# Patient Record
Sex: Male | Born: 1935 | Race: White | Hispanic: No | Marital: Married | Smoking: Never smoker
Health system: Southern US, Community
[De-identification: ages and names within clinical notes are randomized; demographics above are authoritative.]

## PROBLEM LIST (undated history)

## (undated) DIAGNOSIS — E871 Hypo-osmolality and hyponatremia: Secondary | ICD-10-CM

## (undated) DIAGNOSIS — I10 Essential (primary) hypertension: Secondary | ICD-10-CM

## (undated) DIAGNOSIS — H9190 Unspecified hearing loss, unspecified ear: Secondary | ICD-10-CM

## (undated) DIAGNOSIS — N390 Urinary tract infection, site not specified: Principal | ICD-10-CM

## (undated) DIAGNOSIS — N400 Enlarged prostate without lower urinary tract symptoms: Secondary | ICD-10-CM

## (undated) DIAGNOSIS — H409 Unspecified glaucoma: Secondary | ICD-10-CM

## (undated) DIAGNOSIS — R42 Dizziness and giddiness: Secondary | ICD-10-CM

## (undated) DIAGNOSIS — E785 Hyperlipidemia, unspecified: Secondary | ICD-10-CM

## (undated) DIAGNOSIS — C4490 Unspecified malignant neoplasm of skin, unspecified: Secondary | ICD-10-CM

## (undated) DIAGNOSIS — G43909 Migraine, unspecified, not intractable, without status migrainosus: Secondary | ICD-10-CM

## (undated) DIAGNOSIS — N200 Calculus of kidney: Secondary | ICD-10-CM

## (undated) DIAGNOSIS — C689 Malignant neoplasm of urinary organ, unspecified: Secondary | ICD-10-CM

## (undated) DIAGNOSIS — J3489 Other specified disorders of nose and nasal sinuses: Secondary | ICD-10-CM

## (undated) DIAGNOSIS — G2581 Restless legs syndrome: Secondary | ICD-10-CM

## (undated) DIAGNOSIS — Z87442 Personal history of urinary calculi: Secondary | ICD-10-CM

## (undated) DIAGNOSIS — M1990 Unspecified osteoarthritis, unspecified site: Secondary | ICD-10-CM

## (undated) HISTORY — PX: EYE SURGERY: SHX253

## (undated) HISTORY — DX: Essential (primary) hypertension: I10

## (undated) HISTORY — PX: TONSILLECTOMY: SUR1361

## (undated) HISTORY — PX: OTHER SURGICAL HISTORY: SHX169

## (undated) HISTORY — PX: SPERMATOCELECTOMY: SHX2420

## (undated) HISTORY — DX: Hypo-osmolality and hyponatremia: E87.1

## (undated) HISTORY — DX: Calculus of kidney: N20.0

## (undated) HISTORY — PX: COLONOSCOPY: SHX174

## (undated) HISTORY — DX: Urinary tract infection, site not specified: N39.0

## (undated) HISTORY — DX: Benign prostatic hyperplasia without lower urinary tract symptoms: N40.0

---

## 2007-04-23 ENCOUNTER — Ambulatory Visit: Payer: Self-pay | Admitting: Gastroenterology

## 2010-02-06 ENCOUNTER — Ambulatory Visit: Payer: Self-pay | Admitting: Internal Medicine

## 2010-08-06 ENCOUNTER — Ambulatory Visit: Payer: Self-pay | Admitting: Internal Medicine

## 2010-09-03 ENCOUNTER — Ambulatory Visit: Payer: Self-pay | Admitting: Gastroenterology

## 2011-01-08 ENCOUNTER — Ambulatory Visit: Payer: Self-pay | Admitting: Ophthalmology

## 2011-01-08 DIAGNOSIS — I119 Hypertensive heart disease without heart failure: Secondary | ICD-10-CM

## 2011-01-21 ENCOUNTER — Ambulatory Visit: Payer: Self-pay | Admitting: Ophthalmology

## 2011-09-09 ENCOUNTER — Ambulatory Visit: Payer: Self-pay | Admitting: Ophthalmology

## 2011-09-09 LAB — POTASSIUM: Potassium: 3.9 mmol/L (ref 3.5–5.1)

## 2011-09-16 ENCOUNTER — Ambulatory Visit: Payer: Self-pay | Admitting: Ophthalmology

## 2012-04-27 ENCOUNTER — Ambulatory Visit: Payer: Self-pay | Admitting: Physician Assistant

## 2012-04-29 ENCOUNTER — Ambulatory Visit: Payer: Self-pay | Admitting: Internal Medicine

## 2012-06-01 ENCOUNTER — Ambulatory Visit: Payer: Self-pay | Admitting: Otolaryngology

## 2012-06-16 ENCOUNTER — Ambulatory Visit: Payer: Self-pay | Admitting: Otolaryngology

## 2012-06-17 LAB — PATHOLOGY REPORT

## 2013-03-20 ENCOUNTER — Ambulatory Visit: Payer: Self-pay | Admitting: Internal Medicine

## 2013-03-20 ENCOUNTER — Inpatient Hospital Stay: Payer: Self-pay | Admitting: Internal Medicine

## 2013-03-20 LAB — COMPREHENSIVE METABOLIC PANEL
ALBUMIN: 4.1 g/dL (ref 3.4–5.0)
ALK PHOS: 41 U/L — AB
ALT: 30 U/L (ref 12–78)
Anion Gap: 14 (ref 7–16)
BUN: 31 mg/dL — ABNORMAL HIGH (ref 7–18)
Bilirubin,Total: 0.8 mg/dL (ref 0.2–1.0)
CHLORIDE: 102 mmol/L (ref 98–107)
Calcium, Total: 9.7 mg/dL (ref 8.5–10.1)
Co2: 24 mmol/L (ref 21–32)
Creatinine: 3.3 mg/dL — ABNORMAL HIGH (ref 0.60–1.30)
EGFR (African American): 20 — ABNORMAL LOW
EGFR (Non-African Amer.): 17 — ABNORMAL LOW
GLUCOSE: 156 mg/dL — AB (ref 65–99)
Osmolality: 289 (ref 275–301)
Potassium: 4.6 mmol/L (ref 3.5–5.1)
SGOT(AST): 27 U/L (ref 15–37)
Sodium: 140 mmol/L (ref 136–145)
Total Protein: 7.3 g/dL (ref 6.4–8.2)

## 2013-03-20 LAB — CBC WITH DIFFERENTIAL/PLATELET
BASOS ABS: 0.1 10*3/uL (ref 0.0–0.1)
Basophil %: 0.3 %
Eosinophil #: 0 10*3/uL (ref 0.0–0.7)
Eosinophil %: 0 %
HCT: 50.4 % (ref 40.0–52.0)
HGB: 16.6 g/dL (ref 13.0–18.0)
Lymphocyte #: 0.6 10*3/uL — ABNORMAL LOW (ref 1.0–3.6)
Lymphocyte %: 3.2 %
MCH: 30 pg (ref 26.0–34.0)
MCHC: 33 g/dL (ref 32.0–36.0)
MCV: 91 fL (ref 80–100)
Monocyte #: 0.9 x10 3/mm (ref 0.2–1.0)
Monocyte %: 4.8 %
NEUTROS ABS: 16.6 10*3/uL — AB (ref 1.4–6.5)
NEUTROS PCT: 91.7 %
Platelet: 256 10*3/uL (ref 150–440)
RBC: 5.54 10*6/uL (ref 4.40–5.90)
RDW: 13.3 % (ref 11.5–14.5)
WBC: 18.1 10*3/uL — AB (ref 3.8–10.6)

## 2013-03-21 LAB — URINALYSIS, COMPLETE
Bilirubin,UR: NEGATIVE
Blood: NEGATIVE
Glucose,UR: NEGATIVE mg/dL (ref 0–75)
Ketone: NEGATIVE
Nitrite: NEGATIVE
Ph: 5 (ref 4.5–8.0)
Protein: NEGATIVE
SPECIFIC GRAVITY: 1.017 (ref 1.003–1.030)
WBC UR: 69 /HPF (ref 0–5)

## 2013-03-21 LAB — BASIC METABOLIC PANEL
ANION GAP: 7 (ref 7–16)
BUN: 44 mg/dL — AB (ref 7–18)
Calcium, Total: 7.8 mg/dL — ABNORMAL LOW (ref 8.5–10.1)
Chloride: 104 mmol/L (ref 98–107)
Co2: 23 mmol/L (ref 21–32)
Creatinine: 4.2 mg/dL — ABNORMAL HIGH (ref 0.60–1.30)
EGFR (Non-African Amer.): 13 — ABNORMAL LOW
GFR CALC AF AMER: 15 — AB
GLUCOSE: 121 mg/dL — AB (ref 65–99)
OSMOLALITY: 281 (ref 275–301)
Potassium: 3.8 mmol/L (ref 3.5–5.1)
SODIUM: 134 mmol/L — AB (ref 136–145)

## 2013-03-21 LAB — CBC WITH DIFFERENTIAL/PLATELET
BASOS ABS: 0 10*3/uL (ref 0.0–0.1)
BASOS PCT: 0.2 %
Eosinophil #: 0 10*3/uL (ref 0.0–0.7)
Eosinophil %: 0 %
HCT: 39 % — ABNORMAL LOW (ref 40.0–52.0)
HGB: 13.5 g/dL (ref 13.0–18.0)
Lymphocyte #: 0.6 10*3/uL — ABNORMAL LOW (ref 1.0–3.6)
Lymphocyte %: 6.2 %
MCH: 31 pg (ref 26.0–34.0)
MCHC: 34.7 g/dL (ref 32.0–36.0)
MCV: 89 fL (ref 80–100)
Monocyte #: 0.8 x10 3/mm (ref 0.2–1.0)
Monocyte %: 8.5 %
NEUTROS PCT: 85.1 %
Neutrophil #: 8.2 10*3/uL — ABNORMAL HIGH (ref 1.4–6.5)
Platelet: 211 10*3/uL (ref 150–440)
RBC: 4.37 10*6/uL — ABNORMAL LOW (ref 4.40–5.90)
RDW: 13.3 % (ref 11.5–14.5)
WBC: 9.7 10*3/uL (ref 3.8–10.6)

## 2013-03-21 LAB — CLOSTRIDIUM DIFFICILE(ARMC)

## 2013-03-21 LAB — MAGNESIUM: Magnesium: 1.9 mg/dL

## 2013-03-21 LAB — WBCS, STOOL

## 2013-03-22 LAB — COMPREHENSIVE METABOLIC PANEL
ALT: 28 U/L (ref 12–78)
AST: 40 U/L — AB (ref 15–37)
Albumin: 2.7 g/dL — ABNORMAL LOW (ref 3.4–5.0)
Alkaline Phosphatase: 32 U/L — ABNORMAL LOW
Anion Gap: 6 — ABNORMAL LOW (ref 7–16)
BUN: 36 mg/dL — ABNORMAL HIGH (ref 7–18)
Bilirubin,Total: 0.3 mg/dL (ref 0.2–1.0)
CHLORIDE: 106 mmol/L (ref 98–107)
CO2: 22 mmol/L (ref 21–32)
CREATININE: 2.63 mg/dL — AB (ref 0.60–1.30)
Calcium, Total: 7.6 mg/dL — ABNORMAL LOW (ref 8.5–10.1)
EGFR (Non-African Amer.): 22 — ABNORMAL LOW
GFR CALC AF AMER: 26 — AB
GLUCOSE: 97 mg/dL (ref 65–99)
OSMOLALITY: 276 (ref 275–301)
POTASSIUM: 3.5 mmol/L (ref 3.5–5.1)
Sodium: 134 mmol/L — ABNORMAL LOW (ref 136–145)
Total Protein: 5.5 g/dL — ABNORMAL LOW (ref 6.4–8.2)

## 2013-03-22 LAB — URINALYSIS, COMPLETE
BACTERIA: NONE SEEN
BILIRUBIN, UR: NEGATIVE
BLOOD: NEGATIVE
GLUCOSE, UR: NEGATIVE mg/dL (ref 0–75)
KETONE: NEGATIVE
LEUKOCYTE ESTERASE: NEGATIVE
Nitrite: NEGATIVE
PH: 5 (ref 4.5–8.0)
Protein: NEGATIVE
RBC,UR: NONE SEEN /HPF (ref 0–5)
Specific Gravity: 1.004 (ref 1.003–1.030)
Squamous Epithelial: NONE SEEN
WBC UR: 1 /HPF (ref 0–5)

## 2013-03-22 LAB — CBC WITH DIFFERENTIAL/PLATELET
BASOS PCT: 0.3 %
Basophil #: 0 10*3/uL (ref 0.0–0.1)
EOS PCT: 1.5 %
Eosinophil #: 0.1 10*3/uL (ref 0.0–0.7)
HCT: 37 % — ABNORMAL LOW (ref 40.0–52.0)
HGB: 12.6 g/dL — AB (ref 13.0–18.0)
LYMPHS ABS: 1.2 10*3/uL (ref 1.0–3.6)
Lymphocyte %: 15.9 %
MCH: 30.1 pg (ref 26.0–34.0)
MCHC: 34.1 g/dL (ref 32.0–36.0)
MCV: 88 fL (ref 80–100)
MONOS PCT: 14 %
Monocyte #: 1.1 x10 3/mm — ABNORMAL HIGH (ref 0.2–1.0)
Neutrophil #: 5.2 10*3/uL (ref 1.4–6.5)
Neutrophil %: 68.3 %
Platelet: 185 10*3/uL (ref 150–440)
RBC: 4.19 10*6/uL — ABNORMAL LOW (ref 4.40–5.90)
RDW: 13.3 % (ref 11.5–14.5)
WBC: 7.6 10*3/uL (ref 3.8–10.6)

## 2013-03-22 LAB — PROTEIN / CREATININE RATIO, URINE
Creatinine, Urine: 58.8 mg/dL (ref 30.0–125.0)
Protein, Random Urine: <5 mg/dL — ABNORMAL LOW (ref 0–12)

## 2013-03-22 LAB — PROTEIN ELECTROPHORESIS(ARMC)

## 2013-03-22 LAB — STOOL CULTURE

## 2013-03-23 LAB — BASIC METABOLIC PANEL
Anion Gap: 5 — ABNORMAL LOW (ref 7–16)
BUN: 24 mg/dL — ABNORMAL HIGH (ref 7–18)
CHLORIDE: 111 mmol/L — AB (ref 98–107)
CO2: 24 mmol/L (ref 21–32)
CREATININE: 1.79 mg/dL — AB (ref 0.60–1.30)
Calcium, Total: 8 mg/dL — ABNORMAL LOW (ref 8.5–10.1)
EGFR (Non-African Amer.): 35 — ABNORMAL LOW
GFR CALC AF AMER: 41 — AB
Glucose: 98 mg/dL (ref 65–99)
Osmolality: 283 (ref 275–301)
POTASSIUM: 3.8 mmol/L (ref 3.5–5.1)
SODIUM: 140 mmol/L (ref 136–145)

## 2013-03-23 LAB — CBC WITH DIFFERENTIAL/PLATELET
BASOS PCT: 0.4 %
Basophil #: 0 10*3/uL (ref 0.0–0.1)
Eosinophil #: 0.1 10*3/uL (ref 0.0–0.7)
Eosinophil %: 2.5 %
HCT: 34.2 % — AB (ref 40.0–52.0)
HGB: 11.6 g/dL — AB (ref 13.0–18.0)
Lymphocyte #: 1.2 10*3/uL (ref 1.0–3.6)
Lymphocyte %: 24.6 %
MCH: 30.1 pg (ref 26.0–34.0)
MCHC: 34.1 g/dL (ref 32.0–36.0)
MCV: 88 fL (ref 80–100)
MONOS PCT: 17 %
Monocyte #: 0.8 x10 3/mm (ref 0.2–1.0)
NEUTROS ABS: 2.8 10*3/uL (ref 1.4–6.5)
Neutrophil %: 55.5 %
PLATELETS: 175 10*3/uL (ref 150–440)
RBC: 3.87 10*6/uL — AB (ref 4.40–5.90)
RDW: 13.2 % (ref 11.5–14.5)
WBC: 5 10*3/uL (ref 3.8–10.6)

## 2013-03-24 LAB — UR PROT ELECTROPHORESIS, URINE RANDOM

## 2013-08-23 DIAGNOSIS — N400 Enlarged prostate without lower urinary tract symptoms: Secondary | ICD-10-CM | POA: Insufficient documentation

## 2013-08-23 DIAGNOSIS — R42 Dizziness and giddiness: Secondary | ICD-10-CM | POA: Insufficient documentation

## 2013-08-23 DIAGNOSIS — Z8669 Personal history of other diseases of the nervous system and sense organs: Secondary | ICD-10-CM | POA: Insufficient documentation

## 2013-08-23 DIAGNOSIS — Z9109 Other allergy status, other than to drugs and biological substances: Secondary | ICD-10-CM | POA: Insufficient documentation

## 2013-08-23 DIAGNOSIS — N200 Calculus of kidney: Secondary | ICD-10-CM

## 2013-08-23 HISTORY — DX: Benign prostatic hyperplasia without lower urinary tract symptoms: N40.0

## 2013-08-23 HISTORY — DX: Calculus of kidney: N20.0

## 2013-10-07 DIAGNOSIS — Z86010 Personal history of colonic polyps: Secondary | ICD-10-CM | POA: Insufficient documentation

## 2013-10-31 ENCOUNTER — Ambulatory Visit: Payer: Self-pay | Admitting: Gastroenterology

## 2013-10-31 LAB — PROTIME-INR
INR: 1
PROTHROMBIN TIME: 12.6 s (ref 11.5–14.7)

## 2013-11-01 LAB — PATHOLOGY REPORT

## 2013-11-24 ENCOUNTER — Ambulatory Visit: Payer: Self-pay | Admitting: Internal Medicine

## 2014-06-30 NOTE — Op Note (Signed)
PATIENT NAME:  Anthony Gonzales, Anthony Gonzales MR#:  [id number] DATE OF BIRTH:  [birth year]  DATE OF PROCEDURE:  06/16/2012  PREOPERATIVE DIAGNOSIS: Left thyroid cystic nodule.   POSTOPERATIVE DIAGNOSIS: Left thyroid cystic nodule.   PROCEDURE: Excision of left thyroid cystic nodule.   SURGEON: Malon Kindle, MD  ASSISTANT: Beverly Gust, MD   ANESTHESIA: General endotracheal.   INDICATIONS: The patient with a large cystic nodule of the left thyroid gland.   FINDINGS: There is about a 7 cm multiloculated fluid-filled cyst involving the lower pole of the left thyroid gland. It was well-demarcated from the thyroid tissue itself. The remainder of the thyroid tissue was free of any palpable nodules.   COMPLICATIONS: None.   DESCRIPTION OF PROCEDURE: After obtaining informed consent the patient was taken to the operating room and placed in the supine position. He was intubated with a tube that was wrapped with a nerve monitor, and direct visualization of the position of the monitor electrodes was obtained after intubation. After induction of general endotracheal anesthesia, the patient's neck was injected with 1% lidocaine with epinephrine 1:200,000 and prepped and draped in the usual sterile fashion. A 15-blade was used to incise the skin and this was carried down through the platysma with the Bovie. The strap muscles were divided in the midline and retracted laterally. The thyroid cyst was immediately identified and carefully dissected out, predominantly with blunt dissection. It was quite mobile and freed up from surrounding tissues easily. There was a vessel, possibly the inferior pole of the thyroid vessels traversing over the cyst. These were divided with the Harmonic scalpel, and some other loose fascial attachments were divided with the Harmonic scalpel. The cyst was huge and would not easily come out of the incision we had made, so approximately 50 mL of clear, golden fluid was aspirated from the cyst  to allow it to be more easily removed. The cyst was then carefully dissected away from surrounding tissues with a peanut and residual fine attachments divided with a Harmonic scalpel. A couple of structures that were suspicious for nerves were stimulated, but did not stimulate. None of these structures were divided; they were just gently swept out of the way. The cyst was removed and sent for pathology. The wound was irrigated with saline and a #10 TLS drain placed. The strap muscles were  reapproximated with 4-0 Vicryl suture followed by closure of the subcutaneous tissues and platysma with 4-0 Vicryl. The skin was closed with 3-0 Prolene suture and the TLS drain secured into position with 3-0 Prolene. The patient was then returned to the anesthesiologist for awakening. He was awakened and taken to the recovery room in good condition postoperatively.      ____________________________ Sammuel Hines. Richardson Landry, MD psb:dm D: 06/16/2012 08:29:54 ET T: 06/16/2012 09:35:14 ET JOB#: [id number]  cc: Sammuel Hines. Richardson Landry, MD, <Dictator> Riley Nearing MD ELECTRONICALLY SIGNED 06/23/2012 8:35

## 2014-07-01 NOTE — Discharge Summary (Signed)
PATIENT NAME:  Anthony Gonzales, Anthony Gonzales MR#:  [id number] DATE OF BIRTH:  07-May-1931  DATE OF ADMISSION:  03/20/2013 DATE OF DISCHARGE:  03/23/2013  REASON FOR ADMISSION: Nausea, vomiting with diarrhea.   HISTORY OF PRESENT ILLNESS: Please see the dictated HPI done by Dr. Tressia Miners on 03/20/2013.   PAST MEDICAL HISTORY:  1. Hyperlipidemia.  2. Benign hypertension.  3. Glaucoma.  4. Colonic polyps.  5. History of basal cell skin cancer.   MEDICATIONS ON ADMISSION: Please see admission note.   ALLERGIES: DEMEROL, LIPITOR AND LUMIGAN.   SOCIAL HISTORY, FAMILY HISTORY AND REVIEW OF SYSTEMS: As per admission note.   PHYSICAL EXAMINATION:  GENERAL: The patient was in no acute distress.  VITAL SIGNS: Remarkable for a temperature of 100.4 with a heart rate of 108 and a blood pressure of 94/52.  HEENT: Unremarkable.  NECK: Supple without JVD.  LUNGS: Clear.  CARDIAC: Regular rate and rhythm with a normal S1 and S2.  ABDOMEN: Soft, nontender.  EXTREMITIES: Without edema.  NEUROLOGIC: Grossly nonfocal.   LABORATORY DATA: Remarkable for a white count of 18.1 with a BUN of 31 and a creatinine of 3.3.   HOSPITAL COURSE: The patient was admitted with intractable nausea, vomiting and diarrhea with dehydration, hypotension and acute renal failure. The patient was placed on IV fluids. He was seen in consultation by GI and nephrology. Renal ultrasound was unremarkable. It was felt that his renal failure was likely due from his nausea, vomiting and dehydration. The patient's stool studies were unremarkable. His diarrhea was treated symptomatically with Lomotil. His nausea, vomiting and diarrhea improved. He was started on a clear liquid diet which was advanced. He tolerated his diet well. His renal function improved. By 03/23/2013, the patient was stable and ready for discharge.   DISCHARGE DIAGNOSES:  1. Acute renal failure.  2. Dehydration.  3. Hypotension.  4. Presumed gastroenteritis.    DISCHARGE MEDICATIONS:  1. Multivitamin 1 p.o. daily.  2. Flonase 2 puffs in each nostril daily.  3. Losartan 50 mg p.o. daily.  4. Lomotil 1 p.o. q.6 hours p.r.n. diarrhea.  5. Zofran 4 mg p.o. q.4 hours p.r.n. nausea and vomiting.  6. Fenofibrate 145 mg p.o. daily.  7. Aspirin 81 mg p.o. daily.  8. Protonix 40 mg p.o. daily.   FOLLOWUP PLANS AND APPOINTMENTS: The patient was discharged on a low sodium diet. He will follow up with me within 1 week's time, sooner if needed.   ____________________________ Leonie Douglas Doy Hutching, MD jds:lb D: 03/29/2013 11:50:10 ET T: 03/29/2013 12:07:30 ET JOB#: [id number]  cc: Leonie Douglas. Doy Hutching, MD, <Dictator> JEFFREY Lennice Sites MD ELECTRONICALLY SIGNED 03/30/2013 8:12

## 2014-07-01 NOTE — H&P (Signed)
PATIENT NAME:  Anthony Gonzales, Anthony Gonzales MR#:  [id number] DATE OF BIRTH:  02-Feb-1932  DATE OF ADMISSION:  03/20/2013  ADMITTING PHYSICIAN:  Dr. Gladstone Lighter   PRIMARY CARE PHYSICIAN: Dr. Fulton Reek  CHIEF COMPLAINT: Nausea, vomiting, diarrhea.   HISTORY OF PRESENT ILLNESS: Anthony Gonzales is a very pleasant, 81 year old Caucasian male, with past medical history significant for hypertension, hyperlipidemia, colonic polyps and glaucoma. Was sent in from [facility] secondary to worsening nausea, vomiting, diarrhea, with hypertension and acute renal failure. The patient said he had chills a couple of days ago, felt weak on Thursday, but was doing fine over the last couple of days. He actually went bowling yesterday afternoon, did not eat anything unusual. No recent travel or exposure to sick contacts. He came home, ate his supper, and immediately after he started throwing up. Since then, all night long he has been having vomiting and diarrhea every 15 to 30 minutes, with some abdominal cramping. He denies seeing any blood or mucus in the stool. This morning he went to urgent care, he was noted to have hypertension, with systolic in the 04V and diastolic in the 40J blood pressure, and his creatinine increased up to 3.3, so he was sent over for direct admission. The patient did receive 1 liter of fluids in the urgent care, and feels a little bit better. He continues to have clear vomitus with a greenish material in it. However, he did not have any further bowel movements here. He denies eating any frozen or uncooked food last night before his vomiting started.   PAST MEDICAL HISTORY: 1.  Hypertension.  2.  Hyperlipidemia.  3.  Glaucoma.  4.  Colonic polyps.   PAST SURGICAL HISTORY: 1.  Colonoscopy for polypectomy.  2.  Bilateral cataract surgery.  3.  Thyroid cyst removal.  4.  Basal cell carcinoma excisions on his face.   ALLERGIES TO MEDICATIONS: Intolerant to DEMEROL, LIPITOR, LUMIGAN.    HOME MEDICATIONS:    1.  Benazepril/HCTZ 20 mg/12.5 mg 1 tablet p.o. daily.  2.  Dorzolamide/Timolol ophthalmic solution, 1 drop to left eye twice a day.  3.  Fenofibrate 145 mg p.o. daily in the morning.  4.  Multivitamin 1 tablet p.o. daily.  5.  Metamucil p.o. b.i.d. for constipation.   SOCIAL HISTORY: Lives at home with his wife. No history of any smoking or alcohol use.   FAMILY HISTORY: Hypertension, heart disease runs in the family.   REVIEW OF SYSTEMS:  CONSTITUTIONAL: No fever, fatigue, weakness noted.  EYES: No blurred vision, double vision, pain, inflammation. Possible history of glaucoma, and also cataracts taken out.  ENT: No tinnitus, ear pain. Positive for hearing loss, and uses hearing aids. No discharge or dysphagia noted.  RESPIRATORY: No cough, wheeze, hemoptysis or COPD.  CARDIOVASCULAR: No chest pain, orthopnea, edema, arrhythmia, palpitations or syncope.  GASTROINTESTINAL: Positive for nausea, vomiting and diarrhea. No abdominal pain. No hematemesis or melena.  GENITOURINARY: No dysuria, hematuria, renal calculus, frequency or incontinence.  ENDOCRINE: No polyuria, nocturia, thyroid problems, heat or cold intolerance.  HEMATOLOGY: No anemia, easy bruising or bleeding.  SKIN: No acne, rash or lesions.  MUSCULOSKELETAL: No neck, back, shoulder pain , arthritis or gout NEUROLOGIC: No numbness, weakness, CVA, TIA, or seizures.  PSYCHOLOGICAL: No anxiety, insomnia or depression.   PHYSICAL EXAMINATION: VITAL SIGNS: Temperature 100.4 degrees Fahrenheit, pulse 108, respirations 20, blood pressure 94/52, pulse ox 93% on room air.  GENERAL EXAM: Well-built, well-nourished male lying in bed, not in any  acute distress.  HEENT: Normocephalic, atraumatic. Pupils equal, round, reacting to light. Anicteric sclerae. Extraocular movements intact. Oropharynx clear, without erythema, mass or exudates.  NECK: Supple. No thyromegaly, JVD or carotid bruits. No lymphadenopathy.   LUNGS: Moving air bilaterally. No wheeze or crackles. No use of accessory muscles for breathing.  CARDIOVASCULAR: S1, S2. Regular rate and rhythm. No murmurs, rubs or gallops.  ABDOMEN: Soft, nontender, nondistended. No hepatosplenomegaly. Normal bowel sounds.  EXTREMITIES: No pedal edema. No clubbing or cyanosis. There are 2+ dorsalis pedis pulses palpable bilaterally.  SKIN: No acne, rash or lesions.  LYMPHATICS: No cervical or inguinal lymphadenopathy.  NEUROLOGIC: Cranial nerves II through XII remain intact. No focal motor or sensory deficits.  PSYCHOLOGICAL: The patient is alert, oriented x 3.   LAB DATA: WBC is 18.1, hemoglobin 16.6, hematocrit 50.4, platelet count is 256. Sodium 140, potassium 4.6, chloride 102, bicarb 24, BUN 31, creatinine 3.3, glucose 156, and calcium  9.7.  ALT 30, AST 27, alk phos 41, total bili 0.8, and albumin of 4.1. Chest x-ray revealing clear lung fields, no acute cardiopulmonary disease at this time. Stool studies and urinalysis are pending.   ASSESSMENT AND PLAN: This is a 81 year old male with history of hypertension, hyperlipidemia. Admitted for sudden onset of nausea, vomiting, diarrhea associated with hypertension and tachycardia, and also noted to have acute renal failure.   1. Hypotension. Likely hypovolemic, from GI losses. Continue IV fluids and monitor.   2.  Acute onset of nausea, vomiting, diarrhea. Likely could be acute viral gastroenteritis. Stool studies have been ordered. So far, no source of infection identified. Leukocytosis was also noted. Will start clear liquid diet and advance as tolerated. Since there is no abdominal pain, will hold off on ordering any CT or abdominal imaging. We will continue IV fluids and Zofran, and monitor.   3.  Acute renal failure, likely acute tubular necrosis from hypertension. Continue IV fluids and monitor. If no improvement, will get a renal ultrasound.   4.  Hypertension. Hold his blood pressure medications,  as his blood pressure is low.   5.  Hyperlipidemia. Will hold his medication, as he is very nauseated at this time.   6.  Glaucoma. Continue eye drops.   7. Gastroesophageal reflux disease symptoms, associated with his constant nausea, vomiting. Will start on IV Protonix.  Code status of the patient is FULL CODE.   Time spent on admission is 50 minutes.    ____________________________ Gladstone Lighter, MD rk:mr D: 03/20/2013 14:23:41 ET T: 03/20/2013 19:35:47 ET JOB#: [id number]  cc: Gladstone Lighter, MD, <Dictator> Leonie Douglas. Doy Hutching, MD  Gladstone Lighter MD ELECTRONICALLY SIGNED 03/22/2013 14:17

## 2014-07-01 NOTE — Consult Note (Signed)
Chief Complaint:  Subjective/Chief Complaint Pt has had 1 loose stool this AM but noticed it was very dark.  Denies abdominal pain. Hgb down to 12.6.  Creat down to 2.63.  Pt states another person sick that had the same meal 2 days ago.   VITAL SIGNS/ANCILLARY NOTES: **Vital Signs.:   13-Jan-15 04:39  Vital Signs Type Routine  Temperature Temperature (F) 98.4  Celsius 36.8  Temperature Source oral  Pulse Pulse 63  Respirations Respirations 18  Systolic BP Systolic BP 852  Diastolic BP (mmHg) Diastolic BP (mmHg) 68  Mean BP 82  Pulse Ox % Pulse Ox % 92  Pulse Ox Activity Level  At rest  Oxygen Delivery Room Air/ 21 %   Brief Assessment:  GEN well developed, well nourished, no acute distress, A/Ox3.   Cardiac Regular   Respiratory normal resp effort   Gastrointestinal Normal   Gastrointestinal details normal Soft  Nontender  Nondistended  Bowel sounds normal   EXTR negative cyanosis/clubbing, negative edema   Lab Results: Hepatic:  13-Jan-15 04:51   Bilirubin, Total 0.3  Alkaline Phosphatase  32 (45-117 NOTE: New Reference Range 01/28/13)  SGPT (ALT) 28  SGOT (AST)  40  Total Protein, Serum  5.5  Albumin, Serum  2.7  Lab:  13-Jan-15 02:00   O2 Saturation (Pulse Ox) 93 (Result(s) reported on 22 Mar 2013 at 03:16AM.)  Routine Chem:  13-Jan-15 04:51   Glucose, Serum 97  BUN  36  Creatinine (comp)  2.63  Sodium, Serum  134  Potassium, Serum 3.5  Chloride, Serum 106  CO2, Serum 22  Calcium (Total), Serum  7.6  Osmolality (calc) 276  eGFR (African American)  26  eGFR (Non-African American)  22 (eGFR values <50m/min/1.73 m2 may be an indication of chronic kidney disease (CKD). Calculated eGFR is useful in patients with stable renal function. The eGFR calculation will not be reliable in acutely ill patients when serum creatinine is changing rapidly. It is not useful in  patients on dialysis. The eGFR calculation may not be applicable to patients at the low and  high extremes of body sizes, pregnant women, and vegetarians.)  Anion Gap  6    09:38   Result Comment PROTEIN/CREATININE RATIO - UNABLE TO CALCULATE VALUE DUE TO NON-  - NUMERIC VALUE WITHIN THE CALCULATION.  Result(s) reported on 22 Mar 2013 at 10:46AM.  Misc Urine Chem:  13-Jan-15 09:38   Creatinine, Urine 58.8  Protein, Random Urine  < 5  Protein/Creat Ratio (comp) SEE COMMENT  Routine UA:  13-Jan-15 09:38   Color (UA) Straw  Clarity (UA) Clear  Glucose (UA) Negative  Bilirubin (UA) Negative  Ketones (UA) Negative  Specific Gravity (UA) 1.004  Blood (UA) Negative  pH (UA) 5.0  Protein (UA) Negative  Nitrite (UA) Negative  Leukocyte Esterase (UA) Negative (Result(s) reported on 22 Mar 2013 at 10:00AM.)  RBC (UA) NONE SEEN  WBC (UA) 1 /HPF  Bacteria (UA) NONE SEEN  Epithelial Cells (UA) NONE SEEN  Mucous (UA) PRESENT (Result(s) reported on 22 Mar 2013 at 10:00AM.)  Routine Hem:  13-Jan-15 04:51   WBC (CBC) 7.6  RBC (CBC)  4.19  Hemoglobin (CBC)  12.6  Hematocrit (CBC)  37.0  Platelet Count (CBC) 185  MCV 88  MCH 30.1  MCHC 34.1  RDW 13.3  Neutrophil % 68.3  Lymphocyte % 15.9  Monocyte % 14.0  Eosinophil % 1.5  Basophil % 0.3  Neutrophil # 5.2  Lymphocyte # 1.2  Monocyte #  1.1  Eosinophil # 0.1  Basophil # 0.0 (Result(s) reported on 22 Mar 2013 at 05:48AM.)   Assessment/Plan:  Assessment/Plan:  Assessment Acute N/V/D:  Resolving.  Likely acute gastroenteritis.  Stool studies benign so far. Dark stools ?melena: Hgb has dropped a bit.  Will hemoccult stools.  ?Mallory Weiss tear from N/V vs. PUD if heme positive. ARF: improving Hx colonic polyps   Plan 1) Continue supportive measures 2) FU CBC in AM 3) hemoccult stools 4) Continue PPI Please call if you have any questions or concerns   Electronic Signatures: Andria Meuse (NP)  (Signed 13-Jan-15 13:51)  Authored: Chief Complaint, VITAL SIGNS/ANCILLARY NOTES, Brief Assessment, Lab Results,  Assessment/Plan   Last Updated: 13-Jan-15 13:51 by Andria Meuse (NP)

## 2014-07-01 NOTE — Consult Note (Signed)
PATIENT NAME:  Anthony Gonzales, Anthony Gonzales MR#:  [id number] DATE OF BIRTH:  08-26-1931  DATE OF CONSULTATION:  03/21/2013  REFERRING PHYSICIAN:  Leonie Douglas. Doy Hutching, MD CONSULTING PHYSICIAN:  Andria Meuse, NP  PRIMARY CARE PHYSICIAN:  Leonie Douglas. Doy Hutching, MD  CONSULTING GASTROENTEROLOGIST: Lucilla Lame, MD  PRIMARY GASTROENTEROLOGIST: Lollie Sails, MD  REASON FOR CONSULTATION: Acute nausea, vomiting, diarrhea.   HISTORY OF PRESENT ILLNESS: Mr. Nippert is a very pleasant 81 year old Caucasian male who was in his usual state of health. He went out to eat at [employer] and had a roast beef sandwich 2 days ago. Later on the evening, he had some he had some beans.  He immediately started vomiting. He continued to have profuse nausea, vomiting and watery diarrhea every 15 to 30 minutes. He presented the following day to [facility] urgent care and was found to be hypertensive and had acute renal failure. He did have chills a few days ago, denies any fever. On admission, his white blood cell count was 18.1 with neutrophil count of 16.6%. His C. diff was negative. He has not had any further nausea or vomiting. He continues to have loose watery stools. He denies any rectal bleeding, melena or mucus in his stools. He was admitted with acute renal failure, creatinine of 3.3 upon admission, 4.2 today. He has not traveled out of the country. He denies any new medications, denies any recent antibiotics or denies any ill contacts. He denies any history of acid reflux, dysphagia, odynophagia or weight loss prior to the onset of his symptoms. He was started on Zofran p.r.n. and Protonix 40 mg. He is on a clear liquid diet. A complete set of stool studies has been ordered and are pending. His last colonoscopy was 09/03/2010 by Dr. Gustavo Lah. He tells me he has colonoscopy every 3 years for a history of tubular adenomas. On last colonoscopy he had 2 tubular adenomas removed, 1 from his mid sigmoid and another from his ileocecal  valve. He also had internal hemorrhoids and sigmoid, descending and transverse colon diverticulosis. On previous colonoscopy he had multiple tubular adenomas removed in February of 2009.   PAST MEDICAL AND SURGICAL HISTORY: Hypertension, hyperlipidemia, tubular adenomas of the colon in 2012 and 2009 followed by Dr. Gustavo Lah, bilateral cataracts, thyroid cyst, basal cell carcinoma, glaucoma.   MEDICATIONS PRIOR TO ADMISSION: Aspirin 81 mg daily, benazepril plus HCTZ 20/12.5 mg q.a.m., dorzolamide/timolol ophthalmic 2.23%/0.68% one drop to left eye b.i.d., fenofibrate 145 mg daily, fish oil 2 grams daily, fluticasone 50 mcg nasal spray daily, Metamucil fiber 3.4 grams/5.4 grams b.i.d., multivitamin daily, Tylenol 650 mg 2 tablets q.8 hours.   ALLERGIES: DEMEROL NAUSEA, VOMITING, DIARRHEA. LIPITOR JOINT PAIN. LUMIGAN UNKNOWN REACTION.   FAMILY HISTORY: There is family history of coronary disease and heart disease. There is no known family history of carcinoma of the liver or chronic GI problems.   SOCIAL HISTORY: He denies any tobacco, alcohol or illicit drug use. He is married. He does not have any children.   REVIEW OF SYSTEMS:   CONSTITUTIONAL: He has had some weakness and fatigue. Otherwise see HPI, otherwise, negative 12-point review of systems.   PHYSICAL EXAMINATION: VITAL SIGNS: [place] 98, pulse 75, respirations 18, blood pressure 98/57, O2 sat 92% on room air.  GENERAL: He is a very pleasant, alert, oriented and cooperative Caucasian male in no acute distress.  HEENT: Sclerae clear, anicteric. Conjunctivae pink. Oropharynx pink and moist without any lesions.  NECK: Supple without any mass or thyromegaly.  CHEST:  Heart regular rate and rhythm with normal S1, S2 without murmurs, clicks, rubs or gallops.  LUNGS: Clear to auscultation bilaterally.  ABDOMEN: Positive bowel sounds x 4. No bruits auscultated. Abdomen is soft, nontender, nondistended, without palpable mass or hepatosplenomegaly. No  rebound tenderness or guarding.  EXTREMITIES: Without clubbing or edema.  SKIN: Pink, warm and dry without any rash or jaundice.  NEUROLOGIC: Grossly intact.  PSYCHIATRIC: He is alert, cooperative, normal mood and affect.  MUSCULOSKELETAL: Good equal movement and strength bilaterally.   LABORATORY STUDIES: Glucose 121, BUN 44, creatinine 4.2, sodium 134, calcium 7.8, otherwise normal met-7. Alkaline phosphatase 41, otherwise normal LFTs. Hematocrit 39, RBCs 4.37, otherwise normal CBC today.   IMPRESSION: Mr. Hardenbrook is a very pleasant 81 year old Caucasian male admitted with acute nausea, vomiting and diarrhea that started less than 48 hours ago. He also has acute renal failure. Nausea and vomiting are resolving and he is still with watery diarrhea. His Clostridium difficile is negative. Complete set of stool studies are pending. I suspect foodborne illness versus gastroenteritis given leukocytosis which has now resolved and the patient is improving. Given the fact that he has significantly improved, would withhold empiric antibiotic therapy and his creatinine is 4.2 today. Should his condition decline, would have a low threshold for initiating antibiotic therapy but would need to renal adjust the dose. He has personal history of colonic tubular adenomas and is due for surveillance colonoscopy in June. I have discussed his care with Dr. Lucilla Lame.   PLAN: 1.  Agree with IV fluids, antiemetics and push p.o. fluids.  2.  Followup stool studies.  3.  Continue supportive measures.  4.  Colonoscopy June 2015 with Dr. Gustavo Lah unless diarrhea persists, then he will need a colonoscopy sooner.  I would like to thank you for allowing [place] to participate in the care of Mr. Dineen.   ____________________________ Andria Meuse, NP klj:cs D: 03/21/2013 18:51:20 ET T: 03/21/2013 19:21:58 ET JOB#: [id number]  cc: Andria Meuse, NP, <Dictator> Leonie Douglas. Doy Hutching, MD Andria Meuse  FNP ELECTRONICALLY SIGNED 03/23/2013 21:06

## 2014-07-01 NOTE — Consult Note (Signed)
Brief Consult Note: Diagnosis: N/V/D.   Patient was seen by consultant.   Comments: Mr. Anthony Gonzales is a very pleasant 81 y/o caucasian male admitted with acute profuse nausea, vomiting & diarrhea & that started less than 48 hrs ago.  He also had ARF.  Nausea & vomiting resolving, still with watery diarrhea.  C diff negative.  I suspect food-borne illness versus gastroenteritis given leukocytosis which has now resolved & pt improving.  Stool studies pending.  He has personal hx colonic tubular adenomas due for surveillance colonoscopy this year.  I have discussed with Dr Allen Norris.  Plan: 1) Agree with IVFs, antiemetics, push fluids 2) FU stool studies 3) supportive measures 4) colonoscopy June 2015 with Dr Gustavo Lah unless diarrhea persists, then sooner Thanks for consult.  Please see full dictated note. #[id number].  Electronic Signatures: Andria Meuse (NP)  (Signed 12-Jan-15 18:39)  Authored: Brief Consult Note   Last Updated: 12-Jan-15 18:39 by Andria Meuse (NP)

## 2014-07-02 NOTE — Op Note (Signed)
PATIENT NAME:  Anthony Gonzales, Anthony Gonzales MR#:  [id number] DATE OF BIRTH:  1931-08-29  DATE OF PROCEDURE:  09/16/2011  PREOPERATIVE DIAGNOSIS: Visually significant cataract of the right eye.   POSTOPERATIVE DIAGNOSIS: Visually significant cataract of the right eye.   OPERATIVE PROCEDURE: Cataract extraction by phacoemulsification with implant of intraocular lens to right eye.   SURGEON: Birder Robson, MD.   ANESTHESIA:  1. Managed anesthesia care.  2. Topical tetracaine drops followed by 2% Xylocaine jelly applied in the preoperative holding area.   COMPLICATIONS: None.   TECHNIQUE:  Four quadrant divide-and-conquer.   DESCRIPTION OF PROCEDURE: The patient was examined and consented in the preoperative holding area where the aforementioned topical anesthesia was applied to the right eye and then brought back to the Operating Room where the right eye was prepped and draped in the usual sterile ophthalmic fashion and a lid speculum was placed. A paracentesis was created with the side port blade and the anterior chamber was filled with viscoelastic. A near clear corneal incision was performed with the steel keratome. A continuous curvilinear capsulorrhexis was performed with a cystotome followed by the capsulorrhexis forceps. Hydrodissection and hydrodelineation were carried out with BSS on a blunt cannula. The lens was removed in a four quadrant divide-and-conquer technique and the remaining cortical material was removed with the irrigation-aspiration handpiece. The capsular bag was inflated with viscoelastic and the Technis ZCB00 19.0-diopter lens, serial number [id number] was placed in the capsular bag without complication. The remaining viscoelastic was removed from the eye with the irrigation-aspiration handpiece. The wounds were hydrated. The anterior chamber was flushed with Miostat and the eye was inflated to physiologic pressure. The wounds were found to be water tight. The eye was dressed with  Vigamox. The patient was given protective glasses to wear throughout the day and a shield with which to sleep tonight. The patient was also given drops with which to begin a drop regimen today and will follow-up with me in one day.   ____________________________ Livingston Diones. Drena Ham, MD wlp:cms D: 09/16/2011 12:34:49 ET T: 09/16/2011 13:01:58 ET JOB#: [id number]  cc: Mavrick L. Tailey Top, MD, <Dictator>  Livingston Diones Alante Tolan MD ELECTRONICALLY SIGNED 09/17/2011 9:58

## 2014-12-25 ENCOUNTER — Encounter: Payer: Self-pay | Admitting: Emergency Medicine

## 2014-12-25 ENCOUNTER — Ambulatory Visit
Admission: EM | Admit: 2014-12-25 | Discharge: 2014-12-25 | Disposition: A | Discharge status: Home / Self Care | Payer: PPO | Attending: Family Medicine | Admitting: Family Medicine

## 2014-12-25 DIAGNOSIS — J0110 Acute frontal sinusitis, unspecified: Secondary | ICD-10-CM | POA: Diagnosis not present

## 2014-12-25 DIAGNOSIS — N390 Urinary tract infection, site not specified: Secondary | ICD-10-CM

## 2014-12-25 DIAGNOSIS — J302 Other seasonal allergic rhinitis: Secondary | ICD-10-CM | POA: Diagnosis not present

## 2014-12-25 DIAGNOSIS — H6593 Unspecified nonsuppurative otitis media, bilateral: Secondary | ICD-10-CM

## 2014-12-25 HISTORY — DX: Essential (primary) hypertension: I10

## 2014-12-25 LAB — URINALYSIS COMPLETE WITH MICROSCOPIC (ARMC ONLY)
Bilirubin Urine: NEGATIVE
GLUCOSE, UA: NEGATIVE mg/dL
Ketones, ur: NEGATIVE mg/dL
Nitrite: POSITIVE — AB
Protein, ur: NEGATIVE mg/dL
SPECIFIC GRAVITY, URINE: 1.015 (ref 1.005–1.030)
pH: 7 (ref 5.0–8.0)

## 2014-12-25 MED ORDER — SULFAMETHOXAZOLE-TRIMETHOPRIM 400-80 MG PO TABS: 1.0000 | ORAL_TABLET | Freq: Two times a day (BID) | ORAL | Status: DC

## 2014-12-25 MED ORDER — MOMETASONE FUROATE 50 MCG/ACT NA SUSP: 2.0000 | Freq: Every day | NASAL | Status: DC

## 2014-12-25 MED ORDER — SULFAMETHOXAZOLE-TRIMETHOPRIM 800-160 MG PO TABS: 1.0000 | ORAL_TABLET | Freq: Two times a day (BID) | ORAL | Status: DC

## 2014-12-25 NOTE — ED Notes (Signed)
When patient was called back from the waiting room, and when he stood up and started walking patient fell to the floor and landed on his back.  Patient did not hit his head and no LOC.  Patient states "my leg went to sleep."  I was able to get the patient off the floor and assist him walking to his room to be triaged.  Patient denies any injury or pain at the this time. Gerarda Fraction, NP was notified of the incident.

## 2014-12-25 NOTE — ED Notes (Signed)
Patient c/o burning when urinating, fever, and HAs that started last night.  Patient also reports runny nose and cough.

## 2014-12-25 NOTE — ED Provider Notes (Signed)
CSN: [id number]     Arrival date & time 12/25/14  1144 History   First MD Initiated Contact with Patient 12/25/14 1312     Chief Complaint  Patient presents with  . Dysuria  . Fever   (Consider location/radiation/quality/duration/timing/severity/associated sxs/prior Treatment) HPI Comments: Married caucasian male here for evaluation of foul smelling urine, frequency (hourly worse at night), diarrhea yesterday, post nasal drip, Tmax 102, chills, headache, dizzyness with position changes.  Was working in yard cutting trees Saturday 15 oct for 8+ hours; the next day had 2 bouts of watery orange diarrhea at church that has resolved; worse than usual legs jumping while sleeping last night, mid back pain resolved now, decreased urine amounts and difficulty starting stream yesterday today better; Stood up to walk to exam room and right leg asleep tumbled in waiting room per patient and nurse denied injury stated he has a lot of experience with falls and rolling to not hurt himself;  PMHx BPH hasn't required long term antibiotics in many years  Patient is a 81 y.o. male presenting with dysuria and fever. The history is provided by the patient and the spouse.  Dysuria This is a new problem. The current episode started 2 days ago. The problem occurs hourly. The problem has not changed since onset.Associated symptoms include headaches. Pertinent negatives include no chest pain, no abdominal pain and no shortness of breath. The symptoms are aggravated by exertion and standing. Nothing relieves the symptoms. He has tried rest, food and water for the symptoms. The treatment provided no relief.  Fever Associated symptoms: chills, congestion, diarrhea, dysuria, headaches, myalgias and rhinorrhea   Associated symptoms: no chest pain, no confusion, no cough, no ear pain, no nausea, no rash, no sore throat and no vomiting     Past Medical History  Diagnosis Date  . Hypertension    History reviewed. No pertinent  past surgical history. History reviewed. No pertinent family history. Social History  Substance Use Topics  . Smoking status: Never Smoker   . Smokeless tobacco: None  . Alcohol Use: No    Review of Systems  Constitutional: Positive for fever, chills, diaphoresis and fatigue. Negative for activity change, appetite change and unexpected weight change.  HENT: Positive for congestion, hearing loss, postnasal drip and rhinorrhea. Negative for dental problem, drooling, ear discharge, ear pain, facial swelling, mouth sores, nosebleeds, sinus pressure, sneezing, sore throat, tinnitus, trouble swallowing and voice change.   Eyes: Negative for photophobia, pain, discharge, redness, itching and visual disturbance.  Respiratory: Negative for cough, choking, chest tightness, shortness of breath, wheezing and stridor.   Cardiovascular: Negative for chest pain, palpitations and leg swelling.  Gastrointestinal: Positive for diarrhea. Negative for nausea, vomiting, abdominal pain, constipation, blood in stool and abdominal distention.  Endocrine: Negative for cold intolerance and heat intolerance.  Genitourinary: Positive for dysuria, frequency, decreased urine volume and difficulty urinating. Negative for urgency, hematuria, flank pain, discharge, penile swelling, scrotal swelling, enuresis, genital sores, penile pain and testicular pain.  Musculoskeletal: Positive for myalgias and back pain. Negative for joint swelling, arthralgias, gait problem, neck pain and neck stiffness.  Skin: Negative for color change, pallor, rash and wound.  Allergic/Immunologic: Positive for environmental allergies. Negative for food allergies.  Neurological: Positive for dizziness, light-headedness and headaches. Negative for tremors, seizures, syncope, facial asymmetry, speech difficulty, weakness and numbness.  Hematological: Negative for adenopathy. Does not bruise/bleed easily.  Psychiatric/Behavioral: Positive for sleep  disturbance. Negative for behavioral problems, confusion and agitation.    Allergies  Demerol  Home Medications   Prior to Admission medications   Medication Sig Start Date End Date Taking? Authorizing Provider  benazepril-hydrochlorthiazide (LOTENSIN HCT) 20-12.5 MG tablet Take 1 tablet by mouth daily.   Yes Historical Provider, MD  Dorzolamide HCl-Timolol Mal PF 22.3-6.8 MG/ML SOLN Apply 1 drop to eye daily.   Yes Historical Provider, MD  fenofibrate 160 MG tablet Take 160 mg by mouth daily.   Yes Historical Provider, MD  timolol (BETIMOL) 0.25 % ophthalmic solution Place 1-2 drops into both eyes 2 (two) times daily.   Yes Historical Provider, MD  mometasone (NASONEX) 50 MCG/ACT nasal spray Place 2 sprays into the nose daily. 12/25/14   Olen Cordial, NP  sulfamethoxazole-trimethoprim (BACTRIM) 400-80 MG tablet Take 1 tablet by mouth 2 (two) times daily. 12/25/14   Olen Cordial, NP   Meds Ordered and Administered this Visit  Medications - No data to display  BP 150/65 mmHg  Pulse 90  Temp(Src) 100 F (37.8 C) (Tympanic)  Resp 16  Ht 6' (1.829 m)  Wt 194 lb (87.998 kg)  BMI 26.31 kg/m2  SpO2 99% No data found.   Physical Exam  Constitutional: He is oriented to person, place, and time. Vital signs are normal. He appears well-developed and well-nourished. No distress.  HENT:  Head: Normocephalic and atraumatic.  Right Ear: External ear and ear canal normal. A middle ear effusion is present.  Left Ear: Hearing, external ear and ear canal normal. A middle ear effusion is present.  Nose: Mucosal edema and rhinorrhea present. No nose lacerations, sinus tenderness, nasal deformity, septal deviation or nasal septal hematoma. No epistaxis.  No foreign bodies. Right sinus exhibits frontal sinus tenderness. Right sinus exhibits no maxillary sinus tenderness. Left sinus exhibits frontal sinus tenderness. Left sinus exhibits no maxillary sinus tenderness.  Mouth/Throat: Uvula is  midline. Mucous membranes are not pale, not dry and not cyanotic. He does not have dentures. No oral lesions. No trismus in the jaw. Normal dentition. No dental abscesses, uvula swelling, lacerations or dental caries. Posterior oropharyngeal edema and posterior oropharyngeal erythema present. No oropharyngeal exudate or tonsillar abscesses.  Bilateral TMs with air fluid level opacity; cobblestoning posterior pharynx; bilateral frontal sinuses TTP; hearing aid left ear removed for exam requires to hear normal voice  Eyes: Conjunctivae, EOM and lids are normal. Pupils are equal, round, and reactive to light. Right eye exhibits no chemosis, no discharge, no exudate and no hordeolum. No foreign body present in the right eye. Left eye exhibits no chemosis, no discharge, no exudate and no hordeolum. No foreign body present in the left eye. Right conjunctiva is not injected. Right conjunctiva has no hemorrhage. Left conjunctiva is not injected. Left conjunctiva has no hemorrhage. No scleral icterus. Right eye exhibits normal extraocular motion and no nystagmus. Left eye exhibits normal extraocular motion and no nystagmus. Right pupil is round and reactive. Left pupil is round and reactive. Pupils are equal.  Neck: Trachea normal and normal range of motion. Neck supple. No tracheal tenderness, no spinous process tenderness and no muscular tenderness present. No rigidity. No tracheal deviation, no edema, no erythema and normal range of motion present. No thyroid mass and no thyromegaly present.  Cardiovascular: Normal rate, regular rhythm, S1 normal, S2 normal, normal heart sounds and intact distal pulses.  PMI is not displaced.  Exam reveals no gallop and no friction rub.   No murmur heard. Pulmonary/Chest: Effort normal and breath sounds normal. No accessory muscle usage or stridor. No respiratory  distress. He has no decreased breath sounds. He has no wheezes. He has no rhonchi. He has no rales. He exhibits no  tenderness.  Abdominal: Soft. Bowel sounds are normal. He exhibits no shifting dullness, no distension, no pulsatile liver, no fluid wave, no abdominal bruit, no ascites, no pulsatile midline mass and no mass. There is no hepatosplenomegaly. There is no tenderness. There is no rigidity, no rebound, no guarding, no CVA tenderness, no tenderness at McBurney's point and negative Murphy's sign. Hernia confirmed negative in the ventral area.  Musculoskeletal: Normal range of motion. He exhibits no edema or tenderness.       Right shoulder: Normal.       Left shoulder: Normal.       Right elbow: Normal.      Left elbow: Normal.       Right wrist: Normal.       Left wrist: Normal.       Right hip: Normal.       Left hip: Normal.       Right knee: Normal.       Left knee: Normal.       Right ankle: Normal.       Left ankle: Normal.       Thoracic back: Normal.       Lumbar back: Normal.       Right hand: Normal.       Left hand: Normal.  Lymphadenopathy:       Head (right side): No submental, no submandibular, no tonsillar, no preauricular, no posterior auricular and no occipital adenopathy present.       Head (left side): No submental, no submandibular, no tonsillar, no preauricular, no posterior auricular and no occipital adenopathy present.    He has no cervical adenopathy.       Right cervical: No superficial cervical, no deep cervical and no posterior cervical adenopathy present.      Left cervical: No superficial cervical, no deep cervical and no posterior cervical adenopathy present.  Neurological: He is alert and oriented to person, place, and time. He is not disoriented. He displays no atrophy and no tremor. No cranial nerve deficit. He exhibits normal muscle tone. He displays no seizure activity. Coordination and gait normal. GCS eye subscore is 4. GCS verbal subscore is 5. GCS motor subscore is 6.  Skin: Skin is warm, dry and intact. No abrasion, no bruising, no burn, no ecchymosis, no  laceration, no lesion, no petechiae and no rash noted. He is not diaphoretic. No cyanosis or erythema. No pallor. Nails show no clubbing.  Psychiatric: He has a normal mood and affect. His speech is normal and behavior is normal. Judgment and thought content normal. Cognition and memory are normal.  Nursing note and vitals reviewed.   ED Course  Procedures (including critical care time)  Labs Review Labs Reviewed  URINALYSIS COMPLETEWITH MICROSCOPIC ([place] ONLY) - Abnormal; Notable for the following:    APPearance CLOUDY (*)    Hgb urine dipstick 2+ (*)    Nitrite POSITIVE (*)    Leukocytes, UA 2+ (*)    Bacteria, UA MANY (*)    Squamous Epithelial / LPF 0-5 (*)    All other components within normal limits  URINE CULTURE    Imaging Review No results found.  1417 discussed urinalysis results with patient and spouse/given copy.  Urine culture results pending in 48 hours will call patient with results once available.  Follow up with PCM in 2 weeks or [facility]/PCM  sooner if no improvement in symptoms with 48-72 hours on antibiotic.  Patient and spouse verbalized understanding of information/instructions, agreed with plan of care and had no further questions at this time.  MDM   1. UTI (lower urinary tract infection)   2. Acute frontal sinusitis, recurrence not specified   3. Other seasonal allergic rhinitis   4. Otitis media with effusion, bilateral    Due to previous history GFR 22-35 (2015) and 45 (2016 Sep) Last Cr 1.5 (Sep 2016) in past year will prescribe bactrim 400/80 BID x 7 days.  Discussed possible medication interaction with lotensin could cause hyperkalemia if muscle cramps, weakness, nausea, vomiting, palpitations to follow up for re-evaluation.  Follow up with PCM in 2 weeks for repeat urinalysis.  Medications as directed.  Patient is also to push fluids  Hydrate, avoid dehydration.  Avoid holding urine void on frequent basis every 4 to 6 hours.  If unable to void every 8 hours  follow up for re-evaluation with PCM, urgent care or ER.   Call or return to clinic as needed if these symptoms worsen or fail to improve as anticipated.  Exitcare handout on cystitis given to patient Patient verbalized agreement and understanding of treatment plan and had no further questions at this time. P2:  Hydrate   Restart nasonex 2 sprays each nostril daily.  Call if no relief after one week of use and bactrim completed for UTI.  No evidence of systemic bacterial infection, non toxic and well hydrated.  I do not see where any further testing or imaging is necessary at this time.   I will suggest supportive care, rest, good hygiene and encourage the patient to take adequate fluids.  The patient is to return to clinic or EMERGENCY ROOM if symptoms worsen or change significantly.  Exitcare handout on sinusitis given to patient.  Patient verbalized agreement and understanding of treatment plan and had no further questions at this time.   P2:  Hand washing and cover cough  Supportive treatment.   No evidence of invasive bacterial infection, non toxic and well hydrated.  This is most likely self limiting viral infection.  I do not see where any further testing or imaging is necessary at this time.   I will suggest supportive care, rest, good hygiene and encourage the patient to take adequate fluids.  The patient is to return to clinic or EMERGENCY ROOM if symptoms worsen or change significantly e.g. ear pain, fever, purulent discharge from ears or bleeding.  Exitcare handout on otitis media with effusion given to patient.  Patient verbalized agreement and understanding of treatment plan.    Patient may use normal saline nasal spray as needed.  Restart nasonex 2 sprays each nostril daily.  Consider antihistamine oral.  Avoid triggers if possible.  Shower prior to bedtime if exposed to triggers.  If allergic dust/dust mites recommend mattress/pillow covers/encasements; washing linens, vacuuming, sweeping,  dusting weekly.  Call or return to clinic as needed if these symptoms worsen or fail to improve as anticipated.   Exitcare handout on allergic rhinitis given to patient.  Patient verbalized understanding of instructions, agreed with plan of care and had no further questions at this time.  P2:  Avoidance and hand washing.   Olen Cordial, NP 12/25/14 1420

## 2014-12-25 NOTE — Discharge Instructions (Signed)
Allergic Rhinitis Allergic rhinitis is when the mucous membranes in the nose respond to allergens. Allergens are particles in the air that cause your body to have an allergic reaction. This causes you to release allergic antibodies. Through a chain of events, these eventually cause you to release histamine into the blood stream. Although meant to protect the body, it is this release of histamine that causes your discomfort, such as frequent sneezing, congestion, and an itchy, runny nose.  CAUSES Seasonal allergic rhinitis (hay fever) is caused by pollen allergens that may come from grasses, trees, and weeds. Year-round allergic rhinitis (perennial allergic rhinitis) is caused by allergens such as house dust mites, pet dander, and mold spores. SYMPTOMS  Nasal stuffiness (congestion).  Itchy, runny nose with sneezing and tearing of the eyes. DIAGNOSIS Your health care provider can help you determine the allergen or allergens that trigger your symptoms. If you and your health care provider are unable to determine the allergen, skin or blood testing may be used. Your health care provider will diagnose your condition after taking your health history and performing a physical exam. Your health care provider may assess you for other related conditions, such as asthma, pink eye, or an ear infection. TREATMENT Allergic rhinitis does not have a cure, but it can be controlled by:  Medicines that block allergy symptoms. These may include allergy shots, nasal sprays, and oral antihistamines.  Avoiding the allergen. Hay fever may often be treated with antihistamines in pill or nasal spray forms. Antihistamines block the effects of histamine. There are over-the-counter medicines that may help with nasal congestion and swelling around the eyes. Check with your health care provider before taking or giving this medicine. If avoiding the allergen or the medicine prescribed do not work, there are many new medicines  your health care provider can prescribe. Stronger medicine may be used if initial measures are ineffective. Desensitizing injections can be used if medicine and avoidance does not work. Desensitization is when a patient is given ongoing shots until the body becomes less sensitive to the allergen. Make sure you follow up with your health care provider if problems continue. HOME CARE INSTRUCTIONS It is not possible to completely avoid allergens, but you can reduce your symptoms by taking steps to limit your exposure to them. It helps to know exactly what you are allergic to so that you can avoid your specific triggers. SEEK MEDICAL CARE IF:  You have a fever.  You develop a cough that does not stop easily (persistent).  You have shortness of breath.  You start wheezing.  Symptoms interfere with normal daily activities.   This information is not intended to replace advice given to ySinusitis, Adult Sinusitis is redness, soreness, and inflammation of the paranasal sinuses. Paranasal sinuses are air pockets within the bones of your face. They are located beneath your eyes, in the middle of your forehead, and above your eyes. In healthy paranasal sinuses, mucus is able to drain out, and air is able to circulate through them by way of your nose. However, when your paranasal sinuses are inflamed, mucus and air can become trapped. This can allow bacteria and other germs to grow and cause infection. Sinusitis can develop quickly and last only a short time (acute) or continue over a long period (chronic). Sinusitis that lasts for more than 12 weeks is considered chronic. CAUSES Causes of sinusitis include:  Allergies.  Structural abnormalities, such as displacement of the cartilage that separates your nostrils (deviated septum), which can  decrease the air flow through your nose and sinuses and affect sinus drainage.  Functional abnormalities, such as when the small hairs (cilia) that line your sinuses  and help remove mucus do not work properly or are not present. SIGNS AND SYMPTOMS Symptoms of acute and chronic sinusitis are the same. The primary symptoms are pain and pressure around the affected sinuses. Other symptoms include:  Upper toothache.  Earache.  Headache.  Bad breath.  Decreased sense of smell and taste.  A cough, which worsens when you are lying flat.  Fatigue.  Fever.  Thick drainage from your nose, which often is green and may contain pus (purulent).  Swelling and warmth over the affected sinuses. DIAGNOSIS Your health care provider will perform a physical exam. During your exam, your health care provider may perform any of the following to help determine if you have acute sinusitis or chronic sinusitis:  Look in your nose for signs of abnormal growths in your nostrils (nasal polyps).  Tap over the affected sinus to check for signs of infection.  View the inside of your sinuses using an imaging device that has a light attached (endoscope). If your health care provider suspects that you have chronic sinusitis, one or more of the following tests may be recommended:  Allergy tests.  Nasal culture. A sample of mucus is taken from your nose, sent to a lab, and screened for bacteria.  Nasal cytology. A sample of mucus is taken from your nose and examined by your health care provider to determine if your sinusitis is related to an allergy. TREATMENT Most cases of acute sinusitis are related to a viral infection and will resolve on their own within 10 days. Sometimes, medicines are prescribed to help relieve symptoms of both acute and chronic sinusitis. These may include pain medicines, decongestants, nasal steroid sprays, or saline sprays. However, for sinusitis related to a bacterial infection, your health care provider will prescribe antibiotic medicines. These are medicines that will help kill the bacteria causing the infection. Rarely, sinusitis is caused by a  fungal infection. In these cases, your health care provider will prescribe antifungal medicine. For some cases of chronic sinusitis, surgery is needed. Generally, these are cases in which sinusitis recurs more than 3 times per year, despite other treatments. HOME CARE INSTRUCTIONS  Drink plenty of water. Water helps thin the mucus so your sinuses can drain more easily.  Use a humidifier.  Inhale steam 3-4 times a day (for example, sit in the bathroom with the shower running).  Apply a warm, moist washcloth to your face 3-4 times a day, or as directed by your health care provider.  Use saline nasal sprays to help moisten and clean your sinuses.  Take medicines only as directed by your health care provider.  If you were prescribed either an antibiotic or antifungal medicine, finish it all even if you start to feel better. SEEK IMMEDIATE MEDICAL CARE IF:  You have increasing pain or severe headaches.  You have nausea, vomiting, or drowsiness.  You have swelling around your face.  You have vision problems.  You have a stiff neck.  You have difficulty breathing.   This information is not intended to replace advice given to you by your health care provider. Make sure you discuss any questions you have with your health care provider.   Document Released: 02/24/2005 Document Revised: 03/17/2014 Document Reviewed: 03/11/2011 Elsevier Interactive Patient Education [employer].  by your health care provider. Make sure you discuss  any questions you have with your health care provider.   Document Released: 11/19/2000 Document Revised: 03/17/2014 Document Reviewed: 11/01/2012 Elsevier Interactive Patient Education 2016 [place]. Otitis Media With Effusion Otitis media with effusion is the presence of fluid in the middle ear. This is a common problem in children, which often follows ear infections. It may be present for weeks or longer after the infection. Unlike an acute ear  infection, otitis media with effusion refers only to fluid behind the ear drum and not infection. Children with repeated ear and sinus infections and allergy problems are the most likely to get otitis media with effusion. CAUSES  The most frequent cause of the fluid buildup is dysfunction of the eustachian tubes. These are the tubes that drain fluid in the ears to the back of the nose (nasopharynx). SYMPTOMS   The main symptom of this condition is hearing loss. As a result, you or your child may:  Listen to the TV at a loud volume.  Not respond to questions.  Ask "what" often when spoken to.  Mistake or confuse one sound or word for another.  There may be a sensation of fullness or pressure but usually not pain. DIAGNOSIS   Your health care provider will diagnose this condition by examining you or your child's ears.  Your health care provider may test the pressure in you or your child's ear with a tympanometer.  A hearing test may be conducted if the problem persists. TREATMENT   Treatment depends on the duration and the effects of the effusion.  Antibiotics, decongestants, nose drops, and cortisone-type drugs (tablets or nasal spray) may not be helpful.  Children with persistent ear effusions may have delayed language or behavioral problems. Children at risk for developmental delays in hearing, learning, and speech may require referral to a specialist earlier than children not at risk.  You or your child's health care provider may suggest a referral to an ear, nose, and throat surgeon for treatment. The following may help restore normal hearing:  Drainage of fluid.  Placement of ear tubes (tympanostomy tubes).  Removal of adenoids (adenoidectomy). HOME CARE INSTRUCTIONS   Avoid secondhand smoke.  Infants who are breastfed are less likely to have this condition.  Avoid feeding infants while they are lying flat.  Avoid known environmental allergens.  Avoid people who  are sick. SEEK MEDICAL CARE IF:   Hearing is not better in 3 months.  Hearing is worse.  Ear pain.  Drainage from the ear.  Dizziness. MAKE SURE YOU:   Understand these instructions.  Will watch your condition.  Will get help right away if you are not doing well or get worse.   This information is not intended to replace advice given to you by your health care provider. Make sure you discuss any questions you have with your health care provider.   Document Released: 04/03/2004 Document Revised: 03/17/2014 Document Reviewed: 09/21/2012 Elsevier Interactive Patient Education 2016 [employer] Urinary Tract Infection Urinary tract infections (UTIs) can develop anywhere along your urinary tract. Your urinary tract is your body's drainage system for removing wastes and extra water. Your urinary tract includes two kidneys, two ureters, a bladder, and a urethra. Your kidneys are a pair of bean-shaped organs. Each kidney is about the size of your fist. They are located below your ribs, one on each side of your spine. CAUSES Infections are caused by microbes, which are microscopic organisms, including fungi, viruses, and bacteria. These organisms are so small that  they can only be seen through a microscope. Bacteria are the microbes that most commonly cause UTIs. SYMPTOMS  Symptoms of UTIs may vary by age and gender of the patient and by the location of the infection. Symptoms in young women typically include a frequent and intense urge to urinate and a painful, burning feeling in the bladder or urethra during urination. Older women and men are more likely to be tired, shaky, and weak and have muscle aches and abdominal pain. A fever may mean the infection is in your kidneys. Other symptoms of a kidney infection include pain in your back or sides below the ribs, nausea, and vomiting. DIAGNOSIS To diagnose a UTI, your caregiver will ask you about your symptoms. Your caregiver will also ask you  to provide a urine sample. The urine sample will be tested for bacteria and white blood cells. White blood cells are made by your body to help fight infection. TREATMENT  Typically, UTIs can be treated with medication. Because most UTIs are caused by a bacterial infection, they usually can be treated with the use of antibiotics. The choice of antibiotic and length of treatment depend on your symptoms and the type of bacteria causing your infection. HOME CARE INSTRUCTIONS  If you were prescribed antibiotics, take them exactly as your caregiver instructs you. Finish the medication even if you feel better after you have only taken some of the medication.  Drink enough water and fluids to keep your urine clear or pale yellow.  Avoid caffeine, tea, and carbonated beverages. They tend to irritate your bladder.  Empty your bladder often. Avoid holding urine for long periods of time.  Empty your bladder before and after sexual intercourse.  After a bowel movement, women should cleanse from front to back. Use each tissue only once. SEEK MEDICAL CARE IF:   You have back pain.  You develop a fever.  Your symptoms do not begin to resolve within 3 days. SEEK IMMEDIATE MEDICAL CARE IF:   You have severe back pain or lower abdominal pain.  You develop chills.  You have nausea or vomiting.  You have continued burning or discomfort with urination. MAKE SURE YOU:   Understand these instructions.  Will watch your condition.  Will get help right away if you are not doing well or get worse.   This information is not intended to replace advice given to you by your health care provider. Make sure you discuss any questions you have with your health care provider.   Document Released: 12/04/2004 Document Revised: 11/15/2014 Document Reviewed: 04/04/2011 Elsevier Interactive Patient Education [employer].

## 2014-12-26 ENCOUNTER — Inpatient Hospital Stay
Admission: EM | Admit: 2014-12-26 | Discharge: 2014-12-30 | DRG: 690 | Disposition: A | Discharge status: Home / Self Care | Payer: PPO | Attending: Internal Medicine | Admitting: Internal Medicine

## 2014-12-26 ENCOUNTER — Encounter: Payer: Self-pay | Admitting: Internal Medicine

## 2014-12-26 ENCOUNTER — Emergency Department: Payer: PPO

## 2014-12-26 DIAGNOSIS — Z8042 Family history of malignant neoplasm of prostate: Secondary | ICD-10-CM | POA: Diagnosis not present

## 2014-12-26 DIAGNOSIS — K8020 Calculus of gallbladder without cholecystitis without obstruction: Secondary | ICD-10-CM | POA: Diagnosis present

## 2014-12-26 DIAGNOSIS — N390 Urinary tract infection, site not specified: Principal | ICD-10-CM | POA: Diagnosis present

## 2014-12-26 DIAGNOSIS — Z79899 Other long term (current) drug therapy: Secondary | ICD-10-CM

## 2014-12-26 DIAGNOSIS — N132 Hydronephrosis with renal and ureteral calculous obstruction: Secondary | ICD-10-CM | POA: Diagnosis not present

## 2014-12-26 DIAGNOSIS — Z87442 Personal history of urinary calculi: Secondary | ICD-10-CM

## 2014-12-26 DIAGNOSIS — Z87440 Personal history of urinary (tract) infections: Secondary | ICD-10-CM

## 2014-12-26 DIAGNOSIS — N183 Chronic kidney disease, stage 3 unspecified: Secondary | ICD-10-CM | POA: Diagnosis present

## 2014-12-26 DIAGNOSIS — R1110 Vomiting, unspecified: Secondary | ICD-10-CM | POA: Insufficient documentation

## 2014-12-26 DIAGNOSIS — N179 Acute kidney failure, unspecified: Secondary | ICD-10-CM | POA: Diagnosis present

## 2014-12-26 DIAGNOSIS — I10 Essential (primary) hypertension: Secondary | ICD-10-CM | POA: Diagnosis present

## 2014-12-26 DIAGNOSIS — E785 Hyperlipidemia, unspecified: Secondary | ICD-10-CM | POA: Diagnosis present

## 2014-12-26 DIAGNOSIS — K219 Gastro-esophageal reflux disease without esophagitis: Secondary | ICD-10-CM | POA: Diagnosis present

## 2014-12-26 DIAGNOSIS — E871 Hypo-osmolality and hyponatremia: Secondary | ICD-10-CM

## 2014-12-26 DIAGNOSIS — H409 Unspecified glaucoma: Secondary | ICD-10-CM | POA: Diagnosis present

## 2014-12-26 DIAGNOSIS — N400 Enlarged prostate without lower urinary tract symptoms: Secondary | ICD-10-CM | POA: Diagnosis present

## 2014-12-26 DIAGNOSIS — R1084 Generalized abdominal pain: Secondary | ICD-10-CM | POA: Diagnosis not present

## 2014-12-26 DIAGNOSIS — B9620 Unspecified Escherichia coli [E. coli] as the cause of diseases classified elsewhere: Secondary | ICD-10-CM | POA: Diagnosis present

## 2014-12-26 DIAGNOSIS — Z8249 Family history of ischemic heart disease and other diseases of the circulatory system: Secondary | ICD-10-CM

## 2014-12-26 DIAGNOSIS — Z86010 Personal history of colonic polyps: Secondary | ICD-10-CM

## 2014-12-26 DIAGNOSIS — N1330 Unspecified hydronephrosis: Secondary | ICD-10-CM | POA: Diagnosis present

## 2014-12-26 DIAGNOSIS — R109 Unspecified abdominal pain: Secondary | ICD-10-CM | POA: Insufficient documentation

## 2014-12-26 DIAGNOSIS — N189 Chronic kidney disease, unspecified: Secondary | ICD-10-CM | POA: Diagnosis not present

## 2014-12-26 DIAGNOSIS — R112 Nausea with vomiting, unspecified: Secondary | ICD-10-CM | POA: Diagnosis not present

## 2014-12-26 DIAGNOSIS — I129 Hypertensive chronic kidney disease with stage 1 through stage 4 chronic kidney disease, or unspecified chronic kidney disease: Secondary | ICD-10-CM | POA: Diagnosis present

## 2014-12-26 HISTORY — DX: Migraine, unspecified, not intractable, without status migrainosus: G43.909

## 2014-12-26 HISTORY — DX: Hyperlipidemia, unspecified: E78.5

## 2014-12-26 HISTORY — DX: Urinary tract infection, site not specified: N39.0

## 2014-12-26 HISTORY — DX: Essential (primary) hypertension: I10

## 2014-12-26 HISTORY — DX: Benign prostatic hyperplasia without lower urinary tract symptoms: N40.0

## 2014-12-26 HISTORY — DX: Dizziness and giddiness: R42

## 2014-12-26 HISTORY — DX: Hypo-osmolality and hyponatremia: E87.1

## 2014-12-26 HISTORY — DX: Unspecified glaucoma: H40.9

## 2014-12-26 LAB — CBC
HEMATOCRIT: 37 % — AB (ref 40.0–52.0)
HEMOGLOBIN: 12.5 g/dL — AB (ref 13.0–18.0)
MCH: 29.7 pg (ref 26.0–34.0)
MCHC: 33.8 g/dL (ref 32.0–36.0)
MCV: 88 fL (ref 80.0–100.0)
Platelets: 198 10*3/uL (ref 150–440)
RBC: 4.21 MIL/uL — AB (ref 4.40–5.90)
RDW: 12.7 % (ref 11.5–14.5)
WBC: 21.1 10*3/uL — AB (ref 3.8–10.6)

## 2014-12-26 LAB — COMPREHENSIVE METABOLIC PANEL
ALK PHOS: 45 U/L (ref 38–126)
ALT: 20 U/L (ref 17–63)
ANION GAP: 8 (ref 5–15)
AST: 32 U/L (ref 15–41)
Albumin: 3.3 g/dL — ABNORMAL LOW (ref 3.5–5.0)
BILIRUBIN TOTAL: 0.9 mg/dL (ref 0.3–1.2)
BUN: 22 mg/dL — AB (ref 6–20)
CHLORIDE: 86 mmol/L — AB (ref 101–111)
CO2: 24 mmol/L (ref 22–32)
Calcium: 8.2 mg/dL — ABNORMAL LOW (ref 8.9–10.3)
Creatinine, Ser: 1.6 mg/dL — ABNORMAL HIGH (ref 0.61–1.24)
GFR, EST AFRICAN AMERICAN: 45 mL/min — AB (ref >60)
GFR, EST NON AFRICAN AMERICAN: 39 mL/min — AB (ref >60)
Glucose, Bld: 130 mg/dL — ABNORMAL HIGH (ref 65–99)
POTASSIUM: 3.7 mmol/L (ref 3.5–5.1)
Sodium: 118 mmol/L — CL (ref 135–145)
TOTAL PROTEIN: 6.5 g/dL (ref 6.5–8.1)

## 2014-12-26 LAB — URINALYSIS COMPLETE WITH MICROSCOPIC (ARMC ONLY)
BACTERIA UA: NONE SEEN
Bilirubin Urine: NEGATIVE
GLUCOSE, UA: NEGATIVE mg/dL
Ketones, ur: NEGATIVE mg/dL
NITRITE: NEGATIVE
PH: 5 (ref 5.0–8.0)
Protein, ur: NEGATIVE mg/dL
SPECIFIC GRAVITY, URINE: 1.017 (ref 1.005–1.030)

## 2014-12-26 LAB — LIPASE, BLOOD: LIPASE: 25 U/L (ref 22–51)

## 2014-12-26 MED ORDER — DEXTROSE 5 % IV SOLN
1.0000 g | Freq: Once | INTRAVENOUS | Status: AC
  Administered 2014-12-26: 1 g via INTRAVENOUS
  Filled 2014-12-26: qty 10

## 2014-12-26 MED ORDER — DEXTROSE 5 % IV SOLN
1.0000 g | INTRAVENOUS | Status: DC
  Administered 2014-12-27 – 2014-12-29 (×3): 1 g via INTRAVENOUS
  Filled 2014-12-26 (×4): qty 10

## 2014-12-26 MED ORDER — SODIUM CHLORIDE 0.9 % IJ SOLN
3.0000 mL | Freq: Two times a day (BID) | INTRAMUSCULAR | Status: DC
  Administered 2014-12-26 – 2014-12-30 (×3): 3 mL via INTRAVENOUS

## 2014-12-26 MED ORDER — ONDANSETRON HCL 4 MG/2ML IJ SOLN
4.0000 mg | Freq: Four times a day (QID) | INTRAMUSCULAR | Status: DC | PRN
  Administered 2014-12-26 – 2014-12-28 (×3): 4 mg via INTRAVENOUS
  Filled 2014-12-26 (×3): qty 2

## 2014-12-26 MED ORDER — SODIUM CHLORIDE 0.9 % IV SOLN
INTRAVENOUS | Status: DC
  Administered 2014-12-26: 23:00:00 via INTRAVENOUS

## 2014-12-26 MED ORDER — DORZOLAMIDE HCL-TIMOLOL MAL 2-0.5 % OP SOLN
1.0000 [drp] | Freq: Every day | OPHTHALMIC | Status: DC
  Filled 2014-12-26: qty 10

## 2014-12-26 MED ORDER — HYDROCHLOROTHIAZIDE 12.5 MG PO CAPS
12.5000 mg | ORAL_CAPSULE | Freq: Every day | ORAL | Status: DC
  Administered 2014-12-27 – 2014-12-30 (×4): 12.5 mg via ORAL
  Filled 2014-12-26 (×4): qty 1

## 2014-12-26 MED ORDER — FENOFIBRATE 160 MG PO TABS
160.0000 mg | ORAL_TABLET | Freq: Every day | ORAL | Status: DC
  Administered 2014-12-27 – 2014-12-30 (×4): 160 mg via ORAL
  Filled 2014-12-26 (×4): qty 1

## 2014-12-26 MED ORDER — ACETAMINOPHEN 650 MG RE SUPP: 650.0000 mg | Freq: Four times a day (QID) | RECTAL | Status: DC | PRN

## 2014-12-26 MED ORDER — SODIUM CHLORIDE 0.9 % IV BOLUS (SEPSIS)
1000.0000 mL | Freq: Once | INTRAVENOUS | Status: AC
  Administered 2014-12-26: 1000 mL via INTRAVENOUS

## 2014-12-26 MED ORDER — TIMOLOL HEMIHYDRATE 0.25 % OP SOLN
1.0000 [drp] | Freq: Two times a day (BID) | OPHTHALMIC | Status: DC
  Filled 2014-12-26 (×3): qty 1

## 2014-12-26 MED ORDER — ACETAMINOPHEN 325 MG PO TABS: 650.0000 mg | ORAL_TABLET | Freq: Four times a day (QID) | ORAL | Status: DC | PRN

## 2014-12-26 MED ORDER — ONDANSETRON HCL 4 MG PO TABS: 4.0000 mg | ORAL_TABLET | Freq: Four times a day (QID) | ORAL | Status: DC | PRN

## 2014-12-26 MED ORDER — HEPARIN SODIUM (PORCINE) 5000 UNIT/ML IJ SOLN
5000.0000 [IU] | Freq: Three times a day (TID) | INTRAMUSCULAR | Status: DC
  Administered 2014-12-28 (×2): 5000 [IU] via SUBCUTANEOUS
  Filled 2014-12-26 (×3): qty 1

## 2014-12-26 MED ORDER — BENAZEPRIL-HYDROCHLOROTHIAZIDE 20-12.5 MG PO TABS: 1.0000 | ORAL_TABLET | Freq: Every day | ORAL | Status: DC

## 2014-12-26 MED ORDER — BENAZEPRIL HCL 20 MG PO TABS
20.0000 mg | ORAL_TABLET | Freq: Every day | ORAL | Status: DC
  Administered 2014-12-27 – 2014-12-30 (×4): 20 mg via ORAL
  Filled 2014-12-26 (×4): qty 1

## 2014-12-26 NOTE — ED Provider Notes (Addendum)
[facility] Emergency Department Provider Note  ____________________________________________   I have reviewed the triage vital signs and the nursing notes.   HISTORY  Chief Complaint Emesis and Diarrhea    HPI Anthony Gonzales is a 81 y.o. male who presents today after being seen yesterday for UTI. Patient actually had multiple different symptoms including nausea vomiting cough lightheadedness. He has had some mild loose stools but no frank diarrhea. No bleeding. He felt somewhat weak. Patient has had distally starting his urine and burning with urination and a fever. He was diagnosed with a urinary tract infection at that urgent care, there culture is pending however, they did start him on Bactrim he has had 3 doses. However he has had 3 episodes of vomiting here. He has had continued loose stools but no frank diarrhea. He has never had C. difficile. He denies chest pain or shortness of breath although he continues to cough. He denies melena bright red blood per rectum or hematemesis. He has had at least a gallon of water to drink today.  Past Medical History  Diagnosis Date  . Hypertension     There are no active problems to display for this patient.   No past surgical history on file.  Current Outpatient Rx  Name  Route  Sig  Dispense  Refill  . benazepril-hydrochlorthiazide (LOTENSIN HCT) 20-12.5 MG tablet   Oral   Take 1 tablet by mouth daily.         . Dorzolamide HCl-Timolol Mal PF 22.3-6.8 MG/ML SOLN   Ophthalmic   Apply 1 drop to eye daily.         . fenofibrate 160 MG tablet   Oral   Take 160 mg by mouth daily.         . mometasone (NASONEX) 50 MCG/ACT nasal spray   Nasal   Place 2 sprays into the nose daily.   17 g   12   . sulfamethoxazole-trimethoprim (BACTRIM) 400-80 MG tablet   Oral   Take 1 tablet by mouth 2 (two) times daily.   14 tablet   0   . timolol (BETIMOL) 0.25 % ophthalmic solution   Both Eyes   Place  1-2 drops into both eyes 2 (two) times daily.           Allergies Demerol  No family history on file.  Social History Social History  Substance Use Topics  . Smoking status: Never Smoker   . Smokeless tobacco: Not on file  . Alcohol Use: No    Review of Systems Constitutional: Positive fever/chills MAXIMUM TEMPERATURE was 102 Eyes: No visual changes. ENT: No sore throat. No stiff neck no neck pain Cardiovascular: Denies chest pain. Positive cough Respiratory: Denies shortness of breath. Gastrointestinal:   vomiting.  diarrhea.  No constipation. Genitourinary: Positive for dysuria. Musculoskeletal: Negative lower extremity swelling Skin: Negative for rash. Neurological: Negative for headaches, focal weakness or numbness. 10-point ROS otherwise negative.  ____________________________________________   PHYSICAL EXAM:  VITAL SIGNS: ED Triage Vitals  Enc Vitals Group     BP 12/26/14 2028 140/68 mmHg     Pulse Rate 12/26/14 2028 72     Resp 12/26/14 2028 18     Temp 12/26/14 2028 98.7 F (37.1 C)     Temp Source 12/26/14 2028 Oral     SpO2 12/26/14 2028 95 %     Weight 12/26/14 2028 194 lb (87.998 kg)     Height 12/26/14 2028 6' (1.829 m)  Head Cir --      Peak Flow --      Pain Score 12/26/14 2028 5     Pain Loc --      Pain Edu? --      Excl. in [place]? --     Constitutional: Alert and oriented. Well appearing and in no acute distress. Eyes: Conjunctivae are normal. PERRL. EOMI. Head: Atraumatic. Nose: No congestion/rhinnorhea. Mouth/Throat: Mucous membranes are moist.  Oropharynx non-erythematous. Neck: No stridor.   Nontender with no meningismus Cardiovascular: Normal rate, regular rhythm. Grossly normal heart sounds.  Good peripheral circulation. Respiratory: Normal respiratory effort.  No retractions. Lungs CTAB. Gastrointestinal: Soft and nontender. No distention. No guarding no rebound Back:  There is no focal tenderness or step off there is no  midline tenderness there are no lesions noted. there is no CVA tenderness Normal external male genitalia no obvious lesions Musculoskeletal: No lower extremity tenderness. No joint effusions, no DVT signs strong distal pulses no edema Neurologic:  Normal speech and language. No gross focal neurologic deficits are appreciated.  Skin:  Skin is warm, dry and intact. No rash noted. Psychiatric: Mood and affect are normal. Speech and behavior are normal.  ____________________________________________   LABS (all labs ordered are listed, but only abnormal results are displayed)  Labs Reviewed  COMPREHENSIVE METABOLIC PANEL - Abnormal; Notable for the following:    Sodium 118 (*)    Chloride 86 (*)    Glucose, Bld 130 (*)    BUN 22 (*)    Creatinine, Ser 1.60 (*)    Calcium 8.2 (*)    Albumin 3.3 (*)    GFR calc non Af Amer 39 (*)    GFR calc Af Amer 45 (*)    All other components within normal limits  CBC - Abnormal; Notable for the following:    WBC 21.1 (*)    RBC 4.21 (*)    Hemoglobin 12.5 (*)    HCT 37.0 (*)    All other components within normal limits  URINE CULTURE  CULTURE, BLOOD (ROUTINE X 2)  CULTURE, BLOOD (ROUTINE X 2)  LIPASE, BLOOD  URINALYSIS COMPLETEWITH MICROSCOPIC ([facility] ONLY)   ____________________________________________  EKG  I personally interpreted EKG, normal sinus rhythm rate 83 bpm no acute ST elevation or depression normal axis unremarkable EKG, first-degree AV block noted ____________________________________________  RADIOLOGY  I personally reviewed x-ray ____________________________________________   PROCEDURES  Procedure(s) performed: None  Critical Care performed: None  ____________________________________________   INITIAL IMPRESSION / ASSESSMENT AND PLAN / ED COURSE  Pertinent labs & imaging results that were available during my care of the patient were reviewed by me and considered in my medical decision making (see chart for  details).  Patient is well-appearing however has a low sodium possibly secondary to polydipsia, and addition, patient had vomiting, diarrhea and fevers likely from a urinary tract infection for which we have no sensitivities. I will give him IV Rocephin for his UTI pending cultures, we will reculture him, I will give him IV fluids, and he will be admitted to the hospital. We'll also obtain a chest x-ray to make sure there is not one a month source of infection. ____________________________________________   FINAL CLINICAL IMPRESSION(S) / ED DIAGNOSES  Final diagnoses:  None     Schuyler Amor, MD 12/26/14 2143  Schuyler Amor, MD 12/26/14 2232

## 2014-12-26 NOTE — ED Notes (Addendum)
Pt to triage via w/c, actively vomiting; st yesterday rx Bactrim for kidney infection at [facility], now with N/V/D and lower abd pain

## 2014-12-26 NOTE — H&P (Signed)
[facility] NAME: Anthony Gonzales    MR#:  [id number]  DATE OF BIRTH:  [birth year]  DATE OF ADMISSION:  12/26/2014  PRIMARY CARE PHYSICIAN: Idelle Crouch, MD   REQUESTING/REFERRING PHYSICIAN: Burlene Arnt, M.D.  CHIEF COMPLAINT:   Chief Complaint  Patient presents with  . Emesis  . Diarrhea    HISTORY OF PRESENT ILLNESS:  Anthony Gonzales  is a 81 y.o. male who presents with vomiting diarrhea, weakness. Patient was seen in a walk-in clinic recently and found to have UTI. He was started on Bactrim and sent home. Since then he has gotten progressively weaker at home. He had a few episodes of vomiting. He also had significant intake of water over the last 24 hours. He presents to the ED again tonight for worsening symptoms. UTI again confirmed on UA. Started on Rocephin in the ED. Sodium found to be 118.  Reports some fevers at home. Hospitalist called for admission for UTI and hyponatremia.  PAST MEDICAL HISTORY:   Past Medical History  Diagnosis Date  . Hypertension   . HLD (hyperlipidemia)   . BPH (benign prostatic hyperplasia)   . Glaucoma   . Migraines   . Vertigo     PAST SURGICAL HISTORY:   Past Surgical History  Procedure Laterality Date  . Spermatocelectomy    . Colonoscopy      SOCIAL HISTORY:   Social History  Substance Use Topics  . Smoking status: Never Smoker   . Smokeless tobacco: Not on file  . Alcohol Use: No    FAMILY HISTORY:   Family History  Problem Relation Age of Onset  . Hypertension Mother   . Hypertension Father   . Prostate cancer Brother     DRUG ALLERGIES:   Allergies  Allergen Reactions  . Demerol [Meperidine] Nausea And Vomiting    MEDICATIONS AT HOME:   Prior to Admission medications   Medication Sig Start Date End Date Taking? Authorizing Provider  benazepril-hydrochlorthiazide (LOTENSIN HCT) 20-12.5 MG tablet Take 1 tablet by mouth daily.   Yes  Historical Provider, MD  Dorzolamide HCl-Timolol Mal PF 22.3-6.8 MG/ML SOLN Apply 1 drop to eye daily.   Yes Historical Provider, MD  fenofibrate 160 MG tablet Take 160 mg by mouth daily.   Yes Historical Provider, MD  mometasone (NASONEX) 50 MCG/ACT nasal spray Place 2 sprays into the nose daily. 12/25/14  Yes Olen Cordial, NP  sulfamethoxazole-trimethoprim (BACTRIM) 400-80 MG tablet Take 1 tablet by mouth 2 (two) times daily. 12/25/14  Yes Olen Cordial, NP  timolol (BETIMOL) 0.25 % ophthalmic solution Place 1-2 drops into both eyes 2 (two) times daily.   Yes Historical Provider, MD    REVIEW OF SYSTEMS:  Review of Systems  Constitutional: Positive for fever. Negative for chills, weight loss and malaise/fatigue.  HENT: Negative for ear pain, hearing loss and tinnitus.   Eyes: Negative for blurred vision, double vision, pain and redness.  Respiratory: Negative for cough, hemoptysis and shortness of breath.   Cardiovascular: Negative for chest pain, palpitations, orthopnea and leg swelling.  Gastrointestinal: Positive for nausea, vomiting and abdominal pain. Negative for diarrhea and constipation.  Genitourinary: Positive for dysuria. Negative for frequency and hematuria.  Musculoskeletal: Negative for back pain, joint pain and neck pain.  Skin:       No acne, rash, or lesions  Neurological: Positive for weakness. Negative for dizziness, tremors and focal weakness.  Endo/Heme/Allergies: Negative for polydipsia. Does not bruise/bleed  easily.  Psychiatric/Behavioral: Negative for depression. The patient is not nervous/anxious and does not have insomnia.      VITAL SIGNS:   Filed Vitals:   12/26/14 2028  BP: 140/68  Pulse: 72  Temp: 98.7 F (37.1 C)  TempSrc: Oral  Resp: 18  Height: 6' (1.829 m)  Weight: 87.998 kg (194 lb)  SpO2: 95%   Wt Readings from Last 3 Encounters:  12/26/14 87.998 kg (194 lb)  12/25/14 87.998 kg (194 lb)    PHYSICAL EXAMINATION:  Physical  Exam  Vitals reviewed. Constitutional: He is oriented to person, place, and time. He appears well-developed and well-nourished. No distress.  HENT:  Head: Normocephalic and atraumatic.  Mouth/Throat: Oropharynx is clear and moist.  Eyes: Conjunctivae and EOM are normal. Pupils are equal, round, and reactive to light. No scleral icterus.  Neck: Normal range of motion. Neck supple. No JVD present. No thyromegaly present.  Cardiovascular: Normal rate, regular rhythm and intact distal pulses.  Exam reveals no gallop and no friction rub.   No murmur heard. Respiratory: Effort normal and breath sounds normal. No respiratory distress. He has no wheezes. He has no rales.  GI: Soft. Bowel sounds are normal. He exhibits no distension. There is tenderness (mild lower abdomen).  Musculoskeletal: Normal range of motion. He exhibits no edema.  No arthritis, no gout  Lymphadenopathy:    He has no cervical adenopathy.  Neurological: He is alert and oriented to person, place, and time. No cranial nerve deficit.  No dysarthria, no aphasia  Skin: Skin is warm and dry. No rash noted. No erythema.  Psychiatric: He has a normal mood and affect. His behavior is normal. Judgment and thought content normal.    LABORATORY PANEL:   CBC  Recent Labs Lab 12/26/14 2040  WBC 21.1*  HGB 12.5*  HCT 37.0*  PLT 198   ------------------------------------------------------------------------------------------------------------------  Chemistries   Recent Labs Lab 12/26/14 2040  NA 118*  K 3.7  CL 86*  CO2 24  GLUCOSE 130*  BUN 22*  CREATININE 1.60*  CALCIUM 8.2*  AST 32  ALT 20  ALKPHOS 45  BILITOT 0.9   ------------------------------------------------------------------------------------------------------------------  Cardiac Enzymes No results for input(s): TROPONINI in the last 168  hours. ------------------------------------------------------------------------------------------------------------------  RADIOLOGY:  Dg Chest 2 View  12/26/2014  CLINICAL DATA:  Patient c/o weakness. Pt to triage via w/c, actively vomiting; st yesterday rx Bactrim for kidney infection at [facility], now with N/V/D and lower abd pain. H/o HTN EXAM: CHEST - 2 VIEW COMPARISON:  03/20/2013 FINDINGS: Lungs clear, with relatively low volumes. Heart size normal. Mild aortic plaque. No pneumothorax. No effusion. Visualized skeletal structures are unremarkable. IMPRESSION: No acute cardiopulmonary disease. Electronically Signed   By: Lucrezia Europe M.D.   On: 12/26/2014 22:00    EKG:   Orders placed or performed during the hospital encounter of 12/26/14  . ED EKG  . ED EKG    IMPRESSION AND PLAN:  Principal Problem:   UTI (lower urinary tract infection) - failed outpatient Bactrim. Started on IV Rocephin here. Urine culture pending. Blood cultures sent as well. Active Problems:   Hyponatremia - likely due to some amount of polydipsia over the last 24 hours in conjunction with nausea and vomiting. Suspect will recover quickly. We'll hydrate with normal saline.   HTN (hypertension) - chronic, stable, continue home meds   BPH (benign prostatic hyperplasia) - likely contributed to UTI. Not on any home medication for this. May need to start something at  discharge or follow-up with PCP.   CKD (chronic kidney disease), stage III - chronic, stable, avoid nephrotoxins, continue to monitor.   HLD (hyperlipidemia) - continue home antilipid   Glaucoma - continue home dose eyedrops  All the records are reviewed and case discussed with ED provider. Management plans discussed with the patient and/or family.  DVT PROPHYLAXIS: SubQ heparin  ADMISSION STATUS: Inpatient  CODE STATUS: Full Advance Directive Documentation        Most Recent Value   Type of Advance Directive  Living will   Pre-existing  out of facility DNR order (yellow form or pink MOST form)     "MOST" Form in Place?        TOTAL TIME TAKING CARE OF THIS PATIENT: 45 minutes.    Anthony Gonzales 12/26/2014, 10:19 PM  [facility]  Office  [phone]  CC: Primary care physician; Idelle Crouch, MD

## 2014-12-26 NOTE — Progress Notes (Addendum)
[employer] cash [phone]), [employer] one card, & [employer] gift card sent to security at pt request. Adonis Housekeeper #6. Yellow paper w/ locker key in pt's chart.

## 2014-12-27 ENCOUNTER — Telehealth: Payer: Self-pay | Admitting: Family Medicine

## 2014-12-27 LAB — RENAL FUNCTION PANEL
Albumin: 2.7 g/dL — ABNORMAL LOW (ref 3.5–5.0)
Anion gap: 7 (ref 5–15)
BUN: 20 mg/dL (ref 6–20)
CALCIUM: 7.8 mg/dL — AB (ref 8.9–10.3)
CO2: 25 mmol/L (ref 22–32)
CREATININE: 1.45 mg/dL — AB (ref 0.61–1.24)
Chloride: 86 mmol/L — ABNORMAL LOW (ref 101–111)
GFR, EST AFRICAN AMERICAN: 51 mL/min — AB (ref >60)
GFR, EST NON AFRICAN AMERICAN: 44 mL/min — AB (ref >60)
Glucose, Bld: 152 mg/dL — ABNORMAL HIGH (ref 65–99)
Phosphorus: 1.8 mg/dL — ABNORMAL LOW (ref 2.5–4.6)
Potassium: 3.7 mmol/L (ref 3.5–5.1)
SODIUM: 118 mmol/L — AB (ref 135–145)

## 2014-12-27 LAB — BASIC METABOLIC PANEL
Anion gap: 7 (ref 5–15)
BUN: 22 mg/dL — ABNORMAL HIGH (ref 6–20)
CALCIUM: 7.8 mg/dL — AB (ref 8.9–10.3)
CO2: 25 mmol/L (ref 22–32)
CREATININE: 1.52 mg/dL — AB (ref 0.61–1.24)
Chloride: 88 mmol/L — ABNORMAL LOW (ref 101–111)
GFR calc non Af Amer: 41 mL/min — ABNORMAL LOW (ref >60)
GFR, EST AFRICAN AMERICAN: 48 mL/min — AB (ref >60)
Glucose, Bld: 124 mg/dL — ABNORMAL HIGH (ref 65–99)
Potassium: 3.6 mmol/L (ref 3.5–5.1)
SODIUM: 120 mmol/L — AB (ref 135–145)

## 2014-12-27 LAB — URINE CULTURE
Culture: >=100000
Special Requests: NORMAL

## 2014-12-27 LAB — CBC
HCT: 34 % — ABNORMAL LOW (ref 40.0–52.0)
Hemoglobin: 11.7 g/dL — ABNORMAL LOW (ref 13.0–18.0)
MCH: 30.2 pg (ref 26.0–34.0)
MCHC: 34.5 g/dL (ref 32.0–36.0)
MCV: 87.5 fL (ref 80.0–100.0)
PLATELETS: 168 10*3/uL (ref 150–440)
RBC: 3.88 MIL/uL — AB (ref 4.40–5.90)
RDW: 13 % (ref 11.5–14.5)
WBC: 14.9 10*3/uL — AB (ref 3.8–10.6)

## 2014-12-27 LAB — MAGNESIUM: MAGNESIUM: 1.5 mg/dL — AB (ref 1.7–2.4)

## 2014-12-27 LAB — PHOSPHORUS: Phosphorus: 1.7 mg/dL — ABNORMAL LOW (ref 2.5–4.6)

## 2014-12-27 MED ORDER — SODIUM CHLORIDE 0.9 % IV SOLN
INTRAVENOUS | Status: DC
  Administered 2014-12-27 – 2014-12-29 (×6): via INTRAVENOUS

## 2014-12-27 NOTE — Progress Notes (Signed)
Pt refuses bed alarm. Moderate fall risk. Recent fall per pt at [facility] office this past month. Encouraged pt to call for assistance before getting out of bed. Will continue to assess.

## 2014-12-27 NOTE — Plan of Care (Signed)
Problem: Discharge Progression Outcomes Goal: Other Discharge Outcomes/Goals Plan of care progress to goal: Pain: denies Hemodynamically: VSS this shift Diet: increased appetite Activity: independent

## 2014-12-27 NOTE — Progress Notes (Signed)
Anthony Gonzales is a 81 y.o. male  UTI (lower urinary tract infection)   SUBJECTIVE:  Pt admitted with UTI associated with N/V and hyponatremia. Still nauseated this AM. No vomiting. Sodium slightly improved. Afebrile. Cultures pending.  ______________________________________________________________________  ROS: Review of systems is unremarkable for any active cardiac,respiratory, GI, GU, hematologic, neurologic or psychiatric systems, 10 systems reviewed.  '@CMEDLIST'$ @  Past Medical History  Diagnosis Date  . Hypertension   . HLD (hyperlipidemia)   . BPH (benign prostatic hyperplasia)   . Glaucoma   . Migraines   . Vertigo     Past Surgical History  Procedure Laterality Date  . Spermatocelectomy    . Colonoscopy      PHYSICAL EXAM:  BP 119/52 mmHg  Pulse 72  Temp(Src) 98.4 F (36.9 C) (Oral)  Resp 20  Ht 6' (1.829 m)  Wt 87.998 kg (194 lb)  BMI 26.31 kg/m2  SpO2 94%  Wt Readings from Last 3 Encounters:  12/26/14 87.998 kg (194 lb)  12/25/14 87.998 kg (194 lb)            Constitutional: NAD Neck: supple, no thyromegaly Respiratory: CTA, no rales or wheezes Cardiovascular: RRR, no murmur, no gallop Abdomen: soft, good BS, nontender Extremities: no edema Neuro: alert and oriented, no focal motor or sensory deficits  ASSESSMENT/PLAN:  Labs and imaging studies were reviewed  Will continue IV ABX and IV fluids. F/u on cultures. Repeat labs in AM.

## 2014-12-27 NOTE — Plan of Care (Signed)
Problem: Discharge Progression Outcomes Goal: Other Discharge Outcomes/Goals Outcome: Progressing Plan of care progress to goals: 1. No c/o pain. Resting comfortably in bed throughout the night  2. Hemodynamically:             -VSS, afebrile, IVF infusing as ordered              -Na 120, being checked Q6h             -Nausea relived by PRN Zofran  3. Moderate fall risk. +1 stand by assist. Bed alarm on, hourly rounding.

## 2014-12-27 NOTE — Plan of Care (Signed)
Problem: Acute Rehab PT Goals(only PT should resolve) Goal: PT Additional Goal #1 Pt will demonstrate fluency in HEP to perform safely at home after D/C.     Goal: PT Additional Goal #2 Pt will demonstrate 60 seconds narrow stance with supervision and without LOB to demonstrate decreased falls risk in home p D/C.

## 2014-12-27 NOTE — Telephone Encounter (Signed)
Telephone message left for patient or spouse urine culture e. Coli 100,000 sensitive to all antibiotics per Crystal in [facility] Microbiology section as sensitivities were pending in EPIC upon review this am.  Noted patient went to ER last night and admitted for nausea/vomiting/hyponatremia/hypokalemia/hypocalcermia/hypomagnesemia  Instructed patient or wife to tell attending provider urine culture results available in [facility] for review and to contact clinic if further questions or concerns regarding this message.

## 2014-12-27 NOTE — Care Management (Signed)
Admitted to this facility with the diagnosis of urinary tract infection. Lives with wife, Joaquim Lai [phone]), No home health, Takes care of both basic and instrumental activities of daily living himself, drives. Last seen Dr. Doy Hutching a month ago. Fell x 1 after laying his leg across a chair then getting up. "Circulation wasn't there." Uses no aids for ambulation. 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 Wife will transport. Shelbie Ammons RN MSN Care Management [phone]

## 2014-12-27 NOTE — Evaluation (Signed)
Physical Therapy Evaluation Patient Details Name: Anthony Gonzales MRN: [id number] DOB: 06/20/1931 Today's Date: 12/27/2014   History of Present Illness  Pt is an 81yo whit emale who presents to [facility] after 2 daysof nausea, vomitting, and decreased appetite. Pt found to have UTI, Hypo natremia upon admissing. PMH: HLD, HTN, BPH, Stage 3 KD.   Clinical Impression  Pt is received recumbent in bed upon entry,resting quietly, but willing to participate. No acute distress noted, as patient reports only mild nausea, most of which has improved. Pt is A&Ox4 and pleasant. Pt reports one fall in the last 6 months, directly related to this visit, otherwise, is low falls evident in gait speed, but mild to moderate falls risk as it pertains to other testing. Pt strength as screened by functional mobility assessment presents without significant impairment, although pt reports feeling weak over past few days. Patient presenting with impairment of balance and activity tolerance, limiting ability to perform ADL and mobility tasks at  baseline level of function. Patient will benefit from skilled intervention to address the above impairments and limitations, in order to restore to prior level of function, improve patient safety upon discharge, and to decrease falls risk.       Follow Up Recommendations Home health PT    Equipment Recommendations  None recommended by PT    Recommendations for Other Services       Precautions / Restrictions Precautions Precautions: None Restrictions Weight Bearing Restrictions: No      Mobility  Bed Mobility Overal bed mobility: Independent                Transfers Overall transfer level: Independent                  Ambulation/Gait Ambulation/Gait assistance: Supervision Ambulation Distance (Feet): 240 Feet Assistive device: None Gait Pattern/deviations: WFL(Within Functional Limits) Gait velocity: 1.26 m/s: low falls risk, appropriate for  community dwelling.  Gait velocity interpretation: >2.62 ft/sec, indicative of independent community ambulator General Gait Details: Starting gait with a 2:1 ration of cadence L:R. Timing gradually becomes symmetrical, but R step length continues to be ~50% of L step length. Pt with mild vaulting on L as if potential LLD, however no further inquiry made, as patient reports his AMB to be at baseline.   Stairs Stairs: Yes Stairs assistance: Min guard Stair Management: One rail Right Number of Stairs: 2 General stair comments: Performs safely.   Wheelchair Mobility    Modified Rankin (Stroke Patients Only)       Balance Overall balance assessment: Needs assistance (Single fall PTA. LOB with forward reach <12 inches. Postiive sharpened rhomberg,unable tomaintain safe narrow stance without assistance. )                                           Pertinent Vitals/Pain Pain Assessment:  (Reports discomfort in stomach. )    Home Living Family/patient expects to be discharged to:: Private residence Living Arrangements: Spouse/significant other Available Help at Discharge: Family Type of Home: House Home Access: Stairs to enter Entrance Stairs-Rails: Right Entrance Stairs-Number of Steps: 2 Home Layout: One level Home Equipment: None      Prior Function Level of Independence: Independent         Comments: Community dweller.      Hand Dominance        Extremity/Trunk Assessment   Upper Extremity Assessment:  Overall WFL for tasks assessed           Lower Extremity Assessment: Overall WFL for tasks assessed      Cervical / Trunk Assessment: Normal (mild forward head posture, downward gaze. )  Communication   Communication: HOH  Cognition Arousal/Alertness: Awake/alert Behavior During Therapy: WFL for tasks assessed/performed Overall Cognitive Status: Within Functional Limits for tasks assessed                      General Comments       Exercises Other Exercises Other Exercises: Over head reciprocal reaching 1x10 bilat: Min guard Other Exercises: Normal stance body turns with posterio gaze: 1x10 bilat alteranting @ min guard Other Exercises: Narrow stance 3x30sec  Other Exercises: Normal stand BUE 8" forward lean and reach: 1x10 minguard.       Assessment/Plan    PT Assessment Patient needs continued PT services  PT Diagnosis Difficulty walking   PT Problem List Decreased balance;Decreased mobility;Pain  PT Treatment Interventions Balance training;Therapeutic activities;Functional mobility training   PT Goals (Current goals can be found in the Care Plan section) Acute Rehab PT Goals Patient Stated Goal: Become more stable on feet.  PT Goal Formulation: With patient Time For Goal Achievement: 01/10/15 Potential to Achieve Goals: Good    Frequency Min 2X/week   Barriers to discharge        Co-evaluation               End of Session Equipment Utilized During Treatment: Gait belt Activity Tolerance: Patient tolerated treatment well Patient left: in bed;with call bell/phone within reach;with bed alarm set Nurse Communication: Mobility status;Other (comment)         Time: 9753-0051 PT Time Calculation (min) (ACUTE ONLY): 19 min   Charges:   PT Evaluation $Initial PT Evaluation Tier I: 1 Procedure PT Treatments $Therapeutic Activity: 8-22 mins   PT G Codes:        Buccola,Allan C Jan 02, 2015, 9:51 AM  9:54 AM  Etta Grandchild, PT, DPT [place] License # [id number]

## 2014-12-28 ENCOUNTER — Inpatient Hospital Stay: Payer: PPO

## 2014-12-28 ENCOUNTER — Encounter: Payer: Self-pay | Admitting: Radiology

## 2014-12-28 DIAGNOSIS — R109 Unspecified abdominal pain: Secondary | ICD-10-CM | POA: Insufficient documentation

## 2014-12-28 DIAGNOSIS — R112 Nausea with vomiting, unspecified: Secondary | ICD-10-CM

## 2014-12-28 DIAGNOSIS — R1110 Vomiting, unspecified: Secondary | ICD-10-CM | POA: Insufficient documentation

## 2014-12-28 DIAGNOSIS — N189 Chronic kidney disease, unspecified: Secondary | ICD-10-CM

## 2014-12-28 DIAGNOSIS — R1084 Generalized abdominal pain: Secondary | ICD-10-CM

## 2014-12-28 DIAGNOSIS — N132 Hydronephrosis with renal and ureteral calculous obstruction: Secondary | ICD-10-CM

## 2014-12-28 LAB — BASIC METABOLIC PANEL
Anion gap: 5 (ref 5–15)
BUN: 16 mg/dL (ref 6–20)
CO2: 25 mmol/L (ref 22–32)
Calcium: 7.7 mg/dL — ABNORMAL LOW (ref 8.9–10.3)
Chloride: 93 mmol/L — ABNORMAL LOW (ref 101–111)
Creatinine, Ser: 1.21 mg/dL (ref 0.61–1.24)
GFR calc Af Amer: >60 mL/min (ref >60)
GFR calc non Af Amer: 54 mL/min — ABNORMAL LOW (ref >60)
Glucose, Bld: 106 mg/dL — ABNORMAL HIGH (ref 65–99)
Potassium: 3.5 mmol/L (ref 3.5–5.1)
Sodium: 123 mmol/L — ABNORMAL LOW (ref 135–145)

## 2014-12-28 LAB — CBC WITH DIFFERENTIAL/PLATELET
BASOS PCT: 0 %
Basophils Absolute: 0 10*3/uL (ref 0–0.1)
EOS ABS: 0.2 10*3/uL (ref 0–0.7)
Eosinophils Relative: 2 %
HCT: 34.3 % — ABNORMAL LOW (ref 40.0–52.0)
HEMOGLOBIN: 11.9 g/dL — AB (ref 13.0–18.0)
Lymphocytes Relative: 10 %
Lymphs Abs: 0.8 10*3/uL — ABNORMAL LOW (ref 1.0–3.6)
MCH: 30.5 pg (ref 26.0–34.0)
MCHC: 34.8 g/dL (ref 32.0–36.0)
MCV: 87.6 fL (ref 80.0–100.0)
MONOS PCT: 12 %
Monocytes Absolute: 1 10*3/uL (ref 0.2–1.0)
NEUTROS PCT: 76 %
Neutro Abs: 6.2 10*3/uL (ref 1.4–6.5)
PLATELETS: 201 10*3/uL (ref 150–440)
RBC: 3.92 MIL/uL — AB (ref 4.40–5.90)
RDW: 12.9 % (ref 11.5–14.5)
WBC: 8.2 10*3/uL (ref 3.8–10.6)

## 2014-12-28 LAB — URINE CULTURE: Culture: 90000

## 2014-12-28 MED ORDER — IOHEXOL 300 MG/ML  SOLN
100.0000 mL | Freq: Once | INTRAMUSCULAR | Status: AC | PRN
  Administered 2014-12-28: 100 mL via INTRAVENOUS

## 2014-12-28 NOTE — Plan of Care (Signed)
Problem: Discharge Progression Outcomes Goal: Other Discharge Outcomes/Goals Outcome: Progressing Plan of care progress to goals: 1. No c/o pain. Resting comfortably in bed throughout the night   2. Hemodynamically:             -VSS, afebrile, IVF infusing as ordered               -Na 118             -Nausea relived by PRN Zofran x1   3. Moderate fall risk. Refuses bed alarm. Hourly rounding. Pt encouraged to call for assistance when going to bathroom.

## 2014-12-28 NOTE — Progress Notes (Signed)
Pt voided 200 ml of clear urine, bladder scan performed post void and revealed 21m of urine left in bladder. Pt without c/o

## 2014-12-28 NOTE — Plan of Care (Signed)
Problem: Discharge Progression Outcomes Goal: Other Discharge Outcomes/Goals Pt voiding every hours, denies pain with urination, bladder scan resulted 229m post void residual, pt without c/o n/v/d, no pain noted, pt ambulating in hall with IV poll without assistance, tolerated well.

## 2014-12-28 NOTE — Progress Notes (Signed)
Physical Therapy Treatment Patient Details Name: Anthony Gonzales MRN: [id number] DOB: 07-17-1931 Today's Date: 12/28/2014    History of Present Illness Pt is an 81 yo white male who presents to [facility] after 2 days of nausea, vomitting, and decreased appetite. Pt found to have UTI, Hyponatremia upon admissing. PMH: HLD, HTN, BPH, Stage 3 KD.     PT Comments    Pt met in hallway ambulating around nursing loop by himself holding onto IV pole.  Pt then walked 2 more laps (with therapist pushing IV pole) without UE support and pt demonstrating steady independent functional mobility.  Pt scored 28/28 on Tinetti balance assessment indicating pt is at low risk for falls.  No further PT needs identified during hospital stay.  Will complete PT order.  Please re-consult PT if any questions or concerns arise.   Follow Up Recommendations  No PT follow up     Equipment Recommendations  None recommended by PT    Recommendations for Other Services       Precautions / Restrictions Precautions Precautions: None (moderate fall risk) Restrictions Weight Bearing Restrictions: No    Mobility  Bed Mobility Overal bed mobility: Independent                Transfers Overall transfer level: Independent                  Ambulation/Gait Ambulation/Gait assistance: Independent Ambulation Distance (Feet): 350 Feet Assistive device: None Gait Pattern/deviations: WFL(Within Functional Limits)   Gait velocity interpretation: at or above normal speed for age/gender General Gait Details: steady without loss of balance   Stairs            Wheelchair Mobility    Modified Rankin (Stroke Patients Only)       Balance Overall balance assessment: Modified Independent Sitting-balance support: No upper extremity supported;Feet supported Sitting balance-Leahy Scale: Normal     Standing balance support: No upper extremity supported Standing balance-Leahy Scale: Normal                       Cognition Arousal/Alertness: Awake/alert Behavior During Therapy: WFL for tasks assessed/performed Overall Cognitive Status: Within Functional Limits for tasks assessed                      Exercises      General Comments   Nursing cleared pt for participation in physical therapy.  Pt agreeable to PT session.      Pertinent Vitals/Pain Pain Assessment: No/denies pain    Home Living                      Prior Function            PT Goals (current goals can now be found in the care plan section) Acute Rehab PT Goals Patient Stated Goal: to go home PT Goal Formulation: With patient Time For Goal Achievement: 01/10/15 Potential to Achieve Goals: Good Progress towards PT goals: Progressing toward goals    Frequency  Other (Comment) (Discharge from PT in house)    PT Plan Discharge plan needs to be updated (CM notified)    Co-evaluation             End of Session   Activity Tolerance: Patient tolerated treatment well Patient left: in bed;with call bell/phone within reach (pt moderate fall risk)     Time: 9518-8416 PT Time Calculation (min) (ACUTE ONLY): 10 min  Charges:  $  Therapeutic Activity: 8-22 mins                    G CodesLeitha Bleak 2015/01/21, 4:04 PM Leitha Bleak, [place]

## 2014-12-28 NOTE — Care Management (Signed)
Currently PT is recommending Home health PT.  List of agencies provided to patient.  [facility] selected if needed at time of discharge.  Corene Cornea from [facility] notified of pending discharge

## 2014-12-28 NOTE — Consult Note (Signed)
[facility]  [address], [place], [place] [address] Phone: [phone] Fax : [phone]  Consultation  Referring Provider:     No ref. provider found Primary Care Physician:  Idelle Crouch, MD Primary Gastroenterologist:  Dr. Gustavo Lah         Reason for Consultation:     Nausea vomiting diarrhea  Date of Admission:  12/26/2014 Date of Consultation:  12/28/2014         HPI:   Anthony Gonzales is a 81 y.o. male who was admitted with a urinary tract infection and near syncope. The patient states that he was found to have a urinary tract infection and started on antibiotics. The patient reports that this all started approximate 4 days ago when he went to the emergency department. The patient states that he has worsening of his nausea in the morning after he wakes up. The patient had nausea this morning but states he has had no vomiting since prior to admission. The patient also states that his diarrhea has completely resolved. The patient had a episode of food poisoning approximately January of last year with admission at that time and was seen by my nurse practitioner. The patient also had a colonoscopy since then for a history of colon polyps and was told that he had one single polyp. The patient now reports that he is not eating well but he is tolerating food. There is no vomiting as stated above and his nausea has resolved. The patient does report some tightness of his lower abdomen but has not had a bowel movement in the last few days.  Past Medical History  Diagnosis Date  . Hypertension   . HLD (hyperlipidemia)   . BPH (benign prostatic hyperplasia)   . Glaucoma   . Migraines   . Vertigo     Past Surgical History  Procedure Laterality Date  . Spermatocelectomy    . Colonoscopy      Prior to Admission medications   Medication Sig Start Date End Date Taking? Authorizing Provider  benazepril-hydrochlorthiazide (LOTENSIN HCT) 20-12.5 MG tablet Take  1 tablet by mouth daily.   Yes Historical Provider, MD  Dorzolamide HCl-Timolol Mal PF 22.3-6.8 MG/ML SOLN Apply 1 drop to eye daily.   Yes Historical Provider, MD  fenofibrate 160 MG tablet Take 160 mg by mouth daily.   Yes Historical Provider, MD  mometasone (NASONEX) 50 MCG/ACT nasal spray Place 2 sprays into the nose daily. 12/25/14  Yes Olen Cordial, NP  sulfamethoxazole-trimethoprim (BACTRIM) 400-80 MG tablet Take 1 tablet by mouth 2 (two) times daily. 12/25/14  Yes Olen Cordial, NP  timolol (BETIMOL) 0.25 % ophthalmic solution Place 1-2 drops into both eyes 2 (two) times daily.   Yes Historical Provider, MD    Family History  Problem Relation Age of Onset  . Hypertension Mother   . Hypertension Father   . Prostate cancer Brother      Social History  Substance Use Topics  . Smoking status: Never Smoker   . Smokeless tobacco: None  . Alcohol Use: No    Allergies as of 12/26/2014 - Review Complete 12/26/2014  Allergen Reaction Noted  . Demerol [meperidine] Nausea And Vomiting 12/25/2014    Review of Systems:    All systems reviewed and negative except where noted in HPI.   Physical Exam:  Vital signs in last 24 hours: Temp:  [98.2 F (36.8 C)-98.5 F (36.9 C)] 98.5 F (36.9 C) (10/20 1433) Pulse Rate:  [61-75] 75 (  10/20 1433) Resp:  [18-20] 20 (10/20 1433) BP: (90-136)/(46-63) 117/50 mmHg (10/20 1433) SpO2:  [94 %-97 %] 95 % (10/20 1433) Last BM Date: 12/26/14 General:   Pleasant, cooperative in NAD Head:  Normocephalic and atraumatic. Eyes:   No icterus.   Conjunctiva pink. PERRLA. Ears:  Normal auditory acuity. Neck:  Supple; no masses or thyroidomegaly Lungs: Respirations even and unlabored. Lungs clear to auscultation bilaterally.   No wheezes, crackles, or rhonchi.  Heart:  Regular rate and rhythm;  Without murmur, clicks, rubs or gallops Abdomen:  Soft, nondistended, nontender. Normal bowel sounds. No appreciable masses or hepatomegaly.  No rebound  or guarding.  Rectal:  Not performed. Msk:  Symmetrical without gross deformities.  Strength  Extremities:  Without edema, cyanosis or clubbing. Neurologic:  Alert and oriented x3;  grossly normal neurologically. Skin:  Intact without significant lesions or rashes. Cervical Nodes:  No significant cervical adenopathy. Psych:  Alert and cooperative. Normal affect.  LAB RESULTS:  Recent Labs  12/26/14 2040 12/27/14 0231 12/28/14 0650  WBC 21.1* 14.9* 8.2  HGB 12.5* 11.7* 11.9*  HCT 37.0* 34.0* 34.3*  PLT 198 168 201   BMET  Recent Labs  12/27/14 0231 12/27/14 1423 12/28/14 0650  NA 120* 118* 123*  K 3.6 3.7 3.5  CL 88* 86* 93*  CO2 '25 25 25  '$ GLUCOSE 124* 152* 106*  BUN 22* 20 16  CREATININE 1.52* 1.45* 1.21  CALCIUM 7.8* 7.8* 7.7*   LFT  Recent Labs  12/26/14 2040 12/27/14 1423  PROT 6.5  --   ALBUMIN 3.3* 2.7*  AST 32  --   ALT 20  --   ALKPHOS 45  --   BILITOT 0.9  --    PT/INR No results for input(s): LABPROT, INR in the last 72 hours.  STUDIES: Dg Chest 2 View  12/26/2014  CLINICAL DATA:  Patient c/o weakness. Pt to triage via w/c, actively vomiting; st yesterday rx Bactrim for kidney infection at [facility], now with N/V/D and lower abd pain. H/o HTN EXAM: CHEST - 2 VIEW COMPARISON:  03/20/2013 FINDINGS: Lungs clear, with relatively low volumes. Heart size normal. Mild aortic plaque. No pneumothorax. No effusion. Visualized skeletal structures are unremarkable. IMPRESSION: No acute cardiopulmonary disease. Electronically Signed   By: Lucrezia Europe M.D.   On: 12/26/2014 22:00   US Abdomen Complete  12/28/2014  CLINICAL DATA:  Abdominal pain and nausea EXAM: ULTRASOUND ABDOMEN COMPLETE COMPARISON:  03/21/2013 FINDINGS: Gallbladder: Several mobile echogenic foci the 2 largest measuring 9 mm and 14 mm respectively consistent with stones. There is no sonographic Murphy sign. There are portions of the gallbladder wall that appear thickened up to 5 mm.  Common bile duct: Diameter: 5 mm Liver: No focal lesion identified. Within normal limits in parenchymal echogenicity. IVC: No abnormality visualized. Pancreas: Grossly negative but only seen in limited detail Spleen: Size and appearance within normal limits. Right Kidney: Length: 11.6 cm. Limited detail of the right kidney appears to be show moderate right hydronephrosis. There is a 2.6 cm lower pole cyst. Left Kidney: Length: 11.6 cm. Limited detail but no evidence of mass stone or hydronephrosis. Abdominal aorta: No aneurysm visualized. Other findings: None. IMPRESSION: 1. Cholelithiasis. Nonspecific gallbladder wall thickening. No sonographic Murphy sign. 2. Findings suggest hydronephrosis right kidney 3. Suggest CT scan without and with contrast to evaluate further the above findings. Electronically Signed   By: Skipper Cliche M.D.   On: 12/28/2014 10:37      Impression /  Plan:   Anthony Gonzales is a 81 y.o. y/o male with it for a urinary tract infection who is now getting antibiotics. The patient had nausea and vomiting prior to admission and reports some diarrhea. The patient has had no further diarrhea and he reports that his nausea resolved this morning without any nausea the rest of the day. The patient is now tolerating food by mouth. The patient's symptoms are likely a result of his urinary tract infection and him laying supine all night long causing reflux. The patient states he does have some reflux when he is laying down. The patient has been told to increase the head of his bed and try to sit up during the day. There is no need for further intervention such as an EGD and colonoscopy at this time. The patient agrees with this and states that he will let me know if his symptoms return. The patient also had an ultrasound that showed some gallstones and right-sided hydronephrosis. Thank you for involving me in the cast patient if any questions please do not hesitate: This dictation.   Thank  you for involving me in the care of this patient.      LOS: 2 days   Ollen Bowl, MD  12/28/2014, 5:02 PM   Note: This dictation was prepared with Dragon dictation along with smaller phrase technology. Any transcriptional errors that result from this process are unintentional.

## 2014-12-28 NOTE — Progress Notes (Signed)
Anthony Gonzales is a 81 y.o. male  UTI (lower urinary tract infection)   SUBJECTIVE:  Pt feels "miserable" today. Thinks that something is "seriously wrong" and that we are "missing something". Has abdominal pain and nausea with some loose stools. Not eating much. Also with dysuria and hesistancy. Afebrile. WBC normal. Sodium improving.  ______________________________________________________________________  ROS: Review of systems is unremarkable for any active cardiac,respiratory, GI, GU, hematologic, neurologic or psychiatric systems, 10 systems reviewed.  '@CMEDLIST'$ @  Past Medical History  Diagnosis Date  . Hypertension   . HLD (hyperlipidemia)   . BPH (benign prostatic hyperplasia)   . Glaucoma   . Migraines   . Vertigo     Past Surgical History  Procedure Laterality Date  . Spermatocelectomy    . Colonoscopy      PHYSICAL EXAM:  BP 136/63 mmHg  Pulse 65  Temp(Src) 98.3 F (36.8 C) (Oral)  Resp 18  Ht 6' (1.829 m)  Wt 87.998 kg (194 lb)  BMI 26.31 kg/m2  SpO2 97%  Wt Readings from Last 3 Encounters:  12/26/14 87.998 kg (194 lb)  12/25/14 87.998 kg (194 lb)            Constitutional: NAD Neck: supple, no thyromegaly Respiratory: CTA, no rales or wheezes Cardiovascular: RRR, no murmur, no gallop Abdomen: soft, good BS, nontender Extremities: no edema Neuro: alert and oriented, no focal motor or sensory deficits  ASSESSMENT/PLAN:  Labs and imaging studies were reviewed  Abdominal US today. Consult GI and Urology. Continue IV fluids and IV ABX. Repeat labs in AM.

## 2014-12-28 NOTE — Care Management Important Message (Signed)
Important Message  Patient Details  Name: Anthony Gonzales MRN: [id number] Date of Birth: 08/07/1931   Medicare Important Message Given:  Yes-second notification given    Darius Bump Allmond 12/28/2014, 9:39 AM

## 2014-12-28 NOTE — Consult Note (Signed)
Reason for Consult: Lower Urinary Tract Symptoms, Cystitis, Chronic Renal Insuficiency  Referring Physician: Fulton Reek MD    Anthony Gonzales is an 81 y.o. male.   HPI:   1 -Cystitis - pan-sensitive e.coli by [facility] 10/17 this admission. Now afebrile and resolved leukocytosis.   2 - Chronic Renal Insufficiency - Cr 1.6-2 2015. [place] 12/2014 with mod rt hydro, CT pending. Now to <1.3.   3 - Recurrent Nephrolithiasis - medical passage of stones x several previously. Has not seen Urologist previously. CT pending.   4 - Moderate Right Hydronephrosis - moderate rt hydro and prox ureteral dilation by [place] 12/2014. No CVAT or flank pain. CT pending.   Today " Anthony Gonzales " is seen in consultation for above. He is referred by Dr. Doy Hutching. He has improved to baseline clinically following treatment for cystitis and will likely be DC tomorrow.   Past Medical History  Diagnosis Date  . Hypertension   . HLD (hyperlipidemia)   . BPH (benign prostatic hyperplasia)   . Glaucoma   . Migraines   . Vertigo     Past Surgical History  Procedure Laterality Date  . Spermatocelectomy    . Colonoscopy      Family History  Problem Relation Age of Onset  . Hypertension Mother   . Hypertension Father   . Prostate cancer Brother     Social History:  reports that he has never smoked. He does not have any smokeless tobacco history on file. He reports that he does not drink alcohol or use illicit drugs.  Allergies:  Allergies  Allergen Reactions  . Demerol [Meperidine] Nausea And Vomiting    Medications: I have reviewed the patient's current medications.  Results for orders placed or performed during the hospital encounter of 12/26/14 (from the past 48 hour(s))  Lipase, blood     Status: None   Collection Time: 12/26/14  8:40 PM  Result Value Ref Range   Lipase 25 22 - 51 U/L  Comprehensive metabolic panel     Status: Abnormal   Collection Time: 12/26/14  8:40 PM  Result Value Ref Range   Sodium  118 (LL) 135 - 145 mmol/L    Comment: CRITICAL RESULT CALLED TO, READ BACK BY AND VERIFIED WITH BRAD YEATTS _0  12/26/14.Marland KitchenMarland KitchenAJO    Potassium 3.7 3.5 - 5.1 mmol/L   Chloride 86 (L) 101 - 111 mmol/L   CO2 24 22 - 32 mmol/L   Glucose, Bld 130 (H) 65 - 99 mg/dL   BUN 22 (H) 6 - 20 mg/dL   Creatinine, Ser 1.60 (H) 0.61 - 1.24 mg/dL   Calcium 8.2 (L) 8.9 - 10.3 mg/dL   Total Protein 6.5 6.5 - 8.1 g/dL   Albumin 3.3 (L) 3.5 - 5.0 g/dL   AST 32 15 - 41 U/L   ALT 20 17 - 63 U/L   Alkaline Phosphatase 45 38 - 126 U/L   Total Bilirubin 0.9 0.3 - 1.2 mg/dL   GFR calc non Af Amer 39 (L) >60 mL/min   GFR calc Af Amer 45 (L) >60 mL/min    Comment: (NOTE) The eGFR has been calculated using the CKD EPI equation. This calculation has not been validated in all clinical situations. eGFR's persistently <60 mL/min signify possible Chronic Kidney Disease.    Anion gap 8 5 - 15  CBC     Status: Abnormal   Collection Time: 12/26/14  8:40 PM  Result Value Ref Range   WBC 21.1 (H) 3.8 - 10.6  K/uL   RBC 4.21 (L) 4.40 - 5.90 MIL/uL   Hemoglobin 12.5 (L) 13.0 - 18.0 g/dL   HCT 37.0 (L) 40.0 - 52.0 %   MCV 88.0 80.0 - 100.0 fL   MCH 29.7 26.0 - 34.0 pg   MCHC 33.8 32.0 - 36.0 g/dL   RDW 12.7 11.5 - 14.5 %   Platelets 198 150 - 440 K/uL  Magnesium     Status: Abnormal   Collection Time: 12/26/14  8:40 PM  Result Value Ref Range   Magnesium 1.5 (L) 1.7 - 2.4 mg/dL  Phosphorus     Status: Abnormal   Collection Time: 12/26/14  8:40 PM  Result Value Ref Range   Phosphorus 1.7 (L) 2.5 - 4.6 mg/dL  Urinalysis complete, with microscopic ([facility] only)     Status: Abnormal   Collection Time: 12/26/14  9:31 PM  Result Value Ref Range   Color, Urine YELLOW (A) YELLOW   APPearance HAZY (A) CLEAR   Glucose, UA NEGATIVE NEGATIVE mg/dL   Bilirubin Urine NEGATIVE NEGATIVE   Ketones, ur NEGATIVE NEGATIVE mg/dL   Specific Gravity, Urine 1.017 1.005 - 1.030   Hgb urine dipstick 3+ (A) NEGATIVE   pH 5.0 5.0 - 8.0    Protein, ur NEGATIVE NEGATIVE mg/dL   Nitrite NEGATIVE NEGATIVE   Leukocytes, UA 3+ (A) NEGATIVE   RBC / HPF TOO NUMEROUS TO COUNT 0 - 5 RBC/hpf   WBC, UA TOO NUMEROUS TO COUNT 0 - 5 WBC/hpf   Bacteria, UA NONE SEEN NONE SEEN   Squamous Epithelial / LPF 0-5 (A) NONE SEEN   Mucous PRESENT   Urine culture     Status: None (Preliminary result)   Collection Time: 12/26/14  9:31 PM  Result Value Ref Range   Specimen Description URINE, RANDOM    Special Requests NONE    Culture      90,000 COLONIES/ml ESCHERICHIA COLI SUSCEPTIBILITIES TO FOLLOW    Report Status PENDING   Culture, blood (routine x 2)     Status: None (Preliminary result)   Collection Time: 12/26/14  9:45 PM  Result Value Ref Range   Specimen Description BLOOD BLOOD LEFT FOREARM    Special Requests BOTTLES DRAWN AEROBIC AND ANAEROBIC 5CC    Culture NO GROWTH 2 DAYS    Report Status PENDING   Culture, blood (routine x 2)     Status: None (Preliminary result)   Collection Time: 12/27/14  2:25 AM  Result Value Ref Range   Specimen Description BLOOD RIGHT ANTECUBITAL    Special Requests BOTTLES DRAWN AEROBIC AND ANAEROBIC [place]    Culture NO GROWTH 1 DAY    Report Status PENDING   Basic metabolic panel     Status: Abnormal   Collection Time: 12/27/14  2:31 AM  Result Value Ref Range   Sodium 120 (L) 135 - 145 mmol/L   Potassium 3.6 3.5 - 5.1 mmol/L   Chloride 88 (L) 101 - 111 mmol/L   CO2 25 22 - 32 mmol/L   Glucose, Bld 124 (H) 65 - 99 mg/dL   BUN 22 (H) 6 - 20 mg/dL   Creatinine, Ser 1.52 (H) 0.61 - 1.24 mg/dL   Calcium 7.8 (L) 8.9 - 10.3 mg/dL   GFR calc non Af Amer 41 (L) >60 mL/min   GFR calc Af Amer 48 (L) >60 mL/min    Comment: (NOTE) The eGFR has been calculated using the CKD EPI equation. This calculation has not been validated in all clinical situations.  eGFR's persistently <60 mL/min signify possible Chronic Kidney Disease.    Anion gap 7 5 - 15  CBC     Status: Abnormal   Collection Time:  12/27/14  2:31 AM  Result Value Ref Range   WBC 14.9 (H) 3.8 - 10.6 K/uL   RBC 3.88 (L) 4.40 - 5.90 MIL/uL   Hemoglobin 11.7 (L) 13.0 - 18.0 g/dL   HCT 34.0 (L) 40.0 - 52.0 %   MCV 87.5 80.0 - 100.0 fL   MCH 30.2 26.0 - 34.0 pg   MCHC 34.5 32.0 - 36.0 g/dL   RDW 13.0 11.5 - 14.5 %   Platelets 168 150 - 440 K/uL  Renal function panel     Status: Abnormal   Collection Time: 12/27/14  2:23 PM  Result Value Ref Range   Sodium 118 (LL) 135 - 145 mmol/L    Comment: CRITICAL RESULT CALLED TO, READ BACK BY AND VERIFIED WITH  CHRISTINA [facility] AT 1453 12/27/14 WDM    Potassium 3.7 3.5 - 5.1 mmol/L   Chloride 86 (L) 101 - 111 mmol/L   CO2 25 22 - 32 mmol/L   Glucose, Bld 152 (H) 65 - 99 mg/dL   BUN 20 6 - 20 mg/dL   Creatinine, Ser 1.45 (H) 0.61 - 1.24 mg/dL   Calcium 7.8 (L) 8.9 - 10.3 mg/dL   Phosphorus 1.8 (L) 2.5 - 4.6 mg/dL   Albumin 2.7 (L) 3.5 - 5.0 g/dL   GFR calc non Af Amer 44 (L) >60 mL/min   GFR calc Af Amer 51 (L) >60 mL/min    Comment: (NOTE) The eGFR has been calculated using the CKD EPI equation. This calculation has not been validated in all clinical situations. eGFR's persistently <60 mL/min signify possible Chronic Kidney Disease.    Anion gap 7 5 - 15  CBC with Differential/Platelet     Status: Abnormal   Collection Time: 12/28/14  6:50 AM  Result Value Ref Range   WBC 8.2 3.8 - 10.6 K/uL   RBC 3.92 (L) 4.40 - 5.90 MIL/uL   Hemoglobin 11.9 (L) 13.0 - 18.0 g/dL   HCT 34.3 (L) 40.0 - 52.0 %   MCV 87.6 80.0 - 100.0 fL   MCH 30.5 26.0 - 34.0 pg   MCHC 34.8 32.0 - 36.0 g/dL   RDW 12.9 11.5 - 14.5 %   Platelets 201 150 - 440 K/uL   Neutrophils Relative % 76 %   Neutro Abs 6.2 1.4 - 6.5 K/uL   Lymphocytes Relative 10 %   Lymphs Abs 0.8 (L) 1.0 - 3.6 K/uL   Monocytes Relative 12 %   Monocytes Absolute 1.0 0.2 - 1.0 K/uL   Eosinophils Relative 2 %   Eosinophils Absolute 0.2 0 - 0.7 K/uL   Basophils Relative 0 %   Basophils Absolute 0.0 0 - 0.1 K/uL  Basic  metabolic panel     Status: Abnormal   Collection Time: 12/28/14  6:50 AM  Result Value Ref Range   Sodium 123 (L) 135 - 145 mmol/L   Potassium 3.5 3.5 - 5.1 mmol/L   Chloride 93 (L) 101 - 111 mmol/L   CO2 25 22 - 32 mmol/L   Glucose, Bld 106 (H) 65 - 99 mg/dL   BUN 16 6 - 20 mg/dL   Creatinine, Ser 1.21 0.61 - 1.24 mg/dL   Calcium 7.7 (L) 8.9 - 10.3 mg/dL   GFR calc non Af Amer 54 (L) >60 mL/min   GFR calc Af Amer >  60 >60 mL/min    Comment: (NOTE) The eGFR has been calculated using the CKD EPI equation. This calculation has not been validated in all clinical situations. eGFR's persistently <60 mL/min signify possible Chronic Kidney Disease.    Anion gap 5 5 - 15    Dg Chest 2 View  12/26/2014  CLINICAL DATA:  Patient c/o weakness. Pt to triage via w/c, actively vomiting; st yesterday rx Bactrim for kidney infection at [facility], now with N/V/D and lower abd pain. H/o HTN EXAM: CHEST - 2 VIEW COMPARISON:  03/20/2013 FINDINGS: Lungs clear, with relatively low volumes. Heart size normal. Mild aortic plaque. No pneumothorax. No effusion. Visualized skeletal structures are unremarkable. IMPRESSION: No acute cardiopulmonary disease. Electronically Signed   By: Lucrezia Europe M.D.   On: 12/26/2014 22:00    Review of Systems  Constitutional: Negative.        No fevers / malaise at present, had on admit  HENT: Negative.   Eyes: Negative.   Respiratory: Negative.   Cardiovascular: Negative.   Gastrointestinal: Negative.   Genitourinary: Negative.  Negative for dysuria, urgency, frequency, hematuria and flank pain.  Musculoskeletal: Negative.   Skin: Negative.   Neurological: Negative.   Endo/Heme/Allergies: Negative.   Psychiatric/Behavioral: Negative.    Blood pressure 117/57, pulse 72, temperature 98.3 F (36.8 C), temperature source Oral, resp. rate 18, height 6' (1.829 m), weight 87.998 kg (194 lb), SpO2 97 %. Physical Exam  Constitutional: He appears well-developed.   Wife at bedside  HENT:  Head: Normocephalic.  Eyes: Pupils are equal, round, and reactive to light.  Neck: Normal range of motion.  Cardiovascular: Normal rate.   Respiratory: Effort normal.  GI: Soft.  Genitourinary: Prostate normal and penis normal.  DRE 50gm smooth. No CVAT  Musculoskeletal: Normal range of motion.  Neurological: He is alert.  Skin: Skin is warm.  Psychiatric: He has a normal mood and affect. His behavior is normal. Judgment and thought content normal.    Assessment/Plan:  1 -Cystitis - now resolved infectious parameters. I feel OK for DC from GU perspective at anytime. Further w/u as outpatient.   2 - Chronic Renal Insufficiency - likely intrinsic renal disease and possibly some obstructive component given right hydro.   3 - Recurrent Nephrolithiasis - medical passage of stones x several previously. Has not seen Urologist previously. CT pending.   4 - Right Hydronephrosis - briefly outlined DDX stone, stricture, malignancy with wife and family and need for axial imaging with 3 phase CT (pre / post / delayed). This very well may be predisposing to UTI. Have discussed with primary team risks / benefits of contrast imaging and we both agree warranted.  5 - We will arrange GU follow up at [facility] in few weeks to review results / plan. Call with questions anytime.       Remberto Lienhard 12/28/2014, 9:26 AM

## 2014-12-29 LAB — BASIC METABOLIC PANEL
ANION GAP: 6 (ref 5–15)
BUN: 14 mg/dL (ref 6–20)
CO2: 29 mmol/L (ref 22–32)
Calcium: 8.2 mg/dL — ABNORMAL LOW (ref 8.9–10.3)
Chloride: 99 mmol/L — ABNORMAL LOW (ref 101–111)
Creatinine, Ser: 1.21 mg/dL (ref 0.61–1.24)
GFR calc Af Amer: >60 mL/min (ref >60)
GFR, EST NON AFRICAN AMERICAN: 54 mL/min — AB (ref >60)
GLUCOSE: 100 mg/dL — AB (ref 65–99)
POTASSIUM: 3.5 mmol/L (ref 3.5–5.1)
Sodium: 134 mmol/L — ABNORMAL LOW (ref 135–145)

## 2014-12-29 LAB — CBC WITH DIFFERENTIAL/PLATELET
BASOS ABS: 0 10*3/uL (ref 0–0.1)
Basophils Relative: 1 %
EOS PCT: 2 %
Eosinophils Absolute: 0.1 10*3/uL (ref 0–0.7)
HEMATOCRIT: 35.1 % — AB (ref 40.0–52.0)
Hemoglobin: 12.2 g/dL — ABNORMAL LOW (ref 13.0–18.0)
LYMPHS ABS: 1.1 10*3/uL (ref 1.0–3.6)
LYMPHS PCT: 16 %
MCH: 30.5 pg (ref 26.0–34.0)
MCHC: 34.7 g/dL (ref 32.0–36.0)
MCV: 87.8 fL (ref 80.0–100.0)
MONO ABS: 0.9 10*3/uL (ref 0.2–1.0)
Monocytes Relative: 15 %
NEUTROS ABS: 4.3 10*3/uL (ref 1.4–6.5)
Neutrophils Relative %: 66 %
PLATELETS: 232 10*3/uL (ref 150–440)
RBC: 3.99 MIL/uL — ABNORMAL LOW (ref 4.40–5.90)
RDW: 13 % (ref 11.5–14.5)
WBC: 6.4 10*3/uL (ref 3.8–10.6)

## 2014-12-29 MED ORDER — DOCUSATE SODIUM 100 MG PO CAPS
100.0000 mg | ORAL_CAPSULE | Freq: Two times a day (BID) | ORAL | Status: DC
  Administered 2014-12-29 (×2): 100 mg via ORAL
  Filled 2014-12-29 (×3): qty 1

## 2014-12-29 NOTE — Care Management (Signed)
Follow up PT assessment recommends no PT follow up.  Jason from [facility] notified of discontinued referral.

## 2014-12-29 NOTE — Progress Notes (Signed)
.   No c/o pain. Resting comfortably in bed throughout the day. No N/V.  2. Hemodynamically:  -VSS, afebrile, IVF infusing as ordered   -Na increased to 134  -Voiding without difficulty  3. Safely ambulates in room and around nursing station independently. Pt understands how to use call system for assistance

## 2014-12-29 NOTE — Plan of Care (Signed)
Problem: Discharge Progression Outcomes Goal: Other Discharge Outcomes/Goals Outcome: Progressing Plan of care progress to goals: 1. No c/o pain. Resting comfortably in bed throughout the night. No N/V.    2. Hemodynamically:             -VSS, afebrile, IVF infusing as ordered                -Na 123, will be rechecked this AM             -increased frequency in voiding throughout the night compared to the night before (1, 475 mL since 1900)             -CT Abd/Pelvis completed last night 3. Safely ambulates in room independently. Pt understands how to use call system for assistance

## 2014-12-29 NOTE — Progress Notes (Signed)
PROGRESS NOTE  Anthony Gonzales PYP:[id number] DOB: [birth year] DOA: 12/26/2014 PCP: Idelle Crouch, MD  Subjective  81 y/o male with hx of HTN, BPH, CKD and Hyperlipidemia admitted with UTI an d Hyponatremia This am feels better. Abdominal pain is less Se Sodium is 134    Objective: BP 141/66 mmHg  Pulse 74  Temp(Src) 98.8 F (37.1 C) (Oral)  Resp 18  Ht 6' (1.829 m)  Wt 87.998 kg (194 lb)  BMI 26.31 kg/m2  SpO2 95%  Intake/Output Summary (Last 24 hours) at 12/29/14 0826 Last data filed at 12/29/14 0400  Gross per 24 hour  Intake    600 ml  Output   3405 ml  Net  -2805 ml   Filed Weights   12/26/14 2028  Weight: 87.998 kg (194 lb)    Exam:  General: Well developed, well nourished male, in NAD HEENT: PERRL; OP moist without lesions. Neck: supple, trachea midline, no thyromegaly Chest: normal to palpation Lungs: clear bilaterally without retractions or wheezes Cardiovascular: RRR, no murmur, no gallop; distal pulses 2+ Abdomen: soft, nontender, nondistended, positive bowel sounds Extremities: no clubbing, cyanosis, edema Neuro: alert and oriented, moves all extremities Derm: no significant rashes or nodules; good skin turgor Lymph: no cervical or supraclavicular lymphadenopathy   Labs and imaging studies were reviewed*  Data Reviewed: Basic Metabolic Panel:  Recent Labs Lab 12/26/14 2040 12/27/14 0231 12/27/14 1423 12/28/14 0650 12/29/14 0520  NA 118* 120* 118* 123* 134*  K 3.7 3.6 3.7 3.5 3.5  CL 86* 88* 86* 93* 99*  CO2 '24 25 25 25 29  '$ GLUCOSE 130* 124* 152* 106* 100*  BUN 22* 22* '20 16 14  '$ CREATININE 1.60* 1.52* 1.45* 1.21 1.21  CALCIUM 8.2* 7.8* 7.8* 7.7* 8.2*  MG 1.5*  --   --   --   --   PHOS 1.7*  --  1.8*  --   --    Liver Function Tests:  Recent Labs Lab 12/26/14 2040 12/27/14 1423  AST 32  --   ALT 20  --   ALKPHOS 45  --   BILITOT 0.9  --   PROT 6.5  --   ALBUMIN 3.3* 2.7*    Recent Labs Lab 12/26/14 2040   LIPASE 25   No results for input(s): AMMONIA in the last 168 hours. CBC:  Recent Labs Lab 12/26/14 2040 12/27/14 0231 12/28/14 0650 12/29/14 0520  WBC 21.1* 14.9* 8.2 6.4  NEUTROABS  --   --  6.2 4.3  HGB 12.5* 11.7* 11.9* 12.2*  HCT 37.0* 34.0* 34.3* 35.1*  MCV 88.0 87.5 87.6 87.8  PLT 198 168 201 232   Cardiac Enzymes:   No results for input(s): CKTOTAL, CKMB, CKMBINDEX, TROPONINI in the last 168 hours. BNP (last 3 results) No results for input(s): BNP in the last 8760 hours.  ProBNP (last 3 results) No results for input(s): PROBNP in the last 8760 hours.  CBG: No results for input(s): GLUCAP in the last 168 hours.  Recent Results (from the past 240 hour(s))  Urine culture     Status: None   Collection Time: 12/25/14  1:09 PM  Result Value Ref Range Status   Specimen Description URINE, CLEAN CATCH  Final   Special Requests Normal  Final   Culture >=100,000 COLONIES/mL ESCHERICHIA COLI  Final   Report Status 12/27/2014 FINAL  Final   Organism ID, Bacteria ESCHERICHIA COLI  Final      Susceptibility   Escherichia coli - MIC*  AMPICILLIN <=2 SENSITIVE Sensitive     CEFAZOLIN <=4 SENSITIVE Sensitive     CEFTRIAXONE <=1 SENSITIVE Sensitive     CIPROFLOXACIN <=0.25 SENSITIVE Sensitive     GENTAMICIN <=1 SENSITIVE Sensitive     IMIPENEM <=0.25 SENSITIVE Sensitive     NITROFURANTOIN <=16 SENSITIVE Sensitive     TRIMETH/SULFA <=20 SENSITIVE Sensitive     PIP/TAZO Value in next row Sensitive      SENSITIVE<=4    ERTAPENEM Value in next row Sensitive      SENSITIVE<=0.5    LEVOFLOXACIN Value in next row Sensitive      SENSITIVE<=0.12    * >=100,000 COLONIES/mL ESCHERICHIA COLI  Urine culture     Status: None   Collection Time: 12/26/14  9:31 PM  Result Value Ref Range Status   Specimen Description URINE, RANDOM  Final   Special Requests NONE  Final   Culture 90,000 COLONIES/ml ESCHERICHIA COLI  Final   Report Status 12/28/2014 FINAL  Final   Organism ID,  Bacteria ESCHERICHIA COLI  Final      Susceptibility   Escherichia coli - MIC*    AMPICILLIN <=2 SENSITIVE Sensitive     CEFTAZIDIME <=1 SENSITIVE Sensitive     CEFAZOLIN <=4 SENSITIVE Sensitive     CEFTRIAXONE <=1 SENSITIVE Sensitive     CIPROFLOXACIN <=0.25 SENSITIVE Sensitive     GENTAMICIN <=1 SENSITIVE Sensitive     IMIPENEM <=0.25 SENSITIVE Sensitive     TRIMETH/SULFA <=20 SENSITIVE Sensitive     NITROFURANTOIN Value in next row Sensitive      SENSITIVE<=16    PIP/TAZO Value in next row Sensitive      SENSITIVE<=4    ERTAPENEM Value in next row Sensitive      SENSITIVE<=0.5    LEVOFLOXACIN Value in next row Sensitive      SENSITIVE<=0.12    * 90,000 COLONIES/ml ESCHERICHIA COLI  Culture, blood (routine x 2)     Status: None (Preliminary result)   Collection Time: 12/26/14  9:45 PM  Result Value Ref Range Status   Specimen Description BLOOD BLOOD LEFT FOREARM  Final   Special Requests BOTTLES DRAWN AEROBIC AND ANAEROBIC 5CC  Final   Culture NO GROWTH 2 DAYS  Final   Report Status PENDING  Incomplete  Culture, blood (routine x 2)     Status: None (Preliminary result)   Collection Time: 12/27/14  2:25 AM  Result Value Ref Range Status   Specimen Description BLOOD RIGHT ANTECUBITAL  Final   Special Requests BOTTLES DRAWN AEROBIC AND ANAEROBIC [place]  Final   Culture NO GROWTH 1 DAY  Final   Report Status PENDING  Incomplete     Studies: Ct Abdomen Pelvis W Wo Contrast  12/28/2014  CLINICAL DATA:  Follow-up hydronephrosis noted on ultrasound. Initial encounter. EXAM: CT ABDOMEN AND PELVIS WITHOUT AND WITH CONTRAST TECHNIQUE: Multidetector CT imaging of the abdomen and pelvis was performed following the standard protocol before and following the bolus administration of intravenous contrast. CONTRAST:  122m OMNIPAQUE IOHEXOL 300 MG/ML  SOLN COMPARISON:  None. FINDINGS: Trace bilateral pleural effusions are noted. Trace pericardial fluid remains within normal limits. Scattered  coronary calcifications are seen. The liver and spleen are unremarkable in appearance. The gallbladder is within normal limits. The pancreas and adrenal glands are unremarkable. There is moderate right-sided hydronephrosis, with diffuse prominence of the right ureter. No obstructing stone is seen. This may reflect some degree of obstruction due to the enlarged prostate or a distal ureteral stricture. Diffuse bilateral  perinephric stranding and fluid are seen. A 2.7 cm cyst is noted at the lower pole of the right kidney. No renal or ureteral stones are identified. Trace free fluid is noted about the spleen. The small bowel is unremarkable in appearance. The stomach is within normal limits. No acute vascular abnormalities are seen. Scattered calcification is noted along the abdominal aorta and its branches. The appendix is normal in caliber, without evidence of appendicitis. Scattered diverticulosis is noted along the proximal sigmoid colon, without evidence of diverticulitis. The bladder is moderately distended and grossly unremarkable. A small urachal remnant is incidentally seen. The prostate is enlarged, measuring 5.5 cm in transverse dimension. No inguinal lymphadenopathy is seen. No acute osseous abnormalities are identified. Multilevel disc space narrowing and vacuum phenomenon are noted along the lower lumbar spine. IMPRESSION: 1. Moderate right-sided hydronephrosis, with diffuse prominence of the right ureter. This may reflect some degree of obstruction due to the enlarged prostate or distal ureteral stricture. Diffuse bilateral perinephric stranding and fluid seen. No renal or ureteral stones identified. 2. Trace bilateral pleural effusions noted. 3. Scattered coronary artery calcifications seen. 4. Scattered calcification along the abdominal aorta and its branches. 5. Trace free fluid noted about the spleen. 6. Scattered diverticulosis along the proximal sigmoid colon, without evidence of diverticulitis. 7.  Enlarged prostate noted. 8. Minimal degenerative change along the lower lumbar spine. Electronically Signed   By: Garald Balding M.D.   On: 12/28/2014 22:59   US Abdomen Complete  12/28/2014  CLINICAL DATA:  Abdominal pain and nausea EXAM: ULTRASOUND ABDOMEN COMPLETE COMPARISON:  03/21/2013 FINDINGS: Gallbladder: Several mobile echogenic foci the 2 largest measuring 9 mm and 14 mm respectively consistent with stones. There is no sonographic Murphy sign. There are portions of the gallbladder wall that appear thickened up to 5 mm. Common bile duct: Diameter: 5 mm Liver: No focal lesion identified. Within normal limits in parenchymal echogenicity. IVC: No abnormality visualized. Pancreas: Grossly negative but only seen in limited detail Spleen: Size and appearance within normal limits. Right Kidney: Length: 11.6 cm. Limited detail of the right kidney appears to be show moderate right hydronephrosis. There is a 2.6 cm lower pole cyst. Left Kidney: Length: 11.6 cm. Limited detail but no evidence of mass stone or hydronephrosis. Abdominal aorta: No aneurysm visualized. Other findings: None. IMPRESSION: 1. Cholelithiasis. Nonspecific gallbladder wall thickening. No sonographic Murphy sign. 2. Findings suggest hydronephrosis right kidney 3. Suggest CT scan without and with contrast to evaluate further the above findings. Electronically Signed   By: Skipper Cliche M.D.   On: 12/28/2014 10:37    Scheduled Meds: . benazepril  20 mg Oral Daily   And  . hydrochlorothiazide  12.5 mg Oral Daily  . cefTRIAXone (ROCEPHIN)  IV  1 g Intravenous Q24H  . dorzolamide-timolol  1 drop Left Eye Daily  . fenofibrate  160 mg Oral Daily  . heparin  5,000 Units Subcutaneous 3 times per day  . sodium chloride  3 mL Intravenous Q12H  . timolol  1-2 drop Both Eyes BID    Continuous Infusions: . sodium chloride 100 mL/hr at 12/28/14 2337    Assessment/Plan:  1  Nausea and vomiting from E. Coli  UTI ; Doing better- on IV  Rocephin Gall stones  And rt kidney Hydronephrosis Urology consult pending 2 Hyponatremia; Se sodium improved to 134 3 HTN : Continue Benazep ril and HCTZ 4 HLD : On Fenofibrate 5 Acute Renal Failure- Se creat improved to 1.21 6 Physical Therapy  Code Status: Full         12/29/2014, 8:26 AM  LOS: 3 days

## 2014-12-30 LAB — CBC WITH DIFFERENTIAL/PLATELET
BASOS ABS: 0 10*3/uL (ref 0–0.1)
BASOS PCT: 1 %
EOS ABS: 0.2 10*3/uL (ref 0–0.7)
Eosinophils Relative: 4 %
HCT: 34.9 % — ABNORMAL LOW (ref 40.0–52.0)
HEMOGLOBIN: 12 g/dL — AB (ref 13.0–18.0)
Lymphocytes Relative: 21 %
Lymphs Abs: 1.1 10*3/uL (ref 1.0–3.6)
MCH: 30.8 pg (ref 26.0–34.0)
MCHC: 34.4 g/dL (ref 32.0–36.0)
MCV: 89.6 fL (ref 80.0–100.0)
Monocytes Absolute: 0.9 10*3/uL (ref 0.2–1.0)
Monocytes Relative: 16 %
NEUTROS ABS: 3.2 10*3/uL (ref 1.4–6.5)
NEUTROS PCT: 58 %
Platelets: 231 10*3/uL (ref 150–440)
RBC: 3.89 MIL/uL — AB (ref 4.40–5.90)
RDW: 13.6 % (ref 11.5–14.5)
WBC: 5.5 10*3/uL (ref 3.8–10.6)

## 2014-12-30 LAB — BASIC METABOLIC PANEL
Anion gap: 4 — ABNORMAL LOW (ref 5–15)
BUN: 14 mg/dL (ref 6–20)
CALCIUM: 8 mg/dL — AB (ref 8.9–10.3)
CO2: 28 mmol/L (ref 22–32)
CREATININE: 1.1 mg/dL (ref 0.61–1.24)
Chloride: 103 mmol/L (ref 101–111)
GFR calc non Af Amer: >60 mL/min (ref >60)
GLUCOSE: 95 mg/dL (ref 65–99)
Potassium: 3.7 mmol/L (ref 3.5–5.1)
Sodium: 135 mmol/L (ref 135–145)

## 2014-12-30 MED ORDER — CEFUROXIME AXETIL 250 MG PO TABS: 250.0000 mg | ORAL_TABLET | Freq: Two times a day (BID) | ORAL | Status: DC

## 2014-12-30 NOTE — Plan of Care (Signed)
Problem: Discharge Progression Outcomes Goal: Other Discharge Outcomes/Goals Plan of care progress to goal: VSS this shift Diet: good appetite Activity: ambulated in the hall Planning discharge home today

## 2014-12-30 NOTE — Discharge Summary (Signed)
Physician Discharge Summary  Anthony Gonzales TKP:[id number] DOB: [birth year] DOA: 12/26/2014  PCP: Idelle Crouch, MD  Admit date: 12/26/2014 Discharge date: 12/30/2014  Time spent: 35 minutes  Recommendations for Outpatient Follow-up:  1. D/c Home today. 2. Follow up with Dr. Doy Hutching 3. Follow up with [facility] 4. Call office to make appt in 1 week  Discharge Diagnoses:   1 E.Coli UTI (lower urinary tract infection) 2  Hyponatremia 3 HTN (hypertension)4  4  HLD (hyperlipidemia) 5  BPH (benign prostatic hyperplasia) 6  History of Glaucoma       Discharge Condition: Stable  Diet recommendation: 2 gms sodium   Filed Weights   12/26/14 2028  Weight: 87.998 kg (194 lb)    History of present illness:  Anthony Gonzales is a 81 year old male, with a history of hypertension and hyperlipidemia and BPH presents to ED complaining of vomiting diarrhea and weakness. Patient had been seen with in the walk-in clinic for UTI started on Bactrim but he became progressively weak. On admission patient's serum sodium was also notably low at 118.   Hospital Course:  Patient was admitted [facility] and received intravenous antibiotics with Rocephin he was also seen in consultation by gastroenterologist Dr.Wohl and urologist Dr. Tresa Moore. His diarrhea settled  And his serum sodium gradually improved. No further intervention was recommended by GI. An ultrasound did show evidence for gallstones and right-sided hydronephrosis Dr. Tresa Moore felt that kidney stone could be contacted regarding to his recurrent UTIs. Advise a follow-up appointment with urologist outpatient Patient gradually improved his serum sodium came back to 135. Patient's urine culture was positive for Escherichia coli He was ambulated and discharged home in stable condition and advised complete a course of by mouth antibiotics with Ceftin. He has been advised follow-up with Dr. Doy Hutching and call  the office with any questions or concerns  Procedures:  Consultations:    GI- Dr,Wohl- GI  Dr.Manny- Urology  Discharge Exam: Filed Vitals:   12/30/14 0953  BP: 147/73  Pulse: 71  Temp: 98.4 F (36.9 C)  Resp:     General: Not in disrtress Cardiovascular: S1 S2 Respiratory: Clear to Auscultation  Discharge Instructions   Discharge Instructions    Diet - low sodium heart healthy    Complete by:  As directed      Discharge instructions    Complete by:  As directed   D/c Home today. Follow up with Dr. Doy Hutching. Drink plenty of fluids Call office to make appt in 1-2 weeks          Current Discharge Medication List    START taking these medications   Details  cefUROXime (CEFTIN) 250 MG tablet Take 1 tablet (250 mg total) by mouth 2 (two) times daily with a meal. Qty: 14 tablet, Refills: 0      CONTINUE these medications which have NOT CHANGED   Details  benazepril-hydrochlorthiazide (LOTENSIN HCT) 20-12.5 MG tablet Take 1 tablet by mouth daily.    Dorzolamide HCl-Timolol Mal PF 22.3-6.8 MG/ML SOLN Apply 1 drop to eye daily.    fenofibrate 160 MG tablet Take 160 mg by mouth daily.    mometasone (NASONEX) 50 MCG/ACT nasal spray Place 2 sprays into the nose daily. Qty: 17 g, Refills: 12    timolol (BETIMOL) 0.25 % ophthalmic solution Place 1-2 drops into both eyes 2 (two) times daily.      STOP taking these medications     sulfamethoxazole-trimethoprim (BACTRIM) 400-80 MG tablet  Allergies  Allergen Reactions  . Demerol [Meperidine] Nausea And Vomiting      The results of significant diagnostics from this hospitalization (including imaging, microbiology, ancillary and laboratory) are listed below for reference.    Significant Diagnostic Studies: Ct Abdomen Pelvis W Wo Contrast  12/28/2014  CLINICAL DATA:  Follow-up hydronephrosis noted on ultrasound. Initial encounter. EXAM: CT ABDOMEN AND PELVIS WITHOUT AND WITH CONTRAST TECHNIQUE:  Multidetector CT imaging of the abdomen and pelvis was performed following the standard protocol before and following the bolus administration of intravenous contrast. CONTRAST:  141m OMNIPAQUE IOHEXOL 300 MG/ML  SOLN COMPARISON:  None. FINDINGS: Trace bilateral pleural effusions are noted. Trace pericardial fluid remains within normal limits. Scattered coronary calcifications are seen. The liver and spleen are unremarkable in appearance. The gallbladder is within normal limits. The pancreas and adrenal glands are unremarkable. There is moderate right-sided hydronephrosis, with diffuse prominence of the right ureter. No obstructing stone is seen. This may reflect some degree of obstruction due to the enlarged prostate or a distal ureteral stricture. Diffuse bilateral perinephric stranding and fluid are seen. A 2.7 cm cyst is noted at the lower pole of the right kidney. No renal or ureteral stones are identified. Trace free fluid is noted about the spleen. The small bowel is unremarkable in appearance. The stomach is within normal limits. No acute vascular abnormalities are seen. Scattered calcification is noted along the abdominal aorta and its branches. The appendix is normal in caliber, without evidence of appendicitis. Scattered diverticulosis is noted along the proximal sigmoid colon, without evidence of diverticulitis. The bladder is moderately distended and grossly unremarkable. A small urachal remnant is incidentally seen. The prostate is enlarged, measuring 5.5 cm in transverse dimension. No inguinal lymphadenopathy is seen. No acute osseous abnormalities are identified. Multilevel disc space narrowing and vacuum phenomenon are noted along the lower lumbar spine. IMPRESSION: 1. Moderate right-sided hydronephrosis, with diffuse prominence of the right ureter. This may reflect some degree of obstruction due to the enlarged prostate or distal ureteral stricture. Diffuse bilateral perinephric stranding and  fluid seen. No renal or ureteral stones identified. 2. Trace bilateral pleural effusions noted. 3. Scattered coronary artery calcifications seen. 4. Scattered calcification along the abdominal aorta and its branches. 5. Trace free fluid noted about the spleen. 6. Scattered diverticulosis along the proximal sigmoid colon, without evidence of diverticulitis. 7. Enlarged prostate noted. 8. Minimal degenerative change along the lower lumbar spine. Electronically Signed   By: JGarald BaldingM.D.   On: 12/28/2014 22:59   Dg Chest 2 View  12/26/2014  CLINICAL DATA:  Patient c/o weakness. Pt to triage via w/c, actively vomiting; st [facility], now with N/V/D and lower abd pain. H/o HTN EXAM: CHEST - 2 VIEW COMPARISON:  03/20/2013 FINDINGS: Lungs clear, with relatively low volumes. Heart size normal. Mild aortic plaque. No pneumothorax. No effusion. Visualized skeletal structures are unremarkable. IMPRESSION: No acute cardiopulmonary disease. Electronically Signed   By: DLucrezia EuropeM.D.   On: 12/26/2014 22:00   [place]oreaAbdomen Complete  12/28/2014  CLINICAL DATA:  Abdominal pain and nausea EXAM: ULTRASOUND ABDOMEN COMPLETE COMPARISON:  03/21/2013 FINDINGS: Gallbladder: Several mobile echogenic foci the 2 largest measuring 9 mm and 14 mm respectively consistent with stones. There is no sonographic Murphy sign. There are portions of the gallbladder wall that appear thickened up to 5 mm. Common bile duct: Diameter: 5 mm Liver: No focal lesion identified. Within normal limits in parenchymal echogenicity.  IVC: No abnormality visualized. Pancreas: Grossly negative but only seen in limited detail Spleen: Size and appearance within normal limits. Right Kidney: Length: 11.6 cm. Limited detail of the right kidney appears to be show moderate right hydronephrosis. There is a 2.6 cm lower pole cyst. Left Kidney: Length: 11.6 cm. Limited detail but no evidence of mass stone or  hydronephrosis. Abdominal aorta: No aneurysm visualized. Other findings: None. IMPRESSION: 1. Cholelithiasis. Nonspecific gallbladder wall thickening. No sonographic Murphy sign. 2. Findings suggest hydronephrosis right kidney 3. Suggest CT scan without and with contrast to evaluate further the above findings. Electronically Signed   By: Skipper Cliche M.D.   On: 12/28/2014 10:37    Microbiology: Recent Results (from the past 240 hour(s))  Urine culture     Status: None   Collection Time: 12/25/14  1:09 PM  Result Value Ref Range Status   Specimen Description URINE, CLEAN CATCH  Final   Special Requests Normal  Final   Culture >=100,000 COLONIES/mL ESCHERICHIA COLI  Final   Report Status 12/27/2014 FINAL  Final   Organism ID, Bacteria ESCHERICHIA COLI  Final      Susceptibility   Escherichia coli - MIC*    AMPICILLIN <=2 SENSITIVE Sensitive     CEFAZOLIN <=4 SENSITIVE Sensitive     CEFTRIAXONE <=1 SENSITIVE Sensitive     CIPROFLOXACIN <=0.25 SENSITIVE Sensitive     GENTAMICIN <=1 SENSITIVE Sensitive     IMIPENEM <=0.25 SENSITIVE Sensitive     NITROFURANTOIN <=16 SENSITIVE Sensitive     TRIMETH/SULFA <=20 SENSITIVE Sensitive     PIP/TAZO Value in next row Sensitive      SENSITIVE<=4    ERTAPENEM Value in next row Sensitive      SENSITIVE<=0.5    LEVOFLOXACIN Value in next row Sensitive      SENSITIVE<=0.12    * >=100,000 COLONIES/mL ESCHERICHIA COLI  Urine culture     Status: None   Collection Time: 12/26/14  9:31 PM  Result Value Ref Range Status   Specimen Description URINE, RANDOM  Final   Special Requests NONE  Final   Culture 90,000 COLONIES/ml ESCHERICHIA COLI  Final   Report Status 12/28/2014 FINAL  Final   Organism ID, Bacteria ESCHERICHIA COLI  Final      Susceptibility   Escherichia coli - MIC*    AMPICILLIN <=2 SENSITIVE Sensitive     CEFTAZIDIME <=1 SENSITIVE Sensitive     CEFAZOLIN <=4 SENSITIVE Sensitive     CEFTRIAXONE <=1 SENSITIVE Sensitive      CIPROFLOXACIN <=0.25 SENSITIVE Sensitive     GENTAMICIN <=1 SENSITIVE Sensitive     IMIPENEM <=0.25 SENSITIVE Sensitive     TRIMETH/SULFA <=20 SENSITIVE Sensitive     NITROFURANTOIN Value in next row Sensitive      SENSITIVE<=16    PIP/TAZO Value in next row Sensitive      SENSITIVE<=4    ERTAPENEM Value in next row Sensitive      SENSITIVE<=0.5    LEVOFLOXACIN Value in next row Sensitive      SENSITIVE<=0.12    * 90,000 COLONIES/ml ESCHERICHIA COLI  Culture, blood (routine x 2)     Status: None (Preliminary result)   Collection Time: 12/26/14  9:45 PM  Result Value Ref Range Status   Specimen Description BLOOD BLOOD LEFT FOREARM  Final   Special Requests BOTTLES DRAWN AEROBIC AND ANAEROBIC 5CC  Final   Culture NO GROWTH 4 DAYS  Final   Report Status PENDING  Incomplete  Culture, blood (routine x  2)     Status: None (Preliminary result)   Collection Time: 12/27/14  2:25 AM  Result Value Ref Range Status   Specimen Description BLOOD RIGHT ANTECUBITAL  Final   Special Requests BOTTLES DRAWN AEROBIC AND ANAEROBIC [facility]  Final   Culture NO GROWTH 3 DAYS  Final   Report Status PENDING  Incomplete     Labs: Basic Metabolic Panel:  Recent Labs Lab 12/26/14 2040 12/27/14 0231 12/27/14 1423 12/28/14 0650 12/29/14 0520 12/30/14 0455  NA 118* 120* 118* 123* 134* 135  K 3.7 3.6 3.7 3.5 3.5 3.7  CL 86* 88* 86* 93* 99* 103  CO2 '24 25 25 25 29 28  '$ GLUCOSE 130* 124* 152* 106* 100* 95  BUN 22* 22* '20 16 14 14  '$ CREATININE 1.60* 1.52* 1.45* 1.21 1.21 1.10  CALCIUM 8.2* 7.8* 7.8* 7.7* 8.2* 8.0*  MG 1.5*  --   --   --   --   --   PHOS 1.7*  --  1.8*  --   --   --    Liver Function Tests:  Recent Labs Lab 12/26/14 2040 12/27/14 1423  AST 32  --   ALT 20  --   ALKPHOS 45  --   BILITOT 0.9  --   PROT 6.5  --   ALBUMIN 3.3* 2.7*    Recent Labs Lab 12/26/14 2040  LIPASE 25   No results for input(s): AMMONIA in the last 168 hours. CBC:  Recent Labs Lab 12/26/14 2040  12/27/14 0231 12/28/14 0650 12/29/14 0520 12/30/14 0455  WBC 21.1* 14.9* 8.2 6.4 5.5  NEUTROABS  --   --  6.2 4.3 3.2  HGB 12.5* 11.7* 11.9* 12.2* 12.0*  HCT 37.0* 34.0* 34.3* 35.1* 34.9*  MCV 88.0 87.5 87.6 87.8 89.6  PLT 198 168 201 232 231   Cardiac Enzymes: No results for input(s): CKTOTAL, CKMB, CKMBINDEX, TROPONINI in the last 168 hours. BNP: BNP (last 3 results) No results for input(s): BNP in the last 8760 hours.  ProBNP (last 3 results) No results for input(s): PROBNP in the last 8760 hours.  CBG: No results for input(s): GLUCAP in the last 168 hours.     SignedTracie Harrier   12/30/2014, 1:06 PM

## 2014-12-30 NOTE — Progress Notes (Signed)
Discharge orders put in by MD, all discharge instructions reviewed with pt, he voiced understanding. Pt left in wheelchair accompanied by family and staff.

## 2014-12-30 NOTE — Progress Notes (Signed)
PROGRESS NOTE  Anthony Gonzales ZHY:[id number] DOB: 1931-11-27 DOA: 12/26/2014 PCP: Idelle Crouch, MD  Subjective  81 y/o male with hx of HTN, BPH, CKD and Hyperlipidemia admitted with UTI an d Hyponatremia This am feels better. Abdominal pain is less Se Sodium improved to 135    Objective: BP 147/73 mmHg  Pulse 71  Temp(Src) 98.4 F (36.9 C) (Oral)  Resp 18  Ht 6' (1.829 m)  Wt 87.998 kg (194 lb)  BMI 26.31 kg/m2  SpO2 95%  Intake/Output Summary (Last 24 hours) at 12/30/14 1142 Last data filed at 12/30/14 0748  Gross per 24 hour  Intake    710 ml  Output      0 ml  Net    710 ml   Filed Weights   12/26/14 2028  Weight: 87.998 kg (194 lb)    Exam:  General: Well developed, well nourished male, in NAD HEENT: PERRL; OP moist without lesions. Neck: supple, trachea midline, no thyromegaly Chest: normal to palpation Lungs: clear bilaterally without retractions or wheezes Cardiovascular: RRR, no murmur Abdomen: soft, nontender, nondistended, positive bowel sounds Extremities: no clubbing, cyanosis, edema Neuro: alert and oriented, moves all extremities Derm: no significant rashes or nodules; good skin turgor Lymph: no cervical or supraclavicular lymphadenopathy   Labs and imaging studies were reviewed*  Data Reviewed: Basic Metabolic Panel:  Recent Labs Lab 12/26/14 2040 12/27/14 0231 12/27/14 1423 12/28/14 0650 12/29/14 0520 12/30/14 0455  NA 118* 120* 118* 123* 134* 135  K 3.7 3.6 3.7 3.5 3.5 3.7  CL 86* 88* 86* 93* 99* 103  CO2 '24 25 25 25 29 28  '$ GLUCOSE 130* 124* 152* 106* 100* 95  BUN 22* 22* '20 16 14 14  '$ CREATININE 1.60* 1.52* 1.45* 1.21 1.21 1.10  CALCIUM 8.2* 7.8* 7.8* 7.7* 8.2* 8.0*  MG 1.5*  --   --   --   --   --   PHOS 1.7*  --  1.8*  --   --   --    Liver Function Tests:  Recent Labs Lab 12/26/14 2040 12/27/14 1423  AST 32  --   ALT 20  --   ALKPHOS 45  --   BILITOT 0.9  --   PROT 6.5  --   ALBUMIN 3.3* 2.7*     Recent Labs Lab 12/26/14 2040  LIPASE 25   No results for input(s): AMMONIA in the last 168 hours. CBC:  Recent Labs Lab 12/26/14 2040 12/27/14 0231 12/28/14 0650 12/29/14 0520 12/30/14 0455  WBC 21.1* 14.9* 8.2 6.4 5.5  NEUTROABS  --   --  6.2 4.3 3.2  HGB 12.5* 11.7* 11.9* 12.2* 12.0*  HCT 37.0* 34.0* 34.3* 35.1* 34.9*  MCV 88.0 87.5 87.6 87.8 89.6  PLT 198 168 201 232 231   Cardiac Enzymes:   No results for input(s): CKTOTAL, CKMB, CKMBINDEX, TROPONINI in the last 168 hours. BNP (last 3 results) No results for input(s): BNP in the last 8760 hours.  ProBNP (last 3 results) No results for input(s): PROBNP in the last 8760 hours.  CBG: No results for input(s): GLUCAP in the last 168 hours.  Recent Results (from the past 240 hour(s))  Urine culture     Status: None   Collection Time: 12/25/14  1:09 PM  Result Value Ref Range Status   Specimen Description URINE, CLEAN CATCH  Final   Special Requests Normal  Final   Culture >=100,000 COLONIES/mL ESCHERICHIA COLI  Final   Report Status 12/27/2014 FINAL  Final   Organism ID, Bacteria ESCHERICHIA COLI  Final      Susceptibility   Escherichia coli - MIC*    AMPICILLIN <=2 SENSITIVE Sensitive     CEFAZOLIN <=4 SENSITIVE Sensitive     CEFTRIAXONE <=1 SENSITIVE Sensitive     CIPROFLOXACIN <=0.25 SENSITIVE Sensitive     GENTAMICIN <=1 SENSITIVE Sensitive     IMIPENEM <=0.25 SENSITIVE Sensitive     NITROFURANTOIN <=16 SENSITIVE Sensitive     TRIMETH/SULFA <=20 SENSITIVE Sensitive     PIP/TAZO Value in next row Sensitive      SENSITIVE<=4    ERTAPENEM Value in next row Sensitive      SENSITIVE<=0.5    LEVOFLOXACIN Value in next row Sensitive      SENSITIVE<=0.12    * >=100,000 COLONIES/mL ESCHERICHIA COLI  Urine culture     Status: None   Collection Time: 12/26/14  9:31 PM  Result Value Ref Range Status   Specimen Description URINE, RANDOM  Final   Special Requests NONE  Final   Culture 90,000 COLONIES/ml  ESCHERICHIA COLI  Final   Report Status 12/28/2014 FINAL  Final   Organism ID, Bacteria ESCHERICHIA COLI  Final      Susceptibility   Escherichia coli - MIC*    AMPICILLIN <=2 SENSITIVE Sensitive     CEFTAZIDIME <=1 SENSITIVE Sensitive     CEFAZOLIN <=4 SENSITIVE Sensitive     CEFTRIAXONE <=1 SENSITIVE Sensitive     CIPROFLOXACIN <=0.25 SENSITIVE Sensitive     GENTAMICIN <=1 SENSITIVE Sensitive     IMIPENEM <=0.25 SENSITIVE Sensitive     TRIMETH/SULFA <=20 SENSITIVE Sensitive     NITROFURANTOIN Value in next row Sensitive      SENSITIVE<=16    PIP/TAZO Value in next row Sensitive      SENSITIVE<=4    ERTAPENEM Value in next row Sensitive      SENSITIVE<=0.5    LEVOFLOXACIN Value in next row Sensitive      SENSITIVE<=0.12    * 90,000 COLONIES/ml ESCHERICHIA COLI  Culture, blood (routine x 2)     Status: None (Preliminary result)   Collection Time: 12/26/14  9:45 PM  Result Value Ref Range Status   Specimen Description BLOOD BLOOD LEFT FOREARM  Final   Special Requests BOTTLES DRAWN AEROBIC AND ANAEROBIC 5CC  Final   Culture NO GROWTH 4 DAYS  Final   Report Status PENDING  Incomplete  Culture, blood (routine x 2)     Status: None (Preliminary result)   Collection Time: 12/27/14  2:25 AM  Result Value Ref Range Status   Specimen Description BLOOD RIGHT ANTECUBITAL  Final   Special Requests BOTTLES DRAWN AEROBIC AND ANAEROBIC [place]  Final   Culture NO GROWTH 3 DAYS  Final   Report Status PENDING  Incomplete     Studies: No results found.  Scheduled Meds: . benazepril  20 mg Oral Daily   And  . hydrochlorothiazide  12.5 mg Oral Daily  . cefTRIAXone (ROCEPHIN)  IV  1 g Intravenous Q24H  . docusate sodium  100 mg Oral BID  . dorzolamide-timolol  1 drop Left Eye Daily  . fenofibrate  160 mg Oral Daily  . heparin  5,000 Units Subcutaneous 3 times per day  . sodium chloride  3 mL Intravenous Q12H  . timolol  1-2 drop Both Eyes BID    Continuous Infusions: . sodium chloride  100 mL/hr at 12/29/14 2020    Assessment/Plan:  1  Nausea and vomiting from E.  Coli  UTI ; Doing better- on IV Rocephin Gall stones and rt kidney Hydronephrosis. Pt is feeling better and renal function is normal 2 Hyponatremia; Se sodium improved to 135 3 HTN : Continue Benazepril and HCTZ 4 HLD : On Fenofibrate 5 Acute Renal Failure- Se creat improved to 1.10. D/c Home today on Po Ceftin Follow up with Dr. Doy Hutching      Code Status: Full         12/30/2014, 11:42 AM  LOS: 4 days

## 2014-12-31 LAB — CULTURE, BLOOD (ROUTINE X 2): CULTURE: NO GROWTH

## 2015-01-02 ENCOUNTER — Other Ambulatory Visit: Payer: Self-pay | Admitting: Internal Medicine

## 2015-01-02 DIAGNOSIS — N1330 Unspecified hydronephrosis: Secondary | ICD-10-CM

## 2015-01-02 LAB — CULTURE, BLOOD (ROUTINE X 2): CULTURE: NO GROWTH

## 2015-01-08 ENCOUNTER — Ambulatory Visit
Admission: RE | Admit: 2015-01-08 | Discharge: 2015-01-08 | Disposition: A | Discharge status: Home / Self Care | Payer: PPO | Source: Ambulatory Visit | Attending: Internal Medicine | Admitting: Internal Medicine

## 2015-01-08 DIAGNOSIS — N1330 Unspecified hydronephrosis: Secondary | ICD-10-CM | POA: Diagnosis not present

## 2015-01-08 DIAGNOSIS — N281 Cyst of kidney, acquired: Secondary | ICD-10-CM | POA: Diagnosis not present

## 2015-01-16 ENCOUNTER — Ambulatory Visit (INDEPENDENT_AMBULATORY_CARE_PROVIDER_SITE_OTHER): Payer: PPO | Admitting: Urology

## 2015-01-16 ENCOUNTER — Encounter: Payer: Self-pay | Admitting: Urology

## 2015-01-16 VITALS — BP 147/74 | HR 89 | Ht 72.0 in | Wt 193.0 lb

## 2015-01-16 DIAGNOSIS — N1330 Unspecified hydronephrosis: Secondary | ICD-10-CM

## 2015-01-16 DIAGNOSIS — N390 Urinary tract infection, site not specified: Secondary | ICD-10-CM | POA: Diagnosis not present

## 2015-01-16 DIAGNOSIS — N200 Calculus of kidney: Secondary | ICD-10-CM

## 2015-01-16 DIAGNOSIS — R3915 Urgency of urination: Secondary | ICD-10-CM

## 2015-01-16 LAB — URINALYSIS, COMPLETE
BILIRUBIN UA: NEGATIVE
GLUCOSE, UA: NEGATIVE
KETONES UA: NEGATIVE
LEUKOCYTES UA: NEGATIVE
Nitrite, UA: NEGATIVE
PROTEIN UA: NEGATIVE
SPEC GRAV UA: 1.015 (ref 1.005–1.030)
Urobilinogen, Ur: 0.2 mg/dL (ref 0.2–1.0)
pH, UA: 5.5 (ref 5.0–7.5)

## 2015-01-16 LAB — MICROSCOPIC EXAMINATION
Bacteria, UA: NONE SEEN
Epithelial Cells (non renal): NONE SEEN /hpf (ref 0–10)

## 2015-01-16 NOTE — Progress Notes (Signed)
01/16/2015 2:32 PM   Anthony Gonzales 12-Jun-1931 [id number]  Referring provider: Idelle Crouch, MD [facility] [place], [place] [address]  Chief Complaint  Patient presents with  . Hydronephrosis    new pt     HPI: 1 -Cystitis - pan-sensitive e.coli by [name] 10/17 during admission. for malaise and suspect UTI. Now afebrile and resolved leukocytosis.   2 - Chronic Renal Insufficiency - Cr 1.6-2 2015. [place] 12/2014 with mod rt hydro.   3 - Recurrent Nephrolithiasis - medical passage of stones x several previously. Has not seen Urologist previously. CT 12/2014 stone free.  4 - Moderate Right Hydronephrosis - moderate rt hydro to level of non-distended bladder by [place] and CT 12/2014. No CVAT or flank pain. No stones or obvious mass.  No CV disease or blood thinners.   Today " Anthony Gonzales " is seen in f/u above. Back to baseline following admission for UTI.    PMH: Past Medical History  Diagnosis Date  . Hypertension   . HLD (hyperlipidemia)   . BPH (benign prostatic hyperplasia)   . Glaucoma   . Migraines   . Vertigo   . HTN (hypertension) 12/26/2014  . UTI (lower urinary tract infection) 12/26/2014  . Benign fibroma of prostate 08/23/2013  . Calculus of kidney 08/23/2013  . Hyponatremia 12/26/2014    Surgical History: Past Surgical History  Procedure Laterality Date  . Spermatocelectomy    . Colonoscopy      Home Medications:    Medication List       This list is accurate as of: 01/16/15  2:32 PM.  Always use your most recent med list.               aspirin EC 81 MG tablet  Take 81 mg by mouth.     benazepril-hydrochlorthiazide 20-12.5 MG tablet  Commonly known as:  LOTENSIN HCT  Take 1 tablet by mouth daily.     cefUROXime 250 MG tablet  Commonly known as:  CEFTIN  Take 1 tablet (250 mg total) by mouth 2 (two) times daily with a meal.     Dorzolamide HCl-Timolol Mal PF 22.3-6.8 MG/ML Soln  Apply 1 drop to eye daily.       fenofibrate 160 MG tablet  Take 160 mg by mouth daily.     fenofibrate micronized 134 MG capsule  Commonly known as:  LOFIBRA  Take 134 mg by mouth.     Fish Oil 1200 MG Caps  Take by mouth.     fluticasone 50 MCG/ACT nasal spray  Commonly known as:  FLONASE  PLACE 2 SPRAYS INTO EACH NOSTRIL EVERY DAY     mometasone 50 MCG/ACT nasal spray  Commonly known as:  NASONEX  Place 2 sprays into the nose daily.     multivitamin capsule  Take by mouth.     TGT PSYLLIUM FIBER 0.52 G capsule  Generic drug:  psyllium     timolol 0.25 % ophthalmic solution  Commonly known as:  BETIMOL  Place 1-2 drops into both eyes 2 (two) times daily.        Allergies:  Allergies  Allergen Reactions  . Demerol [Meperidine] Nausea And Vomiting    Other reaction(s): Unknown Other reaction(s): Unknown    Family History: Family History  Problem Relation Age of Onset  . Hypertension Mother   . Hypertension Father   . Prostate cancer Brother     Social History:  reports  that he has never smoked. He does not have any smokeless tobacco history on file. He reports that he does not drink alcohol or use illicit drugs.  ROS: UROLOGY Frequent Urination?: Yes Hard to postpone urination?: No Burning/pain with urination?: No Get up at night to urinate?: Yes Leakage of urine?: No Urine stream starts and stops?: Yes Trouble starting stream?: Yes Do you have to strain to urinate?: Yes Blood in urine?: No Urinary tract infection?: No Sexually transmitted disease?: No Injury to kidneys or bladder?: No Painful intercourse?: No Weak stream?: Yes Erection problems?: Yes Penile pain?: No  Gastrointestinal Nausea?: Yes Vomiting?: No Indigestion/heartburn?: No Diarrhea?: No Constipation?: Yes  Constitutional Fever: No Night sweats?: No Weight loss?: No Fatigue?: No  Skin Skin rash/lesions?: No Itching?: No  Eyes Blurred vision?: Yes Double vision?: No  Ears/Nose/Throat Sore  throat?: No Sinus problems?: Yes  Hematologic/Lymphatic Swollen glands?: No Easy bruising?: Yes  Cardiovascular Leg swelling?: No Chest pain?: No  Respiratory Cough?: Yes Shortness of breath?: No  Endocrine Excessive thirst?: No  Musculoskeletal Back pain?: No Joint pain?: No  Neurological Headaches?: Yes Dizziness?: No  Psychologic Depression?: No Anxiety?: No  Physical Exam: BP 147/74 mmHg  Pulse 89  Ht 6' (1.829 m)  Wt 193 lb (87.544 kg)  BMI 26.17 kg/m2  Constitutional:  Alert and oriented, No acute distress. HEENT: [place] AT, moist mucus membranes.  Trachea midline, no masses. Cardiovascular: No clubbing, cyanosis, or edema. Respiratory: Normal respiratory effort, no increased work of breathing. GI: Abdomen is soft, nontender, nondistended, no abdominal masses GU: No CVA tenderness. Phallus straight. No testes masses. DRE 60gm smoth.  Skin: No rashes, bruises or suspicious lesions. Lymph: No cervical or inguinal adenopathy. Neurologic: Grossly intact, no focal deficits, moving all 4 extremities. Psychiatric: Normal mood and affect.  Laboratory Data: Lab Results  Component Value Date   WBC 5.5 12/30/2014   HGB 12.0* 12/30/2014   HCT 34.9* 12/30/2014   MCV 89.6 12/30/2014   PLT 231 12/30/2014    Lab Results  Component Value Date   CREATININE 1.10 12/30/2014    No results found for: PSA  No results found for: TESTOSTERONE  No results found for: HGBA1C  Urinalysis    Component Value Date/Time   COLORURINE YELLOW* 12/26/2014 2131   COLORURINE [place] 03/22/2013 0938   APPEARANCEUR HAZY* 12/26/2014 2131   APPEARANCEUR Clear 03/22/2013 0938   LABSPEC 1.017 12/26/2014 2131   LABSPEC 1.004 03/22/2013 0938   PHURINE 5.0 12/26/2014 2131   PHURINE 5.0 03/22/2013 [place] 12/26/2014 2131   GLUCOSEU Negative 03/22/2013 0938   HGBUR 3+* 12/26/2014 2131   HGBUR Negative 03/22/2013 [place] 12/26/2014 2131    BILIRUBINUR Negative 03/22/2013 [place] 12/26/2014 2131   KETONESUR Negative 03/22/2013 [place] NEGATIVE 12/26/2014 2131   PROTEINUR Negative 03/22/2013 0938   NITRITE NEGATIVE 12/26/2014 2131   NITRITE Negative 03/22/2013 0938   LEUKOCYTESUR 3+* 12/26/2014 2131   LEUKOCYTESUR Negative 03/22/2013 0938      Assessment & Plan:    1 -Cystitis - UCX today as teest of cure and pre-op before upcoming ureteroscopy.   2 - Chronic Renal Insufficiency - mostly resolved with hydraiton, some component of Rt obstruction possible and plan as per below.   3 - Recurrent Nephrolithiasis - stone free by most recent imaigng.   4 - Moderate Right Hydronephrosis - DDX sricture, bladder or very low ureteral neoplasm, or phsiologic. Rec operative cysto / reterograde /  DX ureteroscopy possible biopsy and stent to further characterize. Risks, benefits, alternatives as well as likely peri-op course and outpatient status discussed.    No Follow-up on file.  Alexis Frock, [facility] [address], [place], [place] [address] [phone]

## 2015-01-17 LAB — URINE CULTURE: ORGANISM ID, BACTERIA: NO GROWTH

## 2015-01-24 ENCOUNTER — Telehealth: Payer: Self-pay | Admitting: Radiology

## 2015-01-24 ENCOUNTER — Encounter
Admission: RE | Admit: 2015-01-24 | Discharge: 2015-01-24 | Disposition: A | Discharge status: Home / Self Care | Payer: PPO | Source: Ambulatory Visit | Attending: Urology | Admitting: Urology

## 2015-01-24 DIAGNOSIS — Z01812 Encounter for preprocedural laboratory examination: Secondary | ICD-10-CM | POA: Diagnosis present

## 2015-01-24 LAB — POTASSIUM: Potassium: 4.1 mmol/L (ref 3.5–5.1)

## 2015-01-24 NOTE — Telephone Encounter (Signed)
Per Dr Doy Hutching instruction, pt was notified to hold ASA '81mg'$  after 01/23/14 dose. Pt voices understanding.

## 2015-01-24 NOTE — Patient Instructions (Signed)
  Your procedure is scheduled on: 01/30/15 Report to Day Surgery. [facility]OND FLOOR To find out your arrival time please call [phone] between 1PM - 3PM on 01/29/15  Remember: Instructions that are not followed completely may result in serious medical risk, up to and including death, or upon the discretion of your surgeon and anesthesiologist your surgery may need to be rescheduled.    __X__ 1. Do not eat food or drink liquids after midnight. No gum chewing or hard candies.     __X__ 2. No Alcohol for 24 hours before or after surgery.   ____ 3. Bring all medications with you on the day of surgery if instructed.    __X__ 4. Notify your doctor if there is any change in your medical condition     (cold, fever, infections).     Do not wear jewelry, make-up, hairpins, clips or nail polish.  Do not wear lotions, powders, or perfumes. You may wear deodorant.  Do not shave 48 hours prior to surgery. Men may shave face and neck.  Do not bring valuables to the hospital.    [facility] is not responsible for any belongings or valuables.               Contacts, dentures or bridgework may not be worn into surgery.  Leave your suitcase in the car. After surgery it may be brought to your room.  For patients admitted to the hospital, discharge time is determined by your                treatment team.   Patients discharged the day of surgery will not be allowed to drive home.   Please read over the following fact sheets that you were given:   Surgical Site Infection Prevention   ____ Take these medicines the morning of surgery with A SIP OF WATER:    1. FENOFIBRATE  2.   3.   4.  5.  6.  ____ Fleet Enema (as directed)   ____ Use CHG Soap as directed  ____ Use inhalers on the day of surgery  ____ Stop metformin 2 days prior to surgery    ____ Take 1/2 of usual insulin dose the night before surgery and none on the morning of surgery.   __X__ Stop Coumadin/Plavix/aspirin on   STOP ASPIRIN NOW ____ Stop Anti-inflammatories on    _X__ Stop supplements until after surgery.  STOP FISH OIL NOW  ____ Bring C-Pap to the hospital.

## 2015-01-24 NOTE — OR Nursing (Signed)
Cleared by Dr Doy Hutching low risk 01/22/15

## 2015-01-30 ENCOUNTER — Ambulatory Visit
Admission: RE | Admit: 2015-01-30 | Discharge: 2015-01-30 | Disposition: A | Discharge status: Home / Self Care | Payer: PPO | Source: Ambulatory Visit | Attending: Urology | Admitting: Urology

## 2015-01-30 ENCOUNTER — Ambulatory Visit: Payer: PPO | Admitting: Anesthesiology

## 2015-01-30 ENCOUNTER — Encounter
Admission: RE | Disposition: A | Discharge status: Home / Self Care | Payer: Self-pay | Source: Ambulatory Visit | Attending: Urology

## 2015-01-30 ENCOUNTER — Encounter: Payer: Self-pay | Admitting: *Deleted

## 2015-01-30 DIAGNOSIS — E871 Hypo-osmolality and hyponatremia: Secondary | ICD-10-CM | POA: Diagnosis not present

## 2015-01-30 DIAGNOSIS — N400 Enlarged prostate without lower urinary tract symptoms: Secondary | ICD-10-CM | POA: Insufficient documentation

## 2015-01-30 DIAGNOSIS — Z8042 Family history of malignant neoplasm of prostate: Secondary | ICD-10-CM | POA: Insufficient documentation

## 2015-01-30 DIAGNOSIS — Z8249 Family history of ischemic heart disease and other diseases of the circulatory system: Secondary | ICD-10-CM | POA: Insufficient documentation

## 2015-01-30 DIAGNOSIS — Z7982 Long term (current) use of aspirin: Secondary | ICD-10-CM | POA: Insufficient documentation

## 2015-01-30 DIAGNOSIS — N3090 Cystitis, unspecified without hematuria: Secondary | ICD-10-CM | POA: Insufficient documentation

## 2015-01-30 DIAGNOSIS — N200 Calculus of kidney: Secondary | ICD-10-CM

## 2015-01-30 DIAGNOSIS — H409 Unspecified glaucoma: Secondary | ICD-10-CM | POA: Insufficient documentation

## 2015-01-30 DIAGNOSIS — I129 Hypertensive chronic kidney disease with stage 1 through stage 4 chronic kidney disease, or unspecified chronic kidney disease: Secondary | ICD-10-CM | POA: Insufficient documentation

## 2015-01-30 DIAGNOSIS — Z79899 Other long term (current) drug therapy: Secondary | ICD-10-CM | POA: Diagnosis not present

## 2015-01-30 DIAGNOSIS — E785 Hyperlipidemia, unspecified: Secondary | ICD-10-CM | POA: Diagnosis not present

## 2015-01-30 DIAGNOSIS — Z87442 Personal history of urinary calculi: Secondary | ICD-10-CM | POA: Diagnosis not present

## 2015-01-30 DIAGNOSIS — N1330 Unspecified hydronephrosis: Secondary | ICD-10-CM | POA: Diagnosis present

## 2015-01-30 DIAGNOSIS — N189 Chronic kidney disease, unspecified: Secondary | ICD-10-CM | POA: Diagnosis not present

## 2015-01-30 DIAGNOSIS — G43909 Migraine, unspecified, not intractable, without status migrainosus: Secondary | ICD-10-CM | POA: Insufficient documentation

## 2015-01-30 DIAGNOSIS — R42 Dizziness and giddiness: Secondary | ICD-10-CM | POA: Diagnosis not present

## 2015-01-30 DIAGNOSIS — N1339 Other hydronephrosis: Secondary | ICD-10-CM | POA: Diagnosis not present

## 2015-01-30 DIAGNOSIS — D4121 Neoplasm of uncertain behavior of right ureter: Secondary | ICD-10-CM | POA: Diagnosis not present

## 2015-01-30 DIAGNOSIS — N289 Disorder of kidney and ureter, unspecified: Secondary | ICD-10-CM | POA: Insufficient documentation

## 2015-01-30 HISTORY — PX: CYSTOSCOPY W/ RETROGRADES: SHX1426

## 2015-01-30 HISTORY — PX: CYSTOSCOPY WITH STENT PLACEMENT: SHX5790

## 2015-01-30 HISTORY — PX: URETEROSCOPY: SHX842

## 2015-01-30 SURGERY — URETEROSCOPY
Anesthesia: General | Laterality: Right | Wound class: Clean Contaminated

## 2015-01-30 MED ORDER — OXYCODONE HCL 5 MG/5ML PO SOLN: 5.0000 mg | Freq: Once | ORAL | Status: DC | PRN

## 2015-01-30 MED ORDER — FENTANYL CITRATE (PF) 100 MCG/2ML IJ SOLN
INTRAMUSCULAR | Status: DC | PRN
  Administered 2015-01-30: 100 ug via INTRAVENOUS

## 2015-01-30 MED ORDER — PROPOFOL 10 MG/ML IV BOLUS
INTRAVENOUS | Status: DC | PRN
  Administered 2015-01-30: 100 mg via INTRAVENOUS

## 2015-01-30 MED ORDER — OXYCODONE HCL 5 MG PO TABS: 5.0000 mg | ORAL_TABLET | Freq: Once | ORAL | Status: DC | PRN

## 2015-01-30 MED ORDER — ONDANSETRON HCL 4 MG/2ML IJ SOLN
INTRAMUSCULAR | Status: DC | PRN
Start: 2015-01-30 — End: 2015-01-30
  Administered 2015-01-30: 4 mg via INTRAVENOUS

## 2015-01-30 MED ORDER — HYDROCODONE-ACETAMINOPHEN 5-325 MG PO TABS: 1.0000 | ORAL_TABLET | Freq: Four times a day (QID) | ORAL | Status: DC | PRN

## 2015-01-30 MED ORDER — IOTHALAMATE MEGLUMINE 43 % IV SOLN
INTRAVENOUS | Status: DC | PRN
  Administered 2015-01-30: 15 mL

## 2015-01-30 MED ORDER — EPHEDRINE SULFATE 50 MG/ML IJ SOLN
INTRAMUSCULAR | Status: DC | PRN
  Administered 2015-01-30: 10 mg via INTRAVENOUS

## 2015-01-30 MED ORDER — HYDROMORPHONE HCL 1 MG/ML IJ SOLN
INTRAMUSCULAR | Status: DC | PRN
  Administered 2015-01-30: 1 mg via INTRAVENOUS

## 2015-01-30 MED ORDER — SODIUM CHLORIDE 0.9 % IV SOLN
INTRAVENOUS | Status: DC
  Administered 2015-01-30: 10:00:00 via INTRAVENOUS

## 2015-01-30 MED ORDER — KETAMINE HCL 50 MG/ML IJ SOLN
INTRAMUSCULAR | Status: DC | PRN
  Administered 2015-01-30: 25 mg via INTRAVENOUS

## 2015-01-30 MED ORDER — LIDOCAINE HCL (CARDIAC) 20 MG/ML IV SOLN
INTRAVENOUS | Status: DC | PRN
  Administered 2015-01-30: 100 mg via INTRAVENOUS

## 2015-01-30 MED ORDER — OXYBUTYNIN CHLORIDE 5 MG PO TABS: 5.0000 mg | ORAL_TABLET | Freq: Three times a day (TID) | ORAL | Status: DC | PRN

## 2015-01-30 MED ORDER — CEFTRIAXONE SODIUM 1 G IJ SOLR
1.0000 g | INTRAMUSCULAR | Status: AC
  Administered 2015-01-30: 1 g via INTRAVENOUS
  Filled 2015-01-30: qty 10

## 2015-01-30 MED ORDER — ACETAMINOPHEN 10 MG/ML IV SOLN
INTRAVENOUS | Status: DC | PRN
  Administered 2015-01-30: 1000 mg via INTRAVENOUS

## 2015-01-30 MED ORDER — TAMSULOSIN HCL 0.4 MG PO CAPS: 0.4000 mg | ORAL_CAPSULE | Freq: Every day | ORAL | Status: DC

## 2015-01-30 MED ORDER — FENTANYL CITRATE (PF) 100 MCG/2ML IJ SOLN: 25.0000 ug | INTRAMUSCULAR | Status: DC | PRN

## 2015-01-30 SURGICAL SUPPLY — 32 items
ADAPTER SCOPE UROLOK II (MISCELLANEOUS) IMPLANT
BAG DRAIN CYSTO-URO LG1000N (MISCELLANEOUS) ×2 IMPLANT
BASKET 3WIRE GEMINI 2.4X120 (MISCELLANEOUS) ×2 IMPLANT
CATH URETL 5X70 OPEN END (CATHETERS) ×2 IMPLANT
CNTNR SPEC 2.5X3XGRAD LEK (MISCELLANEOUS) ×1
CONRAY 43 FOR UROLOGY 50M (MISCELLANEOUS) ×2 IMPLANT
CONT SPEC 4OZ STER OR WHT (MISCELLANEOUS) ×1
CONTAINER SPEC 2.5X3XGRAD LEK (MISCELLANEOUS) ×1 IMPLANT
FORCEPS BIOP PIRANHA Y (CUTTING FORCEPS) ×2 IMPLANT
GLIDEWIRE STIFF .35X180X3 HYDR (WIRE) ×2 IMPLANT
GLOVE BIO SURGEON STRL SZ 6.5 (GLOVE) ×2 IMPLANT
GLOVE BIO SURGEON STRL SZ7 (GLOVE) ×4 IMPLANT
GOWN STRL REUS W/ TWL LRG LVL3 (GOWN DISPOSABLE) ×2 IMPLANT
GOWN STRL REUS W/ TWL LRG LVL4 (GOWN DISPOSABLE) ×2 IMPLANT
GOWN STRL REUS W/TWL LRG LVL3 (GOWN DISPOSABLE) ×2
GOWN STRL REUS W/TWL LRG LVL4 (GOWN DISPOSABLE) ×2
INTRODUCER DILATOR DOUBLE (INTRODUCER) IMPLANT
KIT RM TURNOVER CYSTO AR (KITS) ×2 IMPLANT
PACK CYSTO AR (MISCELLANEOUS) ×2 IMPLANT
PREP PVP WINGED SPONGE (MISCELLANEOUS) ×2 IMPLANT
PUMP SINGLE ACTION SAP (PUMP) IMPLANT
SENSORWIRE 0.038 NOT ANGLED (WIRE) ×4
SET CYSTO W/LG BORE CLAMP LF (SET/KITS/TRAYS/PACK) ×2 IMPLANT
SHEATH URETERAL 12FRX35CM (MISCELLANEOUS) IMPLANT
SOL .9 NS 3000ML IRR  AL (IV SOLUTION) ×1
SOL .9 NS 3000ML IRR UROMATIC (IV SOLUTION) ×1 IMPLANT
STENT URET 6FRX24 CONTOUR (STENTS) IMPLANT
STENT URET 6FRX26 CONTOUR (STENTS) ×2 IMPLANT
SURGILUBE 2OZ TUBE FLIPTOP (MISCELLANEOUS) ×2 IMPLANT
SYRINGE IRR TOOMEY STRL 70CC (SYRINGE) ×2 IMPLANT
WATER STERILE IRR 1000ML POUR (IV SOLUTION) ×2 IMPLANT
WIRE SENSOR 0.038 NOT ANGLED (WIRE) ×2 IMPLANT

## 2015-01-30 NOTE — Interval H&P Note (Signed)
History and Physical Interval Note:  01/30/2015 10:53 AM  Anthony Gonzales  has presented today for surgery, with the diagnosis of RIGHT HYDRONEPHROSIS  The various methods of treatment have been discussed with the patient and family. After consideration of risks, benefits and other options for treatment, the patient has consented to  Procedure(s): URETEROSCOPY/ WITH BIOPSY (Right) CYSTOSCOPY WITH RETROGRADE PYELOGRAM (Right) CYSTOSCOPY WITH STENT PLACEMENT (Right) as a surgical intervention .  The patient's history has been reviewed, patient examined, no change in status, stable for surgery.  I have reviewed the patient's chart and labs.  Questions were answered to the patient's satisfaction.    RRR CTAB  [name]

## 2015-01-30 NOTE — Discharge Instructions (Signed)
You have a ureteral stent in place.  This is a tube that extends from your kidney to your bladder.  This may cause urinary bleeding, burning with urination, and urinary frequency.  Please call our office or present to the ED if you develop fevers >101 or pain which is not able to be controlled with oral pain medications.  You may be given either Flomax and/ or ditropan to help with bladder spasms and stent pain in addition to pain medications.   ° °[facility] °[address], [address] °[place], [place] [address] °[phone] ° ° ° °AMBULATORY SURGERY  °DISCHARGE INSTRUCTIONS ° ° °1) The drugs that you were given will stay in your system until tomorrow so for the next 24 hours you should not: ° °A) Drive an automobile °B) Make any legal decisions °C) Drink any alcoholic beverage ° ° °2) You may resume regular meals tomorrow.  Today it is better to start with liquids and gradually work up to solid foods. ° °You may eat anything you prefer, but it is better to start with liquids, then soup and crackers, and gradually work up to solid foods. ° ° °3) Please notify your doctor immediately if you have any unusual bleeding, trouble breathing, redness and pain at the surgery site, drainage, fever, or pain not relieved by medication. ° ° ° °4) Additional Instructions: ° ° ° ° ° ° ° °Please contact your physician with any problems or Same Day Surgery at [phone], Monday through Friday 6 am to 4 pm, or [place] [facility] [place] [facility] number at [phone]. °

## 2015-01-30 NOTE — Anesthesia Preprocedure Evaluation (Signed)
Anesthesia Evaluation  Patient identified by MRN, date of birth, ID band Patient awake    Reviewed: Allergy & Precautions, H&P , NPO status , Patient's Chart, lab work & pertinent test results  History of Anesthesia Complications Negative for: history of anesthetic complications  Airway Mallampati: III  TM Distance: >3 FB Neck ROM: full    Dental  (+) Poor Dentition, Chipped   Pulmonary neg pulmonary ROS, neg shortness of breath,    Pulmonary exam normal breath sounds clear to auscultation       Cardiovascular Exercise Tolerance: Good hypertension, (-) angina(-) Past MI and (-) DOE Normal cardiovascular exam Rhythm:regular Rate:Normal     Neuro/Psych  Headaches, negative psych ROS   GI/Hepatic negative GI ROS, Neg liver ROS,   Endo/Other  negative endocrine ROS  Renal/GU Renal disease  negative genitourinary   Musculoskeletal   Abdominal   Peds  Hematology negative hematology ROS (+)   Anesthesia Other Findings Past Medical History:   Hypertension                                                 HLD (hyperlipidemia)                                         BPH (benign prostatic hyperplasia)                           Glaucoma                                                       Comment:no drops in 3 mo pressure good   Migraines                                                    Vertigo                                                      HTN (hypertension)                              12/26/2014   UTI (lower urinary tract infection)             12/26/2014   Benign fibroma of prostate                      08/23/2013    Calculus of kidney                              08/23/2013    Hyponatremia  12/26/2014   Cancer ([place])                                                   Comment:skin  Past Surgical History:   SPERMATOCELECTOMY                                             COLONOSCOPY                                                      Reproductive/Obstetrics negative OB ROS                             Anesthesia Physical Anesthesia Plan  ASA: III  Anesthesia Plan: General LMA   Post-op Pain Management:    Induction:   Airway Management Planned:   Additional Equipment:   Intra-op Plan:   Post-operative Plan:   Informed Consent: I have reviewed the patients History and Physical, chart, labs and discussed the procedure including the risks, benefits and alternatives for the proposed anesthesia with the patient or authorized representative who has indicated his/her understanding and acceptance.   Dental Advisory Given  Plan Discussed with: Anesthesiologist, CRNA and Surgeon  Anesthesia Plan Comments:         Anesthesia Quick Evaluation

## 2015-01-30 NOTE — Anesthesia Procedure Notes (Signed)
Procedure Name: LMA Insertion Date/Time: 01/30/2015 11:03 AM Performed by: Rosaria Ferries, Mccoy Testa Pre-anesthesia Checklist: Patient identified, Emergency Drugs available, Suction available and Patient being monitored Patient Re-evaluated:Patient Re-evaluated prior to inductionOxygen Delivery Method: Circle system utilized Preoxygenation: Pre-oxygenation with 100% oxygen Intubation Type: IV induction LMA: LMA inserted LMA Size: 4.0 Number of attempts: 1 Tube secured with: Tape Dental Injury: Teeth and Oropharynx as per pre-operative assessment

## 2015-01-30 NOTE — Anesthesia Postprocedure Evaluation (Signed)
Anesthesia Post Note  Patient: Anthony Gonzales  Procedure(s) Performed: Procedure(s) (LRB): URETEROSCOPY/ WITH BIOPSY AND CYTOLOGY BRUSHING (Right) CYSTOSCOPY WITH RETROGRADE PYELOGRAM (Right) CYSTOSCOPY WITH STENT PLACEMENT (Right)  Patient location during evaluation: PACU Anesthesia Type: General Level of consciousness: awake and alert Pain management: pain level controlled Vital Signs Assessment: post-procedure vital signs reviewed and stable Respiratory status: spontaneous breathing, nonlabored ventilation, respiratory function stable and patient connected to nasal cannula oxygen Cardiovascular status: blood pressure returned to baseline and stable Postop Assessment: No signs of nausea or vomiting Anesthetic complications: no    Last Vitals:  Filed Vitals:   01/30/15 1305 01/30/15 1322  BP: 135/70 142/71  Pulse: 74 84  Temp: 36.7 C 35.6 C  Resp:  18    Last Pain:  Filed Vitals:   01/30/15 1323  PainSc: 0-No pain                 Precious Haws Piscitello

## 2015-01-30 NOTE — Op Note (Signed)
Date of procedure: 01/30/2015  Preoperative diagnosis:  1. Right hydroureteronephrosis     Postoperative diagnosis:        1. Right hydroureteronephrosis        2. Right distal ureteral abnormality  Procedure: 1. Cystoscopy 2. Right ureteroscopy 3. Right distal ureteral biopsy 4. Right brush cytology (ureteral) 5. Right retrograde pyelogram 6. Right ureteral stent placement  Surgeon: [name], MD  Anesthesia: General  Complications: None  Intraoperative findings: Right hydroureteronephrosis down to the level of the UVJ.  Shaggy, irregular appearance of the right distal ureter concerning for malignancy but no obvious pedunculated or papillary tumor.  EBL: Normal  Specimens: Right distal ureteral biopsies, right distal ureteral brush cytology  Drains: 6 x 26 French double-J ureteral stent on right  Indication: Anthony Gonzales is a 81 y.o. patient with recurrent UTIs found to have right moderate hydroureteronephrosis down to the level of the UPJ..  After reviewing the management options for treatment, he elected to proceed with the above surgical procedure(s). We have discussed the potential benefits and risks of the procedure, side effects of the proposed treatment, the likelihood of the patient achieving the goals of the procedure, and any potential problems that might occur during the procedure or recuperation. Informed consent has been obtained.  Description of procedure:  The patient was taken to the operating room and general anesthesia was induced.  The patient was placed in the dorsal lithotomy position, prepped and draped in the usual sterile fashion, and preoperative antibiotics were administered. A preoperative time-out was performed.   A 22 French rigid cystoscope was advanced per urethra into the bladder and the bladder was carefully inspected. Of note, the prostate was enlarged with trilobar coaptation and a median lobe component extending into the bladder  neck.  The trigone was somewhat close to the median lobe but each UO was able to be identified. The bladder mucosa itself was normal without any tumors, masses, ulcerations, or lesions. No bladder stones. The bladder was moderately trabeculated with a large [place] diverticulum on the posterior left lateral wall of the bladder. Attention was then turned to the right UO which was cannulated using a 5 [place] open-ended ureteral catheter at which time a retrograde pyelogram was performed. This revealed a very dilated right collecting system and ureter down to the level of the UVJ worst narrowing was noted.   there were no obvious filling defects along the length of the ureter or within the upper tract collecting system. At this point in time, I attempted to advance a sensor wire followed by an angled Glidewire into the distal ureter, however, due to angulation and narrowing, I was unsuccessful. I then introduced the semirigid ureteroscope just within the distal UO which appeared to be fairly irregular, with concentric narrowing and shaggy appearance of the ureteral mucosa. This was somewhat concerning for an underlying malignancy. At this point, the lumen of the ureter could be identified and a sensor wire was able to be placed up to level of the kidney under direct visualization and under fluoroscopic guidance. The scope was then advanced alongside of the wire up to level of the iliac crest and there were no obvious additional distal ureteral masses identified. The lumen was capacious consistent with a dilated ureter. At this point, several attempts at a distal ureteral biopsy were performed using [place] forceps as well as a Development worker, international aid. Very small bits of tissue were able to be passed off the field as right distal ureteral biopsy.  A biopsy brush was then used to brush the sidewalls of the right distal ureter and this was sent off as a right ureteral brush cytology.   Finally, a second wire was advanced up to  level of the kidney. A flexible ureteroscope was advanced over this wire into the renal pelvis and a formal pyeloscopy was performed. Each and every calyx was directly visualized. The upper system was normal without tumors or masses. Initial retrograde pyelograms were performed of the level of the UPJ which revealed no abnormalities. The scope was then backed down the length of the ureter and there is no obvious tumors or lesions identified other than the abnormality previously described. The safety wire was then backloaded over the rigid cystoscope and a 6 x 26 French double-J ureteral stent was advanced over the wire to the level of renal pelvis. The wire was partially drawn and a coil was noted within the renal pelvis. The wire was then fully withdrawn and the coil was noted within the bladder is well. The bladder was then drained and the scope was removed. The patient was cleaned and dried, repositioned the supine position, reversed from anesthesia, and taken to the PACU in stable condition.   Plan: Patient will follow-up in 1 week to discuss pathology results and possible right ureteral stent removal.  [name], M.D.

## 2015-01-30 NOTE — H&P (View-Only) (Signed)
01/16/2015 2:32 PM   Anthony Gonzales [birth year] [id number]  Referring provider: Idelle Crouch, MD [facility] [place], [place] [address]  Chief Complaint  Patient presents with  . Hydronephrosis    new pt     HPI: 1 -Cystitis - pan-sensitive e.coli by [name] 10/17 during admission. for malaise and suspect UTI. Now afebrile and resolved leukocytosis.   2 - Chronic Renal Insufficiency - Cr 1.6-2 2015. [place] 12/2014 with mod rt hydro.   3 - Recurrent Nephrolithiasis - medical passage of stones x several previously. Has not seen Urologist previously. CT 12/2014 stone free.  4 - Moderate Right Hydronephrosis - moderate rt hydro to level of non-distended bladder by [place] and CT 12/2014. No CVAT or flank pain. No stones or obvious mass.  No CV disease or blood thinners.   Today " Anthony Gonzales " is seen in f/u above. Back to baseline following admission for UTI.    PMH: Past Medical History  Diagnosis Date  . Hypertension   . HLD (hyperlipidemia)   . BPH (benign prostatic hyperplasia)   . Glaucoma   . Migraines   . Vertigo   . HTN (hypertension) 12/26/2014  . UTI (lower urinary tract infection) 12/26/2014  . Benign fibroma of prostate 08/23/2013  . Calculus of kidney 08/23/2013  . Hyponatremia 12/26/2014    Surgical History: Past Surgical History  Procedure Laterality Date  . Spermatocelectomy    . Colonoscopy      Home Medications:    Medication List       This list is accurate as of: 01/16/15  2:32 PM.  Always use your most recent med list.               aspirin EC 81 MG tablet  Take 81 mg by mouth.     benazepril-hydrochlorthiazide 20-12.5 MG tablet  Commonly known as:  LOTENSIN HCT  Take 1 tablet by mouth daily.     cefUROXime 250 MG tablet  Commonly known as:  CEFTIN  Take 1 tablet (250 mg total) by mouth 2 (two) times daily with a meal.     Dorzolamide HCl-Timolol Mal PF 22.3-6.8 MG/ML Soln  Apply 1 drop to eye daily.       fenofibrate 160 MG tablet  Take 160 mg by mouth daily.     fenofibrate micronized 134 MG capsule  Commonly known as:  LOFIBRA  Take 134 mg by mouth.     Fish Oil 1200 MG Caps  Take by mouth.     fluticasone 50 MCG/ACT nasal spray  Commonly known as:  FLONASE  PLACE 2 SPRAYS INTO EACH NOSTRIL EVERY DAY     mometasone 50 MCG/ACT nasal spray  Commonly known as:  NASONEX  Place 2 sprays into the nose daily.     multivitamin capsule  Take by mouth.     TGT PSYLLIUM FIBER 0.52 G capsule  Generic drug:  psyllium     timolol 0.25 % ophthalmic solution  Commonly known as:  BETIMOL  Place 1-2 drops into both eyes 2 (two) times daily.        Allergies:  Allergies  Allergen Reactions  . Demerol [Meperidine] Nausea And Vomiting    Other reaction(s): Unknown Other reaction(s): Unknown    Family History: Family History  Problem Relation Age of Onset  . Hypertension Mother   . Hypertension Father   . Prostate cancer Brother     Social History:  reports  that he has never smoked. He does not have any smokeless tobacco history on file. He reports that he does not drink alcohol or use illicit drugs.  ROS: UROLOGY Frequent Urination?: Yes Hard to postpone urination?: No Burning/pain with urination?: No Get up at night to urinate?: Yes Leakage of urine?: No Urine stream starts and stops?: Yes Trouble starting stream?: Yes Do you have to strain to urinate?: Yes Blood in urine?: No Urinary tract infection?: No Sexually transmitted disease?: No Injury to kidneys or bladder?: No Painful intercourse?: No Weak stream?: Yes Erection problems?: Yes Penile pain?: No  Gastrointestinal Nausea?: Yes Vomiting?: No Indigestion/heartburn?: No Diarrhea?: No Constipation?: Yes  Constitutional Fever: No Night sweats?: No Weight loss?: No Fatigue?: No  Skin Skin rash/lesions?: No Itching?: No  Eyes Blurred vision?: Yes Double vision?: No  Ears/Nose/Throat Sore  throat?: No Sinus problems?: Yes  Hematologic/Lymphatic Swollen glands?: No Easy bruising?: Yes  Cardiovascular Leg swelling?: No Chest pain?: No  Respiratory Cough?: Yes Shortness of breath?: No  Endocrine Excessive thirst?: No  Musculoskeletal Back pain?: No Joint pain?: No  Neurological Headaches?: Yes Dizziness?: No  Psychologic Depression?: No Anxiety?: No  Physical Exam: BP 147/74 mmHg  Pulse 89  Ht 6' (1.829 m)  Wt 193 lb (87.544 kg)  BMI 26.17 kg/m2  Constitutional:  Alert and oriented, No acute distress. HEENT: [place] AT, moist mucus membranes.  Trachea midline, no masses. Cardiovascular: No clubbing, cyanosis, or edema. Respiratory: Normal respiratory effort, no increased work of breathing. GI: Abdomen is soft, nontender, nondistended, no abdominal masses GU: No CVA tenderness. Phallus straight. No testes masses. DRE 60gm smoth.  Skin: No rashes, bruises or suspicious lesions. Lymph: No cervical or inguinal adenopathy. Neurologic: Grossly intact, no focal deficits, moving all 4 extremities. Psychiatric: Normal mood and affect.  Laboratory Data: Lab Results  Component Value Date   WBC 5.5 12/30/2014   HGB 12.0* 12/30/2014   HCT 34.9* 12/30/2014   MCV 89.6 12/30/2014   PLT 231 12/30/2014    Lab Results  Component Value Date   CREATININE 1.10 12/30/2014    No results found for: PSA  No results found for: TESTOSTERONE  No results found for: HGBA1C  Urinalysis    Component Value Date/Time   COLORURINE YELLOW* 12/26/2014 2131   COLORURINE Straw 03/22/2013 0938   APPEARANCEUR HAZY* 12/26/2014 2131   APPEARANCEUR Clear 03/22/2013 0938   LABSPEC 1.017 12/26/2014 2131   LABSPEC 1.004 03/22/2013 0938   PHURINE 5.0 12/26/2014 2131   PHURINE 5.0 03/22/2013 [place] 12/26/2014 2131   GLUCOSEU Negative 03/22/2013 0938   HGBUR 3+* 12/26/2014 2131   HGBUR Negative 03/22/2013 [place] 12/26/2014 2131    BILIRUBINUR Negative 03/22/2013 [place] 12/26/2014 2131   KETONESUR Negative 03/22/2013 [place] NEGATIVE 12/26/2014 2131   PROTEINUR Negative 03/22/2013 0938   NITRITE NEGATIVE 12/26/2014 2131   NITRITE Negative 03/22/2013 0938   LEUKOCYTESUR 3+* 12/26/2014 2131   LEUKOCYTESUR Negative 03/22/2013 0938      Assessment & Plan:    1 -Cystitis - UCX today as teest of cure and pre-op before upcoming ureteroscopy.   2 - Chronic Renal Insufficiency - mostly resolved with hydraiton, some component of Rt obstruction possible and plan as per below.   3 - Recurrent Nephrolithiasis - stone free by most recent imaigng.   4 - Moderate Right Hydronephrosis - DDX sricture, bladder or very low ureteral neoplasm, or phsiologic. Rec operative cysto / reterograde /  DX ureteroscopy possible biopsy and stent to further characterize. Risks, benefits, alternatives as well as likely peri-op course and outpatient status discussed.    No Follow-up on file.  Alexis Frock, [facility] [address] [place], [place] [address] [phone]

## 2015-01-30 NOTE — Transfer of Care (Signed)
Immediate Anesthesia Transfer of Care Note  Patient: Anthony Gonzales  Procedure(s) Performed: Procedure(s): URETEROSCOPY/ WITH BIOPSY AND CYTOLOGY BRUSHING (Right) CYSTOSCOPY WITH RETROGRADE PYELOGRAM (Right) CYSTOSCOPY WITH STENT PLACEMENT (Right)  Patient Location: PACU  Anesthesia Type:General  Level of Consciousness: awake and patient cooperative  Airway & Oxygen Therapy: Patient Spontanous Breathing and Patient connected to face mask oxygen  Post-op Assessment: Report given to RN and Post -op Vital signs reviewed and stable  Post vital signs: Reviewed and stable  Last Vitals:  Filed Vitals:   01/30/15 0954 01/30/15 1220  BP: 154/91 144/80  Pulse: 80 88  Temp: 36.6 C 36.6 C  Resp: 16 14    Complications: No apparent anesthesia complications

## 2015-01-31 ENCOUNTER — Encounter: Payer: Self-pay | Admitting: Urology

## 2015-02-04 ENCOUNTER — Emergency Department
Admission: EM | Admit: 2015-02-04 | Discharge: 2015-02-04 | Disposition: A | Discharge status: Home / Self Care | Payer: PPO | Attending: Emergency Medicine | Admitting: Emergency Medicine

## 2015-02-04 ENCOUNTER — Encounter: Payer: Self-pay | Admitting: Emergency Medicine

## 2015-02-04 DIAGNOSIS — R55 Syncope and collapse: Secondary | ICD-10-CM | POA: Diagnosis present

## 2015-02-04 DIAGNOSIS — Z7982 Long term (current) use of aspirin: Secondary | ICD-10-CM | POA: Diagnosis not present

## 2015-02-04 DIAGNOSIS — N183 Chronic kidney disease, stage 3 (moderate): Secondary | ICD-10-CM | POA: Diagnosis not present

## 2015-02-04 DIAGNOSIS — Z79899 Other long term (current) drug therapy: Secondary | ICD-10-CM | POA: Insufficient documentation

## 2015-02-04 DIAGNOSIS — I129 Hypertensive chronic kidney disease with stage 1 through stage 4 chronic kidney disease, or unspecified chronic kidney disease: Secondary | ICD-10-CM | POA: Diagnosis not present

## 2015-02-04 DIAGNOSIS — Z7951 Long term (current) use of inhaled steroids: Secondary | ICD-10-CM | POA: Diagnosis not present

## 2015-02-04 LAB — URINALYSIS COMPLETE WITH MICROSCOPIC (ARMC ONLY)
BACTERIA UA: NONE SEEN
Bilirubin Urine: NEGATIVE
Glucose, UA: NEGATIVE mg/dL
Ketones, ur: NEGATIVE mg/dL
Nitrite: NEGATIVE
PROTEIN: 100 mg/dL — AB
Specific Gravity, Urine: 1.01 (ref 1.005–1.030)
pH: 7 (ref 5.0–8.0)

## 2015-02-04 LAB — COMPREHENSIVE METABOLIC PANEL
ALBUMIN: 3.7 g/dL (ref 3.5–5.0)
ALT: 21 U/L (ref 17–63)
ANION GAP: 7 (ref 5–15)
AST: 28 U/L (ref 15–41)
Alkaline Phosphatase: 42 U/L (ref 38–126)
BUN: 20 mg/dL (ref 6–20)
CO2: 30 mmol/L (ref 22–32)
Calcium: 9 mg/dL (ref 8.9–10.3)
Chloride: 97 mmol/L — ABNORMAL LOW (ref 101–111)
Creatinine, Ser: 1.45 mg/dL — ABNORMAL HIGH (ref 0.61–1.24)
GFR calc Af Amer: 51 mL/min — ABNORMAL LOW (ref >60)
GFR calc non Af Amer: 44 mL/min — ABNORMAL LOW (ref >60)
GLUCOSE: 100 mg/dL — AB (ref 65–99)
POTASSIUM: 4.3 mmol/L (ref 3.5–5.1)
SODIUM: 134 mmol/L — AB (ref 135–145)
TOTAL PROTEIN: 6.4 g/dL — AB (ref 6.5–8.1)
Total Bilirubin: 0.4 mg/dL (ref 0.3–1.2)

## 2015-02-04 LAB — CBC WITH DIFFERENTIAL/PLATELET
BASOS PCT: 1 %
Basophils Absolute: 0 10*3/uL (ref 0–0.1)
EOS ABS: 0.1 10*3/uL (ref 0–0.7)
Eosinophils Relative: 1 %
HCT: 39.1 % — ABNORMAL LOW (ref 40.0–52.0)
Hemoglobin: 13 g/dL (ref 13.0–18.0)
Lymphocytes Relative: 11 %
Lymphs Abs: 0.8 10*3/uL — ABNORMAL LOW (ref 1.0–3.6)
MCH: 29.3 pg (ref 26.0–34.0)
MCHC: 33.2 g/dL (ref 32.0–36.0)
MCV: 88.4 fL (ref 80.0–100.0)
MONO ABS: 0.6 10*3/uL (ref 0.2–1.0)
MONOS PCT: 8 %
Neutro Abs: 5.6 10*3/uL (ref 1.4–6.5)
Neutrophils Relative %: 79 %
PLATELETS: 235 10*3/uL (ref 150–440)
RBC: 4.42 MIL/uL (ref 4.40–5.90)
RDW: 13.3 % (ref 11.5–14.5)
WBC: 7 10*3/uL (ref 3.8–10.6)

## 2015-02-04 LAB — TROPONIN I: Troponin I: <0.03 ng/mL (ref <0.031)

## 2015-02-04 NOTE — Discharge Instructions (Signed)
Please do not take your blood pressure medication and bladder medication at the same time. It is important to space them out so your blood pressure does not drop low again. Please seek medical attention for any high fevers, chest pain, shortness of breath, change in behavior, persistent vomiting, bloody stool or any other new or concerning symptoms.   Syncope, commonly known as fainting, is a temporary loss of consciousness. It occurs when the blood flow to the brain is reduced. Vasovagal syncope (also called neurocardiogenic syncope) is a fainting spell in which the blood flow to the brain is reduced because of a sudden drop in heart rate and blood pressure. Vasovagal syncope occurs when the brain and the cardiovascular system (blood vessels) do not adequately communicate and respond to each other. This is the most common cause of fainting. It often occurs in response to fear or some other type of emotional or physical stress. The body has a reaction in which the heart starts beating too slowly or the blood vessels expand, reducing blood pressure. This type of fainting spell is generally considered harmless. However, injuries can occur if a person takes a sudden fall during a fainting spell.  CAUSES  Vasovagal syncope occurs when a person's blood pressure and heart rate decrease suddenly, usually in response to a trigger. Many things and situations can trigger an episode. Some of these include:   Pain.   Fear.   The sight of blood or medical procedures, such as blood being drawn from a vein.   Common activities, such as coughing, swallowing, stretching, or going to the bathroom.   Emotional stress.   Prolonged standing, especially in a warm environment.   Lack of sleep or rest.   Prolonged lack of food.   Prolonged lack of fluids.   Recent illness.  The use of certain drugs that affect blood pressure, such as cocaine, alcohol, marijuana, inhalants, and opiates.  SYMPTOMS    Before the fainting episode, you may:   Feel dizzy or light headed.   Become pale.  Sense that you are going to faint.   Feel like the room is spinning.   Have tunnel vision, only seeing directly in front of you.   Feel sick to your stomach (nauseous).   See spots or slowly lose vision.   Hear ringing in your ears.   Have a headache.   Feel warm and sweaty.   Feel a sensation of pins and needles. During the fainting spell, you will generally be unconscious for no longer than a couple minutes before waking up and returning to normal. If you get up too quickly before your body can recover, you may faint again. Some twitching or jerky movements may occur during the fainting spell.  DIAGNOSIS  Your health care provider will ask about your symptoms, take a medical history, and perform a physical exam. Various tests may be done to rule out other causes of fainting. These may include blood tests and tests to check the heart, such as electrocardiography, echocardiography, and possibly an electrophysiology study. When other causes have been ruled out, a test may be done to check the body's response to changes in position (tilt table test). TREATMENT  Most cases of vasovagal syncope do not require treatment. Your health care provider may recommend ways to avoid fainting triggers and may provide home strategies for preventing fainting. If you must be exposed to a possible trigger, you can drink additional fluids to help reduce your chances of having an episode  of vasovagal syncope. If you have warning signs of an oncoming episode, you can respond by positioning yourself favorably (lying down). If your fainting spells continue, you may be given medicines to prevent fainting. Some medicines may help make you more resistant to repeated episodes of vasovagal syncope. Special exercises or compression stockings may be recommended. In rare cases, the surgical placement of a pacemaker is  considered. HOME CARE INSTRUCTIONS   Learn to identify the warning signs of vasovagal syncope.   Sit or lie down at the first warning sign of a fainting spell. If sitting, put your head down between your legs. If you lie down, swing your legs up in the air to increase blood flow to the brain.   Avoid hot tubs and saunas.  Avoid prolonged standing.  Drink enough fluids to keep your urine clear or pale yellow. Avoid caffeine.  Increase salt in your diet as directed by your health care provider.   If you have to stand for a long time, perform movements such as:   Crossing your legs.   Flexing and stretching your leg muscles.   Squatting.   Moving your legs.   Bending over.   Only take over-the-counter or prescription medicines as directed by your health care provider. Do not suddenly stop any medicines without asking your health care provider first. [place] IF:   Your fainting spells continue or happen more frequently in spite of treatment.   You lose consciousness for more than a couple minutes.  You have fainting spells during or after exercising or after being startled.   You have new symptoms that occur with the fainting spells, such as:   Shortness of breath.  Chest pain.   Irregular heartbeat.   You have episodes of twitching or jerky movements that last longer than a few seconds.  You have episodes of twitching or jerky movements without obvious fainting. SEEK IMMEDIATE MEDICAL CARE IF:   You have injuries or bleeding after a fainting spell.   You have episodes of twitching or jerky movements that last longer than 5 minutes.   You have more than one spell of twitching or jerky movements before returning to consciousness after fainting.   This information is not intended to replace advice given to you by your health care provider. Make sure you discuss any questions you have with your health care provider.   Document Released:  02/11/2012 Document Revised: 07/11/2014 Document Reviewed: 02/11/2012 Elsevier Interactive Patient Education [employer].

## 2015-02-04 NOTE — ED Notes (Signed)
[facility] after suffering syncopal episode. Pt denies pain except for "when I try to make water." Pt states he was pt here for 2-3 weeks ago for UTI and he had kidney biopsy Tuesday and had a stent placed in his urinary tract. Pt states the pain is in his penis.

## 2015-02-04 NOTE — ED Provider Notes (Signed)
[facility] Emergency Department Provider Note    ____________________________________________  Time seen: 1055  I have reviewed the triage vital signs and the nursing notes.   HISTORY  Chief Complaint Loss of Consciousness   History limited by: Not Limited   HPI Anthony Gonzales is a 81 y.o. male who presents to the emergency department today after a syncopal episode. Patient states he was at church when he started feeling weird. He states that he felt like he was given a pass out. He then drank a little and started feeling little bit better. He then felt worse. Family states he started to turn pale. He got up walked about 50 feet and then had a syncopal episode. Family was near him so were able to catch him so he did not fall. He denied any concurrent chest pain or shortness of breath. No palpitations. Denies syncopal episodes in the past. States he ate and drank his normal amount this point. He did state however that this morning he took a lot of his medications at one time including both blood pressure medication and Flomax. He states he does not normally do this.   Past Medical History  Diagnosis Date  . Hypertension   . HLD (hyperlipidemia)   . BPH (benign prostatic hyperplasia)   . Glaucoma     no drops in 3 mo pressure good  . Migraines   . Vertigo   . HTN (hypertension) 12/26/2014  . UTI (lower urinary tract infection) 12/26/2014  . Benign fibroma of prostate 08/23/2013  . Calculus of kidney 08/23/2013  . Hyponatremia 12/26/2014  . Cancer [facility])     skin    Patient Active Problem List   Diagnosis Date Noted  . Abdominal pain   . Emesis   . UTI (lower urinary tract infection) 12/26/2014  . Hyponatremia 12/26/2014  . HTN (hypertension) 12/26/2014  . HLD (hyperlipidemia) 12/26/2014  . BPH (benign prostatic hyperplasia) 12/26/2014  . Glaucoma 12/26/2014  . CKD (chronic kidney disease), stage III 12/26/2014  . H/O adenomatous polyp of  colon 10/07/2013  . Benign fibroma of prostate 08/23/2013  . Allergy to environmental factors 08/23/2013  . History of migraine headaches 08/23/2013  . Calculus of kidney 08/23/2013  . Head revolving around 08/23/2013    Past Surgical History  Procedure Laterality Date  . Spermatocelectomy    . Colonoscopy    . Ureteroscopy Right 01/30/2015    Procedure: URETEROSCOPY/ WITH BIOPSY AND CYTOLOGY BRUSHING;  Surgeon: [name], MD;  Location: [facility] ORS;  Service: Urology;  Laterality: Right;  . Cystoscopy w/ retrogrades Right 01/30/2015    Procedure: CYSTOSCOPY WITH RETROGRADE PYELOGRAM;  Surgeon: [name], MD;  Location: [facility] ORS;  Service: Urology;  Laterality: Right;  . Cystoscopy with stent placement Right 01/30/2015    Procedure: CYSTOSCOPY WITH STENT PLACEMENT;  Surgeon: [name], MD;  Location: [facility] ORS;  Service: Urology;  Laterality: Right;    Current Outpatient Rx  Name  Route  Sig  Dispense  Refill  . aspirin EC 81 MG tablet   Oral   Take 81 mg by mouth.         . benazepril-hydrochlorthiazide (LOTENSIN HCT) 20-12.5 MG tablet   Oral   Take 1 tablet by mouth daily.         . fenofibrate micronized (LOFIBRA) 134 MG capsule   Oral   Take 134 mg by mouth.         . fluticasone (FLONASE) 50 MCG/ACT nasal spray  PLACE 2 SPRAYS INTO EACH NOSTRIL EVERY DAY         . HYDROcodone-acetaminophen (NORCO/VICODIN) 5-325 MG tablet   Oral   Take 1-2 tablets by mouth every 6 (six) hours as needed for moderate pain.   10 tablet   0   . Multiple Vitamin (MULTIVITAMIN) capsule   Oral   Take by mouth.         . Omega-3 Fatty Acids (FISH OIL) 1200 MG CAPS   Oral   Take by mouth.         . oxybutynin (DITROPAN) 5 MG tablet   Oral   Take 1 tablet (5 mg total) by mouth every 8 (eight) hours as needed for bladder spasms.   30 tablet   0   . psyllium (TGT PSYLLIUM FIBER) 0.52 G capsule               . tamsulosin (FLOMAX) 0.4 MG CAPS capsule    Oral   Take 1 capsule (0.4 mg total) by mouth daily.   30 capsule   0     Allergies Demerol and Lipitor  Family History  Problem Relation Age of Onset  . Hypertension Mother   . Hypertension Father   . Prostate cancer Brother     Social History Social History  Substance Use Topics  . Smoking status: Never Smoker   . Smokeless tobacco: Never Used  . Alcohol Use: No    Review of Systems  Constitutional: Negative for fever. Cardiovascular: Negative for chest pain. Respiratory: Negative for shortness of breath. Gastrointestinal: Negative for abdominal pain, vomiting and diarrhea. Genitourinary: Negative for dysuria. Musculoskeletal: Negative for back pain. Skin: Negative for rash. Neurological: Negative for headaches, focal weakness or numbness.  10-point ROS otherwise negative.  ____________________________________________   PHYSICAL EXAM:  VITAL SIGNS: ED Triage Vitals  Enc Vitals Group     BP 02/04/15 1017 131/76 mmHg     Pulse Rate 02/04/15 1017 74     Resp 02/04/15 1017 17     Temp 02/04/15 1017 97.9 F (36.6 C)     Temp Source 02/04/15 1017 Oral     SpO2 02/04/15 1017 96 %     Weight 02/04/15 1017 196 lb (88.905 kg)     Height 02/04/15 1017 '5\' 11"'$  (1.803 m)     Head Cir --      Peak Flow --      Pain Score 02/04/15 1018 5   Constitutional: Alert and oriented. Well appearing and in no distress. Eyes: Conjunctivae are normal. PERRL. Normal extraocular movements. ENT   Head: Normocephalic and atraumatic.   Nose: No congestion/rhinnorhea.   Mouth/Throat: Mucous membranes are moist.   Neck: No stridor. Hematological/Lymphatic/Immunilogical: No cervical lymphadenopathy. Cardiovascular: Normal rate, regular rhythm.  No murmurs, rubs, or gallops. Respiratory: Normal respiratory effort without tachypnea nor retractions. Breath sounds are clear and equal bilaterally. No wheezes/rales/rhonchi. Gastrointestinal: Soft and nontender. No distention.  There is no CVA tenderness. Genitourinary: Deferred Musculoskeletal: Normal range of motion in all extremities. No joint effusions.  No lower extremity tenderness nor edema. Neurologic:  Normal speech and language. No gross focal neurologic deficits are appreciated.  Skin:  Skin is warm, dry and intact. No rash noted. Psychiatric: Mood and affect are normal. Speech and behavior are normal. Patient exhibits appropriate insight and judgment.  ____________________________________________    LABS (pertinent positives/negatives)  Trop <0.03 Cr 1.45 UA with many RBCs ____________________________________________   EKG  I, Nance Pear, attending physician, personally viewed and  interpreted this EKG  EKG Time: 1017 Rate: 73 Rhythm: NSR Axis: normal Intervals: qtc 418 QRS: narrow, abnormal r wave progression ST changes: no st elevation Impression: abnormal ekg ____________________________________________    RADIOLOGY  None   ____________________________________________   PROCEDURES  Procedure(s) performed: None  Critical Care performed: No  ____________________________________________   INITIAL IMPRESSION / ASSESSMENT AND PLAN / ED COURSE  Pertinent labs & imaging results that were available during my care of the patient were reviewed by me and considered in my medical decision making (see chart for details).  Patient presented to the emergency department today after a syncopal episode. Patient does have a good story for vasovagal and orthostatic hypotension given history of taking his blood pressure and Flomax medications at the same time. Patient did not have any chest pain during this episode. First troponin was negative. I will however some check a second troponin out an abundance of caution. Furthermore patient's urine does show many red blood cells of her patient did recently have a procedure done on his urinary tract. This point patient without any concerning  symptoms for urinary tract infection. No fevers. No leukocytosis.  Second troponin pending at time of sign out. Will prepare paperwork in the anticipation that the second troponin is negative.  ____________________________________________   FINAL CLINICAL IMPRESSION(S) / ED DIAGNOSES  Final diagnoses:  Syncope and collapse     Nance Pear, MD 02/05/15 1440

## 2015-02-04 NOTE — ED Notes (Signed)
Pt discharged home after verbalizing understanding of discharge instructions; nad noted. 

## 2015-02-07 ENCOUNTER — Ambulatory Visit (INDEPENDENT_AMBULATORY_CARE_PROVIDER_SITE_OTHER): Payer: PPO

## 2015-02-07 DIAGNOSIS — R339 Retention of urine, unspecified: Secondary | ICD-10-CM | POA: Diagnosis not present

## 2015-02-07 LAB — BLADDER SCAN AMB NON-IMAGING: Scan Result: 438

## 2015-02-07 LAB — SURGICAL PATHOLOGY

## 2015-02-07 LAB — CYTOLOGY - NON PAP

## 2015-02-07 NOTE — Progress Notes (Signed)
Continuous Intermittent Catheterization  Due to urinary rentention patient is present today for a teaching of self I & O Catheterization. Patient was given detailed verbal and printed instructions of self catheterization. Patient was cleaned and prepped in a sterile fashion.  With instruction and assistance patient inserted a 14FR and urine return was noted 400 ml, urine was clear and yellow in color. Patient tolerated well, no complications were noted Patient was given a sample bag with supplies to take home.  Instructions were given per Anthony Gonzales for patient to cath bid times daily.  An order was placed with [employer] for catheters to be sent to the patient's home. Patient is to follow up with [name].   Preformed by: Anthony Fail, LPN  Additional Notes: pt called this morning stating he has only been able to dribble for the past 23-24hrs up urination. Pt was brought in for a PVR that was 438. Pt elected to learn CIC and will continue this for 3 months.

## 2015-02-13 ENCOUNTER — Ambulatory Visit (INDEPENDENT_AMBULATORY_CARE_PROVIDER_SITE_OTHER): Payer: PPO | Admitting: Urology

## 2015-02-13 VITALS — BP 129/79 | HR 93 | Wt 189.1 lb

## 2015-02-13 DIAGNOSIS — N2889 Other specified disorders of kidney and ureter: Secondary | ICD-10-CM | POA: Diagnosis not present

## 2015-02-13 DIAGNOSIS — Z87442 Personal history of urinary calculi: Secondary | ICD-10-CM

## 2015-02-13 DIAGNOSIS — R339 Retention of urine, unspecified: Secondary | ICD-10-CM

## 2015-02-13 DIAGNOSIS — N1330 Unspecified hydronephrosis: Secondary | ICD-10-CM

## 2015-02-13 DIAGNOSIS — N289 Disorder of kidney and ureter, unspecified: Secondary | ICD-10-CM

## 2015-02-13 LAB — URINALYSIS, COMPLETE
BILIRUBIN UA: NEGATIVE
GLUCOSE, UA: NEGATIVE
Ketones, UA: NEGATIVE
Nitrite, UA: NEGATIVE
SPEC GRAV UA: 1.01 (ref 1.005–1.030)
UUROB: 0.2 mg/dL (ref 0.2–1.0)
pH, UA: 6 (ref 5.0–7.5)

## 2015-02-13 LAB — MICROSCOPIC EXAMINATION
Bacteria, UA: NONE SEEN
RBC, UA: >30 /hpf — ABNORMAL HIGH (ref 0–?)

## 2015-02-13 MED ORDER — CIPROFLOXACIN HCL 500 MG PO TABS: 500.0000 mg | ORAL_TABLET | Freq: Once | ORAL | Status: DC

## 2015-02-13 MED ORDER — LIDOCAINE HCL 2 % EX GEL
1.0000 "application " | Freq: Once | CUTANEOUS | Status: AC
  Administered 2015-02-13: 1 via URETHRAL

## 2015-02-13 NOTE — Progress Notes (Signed)
4:44 PM  02/13/2015   Anthony Gonzales [birth year] [id number]  Referring provider: Idelle Crouch, MD [place] [facility] [name], [name] [address]  Chief Complaint  Patient presents with  . Cysto Stent Removal    HPI:  81 year old male who returns today for cystoscopy, stent removal following right diagnostic ureteroscopy for workup of incidental moderate right hydronephrosis identified on ultrasound and CT scan in 10 2016. He was asymptomatic at the time of discovery.   He is taken to the OR on 01/30/2015 at which time right ureteroscopy revealed as suspicious area within the right distal ureter with a shaggy, irregular appearance but no obvious papillary or pedunculated tumor. Brush cytology was negative for any malignancy.   Ureteral biopsy revealed some atypia favoring neoplasm but essentially was non-diagnostic.   Unfortunately, since surgery he's had a few complications including a syncopal episode presumably related to his blood pressure medicines in addition to Flomax. He also developed urinary retention and is been straight cathetering since. He brings with him today a diary which indicates that he has been voiding with postvoid residuals ranging anywhere from 80-200 cc. He is quite bothered by urinary urgency and frequency.   He does have baseline chronic renal insufficiency.  Cr 1.6-2 2015.   He also has a history of recurrent nephrolithiasis although no stone seen on CT scan as of 12/2014.    PMH: Past Medical History  Diagnosis Date  . Hypertension   . HLD (hyperlipidemia)   . BPH (benign prostatic hyperplasia)   . Glaucoma     no drops in 3 mo pressure good  . Migraines   . Vertigo   . HTN (hypertension) 12/26/2014  . UTI (lower urinary tract infection) 12/26/2014  . Benign fibroma of prostate 08/23/2013  . Calculus of kidney 08/23/2013  . Hyponatremia 12/26/2014  . Cancer [facility])     skin    Surgical History: Past Surgical  History  Procedure Laterality Date  . Spermatocelectomy    . Colonoscopy    . Ureteroscopy Right 01/30/2015    Procedure: URETEROSCOPY/ WITH BIOPSY AND CYTOLOGY BRUSHING;  Surgeon: [name], MD;  Location: [facility] ORS;  Service: Urology;  Laterality: Right;  . Cystoscopy w/ retrogrades Right 01/30/2015    Procedure: CYSTOSCOPY WITH RETROGRADE PYELOGRAM;  Surgeon: [name], MD;  Location: [facility] ORS;  Service: Urology;  Laterality: Right;  . Cystoscopy with stent placement Right 01/30/2015    Procedure: CYSTOSCOPY WITH STENT PLACEMENT;  Surgeon: [name], MD;  Location: [facility] ORS;  Service: Urology;  Laterality: Right;    Home Medications:    Medication List       This list is accurate as of: 02/13/15  4:44 PM.  Always use your most recent med list.               aspirin EC 81 MG tablet  Take 81 mg by mouth.     benazepril-hydrochlorthiazide 20-12.5 MG tablet  Commonly known as:  LOTENSIN HCT  Take 1 tablet by mouth daily.     cefUROXime 250 MG tablet  Commonly known as:  CEFTIN  Take 250 mg by mouth 2 (two) times daily.     fenofibrate micronized 134 MG capsule  Commonly known as:  LOFIBRA  Take 134 mg by mouth.     Fish Oil 1200 MG Caps  Take by mouth.     fluticasone 50 MCG/ACT nasal spray  Commonly known as:  FLONASE  PLACE 2 SPRAYS  INTO EACH NOSTRIL EVERY DAY     multivitamin capsule  Take by mouth.     TGT PSYLLIUM FIBER 0.52 G capsule  Generic drug:  psyllium        Allergies:  Allergies  Allergen Reactions  . Demerol [Meperidine] Nausea And Vomiting    Other reaction(s): Unknown Other reaction(s): Unknown  . Lipitor [Atorvastatin] Swelling    Family History: Family History  Problem Relation Age of Onset  . Hypertension Mother   . Hypertension Father   . Prostate cancer Brother     Social History:  reports that he has never smoked. He has never used smokeless tobacco. He reports that he does not drink alcohol or use illicit  drugs.   Physical Exam: BP 129/79 mmHg  Pulse 93  Wt 189 lb 1.6 oz (85.775 kg)  Constitutional:  Alert and oriented, No acute distress. HEENT: [place] AT, moist mucus membranes.  Trachea midline, no masses. Cardiovascular: No clubbing, cyanosis, or edema. Respiratory: Normal respiratory effort, no increased work of breathing. GI: Abdomen is soft, nontender, nondistended, no abdominal masses GU: No CVA tenderness. Normal urethral meatus. Skin: No rashes, bruises or suspicious lesions. Neurologic: Grossly intact, no focal deficits, moving all 4 extremities. Psychiatric: Normal mood and affect.  Laboratory Data: Lab Results  Component Value Date   WBC 7.0 02/04/2015   HGB 13.0 02/04/2015   HCT 39.1* 02/04/2015   MCV 88.4 02/04/2015   PLT 235 02/04/2015    Lab Results  Component Value Date   CREATININE 1.45* 02/04/2015     Urinalysis Results for orders placed or performed in visit on 02/13/15  Microscopic Examination  Result Value Ref Range   WBC, UA 6-10 (A) 0 -  5 /hpf   RBC, UA >30 (H) 0 -  2 /hpf   Epithelial Cells (non renal) 0-10 0 - 10 /hpf   Mucus, UA Present (A) Not Estab.   Bacteria, UA None seen None seen/Few  Urinalysis, Complete  Result Value Ref Range   Specific Gravity, UA 1.010 1.005 - 1.030   pH, UA 6.0 5.0 - 7.5   Color, UA Yellow Yellow   Appearance Ur Clear Clear   Leukocytes, UA 1+ (A) Negative   Protein, UA Trace (A) Negative/Trace   Glucose, UA Negative Negative   Ketones, UA Negative Negative   RBC, UA 3+ (A) Negative   Bilirubin, UA Negative Negative   Urobilinogen, Ur 0.2 0.2 - 1.0 mg/dL   Nitrite, UA Negative Negative   Microscopic Examination See below:     Cystoscopy/ Stent removal procedure  Patient identification was confirmed, informed consent was obtained, and patient was prepped using Betadine solution.  Lidocaine jelly was administered per urethral meatus.    Preoperative abx where received prior to procedure.    Procedure: -  Flexible cystoscope introduced, without any difficulty.   - Thorough search of the bladder revealed:    normal urethral meatus  Stent seen emanating from right ureteral orifice, grasped with stent graspers, and removed in entirety.    Post-Procedure: - Patient tolerated the procedure well   Assessment & Plan:    1. Ureteral lesion, native, right  Pathology reviewed with patient. Given the appearance of the abnormality and inconclusive atypia on biopsy, recommend stent removal today with plans for return to the OR in approximately 3 months for second look. This is clearly not a high-grade lesion given the negative pressure to pathology.   Patient is agreeable with this plan. - Urinalysis, Complete - lidocaine (  XYLOCAINE) 2 % jelly 1 application; Place 1 application into the urethra once. - Microscopic Examination  2. Hydronephrosis of right kidney  Suspect hydronephrosis is chronic. May consider leaving ureteral stent for longer after second look ureteroscopy to assess improvement in renal function over a longer period of time.  3. History of nephrolithiasis Currently stone free.   4. Urinary retention  Post op, resolving. Patient instructed to self catheter now only once a day as long as he is voiding. If residuals 200 cc or less. Recheck PVR next visit.   Return in about 2 months (around 04/16/2015) for to arrange repeat right ureteroscopy.  [name], MD  [facility] [address], [place], [place] [address] [phone]

## 2015-03-07 ENCOUNTER — Other Ambulatory Visit: Payer: Self-pay | Admitting: Urology

## 2015-03-07 DIAGNOSIS — N200 Calculus of kidney: Secondary | ICD-10-CM

## 2015-04-08 DIAGNOSIS — J0110 Acute frontal sinusitis, unspecified: Secondary | ICD-10-CM | POA: Diagnosis not present

## 2015-04-11 DIAGNOSIS — R5381 Other malaise: Secondary | ICD-10-CM | POA: Diagnosis not present

## 2015-04-11 DIAGNOSIS — J019 Acute sinusitis, unspecified: Secondary | ICD-10-CM | POA: Diagnosis not present

## 2015-04-11 DIAGNOSIS — Z79899 Other long term (current) drug therapy: Secondary | ICD-10-CM | POA: Diagnosis not present

## 2015-04-11 DIAGNOSIS — K5900 Constipation, unspecified: Secondary | ICD-10-CM | POA: Diagnosis not present

## 2015-04-11 DIAGNOSIS — R5383 Other fatigue: Secondary | ICD-10-CM | POA: Diagnosis not present

## 2015-04-11 DIAGNOSIS — I10 Essential (primary) hypertension: Secondary | ICD-10-CM | POA: Diagnosis not present

## 2015-04-11 DIAGNOSIS — B9689 Other specified bacterial agents as the cause of diseases classified elsewhere: Secondary | ICD-10-CM | POA: Diagnosis not present

## 2015-04-11 DIAGNOSIS — R110 Nausea: Secondary | ICD-10-CM | POA: Diagnosis not present

## 2015-04-17 ENCOUNTER — Ambulatory Visit: Payer: PPO | Admitting: Urology

## 2015-04-18 DIAGNOSIS — Z79899 Other long term (current) drug therapy: Secondary | ICD-10-CM | POA: Diagnosis not present

## 2015-04-20 ENCOUNTER — Ambulatory Visit (INDEPENDENT_AMBULATORY_CARE_PROVIDER_SITE_OTHER): Payer: PPO | Admitting: Urology

## 2015-04-20 VITALS — BP 168/77 | HR 91 | Ht 72.0 in | Wt 193.0 lb

## 2015-04-20 DIAGNOSIS — N1330 Unspecified hydronephrosis: Secondary | ICD-10-CM | POA: Diagnosis not present

## 2015-04-20 DIAGNOSIS — R339 Retention of urine, unspecified: Secondary | ICD-10-CM | POA: Diagnosis not present

## 2015-04-20 DIAGNOSIS — N289 Disorder of kidney and ureter, unspecified: Secondary | ICD-10-CM

## 2015-04-20 DIAGNOSIS — N200 Calculus of kidney: Secondary | ICD-10-CM | POA: Diagnosis not present

## 2015-04-20 DIAGNOSIS — N2889 Other specified disorders of kidney and ureter: Secondary | ICD-10-CM

## 2015-04-20 NOTE — Progress Notes (Signed)
10:40 AM  04/21/2015   Anthony Gonzales [birth year] [id number]  Referring provider: Idelle Crouch, MD [place] [facility] [place], [place] [address]  Chief Complaint  Patient presents with  . Nephrolithiasis    45monthdiscuss surgery    HPI:  82year old male who initially presented with asymptomatic incidental moderate right hydroureteronephrosis in 12/2014. He was taken to the operating room for right diagnostic ureteroscopy for workup of incidental moderate right hydronephrosis.     He is taken to the OR on 01/30/2015 at which time right ureteroscopy revealed as suspicious area within the right distal ureter with a shaggy, irregular appearance but no obvious papillary or pedunculated tumor. Brush cytology was negative for any malignancy.   Ureteral biopsy revealed some atypia favoring neoplasm but essentially was non-diagnostic.  He stent was removed with plan for return to the OR in 3 months for 2nd look ureteroscopy.   Today, he reports that he feels too unwell to go back to the operating room in the near future. He would like to put off his second look for another 3 months.  Since his surgery, he has had multiple medical problems including constipation and sinus infections.   He reports that he is never had any medical problems and was never sick before his ureteroscopy.   He does have baseline chronic renal insufficiency.  Cr 1.6-2 2015.  Most recent Cr stable at 1.4 on 04/18/15.     He also has a history of recurrent nephrolithiasis although no stone seen on CT scan as of 12/2014.  He is currently on Flomax with improvement in his urinary symptoms.  He still gets up 4x nightly but minimal bother from this.  He did also have post op urinary retention but reports that he is voiding well now.      PMH: Past Medical History  Diagnosis Date  . Hypertension   . HLD (hyperlipidemia)   . BPH (benign prostatic hyperplasia)   . Glaucoma     no drops in  3 mo pressure good  . Migraines   . Vertigo   . HTN (hypertension) 12/26/2014  . UTI (lower urinary tract infection) 12/26/2014  . Benign fibroma of prostate 08/23/2013  . Calculus of kidney 08/23/2013  . Hyponatremia 12/26/2014  . Cancer ([facility]     skin    Surgical History: Past Surgical History  Procedure Laterality Date  . Spermatocelectomy    . Colonoscopy    . Ureteroscopy Right 01/30/2015    Procedure: URETEROSCOPY/ WITH BIOPSY AND CYTOLOGY BRUSHING;  Surgeon: AHollice Espy MD;  Location: [facility] ORS;  Service: Urology;  Laterality: Right;  . Cystoscopy w/ retrogrades Right 01/30/2015    Procedure: CYSTOSCOPY WITH RETROGRADE PYELOGRAM;  Surgeon: AHollice Espy MD;  Location: [facility] ORS;  Service: Urology;  Laterality: Right;  . Cystoscopy with stent placement Right 01/30/2015    Procedure: CYSTOSCOPY WITH STENT PLACEMENT;  Surgeon: AHollice Espy MD;  Location: [facility] ORS;  Service: Urology;  Laterality: Right;    Home Medications:    Medication List       This list is accurate as of: 04/20/15 11:59 PM.  Always use your most recent med list.               aspirin EC 81 MG tablet  Take 81 mg by mouth.     benazepril-hydrochlorthiazide 20-12.5 MG tablet  Commonly known as:  LOTENSIN HCT  Take 1 tablet by mouth daily.  fenofibrate micronized 134 MG capsule  Commonly known as:  LOFIBRA  Take 134 mg by mouth.     Fish Oil 1200 MG Caps  Take by mouth.     fluticasone 50 MCG/ACT nasal spray  Commonly known as:  FLONASE  PLACE 2 SPRAYS INTO EACH NOSTRIL EVERY DAY     levofloxacin 500 MG tablet  Commonly known as:  LEVAQUIN  TAKE 1 TABLET BY MOUTH ONCE DAILY FOR 10 DAYS.     multivitamin capsule  Take by mouth.     tamsulosin 0.4 MG Caps capsule  Commonly known as:  FLOMAX  TAKE ONE CAPSULE BY MOUTH DAILY     TGT PSYLLIUM FIBER 0.52 g capsule  Generic drug:  psyllium        Allergies:  Allergies  Allergen Reactions  . Demerol [Meperidine] Nausea And  Vomiting    Other reaction(s): Unknown Other reaction(s): Unknown  . Lipitor [Atorvastatin] Swelling    Family History: Family History  Problem Relation Age of Onset  . Hypertension Mother   . Hypertension Father   . Prostate cancer Brother     Social History:  reports that he has never smoked. He has never used smokeless tobacco. He reports that he does not drink alcohol or use illicit drugs.   Physical Exam: BP 168/77 mmHg  Pulse 91  Ht 6' (1.829 m)  Wt 193 lb (87.544 kg)  BMI 26.17 kg/m2  Constitutional:  Alert and oriented, No acute distress. HEENT: [place] AT, moist mucus membranes.  Trachea midline, no masses. Cardiovascular: No clubbing, cyanosis, or edema. Respiratory: Normal respiratory effort, no increased work of breathing. Skin: No rashes, bruises or suspicious lesions. Neurologic: Grossly intact, no focal deficits, moving all 4 extremities. Psychiatric: Normal mood and affect.  Laboratory Data: Lab Results  Component Value Date   WBC 7.0 02/04/2015   HGB 13.0 02/04/2015   HCT 39.1* 02/04/2015   MCV 88.4 02/04/2015   PLT 235 02/04/2015    Lab Results  Component Value Date   CREATININE 1.45* 02/04/2015     Assessment & Plan:    1. Ureteral lesion, native, right  Given the appearance of the abnormality and inconclusive atypia on biopsy and obstructing chronic appearing hydronephrosis, I continue to recommend a second look in the near future.  I am suspicious that this is an early ureteral/ bladder malignancy. I expressed my concerns to the patient very clearly. We discussed that if the tumor is treated at an early stage, it can be curable but if care is delayed, there is a chance for developing disease outside of the collecting system/ureter at which time the disease becomes much more difficult to manage and potential left threatening.  He understands the risk of possible progression but is not interested in returning to the operating room in the next several  months.  He would prefer to have a second look some time in June once he is recovered from his other minor illnesses.  He will follow up in 3 months to have this discussion again. He was advised to follow up sooner if he changes his mind and would like to move forward as I have recommended.  2. Hydronephrosis of right kidney  Suspect hydronephrosis is chronic Plan for RUS for reassess degree (will call with results) s/p biopsy, ureteroscopy/ stent  3. History of nephrolithiasis Currently stone free.   4. Urinary retention Resolved, voiding well Continue Flomax   Return in about 3 months (around 07/18/2015) for plan for surgert.  [name], MD  [facility] [address], [place], [place] [address] [phone]

## 2015-04-21 ENCOUNTER — Encounter: Payer: Self-pay | Admitting: Urology

## 2015-05-02 ENCOUNTER — Ambulatory Visit
Admission: RE | Admit: 2015-05-02 | Discharge: 2015-05-02 | Disposition: A | Discharge status: Home / Self Care | Payer: PPO | Source: Ambulatory Visit | Attending: Urology | Admitting: Urology

## 2015-05-02 DIAGNOSIS — N1330 Unspecified hydronephrosis: Secondary | ICD-10-CM

## 2015-05-02 DIAGNOSIS — N281 Cyst of kidney, acquired: Secondary | ICD-10-CM | POA: Insufficient documentation

## 2015-05-14 ENCOUNTER — Telehealth: Payer: Self-pay

## 2015-05-14 NOTE — Telephone Encounter (Signed)
Left mess to call/SW 

## 2015-05-14 NOTE — Telephone Encounter (Signed)
-----   Message from [name], MD sent at 05/14/2015  1:11 PM EST ----- Please let this patient know that his renal ultrasound actually looks good without any significant blockage of either kidney.  I do think that follow up with second look ureteroscopy is IMPERATIVE to rule out malignancy if the future as we discussed.  I will see him again in follow up in a few months as scheduled to discuss further.    [name], MD

## 2015-05-15 NOTE — Telephone Encounter (Signed)
Left pt message to call/Sw

## 2015-05-15 NOTE — Telephone Encounter (Signed)
Patient notified has apt in May and will keep to discuss scheduling surgery

## 2015-05-22 DIAGNOSIS — Z79899 Other long term (current) drug therapy: Secondary | ICD-10-CM | POA: Diagnosis not present

## 2015-05-22 DIAGNOSIS — I10 Essential (primary) hypertension: Secondary | ICD-10-CM | POA: Diagnosis not present

## 2015-05-22 DIAGNOSIS — E782 Mixed hyperlipidemia: Secondary | ICD-10-CM | POA: Diagnosis not present

## 2015-05-29 DIAGNOSIS — J301 Allergic rhinitis due to pollen: Secondary | ICD-10-CM | POA: Diagnosis not present

## 2015-05-29 DIAGNOSIS — E782 Mixed hyperlipidemia: Secondary | ICD-10-CM | POA: Diagnosis not present

## 2015-05-29 DIAGNOSIS — Z9109 Other allergy status, other than to drugs and biological substances: Secondary | ICD-10-CM | POA: Diagnosis not present

## 2015-05-29 DIAGNOSIS — M160 Bilateral primary osteoarthritis of hip: Secondary | ICD-10-CM | POA: Diagnosis not present

## 2015-05-29 DIAGNOSIS — M25552 Pain in left hip: Secondary | ICD-10-CM | POA: Diagnosis not present

## 2015-05-29 DIAGNOSIS — I10 Essential (primary) hypertension: Secondary | ICD-10-CM | POA: Diagnosis not present

## 2015-06-28 DIAGNOSIS — D0439 Carcinoma in situ of skin of other parts of face: Secondary | ICD-10-CM | POA: Diagnosis not present

## 2015-06-28 DIAGNOSIS — L820 Inflamed seborrheic keratosis: Secondary | ICD-10-CM | POA: Diagnosis not present

## 2015-06-29 DIAGNOSIS — D485 Neoplasm of uncertain behavior of skin: Secondary | ICD-10-CM | POA: Diagnosis not present

## 2015-06-29 DIAGNOSIS — X32XXXA Exposure to sunlight, initial encounter: Secondary | ICD-10-CM | POA: Diagnosis not present

## 2015-06-29 DIAGNOSIS — Z08 Encounter for follow-up examination after completed treatment for malignant neoplasm: Secondary | ICD-10-CM | POA: Diagnosis not present

## 2015-06-29 DIAGNOSIS — Z85828 Personal history of other malignant neoplasm of skin: Secondary | ICD-10-CM | POA: Diagnosis not present

## 2015-06-29 DIAGNOSIS — L570 Actinic keratosis: Secondary | ICD-10-CM | POA: Diagnosis not present

## 2015-07-20 ENCOUNTER — Ambulatory Visit: Payer: PPO | Admitting: Urology

## 2015-07-27 ENCOUNTER — Other Ambulatory Visit: Payer: Self-pay | Admitting: Urology

## 2015-07-30 ENCOUNTER — Other Ambulatory Visit: Payer: Self-pay | Admitting: Urology

## 2015-08-03 ENCOUNTER — Ambulatory Visit: Payer: PPO | Admitting: Urology

## 2015-08-08 ENCOUNTER — Ambulatory Visit (INDEPENDENT_AMBULATORY_CARE_PROVIDER_SITE_OTHER): Payer: PPO | Admitting: Urology

## 2015-08-08 ENCOUNTER — Encounter: Payer: Self-pay | Admitting: Urology

## 2015-08-08 VITALS — BP 155/72 | HR 69 | Ht 72.0 in | Wt 198.0 lb

## 2015-08-08 DIAGNOSIS — N289 Disorder of kidney and ureter, unspecified: Secondary | ICD-10-CM

## 2015-08-08 DIAGNOSIS — N2889 Other specified disorders of kidney and ureter: Secondary | ICD-10-CM | POA: Diagnosis not present

## 2015-08-08 DIAGNOSIS — N1330 Unspecified hydronephrosis: Secondary | ICD-10-CM

## 2015-08-08 DIAGNOSIS — Z87448 Personal history of other diseases of urinary system: Secondary | ICD-10-CM

## 2015-08-08 DIAGNOSIS — N200 Calculus of kidney: Secondary | ICD-10-CM

## 2015-08-08 DIAGNOSIS — Z87898 Personal history of other specified conditions: Secondary | ICD-10-CM

## 2015-08-08 NOTE — Progress Notes (Signed)
1:37 PM  08/08/2015   Anthony Gonzales 05-26-1931 [id number]  Referring provider: Idelle Crouch, MD [place] [facility] [place], [place] [address]  Chief Complaint  Patient presents with  . Urethral lesion    1monthfollow up    HPI:  82year old male who initially presented with asymptomatic incidental moderate right hydroureteronephrosis in 12/2014. He was taken to the operating room for right diagnostic ureteroscopy for workup of incidental moderate right hydronephrosis.     He is taken to the OR on 01/30/2015 at which time right ureteroscopy revealed as suspicious area within the right distal ureter with a shaggy, irregular appearance but no obvious papillary or pedunculated tumor. Brush cytology was negative for any malignancy.   Ureteral biopsy revealed some atypia favoring neoplasm but essentially was non-diagnostic.  He stent was removed with plan for return to the OR in 3 months for 2nd look ureteroscopy.   He has since refused to return for his second look ureteroscopy and returns to the office today to discuss whether or not to proceed with this. Last time, he felt too unwell had multiple other issues going on.  Interval follow-up renal ultrasound on 05/02/2015 did show complete resolution of his hydrocele fro) bilaterally.   He does have baseline chronic renal insufficiency.  Cr 1.6-2 2015.  Most recent Cr stable at 1.4 on 04/18/15.     He also has a history of recurrent nephrolithiasis although no stone seen on CT scan as of 12/2014.  He is currently on Flomax with improvement in his urinary symptoms.  Nocturia has since improved to 2x from 4x nightly.  He did also have post op urinary retention but reports that he is voiding well now.      PMH: Past Medical History  Diagnosis Date  . Hypertension   . HLD (hyperlipidemia)   . BPH (benign prostatic hyperplasia)   . Glaucoma     no drops in 3 mo pressure good  . Migraines   . Vertigo     . HTN (hypertension) 12/26/2014  . UTI (lower urinary tract infection) 12/26/2014  . Benign fibroma of prostate 08/23/2013  . Calculus of kidney 08/23/2013  . Hyponatremia 12/26/2014  . Cancer ([facility]     skin    Surgical History: Past Surgical History  Procedure Laterality Date  . Spermatocelectomy    . Colonoscopy    . Ureteroscopy Right 01/30/2015    Procedure: URETEROSCOPY/ WITH BIOPSY AND CYTOLOGY BRUSHING;  Surgeon: AHollice Espy MD;  Location: [facility] ORS;  Service: Urology;  Laterality: Right;  . Cystoscopy w/ retrogrades Right 01/30/2015    Procedure: CYSTOSCOPY WITH RETROGRADE PYELOGRAM;  Surgeon: AHollice Espy MD;  Location: [facility] ORS;  Service: Urology;  Laterality: Right;  . Cystoscopy with stent placement Right 01/30/2015    Procedure: CYSTOSCOPY WITH STENT PLACEMENT;  Surgeon: AHollice Espy MD;  Location: [facility] ORS;  Service: Urology;  Laterality: Right;    Home Medications:    Medication List       This list is accurate as of: 08/08/15  1:37 PM.  Always use your most recent med list.               aspirin EC 81 MG tablet  Take 81 mg by mouth.     benazepril-hydrochlorthiazide 20-12.5 MG tablet  Commonly known as:  LOTENSIN HCT  Take 1 tablet by mouth daily.     DYMISTA 137-50 MCG/ACT Susp  Generic drug:  Azelastine-Fluticasone  PLACE  1 SPRAY INTO BOTH NOSTRILS 2 (TWO) TIMES DAILY.     fenofibrate micronized 134 MG capsule  Commonly known as:  LOFIBRA  Take 134 mg by mouth.     Fish Oil 1200 MG Caps  Take by mouth.     multivitamin capsule  Take by mouth.     tamsulosin 0.4 MG Caps capsule  Commonly known as:  FLOMAX  TAKE ONE CAPSULE BY MOUTH DAILY     TGT PSYLLIUM FIBER 0.52 g capsule  Generic drug:  psyllium        Allergies:  Allergies  Allergen Reactions  . Demerol [Meperidine] Nausea And Vomiting    Other reaction(s): Unknown Other reaction(s): Unknown  . Lipitor [Atorvastatin] Swelling    Family History: Family History   Problem Relation Age of Onset  . Hypertension Mother   . Hypertension Father   . Prostate cancer Brother     Social History:  reports that he has never smoked. He has never used smokeless tobacco. He reports that he does not drink alcohol or use illicit drugs.   Physical Exam: BP 155/72 mmHg  Pulse 69  Ht 6' (1.829 m)  Wt 198 lb (89.812 kg)  BMI 26.85 kg/m2  Constitutional:  Alert and oriented, No acute distress. HEENT: [place] AT, moist mucus membranes.  Trachea midline, no masses. Cardiovascular: No clubbing, cyanosis, or edema.  RRR. Respiratory: Normal respiratory effort, no increased work of breathing. CTAB. Skin: No rashes, bruises or suspicious lesions. Neurologic: Grossly intact, no focal deficits, moving all 4 extremities. Psychiatric: Normal mood and affect.  Laboratory Data: Lab Results  Component Value Date   WBC 7.0 02/04/2015   HGB 13.0 02/04/2015   HCT 39.1* 02/04/2015   MCV 88.4 02/04/2015   PLT 235 02/04/2015    Lab Results  Component Value Date   CREATININE 1.45* 02/04/2015    Assessment & Plan:    1. Ureteral lesion, native, right  Given the appearance of the abnormality and inconclusive atypia on biopsy and obstructing chronic appearing hydronephrosis, I continue to recommend a second look in the near future.  I am suspicious that this is an early ureteral/ bladder malignancy.    Patient is finally agreeable  to precede back to OR.   Reviewed risks and benefits.  He as tolerated stopping ASA 81 mg in past.    All questions answered  Will book right ureteroscopy, possible biopsy/ laser ablation, right RTG, possible stent  Preop UA/ Ucx today  2. Hydronephrosis of right kidney Resolved on RUS 04/2015 Will reassess intraoperatively with retrograde in OR  3. History of nephrolithiasis Currently stone free.   4. Nocturia/ history of retention Continue Flomax   [name], MD  [facility] [address] [place], [place] [address] [phone]

## 2015-08-09 ENCOUNTER — Telehealth: Payer: Self-pay | Admitting: Radiology

## 2015-08-09 NOTE — Telephone Encounter (Signed)
LMOM. Need to notify pt of surgery information. 

## 2015-08-10 ENCOUNTER — Encounter
Admission: RE | Admit: 2015-08-10 | Discharge: 2015-08-10 | Disposition: A | Discharge status: Home / Self Care | Payer: PPO | Source: Ambulatory Visit | Attending: Urology | Admitting: Urology

## 2015-08-10 DIAGNOSIS — Z01812 Encounter for preprocedural laboratory examination: Secondary | ICD-10-CM | POA: Insufficient documentation

## 2015-08-10 DIAGNOSIS — Z01810 Encounter for preprocedural cardiovascular examination: Secondary | ICD-10-CM | POA: Diagnosis not present

## 2015-08-10 HISTORY — DX: Unspecified osteoarthritis, unspecified site: M19.90

## 2015-08-10 LAB — BASIC METABOLIC PANEL
Anion gap: 6 (ref 5–15)
BUN: 15 mg/dL (ref 6–20)
CALCIUM: 9 mg/dL (ref 8.9–10.3)
CO2: 26 mmol/L (ref 22–32)
CREATININE: 1.27 mg/dL — AB (ref 0.61–1.24)
Chloride: 101 mmol/L (ref 101–111)
GFR calc Af Amer: 59 mL/min — ABNORMAL LOW (ref >60)
GFR, EST NON AFRICAN AMERICAN: 51 mL/min — AB (ref >60)
GLUCOSE: 117 mg/dL — AB (ref 65–99)
Potassium: 4 mmol/L (ref 3.5–5.1)
SODIUM: 133 mmol/L — AB (ref 135–145)

## 2015-08-10 NOTE — Patient Instructions (Signed)
  Your procedure is scheduled on: Monday June 12 , 2017. Report to Same Day Surgery. To find out your arrival time please call [phone] between 1PM - 3PM on Friday August 17, 2015 .  Remember: Instructions that are not followed completely may result in serious medical risk, up to and including death, or upon the discretion of your surgeon and anesthesiologist your surgery may need to be rescheduled.    _x___ 1. Do not eat food or drink liquids after midnight. No gum chewing or hard candies.     ____ 2. No Alcohol for 24 hours before or after surgery.   ____ 3. Bring all medications with you on the day of surgery if instructed.    __x__ 4. Notify your doctor if there is any change in your medical condition     (cold, fever, infections).     Do not wear jewelry, make-up, hairpins, clips or nail polish.  Do not wear lotions, powders, or perfumes. You may wear deodorant.  Do not shave 48 hours prior to surgery. Men may shave face and neck.  Do not bring valuables to the hospital.    [facility] is not responsible for any belongings or valuables.               Contacts, dentures or bridgework may not be worn into surgery.  Leave your suitcase in the car. After surgery it may be brought to your room.  For patients admitted to the hospital, discharge time is determined by your treatment team.   Patients discharged the day of surgery will not be allowed to drive home.    Please read over the following fact sheets that you were given:   [facility] Preparing for Surgery  _x_ Take these medicines the morning of surgery with A SIP OF WATER:    1. fenofibrate micronized (LOFIBRA)    ____ Fleet Enema (as directed)   ____ Use CHG Soap as directed on instruction sheet  ____ Use inhalers on the day of surgery and bring to hospital day of surgery  ____ Stop metformin 2 days prior to surgery    ____ Take 1/2 of usual insulin dose the night before surgery and none on the morning of  surgery.   _x___ Stop aspirin on Monday August 13, 2015.  _x___ Stop Anti-inflammatories such as Advil, Aleve, Ibuprofen, Motrin, Naproxen, Naprosyn, Goodies powders or aspirin products. Tylenol OK to take.   _x___ Stop Fish oil now until after surgery.    ____ Bring C-Pap to the hospital.

## 2015-08-10 NOTE — Pre-Procedure Instructions (Signed)
Compared today's EKG with on done in ED on 02/05/15 with [place], OK to proceed.

## 2015-08-10 NOTE — Pre-Procedure Instructions (Signed)
Medical clearance from Dr. Doy Hutching on chart, Moderate risk, also stop aspirin 7 days prior to surgery.

## 2015-08-13 ENCOUNTER — Telehealth: Payer: Self-pay | Admitting: Radiology

## 2015-08-13 ENCOUNTER — Other Ambulatory Visit: Payer: Self-pay

## 2015-08-13 DIAGNOSIS — Z01818 Encounter for other preprocedural examination: Secondary | ICD-10-CM

## 2015-08-13 MED ORDER — SULFAMETHOXAZOLE-TRIMETHOPRIM 800-160 MG PO TABS: 1.0000 | ORAL_TABLET | Freq: Two times a day (BID) | ORAL | Status: DC

## 2015-08-13 NOTE — Telephone Encounter (Signed)
LMOM notifying pt of +ucx & prescription sent to pharmacy. Advised pt to RTC on 6/9 for repeat ucx. Requested return call to confirm.  Notes Recorded by [name], MD on 08/12/2015 at 1:38 PM Please call and treat preop with Bactrim DS bid x 7 days. Ideally, he could have another repeat UCx after abx complete before surgery.   [name], MD

## 2015-08-17 ENCOUNTER — Other Ambulatory Visit: Payer: PPO

## 2015-08-17 DIAGNOSIS — Z01818 Encounter for other preprocedural examination: Secondary | ICD-10-CM

## 2015-08-20 ENCOUNTER — Encounter
Admission: RE | Disposition: A | Discharge status: Home / Self Care | Payer: Self-pay | Source: Ambulatory Visit | Attending: Urology

## 2015-08-20 ENCOUNTER — Ambulatory Visit: Payer: PPO | Admitting: Anesthesiology

## 2015-08-20 ENCOUNTER — Ambulatory Visit
Admission: RE | Admit: 2015-08-20 | Discharge: 2015-08-20 | Disposition: A | Discharge status: Home / Self Care | Payer: PPO | Source: Ambulatory Visit | Attending: Urology | Admitting: Urology

## 2015-08-20 ENCOUNTER — Encounter: Payer: Self-pay | Admitting: *Deleted

## 2015-08-20 DIAGNOSIS — C672 Malignant neoplasm of lateral wall of bladder: Secondary | ICD-10-CM | POA: Diagnosis not present

## 2015-08-20 DIAGNOSIS — N289 Disorder of kidney and ureter, unspecified: Secondary | ICD-10-CM | POA: Diagnosis not present

## 2015-08-20 DIAGNOSIS — Z87442 Personal history of urinary calculi: Secondary | ICD-10-CM | POA: Diagnosis not present

## 2015-08-20 DIAGNOSIS — N132 Hydronephrosis with renal and ureteral calculous obstruction: Secondary | ICD-10-CM | POA: Diagnosis not present

## 2015-08-20 DIAGNOSIS — C678 Malignant neoplasm of overlapping sites of bladder: Secondary | ICD-10-CM | POA: Diagnosis not present

## 2015-08-20 DIAGNOSIS — Z85828 Personal history of other malignant neoplasm of skin: Secondary | ICD-10-CM | POA: Diagnosis not present

## 2015-08-20 DIAGNOSIS — N2889 Other specified disorders of kidney and ureter: Secondary | ICD-10-CM

## 2015-08-20 DIAGNOSIS — D0919 Carcinoma in situ of other urinary organs: Secondary | ICD-10-CM | POA: Diagnosis not present

## 2015-08-20 DIAGNOSIS — R351 Nocturia: Secondary | ICD-10-CM | POA: Insufficient documentation

## 2015-08-20 DIAGNOSIS — Z8249 Family history of ischemic heart disease and other diseases of the circulatory system: Secondary | ICD-10-CM | POA: Diagnosis not present

## 2015-08-20 DIAGNOSIS — Z7982 Long term (current) use of aspirin: Secondary | ICD-10-CM | POA: Insufficient documentation

## 2015-08-20 DIAGNOSIS — Z79899 Other long term (current) drug therapy: Secondary | ICD-10-CM | POA: Diagnosis not present

## 2015-08-20 DIAGNOSIS — Z888 Allergy status to other drugs, medicaments and biological substances status: Secondary | ICD-10-CM | POA: Insufficient documentation

## 2015-08-20 DIAGNOSIS — M1990 Unspecified osteoarthritis, unspecified site: Secondary | ICD-10-CM | POA: Diagnosis not present

## 2015-08-20 DIAGNOSIS — G43909 Migraine, unspecified, not intractable, without status migrainosus: Secondary | ICD-10-CM | POA: Diagnosis not present

## 2015-08-20 DIAGNOSIS — Z885 Allergy status to narcotic agent status: Secondary | ICD-10-CM | POA: Diagnosis not present

## 2015-08-20 DIAGNOSIS — R42 Dizziness and giddiness: Secondary | ICD-10-CM | POA: Diagnosis not present

## 2015-08-20 DIAGNOSIS — E785 Hyperlipidemia, unspecified: Secondary | ICD-10-CM | POA: Insufficient documentation

## 2015-08-20 DIAGNOSIS — C674 Malignant neoplasm of posterior wall of bladder: Secondary | ICD-10-CM | POA: Diagnosis not present

## 2015-08-20 DIAGNOSIS — N1330 Unspecified hydronephrosis: Secondary | ICD-10-CM | POA: Diagnosis not present

## 2015-08-20 DIAGNOSIS — Z8042 Family history of malignant neoplasm of prostate: Secondary | ICD-10-CM | POA: Diagnosis not present

## 2015-08-20 DIAGNOSIS — N400 Enlarged prostate without lower urinary tract symptoms: Secondary | ICD-10-CM | POA: Insufficient documentation

## 2015-08-20 DIAGNOSIS — I10 Essential (primary) hypertension: Secondary | ICD-10-CM | POA: Insufficient documentation

## 2015-08-20 DIAGNOSIS — C661 Malignant neoplasm of right ureter: Secondary | ICD-10-CM | POA: Diagnosis not present

## 2015-08-20 DIAGNOSIS — H409 Unspecified glaucoma: Secondary | ICD-10-CM | POA: Insufficient documentation

## 2015-08-20 DIAGNOSIS — D098 Carcinoma in situ of other specified sites: Secondary | ICD-10-CM | POA: Diagnosis not present

## 2015-08-20 DIAGNOSIS — N200 Calculus of kidney: Secondary | ICD-10-CM | POA: Diagnosis not present

## 2015-08-20 DIAGNOSIS — C679 Malignant neoplasm of bladder, unspecified: Secondary | ICD-10-CM | POA: Diagnosis not present

## 2015-08-20 HISTORY — PX: HOLMIUM LASER APPLICATION: SHX5852

## 2015-08-20 HISTORY — PX: CYSTOSCOPY W/ URETERAL STENT PLACEMENT: SHX1429

## 2015-08-20 HISTORY — PX: URETEROSCOPY: SHX842

## 2015-08-20 LAB — CULTURE, URINE COMPREHENSIVE

## 2015-08-20 SURGERY — URETEROSCOPY
Anesthesia: General | Laterality: Right

## 2015-08-20 MED ORDER — MIDAZOLAM HCL 2 MG/2ML IJ SOLN
INTRAMUSCULAR | Status: DC | PRN
  Administered 2015-08-20: 2 mg via INTRAVENOUS

## 2015-08-20 MED ORDER — LIDOCAINE HCL (CARDIAC) 20 MG/ML IV SOLN
INTRAVENOUS | Status: DC | PRN
  Administered 2015-08-20: 30 mg via INTRAVENOUS

## 2015-08-20 MED ORDER — FAMOTIDINE 20 MG PO TABS
ORAL_TABLET | ORAL | Status: AC
  Administered 2015-08-20: 20 mg via ORAL
  Filled 2015-08-20: qty 1

## 2015-08-20 MED ORDER — OXYBUTYNIN CHLORIDE 5 MG PO TABS: 5.0000 mg | ORAL_TABLET | Freq: Three times a day (TID) | ORAL | Status: DC | PRN

## 2015-08-20 MED ORDER — DEXAMETHASONE SODIUM PHOSPHATE 10 MG/ML IJ SOLN
INTRAMUSCULAR | Status: DC | PRN
  Administered 2015-08-20: 10 mg via INTRAVENOUS

## 2015-08-20 MED ORDER — FENTANYL CITRATE (PF) 100 MCG/2ML IJ SOLN: 25.0000 ug | INTRAMUSCULAR | Status: DC | PRN

## 2015-08-20 MED ORDER — LACTATED RINGERS IV SOLN
INTRAVENOUS | Status: DC
  Administered 2015-08-20 (×3): via INTRAVENOUS

## 2015-08-20 MED ORDER — CEFAZOLIN SODIUM-DEXTROSE 2-4 GM/100ML-% IV SOLN
INTRAVENOUS | Status: AC
  Administered 2015-08-20: 2 g via INTRAVENOUS
  Filled 2015-08-20: qty 100

## 2015-08-20 MED ORDER — PROPOFOL 10 MG/ML IV BOLUS
INTRAVENOUS | Status: DC | PRN
  Administered 2015-08-20: 150 mg via INTRAVENOUS

## 2015-08-20 MED ORDER — CEFAZOLIN SODIUM-DEXTROSE 2-4 GM/100ML-% IV SOLN
2.0000 g | Freq: Once | INTRAVENOUS | Status: AC
  Administered 2015-08-20: 2 g via INTRAVENOUS

## 2015-08-20 MED ORDER — HYDROCODONE-ACETAMINOPHEN 5-325 MG PO TABS: 1.0000 | ORAL_TABLET | Freq: Four times a day (QID) | ORAL | Status: DC | PRN

## 2015-08-20 MED ORDER — FENTANYL CITRATE (PF) 100 MCG/2ML IJ SOLN
INTRAMUSCULAR | Status: DC | PRN
  Administered 2015-08-20: 50 ug via INTRAVENOUS
  Administered 2015-08-20: 100 ug via INTRAVENOUS
  Administered 2015-08-20: 50 ug via INTRAVENOUS

## 2015-08-20 MED ORDER — DOCUSATE SODIUM 100 MG PO CAPS: 100.0000 mg | ORAL_CAPSULE | Freq: Two times a day (BID) | ORAL | Status: DC

## 2015-08-20 MED ORDER — FAMOTIDINE 20 MG PO TABS
20.0000 mg | ORAL_TABLET | Freq: Once | ORAL | Status: AC
  Administered 2015-08-20: 20 mg via ORAL

## 2015-08-20 MED ORDER — IOTHALAMATE MEGLUMINE 43 % IV SOLN
INTRAVENOUS | Status: DC | PRN
  Administered 2015-08-20: 30 mL

## 2015-08-20 MED ORDER — ONDANSETRON HCL 4 MG/2ML IJ SOLN: 4.0000 mg | Freq: Once | INTRAMUSCULAR | Status: DC | PRN

## 2015-08-20 MED ORDER — ONDANSETRON HCL 4 MG/2ML IJ SOLN
INTRAMUSCULAR | Status: DC | PRN
  Administered 2015-08-20: 4 mg via INTRAVENOUS

## 2015-08-20 SURGICAL SUPPLY — 35 items
ADAPTER SCOPE UROLOK II (MISCELLANEOUS) ×4 IMPLANT
BAG DRAIN CYSTO-URO LG1000N (MISCELLANEOUS) ×4 IMPLANT
BASKET 3WIRE GEMINI 2.4X120 (MISCELLANEOUS) ×4 IMPLANT
BRUSH CYTOL RX .035 2.1X8X200 (MISCELLANEOUS) ×4 IMPLANT
CATH URETL 5X70 OPEN END (CATHETERS) ×8 IMPLANT
CNTNR SPEC 2.5X3XGRAD LEK (MISCELLANEOUS) ×2
CONRAY 43 FOR UROLOGY 50M (MISCELLANEOUS) ×4 IMPLANT
CONT SPEC 4OZ STER OR WHT (MISCELLANEOUS) ×2
CONTAINER SPEC 2.5X3XGRAD LEK (MISCELLANEOUS) ×2 IMPLANT
DRAPE UTILITY 15X26 TOWEL STRL (DRAPES) ×4 IMPLANT
DRSG TELFA 3X8 NADH (GAUZE/BANDAGES/DRESSINGS) ×4 IMPLANT
FORCEPS BIOP PIRANHA Y (CUTTING FORCEPS) ×4 IMPLANT
GLOVE BIO SURGEON STRL SZ 6.5 (GLOVE) ×3 IMPLANT
GLOVE BIO SURGEON STRL SZ7 (GLOVE) ×8 IMPLANT
GLOVE BIO SURGEONS STRL SZ 6.5 (GLOVE) ×1
GOWN STRL REUS W/ TWL LRG LVL3 (GOWN DISPOSABLE) ×4 IMPLANT
GOWN STRL REUS W/TWL LRG LVL3 (GOWN DISPOSABLE) ×4
INTRODUCER DILATOR DOUBLE (INTRODUCER) ×4 IMPLANT
KIT RM TURNOVER CYSTO AR (KITS) ×4 IMPLANT
LASER FIBER 200M SMARTSCOPE (Laser) ×4 IMPLANT
NDL SAFETY ECLIPSE 18X1.5 (NEEDLE) ×2 IMPLANT
NEEDLE HYPO 18GX1.5 SHARP (NEEDLE) ×2
NEEDLE HYPO 25X1 1.5 SAFETY (NEEDLE) ×4 IMPLANT
PACK CYSTO AR (MISCELLANEOUS) ×4 IMPLANT
PREP PVP WINGED SPONGE (MISCELLANEOUS) ×4 IMPLANT
PUMP SINGLE ACTION SAP (PUMP) ×4 IMPLANT
SENSORWIRE 0.038 NOT ANGLED (WIRE) ×12
SET CYSTO W/LG BORE CLAMP LF (SET/KITS/TRAYS/PACK) ×4 IMPLANT
SHEATH URETERAL 12FRX35CM (MISCELLANEOUS) ×4 IMPLANT
SOL .9 NS 3000ML IRR  AL (IV SOLUTION) ×2
SOL .9 NS 3000ML IRR UROMATIC (IV SOLUTION) ×2 IMPLANT
STENT URET 6FRX26 CONTOUR (STENTS) ×4 IMPLANT
SURGILUBE 2OZ TUBE FLIPTOP (MISCELLANEOUS) ×4 IMPLANT
WATER STERILE IRR 1000ML POUR (IV SOLUTION) ×4 IMPLANT
WIRE SENSOR 0.038 NOT ANGLED (WIRE) ×6 IMPLANT

## 2015-08-20 NOTE — Transfer of Care (Signed)
Immediate Anesthesia Transfer of Care Note  Patient: [name]  Procedure(s) Performed: Procedure(s): URETEROSCOPY (Right) CYSTOSCOPY WITH RETROGRADE PYELOGRAM/POSSIBLE URETERAL STENT PLACEMENT/BLADDER BIOPSY (Right)  HOLMIUM LASER APPLICATION (N/A)  Patient Location: PACU  Anesthesia Type:General  Level of Consciousness: awake, alert  and oriented  Airway & Oxygen Therapy: Patient Spontanous Breathing and Patient connected to face mask oxygen  Post-op Assessment: Report given to RN and Post -op Vital signs reviewed and stable  Post vital signs: Reviewed and stable  Last Vitals:  Filed Vitals:   08/20/15 1153 08/20/15 1200  BP:  125/70  Pulse: 88 90  Temp:    Resp: 19 16    Last Pain: There were no vitals filed for this visit.       Complications: No apparent anesthesia complications

## 2015-08-20 NOTE — H&P (View-Only) (Signed)
1:37 PM  08/08/2015   Anthony Gonzales 02/14/1932 [id number]  Referring provider: Idelle Crouch, MD [place] [facility] [place], [place] [address]  Chief Complaint  Patient presents with  . Urethral lesion    80monthfollow up    HPI:  82year old male who initially presented with asymptomatic incidental moderate right hydroureteronephrosis in 12/2014. He was taken to the operating room for right diagnostic ureteroscopy for workup of incidental moderate right hydronephrosis.     He is taken to the OR on 01/30/2015 at which time right ureteroscopy revealed as suspicious area within the right distal ureter with a shaggy, irregular appearance but no obvious papillary or pedunculated tumor. Brush cytology was negative for any malignancy.   Ureteral biopsy revealed some atypia favoring neoplasm but essentially was non-diagnostic.  He stent was removed with plan for return to the OR in 3 months for 2nd look ureteroscopy.   He has since refused to return for his second look ureteroscopy and returns to the office today to discuss whether or not to proceed with this. Last time, he felt too unwell had multiple other issues going on.  Interval follow-up renal ultrasound on 05/02/2015 did show complete resolution of his hydrocele fro) bilaterally.   He does have baseline chronic renal insufficiency.  Cr 1.6-2 2015.  Most recent Cr stable at 1.4 on 04/18/15.     He also has a history of recurrent nephrolithiasis although no stone seen on CT scan as of 12/2014.  He is currently on Flomax with improvement in his urinary symptoms.  Nocturia has since improved to 2x from 4x nightly.  He did also have post op urinary retention but reports that he is voiding well now.      PMH: Past Medical History  Diagnosis Date  . Hypertension   . HLD (hyperlipidemia)   . BPH (benign prostatic hyperplasia)   . Glaucoma     no drops in 3 mo pressure good  . Migraines   . Vertigo     . HTN (hypertension) 12/26/2014  . UTI (lower urinary tract infection) 12/26/2014  . Benign fibroma of prostate 08/23/2013  . Calculus of kidney 08/23/2013  . Hyponatremia 12/26/2014  . Cancer ([facility]     skin    Surgical History: Past Surgical History  Procedure Laterality Date  . Spermatocelectomy    . Colonoscopy    . Ureteroscopy Right 01/30/2015    Procedure: URETEROSCOPY/ WITH BIOPSY AND CYTOLOGY BRUSHING;  Surgeon: AHollice Espy MD;  Location: [facility] ORS;  Service: Urology;  Laterality: Right;  . Cystoscopy w/ retrogrades Right 01/30/2015    Procedure: CYSTOSCOPY WITH RETROGRADE PYELOGRAM;  Surgeon: AHollice Espy MD;  Location: [facility] ORS;  Service: Urology;  Laterality: Right;  . Cystoscopy with stent placement Right 01/30/2015    Procedure: CYSTOSCOPY WITH STENT PLACEMENT;  Surgeon: AHollice Espy MD;  Location: [facility] ORS;  Service: Urology;  Laterality: Right;    Home Medications:    Medication List       This list is accurate as of: 08/08/15  1:37 PM.  Always use your most recent med list.               aspirin EC 81 MG tablet  Take 81 mg by mouth.     benazepril-hydrochlorthiazide 20-12.5 MG tablet  Commonly known as:  LOTENSIN HCT  Take 1 tablet by mouth daily.     DYMISTA 137-50 MCG/ACT Susp  Generic drug:  Azelastine-Fluticasone  PLACE  1 SPRAY INTO BOTH NOSTRILS 2 (TWO) TIMES DAILY.     fenofibrate micronized 134 MG capsule  Commonly known as:  LOFIBRA  Take 134 mg by mouth.     Fish Oil 1200 MG Caps  Take by mouth.     multivitamin capsule  Take by mouth.     tamsulosin 0.4 MG Caps capsule  Commonly known as:  FLOMAX  TAKE ONE CAPSULE BY MOUTH DAILY     TGT PSYLLIUM FIBER 0.52 g capsule  Generic drug:  psyllium        Allergies:  Allergies  Allergen Reactions  . Demerol [Meperidine] Nausea And Vomiting    Other reaction(s): Unknown Other reaction(s): Unknown  . Lipitor [Atorvastatin] Swelling    Family History: Family History   Problem Relation Age of Onset  . Hypertension Mother   . Hypertension Father   . Prostate cancer Brother     Social History:  reports that he has never smoked. He has never used smokeless tobacco. He reports that he does not drink alcohol or use illicit drugs.   Physical Exam: BP 155/72 mmHg  Pulse 69  Ht 6' (1.829 m)  Wt 198 lb (89.812 kg)  BMI 26.85 kg/m2  Constitutional:  Alert and oriented, No acute distress. HEENT: [place] AT, moist mucus membranes.  Trachea midline, no masses. Cardiovascular: No clubbing, cyanosis, or edema.  RRR. Respiratory: Normal respiratory effort, no increased work of breathing. CTAB. Skin: No rashes, bruises or suspicious lesions. Neurologic: Grossly intact, no focal deficits, moving all 4 extremities. Psychiatric: Normal mood and affect.  Laboratory Data: Lab Results  Component Value Date   WBC 7.0 02/04/2015   HGB 13.0 02/04/2015   HCT 39.1* 02/04/2015   MCV 88.4 02/04/2015   PLT 235 02/04/2015    Lab Results  Component Value Date   CREATININE 1.45* 02/04/2015    Assessment & Plan:    1. Ureteral lesion, native, right  Given the appearance of the abnormality and inconclusive atypia on biopsy and obstructing chronic appearing hydronephrosis, I continue to recommend a second look in the near future.  I am suspicious that this is an early ureteral/ bladder malignancy.    Patient is finally agreeable  to precede back to OR.   Reviewed risks and benefits.  He as tolerated stopping ASA 81 mg in past.    All questions answered  Will book right ureteroscopy, possible biopsy/ laser ablation, right RTG, possible stent  Preop UA/ Ucx today  2. Hydronephrosis of right kidney Resolved on RUS 04/2015 Will reassess intraoperatively with retrograde in OR  3. History of nephrolithiasis Currently stone free.   4. Nocturia/ history of retention Continue Flomax   [name], MD  [facility] [address] [place], [place] [address] [phone]

## 2015-08-20 NOTE — Anesthesia Preprocedure Evaluation (Addendum)
Anesthesia Evaluation  Patient identified by MRN, date of birth, ID band Patient awake    Reviewed: Allergy & Precautions, NPO status , Patient's Chart, lab work & pertinent test results, reviewed documented beta blocker date and time   Airway Mallampati: II  TM Distance: >3 FB     Dental  (+) Chipped   Pulmonary           Cardiovascular hypertension, Pt. on medications      Neuro/Psych  Headaches,    GI/Hepatic   Endo/Other    Renal/GU Renal InsufficiencyRenal disease     Musculoskeletal  (+) Arthritis ,   Abdominal   Peds  Hematology   Anesthesia Other Findings EKG OK from 6/2.  Reproductive/Obstetrics                            Anesthesia Physical Anesthesia Plan  ASA: III  Anesthesia Plan: General   Post-op Pain Management:    Induction: Intravenous  Airway Management Planned: Oral ETT and LMA  Additional Equipment:   Intra-op Plan:   Post-operative Plan:   Informed Consent: I have reviewed the patients History and Physical, chart, labs and discussed the procedure including the risks, benefits and alternatives for the proposed anesthesia with the patient or authorized representative who has indicated his/her understanding and acceptance.     Plan Discussed with: CRNA  Anesthesia Plan Comments:         Anesthesia Quick Evaluation

## 2015-08-20 NOTE — Op Note (Signed)
Date of procedure: 08/20/2015  Preoperative diagnosis:  1. Right hydroureteronephrosis 2. Right distal ureteral abnormality/mass  Postoperative diagnosis:  1. Same as above 2. Bladder erythema   Procedure: 3. Right ureteroscopy 4. Right distal ureteral biopsy/ brush cytology  5. Laser ablation of right distal ureteral mass 6. Right retrograde pyelogram 7. Right ureteral stent placement 8. Cystoscopy 9. 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  EBL: Minimal  Specimens: Right distal ureteral brush cytology, right distal ureteral mass biopsy, bladder biopsy x multiple  Drains: 6 x 26 French double-J ureteral stent on right  Indication: [name] is a 82 y.o. patient with history of incidental right hydroureteronephrosis to underwent further workup in 01/2015 at which time there is a right distal ureteral abnormality highly suspicious for malignancy. Biopsy was favoring atypia but nondiagnostic. He was counseled to return to the operating room for a second look but declined on several occasions. After greater than 6 months, he has finally agreed to return for a second look with biopsy, laser ablation as needed.  Preoperative urine culture did grow a small amount of Escherichia coli was treated appropriately with Bactrim 1 week which he completed yesterday. All urine culture was negative. After reviewing the management options for treatment, he elected to proceed with the above surgical procedure(s). We have discussed the potential benefits and risks of the procedure, side effects of the proposed treatment, the likelihood of the patient achieving the goals of the procedure, and any potential problems that might occur during the procedure or  recuperation. Informed consent has been obtained.  Description of procedure:  The patient was taken to the operating room and general anesthesia was induced.  The patient was placed in the dorsal lithotomy position, prepped and draped in the usual sterile fashion, and preoperative antibiotics were administered. A preoperative time-out was performed.   At this point in time, a rigid 21 French cystoscope was advanced per urethra into the bladder.  Again, the prostate was noted to be enlarged with trilobar coaptation and the median lobe component extending into the bladder neck. The trigone was somewhat distorted as such and the UOs were fairly close to the UO but identifiable. The bladder mucosa was carefully inspected which revealed 2 discrete erythematous patches one on the posterior wall and one on the right posterior lateral wall which was highly concerning for carcinoma in situ although given his history of recent urinary tract infection, could be residual cystitis.  Attention was turned to the right UO which is able to be cannulated with the 5 [place] open-ended ureteral catheter just within the os. A gentle retrograde pyelogram was performed which revealed mild right renal pelvic fullness/mild hydroureteronephrosis with a dilated ureter down to the level of the bladder. There were no obvious filling defects.  At this point in time, a wire was placed up to level of the kidney.   This was snapped in place as a safety wire. A semirigid 5 French ureteroscope was brought in and advanced through the distal UO. Just within the intramural ureter, again there was a shaggy, irregular ureteral mucosa noted. There appeared to be a nodular component at the 11:00 position highly concerning for a ureteral tumor. There was no obvious papillary fronds appreciated. The wire was passed beyond this level at which time normal mucosa was appreciated  with a dilated ureter. The scope was then backed down to level of the  abnormality just within the UO. A cytology brush was used in order to obtain some cells. I then attempted to use a Gemini basket unsuccessfully for a ureteral biopsy. Prominent forceps were then used to take 2 minute fragments from the distal ureter. Technically, biopsy was very challenging given no obvious discrete papillary component. Finally, a 200  laser fiber was brought in and using various settings, most effectively 0.4 J and 15 Hz, the mucosa which appeared irregular was ablated within this area. Once complete, the ureteroscope was removed. The safety wire was then backloaded over a rigid cystoscope and a 6 x 26 French double-J ureteral stent was placed over the wire up to level of the renal pelvis. The wire was partially drawn until full coil was noted within the renal pelvis. The wire was then fully withdrawn and a full coil was noted within the bladder.  Finally, attention was turned to biopsy of the erythematous velvety patches. Cold cup biopsy forceps were used to perform 3 targeted biopsies on the posterior wall and right lateral walls of the bladder. A Bugbee electrocautery was used to obtain careful hemostasis. Once deemed satisfactory, the bladder was then drained and scope was removed. The patient was then cleaned and dried, repositioned the supine position, reversed from anesthesia, taken to the PACU in stable condition. Next  Plan: Patient will follow-up in 2 weeks to discuss pathology results. If there is evidence of ureteral tumor and CIS, may leave stent in place for BCG treatment. Alternatively, if pathology and distal ureter negative, we will consider stent removal at that time. Findings were discussed with the patient's wife in detail.  [name], M.D.

## 2015-08-20 NOTE — Anesthesia Postprocedure Evaluation (Signed)
Anesthesia Post Note  Patient: Anthony Gonzales  Procedure(s) Performed: Procedure(s) (LRB): URETEROSCOPY (Right) CYSTOSCOPY WITH RETROGRADE PYELOGRAM/POSSIBLE URETERAL STENT PLACEMENT/BLADDER BIOPSY (Right)  HOLMIUM LASER APPLICATION (N/A)  Patient location during evaluation: PACU Anesthesia Type: General Level of consciousness: awake and alert Pain management: pain level controlled Vital Signs Assessment: post-procedure vital signs reviewed and stable Respiratory status: spontaneous breathing, nonlabored ventilation, respiratory function stable and patient connected to nasal cannula oxygen Cardiovascular status: blood pressure returned to baseline and stable Postop Assessment: no signs of nausea or vomiting Anesthetic complications: no    Last Vitals:  Filed Vitals:   08/20/15 1217 08/20/15 1246  BP: 137/58 146/68  Pulse: 86 86  Temp: 36 C   Resp: 16 16    Last Pain: There were no vitals filed for this visit.               Rachid Parham S

## 2015-08-20 NOTE — Interval H&P Note (Signed)
History and Physical Interval Note:  08/20/2015 10:03 AM  Anthony Gonzales  has presented today for surgery, with the diagnosis of URETERAL LESION, HYDRONEPHROSIS OF RIGHT KIDNEY  The various methods of treatment have been discussed with the patient and family. After consideration of risks, benefits and other options for treatment, the patient has consented to  Procedure(s): URETEROSCOPY (Right) CYSTOSCOPY WITH RETROGRADE PYELOGRAM/POSSIBLE URETERAL STENT PLACEMENT/POSSIBLE BIOPSY (Right) POSSIBLE HOLMIUM LASER APPLICATION (N/A) as a surgical intervention .  The patient's history has been reviewed, patient examined, no change in status, stable for surgery.  I have reviewed the patient's chart and labs.  Questions were answered to the patient's satisfaction.     [name]

## 2015-08-20 NOTE — Discharge Instructions (Signed)
You have a ureteral stent in place.  This is a tube that extends from your kidney to your bladder.  This may cause urinary bleeding, burning with urination, and urinary frequency.  Please call our office or present to the ED if you develop fevers >101 or pain which is not able to be controlled with oral pain medications.  You may be given either Flomax and/ or ditropan to help with bladder spasms and stent pain in addition to pain medications.    [facility] [address], [place], [place] [address] [phone]    AMBULATORY SURGERY  DISCHARGE INSTRUCTIONS   1) The drugs that you were given will stay in your system until tomorrow so for the next 24 hours you should not:  A) Drive an automobile B) Make any legal decisions C) Drink any alcoholic beverage   2) You may resume regular meals tomorrow.  Today it is better to start with liquids and gradually work up to solid foods.  You may eat anything you prefer, but it is better to start with liquids, then soup and crackers, and gradually work up to solid foods.   3) Please notify your doctor immediately if you have any unusual bleeding, trouble breathing, redness and pain at the surgery site, drainage, fever, or pain not relieved by medication.    4) Additional Instructions: Force fluids. Avoid caffeine   Please contact your physician with any problems or Same Day Surgery at [phone], Monday through Friday 6 am to 4 pm, or [place] at [facility] number at [phone].

## 2015-08-21 LAB — SURGICAL PATHOLOGY

## 2015-08-21 LAB — CYTOLOGY - NON PAP

## 2015-08-22 DIAGNOSIS — L905 Scar conditions and fibrosis of skin: Secondary | ICD-10-CM | POA: Diagnosis not present

## 2015-08-22 DIAGNOSIS — D0439 Carcinoma in situ of skin of other parts of face: Secondary | ICD-10-CM | POA: Diagnosis not present

## 2015-08-31 ENCOUNTER — Telehealth: Payer: Self-pay | Admitting: Radiology

## 2015-08-31 ENCOUNTER — Ambulatory Visit (INDEPENDENT_AMBULATORY_CARE_PROVIDER_SITE_OTHER): Payer: PPO | Admitting: Urology

## 2015-08-31 ENCOUNTER — Other Ambulatory Visit: Payer: PPO | Admitting: Urology

## 2015-08-31 VITALS — BP 151/85 | HR 86 | Ht 72.0 in | Wt 196.0 lb

## 2015-08-31 DIAGNOSIS — N200 Calculus of kidney: Secondary | ICD-10-CM | POA: Diagnosis not present

## 2015-08-31 DIAGNOSIS — R351 Nocturia: Secondary | ICD-10-CM | POA: Diagnosis not present

## 2015-08-31 DIAGNOSIS — C661 Malignant neoplasm of right ureter: Secondary | ICD-10-CM

## 2015-08-31 DIAGNOSIS — C674 Malignant neoplasm of posterior wall of bladder: Secondary | ICD-10-CM

## 2015-08-31 DIAGNOSIS — Z87442 Personal history of urinary calculi: Secondary | ICD-10-CM | POA: Diagnosis not present

## 2015-08-31 LAB — URINALYSIS, COMPLETE
Bilirubin, UA: NEGATIVE
Glucose, UA: NEGATIVE
KETONES UA: NEGATIVE
NITRITE UA: NEGATIVE
Specific Gravity, UA: 1.01 (ref 1.005–1.030)
Urobilinogen, Ur: 0.2 mg/dL (ref 0.2–1.0)
pH, UA: 7 (ref 5.0–7.5)

## 2015-08-31 LAB — MICROSCOPIC EXAMINATION

## 2015-08-31 NOTE — Telephone Encounter (Signed)
Notified pt of surgery scheduled 09/12/15, pre-admit testing appt on 09/04/15 '@1'$ :00 & to call 09/10/15 for arrival time to SDS. Advised pt to hold ASA '81mg'$  & fish oil 7 days prior to surgery per Dr Erlene Quan. Pt voices understanding.

## 2015-09-03 ENCOUNTER — Telehealth: Payer: Self-pay

## 2015-09-03 ENCOUNTER — Ambulatory Visit (INDEPENDENT_AMBULATORY_CARE_PROVIDER_SITE_OTHER): Payer: PPO

## 2015-09-03 VITALS — BP 135/84 | HR 82 | Temp 98.0°F | Wt 196.2 lb

## 2015-09-03 DIAGNOSIS — J019 Acute sinusitis, unspecified: Secondary | ICD-10-CM | POA: Diagnosis not present

## 2015-09-03 DIAGNOSIS — R040 Epistaxis: Secondary | ICD-10-CM | POA: Diagnosis not present

## 2015-09-03 DIAGNOSIS — I10 Essential (primary) hypertension: Secondary | ICD-10-CM | POA: Diagnosis not present

## 2015-09-03 DIAGNOSIS — N390 Urinary tract infection, site not specified: Secondary | ICD-10-CM | POA: Diagnosis not present

## 2015-09-03 LAB — CULTURE, URINE COMPREHENSIVE

## 2015-09-03 LAB — URINALYSIS, COMPLETE
BILIRUBIN UA: NEGATIVE
GLUCOSE, UA: NEGATIVE
KETONES UA: NEGATIVE
NITRITE UA: NEGATIVE
SPEC GRAV UA: 1.015 (ref 1.005–1.030)
Urobilinogen, Ur: 0.2 mg/dL (ref 0.2–1.0)
pH, UA: 6 (ref 5.0–7.5)

## 2015-09-03 LAB — BLADDER SCAN AMB NON-IMAGING: Scan Result: 348

## 2015-09-03 LAB — MICROSCOPIC EXAMINATION: Epithelial Cells (non renal): NONE SEEN /hpf (ref 0–10)

## 2015-09-03 NOTE — Progress Notes (Addendum)
Pt came with the c/o not being able to "make water". PVR-342. VS-196.2lb, 135/84, 82, 98.0. Pt stated that he was in the office 55moago and was instructed on CIC. Pt stated that he quit CIC because he felt like he was "normal" again. Pt then proceeded to elaborate that starting last week he was not able to urinate and started to perform CIC again. Pt stated that he if cath's himself before bed he can sleep 3hrs and then hes up every hour. If he does not cath himself he gets up every hour. Pt c/o having a low grade fever at night of 100.2, but in the mornings the fever is gone. Pt had a u/a and cx done on Friday after stent removal.

## 2015-09-03 NOTE — Telephone Encounter (Signed)
Pt called stating "I am having trouble making water". Pt stated that he has only been able to "dribble" since Saturday. Pt denied lower abd pain. Pt was added to nurses schedule for a PVR.

## 2015-09-03 NOTE — Addendum Note (Signed)
Addended by: Lestine Box on: 09/03/2015 03:11 PM   Modules accepted: Orders

## 2015-09-04 ENCOUNTER — Telehealth: Payer: Self-pay

## 2015-09-04 ENCOUNTER — Encounter
Admission: RE | Admit: 2015-09-04 | Discharge: 2015-09-04 | Disposition: A | Discharge status: Home / Self Care | Payer: PPO | Source: Ambulatory Visit | Attending: Urology | Admitting: Urology

## 2015-09-04 DIAGNOSIS — Z01812 Encounter for preprocedural laboratory examination: Secondary | ICD-10-CM | POA: Insufficient documentation

## 2015-09-04 DIAGNOSIS — N390 Urinary tract infection, site not specified: Secondary | ICD-10-CM

## 2015-09-04 HISTORY — DX: Restless legs syndrome: G25.81

## 2015-09-04 LAB — POTASSIUM: POTASSIUM: 3.9 mmol/L (ref 3.5–5.1)

## 2015-09-04 MED ORDER — LEVOFLOXACIN 500 MG PO TABS: 500.0000 mg | ORAL_TABLET | Freq: Every day | ORAL | Status: DC

## 2015-09-04 NOTE — Telephone Encounter (Signed)
Per Zara Council, PAC patient was notified that urine culture came back positive. Patient states he is taking Levaquin '250mg'$  once daily for ten days starting yesterday due to a sinus infection. Per Larene Beach patient needs a new script for Levaquin '500mg'$  once daily for ten days to treat the bladder infection, a new script was sent to patient's local pharmacy. Patient was told his urine would be checked again prior to surgery next week, patient verbalized understanding.

## 2015-09-04 NOTE — Patient Instructions (Signed)
  Your procedure is scheduled [id number] 5, 2017 (Wednesday) Report to Same Day Surgery 2nd floor [facility] To find out your arrival time please call [phone] between 1PM - 3PM on September 10, 2015 (Monday)  Remember: Instructions that are not followed completely may result in serious medical risk, up to and including death, or upon the discretion of your surgeon and anesthesiologist your surgery may need to be rescheduled.    _x___ 1. Do not eat food or drink liquids after midnight. No gum chewing or hard candies.     __x__ 2. No Alcohol for 24 hours before or after surgery.   __x__3. No Smoking for 24 prior to surgery.   ____  4. Bring all medications with you on the day of surgery if instructed.    __x__ 5. Notify your doctor if there is any change in your medical condition     (cold, fever, infections).     Do not wear jewelry, make-up, hairpins, clips or nail polish.  Do not wear lotions, powders, or perfumes. You may wear deodorant.  Do not shave 48 hours prior to surgery. Men may shave face and neck.  Do not bring valuables to the hospital.    [facility] is not responsible for any belongings or valuables.               Contacts, dentures or bridgework may not be worn into surgery.  Leave your suitcase in the car. After surgery it may be brought to your room.  For patients admitted to the hospital, discharge time is determined by your treatment team.   Patients discharged the day of surgery will not be allowed to drive home.    Please read over the following fact sheets that you were given:   [facility] Preparing for Surgery and or MRSA Information   _x___ Take these medicines the morning of surgery with A SIP OF WATER:    1. Fenofibrate  2.  3.  4.  5.  6.  ____ Fleet Enema (as directed)   ___ Use CHG Soap or sage wipes as directed on instruction sheet   ____ Use inhalers on the day of surgery and bring to hospital day of surgery  ____ Stop metformin 2 days  prior to surgery    ____ Take 1/2 of usual insulin dose the night before surgery and none on the morning of surgery         .   _x___ Stop aspirin or coumadin, or plavix (Patient stopped Aspirin on June 24)  _x__ Stop Anti-inflammatories such as Advil, Aleve, Ibuprofen, Motrin, Naproxen,          Naprosyn, Goodies powders or aspirin products. Ok to take Tylenol.   _x___ Stop supplements until after surgery.  (Stop Fish Oil now)  ____ Bring C-Pap to the hospital.

## 2015-09-04 NOTE — Telephone Encounter (Signed)
-----   Message from Nori Riis, PA-C sent at 09/04/2015  8:05 AM EDT ----- Please start patient on Cipro 500 mg, one tablet twice daily for ten days.

## 2015-09-04 NOTE — Telephone Encounter (Signed)
Sarah handled this.

## 2015-09-05 DIAGNOSIS — R339 Retention of urine, unspecified: Secondary | ICD-10-CM | POA: Diagnosis not present

## 2015-09-06 ENCOUNTER — Telehealth: Payer: Self-pay

## 2015-09-06 NOTE — Telephone Encounter (Signed)
Pt called stating he has developed diarrhea and is continuing to have nose bleeds. Reinforced with pt to take probiotic as the abx can cause the diarrhea. Reinforced with pt to call PCP as he was previously seen for sinus infection. Pt voiced understanding.

## 2015-09-07 ENCOUNTER — Encounter: Payer: Self-pay | Admitting: Urology

## 2015-09-07 NOTE — Progress Notes (Addendum)
9:55 AM  09/07/2015   Anthony Gonzales [birth year] [id number]  Referring provider: Idelle Crouch, MD [facility] [place], [place] [address]  Chief Complaint  Patient presents with  . Cysto Stent Removal    HPI:  82 year old male who initially presented with asymptomatic incidental moderate right hydroureteronephrosis in 12/2014.   He is taken to the OR on 01/30/2015 at which time right ureteroscopy revealed as suspicious area within the right distal ureter with a shaggy, irregular appearance but no obvious papillary or pedunculated tumor. Brush cytology was negative for any malignancy.   Ureteral biopsy revealed some atypia favoring neoplasm but essentially was non-diagnostic.   Interval follow-up renal ultrasound on 05/02/2015 did show complete resolution of his hydronephrosis.  He was counseled to return for a second look but deferred this for ~6 months.  He eventually return to the OR on 08/20/15 at which time the right distal ureteral was highly suspicious for papillary urothelial carcinoma was was biopsied. 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  Stent remains in place.    Never smoker.     He does have baseline chronic renal insufficiency.  Cr 1.6-2.   He also has a history of recurrent nephrolithiasis although no stone seen on CT scan as of 12/2014.  He is currently on Flomax with improvement in his urinary symptoms.  Nocturia has since improved to 2x from 4x nightly.  He does also have post op urinary retention.   He return today to discuss pathology results.  No issues with the stent.      PMH: Past Medical History  Diagnosis Date  . Hypertension   . HLD (hyperlipidemia)   . BPH (benign prostatic hyperplasia)   . Glaucoma     no drops in 3 mo pressure good, pt denies glaucoma, eye  pressure has been measuring alright.  . Vertigo   . HTN (hypertension) 12/26/2014  . UTI (lower urinary tract infection) 12/26/2014  . Benign fibroma of prostate 08/23/2013  . Calculus of kidney 08/23/2013  . Hyponatremia 12/26/2014  . Migraines     history of migraines when he was younger.  . Cancer ([name])     skin  . Arthritis   . Restless leg syndrome     Surgical History: Past Surgical History  Procedure Laterality Date  . Spermatocelectomy    . Colonoscopy    . Ureteroscopy Right 01/30/2015    Procedure: URETEROSCOPY/ WITH BIOPSY AND CYTOLOGY BRUSHING;  Surgeon: [name], MD;  Location: [facility] ORS;  Service: Urology;  Laterality: Right;  . Cystoscopy w/ retrogrades Right 01/30/2015    Procedure: CYSTOSCOPY WITH RETROGRADE PYELOGRAM;  Surgeon: [name], MD;  Location: [facility] ORS;  Service: Urology;  Laterality: Right;  . Cystoscopy with stent placement Right 01/30/2015    Procedure: CYSTOSCOPY WITH STENT PLACEMENT;  Surgeon: [name], MD;  Location: [facility] ORS;  Service: Urology;  Laterality: Right;  . Ureteroscopy Right 08/20/2015    Procedure: URETEROSCOPY;  Surgeon: [name], MD;  Location: [facility] ORS;  Service: Urology;  Laterality: Right;  . Cystoscopy w/ ureteral stent placement Right 08/20/2015    Procedure: CYSTOSCOPY WITH RETROGRADE PYELOGRAM/POSSIBLE URETERAL STENT PLACEMENT/BLADDER BIOPSY;  Surgeon: [name], MD;  Location: [facility] ORS;  Service: Urology;  Laterality: Right;  . Holmium laser application N/A 05/05/3333    Procedure:  HOLMIUM LASER APPLICATION;  Surgeon: [name], MD;  Location: [facility] ORS;  Service: Urology;  Laterality: N/A;  . Tonsillectomy    . Eye surgery Bilateral     Cataract Extraction with IOL    Home Medications:    Medication List       This list is accurate as of: 08/31/15 11:59 PM.  Always use your most recent med list.               aspirin EC 81 MG tablet  Take 81 mg by mouth. At supper.      benazepril-hydrochlorthiazide 20-12.5 MG tablet  Commonly known as:  LOTENSIN HCT  Take 1 tablet by mouth daily. In am.     docusate sodium 100 MG capsule  Commonly known as:  COLACE  Take 1 capsule (100 mg total) by mouth 2 (two) times daily.     DYMISTA 137-50 MCG/ACT Susp  Generic drug:  Azelastine-Fluticasone  PLACE 1 SPRAY INTO BOTH NOSTRILS 2 (TWO) TIMES DAILY.     fenofibrate micronized 134 MG capsule  Commonly known as:  LOFIBRA  Take 134 mg by mouth daily before breakfast.     Fish Oil 1200 MG Caps  Take 1 mg by mouth 2 (two) times daily.     HYDROcodone-acetaminophen 5-325 MG tablet  Commonly known as:  NORCO/VICODIN  Take 1-2 tablets by mouth every 6 (six) hours as needed for moderate pain.     multivitamin capsule  Take 1 capsule by mouth every morning.     oxybutynin 5 MG tablet  Commonly known as:  DITROPAN  Take 1 tablet (5 mg total) by mouth every 8 (eight) hours as needed for bladder spasms.     tamsulosin 0.4 MG Caps capsule  Commonly known as:  FLOMAX  TAKE ONE CAPSULE BY MOUTH DAILY     TGT PSYLLIUM FIBER 0.52 g capsule  Generic drug:  psyllium  Take 0.52 g by mouth as needed.        Allergies:  Allergies  Allergen Reactions  . Demerol [Meperidine] Nausea And Vomiting    Other reaction(s): Unknown Other reaction(s): Unknown  . Lipitor [Atorvastatin] Swelling  . Septra [Sulfamethoxazole-Trimethoprim] Nausea And Vomiting    Per patient "this medication made me feel worse since starting this antibiotic".    . Sulfa Antibiotics Rash    Family History: Family History  Problem Relation Age of Onset  . Hypertension Mother   . Hypertension Father   . Prostate cancer Brother     Social History:  reports that he has never smoked. He has never used smokeless tobacco. He reports that he does not drink alcohol or use illicit drugs.   Physical Exam: BP 151/85 mmHg  Pulse 86  Ht 6' (1.829 m)  Wt 196 lb (88.905 kg)  BMI 26.58 kg/m2    Constitutional:  Alert and oriented, No acute distress. HEENT: [place] AT, moist mucus membranes.  Trachea midline, no masses. Cardiovascular: No clubbing, cyanosis, or edema.  RRR. Respiratory: Normal respiratory effort, no increased work of breathing. CTAB. Skin: No rashes, bruises or suspicious lesions. Neurologic: Grossly intact, no focal deficits, moving all 4 extremities. Psychiatric: Normal mood and affect.  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 These were biopsied as a result represent CIS, however, pathology is consistent with high-grade noninvasive tumor and needs to be completely resected. Also recommended instillation of mitomycin following the resection. We reviewed the risk and benefits of returning to the operating room including risk of bleeding, infection, damage to bladder, need for further procedures amongst others. All of his questions are answered.   2. Right distal ureteral cancer Recommend second look biopsy/ fulguration of any residual tumor in distal ureter.  At that time, we will exchange his stent for a Bard Optima stent which can remain in place for longer period of time. Given his age and other comorbidities, we'll likely recommend BCG with stent in place allowing the BCG to reflux up the stent.  We'll discuss further pending findings at the time of second look ureteroscopy and further biopsies.  3. History of nephrolithiasis Currently stone free.   4. Nocturia/ history of retention Continue Flomax   [name], MD  [facility] [address], [place], [place] [address] [phone]   Addendum:  Patient  caths three times daily indefinitely due to retention.  Zara Council PA-C

## 2015-09-12 ENCOUNTER — Encounter: Payer: Self-pay | Admitting: *Deleted

## 2015-09-12 ENCOUNTER — Ambulatory Visit: Payer: PPO | Admitting: Certified Registered"

## 2015-09-12 ENCOUNTER — Encounter
Admission: RE | Disposition: A | Discharge status: Home / Self Care | Payer: Self-pay | Source: Ambulatory Visit | Attending: Urology

## 2015-09-12 ENCOUNTER — Ambulatory Visit
Admission: RE | Admit: 2015-09-12 | Discharge: 2015-09-12 | Disposition: A | Discharge status: Home / Self Care | Payer: PPO | Source: Ambulatory Visit | Attending: Urology | Admitting: Urology

## 2015-09-12 DIAGNOSIS — C661 Malignant neoplasm of right ureter: Secondary | ICD-10-CM | POA: Diagnosis not present

## 2015-09-12 DIAGNOSIS — I129 Hypertensive chronic kidney disease with stage 1 through stage 4 chronic kidney disease, or unspecified chronic kidney disease: Secondary | ICD-10-CM | POA: Insufficient documentation

## 2015-09-12 DIAGNOSIS — N400 Enlarged prostate without lower urinary tract symptoms: Secondary | ICD-10-CM | POA: Diagnosis not present

## 2015-09-12 DIAGNOSIS — G2581 Restless legs syndrome: Secondary | ICD-10-CM | POA: Diagnosis not present

## 2015-09-12 DIAGNOSIS — N2889 Other specified disorders of kidney and ureter: Secondary | ICD-10-CM | POA: Insufficient documentation

## 2015-09-12 DIAGNOSIS — E785 Hyperlipidemia, unspecified: Secondary | ICD-10-CM | POA: Insufficient documentation

## 2015-09-12 DIAGNOSIS — C674 Malignant neoplasm of posterior wall of bladder: Secondary | ICD-10-CM

## 2015-09-12 DIAGNOSIS — I10 Essential (primary) hypertension: Secondary | ICD-10-CM | POA: Diagnosis not present

## 2015-09-12 DIAGNOSIS — N189 Chronic kidney disease, unspecified: Secondary | ICD-10-CM | POA: Insufficient documentation

## 2015-09-12 DIAGNOSIS — C669 Malignant neoplasm of unspecified ureter: Secondary | ICD-10-CM | POA: Diagnosis not present

## 2015-09-12 DIAGNOSIS — N201 Calculus of ureter: Secondary | ICD-10-CM | POA: Diagnosis not present

## 2015-09-12 DIAGNOSIS — H409 Unspecified glaucoma: Secondary | ICD-10-CM | POA: Insufficient documentation

## 2015-09-12 DIAGNOSIS — M1990 Unspecified osteoarthritis, unspecified site: Secondary | ICD-10-CM | POA: Insufficient documentation

## 2015-09-12 DIAGNOSIS — Z85828 Personal history of other malignant neoplasm of skin: Secondary | ICD-10-CM | POA: Insufficient documentation

## 2015-09-12 DIAGNOSIS — Z87442 Personal history of urinary calculi: Secondary | ICD-10-CM | POA: Insufficient documentation

## 2015-09-12 DIAGNOSIS — Z8551 Personal history of malignant neoplasm of bladder: Secondary | ICD-10-CM | POA: Diagnosis not present

## 2015-09-12 DIAGNOSIS — C679 Malignant neoplasm of bladder, unspecified: Secondary | ICD-10-CM | POA: Diagnosis not present

## 2015-09-12 HISTORY — PX: CYSTOSCOPY WITH BIOPSY: SHX5122

## 2015-09-12 HISTORY — PX: TRANSURETHRAL RESECTION OF BLADDER TUMOR WITH MITOMYCIN-C: SHX6459

## 2015-09-12 HISTORY — PX: URETEROSCOPY: SHX842

## 2015-09-12 HISTORY — PX: CYSTOSCOPY W/ URETERAL STENT PLACEMENT: SHX1429

## 2015-09-12 SURGERY — TRANSURETHRAL RESECTION OF BLADDER TUMOR WITH MITOMYCIN-C
Anesthesia: General | Laterality: Right

## 2015-09-12 MED ORDER — FAMOTIDINE 20 MG PO TABS
ORAL_TABLET | ORAL | Status: AC
  Filled 2015-09-12: qty 1

## 2015-09-12 MED ORDER — DOCUSATE SODIUM 100 MG PO CAPS: 100.0000 mg | ORAL_CAPSULE | Freq: Two times a day (BID) | ORAL | Status: DC

## 2015-09-12 MED ORDER — FENTANYL CITRATE (PF) 100 MCG/2ML IJ SOLN
INTRAMUSCULAR | Status: DC | PRN
  Administered 2015-09-12 (×6): 25 ug via INTRAVENOUS

## 2015-09-12 MED ORDER — OXYBUTYNIN CHLORIDE 5 MG PO TABS: 5.0000 mg | ORAL_TABLET | Freq: Three times a day (TID) | ORAL | Status: DC | PRN

## 2015-09-12 MED ORDER — FAMOTIDINE 20 MG PO TABS
20.0000 mg | ORAL_TABLET | Freq: Once | ORAL | Status: AC
  Administered 2015-09-12: 20 mg via ORAL

## 2015-09-12 MED ORDER — FENTANYL CITRATE (PF) 100 MCG/2ML IJ SOLN: 25.0000 ug | INTRAMUSCULAR | Status: DC | PRN

## 2015-09-12 MED ORDER — PROPOFOL 10 MG/ML IV BOLUS
INTRAVENOUS | Status: DC | PRN
  Administered 2015-09-12: 130 mg via INTRAVENOUS
  Administered 2015-09-12: 20 mg via INTRAVENOUS
  Administered 2015-09-12: 50 mg via INTRAVENOUS

## 2015-09-12 MED ORDER — LIDOCAINE HCL (CARDIAC) 20 MG/ML IV SOLN
INTRAVENOUS | Status: DC | PRN
  Administered 2015-09-12: 40 mg via INTRAVENOUS

## 2015-09-12 MED ORDER — CEFAZOLIN SODIUM-DEXTROSE 2-4 GM/100ML-% IV SOLN
2.0000 g | Freq: Once | INTRAVENOUS | Status: AC
  Administered 2015-09-12: 2 g via INTRAVENOUS

## 2015-09-12 MED ORDER — EPHEDRINE SULFATE 50 MG/ML IJ SOLN
INTRAMUSCULAR | Status: DC | PRN
  Administered 2015-09-12: 10 mg via INTRAVENOUS

## 2015-09-12 MED ORDER — LACTATED RINGERS IV SOLN
INTRAVENOUS | Status: DC
  Administered 2015-09-12: 11:00:00 via INTRAVENOUS

## 2015-09-12 MED ORDER — MITOMYCIN CHEMO FOR BLADDER INSTILLATION 40 MG
40.0000 mg | Freq: Once | INTRAVENOUS | Status: DC
  Filled 2015-09-12: qty 40

## 2015-09-12 MED ORDER — IOTHALAMATE MEGLUMINE 43 % IV SOLN
INTRAVENOUS | Status: DC | PRN
  Administered 2015-09-12: 20 mL

## 2015-09-12 MED ORDER — CEFAZOLIN SODIUM-DEXTROSE 2-4 GM/100ML-% IV SOLN
INTRAVENOUS | Status: AC
  Administered 2015-09-12: 2 g via INTRAVENOUS
  Filled 2015-09-12: qty 100

## 2015-09-12 MED ORDER — HYDROCODONE-ACETAMINOPHEN 5-325 MG PO TABS: 1.0000 | ORAL_TABLET | Freq: Four times a day (QID) | ORAL | Status: DC | PRN

## 2015-09-12 MED ORDER — ONDANSETRON HCL 4 MG/2ML IJ SOLN: 4.0000 mg | Freq: Once | INTRAMUSCULAR | Status: DC | PRN

## 2015-09-12 SURGICAL SUPPLY — 48 items
ADAPTER SCOPE UROLOK II (MISCELLANEOUS) ×4 IMPLANT
BAG DRAIN CYSTO-URO LG1000N (MISCELLANEOUS) ×4 IMPLANT
BAG URO DRAIN 2000ML W/SPOUT (MISCELLANEOUS) ×4 IMPLANT
CATH FOL 2WAY LX 16X5 (CATHETERS) IMPLANT
CATH FOLEY 2WAY  5CC 16FR (CATHETERS) ×2
CATH URETL 5X70 OPEN END (CATHETERS) ×4 IMPLANT
CATH URTH 16FR FL 2W BLN LF (CATHETERS) ×2 IMPLANT
CNTNR SPEC 2.5X3XGRAD LEK (MISCELLANEOUS) ×2
CONRAY 43 FOR UROLOGY 50M (MISCELLANEOUS) ×4 IMPLANT
CONT SPEC 4OZ STER OR WHT (MISCELLANEOUS) ×2
CONTAINER SPEC 2.5X3XGRAD LEK (MISCELLANEOUS) ×2 IMPLANT
CORD URO TURP 10FT (MISCELLANEOUS) ×4 IMPLANT
DRAPE UTILITY 15X26 TOWEL STRL (DRAPES) ×4 IMPLANT
ELECT LOOP 22F BIPOLAR SML (ELECTROSURGICAL)
ELECT REM PT RETURN 9FT ADLT (ELECTROSURGICAL) ×4
ELECTRODE LOOP 22F BIPOLAR SML (ELECTROSURGICAL) IMPLANT
ELECTRODE REM PT RTRN 9FT ADLT (ELECTROSURGICAL) ×2 IMPLANT
FORCEPS BIOP PIRANHA Y (CUTTING FORCEPS) ×4 IMPLANT
GLOVE BIO SURGEON STRL SZ 6.5 (GLOVE) ×3 IMPLANT
GLOVE BIO SURGEON STRL SZ7 (GLOVE) ×8 IMPLANT
GLOVE BIO SURGEONS STRL SZ 6.5 (GLOVE) ×1
GOWN STRL REUS W/ TWL LRG LVL3 (GOWN DISPOSABLE) ×4 IMPLANT
GOWN STRL REUS W/ TWL LRG LVL4 (GOWN DISPOSABLE) ×4 IMPLANT
GOWN STRL REUS W/TWL LRG LVL3 (GOWN DISPOSABLE) ×4
GOWN STRL REUS W/TWL LRG LVL4 (GOWN DISPOSABLE) ×4
HOLDER FOLEY CATH W/STRAP (MISCELLANEOUS) IMPLANT
INTRODUCER DILATOR DOUBLE (INTRODUCER) ×4 IMPLANT
KIT RM TURNOVER CYSTO AR (KITS) ×4 IMPLANT
LOOP CUT BIPOLAR 24F LRG (ELECTROSURGICAL) IMPLANT
PACK CYSTO AR (MISCELLANEOUS) ×4 IMPLANT
PLUG CATH AND CAP STER (CATHETERS) ×4 IMPLANT
PREP PVP WINGED SPONGE (MISCELLANEOUS) ×4 IMPLANT
PUMP SINGLE ACTION SAP (PUMP) ×4 IMPLANT
SENSORWIRE 0.038 NOT ANGLED (WIRE) ×8
SET CYSTO W/LG BORE CLAMP LF (SET/KITS/TRAYS/PACK) ×4 IMPLANT
SET IRRIG Y TYPE TUR BLADDER L (SET/KITS/TRAYS/PACK) ×4 IMPLANT
SET IRRIGATING DISP (SET/KITS/TRAYS/PACK) ×4 IMPLANT
SHEATH URETERAL 12FRX35CM (MISCELLANEOUS) ×4 IMPLANT
SOL .9 NS 3000ML IRR  AL (IV SOLUTION) ×4
SOL .9 NS 3000ML IRR UROMATIC (IV SOLUTION) ×4 IMPLANT
STENT URET 6FRX24 CONTOUR (STENTS) IMPLANT
STENT URET 6FRX26 CONTOUR (STENTS) IMPLANT
STENT URO INLAY 6FRX26CM (STENTS) ×4 IMPLANT
SURGILUBE 2OZ TUBE FLIPTOP (MISCELLANEOUS) ×4 IMPLANT
SYRINGE IRR TOOMEY STRL 70CC (SYRINGE) ×4 IMPLANT
WATER STERILE IRR 1000ML POUR (IV SOLUTION) ×4 IMPLANT
WATER STERILE IRR 3000ML UROMA (IV SOLUTION) ×4 IMPLANT
WIRE SENSOR 0.038 NOT ANGLED (WIRE) ×4 IMPLANT

## 2015-09-12 NOTE — Discharge Instructions (Signed)
Foley Catheter Care, Adult A Foley catheter is a soft, flexible tube. This tube is placed into your bladder to drain pee (urine). If you go home with this catheter in place, follow the instructions below. TAKING CARE OF THE CATHETER 1. Wash your hands with soap and water. 2. Put soap and water on a clean washcloth.  Clean the skin where the tube goes into your body.  Clean away from the tube site.  Never wipe toward the tube.  Clean the area using a circular motion.  Remove all the soap. Pat the area dry with a clean towel. For males, reposition the skin that covers the end of the penis (foreskin). 3. Attach the tube to your leg with tape or a leg strap. Do not stretch the tube tight. If you are using tape, remove any stickiness left behind by past tape you used. 4. Keep the drainage bag below your hips. Keep it off the floor. 5. Check your tube during the day. Make sure it is working and draining. Make sure the tube does not curl, twist, or bend. 6. Do not pull on the tube or try to take it out. TAKING CARE OF THE DRAINAGE BAGS You will have a large overnight drainage bag and a small leg bag. You may wear the overnight bag any time. Never wear the small bag at night. Follow the directions below. Emptying the Drainage Bag Empty your drainage bag when it is  - full or at least 2-3 times a day. 1. Wash your hands with soap and water. 2. Keep the drainage bag below your hips. 3. Hold the dirty bag over the toilet or clean container. 4. Open the pour spout at the bottom of the bag. Empty the pee into the toilet or container. Do not let the pour spout touch anything. 5. Clean the pour spout with a gauze pad or cotton ball that has rubbing alcohol on it. 6. Close the pour spout. 7. Attach the bag to your leg with tape or a leg strap. 8. Wash your hands well. Changing the Drainage Bag Change your bag once a month or sooner if it starts to smell or look dirty.  1. Wash your hands with soap  and water. 2. Pinch the rubber tube so that pee does not spill out. 3. Disconnect the catheter tube from the drainage tube at the connection valve. Do not let the tubes touch anything. 4. Clean the end of the catheter tube with an alcohol wipe. Clean the end of a the drainage tube with a different alcohol wipe. 5. Connect the catheter tube to the drainage tube of the clean drainage bag. 6. Attach the new bag to the leg with tape or a leg strap. Avoid attaching the new bag too tightly. 7. Wash your hands well. Cleaning the Drainage Bag 1. Wash your hands with soap and water. 2. Wash the bag in warm, soapy water. 3. Rinse the bag with warm water. 4. Fill the bag with a mixture of white vinegar and water (1 cup vinegar to 1 quart warm water [.2 liter vinegar to 1 liter warm water]). Close the bag and soak it for 30 minutes in the solution. 5. Rinse the bag with warm water. 6. Hang the bag to dry with the pour spout open and hanging downward. 7. Store the clean bag (once it is dry) in a clean plastic bag. 8. Wash your hands well. PREVENT INFECTION  Wash your hands before and after touching your tube.  Take showers every day. Wash the skin where the tube enters your body. Do not take baths. Replace wet leg straps with dry ones, if this applies.  Do not use powders, sprays, or lotions on the genital area. Only use creams, lotions, or ointments as told by your doctor.  For females, wipe from front to back after going to the bathroom.  Drink enough fluids to keep your pee clear or pale yellow unless you are told not to have too much fluid (fluid restriction).  Do not let the drainage bag or tubing touch or lie on the floor.  Wear cotton underwear to keep the area dry. GET HELP IF:  Your pee is cloudy or smells unusually bad.  Your tube becomes clogged.  You are not draining pee into the bag or your bladder feels full.  Your tube starts to leak. GET HELP RIGHT AWAY IF:  You have  pain, puffiness (swelling), redness, or yellowish-white fluid (pus) where the tube enters the body.  You have pain in the belly (abdomen), legs, lower back, or bladder.  You have a fever.  You see blood fill the tube, or your pee is pink or red.  You feel sick to your stomach (nauseous), throw up (vomit), or have chills.  Your tube gets pulled out. MAKE SURE YOU:   Understand these instructions.  Will watch your condition.  Will get help right away if you are not doing well or get worse.   This information is not intended to replace advice given to you by your health care provider. Make sure you discuss any questions you have with your health care provider.   Document Released: 06/21/2012 Document Revised: 03/17/2014 Document Reviewed: 06/21/2012 Elsevier Interactive Patient Education 2016 [place]   1) The drugs that you were given will stay in your system until tomorrow so for the next 24 hours you should not:  A) Drive an automobile B) Make any legal decisions C) Drink any alcoholic beverage   2) You may resume regular meals tomorrow.  Today it is better to start with liquids and gradually work up to solid foods.  You may eat anything you prefer, but it is better to start with liquids, then soup and crackers, and gradually work up to solid foods.   3) Please notify your doctor immediately if you have any unusual bleeding, trouble breathing, redness and pain at the surgery site, drainage, fever, or pain not relieved by medication.    4) Additional Instructions:        Please contact your physician with any problems or Same Day Surgery at [phone], Monday through Friday 6 am to 4 pm, or [place] at [facility] number at [phone].

## 2015-09-12 NOTE — H&P (View-Only) (Signed)
9:55 AM  09/07/2015   [name] 1931-11-02 [id number]  Referring provider: Idelle Crouch, MD [facility] [place], [place] [address]  Chief Complaint  Patient presents with  . Cysto Stent Removal    HPI:  82 year old male who initially presented with asymptomatic incidental moderate right hydroureteronephrosis in 12/2014.   He is taken to the OR on 01/30/2015 at which time right ureteroscopy revealed as suspicious area within the right distal ureter with a shaggy, irregular appearance but no obvious papillary or pedunculated tumor. Brush cytology was negative for any malignancy.   Ureteral biopsy revealed some atypia favoring neoplasm but essentially was non-diagnostic.   Interval follow-up renal ultrasound on 05/02/2015 did show complete resolution of his hydronephrosis.  He was counseled to return for a second look but deferred this for ~6 months.  He eventually return to the OR on 08/20/15 at which time the right distal ureteral was highly suspicious for papillary urothelial carcinoma was was biopsied. 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  Stent remains in place.    Never smoker.     He does have baseline chronic renal insufficiency.  Cr 1.6-2.   He also has a history of recurrent nephrolithiasis although no stone seen on CT scan as of 12/2014.  He is currently on Flomax with improvement in his urinary symptoms.  Nocturia has since improved to 2x from 4x nightly.  He does also have post op urinary retention.   He return today to discuss pathology results.  No issues with the stent.      PMH: Past Medical History  Diagnosis Date  . Hypertension   . HLD (hyperlipidemia)   . BPH (benign prostatic hyperplasia)   . Glaucoma     no drops in 3 mo pressure good, pt denies glaucoma, eye  pressure has been measuring alright.  . Vertigo   . HTN (hypertension) 12/26/2014  . UTI (lower urinary tract infection) 12/26/2014  . Benign fibroma of prostate 08/23/2013  . Calculus of kidney 08/23/2013  . Hyponatremia 12/26/2014  . Migraines     history of migraines when he was younger.  . Cancer ([place])     skin  . Arthritis   . Restless leg syndrome     Surgical History: Past Surgical History  Procedure Laterality Date  . Spermatocelectomy    . Colonoscopy    . Ureteroscopy Right 01/30/2015    Procedure: URETEROSCOPY/ WITH BIOPSY AND CYTOLOGY BRUSHING;  Surgeon: [name], MD;  Location: [facility] ORS;  Service: Urology;  Laterality: Right;  . Cystoscopy w/ retrogrades Right 01/30/2015    Procedure: CYSTOSCOPY WITH RETROGRADE PYELOGRAM;  Surgeon: [name], MD;  Location: [facility];  Service: Urology;  Laterality: Right;  . Cystoscopy with stent placement Right 01/30/2015    Procedure: CYSTOSCOPY WITH STENT PLACEMENT;  Surgeon: [name], MD;  Location: [facility];  Service: Urology;  Laterality: Right;  . Ureteroscopy Right 08/20/2015    Procedure: URETEROSCOPY;  Surgeon: [name], MD;  Location: [facility];  Service: Urology;  Laterality: Right;  . Cystoscopy w/ ureteral stent placement Right 08/20/2015    Procedure: CYSTOSCOPY WITH RETROGRADE PYELOGRAM/POSSIBLE URETERAL STENT PLACEMENT/BLADDER BIOPSY;  Surgeon: [name], MD;  Location: [facility];  Service: Urology;  Laterality: Right;  . Holmium laser application N/A 8/84/1660    Procedure:  HOLMIUM LASER APPLICATION;  Surgeon: [name], MD;  Location: [facility];  Service: Urology;  Laterality: N/A;  . Tonsillectomy    . Eye surgery Bilateral     Cataract Extraction with IOL    Home Medications:    Medication List       This list is accurate as of: 08/31/15 11:59 PM.  Always use your most recent med list.               aspirin EC 81 MG tablet  Take 81 mg by mouth. At supper.      benazepril-hydrochlorthiazide 20-12.5 MG tablet  Commonly known as:  LOTENSIN HCT  Take 1 tablet by mouth daily. In am.     docusate sodium 100 MG capsule  Commonly known as:  COLACE  Take 1 capsule (100 mg total) by mouth 2 (two) times daily.     DYMISTA 137-50 MCG/ACT Susp  Generic drug:  Azelastine-Fluticasone  PLACE 1 SPRAY INTO BOTH NOSTRILS 2 (TWO) TIMES DAILY.     fenofibrate micronized 134 MG capsule  Commonly known as:  LOFIBRA  Take 134 mg by mouth daily before breakfast.     Fish Oil 1200 MG Caps  Take 1 mg by mouth 2 (two) times daily.     HYDROcodone-acetaminophen 5-325 MG tablet  Commonly known as:  NORCO/VICODIN  Take 1-2 tablets by mouth every 6 (six) hours as needed for moderate pain.     multivitamin capsule  Take 1 capsule by mouth every morning.     oxybutynin 5 MG tablet  Commonly known as:  DITROPAN  Take 1 tablet (5 mg total) by mouth every 8 (eight) hours as needed for bladder spasms.     tamsulosin 0.4 MG Caps capsule  Commonly known as:  FLOMAX  TAKE ONE CAPSULE BY MOUTH DAILY     TGT PSYLLIUM FIBER 0.52 g capsule  Generic drug:  psyllium  Take 0.52 g by mouth as needed.        Allergies:  Allergies  Allergen Reactions  . Demerol [Meperidine] Nausea And Vomiting    Other reaction(s): Unknown Other reaction(s): Unknown  . Lipitor [Atorvastatin] Swelling  . Septra [Sulfamethoxazole-Trimethoprim] Nausea And Vomiting    Per patient "this medication made me feel worse since starting this antibiotic".    . Sulfa Antibiotics Rash    Family History: Family History  Problem Relation Age of Onset  . Hypertension Mother   . Hypertension Father   . Prostate cancer Brother     Social History:  reports that he has never smoked. He has never used smokeless tobacco. He reports that he does not drink alcohol or use illicit drugs.   Physical Exam: BP 151/85 mmHg  Pulse 86  Ht 6' (1.829 m)  Wt 196 lb (88.905 kg)  BMI 26.58 kg/m2    Constitutional:  Alert and oriented, No acute distress. HEENT: [place] AT, moist mucus membranes.  Trachea midline, no masses. Cardiovascular: No clubbing, cyanosis, or edema.  RRR. Respiratory: Normal respiratory effort, no increased work of breathing. CTAB. Skin: No rashes, bruises or suspicious lesions. Neurologic: Grossly intact, no focal deficits, moving all 4 extremities. Psychiatric: Normal mood and affect.  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 These were biopsied as a result represent CIS, however, pathology is consistent with high-grade noninvasive tumor and needs to be completely resected. Also recommended instillation of mitomycin following the resection. We reviewed the risk and benefits of returning to the operating room including risk of bleeding, infection, damage to bladder, need for further procedures amongst others. All of his questions are answered.   2. Right distal ureteral cancer Recommend second look biopsy/ fulguration of any residual tumor in distal ureter.  At that time, we will exchange his stent for a Bard Optima stent which can remain in place for longer period of time. Given his age and other comorbidities, we'll likely recommend BCG with stent in place allowing the BCG to reflux up the stent.  We'll discuss further pending findings at the time of second look ureteroscopy and further biopsies.  3. History of nephrolithiasis Currently stone free.   4. Nocturia/ history of retention Continue Flomax   [name], MD  [facility] [address] [place], [place] [address] [phone]   Addendum:  Patient  caths three times daily indefinitely due to retention.  Zara Council PA-C

## 2015-09-12 NOTE — Anesthesia Preprocedure Evaluation (Signed)
Anesthesia Evaluation  Patient identified by MRN, date of birth, ID band Patient awake    Reviewed: Allergy & Precautions, NPO status , Patient's Chart, lab work & pertinent test results  Airway Mallampati: III       Dental  (+) Teeth Intact   Pulmonary neg pulmonary ROS,    breath sounds clear to auscultation       Cardiovascular Exercise Tolerance: Poor hypertension, Pt. on medications  Rhythm:Regular     Neuro/Psych  Headaches,    GI/Hepatic negative GI ROS, Neg liver ROS,   Endo/Other  negative endocrine ROS  Renal/GU negative Renal ROS     Musculoskeletal   Abdominal Normal abdominal exam  (+)   Peds  Hematology negative hematology ROS (+)   Anesthesia Other Findings   Reproductive/Obstetrics                             Anesthesia Physical Anesthesia Plan  ASA: III  Anesthesia Plan: General   Post-op Pain Management:    Induction: Intravenous  Airway Management Planned: LMA  Additional Equipment:   Intra-op Plan:   Post-operative Plan: Extubation in OR  Informed Consent: I have reviewed the patients History and Physical, chart, labs and discussed the procedure including the risks, benefits and alternatives for the proposed anesthesia with the patient or authorized representative who has indicated his/her understanding and acceptance.     Plan Discussed with: CRNA  Anesthesia Plan Comments:         Anesthesia Quick Evaluation

## 2015-09-12 NOTE — Interval H&P Note (Signed)
History and Physical Interval Note:  09/12/2015 2:21 PM  Anthony Gonzales  has presented today for surgery, with the diagnosis of [place]  The various methods of treatment have been discussed with the patient and family. After consideration of risks, benefits and other options for treatment, the patient has consented to  Procedure(s): TRANSURETHRAL RESECTION OF BLADDER TUMOR WITH MITOMYCIN-C (N/A) URETEROSCOPY (Right) CYSTOSCOPY WITH BIOPSY (Right) CYSTOSCOPY WITH STENT REPLACEMENT (Right) as a surgical intervention .  The patient's history has been reviewed, patient examined, no change in status, stable for surgery.  I have reviewed the patient's chart and labs.  Questions were answered to the patient's satisfaction.     [name]

## 2015-09-12 NOTE — Transfer of Care (Signed)
Immediate Anesthesia Transfer of Care Note  Patient: VOYD GROFT  Procedure(s) Performed: Procedure(s): TRANSURETHRAL RESECTION OF BLADDER TUMOR WITH MITOMYCIN-C (N/A) URETEROSCOPY (Right) CYSTOSCOPY WITH BIOPSY (Right) CYSTOSCOPY WITH STENT REPLACEMENT (Right)  Patient Location: PACU  Anesthesia Type:General  Level of Consciousness: sedated  Airway & Oxygen Therapy: Patient Spontanous Breathing and Patient connected to face mask oxygen  Post-op Assessment: Report given to RN  Post vital signs: Reviewed and stable  Last Vitals:  Filed Vitals:   09/12/15 1035 09/12/15 1534  BP: 157/97 151/78  Pulse: 101 104  Temp: 36.7 C 35.9 C  Resp: 18 16    Last Pain: There were no vitals filed for this visit.       Complications: No apparent anesthesia complications

## 2015-09-12 NOTE — Anesthesia Procedure Notes (Signed)
Procedure Name: LMA Insertion Date/Time: 09/12/2015 2:39 PM Performed by: Dionne Bucy Pre-anesthesia Checklist: Patient identified, Patient being monitored, Timeout performed, Emergency Drugs available and Suction available Patient Re-evaluated:Patient Re-evaluated prior to inductionOxygen Delivery Method: Circle system utilized Preoxygenation: Pre-oxygenation with 100% oxygen Intubation Type: IV induction Ventilation: Mask ventilation without difficulty LMA: LMA inserted LMA Size: 5.0 Tube type: Oral Number of attempts: 1 Placement Confirmation: positive ETCO2 Tube secured with: Tape Dental Injury: Teeth and Oropharynx as per pre-operative assessment

## 2015-09-12 NOTE — Op Note (Signed)
Date of procedure: 09/12/2015  Preoperative diagnosis:  1. Right distal ureteral malignancy 2. Bladder cancer on posterior bladder wall and right lateral wall   Postoperative diagnosis:  1. Same as above   Procedure: 1. Cystoscopy 2. Right ureteroscopy 3. Right ureteral biopsy 4. Right ureteral stent exchange 5. TURBT, small  Surgeon: [name], MD  Anesthesia: General  Complications: None  Intraoperative findings: Right distal ureteroscopy fairly unremarkable with some mild edematous changes but no obvious residual tumor. Patchy erythema on the posterior wall in 2 discrete areas along with a discrete area on the right lateral wall measuring approximate 1 cm each.  EBL: Minimal  Specimens: Right distal ureteral biopsy, bladder tumor  Drains: 16 French Foley catheter  Indication: Anthony Gonzales is a 82 y.o. patient with right distal ureteral mass biopsied consistent with at least CIS as well as multiple areas of high-grade TA TCC within the bladder) was previous biopsy. He returns today for a second look right ureteroscopy with possible further biopsies as well as complete resection of his bladder tumor.  After reviewing the management options for treatment, he elected to proceed with the above surgical procedure(s). We have discussed the potential benefits and risks of the procedure, side effects of the proposed treatment, the likelihood of the patient achieving the goals of the procedure, and any potential problems that might occur during the procedure or recuperation. Informed consent has been obtained.  Description of procedure:  The patient was taken to the operating room and general anesthesia was induced.  The patient was placed in the dorsal lithotomy position, prepped and draped in the usual sterile fashion, and preoperative antibiotics were administered. A preoperative time-out was performed.   A rigid 21 French cystoscope was advanced per urethra into the  bladder. Attention was turned to the right ureteral orifice from which a ureteral stent was seen emanating. The distal coil of the stent was grasped and brought to level of the urethral meatus. The stent was then cannulated using a sensor wire up to level kidney and the wire was withdrawn. The wire was snapped in place as a safety wire. A semirigid ureteroscope was then used within the distal ureter to reassess the distal ureter. This revealed some very mild edematous changes just within the UO but otherwise no obvious residual viable tumor. Proximal to this, the ureter was completely normal. Approximate 4 microscopic biopsies using the parotid biopsy forceps were used just at the distal ureter to ensure that the tumor is completely resected in for further staging purposes. The safety wire was then backloaded over a rigid cystoscope and a 6 x 26 French Bard Optima ureteral stent was advanced over the wire to level of the renal pelvis per the wire was partially drawn until full coil was noted within the renal pelvis. Wire was then fully withdrawn and a full coil was noted within the bladder. The scope was then exchanged for a 25 French resectoscope which did require male sounds dilate the fossa navicularis prior to placement. Resect scope loop was then used to resect 3 erythematous patches measuring approximately 1.5 cm each 2 on the posterior wall 1 near the dome and one on the right lateral wall. Each of these areas appeared to be visibly consistent with CIS but were biopsy-proven to be high-grade TA TCC. As such, the loop was used to peel off the mucosa overlying these areas. On the posterior superior most resection, I did resect down to a very tiny piece of fat as  his bladder was quite thin. Each of these biopsy sites were then copiously fulgurated to ensure that all of the viable tumor was obliterated and adequate hemostasis was achieved. Given the fairly deep resection, I did defer mitomycin today. The bladder  chips were then evacuated out of the bladder and sent off as bladder tumor. A 16 French Foley catheter was in place using 10 cc of water within the balloon. The patient was then reversed from anesthesia, repositioned supine position, and taken to the PACU in stale condition.  Plan: Patient will follow-up in 1 week for catheter removal. We will discuss how to proceed with his bladder and ureteral tumors, likely a good candidate for BCG therapy with reflux up the stent.  [name], M.D.

## 2015-09-13 ENCOUNTER — Encounter: Payer: Self-pay | Admitting: Urology

## 2015-09-13 NOTE — Anesthesia Postprocedure Evaluation (Signed)
Anesthesia Post Note  Patient: Anthony Gonzales  Procedure(s) Performed: Procedure(s) (LRB): TRANSURETHRAL RESECTION OF BLADDER TUMOR  (N/A) URETEROSCOPY (Right) CYSTOSCOPY WITH BLADDER AND URETERAL BIOPSY (Right) CYSTOSCOPY WITH STENT REPLACEMENT (Right)  Patient location during evaluation: PACU Anesthesia Type: General Level of consciousness: awake and alert Pain management: pain level controlled Vital Signs Assessment: post-procedure vital signs reviewed and stable Respiratory status: spontaneous breathing, nonlabored ventilation, respiratory function stable and patient connected to nasal cannula oxygen Cardiovascular status: blood pressure returned to baseline and stable Postop Assessment: no signs of nausea or vomiting Anesthetic complications: no    Last Vitals:  Filed Vitals:   09/12/15 1632 09/12/15 1706  BP: 168/95 150/85  Pulse: 89 82  Temp: 36.3 C   Resp: 16 16    Last Pain:  Filed Vitals:   09/12/15 1707  PainSc: 0-No pain                 Martha Clan

## 2015-09-14 LAB — SURGICAL PATHOLOGY

## 2015-09-19 ENCOUNTER — Ambulatory Visit (INDEPENDENT_AMBULATORY_CARE_PROVIDER_SITE_OTHER): Payer: PPO | Admitting: Urology

## 2015-09-19 ENCOUNTER — Encounter: Payer: Self-pay | Admitting: Urology

## 2015-09-19 VITALS — BP 138/77 | HR 75 | Ht 72.0 in | Wt 188.0 lb

## 2015-09-19 DIAGNOSIS — Z87448 Personal history of other diseases of urinary system: Secondary | ICD-10-CM | POA: Diagnosis not present

## 2015-09-19 DIAGNOSIS — C674 Malignant neoplasm of posterior wall of bladder: Secondary | ICD-10-CM | POA: Diagnosis not present

## 2015-09-19 DIAGNOSIS — C661 Malignant neoplasm of right ureter: Secondary | ICD-10-CM | POA: Diagnosis not present

## 2015-09-19 DIAGNOSIS — Z87898 Personal history of other specified conditions: Secondary | ICD-10-CM

## 2015-09-19 LAB — URINALYSIS, COMPLETE
BILIRUBIN UA: NEGATIVE
GLUCOSE, UA: NEGATIVE
KETONES UA: NEGATIVE
Leukocytes, UA: NEGATIVE
NITRITE UA: NEGATIVE
PROTEIN UA: NEGATIVE
RBC UA: NEGATIVE
SPEC GRAV UA: 1.01 (ref 1.005–1.030)
UUROB: 0.2 mg/dL (ref 0.2–1.0)
pH, UA: 6 (ref 5.0–7.5)

## 2015-09-19 LAB — CULTURE, URINE COMPREHENSIVE

## 2015-09-19 LAB — MICROSCOPIC EXAMINATION
Bacteria, UA: NONE SEEN
Epithelial Cells (non renal): NONE SEEN /hpf (ref 0–10)

## 2015-09-19 NOTE — Progress Notes (Signed)
8:05 PM  09/19/2015   Anthony Gonzales 1931/04/26 [id number]  Referring provider: Idelle Crouch, MD [place] [facility] [place], [place] [address]  Chief Complaint  Patient presents with  . Routine Post Op    1wk post  . Results    HPI:  82 year old male who initially presented with asymptomatic incidental moderate right hydroureteronephrosis in 12/2014.   He is taken to the OR on 01/30/2015 at which time right ureteroscopy revealed as suspicious area within the right distal ureter with a shaggy, irregular appearance but no obvious papillary or pedunculated tumor. Brush cytology was negative for any malignancy.   Ureteral biopsy revealed some atypia favoring neoplasm but essentially was non-diagnostic.   Interval follow-up renal ultrasound on 05/02/2015 did show complete resolution of his hydronephrosis.  He was counseled to return for a second look but deferred this for ~6 months.  He eventually return to the OR on 08/20/15 at which time the right distal ureteral was highly suspicious for papillary urothelial carcinoma was was biopsied. 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   Most recently, he returned to the operating room on 09/12/2015 for a second look ureteroscopy/biopsy and TURBT. Given the depth of resection, mitomycin was not administered. Foley catheter remained in place for 1 week. His catheter was removed today. Pathology shows no residual tumor in the bladder or the distal ureter. A Bard Optima stent was replaced at the time of the procedure.  Postoperative issues. He's tolerated the catheter well. He is anxious to have it removed today.  Never smoker.     He does have baseline chronic renal insufficiency.  Cr 1.6-2.   He also has a history of recurrent nephrolithiasis although no stone seen on CT  scan as of 12/2014.  He is currently on Flomax with improvement in his urinary symptoms.  He has a history of post op urinary retention and knows how to CIC as needed.   PMH: Past Medical History  Diagnosis Date  . Hypertension   . HLD (hyperlipidemia)   . BPH (benign prostatic hyperplasia)   . Glaucoma     no drops in 3 mo pressure good, pt denies glaucoma, eye pressure has been measuring alright.  . Vertigo   . HTN (hypertension) 12/26/2014  . UTI (lower urinary tract infection) 12/26/2014  . Benign fibroma of prostate 08/23/2013  . Calculus of kidney 08/23/2013  . Hyponatremia 12/26/2014  . Migraines     history of migraines when he was younger.  . Cancer ([place])     skin  . Arthritis   . Restless leg syndrome     Surgical History: Past Surgical History  Procedure Laterality Date  . Spermatocelectomy    . Colonoscopy    . Ureteroscopy Right 01/30/2015    Procedure: URETEROSCOPY/ WITH BIOPSY AND CYTOLOGY BRUSHING;  Surgeon: [name], MD;  Location: [facility];  Service: Urology;  Laterality: Right;  . Cystoscopy w/ retrogrades Right 01/30/2015    Procedure: CYSTOSCOPY WITH RETROGRADE PYELOGRAM;  Surgeon: [name], MD;  Location: [facility];  Service: Urology;  Laterality: Right;  . Cystoscopy with stent placement Right 01/30/2015    Procedure: CYSTOSCOPY WITH STENT PLACEMENT;  Surgeon: [name], MD;  Location: [facility];  Service: Urology;  Laterality: Right;  . Ureteroscopy Right 08/20/2015    Procedure: URETEROSCOPY;  Surgeon: [name], MD;  Location: [facility];  Service: Urology;  Laterality: Right;  . Cystoscopy w/ ureteral stent placement Right 08/20/2015    Procedure: CYSTOSCOPY WITH RETROGRADE PYELOGRAM/POSSIBLE URETERAL STENT PLACEMENT/BLADDER BIOPSY;  Surgeon: [name], MD;  Location: [facility];  Service: Urology;  Laterality: Right;  . Holmium laser application N/A 3/38/2505    Procedure:  HOLMIUM LASER APPLICATION;  Surgeon: [name], MD;   Location: [facility];  Service: Urology;  Laterality: N/A;  . Tonsillectomy    . Eye surgery Bilateral     Cataract Extraction with IOL  . Transurethral resection of bladder tumor with mitomycin-c N/A 09/12/2015    Procedure: TRANSURETHRAL RESECTION OF BLADDER TUMOR ;  Surgeon: [name], MD;  Location: [facility];  Service: Urology;  Laterality: N/A;  . Ureteroscopy Right 09/12/2015    Procedure: URETEROSCOPY;  Surgeon: [name], MD;  Location: [facility];  Service: Urology;  Laterality: Right;  . Cystoscopy with biopsy Right 09/12/2015    Procedure: CYSTOSCOPY WITH BLADDER AND URETERAL BIOPSY;  Surgeon: [name], MD;  Location: [facility];  Service: Urology;  Laterality: Right;  . Cystoscopy w/ ureteral stent placement Right 09/12/2015    Procedure: CYSTOSCOPY WITH STENT REPLACEMENT;  Surgeon: [name], MD;  Location: [facility];  Service: Urology;  Laterality: Right;    Home Medications:    Medication List       This list is accurate as of: 09/19/15  8:05 PM.  Always use your most recent med list.               aspirin EC 81 MG tablet  Take 81 mg by mouth. At supper.     benazepril-hydrochlorthiazide 20-12.5 MG tablet  Commonly known as:  LOTENSIN HCT  Take 1 tablet by mouth daily. In am.     docusate sodium 100 MG capsule  Commonly known as:  COLACE  Take 1 capsule (100 mg total) by mouth 2 (two) times daily.     DYMISTA 137-50 MCG/ACT Susp  Generic drug:  Azelastine-Fluticasone  PLACE 1 SPRAY INTO BOTH NOSTRILS 2 (TWO) TIMES DAILY.     fenofibrate micronized 134 MG capsule  Commonly known as:  LOFIBRA  Take 134 mg by mouth daily before breakfast.     Fish Oil 1200 MG Caps  Take 1 mg by mouth 2 (two) times daily.     HYDROcodone-acetaminophen 5-325 MG tablet  Commonly known as:  NORCO/VICODIN  Take 1-2 tablets by mouth every 6 (six) hours as needed for moderate pain.     multivitamin capsule  Take 1 capsule by mouth every morning.     oxybutynin 5 MG  tablet  Commonly known as:  DITROPAN  Take 1 tablet (5 mg total) by mouth every 8 (eight) hours as needed for bladder spasms.     tamsulosin 0.4 MG Caps capsule  Commonly known as:  FLOMAX  TAKE ONE CAPSULE BY MOUTH DAILY     TGT PSYLLIUM FIBER 0.52 g capsule  Generic drug:  psyllium  Take 0.52 g by mouth as needed.        Allergies:  Allergies  Allergen Reactions  . Demerol [Meperidine] Nausea And Vomiting    Other reaction(s): Unknown Other reaction(s): Unknown  . Lipitor [Atorvastatin] Swelling  . Septra [Sulfamethoxazole-Trimethoprim] Nausea And Vomiting    Per patient "this medication made me feel worse since starting this antibiotic".    . Sulfa Antibiotics Rash    Family History: Family History  Problem Relation Age of  Onset  . Hypertension Mother   . Hypertension Father   . Prostate cancer Brother     Social History:  reports that he has never smoked. He has never used smokeless tobacco. He reports that he does not drink alcohol or use illicit drugs.   Physical Exam: BP 138/77 mmHg  Pulse 75  Ht 6' (1.829 m)  Wt 188 lb (85.276 kg)  BMI 25.49 kg/m2  Constitutional:  Alert and oriented, No acute distress.  Wife present today. HEENT: [place] AT, moist mucus membranes.  Trachea midline, no masses. Cardiovascular: No clubbing, cyanosis, or edema.   Respiratory: Normal respiratory effort, no increased work of breathing.  Skin: No rashes, bruises or suspicious lesions. Neurologic: Grossly intact, no focal deficits, moving all 4 extremities. Psychiatric: Normal mood and affect.  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 Given multifocal high-grade tumor along with  ureteral lesion, I have recommended consideration of BCG therapy. We reviewed the risk and benefits of this in detail. We reviewed the treatment course and risks of infection/BCG sepsis along with bladder irritation.  Patient would like to proceed with BCG treatment.  2. Right distal ureteral cancer Second look ureteroscopy revealed no obvious residual tumor. Biopsies were negative. Given the concern for CIS/distal ureteral carcinoma, options were reviewed. This included nephroureteral rectum E, distal ureterectomy with reimplant, and the possibility of BCG treatment with a stent in place to allow for reflux of this medication into the distal ureter. With the benefits of each of their efficacy rates were discussed. There is limited data on BCG therapy for ureteral cancer, but theoretically when extrapolated from bladder cancer data, it should be efficacious.    He and his wife's questions were answered. They're most interested in BCG therapy with plans for return to the OR in 3 months for repeat cystoscopy, ureteroscopy, possible biopsies, and stent removal.  3. History of nephrolithiasis Currently stone free.   4. Nocturia/ history of retention Continue Flomax Patient advised to let us know if he is unable to void today. If this is the case, he may resume clean intermittent catheterization.  Return for schedule BCG x 6 weeks starting in 4 weeks from now, then follow up with me in 3 months.   [name], MD  [facility] [address], [place], [place] [address] [phone]

## 2015-09-19 NOTE — Progress Notes (Signed)
Catheter Removal  Patient is present today for a catheter removal.  8ml of water was drained from the balloon. A 16FR foley cath was removed from the bladder no complications were noted . Patient tolerated well.  Preformed by: Jonesha Tsuchiya, CMA  

## 2015-09-20 ENCOUNTER — Telehealth: Payer: Self-pay

## 2015-09-20 NOTE — Telephone Encounter (Signed)
Spoke with patient and let him know [name] said just to continue cathing himself as needed. Patient understands.

## 2015-09-20 NOTE — Telephone Encounter (Signed)
Have him continue CIC as needed.  [name], MD

## 2015-09-20 NOTE — Telephone Encounter (Signed)
Pt called stating he is unable to void. Pt denied n/v, f/c. Pt stated that he did perform CIC but only got out 100cc. Please advise.

## 2015-10-23 ENCOUNTER — Encounter: Payer: Self-pay | Admitting: Urology

## 2015-10-23 ENCOUNTER — Ambulatory Visit (INDEPENDENT_AMBULATORY_CARE_PROVIDER_SITE_OTHER): Payer: PPO | Admitting: Urology

## 2015-10-23 VITALS — BP 161/75 | HR 66 | Ht 72.0 in | Wt 191.7 lb

## 2015-10-23 DIAGNOSIS — R339 Retention of urine, unspecified: Secondary | ICD-10-CM

## 2015-10-23 DIAGNOSIS — C661 Malignant neoplasm of right ureter: Secondary | ICD-10-CM | POA: Diagnosis not present

## 2015-10-23 DIAGNOSIS — C679 Malignant neoplasm of bladder, unspecified: Secondary | ICD-10-CM

## 2015-10-23 LAB — URINALYSIS, COMPLETE
BILIRUBIN UA: NEGATIVE
GLUCOSE, UA: NEGATIVE
KETONES UA: NEGATIVE
LEUKOCYTES UA: NEGATIVE
Nitrite, UA: NEGATIVE
Protein, UA: NEGATIVE
SPEC GRAV UA: 1.015 (ref 1.005–1.030)
Urobilinogen, Ur: 0.2 mg/dL (ref 0.2–1.0)
pH, UA: 7.5 (ref 5.0–7.5)

## 2015-10-23 LAB — MICROSCOPIC EXAMINATION: Bacteria, UA: NONE SEEN

## 2015-10-23 MED ORDER — BCG LIVE 50 MG IS SUSR
3.2400 mL | Freq: Once | INTRAVESICAL | Status: AC
  Administered 2015-10-23: 81 mg via INTRAVESICAL

## 2015-10-23 MED ORDER — LIDOCAINE HCL 2 % EX GEL
1.0000 "application " | Freq: Once | CUTANEOUS | Status: AC
  Administered 2015-10-23: 1 via URETHRAL

## 2015-10-23 NOTE — Progress Notes (Signed)
BCG Bladder Instillation  BCG # 1  Due to Bladder Cancer patient is present today for a BCG treatment. Patient was cleaned and prepped in a sterile fashion with betadine and lidocaine 2% jelly was instilled into the urethra.  A 14 Coude FR catheter was inserted, urine return was noted 262m, urine was light yellow in color.  537mof reconstituted BCG was instilled into the bladder. The catheter was then removed. Patient tolerated well, no complications were noted  Preformed by: ShZara CouncilA-C and RaLyndee HensenMA  Follow up/ Additional notes: One Week

## 2015-10-23 NOTE — Progress Notes (Signed)
10:50 AM  10/23/15   Anthony Gonzales November 20, 1931 [id number]  Referring provider: Idelle Crouch, MD [facility] [place], [place] [address]  Chief Complaint  Patient presents with  . Follow-up    HPI: Patient is an 82 year old Caucasian male who presents today to begin a 6 weeks induction course of BCG.  Background history  82 year old male who initially presented with asymptomatic incidental moderate right hydroureteronephrosis in 12/2014.   He is taken to the OR on 01/30/2015 at which time right ureteroscopy revealed as suspicious area within the right distal ureter with a shaggy, irregular appearance but no obvious papillary or pedunculated tumor. Brush cytology was negative for any malignancy.   Ureteral biopsy revealed some atypia favoring neoplasm but essentially was non-diagnostic.   Interval follow-up renal ultrasound on 05/02/2015 did show complete resolution of his hydronephrosis.  He was counseled to return for a second look but deferred this for ~6 months.  He eventually return to the OR on 08/20/15 at which time the right distal ureteral was highly suspicious for papillary urothelial carcinoma was biopsied. 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   Most recently, he returned to the operating room on 09/12/2015 for a second look ureteroscopy/biopsy and TURBT. Given the depth of resection, mitomycin was not administered. Foley catheter remained in place for 1 week. His catheter was removed today. Pathology shows no residual tumor in the bladder or the distal ureter. A Bard Optima stent was replaced at the time of the procedure.  Never smoker.    He does have baseline chronic renal insufficiency.  Cr 1.6-2.   He also has a history of recurrent nephrolithiasis although no stone seen on CT scan as  of 12/2014.  He is currently on Flomax with improvement in his urinary symptoms.  He has a history of post op urinary retention and knows how to CIC as needed.   Today, his only complaint is a weak urinary stream.  He does not report gross hematuria, dysuria, suprapubic pain.  He does not have a fever, chills, nausea or vomiting.  He does not have a cough.  UA today is unremarkable.  BCG is explained.  Consent is signed.    PMH: Past Medical History:  Diagnosis Date  . Arthritis   . Benign fibroma of prostate 08/23/2013  . BPH (benign prostatic hyperplasia)   . Calculus of kidney 08/23/2013  . Cancer ([place])    skin  . Glaucoma    no drops in 3 mo pressure good, pt denies glaucoma, eye pressure has been measuring alright.  [name] HLD (hyperlipidemia)   . HTN (hypertension) 12/26/2014  . Hypertension   . Hyponatremia 12/26/2014  . Migraines    history of migraines when he was younger.  [name] Restless leg syndrome   . UTI (lower urinary tract infection) 12/26/2014  . Vertigo     Surgical History: Past Surgical History:  Procedure Laterality Date  . COLONOSCOPY    . CYSTOSCOPY W/ RETROGRADES Right 01/30/2015   Procedure: CYSTOSCOPY WITH RETROGRADE PYELOGRAM;  Surgeon: [name], MD;  Location: [facility];  Service: Urology;  Laterality: Right;  . CYSTOSCOPY W/ URETERAL STENT PLACEMENT Right 08/20/2015   Procedure: CYSTOSCOPY WITH RETROGRADE PYELOGRAM/POSSIBLE URETERAL STENT PLACEMENT/BLADDER BIOPSY;  Surgeon: [name], MD;  Location: [facility];  Service: Urology;  Laterality: Right;  . CYSTOSCOPY W/ URETERAL STENT PLACEMENT Right 09/12/2015   Procedure: CYSTOSCOPY WITH STENT REPLACEMENT;  Surgeon: [name], MD;  Location: [facility];  Service: Urology;  Laterality: Right;  . CYSTOSCOPY WITH BIOPSY Right 09/12/2015   Procedure: CYSTOSCOPY WITH BLADDER AND URETERAL BIOPSY;  Surgeon: [name], MD;  Location: [facility];  Service: Urology;  Laterality: Right;  . CYSTOSCOPY WITH  STENT PLACEMENT Right 01/30/2015   Procedure: CYSTOSCOPY WITH STENT PLACEMENT;  Surgeon: [name], MD;  Location: [facility];  Service: Urology;  Laterality: Right;  . EYE SURGERY Bilateral    Cataract Extraction with IOL  . HOLMIUM LASER APPLICATION N/A 4/43/1540   Procedure:  HOLMIUM LASER APPLICATION;  Surgeon: [name], MD;  Location: [facility];  Service: Urology;  Laterality: N/A;  . SPERMATOCELECTOMY    . TONSILLECTOMY    . TRANSURETHRAL RESECTION OF BLADDER TUMOR WITH MITOMYCIN-C N/A 09/12/2015   Procedure: TRANSURETHRAL RESECTION OF BLADDER TUMOR ;  Surgeon: [name], MD;  Location: [facility];  Service: Urology;  Laterality: N/A;  . URETEROSCOPY Right 01/30/2015   Procedure: URETEROSCOPY/ WITH BIOPSY AND CYTOLOGY BRUSHING;  Surgeon: [name], MD;  Location: [facility];  Service: Urology;  Laterality: Right;  . URETEROSCOPY Right 08/20/2015   Procedure: URETEROSCOPY;  Surgeon: [name], MD;  Location: [facility];  Service: Urology;  Laterality: Right;  . URETEROSCOPY Right 09/12/2015   Procedure: URETEROSCOPY;  Surgeon: [name], MD;  Location: [facility];  Service: Urology;  Laterality: Right;    Home Medications:    Medication List       Accurate as of 10/23/15 10:50 AM. Always use your most recent med list.          aspirin EC 81 MG tablet Take 81 mg by mouth. At supper.   benazepril-hydrochlorthiazide 20-12.5 MG tablet Commonly known as:  LOTENSIN HCT Take 1 tablet by mouth daily. In am.   docusate sodium 100 MG capsule Commonly known as:  COLACE Take 1 capsule (100 mg total) by mouth 2 (two) times daily.   DYMISTA 137-50 MCG/ACT Susp Generic drug:  Azelastine-Fluticasone PLACE 1 SPRAY INTO BOTH NOSTRILS 2 (TWO) TIMES DAILY.   fenofibrate micronized 134 MG capsule Commonly known as:  LOFIBRA Take 134 mg by mouth daily before breakfast.   Fish Oil 1200 MG Caps Take 1 mg by mouth 2 (two) times daily.   HYDROcodone-acetaminophen 5-325 MG  tablet Commonly known as:  NORCO/VICODIN Take 1-2 tablets by mouth every 6 (six) hours as needed for moderate pain.   multivitamin capsule Take 1 capsule by mouth every morning.   oxybutynin 5 MG tablet Commonly known as:  DITROPAN Take 1 tablet (5 mg total) by mouth every 8 (eight) hours as needed for bladder spasms.   tamsulosin 0.4 MG Caps capsule Commonly known as:  FLOMAX TAKE ONE CAPSULE BY MOUTH DAILY   TGT PSYLLIUM FIBER 0.52 g capsule Generic drug:  psyllium Take 0.52 g by mouth as needed.       Allergies:  Allergies  Allergen Reactions  . Demerol [Meperidine] Nausea And Vomiting    Other reaction(s): Unknown Other reaction(s): Unknown  . Lipitor [Atorvastatin] Swelling  . Septra [Sulfamethoxazole-Trimethoprim] Nausea And Vomiting    Per patient "this medication made me feel worse since starting this antibiotic".    . Sulfa Antibiotics Rash    Family History: Family History  Problem Relation Age of Onset  . Hypertension Mother   . Hypertension Father   . Prostate cancer Brother  Social History:  reports that he has never smoked. He has never used smokeless tobacco. He reports that he does not drink alcohol or use drugs.   Physical Exam: BP (!) 161/75 (BP Location: Left Arm, Patient Position: Sitting, Cuff Size: Normal)   Pulse 66   Ht 6' (1.829 m)   Wt 191 lb 11.2 oz (87 kg)   BMI 26.00 kg/m   Constitutional:  Alert and oriented, No acute distress.  Wife present today. HEENT: [place] AT, moist mucus membranes.  Trachea midline, no masses. Cardiovascular: No clubbing, cyanosis, or edema.   Respiratory: Normal respiratory effort, no increased work of breathing.  Skin: No rashes, bruises or suspicious lesions. Neurologic: Grossly intact, no focal deficits, moving all 4 extremities. Psychiatric: Normal mood and affect.  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 Given multifocal high-grade tumor along with ureteral lesion, I have recommended consideration of BCG therapy. We reviewed the risk and benefits of this in detail. We reviewed the treatment course and risks of infection/BCG sepsis along with bladder irritation.  Patient would like to proceed with BCG treatment.  - # 1 of 6 BCG given today  - patient remained in office for 30 minutes and tolerated the treatment  - instructed to pour bleach in the toilet after urination for 6 hours of the day of treatment  - advised to contact our office or seek treatment in the ED if becomes febrile, chills, vomiting, night sweats/new cough in order to arrange for emergent/urgent intervention  - RTC in one week for # 2 BCG  - cystoscopy in 3 months after completion of BCG in the OR  2. Right distal ureteral cancer Second look ureteroscopy revealed no obvious residual tumor. Biopsies were negative. Given the concern for CIS/distal ureteral carcinoma, options were reviewed. This included nephroureteral rectum E, distal ureterectomy with reimplant, and the possibility of BCG treatment with a stent in place to allow for reflux of this medication into the distal ureter. With the benefits of each of their efficacy rates were discussed. There is limited data on BCG therapy for ureteral cancer, but theoretically when extrapolated from bladder cancer data, it should be efficacious.    He and his wife's questions were answered. They're most interested in BCG therapy with plans for return to the OR in 3 months for repeat cystoscopy, ureteroscopy, possible biopsies, and stent removal.   - see above  3. History of nephrolithiasis Currently stone free.   4. Nocturia/ history of retention Continue  Flomax Patient advised to let us know if he is unable to void today. If this is the case, he may resume clean intermittent catheterization.  Return in about 1 week (around 10/30/2015) for # 2 BCG.   Zara Council, [facility] [address] [place], [place] [address] [phone]

## 2015-10-30 ENCOUNTER — Encounter: Payer: Self-pay | Admitting: Urology

## 2015-10-30 ENCOUNTER — Ambulatory Visit (INDEPENDENT_AMBULATORY_CARE_PROVIDER_SITE_OTHER): Payer: PPO | Admitting: Urology

## 2015-10-30 VITALS — BP 145/80 | HR 68 | Ht 72.0 in | Wt 191.0 lb

## 2015-10-30 DIAGNOSIS — R351 Nocturia: Secondary | ICD-10-CM

## 2015-10-30 DIAGNOSIS — N200 Calculus of kidney: Secondary | ICD-10-CM | POA: Diagnosis not present

## 2015-10-30 DIAGNOSIS — C661 Malignant neoplasm of right ureter: Secondary | ICD-10-CM

## 2015-10-30 DIAGNOSIS — C674 Malignant neoplasm of posterior wall of bladder: Secondary | ICD-10-CM | POA: Diagnosis not present

## 2015-10-30 LAB — URINALYSIS, COMPLETE
BILIRUBIN UA: NEGATIVE
Glucose, UA: NEGATIVE
Ketones, UA: NEGATIVE
NITRITE UA: NEGATIVE
Protein, UA: NEGATIVE
SPEC GRAV UA: 1.015 (ref 1.005–1.030)
Urobilinogen, Ur: 0.2 mg/dL (ref 0.2–1.0)
pH, UA: 7 (ref 5.0–7.5)

## 2015-10-30 LAB — MICROSCOPIC EXAMINATION

## 2015-10-30 MED ORDER — BCG LIVE 50 MG IS SUSR
3.2400 mL | Freq: Once | INTRAVESICAL | Status: AC
  Administered 2015-10-30: 81 mg via INTRAVESICAL

## 2015-10-30 NOTE — Progress Notes (Signed)
BCG Bladder Instillation  BCG # 2  Due to Bladder Cancer patient is present today for a BCG treatment. Patient was cleaned and prepped in a sterile fashion with betadine and lidocaine 2% jelly was instilled into the urethra.  A 14FR catheter was inserted, urine return was noted 41m, urine was yellow in color.  542mof reconstituted BCG was instilled into the bladder. The catheter was then removed. Patient tolerated well, no complications were noted  Preformed by: CaElberta LeatherwoodCMA/ ShZara CouncilA-C  Follow up/ Additional notes: 1 week for BCG #3

## 2015-10-30 NOTE — Progress Notes (Signed)
11:47 AM  10/30/15   Anthony Gonzales 02-21-1932 [id number]  Referring provider: Idelle Crouch, MD [facility] [place], [place] [address]  Chief Complaint  Patient presents with  . Follow-up    BCG treatment    HPI: Patient is an 82 year old Caucasian male who presents today for his second instillation of a 6 weeks induction course of BCG.  Background history  82 year old male who initially presented with asymptomatic incidental moderate right hydroureteronephrosis in 12/2014.   He is taken to the OR on 01/30/2015 at which time right ureteroscopy revealed as suspicious area within the right distal ureter with a shaggy, irregular appearance but no obvious papillary or pedunculated tumor. Brush cytology was negative for any malignancy.   Ureteral biopsy revealed some atypia favoring neoplasm but essentially was non-diagnostic.   Interval follow-up renal ultrasound on 05/02/2015 did show complete resolution of his hydronephrosis.  He was counseled to return for a second look but deferred this for ~6 months.  He eventually return to the OR on 08/20/15 at which time the right distal ureteral was highly suspicious for papillary urothelial carcinoma was biopsied. 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   Most recently, he returned to the operating room on 09/12/2015 for a second look ureteroscopy/biopsy and TURBT. Given the depth of resection, mitomycin was not administered. Foley catheter remained in place for 1 week. His catheter was removed today. Pathology shows no residual tumor in the bladder or the distal ureter. A Bard Optima stent was replaced at the time of the procedure.  Never smoker.    He does have baseline chronic renal insufficiency.  Cr 1.6-2.   He also has a history of recurrent  nephrolithiasis although no stone seen on CT scan as of 12/2014.  He is currently on Flomax with improvement in his urinary symptoms.  He has a history of post op urinary retention and knows how to CIC as needed.   Today, his only complaint is a weak urinary stream.  He does not report gross hematuria, dysuria, suprapubic pain.  He does not have a fever, chills, nausea or vomiting.  He does not have a cough.  UA today is at baseline.  He was able to hold the instillation for 2 hours.    PMH: Past Medical History:  Diagnosis Date  . Arthritis   . Benign fibroma of prostate 08/23/2013  . BPH (benign prostatic hyperplasia)   . Calculus of kidney 08/23/2013  . Cancer ([place])    skin  . Glaucoma    no drops in 3 mo pressure good, pt denies glaucoma, eye pressure has been measuring alright.  [name] HLD (hyperlipidemia)   . HTN (hypertension) 12/26/2014  . Hypertension   . Hyponatremia 12/26/2014  . Migraines    history of migraines when he was younger.  [name] Restless leg syndrome   . UTI (lower urinary tract infection) 12/26/2014  . Vertigo     Surgical History: Past Surgical History:  Procedure Laterality Date  . COLONOSCOPY    . CYSTOSCOPY W/ RETROGRADES Right 01/30/2015   Procedure: CYSTOSCOPY WITH RETROGRADE PYELOGRAM;  Surgeon: [name], MD;  Location: [facility];  Service: Urology;  Laterality: Right;  . CYSTOSCOPY W/ URETERAL STENT PLACEMENT Right 08/20/2015   Procedure: CYSTOSCOPY WITH RETROGRADE PYELOGRAM/POSSIBLE URETERAL STENT PLACEMENT/BLADDER BIOPSY;  Surgeon: [name], MD;  Location: [facility];  Service: Urology;  Laterality: Right;  . CYSTOSCOPY W/ URETERAL STENT PLACEMENT Right 09/12/2015   Procedure: CYSTOSCOPY WITH STENT REPLACEMENT;  Surgeon: [name], MD;  Location: [facility];  Service: Urology;  Laterality: Right;  . CYSTOSCOPY WITH BIOPSY Right 09/12/2015   Procedure: CYSTOSCOPY WITH BLADDER AND URETERAL BIOPSY;  Surgeon: [name], MD;  Location: [facility];   Service: Urology;  Laterality: Right;  . CYSTOSCOPY WITH STENT PLACEMENT Right 01/30/2015   Procedure: CYSTOSCOPY WITH STENT PLACEMENT;  Surgeon: [name], MD;  Location: [facility];  Service: Urology;  Laterality: Right;  . EYE SURGERY Bilateral    Cataract Extraction with IOL  . HOLMIUM LASER APPLICATION N/A 09/06/5282   Procedure:  HOLMIUM LASER APPLICATION;  Surgeon: [name], MD;  Location: [facility];  Service: Urology;  Laterality: N/A;  . SPERMATOCELECTOMY    . TONSILLECTOMY    . TRANSURETHRAL RESECTION OF BLADDER TUMOR WITH MITOMYCIN-C N/A 09/12/2015   Procedure: TRANSURETHRAL RESECTION OF BLADDER TUMOR ;  Surgeon: [name], MD;  Location: [facility];  Service: Urology;  Laterality: N/A;  . URETEROSCOPY Right 01/30/2015   Procedure: URETEROSCOPY/ WITH BIOPSY AND CYTOLOGY BRUSHING;  Surgeon: [name], MD;  Location: [facility];  Service: Urology;  Laterality: Right;  . URETEROSCOPY Right 08/20/2015   Procedure: URETEROSCOPY;  Surgeon: [name], MD;  Location: [facility];  Service: Urology;  Laterality: Right;  . URETEROSCOPY Right 09/12/2015   Procedure: URETEROSCOPY;  Surgeon: [name], MD;  Location: [facility];  Service: Urology;  Laterality: Right;    Home Medications:    Medication List       Accurate as of 10/30/15 11:47 AM. Always use your most recent med list.          aspirin EC 81 MG tablet Take 81 mg by mouth. At supper.   benazepril-hydrochlorthiazide 20-12.5 MG tablet Commonly known as:  LOTENSIN HCT Take 1 tablet by mouth daily. In am.   docusate sodium 100 MG capsule Commonly known as:  COLACE Take 1 capsule (100 mg total) by mouth 2 (two) times daily.   DYMISTA 137-50 MCG/ACT Susp Generic drug:  Azelastine-Fluticasone PLACE 1 SPRAY INTO BOTH NOSTRILS 2 (TWO) TIMES DAILY.   fenofibrate micronized 134 MG capsule Commonly known as:  LOFIBRA Take 134 mg by mouth daily before breakfast.   Fish Oil 1200 MG Caps Take 1 mg by mouth 2  (two) times daily.   HYDROcodone-acetaminophen 5-325 MG tablet Commonly known as:  NORCO/VICODIN Take 1-2 tablets by mouth every 6 (six) hours as needed for moderate pain.   multivitamin capsule Take 1 capsule by mouth every morning.   oxybutynin 5 MG tablet Commonly known as:  DITROPAN Take 1 tablet (5 mg total) by mouth every 8 (eight) hours as needed for bladder spasms.   tamsulosin 0.4 MG Caps capsule Commonly known as:  FLOMAX TAKE ONE CAPSULE BY MOUTH DAILY   TGT PSYLLIUM FIBER 0.52 g capsule Generic drug:  psyllium Take 0.52 g by mouth as needed.       Allergies:  Allergies  Allergen Reactions  . Demerol [Meperidine] Nausea And Vomiting    Other reaction(s): Unknown Other reaction(s): Unknown  . Lipitor [Atorvastatin] Swelling  . Septra [Sulfamethoxazole-Trimethoprim] Nausea And Vomiting    Per patient "this medication made me feel worse since starting this antibiotic".    . Sulfa Antibiotics Rash    Family History: Family History  Problem Relation Age of Onset  . Hypertension  Mother   . Hypertension Father   . Prostate cancer Brother     Social History:  reports that he has never smoked. He has never used smokeless tobacco. He reports that he does not drink alcohol or use drugs.   Physical Exam: BP (!) 145/80 (BP Location: Left Arm, Patient Position: Sitting, Cuff Size: Normal)   Pulse 68   Ht 6' (1.829 m)   Wt 191 lb (86.6 kg)   BMI 25.90 kg/m   Constitutional:  Alert and oriented, No acute distress.  HEENT: [place] AT, moist mucus membranes.  Trachea midline, no masses. Cardiovascular: No clubbing, cyanosis, or edema.   Respiratory: Normal respiratory effort, no increased work of breathing.  Skin: No rashes, bruises or suspicious lesions. Neurologic: Grossly intact, no focal deficits, moving all 4 extremities. Psychiatric: Normal mood and affect.  Laboratory Data: Lab Results  Component Value Date   WBC 7.0 02/04/2015   HGB 13.0 02/04/2015   HCT  39.1 (L) 02/04/2015   MCV 88.4 02/04/2015   PLT 235 02/04/2015    Lab Results  Component Value Date   CREATININE 1.27 (H) 08/10/2015   Urinalysis 3-10 RBC's, 6-10 WBC's and moderate bacteria.  See EPIC.    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 Given multifocal high-grade tumor along with ureteral lesion, I have recommended consideration of BCG therapy. We reviewed the risk and benefits of this in detail. We reviewed the treatment course and risks of infection/BCG sepsis along with bladder irritation.  Patient would like to proceed with BCG treatment.  - # 2 of 6 BCG given today  - patient instructed to do rotation at home  - instructed to pour bleach in the toilet after urination for 6 hours of the day of treatment  - advised to contact our office or seek treatment in the ED if becomes febrile, chills, vomiting, night sweats/new cough in order to arrange for emergent/urgent intervention  - RTC in one week for # 3 BCG  - cystoscopy in 3 months after completion of BCG in the OR  2. Right distal ureteral cancer Second look ureteroscopy revealed no obvious residual tumor. Biopsies were negative. Given the concern for CIS/distal ureteral carcinoma, options were reviewed. This included nephroureteral rectum E, distal ureterectomy with reimplant, and the possibility of BCG treatment with a stent in place to allow for reflux of this medication into the distal ureter. With the benefits of each of their efficacy rates were discussed. There is limited data on BCG therapy for ureteral cancer, but theoretically when extrapolated from bladder cancer data, it should be efficacious.    BCG therapy with plans for return to the OR in 3 months for repeat cystoscopy, ureteroscopy, possible biopsies, and stent removal.   - see above  3. History of nephrolithiasis Currently stone free.   4. Nocturia/ history of retention Continue  Flomax PVR minimal Patient advised to let us know if he is unable to void today. If this is the case, he may resume clean intermittent catheterization.  Return in about 1 week (around 11/06/2015) for # 3 BCG.   Zara Council, [facility] [address] [place], [place] [address] [phone]

## 2015-11-02 ENCOUNTER — Telehealth: Payer: Self-pay | Admitting: *Deleted

## 2015-11-02 NOTE — Telephone Encounter (Signed)
Patient called to report a low grade fever and headache after having his BCG treatment. I let him know that those symptoms were normal after that kind of treatment. Patient states understanding and asks if he can take aspirin for his headache. I let him know that it was ok to take aspirin for his headache. I told him if his symptoms become worse or more bothersome to call office back. Patient understands.

## 2015-11-06 ENCOUNTER — Ambulatory Visit (INDEPENDENT_AMBULATORY_CARE_PROVIDER_SITE_OTHER): Payer: PPO | Admitting: Urology

## 2015-11-06 ENCOUNTER — Encounter: Payer: Self-pay | Admitting: Urology

## 2015-11-06 VITALS — BP 136/79 | HR 77 | Temp 97.6°F | Ht 71.5 in | Wt 192.3 lb

## 2015-11-06 DIAGNOSIS — C679 Malignant neoplasm of bladder, unspecified: Secondary | ICD-10-CM

## 2015-11-06 LAB — URINALYSIS, COMPLETE
Bilirubin, UA: NEGATIVE
GLUCOSE, UA: NEGATIVE
KETONES UA: NEGATIVE
Nitrite, UA: NEGATIVE
Protein, UA: NEGATIVE
SPEC GRAV UA: 1.015 (ref 1.005–1.030)
Urobilinogen, Ur: 0.2 mg/dL (ref 0.2–1.0)
pH, UA: 7 (ref 5.0–7.5)

## 2015-11-06 LAB — MICROSCOPIC EXAMINATION: Bacteria, UA: NONE SEEN

## 2015-11-06 MED ORDER — LIDOCAINE HCL 2 % EX GEL
1.0000 "application " | Freq: Once | CUTANEOUS | Status: AC
  Administered 2015-11-06: 1 via URETHRAL

## 2015-11-06 MED ORDER — LIDOCAINE HCL 2 % EX GEL
1.0000 | Freq: Once | CUTANEOUS | Status: DC
Start: 2015-11-06 — End: 2015-11-06

## 2015-11-06 MED ORDER — BCG LIVE 50 MG IS SUSR
3.2400 mL | Freq: Once | INTRAVESICAL | Status: DC
Start: 2015-11-06 — End: 2015-11-06

## 2015-11-06 MED ORDER — BCG LIVE 50 MG IS SUSR
3.2400 mL | Freq: Once | INTRAVESICAL | Status: AC
  Administered 2015-11-06: 81 mg via INTRAVESICAL

## 2015-11-06 NOTE — Progress Notes (Signed)
11/06/2015 10:45 AM   [name] Sep 06, 1931 [id number]  Referring provider: Idelle Crouch, MD [facility] [place], [place] [address]  Chief Complaint  Patient presents with  . Bladder Cancer    BCG #3    HPI: Mr Lembke is an 82yo here for his 3rd BCG treatment.   After his 2nd BCG he developed a low grade fever of 92F. He had mild dysuria but no hematuria. Most recently, he returned to the operating room on 09/12/2015 for a second look ureteroscopy/biopsy and TURBT. Given the depth of resection, mitomycin was not administered. Foley catheter remained in place for 1 week. His catheter was removed today. Pathology shows no residual tumor in the bladder or the distal ureter. A Bard Optima stent was replaced at the time of the procedure   PMH: Past Medical History:  Diagnosis Date  . Arthritis   . Benign fibroma of prostate 08/23/2013  . BPH (benign prostatic hyperplasia)   . Calculus of kidney 08/23/2013  . Cancer ([place])    skin  . Glaucoma    no drops in 3 mo pressure good, pt denies glaucoma, eye pressure has been measuring alright.  [name] HLD (hyperlipidemia)   . HTN (hypertension) 12/26/2014  . Hypertension   . Hyponatremia 12/26/2014  . Migraines    history of migraines when he was younger.  [name] Restless leg syndrome   . UTI (lower urinary tract infection) 12/26/2014  . Vertigo     Surgical History: Past Surgical History:  Procedure Laterality Date  . COLONOSCOPY    . CYSTOSCOPY W/ RETROGRADES Right 01/30/2015   Procedure: CYSTOSCOPY WITH RETROGRADE PYELOGRAM;  Surgeon: [name], MD;  Location: [facility];  Service: Urology;  Laterality: Right;  . CYSTOSCOPY W/ URETERAL STENT PLACEMENT Right 08/20/2015   Procedure: CYSTOSCOPY WITH RETROGRADE PYELOGRAM/POSSIBLE URETERAL STENT PLACEMENT/BLADDER BIOPSY;  Surgeon: [name], MD;  Location: [facility];  Service: Urology;  Laterality: Right;  . CYSTOSCOPY W/ URETERAL STENT  PLACEMENT Right 09/12/2015   Procedure: CYSTOSCOPY WITH STENT REPLACEMENT;  Surgeon: [name], MD;  Location: [facility];  Service: Urology;  Laterality: Right;  . CYSTOSCOPY WITH BIOPSY Right 09/12/2015   Procedure: CYSTOSCOPY WITH BLADDER AND URETERAL BIOPSY;  Surgeon: [name], MD;  Location: [facility];  Service: Urology;  Laterality: Right;  . CYSTOSCOPY WITH STENT PLACEMENT Right 01/30/2015   Procedure: CYSTOSCOPY WITH STENT PLACEMENT;  Surgeon: [name], MD;  Location: [facility];  Service: Urology;  Laterality: Right;  . EYE SURGERY Bilateral    Cataract Extraction with IOL  . HOLMIUM LASER APPLICATION N/A 06/26/3788   Procedure:  HOLMIUM LASER APPLICATION;  Surgeon: [name], MD;  Location: [facility];  Service: Urology;  Laterality: N/A;  . SPERMATOCELECTOMY    . TONSILLECTOMY    . TRANSURETHRAL RESECTION OF BLADDER TUMOR WITH MITOMYCIN-C N/A 09/12/2015   Procedure: TRANSURETHRAL RESECTION OF BLADDER TUMOR ;  Surgeon: [name], MD;  Location: [facility];  Service: Urology;  Laterality: N/A;  . URETEROSCOPY Right 01/30/2015   Procedure: URETEROSCOPY/ WITH BIOPSY AND CYTOLOGY BRUSHING;  Surgeon: [name], MD;  Location: [facility];  Service: Urology;  Laterality: Right;  . URETEROSCOPY Right 08/20/2015   Procedure: URETEROSCOPY;  Surgeon: [name], MD;  Location: [facility];  Service: Urology;  Laterality: Right;  . URETEROSCOPY Right 09/12/2015   Procedure: URETEROSCOPY;  Surgeon: [name], MD;  Location: [facility];  Service: Urology;  Laterality: Right;    Home Medications:  Medication List       Accurate as of 11/06/15 10:45 AM. Always use your most recent med list.          aspirin EC 81 MG tablet Take 81 mg by mouth. At supper.   benazepril-hydrochlorthiazide 20-12.5 MG tablet Commonly known as:  LOTENSIN HCT Take 1 tablet by mouth daily. In am.   docusate sodium 100 MG capsule Commonly known as:  COLACE Take 1 capsule (100 mg total) by  mouth 2 (two) times daily.   DYMISTA 137-50 MCG/ACT Susp Generic drug:  Azelastine-Fluticasone PLACE 1 SPRAY INTO BOTH NOSTRILS 2 (TWO) TIMES DAILY.   fenofibrate micronized 134 MG capsule Commonly known as:  LOFIBRA Take 134 mg by mouth daily before breakfast.   Fish Oil 1200 MG Caps Take 1 mg by mouth 2 (two) times daily.   HYDROcodone-acetaminophen 5-325 MG tablet Commonly known as:  NORCO/VICODIN Take 1-2 tablets by mouth every 6 (six) hours as needed for moderate pain.   multivitamin capsule Take 1 capsule by mouth every morning.   oxybutynin 5 MG tablet Commonly known as:  DITROPAN Take 1 tablet (5 mg total) by mouth every 8 (eight) hours as needed for bladder spasms.   tamsulosin 0.4 MG Caps capsule Commonly known as:  FLOMAX TAKE ONE CAPSULE BY MOUTH DAILY   TGT PSYLLIUM FIBER 0.52 g capsule Generic drug:  psyllium Take 0.52 g by mouth as needed.       Allergies:  Allergies  Allergen Reactions  . Demerol [Meperidine] Nausea And Vomiting    Other reaction(s): Unknown Other reaction(s): Unknown  . Lipitor [Atorvastatin] Swelling  . Septra [Sulfamethoxazole-Trimethoprim] Nausea And Vomiting    Per patient "this medication made me feel worse since starting this antibiotic".    . Sulfa Antibiotics Rash    Family History: Family History  Problem Relation Age of Onset  . Hypertension Mother   . Hypertension Father   . Prostate cancer Brother     Social History:  reports that he has never smoked. He has never used smokeless tobacco. He reports that he does not drink alcohol or use drugs.  ROS: UROLOGY Frequent Urination?: No Hard to postpone urination?: No Burning/pain with urination?: No Get up at night to urinate?: Yes Leakage of urine?: No Urine stream starts and stops?: Yes Trouble starting stream?: No Do you have to strain to urinate?: No Blood in urine?: No Urinary tract infection?: No Sexually transmitted disease?: No Injury to kidneys or  bladder?: No Painful intercourse?: No Weak stream?: No Erection problems?: No Penile pain?: No  Gastrointestinal Nausea?: No Vomiting?: No Indigestion/heartburn?: No Diarrhea?: No Constipation?: No  Constitutional Fever: Yes Night sweats?: No Weight loss?: No Fatigue?: Yes  Skin Skin rash/lesions?: No Itching?: No  Eyes Blurred vision?: No Double vision?: No  Ears/Nose/Throat Sore throat?: No Sinus problems?: No  Hematologic/Lymphatic Swollen glands?: No Easy bruising?: No  Cardiovascular Leg swelling?: No Chest pain?: No  Respiratory Cough?: No Shortness of breath?: No  Endocrine Excessive thirst?: No  Musculoskeletal Back pain?: No Joint pain?: No  Neurological Headaches?: Yes Dizziness?: No  Psychologic Depression?: No Anxiety?: No  Physical Exam: BP 136/79   Pulse 77   Temp 97.6 F (36.4 C) (Oral)   Ht 5' 11.5" (1.816 m)   Wt 87.2 kg (192 lb 4.8 oz)   BMI 26.45 kg/m   Constitutional:  Alert and oriented, No acute distress. HEENT: [place] AT, moist mucus membranes.  Trachea midline, no masses. Cardiovascular: No clubbing, cyanosis, or edema.  Respiratory: Normal respiratory effort, no increased work of breathing. GI: Abdomen is soft, nontender, nondistended, no abdominal masses GU: No CVA tenderness.  Skin: No rashes, bruises or suspicious lesions. Lymph: No cervical or inguinal adenopathy. Neurologic: Grossly intact, no focal deficits, moving all 4 extremities. Psychiatric: Normal mood and affect.  Laboratory Data: Lab Results  Component Value Date   WBC 7.0 02/04/2015   HGB 13.0 02/04/2015   HCT 39.1 (L) 02/04/2015   MCV 88.4 02/04/2015   PLT 235 02/04/2015    Lab Results  Component Value Date   CREATININE 1.27 (H) 08/10/2015    No results found for: PSA  No results found for: TESTOSTERONE  No results found for: HGBA1C  Urinalysis    Component Value Date/Time   COLORURINE YELLOW (A) 02/04/2015 1113   APPEARANCEUR  Clear 10/30/2015 1017   LABSPEC 1.010 02/04/2015 1113   LABSPEC 1.004 03/22/2013 0938   PHURINE 7.0 02/04/2015 1113   GLUCOSEU Negative 10/30/2015 1017   GLUCOSEU Negative 03/22/2013 0938   HGBUR 3+ (A) 02/04/2015 1113   BILIRUBINUR Negative 10/30/2015 1017   BILIRUBINUR Negative 03/22/2013 [place] 02/04/2015 1113   PROTEINUR Negative 10/30/2015 1017   PROTEINUR 100 (A) 02/04/2015 1113   NITRITE Negative 10/30/2015 1017   NITRITE NEGATIVE 02/04/2015 1113   LEUKOCYTESUR 1+ (A) 10/30/2015 1017   LEUKOCYTESUR Negative 03/22/2013 0938    Pertinent Imaging: non  Assessment & Plan:    1. Malignant neoplasm of urinary bladder, unspecified site (HCC) -schedule for BCG #4 in 1 week - Urinalysis, Complete   No Follow-up on file.  Nicolette Bang, MD  [facility] [address], [place], [place] [address] [phone]

## 2015-11-06 NOTE — Progress Notes (Signed)
BCG Bladder Instillation  BCG # 3  Due to Bladder Cancer patient is present today for a BCG treatment. Patient was cleaned and prepped in a sterile fashion with betadine and lidocaine 2% jelly was instilled into the urethra.  A 14FR catheter was inserted, urine return was noted 191m, urine was yellow in color.  598mof reconstituted BCG was instilled into the bladder. The catheter was then removed. Patient tolerated well, no complications were noted.   Preformed by: RaLyndee HensenMA and SaFonnie JarvisMA  Follow up/ Additional notes: One week

## 2015-11-13 ENCOUNTER — Ambulatory Visit (INDEPENDENT_AMBULATORY_CARE_PROVIDER_SITE_OTHER): Payer: PPO | Admitting: Urology

## 2015-11-13 ENCOUNTER — Encounter: Payer: Self-pay | Admitting: Urology

## 2015-11-13 VITALS — BP 155/77 | HR 66 | Ht 72.0 in | Wt 191.9 lb

## 2015-11-13 DIAGNOSIS — N200 Calculus of kidney: Secondary | ICD-10-CM | POA: Diagnosis not present

## 2015-11-13 DIAGNOSIS — C679 Malignant neoplasm of bladder, unspecified: Secondary | ICD-10-CM | POA: Diagnosis not present

## 2015-11-13 DIAGNOSIS — C661 Malignant neoplasm of right ureter: Secondary | ICD-10-CM

## 2015-11-13 DIAGNOSIS — R351 Nocturia: Secondary | ICD-10-CM | POA: Diagnosis not present

## 2015-11-13 LAB — URINALYSIS, COMPLETE
BILIRUBIN UA: NEGATIVE
Glucose, UA: NEGATIVE
KETONES UA: NEGATIVE
NITRITE UA: NEGATIVE
Protein, UA: NEGATIVE
SPEC GRAV UA: 1.015 (ref 1.005–1.030)
Urobilinogen, Ur: 0.2 mg/dL (ref 0.2–1.0)
pH, UA: 7.5 (ref 5.0–7.5)

## 2015-11-13 LAB — MICROSCOPIC EXAMINATION: WBC, UA: >30 /hpf — AB (ref 0–?)

## 2015-11-13 MED ORDER — LIDOCAINE HCL 2 % EX GEL
1.0000 "application " | Freq: Once | CUTANEOUS | Status: AC
  Administered 2015-11-13: 1 via URETHRAL

## 2015-11-13 MED ORDER — BCG LIVE 50 MG IS SUSR
3.2400 mL | Freq: Once | INTRAVESICAL | Status: AC
  Administered 2015-11-13: 81 mg via INTRAVESICAL

## 2015-11-13 NOTE — Progress Notes (Signed)
BCG Bladder Instillation  BCG # 4  Due to Bladder Cancer patient is present today for a BCG treatment. Patient was cleaned and prepped in a sterile fashion with betadine and lidocaine 2% jelly was instilled into the urethra.  A 14FR catheter was inserted, urine return was noted 145m, urine was yellow in color.  523mof reconstituted BCG was instilled into the bladder. The catheter was then removed. Patient tolerated well, no complications were noted  Preformed by: ShZara CouncilA-C and RaLyndee HensenMA  Follow up/ Additional notes: One week

## 2015-11-13 NOTE — Progress Notes (Signed)
11:12 AM  11/13/15   [name] 12-20-1931 [id number]  Referring provider: Idelle Crouch, MD [facility] [place], [place] [address]  Chief Complaint  Patient presents with  . Bladder Cancer    BCG # 4    HPI: Patient is an 82 year old Caucasian male who presents today for his fourth instillation of a 6 weeks induction course of BCG.  Background history  82 year old male who initially presented with asymptomatic incidental moderate right hydroureteronephrosis in 12/2014.   He is taken to the OR on 01/30/2015 at which time right ureteroscopy revealed as suspicious area within the right distal ureter with a shaggy, irregular appearance but no obvious papillary or pedunculated tumor. Brush cytology was negative for any malignancy.   Ureteral biopsy revealed some atypia favoring neoplasm but essentially was non-diagnostic.   Interval follow-up renal ultrasound on 05/02/2015 did show complete resolution of his hydronephrosis.  He was counseled to return for a second look but deferred this for ~6 months.  He eventually return to the OR on 08/20/15 at which time the right distal ureteral was highly suspicious for papillary urothelial carcinoma was biopsied. 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   Most recently, he returned to the operating room on 09/12/2015 for a second look ureteroscopy/biopsy and TURBT. Given the depth of resection, mitomycin was not administered. Foley catheter remained in place for 1 week. His catheter was removed today. Pathology shows no residual tumor in the bladder or the distal ureter. A Bard Optima stent was replaced at the time of the procedure.  Never smoker.    He does have baseline chronic renal insufficiency.  Cr 1.6-2.   He also has a history of recurrent  nephrolithiasis although no stone seen on CT scan as of 12/2014.  He is currently on Flomax with improvement in his urinary symptoms.  He has a history of post op urinary retention and knows how to CIC as needed.   Today, his only complaint is a frequent urination and nocturia.  He does not report gross hematuria, dysuria, suprapubic pain.  He has not had any flank pain.  He does not have a fever, chills, nausea or vomiting.  He does not have a cough.  UA today was unremarkable.  He was able to hold the instillation for 2 hours.    PMH: Past Medical History:  Diagnosis Date  . Arthritis   . Benign fibroma of prostate 08/23/2013  . BPH (benign prostatic hyperplasia)   . Calculus of kidney 08/23/2013  . Cancer ([place])    skin  . Glaucoma    no drops in 3 mo pressure good, pt denies glaucoma, eye pressure has been measuring alright.  [name] HLD (hyperlipidemia)   . HTN (hypertension) 12/26/2014  . Hypertension   . Hyponatremia 12/26/2014  . Migraines    history of migraines when he was younger.  [name] Restless leg syndrome   . UTI (lower urinary tract infection) 12/26/2014  . Vertigo     Surgical History: Past Surgical History:  Procedure Laterality Date  . COLONOSCOPY    . CYSTOSCOPY W/ RETROGRADES Right 01/30/2015   Procedure: CYSTOSCOPY WITH RETROGRADE PYELOGRAM;  Surgeon: [name], MD;  Location: [facility];  Service: Urology;  Laterality: Right;  . CYSTOSCOPY W/ URETERAL STENT PLACEMENT Right 08/20/2015  Procedure: CYSTOSCOPY WITH RETROGRADE PYELOGRAM/POSSIBLE URETERAL STENT PLACEMENT/BLADDER BIOPSY;  Surgeon: [name], MD;  Location: [facility];  Service: Urology;  Laterality: Right;  . CYSTOSCOPY W/ URETERAL STENT PLACEMENT Right 09/12/2015   Procedure: CYSTOSCOPY WITH STENT REPLACEMENT;  Surgeon: [name], MD;  Location: [facility];  Service: Urology;  Laterality: Right;  . CYSTOSCOPY WITH BIOPSY Right 09/12/2015   Procedure: CYSTOSCOPY WITH BLADDER AND URETERAL BIOPSY;   Surgeon: [name], MD;  Location: [facility];  Service: Urology;  Laterality: Right;  . CYSTOSCOPY WITH STENT PLACEMENT Right 01/30/2015   Procedure: CYSTOSCOPY WITH STENT PLACEMENT;  Surgeon: [name], MD;  Location: [facility];  Service: Urology;  Laterality: Right;  . EYE SURGERY Bilateral    Cataract Extraction with IOL  . HOLMIUM LASER APPLICATION N/A 8/58/8502   Procedure:  HOLMIUM LASER APPLICATION;  Surgeon: [name], MD;  Location: [facility];  Service: Urology;  Laterality: N/A;  . SPERMATOCELECTOMY    . TONSILLECTOMY    . TRANSURETHRAL RESECTION OF BLADDER TUMOR WITH MITOMYCIN-C N/A 09/12/2015   Procedure: TRANSURETHRAL RESECTION OF BLADDER TUMOR ;  Surgeon: [name], MD;  Location: [facility];  Service: Urology;  Laterality: N/A;  . URETEROSCOPY Right 01/30/2015   Procedure: URETEROSCOPY/ WITH BIOPSY AND CYTOLOGY BRUSHING;  Surgeon: [name], MD;  Location: [facility];  Service: Urology;  Laterality: Right;  . URETEROSCOPY Right 08/20/2015   Procedure: URETEROSCOPY;  Surgeon: [name], MD;  Location: [facility];  Service: Urology;  Laterality: Right;  . URETEROSCOPY Right 09/12/2015   Procedure: URETEROSCOPY;  Surgeon: [name], MD;  Location: [facility];  Service: Urology;  Laterality: Right;    Home Medications:    Medication List       Accurate as of 11/13/15 11:12 AM. Always use your most recent med list.          aspirin EC 81 MG tablet Take 81 mg by mouth. At supper.   benazepril-hydrochlorthiazide 20-12.5 MG tablet Commonly known as:  LOTENSIN HCT Take 1 tablet by mouth daily. In am.   docusate sodium 100 MG capsule Commonly known as:  COLACE Take 1 capsule (100 mg total) by mouth 2 (two) times daily.   DYMISTA 137-50 MCG/ACT Susp Generic drug:  Azelastine-Fluticasone PLACE 1 SPRAY INTO BOTH NOSTRILS 2 (TWO) TIMES DAILY.   fenofibrate micronized 134 MG capsule Commonly known as:  LOFIBRA Take 134 mg by mouth daily before  breakfast.   Fish Oil 1200 MG Caps Take 1 mg by mouth 2 (two) times daily.   HYDROcodone-acetaminophen 5-325 MG tablet Commonly known as:  NORCO/VICODIN Take 1-2 tablets by mouth every 6 (six) hours as needed for moderate pain.   multivitamin capsule Take 1 capsule by mouth every morning.   oxybutynin 5 MG tablet Commonly known as:  DITROPAN Take 1 tablet (5 mg total) by mouth every 8 (eight) hours as needed for bladder spasms.   tamsulosin 0.4 MG Caps capsule Commonly known as:  FLOMAX TAKE ONE CAPSULE BY MOUTH DAILY   TGT PSYLLIUM FIBER 0.52 g capsule Generic drug:  psyllium Take 0.52 g by mouth as needed.       Allergies:  Allergies  Allergen Reactions  . Demerol [Meperidine] Nausea And Vomiting    Other reaction(s): Unknown Other reaction(s): Unknown  . Lipitor [Atorvastatin] Swelling  . Septra [Sulfamethoxazole-Trimethoprim] Nausea And Vomiting    Per patient "this medication made me feel worse since starting this antibiotic".    . Sulfa Antibiotics Rash    Family History: Family  History  Problem Relation Age of Onset  . Hypertension Mother   . Hypertension Father   . Prostate cancer Brother     Social History:  reports that he has never smoked. He has never used smokeless tobacco. He reports that he does not drink alcohol or use drugs.   Physical Exam: BP (!) 155/77   Pulse 66   Ht 6' (1.829 m)   Wt 191 lb 14.4 oz (87 kg)   BMI 26.03 kg/m   Constitutional:  Alert and oriented, No acute distress.  HEENT: [place] AT, moist mucus membranes.  Trachea midline, no masses. Cardiovascular: No clubbing, cyanosis, or edema.   Respiratory: Normal respiratory effort, no increased work of breathing.  Skin: No rashes, bruises or suspicious lesions. Neurologic: Grossly intact, no focal deficits, moving all 4 extremities. Psychiatric: Normal mood and affect.  Laboratory Data: Lab Results  Component Value Date   WBC 7.0 02/04/2015   HGB 13.0 02/04/2015   HCT 39.1  (L) 02/04/2015   MCV 88.4 02/04/2015   PLT 235 02/04/2015    Lab Results  Component Value Date   CREATININE 1.27 (H) 08/10/2015   Urinalysis > 30 WBC's/hpf.  See EPIC.    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 Given multifocal high-grade tumor along with ureteral lesion, I have recommended consideration of BCG therapy. We reviewed the risk and benefits of this in detail. We reviewed the treatment course and risks of infection/BCG sepsis along with bladder irritation.  Patient would like to proceed with BCG treatment.  - # 4 of 6 BCG given today  - patient instructed to do rotation at home  - instructed to pour bleach in the toilet after urination for 6 hours of the day of treatment  - advised to contact our office or seek treatment in the ED if becomes febrile, chills, vomiting, night sweats/new cough in order to arrange for emergent/urgent intervention  - RTC in one week for # 5 BCG  - cystoscopy in 3 months after completion of BCG in the OR  2. Right distal ureteral cancer Second look ureteroscopy revealed no obvious residual tumor. Biopsies were negative. Given the concern for CIS/distal ureteral carcinoma, options were reviewed. This included nephroureteral rectum E, distal ureterectomy with reimplant, and the possibility of BCG treatment with a stent in place to allow for reflux of this medication into the distal ureter. With the benefits of each of their efficacy rates were discussed. There is limited data on BCG therapy for ureteral cancer, but theoretically when extrapolated from bladder cancer data, it should be efficacious.    BCG therapy with plans for return to the OR in 3 months for repeat cystoscopy, ureteroscopy, possible biopsies, and stent removal.   - see above  3. History of nephrolithiasis Currently stone free.   4. Nocturia/ history of retention Continue Flomax PVR minimal Patient advised to  let us know if he is unable to void today. If this is the case, he may resume clean intermittent catheterization.  Return in about 1 week (around 11/20/2015) for # 5 BCG.   Zara Council, [facility] [address] [place], [place] [address] [phone]

## 2015-11-20 ENCOUNTER — Ambulatory Visit (INDEPENDENT_AMBULATORY_CARE_PROVIDER_SITE_OTHER): Payer: PPO | Admitting: Urology

## 2015-11-20 ENCOUNTER — Encounter: Payer: Self-pay | Admitting: Urology

## 2015-11-20 VITALS — BP 135/72 | HR 72 | Ht 72.0 in | Wt 193.1 lb

## 2015-11-20 DIAGNOSIS — C679 Malignant neoplasm of bladder, unspecified: Secondary | ICD-10-CM | POA: Diagnosis not present

## 2015-11-20 DIAGNOSIS — R339 Retention of urine, unspecified: Secondary | ICD-10-CM | POA: Diagnosis not present

## 2015-11-20 DIAGNOSIS — N200 Calculus of kidney: Secondary | ICD-10-CM

## 2015-11-20 DIAGNOSIS — C661 Malignant neoplasm of right ureter: Secondary | ICD-10-CM

## 2015-11-20 LAB — MICROSCOPIC EXAMINATION
Bacteria, UA: NONE SEEN
RBC MICROSCOPIC, UA: NONE SEEN /HPF (ref 0–?)

## 2015-11-20 LAB — URINALYSIS, COMPLETE
Bilirubin, UA: NEGATIVE
Glucose, UA: NEGATIVE
Ketones, UA: NEGATIVE
Nitrite, UA: NEGATIVE
Protein, UA: NEGATIVE
Specific Gravity, UA: 1.02 (ref 1.005–1.030)
Urobilinogen, Ur: 0.2 mg/dL (ref 0.2–1.0)
pH, UA: 7 (ref 5.0–7.5)

## 2015-11-20 MED ORDER — LIDOCAINE HCL 2 % EX GEL
1.0000 "application " | Freq: Once | CUTANEOUS | Status: AC
  Administered 2015-11-20: 1 via URETHRAL

## 2015-11-20 MED ORDER — BCG LIVE 50 MG IS SUSR
3.2400 mL | Freq: Once | INTRAVESICAL | Status: AC
  Administered 2015-11-20: 81 mg via INTRAVESICAL

## 2015-11-20 NOTE — Progress Notes (Signed)
BCG Bladder Instillation  BCG # 5  Due to Bladder Cancer patient is present today for a BCG treatment. Patient was cleaned and prepped in a sterile fashion with betadine and lidocaine 2% jelly was instilled into the urethra.  A 14FR catheter was inserted, urine return was noted 88m, urine was yellow  in color.  560mof reconstituted BCG was instilled into the bladder. The catheter was then removed. Patient tolerated well, no complications were noted  Preformed by: ShZara CouncilA-C and RaLyndee HensenMA  Follow up/ Additional notes: One week

## 2015-11-20 NOTE — Progress Notes (Signed)
10:43 AM  11/20/15   Anthony Gonzales [birth year] [id number]  Referring provider: Idelle Crouch, MD [facility] [place], [place] [address]  Chief Complaint  Patient presents with  . Bladder Cancer    BCG 5    HPI: Patient is an 82 year old Caucasian male who presents today for his fifth instillation of a 6 weeks induction course of BCG.  Background history  82 year old male who initially presented with asymptomatic incidental moderate right hydroureteronephrosis in 12/2014.   He is taken to the OR on 01/30/2015 at which time right ureteroscopy revealed as suspicious area within the right distal ureter with a shaggy, irregular appearance but no obvious papillary or pedunculated tumor. Brush cytology was negative for any malignancy.   Ureteral biopsy revealed some atypia favoring neoplasm but essentially was non-diagnostic.   Interval follow-up renal ultrasound on 05/02/2015 did show complete resolution of his hydronephrosis.  He was counseled to return for a second look but deferred this for ~6 months.  He eventually return to the OR on 08/20/15 at which time the right distal ureteral was highly suspicious for papillary urothelial carcinoma was biopsied. 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   Most recently, he returned to the operating room on 09/12/2015 for a second look ureteroscopy/biopsy and TURBT. Given the depth of resection, mitomycin was not administered. Foley catheter remained in place for 1 week. His catheter was removed today. Pathology shows no residual tumor in the bladder or the distal ureter. A Bard Optima stent was replaced at the time of the procedure.  Never smoker.    He does have baseline chronic renal insufficiency.  Cr 1.6-2.   He also has a history of recurrent nephrolithiasis  although no stone seen on CT scan as of 12/2014.  He is currently on Flomax with improvement in his urinary symptoms.  He has a history of post op urinary retention and knows how to CIC as needed.  Today, his only complaint is nocturia.  He does not report gross hematuria, dysuria, suprapubic pain.  He has not had any flank pain.  He does not have a fever, chills, nausea or vomiting.  He does not have a cough.  UA today noted > 30RBC's/hpf.  He was able to hold the last instillation for 2 hours.    PMH: Past Medical History:  Diagnosis Date  . Arthritis   . Benign fibroma of prostate 08/23/2013  . BPH (benign prostatic hyperplasia)   . Calculus of kidney 08/23/2013  . Cancer ([place])    skin  . Glaucoma    no drops in 3 mo pressure good, pt denies glaucoma, eye pressure has been measuring alright.  [name] HLD (hyperlipidemia)   . HTN (hypertension) 12/26/2014  . Hypertension   . Hyponatremia 12/26/2014  . Migraines    history of migraines when he was younger.  [name] Restless leg syndrome   . UTI (lower urinary tract infection) 12/26/2014  . Vertigo     Surgical History: Past Surgical History:  Procedure Laterality Date  . COLONOSCOPY    . CYSTOSCOPY W/ RETROGRADES Right 01/30/2015   Procedure: CYSTOSCOPY WITH RETROGRADE PYELOGRAM;  Surgeon: [name], MD;  Location: [facility];  Service: Urology;  Laterality: Right;  . CYSTOSCOPY W/ URETERAL STENT PLACEMENT Right 08/20/2015   Procedure: CYSTOSCOPY WITH RETROGRADE  PYELOGRAM/POSSIBLE URETERAL STENT PLACEMENT/BLADDER BIOPSY;  Surgeon: [name], MD;  Location: [facility];  Service: Urology;  Laterality: Right;  . CYSTOSCOPY W/ URETERAL STENT PLACEMENT Right 09/12/2015   Procedure: CYSTOSCOPY WITH STENT REPLACEMENT;  Surgeon: [name], MD;  Location: [facility];  Service: Urology;  Laterality: Right;  . CYSTOSCOPY WITH BIOPSY Right 09/12/2015   Procedure: CYSTOSCOPY WITH BLADDER AND URETERAL BIOPSY;  Surgeon: [name], MD;  Location:  [facility];  Service: Urology;  Laterality: Right;  . CYSTOSCOPY WITH STENT PLACEMENT Right 01/30/2015   Procedure: CYSTOSCOPY WITH STENT PLACEMENT;  Surgeon: [name], MD;  Location: [facility];  Service: Urology;  Laterality: Right;  . EYE SURGERY Bilateral    Cataract Extraction with IOL  . HOLMIUM LASER APPLICATION N/A 11/01/537   Procedure:  HOLMIUM LASER APPLICATION;  Surgeon: [name], MD;  Location: [facility];  Service: Urology;  Laterality: N/A;  . SPERMATOCELECTOMY    . TONSILLECTOMY    . TRANSURETHRAL RESECTION OF BLADDER TUMOR WITH MITOMYCIN-C N/A 09/12/2015   Procedure: TRANSURETHRAL RESECTION OF BLADDER TUMOR ;  Surgeon: [name], MD;  Location: [facility];  Service: Urology;  Laterality: N/A;  . URETEROSCOPY Right 01/30/2015   Procedure: URETEROSCOPY/ WITH BIOPSY AND CYTOLOGY BRUSHING;  Surgeon: [name], MD;  Location: [facility];  Service: Urology;  Laterality: Right;  . URETEROSCOPY Right 08/20/2015   Procedure: URETEROSCOPY;  Surgeon: [name], MD;  Location: [facility];  Service: Urology;  Laterality: Right;  . URETEROSCOPY Right 09/12/2015   Procedure: URETEROSCOPY;  Surgeon: [name], MD;  Location: [facility];  Service: Urology;  Laterality: Right;    Home Medications:    Medication List       Accurate as of 11/20/15 10:43 AM. Always use your most recent med list.          aspirin EC 81 MG tablet Take 81 mg by mouth. At supper.   benazepril-hydrochlorthiazide 20-12.5 MG tablet Commonly known as:  LOTENSIN HCT Take 1 tablet by mouth daily. In am.   docusate sodium 100 MG capsule Commonly known as:  COLACE Take 1 capsule (100 mg total) by mouth 2 (two) times daily.   DYMISTA 137-50 MCG/ACT Susp Generic drug:  Azelastine-Fluticasone PLACE 1 SPRAY INTO BOTH NOSTRILS 2 (TWO) TIMES DAILY.   fenofibrate micronized 134 MG capsule Commonly known as:  LOFIBRA Take 134 mg by mouth daily before breakfast.   Fish Oil 1200 MG Caps Take 1 mg  by mouth 2 (two) times daily.   HYDROcodone-acetaminophen 5-325 MG tablet Commonly known as:  NORCO/VICODIN Take 1-2 tablets by mouth every 6 (six) hours as needed for moderate pain.   multivitamin capsule Take 1 capsule by mouth every morning.   oxybutynin 5 MG tablet Commonly known as:  DITROPAN Take 1 tablet (5 mg total) by mouth every 8 (eight) hours as needed for bladder spasms.   tamsulosin 0.4 MG Caps capsule Commonly known as:  FLOMAX TAKE ONE CAPSULE BY MOUTH DAILY   TGT PSYLLIUM FIBER 0.52 g capsule Generic drug:  psyllium Take 0.52 g by mouth as needed.       Allergies:  Allergies  Allergen Reactions  . Demerol [Meperidine] Nausea And Vomiting    Other reaction(s): Unknown Other reaction(s): Unknown  . Lipitor [Atorvastatin] Swelling  . Septra [Sulfamethoxazole-Trimethoprim] Nausea And Vomiting    Per patient "this medication made me feel worse since starting this antibiotic".    . Sulfa Antibiotics Rash    Family History: Family History  Problem Relation  Age of Onset  . Hypertension Mother   . Hypertension Father   . Prostate cancer Brother   . Kidney disease Neg Hx     Social History:  reports that he has never smoked. He has never used smokeless tobacco. He reports that he does not drink alcohol or use drugs.   Physical Exam: BP 135/72   Pulse 72   Ht 6' (1.829 m)   Wt 193 lb 1.6 oz (87.6 kg)   BMI 26.19 kg/m   Constitutional:  Alert and oriented, No acute distress.  HEENT: [place] AT, moist mucus membranes.  Trachea midline, no masses. Cardiovascular: No clubbing, cyanosis, or edema.   Respiratory: Normal respiratory effort, no increased work of breathing.  Skin: No rashes, bruises or suspicious lesions. Neurologic: Grossly intact, no focal deficits, moving all 4 extremities. Psychiatric: Normal mood and affect.  Laboratory Data: Lab Results  Component Value Date   WBC 7.0 02/04/2015   HGB 13.0 02/04/2015   HCT 39.1 (L) 02/04/2015   MCV  88.4 02/04/2015   PLT 235 02/04/2015    Lab Results  Component Value Date   CREATININE 1.27 (H) 08/10/2015   Urinalysis > 30 WBC's/hpf.  See EPIC.    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 Given multifocal high-grade tumor along with ureteral lesion, I have recommended consideration of BCG therapy.   - # 5 of 6 BCG given today  - patient instructed to do rotation at home  - instructed to pour bleach in the toilet after urination for 6 hours of the day of treatment  - advised to contact our office or seek treatment in the ED if becomes febrile, chills, vomiting, night sweats/new cough in order to arrange for emergent/urgent intervention  - RTC in one week for # 6 BCG  - cystoscopy in 3 months after completion of BCG in the OR  2. Right distal ureteral cancer Second look ureteroscopy revealed no obvious residual tumor. Biopsies were negative. Given the concern for CIS/distal ureteral carcinoma, options were reviewed. This included nephroureteral rectum E, distal ureterectomy with reimplant, and the possibility of BCG treatment with a stent in place to allow for reflux of this medication into the distal ureter. With the benefits of each of their efficacy rates were discussed. There is limited data on BCG therapy for ureteral cancer, but theoretically when extrapolated from bladder cancer data, it should be efficacious.    BCG therapy with plans for return to the OR in 3 months for repeat cystoscopy, ureteroscopy, possible biopsies, and stent removal.   - see above  3. History of nephrolithiasis Currently stone free.   4. Nocturia/ history of retention Continue Flomax PVR minimal Patient advised to let us know if he is unable to void today. If this is the case, he may resume clean intermittent catheterization.  Return in about 1 week (around 11/27/2015) for # 6 BCG.   Zara Council, [facility] [address] [place], [place] [address] [phone]

## 2015-11-23 ENCOUNTER — Ambulatory Visit: Payer: PPO | Admitting: Family Medicine

## 2015-11-23 VITALS — BP 154/81 | HR 70 | Ht 72.0 in | Wt 194.8 lb

## 2015-11-23 DIAGNOSIS — R3915 Urgency of urination: Secondary | ICD-10-CM | POA: Diagnosis not present

## 2015-11-23 DIAGNOSIS — N390 Urinary tract infection, site not specified: Secondary | ICD-10-CM | POA: Diagnosis not present

## 2015-11-23 LAB — URINALYSIS, COMPLETE
BILIRUBIN UA: NEGATIVE
GLUCOSE, UA: NEGATIVE
KETONES UA: NEGATIVE
NITRITE UA: NEGATIVE
Protein, UA: NEGATIVE
SPEC GRAV UA: 1.01 (ref 1.005–1.030)
UUROB: 0.2 mg/dL (ref 0.2–1.0)
pH, UA: 6 (ref 5.0–7.5)

## 2015-11-23 LAB — MICROSCOPIC EXAMINATION
Bacteria, UA: NONE SEEN
Epithelial Cells (non renal): NONE SEEN /hpf (ref 0–10)

## 2015-11-23 NOTE — Progress Notes (Signed)
Patient present today with urinary frequency and urgency. He has not been on ABX in the last 30 days, is not on any suppressive ABX he also has not had a urological surgery in the last 30 days.

## 2015-11-26 LAB — CULTURE, URINE COMPREHENSIVE

## 2015-11-27 ENCOUNTER — Ambulatory Visit: Payer: PPO | Admitting: Urology

## 2015-11-30 ENCOUNTER — Ambulatory Visit (INDEPENDENT_AMBULATORY_CARE_PROVIDER_SITE_OTHER): Payer: PPO | Admitting: Urology

## 2015-11-30 ENCOUNTER — Encounter: Payer: Self-pay | Admitting: Urology

## 2015-11-30 VITALS — BP 130/85 | HR 86 | Ht 71.75 in | Wt 190.8 lb

## 2015-11-30 DIAGNOSIS — C679 Malignant neoplasm of bladder, unspecified: Secondary | ICD-10-CM | POA: Diagnosis not present

## 2015-11-30 LAB — URINALYSIS, COMPLETE
BILIRUBIN UA: NEGATIVE
Glucose, UA: NEGATIVE
Ketones, UA: NEGATIVE
Nitrite, UA: NEGATIVE
PH UA: 7 (ref 5.0–7.5)
PROTEIN UA: NEGATIVE
Specific Gravity, UA: 1.015 (ref 1.005–1.030)
UUROB: 0.2 mg/dL (ref 0.2–1.0)

## 2015-11-30 LAB — MICROSCOPIC EXAMINATION: EPITHELIAL CELLS (NON RENAL): NONE SEEN /HPF (ref 0–10)

## 2015-11-30 MED ORDER — LIDOCAINE HCL 2 % EX GEL
1.0000 "application " | Freq: Once | CUTANEOUS | Status: AC
  Administered 2015-11-30: 1 via URETHRAL

## 2015-11-30 MED ORDER — BCG LIVE 50 MG IS SUSR
3.2400 mL | Freq: Once | INTRAVESICAL | Status: AC
  Administered 2015-11-30: 81 mg via INTRAVESICAL

## 2015-11-30 NOTE — Progress Notes (Signed)
9:22 AM  11/30/15   Anthony Gonzales 1931-06-26 [id number]  Referring provider: Idelle Crouch, MD [facility] [place], [place] [address]  Chief Complaint  Patient presents with  . Bladder Cancer    BCG    HPI: Patient is an 82 year old Caucasian male who presents today for his 6th instillation of a 6 weeks induction course of BCG.  Background history  82 year old male who initially presented with asymptomatic incidental moderate right hydroureteronephrosis in 12/2014.   He is taken to the OR on 01/30/2015 at which time right ureteroscopy revealed as suspicious area within the right distal ureter with a shaggy, irregular appearance but no obvious papillary or pedunculated tumor. Brush cytology was negative for any malignancy.   Ureteral biopsy revealed some atypia favoring neoplasm but essentially was non-diagnostic.   Interval follow-up renal ultrasound on 05/02/2015 did show complete resolution of his hydronephrosis.  He was counseled to return for a second look but deferred this for ~6 months.  He eventually return to the OR on 08/20/15 at which time the right distal ureteral was highly suspicious for papillary urothelial carcinoma was biopsied. 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   Most recently, he returned to the operating room on 09/12/2015 for a second look ureteroscopy/biopsy and TURBT. Given the depth of resection, mitomycin was not administered. Foley catheter remained in place for 1 week. His catheter was removed today. Pathology shows no residual tumor in the bladder or the distal ureter. A Bard Optima stent was replaced at the time of the procedure.  Never smoker.    He does have baseline chronic renal insufficiency.  Cr 1.6-2.   He also has a history of recurrent nephrolithiasis  although no stone seen on CT scan as of 12/2014.  He is currently on Flomax with improvement in his urinary symptoms.  He has a history of post op urinary retention and knows how to CIC as needed.  Today, his complaints today are nocturia, intermittency and a weak urinary stream.  He does not report gross hematuria, dysuria, suprapubic pain.  He has not had any flank pain.  He does not have a fever, chills, nausea or vomiting.  He does not have a cough.  UA today noted > 30RBC's/hpf and 3 -10 RBC's.  This is baseline.  His recent urine culture was negative.  He was able to hold the last instillation for 2 hours.    PMH: Past Medical History:  Diagnosis Date  . Arthritis   . Benign fibroma of prostate 08/23/2013  . BPH (benign prostatic hyperplasia)   . Calculus of kidney 08/23/2013  . Cancer ([place])    skin  . Glaucoma    no drops in 3 mo pressure good, pt denies glaucoma, eye pressure has been measuring alright.  [name] HLD (hyperlipidemia)   . HTN (hypertension) 12/26/2014  . Hypertension   . Hyponatremia 12/26/2014  . Migraines    history of migraines when he was younger.  [name] Restless leg syndrome   . UTI (lower urinary tract infection) 12/26/2014  . Vertigo     Surgical History: Past Surgical History:  Procedure Laterality Date  . COLONOSCOPY    . CYSTOSCOPY W/ RETROGRADES Right 01/30/2015   Procedure: CYSTOSCOPY WITH RETROGRADE PYELOGRAM;  Surgeon: [name], MD;  Location: [facility];  Service: Urology;  Laterality: Right;  . CYSTOSCOPY W/ URETERAL STENT PLACEMENT Right 08/20/2015   Procedure: CYSTOSCOPY WITH RETROGRADE PYELOGRAM/POSSIBLE URETERAL STENT PLACEMENT/BLADDER BIOPSY;  Surgeon: [name], MD;  Location: [facility];  Service: Urology;  Laterality: Right;  . CYSTOSCOPY W/ URETERAL STENT PLACEMENT Right 09/12/2015   Procedure: CYSTOSCOPY WITH STENT REPLACEMENT;  Surgeon: [name], MD;  Location: [facility];  Service: Urology;  Laterality: Right;  . CYSTOSCOPY WITH  BIOPSY Right 09/12/2015   Procedure: CYSTOSCOPY WITH BLADDER AND URETERAL BIOPSY;  Surgeon: [name], MD;  Location: [facility];  Service: Urology;  Laterality: Right;  . CYSTOSCOPY WITH STENT PLACEMENT Right 01/30/2015   Procedure: CYSTOSCOPY WITH STENT PLACEMENT;  Surgeon: [name], MD;  Location: [facility];  Service: Urology;  Laterality: Right;  . EYE SURGERY Bilateral    Cataract Extraction with IOL  . HOLMIUM LASER APPLICATION N/A 03/15/2692   Procedure:  HOLMIUM LASER APPLICATION;  Surgeon: [name], MD;  Location: [facility];  Service: Urology;  Laterality: N/A;  . SPERMATOCELECTOMY    . TONSILLECTOMY    . TRANSURETHRAL RESECTION OF BLADDER TUMOR WITH MITOMYCIN-C N/A 09/12/2015   Procedure: TRANSURETHRAL RESECTION OF BLADDER TUMOR ;  Surgeon: [name], MD;  Location: [facility];  Service: Urology;  Laterality: N/A;  . URETEROSCOPY Right 01/30/2015   Procedure: URETEROSCOPY/ WITH BIOPSY AND CYTOLOGY BRUSHING;  Surgeon: [name], MD;  Location: [facility];  Service: Urology;  Laterality: Right;  . URETEROSCOPY Right 08/20/2015   Procedure: URETEROSCOPY;  Surgeon: [name], MD;  Location: [facility];  Service: Urology;  Laterality: Right;  . URETEROSCOPY Right 09/12/2015   Procedure: URETEROSCOPY;  Surgeon: [name], MD;  Location: [facility];  Service: Urology;  Laterality: Right;    Home Medications:    Medication List       Accurate as of 11/30/15  9:22 AM. Always use your most recent med list.          aspirin EC 81 MG tablet Take 81 mg by mouth. At supper.   benazepril-hydrochlorthiazide 20-12.5 MG tablet Commonly known as:  LOTENSIN HCT Take 1 tablet by mouth daily. In am.   docusate sodium 100 MG capsule Commonly known as:  COLACE Take 1 capsule (100 mg total) by mouth 2 (two) times daily.   DYMISTA 137-50 MCG/ACT Susp Generic drug:  Azelastine-Fluticasone PLACE 1 SPRAY INTO BOTH NOSTRILS 2 (TWO) TIMES DAILY.   fenofibrate micronized 134 MG  capsule Commonly known as:  LOFIBRA Take 134 mg by mouth daily before breakfast.   Fish Oil 1200 MG Caps Take 1 mg by mouth 2 (two) times daily.   HYDROcodone-acetaminophen 5-325 MG tablet Commonly known as:  NORCO/VICODIN Take 1-2 tablets by mouth every 6 (six) hours as needed for moderate pain.   multivitamin capsule Take 1 capsule by mouth every morning.   oxybutynin 5 MG tablet Commonly known as:  DITROPAN Take 1 tablet (5 mg total) by mouth every 8 (eight) hours as needed for bladder spasms.   tamsulosin 0.4 MG Caps capsule Commonly known as:  FLOMAX TAKE ONE CAPSULE BY MOUTH DAILY   TGT PSYLLIUM FIBER 0.52 g capsule Generic drug:  psyllium Take 0.52 g by mouth as needed.       Allergies:  Allergies  Allergen Reactions  . Demerol [Meperidine] Nausea And Vomiting    Other reaction(s): Unknown Other reaction(s): Unknown  . Lipitor [Atorvastatin] Swelling  . Septra [Sulfamethoxazole-Trimethoprim] Nausea And Vomiting    Per patient "this medication made me feel worse  since starting this antibiotic".    . Sulfa Antibiotics Rash    Family History: Family History  Problem Relation Age of Onset  . Hypertension Mother   . Hypertension Father   . Prostate cancer Brother   . Kidney disease Neg Hx     Social History:  reports that he has never smoked. He has never used smokeless tobacco. He reports that he does not drink alcohol or use drugs.   Physical Exam: BP 130/85   Pulse 86   Ht 5' 11.75" (1.822 m)   Wt 190 lb 12.8 oz (86.5 kg)   BMI 26.06 kg/m   Constitutional:  Alert and oriented, No acute distress.  HEENT: [place] AT, moist mucus membranes.  Trachea midline, no masses. Cardiovascular: No clubbing, cyanosis, or edema.   Respiratory: Normal respiratory effort, no increased work of breathing.  Skin: No rashes, bruises or suspicious lesions. Neurologic: Grossly intact, no focal deficits, moving all 4 extremities. Psychiatric: Normal mood and  affect.  Laboratory Data: Lab Results  Component Value Date   WBC 7.0 02/04/2015   HGB 13.0 02/04/2015   HCT 39.1 (L) 02/04/2015   MCV 88.4 02/04/2015   PLT 235 02/04/2015    Lab Results  Component Value Date   CREATININE 1.27 (H) 08/10/2015   Urinalysis > 30 WBC's/hpf.  3-10 RBC's/hpf.  See EPIC.    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 Given multifocal high-grade tumor along with ureteral lesion, I have recommended consideration of BCG therapy.   - # 6 of 6 BCG given today  - patient instructed to do rotation at home  - instructed to pour bleach in the toilet after urination for 6 hours of the day of treatment  - advised to contact our office or seek treatment in the ED if becomes febrile, chills, vomiting, night sweats/new cough in order to arrange for emergent/urgent intervention  - RTC for appointment with [name]  - cystoscopy in 3 months after completion of BCG in the OR  2. Right distal ureteral cancer Second look ureteroscopy revealed no obvious residual tumor. Biopsies were negative. Given the concern for CIS/distal ureteral carcinoma, options were reviewed. This included nephroureteral rectum E, distal ureterectomy with reimplant, and the possibility of BCG treatment with a stent in place to allow for reflux of this medication into the distal ureter. With the benefits of each of their efficacy rates were discussed. There is limited data on BCG therapy for ureteral cancer, but theoretically when extrapolated from bladder cancer data, it should be efficacious.    BCG therapy with plans for return to the OR in 3 months for repeat cystoscopy, ureteroscopy, possible biopsies, and stent removal.   - see above  3. History of nephrolithiasis Currently stone free.   4. Nocturia/ history of retention Continue Flomax PVR minimal Patient advised to let us know if he is unable to void today. If this is  the case, he may resume clean intermittent catheterization.  Return for keep follow up appoinment with [name] on 12/21/2015 at 10:45am.   Zara Council, [facility] [address], [place], [place] [address] [phone]

## 2015-11-30 NOTE — Progress Notes (Signed)
BCG Bladder Instillation  BCG # 6 Due to Bladder Cancer patient is present today for a BCG treatment. Patient was cleaned and prepped in a sterile fashion with betadine and lidocaine 2% jelly was instilled into the urethra.  A 14FR catheter was inserted, urine return was noted 148m, urine was yellow in color.  531mof reconstituted BCG was instilled into the bladder. The catheter was then removed. Patient tolerated well, no complications were noted.  Preformed by: ShZara CouncilA-C and RaLyndee HensenMA  Follow up/ Additional notes: Three months for cystoscopy

## 2015-12-04 DIAGNOSIS — Z85828 Personal history of other malignant neoplasm of skin: Secondary | ICD-10-CM | POA: Diagnosis not present

## 2015-12-04 DIAGNOSIS — C4442 Squamous cell carcinoma of skin of scalp and neck: Secondary | ICD-10-CM | POA: Diagnosis not present

## 2015-12-04 DIAGNOSIS — L570 Actinic keratosis: Secondary | ICD-10-CM | POA: Diagnosis not present

## 2015-12-04 DIAGNOSIS — Z08 Encounter for follow-up examination after completed treatment for malignant neoplasm: Secondary | ICD-10-CM | POA: Diagnosis not present

## 2015-12-04 DIAGNOSIS — D485 Neoplasm of uncertain behavior of skin: Secondary | ICD-10-CM | POA: Diagnosis not present

## 2015-12-04 DIAGNOSIS — X32XXXA Exposure to sunlight, initial encounter: Secondary | ICD-10-CM | POA: Diagnosis not present

## 2015-12-05 ENCOUNTER — Telehealth: Payer: Self-pay

## 2015-12-05 NOTE — Telephone Encounter (Signed)
Pt called stating he is having a hard time starting his urine stream. Pt stated that last Friday when he was in for a BCG tx someone mentioned to him about a medication to shrink his prostate. Pt requested to start medication. Please advise.

## 2015-12-05 NOTE — Telephone Encounter (Signed)
Spoke with pt in reference to finasteride and BCG tx. Offered for pt to come in for a bladder scan. Pt denied nurse visit. Pt stated that he can urinate just has trouble getting started and it burns. Reinforced with pt to drink plenty of fluids. Pt also denied starting finasteride due to the delayed affects of medication working. Pt stated that he will call back once he returns from the beach.

## 2015-12-05 NOTE — Telephone Encounter (Signed)
That is fine to start the finasteride, but it will not take effect for several weeks.  If he is having a hard time emptying his bladder, he might want to come in and have his bladder scanned.

## 2015-12-19 DIAGNOSIS — E782 Mixed hyperlipidemia: Secondary | ICD-10-CM | POA: Diagnosis not present

## 2015-12-19 DIAGNOSIS — Z23 Encounter for immunization: Secondary | ICD-10-CM | POA: Diagnosis not present

## 2015-12-19 DIAGNOSIS — I10 Essential (primary) hypertension: Secondary | ICD-10-CM | POA: Diagnosis not present

## 2015-12-19 DIAGNOSIS — Z125 Encounter for screening for malignant neoplasm of prostate: Secondary | ICD-10-CM | POA: Diagnosis not present

## 2015-12-19 DIAGNOSIS — Z79899 Other long term (current) drug therapy: Secondary | ICD-10-CM | POA: Diagnosis not present

## 2015-12-21 ENCOUNTER — Ambulatory Visit (INDEPENDENT_AMBULATORY_CARE_PROVIDER_SITE_OTHER): Payer: PPO | Admitting: Urology

## 2015-12-21 ENCOUNTER — Encounter: Payer: Self-pay | Admitting: Urology

## 2015-12-21 VITALS — BP 138/82 | HR 66 | Ht 71.0 in | Wt 194.6 lb

## 2015-12-21 DIAGNOSIS — N200 Calculus of kidney: Secondary | ICD-10-CM

## 2015-12-21 DIAGNOSIS — C679 Malignant neoplasm of bladder, unspecified: Secondary | ICD-10-CM | POA: Diagnosis not present

## 2015-12-21 DIAGNOSIS — N289 Disorder of kidney and ureter, unspecified: Secondary | ICD-10-CM

## 2015-12-21 DIAGNOSIS — R351 Nocturia: Secondary | ICD-10-CM

## 2015-12-21 DIAGNOSIS — N2889 Other specified disorders of kidney and ureter: Secondary | ICD-10-CM

## 2015-12-21 LAB — URINALYSIS, COMPLETE
BILIRUBIN UA: NEGATIVE
Glucose, UA: NEGATIVE
KETONES UA: NEGATIVE
Nitrite, UA: NEGATIVE
PROTEIN UA: NEGATIVE
SPEC GRAV UA: 1.01 (ref 1.005–1.030)
Urobilinogen, Ur: 0.2 mg/dL (ref 0.2–1.0)
pH, UA: 7 (ref 5.0–7.5)

## 2015-12-21 LAB — BLADDER SCAN AMB NON-IMAGING: Scan Result: 48

## 2015-12-21 LAB — MICROSCOPIC EXAMINATION
EPITHELIAL CELLS (NON RENAL): NONE SEEN /HPF (ref 0–10)
WBC, UA: >30 /hpf — AB (ref 0–?)

## 2015-12-21 MED ORDER — CIPROFLOXACIN HCL 500 MG PO TABS
500.0000 mg | ORAL_TABLET | Freq: Once | ORAL | Status: AC
Start: 2015-12-21 — End: 2015-12-21
  Administered 2015-12-21: 500 mg via ORAL

## 2015-12-21 MED ORDER — LIDOCAINE HCL 2 % EX GEL
1.0000 "application " | Freq: Once | CUTANEOUS | Status: AC
  Administered 2015-12-21: 1 via URETHRAL

## 2015-12-23 NOTE — Progress Notes (Signed)
2:55 PM  12/21/15  Anthony Gonzales 09/07/1931 [id number]  Referring provider: Idelle Crouch, MD [facility] [place], [place] [address]  Chief Complaint  Patient presents with  . Cysto    HPI:  82 year old male who initially presented with asymptomatic incidental moderate right hydroureteronephrosis in 12/2014.  He is taken to the OR on 01/30/2015 at which time right ureteroscopy revealed as suspicious area within the right distal ureter with a shaggy, irregular appearance but no obvious papillary or pedunculated tumor. Brush cytology was negative for any malignancy.   Ureteral biopsy revealed some atypia favoring neoplasm but essentially was non-diagnostic.   Interval follow-up renal ultrasound on 05/02/2015 did show complete resolution of his hydronephrosis.  He was counseled to return for a second look but deferred this for ~6 months.  He eventually return to the OR on 08/20/15 at which time the right distal ureteral was highly suspicious for papillary urothelial carcinoma was was biopsied. 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   He returned to operating room on 09/12/2015 for a second look ureteroscopy/biopsy and TURBT. Given the depth of resection, mitomycin was not administered.  Pathology showed no residual tumor in the bladder or the distal ureter. A Bard Optima stent was replaced at the time of the procedure.  He since completed an induction course of BCG completed on 11/30/2015 6 doses. This was performed with the stent in place.  Never smoker.     He does have baseline chronic renal insufficiency.  Cr 1.6-2.   He also has a history of recurrent nephrolithiasis although no stone seen on CT scan as of 12/2014.  He returns to the office today for cystoscopy, stent removal. He does endorse some  sensation of incomplete bladder emptying, postvoid residual today was minimal. He is history of BPH and postop urinary retention, occasionally CIC's as needed.   PMH: Past Medical History:  Diagnosis Date  . Arthritis   . Benign fibroma of prostate 08/23/2013  . BPH (benign prostatic hyperplasia)   . Calculus of kidney 08/23/2013  . Cancer ([place])    skin  . Glaucoma    no drops in 3 mo pressure good, pt denies glaucoma, eye pressure has been measuring alright.  [name] HLD (hyperlipidemia)   . HTN (hypertension) 12/26/2014  . Hypertension   . Hyponatremia 12/26/2014  . Migraines    history of migraines when he was younger.  [name] Restless leg syndrome   . UTI (lower urinary tract infection) 12/26/2014  . Vertigo     Surgical History: Past Surgical History:  Procedure Laterality Date  . COLONOSCOPY    . CYSTOSCOPY W/ RETROGRADES Right 01/30/2015   Procedure: CYSTOSCOPY WITH RETROGRADE PYELOGRAM;  Surgeon: [name], MD;  Location: [facility];  Service: Urology;  Laterality: Right;  . CYSTOSCOPY W/ URETERAL STENT PLACEMENT Right 08/20/2015   Procedure: CYSTOSCOPY WITH RETROGRADE PYELOGRAM/POSSIBLE URETERAL STENT PLACEMENT/BLADDER BIOPSY;  Surgeon: [name], MD;  Location: [facility];  Service: Urology;  Laterality: Right;  . CYSTOSCOPY W/ URETERAL STENT PLACEMENT Right 09/12/2015   Procedure: CYSTOSCOPY WITH STENT REPLACEMENT;  Surgeon: [name], MD;  Location: [facility];  Service: Urology;  Laterality: Right;  . CYSTOSCOPY WITH BIOPSY Right 09/12/2015   Procedure: CYSTOSCOPY WITH BLADDER AND URETERAL BIOPSY;  Surgeon: [name], MD;  Location: [facility];  Service:  Urology;  Laterality: Right;  . CYSTOSCOPY WITH STENT PLACEMENT Right 01/30/2015   Procedure: CYSTOSCOPY WITH STENT PLACEMENT;  Surgeon: [name], MD;  Location: [facility];  Service: Urology;  Laterality: Right;  . EYE SURGERY Bilateral    Cataract Extraction with IOL  . HOLMIUM LASER APPLICATION N/A 06/03/7122    Procedure:  HOLMIUM LASER APPLICATION;  Surgeon: [name], MD;  Location: [facility];  Service: Urology;  Laterality: N/A;  . SPERMATOCELECTOMY    . TONSILLECTOMY    . TRANSURETHRAL RESECTION OF BLADDER TUMOR WITH MITOMYCIN-C N/A 09/12/2015   Procedure: TRANSURETHRAL RESECTION OF BLADDER TUMOR ;  Surgeon: [name], MD;  Location: [facility];  Service: Urology;  Laterality: N/A;  . URETEROSCOPY Right 01/30/2015   Procedure: URETEROSCOPY/ WITH BIOPSY AND CYTOLOGY BRUSHING;  Surgeon: [name], MD;  Location: [facility];  Service: Urology;  Laterality: Right;  . URETEROSCOPY Right 08/20/2015   Procedure: URETEROSCOPY;  Surgeon: [name], MD;  Location: [facility];  Service: Urology;  Laterality: Right;  . URETEROSCOPY Right 09/12/2015   Procedure: URETEROSCOPY;  Surgeon: [name], MD;  Location: [facility];  Service: Urology;  Laterality: Right;    Home Medications:    Medication List       Accurate as of 12/21/15 11:59 PM. Always use your most recent med list.          aspirin EC 81 MG tablet Take 81 mg by mouth. At supper.   benazepril-hydrochlorthiazide 20-12.5 MG tablet Commonly known as:  LOTENSIN HCT Take 1 tablet by mouth daily. In am.   docusate sodium 100 MG capsule Commonly known as:  COLACE Take 1 capsule (100 mg total) by mouth 2 (two) times daily.   DYMISTA 137-50 MCG/ACT Susp Generic drug:  Azelastine-Fluticasone PLACE 1 SPRAY INTO BOTH NOSTRILS 2 (TWO) TIMES DAILY.   fenofibrate micronized 134 MG capsule Commonly known as:  LOFIBRA Take 134 mg by mouth daily before breakfast.   Fish Oil 1200 MG Caps Take 1 mg by mouth 2 (two) times daily.   HYDROcodone-acetaminophen 5-325 MG tablet Commonly known as:  NORCO/VICODIN Take 1-2 tablets by mouth every 6 (six) hours as needed for moderate pain.   multivitamin capsule Take 1 capsule by mouth every morning.   oxybutynin 5 MG tablet Commonly known as:  DITROPAN Take 1 tablet (5 mg total) by mouth  every 8 (eight) hours as needed for bladder spasms.   tamsulosin 0.4 MG Caps capsule Commonly known as:  FLOMAX TAKE ONE CAPSULE BY MOUTH DAILY   TGT PSYLLIUM FIBER 0.52 g capsule Generic drug:  psyllium Take 0.52 g by mouth as needed.       Allergies:  Allergies  Allergen Reactions  . Demerol [Meperidine] Nausea And Vomiting    Other reaction(s): Unknown Other reaction(s): Unknown  . Lipitor [Atorvastatin] Swelling  . Septra [Sulfamethoxazole-Trimethoprim] Nausea And Vomiting    Per patient "this medication made me feel worse since starting this antibiotic".    . Sulfa Antibiotics Rash    Family History: Family History  Problem Relation Age of Onset  . Hypertension Mother   . Hypertension Father   . Prostate cancer Brother   . Kidney disease Neg Hx     Social History:  reports that he has never smoked. He has never used smokeless tobacco. He reports that he does not drink alcohol or use drugs.   Physical Exam: BP 138/82   Pulse 66   Ht '5\' 11"'$  (1.803 m)   Wt 194 lb 9.6  oz (88.3 kg)   BMI 27.14 kg/m   Constitutional:  Alert and oriented, No acute distress.   HEENT: [place] AT, moist mucus membranes.  Trachea midline, no masses. Cardiovascular: No clubbing, cyanosis, or edema.   Respiratory: Normal respiratory effort, no increased work of breathing.  GU: Normal phallus with was the topic meatus, widely patent Skin: No rashes, bruises or suspicious lesions. Neurologic: Grossly intact, no focal deficits, moving all 4 extremities. Psychiatric: Normal mood and affect.  Laboratory Data: Lab Results  Component Value Date   WBC 7.0 02/04/2015   HGB 13.0 02/04/2015   HCT 39.1 (L) 02/04/2015   MCV 88.4 02/04/2015   PLT 235 02/04/2015    Lab Results  Component Value Date   CREATININE 1.27 (H) 08/10/2015    Cystoscopy/ Stent removal procedure  Patient identification was confirmed, informed consent was obtained, and patient was prepped using Betadine solution.   Lidocaine jelly was administered per urethral meatus.    Preoperative abx where received prior to procedure.    Procedure: - Flexible cystoscope introduced, without any difficulty.   - Thorough search of the bladder revealed:    normal urethral meatus  Stent seen emanating from right ureteral orifice, grasped with stent graspers, and removed in entirety.     Moderate debris in bladder limiting visualization.  Post-Procedure: - Patient tolerated the procedure well   Assessment & Plan:    1. 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 Right distal ureteral cancer Previously no interested in distal ureterectomy/ nephrou S/p multiple biopsies/ laser ablation with stent/ BCG course Stent removed today Recommended RUS in 4-6 weeks to assess for hydro/ distal ureteral scarring/ recurrent tumor obstruction Likely recommended return to OR for URS/ RTG/ possible biospy after next cyst in 6 weeks  3. History of nephrolithiasis Currently stone free.    4. Nocturia/ history of retention/ BPH with sensation of incomplete bladder emtying Continue Flomax CIC as needed PVR minimal  Return in about 6 weeks (around 02/01/2016) for cystsocopy, f/u RUS.   [name], MD  [facility] [address] [place],  [address] [phone]

## 2015-12-24 LAB — CULTURE, URINE COMPREHENSIVE

## 2015-12-25 ENCOUNTER — Telehealth: Payer: Self-pay

## 2015-12-25 DIAGNOSIS — N390 Urinary tract infection, site not specified: Secondary | ICD-10-CM

## 2015-12-25 DIAGNOSIS — C4442 Squamous cell carcinoma of skin of scalp and neck: Secondary | ICD-10-CM | POA: Diagnosis not present

## 2015-12-25 MED ORDER — SULFAMETHOXAZOLE-TRIMETHOPRIM 800-160 MG PO TABS: 1.0000 | ORAL_TABLET | Freq: Two times a day (BID) | ORAL | 0 refills | Status: AC

## 2015-12-25 NOTE — Telephone Encounter (Signed)
LMOM

## 2015-12-25 NOTE — Telephone Encounter (Signed)
-----   Message from [name], MD sent at 12/25/2015  2:13 PM EDT ----- Please treat this patient for UTI, Bactrim DS twice a day 7 days.  [name], MD

## 2015-12-25 NOTE — Telephone Encounter (Signed)
Pt called back and I read message about UTI and told him to get Bactrim from pharmacy.

## 2015-12-26 DIAGNOSIS — H35373 Puckering of macula, bilateral: Secondary | ICD-10-CM | POA: Diagnosis not present

## 2015-12-26 DIAGNOSIS — Z79899 Other long term (current) drug therapy: Secondary | ICD-10-CM | POA: Diagnosis not present

## 2015-12-26 DIAGNOSIS — Z125 Encounter for screening for malignant neoplasm of prostate: Secondary | ICD-10-CM | POA: Diagnosis not present

## 2015-12-26 DIAGNOSIS — E782 Mixed hyperlipidemia: Secondary | ICD-10-CM | POA: Diagnosis not present

## 2015-12-26 MED ORDER — CIPROFLOXACIN HCL 500 MG PO TABS: 500.0000 mg | ORAL_TABLET | Freq: Two times a day (BID) | ORAL | 0 refills | Status: DC

## 2015-12-26 NOTE — Telephone Encounter (Signed)
Pt called this morning stating that he is not able to take bactrim due to side effects. Pt described side effects to be diarrhea and vomiting. Please advise.

## 2015-12-26 NOTE — Telephone Encounter (Signed)
Spoke with pt in reference to new abx, cipro. Made aware medication has been sent to pharmacy. Pt voiced understanding.

## 2015-12-26 NOTE — Telephone Encounter (Signed)
Change to cipro 500 mg bid x 7 day  [name], MD

## 2016-01-03 DIAGNOSIS — H26492 Other secondary cataract, left eye: Secondary | ICD-10-CM | POA: Diagnosis not present

## 2016-01-30 ENCOUNTER — Ambulatory Visit
Admission: RE | Admit: 2016-01-30 | Discharge: 2016-01-30 | Disposition: A | Discharge status: Home / Self Care | Payer: PPO | Source: Ambulatory Visit | Attending: Urology | Admitting: Urology

## 2016-01-30 DIAGNOSIS — C661 Malignant neoplasm of right ureter: Secondary | ICD-10-CM | POA: Diagnosis not present

## 2016-01-30 DIAGNOSIS — C679 Malignant neoplasm of bladder, unspecified: Secondary | ICD-10-CM | POA: Diagnosis not present

## 2016-02-06 ENCOUNTER — Ambulatory Visit: Payer: PPO | Admitting: Urology

## 2016-02-06 ENCOUNTER — Other Ambulatory Visit: Payer: Self-pay | Admitting: Radiology

## 2016-02-06 ENCOUNTER — Telehealth: Payer: Self-pay | Admitting: Radiology

## 2016-02-06 ENCOUNTER — Encounter: Payer: Self-pay | Admitting: Urology

## 2016-02-06 VITALS — BP 131/82 | HR 70 | Ht 72.0 in | Wt 193.6 lb

## 2016-02-06 DIAGNOSIS — C679 Malignant neoplasm of bladder, unspecified: Secondary | ICD-10-CM

## 2016-02-06 DIAGNOSIS — C661 Malignant neoplasm of right ureter: Secondary | ICD-10-CM

## 2016-02-06 DIAGNOSIS — R319 Hematuria, unspecified: Secondary | ICD-10-CM | POA: Diagnosis not present

## 2016-02-06 DIAGNOSIS — N3289 Other specified disorders of bladder: Secondary | ICD-10-CM | POA: Diagnosis not present

## 2016-02-06 DIAGNOSIS — Z87442 Personal history of urinary calculi: Secondary | ICD-10-CM

## 2016-02-06 LAB — URINALYSIS, COMPLETE
Bilirubin, UA: NEGATIVE
GLUCOSE, UA: NEGATIVE
Ketones, UA: NEGATIVE
LEUKOCYTES UA: NEGATIVE
Nitrite, UA: NEGATIVE
PH UA: 7 (ref 5.0–7.5)
PROTEIN UA: NEGATIVE
Specific Gravity, UA: 1.01 (ref 1.005–1.030)
UUROB: 0.2 mg/dL (ref 0.2–1.0)

## 2016-02-06 LAB — MICROSCOPIC EXAMINATION
BACTERIA UA: NONE SEEN
Epithelial Cells (non renal): NONE SEEN /hpf (ref 0–10)

## 2016-02-06 MED ORDER — CIPROFLOXACIN HCL 500 MG PO TABS
500.0000 mg | ORAL_TABLET | Freq: Once | ORAL | Status: AC
  Administered 2016-02-06: 500 mg via ORAL

## 2016-02-06 MED ORDER — LIDOCAINE HCL 2 % EX GEL
1.0000 "application " | Freq: Once | CUTANEOUS | Status: AC
  Administered 2016-02-06: 1 via URETHRAL

## 2016-02-06 NOTE — Telephone Encounter (Signed)
Notified pt of surgery scheduled with Dr Erlene Quan on 02/26/16, pre-admit testing appt on 02/15/16 '@9'$ :45 & to call day prior to surgery for arrival time to SDS. Pt voices understanding.

## 2016-02-10 NOTE — Progress Notes (Signed)
2:46 PM  02/06/16  Anthony Gonzales 06/22/1931 [id number]  Referring provider: Idelle Crouch, MD [facility] [place], [place] [address]  Chief Complaint  Patient presents with  . Cysto    HPI:  82 year old male who initially presented with asymptomatic incidental moderate right hydroureteronephrosis in 12/2014.  He is taken to the OR on 01/30/2015 at which time right ureteroscopy revealed as suspicious area within the right distal ureter with a shaggy, irregular appearance but no obvious papillary or pedunculated tumor. Brush cytology was negative for any malignancy.   Ureteral biopsy revealed some atypia favoring neoplasm but essentially was non-diagnostic.   Interval follow-up renal ultrasound on 05/02/2015 did show complete resolution of his hydronephrosis.  He was counseled to return for a second look but deferred this for ~6 months.  He eventually return to the OR on 08/20/15 at which time the right distal ureteral was highly suspicious for papillary urothelial carcinoma was was biopsied. 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   He returned to operating room on 09/12/2015 for a second look ureteroscopy/biopsy and TURBT. Given the depth of resection, mitomycin was not administered.  Pathology showed no residual tumor in the bladder or the distal ureter. A Bard Optima stent was replaced at the time of the procedure.  He completed an induction course of BCG completed on 11/30/2015 6 doses. This was performed with the stent in place.  The stent was subsequently removed.  Never smoker.     He does have baseline chronic renal insufficiency.  Cr 1.6-2.   He also has a history of recurrent nephrolithiasis although no stone seen on CT scan as of 12/2014.  He is history of BPH and postop urinary retention,  occasionally CIC's as needed.  He returns to the office today for surveillance cystoscopy.  Most recent renal ultrasound on 01/30/2016 shows no evidence of hydronephrosis bilaterally.   PMH: Past Medical History:  Diagnosis Date  . Arthritis   . Benign fibroma of prostate 08/23/2013  . BPH (benign prostatic hyperplasia)   . Calculus of kidney 08/23/2013  . Cancer ([place])    skin  . Glaucoma    no drops in 3 mo pressure good, pt denies glaucoma, eye pressure has been measuring alright.  [name] HLD (hyperlipidemia)   . HTN (hypertension) 12/26/2014  . Hypertension   . Hyponatremia 12/26/2014  . Migraines    history of migraines when he was younger.  [name] Restless leg syndrome   . UTI (lower urinary tract infection) 12/26/2014  . Vertigo     Surgical History: Past Surgical History:  Procedure Laterality Date  . COLONOSCOPY    . CYSTOSCOPY W/ RETROGRADES Right 01/30/2015   Procedure: CYSTOSCOPY WITH RETROGRADE PYELOGRAM;  Surgeon: [name], MD;  Location: [facility];  Service: Urology;  Laterality: Right;  . CYSTOSCOPY W/ URETERAL STENT PLACEMENT Right 08/20/2015   Procedure: CYSTOSCOPY WITH RETROGRADE PYELOGRAM/POSSIBLE URETERAL STENT PLACEMENT/BLADDER BIOPSY;  Surgeon: [name], MD;  Location: [facility];  Service: Urology;  Laterality: Right;  . CYSTOSCOPY W/ URETERAL STENT PLACEMENT Right 09/12/2015   Procedure: CYSTOSCOPY WITH STENT REPLACEMENT;  Surgeon: [name], MD;  Location: [facility];  Service: Urology;  Laterality: Right;  . CYSTOSCOPY WITH BIOPSY Right 09/12/2015   Procedure: CYSTOSCOPY WITH BLADDER AND URETERAL BIOPSY;  Surgeon: [name], MD;  Location:  [facility];  Service: Urology;  Laterality: Right;  . CYSTOSCOPY WITH STENT PLACEMENT Right 01/30/2015   Procedure: CYSTOSCOPY WITH STENT PLACEMENT;  Surgeon: [name], MD;  Location: [facility];  Service: Urology;  Laterality: Right;  . EYE SURGERY Bilateral    Cataract Extraction with IOL  . HOLMIUM LASER  APPLICATION N/A 8/41/6606   Procedure:  HOLMIUM LASER APPLICATION;  Surgeon: [name], MD;  Location: [facility];  Service: Urology;  Laterality: N/A;  . SPERMATOCELECTOMY    . TONSILLECTOMY    . TRANSURETHRAL RESECTION OF BLADDER TUMOR WITH MITOMYCIN-C N/A 09/12/2015   Procedure: TRANSURETHRAL RESECTION OF BLADDER TUMOR ;  Surgeon: [name], MD;  Location: [facility];  Service: Urology;  Laterality: N/A;  . URETEROSCOPY Right 01/30/2015   Procedure: URETEROSCOPY/ WITH BIOPSY AND CYTOLOGY BRUSHING;  Surgeon: [name], MD;  Location: [facility];  Service: Urology;  Laterality: Right;  . URETEROSCOPY Right 08/20/2015   Procedure: URETEROSCOPY;  Surgeon: [name], MD;  Location: [facility];  Service: Urology;  Laterality: Right;  . URETEROSCOPY Right 09/12/2015   Procedure: URETEROSCOPY;  Surgeon: [name], MD;  Location: [facility];  Service: Urology;  Laterality: Right;    Home Medications:    Medication List       Accurate as of 02/06/16 11:59 PM. Always use your most recent med list.          aspirin EC 81 MG tablet Take 81 mg by mouth. At supper.   benazepril-hydrochlorthiazide 20-12.5 MG tablet Commonly known as:  LOTENSIN HCT Take 1 tablet by mouth daily. In am.   ciprofloxacin 500 MG tablet Commonly known as:  CIPRO Take 1 tablet (500 mg total) by mouth every 12 (twelve) hours.   docusate sodium 100 MG capsule Commonly known as:  COLACE Take 1 capsule (100 mg total) by mouth 2 (two) times daily.   DYMISTA 137-50 MCG/ACT Susp Generic drug:  Azelastine-Fluticasone PLACE 1 SPRAY INTO BOTH NOSTRILS 2 (TWO) TIMES DAILY.   fenofibrate micronized 134 MG capsule Commonly known as:  LOFIBRA Take 134 mg by mouth daily before breakfast.   Fish Oil 1200 MG Caps Take 1 mg by mouth 2 (two) times daily.   multivitamin capsule Take 1 capsule by mouth every morning.   tamsulosin 0.4 MG Caps capsule Commonly known as:  FLOMAX TAKE ONE CAPSULE BY MOUTH DAILY     TGT PSYLLIUM FIBER 0.52 g capsule Generic drug:  psyllium Take 0.52 g by mouth as needed.       Allergies:  Allergies  Allergen Reactions  . Demerol [Meperidine] Nausea And Vomiting    Other reaction(s): Unknown Other reaction(s): Unknown  . Lipitor [Atorvastatin] Swelling  . Septra [Sulfamethoxazole-Trimethoprim] Nausea And Vomiting    Per patient "this medication made me feel worse since starting this antibiotic".    . Sulfa Antibiotics Rash    Family History: Family History  Problem Relation Age of Onset  . Hypertension Mother   . Hypertension Father   . Prostate cancer Brother   . Kidney disease Neg Hx     Social History:  reports that he has never smoked. He has never used smokeless tobacco. He reports that he does not drink alcohol or use drugs.   Physical Exam: BP 131/82   Pulse 70   Ht 6' (1.829 m)   Wt 193 lb 9.6 oz (87.8 kg)   BMI 26.26 kg/m   Constitutional:  Alert and oriented, No acute distress.   HEENT: [place] AT, moist mucus membranes.  Trachea midline, no masses. Cardiovascular: No clubbing, cyanosis, or edema.   Respiratory: Normal respiratory effort, no increased work of breathing.  GU: Normal phallus with was the topic meatus, widely patent Skin: No rashes, bruises or suspicious lesions. Neurologic: Grossly intact, no focal deficits, moving all 4 extremities. Psychiatric: Normal mood and affect.  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 No hydronephrosis is noted. There is a  cyst in the lower pole of the right kidney measuring 3.4 cm.  Left Kidney:  Length: 11.5 cm.  No hydronephrosis is seen.  Bladder:  The urinary bladder is not well distended but no abnormality is seen. The prostate is prominent measuring 5.4 x 4.2 x 4.3 cm.  IMPRESSION: 1. No hydronephrosis. 2. Prominent prostate.   Electronically Signed   By: Ivar Drape M.D.   On: 01/30/2016 13:34  RUS reviewed personally today  Cystoscopy Procedure Note  Patient identification was confirmed, informed consent was obtained, and patient was prepped using Betadine solution.  Lidocaine jelly was administered per urethral meatus.    Preoperative abx where received prior to procedure.     Pre-Procedure: - Inspection reveals a normal caliber ureteral meatus.  Procedure: The flexible cystoscope was introduced without difficulty - No urethral strictures/lesions are present. - Normal prostate  - Bladder neck lesion appreciated at ~9 oc lock position - Bilateral ureteral orifices identified - Bladder mucosa  reveals no ulcers, tumors, or lesions - No bladder stones - moderate trabeculation  Retroflexion shows approximately 1 cm nodular mass at the bladder neck around the 9:00 position suspicious for tumor recurrence.   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 Risks and benefits reviewed again in detail. He's previously undergone the procedure and is familiar.  2. Right distal ureteral cancer Previously no interested in distal ureterectomy/ nephrou S/p multiple biopsies/ laser ablation with stent/ BCG course In presence of bladder neck lesion, we will also reinspect right distal ureter in the OR.  Plan for URS/ RTG/ possible biospy.  3. History of nephrolithiasis Currently stone free.     4. Nocturia/ history of retention/ BPH with sensation of incomplete bladder emtying Continue Flomax CIC as needed PVR minimal previously    [name], MD  [facility] [address] [place], [place] [address] [phone]

## 2016-02-15 ENCOUNTER — Encounter
Admission: RE | Admit: 2016-02-15 | Discharge: 2016-02-15 | Disposition: A | Discharge status: Home / Self Care | Payer: PPO | Source: Ambulatory Visit | Attending: Urology | Admitting: Urology

## 2016-02-15 DIAGNOSIS — Z01812 Encounter for preprocedural laboratory examination: Secondary | ICD-10-CM | POA: Insufficient documentation

## 2016-02-15 DIAGNOSIS — Z87442 Personal history of urinary calculi: Secondary | ICD-10-CM | POA: Insufficient documentation

## 2016-02-15 HISTORY — DX: Personal history of urinary calculi: Z87.442

## 2016-02-15 HISTORY — DX: Unspecified hearing loss, unspecified ear: H91.90

## 2016-02-15 HISTORY — DX: Other specified disorders of nose and nasal sinuses: J34.89

## 2016-02-15 LAB — POTASSIUM: Potassium: 4.3 mmol/L (ref 3.5–5.1)

## 2016-02-15 NOTE — Patient Instructions (Signed)
  Your procedure is scheduled on: December 19. 2017 (Tuesday) Report to Same Day Surgery 2nd floor medical mall [facility] Entrance-take elevator on left to 2nd floor.  Check in with surgery information desk.) To find out your arrival time please call [phone] between 1PM - 3PM on February 25, 2016 (Monday)  Remember: Instructions that are not followed completely may result in serious medical risk, up to and including death, or upon the discretion of your surgeon and anesthesiologist your surgery may need to be rescheduled.    _x___ 1. Do not eat food or drink liquids after midnight. No gum chewing or hard candies.     __x__ 2. No Alcohol for 24 hours before or after surgery.   __x__3. No Smoking for 24 prior to surgery.   ____  4. Bring all medications with you on the day of surgery if instructed.    __x__ 5. Notify your doctor if there is any change in your medical condition     (cold, fever, infections).     Do not wear jewelry, make-up, hairpins, clips or nail polish.  Do not wear lotions, powders, or perfumes. You may wear deodorant.  Do not shave 48 hours prior to surgery. Men may shave face and neck.  Do not bring valuables to the hospital.    [facility] is not responsible for any belongings or valuables.               Contacts, dentures or bridgework may not be worn into surgery.  Leave your suitcase in the car. After surgery it may be brought to your room.  For patients admitted to the hospital, discharge time is determined by your treatment team.   Patients discharged the day of surgery will not be allowed to drive home.  You will need someone to drive you home and stay with you the night of your procedure.    Please read over the following fact sheets that you were given:   [facility] Preparing for Surgery and or MRSA Information   _x___ Take these medicines the morning of surgery with A SIP OF WATER:    1. Fenofibrate  2.  3.  4.  5.  6.  ____Fleets  enema or Magnesium Citrate as directed.   __ Use CHG Soap or sage wipes as directed on instruction sheet   ____ Use inhalers on the day of surgery and bring to hospital day of surgery  ____ Stop metformin 2 days prior to surgery    ____ Take 1/2 of usual insulin dose the night before surgery and none on the morning of           surgery.   __x__ Stop Aspirin, Coumadin, Pllavix ,Eliquis, Effient, or Pradaxa (Patient stated he stopped Aspirin one month ago)  x__ Stop Anti-inflammatories such as Advil, Aleve, Ibuprofen, Motrin, Naproxen,          Naprosyn, Goodies powders or aspirin products. Ok to take Tylenol.   __x__ Stop supplements until after surgery.  (Stop Fish Oil until after surgery)  ____ Bring C-Pap to the hospital.

## 2016-02-18 ENCOUNTER — Other Ambulatory Visit: Payer: Self-pay | Admitting: Urology

## 2016-02-26 ENCOUNTER — Ambulatory Visit: Payer: PPO | Admitting: Anesthesiology

## 2016-02-26 ENCOUNTER — Ambulatory Visit
Admission: RE | Admit: 2016-02-26 | Discharge: 2016-02-26 | Disposition: A | Discharge status: Home / Self Care | Payer: PPO | Source: Ambulatory Visit | Attending: Urology | Admitting: Urology

## 2016-02-26 ENCOUNTER — Encounter
Admission: RE | Disposition: A | Discharge status: Home / Self Care | Payer: Self-pay | Source: Ambulatory Visit | Attending: Urology

## 2016-02-26 DIAGNOSIS — D494 Neoplasm of unspecified behavior of bladder: Secondary | ICD-10-CM | POA: Diagnosis not present

## 2016-02-26 DIAGNOSIS — Z8554 Personal history of malignant neoplasm of ureter: Secondary | ICD-10-CM | POA: Insufficient documentation

## 2016-02-26 DIAGNOSIS — Z885 Allergy status to narcotic agent status: Secondary | ICD-10-CM | POA: Insufficient documentation

## 2016-02-26 DIAGNOSIS — E785 Hyperlipidemia, unspecified: Secondary | ICD-10-CM | POA: Insufficient documentation

## 2016-02-26 DIAGNOSIS — Z79899 Other long term (current) drug therapy: Secondary | ICD-10-CM | POA: Diagnosis not present

## 2016-02-26 DIAGNOSIS — Z7982 Long term (current) use of aspirin: Secondary | ICD-10-CM | POA: Insufficient documentation

## 2016-02-26 DIAGNOSIS — Z888 Allergy status to other drugs, medicaments and biological substances status: Secondary | ICD-10-CM | POA: Diagnosis not present

## 2016-02-26 DIAGNOSIS — Z8551 Personal history of malignant neoplasm of bladder: Secondary | ICD-10-CM | POA: Diagnosis not present

## 2016-02-26 DIAGNOSIS — N401 Enlarged prostate with lower urinary tract symptoms: Secondary | ICD-10-CM | POA: Diagnosis not present

## 2016-02-26 DIAGNOSIS — Z9889 Other specified postprocedural states: Secondary | ICD-10-CM | POA: Diagnosis not present

## 2016-02-26 DIAGNOSIS — E7800 Pure hypercholesterolemia, unspecified: Secondary | ICD-10-CM | POA: Diagnosis not present

## 2016-02-26 DIAGNOSIS — R3914 Feeling of incomplete bladder emptying: Secondary | ICD-10-CM | POA: Diagnosis not present

## 2016-02-26 DIAGNOSIS — Z7951 Long term (current) use of inhaled steroids: Secondary | ICD-10-CM | POA: Diagnosis not present

## 2016-02-26 DIAGNOSIS — H409 Unspecified glaucoma: Secondary | ICD-10-CM | POA: Insufficient documentation

## 2016-02-26 DIAGNOSIS — C675 Malignant neoplasm of bladder neck: Secondary | ICD-10-CM | POA: Insufficient documentation

## 2016-02-26 DIAGNOSIS — G2581 Restless legs syndrome: Secondary | ICD-10-CM | POA: Insufficient documentation

## 2016-02-26 DIAGNOSIS — Z882 Allergy status to sulfonamides status: Secondary | ICD-10-CM | POA: Insufficient documentation

## 2016-02-26 DIAGNOSIS — Z8249 Family history of ischemic heart disease and other diseases of the circulatory system: Secondary | ICD-10-CM | POA: Diagnosis not present

## 2016-02-26 DIAGNOSIS — R351 Nocturia: Secondary | ICD-10-CM | POA: Diagnosis not present

## 2016-02-26 DIAGNOSIS — C679 Malignant neoplasm of bladder, unspecified: Secondary | ICD-10-CM

## 2016-02-26 DIAGNOSIS — I129 Hypertensive chronic kidney disease with stage 1 through stage 4 chronic kidney disease, or unspecified chronic kidney disease: Secondary | ICD-10-CM | POA: Diagnosis not present

## 2016-02-26 DIAGNOSIS — I10 Essential (primary) hypertension: Secondary | ICD-10-CM | POA: Diagnosis not present

## 2016-02-26 DIAGNOSIS — Z87442 Personal history of urinary calculi: Secondary | ICD-10-CM | POA: Insufficient documentation

## 2016-02-26 HISTORY — PX: URETEROSCOPY: SHX842

## 2016-02-26 HISTORY — PX: CYSTOSCOPY W/ RETROGRADES: SHX1426

## 2016-02-26 HISTORY — PX: TRANSURETHRAL RESECTION OF BLADDER TUMOR: SHX2575

## 2016-02-26 SURGERY — TURBT (TRANSURETHRAL RESECTION OF BLADDER TUMOR)
Anesthesia: General | Site: Ureter | Laterality: Right | Wound class: Clean Contaminated

## 2016-02-26 MED ORDER — LIDOCAINE HCL (CARDIAC) 20 MG/ML IV SOLN
INTRAVENOUS | Status: DC | PRN
  Administered 2016-02-26: 80 mg via INTRAVENOUS

## 2016-02-26 MED ORDER — PROPOFOL 10 MG/ML IV BOLUS
INTRAVENOUS | Status: DC | PRN
  Administered 2016-02-26 (×2): 50 mg via INTRAVENOUS
  Administered 2016-02-26: 20 mg via INTRAVENOUS
  Administered 2016-02-26: 80 mg via INTRAVENOUS

## 2016-02-26 MED ORDER — FENTANYL CITRATE (PF) 100 MCG/2ML IJ SOLN
INTRAMUSCULAR | Status: DC | PRN
  Administered 2016-02-26: 100 ug via INTRAVENOUS

## 2016-02-26 MED ORDER — FENTANYL CITRATE (PF) 100 MCG/2ML IJ SOLN: 25.0000 ug | INTRAMUSCULAR | Status: DC | PRN

## 2016-02-26 MED ORDER — ONDANSETRON HCL 4 MG/2ML IJ SOLN: 4.0000 mg | Freq: Once | INTRAMUSCULAR | Status: DC | PRN

## 2016-02-26 MED ORDER — LACTATED RINGERS IV SOLN
INTRAVENOUS | Status: DC
  Administered 2016-02-26: 08:00:00 via INTRAVENOUS
  Administered 2016-02-26: 1000 mL via INTRAVENOUS

## 2016-02-26 MED ORDER — IOTHALAMATE MEGLUMINE 43 % IV SOLN
INTRAVENOUS | Status: DC | PRN
Start: 2016-02-26 — End: 2016-02-26
  Administered 2016-02-26: 15 mL

## 2016-02-26 MED ORDER — FAMOTIDINE 20 MG PO TABS
ORAL_TABLET | ORAL | Status: AC
  Filled 2016-02-26: qty 1

## 2016-02-26 MED ORDER — CEFAZOLIN IN D5W 1 GM/50ML IV SOLN
INTRAVENOUS | Status: AC
  Filled 2016-02-26: qty 50

## 2016-02-26 MED ORDER — ONDANSETRON HCL 4 MG/2ML IJ SOLN
INTRAMUSCULAR | Status: DC | PRN
Start: 2016-02-26 — End: 2016-02-26
  Administered 2016-02-26: 4 mg via INTRAVENOUS

## 2016-02-26 MED ORDER — CEFAZOLIN IN D5W 1 GM/50ML IV SOLN
1.0000 g | INTRAVENOUS | Status: AC
  Administered 2016-02-26: 1 g via INTRAVENOUS

## 2016-02-26 MED ORDER — HYDROCODONE-ACETAMINOPHEN 5-325 MG PO TABS: 1.0000 | ORAL_TABLET | Freq: Four times a day (QID) | ORAL | 0 refills | Status: DC | PRN

## 2016-02-26 MED ORDER — EPHEDRINE SULFATE 50 MG/ML IJ SOLN
INTRAMUSCULAR | Status: DC | PRN
  Administered 2016-02-26: 10 mg via INTRAVENOUS
  Administered 2016-02-26: 5 mg via INTRAVENOUS

## 2016-02-26 MED ORDER — FAMOTIDINE 20 MG PO TABS
20.0000 mg | ORAL_TABLET | Freq: Once | ORAL | Status: AC
  Administered 2016-02-26: 20 mg via ORAL

## 2016-02-26 SURGICAL SUPPLY — 39 items
BAG DRAIN CYSTO-URO LG1000N (MISCELLANEOUS) ×5 IMPLANT
BAG URO DRAIN 2000ML W/SPOUT (MISCELLANEOUS) ×5 IMPLANT
BRUSH SCRUB EZ 1% IODOPHOR (MISCELLANEOUS) IMPLANT
CATH FOLEY 2WAY  5CC 16FR (CATHETERS)
CATH URETL 5X70 OPEN END (CATHETERS) ×5 IMPLANT
CATH URTH 16FR FL 2W BLN LF (CATHETERS) IMPLANT
CNTNR SPEC 2.5X3XGRAD LEK (MISCELLANEOUS) ×3
CONRAY 43 FOR UROLOGY 50M (MISCELLANEOUS) ×5 IMPLANT
CONT SPEC 4OZ STER OR WHT (MISCELLANEOUS) ×2
CONTAINER SPEC 2.5X3XGRAD LEK (MISCELLANEOUS) ×3 IMPLANT
DRAPE UTILITY 15X26 TOWEL STRL (DRAPES) ×5 IMPLANT
DRESSING TELFA 4X3 1S ST N-ADH (GAUZE/BANDAGES/DRESSINGS) ×5 IMPLANT
ELECT LOOP 22F BIPOLAR SML (ELECTROSURGICAL) ×5
ELECT REM PT RETURN 9FT ADLT (ELECTROSURGICAL)
ELECTRODE LOOP 22F BIPOLAR SML (ELECTROSURGICAL) ×3 IMPLANT
ELECTRODE REM PT RTRN 9FT ADLT (ELECTROSURGICAL) IMPLANT
GLOVE BIO SURGEON STRL SZ 6.5 (GLOVE) ×4 IMPLANT
GLOVE BIO SURGEONS STRL SZ 6.5 (GLOVE) ×1
GOWN STRL REUS W/ TWL LRG LVL3 (GOWN DISPOSABLE) ×6 IMPLANT
GOWN STRL REUS W/TWL LRG LVL3 (GOWN DISPOSABLE) ×4
INFUSOR MANOMETER BAG 3000ML (MISCELLANEOUS) ×5 IMPLANT
INTRODUCER DILATOR DOUBLE (INTRODUCER) IMPLANT
KIT RM TURNOVER CYSTO AR (KITS) ×5 IMPLANT
LOOP CUT BIPOLAR 24F LRG (ELECTROSURGICAL) IMPLANT
NDL SAFETY ECLIPSE 18X1.5 (NEEDLE) ×3 IMPLANT
NEEDLE HYPO 18GX1.5 SHARP (NEEDLE) ×2
PACK CYSTO AR (MISCELLANEOUS) ×5 IMPLANT
SCRUB POVIDONE IODINE 4 OZ (MISCELLANEOUS) ×5 IMPLANT
SENSORWIRE 0.038 NOT ANGLED (WIRE) ×10
SET CYSTO W/LG BORE CLAMP LF (SET/KITS/TRAYS/PACK) IMPLANT
SET IRRIG Y TYPE TUR BLADDER L (SET/KITS/TRAYS/PACK) ×5 IMPLANT
SET IRRIGATING DISP (SET/KITS/TRAYS/PACK) ×5 IMPLANT
SHEATH URETERAL 12FRX35CM (MISCELLANEOUS) IMPLANT
SOL .9 NS 3000ML IRR  AL (IV SOLUTION) ×2
SOL .9 NS 3000ML IRR UROMATIC (IV SOLUTION) ×3 IMPLANT
SURGILUBE 2OZ TUBE FLIPTOP (MISCELLANEOUS) ×5 IMPLANT
SYRINGE IRR TOOMEY STRL 70CC (SYRINGE) ×5 IMPLANT
WATER STERILE IRR 1000ML POUR (IV SOLUTION) ×5 IMPLANT
WIRE SENSOR 0.038 NOT ANGLED (WIRE) ×6 IMPLANT

## 2016-02-26 NOTE — Op Note (Signed)
Date of procedure: 02/26/16  Preoperative diagnosis:  1. History of bladder cancer 2. History of right ureteral cancer 3. Bladder neck lesion   Postoperative diagnosis:  1. same   Procedure: 1. TURBT, small 2. Bilateral retrograde pyelogram 3. Right ureteroscopy  Surgeon: [name], MD  Anesthesia: General  Complications: None  Intraoperative findings: Normal bilateral retrograde pyelogram. Right distal ureteroscopy negative for any intraluminal pathology. Nodular 0.5 cm area on right bladder neck adjacent to trigone. Slight erythema around right UO.  EBL: Minimal  Specimens: Bladder tumor  Drains: None  Indication: Anthony Gonzales is a 82 y.o. patient with history of TCC involving bladder and right distal ureter. He underwent BCG induction course which was well-tolerated with a stent in place. Surveillance cystoscopy revealed a nodular region on the right bladder neck.  After reviewing the management options for treatment, he elected to proceed with the above surgical procedure(s). We have discussed the potential benefits and risks of the procedure, side effects of the proposed treatment, the likelihood of the patient achieving the goals of the procedure, and any potential problems that might occur during the procedure or recuperation. Informed consent has been obtained.  Description of procedure:  The patient was taken to the operating room and general anesthesia was induced.  The patient was placed in the dorsal lithotomy position, prepped and draped in the usual sterile fashion, and preoperative antibiotics were administered. A preoperative time-out was performed.   A 21 French cystoscope was advanced per urethra into the bladder. Upon inspecting the bladder, he was noted to have elevated bladder neck with evidence of chronic outlet obstruction, trabeculated bladder with a wid[place]outh diverticulum on the left posterior wall. The only lesion identified was located on the  right bladder neck adjacent to the right trigone. It was nodular in appearance and sessile. It did not have a particularly frondular appearance and was more granulomatous in appearance. There was some mild erythema just posterior to the right UO, otherwise no other additional findings.  Bilateral retrograde pyelograms were then performed by inserting a 5 [place] open-ended ureteral catheter just within the UO. Each of the collecting systems were decompressed without filling defects.  The right distal ureter was somewhat dilated but no suspicious lesions. A sensor wire was then placed up to level of the kidney and the right side. A 4.5 French semirigid right ureteroscope was then advanced through the distal ureter without difficulty up to the level of the iliacs. There was no intraluminal findings, tumors, or erythema which was suspicious. Ureteroscopy was completely negative. The wire was then removed. No stent was left in place given minimal ureteral trauma.  A 26 French resectoscope was then brought in after using male sounds to gently dilate the fossa navicularis. The loop was used to resect the nodular area on the bladder neck in 3 small swipes. A cup biopsy forceps were also used to biopsy the area of erythema just posterior to the right UO which were all sent together as one specimen. These areas were then carefully fulgurated using the loop until adequate hemostasis was achieved. The bladder was then drained and the scope was removed. He was cleaned and dried, repositioned the supine position, reversed from anesthesia, taken to the PACU in stable condition.  Plan: I will call him with his pathology results. I suspect this will likely be negative and granulomatous. If that's the case, we will plan for maintenance BCG followed by cystoscopy in 3 months.  [name], M.D.

## 2016-02-26 NOTE — Transfer of Care (Signed)
Immediate Anesthesia Transfer of Care Note  Patient: Anthony Gonzales  Procedure(s) Performed: Procedure(s): TRANSURETHRAL RESECTION OF BLADDER TUMOR (TURBT) (N/A) CYSTOSCOPY WITH RETROGRADE PYELOGRAM (Bilateral) URETEROSCOPY (Right)  Patient Location: PACU  Anesthesia Type:General  Level of Consciousness: awake  Airway & Oxygen Therapy: Patient Spontanous Breathing  Post-op Assessment: Report given to RN  Post vital signs: stable  Last Vitals:  Vitals:   02/26/16 0609  BP: 134/79  Pulse: 73  Resp: 12  Temp: 37.1 C    Last Pain:  Vitals:   02/26/16 0609  TempSrc: Oral         Complications: No apparent anesthesia complications

## 2016-02-26 NOTE — Interval H&P Note (Signed)
History and Physical Interval Note:  02/26/2016 7:23 AM  Anthony Gonzales  has presented today for surgery, with the diagnosis of malignant neoplasm of urinary bladder  The various methods of treatment have been discussed with the patient and family. After consideration of risks, benefits and other options for treatment, the patient has consented to  Procedure(s): TRANSURETHRAL RESECTION OF BLADDER TUMOR (TURBT) (N/A) CYSTOSCOPY WITH RETROGRADE PYELOGRAM (Bilateral) URETEROSCOPY (Right) as a surgical intervention .  The patient's history has been reviewed, patient examined, no change in status, stable for surgery.  I have reviewed the patient's chart and labs.  Questions were answered to the patient's satisfaction.    RRR CTAB  [name]

## 2016-02-26 NOTE — Discharge Instructions (Signed)
AMBULATORY SURGERY  DISCHARGE INSTRUCTIONS   1) The drugs that you were given will stay in your system until tomorrow so for the next 24 hours you should not:  A) Drive an automobile B) Make any legal decisions C) Drink any alcoholic beverage   2) You may resume regular meals tomorrow.  Today it is better to start with liquids and gradually work up to solid foods.  You may eat anything you prefer, but it is better to start with liquids, then soup and crackers, and gradually work up to solid foods.   3) Please notify your doctor immediately if you have any unusual bleeding, trouble breathing, redness and pain at the surgery site, drainage, fever, or pain not relieved by medication. 4)   5) Your post-operative visit with Dr.                                     is: Date:                        Time:    Please call to schedule your post-operative visit.  6) Additional Instructions: Transurethral Resection of Bladder Tumor (TURBT) or Bladder Biopsy   Definition:  Transurethral Resection of the Bladder Tumor is a surgical procedure used to diagnose and remove tumors within the bladder. TURBT is the most common treatment for early stage bladder cancer.  General instructions:     Your recent bladder surgery requires very little post hospital care but some definite precautions.  Despite the fact that no skin incisions were used, the area around the bladder incisions are raw and covered with scabs to promote healing and prevent bleeding. Certain precautions are needed to insure that the scabs are not disturbed over the next 2-4 weeks while the healing proceeds.  Because the raw surface inside your bladder and the irritating effects of urine you may expect frequency of urination and/or urgency (a stronger desire to urinate) and perhaps even getting up at night more often. This will usually resolve or improve slowly over the healing period. You may see some blood in your urine over the  first 6 weeks. Do not be alarmed, even if the urine was clear for a while. Get off your feet and drink lots of fluids until clearing occurs. If you start to pass clots or don't improve call us.  Diet:  You may return to your normal diet immediately. Because of the raw surface of your bladder, alcohol, spicy foods, foods high in acid and drinks with caffeine may cause irritation or frequency and should be used in moderation. To keep your urine flowing freely and avoid constipation, drink plenty of fluids during the day (8-10 glasses). Tip: Avoid cranberry juice because it is very acidic.  Activity:  Your physical activity doesn't need to be restricted. However, if you are very active, you may see some blood in the urine. We suggest that you reduce your activity under the circumstances until the bleeding has stopped.  Bowels:  It is important to keep your bowels regular during the postoperative period. Straining with bowel movements can cause bleeding. A bowel movement every other day is reasonable. Use a mild laxative if needed, such as milk of magnesia 2-3 tablespoons, or 2 Dulcolax tablets. Call if you continue to have problems. If you had been taking narcotics for pain, before, during or after your surgery, you may  be constipated. Take a laxative if necessary.    Medication:  You should resume your pre-surgery medications unless told not to. In addition you may be given an antibiotic to prevent or treat infection. Antibiotics are not always necessary. All medication should be taken as prescribed until the bottles are finished unless you are having an unusual reaction to one of the drugs.   [place] [address] [place], [place] [address] [phone]

## 2016-02-26 NOTE — H&P (View-Only) (Signed)
2:46 PM  02/06/16  Anthony Gonzales [birth year] [id number]  Referring provider: Idelle Crouch, MD [name], [place] [address]  Chief Complaint  Patient presents with  . Cysto    HPI:  82 year old male who initially presented with asymptomatic incidental moderate right hydroureteronephrosis in 12/2014.  He is taken to the OR on 01/30/2015 at which time right ureteroscopy revealed as suspicious area within the right distal ureter with a shaggy, irregular appearance but no obvious papillary or pedunculated tumor. Brush cytology was negative for any malignancy.   Ureteral biopsy revealed some atypia favoring neoplasm but essentially was non-diagnostic.   Interval follow-up renal ultrasound on 05/02/2015 did show complete resolution of his hydronephrosis.  He was counseled to return for a second look but deferred this for ~6 months.  He eventually return to the OR on 08/20/15 at which time the right distal ureteral was highly suspicious for papillary urothelial carcinoma was was biopsied. 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   He returned to operating room on 09/12/2015 for a second look ureteroscopy/biopsy and TURBT. Given the depth of resection, mitomycin was not administered.  Pathology showed no residual tumor in the bladder or the distal ureter. A Bard Optima stent was replaced at the time of the procedure.  He completed an induction course of BCG completed on 11/30/2015 6 doses. This was performed with the stent in place.  The stent was subsequently removed.  Never smoker.     He does have baseline chronic renal insufficiency.  Cr 1.6-2.   He also has a history of recurrent nephrolithiasis although no stone seen on CT scan as of 12/2014.  He is history of BPH and postop urinary retention,  occasionally CIC's as needed.  He returns to the office today for surveillance cystoscopy.  Most recent renal ultrasound on 01/30/2016 shows no evidence of hydronephrosis bilaterally.   PMH: Past Medical History:  Diagnosis Date  . Arthritis   . Benign fibroma of prostate 08/23/2013  . BPH (benign prostatic hyperplasia)   . Calculus of kidney 08/23/2013  . Cancer ([place])    skin  . Glaucoma    no drops in 3 mo pressure good, pt denies glaucoma, eye pressure has been measuring alright.  [name] HLD (hyperlipidemia)   . HTN (hypertension) 12/26/2014  . Hypertension   . Hyponatremia 12/26/2014  . Migraines    history of migraines when he was younger.  [name] Restless leg syndrome   . UTI (lower urinary tract infection) 12/26/2014  . Vertigo     Surgical History: Past Surgical History:  Procedure Laterality Date  . COLONOSCOPY    . CYSTOSCOPY W/ RETROGRADES Right 01/30/2015   Procedure: CYSTOSCOPY WITH RETROGRADE PYELOGRAM;  Surgeon: [name], MD;  Location: [facility];  Service: Urology;  Laterality: Right;  . CYSTOSCOPY W/ URETERAL STENT PLACEMENT Right 08/20/2015   Procedure: CYSTOSCOPY WITH RETROGRADE PYELOGRAM/POSSIBLE URETERAL STENT PLACEMENT/BLADDER BIOPSY;  Surgeon: [name], MD;  Location: [facility];  Service: Urology;  Laterality: Right;  . CYSTOSCOPY W/ URETERAL STENT PLACEMENT Right 09/12/2015   Procedure: CYSTOSCOPY WITH STENT REPLACEMENT;  Surgeon: [name], MD;  Location: [facility];  Service: Urology;  Laterality: Right;  . CYSTOSCOPY WITH BIOPSY Right 09/12/2015   Procedure: CYSTOSCOPY WITH BLADDER AND URETERAL BIOPSY;  Surgeon: [name], MD;  Location:  [facility];  Service: Urology;  Laterality: Right;  . CYSTOSCOPY WITH STENT PLACEMENT Right 01/30/2015   Procedure: CYSTOSCOPY WITH STENT PLACEMENT;  Surgeon: [name], MD;  Location: [facility];  Service: Urology;  Laterality: Right;  . EYE SURGERY Bilateral    Cataract Extraction with IOL  . HOLMIUM LASER  APPLICATION N/A 1/75/1025   Procedure:  HOLMIUM LASER APPLICATION;  Surgeon: [name], MD;  Location: [facility];  Service: Urology;  Laterality: N/A;  . SPERMATOCELECTOMY    . TONSILLECTOMY    . TRANSURETHRAL RESECTION OF BLADDER TUMOR WITH MITOMYCIN-C N/A 09/12/2015   Procedure: TRANSURETHRAL RESECTION OF BLADDER TUMOR ;  Surgeon: [name], MD;  Location: [facility];  Service: Urology;  Laterality: N/A;  . URETEROSCOPY Right 01/30/2015   Procedure: URETEROSCOPY/ WITH BIOPSY AND CYTOLOGY BRUSHING;  Surgeon: [name], MD;  Location: [facility];  Service: Urology;  Laterality: Right;  . URETEROSCOPY Right 08/20/2015   Procedure: URETEROSCOPY;  Surgeon: [name], MD;  Location: [facility];  Service: Urology;  Laterality: Right;  . URETEROSCOPY Right 09/12/2015   Procedure: URETEROSCOPY;  Surgeon: [name], MD;  Location: [facility];  Service: Urology;  Laterality: Right;    Home Medications:    Medication List       Accurate as of 02/06/16 11:59 PM. Always use your most recent med list.          aspirin EC 81 MG tablet Take 81 mg by mouth. At supper.   benazepril-hydrochlorthiazide 20-12.5 MG tablet Commonly known as:  LOTENSIN HCT Take 1 tablet by mouth daily. In am.   ciprofloxacin 500 MG tablet Commonly known as:  CIPRO Take 1 tablet (500 mg total) by mouth every 12 (twelve) hours.   docusate sodium 100 MG capsule Commonly known as:  COLACE Take 1 capsule (100 mg total) by mouth 2 (two) times daily.   DYMISTA 137-50 MCG/ACT Susp Generic drug:  Azelastine-Fluticasone PLACE 1 SPRAY INTO BOTH NOSTRILS 2 (TWO) TIMES DAILY.   fenofibrate micronized 134 MG capsule Commonly known as:  LOFIBRA Take 134 mg by mouth daily before breakfast.   Fish Oil 1200 MG Caps Take 1 mg by mouth 2 (two) times daily.   multivitamin capsule Take 1 capsule by mouth every morning.   tamsulosin 0.4 MG Caps capsule Commonly known as:  FLOMAX TAKE ONE CAPSULE BY MOUTH DAILY     TGT PSYLLIUM FIBER 0.52 g capsule Generic drug:  psyllium Take 0.52 g by mouth as needed.       Allergies:  Allergies  Allergen Reactions  . Demerol [Meperidine] Nausea And Vomiting    Other reaction(s): Unknown Other reaction(s): Unknown  . Lipitor [Atorvastatin] Swelling  . Septra [Sulfamethoxazole-Trimethoprim] Nausea And Vomiting    Per patient "this medication made me feel worse since starting this antibiotic".    . Sulfa Antibiotics Rash    Family History: Family History  Problem Relation Age of Onset  . Hypertension Mother   . Hypertension Father   . Prostate cancer Brother   . Kidney disease Neg Hx     Social History:  reports that he has never smoked. He has never used smokeless tobacco. He reports that he does not drink alcohol or use drugs.   Physical Exam: BP 131/82   Pulse 70   Ht 6' (1.829 m)   Wt 193 lb 9.6 oz (87.8 kg)   BMI 26.26 kg/m   Constitutional:  Alert and oriented, No acute distress.   HEENT: Chicken AT, moist mucus membranes.  Trachea midline, no masses. Cardiovascular: No clubbing, cyanosis, or edema.   Respiratory: Normal respiratory effort, no increased work of breathing.  GU: Normal phallus with was the topic meatus, widely patent Skin: No rashes, bruises or suspicious lesions. Neurologic: Grossly intact, no focal deficits, moving all 4 extremities. Psychiatric: Normal mood and affect.  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 No hydronephrosis is noted. There is a  cyst in the lower pole of the right kidney measuring 3.4 cm.  Left Kidney:  Length: 11.5 cm.  No hydronephrosis is seen.  Bladder:  The urinary bladder is not well distended but no abnormality is seen. The prostate is prominent measuring 5.4 x 4.2 x 4.3 cm.  IMPRESSION: 1. No hydronephrosis. 2. Prominent prostate.   Electronically Signed   By: Ivar Drape M.D.   On: 01/30/2016 13:34  RUS reviewed personally today  Cystoscopy Procedure Note  Patient identification was confirmed, informed consent was obtained, and patient was prepped using Betadine solution.  Lidocaine jelly was administered per urethral meatus.    Preoperative abx where received prior to procedure.     Pre-Procedure: - Inspection reveals a normal caliber ureteral meatus.  Procedure: The flexible cystoscope was introduced without difficulty - No urethral strictures/lesions are present. - Normal prostate  - Bladder neck lesion appreciated at ~9 oc lock position - Bilateral ureteral orifices identified - Bladder mucosa  reveals no ulcers, tumors, or lesions - No bladder stones - moderate trabeculation  Retroflexion shows approximately 1 cm nodular mass at the bladder neck around the 9:00 position suspicious for tumor recurrence.   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 Risks and benefits reviewed again in detail. He's previously undergone the procedure and is familiar.  2. Right distal ureteral cancer Previously no interested in distal ureterectomy/ nephrou S/p multiple biopsies/ laser ablation with stent/ BCG course In presence of bladder neck lesion, we will also reinspect right distal ureter in the OR.  Plan for URS/ RTG/ possible biospy.  3. History of nephrolithiasis Currently stone free.     4. Nocturia/ history of retention/ BPH with sensation of incomplete bladder emtying Continue Flomax CIC as needed PVR minimal previously    [name], MD  [facility] [address] [place], [place] [address] [phone]

## 2016-02-26 NOTE — Anesthesia Preprocedure Evaluation (Signed)
Anesthesia Evaluation  Patient identified by MRN, date of birth, ID band Patient awake    Reviewed: Allergy & Precautions, NPO status , Patient's Chart, lab work & pertinent test results  History of Anesthesia Complications Negative for: history of anesthetic complications  Airway Mallampati: III       Dental  (+) Teeth Intact   Pulmonary neg pulmonary ROS,    breath sounds clear to auscultation       Cardiovascular Exercise Tolerance: Poor hypertension, Pt. on medications  Rhythm:Regular     Neuro/Psych  Headaches,    GI/Hepatic negative GI ROS, Neg liver ROS,   Endo/Other  negative endocrine ROS  Renal/GU CRFRenal disease     Musculoskeletal   Abdominal Normal abdominal exam  (+)   Peds  Hematology negative hematology ROS (+)   Anesthesia Other Findings Past Medical History: No date: Arthritis 08/23/2013: Benign fibroma of prostate No date: BPH (benign prostatic hyperplasia) 08/23/2013: Calculus of kidney No date: Cancer ([place])     Comment: skin No date: Glaucoma     Comment: no drops in 3 mo pressure good, pt denies               glaucoma, eye pressure has been measuring               alright. No date: History of kidney stones No date: HLD (hyperlipidemia) No date: HOH (hard of hearing)     Comment: Left Hearing Aid 12/26/2014: HTN (hypertension) No date: Hypertension 12/26/2014: Hyponatremia No date: Migraines     Comment: history of migraines when he was younger. No date: Restless leg syndrome No date: Sinus drainage 12/26/2014: UTI (lower urinary tract infection) No date: Vertigo   Reproductive/Obstetrics negative OB ROS                             Anesthesia Physical  Anesthesia Plan  ASA: III  Anesthesia Plan: General   Post-op Pain Management:    Induction: Intravenous  Airway Management Planned: LMA  Additional Equipment:   Intra-op Plan:    Post-operative Plan: Extubation in OR  Informed Consent: I have reviewed the patients History and Physical, chart, labs and discussed the procedure including the risks, benefits and alternatives for the proposed anesthesia with the patient or authorized representative who has indicated his/her understanding and acceptance.     Plan Discussed with: CRNA  Anesthesia Plan Comments:         Anesthesia Quick Evaluation

## 2016-02-26 NOTE — Anesthesia Postprocedure Evaluation (Signed)
Anesthesia Post Note  Patient: Anthony Gonzales  Procedure(s) Performed: Procedure(s) (LRB): TRANSURETHRAL RESECTION OF BLADDER TUMOR (TURBT) (N/A) CYSTOSCOPY WITH RETROGRADE PYELOGRAM (Bilateral) URETEROSCOPY (Right)  Patient location during evaluation: PACU Anesthesia Type: General Level of consciousness: awake and alert Pain management: pain level controlled Vital Signs Assessment: post-procedure vital signs reviewed and stable Respiratory status: spontaneous breathing, nonlabored ventilation, respiratory function stable and patient connected to nasal cannula oxygen Cardiovascular status: blood pressure returned to baseline and stable Postop Assessment: no signs of nausea or vomiting Anesthetic complications: no     Last Vitals:  Vitals:   02/26/16 0948 02/26/16 0951  BP: (!) 157/64 138/72  Pulse: 74 72  Resp: 16   Temp: 36.6 C     Last Pain:  Vitals:   02/26/16 0920  TempSrc:   PainSc: 0-No pain                 Martha Clan

## 2016-02-26 NOTE — Anesthesia Procedure Notes (Signed)
Procedure Name: LMA Insertion Date/Time: 02/26/2016 7:59 AM Performed by: Leander Rams Pre-anesthesia Checklist: Patient identified, Emergency Drugs available, Suction available, Patient being monitored and Timeout performed Patient Re-evaluated:Patient Re-evaluated prior to inductionOxygen Delivery Method: Circle system utilized Preoxygenation: Pre-oxygenation with 100% oxygen Intubation Type: IV induction LMA: LMA inserted LMA Size: 4.5

## 2016-02-28 LAB — SURGICAL PATHOLOGY

## 2016-03-12 ENCOUNTER — Telehealth: Payer: Self-pay | Admitting: Urology

## 2016-03-12 NOTE — Telephone Encounter (Addendum)
Called patient/ his wife to discuss pathology report.    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  No muscle in the specimen.   Recommend return to OR for re-TUR with mitomycin per AUA/ NCCN guidelines due to risk up upstaging.  Risks and benefits reviewed in detail.    Concerned about repeat anesthetics.  Discussed possibility of spinal which is would consider.    Will schedule surgery as above.  All questions answered.  [name], MD

## 2016-03-14 ENCOUNTER — Other Ambulatory Visit: Payer: Self-pay | Admitting: Radiology

## 2016-03-14 DIAGNOSIS — C679 Malignant neoplasm of bladder, unspecified: Secondary | ICD-10-CM

## 2016-03-14 NOTE — Telephone Encounter (Signed)
Notified pt of surgery scheduled with Dr Erlene Quan on 03/24/16, pre-admit testing appt on 03/17/16 '@1'$ :00 & to call Friday prior to surgery for arrival time to [facility]. Advised pt that BCG treatments will be postponed until after pathology results are available per Dr Erlene Quan. Pt voices understanding.

## 2016-03-17 ENCOUNTER — Encounter
Admission: RE | Admit: 2016-03-17 | Discharge: 2016-03-17 | Disposition: A | Discharge status: Home / Self Care | Payer: PPO | Source: Ambulatory Visit | Attending: Urology | Admitting: Urology

## 2016-03-17 DIAGNOSIS — C679 Malignant neoplasm of bladder, unspecified: Secondary | ICD-10-CM | POA: Insufficient documentation

## 2016-03-17 DIAGNOSIS — Z01812 Encounter for preprocedural laboratory examination: Secondary | ICD-10-CM | POA: Insufficient documentation

## 2016-03-17 LAB — DIFFERENTIAL
BASOS ABS: 0 10*3/uL (ref 0–0.1)
BASOS PCT: 1 %
Eosinophils Absolute: 0.1 10*3/uL (ref 0–0.7)
Eosinophils Relative: 2 %
Lymphocytes Relative: 27 %
Lymphs Abs: 1.8 10*3/uL (ref 1.0–3.6)
Monocytes Absolute: 0.7 10*3/uL (ref 0.2–1.0)
Monocytes Relative: 10 %
NEUTROS ABS: 3.9 10*3/uL (ref 1.4–6.5)
Neutrophils Relative %: 60 %

## 2016-03-17 LAB — URINALYSIS, COMPLETE (UACMP) WITH MICROSCOPIC
BACTERIA UA: NONE SEEN
BILIRUBIN URINE: NEGATIVE
Glucose, UA: NEGATIVE mg/dL
HGB URINE DIPSTICK: NEGATIVE
Ketones, ur: NEGATIVE mg/dL
LEUKOCYTES UA: NEGATIVE
Nitrite: NEGATIVE
PROTEIN: NEGATIVE mg/dL
Specific Gravity, Urine: 1.012 (ref 1.005–1.030)
Squamous Epithelial / LPF: NONE SEEN
pH: 5 (ref 5.0–8.0)

## 2016-03-17 LAB — BASIC METABOLIC PANEL
Anion gap: 6 (ref 5–15)
BUN: 24 mg/dL — ABNORMAL HIGH (ref 6–20)
CALCIUM: 9.2 mg/dL (ref 8.9–10.3)
CO2: 27 mmol/L (ref 22–32)
CREATININE: 1.34 mg/dL — AB (ref 0.61–1.24)
Chloride: 100 mmol/L — ABNORMAL LOW (ref 101–111)
GFR calc Af Amer: 55 mL/min — ABNORMAL LOW (ref >60)
GFR, EST NON AFRICAN AMERICAN: 48 mL/min — AB (ref >60)
Glucose, Bld: 83 mg/dL (ref 65–99)
Potassium: 4.1 mmol/L (ref 3.5–5.1)
SODIUM: 133 mmol/L — AB (ref 135–145)

## 2016-03-17 LAB — CBC
HCT: 41.2 % (ref 40.0–52.0)
Hemoglobin: 13.9 g/dL (ref 13.0–18.0)
MCH: 29.8 pg (ref 26.0–34.0)
MCHC: 33.8 g/dL (ref 32.0–36.0)
MCV: 88 fL (ref 80.0–100.0)
PLATELETS: 260 10*3/uL (ref 150–440)
RBC: 4.69 MIL/uL (ref 4.40–5.90)
RDW: 13.1 % (ref 11.5–14.5)
WBC: 6.5 10*3/uL (ref 3.8–10.6)

## 2016-03-17 NOTE — Patient Instructions (Signed)
  Your procedure is scheduled on: Mon. 03/24/16 Report to Day Surgery. To find out your arrival time please call [phone] between 1PM - 3PM on Friday 03/21/16.  Remember: Instructions that are not followed completely may result in serious medical risk, up to and including death, or upon the discretion of your surgeon and anesthesiologist your surgery may need to be rescheduled.    _x___ 1. Do not eat food or drink liquids after midnight. No gum chewing or hard candies.     __x__ 2. No Alcohol for 24 hours before or after surgery.   ____ 3. Do Not Smoke For 24 Hours Prior to Your Surgery.   ____ 4. Bring all medications with you on the day of surgery if instructed.    _x___ 5. Notify your doctor if there is any change in your medical condition     (cold, fever, infections).       Do not wear jewelry, make-up, hairpins, clips or nail polish.  Do not wear lotions, powders, or perfumes. You may wear deodorant.  Do not shave 48 hours prior to surgery. Men may shave face and neck.  Do not bring valuables to the hospital.    [facility] is not responsible for any belongings or valuables.               Contacts, dentures or bridgework may not be worn into surgery.  Leave your suitcase in the car. After surgery it may be brought to your room.  For patients admitted to the hospital, discharge time is determined by your                treatment team.   Patients discharged the day of surgery will not be allowed to drive home.   Please read over the following fact sheets that you were given:      __x__ Take these medicines the morning of surgery with A SIP OF WATER:    1. fenofibrate micronized (LOFIBRA) 134 MG capsule  2.   3.   4.  5.  6.  ____ Fleet Enema (as directed)   ____ Use CHG Soap as directed  ____ Use inhalers on the day of surgery  ____ Stop metformin 2 days prior to surgery    ____ Take 1/2 of usual insulin dose the night before surgery and none on the morning  of surgery.   __x__ Stop aspirin on Already stopped asa  __x__ Stop Anti-inflammatories on tylenol only   __x__ Stop supplements until after surgery.  Already stopped fish oil  ____ Bring C-Pap to the hospital.

## 2016-03-18 LAB — URINE CULTURE: CULTURE: NO GROWTH

## 2016-03-20 ENCOUNTER — Other Ambulatory Visit: Payer: Self-pay | Admitting: Urology

## 2016-03-24 ENCOUNTER — Encounter
Admission: RE | Disposition: A | Discharge status: Home / Self Care | Payer: Self-pay | Source: Ambulatory Visit | Attending: Urology

## 2016-03-24 ENCOUNTER — Ambulatory Visit: Payer: PPO | Admitting: Anesthesiology

## 2016-03-24 ENCOUNTER — Ambulatory Visit
Admission: RE | Admit: 2016-03-24 | Discharge: 2016-03-24 | Disposition: A | Discharge status: Home / Self Care | Payer: PPO | Source: Ambulatory Visit | Attending: Urology | Admitting: Urology

## 2016-03-24 ENCOUNTER — Encounter: Payer: Self-pay | Admitting: *Deleted

## 2016-03-24 DIAGNOSIS — Z9889 Other specified postprocedural states: Secondary | ICD-10-CM | POA: Diagnosis not present

## 2016-03-24 DIAGNOSIS — R351 Nocturia: Secondary | ICD-10-CM | POA: Diagnosis not present

## 2016-03-24 DIAGNOSIS — I10 Essential (primary) hypertension: Secondary | ICD-10-CM | POA: Diagnosis not present

## 2016-03-24 DIAGNOSIS — E785 Hyperlipidemia, unspecified: Secondary | ICD-10-CM | POA: Diagnosis not present

## 2016-03-24 DIAGNOSIS — N401 Enlarged prostate with lower urinary tract symptoms: Secondary | ICD-10-CM | POA: Insufficient documentation

## 2016-03-24 DIAGNOSIS — Z79899 Other long term (current) drug therapy: Secondary | ICD-10-CM | POA: Insufficient documentation

## 2016-03-24 DIAGNOSIS — I129 Hypertensive chronic kidney disease with stage 1 through stage 4 chronic kidney disease, or unspecified chronic kidney disease: Secondary | ICD-10-CM | POA: Insufficient documentation

## 2016-03-24 DIAGNOSIS — Z8042 Family history of malignant neoplasm of prostate: Secondary | ICD-10-CM | POA: Insufficient documentation

## 2016-03-24 DIAGNOSIS — Z882 Allergy status to sulfonamides status: Secondary | ICD-10-CM | POA: Diagnosis not present

## 2016-03-24 DIAGNOSIS — Z885 Allergy status to narcotic agent status: Secondary | ICD-10-CM | POA: Diagnosis not present

## 2016-03-24 DIAGNOSIS — G2581 Restless legs syndrome: Secondary | ICD-10-CM | POA: Insufficient documentation

## 2016-03-24 DIAGNOSIS — Z8249 Family history of ischemic heart disease and other diseases of the circulatory system: Secondary | ICD-10-CM | POA: Insufficient documentation

## 2016-03-24 DIAGNOSIS — Z888 Allergy status to other drugs, medicaments and biological substances status: Secondary | ICD-10-CM | POA: Diagnosis not present

## 2016-03-24 DIAGNOSIS — C675 Malignant neoplasm of bladder neck: Secondary | ICD-10-CM | POA: Insufficient documentation

## 2016-03-24 DIAGNOSIS — Z87442 Personal history of urinary calculi: Secondary | ICD-10-CM | POA: Diagnosis not present

## 2016-03-24 DIAGNOSIS — Z883 Allergy status to other anti-infective agents status: Secondary | ICD-10-CM | POA: Insufficient documentation

## 2016-03-24 DIAGNOSIS — C679 Malignant neoplasm of bladder, unspecified: Secondary | ICD-10-CM

## 2016-03-24 DIAGNOSIS — N323 Diverticulum of bladder: Secondary | ICD-10-CM | POA: Insufficient documentation

## 2016-03-24 DIAGNOSIS — H409 Unspecified glaucoma: Secondary | ICD-10-CM | POA: Insufficient documentation

## 2016-03-24 DIAGNOSIS — R3914 Feeling of incomplete bladder emptying: Secondary | ICD-10-CM | POA: Diagnosis not present

## 2016-03-24 DIAGNOSIS — N3090 Cystitis, unspecified without hematuria: Secondary | ICD-10-CM | POA: Insufficient documentation

## 2016-03-24 DIAGNOSIS — Z7951 Long term (current) use of inhaled steroids: Secondary | ICD-10-CM | POA: Insufficient documentation

## 2016-03-24 DIAGNOSIS — Z8554 Personal history of malignant neoplasm of ureter: Secondary | ICD-10-CM | POA: Insufficient documentation

## 2016-03-24 DIAGNOSIS — Z7982 Long term (current) use of aspirin: Secondary | ICD-10-CM | POA: Insufficient documentation

## 2016-03-24 DIAGNOSIS — N189 Chronic kidney disease, unspecified: Secondary | ICD-10-CM | POA: Insufficient documentation

## 2016-03-24 DIAGNOSIS — M6028 Foreign body granuloma of soft tissue, not elsewhere classified, other site: Secondary | ICD-10-CM | POA: Diagnosis not present

## 2016-03-24 HISTORY — PX: TRANSURETHRAL RESECTION OF BLADDER TUMOR WITH MITOMYCIN-C: SHX6459

## 2016-03-24 SURGERY — TRANSURETHRAL RESECTION OF BLADDER TUMOR WITH MITOMYCIN-C
Anesthesia: General | Site: Ureter | Wound class: Clean Contaminated

## 2016-03-24 MED ORDER — EPHEDRINE SULFATE 50 MG/ML IJ SOLN
INTRAMUSCULAR | Status: DC | PRN
  Administered 2016-03-24: 10 mg via INTRAVENOUS

## 2016-03-24 MED ORDER — KETOROLAC TROMETHAMINE 30 MG/ML IJ SOLN
INTRAMUSCULAR | Status: AC
  Filled 2016-03-24: qty 1

## 2016-03-24 MED ORDER — FENTANYL CITRATE (PF) 100 MCG/2ML IJ SOLN
INTRAMUSCULAR | Status: AC
  Filled 2016-03-24: qty 2

## 2016-03-24 MED ORDER — KETOROLAC TROMETHAMINE 30 MG/ML IJ SOLN
INTRAMUSCULAR | Status: DC | PRN
  Administered 2016-03-24: 30 mg via INTRAVENOUS

## 2016-03-24 MED ORDER — LIDOCAINE 2% (20 MG/ML) 5 ML SYRINGE
INTRAMUSCULAR | Status: AC
  Filled 2016-03-24: qty 5

## 2016-03-24 MED ORDER — FENTANYL CITRATE (PF) 100 MCG/2ML IJ SOLN
INTRAMUSCULAR | Status: DC | PRN
  Administered 2016-03-24 (×2): 50 ug via INTRAVENOUS

## 2016-03-24 MED ORDER — CEFAZOLIN IN D5W 1 GM/50ML IV SOLN
INTRAVENOUS | Status: AC
  Administered 2016-03-24: 1 g
  Filled 2016-03-24: qty 50

## 2016-03-24 MED ORDER — FENTANYL CITRATE (PF) 100 MCG/2ML IJ SOLN: 25.0000 ug | INTRAMUSCULAR | Status: DC | PRN

## 2016-03-24 MED ORDER — MITOMYCIN CHEMO FOR BLADDER INSTILLATION 40 MG
INTRAVENOUS | Status: DC | PRN
  Administered 2016-03-24: 40 mg via INTRAVESICAL

## 2016-03-24 MED ORDER — OXYCODONE HCL 5 MG PO TABS: 5.0000 mg | ORAL_TABLET | Freq: Once | ORAL | Status: DC | PRN

## 2016-03-24 MED ORDER — LACTATED RINGERS IV SOLN
INTRAVENOUS | Status: DC
  Administered 2016-03-24: 12:00:00 via INTRAVENOUS

## 2016-03-24 MED ORDER — PROPOFOL 10 MG/ML IV BOLUS
INTRAVENOUS | Status: DC | PRN
  Administered 2016-03-24: 200 mg via INTRAVENOUS

## 2016-03-24 MED ORDER — LIDOCAINE HCL (CARDIAC) 20 MG/ML IV SOLN
INTRAVENOUS | Status: DC | PRN
  Administered 2016-03-24: 50 mg via INTRAVENOUS

## 2016-03-24 MED ORDER — MITOMYCIN CHEMO FOR BLADDER INSTILLATION 40 MG
40.0000 mg | Freq: Once | INTRAVENOUS | Status: DC
  Filled 2016-03-24: qty 40

## 2016-03-24 MED ORDER — FAMOTIDINE 20 MG PO TABS
ORAL_TABLET | ORAL | Status: DC
Start: 2016-03-24 — End: 2016-03-24
  Filled 2016-03-24: qty 1

## 2016-03-24 MED ORDER — FAMOTIDINE 20 MG PO TABS
20.0000 mg | ORAL_TABLET | Freq: Once | ORAL | Status: AC
  Administered 2016-03-24: 20 mg via ORAL

## 2016-03-24 MED ORDER — CEFAZOLIN IN D5W 1 GM/50ML IV SOLN
1.0000 g | INTRAVENOUS | Status: AC
  Administered 2016-03-24: 1 g via INTRAVENOUS

## 2016-03-24 MED ORDER — OXYCODONE HCL 5 MG/5ML PO SOLN: 5.0000 mg | Freq: Once | ORAL | Status: DC | PRN

## 2016-03-24 MED ORDER — HYDROCODONE-ACETAMINOPHEN 5-325 MG PO TABS: 1.0000 | ORAL_TABLET | Freq: Four times a day (QID) | ORAL | 0 refills | Status: DC | PRN

## 2016-03-24 MED ORDER — PROPOFOL 10 MG/ML IV BOLUS
INTRAVENOUS | Status: AC
  Filled 2016-03-24: qty 20

## 2016-03-24 MED ORDER — ONDANSETRON HCL 4 MG/2ML IJ SOLN
INTRAMUSCULAR | Status: DC | PRN
  Administered 2016-03-24: 4 mg via INTRAVENOUS

## 2016-03-24 SURGICAL SUPPLY — 27 items
BAG DRAIN CYSTO-URO LG1000N (MISCELLANEOUS) ×3 IMPLANT
BAG URO DRAIN 2000ML W/SPOUT (MISCELLANEOUS) ×3 IMPLANT
CATH FOLEY 2WAY  5CC 16FR (CATHETERS) ×2
CATH URTH 16FR FL 2W BLN LF (CATHETERS) ×1 IMPLANT
DRAPE UTILITY 15X26 TOWEL STRL (DRAPES) ×3 IMPLANT
DRESSING TELFA 4X3 1S ST N-ADH (GAUZE/BANDAGES/DRESSINGS) ×6 IMPLANT
ELECT LOOP 22F BIPOLAR SML (ELECTROSURGICAL) ×3
ELECT REM PT RETURN 9FT ADLT (ELECTROSURGICAL)
ELECTRODE LOOP 22F BIPOLAR SML (ELECTROSURGICAL) ×1 IMPLANT
ELECTRODE REM PT RTRN 9FT ADLT (ELECTROSURGICAL) IMPLANT
GLOVE BIO SURGEON STRL SZ 6.5 (GLOVE) ×2 IMPLANT
GLOVE BIO SURGEONS STRL SZ 6.5 (GLOVE) ×1
GOWN STRL REUS W/ TWL LRG LVL3 (GOWN DISPOSABLE) ×2 IMPLANT
GOWN STRL REUS W/TWL LRG LVL3 (GOWN DISPOSABLE) ×4
KIT RM TURNOVER CYSTO AR (KITS) ×3 IMPLANT
LOOP CUT BIPOLAR 24F LRG (ELECTROSURGICAL) ×3 IMPLANT
NDL SAFETY ECLIPSE 18X1.5 (NEEDLE) ×1 IMPLANT
NEEDLE HYPO 18GX1.5 SHARP (NEEDLE) ×2
PACK CYSTO AR (MISCELLANEOUS) ×3 IMPLANT
SCRUB POVIDONE IODINE 4 OZ (MISCELLANEOUS) ×3 IMPLANT
SET IRRIG Y TYPE TUR BLADDER L (SET/KITS/TRAYS/PACK) ×3 IMPLANT
SET IRRIGATING DISP (SET/KITS/TRAYS/PACK) ×3 IMPLANT
SOL .9 NS 3000ML IRR  AL (IV SOLUTION) ×4
SOL .9 NS 3000ML IRR UROMATIC (IV SOLUTION) ×2 IMPLANT
SURGILUBE 2OZ TUBE FLIPTOP (MISCELLANEOUS) ×3 IMPLANT
SYRINGE IRR TOOMEY STRL 70CC (SYRINGE) ×3 IMPLANT
WATER STERILE IRR 1000ML POUR (IV SOLUTION) ×3 IMPLANT

## 2016-03-24 NOTE — Op Note (Signed)
Date of procedure: 03/24/16  Preoperative diagnosis:  1. History of bladder cancer 2. Right bladder neck tumor  Postoperative diagnosis:  1. same   Procedure: 1. TURBT, small 2. Installation of intravesical mitomycin  Surgeon: [name], MD  Anesthesia: General  Complications: None  Intraoperative findings: No obvious residual tumor, necrosis and scar from recent TURBT appreciated.  EBL: Minimal  Specimens: Right trigone/bladder neck tumor  Drains: 16 French Foley catheter  Indication: [name] is a 83 y.o. patient with history of recurrent bladder cancer including CIS/TA more recently underwent TURBT on 02/26/16 have high-grade T1 at the right bladder neck. He returns today for re-TUR.[name]  After reviewing the management options for treatment, he elected to proceed with the above surgical procedure(s). We have discussed the potential benefits and risks of the procedure, side effects of the proposed treatment, the likelihood of the patient achieving the goals of the procedure, and any potential problems that might occur during the procedure or recuperation. Informed consent has been obtained.  Description of procedure:  The patient was taken to the operating room and general anesthesia was induced.  The patient was placed in the dorsal lithotomy position, prepped and draped in the usual sterile fashion, and preoperative antibiotics were administered. A preoperative time-out was performed.   A 26 French resectoscope was advanced per urethra into the bladder using a blunt obturator. Upon entering the bladder, the bladder itself was carefully inspected. As previously, there was a large widemouth diverticulum appreciated on the left lateral bladder wall. There is some erythema and necrosis at the previous resection site involving the right bladder neck and surrounding but not involving the right UO consistent with recent previous resection. There is no obvious residual  tumor at this location. Diffusely throughout the bladder, there is no other suspicious regions or tumors appreciated. The loop of the bipolar resectoscope was used to take down the area in question to the muscular layer which could clearly be visible. The UO itself was spared. An area just lateral to the UO was also resected at just beyond there. A few deeper cone cup biopsy forceps were taken at the base. Hemostasis was achieved. Once complete, the scope was removed. A 16 French Foley catheter was placed. The patient was then cleaned and dried, repositioned the supine position, reversed anesthesia, taken to the PACU in stable condition.  40 mg of intravesical mitomycin was then instilled and allowed to dwell for present 1 hour and the PACU. This was well-tolerated. Once this was complete, the bladder was drained and the Foley was removed.  Plan: I'll call the patient to discuss his pathology results.  [name], M.D.

## 2016-03-24 NOTE — Anesthesia Postprocedure Evaluation (Signed)
Anesthesia Post Note  Patient: Anthony Gonzales  Procedure(s) Performed: Procedure(s) (LRB): TRANSURETHRAL RESECTION OF BLADDER TUMOR WITH MITOMYCIN-C  (SMALL) (N/A)  Patient location during evaluation: PACU Anesthesia Type: General Level of consciousness: awake and alert Pain management: pain level controlled Vital Signs Assessment: post-procedure vital signs reviewed and stable Respiratory status: spontaneous breathing, nonlabored ventilation, respiratory function stable and patient connected to nasal cannula oxygen Cardiovascular status: blood pressure returned to baseline and stable Postop Assessment: no signs of nausea or vomiting Anesthetic complications: no Comments: Patient endorses no sore throat or hoarseness.  Smaller LMA used for this case than the previous case where he had sore throat.     Last Vitals:  Vitals:   03/24/16 1411 03/24/16 1428  BP: (!) 141/87 (!) 144/68  Pulse: 87 72  Resp: 16 18  Temp:  36.6 C    Last Pain:  Vitals:   03/24/16 1325  TempSrc:   PainSc: Asleep                 Precious Haws Piscitello

## 2016-03-24 NOTE — Anesthesia Preprocedure Evaluation (Signed)
Anesthesia Evaluation  Patient identified by MRN, date of birth, ID band Patient awake    Reviewed: Allergy & Precautions, H&P , NPO status , Patient's Chart, lab work & pertinent test results  History of Anesthesia Complications (+) history of anesthetic complications (sore throat)  Airway Mallampati: III  TM Distance: <3 FB Neck ROM: limited    Dental  (+) Poor Dentition, Chipped, Caps   Pulmonary neg pulmonary ROS, neg shortness of breath,    Pulmonary exam normal breath sounds clear to auscultation       Cardiovascular Exercise Tolerance: Good hypertension, (-) Past MI and (-) DOE Normal cardiovascular exam Rhythm:regular Rate:Normal     Neuro/Psych  Headaches, negative psych ROS   GI/Hepatic negative GI ROS, Neg liver ROS,   Endo/Other  negative endocrine ROS  Renal/GU Renal disease     Musculoskeletal  (+) Arthritis ,   Abdominal   Peds  Hematology negative hematology ROS (+)   Anesthesia Other Findings Past Medical History: No date: Arthritis 08/23/2013: Benign fibroma of prostate No date: BPH (benign prostatic hyperplasia) 08/23/2013: Calculus of kidney No date: Cancer ([place])     Comment: bladder cancer and skin cancer No date: Glaucoma     Comment: no drops in 3 mo pressure good, pt denies               glaucoma, eye pressure has been measuring               alright. No date: History of kidney stones No date: HLD (hyperlipidemia) No date: HOH (hard of hearing)     Comment: Left Hearing Aid 12/26/2014: HTN (hypertension) No date: Hypertension 12/26/2014: Hyponatremia No date: Migraines     Comment: history of migraines when he was younger. No date: Restless leg syndrome No date: Sinus drainage 12/26/2014: UTI (lower urinary tract infection) No date: Vertigo  Past Surgical History: No date: COLONOSCOPY 01/30/2015: CYSTOSCOPY W/ RETROGRADES Right     Comment: Procedure: CYSTOSCOPY WITH  RETROGRADE               PYELOGRAM;  Surgeon: [name], MD;                Location: [facility];  Service: Urology;                Laterality: Right; 02/26/2016: CYSTOSCOPY W/ RETROGRADES Bilateral     Comment: Procedure: CYSTOSCOPY WITH RETROGRADE               PYELOGRAM;  Surgeon: [name], MD;                Location: [facility];  Service: Urology;                Laterality: Bilateral; 08/20/2015: CYSTOSCOPY W/ URETERAL STENT PLACEMENT Right     Comment: Procedure: CYSTOSCOPY WITH RETROGRADE               PYELOGRAM/POSSIBLE URETERAL STENT               PLACEMENT/BLADDER BIOPSY;  Surgeon: [name], MD;  Location: [facility];  Service:               Urology;  Laterality: Right; 09/12/2015: CYSTOSCOPY W/ URETERAL STENT PLACEMENT Right     Comment: Procedure: CYSTOSCOPY WITH STENT REPLACEMENT;               Surgeon: [name], MD;  Location:  [facility]               ORS;  Service: Urology;  Laterality: Right; 09/12/2015: CYSTOSCOPY WITH BIOPSY Right     Comment: Procedure: CYSTOSCOPY WITH BLADDER AND               URETERAL BIOPSY;  Surgeon: [name], MD;               Location: [facility];  Service: Urology;                Laterality: Right; 01/30/2015: CYSTOSCOPY WITH STENT PLACEMENT Right     Comment: Procedure: CYSTOSCOPY WITH STENT PLACEMENT;                Surgeon: [name], MD;  Location: [facility]               ORS;  Service: Urology;  Laterality: Right; No date: EYE SURGERY Bilateral     Comment: Cataract Extraction with IOL 08/20/2015: HOLMIUM LASER APPLICATION N/A     Comment: Procedure:  HOLMIUM LASER APPLICATION;                Surgeon: [name], MD;  Location: [facility]               ORS;  Service: Urology;  Laterality: N/A; No date: SPERMATOCELECTOMY No date: TONSILLECTOMY 02/26/2016: TRANSURETHRAL RESECTION OF BLADDER TUMOR N/A     Comment: Procedure: TRANSURETHRAL RESECTION OF BLADDER               TUMOR (TURBT);  Surgeon: [name],  MD;                Location: [facility];  Service: Urology;                Laterality: N/A; 09/12/2015: TRANSURETHRAL RESECTION OF BLADDER TUMOR WITH * N/A     Comment: Procedure: TRANSURETHRAL RESECTION OF BLADDER               TUMOR ;  Surgeon: [name], MD;                Location: [facility];  Service: Urology;                Laterality: N/A; 01/30/2015: URETEROSCOPY Right     Comment: Procedure: URETEROSCOPY/ WITH BIOPSY AND               CYTOLOGY BRUSHING;  Surgeon: [name],               MD;  Location: [facility];  Service: Urology;                Laterality: Right; 08/20/2015: URETEROSCOPY Right     Comment: Procedure: URETEROSCOPY;  Surgeon: [name], MD;  Location: [facility];  Service:               Urology;  Laterality: Right; 09/12/2015: URETEROSCOPY Right     Comment: Procedure: URETEROSCOPY;  Surgeon: [name], MD;  Location: [facility];  Service:               Urology;  Laterality: Right; 02/26/2016: URETEROSCOPY Right     Comment: Procedure: URETEROSCOPY;  Surgeon: [name], MD;  Location: [facility];  Service:  Urology;  Laterality: Right;  BMI    Body Mass Index:  27.34 kg/m      Reproductive/Obstetrics negative OB ROS                             Anesthesia Physical Anesthesia Plan  ASA: III  Anesthesia Plan: General LMA   Post-op Pain Management:    Induction:   Airway Management Planned:   Additional Equipment:   Intra-op Plan:   Post-operative Plan:   Informed Consent: I have reviewed the patients History and Physical, chart, labs and discussed the procedure including the risks, benefits and alternatives for the proposed anesthesia with the patient or authorized representative who has indicated his/her understanding and acceptance.   Dental Advisory Given  Plan Discussed with: Anesthesiologist, CRNA and Surgeon  Anesthesia Plan Comments:  (Discussion with patient about GA LMA vs spinal because of history or sore throat.  I informed him that he is at increased risk for sore throat due to his history of sore throat.  Patient voiced understanding.  He requests GA so that he does not have to wait for the spinal to wear off.)        Anesthesia Quick Evaluation

## 2016-03-24 NOTE — H&P (Signed)
2:46 PM   --> updated 03/24/16    S/p TURBT on 02/26/16.    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  No muscle in the specimen.   Recommended return to OR for re-TUR with mitomycin per AUA/ NCCN guidelines due to risk up upstaging.  Risks and benefits reviewed in detail.    RRR/ CTAB      02/06/16  Anthony Gonzales [birth year] [id number]  Referring provider: Idelle Crouch, MD [facility] [place], [place] [address]     Chief Complaint  Patient presents with  . Cysto    HPI:  83 year old male who initially presented with asymptomatic incidental moderate right hydroureteronephrosis in 12/2014.  He is taken to the OR on 01/30/2015 at which time right ureteroscopy revealed as suspicious area within the right distal ureter with a shaggy, irregular appearance but no obvious papillary or pedunculated tumor. Brush cytology was negative for any malignancy.   Ureteral biopsy revealed some atypia favoring neoplasm but essentially was non-diagnostic.   Interval follow-up renal ultrasound on 05/02/2015 did show complete resolution of his hydronephrosis.  He was counseled to return for a second look but deferred this for ~6 months.  He eventually return to the OR on 08/20/15 at which time the right distal ureteral was highly suspicious for papillary urothelial carcinoma was was biopsied. 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   He returned to operating room on 09/12/2015 for a second look ureteroscopy/biopsy and TURBT. Given the depth of resection, mitomycin was not administered.  Pathology showed no residual tumor in the bladder or the distal ureter. A Bard Optima stent was replaced at the time of the procedure.  He completed an induction course of BCG  completed on 11/30/2015 6 doses. This was performed with the stent in place.  The stent was subsequently removed.  Never smoker.     He does have baseline chronic renal insufficiency.  Cr 1.6-2.   He also has a history of recurrent nephrolithiasis although no stone seen on CT scan as of 12/2014.  He is history of BPH and postop urinary retention, occasionally CIC's as needed.  He returns to the office today for surveillance cystoscopy.  Most recent renal ultrasound on 01/30/2016 shows no evidence of hydronephrosis bilaterally.   PMH:     Past Medical History:  Diagnosis Date  . Arthritis   . Benign fibroma of prostate 08/23/2013  . BPH (benign prostatic hyperplasia)   . Calculus of kidney 08/23/2013  . Cancer ([place])    skin  . Glaucoma    no drops in 3 mo pressure good, pt denies glaucoma, eye pressure has been measuring alright.  [name] HLD (hyperlipidemia)   . HTN (hypertension) 12/26/2014  . Hypertension   . Hyponatremia 12/26/2014  . Migraines    history of migraines when he was younger.  [name] Restless leg syndrome   . UTI (lower urinary tract infection) 12/26/2014  . Vertigo     Surgical History:      Past Surgical History:  Procedure Laterality Date  . COLONOSCOPY    . CYSTOSCOPY W/ RETROGRADES Right 01/30/2015   Procedure: CYSTOSCOPY WITH RETROGRADE PYELOGRAM;  Surgeon: [name], MD;  Location: [facility];  Service: Urology;  Laterality: Right;  . CYSTOSCOPY W/ URETERAL STENT PLACEMENT Right 08/20/2015  Procedure: CYSTOSCOPY WITH RETROGRADE PYELOGRAM/POSSIBLE URETERAL STENT PLACEMENT/BLADDER BIOPSY;  Surgeon: [name], MD;  Location: [facility];  Service: Urology;  Laterality: Right;  . CYSTOSCOPY W/ URETERAL STENT PLACEMENT Right 09/12/2015   Procedure: CYSTOSCOPY WITH STENT REPLACEMENT;  Surgeon: [name], MD;  Location: [facility];  Service: Urology;  Laterality: Right;  . CYSTOSCOPY WITH BIOPSY Right 09/12/2015   Procedure:  CYSTOSCOPY WITH BLADDER AND URETERAL BIOPSY;  Surgeon: [name], MD;  Location: [facility];  Service: Urology;  Laterality: Right;  . CYSTOSCOPY WITH STENT PLACEMENT Right 01/30/2015   Procedure: CYSTOSCOPY WITH STENT PLACEMENT;  Surgeon: [name], MD;  Location: [facility];  Service: Urology;  Laterality: Right;  . EYE SURGERY Bilateral    Cataract Extraction with IOL  . HOLMIUM LASER APPLICATION N/A [id number]/[id number]   Procedure:  HOLMIUM LASER APPLICATION;  Surgeon: [name], MD;  Location: [facility];  Service: Urology;  Laterality: N/A;  . SPERMATOCELECTOMY    . TONSILLECTOMY    . TRANSURETHRAL RESECTION OF BLADDER TUMOR WITH MITOMYCIN-C N/A 09/12/2015   Procedure: TRANSURETHRAL RESECTION OF BLADDER TUMOR ;  Surgeon: [name], MD;  Location: [facility];  Service: Urology;  Laterality: N/A;  . URETEROSCOPY Right 01/30/2015   Procedure: URETEROSCOPY/ WITH BIOPSY AND CYTOLOGY BRUSHING;  Surgeon: [name], MD;  Location: [facility];  Service: Urology;  Laterality: Right;  . URETEROSCOPY Right 08/20/2015   Procedure: URETEROSCOPY;  Surgeon: [name], MD;  Location: [facility];  Service: Urology;  Laterality: Right;  . URETEROSCOPY Right 09/12/2015   Procedure: URETEROSCOPY;  Surgeon: [name], MD;  Location: [facility];  Service: Urology;  Laterality: Right;    Home Medications:        Medication List           Accurate as of 02/06/16 11:59 PM. Always use your most recent med list.           aspirin EC 81 MG tablet Take 81 mg by mouth. At supper.  benazepril-hydrochlorthiazide 20-12.5 MG tablet Commonly known as:  LOTENSIN HCT Take 1 tablet by mouth daily. In am.  ciprofloxacin 500 MG tablet Commonly known as:  CIPRO Take 1 tablet (500 mg total) by mouth every 12 (twelve) hours.  docusate sodium 100 MG capsule Commonly known as:  COLACE Take 1 capsule (100 mg total) by mouth 2 (two) times daily.  DYMISTA 137-50 MCG/ACT Susp Generic drug:   Azelastine-Fluticasone PLACE 1 SPRAY INTO BOTH NOSTRILS 2 (TWO) TIMES DAILY.  fenofibrate micronized 134 MG capsule Commonly known as:  LOFIBRA Take 134 mg by mouth daily before breakfast.  Fish Oil 1200 MG Caps Take 1 mg by mouth 2 (two) times daily.  multivitamin capsule Take 1 capsule by mouth every morning.  tamsulosin 0.4 MG Caps capsule Commonly known as:  FLOMAX TAKE ONE CAPSULE BY MOUTH DAILY  TGT PSYLLIUM FIBER 0.52 g capsule Generic drug:  psyllium Take 0.52 g by mouth as needed.      Allergies:       Allergies  Allergen Reactions  . Demerol [Meperidine] Nausea And Vomiting    Other reaction(s): Unknown Other reaction(s): Unknown  . Lipitor [Atorvastatin] Swelling  . Septra [Sulfamethoxazole-Trimethoprim] Nausea And Vomiting    Per patient "this medication made me feel worse since starting this antibiotic".    . Sulfa Antibiotics Rash    Family History:      Family History  Problem Relation Age of Onset  . Hypertension Mother   . Hypertension Father   . Prostate cancer  Brother   . Kidney disease Neg Hx     Social History:  reports that he has never smoked. He has never used smokeless tobacco. He reports that he does not drink alcohol or use drugs.   Physical Exam: BP 131/82   Pulse 70   Ht 6' (1.829 m)   Wt 193 lb 9.6 oz (87.8 kg)   BMI 26.26 kg/m   Constitutional:  Alert and oriented, No acute distress.   HEENT: [place] AT, moist mucus membranes.  Trachea midline, no masses. Cardiovascular: No clubbing, cyanosis, or edema.   Respiratory: Normal respiratory effort, no increased work of breathing.  GU: Normal phallus with was the topic meatus, widely patent Skin: No rashes, bruises or suspicious lesions. Neurologic: Grossly intact, no focal deficits, moving all 4 extremities. Psychiatric: Normal mood and affect.  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 No hydronephrosis is noted. There is a cyst in the lower pole of the right kidney measuring 3.4 cm.  Left Kidney:  Length: 11.5 cm. No hydronephrosis is seen.  Bladder:  The urinary bladder is not well distended but no abnormality is seen. The prostate is prominent measuring 5.4 x 4.2 x 4.3 cm.  IMPRESSION: 1. No hydronephrosis. 2. Prominent prostate.   Electronically Signed By: Ivar Drape M.D. On: 01/30/2016 13:34  RUS reviewed personally today  Cystoscopy Procedure Note  Patient identification was confirmed, informed consent was obtained, and patient was prepped using Betadine solution.  Lidocaine jelly was administered per urethral meatus.    Preoperative abx where received prior to procedure.     Pre-Procedure: - Inspection reveals a normal caliber ureteral meatus.  Procedure: The flexible cystoscope was introduced without difficulty - No urethral strictures/lesions are present. - Normal prostate  - Bladder neck lesion appreciated at ~9 oc lock position - Bilateral ureteral orifices identified - Bladder mucosa  reveals no ulcers, tumors, or lesions - No bladder stones - moderate trabeculation  Retroflexion shows approximately 1 cm nodular mass at the bladder neck around the 9:00 position suspicious for tumor recurrence.   Post-Procedure: - Patient tolerated the procedure well   Assessment & Plan:    1. 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 Risks and benefits reviewed again in detail. He's previously undergone the procedure and is familiar.  2. Right distal ureteral cancer Previously no interested in distal ureterectomy/ nephrou S/p multiple biopsies/ laser ablation with stent/ BCG course In presence of bladder neck lesion, we will also reinspect right distal ureter in the OR.  Plan for URS/ RTG/ possible biospy.  3. History of nephrolithiasis Currently stone free.               4. Nocturia/ history of retention/ BPH with sensation of incomplete bladder emtying Continue Flomax CIC as needed PVR minimal previously  [name], MD  [facility] [address] [place], [place] [address] [phone]     Electronically signed by [name], MD at 02/10/2016 2:57 PM

## 2016-03-24 NOTE — Anesthesia Procedure Notes (Signed)
Procedure Name: LMA Insertion Date/Time: 03/24/2016 12:39 PM Performed by: Jonna Clark Pre-anesthesia Checklist: Patient identified, Patient being monitored, Timeout performed, Emergency Drugs available and Suction available Patient Re-evaluated:Patient Re-evaluated prior to inductionOxygen Delivery Method: Circle system utilized Preoxygenation: Pre-oxygenation with 100% oxygen Intubation Type: IV induction Ventilation: Mask ventilation without difficulty LMA: LMA inserted LMA Size: 4.0 Tube type: Oral Number of attempts: 1 Placement Confirmation: positive ETCO2 and breath sounds checked- equal and bilateral Tube secured with: Tape Dental Injury: Teeth and Oropharynx as per pre-operative assessment

## 2016-03-24 NOTE — Discharge Instructions (Signed)
Transurethral Resection of Bladder Tumor (TURBT) or Bladder Biopsy   Definition:  Transurethral Resection of the Bladder Tumor is a surgical procedure used to diagnose and remove tumors within the bladder. TURBT is the most common treatment for early stage bladder cancer.  General instructions:     Your recent bladder surgery requires very little post hospital care but some definite precautions.  Despite the fact that no skin incisions were used, the area around the bladder incisions are raw and covered with scabs to promote healing and prevent bleeding. Certain precautions are needed to insure that the scabs are not disturbed over the next 2-4 weeks while the healing proceeds.  Because the raw surface inside your bladder and the irritating effects of urine you may expect frequency of urination and/or urgency (a stronger desire to urinate) and perhaps even getting up at night more often. This will usually resolve or improve slowly over the healing period. You may see some blood in your urine over the first 6 weeks. Do not be alarmed, even if the urine was clear for a while. Get off your feet and drink lots of fluids until clearing occurs. If you start to pass clots or don't improve call us.  Diet:  You may return to your normal diet immediately. Because of the raw surface of your bladder, alcohol, spicy foods, foods high in acid and drinks with caffeine may cause irritation or frequency and should be used in moderation. To keep your urine flowing freely and avoid constipation, drink plenty of fluids during the day (8-10 glasses). Tip: Avoid cranberry juice because it is very acidic.  Activity:  Your physical activity doesn't need to be restricted. However, if you are very active, you may see some blood in the urine. We suggest that you reduce your activity under the circumstances until the bleeding has stopped.  Bowels:  It is important to keep your bowels regular during the postoperative  period. Straining with bowel movements can cause bleeding. A bowel movement every other day is reasonable. Use a mild laxative if needed, such as milk of magnesia 2-3 tablespoons, or 2 Dulcolax tablets. Call if you continue to have problems. If you had been taking narcotics for pain, before, during or after your surgery, you may be constipated. Take a laxative if necessary.    Medication:  You should resume your pre-surgery medications unless told not to. In addition you may be given an antibiotic to prevent or treat infection. Antibiotics are not always necessary. All medication should be taken as prescribed until the bottles are finished unless you are having an unusual reaction to one of the drugs.   [facility] [address], [place], [place] [address] [phone]  AMBULATORY SURGERY  DISCHARGE INSTRUCTIONS   1) The drugs that you were given will stay in your system until tomorrow so for the next 24 hours you should not:  A) Drive an automobile B) Make any legal decisions C) Drink any alcoholic beverage   2) You may resume regular meals tomorrow.  Today it is better to start with liquids and gradually work up to solid foods.  You may eat anything you prefer, but it is better to start with liquids, then soup and crackers, and gradually work up to solid foods.   3) Please notify your doctor immediately if you have any unusual bleeding, trouble breathing, redness and pain at the surgery site, drainage, fever, or pain not relieved by medication.    4) Additional  Instructions:        Please contact your physician with any problems or Same Day Surgery at [phone], Monday through Friday 6 am to 4 pm, or [place] at [facility] number at [phone].

## 2016-03-24 NOTE — Transfer of Care (Signed)
Immediate Anesthesia Transfer of Care Note  Patient: Anthony Gonzales  Procedure(s) Performed: Procedure(s): TRANSURETHRAL RESECTION OF BLADDER TUMOR WITH MITOMYCIN-C  (SMALL) (N/A)  Patient Location: PACU  Anesthesia Type:General  Level of Consciousness: patient cooperative and lethargic  Airway & Oxygen Therapy: Patient Spontanous Breathing and Patient connected to face mask oxygen  Post-op Assessment: Report given to RN and Post -op Vital signs reviewed and stable  Post vital signs: Reviewed and stable  Last Vitals:  Vitals:   03/24/16 1135 03/24/16 1325  BP: (!) 141/78 (!) 108/52  Pulse: 74 67  Resp: 18 15  Temp: 36.8 C 36.3 C    Last Pain:  Vitals:   03/24/16 1135  TempSrc: Oral         Complications: No apparent anesthesia complications

## 2016-03-25 ENCOUNTER — Ambulatory Visit: Payer: PPO | Admitting: Urology

## 2016-03-25 LAB — SURGICAL PATHOLOGY

## 2016-03-26 ENCOUNTER — Telehealth: Payer: Self-pay | Admitting: Urology

## 2016-03-28 NOTE — Telephone Encounter (Signed)
Pt returned your call.  He said he would be at home.

## 2016-03-29 NOTE — Telephone Encounter (Signed)
Re-TUR results reviewed.  No evidence of residual tumor.    Recommend another induction course of BCG x 6.  Patient previously tolerated this well.  All questions answered.     [name], MD

## 2016-03-31 ENCOUNTER — Telehealth: Payer: Self-pay | Admitting: Urology

## 2016-03-31 NOTE — Telephone Encounter (Signed)
DONE AND MAILED TO PATIENT

## 2016-04-01 ENCOUNTER — Ambulatory Visit: Payer: PPO | Admitting: Urology

## 2016-04-08 ENCOUNTER — Ambulatory Visit: Payer: PPO | Admitting: Urology

## 2016-04-29 ENCOUNTER — Ambulatory Visit: Payer: PPO | Admitting: Urology

## 2016-04-29 ENCOUNTER — Encounter: Payer: Self-pay | Admitting: Urology

## 2016-04-29 VITALS — BP 167/75 | HR 76 | Ht 71.0 in | Wt 197.0 lb

## 2016-04-29 DIAGNOSIS — C679 Malignant neoplasm of bladder, unspecified: Secondary | ICD-10-CM

## 2016-04-29 DIAGNOSIS — Z87442 Personal history of urinary calculi: Secondary | ICD-10-CM

## 2016-04-29 DIAGNOSIS — C661 Malignant neoplasm of right ureter: Secondary | ICD-10-CM | POA: Diagnosis not present

## 2016-04-29 DIAGNOSIS — R351 Nocturia: Secondary | ICD-10-CM

## 2016-04-29 LAB — URINALYSIS, COMPLETE
BILIRUBIN UA: NEGATIVE
GLUCOSE, UA: NEGATIVE
KETONES UA: NEGATIVE
Leukocytes, UA: NEGATIVE
Nitrite, UA: NEGATIVE
PH UA: 7.5 (ref 5.0–7.5)
Protein, UA: NEGATIVE
Specific Gravity, UA: 1.015 (ref 1.005–1.030)
Urobilinogen, Ur: 0.2 mg/dL (ref 0.2–1.0)

## 2016-04-29 LAB — MICROSCOPIC EXAMINATION: BACTERIA UA: NONE SEEN

## 2016-04-29 MED ORDER — BCG LIVE 50 MG IS SUSR
3.2400 mL | Freq: Once | INTRAVESICAL | Status: AC
  Administered 2016-04-29: 81 mg via INTRAVESICAL

## 2016-04-29 NOTE — Progress Notes (Signed)
BCG Bladder Instillation  BCG # 1  Due to Bladder Cancer patient is present today for a BCG treatment. Patient was cleaned and prepped in a sterile fashion with betadine and lidocaine 2% jelly was instilled into the urethra.  A 14FR coude catheter was inserted, urine return was noted 57m, urine was yellow in color.  540mof reconstituted BCG was instilled into the bladder. The catheter was then removed. Patient tolerated well, no complications were noted  Preformed by: ShZara CouncilPAC and SaFonnie JarvisCMA  Follow up/ Additional notes: next week for BCG number #2

## 2016-04-29 NOTE — Progress Notes (Signed)
04/29/2016 11:49 AM   Anthony Gonzales 1931-08-21 [id number]  Referring provider: Idelle Crouch, MD [facility] [place], [place] [address]  Chief Complaint  Patient presents with  . Bladder Cancer    BCG #1-     HPI: 83 yo WM who presents today for an induction series of BCG, this will be #1/6.    Patient initially presented with asymptomatic incidental moderate right hydroureteronephrosis in 12/2014.  He is taken to the OR on 01/30/2015 at which time right ureteroscopy revealed as suspicious area within the right distal ureter with a shaggy, irregular appearance but no obvious papillary or pedunculated tumor. Brush cytology was negative for any malignancy.   Ureteral biopsy revealed some atypia favoring neoplasm but essentially was non-diagnostic.  Interval follow-up renal ultrasound on 05/02/2015 did show complete resolution of his hydronephrosis.  He was counseled to return for a second look but deferred this for ~6 months.  He eventually return to the OR on 08/20/15 at which time the right distal ureteral was highly suspicious for papillary urothelial carcinoma was biopsied. 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  He returned to operating room on 09/12/2015 for a second look ureteroscopy/biopsy and TURBT. Given the depth of resection, mitomycin was not administered.  Pathology showed no residual tumor in the bladder or the distal ureter. A Bard Optima stent was replaced at the time of the procedure.  He completed an induction course of BCG completed on 11/30/2015 6 doses. This was performed with the stent in place.  The stent was subsequently removed.  Most recent renal ultrasound on 01/30/2016 shows no evidence of hydronephrosis bilaterally.  Surveillance cystoscopy performed on 02/06/2016 noted possible  recurrence at bladder neck- recommend TURBT with mitomycin.  He was taken to the OR on 02/26/2016 for TURBT, bilateral RTG's and right ureteroscopy.  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  No muscle in the specimen.  Recommend return to OR for re-TUR with mitomycin per AUA/ NCCN guidelines due to risk up upstaging.  Re-TUR occurred on 03/24/2016.  Pathology noted no evidence of residual tumor.     Never smoker.   He does have baseline chronic renal insufficiency.  Cr 1.6-2.   He also has a history of recurrent nephrolithiasis although no stone seen on CT scan as of 12/2014.  He is history of BPH and postop urinary retention, occasionally CIC's as needed.  Today, he presents today for induction course of BCG.   He questions are answered and he is ready to proceed.  He has no urinary complaints at this time.  He has not had a new cough.  He denies dysuria, gross hematuria and suprapubic pain.  He denies fevers, chills, nausea or vomiting.  His UA today is unremarkable.     PMH: Past Medical History:  Diagnosis Date  . Arthritis   . Benign fibroma of prostate 08/23/2013  . BPH (benign prostatic hyperplasia)   . Calculus of kidney 08/23/2013  . Cancer [facility])    bladder cancer and skin cancer  . Glaucoma    no drops in 3 mo pressure good, pt denies glaucoma, eye pressure has been measuring alright.  . History of kidney stones   . HLD (hyperlipidemia)   . HOH (hard of hearing)    Left  Hearing Aid  . HTN (hypertension) 12/26/2014  . Hypertension   . Hyponatremia 12/26/2014  . Migraines    history of migraines when he was younger.  [name] Restless leg syndrome   . Sinus drainage   . UTI (lower urinary tract infection) 12/26/2014  . Vertigo     Surgical History: Past Surgical History:  Procedure Laterality Date  . COLONOSCOPY    . CYSTOSCOPY W/ RETROGRADES Right 01/30/2015   Procedure: CYSTOSCOPY WITH RETROGRADE PYELOGRAM;   Surgeon: [name], MD;  Location: [facility];  Service: Urology;  Laterality: Right;  . CYSTOSCOPY W/ RETROGRADES Bilateral 02/26/2016   Procedure: CYSTOSCOPY WITH RETROGRADE PYELOGRAM;  Surgeon: [name], MD;  Location: [facility];  Service: Urology;  Laterality: Bilateral;  . CYSTOSCOPY W/ URETERAL STENT PLACEMENT Right 08/20/2015   Procedure: CYSTOSCOPY WITH RETROGRADE PYELOGRAM/POSSIBLE URETERAL STENT PLACEMENT/BLADDER BIOPSY;  Surgeon: [name], MD;  Location: [facility];  Service: Urology;  Laterality: Right;  . CYSTOSCOPY W/ URETERAL STENT PLACEMENT Right 09/12/2015   Procedure: CYSTOSCOPY WITH STENT REPLACEMENT;  Surgeon: [name], MD;  Location: [facility];  Service: Urology;  Laterality: Right;  . CYSTOSCOPY WITH BIOPSY Right 09/12/2015   Procedure: CYSTOSCOPY WITH BLADDER AND URETERAL BIOPSY;  Surgeon: [name], MD;  Location: [facility];  Service: Urology;  Laterality: Right;  . CYSTOSCOPY WITH STENT PLACEMENT Right 01/30/2015   Procedure: CYSTOSCOPY WITH STENT PLACEMENT;  Surgeon: [name], MD;  Location: [facility];  Service: Urology;  Laterality: Right;  . EYE SURGERY Bilateral    Cataract Extraction with IOL  . HOLMIUM LASER APPLICATION N/A 9/56/2130   Procedure:  HOLMIUM LASER APPLICATION;  Surgeon: [name], MD;  Location: [facility];  Service: Urology;  Laterality: N/A;  . SPERMATOCELECTOMY    . TONSILLECTOMY    . TRANSURETHRAL RESECTION OF BLADDER TUMOR N/A 02/26/2016   Procedure: TRANSURETHRAL RESECTION OF BLADDER TUMOR (TURBT);  Surgeon: [name], MD;  Location: [facility];  Service: Urology;  Laterality: N/A;  . TRANSURETHRAL RESECTION OF BLADDER TUMOR WITH MITOMYCIN-C N/A 09/12/2015   Procedure: TRANSURETHRAL RESECTION OF BLADDER TUMOR ;  Surgeon: [name], MD;  Location: [facility];  Service: Urology;  Laterality: N/A;  . TRANSURETHRAL RESECTION OF BLADDER TUMOR WITH MITOMYCIN-C N/A 03/24/2016   Procedure: TRANSURETHRAL RESECTION OF BLADDER  TUMOR WITH MITOMYCIN-C  (SMALL);  Surgeon: [name], MD;  Location: [facility];  Service: Urology;  Laterality: N/A;  . URETEROSCOPY Right 01/30/2015   Procedure: URETEROSCOPY/ WITH BIOPSY AND CYTOLOGY BRUSHING;  Surgeon: [name], MD;  Location: [facility];  Service: Urology;  Laterality: Right;  . URETEROSCOPY Right 08/20/2015   Procedure: URETEROSCOPY;  Surgeon: [name], MD;  Location: [facility];  Service: Urology;  Laterality: Right;  . URETEROSCOPY Right 09/12/2015   Procedure: URETEROSCOPY;  Surgeon: [name], MD;  Location: [facility];  Service: Urology;  Laterality: Right;  . URETEROSCOPY Right 02/26/2016   Procedure: URETEROSCOPY;  Surgeon: [name], MD;  Location: [facility];  Service: Urology;  Laterality: Right;    Home Medications:  Allergies as of 04/29/2016      Reactions   Demerol [meperidine] Nausea And Vomiting   Lipitor [atorvastatin] Swelling   Sulfa Antibiotics Nausea And Vomiting, Rash      Medication List       Accurate as of 04/29/16 11:49 AM. Always use your most recent med list.          acetaminophen 500 MG tablet Commonly known as:  TYLENOL Take 1,000 mg by mouth every 6 (  six) hours as needed for moderate pain.   aspirin EC 81 MG tablet Take 81 mg by mouth daily.   benazepril-hydrochlorthiazide 20-12.5 MG tablet Commonly known as:  LOTENSIN HCT Take 1 tablet by mouth daily. In am.   docusate sodium 100 MG capsule Commonly known as:  COLACE Take 1 capsule (100 mg total) by mouth 2 (two) times daily.   fenofibrate micronized 134 MG capsule Commonly known as:  LOFIBRA Take 134 mg by mouth daily before breakfast.   Fish Oil 1200 MG Caps Take 1,200 mg by mouth 2 (two) times daily.   HYDROcodone-acetaminophen 5-325 MG tablet Commonly known as:  NORCO/VICODIN Take 1-2 tablets by mouth every 6 (six) hours as needed for moderate pain.   multivitamin with minerals Tabs tablet Take 1 tablet by mouth daily.   psyllium 58.6 %  packet Commonly known as:  METAMUCIL Take 1 packet by mouth daily.   tamsulosin 0.4 MG Caps capsule Commonly known as:  FLOMAX TAKE ONE CAPSULE BY MOUTH DAILY       Allergies:  Allergies  Allergen Reactions  . Demerol [Meperidine] Nausea And Vomiting  . Lipitor [Atorvastatin] Swelling  . Sulfa Antibiotics Nausea And Vomiting and Rash    Family History: Family History  Problem Relation Age of Onset  . Hypertension Mother   . Hypertension Father   . Prostate cancer Brother   . Kidney disease Neg Hx     Social History:  reports that he has never smoked. He has never used smokeless tobacco. He reports that he does not drink alcohol or use drugs.  ROS: UROLOGY Frequent Urination?: No Hard to postpone urination?: No Burning/pain with urination?: No Get up at night to urinate?: No Leakage of urine?: No Urine stream starts and stops?: No Trouble starting stream?: No Do you have to strain to urinate?: No Blood in urine?: No Urinary tract infection?: No Sexually transmitted disease?: No Injury to kidneys or bladder?: No Painful intercourse?: No Weak stream?: No Erection problems?: No Penile pain?: No  Gastrointestinal Nausea?: No Vomiting?: No Indigestion/heartburn?: No Diarrhea?: No Constipation?: No  Constitutional Fever: No Night sweats?: No Weight loss?: No Fatigue?: No  Skin Skin rash/lesions?: No Itching?: No  Eyes Blurred vision?: No Double vision?: No  Ears/Nose/Throat Sore throat?: No Sinus problems?: No  Hematologic/Lymphatic Swollen glands?: No Easy bruising?: No  Cardiovascular Leg swelling?: No Chest pain?: No  Respiratory Cough?: No Shortness of breath?: No  Endocrine Excessive thirst?: No  Musculoskeletal Back pain?: No Joint pain?: No  Neurological Headaches?: No Dizziness?: No  Psychologic Depression?: No Anxiety?: No  Physical Exam: BP (!) 167/75   Pulse 76   Ht '5\' 11"'$  (1.803 m)   Wt 197 lb (89.4 kg)    BMI 27.48 kg/m   Constitutional: Well nourished. Alert and oriented, No acute distress. HEENT: [place] AT, moist mucus membranes. Trachea midline, no masses. Cardiovascular: No clubbing, cyanosis, or edema. Respiratory: Normal respiratory effort, no increased work of breathing. GI: Abdomen is soft, non tender, non distended, no abdominal masses. Liver and spleen not palpable.  No hernias appreciated.  Stool sample for occult testing is not indicated.   GU: No CVA tenderness.  No bladder fullness or masses.   Skin: No rashes, bruises or suspicious lesions. Lymph: No cervical or inguinal adenopathy. Neurologic: Grossly intact, no focal deficits, moving all 4 extremities. Psychiatric: Normal mood and affect.  Laboratory Data: Lab Results  Component Value Date   WBC 6.5 03/17/2016   HGB 13.9 03/17/2016  HCT 41.2 03/17/2016   MCV 88.0 03/17/2016   PLT 260 03/17/2016    Lab Results  Component Value Date   CREATININE 1.34 (H) 03/17/2016    Lab Results  Component Value Date   AST 28 02/04/2015   Lab Results  Component Value Date   ALT 21 02/04/2015    Urinalysis Unremarkable.  See EPIC.     Assessment & Plan:    1. 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  No muscle in the specimen.  Recommend return to OR for re-TUR with mitomycin per AUA/ NCCN guidelines due to risk up upstaging.  Re-TUR occurred on 03/24/2016.  Pathology noted no evidence of residual tumor.    - Reviewed BCG treatment course, possible side effects including BCG sepsis, bladder irritation, worsening of her urinary symptoms  - # 1 of 6 BCG installed today  - Patient was instructed to pour bleach down his toilet for the next 6 hours  -  Instructed to call the office if she should  experience fevers greater than 102, chills/rigors, onset of a new cough, night sweats or further bladder spasms or inability to urinate   - RTC in one week for # 2 BCG  - Surveillance protocol also discussed today including cystoscopy every 3 months for at least 2 years and then spread out thereafter  - Urinalysis, Complete  - bcg vaccine injection 81 mg; Instill 3.24 mLs (81 mg total) into the bladder once.  - lidocaine (XYLOCAINE) 2 % jelly 1 application; Place 1 application into the urethra once.  2. Right distal ureteral cancer Previously no interested in distal ureterectomy/ nephrou S/p multiple biopsies/ laser ablation with stent/ BCG course In presence of bladder neck lesion, we will also re inspect right distal ureter in the OR.  Plan for URS/ RTG/ possible biopsy- no the right distal ureter was somewhat dilated but no suspicious lesions.   3. History of nephrolithiasis Currently stone free.               4. Nocturia/ history of retention/ BPH with sensation of incomplete bladder emptying Continue Flomax CIC as needed PVR minimal previously  Return in about 1 week (around 05/06/2016) for # 2 BCG.  These notes generated with voice recognition software. I apologize for typographical errors.  Zara Council, [facility] [address] [place],  [address] [phone]

## 2016-05-06 ENCOUNTER — Encounter: Payer: Self-pay | Admitting: Urology

## 2016-05-06 ENCOUNTER — Ambulatory Visit: Payer: PPO | Admitting: Urology

## 2016-05-06 VITALS — BP 153/87 | HR 66 | Ht 71.75 in | Wt 194.7 lb

## 2016-05-06 DIAGNOSIS — C679 Malignant neoplasm of bladder, unspecified: Secondary | ICD-10-CM

## 2016-05-06 DIAGNOSIS — R351 Nocturia: Secondary | ICD-10-CM | POA: Diagnosis not present

## 2016-05-06 DIAGNOSIS — Z87442 Personal history of urinary calculi: Secondary | ICD-10-CM | POA: Diagnosis not present

## 2016-05-06 DIAGNOSIS — C661 Malignant neoplasm of right ureter: Secondary | ICD-10-CM | POA: Diagnosis not present

## 2016-05-06 LAB — URINALYSIS, COMPLETE
BILIRUBIN UA: NEGATIVE
Glucose, UA: NEGATIVE
KETONES UA: NEGATIVE
LEUKOCYTES UA: NEGATIVE
Nitrite, UA: NEGATIVE
Protein, UA: NEGATIVE
RBC UA: NEGATIVE
SPEC GRAV UA: 1.015 (ref 1.005–1.030)
UUROB: 0.2 mg/dL (ref 0.2–1.0)
pH, UA: 7 (ref 5.0–7.5)

## 2016-05-06 LAB — MICROSCOPIC EXAMINATION
Bacteria, UA: NONE SEEN
EPITHELIAL CELLS (NON RENAL): NONE SEEN /HPF (ref 0–10)

## 2016-05-06 MED ORDER — LIDOCAINE HCL 2 % EX GEL
1.0000 "application " | Freq: Once | CUTANEOUS | Status: AC
  Administered 2016-05-06: 1 via URETHRAL

## 2016-05-06 MED ORDER — BCG LIVE 50 MG IS SUSR
3.2400 mL | Freq: Once | INTRAVESICAL | Status: AC
  Administered 2016-05-06: 81 mg via INTRAVESICAL

## 2016-05-06 NOTE — Progress Notes (Signed)
05/06/2016 11:20 AM   Anthony Gonzales [birth year] [id number]  Referring provider: Idelle Crouch, MD [facility] [place], [place] [address]  Chief Complaint  Patient presents with  . Bladder Cancer    BCG  #2    HPI: 83 yo WM who presents today for an induction series of BCG, this will be #2/6.    Patient initially presented with asymptomatic incidental moderate right hydroureteronephrosis in 12/2014.  He is taken to the OR on 01/30/2015 at which time right ureteroscopy revealed as suspicious area within the right distal ureter with a shaggy, irregular appearance but no obvious papillary or pedunculated tumor. Brush cytology was negative for any malignancy.   Ureteral biopsy revealed some atypia favoring neoplasm but essentially was non-diagnostic.  Interval follow-up renal ultrasound on 05/02/2015 did show complete resolution of his hydronephrosis.  He was counseled to return for a second look but deferred this for ~6 months.  He eventually return to the OR on 08/20/15 at which time the right distal ureteral was highly suspicious for papillary urothelial carcinoma was biopsied. 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  He returned to operating room on 09/12/2015 for a second look ureteroscopy/biopsy and TURBT. Given the depth of resection, mitomycin was not administered.  Pathology showed no residual tumor in the bladder or the distal ureter. A Bard Optima stent was replaced at the time of the procedure.  He completed an induction course of BCG completed on 11/30/2015 6 doses. This was performed with the stent in place.  The stent was subsequently removed.  Most recent renal ultrasound on 01/30/2016 shows no evidence of hydronephrosis bilaterally.  Surveillance cystoscopy performed on 02/06/2016 noted possible  recurrence at bladder neck- recommend TURBT with mitomycin.  He was taken to the OR on 02/26/2016 for TURBT, bilateral RTG's and right ureteroscopy.  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  No muscle in the specimen.  Recommend return to OR for re-TUR with mitomycin per AUA/ NCCN guidelines due to risk up upstaging.  Re-TUR occurred on 03/24/2016.  Pathology noted no evidence of residual tumor.     Never smoker.   He does have baseline chronic renal insufficiency.  Cr 1.6-2.   He also has a history of recurrent nephrolithiasis although no stone seen on CT scan as of 12/2014.  He is history of BPH and postop urinary retention, occasionally CIC's as needed.  Today, he presents today for his second installation of BCG.   He is complaining of frequency, nocturia, intermittency and a weak urinary stream.  He stated that he had intense frequency the next two days after his instillation.   He has not had a new cough.  He denies dysuria, gross hematuria and suprapubic pain.  He denies fevers, chills, nausea or vomiting.  His UA today is unremarkable.     PMH: Past Medical History:  Diagnosis Date  . Arthritis   . Benign fibroma of prostate 08/23/2013  . BPH (benign prostatic hyperplasia)   . Calculus of kidney 08/23/2013  . Cancer [facility])    bladder cancer and skin cancer  . Glaucoma    no drops in 3 mo pressure good, pt denies glaucoma, eye pressure has been measuring alright.  . History of kidney stones   . HLD (hyperlipidemia)   .  HOH (hard of hearing)    Left Hearing Aid  . HTN (hypertension) 12/26/2014  . Hypertension   . Hyponatremia 12/26/2014  . Migraines    history of migraines when he was younger.  [name] Restless leg syndrome   . Sinus drainage   . UTI (lower urinary tract infection) 12/26/2014  . Vertigo     Surgical History: Past Surgical History:  Procedure Laterality Date  . COLONOSCOPY    . CYSTOSCOPY W/  RETROGRADES Right 01/30/2015   Procedure: CYSTOSCOPY WITH RETROGRADE PYELOGRAM;  Surgeon: [name], MD;  Location: [facility] OR[facility];  Service: Urology;  Laterality: Right;  . CYSTOSCOPY W/ RETROGRADES Bilateral 02/26/2016   Procedure: CYSTOSCOPY WITH RETROGRADE PYELOGRAM;  Surgeon: [name], MD;  Location: [facility];  Service: Urology;  Laterality: Bilateral;  . CYSTOSCOPY W/ URETERAL STENT PLACEMENT Right 08/20/2015   Procedure: CYSTOSCOPY WITH RETROGRADE PYELOGRAM/POSSIBLE URETERAL STENT PLACEMENT/BLADDER BIOPSY;  Surgeon: [name], MD;  Location: [facility];  Service: Urology;  Laterality: Right;  . CYSTOSCOPY W/ URETERAL STENT PLACEMENT Right 09/12/2015   Procedure: CYSTOSCOPY WITH STENT REPLACEMENT;  Surgeon: [name], MD;  Location: [facility];  Service: Urology;  Laterality: Right;  . CYSTOSCOPY WITH BIOPSY Right 09/12/2015   Procedure: CYSTOSCOPY WITH BLADDER AND URETERAL BIOPSY;  Surgeon: [name], MD;  Location: [facility];  Service: Urology;  Laterality: Right;  . CYSTOSCOPY WITH STENT PLACEMENT Right 01/30/2015   Procedure: CYSTOSCOPY WITH STENT PLACEMENT;  Surgeon: [name], MD;  Location: [facility];  Service: Urology;  Laterality: Right;  . EYE SURGERY Bilateral    Cataract Extraction with IOL  . HOLMIUM LASER APPLICATION N/A 09/08/7791   Procedure:  HOLMIUM LASER APPLICATION;  Surgeon: [name], MD;  Location: [facility];  Service: Urology;  Laterality: N/A;  . SPERMATOCELECTOMY    . TONSILLECTOMY    . TRANSURETHRAL RESECTION OF BLADDER TUMOR N/A 02/26/2016   Procedure: TRANSURETHRAL RESECTION OF BLADDER TUMOR (TURBT);  Surgeon: [name], MD;  Location: [facility];  Service: Urology;  Laterality: N/A;  . TRANSURETHRAL RESECTION OF BLADDER TUMOR WITH MITOMYCIN-C N/A 09/12/2015   Procedure: TRANSURETHRAL RESECTION OF BLADDER TUMOR ;  Surgeon: [name], MD;  Location: [facility];  Service: Urology;  Laterality: N/A;  . TRANSURETHRAL RESECTION OF BLADDER TUMOR  WITH MITOMYCIN-C N/A 03/24/2016   Procedure: TRANSURETHRAL RESECTION OF BLADDER TUMOR WITH MITOMYCIN-C  (SMALL);  Surgeon: [name], MD;  Location: [facility];  Service: Urology;  Laterality: N/A;  . URETEROSCOPY Right 01/30/2015   Procedure: URETEROSCOPY/ WITH BIOPSY AND CYTOLOGY BRUSHING;  Surgeon: [name], MD;  Location: [facility];  Service: Urology;  Laterality: Right;  . URETEROSCOPY Right 08/20/2015   Procedure: URETEROSCOPY;  Surgeon: [name], MD;  Location: [facility];  Service: Urology;  Laterality: Right;  . URETEROSCOPY Right 09/12/2015   Procedure: URETEROSCOPY;  Surgeon: [name], MD;  Location: [facility];  Service: Urology;  Laterality: Right;  . URETEROSCOPY Right 02/26/2016   Procedure: URETEROSCOPY;  Surgeon: [name], MD;  Location: [facility];  Service: Urology;  Laterality: Right;    Home Medications:  Allergies as of 05/06/2016      Reactions   Demerol [meperidine] Nausea And Vomiting   Lipitor [atorvastatin] Swelling   Sulfa Antibiotics Nausea And Vomiting, Rash      Medication List       Accurate as of 05/06/16 11:20 AM. Always use your most recent med list.          acetaminophen 500 MG tablet Commonly known as:  TYLENOL Take 1,000 mg by mouth every 6 (six) hours as needed for moderate pain.   aspirin EC 81 MG tablet Take 81 mg by mouth daily.   benazepril-hydrochlorthiazide 20-12.5 MG tablet Commonly known as:  LOTENSIN HCT Take 1 tablet by mouth daily. In am.   docusate sodium 100 MG capsule Commonly known as:  COLACE Take 1 capsule (100 mg total) by mouth 2 (two) times daily.   fenofibrate micronized 134 MG capsule Commonly known as:  LOFIBRA Take 134 mg by mouth daily before breakfast.   Fish Oil 1200 MG Caps Take 1,200 mg by mouth 2 (two) times daily.   HYDROcodone-acetaminophen 5-325 MG tablet Commonly known as:  NORCO/VICODIN Take 1-2 tablets by mouth every 6 (six) hours as needed for moderate pain.   multivitamin  with minerals Tabs tablet Take 1 tablet by mouth daily.   psyllium 58.6 % packet Commonly known as:  METAMUCIL Take 1 packet by mouth daily.   tamsulosin 0.4 MG Caps capsule Commonly known as:  FLOMAX TAKE ONE CAPSULE BY MOUTH DAILY       Allergies:  Allergies  Allergen Reactions  . Demerol [Meperidine] Nausea And Vomiting  . Lipitor [Atorvastatin] Swelling  . Sulfa Antibiotics Nausea And Vomiting and Rash    Family History: Family History  Problem Relation Age of Onset  . Hypertension Mother   . Hypertension Father   . Prostate cancer Brother   . Kidney disease Neg Hx     Social History:  reports that he has never smoked. He has never used smokeless tobacco. He reports that he does not drink alcohol or use drugs.  ROS: UROLOGY Frequent Urination?: Yes Hard to postpone urination?: No Burning/pain with urination?: No Get up at night to urinate?: Yes Leakage of urine?: No Urine stream starts and stops?: No Trouble starting stream?: Yes Do you have to strain to urinate?: Yes Blood in urine?: No Urinary tract infection?: No Sexually transmitted disease?: No Injury to kidneys or bladder?: No Painful intercourse?: No Weak stream?: Yes Erection problems?: No Penile pain?: Yes  Gastrointestinal Nausea?: No Vomiting?: No Indigestion/heartburn?: No Diarrhea?: No Constipation?: No  Constitutional Fever: No Night sweats?: No Weight loss?: No Fatigue?: No  Skin Skin rash/lesions?: No Itching?: No  Eyes Blurred vision?: No Double vision?: No  Ears/Nose/Throat Sore throat?: No Sinus problems?: No  Hematologic/Lymphatic Swollen glands?: No Easy bruising?: No  Cardiovascular Leg swelling?: No Chest pain?: No  Respiratory Cough?: No Shortness of breath?: No  Endocrine Excessive thirst?: No  Musculoskeletal Back pain?: No Joint pain?: No  Neurological Headaches?: No Dizziness?: No  Psychologic Depression?: No Anxiety?: No  Physical  Exam: BP (!) 153/87   Pulse 66   Ht 5' 11.75" (1.822 m)   Wt 194 lb 11.2 oz (88.3 kg)   BMI 26.59 kg/m   Constitutional: Well nourished. Alert and oriented, No acute distress. HEENT: [place] AT, moist mucus membranes. Trachea midline, no masses. Cardiovascular: No clubbing, cyanosis, or edema. Respiratory: Normal respiratory effort, no increased work of breathing. GI: Abdomen is soft, non tender, non distended, no abdominal masses. Liver and spleen not palpable.  No hernias appreciated.  Stool sample for occult testing is not indicated.   GU: No CVA tenderness.  No bladder fullness or masses.   Skin: No rashes, bruises or suspicious lesions. Lymph: No cervical or inguinal adenopathy. Neurologic: Grossly intact, no focal deficits, moving all 4 extremities. Psychiatric: Normal mood and affect.  Laboratory Data: Lab Results  Component Value Date  WBC 6.5 03/17/2016   HGB 13.9 03/17/2016   HCT 41.2 03/17/2016   MCV 88.0 03/17/2016   PLT 260 03/17/2016    Lab Results  Component Value Date   CREATININE 1.34 (H) 03/17/2016    Lab Results  Component Value Date   AST 28 02/04/2015   Lab Results  Component Value Date   ALT 21 02/04/2015    Urinalysis Unremarkable.  See EPIC.     Assessment & Plan:    1. 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  No muscle in the specimen.  Recommend return to OR for re-TUR with mitomycin per AUA/ NCCN guidelines due to risk up upstaging.  Re-TUR occurred on 03/24/2016.  Pathology noted no evidence of residual tumor.    - Reviewed BCG treatment course, possible side effects including BCG sepsis, bladder irritation, worsening of her urinary symptoms  - # 2 of 6 BCG installed today  - Patient was instructed to pour  bleach down his toilet for the next 6 hours  -  Instructed to call the office if she should experience fevers greater than 102, chills/rigors, onset of a new cough, night sweats or further bladder spasms or inability to urinate   - RTC in one week for # 3 BCG  - Surveillance protocol also discussed today including cystoscopy every 3 months for at least 2 years and then spread out thereafter  - Urinalysis, Complete  - bcg vaccine injection 81 mg; Instill 3.24 mLs (81 mg total) into the bladder once.  - lidocaine (XYLOCAINE) 2 % jelly 1 application; Place 1 application into the urethra once.  2. Right distal ureteral cancer Previously no interested in distal ureterectomy/ nephrou S/p multiple biopsies/ laser ablation with stent/ BCG course In presence of bladder neck lesion, we will also re inspect right distal ureter in the OR.  Plan for URS/ RTG/ possible biopsy-  the right distal ureter was somewhat dilated but no suspicious lesions.   3. History of nephrolithiasis Currently stone free.               4. Nocturia/ history of retention/ BPH with sensation of incomplete bladder emptying Continue Flomax CIC as needed PVR minimal previously  Return in about 1 week (around 05/13/2016) for # 3 BCG.  These notes generated with voice recognition software. I apologize for typographical errors.  Zara Council, [facility] [address] [place], [place] [address] [phone]

## 2016-05-06 NOTE — Progress Notes (Signed)
BCG Bladder Instillation  BCG # 2  Due to Bladder Cancer patient is present today for a BCG treatment. Patient was cleaned and prepped in a sterile fashion with betadine and lidocaine 2% jelly was instilled into the urethra.  A 14FR catheter was inserted, urine return was noted 15m, urine was yellow in color.  553mof reconstituted BCG was instilled into the bladder. The catheter was then removed. Patient tolerated well, no complications were noted.  Preformed by: ShZara CouncilA-C and RaLyndee HensenMA  Follow up/ Additional notes: One Week

## 2016-05-13 NOTE — Progress Notes (Signed)
05/14/2016 11:25 AM   Anthony Gonzales [birth year] [id number]  Referring provider: Idelle Crouch, MD [facility] [place], [place] [address]  Chief Complaint  Patient presents with  . Other    bcg    HPI: 83 yo WM who presents today for an induction series of BCG, this will be #3/6.    Patient initially presented with asymptomatic incidental moderate right hydroureteronephrosis in 12/2014.  He is taken to the OR on 01/30/2015 at which time right ureteroscopy revealed as suspicious area within the right distal ureter with a shaggy, irregular appearance but no obvious papillary or pedunculated tumor. Brush cytology was negative for any malignancy.   Ureteral biopsy revealed some atypia favoring neoplasm but essentially was non-diagnostic.  Interval follow-up renal ultrasound on 05/02/2015 did show complete resolution of his hydronephrosis.  He was counseled to return for a second look but deferred this for ~6 months.  He eventually return to the OR on 08/20/15 at which time the right distal ureteral was highly suspicious for papillary urothelial carcinoma was biopsied. 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  He returned to operating room on 09/12/2015 for a second look ureteroscopy/biopsy and TURBT. Given the depth of resection, mitomycin was not administered.  Pathology showed no residual tumor in the bladder or the distal ureter. A Bard Optima stent was replaced at the time of the procedure.  He completed an induction course of BCG completed on 11/30/2015 6 doses. This was performed with the stent in place.  The stent was subsequently removed.  Most recent renal ultrasound on 01/30/2016 shows no evidence of hydronephrosis bilaterally.  Surveillance cystoscopy performed on 02/06/2016 noted possible recurrence at bladder  neck- recommend TURBT with mitomycin.  He was taken to the OR on 02/26/2016 for TURBT, bilateral RTG's and right ureteroscopy.  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  No muscle in the specimen.  Recommend return to OR for re-TUR with mitomycin per AUA/ NCCN guidelines due to risk up upstaging.  Re-TUR occurred on 03/24/2016.  Pathology noted no evidence of residual tumor.     Never smoker.   He does have baseline chronic renal insufficiency.  Cr 1.6-2.   He also has a history of recurrent nephrolithiasis although no stone seen on CT scan as of 12/2014.  He is history of BPH and postop urinary retention, occasionally CIC's as needed.  Today, he presents today for his third installation of BCG.   He is complaining of frequency, nocturia, intermittency and a weak urinary stream.  He stated that he had intense frequency the next two days after his instillation.   He was also having low grade fevers.  He has not had a new cough.  He denies dysuria, gross hematuria and suprapubic pain.  He denies fevers, chills, nausea or vomiting.  His UA today is positive for 6-10 WBC's.     PMH: Past Medical History:  Diagnosis Date  . Arthritis   . Benign fibroma of prostate 08/23/2013  . BPH (benign prostatic hyperplasia)   . Calculus of kidney 08/23/2013  . Cancer [facility])    bladder cancer and skin cancer  . Glaucoma    no drops in 3 mo pressure good, pt denies glaucoma, eye pressure has been measuring alright.  . History of  kidney stones   . HLD (hyperlipidemia)   . HOH (hard of hearing)    Left Hearing Aid  . HTN (hypertension) 12/26/2014  . Hypertension   . Hyponatremia 12/26/2014  . Migraines    history of migraines when he was younger.  [name] Restless leg syndrome   . Sinus drainage   . UTI (lower urinary tract infection) 12/26/2014  . Vertigo     Surgical History: Past Surgical History:  Procedure Laterality Date  . COLONOSCOPY     . CYSTOSCOPY W/ RETROGRADES Right 01/30/2015   Procedure: CYSTOSCOPY WITH RETROGRADE PYELOGRAM;  Surgeon: [name], MD;  Location: [facility] ORS;  Service: Urology;  Laterality: Right;  . CYSTOSCOPY W/ RETROGRADES Bilateral 02/26/2016   Procedure: CYSTOSCOPY WITH RETROGRADE PYELOGRAM;  Surgeon: [name], MD;  Location: [facility];  Service: Urology;  Laterality: Bilateral;  . CYSTOSCOPY W/ URETERAL STENT PLACEMENT Right 08/20/2015   Procedure: CYSTOSCOPY WITH RETROGRADE PYELOGRAM/POSSIBLE URETERAL STENT PLACEMENT/BLADDER BIOPSY;  Surgeon: [name], MD;  Location: [facility] OR[facility];  Service: Urology;  Laterality: Right;  . CYSTOSCOPY W/ URETERAL STENT PLACEMENT Right 09/12/2015   Procedure: CYSTOSCOPY WITH STENT REPLACEMENT;  Surgeon: [name], MD;  Location: [facility];  Service: Urology;  Laterality: Right;  . CYSTOSCOPY WITH BIOPSY Right 09/12/2015   Procedure: CYSTOSCOPY WITH BLADDER AND URETERAL BIOPSY;  Surgeon: [name], MD;  Location: [facility];  Service: Urology;  Laterality: Right;  . CYSTOSCOPY WITH STENT PLACEMENT Right 01/30/2015   Procedure: CYSTOSCOPY WITH STENT PLACEMENT;  Surgeon: [name], MD;  Location: [facility];  Service: Urology;  Laterality: Right;  . EYE SURGERY Bilateral    Cataract Extraction with IOL  . HOLMIUM LASER APPLICATION N/A 10/25/5629   Procedure:  HOLMIUM LASER APPLICATION;  Surgeon: [name], MD;  Location: [facility];  Service: Urology;  Laterality: N/A;  . SPERMATOCELECTOMY    . TONSILLECTOMY    . TRANSURETHRAL RESECTION OF BLADDER TUMOR N/A 02/26/2016   Procedure: TRANSURETHRAL RESECTION OF BLADDER TUMOR (TURBT);  Surgeon: [name], MD;  Location: [facility];  Service: Urology;  Laterality: N/A;  . TRANSURETHRAL RESECTION OF BLADDER TUMOR WITH MITOMYCIN-C N/A 09/12/2015   Procedure: TRANSURETHRAL RESECTION OF BLADDER TUMOR ;  Surgeon: [name], MD;  Location: [facility];  Service: Urology;  Laterality: N/A;  . TRANSURETHRAL  RESECTION OF BLADDER TUMOR WITH MITOMYCIN-C N/A 03/24/2016   Procedure: TRANSURETHRAL RESECTION OF BLADDER TUMOR WITH MITOMYCIN-C  (SMALL);  Surgeon: [name], MD;  Location: [facility];  Service: Urology;  Laterality: N/A;  . URETEROSCOPY Right 01/30/2015   Procedure: URETEROSCOPY/ WITH BIOPSY AND CYTOLOGY BRUSHING;  Surgeon: [name], MD;  Location: [facility];  Service: Urology;  Laterality: Right;  . URETEROSCOPY Right 08/20/2015   Procedure: URETEROSCOPY;  Surgeon: [name], MD;  Location: [facility];  Service: Urology;  Laterality: Right;  . URETEROSCOPY Right 09/12/2015   Procedure: URETEROSCOPY;  Surgeon: [name], MD;  Location: [facility];  Service: Urology;  Laterality: Right;  . URETEROSCOPY Right 02/26/2016   Procedure: URETEROSCOPY;  Surgeon: [name], MD;  Location: [facility];  Service: Urology;  Laterality: Right;    Home Medications:  Allergies as of 05/14/2016      Reactions   Demerol [meperidine] Nausea And Vomiting   Lipitor [atorvastatin] Swelling   Sulfa Antibiotics Nausea And Vomiting, Rash      Medication List       Accurate as of 05/14/16 11:25 AM. Always use your most recent med list.  acetaminophen 500 MG tablet Commonly known as:  TYLENOL Take 1,000 mg by mouth every 6 (six) hours as needed for moderate pain.   aspirin EC 81 MG tablet Take 81 mg by mouth daily.   benazepril-hydrochlorthiazide 20-12.5 MG tablet Commonly known as:  LOTENSIN HCT Take 1 tablet by mouth daily. In am.   docusate sodium 100 MG capsule Commonly known as:  COLACE Take 1 capsule (100 mg total) by mouth 2 (two) times daily.   fenofibrate micronized 134 MG capsule Commonly known as:  LOFIBRA Take 134 mg by mouth daily before breakfast.   Fish Oil 1200 MG Caps Take 1,200 mg by mouth 2 (two) times daily.   HYDROcodone-acetaminophen 5-325 MG tablet Commonly known as:  NORCO/VICODIN Take 1-2 tablets by mouth every 6 (six) hours as needed for  moderate pain.   multivitamin with minerals Tabs tablet Take 1 tablet by mouth daily.   psyllium 58.6 % packet Commonly known as:  METAMUCIL Take 1 packet by mouth daily.   tamsulosin 0.4 MG Caps capsule Commonly known as:  FLOMAX TAKE ONE CAPSULE BY MOUTH DAILY       Allergies:  Allergies  Allergen Reactions  . Demerol [Meperidine] Nausea And Vomiting  . Lipitor [Atorvastatin] Swelling  . Sulfa Antibiotics Nausea And Vomiting and Rash    Family History: Family History  Problem Relation Age of Onset  . Hypertension Mother   . Hypertension Father   . Prostate cancer Brother   . Kidney disease Neg Hx     Social History:  reports that he has never smoked. He has never used smokeless tobacco. He reports that he does not drink alcohol or use drugs.  ROS: UROLOGY Frequent Urination?: Yes Hard to postpone urination?: Yes Burning/pain with urination?: Yes Get up at night to urinate?: Yes Leakage of urine?: No Urine stream starts and stops?: Yes Trouble starting stream?: Yes Do you have to strain to urinate?: Yes Blood in urine?: No Urinary tract infection?: No Sexually transmitted disease?: No Injury to kidneys or bladder?: No Painful intercourse?: No Weak stream?: Yes Erection problems?: No Penile pain?: Yes  Gastrointestinal Nausea?: No Vomiting?: No Indigestion/heartburn?: No Diarrhea?: No Constipation?: No  Constitutional Fever: No Night sweats?: No Weight loss?: No Fatigue?: No  Skin Skin rash/lesions?: No Itching?: Yes  Eyes Blurred vision?: No Double vision?: No.  Ears/Nose/Throat Sore throat?: No Sinus problems?: Yes  Hematologic/Lymphatic Swollen glands?: No Easy bruising?: No  Cardiovascular Leg swelling?: No Chest pain?: No  Respiratory Cough?: No Shortness of breath?: No  Endocrine Excessive thirst?: No  Musculoskeletal Back pain?: No Joint pain?: Yes  Neurological Headaches?: No Dizziness?:  No  Psychologic Depression?: No Anxiety?: No  Physical Exam: BP (!) 171/73   Pulse 76   Ht 6' (1.829 m)   Wt 199 lb 11.2 oz (90.6 kg)   BMI 27.08 kg/m   Constitutional: Well nourished. Alert and oriented, No acute distress. HEENT: [place] AT, moist mucus membranes. Trachea midline, no masses. Cardiovascular: No clubbing, cyanosis, or edema. Respiratory: Normal respiratory effort, no increased work of breathing. GI: Abdomen is soft, non tender, non distended, no abdominal masses. Liver and spleen not palpable.  No hernias appreciated.  Stool sample for occult testing is not indicated.   GU: No CVA tenderness.  No bladder fullness or masses.   Skin: No rashes, bruises or suspicious lesions. Lymph: No cervical or inguinal adenopathy. Neurologic: Grossly intact, no focal deficits, moving all 4 extremities. Psychiatric: Normal mood and affect.  Laboratory Data: Lab  Results  Component Value Date   WBC 6.5 03/17/2016   HGB 13.9 03/17/2016   HCT 41.2 03/17/2016   MCV 88.0 03/17/2016   PLT 260 03/17/2016    Lab Results  Component Value Date   CREATININE 1.34 (H) 03/17/2016    Lab Results  Component Value Date   AST 28 02/04/2015   Lab Results  Component Value Date   ALT 21 02/04/2015    Urinalysis 6-10 WBC's.  See EPIC.     Assessment & Plan:    1. 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  No muscle in the specimen.  Recommend return to OR for re-TUR with mitomycin per AUA/ NCCN guidelines due to risk up upstaging.  Re-TUR occurred on 03/24/2016.  Pathology noted no evidence of residual tumor.    - Reviewed BCG treatment course, possible side effects including BCG sepsis, bladder irritation, worsening of her urinary symptoms  - # 3 of 6 BCG  installed today  - Patient was instructed to pour bleach down his toilet for the next 6 hours  -  Instructed to call the office if she should experience fevers greater than 102, chills/rigors, onset of a new cough, night sweats or further bladder spasms or inability to urinate   - RTC in one week for # 4 BCG  - Surveillance protocol also discussed today including cystoscopy every 3 months for at least 2 years and then spread out thereafter  - Urinalysis, Complete  - bcg vaccine injection 81 mg; Instill 3.24 mLs (81 mg total) into the bladder once.  - lidocaine (XYLOCAINE) 2 % jelly 1 application; Place 1 application into the urethra once.  2. Right distal ureteral cancer Previously no interested in distal ureterectomy/ nephrou S/p multiple biopsies/ laser ablation with stent/ BCG course In presence of bladder neck lesion, we will also re inspect right distal ureter in the OR.  Plan for URS/ RTG/ possible biopsy-  the right distal ureter was somewhat dilated but no suspicious lesions.   3. History of nephrolithiasis Currently stone free.               4. Nocturia/ history of retention/ BPH with sensation of incomplete bladder emptying Continue Flomax CIC as needed PVR minimal previously  Return for # 4 BCG.  These notes generated with voice recognition software. I apologize for typographical errors.  Zara Council, [facility] [address], [place], [place] [address] [phone]

## 2016-05-14 ENCOUNTER — Ambulatory Visit: Payer: PPO | Admitting: Urology

## 2016-05-14 ENCOUNTER — Encounter: Payer: Self-pay | Admitting: Urology

## 2016-05-14 VITALS — BP 171/73 | HR 76 | Ht 72.0 in | Wt 199.7 lb

## 2016-05-14 DIAGNOSIS — Z87442 Personal history of urinary calculi: Secondary | ICD-10-CM

## 2016-05-14 DIAGNOSIS — C679 Malignant neoplasm of bladder, unspecified: Secondary | ICD-10-CM | POA: Diagnosis not present

## 2016-05-14 DIAGNOSIS — C661 Malignant neoplasm of right ureter: Secondary | ICD-10-CM

## 2016-05-14 DIAGNOSIS — R351 Nocturia: Secondary | ICD-10-CM | POA: Diagnosis not present

## 2016-05-14 LAB — MICROSCOPIC EXAMINATION: BACTERIA UA: NONE SEEN

## 2016-05-14 LAB — URINALYSIS, COMPLETE
BILIRUBIN UA: NEGATIVE
Glucose, UA: NEGATIVE
KETONES UA: NEGATIVE
LEUKOCYTES UA: NEGATIVE
Nitrite, UA: NEGATIVE
Protein, UA: NEGATIVE
RBC UA: NEGATIVE
SPEC GRAV UA: 1.01 (ref 1.005–1.030)
Urobilinogen, Ur: 0.2 mg/dL (ref 0.2–1.0)
pH, UA: 7 (ref 5.0–7.5)

## 2016-05-14 MED ORDER — BCG LIVE 50 MG IS SUSR
3.2400 mL | Freq: Once | INTRAVESICAL | Status: AC
  Administered 2016-05-14: 81 mg via INTRAVESICAL

## 2016-05-19 NOTE — Progress Notes (Signed)
05/20/2016 11:40 AM   Anthony Gonzales May 17, 1931 [id number]  Referring provider: Idelle Crouch, MD [facility] [place], [place] [address]  Chief Complaint  Patient presents with  . Bladder Cancer    BCG #4    HPI: 83 yo WM who presents today for an induction series of BCG, this will be #4/6.    Patient initially presented with asymptomatic incidental moderate right hydroureteronephrosis in 12/2014.  He is taken to the OR on 01/30/2015 at which time right ureteroscopy revealed as suspicious area within the right distal ureter with a shaggy, irregular appearance but no obvious papillary or pedunculated tumor. Brush cytology was negative for any malignancy.   Ureteral biopsy revealed some atypia favoring neoplasm but essentially was non-diagnostic.  Interval follow-up renal ultrasound on 05/02/2015 did show complete resolution of his hydronephrosis.  He was counseled to return for a second look but deferred this for ~6 months.  He eventually return to the OR on 08/20/15 at which time the right distal ureteral was highly suspicious for papillary urothelial carcinoma was biopsied. 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  He returned to operating room on 09/12/2015 for a second look ureteroscopy/biopsy and TURBT. Given the depth of resection, mitomycin was not administered.  Pathology showed no residual tumor in the bladder or the distal ureter. A Bard Optima stent was replaced at the time of the procedure.  He completed an induction course of BCG completed on 11/30/2015 6 doses. This was performed with the stent in place.  The stent was subsequently removed.  Most recent renal ultrasound on 01/30/2016 shows no evidence of hydronephrosis bilaterally.  Surveillance cystoscopy performed on 02/06/2016 noted possible recurrence  at bladder neck- recommend TURBT with mitomycin.  He was taken to the OR on 02/26/2016 for TURBT, bilateral RTG's and right ureteroscopy.  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  No muscle in the specimen.  Recommend return to OR for re-TUR with mitomycin per AUA/ NCCN guidelines due to risk up upstaging.  Re-TUR occurred on 03/24/2016.  Pathology noted no evidence of residual tumor.     Never smoker.   He does have baseline chronic renal insufficiency.  Cr 1.6-2.   He also has a history of recurrent nephrolithiasis although no stone seen on CT scan as of 12/2014.  He is history of BPH and postop urinary retention, occasionally CIC's as needed.  Today, he presents today for his fourth installation of BCG.   He is complaining of frequency, nocturia, intermittency, straining to urinate and a weak urinary stream.  He stated that he had low grade fevers the next day after the BCG.  He was also having low grade fevers.  He has not had a new cough.  He denies dysuria, gross hematuria and suprapubic pain.  He denies fevers, chills, nausea or vomiting.  His UA today is positive for 11-30 WBC's.     PMH: Past Medical History:  Diagnosis Date  . Arthritis   . Benign fibroma of prostate 08/23/2013  . BPH (benign prostatic hyperplasia)   . Calculus of kidney 08/23/2013  . Cancer [facility])    bladder cancer and skin cancer  . Glaucoma    no drops in 3 mo pressure good, pt denies glaucoma, eye pressure has been measuring alright.  [name]  History of kidney stones   . HLD (hyperlipidemia)   . HOH (hard of hearing)    Left Hearing Aid  . HTN (hypertension) 12/26/2014  . Hypertension   . Hyponatremia 12/26/2014  . Migraines    history of migraines when he was younger.  [name] Restless leg syndrome   . Sinus drainage   . UTI (lower urinary tract infection) 12/26/2014  . Vertigo     Surgical History: Past Surgical History:  Procedure Laterality  Date  . COLONOSCOPY    . CYSTOSCOPY W/ RETROGRADES Right 01/30/2015   Procedure: CYSTOSCOPY WITH RETROGRADE PYELOGRAM;  Surgeon: [name], MD;  Location: [facility];  Service: Urology;  Laterality: Right;  . CYSTOSCOPY W/ RETROGRADES Bilateral 02/26/2016   Procedure: CYSTOSCOPY WITH RETROGRADE PYELOGRAM;  Surgeon: [name], MD;  Location: [facility];  Service: Urology;  Laterality: Bilateral;  . CYSTOSCOPY W/ URETERAL STENT PLACEMENT Right 08/20/2015   Procedure: CYSTOSCOPY WITH RETROGRADE PYELOGRAM/POSSIBLE URETERAL STENT PLACEMENT/BLADDER BIOPSY;  Surgeon: [name], MD;  Location: [facility];  Service: Urology;  Laterality: Right;  . CYSTOSCOPY W/ URETERAL STENT PLACEMENT Right 09/12/2015   Procedure: CYSTOSCOPY WITH STENT REPLACEMENT;  Surgeon: [name], MD;  Location: [facility];  Service: Urology;  Laterality: Right;  . CYSTOSCOPY WITH BIOPSY Right 09/12/2015   Procedure: CYSTOSCOPY WITH BLADDER AND URETERAL BIOPSY;  Surgeon: [name], MD;  Location: [facility];  Service: Urology;  Laterality: Right;  . CYSTOSCOPY WITH STENT PLACEMENT Right 01/30/2015   Procedure: CYSTOSCOPY WITH STENT PLACEMENT;  Surgeon: [name], MD;  Location: [facility];  Service: Urology;  Laterality: Right;  . EYE SURGERY Bilateral    Cataract Extraction with IOL  . HOLMIUM LASER APPLICATION N/A 05/29/252   Procedure:  HOLMIUM LASER APPLICATION;  Surgeon: [name], MD;  Location: [facility];  Service: Urology;  Laterality: N/A;  . SPERMATOCELECTOMY    . TONSILLECTOMY    . TRANSURETHRAL RESECTION OF BLADDER TUMOR N/A 02/26/2016   Procedure: TRANSURETHRAL RESECTION OF BLADDER TUMOR (TURBT);  Surgeon: [name], MD;  Location: [facility];  Service: Urology;  Laterality: N/A;  . TRANSURETHRAL RESECTION OF BLADDER TUMOR WITH MITOMYCIN-C N/A 09/12/2015   Procedure: TRANSURETHRAL RESECTION OF BLADDER TUMOR ;  Surgeon: [name], MD;  Location: [facility];  Service: Urology;  Laterality: N/A;  .  TRANSURETHRAL RESECTION OF BLADDER TUMOR WITH MITOMYCIN-C N/A 03/24/2016   Procedure: TRANSURETHRAL RESECTION OF BLADDER TUMOR WITH MITOMYCIN-C  (SMALL);  Surgeon: [name], MD;  Location: [facility];  Service: Urology;  Laterality: N/A;  . URETEROSCOPY Right 01/30/2015   Procedure: URETEROSCOPY/ WITH BIOPSY AND CYTOLOGY BRUSHING;  Surgeon: [name], MD;  Location: [facility];  Service: Urology;  Laterality: Right;  . URETEROSCOPY Right 08/20/2015   Procedure: URETEROSCOPY;  Surgeon: [name], MD;  Location: [facility];  Service: Urology;  Laterality: Right;  . URETEROSCOPY Right 09/12/2015   Procedure: URETEROSCOPY;  Surgeon: [name], MD;  Location: [facility];  Service: Urology;  Laterality: Right;  . URETEROSCOPY Right 02/26/2016   Procedure: URETEROSCOPY;  Surgeon: [name], MD;  Location: [facility];  Service: Urology;  Laterality: Right;    Home Medications:  Allergies as of 05/20/2016      Reactions   Demerol [meperidine] Nausea And Vomiting   Lipitor [atorvastatin] Swelling   Sulfa Antibiotics Nausea And Vomiting, Rash      Medication List       Accurate as of 05/20/16 11:40 AM. Always use your most recent med list.  acetaminophen 500 MG tablet Commonly known as:  TYLENOL Take 1,000 mg by mouth every 6 (six) hours as needed for moderate pain.   aspirin EC 81 MG tablet Take 81 mg by mouth daily.   benazepril-hydrochlorthiazide 20-12.5 MG tablet Commonly known as:  LOTENSIN HCT Take 1 tablet by mouth daily. In am.   docusate sodium 100 MG capsule Commonly known as:  COLACE Take 1 capsule (100 mg total) by mouth 2 (two) times daily.   fenofibrate micronized 134 MG capsule Commonly known as:  LOFIBRA Take 134 mg by mouth daily before breakfast.   Fish Oil 1200 MG Caps Take 1,200 mg by mouth 2 (two) times daily.   HYDROcodone-acetaminophen 5-325 MG tablet Commonly known as:  NORCO/VICODIN Take 1-2 tablets by mouth every 6 (six) hours as  needed for moderate pain.   multivitamin with minerals Tabs tablet Take 1 tablet by mouth daily.   psyllium 58.6 % packet Commonly known as:  METAMUCIL Take 1 packet by mouth daily.   tamsulosin 0.4 MG Caps capsule Commonly known as:  FLOMAX TAKE ONE CAPSULE BY MOUTH DAILY       Allergies:  Allergies  Allergen Reactions  . Demerol [Meperidine] Nausea And Vomiting  . Lipitor [Atorvastatin] Swelling  . Sulfa Antibiotics Nausea And Vomiting and Rash    Family History: Family History  Problem Relation Age of Onset  . Hypertension Mother   . Hypertension Father   . Prostate cancer Brother   . Kidney disease Neg Hx     Social History:  reports that he has never smoked. He has never used smokeless tobacco. He reports that he does not drink alcohol or use drugs.  ROS: UROLOGY Frequent Urination?: Yes Hard to postpone urination?: No Burning/pain with urination?: No Get up at night to urinate?: Yes Leakage of urine?: No Urine stream starts and stops?: No Trouble starting stream?: Yes Do you have to strain to urinate?: Yes Blood in urine?: No Urinary tract infection?: No Sexually transmitted disease?: No Injury to kidneys or bladder?: No Painful intercourse?: No Weak stream?: Yes Erection problems?: No Penile pain?: Yes  Gastrointestinal Nausea?: No Vomiting?: No Indigestion/heartburn?: No Diarrhea?: No Constipation?: No  Constitutional Fever: No Night sweats?: No Weight loss?: No Fatigue?: No  Skin Skin rash/lesions?: No Itching?: Yes  Eyes Blurred vision?: No Double vision?: No.  Ears/Nose/Throat Sore throat?: No Sinus problems?: Yes  Hematologic/Lymphatic Swollen glands?: No Easy bruising?: No  Cardiovascular Leg swelling?: No Chest pain?: No  Respiratory Cough?: Yes Shortness of breath?: No  Endocrine Excessive thirst?: No  Musculoskeletal Back pain?: No Joint pain?: No  Neurological Headaches?: No Dizziness?:  No  Psychologic Depression?: No Anxiety?: No  Physical Exam: BP (!) 149/71   Pulse 89   Ht 6' (1.829 m)   Wt 195 lb (88.5 kg)   BMI 26.45 kg/m   Constitutional: Well nourished. Alert and oriented, No acute distress. HEENT: [place] AT, moist mucus membranes. Trachea midline, no masses. Cardiovascular: No clubbing, cyanosis, or edema. Respiratory: Normal respiratory effort, no increased work of breathing. GI: Abdomen is soft, non tender, non distended, no abdominal masses. Liver and spleen not palpable.  No hernias appreciated.  Stool sample for occult testing is not indicated.   GU: No CVA tenderness.  No bladder fullness or masses.   Skin: No rashes, bruises or suspicious lesions. Lymph: No cervical or inguinal adenopathy. Neurologic: Grossly intact, no focal deficits, moving all 4 extremities. Psychiatric: Normal mood and affect.  Laboratory Data: Lab Results  Component Value Date   WBC 6.5 03/17/2016   HGB 13.9 03/17/2016   HCT 41.2 03/17/2016   MCV 88.0 03/17/2016   PLT 260 03/17/2016    Lab Results  Component Value Date   CREATININE 1.34 (H) 03/17/2016    Lab Results  Component Value Date   AST 28 02/04/2015   Lab Results  Component Value Date   ALT 21 02/04/2015    Urinalysis 11-30 WBC's.  See EPIC.     Assessment & Plan:    1. 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  No muscle in the specimen.  Recommend return to OR for re-TUR with mitomycin per AUA/ NCCN guidelines due to risk up upstaging.  Re-TUR occurred on 03/24/2016.  Pathology noted no evidence of residual tumor.    - Reviewed BCG treatment course, possible side effects including BCG sepsis, bladder irritation, worsening of her urinary symptoms  - # 4 of 6 BCG  installed today  - Patient was instructed to pour bleach down his toilet for the next 6 hours  -  Instructed to call the office if she should experience fevers greater than 102, chills/rigors, onset of a new cough, night sweats or further bladder spasms or inability to urinate   - RTC in one week for # 5 BCG  - Surveillance protocol also discussed today including cystoscopy every 3 months for at least 2 years and then spread out thereafter  - Urinalysis, Complete  - bcg vaccine injection 81 mg; Instill 3.24 mLs (81 mg total) into the bladder once.  - lidocaine (XYLOCAINE) 2 % jelly 1 application; Place 1 application into the urethra once.  2. Right distal ureteral cancer Previously no interested in distal ureterectomy/ nephrou S/p multiple biopsies/ laser ablation with stent/ BCG course In presence of bladder neck lesion, we will also re inspect right distal ureter in the OR.  Plan for URS/ RTG/ possible biopsy-  the right distal ureter was somewhat dilated but no suspicious lesions.   3. History of nephrolithiasis Currently stone free.               4. Nocturia/ history of retention/ BPH with sensation of incomplete bladder emptying Continue Flomax CIC as needed PVR minimal previously  Return in about 1 week (around 05/27/2016) for # 5 BCG.  These notes generated with voice recognition software. I apologize for typographical errors.  Zara Council, [facility] [address], [place], [place] [address] [phone]

## 2016-05-20 ENCOUNTER — Ambulatory Visit: Payer: PPO | Admitting: Urology

## 2016-05-20 ENCOUNTER — Encounter: Payer: Self-pay | Admitting: Urology

## 2016-05-20 VITALS — BP 149/71 | HR 89 | Ht 72.0 in | Wt 195.0 lb

## 2016-05-20 DIAGNOSIS — R339 Retention of urine, unspecified: Secondary | ICD-10-CM

## 2016-05-20 DIAGNOSIS — C679 Malignant neoplasm of bladder, unspecified: Secondary | ICD-10-CM

## 2016-05-20 DIAGNOSIS — C661 Malignant neoplasm of right ureter: Secondary | ICD-10-CM | POA: Diagnosis not present

## 2016-05-20 LAB — URINALYSIS, COMPLETE
BILIRUBIN UA: NEGATIVE
Glucose, UA: NEGATIVE
KETONES UA: NEGATIVE
Nitrite, UA: NEGATIVE
PROTEIN UA: NEGATIVE
SPEC GRAV UA: 1.015 (ref 1.005–1.030)
UUROB: 0.2 mg/dL (ref 0.2–1.0)
pH, UA: 7.5 (ref 5.0–7.5)

## 2016-05-20 LAB — MICROSCOPIC EXAMINATION
EPITHELIAL CELLS (NON RENAL): NONE SEEN /HPF (ref 0–10)
RBC, UA: NONE SEEN /hpf (ref 0–?)

## 2016-05-20 MED ORDER — BCG LIVE 50 MG IS SUSR
3.2400 mL | Freq: Once | INTRAVESICAL | Status: AC
  Administered 2016-05-20: 81 mg via INTRAVESICAL

## 2016-05-20 NOTE — Progress Notes (Signed)
BCG Bladder Instillation  BCG # 4  Due to Bladder Cancer patient is present today for a BCG treatment. Patient was cleaned and prepped in a sterile fashion with betadine and lidocaine 2% jelly was instilled into the urethra.  A 16FR catheter was inserted, urine return was noted 170m, urine was yellow in color.  546mof reconstituted BCG was instilled into the bladder. The catheter was then removed. Patient tolerated well, no complications were noted  Preformed by: ShZara CouncilPAC &                  SaAlva GarnetFollow up/ Additional notes: follow up in 1week for nect BCG

## 2016-05-27 ENCOUNTER — Encounter: Payer: Self-pay | Admitting: Urology

## 2016-05-27 ENCOUNTER — Ambulatory Visit: Payer: PPO | Admitting: Urology

## 2016-05-27 VITALS — BP 152/80 | HR 77 | Ht 72.0 in | Wt 194.5 lb

## 2016-05-27 DIAGNOSIS — C679 Malignant neoplasm of bladder, unspecified: Secondary | ICD-10-CM | POA: Diagnosis not present

## 2016-05-27 LAB — MICROSCOPIC EXAMINATION: Epithelial Cells (non renal): NONE SEEN /hpf (ref 0–10)

## 2016-05-27 LAB — URINALYSIS, COMPLETE
BILIRUBIN UA: NEGATIVE
GLUCOSE, UA: NEGATIVE
Ketones, UA: NEGATIVE
NITRITE UA: NEGATIVE
PH UA: 7 (ref 5.0–7.5)
Protein, UA: NEGATIVE
RBC UA: NEGATIVE
Specific Gravity, UA: 1.01 (ref 1.005–1.030)
UUROB: 0.2 mg/dL (ref 0.2–1.0)

## 2016-05-27 MED ORDER — BCG LIVE 50 MG IS SUSR
3.2400 mL | Freq: Once | INTRAVESICAL | Status: AC
  Administered 2016-05-27: 81 mg via INTRAVESICAL

## 2016-05-27 NOTE — Progress Notes (Signed)
05/27/2016 11:06 AM   Anthony Gonzales 1931-07-15 [id number]  Referring provider: Idelle Crouch, MD [facility] [place], [place] [address]  Chief Complaint  Patient presents with  . Bladder Cancer    HPI: 83 yo WM who presents today for an induction series of BCG, this will be #5/6.    Patient initially presented with asymptomatic incidental moderate right hydroureteronephrosis in 12/2014.  He is taken to the OR on 01/30/2015 at which time right ureteroscopy revealed as suspicious area within the right distal ureter with a shaggy, irregular appearance but no obvious papillary or pedunculated tumor. Brush cytology was negative for any malignancy.   Ureteral biopsy revealed some atypia favoring neoplasm but essentially was non-diagnostic.  Interval follow-up renal ultrasound on 05/02/2015 did show complete resolution of his hydronephrosis.  He was counseled to return for a second look but deferred this for ~6 months.  He eventually return to the OR on 08/20/15 at which time the right distal ureteral was highly suspicious for papillary urothelial carcinoma was biopsied. 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  He returned to operating room on 09/12/2015 for a second look ureteroscopy/biopsy and TURBT. Given the depth of resection, mitomycin was not administered.  Pathology showed no residual tumor in the bladder or the distal ureter. A Bard Optima stent was replaced at the time of the procedure.  He completed an induction course of BCG completed on 11/30/2015 6 doses. This was performed with the stent in place.  The stent was subsequently removed.  Most recent renal ultrasound on 01/30/2016 shows no evidence of hydronephrosis bilaterally.  Surveillance cystoscopy performed on 02/06/2016 noted possible recurrence at bladder  neck- recommend TURBT with mitomycin.  He was taken to the OR on 02/26/2016 for TURBT, bilateral RTG's and right ureteroscopy.  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  No muscle in the specimen.  Recommend return to OR for re-TUR with mitomycin per AUA/ NCCN guidelines due to risk up upstaging.  Re-TUR occurred on 03/24/2016.  Pathology noted no evidence of residual tumor.     Never smoker.   He does have baseline chronic renal insufficiency.  Cr 1.6-2.   He also has a history of recurrent nephrolithiasis although no stone seen on CT scan as of 12/2014.  He is history of BPH and postop urinary retention, occasionally CIC's as needed.  Today, he presents today for his fifth installation of BCG.   He is complaining of frequency, nocturia, hesitancy and a weak urinary stream.  He stated that he had low grade fevers the next day after the BCG.   He has not had a new cough.  He denies dysuria, gross hematuria and suprapubic pain.  He denies fevers, chills, nausea or vomiting at this time.  His UA today is positive for 6-10  WBC's.     PMH: Past Medical History:  Diagnosis Date  . Arthritis   . Benign fibroma of prostate 08/23/2013  . BPH (benign prostatic hyperplasia)   . Calculus of kidney 08/23/2013  . Cancer [facility])    bladder cancer and skin cancer  . Glaucoma    no drops in 3 mo pressure good, pt denies glaucoma, eye pressure has been measuring alright.  . History of kidney stones   . HLD (hyperlipidemia)   .  HOH (hard of hearing)    Left Hearing Aid  . HTN (hypertension) 12/26/2014  . Hypertension   . Hyponatremia 12/26/2014  . Migraines    history of migraines when he was younger.  [name] Restless leg syndrome   . Sinus drainage   . UTI (lower urinary tract infection) 12/26/2014  . Vertigo     Surgical History: Past Surgical History:  Procedure Laterality Date  . COLONOSCOPY    . CYSTOSCOPY W/ RETROGRADES Right  01/30/2015   Procedure: CYSTOSCOPY WITH RETROGRADE PYELOGRAM;  Surgeon: [name], MD;  Location: [facility];  Service: Urology;  Laterality: Right;  . CYSTOSCOPY W/ RETROGRADES Bilateral 02/26/2016   Procedure: CYSTOSCOPY WITH RETROGRADE PYELOGRAM;  Surgeon: [name], MD;  Location: [facility];  Service: Urology;  Laterality: Bilateral;  . CYSTOSCOPY W/ URETERAL STENT PLACEMENT Right 08/20/2015   Procedure: CYSTOSCOPY WITH RETROGRADE PYELOGRAM/POSSIBLE URETERAL STENT PLACEMENT/BLADDER BIOPSY;  Surgeon: [name], MD;  Location: [facility];  Service: Urology;  Laterality: Right;  . CYSTOSCOPY W/ URETERAL STENT PLACEMENT Right 09/12/2015   Procedure: CYSTOSCOPY WITH STENT REPLACEMENT;  Surgeon: [name], MD;  Location: [facility];  Service: Urology;  Laterality: Right;  . CYSTOSCOPY WITH BIOPSY Right 09/12/2015   Procedure: CYSTOSCOPY WITH BLADDER AND URETERAL BIOPSY;  Surgeon: [name], MD;  Location: [facility];  Service: Urology;  Laterality: Right;  . CYSTOSCOPY WITH STENT PLACEMENT Right 01/30/2015   Procedure: CYSTOSCOPY WITH STENT PLACEMENT;  Surgeon: [name], MD;  Location: [facility];  Service: Urology;  Laterality: Right;  . EYE SURGERY Bilateral    Cataract Extraction with IOL  . HOLMIUM LASER APPLICATION N/A 4/68/0321   Procedure:  HOLMIUM LASER APPLICATION;  Surgeon: [name], MD;  Location: [facility];  Service: Urology;  Laterality: N/A;  . SPERMATOCELECTOMY    . TONSILLECTOMY    . TRANSURETHRAL RESECTION OF BLADDER TUMOR N/A 02/26/2016   Procedure: TRANSURETHRAL RESECTION OF BLADDER TUMOR (TURBT);  Surgeon: [name], MD;  Location: [facility];  Service: Urology;  Laterality: N/A;  . TRANSURETHRAL RESECTION OF BLADDER TUMOR WITH MITOMYCIN-C N/A 09/12/2015   Procedure: TRANSURETHRAL RESECTION OF BLADDER TUMOR ;  Surgeon: [name], MD;  Location: [facility];  Service: Urology;  Laterality: N/A;  . TRANSURETHRAL RESECTION OF BLADDER TUMOR WITH MITOMYCIN-C  N/A 03/24/2016   Procedure: TRANSURETHRAL RESECTION OF BLADDER TUMOR WITH MITOMYCIN-C  (SMALL);  Surgeon: [name], MD;  Location: [facility];  Service: Urology;  Laterality: N/A;  . URETEROSCOPY Right 01/30/2015   Procedure: URETEROSCOPY/ WITH BIOPSY AND CYTOLOGY BRUSHING;  Surgeon: [name], MD;  Location: [facility];  Service: Urology;  Laterality: Right;  . URETEROSCOPY Right 08/20/2015   Procedure: URETEROSCOPY;  Surgeon: [name], MD;  Location: [facility];  Service: Urology;  Laterality: Right;  . URETEROSCOPY Right 09/12/2015   Procedure: URETEROSCOPY;  Surgeon: [name], MD;  Location: [facility];  Service: Urology;  Laterality: Right;  . URETEROSCOPY Right 02/26/2016   Procedure: URETEROSCOPY;  Surgeon: [name], MD;  Location: [facility];  Service: Urology;  Laterality: Right;    Home Medications:  Allergies as of 05/27/2016      Reactions   Demerol [meperidine] Nausea And Vomiting   Lipitor [atorvastatin] Swelling   Sulfa Antibiotics Nausea And Vomiting, Rash      Medication List       Accurate as of 05/27/16 11:06 AM. Always use your most recent med list.          acetaminophen 500 MG tablet Commonly known as:  TYLENOL Take 1,000 mg by mouth every 6 (six) hours as needed for moderate pain.   aspirin EC 81 MG tablet Take 81 mg by mouth daily.   benazepril-hydrochlorthiazide 20-12.5 MG tablet Commonly known as:  LOTENSIN HCT Take 1 tablet by mouth daily. In am.   docusate sodium 100 MG capsule Commonly known as:  COLACE Take 1 capsule (100 mg total) by mouth 2 (two) times daily.   fenofibrate micronized 134 MG capsule Commonly known as:  LOFIBRA Take 134 mg by mouth daily before breakfast.   Fish Oil 1200 MG Caps Take 1,200 mg by mouth 2 (two) times daily.   HYDROcodone-acetaminophen 5-325 MG tablet Commonly known as:  NORCO/VICODIN Take 1-2 tablets by mouth every 6 (six) hours as needed for moderate pain.   multivitamin with minerals  Tabs tablet Take 1 tablet by mouth daily.   psyllium 58.6 % packet Commonly known as:  METAMUCIL Take 1 packet by mouth daily.   tamsulosin 0.4 MG Caps capsule Commonly known as:  FLOMAX TAKE ONE CAPSULE BY MOUTH DAILY       Allergies:  Allergies  Allergen Reactions  . Demerol [Meperidine] Nausea And Vomiting  . Lipitor [Atorvastatin] Swelling  . Sulfa Antibiotics Nausea And Vomiting and Rash    Family History: Family History  Problem Relation Age of Onset  . Hypertension Mother   . Hypertension Father   . Prostate cancer Brother   . Kidney disease Neg Hx     Social History:  reports that he has never smoked. He has never used smokeless tobacco. He reports that he does not drink alcohol or use drugs.  ROS: UROLOGY Frequent Urination?: Yes Hard to postpone urination?: No Burning/pain with urination?: No Get up at night to urinate?: Yes Leakage of urine?: No Urine stream starts and stops?: No Trouble starting stream?: Yes Do you have to strain to urinate?: No Blood in urine?: No Urinary tract infection?: No Sexually transmitted disease?: No Injury to kidneys or bladder?: No Painful intercourse?: No Weak stream?: Yes Erection problems?: No Penile pain?: Yes  Gastrointestinal Nausea?: No Vomiting?: No Indigestion/heartburn?: No Diarrhea?: No Constipation?: No  Constitutional Fever: No Night sweats?: No Weight loss?: No Fatigue?: No  Skin Skin rash/lesions?: No Itching?: No  Eyes Blurred vision?: No Double vision?: No.  Ears/Nose/Throat Sore throat?: No Sinus problems?: No  Hematologic/Lymphatic Swollen glands?: No Easy bruising?: No  Cardiovascular Leg swelling?: No Chest pain?: No  Respiratory Cough?: No Shortness of breath?: No  Endocrine Excessive thirst?: No  Musculoskeletal Back pain?: No Joint pain?: No  Neurological Headaches?: No Dizziness?: No  Psychologic Depression?: No Anxiety?: No  Physical Exam: BP (!)  152/80 (BP Location: Right Arm, Patient Position: Sitting, Cuff Size: Normal)   Pulse 77   Ht 6' (1.829 m)   Wt 194 lb 8 oz (88.2 kg)   BMI 26.38 kg/m   Constitutional: Well nourished. Alert and oriented, No acute distress. HEENT: [place] AT, moist mucus membranes. Trachea midline, no masses. Cardiovascular: No clubbing, cyanosis, or edema. Respiratory: Normal respiratory effort, no increased work of breathing. GI: Abdomen is soft, non tender, non distended, no abdominal masses. Liver and spleen not palpable.  No hernias appreciated.  Stool sample for occult testing is not indicated.   GU: No CVA tenderness.  No bladder fullness or masses.   Skin: No rashes, bruises or suspicious lesions. Lymph: No cervical or inguinal adenopathy. Neurologic: Grossly intact, no focal deficits, moving all 4 extremities. Psychiatric: Normal mood and affect.  Laboratory  Data: Lab Results  Component Value Date   WBC 6.5 03/17/2016   HGB 13.9 03/17/2016   HCT 41.2 03/17/2016   MCV 88.0 03/17/2016   PLT 260 03/17/2016    Lab Results  Component Value Date   CREATININE 1.34 (H) 03/17/2016    Lab Results  Component Value Date   AST 28 02/04/2015   Lab Results  Component Value Date   ALT 21 02/04/2015    Urinalysis 6-10 WBC's.  See EPIC.     Assessment & Plan:    1. 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  No muscle in the specimen.  Recommend return to OR for re-TUR with mitomycin per AUA/ NCCN guidelines due to risk up upstaging.  Re-TUR occurred on 03/24/2016.  Pathology noted no evidence of residual tumor.    - Reviewed BCG treatment course, possible side effects including BCG sepsis, bladder irritation, worsening of her urinary symptoms  - # 5 of 6 BCG  installed today  - Patient was instructed to pour bleach down his toilet for the next 6 hours  -  Instructed to call the office if she should experience fevers greater than 102, chills/rigors, onset of a new cough, night sweats or further bladder spasms or inability to urinate   - RTC in one week for # 5 BCG  - Surveillance protocol also discussed today including cystoscopy every 3 months for at least 2 years and then spread out thereafter  - Urinalysis, Complete  - bcg vaccine injection 81 mg; Instill 3.24 mLs (81 mg total) into the bladder once.  - lidocaine (XYLOCAINE) 2 % jelly 1 application; Place 1 application into the urethra once.  2. Right distal ureteral cancer Previously no interested in distal ureterectomy/ nephrou S/p multiple biopsies/ laser ablation with stent/ BCG course In presence of bladder neck lesion, we will also re inspect right distal ureter in the OR.  Plan for URS/ RTG/ possible biopsy-  the right distal ureter was somewhat dilated but no suspicious lesions.   3. History of nephrolithiasis Currently stone free.               4. Nocturia/ history of retention/ BPH with sensation of incomplete bladder emptying Continue Flomax CIC as needed PVR minimal previously  Return in about 1 week (around 06/03/2016) for # 6 BCG.  These notes generated with voice recognition software. I apologize for typographical errors.  Zara Council, [facility] [address] [place], [place] [address] [phone]

## 2016-05-27 NOTE — Addendum Note (Signed)
Addended by: Kyra Manges on: 05/27/2016 11:29 AM   Modules accepted: Orders

## 2016-05-27 NOTE — Progress Notes (Signed)
BCG Bladder Instillation  BCG # 5  Due to Bladder Cancer patient is present today for a BCG treatment. Patient was cleaned and prepped in a sterile fashion with betadine and lidocaine 2% jelly was instilled into the urethra.  A 14FR catheter was inserted, urine return was noted 178m, urine was yellow in color.  575mof reconstituted BCG was instilled into the bladder. The catheter was then removed. Patient tolerated well, no complications were noted  Preformed by: ShZara CouncilPA-C, CaElberta LeatherwoodCMA  Follow up/ Additional notes: 1 week BCG 6

## 2016-06-02 NOTE — Progress Notes (Signed)
06/03/2016 11:20 AM   Anthony Gonzales March 21, 1931 [id number]  Referring provider: Idelle Crouch, MD [facility] [place], [place] [address]  Chief Complaint  Patient presents with  . Bladder Cancer    BCG  6     HPI: 83 yo WM who presents today for an induction series of BCG, this will be #6/6.    Patient initially presented with asymptomatic incidental moderate right hydroureteronephrosis in 12/2014.  He is taken to the OR on 01/30/2015 at which time right ureteroscopy revealed as suspicious area within the right distal ureter with a shaggy, irregular appearance but no obvious papillary or pedunculated tumor. Brush cytology was negative for any malignancy.   Ureteral biopsy revealed some atypia favoring neoplasm but essentially was non-diagnostic.  Interval follow-up renal ultrasound on 05/02/2015 did show complete resolution of his hydronephrosis.  He was counseled to return for a second look but deferred this for ~6 months.  He eventually return to the OR on 08/20/15 at which time the right distal ureteral was highly suspicious for papillary urothelial carcinoma was biopsied. 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  He returned to operating room on 09/12/2015 for a second look ureteroscopy/biopsy and TURBT. Given the depth of resection, mitomycin was not administered.  Pathology showed no residual tumor in the bladder or the distal ureter. A Bard Optima stent was replaced at the time of the procedure.  He completed an induction course of BCG completed on 11/30/2015 6 doses. This was performed with the stent in place.  The stent was subsequently removed.  Most recent renal ultrasound on 01/30/2016 shows no evidence of hydronephrosis bilaterally.  Surveillance cystoscopy performed on 02/06/2016 noted possible  recurrence at bladder neck- recommend TURBT with mitomycin.  He was taken to the OR on 02/26/2016 for TURBT, bilateral RTG's and right ureteroscopy.  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  No muscle in the specimen.  Recommend return to OR for re-TUR with mitomycin per AUA/ NCCN guidelines due to risk up upstaging.  Re-TUR occurred on 03/24/2016.  Pathology noted no evidence of residual tumor.    Never smoker.   He does have baseline chronic renal insufficiency.  Cr 1.6-2.   He also has a history of recurrent nephrolithiasis although no stone seen on CT scan as of 12/2014.  He is history of BPH and postop urinary retention, occasionally CIC's as needed.  Today, he presents today for his sixth installation of BCG.   He is complaining of frequency, urgency, nocturia, intermittency and a weak urinary stream.  He stated that he has no issues with his last BCG.   He has not had a new cough.  He denies dysuria, gross hematuria and suprapubic pain.  He denies fevers, chills, nausea or vomiting at this time.  He is currently on an antibiotic for a sinus infection, Ceftin.  His UA today is positive for 11-30 WBC's.     PMH: Past Medical History:  Diagnosis Date  . Arthritis   . Benign fibroma of prostate 08/23/2013  . BPH (benign prostatic hyperplasia)   . Calculus of kidney 08/23/2013  . Cancer [facility])    bladder cancer and skin cancer  . Glaucoma    no drops in 3 mo pressure good, pt denies glaucoma, eye pressure  has been measuring alright.  . History of kidney stones   . HLD (hyperlipidemia)   . HOH (hard of hearing)    Left Hearing Aid  . HTN (hypertension) 12/26/2014  . Hypertension   . Hyponatremia 12/26/2014  . Migraines    history of migraines when he was younger.  [name] Restless leg syndrome   . Sinus drainage   . UTI (lower urinary tract infection) 12/26/2014  . Vertigo     Surgical History: Past Surgical History:    Procedure Laterality Date  . COLONOSCOPY    . CYSTOSCOPY W/ RETROGRADES Right 01/30/2015   Procedure: CYSTOSCOPY WITH RETROGRADE PYELOGRAM;  Surgeon: [name], MD;  Location: [facility] ORS;  Service: Urology;  Laterality: Right;  . CYSTOSCOPY W/ RETROGRADES Bilateral 02/26/2016   Procedure: CYSTOSCOPY WITH RETROGRADE PYELOGRAM;  Surgeon: [name], MD;  Location: [facility];  Service: Urology;  Laterality: Bilateral;  . CYSTOSCOPY W/ URETERAL STENT PLACEMENT Right 08/20/2015   Procedure: CYSTOSCOPY WITH RETROGRADE PYELOGRAM/POSSIBLE URETERAL STENT PLACEMENT/BLADDER BIOPSY;  Surgeon: [name], MD;  Location: [facility];  Service: Urology;  Laterality: Right;  . CYSTOSCOPY W/ URETERAL STENT PLACEMENT Right 09/12/2015   Procedure: CYSTOSCOPY WITH STENT REPLACEMENT;  Surgeon: [name], MD;  Location: [facility];  Service: Urology;  Laterality: Right;  . CYSTOSCOPY WITH BIOPSY Right 09/12/2015   Procedure: CYSTOSCOPY WITH BLADDER AND URETERAL BIOPSY;  Surgeon: [name], MD;  Location: [facility];  Service: Urology;  Laterality: Right;  . CYSTOSCOPY WITH STENT PLACEMENT Right 01/30/2015   Procedure: CYSTOSCOPY WITH STENT PLACEMENT;  Surgeon: [name], MD;  Location: [facility];  Service: Urology;  Laterality: Right;  . EYE SURGERY Bilateral    Cataract Extraction with IOL  . HOLMIUM LASER APPLICATION N/A 0/34/7425   Procedure:  HOLMIUM LASER APPLICATION;  Surgeon: [name], MD;  Location: [facility];  Service: Urology;  Laterality: N/A;  . SPERMATOCELECTOMY    . TONSILLECTOMY    . TRANSURETHRAL RESECTION OF BLADDER TUMOR N/A 02/26/2016   Procedure: TRANSURETHRAL RESECTION OF BLADDER TUMOR (TURBT);  Surgeon: [name], MD;  Location: [facility];  Service: Urology;  Laterality: N/A;  . TRANSURETHRAL RESECTION OF BLADDER TUMOR WITH MITOMYCIN-C N/A 09/12/2015   Procedure: TRANSURETHRAL RESECTION OF BLADDER TUMOR ;  Surgeon: [name], MD;  Location: [facility];  Service:  Urology;  Laterality: N/A;  . TRANSURETHRAL RESECTION OF BLADDER TUMOR WITH MITOMYCIN-C N/A 03/24/2016   Procedure: TRANSURETHRAL RESECTION OF BLADDER TUMOR WITH MITOMYCIN-C  (SMALL);  Surgeon: [name], MD;  Location: [facility];  Service: Urology;  Laterality: N/A;  . URETEROSCOPY Right 01/30/2015   Procedure: URETEROSCOPY/ WITH BIOPSY AND CYTOLOGY BRUSHING;  Surgeon: [name], MD;  Location: [facility];  Service: Urology;  Laterality: Right;  . URETEROSCOPY Right 08/20/2015   Procedure: URETEROSCOPY;  Surgeon: [name], MD;  Location: [facility];  Service: Urology;  Laterality: Right;  . URETEROSCOPY Right 09/12/2015   Procedure: URETEROSCOPY;  Surgeon: [name], MD;  Location: [facility];  Service: Urology;  Laterality: Right;  . URETEROSCOPY Right 02/26/2016   Procedure: URETEROSCOPY;  Surgeon: [name], MD;  Location: [facility];  Service: Urology;  Laterality: Right;    Home Medications:  Allergies as of 06/03/2016      Reactions   Demerol [meperidine] Nausea And Vomiting   Lipitor [atorvastatin] Swelling   Sulfa Antibiotics Nausea And Vomiting, Rash      Medication List       Accurate as of 06/03/16 11:20 AM. Always use your most recent  med list.          acetaminophen 500 MG tablet Commonly known as:  TYLENOL Take 1,000 mg by mouth every 6 (six) hours as needed for moderate pain.   aspirin EC 81 MG tablet Take 81 mg by mouth daily.   benazepril-hydrochlorthiazide 20-12.5 MG tablet Commonly known as:  LOTENSIN HCT Take 1 tablet by mouth daily. In am.   benzonatate 200 MG capsule Commonly known as:  TESSALON Take by mouth.   cefUROXime 500 MG tablet Commonly known as:  CEFTIN Take by mouth.   docusate sodium 100 MG capsule Commonly known as:  COLACE Take 1 capsule (100 mg total) by mouth 2 (two) times daily.   fenofibrate micronized 134 MG capsule Commonly known as:  LOFIBRA Take 134 mg by mouth daily before breakfast.   Fish Oil 1200 MG  Caps Take 1,200 mg by mouth 2 (two) times daily.   HYDROcodone-acetaminophen 5-325 MG tablet Commonly known as:  NORCO/VICODIN Take 1-2 tablets by mouth every 6 (six) hours as needed for moderate pain.   multivitamin with minerals Tabs tablet Take 1 tablet by mouth daily.   psyllium 58.6 % packet Commonly known as:  METAMUCIL Take 1 packet by mouth daily.   tamsulosin 0.4 MG Caps capsule Commonly known as:  FLOMAX TAKE ONE CAPSULE BY MOUTH DAILY       Allergies:  Allergies  Allergen Reactions  . Demerol [Meperidine] Nausea And Vomiting  . Lipitor [Atorvastatin] Swelling  . Sulfa Antibiotics Nausea And Vomiting and Rash    Family History: Family History  Problem Relation Age of Onset  . Hypertension Mother   . Hypertension Father   . Prostate cancer Brother   . Kidney disease Neg Hx   . Kidney cancer Neg Hx   . Bladder Cancer Neg Hx     Social History:  reports that he has never smoked. He has never used smokeless tobacco. He reports that he does not drink alcohol or use drugs.  ROS: UROLOGY Frequent Urination?: Yes Hard to postpone urination?: Yes Burning/pain with urination?: No Get up at night to urinate?: Yes Leakage of urine?: No Urine stream starts and stops?: No Trouble starting stream?: Yes Do you have to strain to urinate?: No Blood in urine?: No Urinary tract infection?: No Sexually transmitted disease?: No Injury to kidneys or bladder?: No Painful intercourse?: No Weak stream?: Yes Erection problems?: No Penile pain?: No  Gastrointestinal Nausea?: No Vomiting?: No Indigestion/heartburn?: No Diarrhea?: No Constipation?: No  Constitutional Fever: No Night sweats?: No Weight loss?: No Fatigue?: No  Skin Skin rash/lesions?: No Itching?: No  Eyes Blurred vision?: No Double vision?: No.  Ears/Nose/Throat Sore throat?: No Sinus problems?: Yes  Hematologic/Lymphatic Swollen glands?: No Easy bruising?: No  Cardiovascular Leg  swelling?: No Chest pain?: No  Respiratory Cough?: Yes Shortness of breath?: No  Endocrine Excessive thirst?: No  Musculoskeletal Back pain?: No Joint pain?: No  Neurological Headaches?: Yes Dizziness?: No  Psychologic Depression?: No Anxiety?: No  Physical Exam: BP (!) 172/79   Pulse 84   Temp 97.4 F (36.3 C) (Oral)   Ht 5' 11.25" (1.81 m)   Wt 194 lb 4.8 oz (88.1 kg)   BMI 26.91 kg/m   Constitutional: Well nourished. Alert and oriented, No acute distress. HEENT: [place] AT, moist mucus membranes. Trachea midline, no masses. Cardiovascular: No clubbing, cyanosis, or edema. Respiratory: Normal respiratory effort, no increased work of breathing. GI: Abdomen is soft, non tender, non distended, no abdominal masses. Liver and  spleen not palpable.  No hernias appreciated.  Stool sample for occult testing is not indicated.   GU: No CVA tenderness.  No bladder fullness or masses.   Skin: No rashes, bruises or suspicious lesions. Lymph: No cervical or inguinal adenopathy. Neurologic: Grossly intact, no focal deficits, moving all 4 extremities. Psychiatric: Normal mood and affect.  Laboratory Data: Lab Results  Component Value Date   WBC 6.5 03/17/2016   HGB 13.9 03/17/2016   HCT 41.2 03/17/2016   MCV 88.0 03/17/2016   PLT 260 03/17/2016    Lab Results  Component Value Date   CREATININE 1.34 (H) 03/17/2016    Lab Results  Component Value Date   AST 28 02/04/2015   Lab Results  Component Value Date   ALT 21 02/04/2015    Urinalysis 6-10 WBC's.  See EPIC.     Assessment & Plan:    1. 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  No muscle in the specimen.  Recommend return to OR for re-TUR with mitomycin per AUA/ NCCN guidelines  due to risk up upstaging.  Re-TUR occurred on 03/24/2016.  Pathology noted no evidence of residual tumor.    - Reviewed BCG treatment course, possible side effects including BCG sepsis, bladder irritation, worsening of her urinary symptoms  - # 6 of 6 BCG installed today  - Patient was instructed to pour bleach down his toilet for the next 6 hours  -  Instructed to call the office if she should experience fevers greater than 102, chills/rigors, onset of a new cough, night sweats or further bladder spasms or inability to urinate   - RTC in three months for cystoscopy  - Surveillance protocol also discussed today including cystoscopy every 3 months for at least 2 years and then spread out thereafter  - Urinalysis, Complete  - bcg vaccine injection 81 mg; Instill 3.24 mLs (81 mg total) into the bladder once.  - lidocaine (XYLOCAINE) 2 % jelly 1 application; Place 1 application into the urethra once.  2. Right distal ureteral cancer Previously no interested in distal ureterectomy/ nephrou S/p multiple biopsies/ laser ablation with stent/ BCG course In presence of bladder neck lesion, we will also re inspect right distal ureter in the OR.  Plan for URS/ RTG/ possible biopsy-  the right distal ureter was somewhat dilated but no suspicious lesions during 03/2016 procedure  3. History of nephrolithiasis Currently stone free.               4. Nocturia/ history of retention/ BPH with sensation of incomplete bladder emptying Continue Flomax CIC as needed PVR minimal previously  Return in about 3 months (around 09/03/2016) for surveillance cystoscopy.  These notes generated with voice recognition software. I apologize for typographical errors.  Zara Council, [facility] [address] [place], [place] [address] [phone]

## 2016-06-03 ENCOUNTER — Encounter: Payer: Self-pay | Admitting: Urology

## 2016-06-03 ENCOUNTER — Ambulatory Visit: Payer: PPO | Admitting: Urology

## 2016-06-03 VITALS — BP 172/79 | HR 84 | Temp 97.4°F | Ht 71.25 in | Wt 194.3 lb

## 2016-06-03 DIAGNOSIS — C679 Malignant neoplasm of bladder, unspecified: Secondary | ICD-10-CM | POA: Diagnosis not present

## 2016-06-03 DIAGNOSIS — Z87898 Personal history of other specified conditions: Secondary | ICD-10-CM | POA: Diagnosis not present

## 2016-06-03 DIAGNOSIS — C661 Malignant neoplasm of right ureter: Secondary | ICD-10-CM

## 2016-06-03 LAB — URINALYSIS, COMPLETE
BILIRUBIN UA: NEGATIVE
Glucose, UA: NEGATIVE
Ketones, UA: NEGATIVE
NITRITE UA: NEGATIVE
PH UA: 7 (ref 5.0–7.5)
Protein, UA: NEGATIVE
Specific Gravity, UA: 1.015 (ref 1.005–1.030)
UUROB: 0.2 mg/dL (ref 0.2–1.0)

## 2016-06-03 LAB — MICROSCOPIC EXAMINATION: EPITHELIAL CELLS (NON RENAL): NONE SEEN /HPF (ref 0–10)

## 2016-06-03 MED ORDER — LIDOCAINE HCL 2 % EX GEL
1.0000 "application " | Freq: Once | CUTANEOUS | Status: AC
  Administered 2016-06-03: 1 via URETHRAL

## 2016-06-03 MED ORDER — BCG LIVE 50 MG IS SUSR
3.2400 mL | Freq: Once | INTRAVESICAL | Status: AC
  Administered 2016-06-03: 81 mg via INTRAVESICAL

## 2016-06-03 NOTE — Addendum Note (Signed)
Addended by: Orlene Erm on: 06/03/2016 12:13 PM   Modules accepted: Orders

## 2016-06-03 NOTE — Progress Notes (Signed)
BCG Bladder Instillation  BCG # 6  Due to Bladder Cancer patient is present today for a BCG treatment. Patient was cleaned and prepped in a sterile fashion with betadine and lidocaine 2% jelly was instilled into the urethra.  A 14FR catheter was inserted, urine return was noted 168m, urine was yellow in color.  574mof reconstituted BCG was instilled into the bladder. The catheter was then removed. Patient tolerated well, no complications were noted.  Preformed by: ShZara CouncilA-C and RaLyndee HensenMA  Follow up/ Additional notes: 3 months for cystoscopy

## 2016-06-18 DIAGNOSIS — N400 Enlarged prostate without lower urinary tract symptoms: Secondary | ICD-10-CM | POA: Diagnosis not present

## 2016-06-18 DIAGNOSIS — R252 Cramp and spasm: Secondary | ICD-10-CM | POA: Diagnosis not present

## 2016-06-18 DIAGNOSIS — Z0000 Encounter for general adult medical examination without abnormal findings: Secondary | ICD-10-CM | POA: Diagnosis not present

## 2016-06-18 DIAGNOSIS — E782 Mixed hyperlipidemia: Secondary | ICD-10-CM | POA: Diagnosis not present

## 2016-06-18 DIAGNOSIS — Z79899 Other long term (current) drug therapy: Secondary | ICD-10-CM | POA: Diagnosis not present

## 2016-06-18 DIAGNOSIS — R05 Cough: Secondary | ICD-10-CM | POA: Diagnosis not present

## 2016-06-18 DIAGNOSIS — I10 Essential (primary) hypertension: Secondary | ICD-10-CM | POA: Diagnosis not present

## 2016-06-25 DIAGNOSIS — X32XXXA Exposure to sunlight, initial encounter: Secondary | ICD-10-CM | POA: Diagnosis not present

## 2016-06-25 DIAGNOSIS — D0439 Carcinoma in situ of skin of other parts of face: Secondary | ICD-10-CM | POA: Diagnosis not present

## 2016-06-25 DIAGNOSIS — Z85828 Personal history of other malignant neoplasm of skin: Secondary | ICD-10-CM | POA: Diagnosis not present

## 2016-06-25 DIAGNOSIS — L570 Actinic keratosis: Secondary | ICD-10-CM | POA: Diagnosis not present

## 2016-06-25 DIAGNOSIS — D485 Neoplasm of uncertain behavior of skin: Secondary | ICD-10-CM | POA: Diagnosis not present

## 2016-07-17 DIAGNOSIS — L905 Scar conditions and fibrosis of skin: Secondary | ICD-10-CM | POA: Diagnosis not present

## 2016-07-17 DIAGNOSIS — D0439 Carcinoma in situ of skin of other parts of face: Secondary | ICD-10-CM | POA: Diagnosis not present

## 2016-08-02 ENCOUNTER — Other Ambulatory Visit: Payer: Self-pay | Admitting: Urology

## 2016-09-01 DIAGNOSIS — N400 Enlarged prostate without lower urinary tract symptoms: Secondary | ICD-10-CM | POA: Diagnosis not present

## 2016-09-01 DIAGNOSIS — R350 Frequency of micturition: Secondary | ICD-10-CM | POA: Diagnosis not present

## 2016-09-03 ENCOUNTER — Ambulatory Visit (INDEPENDENT_AMBULATORY_CARE_PROVIDER_SITE_OTHER): Payer: PPO | Admitting: Urology

## 2016-09-03 ENCOUNTER — Encounter: Payer: Self-pay | Admitting: Urology

## 2016-09-03 VITALS — BP 128/61 | HR 78 | Ht 71.25 in | Wt 194.0 lb

## 2016-09-03 DIAGNOSIS — C679 Malignant neoplasm of bladder, unspecified: Secondary | ICD-10-CM

## 2016-09-03 DIAGNOSIS — R319 Hematuria, unspecified: Secondary | ICD-10-CM | POA: Diagnosis not present

## 2016-09-03 LAB — URINALYSIS, COMPLETE
BILIRUBIN UA: NEGATIVE
GLUCOSE, UA: NEGATIVE
Ketones, UA: NEGATIVE
Leukocytes, UA: NEGATIVE
Nitrite, UA: NEGATIVE
Protein, UA: NEGATIVE
RBC, UA: NEGATIVE
Specific Gravity, UA: 1.015 (ref 1.005–1.030)
UUROB: 0.2 mg/dL (ref 0.2–1.0)
pH, UA: 7 (ref 5.0–7.5)

## 2016-09-03 MED ORDER — CIPROFLOXACIN HCL 500 MG PO TABS
500.0000 mg | ORAL_TABLET | Freq: Once | ORAL | Status: AC
  Administered 2016-09-03: 500 mg via ORAL

## 2016-09-03 MED ORDER — LIDOCAINE HCL 2 % EX GEL
1.0000 "application " | Freq: Once | CUTANEOUS | Status: AC
  Administered 2016-09-03: 1 via URETHRAL

## 2016-09-03 NOTE — Progress Notes (Signed)
   09/03/16  CC:  Chief Complaint  Patient presents with  . Bladder Cancer    HPI: 83 yo WM who presents today for his first produce repeat induction BCG cystoscopy.  Patient initially presented with asymptomatic incidental moderate right hydroureteronephrosis in 12/2014. He is taken to the OR on 01/30/2015 at which time right ureteroscopy revealed as suspicious area within the right distal ureter with a shaggy, irregular appearance but no obvious papillary or pedunculated tumor. Brush cytology was negative for any malignancy. Ureteral biopsy revealed some atypia favoring neoplasm but essentially was non-diagnostic.  Interval follow-up renal ultrasound on 05/02/2015 did show complete resolution of his hydronephrosis.  He was counseled to return for a second look but deferred this for ~6 months. He eventually return to the OR on 08/20/15 at which time the right distal ureteral was highly suspicious for papillary urothelial carcinoma was biopsied. 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  He returned to operating room on 09/12/2015 for a second look ureteroscopy/biopsy and TURBT. Given the depth of resection, mitomycin was not administered. Pathology showed no residual tumor in the bladder or the distal ureter. A Bard Optima stent was replaced at the time of the procedure.  He completed an induction course of BCG completed on 11/30/2015 6 doses. This was performed with the stent in place. The stent was subsequently removed.  Most recent renal ultrasound on 01/30/2016 shows no evidence of hydronephrosis bilaterally.  Surveillance cystoscopy performed on 02/06/2016 noted possible recurrence at bladder neck- recommend TURBT with mitomycin.  He was taken to the OR on 02/26/2016 for TURBT, bilateral RTG's and right ureteroscopy.  Pathology demonstrated bladder  cancer recurrence, HgT1 which is progression from previous CIS/ Ta disease. No muscle in the specimen.  Recommend return to OR for re-TUR with mitomycin per AUA/ NCCN guidelines due to risk up upstaging. Re-TUR occurred on 03/24/2016.  Pathology noted no evidence of residual tumor.   Blood pressure 128/61, pulse 78, height 5' 11.25" (1.81 m), weight 194 lb (88 kg). NED. A&Ox3.   No respiratory distress   Abd soft, NT, ND Normal phallus with bilateral descended testicles  Cystoscopy Procedure Note  Patient identification was confirmed, informed consent was obtained, and patient was prepped using Betadine solution.  Lidocaine jelly was administered per urethral meatus.    Preoperative abx where received prior to procedure.     Pre-Procedure: - Inspection reveals a normal caliber ureteral meatus.  Procedure: The flexible cystoscope was introduced without difficulty - No urethral strictures/lesions are present. - Enlarged prostate  - Normal bladder neck - Bilateral ureteral orifices identified - Bladder mucosa  reveals no ulcers, tumors, or lesions - No bladder stones - No trabeculation  Retroflexion shows no intravesical love   Post-Procedure: - Patient tolerated the procedure well  Assessment/ Plan:  1. Recurrent high-risk found muscle invasive bladder cancer Negative cystoscopy. Follow-up in 3 months for repeat cystoscopy. If this is normal, he will need maintenance BCG at that time.  2. History of CIS of the distal right ureter We'll send urine for cytology  3. BPH with history of retention Continue Flomax with CIC as needed

## 2016-09-08 ENCOUNTER — Other Ambulatory Visit: Payer: Self-pay | Admitting: Urology

## 2016-09-15 ENCOUNTER — Other Ambulatory Visit: Payer: Self-pay

## 2016-09-15 MED ORDER — TAMSULOSIN HCL 0.4 MG PO CAPS: 0.4000 mg | ORAL_CAPSULE | Freq: Every day | ORAL | 1 refills | Status: DC

## 2016-09-17 DIAGNOSIS — I10 Essential (primary) hypertension: Secondary | ICD-10-CM | POA: Diagnosis not present

## 2016-09-17 DIAGNOSIS — E782 Mixed hyperlipidemia: Secondary | ICD-10-CM | POA: Diagnosis not present

## 2016-09-17 DIAGNOSIS — Z125 Encounter for screening for malignant neoplasm of prostate: Secondary | ICD-10-CM | POA: Diagnosis not present

## 2016-09-17 DIAGNOSIS — Z0000 Encounter for general adult medical examination without abnormal findings: Secondary | ICD-10-CM | POA: Diagnosis not present

## 2016-09-17 DIAGNOSIS — Z79899 Other long term (current) drug therapy: Secondary | ICD-10-CM | POA: Diagnosis not present

## 2016-10-17 DIAGNOSIS — X32XXXA Exposure to sunlight, initial encounter: Secondary | ICD-10-CM | POA: Diagnosis not present

## 2016-10-17 DIAGNOSIS — D225 Melanocytic nevi of trunk: Secondary | ICD-10-CM | POA: Diagnosis not present

## 2016-10-17 DIAGNOSIS — L821 Other seborrheic keratosis: Secondary | ICD-10-CM | POA: Diagnosis not present

## 2016-10-17 DIAGNOSIS — Z85828 Personal history of other malignant neoplasm of skin: Secondary | ICD-10-CM | POA: Diagnosis not present

## 2016-10-17 DIAGNOSIS — Z08 Encounter for follow-up examination after completed treatment for malignant neoplasm: Secondary | ICD-10-CM | POA: Diagnosis not present

## 2016-10-17 DIAGNOSIS — L570 Actinic keratosis: Secondary | ICD-10-CM | POA: Diagnosis not present

## 2016-12-04 ENCOUNTER — Ambulatory Visit: Payer: PPO | Admitting: Urology

## 2016-12-04 ENCOUNTER — Encounter: Payer: Self-pay | Admitting: Urology

## 2016-12-04 VITALS — BP 135/72 | HR 81 | Ht 71.25 in | Wt 194.3 lb

## 2016-12-04 DIAGNOSIS — C679 Malignant neoplasm of bladder, unspecified: Secondary | ICD-10-CM | POA: Diagnosis not present

## 2016-12-04 DIAGNOSIS — R896 Abnormal cytological findings in specimens from other organs, systems and tissues: Secondary | ICD-10-CM | POA: Diagnosis not present

## 2016-12-04 DIAGNOSIS — R319 Hematuria, unspecified: Secondary | ICD-10-CM | POA: Diagnosis not present

## 2016-12-04 DIAGNOSIS — C801 Malignant (primary) neoplasm, unspecified: Secondary | ICD-10-CM | POA: Diagnosis not present

## 2016-12-04 LAB — URINALYSIS, COMPLETE
BILIRUBIN UA: NEGATIVE
Glucose, UA: NEGATIVE
Ketones, UA: NEGATIVE
Leukocytes, UA: NEGATIVE
NITRITE UA: NEGATIVE
PH UA: 7.5 (ref 5.0–7.5)
Protein, UA: NEGATIVE
RBC UA: NEGATIVE
SPEC GRAV UA: 1.01 (ref 1.005–1.030)
Urobilinogen, Ur: 1 mg/dL (ref 0.2–1.0)

## 2016-12-04 MED ORDER — CIPROFLOXACIN HCL 500 MG PO TABS
500.0000 mg | ORAL_TABLET | Freq: Once | ORAL | Status: AC
  Administered 2016-12-04: 500 mg via ORAL

## 2016-12-04 MED ORDER — LIDOCAINE HCL 2 % EX GEL
1.0000 | Freq: Once | CUTANEOUS | Status: AC
Start: 2016-12-04 — End: 2016-12-04
  Administered 2016-12-04: 1 via URETHRAL

## 2016-12-04 NOTE — Progress Notes (Signed)
   12/04/16  CC:  Chief Complaint  Patient presents with  . Bladder Cancer    HPI: The patient is an 83 year old gentleman presents today for a second surveillance cystoscopy after repeat induction BCG.   Patient initially presented with asymptomatic incidental moderate right hydroureteronephrosis in 12/2014. He is taken to the OR on 01/30/2015 at which time right ureteroscopy revealed as suspicious area within the right distal ureter with a shaggy, irregular appearance but no obvious papillary or pedunculated tumor. Brush cytology was negative for any malignancy. Ureteral biopsy revealed some atypia favoring neoplasm but essentially was non-diagnostic. Interval follow-up renal ultrasound on 05/02/2015 did show complete resolution of his hydronephrosis. He was counseled to return for a second look but deferred this for ~6 months. He eventually return to the OR on 08/20/15 at which time the right distal ureteral was highly suspicious for papillary urothelial carcinoma was biopsied. 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 He returned to operating room on 09/12/2015 for a second look ureteroscopy/biopsy and TURBT. Given the depth of resection, mitomycin was not administered. Pathology showed no residual tumor in the bladder or the distal ureter. A Bard Optima stent was replaced at the time of the procedure. He completed an induction course of BCG completed on 11/30/2015 6 doses. This was performed with the stent in place. The stent was subsequently removed. Most recent renal ultrasound on 01/30/2016 shows no evidence of hydronephrosis bilaterally. Surveillance cystoscopy performed on 02/06/2016 noted possible recurrence at bladder neck- recommend TURBT with mitomycin. He was taken to the OR on 02/26/2016 for TURBT, bilateral RTG's and right  ureteroscopy. 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 No muscle in the specimen. Recommend return to OR for re-TUR with mitomycin per AUA/ NCCN guidelines due to risk up upstaging. Re-TUR occurred on 03/24/2016. Pathology noted no evidence of residual tumor.   Blood pressure 135/72, pulse 81, height 5' 11.25" (1.81 m), weight 194 lb 4.8 oz (88.1 kg). NED. A&Ox3.   No respiratory distress   Abd soft, NT, ND Normal phallus with bilateral descended testicles  Cystoscopy Procedure Note  Patient identification was confirmed, informed consent was obtained, and patient was prepped using Betadine solution.  Lidocaine jelly was administered per urethral meatus.    Preoperative abx where received prior to procedure.     Pre-Procedure: - Inspection reveals a normal caliber ureteral meatus.  Procedure: The flexible cystoscope was introduced without difficulty - No urethral strictures/lesions are present. - Enlarged prostate  - Normal bladder neck - Bilateral ureteral orifices identified - Bladder mucosa  reveals no ulcers, tumors, or lesions - No bladder stones - No trabeculation  Retroflexion shows no intravesical lobe   Post-Procedure: - Patient tolerated the procedure well  Assessment/ Plan:  1. Recurrent high-risk non muscle invasive bladder cancer Negative cystoscopy. Follow-up for maintenance BCG. This will be his first round of maintenance BCG. We'll plan for cystoscopy and cytology 3 months after maintenance BCG.  2. History of CIS of the distal right ureter We'll send urine for cytology  3. BPH with history of retention Continue Flomax with CIC as needed

## 2016-12-08 ENCOUNTER — Other Ambulatory Visit: Payer: Self-pay | Admitting: Urology

## 2016-12-15 NOTE — Progress Notes (Addendum)
12/16/2016 10:14 AM   Anthony Gonzales 19-Apr-1931 [id number]  Referring provider: Idelle Crouch, MD [facility] [place], [place] [address]  Chief Complaint  Patient presents with  . Bladder Cancer    BCG 1 of 3    HPI: 83 yo WM who presents today for an maintance series of BCG, this will be # 1/3.  Anthony Gonzales    Background history Patient initially presented with asymptomatic incidental moderate right hydroureteronephrosis in 12/2014.    He is taken to the OR on 01/30/2015 at which time right ureteroscopy revealed as suspicious area within the right distal ureter with a shaggy, irregular appearance but no obvious papillary or pedunculated tumor. Brush cytology was negative for any malignancy.  Ureteral biopsy revealed some atypia favoring neoplasm but essentially was non-diagnostic.    Interval follow-up renal ultrasound on 05/02/2015 did show complete resolution of his hydronephrosis.  He was counseled to return for a second look but deferred this for ~6 months.    He eventually return to the OR on 08/20/15 at which time the right distal ureteral was highly suspicious for papillary urothelial carcinoma was biopsied. 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    He returned to operating room on 09/12/2015 for a second look ureteroscopy/biopsy and TURBT. Given the depth of resection, mitomycin was not administered.  Pathology showed no residual tumor in the bladder or the distal ureter. A Bard Optima stent was replaced at the time of the procedure.    He completed an induction course of BCG completed on 11/30/2015 6 doses. This was performed with the stent in place.  The stent was subsequently removed.    Most recent renal ultrasound on 01/30/2016 shows no evidence of hydronephrosis bilaterally.    Surveillance  cystoscopy performed on 02/06/2016 noted possible recurrence at bladder neck- recommend TURBT with mitomycin.    He was taken to the OR on 02/26/2016 for TURBT, bilateral RTG's and right ureteroscopy.  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  No muscle in the specimen.  Recommend return to OR for re-TUR with mitomycin per AUA/ NCCN guidelines due to risk up upstaging.    Re-TUR occurred on 03/24/2016.  Pathology noted no evidence of residual tumor.    He completed a second induction course of BCG on 06/03/2016 x 6 doses.   Surveillance cystoscopy performed on 09/03/2016 was NED.  Urine cytology was negative.  Surveillance cystoscopy performed on 12/04/2016 was NED.  Urine cytology noted atypical urothelial cells.    Never smoker.   He does have baseline chronic renal insufficiency.  Cr 1.3 in 06/2016.  He also has a history of recurrent nephrolithiasis although no stone seen on CT scan as of 12/2014.  He is history of BPH and postop urinary retention, occasionally CIC's as needed.  Today, he presents today for to begin his maintenance BCG.    He is complaining of frequency, urgency, nocturia, intermittency and a weak urinary stream.   He has not had a new cough.  He denies dysuria, gross hematuria and suprapubic pain.  He denies fevers, chills, nausea or vomiting at this time.  He is currently on an antibiotic for a sinus infection, Ceftin.  His UA today is positive for 11-30 WBC's.    He has not had any  flank pain or passage of stone fragments.    He continues the tamsulosin.  He has not had to CIC in over 6 months.   Today, he complains of frequency, nocturia, intermittency, hesitancy, straining to urinate and a weak urinary stream. These are all baseline symptoms. He denies gross hematuria, dysuria and suprapubic pain. He has not had fevers, chills, nausea or vomiting. He does not have a cough. His UA today is  negative.   PMH: Past Medical History:  Diagnosis Date  . Arthritis   . Benign fibroma of prostate 08/23/2013  . BPH (benign prostatic hyperplasia)   . Calculus of kidney 08/23/2013  . Cancer [facility])    bladder cancer and skin cancer  . Glaucoma    no drops in 3 mo pressure good, pt denies glaucoma, eye pressure has been measuring alright.  . History of kidney stones   . HLD (hyperlipidemia)   . HOH (hard of hearing)    Left Hearing Aid  . HTN (hypertension) 12/26/2014  . Hypertension   . Hyponatremia 12/26/2014  . Migraines    history of migraines when he was younger.  Anthony Gonzales Restless leg syndrome   . Sinus drainage   . UTI (lower urinary tract infection) 12/26/2014  . Vertigo     Surgical History: Past Surgical History:  Procedure Laterality Date  . COLONOSCOPY    . CYSTOSCOPY W/ RETROGRADES Right 01/30/2015   Procedure: CYSTOSCOPY WITH RETROGRADE PYELOGRAM;  Surgeon: [name], MD;  Location: [facility];  Service: Urology;  Laterality: Right;  . CYSTOSCOPY W/ RETROGRADES Bilateral 02/26/2016   Procedure: CYSTOSCOPY WITH RETROGRADE PYELOGRAM;  Surgeon: [name], MD;  Location: [facility];  Service: Urology;  Laterality: Bilateral;  . CYSTOSCOPY W/ URETERAL STENT PLACEMENT Right 08/20/2015   Procedure: CYSTOSCOPY WITH RETROGRADE PYELOGRAM/POSSIBLE URETERAL STENT PLACEMENT/BLADDER BIOPSY;  Surgeon: [name], MD;  Location: [facility];  Service: Urology;  Laterality: Right;  . CYSTOSCOPY W/ URETERAL STENT PLACEMENT Right 09/12/2015   Procedure: CYSTOSCOPY WITH STENT REPLACEMENT;  Surgeon: [name], MD;  Location: [facility];  Service: Urology;  Laterality: Right;  . CYSTOSCOPY WITH BIOPSY Right 09/12/2015   Procedure: CYSTOSCOPY WITH BLADDER AND URETERAL BIOPSY;  Surgeon: [name], MD;  Location: [facility];  Service: Urology;  Laterality: Right;  . CYSTOSCOPY WITH STENT PLACEMENT Right 01/30/2015   Procedure: CYSTOSCOPY WITH STENT PLACEMENT;  Surgeon: [name],  MD;  Location: [facility];  Service: Urology;  Laterality: Right;  . EYE SURGERY Bilateral    Cataract Extraction with IOL  . HOLMIUM LASER APPLICATION N/A 06/16/8117   Procedure:  HOLMIUM LASER APPLICATION;  Surgeon: [name], MD;  Location: [facility];  Service: Urology;  Laterality: N/A;  . SPERMATOCELECTOMY    . TONSILLECTOMY    . TRANSURETHRAL RESECTION OF BLADDER TUMOR N/A 02/26/2016   Procedure: TRANSURETHRAL RESECTION OF BLADDER TUMOR (TURBT);  Surgeon: [name], MD;  Location: [facility];  Service: Urology;  Laterality: N/A;  . TRANSURETHRAL RESECTION OF BLADDER TUMOR WITH MITOMYCIN-C N/A 09/12/2015   Procedure: TRANSURETHRAL RESECTION OF BLADDER TUMOR ;  Surgeon: [name], MD;  Location: [facility];  Service: Urology;  Laterality: N/A;  . TRANSURETHRAL RESECTION OF BLADDER TUMOR WITH MITOMYCIN-C N/A 03/24/2016   Procedure: TRANSURETHRAL RESECTION OF BLADDER TUMOR WITH MITOMYCIN-C  (SMALL);  Surgeon: [name], MD;  Location: [facility];  Service: Urology;  Laterality: N/A;  . URETEROSCOPY Right 01/30/2015   Procedure: URETEROSCOPY/ WITH BIOPSY AND CYTOLOGY BRUSHING;  Surgeon: [name], MD;  Location: [facility];  Service: Urology;  Laterality: Right;  . URETEROSCOPY Right 08/20/2015   Procedure: URETEROSCOPY;  Surgeon: [name], MD;  Location: [facility];  Service: Urology;  Laterality: Right;  . URETEROSCOPY Right 09/12/2015   Procedure: URETEROSCOPY;  Surgeon: [name], MD;  Location: [facility];  Service: Urology;  Laterality: Right;  . URETEROSCOPY Right 02/26/2016   Procedure: URETEROSCOPY;  Surgeon: [name], MD;  Location: [facility];  Service: Urology;  Laterality: Right;    Home Medications:  Allergies as of 12/16/2016      Reactions   Demerol [meperidine] Nausea And Vomiting   Lipitor [atorvastatin] Swelling   Sulfa Antibiotics Nausea And Vomiting, Rash      Medication List       Accurate as of 12/16/16 10:14 AM. Always use your most recent  med list.          acetaminophen 500 MG tablet Commonly known as:  TYLENOL Take 1,000 mg by mouth every 6 (six) hours as needed for moderate pain.   aspirin EC 81 MG tablet Take 81 mg by mouth daily.   benazepril-hydrochlorthiazide 20-12.5 MG tablet Commonly known as:  LOTENSIN HCT Take 1 tablet by mouth daily. In am.   docusate sodium 100 MG capsule Commonly known as:  COLACE Take 1 capsule (100 mg total) by mouth 2 (two) times daily.   fenofibrate micronized 134 MG capsule Commonly known as:  LOFIBRA Take 134 mg by mouth daily before breakfast.   multivitamin with minerals Tabs tablet Take 1 tablet by mouth daily.   psyllium 58.6 % packet Commonly known as:  METAMUCIL Take 1 packet by mouth daily.   tamsulosin 0.4 MG Caps capsule Commonly known as:  FLOMAX Take 1 capsule (0.4 mg total) by mouth daily.       Allergies:  Allergies  Allergen Reactions  . Demerol [Meperidine] Nausea And Vomiting  . Lipitor [Atorvastatin] Swelling  . Sulfa Antibiotics Nausea And Vomiting and Rash    Family History: Family History  Problem Relation Age of Onset  . Hypertension Mother   . Hypertension Father   . Prostate cancer Brother   . Kidney disease Neg Hx   . Kidney cancer Neg Hx   . Bladder Cancer Neg Hx     Social History:  reports that he has never smoked. He has never used smokeless tobacco. He reports that he does not drink alcohol or use drugs.  ROS: UROLOGY Frequent Urination?: Yes Hard to postpone urination?: No Burning/pain with urination?: No Get up at night to urinate?: Yes Leakage of urine?: No Urine stream starts and stops?: Yes Trouble starting stream?: Yes Do you have to strain to urinate?: Yes Blood in urine?: No Urinary tract infection?: No Sexually transmitted disease?: No Injury to kidneys or bladder?: No Painful intercourse?: No Weak stream?: Yes Erection problems?: No Penile pain?: No  Gastrointestinal Nausea?: No Vomiting?:  No Indigestion/heartburn?: No Diarrhea?: No Constipation?: No  Constitutional Fever: No Night sweats?: No Weight loss?: No Fatigue?: No  Skin Skin rash/lesions?: No Itching?: No  Eyes Blurred vision?: No Double vision?: No.  Ears/Nose/Throat Sore throat?: No Sinus problems?: Yes  Hematologic/Lymphatic Swollen glands?: No Easy bruising?: No  Cardiovascular Leg swelling?: No Chest pain?: No  Respiratory Cough?: Yes Shortness of breath?: No  Endocrine Excessive thirst?: No  Musculoskeletal Back pain?: No Joint pain?: No  Neurological Headaches?: No Dizziness?: No  Psychologic Depression?: No Anxiety?: No  Physical Exam: BP 121/73   Pulse 81   Ht 5' 11.75" (1.822 m)  Wt 193 lb 6.4 oz (87.7 kg)   BMI 26.41 kg/m   Constitutional: Well nourished. Alert and oriented, No acute distress. HEENT: [place] AT, moist mucus membranes. Trachea midline, no masses. Cardiovascular: No clubbing, cyanosis, or edema. Respiratory: Normal respiratory effort, no increased work of breathing. GI: Abdomen is soft, non tender, non distended, no abdominal masses. Liver and spleen not palpable.  No hernias appreciated.  Stool sample for occult testing is not indicated.   GU: No CVA tenderness.  No bladder fullness or masses.   Skin: No rashes, bruises or suspicious lesions. Lymph: No cervical or inguinal adenopathy. Neurologic: Grossly intact, no focal deficits, moving all 4 extremities. Psychiatric: Normal mood and affect.  Laboratory Data: Lab Results  Component Value Date   WBC 6.5 03/17/2016   HGB 13.9 03/17/2016   HCT 41.2 03/17/2016   MCV 88.0 03/17/2016   PLT 260 03/17/2016    Lab Results  Component Value Date   CREATININE 1.34 (H) 03/17/2016    Urinalysis Negative  See EPIC.    I have reviewed the labs.  Assessment & Plan:    1. Recurrent high-risk non muscle invasive bladder cancer  - Reviewed BCG treatment course, possible side effects including BCG  sepsis, bladder irritation, worsening of his urinary symptoms  - # 1 of 3 BCG installed today  - Patient was instructed to pour bleach down his/her toilet for the next 6 hours  -  Instructed to call the office if she should experience fevers greater than 102, chills/rigors, onset of a new cough, night sweats or further bladder spasms or inability to urinate   - RTC in one week for # 2 BCG  - Surveillance protocol also discussed today including cystoscopy every 3 months for at least 2 years and then spread out thereafter  - Urinalysis, Complete  - bcg vaccine injection 81 mg; Instill 3.24 mLs (81 mg total) into the bladder once.  - lidocaine (XYLOCAINE) 2 % jelly 1 application; Place 1 application into the urethra once.  2. Right distal ureteral cancer  - Previously no interested in distal ureterectomy/ nephrou  - S/p multiple biopsies/ laser ablation with stent/ BCG course  - In presence of bladder neck lesion, we will also re inspect right distal ureter in the OR.  Plan for URS/ RTG/ possible biopsy-  the right distal ureter was somewhat dilated but no suspicious lesions during 03/2016 procedure  3. History of nephrolithiasis  - Currently stone free.               4. Nocturia/ history of retention/ BPH with sensation of incomplete bladder emptying  - Continue Flomax  - CIC as needed  - PVR minimal   Return in about 1 week (around 12/23/2016) for # 2 /3 BCG.  These notes generated with voice recognition software. I apologize for typographical errors.  Zara Council, [facility] [address], [place], [place] [address] [phone]

## 2016-12-16 ENCOUNTER — Encounter: Payer: Self-pay | Admitting: Urology

## 2016-12-16 ENCOUNTER — Ambulatory Visit (INDEPENDENT_AMBULATORY_CARE_PROVIDER_SITE_OTHER): Payer: PPO | Admitting: Urology

## 2016-12-16 VITALS — BP 121/73 | HR 81 | Ht 71.75 in | Wt 193.4 lb

## 2016-12-16 DIAGNOSIS — C679 Malignant neoplasm of bladder, unspecified: Secondary | ICD-10-CM | POA: Diagnosis not present

## 2016-12-16 DIAGNOSIS — C661 Malignant neoplasm of right ureter: Secondary | ICD-10-CM

## 2016-12-16 DIAGNOSIS — Z87898 Personal history of other specified conditions: Secondary | ICD-10-CM

## 2016-12-16 DIAGNOSIS — Z87442 Personal history of urinary calculi: Secondary | ICD-10-CM

## 2016-12-16 LAB — URINALYSIS, COMPLETE
Bilirubin, UA: NEGATIVE
GLUCOSE, UA: NEGATIVE
Ketones, UA: NEGATIVE
LEUKOCYTES UA: NEGATIVE
Nitrite, UA: NEGATIVE
PH UA: 7.5 (ref 5.0–7.5)
PROTEIN UA: NEGATIVE
SPEC GRAV UA: 1.015 (ref 1.005–1.030)
Urobilinogen, Ur: 1 mg/dL (ref 0.2–1.0)

## 2016-12-16 MED ORDER — LIDOCAINE HCL 2 % EX GEL
1.0000 | Freq: Once | CUTANEOUS | Status: AC
Start: 2016-12-16 — End: 2016-12-16
  Administered 2016-12-16: 1 via URETHRAL

## 2016-12-16 MED ORDER — BCG LIVE 50 MG IS SUSR
3.2400 mL | Freq: Once | INTRAVESICAL | Status: AC
  Administered 2016-12-16: 81 mg via INTRAVESICAL

## 2016-12-16 NOTE — Progress Notes (Signed)
BCG Bladder Instillation  BCG # Maint 1 of 3  Due to Bladder Cancer patient is present today for a BCG treatment. Patient was cleaned and prepped in a sterile fashion with betadine and lidocaine 2% jelly was instilled into the urethra.  A 14FR catheter was inserted, urine return was noted 114ml, urine was yellow in color.  71ml of reconstituted BCG was instilled into the bladder. The catheter was then removed. Patient tolerated well, no complications were noted . Preformed by: Zara Council PA-C and Lyndee Hensen CMA  Follow up/ Additional notes: One week

## 2016-12-17 DIAGNOSIS — Z79899 Other long term (current) drug therapy: Secondary | ICD-10-CM | POA: Diagnosis not present

## 2016-12-17 DIAGNOSIS — Z125 Encounter for screening for malignant neoplasm of prostate: Secondary | ICD-10-CM | POA: Diagnosis not present

## 2016-12-17 DIAGNOSIS — I10 Essential (primary) hypertension: Secondary | ICD-10-CM | POA: Diagnosis not present

## 2016-12-17 DIAGNOSIS — E782 Mixed hyperlipidemia: Secondary | ICD-10-CM | POA: Diagnosis not present

## 2016-12-18 DIAGNOSIS — N390 Urinary tract infection, site not specified: Secondary | ICD-10-CM | POA: Diagnosis not present

## 2016-12-18 DIAGNOSIS — R3129 Other microscopic hematuria: Secondary | ICD-10-CM | POA: Diagnosis not present

## 2016-12-22 NOTE — Progress Notes (Signed)
12/23/2016 9:17 AM   Anthony Gonzales Feb 19, 1932 [id number]  Referring provider: Idelle Crouch, MD [facility] [place], [place] [address]  Chief Complaint  Patient presents with  . Bladder Cancer    BCG  2 of 3    HPI: 83 yo WM who presents today for an maintance series of BCG, this will be # 2/3.  [name]    Background history Patient initially presented with asymptomatic incidental moderate right hydroureteronephrosis in 12/2014.    He is taken to the OR on 01/30/2015 at which time right ureteroscopy revealed as suspicious area within the right distal ureter with a shaggy, irregular appearance but no obvious papillary or pedunculated tumor. Brush cytology was negative for any malignancy.  Ureteral biopsy revealed some atypia favoring neoplasm but essentially was non-diagnostic.    Interval follow-up renal ultrasound on 05/02/2015 did show complete resolution of his hydronephrosis.  He was counseled to return for a second look but deferred this for ~6 months.    He eventually return to the OR on 08/20/15 at which time the right distal ureteral was highly suspicious for papillary urothelial carcinoma was biopsied. 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    He returned to operating room on 09/12/2015 for a second look ureteroscopy/biopsy and TURBT. Given the depth of resection, mitomycin was not administered.  Pathology showed no residual tumor in the bladder or the distal ureter. A Bard Optima stent was replaced at the time of the procedure.    He completed an induction course of BCG completed on 11/30/2015 6 doses. This was performed with the stent in place.  The stent was subsequently removed.    Most recent renal ultrasound on 01/30/2016 shows no evidence of hydronephrosis bilaterally.    Surveillance  cystoscopy performed on 02/06/2016 noted possible recurrence at bladder neck- recommend TURBT with mitomycin.    He was taken to the OR on 02/26/2016 for TURBT, bilateral RTG's and right ureteroscopy.  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  No muscle in the specimen.  Recommend return to OR for re-TUR with mitomycin per AUA/ NCCN guidelines due to risk up upstaging.    Re-TUR occurred on 03/24/2016.  Pathology noted no evidence of residual tumor.    He completed a second induction course of BCG on 06/03/2016 x 6 doses.   Surveillance cystoscopy performed on 09/03/2016 was NED.  Urine cytology was negative.  Surveillance cystoscopy performed on 12/04/2016 was NED.  Urine cytology noted atypical urothelial cells.    Never smoker.   He does have baseline chronic renal insufficiency.  Cr 1.3 in 06/2016.  He also has a history of recurrent nephrolithiasis although no stone seen on CT scan as of 12/2014.  He is history of BPH and postop urinary retention, occasionally CIC's as needed.  Today, he presents today for his maintenance BCG.    He is complaining of frequency, intermittency, hesitancy and a weak urinary stream.   He has not had a new cough.  He denies dysuria, gross hematuria and suprapubic pain.  He denies fevers, chills, nausea or vomiting at this time.  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 His UA today is negative.    He has not had any flank pain or passage of  stone fragments.    He continues the tamsulosin.  He has not had to CIC in over 6 months.     PMH: Past Medical History:  Diagnosis Date  . Arthritis   . Benign fibroma of prostate 08/23/2013  . BPH (benign prostatic hyperplasia)   . Calculus of kidney 08/23/2013  . Cancer [facility])    bladder cancer and skin cancer  . Glaucoma    no drops in 3 mo pressure good, pt denies glaucoma, eye pressure has been measuring alright.  . History of  kidney stones   . HLD (hyperlipidemia)   . HOH (hard of hearing)    Left Hearing Aid  . HTN (hypertension) 12/26/2014  . Hypertension   . Hyponatremia 12/26/2014  . Migraines    history of migraines when he was younger.  [name] Restless leg syndrome   . Sinus drainage   . UTI (lower urinary tract infection) 12/26/2014  . Vertigo     Surgical History: Past Surgical History:  Procedure Laterality Date  . COLONOSCOPY    . CYSTOSCOPY W/ RETROGRADES Right 01/30/2015   Procedure: CYSTOSCOPY WITH RETROGRADE PYELOGRAM;  Surgeon: [name], MD;  Location: [facility] ORS;  Service: Urology;  Laterality: Right;  . CYSTOSCOPY W/ RETROGRADES Bilateral 02/26/2016   Procedure: CYSTOSCOPY WITH RETROGRADE PYELOGRAM;  Surgeon: [name], MD;  Location: [facility] OR[facility];  Service: Urology;  Laterality: Bilateral;  . CYSTOSCOPY W/ URETERAL STENT PLACEMENT Right 08/20/2015   Procedure: CYSTOSCOPY WITH RETROGRADE PYELOGRAM/POSSIBLE URETERAL STENT PLACEMENT/BLADDER BIOPSY;  Surgeon: [name], MD;  Location: [facility] ORS;  Service: Urology;  Laterality: Right;  . CYSTOSCOPY W/ URETERAL STENT PLACEMENT Right 09/12/2015   Procedure: CYSTOSCOPY WITH STENT REPLACEMENT;  Surgeon: [name], MD;  Location: [facility] OR[facility];  Service: Urology;  Laterality: Right;  . CYSTOSCOPY WITH BIOPSY Right 09/12/2015   Procedure: CYSTOSCOPY WITH BLADDER AND URETERAL BIOPSY;  Surgeon: [name], MD;  Location: [facility] OR[facility];  Service: Urology;  Laterality: Right;  . CYSTOSCOPY WITH STENT PLACEMENT Right 01/30/2015   Procedure: CYSTOSCOPY WITH STENT PLACEMENT;  Surgeon: [name], MD;  Location: [facility];  Service: Urology;  Laterality: Right;  . EYE SURGERY Bilateral    Cataract Extraction with IOL  . HOLMIUM LASER APPLICATION N/A 1/82/9937   Procedure:  HOLMIUM LASER APPLICATION;  Surgeon: [name], MD;  Location: [facility];  Service: Urology;  Laterality: N/A;  . SPERMATOCELECTOMY    . TONSILLECTOMY    . TRANSURETHRAL  RESECTION OF BLADDER TUMOR N/A 02/26/2016   Procedure: TRANSURETHRAL RESECTION OF BLADDER TUMOR (TURBT);  Surgeon: [name], MD;  Location: [facility];  Service: Urology;  Laterality: N/A;  . TRANSURETHRAL RESECTION OF BLADDER TUMOR WITH MITOMYCIN-C N/A 09/12/2015   Procedure: TRANSURETHRAL RESECTION OF BLADDER TUMOR ;  Surgeon: [name], MD;  Location: [facility];  Service: Urology;  Laterality: N/A;  . TRANSURETHRAL RESECTION OF BLADDER TUMOR WITH MITOMYCIN-C N/A 03/24/2016   Procedure: TRANSURETHRAL RESECTION OF BLADDER TUMOR WITH MITOMYCIN-C  (SMALL);  Surgeon: [name], MD;  Location: [facility];  Service: Urology;  Laterality: N/A;  . URETEROSCOPY Right 01/30/2015   Procedure: URETEROSCOPY/ WITH BIOPSY AND CYTOLOGY BRUSHING;  Surgeon: [name], MD;  Location: [facility];  Service: Urology;  Laterality: Right;  . URETEROSCOPY Right 08/20/2015   Procedure: URETEROSCOPY;  Surgeon: [name], MD;  Location: [facility];  Service: Urology;  Laterality: Right;  . URETEROSCOPY Right 09/12/2015   Procedure: URETEROSCOPY;  Surgeon: [name], MD;  Location: [facility];  Service: Urology;  Laterality: Right;  .  URETEROSCOPY Right 02/26/2016   Procedure: URETEROSCOPY;  Surgeon: [name], MD;  Location: [facility];  Service: Urology;  Laterality: Right;    Home Medications:  Allergies as of 12/23/2016      Reactions   Demerol [meperidine] Nausea And Vomiting   Lipitor [atorvastatin] Swelling   Sulfa Antibiotics Nausea And Vomiting, Rash      Medication List       Accurate as of 12/23/16  9:17 AM. Always use your most recent med list.          acetaminophen 500 MG tablet Commonly known as:  TYLENOL Take 1,000 mg by mouth every 6 (six) hours as needed for moderate pain.   aspirin EC 81 MG tablet Take 81 mg by mouth daily.   benazepril-hydrochlorthiazide 20-12.5 MG tablet Commonly known as:  LOTENSIN HCT Take 1 tablet by mouth daily. In am.   docusate sodium 100  MG capsule Commonly known as:  COLACE Take 1 capsule (100 mg total) by mouth 2 (two) times daily.   fenofibrate micronized 134 MG capsule Commonly known as:  LOFIBRA Take 134 mg by mouth daily before breakfast.   guaiFENesin-codeine 100-10 MG/5ML syrup Take by mouth.   multivitamin capsule Take by mouth.   multivitamin with minerals Tabs tablet Take 1 tablet by mouth daily.   POTASSIUM BICARBONATE PO Take by mouth.   psyllium 58.6 % packet Commonly known as:  METAMUCIL Take 1 packet by mouth daily.   tamsulosin 0.4 MG Caps capsule Commonly known as:  FLOMAX Take 1 capsule (0.4 mg total) by mouth daily.       Allergies:  Allergies  Allergen Reactions  . Demerol [Meperidine] Nausea And Vomiting  . Lipitor [Atorvastatin] Swelling  . Sulfa Antibiotics Nausea And Vomiting and Rash    Family History: Family History  Problem Relation Age of Onset  . Hypertension Mother   . Hypertension Father   . Prostate cancer Brother   . Kidney disease Neg Hx   . Kidney cancer Neg Hx   . Bladder Cancer Neg Hx     Social History:  reports that he has never smoked. He has never used smokeless tobacco. He reports that he does not drink alcohol or use drugs.  ROS: UROLOGY Frequent Urination?: Yes Hard to postpone urination?: No Burning/pain with urination?: No Get up at night to urinate?: No Leakage of urine?: No Urine stream starts and stops?: Yes Trouble starting stream?: Yes Do you have to strain to urinate?: Yes Blood in urine?: No Urinary tract infection?: No Sexually transmitted disease?: No Injury to kidneys or bladder?: No Painful intercourse?: No Weak stream?: No Erection problems?: No Penile pain?: No  Gastrointestinal Nausea?: No Vomiting?: No Indigestion/heartburn?: No Diarrhea?: No Constipation?: No  Constitutional Fever: No Night sweats?: No Weight loss?: No Fatigue?: No  Skin Skin rash/lesions?: No Itching?: No  Eyes Blurred vision?:  No Double vision?: No.  Ears/Nose/Throat Sore throat?: No Sinus problems?: Yes  Hematologic/Lymphatic Swollen glands?: No Easy bruising?: No  Cardiovascular Leg swelling?: No Chest pain?: No  Respiratory Cough?: Yes Shortness of breath?: No  Endocrine Excessive thirst?: No  Musculoskeletal Back pain?: No Joint pain?: No  Neurological Headaches?: No Dizziness?: No  Psychologic Depression?: No Anxiety?: No  Physical Exam: BP 124/70   Pulse 87   Ht 5' 11.75" (1.822 m)   Wt 196 lb 8 oz (89.1 kg)   BMI 26.84 kg/m   Constitutional: Well nourished. Alert and oriented, No acute distress. HEENT: [place] AT, moist mucus membranes.  Trachea midline, no masses. Cardiovascular: No clubbing, cyanosis, or edema. Respiratory: Normal respiratory effort, no increased work of breathing. GI: Abdomen is soft, non tender, non distended, no abdominal masses. Liver and spleen not palpable.  No hernias appreciated.  Stool sample for occult testing is not indicated.   GU: No CVA tenderness.  No bladder fullness or masses.   Skin: No rashes, bruises or suspicious lesions. Lymph: No cervical or inguinal adenopathy. Neurologic: Grossly intact, no focal deficits, moving all 4 extremities. Psychiatric: Normal mood and affect.  Laboratory Data: Lab Results  Component Value Date   WBC 6.5 03/17/2016   HGB 13.9 03/17/2016   HCT 41.2 03/17/2016   MCV 88.0 03/17/2016   PLT 260 03/17/2016    Lab Results  Component Value Date   CREATININE 1.34 (H) 03/17/2016    Urinalysis Negative  See EPIC.    I have reviewed the labs.  Assessment & Plan:    1. Recurrent high-risk non muscle invasive bladder cancer  - Reviewed BCG treatment course, possible side effects including BCG sepsis, bladder irritation, worsening of his urinary symptoms  - # 2 of 3 BCG installed today  - Patient was instructed to pour bleach down his/her toilet for the next 6 hours  -  Instructed to call the office if she  should experience fevers greater than 102, chills/rigors, onset of a new cough, night sweats or further bladder spasms or inability to urinate   - RTC in one week for # 3 BCG  - Surveillance protocol also discussed today including cystoscopy every 3 months for at least 2 years and then spread out thereafter  - Urinalysis, Complete  - bcg vaccine injection 81 mg; Instill 3.24 mLs (81 mg total) into the bladder once.  - lidocaine (XYLOCAINE) 2 % jelly 1 application; Place 1 application into the urethra once.  2. Right distal ureteral cancer  - Previously not interested in distal ureterectomy/ nephrou  - S/p multiple biopsies/ laser ablation with stent/ BCG course  - In presence of bladder neck lesion, we will also re inspect right distal ureter in the OR.  Plan for URS/ RTG/ possible biopsy-  the right distal ureter was somewhat dilated but no suspicious lesions during 03/2016 procedure  3. History of nephrolithiasis  - Currently stone free.               4. Nocturia/ history of retention/ BPH with sensation of incomplete bladder emptying  - Continue Flomax  - CIC as needed  - PVR minimal   Return in about 1 week (around 12/30/2016) for # 3 BCG.  These notes generated with voice recognition software. I apologize for typographical errors.  Zara Council, [facility] [address] [place], [place] [address] [phone]

## 2016-12-23 ENCOUNTER — Encounter: Payer: Self-pay | Admitting: Urology

## 2016-12-23 ENCOUNTER — Ambulatory Visit (INDEPENDENT_AMBULATORY_CARE_PROVIDER_SITE_OTHER): Payer: PPO | Admitting: Urology

## 2016-12-23 VITALS — BP 124/70 | HR 87 | Ht 71.75 in | Wt 196.5 lb

## 2016-12-23 DIAGNOSIS — R351 Nocturia: Secondary | ICD-10-CM

## 2016-12-23 DIAGNOSIS — C679 Malignant neoplasm of bladder, unspecified: Secondary | ICD-10-CM | POA: Diagnosis not present

## 2016-12-23 DIAGNOSIS — C661 Malignant neoplasm of right ureter: Secondary | ICD-10-CM | POA: Diagnosis not present

## 2016-12-23 DIAGNOSIS — Z87442 Personal history of urinary calculi: Secondary | ICD-10-CM | POA: Diagnosis not present

## 2016-12-23 DIAGNOSIS — Z87898 Personal history of other specified conditions: Secondary | ICD-10-CM

## 2016-12-23 LAB — URINALYSIS, COMPLETE
BILIRUBIN UA: NEGATIVE
GLUCOSE, UA: NEGATIVE
Ketones, UA: NEGATIVE
LEUKOCYTES UA: NEGATIVE
Nitrite, UA: NEGATIVE
PH UA: 6.5 (ref 5.0–7.5)
PROTEIN UA: NEGATIVE
RBC UA: NEGATIVE
SPEC GRAV UA: 1.01 (ref 1.005–1.030)
Urobilinogen, Ur: 0.2 mg/dL (ref 0.2–1.0)

## 2016-12-23 MED ORDER — LIDOCAINE HCL 2 % EX GEL
1.0000 "application " | Freq: Once | CUTANEOUS | Status: AC
  Administered 2016-12-23: 1 via URETHRAL

## 2016-12-23 MED ORDER — BCG LIVE 50 MG IS SUSR
3.2400 mL | Freq: Once | INTRAVESICAL | Status: AC
  Administered 2016-12-23: 81 mg via INTRAVESICAL

## 2016-12-23 NOTE — Progress Notes (Signed)
BCG Bladder Instillation  BCG # 2 of 3  Due to Bladder Cancer patient is present today for a BCG treatment. Patient was cleaned and prepped in a sterile fashion with betadine and lidocaine 2% jelly was instilled into the urethra.  A 14FR catheter was inserted, urine return was noted 152ml, urine was yellow in color.  59ml of reconstituted BCG was instilled into the bladder. The catheter was then removed. Patient tolerated well, no complications were noted.  Preformed by: Zara Council PA-C and Lyndee Hensen CMA  Follow up/ Additional notes: One week

## 2016-12-24 DIAGNOSIS — E782 Mixed hyperlipidemia: Secondary | ICD-10-CM | POA: Diagnosis not present

## 2016-12-24 DIAGNOSIS — Z9109 Other allergy status, other than to drugs and biological substances: Secondary | ICD-10-CM | POA: Diagnosis not present

## 2016-12-24 DIAGNOSIS — Z79899 Other long term (current) drug therapy: Secondary | ICD-10-CM | POA: Diagnosis not present

## 2016-12-24 DIAGNOSIS — I10 Essential (primary) hypertension: Secondary | ICD-10-CM | POA: Diagnosis not present

## 2016-12-24 DIAGNOSIS — Z23 Encounter for immunization: Secondary | ICD-10-CM | POA: Diagnosis not present

## 2016-12-29 NOTE — Progress Notes (Signed)
12/30/2016 8:23 AM   Anthony Gonzales 10-20-1931 [id number]  Referring provider: Idelle Crouch, MD [facility] [facility] [place], [place] [address]  Chief Complaint  Patient presents with  . Bladder Cancer    BCG 3 of 3    HPI: 83 yo WM who presents today for a maintenance series of BCG, this will be # 3/3.  [name]    Background history Patient initially presented with asymptomatic incidental moderate right hydroureteronephrosis in 12/2014.    He is taken to the OR on 01/30/2015 at which time right ureteroscopy revealed as suspicious area within the right distal ureter with a shaggy, irregular appearance but no obvious papillary or pedunculated tumor. Brush cytology was negative for any malignancy.  Ureteral biopsy revealed some atypia favoring neoplasm but essentially was non-diagnostic.    Interval follow-up renal ultrasound on 05/02/2015 did show complete resolution of his hydronephrosis.  He was counseled to return for a second look but deferred this for ~6 months.    He eventually return to the OR on 08/20/15 at which time the right distal ureteral was highly suspicious for papillary urothelial carcinoma was biopsied. 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    He returned to operating room on 09/12/2015 for a second look ureteroscopy/biopsy and TURBT. Given the depth of resection, mitomycin was not administered.  Pathology showed no residual tumor in the bladder or the distal ureter. A Bard Optima stent was replaced at the time of the procedure.    He completed an induction course of BCG completed on 11/30/2015 6 doses. This was performed with the stent in place.  The stent was subsequently removed.    Most recent renal ultrasound on 01/30/2016 shows no evidence of hydronephrosis bilaterally.    Surveillance  cystoscopy performed on 02/06/2016 noted possible recurrence at bladder neck- recommend TURBT with mitomycin.    He was taken to the OR on 02/26/2016 for TURBT, bilateral RTG's and right ureteroscopy.  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  No muscle in the specimen.  Recommend return to OR for re-TUR with mitomycin per AUA/ NCCN guidelines due to risk up upstaging.    Re-TUR occurred on 03/24/2016.  Pathology noted no evidence of residual tumor.    He completed a second induction course of BCG on 06/03/2016 x 6 doses.   Surveillance cystoscopy performed on 09/03/2016 was NED.  Urine cytology was negative.  Surveillance cystoscopy performed on 12/04/2016 was NED.  Urine cytology noted atypical urothelial cells.    Never smoker.   He does have baseline chronic renal insufficiency.  Cr 1.3 in 06/2016.  He also has a history of recurrent nephrolithiasis although no stone seen on CT scan as of 12/2014.  He is history of BPH and postop urinary retention, occasionally CIC's as needed.  Today, he presents today for his maintenance BCG.    He is complaining of nocturia, intermittency, hesitancy, straining to urinate and a weak urinary stream.   He has not had a new cough.  He denies dysuria, gross hematuria and suprapubic pain.  He denies fevers, chills, nausea or vomiting at this time.  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 His UA today is negative.   He did receive the flu shot on Thursday and  had a day of low-grade fever and malaise, but he did recover from that over the weekend.  He has not had any flank pain or passage of stone fragments.    He continues the tamsulosin.  He has not had to CIC in over 6 months.     PMH: Past Medical History:  Diagnosis Date  . Arthritis   . Benign fibroma of prostate 08/23/2013  . BPH (benign prostatic hyperplasia)   . Calculus of kidney 08/23/2013  . Cancer [facility])     bladder cancer and skin cancer  . Glaucoma    no drops in 3 mo pressure good, pt denies glaucoma, eye pressure has been measuring alright.  . History of kidney stones   . HLD (hyperlipidemia)   . HOH (hard of hearing)    Left Hearing Aid  . HTN (hypertension) 12/26/2014  . Hypertension   . Hyponatremia 12/26/2014  . Migraines    history of migraines when he was younger.  [name] Restless leg syndrome   . Sinus drainage   . UTI (lower urinary tract infection) 12/26/2014  . Vertigo     Surgical History: Past Surgical History:  Procedure Laterality Date  . COLONOSCOPY    . CYSTOSCOPY W/ RETROGRADES Right 01/30/2015   Procedure: CYSTOSCOPY WITH RETROGRADE PYELOGRAM;  Surgeon: [name], MD;  Location: [facility] ORS;  Service: Urology;  Laterality: Right;  . CYSTOSCOPY W/ RETROGRADES Bilateral 02/26/2016   Procedure: CYSTOSCOPY WITH RETROGRADE PYELOGRAM;  Surgeon: [name], MD;  Location: [facility];  Service: Urology;  Laterality: Bilateral;  . CYSTOSCOPY W/ URETERAL STENT PLACEMENT Right 08/20/2015   Procedure: CYSTOSCOPY WITH RETROGRADE PYELOGRAM/POSSIBLE URETERAL STENT PLACEMENT/BLADDER BIOPSY;  Surgeon: [name], MD;  Location: [facility];  Service: Urology;  Laterality: Right;  . CYSTOSCOPY W/ URETERAL STENT PLACEMENT Right 09/12/2015   Procedure: CYSTOSCOPY WITH STENT REPLACEMENT;  Surgeon: [name], MD;  Location: [facility];  Service: Urology;  Laterality: Right;  . CYSTOSCOPY WITH BIOPSY Right 09/12/2015   Procedure: CYSTOSCOPY WITH BLADDER AND URETERAL BIOPSY;  Surgeon: [name], MD;  Location: [facility];  Service: Urology;  Laterality: Right;  . CYSTOSCOPY WITH STENT PLACEMENT Right 01/30/2015   Procedure: CYSTOSCOPY WITH STENT PLACEMENT;  Surgeon: [name], MD;  Location: [facility];  Service: Urology;  Laterality: Right;  . EYE SURGERY Bilateral    Cataract Extraction with IOL  . HOLMIUM LASER APPLICATION N/A 1/61/0960   Procedure:  HOLMIUM LASER APPLICATION;   Surgeon: [name], MD;  Location: [facility];  Service: Urology;  Laterality: N/A;  . SPERMATOCELECTOMY    . TONSILLECTOMY    . TRANSURETHRAL RESECTION OF BLADDER TUMOR N/A 02/26/2016   Procedure: TRANSURETHRAL RESECTION OF BLADDER TUMOR (TURBT);  Surgeon: [name], MD;  Location: [facility];  Service: Urology;  Laterality: N/A;  . TRANSURETHRAL RESECTION OF BLADDER TUMOR WITH MITOMYCIN-C N/A 09/12/2015   Procedure: TRANSURETHRAL RESECTION OF BLADDER TUMOR ;  Surgeon: [name], MD;  Location: [facility];  Service: Urology;  Laterality: N/A;  . TRANSURETHRAL RESECTION OF BLADDER TUMOR WITH MITOMYCIN-C N/A 03/24/2016   Procedure: TRANSURETHRAL RESECTION OF BLADDER TUMOR WITH MITOMYCIN-C  (SMALL);  Surgeon: [name], MD;  Location: [facility];  Service: Urology;  Laterality: N/A;  . URETEROSCOPY Right 01/30/2015   Procedure: URETEROSCOPY/ WITH BIOPSY AND CYTOLOGY BRUSHING;  Surgeon: [name], MD;  Location: [facility];  Service: Urology;  Laterality: Right;  . URETEROSCOPY Right 08/20/2015   Procedure: URETEROSCOPY;  Surgeon: [name], MD;  Location: [facility];  Service:  Urology;  Laterality: Right;  . URETEROSCOPY Right 09/12/2015   Procedure: URETEROSCOPY;  Surgeon: [name], MD;  Location: [facility];  Service: Urology;  Laterality: Right;  . URETEROSCOPY Right 02/26/2016   Procedure: URETEROSCOPY;  Surgeon: [name], MD;  Location: [facility];  Service: Urology;  Laterality: Right;    Home Medications:  Allergies as of 12/30/2016      Reactions   Demerol [meperidine] Nausea And Vomiting   Lipitor [atorvastatin] Swelling   Sulfa Antibiotics Nausea And Vomiting, Rash      Medication List       Accurate as of 12/30/16  8:23 AM. Always use your most recent med list.          acetaminophen 500 MG tablet Commonly known as:  TYLENOL Take 1,000 mg by mouth every 6 (six) hours as needed for moderate pain.   aspirin EC 81 MG tablet Take 81 mg by mouth  daily.   benazepril-hydrochlorthiazide 20-12.5 MG tablet Commonly known as:  LOTENSIN HCT Take 1 tablet by mouth daily. In am.   docusate sodium 100 MG capsule Commonly known as:  COLACE Take 1 capsule (100 mg total) by mouth 2 (two) times daily.   fenofibrate micronized 134 MG capsule Commonly known as:  LOFIBRA Take 134 mg by mouth daily before breakfast.   guaiFENesin-codeine 100-10 MG/5ML syrup Take by mouth.   multivitamin capsule Take by mouth.   multivitamin with minerals Tabs tablet Take 1 tablet by mouth daily.   POTASSIUM BICARBONATE PO Take by mouth.   psyllium 58.6 % packet Commonly known as:  METAMUCIL Take 1 packet by mouth daily.   tamsulosin 0.4 MG Caps capsule Commonly known as:  FLOMAX Take 1 capsule (0.4 mg total) by mouth daily.       Allergies:  Allergies  Allergen Reactions  . Demerol [Meperidine] Nausea And Vomiting  . Lipitor [Atorvastatin] Swelling  . Sulfa Antibiotics Nausea And Vomiting and Rash    Family History: Family History  Problem Relation Age of Onset  . Hypertension Mother   . Hypertension Father   . Prostate cancer Brother   . Kidney disease Neg Hx   . Kidney cancer Neg Hx   . Bladder Cancer Neg Hx     Social History:  reports that he has never smoked. He has never used smokeless tobacco. He reports that he does not drink alcohol or use drugs.  ROS: UROLOGY Frequent Urination?: No Hard to postpone urination?: No Burning/pain with urination?: No Get up at night to urinate?: Yes Leakage of urine?: No Urine stream starts and stops?: Yes Trouble starting stream?: Yes Do you have to strain to urinate?: Yes Blood in urine?: No Urinary tract infection?: No Sexually transmitted disease?: No Injury to kidneys or bladder?: No Painful intercourse?: No Weak stream?: Yes Erection problems?: No Penile pain?: No  Gastrointestinal Nausea?: No Vomiting?: No Indigestion/heartburn?: No Diarrhea?: No Constipation?:  No  Constitutional Fever: No Night sweats?: No Weight loss?: No Fatigue?: No  Skin Skin rash/lesions?: No Itching?: No  Eyes Blurred vision?: No Double vision?: No.  Ears/Nose/Throat Sore throat?: No Sinus problems?: Yes  Hematologic/Lymphatic Swollen glands?: No Easy bruising?: No  Cardiovascular Leg swelling?: No Chest pain?: No  Respiratory Cough?: Yes Shortness of breath?: No  Endocrine Excessive thirst?: No  Musculoskeletal Back pain?: No Joint pain?: No  Neurological Headaches?: No Dizziness?: No  Psychologic Depression?: No Anxiety?: No  Physical Exam: BP 136/77   Pulse 83   Ht 5' 11.75" (1.822 m)  Wt 189 lb 12.8 oz (86.1 kg)   BMI 25.92 kg/m   Constitutional: Well nourished. Alert and oriented, No acute distress. HEENT: [place] AT, moist mucus membranes. Trachea midline, no masses. Cardiovascular: No clubbing, cyanosis, or edema. Respiratory: Normal respiratory effort, no increased work of breathing. GI: Abdomen is soft, non tender, non distended, no abdominal masses. Liver and spleen not palpable.  No hernias appreciated.  Stool sample for occult testing is not indicated.   GU: No CVA tenderness.  No bladder fullness or masses.   Skin: No rashes, bruises or suspicious lesions. Lymph: No cervical or inguinal adenopathy. Neurologic: Grossly intact, no focal deficits, moving all 4 extremities. Psychiatric: Normal mood and affect.  Laboratory Data: Lab Results  Component Value Date   WBC 6.5 03/17/2016   HGB 13.9 03/17/2016   HCT 41.2 03/17/2016   MCV 88.0 03/17/2016   PLT 260 03/17/2016    Lab Results  Component Value Date   CREATININE 1.34 (H) 03/17/2016    Urinalysis Negative  See EPIC.    I have reviewed the labs.  Assessment & Plan:    1. Recurrent high-risk non muscle invasive bladder cancer  - Reviewed BCG treatment course, possible side effects including BCG sepsis, bladder irritation, worsening of his urinary  symptoms  - # 3 of 3 BCG installed today  - Patient was instructed to pour bleach down his/her toilet for the next 6 hours  -  Instructed to call the office if she should experience fevers greater than 102, chills/rigors, onset of a new cough, night sweats or further bladder spasms or inability to urinate   - RTC on 04/09/2017 for surveillance cystoscopy  - Surveillance protocol also discussed today including cystoscopy every 3 months for at least 2 years and then spread out thereafter  - Urinalysis, Complete  - bcg vaccine injection 81 mg; Instill 3.24 mLs (81 mg total) into the bladder once.  - lidocaine (XYLOCAINE) 2 % jelly 1 application; Place 1 application into the urethra once.  2. Right distal ureteral cancer  - Previously not interested in distal ureterectomy/ nephrou  - S/p multiple biopsies/ laser ablation with stent/ BCG course  - In presence of bladder neck lesion, we will also re inspect right distal ureter in the OR.  Plan for URS/ RTG/ possible biopsy-  the right distal ureter was somewhat dilated but no suspicious lesions during 03/2016 procedure  3. History of nephrolithiasis  - Currently stone free.               4. Nocturia/ history of retention/ BPH with sensation of incomplete bladder emptying  - Continue Flomax  - CIC as needed  - PVR minimal   Return for Return on 04/09/2017 for surveillance cystoscopy.  These notes generated with voice recognition software. I apologize for typographical errors.  Zara Council, [facility] [address] [place], [place] [address] [phone]

## 2016-12-30 ENCOUNTER — Ambulatory Visit: Payer: PPO | Admitting: Urology

## 2016-12-30 ENCOUNTER — Encounter: Payer: Self-pay | Admitting: Urology

## 2016-12-30 VITALS — BP 136/77 | HR 83 | Ht 71.75 in | Wt 189.8 lb

## 2016-12-30 DIAGNOSIS — R351 Nocturia: Secondary | ICD-10-CM

## 2016-12-30 DIAGNOSIS — Z87442 Personal history of urinary calculi: Secondary | ICD-10-CM | POA: Diagnosis not present

## 2016-12-30 DIAGNOSIS — C679 Malignant neoplasm of bladder, unspecified: Secondary | ICD-10-CM

## 2016-12-30 DIAGNOSIS — C661 Malignant neoplasm of right ureter: Secondary | ICD-10-CM | POA: Diagnosis not present

## 2016-12-30 LAB — URINALYSIS, COMPLETE
Bilirubin, UA: NEGATIVE
GLUCOSE, UA: NEGATIVE
Ketones, UA: NEGATIVE
Leukocytes, UA: NEGATIVE
NITRITE UA: NEGATIVE
Protein, UA: NEGATIVE
Specific Gravity, UA: 1.02 (ref 1.005–1.030)
UUROB: 0.2 mg/dL (ref 0.2–1.0)
pH, UA: 6 (ref 5.0–7.5)

## 2016-12-30 LAB — MICROSCOPIC EXAMINATION
Bacteria, UA: NONE SEEN
EPITHELIAL CELLS (NON RENAL): NONE SEEN /HPF (ref 0–10)

## 2016-12-30 MED ORDER — BCG LIVE 50 MG IS SUSR
3.2400 mL | Freq: Once | INTRAVESICAL | Status: AC
  Administered 2016-12-30: 81 mg via INTRAVESICAL

## 2016-12-30 MED ORDER — LIDOCAINE HCL 2 % EX GEL
1.0000 "application " | Freq: Once | CUTANEOUS | Status: AC
  Administered 2016-12-30: 1 via URETHRAL

## 2016-12-30 NOTE — Progress Notes (Signed)
BCG Bladder Instillation  BCG # 3 of 3  Due to Bladder Cancer patient is present today for a BCG treatment. Patient was cleaned and prepped in a sterile fashion with betadine and lidocaine 2% jelly was instilled into the urethra.  A 14FR Coude catheter was inserted, urine return was noted 80ml, urine was yellow in color.  60ml of reconstituted BCG was instilled into the bladder. The catheter was then removed. Patient tolerated well, no complications were noted.  Preformed by: Zara Council PA-C and Lyndee Hensen CMA  Follow up/ Additional notes: 3 months for cystoscopy

## 2017-01-27 DIAGNOSIS — K5900 Constipation, unspecified: Secondary | ICD-10-CM | POA: Diagnosis not present

## 2017-01-27 DIAGNOSIS — J019 Acute sinusitis, unspecified: Secondary | ICD-10-CM | POA: Diagnosis not present

## 2017-03-06 DIAGNOSIS — H353131 Nonexudative age-related macular degeneration, bilateral, early dry stage: Secondary | ICD-10-CM | POA: Diagnosis not present

## 2017-03-27 ENCOUNTER — Telehealth: Payer: Self-pay | Admitting: Radiology

## 2017-03-27 ENCOUNTER — Other Ambulatory Visit: Payer: Self-pay | Admitting: Radiology

## 2017-03-27 DIAGNOSIS — N401 Enlarged prostate with lower urinary tract symptoms: Secondary | ICD-10-CM

## 2017-03-27 DIAGNOSIS — R3914 Feeling of incomplete bladder emptying: Principal | ICD-10-CM

## 2017-03-27 MED ORDER — TAMSULOSIN HCL 0.4 MG PO CAPS: 0.4000 mg | ORAL_CAPSULE | Freq: Every day | ORAL | 3 refills | Status: DC

## 2017-03-27 NOTE — Telephone Encounter (Signed)
LMOM that tamsulosin refill was sent to pharmacy.

## 2017-04-09 ENCOUNTER — Other Ambulatory Visit: Payer: PPO

## 2017-04-17 ENCOUNTER — Ambulatory Visit: Payer: Medicare HMO | Admitting: Urology

## 2017-04-17 ENCOUNTER — Encounter: Payer: Self-pay | Admitting: Urology

## 2017-04-17 VITALS — BP 146/82 | HR 75 | Ht 71.5 in | Wt 197.6 lb

## 2017-04-17 DIAGNOSIS — C679 Malignant neoplasm of bladder, unspecified: Secondary | ICD-10-CM | POA: Diagnosis not present

## 2017-04-17 LAB — URINALYSIS, COMPLETE
Bilirubin, UA: NEGATIVE
Glucose, UA: NEGATIVE
Ketones, UA: NEGATIVE
Leukocytes, UA: NEGATIVE
NITRITE UA: NEGATIVE
Protein, UA: NEGATIVE
Specific Gravity, UA: 1.015 (ref 1.005–1.030)
Urobilinogen, Ur: 0.2 mg/dL (ref 0.2–1.0)
pH, UA: 6 (ref 5.0–7.5)

## 2017-04-17 LAB — MICROSCOPIC EXAMINATION
Bacteria, UA: NONE SEEN
EPITHELIAL CELLS (NON RENAL): NONE SEEN /HPF (ref 0–10)

## 2017-04-17 MED ORDER — CIPROFLOXACIN HCL 500 MG PO TABS
500.0000 mg | ORAL_TABLET | Freq: Once | ORAL | Status: AC
  Administered 2017-04-17: 500 mg via ORAL

## 2017-04-17 MED ORDER — LIDOCAINE HCL 2 % EX GEL
1.0000 | Freq: Once | CUTANEOUS | Status: AC
Start: 2017-04-17 — End: 2017-04-17
  Administered 2017-04-17: 1 via URETHRAL

## 2017-04-17 NOTE — Progress Notes (Signed)
   04/17/17  CC: No chief complaint on file.   HPI: The patient is an 84 year old gentleman presents today for a second surveillance cystoscopy after finishing his first of a tolat3 years course of maintenance BCG for repeat cystoscopy  Patient initially presented with asymptomatic incidental moderate right hydroureteronephrosis in 12/2014. He was taken to the OR on 01/30/2015 at which time right ureteroscopy revealed as suspicious area within the right distal ureter with a shaggy, irregular appearance but no obvious papillary or pedunculated tumor. Brush cytology was negative for any malignancy. Ureteral biopsy revealed some atypia favoring neoplasm but essentially was non-diagnostic. Interval follow-up renal ultrasound on 05/02/2015 did show complete resolution of his hydronephrosis. He was counseled to return for a second look but deferred this for ~6 months. He eventually return to the OR on 08/20/15 at which time the right distal ureteral was highly suspicious for papillary urothelial carcinoma was biopsied. 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 He returned to operating room on 09/12/2015 for a second look ureteroscopy/biopsy and TURBT. Given the depth of resection, mitomycin was not administered. Pathology showed no residual tumor in the bladder or the distal ureter. A Bard Optima stent was replaced at the time of the procedure. He completed an induction course of BCG completed on 11/30/2015 6 doses. This was performed with the stent in place. The stent was subsequently removed. Most recent renal ultrasound on 01/30/2016 shows no evidence of hydronephrosis bilaterally. Surveillance cystoscopy performed on 02/06/2016 noted possible recurrence at bladder neck- recommend TURBT with mitomycin. He was taken to the OR on 02/26/2016 for TURBT,  bilateral RTG's and right ureteroscopy. 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 No muscle in the specimen. Recommend return to OR for re-TUR with mitomycin per AUA/ NCCN guidelines due to risk up upstaging. Re-TUR occurred on 03/24/2016. Pathology noted no evidence of residual tumor. He eventually underwent induction BCG.  He finished his first round of maintenance BCG in December 30, 2016.  There were no vitals taken for this visit. NED. A&Ox3.   No respiratory distress   Abd soft, NT, ND Normal phallus with bilateral descended testicles  Cystoscopy Procedure Note  Patient identification was confirmed, informed consent was obtained, and patient was prepped using Betadine solution.  Lidocaine jelly was administered per urethral meatus.    Preoperative abx where received prior to procedure.     Pre-Procedure: - Inspection reveals a normal caliber ureteral meatus.  Procedure: The flexible cystoscope was introduced without difficulty - No urethral strictures/lesions are present. - Enlarged prostate  - Normal bladder neck - Bilateral ureteral orifices identified - Bladder mucosa  reveals no ulcers, tumors, or lesions - No bladder stones - No trabeculation  Retroflexion shows no intravesical lobe   Post-Procedure: - Patient tolerated the procedure well  Assessment/ Plan:  1. Recurrent high-risk non muscle invasive bladder cancer Negative cystoscopy. We'll plan for cystoscopy and cytology 3 months. Will need second round of maintenance BCG at that time with plan for 3 year total course  2. History of CIS of the distal right ureter We'll send urine for cytology  3. BPH with history of retention Continue Flomax with CIC as needed

## 2017-04-22 ENCOUNTER — Other Ambulatory Visit: Payer: Self-pay | Admitting: Urology

## 2017-07-17 ENCOUNTER — Ambulatory Visit: Payer: Medicare HMO | Admitting: Urology

## 2017-07-17 ENCOUNTER — Encounter: Payer: Self-pay | Admitting: Urology

## 2017-07-17 VITALS — BP 130/78 | HR 84 | Ht 71.0 in | Wt 195.0 lb

## 2017-07-17 DIAGNOSIS — C679 Malignant neoplasm of bladder, unspecified: Secondary | ICD-10-CM

## 2017-07-17 DIAGNOSIS — D099 Carcinoma in situ, unspecified: Secondary | ICD-10-CM

## 2017-07-17 LAB — URINALYSIS, COMPLETE
BILIRUBIN UA: NEGATIVE
GLUCOSE, UA: NEGATIVE
KETONES UA: NEGATIVE
Leukocytes, UA: NEGATIVE
Nitrite, UA: NEGATIVE
Protein, UA: NEGATIVE
RBC, UA: NEGATIVE
Specific Gravity, UA: 1.015 (ref 1.005–1.030)
UUROB: 0.2 mg/dL (ref 0.2–1.0)
pH, UA: 7 (ref 5.0–7.5)

## 2017-07-17 MED ORDER — CIPROFLOXACIN HCL 500 MG PO TABS
500.0000 mg | ORAL_TABLET | Freq: Once | ORAL | Status: AC
  Administered 2017-07-17: 500 mg via ORAL

## 2017-07-17 MED ORDER — LIDOCAINE HCL URETHRAL/MUCOSAL 2 % EX GEL
1.0000 "application " | Freq: Once | CUTANEOUS | Status: AC
  Administered 2017-07-17: 1 via URETHRAL

## 2017-07-17 NOTE — Progress Notes (Signed)
07/17/17  CC:  Chief Complaint  Patient presents with  . Cysto    HPI: The patient is an 84 year old gentleman who presents today for surveillance cystoscopy. He completed his first maintenance BCG in October 2018. He is due for his second maintenance BCG with the plan for 3 total years of maintenance BCG. He presents today for cystoscopy.  Background History: Patient initially presented with asymptomatic incidental moderate right hydroureteronephrosis in 12/2014. He was taken to the OR on 01/30/2015 at which time right ureteroscopy revealed a suspicious area within the right distal ureter with a shaggy, irregular appearance but no obvious papillary or pedunculated tumor. Brush cytology was negative for any malignancy. Ureteral biopsy revealed some atypia favoring neoplasm but essentially was non-diagnostic. Interval follow-up renal ultrasound on 05/02/2015 did show complete resolution of his hydronephrosis. He was counseled to return for a second look but deferred this for ~6 months. He eventually returned to the OR on 08/20/15 at which time the right distal ureter was highly suspicious for papillary urothelial carcinoma and was biopsied. 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 He returned to operating room on 09/12/2015 for a second look ureteroscopy/biopsy and TURBT. Given the depth of resection, mitomycin was not administered. Pathology showed no residual tumor in the bladder or the distal ureter. A Bard Optima stent was replaced at the time of the procedure. He completed an induction course of BCG completed on 11/30/2015 6 doses. This was performed with the stent in place. The stent was subsequently removed. Most recent renal ultrasound on 01/30/2016 shows no evidence of hydronephrosis bilaterally. Surveillance cystoscopy performed on  02/06/2016 noted possible recurrence at bladder neck- recommend TURBT with mitomycin. He was taken to the OR on 02/26/2016 for TURBT, bilateral RTG's and right ureteroscopy. 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 No muscle in the specimen. Recommend return to OR for re-TUR with mitomycin per AUA/ NCCN guidelines due to risk up upstaging. Re-TUR occurred on 03/24/2016. Pathology noted no evidence of residual tumor. He eventually underwent induction BCG.  He finished his first round of maintenance BCG on December 30, 2016.  There were no vitals taken for this visit. NED. A&Ox3.   No respiratory distress   Abd soft, NT, ND Normal phallus with bilateral descended testicles  Cystoscopy Procedure Note  Patient identification was confirmed, informed consent was obtained, and patient was prepped using Betadine solution.  Lidocaine jelly was administered per urethral meatus.    Preoperative abx where received prior to procedure.     Pre-Procedure: - Inspection reveals a normal caliber ureteral meatus.  Procedure: The flexible cystoscope was introduced without difficulty - No urethral strictures/lesions are present. - Enlarged prostate which is visually obstructive and long - Normal bladder neck - Bilateral ureteral orifices identified - Bladder mucosa  reveals no ulcers, tumors, or lesions - No bladder stones - No trabeculation  Retroflexion shows no intravesical lobe or tumors   Post-Procedure: - Patient tolerated the procedure well  Assessment/ Plan:  1. Recurrent high-risknonmuscle invasive bladder cancer Negative cystoscopy.Patient is due for his second round of maintenance BCG at this time with plan for a 3 years of maintenance therapy. We'll plan for cystoscopy and cytology 3 months after completing maintenance BCG.  2. History of CIS of the distal right ureter We'll send urine for cytology.  We will  also obtain a CT urogram at this time, since it has been about a year since his last upper tract imaging.  We will call him with the results of his cytology and CT.  3. BPH with history of retention Continue Flomax with CIC as needed

## 2017-07-22 ENCOUNTER — Other Ambulatory Visit: Payer: Self-pay | Admitting: Urology

## 2017-08-06 ENCOUNTER — Ambulatory Visit
Admission: RE | Admit: 2017-08-06 | Discharge: 2017-08-06 | Disposition: A | Discharge status: Home / Self Care | Payer: Medicare HMO | Source: Ambulatory Visit | Attending: Urology | Admitting: Urology

## 2017-08-06 DIAGNOSIS — K5730 Diverticulosis of large intestine without perforation or abscess without bleeding: Secondary | ICD-10-CM | POA: Diagnosis not present

## 2017-08-06 DIAGNOSIS — R9349 Abnormal radiologic findings on diagnostic imaging of other urinary organs: Secondary | ICD-10-CM | POA: Insufficient documentation

## 2017-08-06 DIAGNOSIS — K869 Disease of pancreas, unspecified: Secondary | ICD-10-CM | POA: Diagnosis not present

## 2017-08-06 DIAGNOSIS — D099 Carcinoma in situ, unspecified: Secondary | ICD-10-CM | POA: Diagnosis not present

## 2017-08-06 DIAGNOSIS — K8020 Calculus of gallbladder without cholecystitis without obstruction: Secondary | ICD-10-CM | POA: Diagnosis not present

## 2017-08-06 DIAGNOSIS — C679 Malignant neoplasm of bladder, unspecified: Secondary | ICD-10-CM | POA: Diagnosis not present

## 2017-08-06 DIAGNOSIS — I700 Atherosclerosis of aorta: Secondary | ICD-10-CM | POA: Insufficient documentation

## 2017-08-06 DIAGNOSIS — N1330 Unspecified hydronephrosis: Secondary | ICD-10-CM | POA: Insufficient documentation

## 2017-08-06 LAB — POCT I-STAT CREATININE: Creatinine, Ser: 1.3 mg/dL — ABNORMAL HIGH (ref 0.61–1.24)

## 2017-08-06 MED ORDER — IOPAMIDOL (ISOVUE-370) INJECTION 76%
100.0000 mL | Freq: Once | INTRAVENOUS | Status: AC | PRN
  Administered 2017-08-06: 100 mL via INTRAVENOUS

## 2017-08-11 ENCOUNTER — Telehealth: Payer: Self-pay | Admitting: Radiology

## 2017-08-11 NOTE — Telephone Encounter (Signed)
Appt made. Pt aware & voices understanding.

## 2017-08-11 NOTE — Telephone Encounter (Signed)
-----   Message from [name], MD sent at 08/11/2017 10:43 AM EDT ----- There is an abnormal finding on the CT scan that needs to be discussed in person with the patient.  Please arrange follow-up appointment to discuss at my next availible f/u.    [name], MD

## 2017-08-12 ENCOUNTER — Encounter: Payer: Self-pay | Admitting: Urology

## 2017-08-12 ENCOUNTER — Ambulatory Visit (INDEPENDENT_AMBULATORY_CARE_PROVIDER_SITE_OTHER): Payer: Medicare HMO | Admitting: Urology

## 2017-08-12 ENCOUNTER — Other Ambulatory Visit: Payer: Self-pay | Admitting: Radiology

## 2017-08-12 VITALS — BP 164/78 | HR 75 | Ht 72.0 in | Wt 194.0 lb

## 2017-08-12 DIAGNOSIS — C651 Malignant neoplasm of right renal pelvis: Secondary | ICD-10-CM | POA: Diagnosis not present

## 2017-08-12 DIAGNOSIS — Z01818 Encounter for other preprocedural examination: Secondary | ICD-10-CM

## 2017-08-12 DIAGNOSIS — Z8551 Personal history of malignant neoplasm of bladder: Secondary | ICD-10-CM | POA: Diagnosis not present

## 2017-08-12 LAB — URINALYSIS, COMPLETE
Bilirubin, UA: NEGATIVE
Glucose, UA: NEGATIVE
KETONES UA: NEGATIVE
LEUKOCYTES UA: NEGATIVE
NITRITE UA: NEGATIVE
PH UA: 6 (ref 5.0–7.5)
Protein, UA: NEGATIVE
RBC UA: NEGATIVE
SPEC GRAV UA: 1.02 (ref 1.005–1.030)
UUROB: 0.2 mg/dL (ref 0.2–1.0)

## 2017-08-12 NOTE — Progress Notes (Signed)
08/12/2017 4:24 PM   [name] 1931/09/15 [id number]  Referring provider: Idelle Crouch, MD [facility] [place], [place] [address]  Chief Complaint  Patient presents with  . Results    HPI: 84 year old male with personal history  of recurrent bladder cancer and right distal ureteral cancer who returns to the office today to discuss CT urogram as well as positive urine cytology.  He was previously followed by me and more recently Dr. Pilar Jarvis on surveillance.  As part of his surveillance protocol, he underwent CT urogram for further evaluation of his upper tract system which showed a significant filling defect in the right renal pelvis measuring 2.6 x 1.6 cm with diffuse thickening of the urothelium.  There is also mild right hydronephrosis and soft tissue prominence in the right distal ureter near the UVJ without an obvious filling defect at this level.   no lymphadenopathy present.  No abnormalities in the left kidney although there was relatively poor opacification along the course of the left ureter.  He initially presented in 12/2014 asymptomatic incidental moderate right hydroureteronephrosis in 12/2014. He was taken to the OR on 01/30/2015 at which time right ureteroscopy revealed a suspicious area within the right distal ureter with a shaggy, irregular appearance but no obvious papillary or pedunculated tumor. Brush cytology was negative for any malignancy. Ureteral biopsy revealed some atypia favoring neoplasm but essentially was non-diagnostic. Interval follow-up renal ultrasound on 05/02/2015 did show complete resolution of his hydronephrosis. He was counseled to return for a second look but deferred this for ~6 months. He eventually returned to the OR on 08/20/15 at which time the right distal ureter was highly suspicious for papillary urothelial carcinoma and was biopsied. 2 discrete erythematous velvety patches of the bladder concerning  for CIS. 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 He returned to operating room on 09/12/2015 for a second look ureteroscopy/biopsy and TURBT. Given the depth of resection, mitomycin was not administered. Pathology showed no residual tumor in the bladder or the distal ureter. A Bard Optima stent was replaced at the time of the procedure. He completed an induction course of BCG completed on 11/30/2015 6 doses. This was performed with the stent in place. The stent was subsequently removed. Most recent renal ultrasound on 01/30/2016 shows no evidence of hydronephrosis bilaterally. Surveillance cystoscopy performed on 02/06/2016 noted possible recurrence at bladder neck- recommend TURBT with mitomycin. He was taken to the OR on 02/26/2016 for TURBT, bilateral RTG's and right ureteroscopy. 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 No muscle in the specimen. Recommend return to OR for re-TUR with mitomycin per AUA/ NCCN guidelines due to risk up upstaging. Re-TUR occurred on 03/24/2016. Pathology noted no evidence of residual tumor. He eventually underwent induction BCG. He finished his first round of maintenance BCG on December 30, 2016.    Most recent surveillance cystoscopy on 07/17/2017 by Dr. Pilar Jarvis was negative for any bladder pathology.  Urine cytology was positive for dysplasia.    Overall, he reports that health is been quite good.  He is out working in the yard yesterday for 6 hours.  He is feeling better than he has in many years.  PMH: Past Medical History:  Diagnosis Date  . Arthritis   . Benign fibroma of prostate 08/23/2013  . BPH (benign prostatic hyperplasia)   . Calculus of kidney 08/23/2013  .  Cancer [facility])    bladder cancer and skin cancer  . Glaucoma    no drops in 3 mo pressure good, pt denies glaucoma, eye pressure has been measuring alright.    . History of kidney stones   . HLD (hyperlipidemia)   . HOH (hard of hearing)    Left Hearing Aid  . HTN (hypertension) 12/26/2014  . Hypertension   . Hyponatremia 12/26/2014  . Migraines    history of migraines when he was younger.  [name] Restless leg syndrome   . Sinus drainage   . UTI (lower urinary tract infection) 12/26/2014  . Vertigo     Surgical History: Past Surgical History:  Procedure Laterality Date  . COLONOSCOPY    . CYSTOSCOPY W/ RETROGRADES Right 01/30/2015   Procedure: CYSTOSCOPY WITH RETROGRADE PYELOGRAM;  Surgeon: [name], MD;  Location: [facility] ORS;  Service: Urology;  Laterality: Right;  . CYSTOSCOPY W/ RETROGRADES Bilateral 02/26/2016   Procedure: CYSTOSCOPY WITH RETROGRADE PYELOGRAM;  Surgeon: [name], MD;  Location: [facility];  Service: Urology;  Laterality: Bilateral;  . CYSTOSCOPY W/ URETERAL STENT PLACEMENT Right 08/20/2015   Procedure: CYSTOSCOPY WITH RETROGRADE PYELOGRAM/POSSIBLE URETERAL STENT PLACEMENT/BLADDER BIOPSY;  Surgeon: [name], MD;  Location: [facility];  Service: Urology;  Laterality: Right;  . CYSTOSCOPY W/ URETERAL STENT PLACEMENT Right 09/12/2015   Procedure: CYSTOSCOPY WITH STENT REPLACEMENT;  Surgeon: [name], MD;  Location: [facility];  Service: Urology;  Laterality: Right;  . CYSTOSCOPY WITH BIOPSY Right 09/12/2015   Procedure: CYSTOSCOPY WITH BLADDER AND URETERAL BIOPSY;  Surgeon: [name], MD;  Location: [facility];  Service: Urology;  Laterality: Right;  . CYSTOSCOPY WITH STENT PLACEMENT Right 01/30/2015   Procedure: CYSTOSCOPY WITH STENT PLACEMENT;  Surgeon: [name], MD;  Location: [facility];  Service: Urology;  Laterality: Right;  . EYE SURGERY Bilateral    Cataract Extraction with IOL  . HOLMIUM LASER APPLICATION N/A 9/32/6712   Procedure:  HOLMIUM LASER APPLICATION;  Surgeon: [name], MD;  Location: [facility];  Service: Urology;  Laterality: N/A;  . SPERMATOCELECTOMY    . TONSILLECTOMY    .  TRANSURETHRAL RESECTION OF BLADDER TUMOR N/A 02/26/2016   Procedure: TRANSURETHRAL RESECTION OF BLADDER TUMOR (TURBT);  Surgeon: [name], MD;  Location: [facility];  Service: Urology;  Laterality: N/A;  . TRANSURETHRAL RESECTION OF BLADDER TUMOR WITH MITOMYCIN-C N/A 09/12/2015   Procedure: TRANSURETHRAL RESECTION OF BLADDER TUMOR ;  Surgeon: [name], MD;  Location: [facility];  Service: Urology;  Laterality: N/A;  . TRANSURETHRAL RESECTION OF BLADDER TUMOR WITH MITOMYCIN-C N/A 03/24/2016   Procedure: TRANSURETHRAL RESECTION OF BLADDER TUMOR WITH MITOMYCIN-C  (SMALL);  Surgeon: [name], MD;  Location: [facility];  Service: Urology;  Laterality: N/A;  . URETEROSCOPY Right 01/30/2015   Procedure: URETEROSCOPY/ WITH BIOPSY AND CYTOLOGY BRUSHING;  Surgeon: [name], MD;  Location: [facility];  Service: Urology;  Laterality: Right;  . URETEROSCOPY Right 08/20/2015   Procedure: URETEROSCOPY;  Surgeon: [name], MD;  Location: [facility];  Service: Urology;  Laterality: Right;  . URETEROSCOPY Right 09/12/2015   Procedure: URETEROSCOPY;  Surgeon: [name], MD;  Location: [facility];  Service: Urology;  Laterality: Right;  . URETEROSCOPY Right 02/26/2016   Procedure: URETEROSCOPY;  Surgeon: [name], MD;  Location: [facility];  Service: Urology;  Laterality: Right;    Home Medications:  Allergies as of 08/12/2017      Reactions   Demerol [meperidine] Nausea And Vomiting   Lipitor [atorvastatin] Swelling   Sulfa Antibiotics  Nausea And Vomiting, Rash      Medication List        Accurate as of 08/12/17  4:24 PM. Always use your most recent med list.          acetaminophen 500 MG tablet Commonly known as:  TYLENOL Take 1,000 mg by mouth every 6 (six) hours as needed for moderate pain.   benazepril-hydrochlorthiazide 20-12.5 MG tablet Commonly known as:  LOTENSIN HCT Take 1 tablet by mouth daily. In am.   fenofibrate micronized 134 MG capsule Commonly known as:   LOFIBRA Take 134 mg by mouth daily before breakfast.   guaiFENesin-codeine 100-10 MG/5ML syrup Take by mouth.   multivitamin capsule Take by mouth.   multivitamin with minerals Tabs tablet Take 1 tablet by mouth daily.   POTASSIUM BICARBONATE PO Take by mouth.   psyllium 58.6 % packet Commonly known as:  METAMUCIL Take 1 packet by mouth daily.   tamsulosin 0.4 MG Caps capsule Commonly known as:  FLOMAX Take 1 capsule (0.4 mg total) by mouth daily.       Allergies:  Allergies  Allergen Reactions  . Demerol [Meperidine] Nausea And Vomiting  . Lipitor [Atorvastatin] Swelling  . Sulfa Antibiotics Nausea And Vomiting and Rash    Family History: Family History  Problem Relation Age of Onset  . Hypertension Mother   . Hypertension Father   . Prostate cancer Brother   . Kidney disease Neg Hx   . Kidney cancer Neg Hx   . Bladder Cancer Neg Hx     Social History:  reports that he has never smoked. He has never used smokeless tobacco. He reports that he does not drink alcohol or use drugs.  ROS: UROLOGY Frequent Urination?: No Hard to postpone urination?: No Burning/pain with urination?: No Get up at night to urinate?: No Leakage of urine?: No Urine stream starts and stops?: No Trouble starting stream?: No Do you have to strain to urinate?: No Blood in urine?: No Urinary tract infection?: No Sexually transmitted disease?: No Injury to kidneys or bladder?: No Painful intercourse?: No Weak stream?: No Erection problems?: No Penile pain?: No  Gastrointestinal Nausea?: No Vomiting?: No Indigestion/heartburn?: No Diarrhea?: No Constipation?: No  Constitutional Fever: No Night sweats?: No Weight loss?: No Fatigue?: No  Skin Skin rash/lesions?: No Itching?: No  Eyes Blurred vision?: No Double vision?: No  Ears/Nose/Throat Sore throat?: No Sinus problems?: No  Hematologic/Lymphatic Swollen glands?: No Easy bruising?: No  Cardiovascular Leg  swelling?: No Chest pain?: No  Respiratory Cough?: No Shortness of breath?: No  Endocrine Excessive thirst?: No  Musculoskeletal Back pain?: No Joint pain?: No  Neurological Headaches?: No Dizziness?: No  Psychologic Depression?: No Anxiety?: No  Physical Exam: BP (!) 164/78   Pulse 75   Ht 6' (1.829 m)   Wt 194 lb (88 kg)   BMI 26.31 kg/m   Constitutional:  Alert and oriented, No acute distress.  Accompanied by wife today. HEENT: [place] AT, moist mucus membranes.  Trachea midline, no masses. Cardiovascular: No clubbing, cyanosis, or edema. Respiratory: Normal respiratory effort, no increased work of breathing. GI: Abdomen is soft, nontender, nondistended, no abdominal masses GU: No CVA tenderness Skin: No rashes, bruises or suspicious lesions. Neurologic: Grossly intact, no focal deficits, moving all 4 extremities. Psychiatric: Normal mood and affect.  Laboratory Data: Lab Results  Component Value Date   WBC 6.5 03/17/2016   HGB 13.9 03/17/2016   HCT 41.2 03/17/2016   MCV 88.0 03/17/2016   PLT 260  03/17/2016    Lab Results  Component Value Date   CREATININE 1.30 (H) 08/06/2017    Urinalysis Pending  Pertinent Imaging: Results for orders placed during the hospital encounter of 08/06/17  CT HEMATURIA WORKUP   Narrative CLINICAL DATA:  84 year old male with history of bladder cancer status post surgical resection. Possible ureteral lesion. Follow-up study.  EXAM: CT ABDOMEN AND PELVIS WITHOUT AND WITH CONTRAST  TECHNIQUE: Multidetector CT imaging of the abdomen and pelvis was performed following the standard protocol before and following the bolus administration of intravenous contrast.  CONTRAST:  125mL ISOVUE-370 IOPAMIDOL (ISOVUE-370) INJECTION 76%  COMPARISON:  CT the abdomen and pelvis 12/28/2014.  FINDINGS: Lower chest: Unremarkable.  Hepatobiliary: No cystic or solid hepatic lesions. No intra or extrahepatic biliary ductal dilatation.  1.3 cm calcified gallstone lying dependently in the gallbladder. No findings to suggest an acute cholecystitis are noted at this time.  Pancreas: In the inferior aspect of the pancreatic head (axial image 47 of series 7) there is a 12 x 7 mm low-attenuation lesion. No other suspicious pancreatic lesions are noted. No pancreatic ductal dilatation. No peripancreatic fluid collections or inflammatory changes.  Spleen: Unremarkable.  Adrenals/Urinary Tract: 4.1 cm simple cyst in the lower pole of the right kidney. Subcentimeter low-attenuation lesion in the interpolar region of the left kidney, too small to characterize, but statistically likely a cyst. No suspicious renal lesions. However, there is a filling defect in the right renal pelvis measuring at least 2.6 x 1.6 cm (axial image 34 of series 13), in addition to rather diffuse thickening of the urothelium in the right renal pelvis, concerning for upper tract urothelial neoplasm. There is also mild right hydroureteronephrosis which terminates at the right ureterovesicular junction where there appears to be some prominent soft tissue best appreciated on axial image 88 of series 7. No additional filling defects are confidently identified elsewhere in the left renal collecting system (poorly opacified), along the course of the left ureter or within the lumen of the urinary bladder. 2 cm left posterolateral diverticulum from the urinary bladder incidentally noted. Bilateral adrenal glands are normal in appearance.  Stomach/Bowel: Normal appearance of the stomach. No pathologic dilatation of small bowel or colon. Numerous colonic diverticulae are noted, without surrounding inflammatory changes to suggest an acute diverticulitis at this time normal appendix.  Vascular/Lymphatic: Aortic atherosclerosis, without evidence of aneurysm or dissection in the abdominal or pelvic vasculature.  Reproductive: Prostate gland and seminal vesicles  are unremarkable in appearance.  Other: No significant volume of ascites.  No pneumoperitoneum.  Musculoskeletal: There are no aggressive appearing lytic or blastic lesions noted in the visualized portions of the skeleton.  IMPRESSION: 1. Today's study demonstrates abnormal soft tissue in the right renal collecting system, highly concerning for upper tract urothelial neoplasm. 2. In addition, there is soft tissue fullness at the right ureterovesicular junction, with mild proximal right hydroureteronephrosis. This may suggest an additional focus of disease. 3. Colonic diverticulosis without evidence of acute diverticulitis at this time. 4. Cholelithiasis without evidence of acute cholecystitis. 5. Aortic atherosclerosis. 6. 7 mm low-attenuation lesion in the inferior aspect of the pancreatic head. This is nonspecific. Follow-up evaluation with pancreatic protocol CT scan or abdominal MRI with and without IV gadolinium with MRCP in 1 year is recommended to ensure the stability of this finding. This recommendation follows ACR consensus guidelines: Management of Incidental Pancreatic Cysts: A White Paper of the ACR Incidental Findings Committee. J Am Coll Radiol 4854;62:703-500. 7. Additional incidental findings, as above.  Aortic Atherosclerosis (ICD10-I70.0).   Electronically Signed   By: Vinnie Langton M.D.   On: 08/06/2017 15:51    CT urogram imaging reviewed in detail personally today.  Agree with above interpretation.  Assessment & Plan:    1. 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  We discussed that first step would be for right ureteroscopy with biopsy and possible ablation of the tumor as possible based on the size, ureteral stent.  This will provide both the tissue diagnosis as  well as assessment of whether or not it would be amenable to local therapy.  Based on his history, I suspect this will be a high-grade lesion thus nephro ureterectomy may be his best option moving forward.  Both he and his wife are hesitant to proceed with this in the future.  Risks of the above procedure were discussed at length including risk of bleeding, infection, damage surrounding structures, ureteral injury, need for ureteral stent, and need for further procedures.  All questions were answered today.  2. History of bladder cancer Cystoscopy at the time of above surgery  We will biopsy if there is any suspicious lesions  3. Preop testing Preop UA/urine culture today - Urinalysis, Complete - CULTURE, URINE COMPREHENSIVE    [name], MD  [facility] [address] [place], [place] [address] [phone]  I spent 25 min with this patient of which greater than 50% was spent in counseling and coordination of care with the patient.

## 2017-08-12 NOTE — H&P (View-Only) (Signed)
08/12/2017 4:24 PM   [name] 1931/10/02 [id number]  Referring provider: Idelle Crouch, MD [place] [facility] [place], [place] [address]  Chief Complaint  Patient presents with  . Results    HPI: 84 year old male with personal history  of recurrent bladder cancer and right distal ureteral cancer who returns to the office today to discuss CT urogram as well as positive urine cytology.  He was previously followed by me and more recently Dr. Pilar Jarvis on surveillance.  As part of his surveillance protocol, he underwent CT urogram for further evaluation of his upper tract system which showed a significant filling defect in the right renal pelvis measuring 2.6 x 1.6 cm with diffuse thickening of the urothelium.  There is also mild right hydronephrosis and soft tissue prominence in the right distal ureter near the UVJ without an obvious filling defect at this level.   no lymphadenopathy present.  No abnormalities in the left kidney although there was relatively poor opacification along the course of the left ureter.  He initially presented in 12/2014 asymptomatic incidental moderate right hydroureteronephrosis in 12/2014. He was taken to the OR on 01/30/2015 at which time right ureteroscopy revealed a suspicious area within the right distal ureter with a shaggy, irregular appearance but no obvious papillary or pedunculated tumor. Brush cytology was negative for any malignancy. Ureteral biopsy revealed some atypia favoring neoplasm but essentially was non-diagnostic. Interval follow-up renal ultrasound on 05/02/2015 did show complete resolution of his hydronephrosis. He was counseled to return for a second look but deferred this for ~6 months. He eventually returned to the OR on 08/20/15 at which time the right distal ureter was highly suspicious for papillary urothelial carcinoma and was biopsied. 2 discrete erythematous velvety patches of the bladder concerning  for CIS. 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 He returned to operating room on 09/12/2015 for a second look ureteroscopy/biopsy and TURBT. Given the depth of resection, mitomycin was not administered. Pathology showed no residual tumor in the bladder or the distal ureter. A Bard Optima stent was replaced at the time of the procedure. He completed an induction course of BCG completed on 11/30/2015 6 doses. This was performed with the stent in place. The stent was subsequently removed. Most recent renal ultrasound on 01/30/2016 shows no evidence of hydronephrosis bilaterally. Surveillance cystoscopy performed on 02/06/2016 noted possible recurrence at bladder neck- recommend TURBT with mitomycin. He was taken to the OR on 02/26/2016 for TURBT, bilateral RTG's and right ureteroscopy. 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 No muscle in the specimen. Recommend return to OR for re-TUR with mitomycin per AUA/ NCCN guidelines due to risk up upstaging. Re-TUR occurred on 03/24/2016. Pathology noted no evidence of residual tumor. He eventually underwent induction BCG. He finished his first round of maintenance BCG on December 30, 2016.    Most recent surveillance cystoscopy on 07/17/2017 by Dr. Pilar Jarvis was negative for any bladder pathology.  Urine cytology was positive for dysplasia.    Overall, he reports that health is been quite good.  He is out working in the yard yesterday for 6 hours.  He is feeling better than he has in many years.  PMH: Past Medical History:  Diagnosis Date  . Arthritis   . Benign fibroma of prostate 08/23/2013  . BPH (benign prostatic hyperplasia)   . Calculus of kidney 08/23/2013  .  Cancer [facility])    bladder cancer and skin cancer  . Glaucoma    no drops in 3 mo pressure good, pt denies glaucoma, eye pressure has been measuring alright.    . History of kidney stones   . HLD (hyperlipidemia)   . HOH (hard of hearing)    Left Hearing Aid  . HTN (hypertension) 12/26/2014  . Hypertension   . Hyponatremia 12/26/2014  . Migraines    history of migraines when he was younger.  [name] Restless leg syndrome   . Sinus drainage   . UTI (lower urinary tract infection) 12/26/2014  . Vertigo     Surgical History: Past Surgical History:  Procedure Laterality Date  . COLONOSCOPY    . CYSTOSCOPY W/ RETROGRADES Right 01/30/2015   Procedure: CYSTOSCOPY WITH RETROGRADE PYELOGRAM;  Surgeon: [name], MD;  Location: [facility];  Service: Urology;  Laterality: Right;  . CYSTOSCOPY W/ RETROGRADES Bilateral 02/26/2016   Procedure: CYSTOSCOPY WITH RETROGRADE PYELOGRAM;  Surgeon: [name], MD;  Location: [facility];  Service: Urology;  Laterality: Bilateral;  . CYSTOSCOPY W/ URETERAL STENT PLACEMENT Right 08/20/2015   Procedure: CYSTOSCOPY WITH RETROGRADE PYELOGRAM/POSSIBLE URETERAL STENT PLACEMENT/BLADDER BIOPSY;  Surgeon: [name], MD;  Location: [facility];  Service: Urology;  Laterality: Right;  . CYSTOSCOPY W/ URETERAL STENT PLACEMENT Right 09/12/2015   Procedure: CYSTOSCOPY WITH STENT REPLACEMENT;  Surgeon: [name], MD;  Location: [facility];  Service: Urology;  Laterality: Right;  . CYSTOSCOPY WITH BIOPSY Right 09/12/2015   Procedure: CYSTOSCOPY WITH BLADDER AND URETERAL BIOPSY;  Surgeon: [name], MD;  Location: [facility];  Service: Urology;  Laterality: Right;  . CYSTOSCOPY WITH STENT PLACEMENT Right 01/30/2015   Procedure: CYSTOSCOPY WITH STENT PLACEMENT;  Surgeon: [name], MD;  Location: [facility];  Service: Urology;  Laterality: Right;  . EYE SURGERY Bilateral    Cataract Extraction with IOL  . HOLMIUM LASER APPLICATION N/A 7/37/1062   Procedure:  HOLMIUM LASER APPLICATION;  Surgeon: [name], MD;  Location: [facility];  Service: Urology;  Laterality: N/A;  . SPERMATOCELECTOMY    . TONSILLECTOMY    .  TRANSURETHRAL RESECTION OF BLADDER TUMOR N/A 02/26/2016   Procedure: TRANSURETHRAL RESECTION OF BLADDER TUMOR (TURBT);  Surgeon: [name], MD;  Location: [facility];  Service: Urology;  Laterality: N/A;  . TRANSURETHRAL RESECTION OF BLADDER TUMOR WITH MITOMYCIN-C N/A 09/12/2015   Procedure: TRANSURETHRAL RESECTION OF BLADDER TUMOR ;  Surgeon: [name], MD;  Location: [facility];  Service: Urology;  Laterality: N/A;  . TRANSURETHRAL RESECTION OF BLADDER TUMOR WITH MITOMYCIN-C N/A 03/24/2016   Procedure: TRANSURETHRAL RESECTION OF BLADDER TUMOR WITH MITOMYCIN-C  (SMALL);  Surgeon: [name], MD;  Location: [facility];  Service: Urology;  Laterality: N/A;  . URETEROSCOPY Right 01/30/2015   Procedure: URETEROSCOPY/ WITH BIOPSY AND CYTOLOGY BRUSHING;  Surgeon: [name], MD;  Location: [facility];  Service: Urology;  Laterality: Right;  . URETEROSCOPY Right 08/20/2015   Procedure: URETEROSCOPY;  Surgeon: [name], MD;  Location: [facility];  Service: Urology;  Laterality: Right;  . URETEROSCOPY Right 09/12/2015   Procedure: URETEROSCOPY;  Surgeon: [name], MD;  Location: [facility];  Service: Urology;  Laterality: Right;  . URETEROSCOPY Right 02/26/2016   Procedure: URETEROSCOPY;  Surgeon: [name], MD;  Location: [facility];  Service: Urology;  Laterality: Right;    Home Medications:  Allergies as of 08/12/2017      Reactions   Demerol [meperidine] Nausea And Vomiting   Lipitor [atorvastatin] Swelling   Sulfa Antibiotics  Nausea And Vomiting, Rash      Medication List        Accurate as of 08/12/17  4:24 PM. Always use your most recent med list.          acetaminophen 500 MG tablet Commonly known as:  TYLENOL Take 1,000 mg by mouth every 6 (six) hours as needed for moderate pain.   benazepril-hydrochlorthiazide 20-12.5 MG tablet Commonly known as:  LOTENSIN HCT Take 1 tablet by mouth daily. In am.   fenofibrate micronized 134 MG capsule Commonly known as:   LOFIBRA Take 134 mg by mouth daily before breakfast.   guaiFENesin-codeine 100-10 MG/5ML syrup Take by mouth.   multivitamin capsule Take by mouth.   multivitamin with minerals Tabs tablet Take 1 tablet by mouth daily.   POTASSIUM BICARBONATE PO Take by mouth.   psyllium 58.6 % packet Commonly known as:  METAMUCIL Take 1 packet by mouth daily.   tamsulosin 0.4 MG Caps capsule Commonly known as:  FLOMAX Take 1 capsule (0.4 mg total) by mouth daily.       Allergies:  Allergies  Allergen Reactions  . Demerol [Meperidine] Nausea And Vomiting  . Lipitor [Atorvastatin] Swelling  . Sulfa Antibiotics Nausea And Vomiting and Rash    Family History: Family History  Problem Relation Age of Onset  . Hypertension Mother   . Hypertension Father   . Prostate cancer Brother   . Kidney disease Neg Hx   . Kidney cancer Neg Hx   . Bladder Cancer Neg Hx     Social History:  reports that he has never smoked. He has never used smokeless tobacco. He reports that he does not drink alcohol or use drugs.  ROS: UROLOGY Frequent Urination?: No Hard to postpone urination?: No Burning/pain with urination?: No Get up at night to urinate?: No Leakage of urine?: No Urine stream starts and stops?: No Trouble starting stream?: No Do you have to strain to urinate?: No Blood in urine?: No Urinary tract infection?: No Sexually transmitted disease?: No Injury to kidneys or bladder?: No Painful intercourse?: No Weak stream?: No Erection problems?: No Penile pain?: No  Gastrointestinal Nausea?: No Vomiting?: No Indigestion/heartburn?: No Diarrhea?: No Constipation?: No  Constitutional Fever: No Night sweats?: No Weight loss?: No Fatigue?: No  Skin Skin rash/lesions?: No Itching?: No  Eyes Blurred vision?: No Double vision?: No  Ears/Nose/Throat Sore throat?: No Sinus problems?: No  Hematologic/Lymphatic Swollen glands?: No Easy bruising?: No  Cardiovascular Leg  swelling?: No Chest pain?: No  Respiratory Cough?: No Shortness of breath?: No  Endocrine Excessive thirst?: No  Musculoskeletal Back pain?: No Joint pain?: No  Neurological Headaches?: No Dizziness?: No  Psychologic Depression?: No Anxiety?: No  Physical Exam: BP (!) 164/78   Pulse 75   Ht 6' (1.829 m)   Wt 194 lb (88 kg)   BMI 26.31 kg/m   Constitutional:  Alert and oriented, No acute distress.  Accompanied by wife today. HEENT: [place] AT, moist mucus membranes.  Trachea midline, no masses. Cardiovascular: No clubbing, cyanosis, or edema. Respiratory: Normal respiratory effort, no increased work of breathing. GI: Abdomen is soft, nontender, nondistended, no abdominal masses GU: No CVA tenderness Skin: No rashes, bruises or suspicious lesions. Neurologic: Grossly intact, no focal deficits, moving all 4 extremities. Psychiatric: Normal mood and affect.  Laboratory Data: Lab Results  Component Value Date   WBC 6.5 03/17/2016   HGB 13.9 03/17/2016   HCT 41.2 03/17/2016   MCV 88.0 03/17/2016   PLT 260  03/17/2016    Lab Results  Component Value Date   CREATININE 1.30 (H) 08/06/2017    Urinalysis Pending  Pertinent Imaging: Results for orders placed during the hospital encounter of 08/06/17  CT HEMATURIA WORKUP   Narrative CLINICAL DATA:  85 year old male with history of bladder cancer status post surgical resection. Possible ureteral lesion. Follow-up study.  EXAM: CT ABDOMEN AND PELVIS WITHOUT AND WITH CONTRAST  TECHNIQUE: Multidetector CT imaging of the abdomen and pelvis was performed following the standard protocol before and following the bolus administration of intravenous contrast.  CONTRAST:  142mL ISOVUE-370 IOPAMIDOL (ISOVUE-370) INJECTION 76%  COMPARISON:  CT the abdomen and pelvis 12/28/2014.  FINDINGS: Lower chest: Unremarkable.  Hepatobiliary: No cystic or solid hepatic lesions. No intra or extrahepatic biliary ductal dilatation.  1.3 cm calcified gallstone lying dependently in the gallbladder. No findings to suggest an acute cholecystitis are noted at this time.  Pancreas: In the inferior aspect of the pancreatic head (axial image 47 of series 7) there is a 12 x 7 mm low-attenuation lesion. No other suspicious pancreatic lesions are noted. No pancreatic ductal dilatation. No peripancreatic fluid collections or inflammatory changes.  Spleen: Unremarkable.  Adrenals/Urinary Tract: 4.1 cm simple cyst in the lower pole of the right kidney. Subcentimeter low-attenuation lesion in the interpolar region of the left kidney, too small to characterize, but statistically likely a cyst. No suspicious renal lesions. However, there is a filling defect in the right renal pelvis measuring at least 2.6 x 1.6 cm (axial image 34 of series 13), in addition to rather diffuse thickening of the urothelium in the right renal pelvis, concerning for upper tract urothelial neoplasm. There is also mild right hydroureteronephrosis which terminates at the right ureterovesicular junction where there appears to be some prominent soft tissue best appreciated on axial image 88 of series 7. No additional filling defects are confidently identified elsewhere in the left renal collecting system (poorly opacified), along the course of the left ureter or within the lumen of the urinary bladder. 2 cm left posterolateral diverticulum from the urinary bladder incidentally noted. Bilateral adrenal glands are normal in appearance.  Stomach/Bowel: Normal appearance of the stomach. No pathologic dilatation of small bowel or colon. Numerous colonic diverticulae are noted, without surrounding inflammatory changes to suggest an acute diverticulitis at this time normal appendix.  Vascular/Lymphatic: Aortic atherosclerosis, without evidence of aneurysm or dissection in the abdominal or pelvic vasculature.  Reproductive: Prostate gland and seminal vesicles  are unremarkable in appearance.  Other: No significant volume of ascites.  No pneumoperitoneum.  Musculoskeletal: There are no aggressive appearing lytic or blastic lesions noted in the visualized portions of the skeleton.  IMPRESSION: 1. Today's study demonstrates abnormal soft tissue in the right renal collecting system, highly concerning for upper tract urothelial neoplasm. 2. In addition, there is soft tissue fullness at the right ureterovesicular junction, with mild proximal right hydroureteronephrosis. This may suggest an additional focus of disease. 3. Colonic diverticulosis without evidence of acute diverticulitis at this time. 4. Cholelithiasis without evidence of acute cholecystitis. 5. Aortic atherosclerosis. 6. 7 mm low-attenuation lesion in the inferior aspect of the pancreatic head. This is nonspecific. Follow-up evaluation with pancreatic protocol CT scan or abdominal MRI with and without IV gadolinium with MRCP in 1 year is recommended to ensure the stability of this finding. This recommendation follows ACR consensus guidelines: Management of Incidental Pancreatic Cysts: A White Paper of the ACR Incidental Findings Committee. J Am Coll Radiol 1017;51:025-852. 7. Additional incidental findings, as above.  Aortic Atherosclerosis (ICD10-I70.0).   Electronically Signed   By: Vinnie Langton M.D.   On: 08/06/2017 15:51    CT urogram imaging reviewed in detail personally today.  Agree with above interpretation.  Assessment & Plan:    1. 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  We discussed that first step would be for right ureteroscopy with biopsy and possible ablation of the tumor as possible based on the size, ureteral stent.  This will provide both the tissue diagnosis as  well as assessment of whether or not it would be amenable to local therapy.  Based on his history, I suspect this will be a high-grade lesion thus nephro ureterectomy may be his best option moving forward.  Both he and his wife are hesitant to proceed with this in the future.  Risks of the above procedure were discussed at length including risk of bleeding, infection, damage surrounding structures, ureteral injury, need for ureteral stent, and need for further procedures.  All questions were answered today.  2. History of bladder cancer Cystoscopy at the time of above surgery  We will biopsy if there is any suspicious lesions  3. Preop testing Preop UA/urine culture today - Urinalysis, Complete - CULTURE, URINE COMPREHENSIVE    [name], MD  [facility] [address], [place], [place] [address] [phone]  I spent 25 min with this patient of which greater than 50% was spent in counseling and coordination of care with the patient.

## 2017-08-15 LAB — CULTURE, URINE COMPREHENSIVE

## 2017-08-17 ENCOUNTER — Encounter
Admission: RE | Admit: 2017-08-17 | Discharge: 2017-08-17 | Disposition: A | Discharge status: Home / Self Care | Payer: Medicare HMO | Source: Ambulatory Visit | Attending: Urology | Admitting: Urology

## 2017-08-17 ENCOUNTER — Other Ambulatory Visit: Payer: Self-pay

## 2017-08-17 DIAGNOSIS — Z885 Allergy status to narcotic agent status: Secondary | ICD-10-CM | POA: Diagnosis not present

## 2017-08-17 DIAGNOSIS — E785 Hyperlipidemia, unspecified: Secondary | ICD-10-CM | POA: Diagnosis not present

## 2017-08-17 DIAGNOSIS — G2581 Restless legs syndrome: Secondary | ICD-10-CM | POA: Diagnosis not present

## 2017-08-17 DIAGNOSIS — Z8551 Personal history of malignant neoplasm of bladder: Secondary | ICD-10-CM | POA: Diagnosis not present

## 2017-08-17 DIAGNOSIS — Z85828 Personal history of other malignant neoplasm of skin: Secondary | ICD-10-CM | POA: Diagnosis not present

## 2017-08-17 DIAGNOSIS — Z888 Allergy status to other drugs, medicaments and biological substances status: Secondary | ICD-10-CM | POA: Diagnosis not present

## 2017-08-17 DIAGNOSIS — C651 Malignant neoplasm of right renal pelvis: Secondary | ICD-10-CM | POA: Diagnosis not present

## 2017-08-17 DIAGNOSIS — C661 Malignant neoplasm of right ureter: Secondary | ICD-10-CM | POA: Diagnosis not present

## 2017-08-17 DIAGNOSIS — N1330 Unspecified hydronephrosis: Secondary | ICD-10-CM | POA: Diagnosis not present

## 2017-08-17 DIAGNOSIS — I10 Essential (primary) hypertension: Secondary | ICD-10-CM | POA: Diagnosis not present

## 2017-08-17 DIAGNOSIS — M1990 Unspecified osteoarthritis, unspecified site: Secondary | ICD-10-CM | POA: Diagnosis not present

## 2017-08-17 DIAGNOSIS — Z882 Allergy status to sulfonamides status: Secondary | ICD-10-CM | POA: Diagnosis not present

## 2017-08-17 NOTE — Patient Instructions (Signed)
Your procedure is scheduled on: Wed. 6/12 Report to Day Surgery. To find out your arrival time please call [phone] between 1PM - 3PM on Tues 6/11.  Remember: Instructions that are not followed completely may result in serious medical risk, up to and including death, or upon the discretion of your surgeon and anesthesiologist your surgery may need to be rescheduled.     _X__ 1. Do not eat food after midnight the night before your procedure.                 No gum chewing or hard candies. You may drink clear liquids up to 2 hours                 before you are scheduled to arrive for your surgery- DO not drink clear                 liquids within 2 hours of the start of your surgery.                 Clear Liquids include:  water, apple juice without pulp, clear carbohydrate                 drink such as Clearfast of Gartorade, Black Coffee or Tea (Do not add                 anything to coffee or tea).  __X__2.  On the morning of surgery brush your teeth with toothpaste and water, you  may rinse your mouth with mouthwash if you wish.  Do not swallow any              toothpaste of mouthwash.     ___ 3.  No Alcohol for 24 hours before or after surgery.   ___ 4.  Do Not Smoke or use e-cigarettes For 24 Hours Prior to Your Surgery.                 Do not use any chewable tobacco products for at least 6 hours prior to                 surgery.  ____  5.  Bring all medications with you on the day of surgery if instructed.   __x__  6.  Notify your doctor if there is any change in your medical condition      (cold, fever, infections).     Do not wear jewelry, make-up, hairpins, clips or nail polish. Do not wear lotions, powders, or perfumes. You may wear deodorant. Do not shave 48 hours prior to surgery. Men may shave face and neck. Do not bring valuables to the hospital.    [facility] is not responsible for any belongings or valuables.  Contacts, dentures or  bridgework may not be worn into surgery. Leave your suitcase in the car. After surgery it may be brought to your room. For patients admitted to the hospital, discharge time is determined by your treatment team.   Patients discharged the day of surgery will not be allowed to drive home.   Please read over the following fact sheets that you were given:    _x___ Take these medicines the morning of surgery with A SIP OF WATER:    1. diazepam (VALIUM) 2 MG tablet  2.   3.   4.  5.  6.  ____ Fleet Enema (as directed)   ____ Use CHG Soap as directed  ____ Use inhalers on the day  of surgery  ____ Stop metformin 2 days prior to surgery    ____ Take 1/2 of usual insulin dose the night before surgery. No insulin the morning          of surgery.   ____ Stop Coumadin/Plavix/aspirin on   ____ Stop Anti-inflammatories on    ____ Stop supplements until after surgery.    ____ Bring C-Pap to the hospital.

## 2017-08-18 MED ORDER — CEFAZOLIN SODIUM-DEXTROSE 2-4 GM/100ML-% IV SOLN
2.0000 g | INTRAVENOUS | Status: AC
  Administered 2017-08-19: 2 g via INTRAVENOUS

## 2017-08-19 ENCOUNTER — Other Ambulatory Visit: Payer: Self-pay

## 2017-08-19 ENCOUNTER — Ambulatory Visit: Payer: Medicare HMO | Admitting: Certified Registered"

## 2017-08-19 ENCOUNTER — Ambulatory Visit
Admission: RE | Admit: 2017-08-19 | Discharge: 2017-08-19 | Disposition: A | Discharge status: Home / Self Care | Payer: Medicare HMO | Source: Ambulatory Visit | Attending: Urology | Admitting: Urology

## 2017-08-19 ENCOUNTER — Encounter
Admission: RE | Disposition: A | Discharge status: Home / Self Care | Payer: Self-pay | Source: Ambulatory Visit | Attending: Urology

## 2017-08-19 ENCOUNTER — Encounter: Payer: Self-pay | Admitting: *Deleted

## 2017-08-19 DIAGNOSIS — Z888 Allergy status to other drugs, medicaments and biological substances status: Secondary | ICD-10-CM | POA: Insufficient documentation

## 2017-08-19 DIAGNOSIS — Z885 Allergy status to narcotic agent status: Secondary | ICD-10-CM | POA: Insufficient documentation

## 2017-08-19 DIAGNOSIS — N1330 Unspecified hydronephrosis: Secondary | ICD-10-CM | POA: Insufficient documentation

## 2017-08-19 DIAGNOSIS — Z85828 Personal history of other malignant neoplasm of skin: Secondary | ICD-10-CM | POA: Insufficient documentation

## 2017-08-19 DIAGNOSIS — Z8551 Personal history of malignant neoplasm of bladder: Secondary | ICD-10-CM

## 2017-08-19 DIAGNOSIS — I10 Essential (primary) hypertension: Secondary | ICD-10-CM | POA: Insufficient documentation

## 2017-08-19 DIAGNOSIS — E785 Hyperlipidemia, unspecified: Secondary | ICD-10-CM | POA: Insufficient documentation

## 2017-08-19 DIAGNOSIS — G2581 Restless legs syndrome: Secondary | ICD-10-CM | POA: Insufficient documentation

## 2017-08-19 DIAGNOSIS — C661 Malignant neoplasm of right ureter: Secondary | ICD-10-CM | POA: Insufficient documentation

## 2017-08-19 DIAGNOSIS — Z8554 Personal history of malignant neoplasm of ureter: Secondary | ICD-10-CM

## 2017-08-19 DIAGNOSIS — M1990 Unspecified osteoarthritis, unspecified site: Secondary | ICD-10-CM | POA: Insufficient documentation

## 2017-08-19 DIAGNOSIS — Z882 Allergy status to sulfonamides status: Secondary | ICD-10-CM | POA: Insufficient documentation

## 2017-08-19 DIAGNOSIS — D4111 Neoplasm of uncertain behavior of right renal pelvis: Secondary | ICD-10-CM

## 2017-08-19 DIAGNOSIS — C651 Malignant neoplasm of right renal pelvis: Secondary | ICD-10-CM

## 2017-08-19 HISTORY — PX: CYSTOSCOPY/URETEROSCOPY/HOLMIUM LASER/STENT PLACEMENT: SHX6546

## 2017-08-19 HISTORY — PX: URETERAL BIOPSY: SHX6688

## 2017-08-19 SURGERY — CYSTOSCOPY/URETEROSCOPY/HOLMIUM LASER/STENT PLACEMENT
Anesthesia: General | Laterality: Right

## 2017-08-19 MED ORDER — SUCCINYLCHOLINE CHLORIDE 20 MG/ML IJ SOLN
INTRAMUSCULAR | Status: DC | PRN
  Administered 2017-08-19: 100 mg via INTRAVENOUS

## 2017-08-19 MED ORDER — SEVOFLURANE IN SOLN
RESPIRATORY_TRACT | Status: AC
  Filled 2017-08-19: qty 250

## 2017-08-19 MED ORDER — FENTANYL CITRATE (PF) 100 MCG/2ML IJ SOLN
INTRAMUSCULAR | Status: DC | PRN
  Administered 2017-08-19 (×2): 50 ug via INTRAVENOUS

## 2017-08-19 MED ORDER — LIDOCAINE HCL (PF) 2 % IJ SOLN
INTRAMUSCULAR | Status: AC
  Filled 2017-08-19: qty 10

## 2017-08-19 MED ORDER — FENTANYL CITRATE (PF) 100 MCG/2ML IJ SOLN
INTRAMUSCULAR | Status: AC
  Filled 2017-08-19: qty 2

## 2017-08-19 MED ORDER — DOCUSATE SODIUM 100 MG PO CAPS: 100.0000 mg | ORAL_CAPSULE | Freq: Two times a day (BID) | ORAL | 0 refills | Status: DC

## 2017-08-19 MED ORDER — LIDOCAINE HCL (CARDIAC) PF 100 MG/5ML IV SOSY
PREFILLED_SYRINGE | INTRAVENOUS | Status: DC | PRN
  Administered 2017-08-19: 60 mg via INTRAVENOUS

## 2017-08-19 MED ORDER — ROCURONIUM BROMIDE 50 MG/5ML IV SOLN
INTRAVENOUS | Status: AC
  Filled 2017-08-19: qty 1

## 2017-08-19 MED ORDER — LACTATED RINGERS IV SOLN
INTRAVENOUS | Status: DC
  Administered 2017-08-19 (×2): via INTRAVENOUS

## 2017-08-19 MED ORDER — TAMSULOSIN HCL 0.4 MG PO CAPS: 0.4000 mg | ORAL_CAPSULE | Freq: Every day | ORAL | 0 refills | Status: DC

## 2017-08-19 MED ORDER — SUCCINYLCHOLINE CHLORIDE 20 MG/ML IJ SOLN
INTRAMUSCULAR | Status: AC
  Filled 2017-08-19: qty 1

## 2017-08-19 MED ORDER — DEXAMETHASONE SODIUM PHOSPHATE 10 MG/ML IJ SOLN
INTRAMUSCULAR | Status: DC | PRN
  Administered 2017-08-19: 5 mg via INTRAVENOUS

## 2017-08-19 MED ORDER — CEFAZOLIN SODIUM-DEXTROSE 2-4 GM/100ML-% IV SOLN
INTRAVENOUS | Status: AC
  Filled 2017-08-19: qty 100

## 2017-08-19 MED ORDER — PHENYLEPHRINE HCL 10 MG/ML IJ SOLN
INTRAMUSCULAR | Status: AC
  Filled 2017-08-19: qty 1

## 2017-08-19 MED ORDER — PROPOFOL 10 MG/ML IV BOLUS
INTRAVENOUS | Status: DC | PRN
  Administered 2017-08-19: 140 mg via INTRAVENOUS

## 2017-08-19 MED ORDER — PHENYLEPHRINE HCL 10 MG/ML IJ SOLN
INTRAMUSCULAR | Status: DC | PRN
  Administered 2017-08-19 (×2): 100 ug via INTRAVENOUS

## 2017-08-19 MED ORDER — HYDROCODONE-ACETAMINOPHEN 5-325 MG PO TABS: 1.0000 | ORAL_TABLET | Freq: Four times a day (QID) | ORAL | 0 refills | Status: DC | PRN

## 2017-08-19 MED ORDER — ONDANSETRON HCL 4 MG/2ML IJ SOLN
INTRAMUSCULAR | Status: AC
  Filled 2017-08-19: qty 2

## 2017-08-19 MED ORDER — EPHEDRINE SULFATE 50 MG/ML IJ SOLN
INTRAMUSCULAR | Status: DC | PRN
  Administered 2017-08-19 (×3): 7.5 mg via INTRAVENOUS

## 2017-08-19 MED ORDER — PROPOFOL 10 MG/ML IV BOLUS
INTRAVENOUS | Status: AC
  Filled 2017-08-19: qty 20

## 2017-08-19 MED ORDER — FAMOTIDINE 20 MG PO TABS
20.0000 mg | ORAL_TABLET | Freq: Once | ORAL | Status: AC
  Administered 2017-08-19: 20 mg via ORAL

## 2017-08-19 MED ORDER — FAMOTIDINE 20 MG PO TABS
ORAL_TABLET | ORAL | Status: AC
  Administered 2017-08-19: 20 mg via ORAL
  Filled 2017-08-19: qty 1

## 2017-08-19 SURGICAL SUPPLY — 40 items
BAG DRAIN CYSTO-URO LG1000N (MISCELLANEOUS) ×4 IMPLANT
BASKET ZERO TIP 1.9FR (BASKET) IMPLANT
BRUSH SCRUB EZ  4% CHG (MISCELLANEOUS) ×2
BRUSH SCRUB EZ 1% IODOPHOR (MISCELLANEOUS) ×4 IMPLANT
BRUSH SCRUB EZ 4% CHG (MISCELLANEOUS) ×2 IMPLANT
CATH URETL 5X70 OPEN END (CATHETERS) ×4 IMPLANT
CNTNR SPEC 2.5X3XGRAD LEK (MISCELLANEOUS)
CONRAY 43 FOR UROLOGY 50M (MISCELLANEOUS) ×4 IMPLANT
CONT SPEC 4OZ STER OR WHT (MISCELLANEOUS)
CONTAINER SPEC 2.5X3XGRAD LEK (MISCELLANEOUS) IMPLANT
DRAPE UTILITY 15X26 TOWEL STRL (DRAPES) ×4 IMPLANT
DRSG TELFA 4X3 1S NADH ST (GAUZE/BANDAGES/DRESSINGS) ×4 IMPLANT
ELECT REM PT RETURN 9FT ADLT (ELECTROSURGICAL) ×4
ELECTRODE REM PT RTRN 9FT ADLT (ELECTROSURGICAL) ×2 IMPLANT
FIBER LASER LITHO 273 (Laser) ×4 IMPLANT
FORCEPS BIOP PIRANHA Y (CUTTING FORCEPS) ×4 IMPLANT
GLOVE BIO SURGEON STRL SZ 6.5 (GLOVE) ×3 IMPLANT
GLOVE BIO SURGEONS STRL SZ 6.5 (GLOVE) ×1
GOWN STRL REUS W/ TWL LRG LVL3 (GOWN DISPOSABLE) ×4 IMPLANT
GOWN STRL REUS W/TWL LRG LVL3 (GOWN DISPOSABLE) ×4
GUIDEWIRE GREEN .038 145CM (MISCELLANEOUS) ×4 IMPLANT
INFUSOR MANOMETER BAG 3000ML (MISCELLANEOUS) ×4 IMPLANT
INTRODUCER DILATOR DOUBLE (INTRODUCER) IMPLANT
KIT TURNOVER CYSTO (KITS) ×4 IMPLANT
NDL SAFETY ECLIPSE 18X1.5 (NEEDLE) ×2 IMPLANT
NEEDLE HYPO 18GX1.5 SHARP (NEEDLE) ×2
PACK CYSTO AR (MISCELLANEOUS) ×4 IMPLANT
SENSORWIRE 0.038 NOT ANGLED (WIRE) ×8
SET CYSTO W/LG BORE CLAMP LF (SET/KITS/TRAYS/PACK) ×4 IMPLANT
SHEATH URETERAL 12FR 45CM (SHEATH) ×4 IMPLANT
SHEATH URETERAL 12FRX35CM (MISCELLANEOUS) IMPLANT
SOL .9 NS 3000ML IRR  AL (IV SOLUTION) ×2
SOL .9 NS 3000ML IRR UROMATIC (IV SOLUTION) ×2 IMPLANT
STENT URET 6FRX24 CONTOUR (STENTS) IMPLANT
STENT URET 6FRX26 CONTOUR (STENTS) IMPLANT
STENT URO INLAY 6FRX26CM (STENTS) ×4 IMPLANT
SURGILUBE 2OZ TUBE FLIPTOP (MISCELLANEOUS) ×4 IMPLANT
WATER STERILE IRR 1000ML POUR (IV SOLUTION) ×4 IMPLANT
WATER STERILE IRR 3000ML UROMA (IV SOLUTION) ×4 IMPLANT
WIRE SENSOR 0.038 NOT ANGLED (WIRE) ×4 IMPLANT

## 2017-08-19 NOTE — Discharge Instructions (Signed)
You have a ureteral stent in place.  This is a tube that extends from your kidney to your bladder.  This may cause urinary bleeding, burning with urination, and urinary frequency.  Please call our office or present to the ED if you develop fevers >101 or pain which is not able to be controlled with oral pain medications.  You may be given either Flomax and/ or ditropan to help with bladder spasms and stent pain in addition to pain medications.   ° °[facility] °[address], [address] °[place], [place] [address] °[phone] ° ° ° °AMBULATORY SURGERY  °DISCHARGE INSTRUCTIONS ° ° °1) The drugs that you were given will stay in your system until tomorrow so for the next 24 hours you should not: ° °A) Drive an automobile °B) Make any legal decisions °C) Drink any alcoholic beverage ° ° °2) You may resume regular meals tomorrow.  Today it is better to start with liquids and gradually work up to solid foods. ° °You may eat anything you prefer, but it is better to start with liquids, then soup and crackers, and gradually work up to solid foods. ° ° °3) Please notify your doctor immediately if you have any unusual bleeding, trouble breathing, redness and pain at the surgery site, drainage, fever, or pain not relieved by medication. ° ° ° °4) Additional Instructions: ° ° ° ° ° ° ° °Please contact your physician with any problems or Same Day Surgery at [phone], Monday through Friday 6 am to 4 pm, or [place] [facility] Main number at [phone]. °

## 2017-08-19 NOTE — Anesthesia Procedure Notes (Signed)
Procedure Name: Intubation Date/Time: 08/19/2017 11:17 AM Performed by: Chanetta Marshall, CRNA Pre-anesthesia Checklist: Patient identified, Emergency Drugs available, Suction available and Patient being monitored Patient Re-evaluated:Patient Re-evaluated prior to induction Oxygen Delivery Method: Circle system utilized Preoxygenation: Pre-oxygenation with 100% oxygen Induction Type: IV induction Ventilation: Mask ventilation without difficulty Laryngoscope Size: Mac and 3 Grade View: Grade II Tube type: Oral Tube size: 7.0 mm Number of attempts: 1 Airway Equipment and Method: Patient positioned with wedge pillow Secured at: 21 cm Tube secured with: Tape Dental Injury: Teeth and Oropharynx as per pre-operative assessment

## 2017-08-19 NOTE — Op Note (Signed)
Date of procedure: 08/19/17  Preoperative diagnosis:  1. History of bladder and ureteral carcinoma 2. Right renal pelvic mass  Postoperative diagnosis:  1. Same as above  Procedure: 1. Right ureteroscopy 2. Right distal ureteral biopsy 3. Right renal pelvic biopsy 4. Retrograde pyelogram 5. Laser fulguration of bladder tumor 6. Right ureteral stent placement  Surgeon: [name], MD  Anesthesia: General  Complications: None  Intraoperative findings: At least 2 cm tumor within the right anterior renal pelvis, nodular and high-grade appearing.  Right distal ureteral tumor also appreciated.  Subtle change overlying the right iliac portion of the ureter concerning for early tumor.  Bladder otherwise unremarkable.  EBL: Minimal  Specimens: Right renal pelvis biopsy, right distal ureteral biopsy  Drains: 6 x 26 French double-J ureteral stent on right  Indication: Anthony Gonzales is a 84 y.o. patient with personal history of right distal ureteral cancer as well as bladder cancer who was noted to have recurrent right hydronephrosis with concern within the renal pelvis as well as right distal ureter for recurrence of upper tract urothelial carcinoma after reviewing the management options for treatment, he elected to proceed with the above surgical procedure(s). We have discussed the potential benefits and risks of the procedure, side effects of the proposed treatment, the likelihood of the patient achieving the goals of the procedure, and any potential problems that might occur during the procedure or recuperation. Informed consent has been obtained.  Description of procedure:  The patient was taken to the operating room and general anesthesia was induced.  The patient was placed in the dorsal lithotomy position, prepped and draped in the usual sterile fashion, and preoperative antibiotics were administered. A preoperative time-out was performed.   A 21 French scope was advanced  per urethra into the bladder.  The bladder was carefully inspected and noted to be unremarkable except for an irregular appearance of the right UO which was somewhat heaped up and distorted.  There was a small amount of tumor fungating from the orifice itself.  At this point time, cold cup biopsy forceps were used to take very small biopsies suggest within the mouth of the ureter.  A 5 French open-ended ureteral catheter was then placed within the UO and a gentle retrograde pyelogram was performed.  This revealed a fixed defect, approximately 1 cm in length within the right distal ureter and UVJ with mild hydroureteronephrosis down to this level.  There is also a renal pelvic filling defect consistent with what was seen on previous CT scan.  A sensor wire was then placed up to level of the kidney and stepped in place.  A 4.5 French semirigid ureteroscope was then advanced just within the distal ureter where more tumor was encountered.  This was somewhat papillary in nature.  Parona forceps were used to take additional biopsies of this particular tumor.  All of this was passed off the field as right distal ureteral biopsies.  A second Super Stiff wire was then placed up to level the kidney.  A 7 French flexible digital ureteroscope was then advanced to level the renal pelvis.  This was carefully inspected and a fairly significant large nodular tumor was seen on the left anterior renal pelvis with hypervascularity noted on the surface.  This is highly concerning for high-grade lesion.  In order to facilitate adequate amount of biopsies, the scope was back to the length of the ureter inspecting along the way and a second Super Stiff wire was placed.  Upon withdrawing  the ureteroscope, the lesion was seen around the level of the iliacs which was fairly subtle which was concerning for either CIS versus early papillary change at this level but no discrete tumor.  The ureteral access sheath, Lacinda Axon 12/14 was then advanced to  the proximal ureter with a Super Stiff wire.  The inner cannula was removed.  A dual-lumen 8 French flexible ureteroscope was then brought in and multiple biopsies of the renal pelvic tumor were taken and passed off the field.  At this point time: 26 m laser fiber was then brought in and using the settings of 1 J and 20 Hz, I attempted to ablate the area, however, visualization was somewhat poor, there is fairly significant bleeding, and given the size of lesion, felt like it was not making much progress here.  As such, this attempt was abandoned.  The scope was then backed down the length of the ureter inspecting along the way.  There are no ureteral injuries appreciated.  I did readvanced a 4.5 semirigid ureteroscope within the distal ureter and attempt to ablate any obvious tumor within the distal ureter.  Care was taken to be judicious in order to avoid ureteral stricture.  Finally, the safety wire was backloaded over rigid cystoscope.  A 6 x 26 French double-J Bard Optima ureteral stent was advanced over the wire up to level the renal pelvis.  The wires partially drawn until focal stone within the renal pelvis.  The wire was then fully withdrawn and full coils noted within the bladder.  The bladder was then drained.  The patient was then clean and dry, repositioned supine position, reversed anesthesia, and taken to the PACU in stable condition.  Plan: Patient will return next week to discuss pathology results.  Plan for stent TBD based on pathology.  [name], M.D.

## 2017-08-19 NOTE — Anesthesia Preprocedure Evaluation (Signed)
Anesthesia Evaluation  Patient identified by MRN, date of birth, ID band Patient awake    Reviewed: Allergy & Precautions, H&P , NPO status , Patient's Chart, lab work & pertinent test results, reviewed documented beta blocker date and time   Airway Mallampati: II  TM Distance: >3 FB Neck ROM: full    Dental  (+) Teeth Intact   Pulmonary neg pulmonary ROS,    Pulmonary exam normal        Cardiovascular Exercise Tolerance: Good hypertension, On Medications negative cardio ROS Normal cardiovascular exam Rhythm:regular Rate:Normal     Neuro/Psych  Headaches, negative neurological ROS  negative psych ROS   GI/Hepatic negative GI ROS, Neg liver ROS,   Endo/Other  negative endocrine ROS  Renal/GU Renal diseasenegative Renal ROS  negative genitourinary   Musculoskeletal   Abdominal   Peds  Hematology negative hematology ROS (+)   Anesthesia Other Findings Past Medical History: No date: Arthritis 08/23/2013: Benign fibroma of prostate No date: BPH (benign prostatic hyperplasia) 08/23/2013: Calculus of kidney No date: Cancer ([place])     Comment:  bladder cancer and skin cancer No date: Glaucoma     Comment:  no drops in 3 mo pressure good, pt denies glaucoma, eye               pressure has been measuring alright. No date: History of kidney stones No date: HLD (hyperlipidemia) No date: HOH (hard of hearing)     Comment:  Left Hearing Aid 12/26/2014: HTN (hypertension) No date: Hypertension 12/26/2014: Hyponatremia No date: Migraines     Comment:  history of migraines when he was younger. No date: Restless leg syndrome No date: Sinus drainage 12/26/2014: UTI (lower urinary tract infection) No date: Vertigo Past Surgical History: No date: COLONOSCOPY 01/30/2015: CYSTOSCOPY W/ RETROGRADES; Right     Comment:  Procedure: CYSTOSCOPY WITH RETROGRADE PYELOGRAM;                Surgeon: [name], MD;  Location:  [facility];                Service: Urology;  Laterality: Right; 02/26/2016: CYSTOSCOPY W/ RETROGRADES; Bilateral     Comment:  Procedure: CYSTOSCOPY WITH RETROGRADE PYELOGRAM;                Surgeon: [name], MD;  Location: [facility];                Service: Urology;  Laterality: Bilateral; 08/20/2015: CYSTOSCOPY W/ URETERAL STENT PLACEMENT; Right     Comment:  Procedure: CYSTOSCOPY WITH RETROGRADE PYELOGRAM/POSSIBLE              URETERAL STENT PLACEMENT/BLADDER BIOPSY;  Surgeon: [name], MD;  Location: [facility];  Service: Urology;                Laterality: Right; 09/12/2015: CYSTOSCOPY W/ URETERAL STENT PLACEMENT; Right     Comment:  Procedure: CYSTOSCOPY WITH STENT REPLACEMENT;  Surgeon:               [name], MD;  Location: [facility];  Service:               Urology;  Laterality: Right; 09/12/2015: CYSTOSCOPY WITH BIOPSY; Right     Comment:  Procedure: CYSTOSCOPY WITH BLADDER AND URETERAL BIOPSY;               Surgeon: [name], MD;  Location: [facility];  Service: Urology;  Laterality: Right; 01/30/2015: CYSTOSCOPY WITH STENT PLACEMENT; Right     Comment:  Procedure: [place];  Surgeon:               [name], MD;  Location: [facility];  Service:               Urology;  Laterality: Right; No date: EYE SURGERY; Bilateral     Comment:  Cataract Extraction with IOL 08/20/2015: HOLMIUM LASER APPLICATION; N/A     Comment:  Procedure:  HOLMIUM LASER APPLICATION;  Surgeon: [name], MD;  Location: [facility];  Service: Urology;                Laterality: N/A; No date: SPERMATOCELECTOMY No date: TONSILLECTOMY 02/26/2016: TRANSURETHRAL RESECTION OF BLADDER TUMOR; N/A     Comment:  Procedure: TRANSURETHRAL RESECTION OF BLADDER TUMOR               (TURBT);  Surgeon: [name], MD;  Location: [facility]               [facility];  Service: Urology;  Laterality: N/A; 09/12/2015: TRANSURETHRAL RESECTION OF BLADDER  TUMOR WITH MITOMYCIN-C;  N/A     Comment:  Procedure: TRANSURETHRAL RESECTION OF BLADDER TUMOR ;                Surgeon: [name], MD;  Location: [facility];                Service: Urology;  Laterality: N/A; 03/24/2016: TRANSURETHRAL RESECTION OF BLADDER TUMOR WITH MITOMYCIN-C;  N/A     Comment:  Procedure: TRANSURETHRAL RESECTION OF BLADDER TUMOR WITH              MITOMYCIN-C  (SMALL);  Surgeon: [name], MD;                Location: [facility];  Service: Urology;  Laterality: N/A; 01/30/2015: URETEROSCOPY; Right     Comment:  Procedure: URETEROSCOPY/ WITH BIOPSY AND CYTOLOGY               BRUSHING;  Surgeon: [name], MD;  Location: [facility]               [facility];  Service: Urology;  Laterality: Right; 08/20/2015: URETEROSCOPY; Right     Comment:  Procedure: URETEROSCOPY;  Surgeon: [name], MD;                Location: [facility];  Service: Urology;  Laterality:               Right; 09/12/2015: URETEROSCOPY; Right     Comment:  Procedure: URETEROSCOPY;  Surgeon: [name], MD;                Location: [facility];  Service: Urology;  Laterality:               Right; 02/26/2016: URETEROSCOPY; Right     Comment:  Procedure: URETEROSCOPY;  Surgeon: [name], MD;                Location: [facility];  Service: Urology;  Laterality:               Right; BMI    Body Mass Index:  26.45 kg/m     Reproductive/Obstetrics negative OB ROS  Anesthesia Physical Anesthesia Plan  ASA: III  Anesthesia Plan: General ETT   Post-op Pain Management:    Induction:   PONV Risk Score and Plan:   Airway Management Planned:   Additional Equipment:   Intra-op Plan:   Post-operative Plan:   Informed Consent: I have reviewed the patients History and Physical, chart, labs and discussed the procedure including the risks, benefits and alternatives for the proposed anesthesia with the patient or authorized representative who has  indicated his/her understanding and acceptance.   Dental Advisory Given  Plan Discussed with: CRNA  Anesthesia Plan Comments:         Anesthesia Quick Evaluation

## 2017-08-19 NOTE — Anesthesia Postprocedure Evaluation (Signed)
Anesthesia Post Note  Patient: Anthony Gonzales  Procedure(s) Performed: CYSTOSCOPY/URETEROSCOPY/HOLMIUM LASER/STENT PLACEMENT (Right ) Renal Mass BIOPSY (Right )  Patient location during evaluation: PACU Anesthesia Type: General Level of consciousness: awake and alert Pain management: pain level controlled Vital Signs Assessment: post-procedure vital signs reviewed and stable Respiratory status: spontaneous breathing, nonlabored ventilation, respiratory function stable and patient connected to nasal cannula oxygen Cardiovascular status: blood pressure returned to baseline and stable Postop Assessment: no apparent nausea or vomiting Anesthetic complications: no     Last Vitals:  Vitals:   08/19/17 1320 08/19/17 1332  BP: (!) 151/78 (!) 145/81  Pulse: 77 72  Resp: 16 16  Temp:  36.9 C  SpO2: 98% 97%    Last Pain:  Vitals:   08/19/17 1332  TempSrc: Oral  PainSc: 0-No pain                 Molli Barrows

## 2017-08-19 NOTE — Interval H&P Note (Signed)
History and Physical Interval Note:  08/19/2017 10:49 AM  Anthony Gonzales  has presented today for surgery, with the diagnosis of right renal mass  The various methods of treatment have been discussed with the patient and family. After consideration of risks, benefits and other options for treatment, the patient has consented to  Procedure(s): CYSTOSCOPY/URETEROSCOPY/HOLMIUM LASER/STENT PLACEMENT (Right) Renal Mass BIOPSY (Right) CYSTOSCOPY WITH Bladder BIOPSY (N/A) as a surgical intervention .  The patient's history has been reviewed, patient examined, no change in status, stable for surgery.  I have reviewed the patient's chart and labs.  Questions were answered to the patient's satisfaction.    RRR CTAB  [name]

## 2017-08-19 NOTE — Transfer of Care (Signed)
Immediate Anesthesia Transfer of Care Note  Patient: Anthony Gonzales  Procedure(s) Performed: CYSTOSCOPY/URETEROSCOPY/HOLMIUM LASER/STENT PLACEMENT (Right ) Renal Mass BIOPSY (Right )  Patient Location: PACU  Anesthesia Type:General  Level of Consciousness: awake, alert  and oriented  Airway & Oxygen Therapy: Patient Spontanous Breathing and Patient connected to face mask oxygen  Post-op Assessment: Report given to RN and Post -op Vital signs reviewed and stable  Post vital signs: Reviewed and stable  Last Vitals:  Vitals Value Taken Time  BP 148/74 08/19/2017 12:52 PM  Temp 36.3 C 08/19/2017 12:52 PM  Pulse 76 08/19/2017 12:52 PM  Resp 17 08/19/2017 12:52 PM  SpO2 100 % 08/19/2017 12:52 PM  Vitals shown include unvalidated device data.  Last Pain:  Vitals:   08/19/17 0951  TempSrc: Tympanic  PainSc: 0-No pain         Complications: No apparent anesthesia complications

## 2017-08-19 NOTE — Anesthesia Post-op Follow-up Note (Signed)
Anesthesia QCDR form completed.        

## 2017-08-24 ENCOUNTER — Other Ambulatory Visit: Payer: Self-pay | Admitting: Urology

## 2017-08-25 ENCOUNTER — Encounter: Payer: Self-pay | Admitting: Urology

## 2017-08-25 ENCOUNTER — Ambulatory Visit: Payer: Medicare HMO

## 2017-08-25 ENCOUNTER — Ambulatory Visit (INDEPENDENT_AMBULATORY_CARE_PROVIDER_SITE_OTHER): Payer: Medicare HMO | Admitting: Urology

## 2017-08-25 VITALS — BP 158/92 | HR 73 | Resp 16 | Ht 72.0 in | Wt 192.7 lb

## 2017-08-25 DIAGNOSIS — C651 Malignant neoplasm of right renal pelvis: Secondary | ICD-10-CM | POA: Diagnosis not present

## 2017-08-25 NOTE — Progress Notes (Signed)
08/25/2017 3:01 PM   Anthony Gonzales Mar 29, 1931 [id number]  Referring provider: Idelle Crouch, MD [facility] [place], [place] [address]  Chief Complaint  Patient presents with  . Post-op Follow-up    HPI: 84 year old male with extensive history of right upper tract and bladder cancer who recently was taken to the operating room on 08/19/2017 for diagnostic ureteroscopy found to have 2 high-grade lesions within his right kidney.  He returns today to discuss his pathology results.  Intraoperatively, he was noted to have a nodular high-grade appearing lesion within the right anterior renal pelvis.  Pathology was consistent with high-grade urothelial carcinoma but depth of invasion was not able to be assessed.  He also had a right distal ureteral tumor fungating from the right UO extending into the distal ureter which was also consistent with high-grade invasive urothelial carcinoma.  On preoperative CT scan performed on 08/06/2017, the lesion within the right renal pelvis measured 2.6 x 1.6 as well as thickening of the urothelium at the right UVJ.  No obvious lymphadenopathy was appreciated.  No other evidence of metastatic disease.  A Bard Optima ureteral stent was placed in the setting of hydronephrosis on that side which remains in place.  He has been tolerating stent well.  His hematuria has resolved.  Notably, he previously refused nephro ureterectomy and/or distal ureterectomy.  He is now currently interested in pursuing this.  Overall, his functional status is excellent.  He performs all of his only ADLs.  He has a strong family history of longevity into the 90s.  Bladder cancer history: He has an extensive personal history of urothelial carcinoma.  He initially presented in 12/2014 with incidental moderate right hydroureteronephrosis.  He was taken to the OR on 01/30/2015 at which time right ureteroscopy revealed a suspicious area within the right  distal ureter with a shaggy, irregular appearance but no obvious papillary or pedunculated tumor. Brush cytology was negative for any malignancy. Ureteral biopsy revealed some atypia favoring neoplasm but essentially was non-diagnostic. Interval follow-up renal ultrasound on 05/02/2015 did show complete resolution of his hydronephrosis. He was counseled to return for a second look but deferred this for ~6 months. He eventually returnedto the OR on 08/20/15 at which time the right distal ureter was highly suspicious for papillary urothelial carcinoma andwas biopsied. 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 He returned to operating room on 09/12/2015 for a second look ureteroscopy/biopsy and TURBT. Given the depth of resection, mitomycin was not administered. Pathology showed no residual tumor in the bladder or the distal ureter. A Bard Optima stent was replaced at the time of the procedure. He completed an induction course of BCG completed on 11/30/2015 6 doses. This was performed with the stent in place. The stent was subsequently removed. Most recent renal ultrasound on 01/30/2016 shows no evidence of hydronephrosis bilaterally. Surveillance cystoscopy performed on 02/06/2016 noted possible recurrence at bladder neck- recommend TURBT with mitomycin. He was taken to the OR on 02/26/2016 for TURBT, bilateral RTG's and right ureteroscopy. 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 No muscle in the specimen. Recommend return to OR for re-TUR with mitomycin per AUA/ NCCN guidelines due to risk up upstaging. Re-TUR occurred on 03/24/2016. Pathology noted no evidence of residual tumor. He eventually underwent induction BCG. He finished his  first round of maintenance BCGonOctober 23, 2018.      PMH: Past Medical History:  Diagnosis Date  . Arthritis   . Benign fibroma of prostate 08/23/2013  . BPH (benign prostatic hyperplasia)   . Calculus of kidney 08/23/2013  . Cancer [facility])    bladder cancer and skin cancer  . Glaucoma    no drops in 3 mo pressure good, pt denies glaucoma, eye pressure has been measuring alright.  . History of kidney stones   . HLD (hyperlipidemia)   . HOH (hard of hearing)    Left Hearing Aid  . HTN (hypertension) 12/26/2014  . Hypertension   . Hyponatremia 12/26/2014  . Migraines    history of migraines when he was younger.  [name] Restless leg syndrome   . Sinus drainage   . UTI (lower urinary tract infection) 12/26/2014  . Vertigo     Surgical History: Past Surgical History:  Procedure Laterality Date  . COLONOSCOPY    . CYSTOSCOPY W/ RETROGRADES Right 01/30/2015   Procedure: CYSTOSCOPY WITH RETROGRADE PYELOGRAM;  Surgeon: [name], MD;  Location: [facility];  Service: Urology;  Laterality: Right;  . CYSTOSCOPY W/ RETROGRADES Bilateral 02/26/2016   Procedure: CYSTOSCOPY WITH RETROGRADE PYELOGRAM;  Surgeon: [name], MD;  Location: [facility];  Service: Urology;  Laterality: Bilateral;  . CYSTOSCOPY W/ URETERAL STENT PLACEMENT Right 08/20/2015   Procedure: CYSTOSCOPY WITH RETROGRADE PYELOGRAM/POSSIBLE URETERAL STENT PLACEMENT/BLADDER BIOPSY;  Surgeon: [name], MD;  Location: [facility];  Service: Urology;  Laterality: Right;  . CYSTOSCOPY W/ URETERAL STENT PLACEMENT Right 09/12/2015   Procedure: CYSTOSCOPY WITH STENT REPLACEMENT;  Surgeon: [name], MD;  Location: [facility];  Service: Urology;  Laterality: Right;  . CYSTOSCOPY WITH BIOPSY Right 09/12/2015   Procedure: CYSTOSCOPY WITH BLADDER AND URETERAL BIOPSY;  Surgeon: [name], MD;  Location: [facility];  Service: Urology;  Laterality: Right;  . CYSTOSCOPY WITH STENT PLACEMENT Right 01/30/2015   Procedure: CYSTOSCOPY WITH STENT PLACEMENT;  Surgeon: [name], MD;   Location: [facility];  Service: Urology;  Laterality: Right;  . CYSTOSCOPY/URETEROSCOPY/HOLMIUM LASER/STENT PLACEMENT Right 08/19/2017   Procedure: CYSTOSCOPY/URETEROSCOPY/HOLMIUM LASER/STENT PLACEMENT;  Surgeon: [name], MD;  Location: [facility];  Service: Urology;  Laterality: Right;  . EYE SURGERY Bilateral    Cataract Extraction with IOL  . HOLMIUM LASER APPLICATION N/A 3/53/6144   Procedure:  HOLMIUM LASER APPLICATION;  Surgeon: [name], MD;  Location: [facility];  Service: Urology;  Laterality: N/A;  . SPERMATOCELECTOMY    . TONSILLECTOMY    . TRANSURETHRAL RESECTION OF BLADDER TUMOR N/A 02/26/2016   Procedure: TRANSURETHRAL RESECTION OF BLADDER TUMOR (TURBT);  Surgeon: [name], MD;  Location: [facility];  Service: Urology;  Laterality: N/A;  . TRANSURETHRAL RESECTION OF BLADDER TUMOR WITH MITOMYCIN-C N/A 09/12/2015   Procedure: TRANSURETHRAL RESECTION OF BLADDER TUMOR ;  Surgeon: [name], MD;  Location: [facility];  Service: Urology;  Laterality: N/A;  . TRANSURETHRAL RESECTION OF BLADDER TUMOR WITH MITOMYCIN-C N/A 03/24/2016   Procedure: TRANSURETHRAL RESECTION OF BLADDER TUMOR WITH MITOMYCIN-C  (SMALL);  Surgeon: [name], MD;  Location: [facility];  Service: Urology;  Laterality: N/A;  . URETERAL BIOPSY Right 08/19/2017   Procedure: Renal Mass BIOPSY;  Surgeon: [name], MD;  Location: [facility];  Service: Urology;  Laterality: Right;  . URETEROSCOPY Right 01/30/2015   Procedure: URETEROSCOPY/ WITH BIOPSY AND CYTOLOGY BRUSHING;  Surgeon: [name], MD;  Location: [facility];  Service: Urology;  Laterality: Right;  . URETEROSCOPY Right 08/20/2015  Procedure: URETEROSCOPY;  Surgeon: [name], MD;  Location: [facility];  Service: Urology;  Laterality: Right;  . URETEROSCOPY Right 09/12/2015   Procedure: URETEROSCOPY;  Surgeon: [name], MD;  Location: [facility];  Service: Urology;  Laterality: Right;  . URETEROSCOPY Right 02/26/2016   Procedure:  URETEROSCOPY;  Surgeon: [name], MD;  Location: [facility];  Service: Urology;  Laterality: Right;    Home Medications:  Allergies as of 08/25/2017      Reactions   Demerol [meperidine] Nausea And Vomiting   Lipitor [atorvastatin] Swelling   Sulfa Antibiotics Nausea And Vomiting, Rash      Medication List        Accurate as of 08/25/17  3:01 PM. Always use your most recent med list.          acetaminophen 500 MG tablet Commonly known as:  TYLENOL Take 1,000 mg by mouth daily as needed for moderate pain.   benazepril-hydrochlorthiazide 20-12.5 MG tablet Commonly known as:  LOTENSIN HCT Take 1 tablet by mouth daily.   diazepam 2 MG tablet Commonly known as:  VALIUM Take 2 mg by mouth every 12 (twelve) hours as needed for anxiety.   docusate sodium 100 MG capsule Commonly known as:  COLACE Take 100 mg by mouth daily as needed for mild constipation.   docusate sodium 100 MG capsule Commonly known as:  COLACE Take 1 capsule (100 mg total) by mouth 2 (two) times daily.   fenofibrate micronized 134 MG capsule Commonly known as:  LOFIBRA Take 134 mg by mouth daily before breakfast.   fluticasone 50 MCG/ACT nasal spray Commonly known as:  FLONASE Place 2 sprays into both nostrils at bedtime.   HYDROcodone-acetaminophen 5-325 MG tablet Commonly known as:  NORCO/VICODIN Take 1-2 tablets by mouth every 6 (six) hours as needed for moderate pain.   hydrocortisone cream 1 % Apply 1 application topically daily as needed for itching.   ICAPS AREDS 2 Caps Take 1 capsule by mouth 2 (two) times daily.   multivitamin with minerals Tabs tablet Take 1 tablet by mouth daily.   Potassium 99 MG Tabs Take 99 mg by mouth at bedtime.   psyllium 58.6 % packet Commonly known as:  METAMUCIL Take 1 packet by mouth daily as needed (constipation).   tamsulosin 0.4 MG Caps capsule Commonly known as:  FLOMAX Take 1 capsule (0.4 mg total) by mouth daily.   tamsulosin 0.4 MG Caps  capsule Commonly known as:  FLOMAX Take 1 capsule (0.4 mg total) by mouth daily.       Allergies:  Allergies  Allergen Reactions  . Demerol [Meperidine] Nausea And Vomiting  . Lipitor [Atorvastatin] Swelling  . Sulfa Antibiotics Nausea And Vomiting and Rash    Family History: Family History  Problem Relation Age of Onset  . Hypertension Mother   . Hypertension Father   . Prostate cancer Brother   . Kidney disease Neg Hx   . Kidney cancer Neg Hx   . Bladder Cancer Neg Hx     Social History:  reports that he has never smoked. He has never used smokeless tobacco. He reports that he does not drink alcohol or use drugs.  ROS: UROLOGY Frequent Urination?: Yes Hard to postpone urination?: No Burning/pain with urination?: No Get up at night to urinate?: Yes Leakage of urine?: No Urine stream starts and stops?: Yes Trouble starting stream?: Yes Do you have to strain to urinate?: Yes Blood in urine?: No Urinary tract infection?: No Sexually transmitted disease?: No  Injury to kidneys or bladder?: No Painful intercourse?: No Weak stream?: No Erection problems?: No Penile pain?: No  Gastrointestinal Nausea?: No Vomiting?: No Indigestion/heartburn?: No Diarrhea?: No Constipation?: No  Constitutional Fever: No Night sweats?: No Weight loss?: No Fatigue?: No  Skin Skin rash/lesions?: No Itching?: No  Eyes Blurred vision?: No Double vision?: No  Ears/Nose/Throat Sore throat?: No Sinus problems?: Yes  Hematologic/Lymphatic Swollen glands?: No Easy bruising?: Yes  Cardiovascular Leg swelling?: No Chest pain?: No  Respiratory Cough?: Yes Shortness of breath?: No  Endocrine Excessive thirst?: No  Musculoskeletal Back pain?: No Joint pain?: No  Neurological Headaches?: No Dizziness?: No  Psychologic Depression?: No Anxiety?: Yes  Physical Exam: BP (!) 158/92   Pulse 73   Resp 16   Ht 6' (1.829 m)   Wt 192 lb 11.2 oz (87.4 kg)   SpO2  96%   BMI 26.13 kg/m   Constitutional:  Alert and oriented, No acute distress.  Accompanied by wife today. HEENT: [place] AT, moist mucus membranes.  Trachea midline, no masses. Cardiovascular: No clubbing, cyanosis, or edema. Respiratory: Normal respiratory effort, no increased work of breathing. GI: Abdomen is soft, nontender, nondistended, no abdominal masses GU: No CVA tenderness Skin: No rashes, bruises or suspicious lesions. Neurologic: Grossly intact, no focal deficits, moving all 4 extremities. Psychiatric: Normal mood and affect.  Laboratory Data: Lab Results  Component Value Date   WBC 6.5 03/17/2016   HGB 13.9 03/17/2016   HCT 41.2 03/17/2016   MCV 88.0 03/17/2016   PLT 260 03/17/2016    Lab Results  Component Value Date   CREATININE 1.30 (H) 08/06/2017    Urinalysis    Component Value Date/Time   COLORURINE YELLOW (A) 03/17/2016 1335   APPEARANCEUR Clear 08/12/2017 1611   LABSPEC 1.012 03/17/2016 1335   LABSPEC 1.004 03/22/2013 0938   PHURINE 5.0 03/17/2016 1335   GLUCOSEU Negative 08/12/2017 1611   GLUCOSEU Negative 03/22/2013 0938   HGBUR NEGATIVE 03/17/2016 1335   BILIRUBINUR Negative 08/12/2017 1611   BILIRUBINUR Negative 03/22/2013 [place] 03/17/2016 1335   PROTEINUR Negative 08/12/2017 1611   PROTEINUR NEGATIVE 03/17/2016 1335   NITRITE Negative 08/12/2017 1611   NITRITE NEGATIVE 03/17/2016 1335   LEUKOCYTESUR Negative 08/12/2017 1611   LEUKOCYTESUR Negative 03/22/2013 0938    Lab Results  Component Value Date   LABMICR See below: 04/17/2017   WBCUA 0-5 04/17/2017   RBCUA 0-2 04/17/2017   LABEPIT None seen 04/17/2017   MUCUS Present (A) 06/03/2016   BACTERIA None seen 04/17/2017    Pertinent Imaging: No new interval imaging  Assessment & Plan:    1. Malignant neoplasm of right renal pelvis (HCC) Multifocal high-grade invasive urothelial carcinoma of the renal pelvis and UVJ  Pathology reviewed with patient  We  discussed treatment options at this point which include nephro ureterectomy with or without neoadjuvant chemotherapy Given his age, I am concerned that he would not build to tolerate chemotherapy- have agreed to discuss the patient at tumor board and see if medical oncology felt that the patient would be appropriate for this, will place referral based on our discussion (will add addendum to this note and call patient once this discussion is taken place)  Although the patient is in his 63s, he is fairly healthy and has an excellent functional status.  I feel that he would be able to tolerate right nephro ureterectomy, ideally robotic to allow for more prompt recovery.  We discussed the surgery at length today.  His  wife indicates today that they were advised to seek a second opinion by their insurance company and have an appointment with Dr. Karsten Ro at [facility] urology.  I have in fact encouraged him to follow-up with Dr. Tresa Moore or another surgeon who is more familiar with robotics as I feel that this would be the best treatment modality for him.  He will also need further staging with CMP and chest CT to rule out metastatic disease.  We will go ahead and order this to expedite his work-up.  All questions were answered today.  [name], MD  [facility] [address], [place], [place] [address] [phone]  I spent 25 min with this patient of which greater than 50% was spent in counseling and coordination of care with the patient.

## 2017-08-27 ENCOUNTER — Telehealth: Payer: Self-pay | Admitting: Urology

## 2017-08-27 ENCOUNTER — Inpatient Hospital Stay: Payer: Medicare HMO | Attending: Urology

## 2017-08-27 DIAGNOSIS — C651 Malignant neoplasm of right renal pelvis: Secondary | ICD-10-CM

## 2017-08-27 DIAGNOSIS — C649 Malignant neoplasm of unspecified kidney, except renal pelvis: Secondary | ICD-10-CM | POA: Insufficient documentation

## 2017-08-27 NOTE — Telephone Encounter (Signed)
CT is Pending prior auth I will have this scheduled as soon as it gets approved.   Sharyn Lull

## 2017-08-27 NOTE — Telephone Encounter (Signed)
Case discussed at tumor board multidisciplinary conference today at [facility].  Primarily discussed the role of neoadjuvant chemotherapy.  The consensus was that if would consider chemo, then giving it upfront would likely benefit him both from his ability to tolerate the chemo prior to surgical intervention ( as recovery will likely have an impact on his overall functional status) as well as from a renal function standpoint.  We will place a referral to medical oncology to discuss further.    Left message on machine briefly outlining this although it was also discussed at his appointment.  Encouraged him to call back with questions or concerns.  [name], MD

## 2017-09-01 ENCOUNTER — Ambulatory Visit: Payer: Medicare HMO

## 2017-09-03 ENCOUNTER — Inpatient Hospital Stay: Payer: Medicare HMO | Admitting: Oncology

## 2017-09-04 ENCOUNTER — Other Ambulatory Visit: Payer: Medicare HMO

## 2017-09-04 ENCOUNTER — Other Ambulatory Visit: Payer: Self-pay | Admitting: Family Medicine

## 2017-09-04 ENCOUNTER — Ambulatory Visit
Admission: RE | Admit: 2017-09-04 | Discharge: 2017-09-04 | Disposition: A | Discharge status: Home / Self Care | Payer: Medicare HMO | Source: Ambulatory Visit | Attending: Urology | Admitting: Urology

## 2017-09-04 DIAGNOSIS — R918 Other nonspecific abnormal finding of lung field: Secondary | ICD-10-CM | POA: Insufficient documentation

## 2017-09-04 DIAGNOSIS — C651 Malignant neoplasm of right renal pelvis: Secondary | ICD-10-CM

## 2017-09-04 MED ORDER — IOPAMIDOL (ISOVUE-300) INJECTION 61%
60.0000 mL | Freq: Once | INTRAVENOUS | Status: AC | PRN
  Administered 2017-09-04: 60 mL via INTRAVENOUS

## 2017-09-05 LAB — CBC WITH DIFFERENTIAL/PLATELET
BASOS: 1 %
Basophils Absolute: 0 10*3/uL (ref 0.0–0.2)
EOS (ABSOLUTE): 0.3 10*3/uL (ref 0.0–0.4)
Eos: 4 %
Hematocrit: 40.5 % (ref 37.5–51.0)
Hemoglobin: 14.1 g/dL (ref 13.0–17.7)
IMMATURE GRANS (ABS): 0 10*3/uL (ref 0.0–0.1)
IMMATURE GRANULOCYTES: 0 %
LYMPHS ABS: 1.9 10*3/uL (ref 0.7–3.1)
Lymphs: 26 %
MCH: 30.1 pg (ref 26.6–33.0)
MCHC: 34.8 g/dL (ref 31.5–35.7)
MCV: 86 fL (ref 79–97)
MONOS ABS: 0.6 10*3/uL (ref 0.1–0.9)
Monocytes: 8 %
NEUTROS ABS: 4.6 10*3/uL (ref 1.4–7.0)
Neutrophils: 61 %
PLATELETS: 282 10*3/uL (ref 150–450)
RBC: 4.69 x10E6/uL (ref 4.14–5.80)
RDW: 14.5 % (ref 12.3–15.4)
WBC: 7.4 10*3/uL (ref 3.4–10.8)

## 2017-09-07 ENCOUNTER — Telehealth: Payer: Self-pay | Admitting: Urology

## 2017-09-07 NOTE — Telephone Encounter (Signed)
Reviewed chest CT with patient by telephone today.  It appears that he likely has metastatic disease to his lung.  Role for nephroureterectomy at this point would be limited if this is the case.  He was scheduled to see Dr. Janese Banks last week but missed this appointment.  He reports that he was confused and thought that his appointment was on Friday, in reality it was Thursday.  He has rescheduled his oncology appointment and I stressed the importance of following up with the oncology department as scheduled especially in light of the CT scan findings.  He is agreeable this plan.  He does have an appointment next week at [facility] urology with Dr. Karsten Ro who does not do nephro ureterectomy's.  In addition, role of nephro ureterectomy at this point is somewhat limited.  I discussed that I like to see him in about 3 months to discuss stent exchange.  He is agreeable this plan.  [name], MD

## 2017-09-08 ENCOUNTER — Ambulatory Visit: Payer: Medicare HMO

## 2017-09-08 NOTE — Telephone Encounter (Signed)
I am seeing him in 1 week. I was wondering since this is a solitary Rul nodule.- if we should biopsy this. If biopsy does not show urothelial carcinoma- would you consider surgery post chemo. Alternatively I can give chemo and see what happens to the nodule. If it shrinks- it is likely to be metastatic.

## 2017-09-08 NOTE — Telephone Encounter (Signed)
I think either approach would be fine.  Ultimately, I think he wants surgery elsewhere so ultimately it would be up to that surgeon but I would be fine with either approach.    [name], MD

## 2017-09-15 ENCOUNTER — Other Ambulatory Visit: Payer: Self-pay | Admitting: Oncology

## 2017-09-15 ENCOUNTER — Inpatient Hospital Stay: Payer: Medicare HMO | Attending: Oncology | Admitting: Oncology

## 2017-09-15 ENCOUNTER — Encounter: Payer: Self-pay | Admitting: Oncology

## 2017-09-15 ENCOUNTER — Other Ambulatory Visit: Payer: Self-pay

## 2017-09-15 VITALS — BP 127/79 | HR 60 | Temp 98.4°F | Resp 12 | Ht 72.0 in | Wt 192.7 lb

## 2017-09-15 DIAGNOSIS — R918 Other nonspecific abnormal finding of lung field: Secondary | ICD-10-CM | POA: Diagnosis not present

## 2017-09-15 DIAGNOSIS — C661 Malignant neoplasm of right ureter: Secondary | ICD-10-CM | POA: Insufficient documentation

## 2017-09-15 DIAGNOSIS — Z5111 Encounter for antineoplastic chemotherapy: Secondary | ICD-10-CM | POA: Diagnosis not present

## 2017-09-15 DIAGNOSIS — C669 Malignant neoplasm of unspecified ureter: Secondary | ICD-10-CM

## 2017-09-15 DIAGNOSIS — Z8042 Family history of malignant neoplasm of prostate: Secondary | ICD-10-CM | POA: Insufficient documentation

## 2017-09-15 DIAGNOSIS — Z8551 Personal history of malignant neoplasm of bladder: Secondary | ICD-10-CM | POA: Insufficient documentation

## 2017-09-15 DIAGNOSIS — Z7189 Other specified counseling: Secondary | ICD-10-CM

## 2017-09-15 NOTE — Progress Notes (Signed)
Patient here for for initial visit. He has been feeling very anxious since his diagnosis.

## 2017-09-17 ENCOUNTER — Other Ambulatory Visit: Payer: Self-pay

## 2017-09-17 ENCOUNTER — Telehealth: Payer: Self-pay | Admitting: Oncology

## 2017-09-17 ENCOUNTER — Encounter: Payer: Self-pay | Admitting: Oncology

## 2017-09-17 DIAGNOSIS — Z7189 Other specified counseling: Secondary | ICD-10-CM | POA: Insufficient documentation

## 2017-09-17 DIAGNOSIS — C669 Malignant neoplasm of unspecified ureter: Secondary | ICD-10-CM

## 2017-09-17 MED ORDER — LIDOCAINE-PRILOCAINE 2.5-2.5 % EX CREA: TOPICAL_CREAM | CUTANEOUS | 3 refills | Status: DC

## 2017-09-17 MED ORDER — ONDANSETRON HCL 8 MG PO TABS: 8.0000 mg | ORAL_TABLET | Freq: Two times a day (BID) | ORAL | 1 refills | Status: DC | PRN

## 2017-09-17 MED ORDER — PROCHLORPERAZINE MALEATE 10 MG PO TABS: 10.0000 mg | ORAL_TABLET | Freq: Four times a day (QID) | ORAL | 1 refills | Status: DC | PRN

## 2017-09-17 MED ORDER — LORAZEPAM 0.5 MG PO TABS: 0.5000 mg | ORAL_TABLET | Freq: Four times a day (QID) | ORAL | 0 refills | Status: DC | PRN

## 2017-09-17 MED ORDER — DEXAMETHASONE 4 MG PO TABS: 8.0000 mg | ORAL_TABLET | Freq: Every day | ORAL | 1 refills | Status: DC

## 2017-09-17 NOTE — Progress Notes (Signed)
START ON PATHWAY REGIMEN - Bladder     A cycle is every 21 days:     Carboplatin      Gemcitabine   **Always confirm dose/schedule in your pharmacy ordering system**  Patient Characteristics: Metastatic Disease, First Line, No Prior Neoadjuvant/Adjuvant Therapy, Poor Renal Function (CrCl < 50 mL/min), Unknown PD-L1 Expression AJCC M Category: M1b AJCC N Category: N0 AJCC T Category: T2 Current evidence of distant metastases<= Yes AJCC 8 Stage Grouping: IVB Line of Therapy: First Line Prior Neoadjuvant/Adjuvant Therapy<= No Renal Function: Poor Renal Function (CrCl < 50 mL/min) PD-L1 Expression Status: Unknown PD-L1 Expression Intent of Therapy: Non-Curative / Palliative Intent, Discussed with Patient

## 2017-09-17 NOTE — Progress Notes (Signed)
Hematology/Oncology Consult note [facility] [phone] [phone]  Patient Care Team: Idelle Crouch, MD as PCP - General (Internal Medicine)   Name of the patient: Anthony Gonzales  [id number]  November 28, 1931    Reason for referral- urothelial carcinoma   Referring physician- [name]  Date of visit: 09/17/17   History of presenting illness-patient is a 84 year old male with past medical history significant for hypertension and long-standing history of superficial bladder cancer for which he sees [name].  He has undergone TURBT as well as ureteroscopy since 2017 along with mitomycin as well in the past.  Most recently he underwent CT abdomen on 08/06/2017 which showed a soft tissue mass in the right renal pelvis concerning for upper tract urothelial neoplasm.  Soft tissue fullness at the right ureterovesical junction with proximal right hydroureteronephrosis.  Several millimeter attenuation lesion in the pancreatic head.  He underwent diagnostic ureteroscopy and was found to have 2 high-grade lesions within his right kidney.  There was a nodular high-grade appearing lesion in the right anterior renal pelvis.  There was also a second ureteral tumor fungating from the right ureteral orifice extending into the distal ureter also consistent with high-grade invasive urothelial carcinoma.  Muscle invasion could not be assessed.  He also underwent a CT chest which showed a rounded nodule in the right upper lobe measuring 14 mm concerning for metastases.  2 other 3 mm lesions were also noted in the left upper lobe likely benign  Plan initially was neoadjuvant chemotherapy followed by possible surgery but given the presence of lung lesion patient has been referred to oncology for the same.  Patient is here with his wife today and reports feeling well overall except for mild fatigue.  He denies any blood in his urine presently.  His bowel movements  are regular.  Denies any pain  ECOG PS- 1  Pain scale- 0   Review of systems- Review of Systems  Constitutional: Positive for malaise/fatigue. Negative for chills, fever and weight loss.  HENT: Negative for congestion, ear discharge and nosebleeds.   Eyes: Negative for blurred vision.  Respiratory: Negative for cough, hemoptysis, sputum production, shortness of breath and wheezing.   Cardiovascular: Negative for chest pain, palpitations, orthopnea and claudication.  Gastrointestinal: Negative for abdominal pain, blood in stool, constipation, diarrhea, heartburn, melena, nausea and vomiting.  Genitourinary: Negative for dysuria, flank pain, frequency, hematuria and urgency.  Musculoskeletal: Negative for back pain, joint pain and myalgias.  Skin: Negative for rash.  Neurological: Negative for dizziness, tingling, focal weakness, seizures, weakness and headaches.  Endo/Heme/Allergies: Does not bruise/bleed easily.  Psychiatric/Behavioral: Negative for depression and suicidal ideas. The patient does not have insomnia.     Allergies  Allergen Reactions  . Demerol [Meperidine] Nausea And Vomiting  . Lipitor [Atorvastatin] Swelling  . Sulfa Antibiotics Nausea And Vomiting and Rash    Patient Active Problem List   Diagnosis Date Noted  . Abdominal pain   . Emesis   . UTI (lower urinary tract infection) 12/26/2014  . Hyponatremia 12/26/2014  . HTN (hypertension) 12/26/2014  . HLD (hyperlipidemia) 12/26/2014  . BPH (benign prostatic hyperplasia) 12/26/2014  . Glaucoma 12/26/2014  . CKD (chronic kidney disease), stage III ([place]) 12/26/2014  . H/O adenomatous polyp of colon 10/07/2013  . Benign fibroma of prostate 08/23/2013  . Allergy to environmental factors 08/23/2013  . History of migraine headaches 08/23/2013  . Calculus of kidney 08/23/2013  . Head revolving around 08/23/2013  Past Medical History:  Diagnosis Date  . Arthritis   . Benign fibroma of prostate 08/23/2013    . BPH (benign prostatic hyperplasia)   . Calculus of kidney 08/23/2013  . Cancer [facility])    bladder cancer and skin cancer  . Glaucoma    no drops in 3 mo pressure good, pt denies glaucoma, eye pressure has been measuring alright.  . History of kidney stones   . HLD (hyperlipidemia)   . HOH (hard of hearing)    Left Hearing Aid  . HTN (hypertension) 12/26/2014  . Hypertension   . Hyponatremia 12/26/2014  . Migraines    history of migraines when he was younger.  [name] Restless leg syndrome   . Sinus drainage   . UTI (lower urinary tract infection) 12/26/2014  . Vertigo      Past Surgical History:  Procedure Laterality Date  . COLONOSCOPY    . CYSTOSCOPY W/ RETROGRADES Right 01/30/2015   Procedure: CYSTOSCOPY WITH RETROGRADE PYELOGRAM;  Surgeon: [name], MD;  Location: [facility];  Service: Urology;  Laterality: Right;  . CYSTOSCOPY W/ RETROGRADES Bilateral 02/26/2016   Procedure: CYSTOSCOPY WITH RETROGRADE PYELOGRAM;  Surgeon: [name], MD;  Location: [facility];  Service: Urology;  Laterality: Bilateral;  . CYSTOSCOPY W/ URETERAL STENT PLACEMENT Right 08/20/2015   Procedure: CYSTOSCOPY WITH RETROGRADE PYELOGRAM/POSSIBLE URETERAL STENT PLACEMENT/BLADDER BIOPSY;  Surgeon: [name], MD;  Location: [facility];  Service: Urology;  Laterality: Right;  . CYSTOSCOPY W/ URETERAL STENT PLACEMENT Right 09/12/2015   Procedure: CYSTOSCOPY WITH STENT REPLACEMENT;  Surgeon: [name], MD;  Location: [facility];  Service: Urology;  Laterality: Right;  . CYSTOSCOPY WITH BIOPSY Right 09/12/2015   Procedure: CYSTOSCOPY WITH BLADDER AND URETERAL BIOPSY;  Surgeon: [name], MD;  Location: [facility];  Service: Urology;  Laterality: Right;  . CYSTOSCOPY WITH STENT PLACEMENT Right 01/30/2015   Procedure: CYSTOSCOPY WITH STENT PLACEMENT;  Surgeon: [name], MD;  Location: [facility];  Service: Urology;  Laterality: Right;  . CYSTOSCOPY/URETEROSCOPY/HOLMIUM LASER/STENT PLACEMENT Right  08/19/2017   Procedure: CYSTOSCOPY/URETEROSCOPY/HOLMIUM LASER/STENT PLACEMENT;  Surgeon: [name], MD;  Location: [facility];  Service: Urology;  Laterality: Right;  . EYE SURGERY Bilateral    Cataract Extraction with IOL  . HOLMIUM LASER APPLICATION N/A 05/23/1759   Procedure:  HOLMIUM LASER APPLICATION;  Surgeon: [name], MD;  Location: [facility];  Service: Urology;  Laterality: N/A;  . SPERMATOCELECTOMY    . TONSILLECTOMY    . TRANSURETHRAL RESECTION OF BLADDER TUMOR N/A 02/26/2016   Procedure: TRANSURETHRAL RESECTION OF BLADDER TUMOR (TURBT);  Surgeon: [name], MD;  Location: [facility];  Service: Urology;  Laterality: N/A;  . TRANSURETHRAL RESECTION OF BLADDER TUMOR WITH MITOMYCIN-C N/A 09/12/2015   Procedure: TRANSURETHRAL RESECTION OF BLADDER TUMOR ;  Surgeon: [name], MD;  Location: [facility];  Service: Urology;  Laterality: N/A;  . TRANSURETHRAL RESECTION OF BLADDER TUMOR WITH MITOMYCIN-C N/A 03/24/2016   Procedure: TRANSURETHRAL RESECTION OF BLADDER TUMOR WITH MITOMYCIN-C  (SMALL);  Surgeon: [name], MD;  Location: [facility];  Service: Urology;  Laterality: N/A;  . URETERAL BIOPSY Right 08/19/2017   Procedure: Renal Mass BIOPSY;  Surgeon: [name], MD;  Location: [facility];  Service: Urology;  Laterality: Right;  . URETEROSCOPY Right 01/30/2015   Procedure: URETEROSCOPY/ WITH BIOPSY AND CYTOLOGY BRUSHING;  Surgeon: [name], MD;  Location: [facility];  Service: Urology;  Laterality: Right;  . URETEROSCOPY Right 08/20/2015   Procedure: URETEROSCOPY;  Surgeon: [name], MD;  Location: [facility];  Service: Urology;  Laterality: Right;  . URETEROSCOPY Right 09/12/2015   Procedure: URETEROSCOPY;  Surgeon: [name], MD;  Location: [facility];  Service: Urology;  Laterality: Right;  . URETEROSCOPY Right 02/26/2016   Procedure: URETEROSCOPY;  Surgeon: [name], MD;  Location: [facility];  Service: Urology;  Laterality: Right;    Social History    Socioeconomic History  . Marital status: Married    Spouse name: Not on file  . Number of children: Not on file  . Years of education: Not on file  . Highest education level: Not on file  Occupational History  . Not on file  Social Needs  . Financial resource strain: Not on file  . Food insecurity:    Worry: Not on file    Inability: Not on file  . Transportation needs:    Medical: Not on file    Non-medical: Not on file  Tobacco Use  . Smoking status: Never Smoker  . Smokeless tobacco: Never Used  Substance and Sexual Activity  . Alcohol use: No    Alcohol/week: 0.0 oz  . Drug use: No  . Sexual activity: Not on file  Lifestyle  . Physical activity:    Days per week: Not on file    Minutes per session: Not on file  . Stress: Not on file  Relationships  . Social connections:    Talks on phone: Not on file    Gets together: Not on file    Attends religious service: Not on file    Active member of club or organization: Not on file    Attends meetings of clubs or organizations: Not on file    Relationship status: Not on file  . Intimate partner violence:    Fear of current or ex partner: Not on file    Emotionally abused: Not on file    Physically abused: Not on file    Forced sexual activity: Not on file  Other Topics Concern  . Not on file  Social History Narrative  . Not on file     Family History  Problem Relation Age of Onset  . Hypertension Mother   . Hypertension Father   . Prostate cancer Brother   . Kidney disease Neg Hx   . Kidney cancer Neg Hx   . Bladder Cancer Neg Hx      Current Outpatient Medications:  .  acetaminophen (TYLENOL) 500 MG tablet, Take 1,000 mg by mouth daily as needed for moderate pain. , Disp: , Rfl:  .  benazepril-hydrochlorthiazide (LOTENSIN HCT) 20-12.5 MG tablet, Take 1 tablet by mouth daily. , Disp: , Rfl:  .  diazepam (VALIUM) 2 MG tablet, Take 2 mg by mouth every 12 (twelve) hours as needed for anxiety., Disp: , Rfl:   .  docusate sodium (COLACE) 100 MG capsule, Take 100 mg by mouth daily as needed for mild constipation., Disp: , Rfl:  .  fenofibrate micronized (LOFIBRA) 134 MG capsule, Take 134 mg by mouth daily before breakfast. , Disp: , Rfl:  .  fluticasone (FLONASE) 50 MCG/ACT nasal spray, Place 2 sprays into both nostrils at bedtime., Disp: , Rfl:  .  HYDROcodone-acetaminophen (NORCO/VICODIN) 5-325 MG tablet, Take 1-2 tablets by mouth every 6 (six) hours as needed for moderate pain., Disp: 10 tablet, Rfl: 0 .  hydrocortisone cream 1 %, Apply 1 application topically daily as needed for itching., Disp: , Rfl:  .  Multiple Vitamin (MULTIVITAMIN WITH MINERALS) TABS tablet, Take 1 tablet by mouth daily.,  Disp: , Rfl:  .  Multiple Vitamins-Minerals (ICAPS AREDS 2) CAPS, Take 1 capsule by mouth 2 (two) times daily., Disp: , Rfl:  .  Potassium 99 MG TABS, Take 99 mg by mouth at bedtime., Disp: , Rfl:  .  psyllium (METAMUCIL) 58.6 % packet, Take 1 packet by mouth daily as needed (constipation). , Disp: , Rfl:  .  tamsulosin (FLOMAX) 0.4 MG CAPS capsule, Take 1 capsule (0.4 mg total) by mouth daily. (Patient taking differently: Take 0.4 mg by mouth every evening. ), Disp: 90 capsule, Rfl: 3   Physical exam:  Vitals:   09/15/17 1103 09/15/17 1109  BP:  127/79  Pulse:  60  Resp: 12   Temp:  98.4 F (36.9 C)  TempSrc:  Tympanic  Weight: 192 lb 11.2 oz (87.4 kg)   Height: 6' (1.829 m)    Physical Exam  Constitutional: He is oriented to person, place, and time. He appears well-developed and well-nourished.  HENT:  Head: Normocephalic and atraumatic.  Eyes: Pupils are equal, round, and reactive to light. EOM are normal.  Neck: Normal range of motion.  Cardiovascular: Normal rate, regular rhythm and normal heart sounds.  Pulmonary/Chest: Effort normal and breath sounds normal.  Abdominal: Soft. Bowel sounds are normal.  Neurological: He is alert and oriented to person, place, and time.  Skin: Skin is warm  and dry.       CMP Latest Ref Rng & Units 08/06/2017  Glucose 65 - 99 mg/dL -  BUN 6 - 20 mg/dL -  Creatinine 0.61 - 1.24 mg/dL 1.30(H)  Sodium 135 - 145 mmol/L -  Potassium 3.5 - 5.1 mmol/L -  Chloride 101 - 111 mmol/L -  CO2 22 - 32 mmol/L -  Calcium 8.9 - 10.3 mg/dL -  Total Protein 6.5 - 8.1 g/dL -  Total Bilirubin 0.3 - 1.2 mg/dL -  Alkaline Phos 38 - 126 U/L -  AST 15 - 41 U/L -  ALT 17 - 63 U/L -   CBC Latest Ref Rng & Units 09/04/2017  WBC 3.4 - 10.8 x10E3/uL 7.4  Hemoglobin 13.0 - 17.7 g/dL 14.1  Hematocrit 37.5 - 51.0 % 40.5  Platelets 150 - 450 x10E3/uL 282    No images are attached to the encounter.  Ct Chest W Contrast  Result Date: 09/04/2017 CLINICAL DATA:  New diagnosis renal cell carcinoma.  Staging exam EXAM: CT CHEST WITH CONTRAST TECHNIQUE: Multidetector CT imaging of the chest was performed during intravenous contrast administration. CONTRAST:  53m ISOVUE-300 IOPAMIDOL (ISOVUE-300) INJECTION 61% COMPARISON:  CT 08/06/2017 FINDINGS: Cardiovascular: Seen Coronary artery calcification and aortic atherosclerotic calcification. Mediastinum/Nodes: No axillary supraclavicular adenopathy no mediastinal hilar adenopathy. No pericardial effusion. Esophagus normal. Lungs/Pleura: Rounded nodule in the RIGHT upper lobe measures 14 mm (image 73/4). Small LEFT upper lobe nodule measures 2 mm (60/4). Similar small 3 mm LEFT lower lobe nodule central calcification (image 81/4. Upper Abdomen: Limited view of the liver, kidneys, pancreas are unremarkable. Normal adrenal glands. Is dependent gallstones noted. High-density material within the RIGHT renal collecting system. Adrenal glands normal. Musculoskeletal: No aggressive osseous lesion. IMPRESSION: The 1. Smooth contour nodule in the RIGHT upper lobe concerning for urinary tract metastatic lesion. 2. Two small LEFT lung nodules are favored benign. Electronically Signed   By: SSuzy BouchardM.D.   On: 09/04/2017 16:56     Assessment and plan- Patient is a 84y.o. male with newly diagnosed urothelial carcinoma likely stage IV cT2N0M1 with isolated lung metastases  I  have reviewed CT chest abdomen and pelvis images independently.  I discussed the results of the CT scan and the pathology with the patient in detail.  Given that he had high-grade urothelial carcinoma this is likely muscle invasive although muscle sample could not be obtained in the ureteral biopsy sample.  Despite his  age patient has a good performance status and is independent of his ADLs.  He has met with Dr. Ruffin Pyo in [place] for a second opinion on surgery but given the presence of his lung nodule surgery was not deemed to be an option  I would like to discuss his case at the tumor board this week and see if lung biopsy can be performed safely.  If lung biopsy reveals evidence of metastatic disease then there would be no role for surgery and stage IV disease.  If lung biopsy does not reveal malignancy then surgery could still be an option since patient is interested in pursuing surgery and would like to go to [name] for the same.  If the lung biopsy cannot be performed safely given the location, I would favor proceeding with chemotherapy and see how he responds.  If the lung lesion response to chemotherapy this was likely metastatic although it could not be proven and surgery would still not be an option at that point  In terms of chemotherapy, given his baseline CKD and age, I would not like to use cisplatin.  I would favor using gemcitabine at 800 mg/m along with carboplatin AUC 2 given 2 weeks on and one-week off for 6 cycles.  Discussed risks and benefits of chemotherapy including all but not limited to nausea, vomiting, fatigue, low blood counts, risk of infections and hospitalization.  Risk of peripheral neuropathy associated with carboplatin.  Patient understands and agrees to proceed.  Treatment will be given with the palliative intent  given the presence of lung nodule which is likely metastases.  However if the lung nodule is not metastatic chemotherapy would be potentially given with curative intent in the neoadjuvant setting with hopes of going for surgery in the future.  Patient and his wife had multiple insightful questions and all of them were answered to their satisfaction and he would like to proceed with chemotherapy  I will plan to get a port placed and schedule a chemo class for him and tentatively start chemotherapy in 7 to 10 days time.  If he is unable to tolerate chemotherapy well I will switch him to immunotherapy at that point.  I will also obtain companion diagnostic testing for FGFR mutation given FDA approved therapy erdafitinib available down the line. I will also try to obtain PDL1 testign if adequate sample is left after FGFR testing   Cancer Staging Urothelial carcinoma of distal ureter [facility]) Staging form: Renal Pelvis and Ureter, AJCC 8th Edition - Clinical stage from 09/15/2017: Stage IV (cT2, cN0, cM1) - Signed by Sindy Guadeloupe, MD on 09/17/2017     Thank you for this kind referral and the opportunity to participate in the care of this patient   Visit Diagnosis 1. Urothelial carcinoma of right distal ureter ([place])   2. Goals of care, counseling/discussion   3. 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[place])     Dr. Randa Evens, MD, MPH [facility] [phone] 09/17/2017 8:18 AM

## 2017-09-17 NOTE — Telephone Encounter (Signed)
Called patient to confirm PET appt for 09/18/17. Left appt details on v/m. Will call again before end of shift. Dr. Janese Banks will call patient this after to advise of PET required. Per Luann/Courtney, No Pre-Auth needed for PET.

## 2017-09-18 ENCOUNTER — Ambulatory Visit: Payer: Medicare HMO

## 2017-09-18 NOTE — Patient Instructions (Signed)
Gemcitabine injection What is this medicine? GEMCITABINE (jem SIT a been) is a chemotherapy drug. This medicine is used to treat many types of cancer like breast cancer, lung cancer, pancreatic cancer, and ovarian cancer. This medicine may be used for other purposes; ask your health care provider or pharmacist if you have questions. COMMON BRAND NAME(S): Gemzar What should I tell my health care provider before I take this medicine? They need to know if you have any of these conditions: -blood disorders -infection -kidney disease -liver disease -recent or ongoing radiation therapy -an unusual or allergic reaction to gemcitabine, other chemotherapy, other medicines, foods, dyes, or preservatives -pregnant or trying to get pregnant -breast-feeding How should I use this medicine? This drug is given as an infusion into a vein. It is administered in a hospital or clinic by a specially trained health care professional. Talk to your pediatrician regarding the use of this medicine in children. Special care may be needed. Overdosage: If you think you have taken too much of this medicine contact a poison control center or emergency room at once. NOTE: This medicine is only for you. Do not share this medicine with others. What if I miss a dose? It is important not to miss your dose. Call your doctor or health care professional if you are unable to keep an appointment. What may interact with this medicine? -medicines to increase blood counts like filgrastim, pegfilgrastim, sargramostim -some other chemotherapy drugs like cisplatin -vaccines Talk to your doctor or health care professional before taking any of these medicines: -acetaminophen -aspirin -ibuprofen -ketoprofen -naproxen This list may not describe all possible interactions. Give your health care provider a list of all the medicines, herbs, non-prescription drugs, or dietary supplements you use. Also tell them if you smoke, drink alcohol,  or use illegal drugs. Some items may interact with your medicine. What should I watch for while using this medicine? Visit your doctor for checks on your progress. This drug may make you feel generally unwell. This is not uncommon, as chemotherapy can affect healthy cells as well as cancer cells. Report any side effects. Continue your course of treatment even though you feel ill unless your doctor tells you to stop. In some cases, you may be given additional medicines to help with side effects. Follow all directions for their use. Call your doctor or health care professional for advice if you get a fever, chills or sore throat, or other symptoms of a cold or flu. Do not treat yourself. This drug decreases your body's ability to fight infections. Try to avoid being around people who are sick. This medicine may increase your risk to bruise or bleed. Call your doctor or health care professional if you notice any unusual bleeding. Be careful brushing and flossing your teeth or using a toothpick because you may get an infection or bleed more easily. If you have any dental work done, tell your dentist you are receiving this medicine. Avoid taking products that contain aspirin, acetaminophen, ibuprofen, naproxen, or ketoprofen unless instructed by your doctor. These medicines may hide a fever. Women should inform their doctor if they wish to become pregnant or think they might be pregnant. There is a potential for serious side effects to an unborn child. Talk to your health care professional or pharmacist for more information. Do not breast-feed an infant while taking this medicine. What side effects may I notice from receiving this medicine? Side effects that you should report to your doctor or health care professional as   soon as possible: -allergic reactions like skin rash, itching or hives, swelling of the face, lips, or tongue -low blood counts - this medicine may decrease the number of white blood cells,  red blood cells and platelets. You may be at increased risk for infections and bleeding. -signs of infection - fever or chills, cough, sore throat, pain or difficulty passing urine -signs of decreased platelets or bleeding - bruising, pinpoint red spots on the skin, black, tarry stools, blood in the urine -signs of decreased red blood cells - unusually weak or tired, fainting spells, lightheadedness -breathing problems -chest pain -mouth sores -nausea and vomiting -pain, swelling, redness at site where injected -pain, tingling, numbness in the hands or feet -stomach pain -swelling of ankles, feet, hands -unusual bleeding Side effects that usually do not require medical attention (report to your doctor or health care professional if they continue or are bothersome): -constipation -diarrhea -hair loss -loss of appetite -stomach upset This list may not describe all possible side effects. Call your doctor for medical advice about side effects. You may report side effects to FDA at 1-800-FDA-1088. Where should I keep my medicine? This drug is given in a hospital or clinic and will not be stored at home. NOTE: This sheet is a summary. It may not cover all possible information. If you have questions about this medicine, talk to your doctor, pharmacist, or health care provider.  2018 [employer] Standard (2007-07-06 18:45:54) Carboplatin injection What is this medicine? CARBOPLATIN (KAR boe pla tin) is a chemotherapy drug. It targets fast dividing cells, like cancer cells, and causes these cells to die. This medicine is used to treat ovarian cancer and many other cancers. This medicine may be used for other purposes; ask your health care provider or pharmacist if you have questions. COMMON BRAND NAME(S): Paraplatin What should I tell my health care provider before I take this medicine? They need to know if you have any of these conditions: -blood disorders -hearing problems -kidney  disease -recent or ongoing radiation therapy -an unusual or allergic reaction to carboplatin, cisplatin, other chemotherapy, other medicines, foods, dyes, or preservatives -pregnant or trying to get pregnant -breast-feeding How should I use this medicine? This drug is usually given as an infusion into a vein. It is administered in a hospital or clinic by a specially trained health care professional. Talk to your pediatrician regarding the use of this medicine in children. Special care may be needed. Overdosage: If you think you have taken too much of this medicine contact a poison control center or emergency room at once. NOTE: This medicine is only for you. Do not share this medicine with others. What if I miss a dose? It is important not to miss a dose. Call your doctor or health care professional if you are unable to keep an appointment. What may interact with this medicine? -medicines for seizures -medicines to increase blood counts like filgrastim, pegfilgrastim, sargramostim -some antibiotics like amikacin, gentamicin, neomycin, streptomycin, tobramycin -vaccines Talk to your doctor or health care professional before taking any of these medicines: -acetaminophen -aspirin -ibuprofen -ketoprofen -naproxen This list may not describe all possible interactions. Give your health care provider a list of all the medicines, herbs, non-prescription drugs, or dietary supplements you use. Also tell them if you smoke, drink alcohol, or use illegal drugs. Some items may interact with your medicine. What should I watch for while using this medicine? Your condition will be monitored carefully while you are receiving this medicine. You will need  important blood work done while you are taking this medicine. This drug may make you feel generally unwell. This is not uncommon, as chemotherapy can affect healthy cells as well as cancer cells. Report any side effects. Continue your course of treatment even  though you feel ill unless your doctor tells you to stop. In some cases, you may be given additional medicines to help with side effects. Follow all directions for their use. Call your doctor or health care professional for advice if you get a fever, chills or sore throat, or other symptoms of a cold or flu. Do not treat yourself. This drug decreases your body's ability to fight infections. Try to avoid being around people who are sick. This medicine may increase your risk to bruise or bleed. Call your doctor or health care professional if you notice any unusual bleeding. Be careful brushing and flossing your teeth or using a toothpick because you may get an infection or bleed more easily. If you have any dental work done, tell your dentist you are receiving this medicine. Avoid taking products that contain aspirin, acetaminophen, ibuprofen, naproxen, or ketoprofen unless instructed by your doctor. These medicines may hide a fever. Do not become pregnant while taking this medicine. Women should inform their doctor if they wish to become pregnant or think they might be pregnant. There is a potential for serious side effects to an unborn child. Talk to your health care professional or pharmacist for more information. Do not breast-feed an infant while taking this medicine. What side effects may I notice from receiving this medicine? Side effects that you should report to your doctor or health care professional as soon as possible: -allergic reactions like skin rash, itching or hives, swelling of the face, lips, or tongue -signs of infection - fever or chills, cough, sore throat, pain or difficulty passing urine -signs of decreased platelets or bleeding - bruising, pinpoint red spots on the skin, black, tarry stools, nosebleeds -signs of decreased red blood cells - unusually weak or tired, fainting spells, lightheadedness -breathing problems -changes in hearing -changes in vision -chest pain -high  blood pressure -low blood counts - This drug may decrease the number of white blood cells, red blood cells and platelets. You may be at increased risk for infections and bleeding. -nausea and vomiting -pain, swelling, redness or irritation at the injection site -pain, tingling, numbness in the hands or feet -problems with balance, talking, walking -trouble passing urine or change in the amount of urine Side effects that usually do not require medical attention (report to your doctor or health care professional if they continue or are bothersome): -hair loss -loss of appetite -metallic taste in the mouth or changes in taste This list may not describe all possible side effects. Call your doctor for medical advice about side effects. You may report side effects to FDA at 1-800-FDA-1088. Where should I keep my medicine? This drug is given in a hospital or clinic and will not be stored at home. NOTE: This sheet is a summary. It may not cover all possible information. If you have questions about this medicine, talk to your doctor, pharmacist, or health care provider.  2018 [employer]/Gold Standard (2007-06-01 14:38:05)

## 2017-09-21 ENCOUNTER — Ambulatory Visit
Admission: RE | Admit: 2017-09-21 | Discharge: 2017-09-21 | Disposition: A | Discharge status: Home / Self Care | Payer: Medicare HMO | Source: Ambulatory Visit | Attending: Oncology | Admitting: Oncology

## 2017-09-21 ENCOUNTER — Inpatient Hospital Stay: Payer: Medicare HMO

## 2017-09-21 DIAGNOSIS — K8020 Calculus of gallbladder without cholecystitis without obstruction: Secondary | ICD-10-CM | POA: Insufficient documentation

## 2017-09-21 DIAGNOSIS — I700 Atherosclerosis of aorta: Secondary | ICD-10-CM | POA: Diagnosis not present

## 2017-09-21 DIAGNOSIS — C669 Malignant neoplasm of unspecified ureter: Secondary | ICD-10-CM | POA: Diagnosis not present

## 2017-09-21 DIAGNOSIS — C775 Secondary and unspecified malignant neoplasm of intrapelvic lymph nodes: Secondary | ICD-10-CM | POA: Insufficient documentation

## 2017-09-21 DIAGNOSIS — R911 Solitary pulmonary nodule: Secondary | ICD-10-CM | POA: Insufficient documentation

## 2017-09-21 DIAGNOSIS — I2510 Atherosclerotic heart disease of native coronary artery without angina pectoris: Secondary | ICD-10-CM | POA: Diagnosis not present

## 2017-09-21 LAB — GLUCOSE, CAPILLARY: Glucose-Capillary: 92 mg/dL (ref 70–99)

## 2017-09-21 MED ORDER — FLUDEOXYGLUCOSE F - 18 (FDG) INJECTION
9.9000 | Freq: Once | INTRAVENOUS | Status: AC | PRN
  Administered 2017-09-21: 10.89 via INTRAVENOUS

## 2017-09-22 ENCOUNTER — Telehealth (INDEPENDENT_AMBULATORY_CARE_PROVIDER_SITE_OTHER): Payer: Self-pay

## 2017-09-22 ENCOUNTER — Encounter: Payer: Self-pay | Admitting: Oncology

## 2017-09-22 ENCOUNTER — Telehealth: Payer: Self-pay

## 2017-09-22 ENCOUNTER — Inpatient Hospital Stay: Payer: Medicare HMO

## 2017-09-22 ENCOUNTER — Other Ambulatory Visit (INDEPENDENT_AMBULATORY_CARE_PROVIDER_SITE_OTHER): Payer: Self-pay | Admitting: Vascular Surgery

## 2017-09-22 ENCOUNTER — Inpatient Hospital Stay (HOSPITAL_BASED_OUTPATIENT_CLINIC_OR_DEPARTMENT_OTHER): Payer: Medicare HMO | Admitting: Oncology

## 2017-09-22 ENCOUNTER — Other Ambulatory Visit: Payer: Self-pay

## 2017-09-22 VITALS — BP 119/75 | HR 78 | Temp 97.7°F | Resp 18 | Wt 191.0 lb

## 2017-09-22 DIAGNOSIS — C669 Malignant neoplasm of unspecified ureter: Secondary | ICD-10-CM

## 2017-09-22 DIAGNOSIS — Z5111 Encounter for antineoplastic chemotherapy: Secondary | ICD-10-CM | POA: Diagnosis not present

## 2017-09-22 DIAGNOSIS — C661 Malignant neoplasm of right ureter: Secondary | ICD-10-CM

## 2017-09-22 LAB — COMPREHENSIVE METABOLIC PANEL
ALBUMIN: 3.5 g/dL (ref 3.5–5.0)
ALT: 18 U/L (ref 0–44)
AST: 28 U/L (ref 15–41)
Alkaline Phosphatase: 51 U/L (ref 38–126)
Anion gap: 8 (ref 5–15)
BILIRUBIN TOTAL: 0.7 mg/dL (ref 0.3–1.2)
BUN: 23 mg/dL (ref 8–23)
CHLORIDE: 101 mmol/L (ref 98–111)
CO2: 26 mmol/L (ref 22–32)
CREATININE: 1.2 mg/dL (ref 0.61–1.24)
Calcium: 9 mg/dL (ref 8.9–10.3)
GFR calc Af Amer: >60 mL/min (ref >60)
GFR, EST NON AFRICAN AMERICAN: 54 mL/min — AB (ref >60)
GLUCOSE: 139 mg/dL — AB (ref 70–99)
Potassium: 4.1 mmol/L (ref 3.5–5.1)
Sodium: 135 mmol/L (ref 135–145)
TOTAL PROTEIN: 6.6 g/dL (ref 6.5–8.1)

## 2017-09-22 LAB — CBC WITH DIFFERENTIAL/PLATELET
Basophils Absolute: 0 10*3/uL (ref 0–0.1)
Basophils Relative: 1 %
EOS ABS: 0.2 10*3/uL (ref 0–0.7)
Eosinophils Relative: 4 %
HCT: 39.1 % — ABNORMAL LOW (ref 40.0–52.0)
Hemoglobin: 13.8 g/dL (ref 13.0–18.0)
LYMPHS ABS: 1.2 10*3/uL (ref 1.0–3.6)
LYMPHS PCT: 22 %
MCH: 31.4 pg (ref 26.0–34.0)
MCHC: 35.3 g/dL (ref 32.0–36.0)
MCV: 89.1 fL (ref 80.0–100.0)
MONO ABS: 0.5 10*3/uL (ref 0.2–1.0)
Monocytes Relative: 9 %
Neutro Abs: 3.7 10*3/uL (ref 1.4–6.5)
Neutrophils Relative %: 64 %
Platelets: 267 10*3/uL (ref 150–440)
RBC: 4.39 MIL/uL — AB (ref 4.40–5.90)
RDW: 13.8 % (ref 11.5–14.5)
WBC: 5.6 10*3/uL (ref 3.8–10.6)

## 2017-09-22 MED ORDER — PALONOSETRON HCL INJECTION 0.25 MG/5ML
0.2500 mg | Freq: Once | INTRAVENOUS | Status: AC
  Administered 2017-09-22: 0.25 mg via INTRAVENOUS
  Filled 2017-09-22: qty 5

## 2017-09-22 MED ORDER — SODIUM CHLORIDE 0.9 % IV SOLN
Freq: Once | INTRAVENOUS | Status: AC
  Administered 2017-09-22: 11:00:00 via INTRAVENOUS
  Filled 2017-09-22: qty 1000

## 2017-09-22 MED ORDER — SODIUM CHLORIDE 0.9 % IV SOLN
163.2000 mg | Freq: Once | INTRAVENOUS | Status: AC
  Administered 2017-09-22: 160 mg via INTRAVENOUS
  Filled 2017-09-22: qty 16

## 2017-09-22 MED ORDER — DEXAMETHASONE SODIUM PHOSPHATE 10 MG/ML IJ SOLN
10.0000 mg | Freq: Once | INTRAMUSCULAR | Status: AC
  Administered 2017-09-22: 10 mg via INTRAVENOUS
  Filled 2017-09-22: qty 1

## 2017-09-22 MED ORDER — SODIUM CHLORIDE 0.9% FLUSH
10.0000 mL | INTRAVENOUS | Status: DC | PRN
  Filled 2017-09-22: qty 10

## 2017-09-22 MED ORDER — SODIUM CHLORIDE 0.9 % IV SOLN: 10.0000 mg | Freq: Once | INTRAVENOUS | Status: DC

## 2017-09-22 MED ORDER — HEPARIN SOD (PORK) LOCK FLUSH 100 UNIT/ML IV SOLN: 500.0000 [IU] | Freq: Once | INTRAVENOUS | Status: DC

## 2017-09-22 MED ORDER — SODIUM CHLORIDE 0.9 % IV SOLN
1600.0000 mg | Freq: Once | INTRAVENOUS | Status: AC
  Administered 2017-09-22: 1600 mg via INTRAVENOUS
  Filled 2017-09-22: qty 26.3

## 2017-09-22 NOTE — Telephone Encounter (Signed)
Fax recd from [facility] for approval  Effective date 03/08/17- expiration 12/18/17.  For prescription medications.   Referral /auth number [id number]   MAY

## 2017-09-22 NOTE — Progress Notes (Signed)
Hematology/Oncology Consult note [facility]  Telephone:([phone] [phone]  Patient Care Team: Idelle Crouch, MD as PCP - General (Internal Medicine)   Name of the patient: Anthony Gonzales  [id number]  Oct 07, 1931   Date of visit: 09/22/17  Diagnosis- Metastatic urothelial carcinoma with possible mets to the obturator node. Indeterminate hilar lymph nodes and LUL lung nodule  Chief complaint/ Reason for visit- discuss PET/CT scan and on treatment assessment prior to cycle 1 of gemcitabine/ carboplatin  Heme/Onc history: patient is a 84 year old male with past medical history significant for hypertension and long-standing history of superficial bladder cancer for which he sees [name].  He has undergone TURBT as well as ureteroscopy since 2017 along with mitomycin as well in the past.  Most recently he underwent CT abdomen on 08/06/2017 which showed a soft tissue mass in the right renal pelvis concerning for upper tract urothelial neoplasm.  Soft tissue fullness at the right ureterovesical junction with proximal right hydroureteronephrosis.  Several millimeter attenuation lesion in the pancreatic head.  He underwent diagnostic ureteroscopy and was found to have 2 high-grade lesions within his right kidney.  There was a nodular high-grade appearing lesion in the right anterior renal pelvis.  There was also a second ureteral tumor fungating from the right ureteral orifice extending into the distal ureter also consistent with high-grade invasive urothelial carcinoma.  Muscle invasion could not be assessed.  He also underwent a CT chest which showed a rounded nodule in the right upper lobe measuring 14 mm concerning for metastases.  2 other 3 mm lesions were also noted in the left upper lobe likely benign  Plan initially was neoadjuvant chemotherapy followed by possible surgery but given the presence of lung lesion patient has been referred to  oncology for the same.  PET/CT on 09/21/17 showed: IMPRESSION: 1. Hypermetabolic right obturator lymph node is most indicative of metastatic disease. 2. Mildly hypermetabolic left hilar lymph nodes are nonspecific. Continued attention on follow-up exams is warranted. 3. Right upper lobe pulmonary nodule shows metabolism after just above blood pool and is therefore indeterminate. Continued attention on follow-up exams is warranted. 4. Aortic atherosclerosis (ICD10-170.0). Coronary artery calcification. 5. Cholelithiasis   Interval history- he feels well. His eyes were blood shot after pet ct scan yesterday and gradually improving today  ECOG PS- 1 Pain scale- 0 Opioid associated constipation- no  Review of systems- Review of Systems  Constitutional: Negative for chills, fever, malaise/fatigue and weight loss.  HENT: Negative for congestion, ear discharge and nosebleeds.   Eyes: Negative for blurred vision.  Respiratory: Negative for cough, hemoptysis, sputum production, shortness of breath and wheezing.   Cardiovascular: Negative for chest pain, palpitations, orthopnea and claudication.  Gastrointestinal: Negative for abdominal pain, blood in stool, constipation, diarrhea, heartburn, melena, nausea and vomiting.  Genitourinary: Negative for dysuria, flank pain, frequency, hematuria and urgency.  Musculoskeletal: Negative for back pain, joint pain and myalgias.  Skin: Negative for rash.  Neurological: Negative for dizziness, tingling, focal weakness, seizures, weakness and headaches.  Endo/Heme/Allergies: Does not bruise/bleed easily.  Psychiatric/Behavioral: Negative for depression and suicidal ideas. The patient does not have insomnia.       Allergies  Allergen Reactions  . Demerol [Meperidine] Nausea And Vomiting  . Lipitor [Atorvastatin] Swelling  . Sulfa Antibiotics Nausea And Vomiting and Rash     Past Medical History:  Diagnosis Date  . Arthritis   . Benign  fibroma of prostate 08/23/2013  . BPH (benign prostatic  hyperplasia)   . Calculus of kidney 08/23/2013  . Cancer [facility])    bladder cancer and skin cancer  . Glaucoma    no drops in 3 mo pressure good, pt denies glaucoma, eye pressure has been measuring alright.  . History of kidney stones   . HLD (hyperlipidemia)   . HOH (hard of hearing)    Left Hearing Aid  . HTN (hypertension) 12/26/2014  . Hypertension   . Hyponatremia 12/26/2014  . Migraines    history of migraines when he was younger.  [name] Restless leg syndrome   . Sinus drainage   . UTI (lower urinary tract infection) 12/26/2014  . Vertigo      Past Surgical History:  Procedure Laterality Date  . COLONOSCOPY    . CYSTOSCOPY W/ RETROGRADES Right 01/30/2015   Procedure: CYSTOSCOPY WITH RETROGRADE PYELOGRAM;  Surgeon: [name], MD;  Location: [facility] ORS;  Service: Urology;  Laterality: Right;  . CYSTOSCOPY W/ RETROGRADES Bilateral 02/26/2016   Procedure: CYSTOSCOPY WITH RETROGRADE PYELOGRAM;  Surgeon: [name], MD;  Location: [facility];  Service: Urology;  Laterality: Bilateral;  . CYSTOSCOPY W/ URETERAL STENT PLACEMENT Right 08/20/2015   Procedure: CYSTOSCOPY WITH RETROGRADE PYELOGRAM/POSSIBLE URETERAL STENT PLACEMENT/BLADDER BIOPSY;  Surgeon: [name], MD;  Location: [facility];  Service: Urology;  Laterality: Right;  . CYSTOSCOPY W/ URETERAL STENT PLACEMENT Right 09/12/2015   Procedure: CYSTOSCOPY WITH STENT REPLACEMENT;  Surgeon: [name], MD;  Location: [facility];  Service: Urology;  Laterality: Right;  . CYSTOSCOPY WITH BIOPSY Right 09/12/2015   Procedure: CYSTOSCOPY WITH BLADDER AND URETERAL BIOPSY;  Surgeon: [name], MD;  Location: [facility];  Service: Urology;  Laterality: Right;  . CYSTOSCOPY WITH STENT PLACEMENT Right 01/30/2015   Procedure: CYSTOSCOPY WITH STENT PLACEMENT;  Surgeon: [name], MD;  Location: [facility];  Service: Urology;  Laterality: Right;  . CYSTOSCOPY/URETEROSCOPY/HOLMIUM  LASER/STENT PLACEMENT Right 08/19/2017   Procedure: CYSTOSCOPY/URETEROSCOPY/HOLMIUM LASER/STENT PLACEMENT;  Surgeon: [name], MD;  Location: [facility];  Service: Urology;  Laterality: Right;  . EYE SURGERY Bilateral    Cataract Extraction with IOL  . HOLMIUM LASER APPLICATION N/A [id number]/41/[id number]   Procedure:  HOLMIUM LASER APPLICATION;  Surgeon: [name], MD;  Location: [facility];  Service: Urology;  Laterality: N/A;  . SPERMATOCELECTOMY    . TONSILLECTOMY    . TRANSURETHRAL RESECTION OF BLADDER TUMOR N/A 02/26/2016   Procedure: TRANSURETHRAL RESECTION OF BLADDER TUMOR (TURBT);  Surgeon: [name], MD;  Location: [facility];  Service: Urology;  Laterality: N/A;  . TRANSURETHRAL RESECTION OF BLADDER TUMOR WITH MITOMYCIN-C N/A 09/12/2015   Procedure: TRANSURETHRAL RESECTION OF BLADDER TUMOR ;  Surgeon: [name], MD;  Location: [facility];  Service: Urology;  Laterality: N/A;  . TRANSURETHRAL RESECTION OF BLADDER TUMOR WITH MITOMYCIN-C N/A 03/24/2016   Procedure: TRANSURETHRAL RESECTION OF BLADDER TUMOR WITH MITOMYCIN-C  (SMALL);  Surgeon: [name], MD;  Location: [facility];  Service: Urology;  Laterality: N/A;  . URETERAL BIOPSY Right 08/19/2017   Procedure: Renal Mass BIOPSY;  Surgeon: [name], MD;  Location: [facility];  Service: Urology;  Laterality: Right;  . URETEROSCOPY Right 01/30/2015   Procedure: URETEROSCOPY/ WITH BIOPSY AND CYTOLOGY BRUSHING;  Surgeon: [name], MD;  Location: [facility];  Service: Urology;  Laterality: Right;  . URETEROSCOPY Right 08/20/2015   Procedure: URETEROSCOPY;  Surgeon: [name], MD;  Location: [facility];  Service: Urology;  Laterality: Right;  . URETEROSCOPY Right 09/12/2015   Procedure: URETEROSCOPY;  Surgeon: [name], MD;  Location: [facility];  Service: Urology;  Laterality: Right;  . URETEROSCOPY Right 02/26/2016   Procedure: URETEROSCOPY;  Surgeon: [name], MD;  Location: [facility];  Service: Urology;  Laterality:  Right;    Social History   Socioeconomic History  . Marital status: Married    Spouse name: Not on file  . Number of children: Not on file  . Years of education: Not on file  . Highest education level: Not on file  Occupational History  . Not on file  Social Needs  . Financial resource strain: Not on file  . Food insecurity:    Worry: Not on file    Inability: Not on file  . Transportation needs:    Medical: Not on file    Non-medical: Not on file  Tobacco Use  . Smoking status: Never Smoker  . Smokeless tobacco: Never Used  Substance and Sexual Activity  . Alcohol use: No    Alcohol/week: 0.0 oz  . Drug use: No  . Sexual activity: Not on file  Lifestyle  . Physical activity:    Days per week: Not on file    Minutes per session: Not on file  . Stress: Not on file  Relationships  . Social connections:    Talks on phone: Not on file    Gets together: Not on file    Attends religious service: Not on file    Active member of club or organization: Not on file    Attends meetings of clubs or organizations: Not on file    Relationship status: Not on file  . Intimate partner violence:    Fear of current or ex partner: Not on file    Emotionally abused: Not on file    Physically abused: Not on file    Forced sexual activity: Not on file  Other Topics Concern  . Not on file  Social History Narrative  . Not on file    Family History  Problem Relation Age of Onset  . Hypertension Mother   . Hypertension Father   . Prostate cancer Brother   . Kidney disease Neg Hx   . Kidney cancer Neg Hx   . Bladder Cancer Neg Hx      Current Outpatient Medications:  .  benazepril-hydrochlorthiazide (LOTENSIN HCT) 20-12.5 MG tablet, Take 1 tablet by mouth daily. , Disp: , Rfl:  .  dexamethasone (DECADRON) 4 MG tablet, Take 2 tablets (8 mg total) by mouth daily. Start the day after carboplatin chemotherapy for 2 days., Disp: 30 tablet, Rfl: 1 .  diazepam (VALIUM) 2 MG tablet,  Take 2 mg by mouth every 12 (twelve) hours as needed for anxiety., Disp: , Rfl:  .  docusate sodium (COLACE) 100 MG capsule, Take 100 mg by mouth daily as needed for mild constipation., Disp: , Rfl:  .  fenofibrate micronized (LOFIBRA) 134 MG capsule, Take 134 mg by mouth daily before breakfast. , Disp: , Rfl:  .  fluticasone (FLONASE) 50 MCG/ACT nasal spray, Place 2 sprays into both nostrils at bedtime., Disp: , Rfl:  .  hydrocortisone cream 1 %, Apply 1 application topically daily as needed for itching., Disp: , Rfl:  .  Multiple Vitamin (MULTIVITAMIN WITH MINERALS) TABS tablet, Take 1 tablet by mouth daily., Disp: , Rfl:  .  Multiple Vitamins-Minerals (ICAPS AREDS 2) CAPS, Take 1 capsule by mouth 2 (two) times daily., Disp: , Rfl:  .  Potassium 99 MG TABS, Take 99 mg by mouth at bedtime., Disp: , Rfl:  .  psyllium (METAMUCIL) 58.6 % packet, Take 1 packet by mouth daily as needed (constipation). , Disp: , Rfl:  .  tamsulosin (FLOMAX) 0.4 MG CAPS capsule, Take 1 capsule (0.4 mg total) by mouth daily. (Patient taking differently: Take 0.4 mg by mouth every evening. ), Disp: 90 capsule, Rfl: 3 .  acetaminophen (TYLENOL) 500 MG tablet, Take 1,000 mg by mouth daily as needed for moderate pain. , Disp: , Rfl:  .  HYDROcodone-acetaminophen (NORCO/VICODIN) 5-325 MG tablet, Take 1-2 tablets by mouth every 6 (six) hours as needed for moderate pain. (Patient not taking: Reported on 09/22/2017), Disp: 10 tablet, Rfl: 0 .  lidocaine-prilocaine (EMLA) cream, Apply to affected area once (Patient not taking: Reported on 09/22/2017), Disp: 30 g, Rfl: 3 .  LORazepam (ATIVAN) 0.5 MG tablet, Take 1 tablet (0.5 mg total) by mouth every 6 (six) hours as needed (Nausea or vomiting). (Patient not taking: Reported on 09/22/2017), Disp: 30 tablet, Rfl: 0 .  ondansetron (ZOFRAN) 8 MG tablet, Take 1 tablet (8 mg total) by mouth 2 (two) times daily as needed for refractory nausea / vomiting. Start on day 3 after carboplatin chemo.  (Patient not taking: Reported on 09/22/2017), Disp: 30 tablet, Rfl: 1 .  prochlorperazine (COMPAZINE) 10 MG tablet, Take 1 tablet (10 mg total) by mouth every 6 (six) hours as needed (Nausea or vomiting). (Patient not taking: Reported on 09/22/2017), Disp: 30 tablet, Rfl: 1 No current facility-administered medications for this visit.   Facility-Administered Medications Ordered in Other Visits:  .  heparin lock flush 100 unit/mL, 500 Units, Intravenous, Once, Randa Evens C, MD .  sodium chloride flush (NS) 0.9 % injection 10 mL, 10 mL, Intravenous, PRN, Sindy Guadeloupe, MD  Physical exam:  Vitals:   09/22/17 0929  BP: 119/75  Pulse: 78  Resp: 18  Temp: 97.7 F (36.5 C)  TempSrc: Tympanic  Weight: 191 lb (86.6 kg)   Physical Exam  Constitutional: He is oriented to person, place, and time. He appears well-developed and well-nourished.  HENT:  Head: Normocephalic and atraumatic.  Mild conjunctival injection  Eyes: Pupils are equal, round, and reactive to light. EOM are normal.  Neck: Normal range of motion.  Cardiovascular: Normal rate, regular rhythm and normal heart sounds.  Pulmonary/Chest: Effort normal and breath sounds normal.  Abdominal: Soft. Bowel sounds are normal.  Neurological: He is alert and oriented to person, place, and time.  Skin: Skin is warm and dry.     CMP Latest Ref Rng & Units 09/22/2017  Glucose 70 - 99 mg/dL 139(H)  BUN 8 - 23 mg/dL 23  Creatinine 0.61 - 1.24 mg/dL 1.20  Sodium 135 - 145 mmol/L 135  Potassium 3.5 - 5.1 mmol/L 4.1  Chloride 98 - 111 mmol/L 101  CO2 22 - 32 mmol/L 26  Calcium 8.9 - 10.3 mg/dL 9.0  Total Protein 6.5 - 8.1 g/dL 6.6  Total Bilirubin 0.3 - 1.2 mg/dL 0.7  Alkaline Phos 38 - 126 U/L 51  AST 15 - 41 U/L 28  ALT 0 - 44 U/L 18   CBC Latest Ref Rng & Units 09/22/2017  WBC 3.8 - 10.6 K/uL 5.6  Hemoglobin 13.0 - 18.0 g/dL 13.8  Hematocrit 40.0 - 52.0 % 39.1(L)  Platelets 150 - 440 K/uL 267    No images are attached to the  encounter.  Ct Chest W Contrast  Result Date: 09/04/2017 CLINICAL DATA:  New diagnosis renal cell carcinoma.  Staging exam EXAM: CT CHEST WITH CONTRAST TECHNIQUE: Multidetector CT  imaging of the chest was performed during intravenous contrast administration. CONTRAST:  24mL ISOVUE-300 IOPAMIDOL (ISOVUE-300) INJECTION 61% COMPARISON:  CT 08/06/2017 FINDINGS: Cardiovascular: Seen Coronary artery calcification and aortic atherosclerotic calcification. Mediastinum/Nodes: No axillary supraclavicular adenopathy no mediastinal hilar adenopathy. No pericardial effusion. Esophagus normal. Lungs/Pleura: Rounded nodule in the RIGHT upper lobe measures 14 mm (image 73/4). Small LEFT upper lobe nodule measures 2 mm (60/4). Similar small 3 mm LEFT lower lobe nodule central calcification (image 81/4. Upper Abdomen: Limited view of the liver, kidneys, pancreas are unremarkable. Normal adrenal glands. Is dependent gallstones noted. High-density material within the RIGHT renal collecting system. Adrenal glands normal. Musculoskeletal: No aggressive osseous lesion. IMPRESSION: The 1. Smooth contour nodule in the RIGHT upper lobe concerning for urinary tract metastatic lesion. 2. Two small LEFT lung nodules are favored benign. Electronically Signed   By: Suzy Bouchard M.D.   On: 09/04/2017 16:56   Nm Pet Image Initial (pi) Skull Base To Thigh  Result Date: 09/21/2017 CLINICAL DATA:  Initial treatment strategy for urothelial carcinoma. EXAM: NUCLEAR MEDICINE PET SKULL BASE TO THIGH TECHNIQUE: 10.9 mCi F-18 FDG was injected intravenously. Full-ring PET imaging was performed from the skull base to thigh after the radiotracer. CT data was obtained and used for attenuation correction and anatomic localization. Fasting blood glucose: 92 mg/dl COMPARISON:  CT chest 09/04/2017 and CT abdomen pelvis 08/06/2017. FINDINGS: Mediastinal blood pool activity: SUV max 2.3 NECK: No hypermetabolic lymph nodes in the neck. Incidental CT  findings: None. CHEST: Mildly hypermetabolic left hilar lymph nodes measure up to 10 mm (CT image 101) with an SUV max of 4.1. No hypermetabolic mediastinal or axillary lymph nodes. A 1.3 x 1.5 cm nodule in the medial right upper lobe (series 3, image 102) shows metabolism around blood pool (SUV max 2.5). Incidental CT findings: Atherosclerotic calcification of the arterial vasculature, including coronary arteries. Heart is mildly enlarged. No pericardial or pleural effusion. ABDOMEN/PELVIS: No abnormal hypermetabolism in the liver, adrenal glands, spleen or pancreas. Right obturator lymph node measures 1.6 cm (CT image 147) with an SUV max of 6.8. No additional hypermetabolic lymph nodes. Incidental CT findings: Atherosclerotic calcification of the arterial vasculature without abdominal aortic aneurysm. Stones are seen in the gallbladder. Double-J right ureteral stent is in place SKELETON: No abnormal osseous hypermetabolism. Incidental CT findings: None. IMPRESSION: 1. Hypermetabolic right obturator lymph node is most indicative of metastatic disease. 2. Mildly hypermetabolic left hilar lymph nodes are nonspecific. Continued attention on follow-up exams is warranted. 3. Right upper lobe pulmonary nodule shows metabolism after just above blood pool and is therefore indeterminate. Continued attention on follow-up exams is warranted. 4. Aortic atherosclerosis (ICD10-170.0). Coronary artery calcification. 5. Cholelithiasis. Electronically Signed   By: Lorin Picket M.D.   On: 09/21/2017 14:55     Assessment and plan- Patient is a 84 y.o. male with newly diagnosed urothelial carcinoma likely stage IV [id number] with metastases to obturator LN. Left hilar and RUL lung nodule is indeterminate  I have personally reviewed PET/CT images independently and discussed findings with the patient and his wife. In adddition to high grade urothelial carcinoma noted in the renal pelvis and distal ureter, PET Ct demonstrates  uptake in the obturator Ln concerning for metastatic disease. The RUL seen on CT chest was not PET AVID above the surrounding blood pool and etiology is uncertain. He also has some uptake in the right hilar nodes which is of uncertain etiology.  However given the presence of obturator LN, iw ill proceed with gemcitabine  at 800 mg/meter square and carboplatin at auc 2  2 weeks on and 1 week off for 6 cycles. If he tolerates first couple of cycles well, I will consider switching to cisplatin. I will repeat scans after 3 cycles. Chemotherapy is being given with a palliative intent starting today  I will see him back in 1 weeks time for cycle 1 day 8 of gemcitabine/ carboplatin  I will hold off on lung biopsy at this time. Depending on repeat scans, we will see if surgery is an option in the future. Less likely given PET/Ct findings. He has an appointment with [name] in october  He will get chemo through peripheral IV today. Hopefully he will port placed within 1 week prior to his next chemo    Visit Diagnosis 1. 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[place])      Dr. Randa Evens, MD, MPH [facility] [phone] 09/22/2017 1:25 PM

## 2017-09-22 NOTE — Telephone Encounter (Signed)
I attempted to contact again and left a message for return call.

## 2017-09-22 NOTE — Telephone Encounter (Signed)
Attempted to contact the patient to schedule his port placement and I had to leave a message for a return call.

## 2017-09-22 NOTE — Progress Notes (Signed)
Here for follow up. Pt concerned that noone has called him for port placement . Per pt "had CT yesterday " ( pet?? ) -pt not sure. Anxious this am .

## 2017-09-23 ENCOUNTER — Encounter
Admission: RE | Disposition: A | Discharge status: Home / Self Care | Payer: Self-pay | Source: Ambulatory Visit | Attending: Vascular Surgery

## 2017-09-23 ENCOUNTER — Ambulatory Visit
Admission: RE | Admit: 2017-09-23 | Discharge: 2017-09-23 | Disposition: A | Discharge status: Home / Self Care | Payer: Medicare HMO | Source: Ambulatory Visit | Attending: Vascular Surgery | Admitting: Vascular Surgery

## 2017-09-23 ENCOUNTER — Encounter: Payer: Self-pay | Admitting: Oncology

## 2017-09-23 ENCOUNTER — Encounter: Payer: Self-pay | Admitting: *Deleted

## 2017-09-23 DIAGNOSIS — Z960 Presence of urogenital implants: Secondary | ICD-10-CM | POA: Insufficient documentation

## 2017-09-23 DIAGNOSIS — Z8042 Family history of malignant neoplasm of prostate: Secondary | ICD-10-CM | POA: Insufficient documentation

## 2017-09-23 DIAGNOSIS — H409 Unspecified glaucoma: Secondary | ICD-10-CM | POA: Insufficient documentation

## 2017-09-23 DIAGNOSIS — C669 Malignant neoplasm of unspecified ureter: Secondary | ICD-10-CM | POA: Insufficient documentation

## 2017-09-23 DIAGNOSIS — I10 Essential (primary) hypertension: Secondary | ICD-10-CM | POA: Diagnosis not present

## 2017-09-23 DIAGNOSIS — Z79899 Other long term (current) drug therapy: Secondary | ICD-10-CM | POA: Insufficient documentation

## 2017-09-23 DIAGNOSIS — Z9889 Other specified postprocedural states: Secondary | ICD-10-CM | POA: Insufficient documentation

## 2017-09-23 DIAGNOSIS — Z8249 Family history of ischemic heart disease and other diseases of the circulatory system: Secondary | ICD-10-CM | POA: Insufficient documentation

## 2017-09-23 DIAGNOSIS — E785 Hyperlipidemia, unspecified: Secondary | ICD-10-CM | POA: Diagnosis not present

## 2017-09-23 DIAGNOSIS — Z87440 Personal history of urinary (tract) infections: Secondary | ICD-10-CM | POA: Insufficient documentation

## 2017-09-23 DIAGNOSIS — Z7951 Long term (current) use of inhaled steroids: Secondary | ICD-10-CM | POA: Diagnosis not present

## 2017-09-23 DIAGNOSIS — Z9841 Cataract extraction status, right eye: Secondary | ICD-10-CM | POA: Insufficient documentation

## 2017-09-23 DIAGNOSIS — Z9842 Cataract extraction status, left eye: Secondary | ICD-10-CM | POA: Diagnosis not present

## 2017-09-23 DIAGNOSIS — C680 Malignant neoplasm of urethra: Secondary | ICD-10-CM

## 2017-09-23 DIAGNOSIS — G2581 Restless legs syndrome: Secondary | ICD-10-CM | POA: Diagnosis not present

## 2017-09-23 DIAGNOSIS — N400 Enlarged prostate without lower urinary tract symptoms: Secondary | ICD-10-CM | POA: Insufficient documentation

## 2017-09-23 DIAGNOSIS — Z87442 Personal history of urinary calculi: Secondary | ICD-10-CM | POA: Diagnosis not present

## 2017-09-23 HISTORY — PX: PORTA CATH INSERTION: CATH118285

## 2017-09-23 LAB — SURGICAL PATHOLOGY

## 2017-09-23 SURGERY — PORTA CATH INSERTION
Anesthesia: Moderate Sedation

## 2017-09-23 MED ORDER — MIDAZOLAM HCL 2 MG/2ML IJ SOLN
INTRAMUSCULAR | Status: DC | PRN
  Administered 2017-09-23 (×2): 1 mg via INTRAVENOUS
  Administered 2017-09-23: 2 mg via INTRAVENOUS

## 2017-09-23 MED ORDER — ONDANSETRON HCL 4 MG/2ML IJ SOLN
INTRAMUSCULAR | Status: AC
  Filled 2017-09-23: qty 2

## 2017-09-23 MED ORDER — FENTANYL CITRATE (PF) 100 MCG/2ML IJ SOLN
INTRAMUSCULAR | Status: AC
  Filled 2017-09-23: qty 2

## 2017-09-23 MED ORDER — LIDOCAINE-EPINEPHRINE (PF) 1 %-1:200000 IJ SOLN
INTRAMUSCULAR | Status: AC
  Filled 2017-09-23: qty 30

## 2017-09-23 MED ORDER — SODIUM CHLORIDE 0.9 % IV SOLN
Freq: Once | INTRAVENOUS | Status: AC
  Administered 2017-09-23: 15:00:00
  Filled 2017-09-23: qty 80

## 2017-09-23 MED ORDER — CEFAZOLIN SODIUM-DEXTROSE 2-4 GM/100ML-% IV SOLN: 2.0000 g | Freq: Once | INTRAVENOUS | Status: DC

## 2017-09-23 MED ORDER — HEPARIN (PORCINE) IN NACL 1000-0.9 UT/500ML-% IV SOLN
INTRAVENOUS | Status: AC
  Filled 2017-09-23: qty 500

## 2017-09-23 MED ORDER — SODIUM CHLORIDE 0.9 % IV SOLN
INTRAVENOUS | Status: DC
  Administered 2017-09-23: 14:00:00 via INTRAVENOUS

## 2017-09-23 MED ORDER — MIDAZOLAM HCL 5 MG/5ML IJ SOLN
INTRAMUSCULAR | Status: AC
  Filled 2017-09-23: qty 5

## 2017-09-23 MED ORDER — ONDANSETRON HCL 4 MG/2ML IJ SOLN
4.0000 mg | Freq: Four times a day (QID) | INTRAMUSCULAR | Status: DC | PRN
  Administered 2017-09-23: 4 mg via INTRAVENOUS

## 2017-09-23 MED ORDER — CEFAZOLIN SODIUM-DEXTROSE 2-4 GM/100ML-% IV SOLN
INTRAVENOUS | Status: AC
  Administered 2017-09-23: 15:00:00
  Filled 2017-09-23: qty 100

## 2017-09-23 MED ORDER — HYDROMORPHONE HCL 1 MG/ML IJ SOLN: 1.0000 mg | Freq: Once | INTRAMUSCULAR | Status: DC | PRN

## 2017-09-23 MED ORDER — FENTANYL CITRATE (PF) 100 MCG/2ML IJ SOLN
INTRAMUSCULAR | Status: DC | PRN
  Administered 2017-09-23: 25 ug via INTRAVENOUS
  Administered 2017-09-23: 50 ug via INTRAVENOUS

## 2017-09-23 SURGICAL SUPPLY — 8 items
KIT PORT POWER 8FR ISP CVUE (Port) ×3 IMPLANT
PACK ANGIOGRAPHY (CUSTOM PROCEDURE TRAY) ×3 IMPLANT
PAD GROUND ADULT SPLIT (MISCELLANEOUS) ×3 IMPLANT
PENCIL ELECTRO HAND CTR (MISCELLANEOUS) ×3 IMPLANT
SUT MNCRL AB 4-0 PS2 18 (SUTURE) ×3 IMPLANT
SUT PROLENE 0 CT 1 30 (SUTURE) ×3 IMPLANT
SUT VIC AB 3-0 SH 27 (SUTURE) ×2
SUT VIC AB 3-0 SH 27X BRD (SUTURE) ×1 IMPLANT

## 2017-09-23 NOTE — Op Note (Addendum)
      [facility]       Operative Note  Date: 09/23/2017  Preoperative diagnosis:  1. Urothelial cancer  Postoperative diagnosis:  Same as above  Procedures: #1. Ultrasound guidance for vascular access to the right internal jugular vein. #2. Fluoroscopic guidance for placement of catheter. #3. Placement of CT compatible Port-A-Cath, right internal jugular vein.  Surgeon: Leotis Pain, MD.   Anesthesia: Local with moderate conscious sedation for approximately 20  minutes using 4 mg of Versed and 75 mcg of Fentanyl  Fluoroscopy time: less than 1 minute  Contrast used: 0  Estimated blood loss: 10 cc  Indication for the procedure:  The patient is a 84 y.o.male with urothelial cancer.  The patient needs a Port-A-Cath for durable venous access, chemotherapy, lab draws, and CT scans. We are asked to place this. Risks and benefits were discussed and informed consent was obtained.  Description of procedure: The patient was brought to the vascular and interventional radiology suite.  Moderate conscious sedation was administered throughout the procedure during a face to face encounter with the patient with my supervision of the RN administering medicines and monitoring the patient's vital signs, pulse oximetry, telemetry and mental status throughout from the start of the procedure until the patient was taken to the recovery room. The right neck chest and shoulder were sterilely prepped and draped, and a sterile surgical field was created. Ultrasound was used to help visualize a patent right internal jugular vein. This was then accessed under direct ultrasound guidance without difficulty with the Seldinger needle and a permanent image was recorded. A J-wire was placed. After skin nick and dilatation, the peel-away sheath was then placed over the wire. I then anesthetized an area under the clavicle approximately 1-2 fingerbreadths. A transverse incision was created and an inferior  pocket was created with electrocautery and blunt dissection. The port was then brought onto the field, placed into the pocket and secured to the chest wall with 2 Prolene sutures. The catheter was connected to the port and tunneled from the subclavicular incision to the access site. Fluoroscopic guidance was then used to cut the catheter to an appropriate length. The catheter was then placed through the peel-away sheath and the peel-away sheath was removed. The catheter tip was parked in excellent location under fluorocoscopic guidance in the SVC just above the right atrium. The pocket was then irrigated with antibiotic impregnated saline and the wound was closed with a running 3-0 Vicryl and a 4-0 Monocryl. The access incision was closed with a single 4-0 Monocryl. The Huber needle was used to withdraw blood and flush the port with heparinized saline. Dermabond was then placed as a dressing. The patient tolerated the procedure well and was taken to the recovery room in stable condition.   Leotis Pain 09/23/2017 3:16 PM   This note was created with Dragon Medical transcription system. Any errors in dictation are purely unintentional.

## 2017-09-23 NOTE — H&P (Signed)
[facility] History & Physical Update  The patient was interviewed and re-examined.  The patient's previous History and Physical has been reviewed and is unchanged.  There is no change in the plan of care. We plan to proceed with the scheduled procedure.  Leotis Pain, MD  09/23/2017, 1:47 PM

## 2017-09-24 ENCOUNTER — Encounter: Payer: Self-pay | Admitting: Vascular Surgery

## 2017-09-24 ENCOUNTER — Ambulatory Visit: Payer: Medicare HMO | Admitting: Internal Medicine

## 2017-09-24 VITALS — BP 122/60 | HR 85 | Ht 72.0 in | Wt 195.0 lb

## 2017-09-24 DIAGNOSIS — R911 Solitary pulmonary nodule: Secondary | ICD-10-CM | POA: Diagnosis not present

## 2017-09-24 NOTE — Patient Instructions (Signed)
Follow up as needed

## 2017-09-24 NOTE — Progress Notes (Signed)
No I did not. Thanks! Astrid Divine

## 2017-09-24 NOTE — Progress Notes (Signed)
Name: Anthony Gonzales MRN: [id number] DOB: Jan 23, 1932     CONSULTATION DATE:  REFERRING MD :   CHIEF COMPLAINT: abnormal CT chest  STUDIES:      6.28.19 CT chest Independently reviewed by Me RUL nodule noted on images  HISTORY OF PRESENT ILLNESS: 84 year old male with past medical history significant for hypertension and long-standing history of superficial bladder cancer for which he sees [name].   Most recently he underwent CT abdomen on 08/06/2017 which showed a soft tissue mass in the right renal pelvis concerning for upper tract urothelial neoplasm.   Subsequently CT chest shows RUL nodule that is NOT PET AVID CT findings reviewed with patient   The Risks and Benefits of the Bronchoscopy procedure with ENB were explained to patient/family.  I have discussed the risk for Acute Bleeding, increased chance of Infection, increased chance of Respiratory Failure and Cardiac Arrest, Stroke and Death.  I have also explained to avoid all types of NSAIDs 1 week prior to procedure date  to decrease chance of bleeding, and also to avoid food and drinks the midnight prior to procedure.  The procedure consists of a video camera with a light source to be placed and inserted  into the lungs to  look for abnormal tissue and to obtain tissue samples by using needle and biopsy tools.  The patient/family understand the risks and benefits and have agreed  NOT TO  proceed with procedure.   Case discussed with Dr Janese Banks while patient was in office plan is for therapy and re-assess nodule as needed  PAST MEDICAL HISTORY :   has a past medical history of Arthritis, Benign fibroma of prostate (08/23/2013), BPH (benign prostatic hyperplasia), Calculus of kidney (08/23/2013), Cancer ([place]), Glaucoma, History of kidney stones, HLD (hyperlipidemia), HOH (hard of hearing), HTN (hypertension) (12/26/2014), Hypertension, Hyponatremia (12/26/2014), Migraines, Restless leg syndrome, Sinus drainage, UTI  (lower urinary tract infection) (12/26/2014), and Vertigo.  has a past surgical history that includes Spermatocelectomy; Colonoscopy; Ureteroscopy (Right, 01/30/2015); Cystoscopy w/ retrogrades (Right, 01/30/2015); Cystoscopy with stent placement (Right, 01/30/2015); Ureteroscopy (Right, 08/20/2015); Cystoscopy w/ ureteral stent placement (Right, 08/20/2015); Holmium laser application (N/A, 9/56/2130); Tonsillectomy; Eye surgery (Bilateral); Transurethral resection of bladder tumor with mitomycin-c (N/A, 09/12/2015); Ureteroscopy (Right, 09/12/2015); Cystoscopy with biopsy (Right, 09/12/2015); Cystoscopy w/ ureteral stent placement (Right, 09/12/2015); Transurethral resection of bladder tumor (N/A, 02/26/2016); Cystoscopy w/ retrogrades (Bilateral, 02/26/2016); Ureteroscopy (Right, 02/26/2016); Transurethral resection of bladder tumor with mitomycin-c (N/A, 03/24/2016); Cystoscopy/ureteroscopy/holmium laser/stent placement (Right, 08/19/2017); Ureteral biopsy (Right, 08/19/2017); goiter removal; and PORTA CATH INSERTION (N/A, 09/23/2017). Prior to Admission medications   Medication Sig Start Date End Date Taking? Authorizing Provider  acetaminophen (TYLENOL) 500 MG tablet Take 1,000 mg by mouth daily as needed for moderate pain.    Yes [provider]  benazepril-hydrochlorthiazide (LOTENSIN HCT) 20-12.5 MG tablet Take 1 tablet by mouth daily.    Yes [provider]  dexamethasone (DECADRON) 4 MG tablet Take 2 tablets (8 mg total) by mouth daily. Start the day after carboplatin chemotherapy for 2 days. 09/17/17  Yes Sindy Guadeloupe, MD  diazepam (VALIUM) 2 MG tablet Take 2 mg by mouth every 12 (twelve) hours as needed for anxiety.   Yes [provider]  docusate sodium (COLACE) 100 MG capsule Take 100 mg by mouth daily as needed for mild constipation.   Yes [provider]  fenofibrate micronized (LOFIBRA) 134 MG capsule Take 134 mg by mouth daily before breakfast.  04/08/14  Yes  [provider]  fluticasone (FLONASE) 50 MCG/ACT nasal spray Place 2 sprays into both nostrils at bedtime.   Yes [provider]  HYDROcodone-acetaminophen (NORCO/VICODIN) 5-325 MG tablet Take 1-2 tablets by mouth every 6 (six) hours as needed for moderate pain. 08/19/17  Yes [name], MD  hydrocortisone cream 1 % Apply 1 application topically daily as needed for itching.   Yes [provider]  lidocaine-prilocaine (EMLA) cream Apply to affected area once 09/17/17  Yes Sindy Guadeloupe, MD  LORazepam (ATIVAN) 0.5 MG tablet Take 1 tablet (0.5 mg total) by mouth every 6 (six) hours as needed (Nausea or vomiting). 09/17/17  Yes Sindy Guadeloupe, MD  Multiple Vitamin (MULTIVITAMIN WITH MINERALS) TABS tablet Take 1 tablet by mouth daily.   Yes [provider]  Multiple Vitamins-Minerals (ICAPS AREDS 2) CAPS Take 1 capsule by mouth 2 (two) times daily.   Yes [provider]  ondansetron (ZOFRAN) 8 MG tablet Take 1 tablet (8 mg total) by mouth 2 (two) times daily as needed for refractory nausea / vomiting. Start on day 3 after carboplatin chemo. 09/17/17  Yes Sindy Guadeloupe, MD  Potassium 99 MG TABS Take 99 mg by mouth at bedtime.   Yes [provider]  prochlorperazine (COMPAZINE) 10 MG tablet Take 1 tablet (10 mg total) by mouth every 6 (six) hours as needed (Nausea or vomiting). 09/17/17  Yes Sindy Guadeloupe, MD  psyllium (METAMUCIL) 58.6 % packet Take 1 packet by mouth daily as needed (constipation).    Yes [provider]  tamsulosin (FLOMAX) 0.4 MG CAPS capsule Take 1 capsule (0.4 mg total) by mouth daily. Patient taking differently: Take 0.4 mg by mouth every evening.  03/27/17  Yes McGowan, Larene Beach A, PA-C   Allergies  Allergen Reactions  . Demerol [Meperidine] Nausea And Vomiting  . Lipitor [Atorvastatin] Swelling  . Sulfa Antibiotics Nausea And Vomiting and Rash    FAMILY HISTORY:  family history includes Hypertension in his father  and mother; Prostate cancer in his brother. SOCIAL HISTORY:  reports that he has never smoked. He has never used smokeless tobacco. He reports that he does not drink alcohol or use drugs.  REVIEW OF SYSTEMS:   Constitutional: Negative for fever, chills, weight loss, malaise/fatigue and diaphoresis.  HENT: Negative for hearing loss, ear pain, nosebleeds, congestion, sore throat, neck pain, tinnitus and ear discharge.   Eyes: Negative for blurred vision, double vision, photophobia, pain, discharge and redness.  Respiratory: Negative for cough, hemoptysis, sputum production, shortness of breath, wheezing and stridor.   Cardiovascular: Negative for chest pain, palpitations, orthopnea, claudication, leg swelling and PND.  Gastrointestinal: Negative for heartburn, nausea, vomiting, abdominal pain, diarrhea, constipation, blood in stool and melena.  Genitourinary: Negative for dysuria, urgency, frequency, hematuria and flank pain.  Musculoskeletal: Negative for myalgias, back pain, joint pain and falls.  Skin: Negative for itching and rash.  Neurological: Negative for dizziness, tingling, tremors, sensory change, speech change, focal weakness, seizures, loss of consciousness, weakness and headaches.  Endo/Heme/Allergies: Negative for environmental allergies and polydipsia. Does not bruise/bleed easily.  ALL OTHER ROS ARE NEGATIVE  BP 122/60 (BP Location: Left Arm, Cuff Size: Normal)   Pulse 85   Ht 6' (1.829 m)   Wt 195 lb (88.5 kg)   SpO2 93%   BMI 26.45 kg/m   Physical Examination:   GENERAL:NAD, no fevers, chills, no weakness no fatigue HEAD: Normocephalic, atraumatic.  EYES: Pupils equal, round, reactive to light. Extraocular muscles intact. No scleral icterus.  MOUTH: Moist  mucosal membrane.   EAR, NOSE, THROAT: Clear without exudates. No external lesions.  NECK: Supple. No thyromegaly. No nodules. No JVD.  PULMONARY:CTA B/L no wheezes, no crackles, no rhonchi CARDIOVASCULAR: S1 and  S2. Regular rate and rhythm. No murmurs, rubs, or gallops. No edema.  GASTROINTESTINAL: Soft, nontender, nondistended. No masses. Positive bowel sounds.  MUSCULOSKELETAL: No swelling, clubbing, or edema. Range of motion full in all extremities.  NEUROLOGIC: Cranial nerves II through XII are intact. No gross focal neurological deficits.  SKIN: No ulceration, lesions, rashes, or cyanosis. Skin warm and dry. Turgor intact.  PSYCHIATRIC: Mood, affect within normal limits. The patient is awake, alert and oriented x 3. Insight, judgment intact.      ASSESSMENT / PLAN: 84 yo pleasant WM with bladder and kidney cancer with abnormal CT chest with RUL nodule  Plan is to continue chemo therapy and assess nodule at later date/ time according to Dr Janese Banks with On[facility]    Patient/Family are satisfied with Plan of action and management. All questions answered  Corrin Parker, M.D.  [facility]  Medical Director [place] Director [facility] Cardio-Pulmonary Department

## 2017-09-29 ENCOUNTER — Encounter: Payer: Self-pay | Admitting: Oncology

## 2017-09-29 ENCOUNTER — Inpatient Hospital Stay: Payer: Medicare HMO

## 2017-09-29 ENCOUNTER — Inpatient Hospital Stay (HOSPITAL_BASED_OUTPATIENT_CLINIC_OR_DEPARTMENT_OTHER): Payer: Medicare HMO | Admitting: Oncology

## 2017-09-29 VITALS — BP 124/66 | HR 65 | Temp 97.3°F | Resp 18 | Ht 72.0 in | Wt 192.3 lb

## 2017-09-29 DIAGNOSIS — C669 Malignant neoplasm of unspecified ureter: Secondary | ICD-10-CM

## 2017-09-29 DIAGNOSIS — C661 Malignant neoplasm of right ureter: Secondary | ICD-10-CM | POA: Diagnosis not present

## 2017-09-29 DIAGNOSIS — T451X5A Adverse effect of antineoplastic and immunosuppressive drugs, initial encounter: Secondary | ICD-10-CM

## 2017-09-29 DIAGNOSIS — Z8042 Family history of malignant neoplasm of prostate: Secondary | ICD-10-CM

## 2017-09-29 DIAGNOSIS — R918 Other nonspecific abnormal finding of lung field: Secondary | ICD-10-CM | POA: Diagnosis not present

## 2017-09-29 DIAGNOSIS — D701 Agranulocytosis secondary to cancer chemotherapy: Secondary | ICD-10-CM

## 2017-09-29 DIAGNOSIS — Z5111 Encounter for antineoplastic chemotherapy: Secondary | ICD-10-CM | POA: Diagnosis not present

## 2017-09-29 DIAGNOSIS — Z8551 Personal history of malignant neoplasm of bladder: Secondary | ICD-10-CM | POA: Diagnosis not present

## 2017-09-29 LAB — CBC WITH DIFFERENTIAL/PLATELET
Basophils Absolute: 0 10*3/uL (ref 0–0.1)
Basophils Relative: 1 %
Eosinophils Absolute: 0.1 10*3/uL (ref 0–0.7)
Eosinophils Relative: 3 %
HCT: 37 % — ABNORMAL LOW (ref 40.0–52.0)
Hemoglobin: 12.6 g/dL — ABNORMAL LOW (ref 13.0–18.0)
LYMPHS ABS: 0.8 10*3/uL — AB (ref 1.0–3.6)
LYMPHS PCT: 44 %
MCH: 30.5 pg (ref 26.0–34.0)
MCHC: 34.1 g/dL (ref 32.0–36.0)
MCV: 89.4 fL (ref 80.0–100.0)
MONO ABS: 0.1 10*3/uL — AB (ref 0.2–1.0)
MONOS PCT: 7 %
NEUTROS PCT: 45 %
Neutro Abs: 0.9 10*3/uL — ABNORMAL LOW (ref 1.4–6.5)
PLATELETS: 155 10*3/uL (ref 150–440)
RBC: 4.14 MIL/uL — ABNORMAL LOW (ref 4.40–5.90)
RDW: 13.1 % (ref 11.5–14.5)
WBC: 1.9 10*3/uL — ABNORMAL LOW (ref 3.8–10.6)

## 2017-09-29 LAB — BASIC METABOLIC PANEL
ANION GAP: 7 (ref 5–15)
BUN: 21 mg/dL (ref 8–23)
CHLORIDE: 97 mmol/L — AB (ref 98–111)
CO2: 26 mmol/L (ref 22–32)
Calcium: 8.7 mg/dL — ABNORMAL LOW (ref 8.9–10.3)
Creatinine, Ser: 1.08 mg/dL (ref 0.61–1.24)
GFR calc Af Amer: >60 mL/min (ref >60)
GFR calc non Af Amer: >60 mL/min (ref >60)
GLUCOSE: 123 mg/dL — AB (ref 70–99)
POTASSIUM: 4.2 mmol/L (ref 3.5–5.1)
Sodium: 130 mmol/L — ABNORMAL LOW (ref 135–145)

## 2017-09-29 MED ORDER — HEPARIN SOD (PORK) LOCK FLUSH 100 UNIT/ML IV SOLN
500.0000 [IU] | Freq: Once | INTRAVENOUS | Status: AC
  Administered 2017-09-29: 500 [IU] via INTRAVENOUS

## 2017-09-29 MED ORDER — SODIUM CHLORIDE 0.9% FLUSH
10.0000 mL | Freq: Once | INTRAVENOUS | Status: AC
  Administered 2017-09-29: 10 mL via INTRAVENOUS
  Filled 2017-09-29: qty 10

## 2017-09-29 NOTE — Progress Notes (Signed)
No new changes noted today 

## 2017-10-01 NOTE — Progress Notes (Signed)
Hematology/Oncology Consult note [facility]  Telephone:([phone] [phone]  Patient Care Team: Idelle Crouch, MD as PCP - General (Internal Medicine)   Name of the patient: Anthony Gonzales  [id number]  Feb 04, 1932   Date of visit: 10/01/17  Diagnosis- Metastatic urothelial carcinoma with possible mets to the obturator node. Indeterminate hilar lymph nodes and LUL lung nodule    Chief complaint/ Reason for visit- on treatment assessment prior to cycle 1 day 8 of carbo/ gemzar  Heme/Onc history: patient is a 84 year old male with past medical history significant for hypertension and long-standing history of superficial bladder cancer for which he sees [name]. He has undergone TURBT as well as ureteroscopy since 2017 along with mitomycin as well in the past. Most recently he underwent CT abdomen on 08/06/2017 which showed a soft tissue mass in the right renal pelvis concerning for upper tract urothelial neoplasm. Soft tissue fullness at the right ureterovesical junction with proximal right hydroureteronephrosis. Several millimeter attenuation lesion in the pancreatic head.  He underwent diagnostic ureteroscopy and was found to have 2 high-grade lesions within his right kidney. There was a nodular high-grade appearing lesion in the right anterior renal pelvis. There was also a second ureteral tumor fungating from the right ureteral orifice extending into the distal ureter also consistent with high-grade invasive urothelial carcinoma. Muscle invasion could not be assessed. He also underwent a CT chest which showed a rounded nodule in the right upper lobe measuring 14 mm concerning for metastases. 2 other 3 mm lesions were also noted in the left upper lobe likely benign  Plan initially was neoadjuvant chemotherapy followed by possible surgery but given the presence of lung lesion patient has been referred to oncology for the  same.  PET/CT on 09/21/17 showed: IMPRESSION: 1. Hypermetabolic right obturator lymph node is most indicative of metastatic disease. 2. Mildly hypermetabolic left hilar lymph nodes are nonspecific. Continued attention on follow-up exams is warranted. 3. Right upper lobe pulmonary nodule shows metabolism after just above blood pool and is therefore indeterminate. Continued attention on follow-up exams is warranted. 4. Aortic atherosclerosis (ICD10-170.0). Coronary artery calcification. 5. Cholelithiasis   Plan is to proceed with 4-6 cycles of carboplatin/ gemzar 2 week on and 1 week off  Interval history- he tolerated cycle 1 of chemotherapy well. No fever, nausea or fatigue. He denies any complaints today  ECOG PS- 1 Pain scale- 0 Opioid associated constipation- no  Review of systems- Review of Systems  Constitutional: Negative for chills, fever, malaise/fatigue and weight loss.  HENT: Negative for congestion, ear discharge and nosebleeds.   Eyes: Negative for blurred vision.  Respiratory: Negative for cough, hemoptysis, sputum production, shortness of breath and wheezing.   Cardiovascular: Negative for chest pain, palpitations, orthopnea and claudication.  Gastrointestinal: Negative for abdominal pain, blood in stool, constipation, diarrhea, heartburn, melena, nausea and vomiting.  Genitourinary: Negative for dysuria, flank pain, frequency, hematuria and urgency.  Musculoskeletal: Negative for back pain, joint pain and myalgias.  Skin: Negative for rash.  Neurological: Negative for dizziness, tingling, focal weakness, seizures, weakness and headaches.  Endo/Heme/Allergies: Does not bruise/bleed easily.  Psychiatric/Behavioral: Negative for depression and suicidal ideas. The patient does not have insomnia.       Allergies  Allergen Reactions  . Demerol [Meperidine] Nausea And Vomiting  . Lipitor [Atorvastatin] Swelling  . Sulfa Antibiotics Nausea And Vomiting and Rash      Past Medical History:  Diagnosis Date  . Arthritis   . Benign  fibroma of prostate 08/23/2013  . BPH (benign prostatic hyperplasia)   . Calculus of kidney 08/23/2013  . Cancer [facility])    bladder cancer and skin cancer  . Glaucoma    no drops in 3 mo pressure good, pt denies glaucoma, eye pressure has been measuring alright.  . History of kidney stones   . HLD (hyperlipidemia)   . HOH (hard of hearing)    Left Hearing Aid  . HTN (hypertension) 12/26/2014  . Hypertension   . Hyponatremia 12/26/2014  . Migraines    history of migraines when he was younger.  [name] Restless leg syndrome   . Sinus drainage   . UTI (lower urinary tract infection) 12/26/2014  . Vertigo      Past Surgical History:  Procedure Laterality Date  . COLONOSCOPY    . CYSTOSCOPY W/ RETROGRADES Right 01/30/2015   Procedure: CYSTOSCOPY WITH RETROGRADE PYELOGRAM;  Surgeon: [name], MD;  Location: [facility];  Service: Urology;  Laterality: Right;  . CYSTOSCOPY W/ RETROGRADES Bilateral 02/26/2016   Procedure: CYSTOSCOPY WITH RETROGRADE PYELOGRAM;  Surgeon: [name], MD;  Location: [facility];  Service: Urology;  Laterality: Bilateral;  . CYSTOSCOPY W/ URETERAL STENT PLACEMENT Right 08/20/2015   Procedure: CYSTOSCOPY WITH RETROGRADE PYELOGRAM/POSSIBLE URETERAL STENT PLACEMENT/BLADDER BIOPSY;  Surgeon: [name], MD;  Location: [facility];  Service: Urology;  Laterality: Right;  . CYSTOSCOPY W/ URETERAL STENT PLACEMENT Right 09/12/2015   Procedure: CYSTOSCOPY WITH STENT REPLACEMENT;  Surgeon: [name], MD;  Location: [facility];  Service: Urology;  Laterality: Right;  . CYSTOSCOPY WITH BIOPSY Right 09/12/2015   Procedure: CYSTOSCOPY WITH BLADDER AND URETERAL BIOPSY;  Surgeon: [name], MD;  Location: [facility];  Service: Urology;  Laterality: Right;  . CYSTOSCOPY WITH STENT PLACEMENT Right 01/30/2015   Procedure: CYSTOSCOPY WITH STENT PLACEMENT;  Surgeon: [name], MD;  Location: [facility];   Service: Urology;  Laterality: Right;  . CYSTOSCOPY/URETEROSCOPY/HOLMIUM LASER/STENT PLACEMENT Right 08/19/2017   Procedure: CYSTOSCOPY/URETEROSCOPY/HOLMIUM LASER/STENT PLACEMENT;  Surgeon: [name], MD;  Location: [facility];  Service: Urology;  Laterality: Right;  . EYE SURGERY Bilateral    Cataract Extraction with IOL  . goiter removal    . HOLMIUM LASER APPLICATION N/A 2/95/6213   Procedure:  HOLMIUM LASER APPLICATION;  Surgeon: [name], MD;  Location: [facility];  Service: Urology;  Laterality: N/A;  . PORTA CATH INSERTION N/A 09/23/2017   Procedure: PORTA CATH INSERTION;  Surgeon: Algernon Huxley, MD;  Location: [facility];  Service: Cardiovascular;  Laterality: N/A;  . SPERMATOCELECTOMY    . TONSILLECTOMY    . TRANSURETHRAL RESECTION OF BLADDER TUMOR N/A 02/26/2016   Procedure: TRANSURETHRAL RESECTION OF BLADDER TUMOR (TURBT);  Surgeon: [name], MD;  Location: [facility];  Service: Urology;  Laterality: N/A;  . TRANSURETHRAL RESECTION OF BLADDER TUMOR WITH MITOMYCIN-C N/A 09/12/2015   Procedure: TRANSURETHRAL RESECTION OF BLADDER TUMOR ;  Surgeon: [name], MD;  Location: [facility];  Service: Urology;  Laterality: N/A;  . TRANSURETHRAL RESECTION OF BLADDER TUMOR WITH MITOMYCIN-C N/A 03/24/2016   Procedure: TRANSURETHRAL RESECTION OF BLADDER TUMOR WITH MITOMYCIN-C  (SMALL);  Surgeon: [name], MD;  Location: [facility];  Service: Urology;  Laterality: N/A;  . URETERAL BIOPSY Right 08/19/2017   Procedure: Renal Mass BIOPSY;  Surgeon: [name], MD;  Location: [facility];  Service: Urology;  Laterality: Right;  . URETEROSCOPY Right 01/30/2015   Procedure: URETEROSCOPY/ WITH BIOPSY AND CYTOLOGY BRUSHING;  Surgeon: [name], MD;  Location: [facility];  Service:  Urology;  Laterality: Right;  . URETEROSCOPY Right 08/20/2015   Procedure: URETEROSCOPY;  Surgeon: [name], MD;  Location: [facility];  Service: Urology;  Laterality: Right;  . URETEROSCOPY Right  09/12/2015   Procedure: URETEROSCOPY;  Surgeon: [name], MD;  Location: [facility];  Service: Urology;  Laterality: Right;  . URETEROSCOPY Right 02/26/2016   Procedure: URETEROSCOPY;  Surgeon: [name], MD;  Location: [facility];  Service: Urology;  Laterality: Right;    Social History   Socioeconomic History  . Marital status: Married    Spouse name: Not on file  . Number of children: Not on file  . Years of education: Not on file  . Highest education level: Not on file  Occupational History  . Not on file  Social Needs  . Financial resource strain: Not on file  . Food insecurity:    Worry: Not on file    Inability: Not on file  . Transportation needs:    Medical: Not on file    Non-medical: Not on file  Tobacco Use  . Smoking status: Never Smoker  . Smokeless tobacco: Never Used  Substance and Sexual Activity  . Alcohol use: No    Alcohol/week: 0.0 oz  . Drug use: No  . Sexual activity: Not on file  Lifestyle  . Physical activity:    Days per week: Not on file    Minutes per session: Not on file  . Stress: Not on file  Relationships  . Social connections:    Talks on phone: Not on file    Gets together: Not on file    Attends religious service: Not on file    Active member of club or organization: Not on file    Attends meetings of clubs or organizations: Not on file    Relationship status: Not on file  . Intimate partner violence:    Fear of current or ex partner: Not on file    Emotionally abused: Not on file    Physically abused: Not on file    Forced sexual activity: Not on file  Other Topics Concern  . Not on file  Social History Narrative  . Not on file    Family History  Problem Relation Age of Onset  . Hypertension Mother   . Hypertension Father   . Prostate cancer Brother   . Kidney disease Neg Hx   . Kidney cancer Neg Hx   . Bladder Cancer Neg Hx      Current Outpatient Medications:  .  benazepril-hydrochlorthiazide (LOTENSIN HCT)  20-12.5 MG tablet, Take 1 tablet by mouth daily. , Disp: , Rfl:  .  fenofibrate micronized (LOFIBRA) 134 MG capsule, Take 134 mg by mouth daily before breakfast. , Disp: , Rfl:  .  Multiple Vitamin (MULTIVITAMIN WITH MINERALS) TABS tablet, Take 1 tablet by mouth daily., Disp: , Rfl:  .  Multiple Vitamins-Minerals (ICAPS AREDS 2) CAPS, Take 1 capsule by mouth 2 (two) times daily., Disp: , Rfl:  .  Potassium 99 MG TABS, Take 99 mg by mouth at bedtime., Disp: , Rfl:  .  tamsulosin (FLOMAX) 0.4 MG CAPS capsule, Take 1 capsule (0.4 mg total) by mouth daily. (Patient taking differently: Take 0.4 mg by mouth every evening. ), Disp: 90 capsule, Rfl: 3 .  acetaminophen (TYLENOL) 500 MG tablet, Take 1,000 mg by mouth daily as needed for moderate pain. , Disp: , Rfl:  .  dexamethasone (DECADRON) 4 MG tablet, Take 2 tablets (8 mg total) by mouth  daily. Start the day after carboplatin chemotherapy for 2 days. (Patient not taking: Reported on 09/29/2017), Disp: 30 tablet, Rfl: 1 .  diazepam (VALIUM) 2 MG tablet, Take 2 mg by mouth every 12 (twelve) hours as needed for anxiety., Disp: , Rfl:  .  docusate sodium (COLACE) 100 MG capsule, Take 100 mg by mouth daily as needed for mild constipation., Disp: , Rfl:  .  fluticasone (FLONASE) 50 MCG/ACT nasal spray, Place 2 sprays into both nostrils at bedtime., Disp: , Rfl:  .  HYDROcodone-acetaminophen (NORCO/VICODIN) 5-325 MG tablet, Take 1-2 tablets by mouth every 6 (six) hours as needed for moderate pain. (Patient not taking: Reported on 09/29/2017), Disp: 10 tablet, Rfl: 0 .  hydrocortisone cream 1 %, Apply 1 application topically daily as needed for itching., Disp: , Rfl:  .  lidocaine-prilocaine (EMLA) cream, Apply to affected area once (Patient not taking: Reported on 09/29/2017), Disp: 30 g, Rfl: 3 .  LORazepam (ATIVAN) 0.5 MG tablet, Take 1 tablet (0.5 mg total) by mouth every 6 (six) hours as needed (Nausea or vomiting). (Patient not taking: Reported on 09/29/2017),  Disp: 30 tablet, Rfl: 0 .  ondansetron (ZOFRAN) 8 MG tablet, Take 1 tablet (8 mg total) by mouth 2 (two) times daily as needed for refractory nausea / vomiting. Start on day 3 after carboplatin chemo. (Patient not taking: Reported on 09/29/2017), Disp: 30 tablet, Rfl: 1 .  prochlorperazine (COMPAZINE) 10 MG tablet, Take 1 tablet (10 mg total) by mouth every 6 (six) hours as needed (Nausea or vomiting). (Patient not taking: Reported on 09/29/2017), Disp: 30 tablet, Rfl: 1 .  psyllium (METAMUCIL) 58.6 % packet, Take 1 packet by mouth daily as needed (constipation). , Disp: , Rfl:   Physical exam:  Vitals:   09/29/17 0951  BP: 124/66  Pulse: 65  Resp: 18  Temp: (!) 97.3 F (36.3 C)  TempSrc: Tympanic  SpO2: 94%  Weight: 192 lb 4.8 oz (87.2 kg)  Height: 6' (1.829 m)   Physical Exam  Constitutional: He is oriented to person, place, and time. He appears well-developed and well-nourished.  HENT:  Head: Normocephalic and atraumatic.  Eyes: Pupils are equal, round, and reactive to light. EOM are normal.  Neck: Normal range of motion.  Cardiovascular: Normal rate, regular rhythm and normal heart sounds.  Pulmonary/Chest: Effort normal and breath sounds normal.  Abdominal: Soft. Bowel sounds are normal.  Neurological: He is alert and oriented to person, place, and time.  Skin: Skin is warm and dry.     CMP Latest Ref Rng & Units 09/29/2017  Glucose 70 - 99 mg/dL 123(H)  BUN 8 - 23 mg/dL 21  Creatinine 0.61 - 1.24 mg/dL 1.08  Sodium 135 - 145 mmol/L 130(L)  Potassium 3.5 - 5.1 mmol/L 4.2  Chloride 98 - 111 mmol/L 97(L)  CO2 22 - 32 mmol/L 26  Calcium 8.9 - 10.3 mg/dL 8.7(L)  Total Protein 6.5 - 8.1 g/dL -  Total Bilirubin 0.3 - 1.2 mg/dL -  Alkaline Phos 38 - 126 U/L -  AST 15 - 41 U/L -  ALT 0 - 44 U/L -   CBC Latest Ref Rng & Units 09/29/2017  WBC 3.8 - 10.6 K/uL 1.9(L)  Hemoglobin 13.0 - 18.0 g/dL 12.6(L)  Hematocrit 40.0 - 52.0 % 37.0(L)  Platelets 150 - 440 K/uL 155    No  images are attached to the encounter.  Ct Chest W Contrast  Result Date: 09/04/2017 CLINICAL DATA:  New diagnosis renal cell carcinoma.  Staging exam EXAM: CT CHEST WITH CONTRAST TECHNIQUE: Multidetector CT imaging of the chest was performed during intravenous contrast administration. CONTRAST:  48mL ISOVUE-300 IOPAMIDOL (ISOVUE-300) INJECTION 61% COMPARISON:  CT 08/06/2017 FINDINGS: Cardiovascular: Seen Coronary artery calcification and aortic atherosclerotic calcification. Mediastinum/Nodes: No axillary supraclavicular adenopathy no mediastinal hilar adenopathy. No pericardial effusion. Esophagus normal. Lungs/Pleura: Rounded nodule in the RIGHT upper lobe measures 14 mm (image 73/4). Small LEFT upper lobe nodule measures 2 mm (60/4). Similar small 3 mm LEFT lower lobe nodule central calcification (image 81/4. Upper Abdomen: Limited view of the liver, kidneys, pancreas are unremarkable. Normal adrenal glands. Is dependent gallstones noted. High-density material within the RIGHT renal collecting system. Adrenal glands normal. Musculoskeletal: No aggressive osseous lesion. IMPRESSION: The 1. Smooth contour nodule in the RIGHT upper lobe concerning for urinary tract metastatic lesion. 2. Two small LEFT lung nodules are favored benign. Electronically Signed   By: Suzy Bouchard M.D.   On: 09/04/2017 16:56   Nm Pet Image Initial (pi) Skull Base To Thigh  Result Date: 09/21/2017 CLINICAL DATA:  Initial treatment strategy for urothelial carcinoma. EXAM: NUCLEAR MEDICINE PET SKULL BASE TO THIGH TECHNIQUE: 10.9 mCi F-18 FDG was injected intravenously. Full-ring PET imaging was performed from the skull base to thigh after the radiotracer. CT data was obtained and used for attenuation correction and anatomic localization. Fasting blood glucose: 92 mg/dl COMPARISON:  CT chest 09/04/2017 and CT abdomen pelvis 08/06/2017. FINDINGS: Mediastinal blood pool activity: SUV max 2.3 NECK: No hypermetabolic lymph nodes in the  neck. Incidental CT findings: None. CHEST: Mildly hypermetabolic left hilar lymph nodes measure up to 10 mm (CT image 101) with an SUV max of 4.1. No hypermetabolic mediastinal or axillary lymph nodes. A 1.3 x 1.5 cm nodule in the medial right upper lobe (series 3, image 102) shows metabolism around blood pool (SUV max 2.5). Incidental CT findings: Atherosclerotic calcification of the arterial vasculature, including coronary arteries. Heart is mildly enlarged. No pericardial or pleural effusion. ABDOMEN/PELVIS: No abnormal hypermetabolism in the liver, adrenal glands, spleen or pancreas. Right obturator lymph node measures 1.6 cm (CT image 147) with an SUV max of 6.8. No additional hypermetabolic lymph nodes. Incidental CT findings: Atherosclerotic calcification of the arterial vasculature without abdominal aortic aneurysm. Stones are seen in the gallbladder. Double-J right ureteral stent is in place SKELETON: No abnormal osseous hypermetabolism. Incidental CT findings: None. IMPRESSION: 1. Hypermetabolic right obturator lymph node is most indicative of metastatic disease. 2. Mildly hypermetabolic left hilar lymph nodes are nonspecific. Continued attention on follow-up exams is warranted. 3. Right upper lobe pulmonary nodule shows metabolism after just above blood pool and is therefore indeterminate. Continued attention on follow-up exams is warranted. 4. Aortic atherosclerosis (ICD10-170.0). Coronary artery calcification. 5. Cholelithiasis. Electronically Signed   By: Lorin Picket M.D.   On: 09/21/2017 14:55     Assessment and plan- Patient is a 84 y.o. male with h/o urothelial carcinoma likely stage [id number] with metastases to obturator LN. Left hilar LN and RUL lung nodule is indeterminate. He is here for on treament assessment prior to cycle 1 day 8 of carboplatin/ gemcitabine  His wbc is 1.9 today and anc is 0.9 today. I will theerfore hold his chemotherapy today. He will come back in1  Week with cbc  with diff and proceed with cycle 1 day 15 of carbo/ gem. Given his age he likely has a poor bone marrow reserve and I will therefore plan to give him carboplatin/ gemcitabine 1 week on and 1 week  off instead of 2 on 1 off.   I will see him back in 3 weeks with cbc with diff and cmp for cycle 2 day 1 of gem/carbo.  He will call us if he has any questions or concerns      Visit Diagnosis 1. 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[place])   2. Chemotherapy induced neutropenia (H[facility])      Dr. Randa Evens, MD, MPH [facility] [phone] 10/01/2017 8:32 AM

## 2017-10-06 ENCOUNTER — Inpatient Hospital Stay: Payer: Medicare HMO

## 2017-10-06 VITALS — BP 145/69 | HR 61 | Temp 97.6°F | Resp 20 | Wt 193.6 lb

## 2017-10-06 DIAGNOSIS — Z5111 Encounter for antineoplastic chemotherapy: Secondary | ICD-10-CM | POA: Diagnosis not present

## 2017-10-06 DIAGNOSIS — C669 Malignant neoplasm of unspecified ureter: Secondary | ICD-10-CM

## 2017-10-06 LAB — CBC WITH DIFFERENTIAL/PLATELET
BASOS PCT: 0 %
Basophils Absolute: 0 10*3/uL (ref 0–0.1)
EOS ABS: 0.1 10*3/uL (ref 0–0.7)
Eosinophils Relative: 1 %
HCT: 37 % — ABNORMAL LOW (ref 40.0–52.0)
Hemoglobin: 12.6 g/dL — ABNORMAL LOW (ref 13.0–18.0)
LYMPHS ABS: 1.3 10*3/uL (ref 1.0–3.6)
Lymphocytes Relative: 21 %
MCH: 30.5 pg (ref 26.0–34.0)
MCHC: 34.1 g/dL (ref 32.0–36.0)
MCV: 89.4 fL (ref 80.0–100.0)
MONOS PCT: 9 %
Monocytes Absolute: 0.5 10*3/uL (ref 0.2–1.0)
NEUTROS ABS: 4 10*3/uL (ref 1.4–6.5)
NEUTROS PCT: 69 %
PLATELETS: 343 10*3/uL (ref 150–440)
RBC: 4.14 MIL/uL — AB (ref 4.40–5.90)
RDW: 13.5 % (ref 11.5–14.5)
WBC: 5.9 10*3/uL (ref 3.8–10.6)

## 2017-10-06 LAB — BASIC METABOLIC PANEL
Anion gap: 8 (ref 5–15)
BUN: 18 mg/dL (ref 8–23)
CHLORIDE: 100 mmol/L (ref 98–111)
CO2: 25 mmol/L (ref 22–32)
CREATININE: 1.16 mg/dL (ref 0.61–1.24)
Calcium: 8.6 mg/dL — ABNORMAL LOW (ref 8.9–10.3)
GFR calc Af Amer: >60 mL/min (ref >60)
GFR calc non Af Amer: 56 mL/min — ABNORMAL LOW (ref >60)
Glucose, Bld: 174 mg/dL — ABNORMAL HIGH (ref 70–99)
Potassium: 3.9 mmol/L (ref 3.5–5.1)
Sodium: 133 mmol/L — ABNORMAL LOW (ref 135–145)

## 2017-10-06 MED ORDER — PALONOSETRON HCL INJECTION 0.25 MG/5ML
0.2500 mg | Freq: Once | INTRAVENOUS | Status: AC
  Administered 2017-10-06: 0.25 mg via INTRAVENOUS
  Filled 2017-10-06: qty 5

## 2017-10-06 MED ORDER — DEXAMETHASONE SODIUM PHOSPHATE 10 MG/ML IJ SOLN
10.0000 mg | Freq: Once | INTRAMUSCULAR | Status: AC
  Administered 2017-10-06: 10 mg via INTRAVENOUS
  Filled 2017-10-06: qty 1

## 2017-10-06 MED ORDER — SODIUM CHLORIDE 0.9 % IV SOLN
167.2000 mg | Freq: Once | INTRAVENOUS | Status: AC
  Administered 2017-10-06: 170 mg via INTRAVENOUS
  Filled 2017-10-06: qty 17

## 2017-10-06 MED ORDER — SODIUM CHLORIDE 0.9 % IV SOLN
1600.0000 mg | Freq: Once | INTRAVENOUS | Status: AC
  Administered 2017-10-06: 1600 mg via INTRAVENOUS
  Filled 2017-10-06: qty 26.3

## 2017-10-06 MED ORDER — SODIUM CHLORIDE 0.9% FLUSH
10.0000 mL | Freq: Once | INTRAVENOUS | Status: DC
  Filled 2017-10-06: qty 10

## 2017-10-06 MED ORDER — HEPARIN SOD (PORK) LOCK FLUSH 100 UNIT/ML IV SOLN
500.0000 [IU] | Freq: Once | INTRAVENOUS | Status: AC
  Administered 2017-10-06: 500 [IU] via INTRAVENOUS
  Filled 2017-10-06: qty 5

## 2017-10-06 MED ORDER — SODIUM CHLORIDE 0.9% FLUSH
10.0000 mL | INTRAVENOUS | Status: DC | PRN
  Filled 2017-10-06: qty 10

## 2017-10-06 MED ORDER — DEXAMETHASONE SODIUM PHOSPHATE 100 MG/10ML IJ SOLN: 10.0000 mg | Freq: Once | INTRAMUSCULAR | Status: DC

## 2017-10-06 MED ORDER — SODIUM CHLORIDE 0.9 % IV SOLN
Freq: Once | INTRAVENOUS | Status: AC
  Administered 2017-10-06: 14:00:00 via INTRAVENOUS
  Filled 2017-10-06: qty 1000

## 2017-10-06 MED ORDER — HEPARIN SOD (PORK) LOCK FLUSH 100 UNIT/ML IV SOLN: 500.0000 [IU] | Freq: Once | INTRAVENOUS | Status: DC | PRN

## 2017-10-13 ENCOUNTER — Ambulatory Visit (INDEPENDENT_AMBULATORY_CARE_PROVIDER_SITE_OTHER): Payer: Medicare HMO

## 2017-10-13 ENCOUNTER — Telehealth: Payer: Self-pay | Admitting: Urology

## 2017-10-13 DIAGNOSIS — C651 Malignant neoplasm of right renal pelvis: Secondary | ICD-10-CM | POA: Diagnosis not present

## 2017-10-13 LAB — URINALYSIS, COMPLETE
Bilirubin, UA: NEGATIVE
Glucose, UA: NEGATIVE
Ketones, UA: NEGATIVE
Leukocytes, UA: NEGATIVE
NITRITE UA: NEGATIVE
Specific Gravity, UA: 1.02 (ref 1.005–1.030)
Urobilinogen, Ur: 0.2 mg/dL (ref 0.2–1.0)
pH, UA: 7 (ref 5.0–7.5)

## 2017-10-13 LAB — MICROSCOPIC EXAMINATION
Bacteria, UA: NONE SEEN
WBC, UA: NONE SEEN /hpf (ref 0–5)

## 2017-10-13 NOTE — Telephone Encounter (Signed)
Pt has another appt and would like for you to return his call after 2 please.

## 2017-10-13 NOTE — Progress Notes (Signed)
Pt is present in office today for urinary retention. Pt states during the day he is urinating fine, but at night he cannot go. Pt does currently have a ureteral stent. Pt also states he is passing blood today. Upon speaking with pt, he admits that last night he was frustrated with lack of urine coming out, so he decided to cath himself with a catheter he had from two years ago. Pt states now it hurts to urinate. Per Dr Erlene Quan, pts urine was sent out for culture and pt most likely caused a trauma to his urethra or prostate when catheterizing himself. Pt states he will no longer self cath, will come in if he starts experiencing problems. PVR: 0.

## 2017-10-13 NOTE — Telephone Encounter (Signed)
Pt is currently taking chemotherapy.  He is having trouble making water.  His penis hurts very bad when he tries.  He tried to use a catheter last night and got about twice as much out as he normally would.  Please give pt a call [phone].

## 2017-10-16 LAB — CULTURE, URINE COMPREHENSIVE

## 2017-10-20 ENCOUNTER — Inpatient Hospital Stay: Payer: Medicare HMO

## 2017-10-20 ENCOUNTER — Encounter: Payer: Self-pay | Admitting: Oncology

## 2017-10-20 ENCOUNTER — Inpatient Hospital Stay: Payer: Medicare HMO | Attending: Oncology | Admitting: Oncology

## 2017-10-20 VITALS — BP 148/76 | HR 66 | Temp 97.8°F | Resp 18 | Ht 72.0 in | Wt 192.0 lb

## 2017-10-20 DIAGNOSIS — R918 Other nonspecific abnormal finding of lung field: Secondary | ICD-10-CM | POA: Diagnosis not present

## 2017-10-20 DIAGNOSIS — Z8551 Personal history of malignant neoplasm of bladder: Secondary | ICD-10-CM | POA: Diagnosis not present

## 2017-10-20 DIAGNOSIS — Z79899 Other long term (current) drug therapy: Secondary | ICD-10-CM | POA: Diagnosis not present

## 2017-10-20 DIAGNOSIS — C661 Malignant neoplasm of right ureter: Secondary | ICD-10-CM | POA: Diagnosis not present

## 2017-10-20 DIAGNOSIS — Z85828 Personal history of other malignant neoplasm of skin: Secondary | ICD-10-CM | POA: Diagnosis not present

## 2017-10-20 DIAGNOSIS — C669 Malignant neoplasm of unspecified ureter: Secondary | ICD-10-CM

## 2017-10-20 DIAGNOSIS — Z8042 Family history of malignant neoplasm of prostate: Secondary | ICD-10-CM | POA: Insufficient documentation

## 2017-10-20 DIAGNOSIS — I10 Essential (primary) hypertension: Secondary | ICD-10-CM | POA: Diagnosis not present

## 2017-10-20 DIAGNOSIS — Z5111 Encounter for antineoplastic chemotherapy: Secondary | ICD-10-CM

## 2017-10-20 LAB — CBC WITH DIFFERENTIAL/PLATELET
BASOS ABS: 0 10*3/uL (ref 0–0.1)
Basophils Relative: 1 %
Eosinophils Absolute: 0 10*3/uL (ref 0–0.7)
Eosinophils Relative: 1 %
HEMATOCRIT: 35.4 % — AB (ref 40.0–52.0)
HEMOGLOBIN: 12.5 g/dL — AB (ref 13.0–18.0)
LYMPHS PCT: 23 %
Lymphs Abs: 1 10*3/uL (ref 1.0–3.6)
MCH: 31.4 pg (ref 26.0–34.0)
MCHC: 35.4 g/dL (ref 32.0–36.0)
MCV: 88.9 fL (ref 80.0–100.0)
MONOS PCT: 11 %
Monocytes Absolute: 0.5 10*3/uL (ref 0.2–1.0)
NEUTROS ABS: 2.9 10*3/uL (ref 1.4–6.5)
NEUTROS PCT: 64 %
Platelets: 193 10*3/uL (ref 150–440)
RBC: 3.98 MIL/uL — ABNORMAL LOW (ref 4.40–5.90)
RDW: 13.9 % (ref 11.5–14.5)
WBC: 4.5 10*3/uL (ref 3.8–10.6)

## 2017-10-20 LAB — BASIC METABOLIC PANEL
Anion gap: 8 (ref 5–15)
BUN: 23 mg/dL (ref 8–23)
CO2: 26 mmol/L (ref 22–32)
Calcium: 8.9 mg/dL (ref 8.9–10.3)
Chloride: 101 mmol/L (ref 98–111)
Creatinine, Ser: 1.12 mg/dL (ref 0.61–1.24)
GFR calc Af Amer: >60 mL/min (ref >60)
GFR, EST NON AFRICAN AMERICAN: 58 mL/min — AB (ref >60)
GLUCOSE: 134 mg/dL — AB (ref 70–99)
POTASSIUM: 3.9 mmol/L (ref 3.5–5.1)
Sodium: 135 mmol/L (ref 135–145)

## 2017-10-20 MED ORDER — HEPARIN SOD (PORK) LOCK FLUSH 100 UNIT/ML IV SOLN
500.0000 [IU] | Freq: Once | INTRAVENOUS | Status: AC | PRN
  Administered 2017-10-20: 500 [IU]
  Filled 2017-10-20: qty 5

## 2017-10-20 MED ORDER — SODIUM CHLORIDE 0.9% FLUSH
10.0000 mL | INTRAVENOUS | Status: DC | PRN
  Administered 2017-10-20: 10 mL
  Filled 2017-10-20: qty 10

## 2017-10-20 MED ORDER — SODIUM CHLORIDE 0.9 % IV SOLN
170.0000 mg | Freq: Once | INTRAVENOUS | Status: AC
  Administered 2017-10-20: 170 mg via INTRAVENOUS
  Filled 2017-10-20: qty 17

## 2017-10-20 MED ORDER — SODIUM CHLORIDE 0.9 % IV SOLN: 10.0000 mg | Freq: Once | INTRAVENOUS | Status: DC

## 2017-10-20 MED ORDER — PALONOSETRON HCL INJECTION 0.25 MG/5ML
0.2500 mg | Freq: Once | INTRAVENOUS | Status: AC
  Administered 2017-10-20: 0.25 mg via INTRAVENOUS
  Filled 2017-10-20: qty 5

## 2017-10-20 MED ORDER — SODIUM CHLORIDE 0.9 % IV SOLN
1600.0000 mg | Freq: Once | INTRAVENOUS | Status: AC
  Administered 2017-10-20: 1600 mg via INTRAVENOUS
  Filled 2017-10-20: qty 26.3

## 2017-10-20 MED ORDER — DEXAMETHASONE SODIUM PHOSPHATE 10 MG/ML IJ SOLN
10.0000 mg | Freq: Once | INTRAMUSCULAR | Status: AC
  Administered 2017-10-20: 10 mg via INTRAVENOUS
  Filled 2017-10-20: qty 1

## 2017-10-20 MED ORDER — SODIUM CHLORIDE 0.9 % IV SOLN
Freq: Once | INTRAVENOUS | Status: AC
  Administered 2017-10-20: 10:00:00 via INTRAVENOUS
  Filled 2017-10-20: qty 1000

## 2017-10-20 NOTE — Progress Notes (Signed)
Hematology/Oncology Consult note [facility]  Telephone:([phone] [phone]  Patient Care Team: Idelle Crouch, MD as PCP - General (Internal Medicine)   Name of the patient: Anthony Gonzales  [id number]  Sep 30, 1931   Date of visit: 10/20/17  Diagnosis- Metastatic urothelial carcinoma with possible mets to the obturator node. Indeterminate hilar lymph nodes and LUL lung nodule  Chief complaint/ Reason for visit-on treatment assessment prior to cycle 2-day 1 of carboplatin Gemzar  Heme/Onc history: patient is a 84 year old male with past medical history significant for hypertension and long-standing history of superficial bladder cancer for which he sees [name]. He has undergone TURBT as well as ureteroscopy since 2017 along with mitomycin as well in the past. Most recently he underwent CT abdomen on 08/06/2017 which showed a soft tissue mass in the right renal pelvis concerning for upper tract urothelial neoplasm. Soft tissue fullness at the right ureterovesical junction with proximal right hydroureteronephrosis. Several millimeter attenuation lesion in the pancreatic head.  He underwent diagnostic ureteroscopy and was found to have 2 high-grade lesions within his right kidney. There was a nodular high-grade appearing lesion in the right anterior renal pelvis. There was also a second ureteral tumor fungating from the right ureteral orifice extending into the distal ureter also consistent with high-grade invasive urothelial carcinoma. Muscle invasion could not be assessed. He also underwent a CT chest which showed a rounded nodule in the right upper lobe measuring 14 mm concerning for metastases. 2 other 3 mm lesions were also noted in the left upper lobe likely benign  Plan initially was neoadjuvant chemotherapy followed by possible surgery but given the presence of lung lesion patient has been referred to oncology for the  same.  PET/CT on 09/21/17 showed:IMPRESSION: 1. Hypermetabolic right obturator lymph node is most indicative of metastatic disease. 2. Mildly hypermetabolic left hilar lymph nodes are nonspecific. Continued attention on follow-up exams is warranted. 3. Right upper lobe pulmonary nodule shows metabolism after just above blood pool and is therefore indeterminate. Continued attention on follow-up exams is warranted. 4. Aortic atherosclerosis (ICD10-170.0). Coronary artery calcification. 5. Cholelithiasis   Plan is to proceed with 4-6 cycles of carboplatin/ gemzar 1 week on and one-week off as patient could not tolerate 2-week on and one week off regimen.  Cycle 1 started on 09/22/2017  Interval history-few days ago patient had an episode of possible urinary retention in the morning for which he self catheterized.  He then got in touch with urology and was noted to have some blood in his urine when he was asked to give a urine sample at the office visit.  This sporadic episode of hematuria was attributed to his self-catheterization and he was advised not to go for that again in the future.  Otherwise he is tolerating chemotherapy well.  Denies any nausea vomiting or fatigue.  He has been walking 1 to 2 miles every day.  He is independent of his ADLs and IADLs.  ECOG PS- 1 Pain scale- 0 Opioid associated constipation- no  Review of systems- Review of Systems  Constitutional: Negative for chills, fever, malaise/fatigue and weight loss.  HENT: Negative for congestion, ear discharge and nosebleeds.   Eyes: Negative for blurred vision.  Respiratory: Negative for cough, hemoptysis, sputum production, shortness of breath and wheezing.   Cardiovascular: Negative for chest pain, palpitations, orthopnea and claudication.  Gastrointestinal: Negative for abdominal pain, blood in stool, constipation, diarrhea, heartburn, melena, nausea and vomiting.  Genitourinary: Negative for dysuria,  flank pain,  frequency, hematuria and urgency.  Musculoskeletal: Negative for back pain, joint pain and myalgias.  Skin: Negative for rash.  Neurological: Negative for dizziness, tingling, focal weakness, seizures, weakness and headaches.  Endo/Heme/Allergies: Does not bruise/bleed easily.  Psychiatric/Behavioral: Negative for depression and suicidal ideas. The patient does not have insomnia.       Allergies  Allergen Reactions  . Demerol [Meperidine] Nausea And Vomiting  . Lipitor [Atorvastatin] Swelling  . Sulfa Antibiotics Nausea And Vomiting and Rash     Past Medical History:  Diagnosis Date  . Arthritis   . Benign fibroma of prostate 08/23/2013  . BPH (benign prostatic hyperplasia)   . Calculus of kidney 08/23/2013  . Cancer [facility])    bladder cancer and skin cancer  . Glaucoma    no drops in 3 mo pressure good, pt denies glaucoma, eye pressure has been measuring alright.  . History of kidney stones   . HLD (hyperlipidemia)   . HOH (hard of hearing)    Left Hearing Aid  . HTN (hypertension) 12/26/2014  . Hypertension   . Hyponatremia 12/26/2014  . Migraines    history of migraines when he was younger.  [name] Restless leg syndrome   . Sinus drainage   . UTI (lower urinary tract infection) 12/26/2014  . Vertigo      Past Surgical History:  Procedure Laterality Date  . COLONOSCOPY    . CYSTOSCOPY W/ RETROGRADES Right 01/30/2015   Procedure: CYSTOSCOPY WITH RETROGRADE PYELOGRAM;  Surgeon: [name], MD;  Location: [facility];  Service: Urology;  Laterality: Right;  . CYSTOSCOPY W/ RETROGRADES Bilateral 02/26/2016   Procedure: CYSTOSCOPY WITH RETROGRADE PYELOGRAM;  Surgeon: [name], MD;  Location: [facility];  Service: Urology;  Laterality: Bilateral;  . CYSTOSCOPY W/ URETERAL STENT PLACEMENT Right 08/20/2015   Procedure: CYSTOSCOPY WITH RETROGRADE PYELOGRAM/POSSIBLE URETERAL STENT PLACEMENT/BLADDER BIOPSY;  Surgeon: [name], MD;  Location: [facility];  Service: Urology;   Laterality: Right;  . CYSTOSCOPY W/ URETERAL STENT PLACEMENT Right 09/12/2015   Procedure: CYSTOSCOPY WITH STENT REPLACEMENT;  Surgeon: [name], MD;  Location: [facility];  Service: Urology;  Laterality: Right;  . CYSTOSCOPY WITH BIOPSY Right 09/12/2015   Procedure: CYSTOSCOPY WITH BLADDER AND URETERAL BIOPSY;  Surgeon: [name], MD;  Location: [facility];  Service: Urology;  Laterality: Right;  . CYSTOSCOPY WITH STENT PLACEMENT Right 01/30/2015   Procedure: CYSTOSCOPY WITH STENT PLACEMENT;  Surgeon: [name], MD;  Location: [facility];  Service: Urology;  Laterality: Right;  . CYSTOSCOPY/URETEROSCOPY/HOLMIUM LASER/STENT PLACEMENT Right 08/19/2017   Procedure: CYSTOSCOPY/URETEROSCOPY/HOLMIUM LASER/STENT PLACEMENT;  Surgeon: [name], MD;  Location: [facility];  Service: Urology;  Laterality: Right;  . EYE SURGERY Bilateral    Cataract Extraction with IOL  . goiter removal    . HOLMIUM LASER APPLICATION N/A 7/56/4332   Procedure:  HOLMIUM LASER APPLICATION;  Surgeon: [name], MD;  Location: [facility];  Service: Urology;  Laterality: N/A;  . PORTA CATH INSERTION N/A 09/23/2017   Procedure: PORTA CATH INSERTION;  Surgeon: Algernon Huxley, MD;  Location: [place] [facility];  Service: Cardiovascular;  Laterality: N/A;  . SPERMATOCELECTOMY    . TONSILLECTOMY    . TRANSURETHRAL RESECTION OF BLADDER TUMOR N/A 02/26/2016   Procedure: TRANSURETHRAL RESECTION OF BLADDER TUMOR (TURBT);  Surgeon: [name], MD;  Location: [facility];  Service: Urology;  Laterality: N/A;  . TRANSURETHRAL RESECTION OF BLADDER TUMOR WITH MITOMYCIN-C N/A 09/12/2015   Procedure: TRANSURETHRAL RESECTION OF BLADDER TUMOR ;  Surgeon: Caryl Pina  Erlene Quan, MD;  Location: [facility];  Service: Urology;  Laterality: N/A;  . TRANSURETHRAL RESECTION OF BLADDER TUMOR WITH MITOMYCIN-C N/A 03/24/2016   Procedure: TRANSURETHRAL RESECTION OF BLADDER TUMOR WITH MITOMYCIN-C  (SMALL);  Surgeon: [name], MD;  Location: [facility];   Service: Urology;  Laterality: N/A;  . URETERAL BIOPSY Right 08/19/2017   Procedure: Renal Mass BIOPSY;  Surgeon: [name], MD;  Location: [facility];  Service: Urology;  Laterality: Right;  . URETEROSCOPY Right 01/30/2015   Procedure: URETEROSCOPY/ WITH BIOPSY AND CYTOLOGY BRUSHING;  Surgeon: [name], MD;  Location: [facility];  Service: Urology;  Laterality: Right;  . URETEROSCOPY Right 08/20/2015   Procedure: URETEROSCOPY;  Surgeon: [name], MD;  Location: [facility];  Service: Urology;  Laterality: Right;  . URETEROSCOPY Right 09/12/2015   Procedure: URETEROSCOPY;  Surgeon: [name], MD;  Location: [facility];  Service: Urology;  Laterality: Right;  . URETEROSCOPY Right 02/26/2016   Procedure: URETEROSCOPY;  Surgeon: [name], MD;  Location: [facility];  Service: Urology;  Laterality: Right;    Social History   Socioeconomic History  . Marital status: Married    Spouse name: Not on file  . Number of children: Not on file  . Years of education: Not on file  . Highest education level: Not on file  Occupational History  . Not on file  Social Needs  . Financial resource strain: Not on file  . Food insecurity:    Worry: Not on file    Inability: Not on file  . Transportation needs:    Medical: Not on file    Non-medical: Not on file  Tobacco Use  . Smoking status: Never Smoker  . Smokeless tobacco: Never Used  Substance and Sexual Activity  . Alcohol use: No    Alcohol/week: 0.0 standard drinks  . Drug use: No  . Sexual activity: Not on file  Lifestyle  . Physical activity:    Days per week: Not on file    Minutes per session: Not on file  . Stress: Not on file  Relationships  . Social connections:    Talks on phone: Not on file    Gets together: Not on file    Attends religious service: Not on file    Active member of club or organization: Not on file    Attends meetings of clubs or organizations: Not on file    Relationship status: Not on file   . Intimate partner violence:    Fear of current or ex partner: Not on file    Emotionally abused: Not on file    Physically abused: Not on file    Forced sexual activity: Not on file  Other Topics Concern  . Not on file  Social History Narrative  . Not on file    Family History  Problem Relation Age of Onset  . Hypertension Mother   . Hypertension Father   . Prostate cancer Brother   . Kidney disease Neg Hx   . Kidney cancer Neg Hx   . Bladder Cancer Neg Hx      Current Outpatient Medications:  .  acetaminophen (TYLENOL) 500 MG tablet, Take 1,000 mg by mouth daily as needed for moderate pain. , Disp: , Rfl:  .  benazepril-hydrochlorthiazide (LOTENSIN HCT) 20-12.5 MG tablet, Take 1 tablet by mouth daily. , Disp: , Rfl:  .  dexamethasone (DECADRON) 4 MG tablet, Take 2 tablets (8 mg total) by mouth daily. Start the day after carboplatin chemotherapy for  2 days. (Patient not taking: Reported on 09/29/2017), Disp: 30 tablet, Rfl: 1 .  diazepam (VALIUM) 2 MG tablet, Take 2 mg by mouth every 12 (twelve) hours as needed for anxiety., Disp: , Rfl:  .  docusate sodium (COLACE) 100 MG capsule, Take 100 mg by mouth daily as needed for mild constipation., Disp: , Rfl:  .  fenofibrate micronized (LOFIBRA) 134 MG capsule, Take 134 mg by mouth daily before breakfast. , Disp: , Rfl:  .  fluticasone (FLONASE) 50 MCG/ACT nasal spray, Place 2 sprays into both nostrils at bedtime., Disp: , Rfl:  .  HYDROcodone-acetaminophen (NORCO/VICODIN) 5-325 MG tablet, Take 1-2 tablets by mouth every 6 (six) hours as needed for moderate pain. (Patient not taking: Reported on 09/29/2017), Disp: 10 tablet, Rfl: 0 .  hydrocortisone cream 1 %, Apply 1 application topically daily as needed for itching., Disp: , Rfl:  .  lidocaine-prilocaine (EMLA) cream, Apply to affected area once (Patient not taking: Reported on 09/29/2017), Disp: 30 g, Rfl: 3 .  LORazepam (ATIVAN) 0.5 MG tablet, Take 1 tablet (0.5 mg total) by mouth  every 6 (six) hours as needed (Nausea or vomiting). (Patient not taking: Reported on 09/29/2017), Disp: 30 tablet, Rfl: 0 .  Multiple Vitamin (MULTIVITAMIN WITH MINERALS) TABS tablet, Take 1 tablet by mouth daily., Disp: , Rfl:  .  Multiple Vitamins-Minerals (ICAPS AREDS 2) CAPS, Take 1 capsule by mouth 2 (two) times daily., Disp: , Rfl:  .  ondansetron (ZOFRAN) 8 MG tablet, Take 1 tablet (8 mg total) by mouth 2 (two) times daily as needed for refractory nausea / vomiting. Start on day 3 after carboplatin chemo. (Patient not taking: Reported on 09/29/2017), Disp: 30 tablet, Rfl: 1 .  Potassium 99 MG TABS, Take 99 mg by mouth at bedtime., Disp: , Rfl:  .  prochlorperazine (COMPAZINE) 10 MG tablet, Take 1 tablet (10 mg total) by mouth every 6 (six) hours as needed (Nausea or vomiting). (Patient not taking: Reported on 09/29/2017), Disp: 30 tablet, Rfl: 1 .  psyllium (METAMUCIL) 58.6 % packet, Take 1 packet by mouth daily as needed (constipation). , Disp: , Rfl:  .  tamsulosin (FLOMAX) 0.4 MG CAPS capsule, Take 1 capsule (0.4 mg total) by mouth daily. (Patient taking differently: Take 0.4 mg by mouth every evening. ), Disp: 90 capsule, Rfl: 3  Physical exam:  Vitals:   10/20/17 0901  BP: (!) 148/76  Pulse: 66  Resp: 18  Temp: 97.8 F (36.6 C)  TempSrc: Tympanic  Weight: 192 lb (87.1 kg)  Height: 6' (1.829 m)   Physical Exam  Constitutional: He is oriented to person, place, and time. He appears well-developed and well-nourished.  HENT:  Head: Normocephalic and atraumatic.  Eyes: Pupils are equal, round, and reactive to light. EOM are normal.  Neck: Normal range of motion.  Cardiovascular: Normal rate, regular rhythm and normal heart sounds.  Pulmonary/Chest: Effort normal and breath sounds normal.  Abdominal: Soft. Bowel sounds are normal.  Neurological: He is alert and oriented to person, place, and time.  Skin: Skin is warm and dry.     CMP Latest Ref Rng & Units 10/20/2017  Glucose 70 -  99 mg/dL 134(H)  BUN 8 - 23 mg/dL 23  Creatinine 0.61 - 1.24 mg/dL 1.12  Sodium 135 - 145 mmol/L 135  Potassium 3.5 - 5.1 mmol/L 3.9  Chloride 98 - 111 mmol/L 101  CO2 22 - 32 mmol/L 26  Calcium 8.9 - 10.3 mg/dL 8.9  Total Protein 6.5 -  8.1 g/dL -  Total Bilirubin 0.3 - 1.2 mg/dL -  Alkaline Phos 38 - 126 U/L -  AST 15 - 41 U/L -  ALT 0 - 44 U/L -   CBC Latest Ref Rng & Units 10/20/2017  WBC 3.8 - 10.6 K/uL 4.5  Hemoglobin 13.0 - 18.0 g/dL 12.5(L)  Hematocrit 40.0 - 52.0 % 35.4(L)  Platelets 150 - 440 K/uL 193    No images are attached to the encounter.  Nm Pet Image Initial (pi) Skull Base To Thigh  Result Date: 09/21/2017 CLINICAL DATA:  Initial treatment strategy for urothelial carcinoma. EXAM: NUCLEAR MEDICINE PET SKULL BASE TO THIGH TECHNIQUE: 10.9 mCi F-18 FDG was injected intravenously. Full-ring PET imaging was performed from the skull base to thigh after the radiotracer. CT data was obtained and used for attenuation correction and anatomic localization. Fasting blood glucose: 92 mg/dl COMPARISON:  CT chest 09/04/2017 and CT abdomen pelvis 08/06/2017. FINDINGS: Mediastinal blood pool activity: SUV max 2.3 NECK: No hypermetabolic lymph nodes in the neck. Incidental CT findings: None. CHEST: Mildly hypermetabolic left hilar lymph nodes measure up to 10 mm (CT image 101) with an SUV max of 4.1. No hypermetabolic mediastinal or axillary lymph nodes. A 1.3 x 1.5 cm nodule in the medial right upper lobe (series 3, image 102) shows metabolism around blood pool (SUV max 2.5). Incidental CT findings: Atherosclerotic calcification of the arterial vasculature, including coronary arteries. Heart is mildly enlarged. No pericardial or pleural effusion. ABDOMEN/PELVIS: No abnormal hypermetabolism in the liver, adrenal glands, spleen or pancreas. Right obturator lymph node measures 1.6 cm (CT image 147) with an SUV max of 6.8. No additional hypermetabolic lymph nodes. Incidental CT findings:  Atherosclerotic calcification of the arterial vasculature without abdominal aortic aneurysm. Stones are seen in the gallbladder. Double-J right ureteral stent is in place SKELETON: No abnormal osseous hypermetabolism. Incidental CT findings: None. IMPRESSION: 1. Hypermetabolic right obturator lymph node is most indicative of metastatic disease. 2. Mildly hypermetabolic left hilar lymph nodes are nonspecific. Continued attention on follow-up exams is warranted. 3. Right upper lobe pulmonary nodule shows metabolism after just above blood pool and is therefore indeterminate. Continued attention on follow-up exams is warranted. 4. Aortic atherosclerosis (ICD10-170.0). Coronary artery calcification. 5. Cholelithiasis. Electronically Signed   By: Lorin Picket M.D.   On: 09/21/2017 14:55     Assessment and plan- Patient is a 84 y.o. male urothelial carcinoma likely stage [id number] with metastases to obturator LN. Left hilar and RUL lung nodule is indeterminate  Counts okay to proceed with cycle 2-day 1 of carboplatin AUC 2 along with Gemzar at 800 mg/m today.  He will get next week off and I will see him back in 2 weeks time for cycle 2-day 15 of chemotherapy with CBC and CMP.  I will plan to get interim scans after 4 cycles of treatment   Visit Diagnosis 1. 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[place])   2. Encounter for antineoplastic chemotherapy      Dr. Randa Evens, MD, MPH [facility] at [facility] [phone] 10/20/2017 10:22 AM

## 2017-10-20 NOTE — Progress Notes (Signed)
No new changes noted today 

## 2017-11-02 NOTE — Progress Notes (Signed)
Hematology/Oncology Consult note [facility]  Telephone:([phone] [phone]  Patient Care Team: Idelle Crouch, MD as PCP - General (Internal Medicine)   Name of the patient: Anthony Gonzales  [id number]  06/16/1931   Date of visit: 11/02/17  Diagnosis- Metastatic urothelial carcinoma with possible mets to the obturator node. Indeterminate hilar lymph nodes and LUL lung nodule  Chief complaint/ Reason for visit-on treatment assessment prior to cycle 2-day 15 of carboplatin and Gemzar  Heme/Onc history: patient is a 84 year old male with past medical history significant for hypertension and long-standing history of superficial bladder cancer for which he sees [name]. He has undergone TURBT as well as ureteroscopy since 2017 along with mitomycin as well in the past. Most recently he underwent CT abdomen on 08/06/2017 which showed a soft tissue mass in the right renal pelvis concerning for upper tract urothelial neoplasm. Soft tissue fullness at the right ureterovesical junction with proximal right hydroureteronephrosis. Several millimeter attenuation lesion in the pancreatic head.  He underwent diagnostic ureteroscopy and was found to have 2 high-grade lesions within his right kidney. There was a nodular high-grade appearing lesion in the right anterior renal pelvis. There was also a second ureteral tumor fungating from the right ureteral orifice extending into the distal ureter also consistent with high-grade invasive urothelial carcinoma. Muscle invasion could not be assessed. He also underwent a CT chest which showed a rounded nodule in the right upper lobe measuring 14 mm concerning for metastases. 2 other 3 mm lesions were also noted in the left upper lobe likely benign  Plan initially was neoadjuvant chemotherapy followed by possible surgery but given the presence of lung lesion patient has been referred to oncology for the  same.  PET/CT on 09/21/17 showed:IMPRESSION: 1. Hypermetabolic right obturator lymph node is most indicative of metastatic disease. 2. Mildly hypermetabolic left hilar lymph nodes are nonspecific. Continued attention on follow-up exams is warranted. 3. Right upper lobe pulmonary nodule shows metabolism after just above blood pool and is therefore indeterminate. Continued attention on follow-up exams is warranted. 4. Aortic atherosclerosis (ICD10-170.0). Coronary artery calcification. 5. Cholelithiasis   Plan is to proceed with 4-6 cycles of carboplatin/ gemzar 1 week on and one-week off as patient could not tolerate 2-week on and one week off regimen.  Cycle 1 started on 09/22/2017  Interval history-  He is tolerating chemo well.  Reports mild fatigue but overall he has been able to carry on his ADLs and IADLs without any significant side effects.  He walks 1 to 1-1/2 mile daily.  He has been able to wash his car without any issues.  ECOG PS- 1 Pain scale- 0 Opioid associated constipation- no  Review of systems- Review of Systems  Constitutional: Positive for malaise/fatigue. Negative for chills, fever and weight loss.  HENT: Negative for congestion, ear discharge and nosebleeds.   Eyes: Negative for blurred vision.  Respiratory: Negative for cough, hemoptysis, sputum production, shortness of breath and wheezing.   Cardiovascular: Negative for chest pain, palpitations, orthopnea and claudication.  Gastrointestinal: Negative for abdominal pain, blood in stool, constipation, diarrhea, heartburn, melena, nausea and vomiting.  Genitourinary: Negative for dysuria, flank pain, frequency, hematuria and urgency.  Musculoskeletal: Negative for back pain, joint pain and myalgias.  Skin: Negative for rash.  Neurological: Negative for dizziness, tingling, focal weakness, seizures, weakness and headaches.  Endo/Heme/Allergies: Does not bruise/bleed easily.  Psychiatric/Behavioral: Negative  for depression and suicidal ideas. The patient does not have insomnia.  Allergies  Allergen Reactions  . Demerol [Meperidine] Nausea And Vomiting  . Lipitor [Atorvastatin] Swelling  . Sulfa Antibiotics Nausea And Vomiting and Rash     Past Medical History:  Diagnosis Date  . Arthritis   . Benign fibroma of prostate 08/23/2013  . BPH (benign prostatic hyperplasia)   . Calculus of kidney 08/23/2013  . [facility])    bladder cancer and skin cancer  . Glaucoma    no drops in 3 mo pressure good, pt denies glaucoma, eye pressure has been measuring alright.  . History of kidney stones   . HLD (hyperlipidemia)   . HOH (hard of hearing)    Left Hearing Aid  . HTN (hypertension) 12/26/2014  . Hypertension   . Hyponatremia 12/26/2014  . Migraines    history of migraines when he was younger.  [name] Restless leg syndrome   . Sinus drainage   . UTI (lower urinary tract infection) 12/26/2014  . Vertigo      Past Surgical History:  Procedure Laterality Date  . COLONOSCOPY    . CYSTOSCOPY W/ RETROGRADES Right 01/30/2015   Procedure: CYSTOSCOPY WITH RETROGRADE PYELOGRAM;  Surgeon: [name], MD;  Location: [facility];  Service: Urology;  Laterality: Right;  . CYSTOSCOPY W/ RETROGRADES Bilateral 02/26/2016   Procedure: CYSTOSCOPY WITH RETROGRADE PYELOGRAM;  Surgeon: [name], MD;  Location: [facility];  Service: Urology;  Laterality: Bilateral;  . CYSTOSCOPY W/ URETERAL STENT PLACEMENT Right 08/20/2015   Procedure: CYSTOSCOPY WITH RETROGRADE PYELOGRAM/POSSIBLE URETERAL STENT PLACEMENT/BLADDER BIOPSY;  Surgeon: [name], MD;  Location: [facility];  Service: Urology;  Laterality: Right;  . CYSTOSCOPY W/ URETERAL STENT PLACEMENT Right 09/12/2015   Procedure: CYSTOSCOPY WITH STENT REPLACEMENT;  Surgeon: [name], MD;  Location: [facility];  Service: Urology;  Laterality: Right;  . CYSTOSCOPY WITH BIOPSY Right 09/12/2015   Procedure: CYSTOSCOPY WITH BLADDER AND URETERAL BIOPSY;   Surgeon: [name], MD;  Location: [facility];  Service: Urology;  Laterality: Right;  . CYSTOSCOPY WITH STENT PLACEMENT Right 01/30/2015   Procedure: CYSTOSCOPY WITH STENT PLACEMENT;  Surgeon: [name], MD;  Location: [facility];  Service: Urology;  Laterality: Right;  . CYSTOSCOPY/URETEROSCOPY/HOLMIUM LASER/STENT PLACEMENT Right 08/19/2017   Procedure: CYSTOSCOPY/URETEROSCOPY/HOLMIUM LASER/STENT PLACEMENT;  Surgeon: [name], MD;  Location: [facility];  Service: Urology;  Laterality: Right;  . EYE SURGERY Bilateral    Cataract Extraction with IOL  . goiter removal    . HOLMIUM LASER APPLICATION N/A 06/03/7122   Procedure:  HOLMIUM LASER APPLICATION;  Surgeon: [name], MD;  Location: [facility];  Service: Urology;  Laterality: N/A;  . PORTA CATH INSERTION N/A 09/23/2017   Procedure: PORTA CATH INSERTION;  Surgeon: Algernon Huxley, MD;  Location: [facility];  Service: Cardiovascular;  Laterality: N/A;  . SPERMATOCELECTOMY    . TONSILLECTOMY    . TRANSURETHRAL RESECTION OF BLADDER TUMOR N/A 02/26/2016   Procedure: TRANSURETHRAL RESECTION OF BLADDER TUMOR (TURBT);  Surgeon: [name], MD;  Location: [facility];  Service: Urology;  Laterality: N/A;  . TRANSURETHRAL RESECTION OF BLADDER TUMOR WITH MITOMYCIN-C N/A 09/12/2015   Procedure: TRANSURETHRAL RESECTION OF BLADDER TUMOR ;  Surgeon: [name], MD;  Location: [facility];  Service: Urology;  Laterality: N/A;  . TRANSURETHRAL RESECTION OF BLADDER TUMOR WITH MITOMYCIN-C N/A 03/24/2016   Procedure: TRANSURETHRAL RESECTION OF BLADDER TUMOR WITH MITOMYCIN-C  (SMALL);  Surgeon: [name], MD;  Location: [facility];  Service: Urology;  Laterality: N/A;  . URETERAL BIOPSY Right 08/19/2017   Procedure: Renal  Mass BIOPSY;  Surgeon: [name], MD;  Location: [facility];  Service: Urology;  Laterality: Right;  . URETEROSCOPY Right 01/30/2015   Procedure: URETEROSCOPY/ WITH BIOPSY AND CYTOLOGY BRUSHING;  Surgeon: [name], MD;  Location: [facility];  Service: Urology;  Laterality: Right;  . URETEROSCOPY Right 08/20/2015   Procedure: URETEROSCOPY;  Surgeon: [name], MD;  Location: [facility];  Service: Urology;  Laterality: Right;  . URETEROSCOPY Right 09/12/2015   Procedure: URETEROSCOPY;  Surgeon: [name], MD;  Location: [facility];  Service: Urology;  Laterality: Right;  . URETEROSCOPY Right 02/26/2016   Procedure: URETEROSCOPY;  Surgeon: [name], MD;  Location: [facility];  Service: Urology;  Laterality: Right;    Social History   Socioeconomic History  . Marital status: Married    Spouse name: Not on file  . Number of children: Not on file  . Years of education: Not on file  . Highest education level: Not on file  Occupational History  . Not on file  Social Needs  . Financial resource strain: Not on file  . Food insecurity:    Worry: Not on file    Inability: Not on file  . Transportation needs:    Medical: Not on file    Non-medical: Not on file  Tobacco Use  . Smoking status: Never Smoker  . Smokeless tobacco: Never Used  Substance and Sexual Activity  . Alcohol use: No    Alcohol/week: 0.0 standard drinks  . Drug use: No  . Sexual activity: Not on file  Lifestyle  . Physical activity:    Days per week: Not on file    Minutes per session: Not on file  . Stress: Not on file  Relationships  . Social connections:    Talks on phone: Not on file    Gets together: Not on file    Attends religious service: Not on file    Active member of club or organization: Not on file    Attends meetings of clubs or organizations: Not on file    Relationship status: Not on file  . Intimate partner violence:    Fear of current or ex partner: Not on file    Emotionally abused: Not on file    Physically abused: Not on file    Forced sexual activity: Not on file  Other Topics Concern  . Not on file  Social History Narrative  . Not on file    Family History  Problem Relation  Age of Onset  . Hypertension Mother   . Hypertension Father   . Prostate cancer Brother   . Kidney disease Neg Hx   . Kidney cancer Neg Hx   . Bladder Cancer Neg Hx      Current Outpatient Medications:  .  acetaminophen (TYLENOL) 500 MG tablet, Take 1,000 mg by mouth daily as needed for moderate pain. , Disp: , Rfl:  .  benazepril-hydrochlorthiazide (LOTENSIN HCT) 20-12.5 MG tablet, Take 1 tablet by mouth daily. , Disp: , Rfl:  .  dexamethasone (DECADRON) 4 MG tablet, Take 2 tablets (8 mg total) by mouth daily. Start the day after carboplatin chemotherapy for 2 days. (Patient not taking: Reported on 09/29/2017), Disp: 30 tablet, Rfl: 1 .  diazepam (VALIUM) 2 MG tablet, Take 2 mg by mouth every 12 (twelve) hours as needed for anxiety., Disp: , Rfl:  .  docusate sodium (COLACE) 100 MG capsule, Take 100 mg by mouth daily as needed for mild constipation., Disp: , Rfl:  .  fenofibrate micronized (LOFIBRA) 134 MG capsule, Take 134 mg by mouth daily before breakfast. , Disp: , Rfl:  .  fluticasone (FLONASE) 50 MCG/ACT nasal spray, Place 2 sprays into both nostrils at bedtime., Disp: , Rfl:  .  HYDROcodone-acetaminophen (NORCO/VICODIN) 5-325 MG tablet, Take 1-2 tablets by mouth every 6 (six) hours as needed for moderate pain. (Patient not taking: Reported on 09/29/2017), Disp: 10 tablet, Rfl: 0 .  hydrocortisone cream 1 %, Apply 1 application topically daily as needed for itching., Disp: , Rfl:  .  lidocaine-prilocaine (EMLA) cream, Apply to affected area once (Patient not taking: Reported on 09/29/2017), Disp: 30 g, Rfl: 3 .  LORazepam (ATIVAN) 0.5 MG tablet, Take 1 tablet (0.5 mg total) by mouth every 6 (six) hours as needed (Nausea or vomiting)., Disp: 30 tablet, Rfl: 0 .  Multiple Vitamin (MULTIVITAMIN WITH MINERALS) TABS tablet, Take 1 tablet by mouth daily., Disp: , Rfl:  .  Multiple Vitamins-Minerals (ICAPS AREDS 2) CAPS, Take 1 capsule by mouth 2 (two) times daily., Disp: , Rfl:  .  ondansetron  (ZOFRAN) 8 MG tablet, Take 1 tablet (8 mg total) by mouth 2 (two) times daily as needed for refractory nausea / vomiting. Start on day 3 after carboplatin chemo. (Patient not taking: Reported on 09/29/2017), Disp: 30 tablet, Rfl: 1 .  Potassium 99 MG TABS, Take 99 mg by mouth at bedtime., Disp: , Rfl:  .  prochlorperazine (COMPAZINE) 10 MG tablet, Take 1 tablet (10 mg total) by mouth every 6 (six) hours as needed (Nausea or vomiting). (Patient not taking: Reported on 09/29/2017), Disp: 30 tablet, Rfl: 1 .  psyllium (METAMUCIL) 58.6 % packet, Take 1 packet by mouth daily as needed (constipation). , Disp: , Rfl:  .  tamsulosin (FLOMAX) 0.4 MG CAPS capsule, Take 1 capsule (0.4 mg total) by mouth daily. (Patient taking differently: Take 0.4 mg by mouth every evening. ), Disp: 90 capsule, Rfl: 3  Physical exam:  Vitals:   11/03/17 0900  BP: (!) 148/77  Pulse: 62  Resp: 18  Temp: (!) 97.1 F (36.2 C)  TempSrc: Tympanic  SpO2: 95%  Weight: 195 lb 1.6 oz (88.5 kg)  Height: 6' (1.829 m)   Physical Exam  Constitutional: He is oriented to person, place, and time. He appears well-developed and well-nourished.  HENT:  Head: Normocephalic and atraumatic.  Eyes: Pupils are equal, round, and reactive to light. EOM are normal.  Neck: Normal range of motion.  Cardiovascular: Normal rate, regular rhythm and normal heart sounds.  Pulmonary/Chest: Effort normal and breath sounds normal.  Abdominal: Soft. Bowel sounds are normal.  Neurological: He is alert and oriented to person, place, and time.  Skin: Skin is warm and dry.     CMP Latest Ref Rng & Units 10/20/2017  Glucose 70 - 99 mg/dL 134(H)  BUN 8 - 23 mg/dL 23  Creatinine 0.61 - 1.24 mg/dL 1.12  Sodium 135 - 145 mmol/L 135  Potassium 3.5 - 5.1 mmol/L 3.9  Chloride 98 - 111 mmol/L 101  CO2 22 - 32 mmol/L 26  Calcium 8.9 - 10.3 mg/dL 8.9  Total Protein 6.5 - 8.1 g/dL -  Total Bilirubin 0.3 - 1.2 mg/dL -  Alkaline Phos 38 - 126 U/L -  AST 15 -  41 U/L -  ALT 0 - 44 U/L -   CBC Latest Ref Rng & Units 10/20/2017  WBC 3.8 - 10.6 K/uL 4.5  Hemoglobin 13.0 - 18.0 g/dL 12.5(L)  Hematocrit 40.0 - 52.0 %  35.4(L)  Platelets 150 - 440 K/uL 193      Assessment and plan- Patient is a 84 y.o. male urothelial carcinoma likely stage [id number] metastases to obturator LN. Left hilar and RUL lung nodule is indeterminate.  He is here for on treatment assessment prior to cycle 2-day 15 of gemcitabine and carboplatin  Counts are okay to proceed with cycle 2-day 15 of carboplatin and gemcitabine today.  He has not been able to tolerate 2-week on 1 week off regimen because of chemo-induced neutropenia.  I will therefore see him back in 2 weeks time with CBC and CMP prior to cycle 3-day 1 of chemotherapy.  Plan is to repeat scans after cycle 3-day 15   Visit Diagnosis 1. 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 Encounter for antineoplastic chemotherapy      Dr. Randa Evens, MD, MPH [facility] [phone] 11/03/2017 11:18 AM

## 2017-11-03 ENCOUNTER — Inpatient Hospital Stay: Payer: Medicare HMO

## 2017-11-03 ENCOUNTER — Encounter: Payer: Self-pay | Admitting: Oncology

## 2017-11-03 ENCOUNTER — Inpatient Hospital Stay (HOSPITAL_BASED_OUTPATIENT_CLINIC_OR_DEPARTMENT_OTHER): Payer: Medicare HMO | Admitting: Oncology

## 2017-11-03 VITALS — BP 148/77 | HR 62 | Temp 97.1°F | Resp 18 | Ht 72.0 in | Wt 195.1 lb

## 2017-11-03 DIAGNOSIS — C661 Malignant neoplasm of right ureter: Secondary | ICD-10-CM

## 2017-11-03 DIAGNOSIS — Z8042 Family history of malignant neoplasm of prostate: Secondary | ICD-10-CM

## 2017-11-03 DIAGNOSIS — Z79899 Other long term (current) drug therapy: Secondary | ICD-10-CM

## 2017-11-03 DIAGNOSIS — I10 Essential (primary) hypertension: Secondary | ICD-10-CM | POA: Diagnosis not present

## 2017-11-03 DIAGNOSIS — C669 Malignant neoplasm of unspecified ureter: Secondary | ICD-10-CM

## 2017-11-03 DIAGNOSIS — Z85828 Personal history of other malignant neoplasm of skin: Secondary | ICD-10-CM

## 2017-11-03 DIAGNOSIS — Z5111 Encounter for antineoplastic chemotherapy: Secondary | ICD-10-CM | POA: Diagnosis not present

## 2017-11-03 DIAGNOSIS — Z8551 Personal history of malignant neoplasm of bladder: Secondary | ICD-10-CM

## 2017-11-03 DIAGNOSIS — R918 Other nonspecific abnormal finding of lung field: Secondary | ICD-10-CM

## 2017-11-03 LAB — CBC WITH DIFFERENTIAL/PLATELET
BASOS ABS: 0 10*3/uL (ref 0–0.1)
Basophils Relative: 1 %
EOS PCT: 2 %
Eosinophils Absolute: 0.1 10*3/uL (ref 0–0.7)
HCT: 34.1 % — ABNORMAL LOW (ref 40.0–52.0)
Hemoglobin: 11.9 g/dL — ABNORMAL LOW (ref 13.0–18.0)
LYMPHS PCT: 21 %
Lymphs Abs: 0.9 10*3/uL — ABNORMAL LOW (ref 1.0–3.6)
MCH: 31 pg (ref 26.0–34.0)
MCHC: 34.8 g/dL (ref 32.0–36.0)
MCV: 89.1 fL (ref 80.0–100.0)
MONO ABS: 0.6 10*3/uL (ref 0.2–1.0)
MONOS PCT: 14 %
Neutro Abs: 2.7 10*3/uL (ref 1.4–6.5)
Neutrophils Relative %: 62 %
PLATELETS: 211 10*3/uL (ref 150–440)
RBC: 3.82 MIL/uL — ABNORMAL LOW (ref 4.40–5.90)
RDW: 14 % (ref 11.5–14.5)
WBC: 4.3 10*3/uL (ref 3.8–10.6)

## 2017-11-03 LAB — COMPREHENSIVE METABOLIC PANEL
ALBUMIN: 3.5 g/dL (ref 3.5–5.0)
ALT: 22 U/L (ref 0–44)
AST: 33 U/L (ref 15–41)
Alkaline Phosphatase: 49 U/L (ref 38–126)
Anion gap: 6 (ref 5–15)
BILIRUBIN TOTAL: 0.6 mg/dL (ref 0.3–1.2)
BUN: 18 mg/dL (ref 8–23)
CO2: 27 mmol/L (ref 22–32)
Calcium: 8.8 mg/dL — ABNORMAL LOW (ref 8.9–10.3)
Chloride: 102 mmol/L (ref 98–111)
Creatinine, Ser: 1.2 mg/dL (ref 0.61–1.24)
GFR calc Af Amer: >60 mL/min (ref >60)
GFR calc non Af Amer: 54 mL/min — ABNORMAL LOW (ref >60)
Glucose, Bld: 103 mg/dL — ABNORMAL HIGH (ref 70–99)
POTASSIUM: 4.2 mmol/L (ref 3.5–5.1)
SODIUM: 135 mmol/L (ref 135–145)
TOTAL PROTEIN: 6.2 g/dL — AB (ref 6.5–8.1)

## 2017-11-03 MED ORDER — PALONOSETRON HCL INJECTION 0.25 MG/5ML
0.2500 mg | Freq: Once | INTRAVENOUS | Status: AC
  Administered 2017-11-03: 0.25 mg via INTRAVENOUS
  Filled 2017-11-03: qty 5

## 2017-11-03 MED ORDER — SODIUM CHLORIDE 0.9 % IV SOLN
Freq: Once | INTRAVENOUS | Status: AC
  Administered 2017-11-03: 10:00:00 via INTRAVENOUS
  Filled 2017-11-03: qty 250

## 2017-11-03 MED ORDER — SODIUM CHLORIDE 0.9% FLUSH
10.0000 mL | INTRAVENOUS | Status: DC | PRN
  Administered 2017-11-03: 10 mL via INTRAVENOUS
  Filled 2017-11-03: qty 10

## 2017-11-03 MED ORDER — SODIUM CHLORIDE 0.9 % IV SOLN: 10.0000 mg | Freq: Once | INTRAVENOUS | Status: DC

## 2017-11-03 MED ORDER — HEPARIN SOD (PORK) LOCK FLUSH 100 UNIT/ML IV SOLN
500.0000 [IU] | Freq: Once | INTRAVENOUS | Status: AC
  Administered 2017-11-03: 500 [IU] via INTRAVENOUS
  Filled 2017-11-03: qty 5

## 2017-11-03 MED ORDER — SODIUM CHLORIDE 0.9 % IV SOLN
170.0000 mg | Freq: Once | INTRAVENOUS | Status: AC
  Administered 2017-11-03: 170 mg via INTRAVENOUS
  Filled 2017-11-03: qty 17

## 2017-11-03 MED ORDER — DEXAMETHASONE SODIUM PHOSPHATE 10 MG/ML IJ SOLN
10.0000 mg | Freq: Once | INTRAMUSCULAR | Status: AC
  Administered 2017-11-03: 10 mg via INTRAVENOUS
  Filled 2017-11-03: qty 1

## 2017-11-03 MED ORDER — SODIUM CHLORIDE 0.9 % IV SOLN
1600.0000 mg | Freq: Once | INTRAVENOUS | Status: AC
  Administered 2017-11-03: 1600 mg via INTRAVENOUS
  Filled 2017-11-03: qty 26.3

## 2017-11-03 NOTE — Progress Notes (Signed)
No new changes noted today 

## 2017-11-17 ENCOUNTER — Inpatient Hospital Stay: Payer: Medicare HMO

## 2017-11-17 ENCOUNTER — Inpatient Hospital Stay: Payer: Medicare HMO | Attending: Oncology

## 2017-11-17 ENCOUNTER — Inpatient Hospital Stay (HOSPITAL_BASED_OUTPATIENT_CLINIC_OR_DEPARTMENT_OTHER): Payer: Medicare HMO | Admitting: Oncology

## 2017-11-17 ENCOUNTER — Encounter: Payer: Self-pay | Admitting: Oncology

## 2017-11-17 VITALS — BP 121/73 | HR 66 | Temp 98.3°F | Resp 18 | Ht 72.0 in | Wt 195.6 lb

## 2017-11-17 DIAGNOSIS — Z8249 Family history of ischemic heart disease and other diseases of the circulatory system: Secondary | ICD-10-CM | POA: Diagnosis not present

## 2017-11-17 DIAGNOSIS — Z5111 Encounter for antineoplastic chemotherapy: Secondary | ICD-10-CM | POA: Diagnosis present

## 2017-11-17 DIAGNOSIS — F419 Anxiety disorder, unspecified: Secondary | ICD-10-CM | POA: Diagnosis not present

## 2017-11-17 DIAGNOSIS — C669 Malignant neoplasm of unspecified ureter: Secondary | ICD-10-CM

## 2017-11-17 DIAGNOSIS — Z79899 Other long term (current) drug therapy: Secondary | ICD-10-CM

## 2017-11-17 DIAGNOSIS — Z8042 Family history of malignant neoplasm of prostate: Secondary | ICD-10-CM | POA: Diagnosis not present

## 2017-11-17 DIAGNOSIS — R918 Other nonspecific abnormal finding of lung field: Secondary | ICD-10-CM | POA: Diagnosis not present

## 2017-11-17 DIAGNOSIS — Z85828 Personal history of other malignant neoplasm of skin: Secondary | ICD-10-CM | POA: Diagnosis not present

## 2017-11-17 DIAGNOSIS — I10 Essential (primary) hypertension: Secondary | ICD-10-CM

## 2017-11-17 DIAGNOSIS — C661 Malignant neoplasm of right ureter: Secondary | ICD-10-CM | POA: Insufficient documentation

## 2017-11-17 DIAGNOSIS — D6481 Anemia due to antineoplastic chemotherapy: Secondary | ICD-10-CM | POA: Insufficient documentation

## 2017-11-17 LAB — CBC WITH DIFFERENTIAL/PLATELET
Basophils Absolute: 0 10*3/uL (ref 0–0.1)
Basophils Relative: 1 %
Eosinophils Absolute: 0.1 10*3/uL (ref 0–0.7)
Eosinophils Relative: 2 %
HEMATOCRIT: 32.9 % — AB (ref 40.0–52.0)
HEMOGLOBIN: 11.3 g/dL — AB (ref 13.0–18.0)
LYMPHS ABS: 1.1 10*3/uL (ref 1.0–3.6)
Lymphocytes Relative: 26 %
MCH: 31.1 pg (ref 26.0–34.0)
MCHC: 34.4 g/dL (ref 32.0–36.0)
MCV: 90.5 fL (ref 80.0–100.0)
MONO ABS: 0.5 10*3/uL (ref 0.2–1.0)
MONOS PCT: 13 %
NEUTROS ABS: 2.4 10*3/uL (ref 1.4–6.5)
NEUTROS PCT: 58 %
PLATELETS: 215 10*3/uL (ref 150–440)
RBC: 3.64 MIL/uL — ABNORMAL LOW (ref 4.40–5.90)
RDW: 15.2 % — ABNORMAL HIGH (ref 11.5–14.5)
WBC: 4.1 10*3/uL (ref 3.8–10.6)

## 2017-11-17 LAB — BASIC METABOLIC PANEL
ANION GAP: 7 (ref 5–15)
BUN: 22 mg/dL (ref 8–23)
CHLORIDE: 100 mmol/L (ref 98–111)
CO2: 26 mmol/L (ref 22–32)
CREATININE: 1.27 mg/dL — AB (ref 0.61–1.24)
Calcium: 8.8 mg/dL — ABNORMAL LOW (ref 8.9–10.3)
GFR calc Af Amer: 58 mL/min — ABNORMAL LOW (ref >60)
GFR calc non Af Amer: 50 mL/min — ABNORMAL LOW (ref >60)
GLUCOSE: 152 mg/dL — AB (ref 70–99)
Potassium: 3.7 mmol/L (ref 3.5–5.1)
Sodium: 133 mmol/L — ABNORMAL LOW (ref 135–145)

## 2017-11-17 MED ORDER — SODIUM CHLORIDE 0.9 % IV SOLN: 10.0000 mg | Freq: Once | INTRAVENOUS | Status: DC

## 2017-11-17 MED ORDER — PALONOSETRON HCL INJECTION 0.25 MG/5ML
0.2500 mg | Freq: Once | INTRAVENOUS | Status: AC
  Administered 2017-11-17: 0.25 mg via INTRAVENOUS
  Filled 2017-11-17: qty 5

## 2017-11-17 MED ORDER — HEPARIN SOD (PORK) LOCK FLUSH 100 UNIT/ML IV SOLN
500.0000 [IU] | Freq: Once | INTRAVENOUS | Status: AC
  Administered 2017-11-17: 500 [IU] via INTRAVENOUS
  Filled 2017-11-17: qty 5

## 2017-11-17 MED ORDER — DEXAMETHASONE SODIUM PHOSPHATE 10 MG/ML IJ SOLN
10.0000 mg | Freq: Once | INTRAMUSCULAR | Status: AC
  Administered 2017-11-17: 10 mg via INTRAVENOUS
  Filled 2017-11-17: qty 1

## 2017-11-17 MED ORDER — SODIUM CHLORIDE 0.9 % IV SOLN
1600.0000 mg | Freq: Once | INTRAVENOUS | Status: AC
  Administered 2017-11-17: 1600 mg via INTRAVENOUS
  Filled 2017-11-17: qty 26.3

## 2017-11-17 MED ORDER — SODIUM CHLORIDE 0.9% FLUSH
10.0000 mL | Freq: Once | INTRAVENOUS | Status: AC
  Administered 2017-11-17: 10 mL via INTRAVENOUS
  Filled 2017-11-17: qty 10

## 2017-11-17 MED ORDER — SODIUM CHLORIDE 0.9 % IV SOLN
170.0000 mg | Freq: Once | INTRAVENOUS | Status: AC
  Administered 2017-11-17: 170 mg via INTRAVENOUS
  Filled 2017-11-17: qty 17

## 2017-11-17 MED ORDER — SODIUM CHLORIDE 0.9 % IV SOLN
Freq: Once | INTRAVENOUS | Status: AC
  Administered 2017-11-17: 14:00:00 via INTRAVENOUS
  Filled 2017-11-17: qty 250

## 2017-11-17 NOTE — Progress Notes (Signed)
Pt had lost sleep for 3 days, , legs hurt one day but he did walk and mow his grass. Nausea one day and he took nausea pill and it got better

## 2017-11-19 NOTE — Progress Notes (Signed)
Hematology/Oncology Consult note [facility]  Telephone:([phone] [phone]  Patient Care Team: Idelle Crouch, MD as PCP - General (Internal Medicine)   Name of the patient: Anthony Gonzales  [id number]  [birth year]   Date of visit: 11/19/17  Diagnosis- Metastatic urothelial carcinoma with possible mets to the obturator node. Indeterminate hilar lymph nodes and LUL lung nodule  Chief complaint/ Reason for visit-on treatment assessment prior to cycle 3-day 1 of carboplatin and Gemzar  Heme/Onc history: patient is a 84 year old male with past medical history significant for hypertension and long-standing history of superficial bladder cancer for which he sees [name]. He has undergone TURBT as well as ureteroscopy since 2017 along with mitomycin as well in the past. Most recently he underwent CT abdomen on 08/06/2017 which showed a soft tissue mass in the right renal pelvis concerning for upper tract urothelial neoplasm. Soft tissue fullness at the right ureterovesical junction with proximal right hydroureteronephrosis. Several millimeter attenuation lesion in the pancreatic head.  He underwent diagnostic ureteroscopy and was found to have 2 high-grade lesions within his right kidney. There was a nodular high-grade appearing lesion in the right anterior renal pelvis. There was also a second ureteral tumor fungating from the right ureteral orifice extending into the distal ureter also consistent with high-grade invasive urothelial carcinoma. Muscle invasion could not be assessed. He also underwent a CT chest which showed a rounded nodule in the right upper lobe measuring 14 mm concerning for metastases. 2 other 3 mm lesions were also noted in the left upper lobe likely benign  Plan initially was neoadjuvant chemotherapy followed by possible surgery but given the presence of lung lesion patient has been referred to oncology for the  same.  PET/CT on 09/21/17 showed:IMPRESSION: 1. Hypermetabolic right obturator lymph node is most indicative of metastatic disease. 2. Mildly hypermetabolic left hilar lymph nodes are nonspecific. Continued attention on follow-up exams is warranted. 3. Right upper lobe pulmonary nodule shows metabolism after just above blood pool and is therefore indeterminate. Continued attention on follow-up exams is warranted. 4. Aortic atherosclerosis (ICD10-170.0). Coronary artery calcification. 5. Cholelithiasis   Plan is to proceed with 4-6 cycles of carboplatin/ gemzar1 week on and one-week off as patient could not tolerate 2-week on and one week off regimen. Cycle 1 started on 09/22/2017   Interval history-he had one episode of mild nausea which was controlled with nausea medication.  He complained of left lower extremity pain about 3 days after chemotherapy which ultimately subsided.  He did move his lawn right before that and he is not sure if this was precipitated because of that.  Other than that he is tolerating chemotherapy well without any significant side effects  ECOG PS- 1 Pain scale- 0 Opioid associated constipation- no  Review of systems- Review of Systems  Constitutional: Negative for chills, fever, malaise/fatigue and weight loss.  HENT: Negative for congestion, ear discharge and nosebleeds.   Eyes: Negative for blurred vision.  Respiratory: Negative for cough, hemoptysis, sputum production, shortness of breath and wheezing.   Cardiovascular: Negative for chest pain, palpitations, orthopnea and claudication.  Gastrointestinal: Negative for abdominal pain, blood in stool, constipation, diarrhea, heartburn, melena, nausea and vomiting.  Genitourinary: Negative for dysuria, flank pain, frequency, hematuria and urgency.  Musculoskeletal: Negative for back pain, joint pain and myalgias.  Skin: Negative for rash.  Neurological: Negative for dizziness, tingling, focal weakness,  seizures, weakness and headaches.  Endo/Heme/Allergies: Does not bruise/bleed easily.  Psychiatric/Behavioral: Negative for  depression and suicidal ideas. The patient does not have insomnia.        Allergies  Allergen Reactions  . Demerol [Meperidine] Nausea And Vomiting  . Lipitor [Atorvastatin] Swelling  . Sulfa Antibiotics Nausea And Vomiting and Rash     Past Medical History:  Diagnosis Date  . Arthritis   . Benign fibroma of prostate 08/23/2013  . BPH (benign prostatic hyperplasia)   . Calculus of kidney 08/23/2013  . Cancer [facility])    bladder cancer and skin cancer  . Glaucoma    no drops in 3 mo pressure good, pt denies glaucoma, eye pressure has been measuring alright.  . History of kidney stones   . HLD (hyperlipidemia)   . HOH (hard of hearing)    Left Hearing Aid  . HTN (hypertension) 12/26/2014  . Hypertension   . Hyponatremia 12/26/2014  . Migraines    history of migraines when he was younger.  [name] Restless leg syndrome   . Sinus drainage   . UTI (lower urinary tract infection) 12/26/2014  . Vertigo      Past Surgical History:  Procedure Laterality Date  . COLONOSCOPY    . CYSTOSCOPY W/ RETROGRADES Right 01/30/2015   Procedure: CYSTOSCOPY WITH RETROGRADE PYELOGRAM;  Surgeon: [name], MD;  Location: [facility];  Service: Urology;  Laterality: Right;  . CYSTOSCOPY W/ RETROGRADES Bilateral 02/26/2016   Procedure: CYSTOSCOPY WITH RETROGRADE PYELOGRAM;  Surgeon: [name], MD;  Location: [facility];  Service: Urology;  Laterality: Bilateral;  . CYSTOSCOPY W/ URETERAL STENT PLACEMENT Right 08/20/2015   Procedure: CYSTOSCOPY WITH RETROGRADE PYELOGRAM/POSSIBLE URETERAL STENT PLACEMENT/BLADDER BIOPSY;  Surgeon: [name], MD;  Location: [facility];  Service: Urology;  Laterality: Right;  . CYSTOSCOPY W/ URETERAL STENT PLACEMENT Right 09/12/2015   Procedure: CYSTOSCOPY WITH STENT REPLACEMENT;  Surgeon: [name], MD;  Location: [facility];  Service: Urology;   Laterality: Right;  . CYSTOSCOPY WITH BIOPSY Right 09/12/2015   Procedure: CYSTOSCOPY WITH BLADDER AND URETERAL BIOPSY;  Surgeon: [name], MD;  Location: [facility];  Service: Urology;  Laterality: Right;  . CYSTOSCOPY WITH STENT PLACEMENT Right 01/30/2015   Procedure: CYSTOSCOPY WITH STENT PLACEMENT;  Surgeon: [name], MD;  Location: [facility];  Service: Urology;  Laterality: Right;  . CYSTOSCOPY/URETEROSCOPY/HOLMIUM LASER/STENT PLACEMENT Right 08/19/2017   Procedure: CYSTOSCOPY/URETEROSCOPY/HOLMIUM LASER/STENT PLACEMENT;  Surgeon: [name], MD;  Location: [facility];  Service: Urology;  Laterality: Right;  . EYE SURGERY Bilateral    Cataract Extraction with IOL  . goiter removal    . HOLMIUM LASER APPLICATION N/A 2/72/5366   Procedure:  HOLMIUM LASER APPLICATION;  Surgeon: [name], MD;  Location: [facility];  Service: Urology;  Laterality: N/A;  . PORTA CATH INSERTION N/A 09/23/2017   Procedure: PORTA CATH INSERTION;  Surgeon: Algernon Huxley, MD;  Location: [facility];  Service: Cardiovascular;  Laterality: N/A;  . SPERMATOCELECTOMY    . TONSILLECTOMY    . TRANSURETHRAL RESECTION OF BLADDER TUMOR N/A 02/26/2016   Procedure: TRANSURETHRAL RESECTION OF BLADDER TUMOR (TURBT);  Surgeon: [name], MD;  Location: [facility];  Service: Urology;  Laterality: N/A;  . TRANSURETHRAL RESECTION OF BLADDER TUMOR WITH MITOMYCIN-C N/A 09/12/2015   Procedure: TRANSURETHRAL RESECTION OF BLADDER TUMOR ;  Surgeon: [name], MD;  Location: [facility];  Service: Urology;  Laterality: N/A;  . TRANSURETHRAL RESECTION OF BLADDER TUMOR WITH MITOMYCIN-C N/A 03/24/2016   Procedure: TRANSURETHRAL RESECTION OF BLADDER TUMOR WITH MITOMYCIN-C  (SMALL);  Surgeon: [name], MD;  Location: [facility];  Service: Urology;  Laterality: N/A;  . URETERAL BIOPSY Right 08/19/2017   Procedure: Renal Mass BIOPSY;  Surgeon: [name], MD;  Location: [facility];  Service: Urology;  Laterality:  Right;  . URETEROSCOPY Right 01/30/2015   Procedure: URETEROSCOPY/ WITH BIOPSY AND CYTOLOGY BRUSHING;  Surgeon: [name], MD;  Location: [facility];  Service: Urology;  Laterality: Right;  . URETEROSCOPY Right 08/20/2015   Procedure: URETEROSCOPY;  Surgeon: [name], MD;  Location: [facility];  Service: Urology;  Laterality: Right;  . URETEROSCOPY Right 09/12/2015   Procedure: URETEROSCOPY;  Surgeon: [name], MD;  Location: [facility];  Service: Urology;  Laterality: Right;  . URETEROSCOPY Right 02/26/2016   Procedure: URETEROSCOPY;  Surgeon: [name], MD;  Location: [facility];  Service: Urology;  Laterality: Right;    Social History   Socioeconomic History  . Marital status: Married    Spouse name: Not on file  . Number of children: Not on file  . Years of education: Not on file  . Highest education level: Not on file  Occupational History  . Not on file  Social Needs  . Financial resource strain: Not on file  . Food insecurity:    Worry: Not on file    Inability: Not on file  . Transportation needs:    Medical: Not on file    Non-medical: Not on file  Tobacco Use  . Smoking status: Never Smoker  . Smokeless tobacco: Never Used  Substance and Sexual Activity  . Alcohol use: No    Alcohol/week: 0.0 standard drinks  . Drug use: No  . Sexual activity: Not on file  Lifestyle  . Physical activity:    Days per week: Not on file    Minutes per session: Not on file  . Stress: Not on file  Relationships  . Social connections:    Talks on phone: Not on file    Gets together: Not on file    Attends religious service: Not on file    Active member of club or organization: Not on file    Attends meetings of clubs or organizations: Not on file    Relationship status: Not on file  . Intimate partner violence:    Fear of current or ex partner: Not on file    Emotionally abused: Not on file    Physically abused: Not on file    Forced sexual activity: Not on file   Other Topics Concern  . Not on file  Social History Narrative  . Not on file    Family History  Problem Relation Age of Onset  . Hypertension Mother   . Hypertension Father   . Prostate cancer Brother   . Kidney disease Neg Hx   . Kidney cancer Neg Hx   . Bladder Cancer Neg Hx      Current Outpatient Medications:  .  acetaminophen (TYLENOL) 500 MG tablet, Take 1,000 mg by mouth daily as needed for moderate pain. , Disp: , Rfl:  .  benazepril-hydrochlorthiazide (LOTENSIN HCT) 20-12.5 MG tablet, Take 1 tablet by mouth daily. , Disp: , Rfl:  .  dexamethasone (DECADRON) 4 MG tablet, Take 2 tablets (8 mg total) by mouth daily. Start the day after carboplatin chemotherapy for 2 days., Disp: 30 tablet, Rfl: 1 .  diazepam (VALIUM) 2 MG tablet, Take 2 mg by mouth every 12 (twelve) hours as needed for anxiety., Disp: , Rfl:  .  docusate sodium (COLACE) 100 MG capsule, Take 100 mg by  mouth daily as needed for mild constipation., Disp: , Rfl:  .  fenofibrate micronized (LOFIBRA) 134 MG capsule, Take 134 mg by mouth daily before breakfast. , Disp: , Rfl:  .  fluticasone (FLONASE) 50 MCG/ACT nasal spray, Place 2 sprays into both nostrils at bedtime., Disp: , Rfl:  .  hydrocortisone cream 1 %, Apply 1 application topically daily as needed for itching., Disp: , Rfl:  .  lidocaine-prilocaine (EMLA) cream, Apply to affected area once, Disp: 30 g, Rfl: 3 .  Multiple Vitamin (MULTIVITAMIN WITH MINERALS) TABS tablet, Take 1 tablet by mouth daily., Disp: , Rfl:  .  Multiple Vitamins-Minerals (ICAPS AREDS 2) CAPS, Take 1 capsule by mouth 2 (two) times daily., Disp: , Rfl:  .  Potassium 99 MG TABS, Take 99 mg by mouth at bedtime., Disp: , Rfl:  .  prochlorperazine (COMPAZINE) 10 MG tablet, Take 1 tablet (10 mg total) by mouth every 6 (six) hours as needed (Nausea or vomiting)., Disp: 30 tablet, Rfl: 1 .  psyllium (METAMUCIL) 58.6 % packet, Take 1 packet by mouth daily as needed (constipation). , Disp: ,  Rfl:  .  tamsulosin (FLOMAX) 0.4 MG CAPS capsule, Take 1 capsule (0.4 mg total) by mouth daily. (Patient taking differently: Take 0.4 mg by mouth every evening. ), Disp: 90 capsule, Rfl: 3 .  HYDROcodone-acetaminophen (NORCO/VICODIN) 5-325 MG tablet, Take 1-2 tablets by mouth every 6 (six) hours as needed for moderate pain. (Patient not taking: Reported on 09/29/2017), Disp: 10 tablet, Rfl: 0 .  LORazepam (ATIVAN) 0.5 MG tablet, Take 1 tablet (0.5 mg total) by mouth every 6 (six) hours as needed (Nausea or vomiting). (Patient not taking: Reported on 11/03/2017), Disp: 30 tablet, Rfl: 0 .  ondansetron (ZOFRAN) 8 MG tablet, Take 1 tablet (8 mg total) by mouth 2 (two) times daily as needed for refractory nausea / vomiting. Start on day 3 after carboplatin chemo. (Patient not taking: Reported on 09/29/2017), Disp: 30 tablet, Rfl: 1  Physical exam:  Vitals:   11/17/17 1307 11/17/17 1311  BP: (!) 150/88 121/73  Pulse: 66   Resp: 18   Temp: 98.3 F (36.8 C)   TempSrc: Tympanic   Weight: 195 lb 9.6 oz (88.7 kg)   Height: 6' (1.829 m)    Physical Exam  Constitutional: He is oriented to person, place, and time. He appears well-developed and well-nourished.  HENT:  Head: Normocephalic and atraumatic.  Eyes: Pupils are equal, round, and reactive to light. EOM are normal.  Neck: Normal range of motion.  Cardiovascular: Normal rate, regular rhythm and normal heart sounds.  Pulmonary/Chest: Effort normal and breath sounds normal.  Abdominal: Soft. Bowel sounds are normal.  Neurological: He is alert and oriented to person, place, and time.  Skin: Skin is warm and dry.     CMP Latest Ref Rng & Units 11/17/2017  Glucose 70 - 99 mg/dL 152(H)  BUN 8 - 23 mg/dL 22  Creatinine 0.61 - 1.24 mg/dL 1.27(H)  Sodium 135 - 145 mmol/L 133(L)  Potassium 3.5 - 5.1 mmol/L 3.7  Chloride 98 - 111 mmol/L 100  CO2 22 - 32 mmol/L 26  Calcium 8.9 - 10.3 mg/dL 8.8(L)  Total Protein 6.5 - 8.1 g/dL -  Total Bilirubin 0.3  - 1.2 mg/dL -  Alkaline Phos 38 - 126 U/L -  AST 15 - 41 U/L -  ALT 0 - 44 U/L -   CBC Latest Ref Rng & Units 11/17/2017  WBC 3.8 - 10.6 K/uL 4.1  Hemoglobin 13.0 - 18.0 g/dL 11.3(L)  Hematocrit 40.0 - 52.0 % 32.9(L)  Platelets 150 - 440 K/uL 215      Assessment and plan- Patient is a 84 y.o. male urothelial carcinoma likely stage [id number] metastases to obturator LN. Left hilar and RUL lung nodule is indeterminate.  He is here for on treatment assessment prior to cycle 3-day 1 of gemcitabine and carboplatin  Counts are okay to proceed with cycle 3-day 1 of chemotherapy today.  His creatinine is mildly elevated at 1.27 which we will continue to monitor.  Patient does state that he drinks plenty of fluids at home and therefore I would not be giving him any IV fluids today.  I will see him back in 2 weeks time with CBC and CMP for cycle 3-day 15 of chemotherapy.  Plan is to repeat scans after 3 cycles   Visit Diagnosis 1. 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 Encounter for antineoplastic chemotherapy      Dr. Randa Evens, MD, MPH [facility] [phone] 11/19/2017 8:28 AM

## 2017-11-30 ENCOUNTER — Other Ambulatory Visit: Payer: Self-pay

## 2017-11-30 DIAGNOSIS — C669 Malignant neoplasm of unspecified ureter: Secondary | ICD-10-CM

## 2017-12-01 ENCOUNTER — Inpatient Hospital Stay: Payer: Medicare HMO

## 2017-12-01 ENCOUNTER — Encounter: Payer: Self-pay | Admitting: Oncology

## 2017-12-01 ENCOUNTER — Inpatient Hospital Stay (HOSPITAL_BASED_OUTPATIENT_CLINIC_OR_DEPARTMENT_OTHER): Payer: Medicare HMO | Admitting: Oncology

## 2017-12-01 VITALS — BP 128/74 | HR 69 | Temp 97.7°F | Resp 18 | Ht 72.0 in | Wt 197.5 lb

## 2017-12-01 DIAGNOSIS — R918 Other nonspecific abnormal finding of lung field: Secondary | ICD-10-CM | POA: Diagnosis not present

## 2017-12-01 DIAGNOSIS — C669 Malignant neoplasm of unspecified ureter: Secondary | ICD-10-CM

## 2017-12-01 DIAGNOSIS — Z5111 Encounter for antineoplastic chemotherapy: Secondary | ICD-10-CM

## 2017-12-01 DIAGNOSIS — T451X5A Adverse effect of antineoplastic and immunosuppressive drugs, initial encounter: Secondary | ICD-10-CM

## 2017-12-01 DIAGNOSIS — Z8042 Family history of malignant neoplasm of prostate: Secondary | ICD-10-CM

## 2017-12-01 DIAGNOSIS — C661 Malignant neoplasm of right ureter: Secondary | ICD-10-CM | POA: Diagnosis not present

## 2017-12-01 DIAGNOSIS — I10 Essential (primary) hypertension: Secondary | ICD-10-CM | POA: Diagnosis not present

## 2017-12-01 DIAGNOSIS — Z8249 Family history of ischemic heart disease and other diseases of the circulatory system: Secondary | ICD-10-CM

## 2017-12-01 DIAGNOSIS — D6481 Anemia due to antineoplastic chemotherapy: Secondary | ICD-10-CM

## 2017-12-01 DIAGNOSIS — F419 Anxiety disorder, unspecified: Secondary | ICD-10-CM

## 2017-12-01 DIAGNOSIS — Z79899 Other long term (current) drug therapy: Secondary | ICD-10-CM

## 2017-12-01 DIAGNOSIS — Z85828 Personal history of other malignant neoplasm of skin: Secondary | ICD-10-CM

## 2017-12-01 LAB — COMPREHENSIVE METABOLIC PANEL
ALBUMIN: 3.6 g/dL (ref 3.5–5.0)
ALT: 24 U/L (ref 0–44)
AST: 36 U/L (ref 15–41)
Alkaline Phosphatase: 46 U/L (ref 38–126)
Anion gap: 8 (ref 5–15)
BUN: 17 mg/dL (ref 8–23)
CO2: 26 mmol/L (ref 22–32)
Calcium: 8.9 mg/dL (ref 8.9–10.3)
Chloride: 96 mmol/L — ABNORMAL LOW (ref 98–111)
Creatinine, Ser: 1.27 mg/dL — ABNORMAL HIGH (ref 0.61–1.24)
GFR calc Af Amer: 58 mL/min — ABNORMAL LOW (ref >60)
GFR calc non Af Amer: 50 mL/min — ABNORMAL LOW (ref >60)
GLUCOSE: 175 mg/dL — AB (ref 70–99)
Potassium: 3.8 mmol/L (ref 3.5–5.1)
SODIUM: 130 mmol/L — AB (ref 135–145)
Total Bilirubin: 0.6 mg/dL (ref 0.3–1.2)
Total Protein: 6.4 g/dL — ABNORMAL LOW (ref 6.5–8.1)

## 2017-12-01 LAB — CBC WITH DIFFERENTIAL/PLATELET
Basophils Absolute: 0 10*3/uL (ref 0–0.1)
Basophils Relative: 0 %
EOS PCT: 1 %
Eosinophils Absolute: 0 10*3/uL (ref 0–0.7)
HCT: 31.4 % — ABNORMAL LOW (ref 40.0–52.0)
Hemoglobin: 10.8 g/dL — ABNORMAL LOW (ref 13.0–18.0)
LYMPHS PCT: 22 %
Lymphs Abs: 0.8 10*3/uL — ABNORMAL LOW (ref 1.0–3.6)
MCH: 31.5 pg (ref 26.0–34.0)
MCHC: 34.5 g/dL (ref 32.0–36.0)
MCV: 91.4 fL (ref 80.0–100.0)
MONO ABS: 0.5 10*3/uL (ref 0.2–1.0)
MONOS PCT: 12 %
Neutro Abs: 2.5 10*3/uL (ref 1.4–6.5)
Neutrophils Relative %: 65 %
Platelets: 233 10*3/uL (ref 150–440)
RBC: 3.44 MIL/uL — ABNORMAL LOW (ref 4.40–5.90)
RDW: 16 % — AB (ref 11.5–14.5)
WBC: 3.8 10*3/uL (ref 3.8–10.6)

## 2017-12-01 MED ORDER — HEPARIN SOD (PORK) LOCK FLUSH 100 UNIT/ML IV SOLN
500.0000 [IU] | Freq: Once | INTRAVENOUS | Status: AC
  Administered 2017-12-01: 500 [IU] via INTRAVENOUS

## 2017-12-01 MED ORDER — HEPARIN SOD (PORK) LOCK FLUSH 100 UNIT/ML IV SOLN
INTRAVENOUS | Status: AC
  Filled 2017-12-01: qty 5

## 2017-12-01 MED ORDER — SODIUM CHLORIDE 0.9 % IV SOLN
157.0000 mg | Freq: Once | INTRAVENOUS | Status: AC
  Administered 2017-12-01: 160 mg via INTRAVENOUS
  Filled 2017-12-01: qty 16

## 2017-12-01 MED ORDER — SODIUM CHLORIDE 0.9 % IV SOLN
Freq: Once | INTRAVENOUS | Status: AC
  Administered 2017-12-01: 14:00:00 via INTRAVENOUS
  Filled 2017-12-01: qty 250

## 2017-12-01 MED ORDER — DEXAMETHASONE SODIUM PHOSPHATE 10 MG/ML IJ SOLN
10.0000 mg | Freq: Once | INTRAMUSCULAR | Status: AC
  Administered 2017-12-01: 10 mg via INTRAVENOUS
  Filled 2017-12-01: qty 1

## 2017-12-01 MED ORDER — SODIUM CHLORIDE 0.9 % IV SOLN: 10.0000 mg | Freq: Once | INTRAVENOUS | Status: DC

## 2017-12-01 MED ORDER — SODIUM CHLORIDE 0.9 % IV SOLN
1600.0000 mg | Freq: Once | INTRAVENOUS | Status: AC
  Administered 2017-12-01: 1600 mg via INTRAVENOUS
  Filled 2017-12-01: qty 26.3

## 2017-12-01 MED ORDER — PALONOSETRON HCL INJECTION 0.25 MG/5ML
0.2500 mg | Freq: Once | INTRAVENOUS | Status: AC
  Administered 2017-12-01: 0.25 mg via INTRAVENOUS
  Filled 2017-12-01: qty 5

## 2017-12-01 NOTE — Progress Notes (Signed)
No new changes noted today 

## 2017-12-03 NOTE — Progress Notes (Signed)
Hematology/Oncology Consult note [facility]  [phone] Fax:([phone]  Patient Care Team: Idelle Crouch, MD as PCP - General (Internal Medicine)   Name of the patient: Anthony Gonzales  [id number]  1931/12/06   Date of visit: 12/03/17  Diagnosis- Metastatic urothelial carcinoma with possible mets to the obturator node. Indeterminate hilar lymph nodes and LUL lung nodule   Chief complaint/ Reason for visit-on treatment assessment prior to cycle 3-day 15 of carboplatin and Gemzar  Heme/Onc history: patient is a 84 year old male with past medical history significant for hypertension and long-standing history of superficial bladder cancer for which he sees [name]. He has undergone TURBT as well as ureteroscopy since 2017 along with mitomycin as well in the past. Most recently he underwent CT abdomen on 08/06/2017 which showed a soft tissue mass in the right renal pelvis concerning for upper tract urothelial neoplasm. Soft tissue fullness at the right ureterovesical junction with proximal right hydroureteronephrosis. Several millimeter attenuation lesion in the pancreatic head.  He underwent diagnostic ureteroscopy and was found to have 2 high-grade lesions within his right kidney. There was a nodular high-grade appearing lesion in the right anterior renal pelvis. There was also a second ureteral tumor fungating from the right ureteral orifice extending into the distal ureter also consistent with high-grade invasive urothelial carcinoma. Muscle invasion could not be assessed. He also underwent a CT chest which showed a rounded nodule in the right upper lobe measuring 14 mm concerning for metastases. 2 other 3 mm lesions were also noted in the left upper lobe likely benign  Plan initially was neoadjuvant chemotherapy followed by possible surgery but given the presence of lung lesion patient has been referred to oncology for the  same.  PET/CT on 09/21/17 showed:IMPRESSION: 1. Hypermetabolic right obturator lymph node is most indicative of metastatic disease. 2. Mildly hypermetabolic left hilar lymph nodes are nonspecific. Continued attention on follow-up exams is warranted. 3. Right upper lobe pulmonary nodule shows metabolism after just above blood pool and is therefore indeterminate. Continued attention on follow-up exams is warranted. 4. Aortic atherosclerosis (ICD10-170.0). Coronary artery calcification. 5. Cholelithiasis   Plan is to proceed with 4-6 cycles of carboplatin/ gemzar1 week on and one-week off as patient could not tolerate 2-week on and one week off regimen. Cycle 1 started on 09/22/2017   Interval history-he continues to have problems sleeping which she attributes to his ongoing anxiety especially with his upcoming scans.  He has been taking anxiety medication with some relief.  Denies any nausea or vomiting.  His appetite is good and he denies any overt fatigue  ECOG PS- 1 Pain scale- 0 Opioid associated constipation- no  Review of systems- Review of Systems  Constitutional: Negative for chills, fever, malaise/fatigue and weight loss.  HENT: Negative for congestion, ear discharge and nosebleeds.   Eyes: Negative for blurred vision.  Respiratory: Negative for cough, hemoptysis, sputum production, shortness of breath and wheezing.   Cardiovascular: Negative for chest pain, palpitations, orthopnea and claudication.  Gastrointestinal: Negative for abdominal pain, blood in stool, constipation, diarrhea, heartburn, melena, nausea and vomiting.  Genitourinary: Negative for dysuria, flank pain, frequency, hematuria and urgency.  Musculoskeletal: Negative for back pain, joint pain and myalgias.  Skin: Negative for rash.  Neurological: Negative for dizziness, tingling, focal weakness, seizures, weakness and headaches.  Endo/Heme/Allergies: Does not bruise/bleed easily.   Psychiatric/Behavioral: Negative for depression and suicidal ideas. The patient is nervous/anxious and has insomnia.       Allergies  Allergen Reactions  . Demerol [Meperidine] Nausea And Vomiting  . Lipitor [Atorvastatin] Swelling  . Sulfa Antibiotics Nausea And Vomiting and Rash     Past Medical History:  Diagnosis Date  . Arthritis   . Benign fibroma of prostate 08/23/2013  . BPH (benign prostatic hyperplasia)   . Calculus of kidney 08/23/2013  . Cancer [facility])    bladder cancer and skin cancer  . Glaucoma    no drops in 3 mo pressure good, pt denies glaucoma, eye pressure has been measuring alright.  . History of kidney stones   . HLD (hyperlipidemia)   . HOH (hard of hearing)    Left Hearing Aid  . HTN (hypertension) 12/26/2014  . Hypertension   . Hyponatremia 12/26/2014  . Migraines    history of migraines when he was younger.  [name] Restless leg syndrome   . Sinus drainage   . UTI (lower urinary tract infection) 12/26/2014  . Vertigo      Past Surgical History:  Procedure Laterality Date  . COLONOSCOPY    . CYSTOSCOPY W/ RETROGRADES Right 01/30/2015   Procedure: CYSTOSCOPY WITH RETROGRADE PYELOGRAM;  Surgeon: [name], MD;  Location: [facility];  Service: Urology;  Laterality: Right;  . CYSTOSCOPY W/ RETROGRADES Bilateral 02/26/2016   Procedure: CYSTOSCOPY WITH RETROGRADE PYELOGRAM;  Surgeon: [name], MD;  Location: [facility];  Service: Urology;  Laterality: Bilateral;  . CYSTOSCOPY W/ URETERAL STENT PLACEMENT Right 08/20/2015   Procedure: CYSTOSCOPY WITH RETROGRADE PYELOGRAM/POSSIBLE URETERAL STENT PLACEMENT/BLADDER BIOPSY;  Surgeon: [name], MD;  Location: [facility];  Service: Urology;  Laterality: Right;  . CYSTOSCOPY W/ URETERAL STENT PLACEMENT Right 09/12/2015   Procedure: CYSTOSCOPY WITH STENT REPLACEMENT;  Surgeon: [name], MD;  Location: [facility];  Service: Urology;  Laterality: Right;  . CYSTOSCOPY WITH BIOPSY Right 09/12/2015   Procedure:  CYSTOSCOPY WITH BLADDER AND URETERAL BIOPSY;  Surgeon: [name], MD;  Location: [facility];  Service: Urology;  Laterality: Right;  . CYSTOSCOPY WITH STENT PLACEMENT Right 01/30/2015   Procedure: CYSTOSCOPY WITH STENT PLACEMENT;  Surgeon: [name], MD;  Location: [facility];  Service: Urology;  Laterality: Right;  . CYSTOSCOPY/URETEROSCOPY/HOLMIUM LASER/STENT PLACEMENT Right 08/19/2017   Procedure: CYSTOSCOPY/URETEROSCOPY/HOLMIUM LASER/STENT PLACEMENT;  Surgeon: [name], MD;  Location: [facility];  Service: Urology;  Laterality: Right;  . EYE SURGERY Bilateral    Cataract Extraction with IOL  . goiter removal    . HOLMIUM LASER APPLICATION N/A 03/15/2692   Procedure:  HOLMIUM LASER APPLICATION;  Surgeon: [name], MD;  Location: [facility];  Service: Urology;  Laterality: N/A;  . PORTA CATH INSERTION N/A 09/23/2017   Procedure: PORTA CATH INSERTION;  Surgeon: Algernon Huxley, MD;  Location: [facility];  Service: Cardiovascular;  Laterality: N/A;  . SPERMATOCELECTOMY    . TONSILLECTOMY    . TRANSURETHRAL RESECTION OF BLADDER TUMOR N/A 02/26/2016   Procedure: TRANSURETHRAL RESECTION OF BLADDER TUMOR (TURBT);  Surgeon: [name], MD;  Location: [facility];  Service: Urology;  Laterality: N/A;  . TRANSURETHRAL RESECTION OF BLADDER TUMOR WITH MITOMYCIN-C N/A 09/12/2015   Procedure: TRANSURETHRAL RESECTION OF BLADDER TUMOR ;  Surgeon: [name], MD;  Location: [facility];  Service: Urology;  Laterality: N/A;  . TRANSURETHRAL RESECTION OF BLADDER TUMOR WITH MITOMYCIN-C N/A 03/24/2016   Procedure: TRANSURETHRAL RESECTION OF BLADDER TUMOR WITH MITOMYCIN-C  (SMALL);  Surgeon: [name], MD;  Location: [facility];  Service: Urology;  Laterality: N/A;  . URETERAL BIOPSY Right 08/19/2017   Procedure: Renal Mass BIOPSY;  Surgeon: [name], MD;  Location: [facility];  Service: Urology;  Laterality: Right;  . URETEROSCOPY Right 01/30/2015   Procedure: URETEROSCOPY/ WITH BIOPSY  AND CYTOLOGY BRUSHING;  Surgeon: [name], MD;  Location: [facility];  Service: Urology;  Laterality: Right;  . URETEROSCOPY Right 08/20/2015   Procedure: URETEROSCOPY;  Surgeon: [name], MD;  Location: [facility];  Service: Urology;  Laterality: Right;  . URETEROSCOPY Right 09/12/2015   Procedure: URETEROSCOPY;  Surgeon: [name], MD;  Location: [facility];  Service: Urology;  Laterality: Right;  . URETEROSCOPY Right 02/26/2016   Procedure: URETEROSCOPY;  Surgeon: [name], MD;  Location: [facility];  Service: Urology;  Laterality: Right;    Social History   Socioeconomic History  . Marital status: Married    Spouse name: Not on file  . Number of children: Not on file  . Years of education: Not on file  . Highest education level: Not on file  Occupational History  . Not on file  Social Needs  . Financial resource strain: Not on file  . Food insecurity:    Worry: Not on file    Inability: Not on file  . Transportation needs:    Medical: Not on file    Non-medical: Not on file  Tobacco Use  . Smoking status: Never Smoker  . Smokeless tobacco: Never Used  Substance and Sexual Activity  . Alcohol use: No    Alcohol/week: 0.0 standard drinks  . Drug use: No  . Sexual activity: Not on file  Lifestyle  . Physical activity:    Days per week: Not on file    Minutes per session: Not on file  . Stress: Not on file  Relationships  . Social connections:    Talks on phone: Not on file    Gets together: Not on file    Attends religious service: Not on file    Active member of club or organization: Not on file    Attends meetings of clubs or organizations: Not on file    Relationship status: Not on file  . Intimate partner violence:    Fear of current or ex partner: Not on file    Emotionally abused: Not on file    Physically abused: Not on file    Forced sexual activity: Not on file  Other Topics Concern  . Not on file  Social History Narrative  . Not on file     Family History  Problem Relation Age of Onset  . Hypertension Mother   . Hypertension Father   . Prostate cancer Brother   . Kidney disease Neg Hx   . Kidney cancer Neg Hx   . Bladder Cancer Neg Hx      Current Outpatient Medications:  .  benazepril-hydrochlorthiazide (LOTENSIN HCT) 20-12.5 MG tablet, Take 1 tablet by mouth daily. , Disp: , Rfl:  .  diazepam (VALIUM) 2 MG tablet, Take 2 mg by mouth every 12 (twelve) hours as needed for anxiety., Disp: , Rfl:  .  docusate sodium (COLACE) 100 MG capsule, Take 100 mg by mouth daily as needed for mild constipation., Disp: , Rfl:  .  fenofibrate micronized (LOFIBRA) 134 MG capsule, Take 134 mg by mouth daily before breakfast. , Disp: , Rfl:  .  fluticasone (FLONASE) 50 MCG/ACT nasal spray, Place 2 sprays into both nostrils at bedtime., Disp: , Rfl:  .  Multiple Vitamin (MULTIVITAMIN WITH MINERALS) TABS tablet, Take 1 tablet by mouth daily., Disp: , Rfl:  .  Multiple Vitamins-Minerals (ICAPS AREDS 2) CAPS, Take 1 capsule by mouth 2 (two) times daily., Disp: , Rfl:  .  Potassium 99 MG TABS, Take 99 mg by mouth at bedtime., Disp: , Rfl:  .  prochlorperazine (COMPAZINE) 10 MG tablet, Take 1 tablet (10 mg total) by mouth every 6 (six) hours as needed (Nausea or vomiting)., Disp: 30 tablet, Rfl: 1 .  psyllium (METAMUCIL) 58.6 % packet, Take 1 packet by mouth daily as needed (constipation). , Disp: , Rfl:  .  tamsulosin (FLOMAX) 0.4 MG CAPS capsule, Take 1 capsule (0.4 mg total) by mouth daily. (Patient taking differently: Take 0.4 mg by mouth every evening. ), Disp: 90 capsule, Rfl: 3 .  acetaminophen (TYLENOL) 500 MG tablet, Take 1,000 mg by mouth daily as needed for moderate pain. , Disp: , Rfl:  .  dexamethasone (DECADRON) 4 MG tablet, Take 2 tablets (8 mg total) by mouth daily. Start the day after carboplatin chemotherapy for 2 days. (Patient not taking: Reported on 12/01/2017), Disp: 30 tablet, Rfl: 1 .  HYDROcodone-acetaminophen  (NORCO/VICODIN) 5-325 MG tablet, Take 1-2 tablets by mouth every 6 (six) hours as needed for moderate pain. (Patient not taking: Reported on 09/29/2017), Disp: 10 tablet, Rfl: 0 .  hydrocortisone cream 1 %, Apply 1 application topically daily as needed for itching., Disp: , Rfl:  .  lidocaine-prilocaine (EMLA) cream, Apply to affected area once (Patient not taking: Reported on 12/01/2017), Disp: 30 g, Rfl: 3 .  LORazepam (ATIVAN) 0.5 MG tablet, Take 1 tablet (0.5 mg total) by mouth every 6 (six) hours as needed (Nausea or vomiting). (Patient not taking: Reported on 11/03/2017), Disp: 30 tablet, Rfl: 0 .  ondansetron (ZOFRAN) 8 MG tablet, Take 1 tablet (8 mg total) by mouth 2 (two) times daily as needed for refractory nausea / vomiting. Start on day 3 after carboplatin chemo. (Patient not taking: Reported on 09/29/2017), Disp: 30 tablet, Rfl: 1  Physical exam:  Vitals:   12/01/17 1302  BP: 128/74  Pulse: 69  Resp: 18  Temp: 97.7 F (36.5 C)  TempSrc: Tympanic  Weight: 197 lb 8 oz (89.6 kg)  Height: 6' (1.829 m)   Physical Exam  Constitutional: He is oriented to person, place, and time. He appears well-developed and well-nourished.  HENT:  Head: Normocephalic and atraumatic.  Eyes: Pupils are equal, round, and reactive to light. EOM are normal.  Neck: Normal range of motion.  Cardiovascular: Normal rate, regular rhythm and normal heart sounds.  Pulmonary/Chest: Effort normal and breath sounds normal.  Abdominal: Soft. Bowel sounds are normal.  Neurological: He is alert and oriented to person, place, and time.  Skin: Skin is warm and dry.     CMP Latest Ref Rng & Units 12/01/2017  Glucose 70 - 99 mg/dL 175(H)  BUN 8 - 23 mg/dL 17  Creatinine 0.61 - 1.24 mg/dL 1.27(H)  Sodium 135 - 145 mmol/L 130(L)  Potassium 3.5 - 5.1 mmol/L 3.8  Chloride 98 - 111 mmol/L 96(L)  CO2 22 - 32 mmol/L 26  Calcium 8.9 - 10.3 mg/dL 8.9  Total Protein 6.5 - 8.1 g/dL 6.4(L)  Total Bilirubin 0.3 - 1.2 mg/dL  0.6  Alkaline Phos 38 - 126 U/L 46  AST 15 - 41 U/L 36  ALT 0 - 44 U/L 24   CBC Latest Ref Rng & Units 12/01/2017  WBC 3.8 - 10.6 K/uL 3.8  Hemoglobin 13.0 - 18.0 g/dL 10.8(L)  Hematocrit 40.0 - 52.0 % 31.4(L)  Platelets 150 - 440  K/uL 233      Assessment and plan- Patient is a 84 y.o. male urothelial carcinoma likely stage [id number] metastases to obturator LN. Left hilar and RUL lung nodule is indeterminate.  He is here for on treatment assessment prior to cycle 3-day 15 of gemcitabine and carboplatin  He has mild anemia which is more or less stable likely secondary to chemotherapy.  Continue to monitor.  Counts are otherwise okay to proceed with cycle 3-day 15 of gemcitabine and carboplatin today.  He has been getting chemotherapy every other week as he had chemo induced neutropenia and could not get chemo 2 weeks in a row.  He will be getting repeat CT chest abdomen and pelvis a week from now.  I will see him back in 2 weeks time prior to cycle 4-day 1 of Gemzar and carboplatin and also to discuss the results of his scans  Chemo induced anemia: I will check ferritin and iron studies as well as B12 and folate with the next labs   Visit Diagnosis 1. 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 Encounter for antineoplastic chemotherapy   3. Antineoplastic chemotherapy induced anemia      Dr. Randa Evens, MD, MPH [facility] [phone] 12/03/2017 9:02 AM

## 2017-12-09 ENCOUNTER — Ambulatory Visit
Admission: RE | Admit: 2017-12-09 | Discharge: 2017-12-09 | Disposition: A | Discharge status: Home / Self Care | Payer: Medicare HMO | Source: Ambulatory Visit | Attending: Oncology | Admitting: Oncology

## 2017-12-09 DIAGNOSIS — D49511 Neoplasm of unspecified behavior of right kidney: Secondary | ICD-10-CM | POA: Insufficient documentation

## 2017-12-09 DIAGNOSIS — C669 Malignant neoplasm of unspecified ureter: Secondary | ICD-10-CM

## 2017-12-09 DIAGNOSIS — R918 Other nonspecific abnormal finding of lung field: Secondary | ICD-10-CM | POA: Insufficient documentation

## 2017-12-09 HISTORY — DX: Malignant neoplasm of urinary organ, unspecified: C68.9

## 2017-12-09 HISTORY — DX: Unspecified malignant neoplasm of skin, unspecified: C44.90

## 2017-12-09 MED ORDER — IOPAMIDOL (ISOVUE-300) INJECTION 61%
85.0000 mL | Freq: Once | INTRAVENOUS | Status: AC | PRN
  Administered 2017-12-09: 85 mL via INTRAVENOUS

## 2017-12-10 ENCOUNTER — Other Ambulatory Visit: Payer: Self-pay | Admitting: Oncology

## 2017-12-15 ENCOUNTER — Other Ambulatory Visit: Payer: Self-pay

## 2017-12-15 ENCOUNTER — Inpatient Hospital Stay: Payer: Medicare HMO | Attending: Oncology

## 2017-12-15 ENCOUNTER — Telehealth: Payer: Self-pay | Admitting: Radiology

## 2017-12-15 ENCOUNTER — Encounter: Payer: Self-pay | Admitting: Urology

## 2017-12-15 ENCOUNTER — Ambulatory Visit: Payer: Medicare HMO | Admitting: Urology

## 2017-12-15 ENCOUNTER — Inpatient Hospital Stay: Payer: Medicare HMO

## 2017-12-15 ENCOUNTER — Encounter: Payer: Self-pay | Admitting: Oncology

## 2017-12-15 ENCOUNTER — Inpatient Hospital Stay (HOSPITAL_BASED_OUTPATIENT_CLINIC_OR_DEPARTMENT_OTHER): Payer: Medicare HMO | Admitting: Oncology

## 2017-12-15 VITALS — BP 166/89 | HR 81 | Ht 72.0 in | Wt 195.0 lb

## 2017-12-15 VITALS — BP 161/74 | HR 69 | Temp 96.9°F | Resp 18 | Wt 195.6 lb

## 2017-12-15 DIAGNOSIS — Z8249 Family history of ischemic heart disease and other diseases of the circulatory system: Secondary | ICD-10-CM | POA: Diagnosis not present

## 2017-12-15 DIAGNOSIS — C651 Malignant neoplasm of right renal pelvis: Secondary | ICD-10-CM

## 2017-12-15 DIAGNOSIS — D6481 Anemia due to antineoplastic chemotherapy: Secondary | ICD-10-CM

## 2017-12-15 DIAGNOSIS — I10 Essential (primary) hypertension: Secondary | ICD-10-CM

## 2017-12-15 DIAGNOSIS — Z5111 Encounter for antineoplastic chemotherapy: Secondary | ICD-10-CM

## 2017-12-15 DIAGNOSIS — Z8042 Family history of malignant neoplasm of prostate: Secondary | ICD-10-CM

## 2017-12-15 DIAGNOSIS — C669 Malignant neoplasm of unspecified ureter: Secondary | ICD-10-CM

## 2017-12-15 DIAGNOSIS — C7801 Secondary malignant neoplasm of right lung: Secondary | ICD-10-CM

## 2017-12-15 DIAGNOSIS — Z85828 Personal history of other malignant neoplasm of skin: Secondary | ICD-10-CM | POA: Insufficient documentation

## 2017-12-15 DIAGNOSIS — Z79899 Other long term (current) drug therapy: Secondary | ICD-10-CM

## 2017-12-15 DIAGNOSIS — N1330 Unspecified hydronephrosis: Secondary | ICD-10-CM

## 2017-12-15 DIAGNOSIS — R590 Localized enlarged lymph nodes: Secondary | ICD-10-CM | POA: Diagnosis not present

## 2017-12-15 DIAGNOSIS — C679 Malignant neoplasm of bladder, unspecified: Secondary | ICD-10-CM

## 2017-12-15 DIAGNOSIS — T451X5A Adverse effect of antineoplastic and immunosuppressive drugs, initial encounter: Secondary | ICD-10-CM

## 2017-12-15 LAB — CBC WITH DIFFERENTIAL/PLATELET
Abs Immature Granulocytes: 0.03 10*3/uL (ref 0.00–0.07)
BASOS ABS: 0 10*3/uL (ref 0.0–0.1)
Basophils Relative: 1 %
Eosinophils Absolute: 0.1 10*3/uL (ref 0.0–0.5)
Eosinophils Relative: 1 %
HEMATOCRIT: 31.9 % — AB (ref 39.0–52.0)
HEMOGLOBIN: 10.7 g/dL — AB (ref 13.0–17.0)
IMMATURE GRANULOCYTES: 1 %
LYMPHS ABS: 1.1 10*3/uL (ref 0.7–4.0)
LYMPHS PCT: 23 %
MCH: 30.3 pg (ref 26.0–34.0)
MCHC: 33.5 g/dL (ref 30.0–36.0)
MCV: 90.4 fL (ref 80.0–100.0)
Monocytes Absolute: 0.6 10*3/uL (ref 0.1–1.0)
Monocytes Relative: 13 %
NEUTROS PCT: 61 %
Neutro Abs: 2.9 10*3/uL (ref 1.7–7.7)
Platelets: 215 10*3/uL (ref 150–400)
RBC: 3.53 MIL/uL — AB (ref 4.22–5.81)
RDW: 15.8 % — AB (ref 11.5–15.5)
WBC: 4.7 10*3/uL (ref 4.0–10.5)
nRBC: 0 % (ref 0.0–0.2)

## 2017-12-15 LAB — URINALYSIS, COMPLETE
BILIRUBIN UA: NEGATIVE
GLUCOSE, UA: NEGATIVE
KETONES UA: NEGATIVE
Leukocytes, UA: NEGATIVE
NITRITE UA: NEGATIVE
Protein, UA: NEGATIVE
SPEC GRAV UA: 1.015 (ref 1.005–1.030)
UUROB: 0.2 mg/dL (ref 0.2–1.0)
pH, UA: 7 (ref 5.0–7.5)

## 2017-12-15 LAB — IRON AND TIBC
IRON: 58 ug/dL (ref 45–182)
Saturation Ratios: 17 % — ABNORMAL LOW (ref 17.9–39.5)
TIBC: 340 ug/dL (ref 250–450)
UIBC: 282 ug/dL

## 2017-12-15 LAB — MICROSCOPIC EXAMINATION
Epithelial Cells (non renal): NONE SEEN /hpf (ref 0–10)
WBC UA: NONE SEEN /HPF (ref 0–5)

## 2017-12-15 LAB — BASIC METABOLIC PANEL
ANION GAP: 8 (ref 5–15)
BUN: 18 mg/dL (ref 8–23)
CALCIUM: 9.1 mg/dL (ref 8.9–10.3)
CHLORIDE: 100 mmol/L (ref 98–111)
CO2: 26 mmol/L (ref 22–32)
CREATININE: 1.23 mg/dL (ref 0.61–1.24)
GFR calc non Af Amer: 52 mL/min — ABNORMAL LOW (ref >60)
Glucose, Bld: 159 mg/dL — ABNORMAL HIGH (ref 70–99)
Potassium: 3.7 mmol/L (ref 3.5–5.1)
SODIUM: 134 mmol/L — AB (ref 135–145)

## 2017-12-15 LAB — FERRITIN: Ferritin: 252 ng/mL (ref 24–336)

## 2017-12-15 LAB — FOLATE: FOLATE: 45.6 ng/mL (ref >5.9)

## 2017-12-15 MED ORDER — PALONOSETRON HCL INJECTION 0.25 MG/5ML
0.2500 mg | Freq: Once | INTRAVENOUS | Status: AC
  Administered 2017-12-15: 0.25 mg via INTRAVENOUS
  Filled 2017-12-15: qty 5

## 2017-12-15 MED ORDER — HEPARIN SOD (PORK) LOCK FLUSH 100 UNIT/ML IV SOLN
500.0000 [IU] | Freq: Once | INTRAVENOUS | Status: AC
  Administered 2017-12-15: 500 [IU] via INTRAVENOUS
  Filled 2017-12-15: qty 5

## 2017-12-15 MED ORDER — SODIUM CHLORIDE 0.9% FLUSH
10.0000 mL | Freq: Once | INTRAVENOUS | Status: AC
  Administered 2017-12-15: 10 mL via INTRAVENOUS
  Filled 2017-12-15: qty 10

## 2017-12-15 MED ORDER — SODIUM CHLORIDE 0.9 % IV SOLN
Freq: Once | INTRAVENOUS | Status: AC
  Administered 2017-12-15: 14:00:00 via INTRAVENOUS
  Filled 2017-12-15: qty 250

## 2017-12-15 MED ORDER — SODIUM CHLORIDE 0.9 % IV SOLN
1600.0000 mg | Freq: Once | INTRAVENOUS | Status: AC
  Administered 2017-12-15: 1600 mg via INTRAVENOUS
  Filled 2017-12-15: qty 26.3

## 2017-12-15 MED ORDER — SODIUM CHLORIDE 0.9 % IV SOLN
160.6000 mg | Freq: Once | INTRAVENOUS | Status: AC
  Administered 2017-12-15: 160 mg via INTRAVENOUS
  Filled 2017-12-15: qty 16

## 2017-12-15 MED ORDER — DEXAMETHASONE SODIUM PHOSPHATE 10 MG/ML IJ SOLN
10.0000 mg | Freq: Once | INTRAMUSCULAR | Status: AC
  Administered 2017-12-15: 10 mg via INTRAVENOUS
  Filled 2017-12-15: qty 1

## 2017-12-15 NOTE — Progress Notes (Signed)
Patient here for follow up. No concerns voiced.  °

## 2017-12-15 NOTE — Progress Notes (Signed)
12/15/2017 12:42 PM   Anthony Gonzales [birth year] [id number]  Referring provider: Idelle Crouch, MD [facility] [place], [place] [address]  Chief Complaint  Patient presents with  . Follow-up    3 month stent exchange    HPI: 84 year old male with metastatic upper tract urothelial cancer currently under the care of Dr. Janese Banks receiving palliative carboplatinum/ Gemzar who presents primarily today to discuss management of his right ureteral stent.  He has a personal history of urothelial carcinoma.  More recently, he was taken to the operating room on 08/26/2017 after CT scan revealed a lesion within the right renal pelvis as well as thickening of the UVJ.  He did also have a pathologic 16.5 mm right obturator node.  Biopsy was indeed consistent with high-grade invasive urothelial carcinoma.  Chest imaging revealed a right upper pole lung lesion initially measuring 14.5 by 11 mm concerning for metastatic tumor.   Since starting chemotherapy, he has had some response in his lung lesion now measuring 13.5 x 10.5 mm.  It appears that his primary ureteral lesion has progressed.  He returns today primarily to discuss management of his ureteral stent placed at the time of biopsy.  He denies any gross hematuria or dysuria.  He initially had some flank pain and dysuria associated with a stent but this is since resolved.  Other than insomnia, he has no real complaints today.  He is tolerating chemo relatively well.  PMH: Past Medical History:  Diagnosis Date  . Arthritis   . Benign fibroma of prostate 08/23/2013  . BPH (benign prostatic hyperplasia)   . Calculus of kidney 08/23/2013  . Glaucoma    no drops in 3 mo pressure good, pt denies glaucoma, eye pressure has been measuring alright.  . History of kidney stones   . HLD (hyperlipidemia)   . HOH (hard of hearing)    Left Hearing Aid  . HTN (hypertension) 12/26/2014  . Hypertension   . Hyponatremia  12/26/2014  . Migraines    history of migraines when he was younger.  [name] Restless leg syndrome   . Sinus drainage   . Skin cancer   . Urothelial cancer ([place])    chemo tx's.  [name] UTI (lower urinary tract infection) 12/26/2014  . Vertigo     Surgical History: Past Surgical History:  Procedure Laterality Date  . COLONOSCOPY    . CYSTOSCOPY W/ RETROGRADES Right 01/30/2015   Procedure: CYSTOSCOPY WITH RETROGRADE PYELOGRAM;  Surgeon: [name], MD;  Location: [facility];  Service: Urology;  Laterality: Right;  . CYSTOSCOPY W/ RETROGRADES Bilateral 02/26/2016   Procedure: CYSTOSCOPY WITH RETROGRADE PYELOGRAM;  Surgeon: [name], MD;  Location: [facility];  Service: Urology;  Laterality: Bilateral;  . CYSTOSCOPY W/ URETERAL STENT PLACEMENT Right 08/20/2015   Procedure: CYSTOSCOPY WITH RETROGRADE PYELOGRAM/POSSIBLE URETERAL STENT PLACEMENT/BLADDER BIOPSY;  Surgeon: [name], MD;  Location: [facility];  Service: Urology;  Laterality: Right;  . CYSTOSCOPY W/ URETERAL STENT PLACEMENT Right 09/12/2015   Procedure: CYSTOSCOPY WITH STENT REPLACEMENT;  Surgeon: [name], MD;  Location: [facility];  Service: Urology;  Laterality: Right;  . CYSTOSCOPY WITH BIOPSY Right 09/12/2015   Procedure: CYSTOSCOPY WITH BLADDER AND URETERAL BIOPSY;  Surgeon: [name], MD;  Location: [facility];  Service: Urology;  Laterality: Right;  . CYSTOSCOPY WITH STENT PLACEMENT Right 01/30/2015   Procedure: CYSTOSCOPY WITH STENT PLACEMENT;  Surgeon: [name], MD;  Location: [facility];  Service: Urology;  Laterality: Right;  . CYSTOSCOPY/URETEROSCOPY/HOLMIUM  LASER/STENT PLACEMENT Right 08/19/2017   Procedure: CYSTOSCOPY/URETEROSCOPY/HOLMIUM LASER/STENT PLACEMENT;  Surgeon: [name], MD;  Location: [facility];  Service: Urology;  Laterality: Right;  . EYE SURGERY Bilateral    Cataract Extraction with IOL  . goiter removal    . HOLMIUM LASER APPLICATION N/A 5/46/2703   Procedure:  HOLMIUM LASER  APPLICATION;  Surgeon: [name], MD;  Location: [facility];  Service: Urology;  Laterality: N/A;  . PORTA CATH INSERTION N/A 09/23/2017   Procedure: PORTA CATH INSERTION;  Surgeon: Algernon Huxley, MD;  Location: [facility];  Service: Cardiovascular;  Laterality: N/A;  . SPERMATOCELECTOMY    . TONSILLECTOMY    . TRANSURETHRAL RESECTION OF BLADDER TUMOR N/A 02/26/2016   Procedure: TRANSURETHRAL RESECTION OF BLADDER TUMOR (TURBT);  Surgeon: [name], MD;  Location: [facility];  Service: Urology;  Laterality: N/A;  . TRANSURETHRAL RESECTION OF BLADDER TUMOR WITH MITOMYCIN-C N/A 09/12/2015   Procedure: TRANSURETHRAL RESECTION OF BLADDER TUMOR ;  Surgeon: [name], MD;  Location: [facility];  Service: Urology;  Laterality: N/A;  . TRANSURETHRAL RESECTION OF BLADDER TUMOR WITH MITOMYCIN-C N/A 03/24/2016   Procedure: TRANSURETHRAL RESECTION OF BLADDER TUMOR WITH MITOMYCIN-C  (SMALL);  Surgeon: [name], MD;  Location: [facility];  Service: Urology;  Laterality: N/A;  . URETERAL BIOPSY Right 08/19/2017   Procedure: Renal Mass BIOPSY;  Surgeon: [name], MD;  Location: [facility];  Service: Urology;  Laterality: Right;  . URETEROSCOPY Right 01/30/2015   Procedure: URETEROSCOPY/ WITH BIOPSY AND CYTOLOGY BRUSHING;  Surgeon: [name], MD;  Location: [facility];  Service: Urology;  Laterality: Right;  . URETEROSCOPY Right 08/20/2015   Procedure: URETEROSCOPY;  Surgeon: [name], MD;  Location: [facility];  Service: Urology;  Laterality: Right;  . URETEROSCOPY Right 09/12/2015   Procedure: URETEROSCOPY;  Surgeon: [name], MD;  Location: [facility];  Service: Urology;  Laterality: Right;  . URETEROSCOPY Right 02/26/2016   Procedure: URETEROSCOPY;  Surgeon: [name], MD;  Location: [facility];  Service: Urology;  Laterality: Right;    Home Medications:  Allergies as of 12/15/2017      Reactions   Demerol [meperidine] Nausea And Vomiting   Lipitor [atorvastatin] Swelling    Sulfa Antibiotics Nausea And Vomiting, Rash      Medication List        Accurate as of 12/15/17 12:42 PM. Always use your most recent med list.          acetaminophen 500 MG tablet Commonly known as:  TYLENOL Take 1,000 mg by mouth daily as needed for moderate pain.   benazepril-hydrochlorthiazide 20-12.5 MG tablet Commonly known as:  LOTENSIN HCT Take 1 tablet by mouth daily.   dexamethasone 4 MG tablet Commonly known as:  DECADRON Take 2 tablets (8 mg total) by mouth daily. Start the day after carboplatin chemotherapy for 2 days.   diazepam 2 MG tablet Commonly known as:  VALIUM Take 2 mg by mouth every 12 (twelve) hours as needed for anxiety.   docusate sodium 100 MG capsule Commonly known as:  COLACE Take 100 mg by mouth daily as needed for mild constipation.   fenofibrate micronized 134 MG capsule Commonly known as:  LOFIBRA Take 134 mg by mouth daily before breakfast.   fluticasone 50 MCG/ACT nasal spray Commonly known as:  FLONASE Place 2 sprays into both nostrils at bedtime.   hydrocortisone cream 1 % Apply 1 application topically daily as needed for itching.   ICAPS AREDS 2 Caps Take 1 capsule by  mouth 2 (two) times daily.   lidocaine-prilocaine cream Commonly known as:  EMLA Apply to affected area once   LORazepam 0.5 MG tablet Commonly known as:  ATIVAN Take 1 tablet (0.5 mg total) by mouth every 6 (six) hours as needed (Nausea or vomiting).   multivitamin with minerals Tabs tablet Take 1 tablet by mouth daily.   ondansetron 8 MG tablet Commonly known as:  ZOFRAN Take 1 tablet (8 mg total) by mouth 2 (two) times daily as needed for refractory nausea / vomiting. Start on day 3 after carboplatin chemo.   Potassium 99 MG Tabs Take 99 mg by mouth at bedtime.   prochlorperazine 10 MG tablet Commonly known as:  COMPAZINE Take 1 tablet (10 mg total) by mouth every 6 (six) hours as needed (Nausea or vomiting).   psyllium 58.6 % packet Commonly  known as:  METAMUCIL Take 1 packet by mouth daily as needed (constipation).   tamsulosin 0.4 MG Caps capsule Commonly known as:  FLOMAX Take 1 capsule (0.4 mg total) by mouth daily.       Allergies:  Allergies  Allergen Reactions  . Demerol [Meperidine] Nausea And Vomiting  . Lipitor [Atorvastatin] Swelling  . Sulfa Antibiotics Nausea And Vomiting and Rash    Family History: Family History  Problem Relation Age of Onset  . Hypertension Mother   . Hypertension Father   . Prostate cancer Brother   . Kidney disease Neg Hx   . Kidney cancer Neg Hx   . Bladder Cancer Neg Hx     Social History:  reports that he has never smoked. He has never used smokeless tobacco. He reports that he does not drink alcohol or use drugs.  ROS: UROLOGY Frequent Urination?: No Hard to postpone urination?: No Burning/pain with urination?: No Get up at night to urinate?: No Leakage of urine?: No Urine stream starts and stops?: No Trouble starting stream?: No Do you have to strain to urinate?: No Blood in urine?: No Urinary tract infection?: No Sexually transmitted disease?: No Injury to kidneys or bladder?: No Painful intercourse?: No Weak stream?: No Erection problems?: No Penile pain?: No  Gastrointestinal Nausea?: No Vomiting?: No Indigestion/heartburn?: No Diarrhea?: Yes Constipation?: No  Constitutional Fever: No Night sweats?: No Weight loss?: No Fatigue?: No  Skin Skin rash/lesions?: No Itching?: No  Eyes Blurred vision?: No Double vision?: No  Ears/Nose/Throat Sore throat?: No Sinus problems?: No  Hematologic/Lymphatic Swollen glands?: No Easy bruising?: No  Cardiovascular Leg swelling?: No Chest pain?: No  Respiratory Cough?: No Shortness of breath?: No  Endocrine Excessive thirst?: No  Musculoskeletal Back pain?: No Joint pain?: No  Neurological Headaches?: No Dizziness?: No  Psychologic Depression?: No Anxiety?: No  Physical  Exam: BP (!) 166/89   Pulse 81   Ht 6' (1.829 m)   Wt 195 lb (88.5 kg)   BMI 26.45 kg/m   Constitutional:  Alert and oriented, No acute distress.  Accompanied by wife today. HEENT: [place] AT, moist mucus membranes.  Trachea midline, no masses. Cardiovascular: No clubbing, cyanosis, or edema. Respiratory: Normal respiratory effort, no increased work of breathing. Skin: No rashes, bruises or suspicious lesions. Neurologic: Grossly intact, no focal deficits, moving all 4 extremities. Psychiatric: Normal mood and affect.  Laboratory Data: Lab Results  Component Value Date   WBC 3.8 12/01/2017   HGB 10.8 (L) 12/01/2017   HCT 31.4 (L) 12/01/2017   MCV 91.4 12/01/2017   PLT 233 12/01/2017    Lab Results  Component Value Date  CREATININE 1.27 (H) 12/01/2017   Urinalysis Results for orders placed or performed in visit on 12/15/17  Microscopic Examination  Result Value Ref Range   WBC, UA None seen 0 - 5 /hpf   RBC, UA 0-2 0 - 2 /hpf   Epithelial Cells (non renal) None seen 0 - 10 /hpf   Casts Present (A) None seen /lpf   Cast Type Hyaline casts N/A   Mucus, UA Present (A) Not Estab.   Bacteria, UA Few (A) None seen/Few  Urinalysis, Complete  Result Value Ref Range   Specific Gravity, UA 1.015 1.005 - 1.030   pH, UA 7.0 5.0 - 7.5   Color, UA Yellow Yellow   Appearance Ur Clear Clear   Leukocytes, UA Negative Negative   Protein, UA Negative Negative/Trace   Glucose, UA Negative Negative   Ketones, UA Negative Negative   RBC, UA Trace (A) Negative   Bilirubin, UA Negative Negative   Urobilinogen, Ur 0.2 0.2 - 1.0 mg/dL   Nitrite, UA Negative Negative   Microscopic Examination See below:     Pertinent Imaging: CLINICAL DATA:  Restaging metastatic uroepithelial carcinoma. 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  CONTRAST:  16mL ISOVUE-300 IOPAMIDOL (ISOVUE-300) INJECTION 61%  COMPARISON:  PET-CT 09/21/2017 and chest CT 09/04/2017  FINDINGS: CT CHEST FINDINGS  Cardiovascular: The heart is normal in size. No pericardial effusion. Stable mild scattered atherosclerotic calcifications involving the thoracic aorta but no dissection or aneurysm. The branch vessels are patent. Scattered coronary artery calcifications.  Mediastinum/Nodes: No mediastinal or hilar mass or lymphadenopathy. Small scattered lymph nodes are stable. The esophagus is grossly normal.  Lungs/Pleura: Smoothly marginated and slightly lobular right upper lobe lung lesion measures 13.5 x 10.5 mm on image number 75 previously measured 14.5 x 11 mm.  2.5 mm left lower lobe nodule on image number 84 is unchanged since the CT scan of 09/04/2017.  Stable 3 mm nodule in the left upper lobe on image number 64.  No other pulmonary lesions are identified. No acute pulmonary findings.  Musculoskeletal: No significant bony findings. No worrisome bone lesions.  CT ABDOMEN PELVIS FINDINGS  Hepatobiliary: No focal hepatic lesions to suggest metastatic disease. Gallstones are noted the gallbladder. No common bile duct dilatation.  Pancreas: No mass, inflammation or ductal dilatation. There is a small stable cyst noted in the lower pancreatic head no change when compared to the prior CT scan from 08/06/2017. It measures 11 x 7 mm.  Spleen: Normal size.  No focal lesions.  Adrenals/Urinary Tract: The adrenal glands are unremarkable and stable.  Stable of right renal cyst. There is a double-J ureteral stent noted on the right side. There is high attenuation material in the right renal pelvis. This appears to be enhancing and is worrisome for transitional cell neoplasm. It appears of progressive when compared to the prior CT  abdomen from 08/06/2017.  The left kidney, left ureter and bladder are unremarkable.  Stomach/Bowel: The stomach, duodenum, small bowel and colon are grossly normal. Sigmoid diverticulosis without findings for acute diverticulitis. No inflammatory changes, mass lesions or obstructive findings. The terminal ileum and appendix are normal.  Vascular/Lymphatic: Stable atherosclerotic calcifications involving the distal aorta and iliac arteries. No aneurysm or dissection. The major venous structures are patent.  No mesenteric or retroperitoneal mass or  lymphadenopathy. Small scattered lymph nodes are stable.  11.5 mm right obturator lymph node on image number 116 previously measured 16.5 mm. No new pelvic lymph nodes. No inguinal adenopathy.  Reproductive: The prostate gland and seminal vesicles are unremarkable.  Other: No inguinal mass or hernia.  Musculoskeletal: No significant bony findings. No lytic or destructive bone lesions.  IMPRESSION: 1. The right upper lobe pulmonary nodule is slightly smaller. Other pulmonary nodules are stable. No new/progressive findings in the chest. 2. Right-sided double-J ureteral stent. No obvious distal right ureteral tumor. However, there is progressive appearing tumor in the right renal pelvis. Retrograde study may be helpful for further evaluation. 3. Interval decrease in size of the right obturator lymph node. No new/progressive adenopathy.   Electronically Signed   By: Marijo Sanes M.D.   On: 12/09/2017 16:26  CT chest abdomen pelvis was personally reviewed today.  Agree with radiologic interpretation.  Assessment & Plan:    1. Malignant neoplasm of right renal pelvis [facility]) Metastatic disease with a local regional lymph node involvement and lung Currently being treated by Dr. Janese Banks with palliative chemotherapy, well-tolerated Most recent CT imaging personally reviewed, some response to lymph node and lung, primary tumor  possibly progressing - Urinalysis, Complete - CULTURE, URINE COMPREHENSIVE  2. Hydronephrosis of right kidney Managed with indwelling ureteral stent Due for ureteral stent exchange Depending on the amount of encrustation, we can likely leave this in for longer periods of time in the future, up to 6 months with this particular type of stent We discussed the risk and benefits today in detail including risk of bleeding, infection, damage surrounding structures, amongst others.  All questions were answered.  Case was discussed with Dr. Janese Banks along with the timing of stent exchange given his immunocompromise state with chemotherapy, will arrange for procedure to be done just prior to next chemo administration.  Preop UA/urine culture  [name], MD  [facility] [address], [place], [place] [address] [phone]  I spent 25 min with this patient of which greater than 50% was spent in counseling and coordination of care with the patient.

## 2017-12-15 NOTE — Telephone Encounter (Signed)
Dr Janese Banks, Dr Erlene Quan has asked me to schedule Anthony Gonzales's ureteral stent exchange on 12/28/2017. Will this date interfere with his chemo schedule?

## 2017-12-15 NOTE — Telephone Encounter (Signed)
10/21 is fine for stent exchange

## 2017-12-15 NOTE — H&P (View-Only) (Signed)
12/15/2017 12:42 PM   [name] 11-26-1931 [id number]  Referring provider: Idelle Crouch, MD [facility] [place], [place] [address]  Chief Complaint  Patient presents with  . Follow-up    3 month stent exchange    HPI: 84 year old male with metastatic upper tract urothelial cancer currently under the care of Dr. Janese Banks receiving palliative carboplatinum/ Gemzar who presents primarily today to discuss management of his right ureteral stent.  He has a personal history of urothelial carcinoma.  More recently, he was taken to the operating room on 08/26/2017 after CT scan revealed a lesion within the right renal pelvis as well as thickening of the UVJ.  He did also have a pathologic 16.5 mm right obturator node.  Biopsy was indeed consistent with high-grade invasive urothelial carcinoma.  Chest imaging revealed a right upper pole lung lesion initially measuring 14.5 by 11 mm concerning for metastatic tumor.   Since starting chemotherapy, he has had some response in his lung lesion now measuring 13.5 x 10.5 mm.  It appears that his primary ureteral lesion has progressed.  He returns today primarily to discuss management of his ureteral stent placed at the time of biopsy.  He denies any gross hematuria or dysuria.  He initially had some flank pain and dysuria associated with a stent but this is since resolved.  Other than insomnia, he has no real complaints today.  He is tolerating chemo relatively well.  PMH: Past Medical History:  Diagnosis Date  . Arthritis   . Benign fibroma of prostate 08/23/2013  . BPH (benign prostatic hyperplasia)   . Calculus of kidney 08/23/2013  . Glaucoma    no drops in 3 mo pressure good, pt denies glaucoma, eye pressure has been measuring alright.  . History of kidney stones   . HLD (hyperlipidemia)   . HOH (hard of hearing)    Left Hearing Aid  . HTN (hypertension) 12/26/2014  . Hypertension   . Hyponatremia  12/26/2014  . Migraines    history of migraines when he was younger.  [name] Restless leg syndrome   . Sinus drainage   . Skin cancer   . Urothelial cancer ([place])    chemo tx's.  [name] UTI (lower urinary tract infection) 12/26/2014  . Vertigo     Surgical History: Past Surgical History:  Procedure Laterality Date  . COLONOSCOPY    . CYSTOSCOPY W/ RETROGRADES Right 01/30/2015   Procedure: CYSTOSCOPY WITH RETROGRADE PYELOGRAM;  Surgeon: [name], MD;  Location: [facility];  Service: Urology;  Laterality: Right;  . CYSTOSCOPY W/ RETROGRADES Bilateral 02/26/2016   Procedure: CYSTOSCOPY WITH RETROGRADE PYELOGRAM;  Surgeon: [name], MD;  Location: [facility];  Service: Urology;  Laterality: Bilateral;  . CYSTOSCOPY W/ URETERAL STENT PLACEMENT Right 08/20/2015   Procedure: CYSTOSCOPY WITH RETROGRADE PYELOGRAM/POSSIBLE URETERAL STENT PLACEMENT/BLADDER BIOPSY;  Surgeon: [name], MD;  Location: [facility];  Service: Urology;  Laterality: Right;  . CYSTOSCOPY W/ URETERAL STENT PLACEMENT Right 09/12/2015   Procedure: CYSTOSCOPY WITH STENT REPLACEMENT;  Surgeon: [name], MD;  Location: [facility];  Service: Urology;  Laterality: Right;  . CYSTOSCOPY WITH BIOPSY Right 09/12/2015   Procedure: CYSTOSCOPY WITH BLADDER AND URETERAL BIOPSY;  Surgeon: [name], MD;  Location: [facility];  Service: Urology;  Laterality: Right;  . CYSTOSCOPY WITH STENT PLACEMENT Right 01/30/2015   Procedure: CYSTOSCOPY WITH STENT PLACEMENT;  Surgeon: [name], MD;  Location: [facility];  Service: Urology;  Laterality: Right;  . CYSTOSCOPY/URETEROSCOPY/HOLMIUM  LASER/STENT PLACEMENT Right 08/19/2017   Procedure: CYSTOSCOPY/URETEROSCOPY/HOLMIUM LASER/STENT PLACEMENT;  Surgeon: [name], MD;  Location: [facility];  Service: Urology;  Laterality: Right;  . EYE SURGERY Bilateral    Cataract Extraction with IOL  . goiter removal    . HOLMIUM LASER APPLICATION N/A 2/42/3536   Procedure:  HOLMIUM LASER  APPLICATION;  Surgeon: [name], MD;  Location: [facility];  Service: Urology;  Laterality: N/A;  . PORTA CATH INSERTION N/A 09/23/2017   Procedure: PORTA CATH INSERTION;  Surgeon: Algernon Huxley, MD;  Location: [facility];  Service: Cardiovascular;  Laterality: N/A;  . SPERMATOCELECTOMY    . TONSILLECTOMY    . TRANSURETHRAL RESECTION OF BLADDER TUMOR N/A 02/26/2016   Procedure: TRANSURETHRAL RESECTION OF BLADDER TUMOR (TURBT);  Surgeon: [name], MD;  Location: [facility];  Service: Urology;  Laterality: N/A;  . TRANSURETHRAL RESECTION OF BLADDER TUMOR WITH MITOMYCIN-C N/A 09/12/2015   Procedure: TRANSURETHRAL RESECTION OF BLADDER TUMOR ;  Surgeon: [name], MD;  Location: [facility];  Service: Urology;  Laterality: N/A;  . TRANSURETHRAL RESECTION OF BLADDER TUMOR WITH MITOMYCIN-C N/A 03/24/2016   Procedure: TRANSURETHRAL RESECTION OF BLADDER TUMOR WITH MITOMYCIN-C  (SMALL);  Surgeon: [name], MD;  Location: [facility];  Service: Urology;  Laterality: N/A;  . URETERAL BIOPSY Right 08/19/2017   Procedure: Renal Mass BIOPSY;  Surgeon: [name], MD;  Location: [facility];  Service: Urology;  Laterality: Right;  . URETEROSCOPY Right 01/30/2015   Procedure: URETEROSCOPY/ WITH BIOPSY AND CYTOLOGY BRUSHING;  Surgeon: [name], MD;  Location: [facility];  Service: Urology;  Laterality: Right;  . URETEROSCOPY Right 08/20/2015   Procedure: URETEROSCOPY;  Surgeon: [name], MD;  Location: [facility];  Service: Urology;  Laterality: Right;  . URETEROSCOPY Right 09/12/2015   Procedure: URETEROSCOPY;  Surgeon: [name], MD;  Location: [facility];  Service: Urology;  Laterality: Right;  . URETEROSCOPY Right 02/26/2016   Procedure: URETEROSCOPY;  Surgeon: [name], MD;  Location: [facility];  Service: Urology;  Laterality: Right;    Home Medications:  Allergies as of 12/15/2017      Reactions   Demerol [meperidine] Nausea And Vomiting   Lipitor [atorvastatin] Swelling    Sulfa Antibiotics Nausea And Vomiting, Rash      Medication List        Accurate as of 12/15/17 12:42 PM. Always use your most recent med list.          acetaminophen 500 MG tablet Commonly known as:  TYLENOL Take 1,000 mg by mouth daily as needed for moderate pain.   benazepril-hydrochlorthiazide 20-12.5 MG tablet Commonly known as:  LOTENSIN HCT Take 1 tablet by mouth daily.   dexamethasone 4 MG tablet Commonly known as:  DECADRON Take 2 tablets (8 mg total) by mouth daily. Start the day after carboplatin chemotherapy for 2 days.   diazepam 2 MG tablet Commonly known as:  VALIUM Take 2 mg by mouth every 12 (twelve) hours as needed for anxiety.   docusate sodium 100 MG capsule Commonly known as:  COLACE Take 100 mg by mouth daily as needed for mild constipation.   fenofibrate micronized 134 MG capsule Commonly known as:  LOFIBRA Take 134 mg by mouth daily before breakfast.   fluticasone 50 MCG/ACT nasal spray Commonly known as:  FLONASE Place 2 sprays into both nostrils at bedtime.   hydrocortisone cream 1 % Apply 1 application topically daily as needed for itching.   ICAPS AREDS 2 Caps Take 1 capsule by  mouth 2 (two) times daily.   lidocaine-prilocaine cream Commonly known as:  EMLA Apply to affected area once   LORazepam 0.5 MG tablet Commonly known as:  ATIVAN Take 1 tablet (0.5 mg total) by mouth every 6 (six) hours as needed (Nausea or vomiting).   multivitamin with minerals Tabs tablet Take 1 tablet by mouth daily.   ondansetron 8 MG tablet Commonly known as:  ZOFRAN Take 1 tablet (8 mg total) by mouth 2 (two) times daily as needed for refractory nausea / vomiting. Start on day 3 after carboplatin chemo.   Potassium 99 MG Tabs Take 99 mg by mouth at bedtime.   prochlorperazine 10 MG tablet Commonly known as:  COMPAZINE Take 1 tablet (10 mg total) by mouth every 6 (six) hours as needed (Nausea or vomiting).   psyllium 58.6 % packet Commonly  known as:  METAMUCIL Take 1 packet by mouth daily as needed (constipation).   tamsulosin 0.4 MG Caps capsule Commonly known as:  FLOMAX Take 1 capsule (0.4 mg total) by mouth daily.       Allergies:  Allergies  Allergen Reactions  . Demerol [Meperidine] Nausea And Vomiting  . Lipitor [Atorvastatin] Swelling  . Sulfa Antibiotics Nausea And Vomiting and Rash    Family History: Family History  Problem Relation Age of Onset  . Hypertension Mother   . Hypertension Father   . Prostate cancer Brother   . Kidney disease Neg Hx   . Kidney cancer Neg Hx   . Bladder Cancer Neg Hx     Social History:  reports that he has never smoked. He has never used smokeless tobacco. He reports that he does not drink alcohol or use drugs.  ROS: UROLOGY Frequent Urination?: No Hard to postpone urination?: No Burning/pain with urination?: No Get up at night to urinate?: No Leakage of urine?: No Urine stream starts and stops?: No Trouble starting stream?: No Do you have to strain to urinate?: No Blood in urine?: No Urinary tract infection?: No Sexually transmitted disease?: No Injury to kidneys or bladder?: No Painful intercourse?: No Weak stream?: No Erection problems?: No Penile pain?: No  Gastrointestinal Nausea?: No Vomiting?: No Indigestion/heartburn?: No Diarrhea?: Yes Constipation?: No  Constitutional Fever: No Night sweats?: No Weight loss?: No Fatigue?: No  Skin Skin rash/lesions?: No Itching?: No  Eyes Blurred vision?: No Double vision?: No  Ears/Nose/Throat Sore throat?: No Sinus problems?: No  Hematologic/Lymphatic Swollen glands?: No Easy bruising?: No  Cardiovascular Leg swelling?: No Chest pain?: No  Respiratory Cough?: No Shortness of breath?: No  Endocrine Excessive thirst?: No  Musculoskeletal Back pain?: No Joint pain?: No  Neurological Headaches?: No Dizziness?: No  Psychologic Depression?: No Anxiety?: No  Physical  Exam: BP (!) 166/89   Pulse 81   Ht 6' (1.829 m)   Wt 195 lb (88.5 kg)   BMI 26.45 kg/m   Constitutional:  Alert and oriented, No acute distress.  Accompanied by wife today. HEENT: [place] AT, moist mucus membranes.  Trachea midline, no masses. Cardiovascular: No clubbing, cyanosis, or edema. Respiratory: Normal respiratory effort, no increased work of breathing. Skin: No rashes, bruises or suspicious lesions. Neurologic: Grossly intact, no focal deficits, moving all 4 extremities. Psychiatric: Normal mood and affect.  Laboratory Data: Lab Results  Component Value Date   WBC 3.8 12/01/2017   HGB 10.8 (L) 12/01/2017   HCT 31.4 (L) 12/01/2017   MCV 91.4 12/01/2017   PLT 233 12/01/2017    Lab Results  Component Value Date  CREATININE 1.27 (H) 12/01/2017   Urinalysis Results for orders placed or performed in visit on 12/15/17  Microscopic Examination  Result Value Ref Range   WBC, UA None seen 0 - 5 /hpf   RBC, UA 0-2 0 - 2 /hpf   Epithelial Cells (non renal) None seen 0 - 10 /hpf   Casts Present (A) None seen /lpf   Cast Type Hyaline casts N/A   Mucus, UA Present (A) Not Estab.   Bacteria, UA Few (A) None seen/Few  Urinalysis, Complete  Result Value Ref Range   Specific Gravity, UA 1.015 1.005 - 1.030   pH, UA 7.0 5.0 - 7.5   Color, UA Yellow Yellow   Appearance Ur Clear Clear   Leukocytes, UA Negative Negative   Protein, UA Negative Negative/Trace   Glucose, UA Negative Negative   Ketones, UA Negative Negative   RBC, UA Trace (A) Negative   Bilirubin, UA Negative Negative   Urobilinogen, Ur 0.2 0.2 - 1.0 mg/dL   Nitrite, UA Negative Negative   Microscopic Examination See below:     Pertinent Imaging: CLINICAL DATA:  Restaging metastatic uroepithelial carcinoma. 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  CONTRAST:  3mL ISOVUE-300 IOPAMIDOL (ISOVUE-300) INJECTION 61%  COMPARISON:  PET-CT 09/21/2017 and chest CT 09/04/2017  FINDINGS: CT CHEST FINDINGS  Cardiovascular: The heart is normal in size. No pericardial effusion. Stable mild scattered atherosclerotic calcifications involving the thoracic aorta but no dissection or aneurysm. The branch vessels are patent. Scattered coronary artery calcifications.  Mediastinum/Nodes: No mediastinal or hilar mass or lymphadenopathy. Small scattered lymph nodes are stable. The esophagus is grossly normal.  Lungs/Pleura: Smoothly marginated and slightly lobular right upper lobe lung lesion measures 13.5 x 10.5 mm on image number 75 previously measured 14.5 x 11 mm.  2.5 mm left lower lobe nodule on image number 84 is unchanged since the CT scan of 09/04/2017.  Stable 3 mm nodule in the left upper lobe on image number 64.  No other pulmonary lesions are identified. No acute pulmonary findings.  Musculoskeletal: No significant bony findings. No worrisome bone lesions.  CT ABDOMEN PELVIS FINDINGS  Hepatobiliary: No focal hepatic lesions to suggest metastatic disease. Gallstones are noted the gallbladder. No common bile duct dilatation.  Pancreas: No mass, inflammation or ductal dilatation. There is a small stable cyst noted in the lower pancreatic head no change when compared to the prior CT scan from 08/06/2017. It measures 11 x 7 mm.  Spleen: Normal size.  No focal lesions.  Adrenals/Urinary Tract: The adrenal glands are unremarkable and stable.  Stable of right renal cyst. There is a double-J ureteral stent noted on the right side. There is high attenuation material in the right renal pelvis. This appears to be enhancing and is worrisome for transitional cell neoplasm. It appears of progressive when compared to the prior CT  abdomen from 08/06/2017.  The left kidney, left ureter and bladder are unremarkable.  Stomach/Bowel: The stomach, duodenum, small bowel and colon are grossly normal. Sigmoid diverticulosis without findings for acute diverticulitis. No inflammatory changes, mass lesions or obstructive findings. The terminal ileum and appendix are normal.  Vascular/Lymphatic: Stable atherosclerotic calcifications involving the distal aorta and iliac arteries. No aneurysm or dissection. The major venous structures are patent.  No mesenteric or retroperitoneal mass or  lymphadenopathy. Small scattered lymph nodes are stable.  11.5 mm right obturator lymph node on image number 116 previously measured 16.5 mm. No new pelvic lymph nodes. No inguinal adenopathy.  Reproductive: The prostate gland and seminal vesicles are unremarkable.  Other: No inguinal mass or hernia.  Musculoskeletal: No significant bony findings. No lytic or destructive bone lesions.  IMPRESSION: 1. The right upper lobe pulmonary nodule is slightly smaller. Other pulmonary nodules are stable. No new/progressive findings in the chest. 2. Right-sided double-J ureteral stent. No obvious distal right ureteral tumor. However, there is progressive appearing tumor in the right renal pelvis. Retrograde study may be helpful for further evaluation. 3. Interval decrease in size of the right obturator lymph node. No new/progressive adenopathy.   Electronically Signed   By: Marijo Sanes M.D.   On: 12/09/2017 16:26  CT chest abdomen pelvis was personally reviewed today.  Agree with radiologic interpretation.  Assessment & Plan:    1. Malignant neoplasm of right renal pelvis [facility]) Metastatic disease with a local regional lymph node involvement and lung Currently being treated by Dr. Janese Banks with palliative chemotherapy, well-tolerated Most recent CT imaging personally reviewed, some response to lymph node and lung, primary tumor  possibly progressing - Urinalysis, Complete - CULTURE, URINE COMPREHENSIVE  2. Hydronephrosis of right kidney Managed with indwelling ureteral stent Due for ureteral stent exchange Depending on the amount of encrustation, we can likely leave this in for longer periods of time in the future, up to 6 months with this particular type of stent We discussed the risk and benefits today in detail including risk of bleeding, infection, damage surrounding structures, amongst others.  All questions were answered.  Case was discussed with Dr. Janese Banks along with the timing of stent exchange given his immunocompromise state with chemotherapy, will arrange for procedure to be done just prior to next chemo administration.  Preop UA/urine culture  [name], MD  [facility] [address] [place], [place] [address] [phone]  I spent 25 min with this patient of which greater than 50% was spent in counseling and coordination of care with the patient.

## 2017-12-16 ENCOUNTER — Other Ambulatory Visit: Payer: Self-pay | Admitting: Radiology

## 2017-12-16 DIAGNOSIS — C651 Malignant neoplasm of right renal pelvis: Secondary | ICD-10-CM

## 2017-12-16 LAB — VITAMIN B12: Vitamin B-12: 497 pg/mL (ref 180–914)

## 2017-12-17 ENCOUNTER — Inpatient Hospital Stay: Payer: Medicare HMO

## 2017-12-18 LAB — CULTURE, URINE COMPREHENSIVE

## 2017-12-18 NOTE — Progress Notes (Signed)
Hematology/Oncology Consult note [facility]  Telephone:([phone] [phone]  Patient Care Team: Idelle Crouch, MD as PCP - General (Internal Medicine)   Name of the patient: Anthony Gonzales  [id number]  1932-01-08   Date of visit: 12/18/17  Diagnosis- Metastatic urothelial carcinoma with possible mets to the obturator node. Indeterminate hilar lymph nodes and LUL lung nodule   Chief complaint/ Reason for visit-on treatment assessment prior to cycle 4-day 1 of carboplatin and Gemzar.  Discussed results of CT scan  Heme/Onc history: patient is a 84 year old male with past medical history significant for hypertension and long-standing history of superficial bladder cancer for which he sees [name]. He has undergone TURBT as well as ureteroscopy since 2017 along with mitomycin as well in the past. Most recently he underwent CT abdomen on 08/06/2017 which showed a soft tissue mass in the right renal pelvis concerning for upper tract urothelial neoplasm. Soft tissue fullness at the right ureterovesical junction with proximal right hydroureteronephrosis. Several millimeter attenuation lesion in the pancreatic head.  He underwent diagnostic ureteroscopy and was found to have 2 high-grade lesions within his right kidney. There was a nodular high-grade appearing lesion in the right anterior renal pelvis. There was also a second ureteral tumor fungating from the right ureteral orifice extending into the distal ureter also consistent with high-grade invasive urothelial carcinoma. Muscle invasion could not be assessed. He also underwent a CT chest which showed a rounded nodule in the right upper lobe measuring 14 mm concerning for metastases. 2 other 3 mm lesions were also noted in the left upper lobe likely benign  Plan initially was neoadjuvant chemotherapy followed by possible surgery but given the presence of lung lesion patient has been  referred to oncology for the same.  PET/CT on 09/21/17 showed:IMPRESSION: 1. Hypermetabolic right obturator lymph node is most indicative of metastatic disease. 2. Mildly hypermetabolic left hilar lymph nodes are nonspecific. Continued attention on follow-up exams is warranted. 3. Right upper lobe pulmonary nodule shows metabolism after just above blood pool and is therefore indeterminate. Continued attention on follow-up exams is warranted. 4. Aortic atherosclerosis (ICD10-170.0). Coronary artery calcification. 5. Cholelithiasis   Plan is to proceed with 4-6 cycles of carboplatin/ gemzar1 week on and one-week off as patient could not tolerate 2-week on and one week off regimen. Cycle 1 started on 09/22/2017   Interval history-patient is tolerating chemotherapy well without any significant side effects.  He has mild fatigue is self-limited.  Has occasional problems sleeping at night especially because he has to go to the bathroom 2-3 times at night.  ECOG PS- 1 Pain scale- 0 Opioid associated constipation- no  Review of systems- Review of Systems  Constitutional: Positive for malaise/fatigue. Negative for chills, fever and weight loss.  HENT: Negative for congestion, ear discharge and nosebleeds.   Eyes: Negative for blurred vision.  Respiratory: Negative for cough, hemoptysis, sputum production, shortness of breath and wheezing.   Cardiovascular: Negative for chest pain, palpitations, orthopnea and claudication.  Gastrointestinal: Negative for abdominal pain, blood in stool, constipation, diarrhea, heartburn, melena, nausea and vomiting.  Genitourinary: Negative for dysuria, flank pain, frequency, hematuria and urgency.  Musculoskeletal: Negative for back pain, joint pain and myalgias.  Skin: Negative for rash.  Neurological: Negative for dizziness, tingling, focal weakness, seizures, weakness and headaches.  Endo/Heme/Allergies: Does not bruise/bleed easily.    Psychiatric/Behavioral: Negative for depression and suicidal ideas. The patient does not have insomnia.       Allergies  Allergen Reactions  . Demerol [Meperidine] Nausea And Vomiting  . Lipitor [Atorvastatin] Swelling  . Sulfa Antibiotics Nausea And Vomiting and Rash     Past Medical History:  Diagnosis Date  . Arthritis   . Benign fibroma of prostate 08/23/2013  . BPH (benign prostatic hyperplasia)   . Calculus of kidney 08/23/2013  . Glaucoma    no drops in 3 mo pressure good, pt denies glaucoma, eye pressure has been measuring alright.  . History of kidney stones   . HLD (hyperlipidemia)   . HOH (hard of hearing)    Left Hearing Aid  . HTN (hypertension) 12/26/2014  . Hypertension   . Hyponatremia 12/26/2014  . Migraines    history of migraines when he was younger.  [name] Restless leg syndrome   . Sinus drainage   . Skin cancer   . Urothelial cancer ([place])    chemo tx's.  [name] UTI (lower urinary tract infection) 12/26/2014  . Vertigo      Past Surgical History:  Procedure Laterality Date  . COLONOSCOPY    . CYSTOSCOPY W/ RETROGRADES Right 01/30/2015   Procedure: CYSTOSCOPY WITH RETROGRADE PYELOGRAM;  Surgeon: [name], MD;  Location: [facility];  Service: Urology;  Laterality: Right;  . CYSTOSCOPY W/ RETROGRADES Bilateral 02/26/2016   Procedure: CYSTOSCOPY WITH RETROGRADE PYELOGRAM;  Surgeon: [name], MD;  Location: [facility];  Service: Urology;  Laterality: Bilateral;  . CYSTOSCOPY W/ URETERAL STENT PLACEMENT Right 08/20/2015   Procedure: CYSTOSCOPY WITH RETROGRADE PYELOGRAM/POSSIBLE URETERAL STENT PLACEMENT/BLADDER BIOPSY;  Surgeon: [name], MD;  Location: [facility];  Service: Urology;  Laterality: Right;  . CYSTOSCOPY W/ URETERAL STENT PLACEMENT Right 09/12/2015   Procedure: CYSTOSCOPY WITH STENT REPLACEMENT;  Surgeon: [name], MD;  Location: [facility];  Service: Urology;  Laterality: Right;  . CYSTOSCOPY WITH BIOPSY Right 09/12/2015   Procedure:  CYSTOSCOPY WITH BLADDER AND URETERAL BIOPSY;  Surgeon: [name], MD;  Location: [facility];  Service: Urology;  Laterality: Right;  . CYSTOSCOPY WITH STENT PLACEMENT Right 01/30/2015   Procedure: CYSTOSCOPY WITH STENT PLACEMENT;  Surgeon: [name], MD;  Location: [facility];  Service: Urology;  Laterality: Right;  . CYSTOSCOPY/URETEROSCOPY/HOLMIUM LASER/STENT PLACEMENT Right 08/19/2017   Procedure: CYSTOSCOPY/URETEROSCOPY/HOLMIUM LASER/STENT PLACEMENT;  Surgeon: [name], MD;  Location: [facility];  Service: Urology;  Laterality: Right;  . EYE SURGERY Bilateral    Cataract Extraction with IOL  . goiter removal    . HOLMIUM LASER APPLICATION N/A [id number]   Procedure:  HOLMIUM LASER APPLICATION;  Surgeon: [name], MD;  Location: [facility];  Service: Urology;  Laterality: N/A;  . PORTA CATH INSERTION N/A 09/23/2017   Procedure: PORTA CATH INSERTION;  Surgeon: Algernon Huxley, MD;  Location: [facility];  Service: Cardiovascular;  Laterality: N/A;  . SPERMATOCELECTOMY    . TONSILLECTOMY    . TRANSURETHRAL RESECTION OF BLADDER TUMOR N/A 02/26/2016   Procedure: TRANSURETHRAL RESECTION OF BLADDER TUMOR (TURBT);  Surgeon: [name], MD;  Location: [facility];  Service: Urology;  Laterality: N/A;  . TRANSURETHRAL RESECTION OF BLADDER TUMOR WITH MITOMYCIN-C N/A 09/12/2015   Procedure: TRANSURETHRAL RESECTION OF BLADDER TUMOR ;  Surgeon: [name], MD;  Location: [facility];  Service: Urology;  Laterality: N/A;  . TRANSURETHRAL RESECTION OF BLADDER TUMOR WITH MITOMYCIN-C N/A 03/24/2016   Procedure: TRANSURETHRAL RESECTION OF BLADDER TUMOR WITH MITOMYCIN-C  (SMALL);  Surgeon: [name], MD;  Location: [facility];  Service: Urology;  Laterality: N/A;  . URETERAL BIOPSY Right 08/19/2017   Procedure:  Renal Mass BIOPSY;  Surgeon: [name], MD;  Location: [facility];  Service: Urology;  Laterality: Right;  . URETEROSCOPY Right 01/30/2015   Procedure: URETEROSCOPY/ WITH BIOPSY  AND CYTOLOGY BRUSHING;  Surgeon: [name], MD;  Location: [facility];  Service: Urology;  Laterality: Right;  . URETEROSCOPY Right 08/20/2015   Procedure: URETEROSCOPY;  Surgeon: [name], MD;  Location: [facility];  Service: Urology;  Laterality: Right;  . URETEROSCOPY Right 09/12/2015   Procedure: URETEROSCOPY;  Surgeon: [name], MD;  Location: [facility];  Service: Urology;  Laterality: Right;  . URETEROSCOPY Right 02/26/2016   Procedure: URETEROSCOPY;  Surgeon: [name], MD;  Location: [facility];  Service: Urology;  Laterality: Right;    Social History   Socioeconomic History  . Marital status: Married    Spouse name: Not on file  . Number of children: Not on file  . Years of education: Not on file  . Highest education level: Not on file  Occupational History  . Not on file  Social Needs  . Financial resource strain: Not on file  . Food insecurity:    Worry: Not on file    Inability: Not on file  . Transportation needs:    Medical: Not on file    Non-medical: Not on file  Tobacco Use  . Smoking status: Never Smoker  . Smokeless tobacco: Never Used  Substance and Sexual Activity  . Alcohol use: No    Alcohol/week: 0.0 standard drinks  . Drug use: No  . Sexual activity: Not on file  Lifestyle  . Physical activity:    Days per week: Not on file    Minutes per session: Not on file  . Stress: Not on file  Relationships  . Social connections:    Talks on phone: Not on file    Gets together: Not on file    Attends religious service: Not on file    Active member of club or organization: Not on file    Attends meetings of clubs or organizations: Not on file    Relationship status: Not on file  . Intimate partner violence:    Fear of current or ex partner: Not on file    Emotionally abused: Not on file    Physically abused: Not on file    Forced sexual activity: Not on file  Other Topics Concern  . Not on file  Social History Narrative  . Not on file      Family History  Problem Relation Age of Onset  . Hypertension Mother   . Hypertension Father   . Prostate cancer Brother   . Kidney disease Neg Hx   . Kidney cancer Neg Hx   . Bladder Cancer Neg Hx      Current Outpatient Medications:  .  acetaminophen (TYLENOL) 500 MG tablet, Take 1,000 mg by mouth daily as needed for moderate pain. , Disp: , Rfl:  .  benazepril-hydrochlorthiazide (LOTENSIN HCT) 20-12.5 MG tablet, Take 1 tablet by mouth daily. , Disp: , Rfl:  .  dexamethasone (DECADRON) 4 MG tablet, Take 2 tablets (8 mg total) by mouth daily. Start the day after carboplatin chemotherapy for 2 days., Disp: 30 tablet, Rfl: 1 .  diazepam (VALIUM) 2 MG tablet, Take 2 mg by mouth every 12 (twelve) hours as needed for anxiety., Disp: , Rfl:  .  docusate sodium (COLACE) 100 MG capsule, Take 100 mg by mouth daily as needed for mild constipation., Disp: , Rfl:  .  fenofibrate micronized (  LOFIBRA) 134 MG capsule, Take 134 mg by mouth daily before breakfast. , Disp: , Rfl:  .  fluticasone (FLONASE) 50 MCG/ACT nasal spray, Place 2 sprays into both nostrils at bedtime., Disp: , Rfl:  .  hydrocortisone cream 1 %, Apply 1 application topically daily as needed for itching., Disp: , Rfl:  .  lidocaine-prilocaine (EMLA) cream, Apply to affected area once, Disp: 30 g, Rfl: 3 .  LORazepam (ATIVAN) 0.5 MG tablet, Take 1 tablet (0.5 mg total) by mouth every 6 (six) hours as needed (Nausea or vomiting)., Disp: 30 tablet, Rfl: 0 .  Multiple Vitamin (MULTIVITAMIN WITH MINERALS) TABS tablet, Take 1 tablet by mouth daily., Disp: , Rfl:  .  Multiple Vitamins-Minerals (ICAPS AREDS 2) CAPS, Take 1 capsule by mouth 2 (two) times daily., Disp: , Rfl:  .  ondansetron (ZOFRAN) 8 MG tablet, Take 1 tablet (8 mg total) by mouth 2 (two) times daily as needed for refractory nausea / vomiting. Start on day 3 after carboplatin chemo., Disp: 30 tablet, Rfl: 1 .  Potassium 99 MG TABS, Take 99 mg by mouth at bedtime., Disp: ,  Rfl:  .  prochlorperazine (COMPAZINE) 10 MG tablet, Take 1 tablet (10 mg total) by mouth every 6 (six) hours as needed (Nausea or vomiting)., Disp: 30 tablet, Rfl: 1 .  psyllium (METAMUCIL) 58.6 % packet, Take 1 packet by mouth daily as needed (constipation). , Disp: , Rfl:  .  tamsulosin (FLOMAX) 0.4 MG CAPS capsule, Take 1 capsule (0.4 mg total) by mouth daily. (Patient taking differently: Take 0.4 mg by mouth every evening. ), Disp: 90 capsule, Rfl: 3  Physical exam:  Vitals:   12/15/17 1329  BP: (!) 161/74  Pulse: 69  Resp: 18  Temp: (!) 96.9 F (36.1 C)  Weight: 195 lb 9.6 oz (88.7 kg)   Physical Exam  Constitutional: He is oriented to person, place, and time. He appears well-developed and well-nourished.  HENT:  Head: Normocephalic and atraumatic.  Eyes: Pupils are equal, round, and reactive to light. EOM are normal.  Neck: Normal range of motion.  Cardiovascular: Normal rate, regular rhythm and normal heart sounds.  Pulmonary/Chest: Effort normal and breath sounds normal.  Abdominal: Soft. Bowel sounds are normal.  Neurological: He is alert and oriented to person, place, and time.  Skin: Skin is warm and dry.     CMP Latest Ref Rng & Units 12/15/2017  Glucose 70 - 99 mg/dL 159(H)  BUN 8 - 23 mg/dL 18  Creatinine 0.61 - 1.24 mg/dL 1.23  Sodium 135 - 145 mmol/L 134(L)  Potassium 3.5 - 5.1 mmol/L 3.7  Chloride 98 - 111 mmol/L 100  CO2 22 - 32 mmol/L 26  Calcium 8.9 - 10.3 mg/dL 9.1  Total Protein 6.5 - 8.1 g/dL -  Total Bilirubin 0.3 - 1.2 mg/dL -  Alkaline Phos 38 - 126 U/L -  AST 15 - 41 U/L -  ALT 0 - 44 U/L -   CBC Latest Ref Rng & Units 12/15/2017  WBC 4.0 - 10.5 K/uL 4.7  Hemoglobin 13.0 - 17.0 g/dL 10.7(L)  Hematocrit 39.0 - 52.0 % 31.9(L)  Platelets 150 - 400 K/uL 215    No images are attached to the encounter.  Ct Chest W Contrast  Result Date: 12/09/2017 CLINICAL DATA:  Restaging metastatic uroepithelial carcinoma. 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 CONTRAST:  69mL ISOVUE-300 IOPAMIDOL (ISOVUE-300) INJECTION 61% COMPARISON:  PET-CT 09/21/2017 and chest CT 09/04/2017 FINDINGS: CT CHEST FINDINGS Cardiovascular: The heart is normal in size. No pericardial effusion. Stable mild scattered atherosclerotic calcifications involving the thoracic aorta but no dissection or aneurysm. The branch vessels are patent. Scattered coronary artery calcifications. Mediastinum/Nodes: No mediastinal or hilar mass or lymphadenopathy. Small scattered lymph nodes are stable. The esophagus is grossly normal. Lungs/Pleura: Smoothly marginated and slightly lobular right upper lobe lung lesion measures 13.5 x 10.5 mm on image number 75 previously measured 14.5 x 11 mm. 2.5 mm left lower lobe nodule on image number 84 is unchanged since the CT scan of 09/04/2017. Stable 3 mm nodule in the left upper lobe on image number 64. No other pulmonary lesions are identified. No acute pulmonary findings. Musculoskeletal: No significant bony findings. No worrisome bone lesions. CT ABDOMEN PELVIS FINDINGS Hepatobiliary: No focal hepatic lesions to suggest metastatic disease. Gallstones are noted the gallbladder. No common bile duct dilatation. Pancreas: No mass, inflammation or ductal dilatation. There is a small stable cyst noted in the lower pancreatic head no change when compared to the prior CT scan from 08/06/2017. It measures 11 x 7 mm. Spleen: Normal size.  No focal lesions. Adrenals/Urinary Tract: The adrenal glands are unremarkable and stable. Stable of right renal cyst. There is a double-J ureteral stent noted on the right side. There is high attenuation material in the right renal pelvis. This appears to be enhancing and is worrisome for transitional cell  neoplasm. It appears of progressive when compared to the prior CT abdomen from 08/06/2017. The left kidney, left ureter and bladder are unremarkable. Stomach/Bowel: The stomach, duodenum, small bowel and colon are grossly normal. Sigmoid diverticulosis without findings for acute diverticulitis. No inflammatory changes, mass lesions or obstructive findings. The terminal ileum and appendix are normal. Vascular/Lymphatic: Stable atherosclerotic calcifications involving the distal aorta and iliac arteries. No aneurysm or dissection. The major venous structures are patent. No mesenteric or retroperitoneal mass or lymphadenopathy. Small scattered lymph nodes are stable. 11.5 mm right obturator lymph node on image number 116 previously measured 16.5 mm. No new pelvic lymph nodes. No inguinal adenopathy. Reproductive: The prostate gland and seminal vesicles are unremarkable. Other: No inguinal mass or hernia. Musculoskeletal: No significant bony findings. No lytic or destructive bone lesions. IMPRESSION: 1. The right upper lobe pulmonary nodule is slightly smaller. Other pulmonary nodules are stable. No new/progressive findings in the chest. 2. Right-sided double-J ureteral stent. No obvious distal right ureteral tumor. However, there is progressive appearing tumor in the right renal pelvis. Retrograde study may be helpful for further evaluation. 3. Interval decrease in size of the right obturator lymph node. No new/progressive adenopathy. Electronically Signed   By: Marijo Sanes M.D.   On: 12/09/2017 16:26   Ct Abdomen Pelvis W Contrast  Result Date: 12/09/2017 CLINICAL DATA:  Restaging metastatic uroepithelial carcinoma. 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 CONTRAST:  82mL ISOVUE-300  IOPAMIDOL (ISOVUE-300) INJECTION 61% COMPARISON:  PET-CT 09/21/2017 and chest CT 09/04/2017 FINDINGS: CT CHEST FINDINGS Cardiovascular: The heart is normal in size. No pericardial effusion. Stable mild scattered atherosclerotic calcifications involving the thoracic aorta but no dissection or aneurysm. The branch vessels are patent. Scattered coronary artery calcifications. Mediastinum/Nodes: No  mediastinal or hilar mass or lymphadenopathy. Small scattered lymph nodes are stable. The esophagus is grossly normal. Lungs/Pleura: Smoothly marginated and slightly lobular right upper lobe lung lesion measures 13.5 x 10.5 mm on image number 75 previously measured 14.5 x 11 mm. 2.5 mm left lower lobe nodule on image number 84 is unchanged since the CT scan of 09/04/2017. Stable 3 mm nodule in the left upper lobe on image number 64. No other pulmonary lesions are identified. No acute pulmonary findings. Musculoskeletal: No significant bony findings. No worrisome bone lesions. CT ABDOMEN PELVIS FINDINGS Hepatobiliary: No focal hepatic lesions to suggest metastatic disease. Gallstones are noted the gallbladder. No common bile duct dilatation. Pancreas: No mass, inflammation or ductal dilatation. There is a small stable cyst noted in the lower pancreatic head no change when compared to the prior CT scan from 08/06/2017. It measures 11 x 7 mm. Spleen: Normal size.  No focal lesions. Adrenals/Urinary Tract: The adrenal glands are unremarkable and stable. Stable of right renal cyst. There is a double-J ureteral stent noted on the right side. There is high attenuation material in the right renal pelvis. This appears to be enhancing and is worrisome for transitional cell neoplasm. It appears of progressive when compared to the prior CT abdomen from 08/06/2017. The left kidney, left ureter and bladder are unremarkable. Stomach/Bowel: The stomach, duodenum, small bowel and colon are grossly normal. Sigmoid diverticulosis without  findings for acute diverticulitis. No inflammatory changes, mass lesions or obstructive findings. The terminal ileum and appendix are normal. Vascular/Lymphatic: Stable atherosclerotic calcifications involving the distal aorta and iliac arteries. No aneurysm or dissection. The major venous structures are patent. No mesenteric or retroperitoneal mass or lymphadenopathy. Small scattered lymph nodes are stable. 11.5 mm right obturator lymph node on image number 116 previously measured 16.5 mm. No new pelvic lymph nodes. No inguinal adenopathy. Reproductive: The prostate gland and seminal vesicles are unremarkable. Other: No inguinal mass or hernia. Musculoskeletal: No significant bony findings. No lytic or destructive bone lesions. IMPRESSION: 1. The right upper lobe pulmonary nodule is slightly smaller. Other pulmonary nodules are stable. No new/progressive findings in the chest. 2. Right-sided double-J ureteral stent. No obvious distal right ureteral tumor. However, there is progressive appearing tumor in the right renal pelvis. Retrograde study may be helpful for further evaluation. 3. Interval decrease in size of the right obturator lymph node. No new/progressive adenopathy. Electronically Signed   By: Marijo Sanes M.D.   On: 12/09/2017 16:26     Assessment and plan- Patient is a 84 y.o. 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  He is here for on treatment assessment prior to cycle 4-day 1 of carboplatin and Gemzar chemotherapy  I have reviewed CT chest abdomen pelvis images independently.  I discussed findings with the patient.  Overall there has been a decrease in the size of the obturator lymph node as well as right upper lobe lung nodule.  Although the lung nodule was never biopsied the fact that it has decreased in size is suggestive that this is indeed a metastatic lesion from  urothelial carcinoma.  Hilar adenopathy is mild and stable.  There is a concern of some progression in the size of the primary tumor at the renal pelvis.  On review of images this does not appear to be significant.  However I will discuss his case at the tumor board  this week.  At this time I plan to continue 3 more months of carboplatin and gemcitabine 1 week on 1 week off and get a repeat scan after that.  If scans continue to show stable disease, I will stop chemotherapy at that point and continue surveillance scans every 3 months and consider treatment with progression.  Patient and his wife are in understanding of the plan.  Patient does have some moderate anemia likely secondary to chemotherapy.  Iron studies B12 and folate are within normal limits.  Continue to monitor  I will see him back in 2 weeks time with CBC and CMP for cycle 4-day 15 of chemotherapy.   Visit Diagnosis 1. Encounter for antineoplastic chemotherapy   2. 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 Antineoplastic chemotherapy induced anemia      Dr. Randa Evens, MD, MPH [facility] at [facility] [phone] 12/18/2017 8:33 AM

## 2017-12-21 ENCOUNTER — Telehealth: Payer: Self-pay | Admitting: Radiology

## 2017-12-21 NOTE — Telephone Encounter (Signed)
LMOM. Need to notify patient of pre-admission testing appointment on 12/23/2017 at 2:45.

## 2017-12-23 ENCOUNTER — Encounter
Admission: RE | Admit: 2017-12-23 | Discharge: 2017-12-23 | Disposition: A | Discharge status: Home / Self Care | Payer: Medicare HMO | Source: Ambulatory Visit | Attending: Urology | Admitting: Urology

## 2017-12-23 ENCOUNTER — Other Ambulatory Visit: Payer: Self-pay

## 2017-12-23 NOTE — Patient Instructions (Signed)
Your procedure is scheduled on: Mon 12/28/17 Report to [facility] ON 2ND FLOOR MEDICAL MALL ENTRANCE. To find out your arrival time please call [phone] between 1PM - 3PM on Fri 12/25/17.  Remember: Instructions that are not followed completely may result in serious medical risk, up to and including death, or upon the discretion of your surgeon and anesthesiologist your surgery may need to be rescheduled.     _X__ 1. Do not eat food after midnight the night before your procedure.                 No gum chewing or hard candies. You may drink clear liquids up to 2 hours                 before you are scheduled to arrive for your surgery- DO not drink clear                 liquids within 2 hours of the start of your surgery.                 Clear Liquids include:  water, apple juice without pulp, clear carbohydrate                 drink such as Clearfast or Gatorade, Black Coffee or Tea (Do not add                 anything to coffee or tea).  __X__2.  On the morning of surgery brush your teeth with toothpaste and water, you                 may rinse your mouth with mouthwash if you wish.  Do not swallow any              toothpaste of mouthwash.     _X__ 3.  No Alcohol for 24 hours before or after surgery.   _X__ 4.  Do Not Smoke or use e-cigarettes For 24 Hours Prior to Your Surgery.                 Do not use any chewable tobacco products for at least 6 hours prior to                 surgery.  ____  5.  Bring all medications with you on the day of surgery if instructed.   __X__  6.  Notify your doctor if there is any change in your medical condition      (cold, fever, infections).     Do not wear jewelry, make-up, hairpins, clips or nail polish. Do not wear lotions, powders, or perfumes.  Do not shave 48 hours prior to surgery. Men may shave face and neck. Do not bring valuables to the hospital.    [facility] is not responsible for any belongings or  valuables.  Contacts, dentures/partials or body piercings may not be worn into surgery. Bring a case for your contacts, glasses or hearing aids, a denture cup will be supplied. Leave your suitcase in the car. After surgery it may be brought to your room. For patients admitted to the hospital, discharge time is determined by your treatment team.   Patients discharged the day of surgery will not be allowed to drive home.   Please read over the following fact sheets that you were given:   MRSA Information  __X__ Take these medicines the morning of surgery with A SIP OF WATER:  1. fenofibrate micronized (LOFIBRA)  2. tamsulosin (FLOMAX)   3.   4.  5.  6.  ____ Fleet Enema (as directed)   ____ Use CHG Soap/SAGE wipes as directed  ____ Use inhalers on the day of surgery  ____ Stop metformin/Janumet/Farxiga 2 days prior to surgery    ____ Take 1/2 of usual insulin dose the night before surgery. No insulin the morning          of surgery.   ____ Stop Blood Thinners Coumadin/Plavix/Xarelto/Pleta/Pradaxa/Eliquis/Effient/Aspirin  on   Or contact your Surgeon, Cardiologist or Medical Doctor regarding  ability to stop your blood thinners  __X__ Stop Anti-inflammatories 7 days before surgery such as Advil, Ibuprofen, Motrin,  BC or Goodies Powder, Naprosyn, Naproxen, Aleve, Aspirin    __X__ Stop all herbal supplements, fish oil or vitamin E until after surgery.    ____ Bring C-Pap to the hospital.

## 2017-12-27 MED ORDER — CEFAZOLIN SODIUM-DEXTROSE 2-4 GM/100ML-% IV SOLN
2.0000 g | INTRAVENOUS | Status: AC
  Administered 2017-12-28: 2 g via INTRAVENOUS

## 2017-12-28 ENCOUNTER — Ambulatory Visit: Payer: Medicare HMO | Admitting: Certified Registered Nurse Anesthetist

## 2017-12-28 ENCOUNTER — Ambulatory Visit
Admission: RE | Admit: 2017-12-28 | Discharge: 2017-12-28 | Disposition: A | Discharge status: Home / Self Care | Payer: Medicare HMO | Source: Ambulatory Visit | Attending: Urology | Admitting: Urology

## 2017-12-28 ENCOUNTER — Encounter
Admission: RE | Disposition: A | Discharge status: Home / Self Care | Payer: Self-pay | Source: Ambulatory Visit | Attending: Urology

## 2017-12-28 ENCOUNTER — Encounter: Payer: Self-pay | Admitting: *Deleted

## 2017-12-28 ENCOUNTER — Other Ambulatory Visit: Payer: Self-pay

## 2017-12-28 DIAGNOSIS — Z9842 Cataract extraction status, left eye: Secondary | ICD-10-CM | POA: Diagnosis not present

## 2017-12-28 DIAGNOSIS — Z85828 Personal history of other malignant neoplasm of skin: Secondary | ICD-10-CM | POA: Diagnosis not present

## 2017-12-28 DIAGNOSIS — Z885 Allergy status to narcotic agent status: Secondary | ICD-10-CM | POA: Insufficient documentation

## 2017-12-28 DIAGNOSIS — Z9221 Personal history of antineoplastic chemotherapy: Secondary | ICD-10-CM | POA: Insufficient documentation

## 2017-12-28 DIAGNOSIS — Z79899 Other long term (current) drug therapy: Secondary | ICD-10-CM | POA: Diagnosis not present

## 2017-12-28 DIAGNOSIS — Z87442 Personal history of urinary calculi: Secondary | ICD-10-CM | POA: Insufficient documentation

## 2017-12-28 DIAGNOSIS — Z8249 Family history of ischemic heart disease and other diseases of the circulatory system: Secondary | ICD-10-CM | POA: Insufficient documentation

## 2017-12-28 DIAGNOSIS — E785 Hyperlipidemia, unspecified: Secondary | ICD-10-CM | POA: Insufficient documentation

## 2017-12-28 DIAGNOSIS — M1990 Unspecified osteoarthritis, unspecified site: Secondary | ICD-10-CM | POA: Insufficient documentation

## 2017-12-28 DIAGNOSIS — Z9841 Cataract extraction status, right eye: Secondary | ICD-10-CM | POA: Insufficient documentation

## 2017-12-28 DIAGNOSIS — Z882 Allergy status to sulfonamides status: Secondary | ICD-10-CM | POA: Diagnosis not present

## 2017-12-28 DIAGNOSIS — C651 Malignant neoplasm of right renal pelvis: Secondary | ICD-10-CM | POA: Diagnosis not present

## 2017-12-28 DIAGNOSIS — G4700 Insomnia, unspecified: Secondary | ICD-10-CM | POA: Diagnosis not present

## 2017-12-28 DIAGNOSIS — G43909 Migraine, unspecified, not intractable, without status migrainosus: Secondary | ICD-10-CM | POA: Insufficient documentation

## 2017-12-28 DIAGNOSIS — H409 Unspecified glaucoma: Secondary | ICD-10-CM | POA: Insufficient documentation

## 2017-12-28 DIAGNOSIS — G2581 Restless legs syndrome: Secondary | ICD-10-CM | POA: Diagnosis not present

## 2017-12-28 DIAGNOSIS — R42 Dizziness and giddiness: Secondary | ICD-10-CM | POA: Insufficient documentation

## 2017-12-28 DIAGNOSIS — C779 Secondary and unspecified malignant neoplasm of lymph node, unspecified: Secondary | ICD-10-CM | POA: Insufficient documentation

## 2017-12-28 DIAGNOSIS — N400 Enlarged prostate without lower urinary tract symptoms: Secondary | ICD-10-CM | POA: Insufficient documentation

## 2017-12-28 DIAGNOSIS — Z888 Allergy status to other drugs, medicaments and biological substances status: Secondary | ICD-10-CM | POA: Diagnosis not present

## 2017-12-28 DIAGNOSIS — Z8042 Family history of malignant neoplasm of prostate: Secondary | ICD-10-CM | POA: Insufficient documentation

## 2017-12-28 DIAGNOSIS — E871 Hypo-osmolality and hyponatremia: Secondary | ICD-10-CM | POA: Diagnosis not present

## 2017-12-28 DIAGNOSIS — I10 Essential (primary) hypertension: Secondary | ICD-10-CM | POA: Diagnosis not present

## 2017-12-28 DIAGNOSIS — C661 Malignant neoplasm of right ureter: Secondary | ICD-10-CM

## 2017-12-28 HISTORY — PX: CYSTOSCOPY WITH STENT PLACEMENT: SHX5790

## 2017-12-28 SURGERY — CYSTOSCOPY, WITH STENT INSERTION
Anesthesia: General | Laterality: Right

## 2017-12-28 MED ORDER — OXYCODONE HCL 5 MG PO TABS: 5.0000 mg | ORAL_TABLET | Freq: Once | ORAL | Status: DC | PRN

## 2017-12-28 MED ORDER — LACTATED RINGERS IV SOLN
INTRAVENOUS | Status: DC
  Administered 2017-12-28 (×2): via INTRAVENOUS

## 2017-12-28 MED ORDER — LIDOCAINE HCL (CARDIAC) PF 100 MG/5ML IV SOSY
PREFILLED_SYRINGE | INTRAVENOUS | Status: DC | PRN
  Administered 2017-12-28: 100 mg via INTRAVENOUS

## 2017-12-28 MED ORDER — EPHEDRINE SULFATE 50 MG/ML IJ SOLN
INTRAMUSCULAR | Status: DC | PRN
  Administered 2017-12-28: 10 mg via INTRAVENOUS

## 2017-12-28 MED ORDER — PHENYLEPHRINE HCL 10 MG/ML IJ SOLN
INTRAMUSCULAR | Status: AC
  Filled 2017-12-28: qty 1

## 2017-12-28 MED ORDER — FENTANYL CITRATE (PF) 100 MCG/2ML IJ SOLN
INTRAMUSCULAR | Status: AC
  Filled 2017-12-28: qty 2

## 2017-12-28 MED ORDER — CEFAZOLIN SODIUM-DEXTROSE 2-4 GM/100ML-% IV SOLN
INTRAVENOUS | Status: AC
  Filled 2017-12-28: qty 100

## 2017-12-28 MED ORDER — SUCCINYLCHOLINE CHLORIDE 20 MG/ML IJ SOLN
INTRAMUSCULAR | Status: AC
  Filled 2017-12-28: qty 1

## 2017-12-28 MED ORDER — DEXAMETHASONE SODIUM PHOSPHATE 10 MG/ML IJ SOLN
INTRAMUSCULAR | Status: DC | PRN
  Administered 2017-12-28: 5 mg via INTRAVENOUS

## 2017-12-28 MED ORDER — IOTHALAMATE MEGLUMINE 43 % IV SOLN
INTRAVENOUS | Status: DC | PRN
  Administered 2017-12-28: 15 mL via SURGICAL_CAVITY

## 2017-12-28 MED ORDER — FENTANYL CITRATE (PF) 100 MCG/2ML IJ SOLN
INTRAMUSCULAR | Status: DC | PRN
  Administered 2017-12-28: 50 ug via INTRAVENOUS

## 2017-12-28 MED ORDER — FAMOTIDINE 20 MG PO TABS
ORAL_TABLET | ORAL | Status: AC
  Administered 2017-12-28: 20 mg via ORAL
  Filled 2017-12-28: qty 1

## 2017-12-28 MED ORDER — PROPOFOL 10 MG/ML IV BOLUS
INTRAVENOUS | Status: AC
  Filled 2017-12-28: qty 60

## 2017-12-28 MED ORDER — EPHEDRINE SULFATE 50 MG/ML IJ SOLN
INTRAMUSCULAR | Status: AC
  Filled 2017-12-28: qty 1

## 2017-12-28 MED ORDER — FENTANYL CITRATE (PF) 100 MCG/2ML IJ SOLN: 25.0000 ug | INTRAMUSCULAR | Status: DC | PRN

## 2017-12-28 MED ORDER — ONDANSETRON HCL 4 MG/2ML IJ SOLN
INTRAMUSCULAR | Status: DC | PRN
  Administered 2017-12-28: 4 mg via INTRAVENOUS

## 2017-12-28 MED ORDER — ONDANSETRON HCL 4 MG/2ML IJ SOLN
INTRAMUSCULAR | Status: AC
  Filled 2017-12-28: qty 2

## 2017-12-28 MED ORDER — DEXAMETHASONE SODIUM PHOSPHATE 10 MG/ML IJ SOLN
INTRAMUSCULAR | Status: AC
  Filled 2017-12-28: qty 1

## 2017-12-28 MED ORDER — PROPOFOL 10 MG/ML IV BOLUS
INTRAVENOUS | Status: DC | PRN
  Administered 2017-12-28: 150 mg via INTRAVENOUS

## 2017-12-28 MED ORDER — FAMOTIDINE 20 MG PO TABS
20.0000 mg | ORAL_TABLET | Freq: Once | ORAL | Status: AC
  Administered 2017-12-28: 20 mg via ORAL

## 2017-12-28 MED ORDER — PROMETHAZINE HCL 25 MG/ML IJ SOLN: 6.2500 mg | INTRAMUSCULAR | Status: DC | PRN

## 2017-12-28 MED ORDER — LIDOCAINE HCL (PF) 2 % IJ SOLN
INTRAMUSCULAR | Status: AC
  Filled 2017-12-28: qty 10

## 2017-12-28 MED ORDER — OXYCODONE HCL 5 MG/5ML PO SOLN: 5.0000 mg | Freq: Once | ORAL | Status: DC | PRN

## 2017-12-28 SURGICAL SUPPLY — 23 items
BAG DRAIN CYSTO-URO LG1000N (MISCELLANEOUS) ×3 IMPLANT
BRUSH SCRUB EZ  4% CHG (MISCELLANEOUS)
BRUSH SCRUB EZ 4% CHG (MISCELLANEOUS) IMPLANT
CATH URETL 5X70 OPEN END (CATHETERS) ×3 IMPLANT
CONRAY 43 FOR UROLOGY 50M (MISCELLANEOUS) ×3 IMPLANT
COVER WAND RF STERILE (DRAPES) IMPLANT
GLOVE BIO SURGEON STRL SZ 6.5 (GLOVE) ×2 IMPLANT
GLOVE BIO SURGEONS STRL SZ 6.5 (GLOVE) ×1
GOWN STRL REUS W/ TWL LRG LVL3 (GOWN DISPOSABLE) ×2 IMPLANT
GOWN STRL REUS W/TWL LRG LVL3 (GOWN DISPOSABLE) ×4
KIT TURNOVER CYSTO (KITS) ×3 IMPLANT
PACK CYSTO AR (MISCELLANEOUS) ×3 IMPLANT
SENSORWIRE 0.038 NOT ANGLED (WIRE) ×3
SET CYSTO W/LG BORE CLAMP LF (SET/KITS/TRAYS/PACK) ×3 IMPLANT
SOL .9 NS 3000ML IRR  AL (IV SOLUTION) ×2
SOL .9 NS 3000ML IRR UROMATIC (IV SOLUTION) ×1 IMPLANT
STENT URET 6FRX24 CONTOUR (STENTS) IMPLANT
STENT URET 6FRX26 CONTOUR (STENTS) IMPLANT
STENT URO INLAY 6FRX26CM (STENTS) ×3 IMPLANT
SURGILUBE 2OZ TUBE FLIPTOP (MISCELLANEOUS) ×3 IMPLANT
SYRINGE IRR TOOMEY STRL 70CC (SYRINGE) ×3 IMPLANT
WATER STERILE IRR 1000ML POUR (IV SOLUTION) ×3 IMPLANT
WIRE SENSOR 0.038 NOT ANGLED (WIRE) ×1 IMPLANT

## 2017-12-28 NOTE — Anesthesia Procedure Notes (Signed)
Procedure Name: LMA Insertion Date/Time: 12/28/2017 8:42 AM Performed by: Lowry Bowl, CRNA Pre-anesthesia Checklist: Patient identified, Emergency Drugs available, Suction available and Patient being monitored Patient Re-evaluated:Patient Re-evaluated prior to induction Oxygen Delivery Method: Circle system utilized Preoxygenation: Pre-oxygenation with 100% oxygen Induction Type: IV induction Ventilation: Mask ventilation without difficulty LMA: LMA inserted LMA Size: 4.0 Number of attempts: 1 Placement Confirmation: breath sounds checked- equal and bilateral and positive ETCO2 Tube secured with: Tape Dental Injury: Teeth and Oropharynx as per pre-operative assessment

## 2017-12-28 NOTE — Discharge Instructions (Addendum)
You have a ureteral stent in place.  This is a tube that extends from your kidney to your bladder.  This may cause urinary bleeding, burning with urination, and urinary frequency.  Please call our office or present to the ED if you develop fevers >101 or pain which is not able to be controlled with oral pain medications.  You may be given either Flomax and/ or ditropan to help with bladder spasms and stent pain in addition to pain medications.   ° °[facility] °[address], [address] °[place], [place] [address] °[phone] ° ° ° °AMBULATORY SURGERY  °DISCHARGE INSTRUCTIONS ° ° °1) The drugs that you were given will stay in your system until tomorrow so for the next 24 hours you should not: ° °A) Drive an automobile °B) Make any legal decisions °C) Drink any alcoholic beverage ° ° °2) You may resume regular meals tomorrow.  Today it is better to start with liquids and gradually work up to solid foods. ° °You may eat anything you prefer, but it is better to start with liquids, then soup and crackers, and gradually work up to solid foods. ° ° °3) Please notify your doctor immediately if you have any unusual bleeding, trouble breathing, redness and pain at the surgery site, drainage, fever, or pain not relieved by medication. ° ° ° °4) Additional Instructions: ° ° ° ° ° ° ° °Please contact your physician with any problems or Same Day Surgery at [phone], Monday through Friday 6 am to 4 pm, or [facility] [facility] number at [phone]. °

## 2017-12-28 NOTE — Transfer of Care (Signed)
Immediate Anesthesia Transfer of Care Note  Patient: Anthony Gonzales  Procedure(s) Performed: CYSTOSCOPY WITH STENT Exchange (Right )  Patient Location: PACU  Anesthesia Type:General  Level of Consciousness: awake, oriented, drowsy and patient cooperative  Airway & Oxygen Therapy: Patient Spontanous Breathing and Patient connected to face mask oxygen  Post-op Assessment: Report given to RN, Post -op Vital signs reviewed and stable and Patient moving all extremities  Post vital signs: Reviewed and stable  Last Vitals:  Vitals Value Taken Time  BP    Temp    Pulse 83 12/28/2017  9:04 AM  Resp 38 12/28/2017  9:04 AM  SpO2 97 % 12/28/2017  9:04 AM  Vitals shown include unvalidated device data.  Last Pain:  Vitals:   12/28/17 0704  TempSrc: Oral  PainSc: 0-No pain         Complications: No apparent anesthesia complications

## 2017-12-28 NOTE — Interval H&P Note (Signed)
History and Physical Interval Note:  12/28/2017 8:13 AM  Anthony Gonzales  has presented today for surgery, with the diagnosis of right urothelial carcinoma  The various methods of treatment have been discussed with the patient and family. After consideration of risks, benefits and other options for treatment, the patient has consented to  Procedure(s): CYSTOSCOPY WITH STENT Exchange (Right) as a surgical intervention .  The patient's history has been reviewed, patient examined, no change in status, stable for surgery.  I have reviewed the patient's chart and labs.  Questions were answered to the patient's satisfaction.    RRR CTAB  [name]

## 2017-12-28 NOTE — OR Nursing (Signed)
Per PACU nurse, follow up scheduled for 7 months, as instructed by MD.

## 2017-12-28 NOTE — Anesthesia Post-op Follow-up Note (Signed)
Anesthesia QCDR form completed.        

## 2017-12-28 NOTE — Anesthesia Postprocedure Evaluation (Signed)
Anesthesia Post Note  Patient: [facility]  Procedure(s) Performed: CYSTOSCOPY WITH STENT Exchange (Right )  Patient location during evaluation: PACU Anesthesia Type: General Level of consciousness: awake and alert and oriented Pain management: pain level controlled Vital Signs Assessment: post-procedure vital signs reviewed and stable Respiratory status: spontaneous breathing, nonlabored ventilation and respiratory function stable Cardiovascular status: blood pressure returned to baseline and stable Postop Assessment: no signs of nausea or vomiting Anesthetic complications: no     Last Vitals:  Vitals:   12/28/17 0934 12/28/17 0940  BP:  (!) 166/77  Pulse:  70  Resp:  18  Temp: (!) 36.3 C (!) 36 C  SpO2:  96%    Last Pain:  Vitals:   12/28/17 0940  TempSrc: Temporal  PainSc: 0-No pain                 Younique Casad

## 2017-12-28 NOTE — Op Note (Signed)
Date of procedure: 12/28/17  Preoperative diagnosis:  1. Right upper tract urothelial carcinoma  Postoperative diagnosis:  1. Same as above  Procedure: 1. Right ureteral stent exchange 2. Right retrograde pyelogram 3. Cystoscopy  Surgeon: [name], MD  Anesthesia: General  Complications: None  Intraoperative findings: Stent replaced without difficulty, minimal to no encrustation.  EBL: Minimal  Specimens: None  Drains: 6 x 26 French double-J ureteral stent on right, Bard optima  Indication: Anthony Gonzales is a 84 y.o. patient with right upper tract urothelial carcinoma, metastatic managed by chronic indwelling Foley currently on chemotherapy.  He is due today for stent exchange, last placed in 08/2017.  After reviewing the management options for treatment, he elected to proceed with the above surgical procedure(s). We have discussed the potential benefits and risks of the procedure, side effects of the proposed treatment, the likelihood of the patient achieving the goals of the procedure, and any potential problems that might occur during the procedure or recuperation. Informed consent has been obtained.  Description of procedure:  The patient was taken to the operating room and general anesthesia was induced.  The patient was placed in the dorsal lithotomy position, prepped and draped in the usual sterile fashion, and preoperative antibiotics were administered. A preoperative time-out was performed.   A 21 French scope was advanced per urethra into the bladder.  Attention was turned to the right ureteral orifice which a ureteral stent was seen emanating.  Stent graspers were then used to grasp the distal coil of the stent and bring it to level the urethral meatus.  The stent was then cannulated given sensor wire up to level of the presumed kidney.  The stent was removed and noted to have minimal to no encrustation leave the wire in place.  The safety wire was then  backloaded over rigid cystoscope.  A 6 x 26 French double-J ureteral stent was then advanced over the wire up to level the renal pelvis.  The wires partially drawn until focal stone within the renal pelvis.  The wires and fully withdrawn a full coils noted within the bladder.  Finally, a 5 [place] open-ended ureteral catheter was placed alongside of the stent up to level of the mid ureter identical retrograde pyelogram was performed which showed a filling defect within the upper tract in the setting of known carcinoma in the stent and optimal position.  The bladder was carefully inspected and there is no lesions appreciated within the bladder.  A wid[place] diverticulum was appreciated on the left side of the bladder wall adjacent to the UO.  The bladder was then drained and the scope was removed.  Patient was then clean and dry, repositioned supine position, reversed from anesthesia, taken to PACU in stable condition.  Plan: I will have the patient follow-up in 7 months with plans to exchange in 8 months given minimal to no encrustation.  The patient is agreeable this plan.  [name], M.D.

## 2017-12-28 NOTE — Anesthesia Preprocedure Evaluation (Signed)
Anesthesia Evaluation  Patient identified by MRN, date of birth, ID band Patient awake    Reviewed: Allergy & Precautions, NPO status , Patient's Chart, lab work & pertinent test results  History of Anesthesia Complications Negative for: history of anesthetic complications  Airway Mallampati: III  TM Distance: <3 FB Neck ROM: Full    Dental no notable dental hx.    Pulmonary neg pulmonary ROS, neg sleep apnea, neg COPD,    breath sounds clear to auscultation- rhonchi (-) wheezing      Cardiovascular hypertension, (-) CAD, (-) Past MI, (-) Cardiac Stents and (-) CABG  Rhythm:Regular Rate:Normal - Systolic murmurs and - Diastolic murmurs    Neuro/Psych  Headaches, negative psych ROS   GI/Hepatic negative GI ROS, Neg liver ROS,   Endo/Other  negative endocrine ROSneg diabetes  Renal/GU Renal InsufficiencyRenal disease     Musculoskeletal  (+) Arthritis ,   Abdominal (+) - obese,   Peds  Hematology negative hematology ROS (+)   Anesthesia Other Findings Past Medical History: No date: Arthritis 08/23/2013: Benign fibroma of prostate No date: BPH (benign prostatic hyperplasia) 08/23/2013: Calculus of kidney No date: Glaucoma     Comment:  no drops in 3 mo pressure good, pt denies glaucoma, eye               pressure has been measuring alright. No date: History of kidney stones No date: HLD (hyperlipidemia) No date: HOH (hard of hearing)     Comment:  Left Hearing Aid 12/26/2014: HTN (hypertension) No date: Hypertension 12/26/2014: Hyponatremia No date: Migraines     Comment:  history of migraines when he was younger. No date: Restless leg syndrome No date: Sinus drainage No date: Skin cancer No date: Urothelial cancer ([place])     Comment:  chemo tx's. 12/26/2014: UTI (lower urinary tract infection) No date: Vertigo   Reproductive/Obstetrics                             Anesthesia  Physical Anesthesia Plan  ASA: II  Anesthesia Plan: General   Post-op Pain Management:    Induction: Intravenous  PONV Risk Score and Plan: 1 and Ondansetron and Midazolam  Airway Management Planned: LMA  Additional Equipment:   Intra-op Plan:   Post-operative Plan:   Informed Consent: I have reviewed the patients History and Physical, chart, labs and discussed the procedure including the risks, benefits and alternatives for the proposed anesthesia with the patient or authorized representative who has indicated his/her understanding and acceptance.   Dental advisory given  Plan Discussed with: CRNA and Anesthesiologist  Anesthesia Plan Comments:         Anesthesia Quick Evaluation

## 2017-12-29 ENCOUNTER — Inpatient Hospital Stay: Payer: Medicare HMO

## 2017-12-29 ENCOUNTER — Inpatient Hospital Stay (HOSPITAL_BASED_OUTPATIENT_CLINIC_OR_DEPARTMENT_OTHER): Payer: Medicare HMO | Admitting: Oncology

## 2017-12-29 ENCOUNTER — Encounter: Payer: Self-pay | Admitting: Oncology

## 2017-12-29 VITALS — BP 131/78 | HR 76 | Temp 97.4°F | Resp 18 | Ht 72.0 in | Wt 197.7 lb

## 2017-12-29 DIAGNOSIS — Z5111 Encounter for antineoplastic chemotherapy: Secondary | ICD-10-CM

## 2017-12-29 DIAGNOSIS — T451X5A Adverse effect of antineoplastic and immunosuppressive drugs, initial encounter: Secondary | ICD-10-CM

## 2017-12-29 DIAGNOSIS — C679 Malignant neoplasm of bladder, unspecified: Secondary | ICD-10-CM

## 2017-12-29 DIAGNOSIS — R590 Localized enlarged lymph nodes: Secondary | ICD-10-CM

## 2017-12-29 DIAGNOSIS — C669 Malignant neoplasm of unspecified ureter: Secondary | ICD-10-CM

## 2017-12-29 DIAGNOSIS — D6481 Anemia due to antineoplastic chemotherapy: Secondary | ICD-10-CM | POA: Diagnosis not present

## 2017-12-29 DIAGNOSIS — Z79899 Other long term (current) drug therapy: Secondary | ICD-10-CM

## 2017-12-29 DIAGNOSIS — Z8042 Family history of malignant neoplasm of prostate: Secondary | ICD-10-CM

## 2017-12-29 DIAGNOSIS — Z85828 Personal history of other malignant neoplasm of skin: Secondary | ICD-10-CM

## 2017-12-29 DIAGNOSIS — C7801 Secondary malignant neoplasm of right lung: Secondary | ICD-10-CM | POA: Diagnosis not present

## 2017-12-29 DIAGNOSIS — I10 Essential (primary) hypertension: Secondary | ICD-10-CM

## 2017-12-29 DIAGNOSIS — Z8249 Family history of ischemic heart disease and other diseases of the circulatory system: Secondary | ICD-10-CM

## 2017-12-29 LAB — BASIC METABOLIC PANEL
Anion gap: 7 (ref 5–15)
BUN: 17 mg/dL (ref 8–23)
CALCIUM: 9 mg/dL (ref 8.9–10.3)
CO2: 28 mmol/L (ref 22–32)
Chloride: 100 mmol/L (ref 98–111)
Creatinine, Ser: 1.24 mg/dL (ref 0.61–1.24)
GFR, EST AFRICAN AMERICAN: 60 mL/min — AB (ref >60)
GFR, EST NON AFRICAN AMERICAN: 52 mL/min — AB (ref >60)
Glucose, Bld: 117 mg/dL — ABNORMAL HIGH (ref 70–99)
Potassium: 3.5 mmol/L (ref 3.5–5.1)
SODIUM: 135 mmol/L (ref 135–145)

## 2017-12-29 LAB — CBC WITH DIFFERENTIAL/PLATELET
ABS IMMATURE GRANULOCYTES: 0.03 10*3/uL (ref 0.00–0.07)
BASOS ABS: 0 10*3/uL (ref 0.0–0.1)
Basophils Relative: 0 %
EOS PCT: 1 %
Eosinophils Absolute: 0.1 10*3/uL (ref 0.0–0.5)
HEMATOCRIT: 29.9 % — AB (ref 39.0–52.0)
HEMOGLOBIN: 10.3 g/dL — AB (ref 13.0–17.0)
Immature Granulocytes: 1 %
LYMPHS ABS: 1.2 10*3/uL (ref 0.7–4.0)
Lymphocytes Relative: 22 %
MCH: 30.6 pg (ref 26.0–34.0)
MCHC: 34.4 g/dL (ref 30.0–36.0)
MCV: 88.7 fL (ref 80.0–100.0)
MONO ABS: 0.7 10*3/uL (ref 0.1–1.0)
Monocytes Relative: 13 %
NRBC: 0 % (ref 0.0–0.2)
Neutro Abs: 3.3 10*3/uL (ref 1.7–7.7)
Neutrophils Relative %: 63 %
Platelets: 200 10*3/uL (ref 150–400)
RBC: 3.37 MIL/uL — ABNORMAL LOW (ref 4.22–5.81)
RDW: 15.7 % — ABNORMAL HIGH (ref 11.5–15.5)
WBC: 5.2 10*3/uL (ref 4.0–10.5)

## 2017-12-29 MED ORDER — DEXAMETHASONE SODIUM PHOSPHATE 10 MG/ML IJ SOLN
10.0000 mg | Freq: Once | INTRAMUSCULAR | Status: AC
  Administered 2017-12-29: 10 mg via INTRAVENOUS
  Filled 2017-12-29: qty 1

## 2017-12-29 MED ORDER — SODIUM CHLORIDE 0.9 % IV SOLN
Freq: Once | INTRAVENOUS | Status: AC
  Administered 2017-12-29: 11:00:00 via INTRAVENOUS
  Filled 2017-12-29: qty 250

## 2017-12-29 MED ORDER — SODIUM CHLORIDE 0.9 % IV SOLN
1600.0000 mg | Freq: Once | INTRAVENOUS | Status: AC
  Administered 2017-12-29: 1600 mg via INTRAVENOUS
  Filled 2017-12-29: qty 26.3

## 2017-12-29 MED ORDER — PALONOSETRON HCL INJECTION 0.25 MG/5ML
0.2500 mg | Freq: Once | INTRAVENOUS | Status: AC
  Administered 2017-12-29: 0.25 mg via INTRAVENOUS
  Filled 2017-12-29: qty 5

## 2017-12-29 MED ORDER — SODIUM CHLORIDE 0.9% FLUSH
10.0000 mL | INTRAVENOUS | Status: DC | PRN
  Administered 2017-12-29: 10 mL via INTRAVENOUS
  Filled 2017-12-29: qty 10

## 2017-12-29 MED ORDER — SODIUM CHLORIDE 0.9 % IV SOLN
159.6000 mg | Freq: Once | INTRAVENOUS | Status: AC
  Administered 2017-12-29: 160 mg via INTRAVENOUS
  Filled 2017-12-29: qty 16

## 2017-12-29 MED ORDER — HEPARIN SOD (PORK) LOCK FLUSH 100 UNIT/ML IV SOLN
500.0000 [IU] | Freq: Once | INTRAVENOUS | Status: DC
  Filled 2017-12-29: qty 5

## 2017-12-29 MED ORDER — DEXAMETHASONE 4 MG PO TABS: 8.0000 mg | ORAL_TABLET | Freq: Every day | ORAL | 1 refills | Status: DC

## 2017-12-29 MED ORDER — SODIUM CHLORIDE 0.9 % IV SOLN: 10.0000 mg | Freq: Once | INTRAVENOUS | Status: DC

## 2017-12-29 MED ORDER — HEPARIN SOD (PORK) LOCK FLUSH 100 UNIT/ML IV SOLN
500.0000 [IU] | Freq: Once | INTRAVENOUS | Status: AC | PRN
  Administered 2017-12-29: 500 [IU]

## 2017-12-29 NOTE — Progress Notes (Addendum)
Hematology/Oncology Consult note [facility]  [phone] [phone]  Patient Care Team: Idelle Crouch, MD as PCP - General (Internal Medicine)   Name of the patient: Anthony Gonzales  [id number]  11-Dec-1931   Date of visit: 12/29/17  Diagnosis- Metastatic urothelial carcinoma with possible mets to the obturator node. Indeterminate hilar lymph nodes and LUL lung nodule   Chief complaint/ Reason for visit-on treatment assessment prior to cycle 4-day 15 of carboplatin and gemcitabine  Heme/Onc history: patient is a 84 year old male with past medical history significant for hypertension and long-standing history of superficial bladder cancer for which he sees [name]. He has undergone TURBT as well as ureteroscopy since 2017 along with mitomycin as well in the past. Most recently he underwent CT abdomen on 08/06/2017 which showed a soft tissue mass in the right renal pelvis concerning for upper tract urothelial neoplasm. Soft tissue fullness at the right ureterovesical junction with proximal right hydroureteronephrosis. Several millimeter attenuation lesion in the pancreatic head.  He underwent diagnostic ureteroscopy and was found to have 2 high-grade lesions within his right kidney. There was a nodular high-grade appearing lesion in the right anterior renal pelvis. There was also a second ureteral tumor fungating from the right ureteral orifice extending into the distal ureter also consistent with high-grade invasive urothelial carcinoma. Muscle invasion could not be assessed. He also underwent a CT chest which showed a rounded nodule in the right upper lobe measuring 14 mm concerning for metastases. 2 other 3 mm lesions were also noted in the left upper lobe likely benign  Plan initially was neoadjuvant chemotherapy followed by possible surgery but given the presence of lung lesion patient has been referred to oncology for the  same.  PET/CT on 09/21/17 showed:IMPRESSION: 1. Hypermetabolic right obturator lymph node is most indicative of metastatic disease. 2. Mildly hypermetabolic left hilar lymph nodes are nonspecific. Continued attention on follow-up exams is warranted. 3. Right upper lobe pulmonary nodule shows metabolism after just above blood pool and is therefore indeterminate. Continued attention on follow-up exams is warranted. 4. Aortic atherosclerosis (ICD10-170.0). Coronary artery calcification. 5. Cholelithiasis   Plan is to proceed with 4-6 cycles of carboplatin/ gemzar1 week on and one-week off as patient could not tolerate 2-week on and one week off regimen. Cycle 1 started on 09/22/2017  Interval history-patient is tolerating chemotherapy well without any significant nausea and vomiting.  He does report some trouble sleeping at night for which she has been taking Valium prescribed by his primary care doctor.  Denies other complaints today  ECOG PS- 1 Pain scale- 0 Opioid associated constipation- no  Review of systems- Review of Systems  Constitutional: Negative for chills, fever, malaise/fatigue and weight loss.  HENT: Negative for congestion, ear discharge and nosebleeds.   Eyes: Negative for blurred vision.  Respiratory: Negative for cough, hemoptysis, sputum production, shortness of breath and wheezing.   Cardiovascular: Negative for chest pain, palpitations, orthopnea and claudication.  Gastrointestinal: Negative for abdominal pain, blood in stool, constipation, diarrhea, heartburn, melena, nausea and vomiting.  Genitourinary: Negative for dysuria, flank pain, frequency, hematuria and urgency.  Musculoskeletal: Negative for back pain, joint pain and myalgias.  Skin: Negative for rash.  Neurological: Negative for dizziness, tingling, focal weakness, seizures, weakness and headaches.  Endo/Heme/Allergies: Does not bruise/bleed easily.  Psychiatric/Behavioral: Negative for depression  and suicidal ideas. The patient has insomnia.       Allergies  Allergen Reactions  . Demerol [Meperidine] Nausea And Vomiting  .  Lipitor [Atorvastatin] Swelling  . Sulfa Antibiotics Nausea And Vomiting and Rash     Past Medical History:  Diagnosis Date  . Arthritis   . Benign fibroma of prostate 08/23/2013  . BPH (benign prostatic hyperplasia)   . Calculus of kidney 08/23/2013  . Glaucoma    no drops in 3 mo pressure good, pt denies glaucoma, eye pressure has been measuring alright.  . History of kidney stones   . HLD (hyperlipidemia)   . HOH (hard of hearing)    Left Hearing Aid  . HTN (hypertension) 12/26/2014  . Hypertension   . Hyponatremia 12/26/2014  . Migraines    history of migraines when he was younger.  [name] Restless leg syndrome   . Sinus drainage   . Skin cancer   . Urothelial cancer ([place])    chemo tx's.  [name] UTI (lower urinary tract infection) 12/26/2014  . Vertigo      Past Surgical History:  Procedure Laterality Date  . COLONOSCOPY    . CYSTOSCOPY W/ RETROGRADES Right 01/30/2015   Procedure: CYSTOSCOPY WITH RETROGRADE PYELOGRAM;  Surgeon: [name], MD;  Location: [facility];  Service: Urology;  Laterality: Right;  . CYSTOSCOPY W/ RETROGRADES Bilateral 02/26/2016   Procedure: CYSTOSCOPY WITH RETROGRADE PYELOGRAM;  Surgeon: [name], MD;  Location: [facility];  Service: Urology;  Laterality: Bilateral;  . CYSTOSCOPY W/ URETERAL STENT PLACEMENT Right 08/20/2015   Procedure: CYSTOSCOPY WITH RETROGRADE PYELOGRAM/POSSIBLE URETERAL STENT PLACEMENT/BLADDER BIOPSY;  Surgeon: [name], MD;  Location: [facility];  Service: Urology;  Laterality: Right;  . CYSTOSCOPY W/ URETERAL STENT PLACEMENT Right 09/12/2015   Procedure: CYSTOSCOPY WITH STENT REPLACEMENT;  Surgeon: [name], MD;  Location: [facility];  Service: Urology;  Laterality: Right;  . CYSTOSCOPY WITH BIOPSY Right 09/12/2015   Procedure: CYSTOSCOPY WITH BLADDER AND URETERAL BIOPSY;  Surgeon: [name], MD;  Location: [facility];  Service: Urology;  Laterality: Right;  . CYSTOSCOPY WITH STENT PLACEMENT Right 01/30/2015   Procedure: CYSTOSCOPY WITH STENT PLACEMENT;  Surgeon: [name], MD;  Location: [facility];  Service: Urology;  Laterality: Right;  . CYSTOSCOPY WITH STENT PLACEMENT Right 12/28/2017   Procedure: [place] WITH STENT Exchange;  Surgeon: [name], MD;  Location: [facility];  Service: Urology;  Laterality: Right;  . CYSTOSCOPY/URETEROSCOPY/HOLMIUM LASER/STENT PLACEMENT Right 08/19/2017   Procedure: CYSTOSCOPY/URETEROSCOPY/HOLMIUM LASER/STENT PLACEMENT;  Surgeon: [name], MD;  Location: [facility];  Service: Urology;  Laterality: Right;  . EYE SURGERY Bilateral    Cataract Extraction with IOL  . goiter removal    . HOLMIUM LASER APPLICATION N/A 0/17/4944   Procedure:  HOLMIUM LASER APPLICATION;  Surgeon: [name], MD;  Location: [facility];  Service: Urology;  Laterality: N/A;  . PORTA CATH INSERTION N/A 09/23/2017   Procedure: PORTA CATH INSERTION;  Surgeon: Algernon Huxley, MD;  Location: [place] [facility];  Service: Cardiovascular;  Laterality: N/A;  . SPERMATOCELECTOMY    . TONSILLECTOMY    . TRANSURETHRAL RESECTION OF BLADDER TUMOR N/A 02/26/2016   Procedure: TRANSURETHRAL RESECTION OF BLADDER TUMOR (TURBT);  Surgeon: [name], MD;  Location: [facility];  Service: Urology;  Laterality: N/A;  . TRANSURETHRAL RESECTION OF BLADDER TUMOR WITH MITOMYCIN-C N/A 09/12/2015   Procedure: TRANSURETHRAL RESECTION OF BLADDER TUMOR ;  Surgeon: [name], MD;  Location: [facility];  Service: Urology;  Laterality: N/A;  . TRANSURETHRAL RESECTION OF BLADDER TUMOR WITH MITOMYCIN-C N/A 03/24/2016   Procedure: TRANSURETHRAL RESECTION OF BLADDER TUMOR WITH MITOMYCIN-C  (SMALL);  Surgeon: [name], MD;  Location: [facility];  Service: Urology;  Laterality: N/A;  . URETERAL BIOPSY Right 08/19/2017   Procedure: Renal Mass BIOPSY;  Surgeon: [name], MD;   Location: [facility];  Service: Urology;  Laterality: Right;  . URETEROSCOPY Right 01/30/2015   Procedure: URETEROSCOPY/ WITH BIOPSY AND CYTOLOGY BRUSHING;  Surgeon: [name], MD;  Location: [facility];  Service: Urology;  Laterality: Right;  . URETEROSCOPY Right 08/20/2015   Procedure: URETEROSCOPY;  Surgeon: [name], MD;  Location: [facility];  Service: Urology;  Laterality: Right;  . URETEROSCOPY Right 09/12/2015   Procedure: URETEROSCOPY;  Surgeon: [name], MD;  Location: [facility];  Service: Urology;  Laterality: Right;  . URETEROSCOPY Right 02/26/2016   Procedure: URETEROSCOPY;  Surgeon: [name], MD;  Location: [facility];  Service: Urology;  Laterality: Right;    Social History   Socioeconomic History  . Marital status: Married    Spouse name: Not on file  . Number of children: Not on file  . Years of education: Not on file  . Highest education level: Not on file  Occupational History  . Not on file  Social Needs  . Financial resource strain: Not on file  . Food insecurity:    Worry: Not on file    Inability: Not on file  . Transportation needs:    Medical: Not on file    Non-medical: Not on file  Tobacco Use  . Smoking status: Never Smoker  . Smokeless tobacco: Never Used  Substance and Sexual Activity  . Alcohol use: No    Alcohol/week: 0.0 standard drinks  . Drug use: No  . Sexual activity: Not on file  Lifestyle  . Physical activity:    Days per week: Not on file    Minutes per session: Not on file  . Stress: Not on file  Relationships  . Social connections:    Talks on phone: Not on file    Gets together: Not on file    Attends religious service: Not on file    Active member of club or organization: Not on file    Attends meetings of clubs or organizations: Not on file    Relationship status: Not on file  . Intimate partner violence:    Fear of current or ex partner: Not on file    Emotionally abused: Not on file    Physically abused:  Not on file    Forced sexual activity: Not on file  Other Topics Concern  . Not on file  Social History Narrative  . Not on file    Family History  Problem Relation Age of Onset  . Hypertension Mother   . Hypertension Father   . Prostate cancer Brother   . Kidney disease Neg Hx   . Kidney cancer Neg Hx   . Bladder Cancer Neg Hx      Current Outpatient Medications:  .  benazepril-hydrochlorthiazide (LOTENSIN HCT) 20-12.5 MG tablet, Take 1 tablet by mouth daily. , Disp: , Rfl:  .  dexamethasone (DECADRON) 4 MG tablet, Take 2 tablets (8 mg total) by mouth daily. Start the day after carboplatin chemotherapy for 2 days., Disp: 30 tablet, Rfl: 1 .  diazepam (VALIUM) 2 MG tablet, Take 5 mg by mouth at bedtime. , Disp: , Rfl:  .  docusate sodium (COLACE) 100 MG capsule, Take 100 mg by mouth 2 (two) times daily. , Disp: , Rfl:  .  fenofibrate micronized (LOFIBRA) 134 MG capsule, Take 134 mg by mouth daily before breakfast. ,  Disp: , Rfl:  .  fluticasone (FLONASE) 50 MCG/ACT nasal spray, Place 2 sprays into both nostrils at bedtime., Disp: , Rfl:  .  Multiple Vitamin (MULTIVITAMIN WITH MINERALS) TABS tablet, Take 1 tablet by mouth daily., Disp: , Rfl:  .  Potassium 99 MG TABS, Take 99 mg by mouth at bedtime., Disp: , Rfl:  .  psyllium (METAMUCIL) 58.6 % packet, Take 1 packet by mouth daily. , Disp: , Rfl:  .  tamsulosin (FLOMAX) 0.4 MG CAPS capsule, Take 1 capsule (0.4 mg total) by mouth daily. (Patient taking differently: Take 0.4 mg by mouth every evening. ), Disp: 90 capsule, Rfl: 3 .  acetaminophen (TYLENOL) 500 MG tablet, Take 1,000 mg by mouth daily as needed for moderate pain. , Disp: , Rfl:  .  HYDROcodone-acetaminophen (NORCO/VICODIN) 5-325 MG tablet, Take 1 tablet by mouth every 6 (six) hours as needed for severe pain., Disp: , Rfl:  .  hydrocortisone cream 1 %, Apply 1 application topically daily as needed for itching., Disp: , Rfl:  .  lidocaine-prilocaine (EMLA) cream, Apply to  affected area once (Patient not taking: Reported on 12/21/2017), Disp: 30 g, Rfl: 3 .  LORazepam (ATIVAN) 0.5 MG tablet, Take 1 tablet (0.5 mg total) by mouth every 6 (six) hours as needed (Nausea or vomiting). (Patient not taking: Reported on 12/23/2017), Disp: 30 tablet, Rfl: 0 .  ondansetron (ZOFRAN) 8 MG tablet, Take 1 tablet (8 mg total) by mouth 2 (two) times daily as needed for refractory nausea / vomiting. Start on day 3 after carboplatin chemo. (Patient not taking: Reported on 12/29/2017), Disp: 30 tablet, Rfl: 1 .  prochlorperazine (COMPAZINE) 10 MG tablet, Take 1 tablet (10 mg total) by mouth every 6 (six) hours as needed (Nausea or vomiting). (Patient not taking: Reported on 12/29/2017), Disp: 30 tablet, Rfl: 1 No current facility-administered medications for this visit.   Facility-Administered Medications Ordered in Other Visits:  .  heparin lock flush 100 unit/mL, 500 Units, Intravenous, Once, Sindy Guadeloupe, MD .  sodium chloride flush (NS) 0.9 % injection 10 mL, 10 mL, Intravenous, PRN, Sindy Guadeloupe, MD, 10 mL at 12/29/17 0903  Physical exam:  Vitals:   12/29/17 0917  BP: 131/78  Pulse: 76  Resp: 18  Temp: (!) 97.4 F (36.3 C)  SpO2: 96%  Weight: 197 lb 11.2 oz (89.7 kg)  Height: 6' (1.829 m)   Physical Exam  Constitutional: He is oriented to person, place, and time. He appears well-developed and well-nourished.  HENT:  Head: Normocephalic and atraumatic.  Eyes: Pupils are equal, round, and reactive to light. EOM are normal.  Neck: Normal range of motion.  Cardiovascular: Normal rate, regular rhythm and normal heart sounds.  Pulmonary/Chest: Effort normal and breath sounds normal.  Abdominal: Soft. Bowel sounds are normal.  Neurological: He is alert and oriented to person, place, and time.  Skin: Skin is warm and dry.     CMP Latest Ref Rng & Units 12/15/2017  Glucose 70 - 99 mg/dL 159(H)  BUN 8 - 23 mg/dL 18  Creatinine 0.61 - 1.24 mg/dL 1.23  Sodium 135 - 145  mmol/L 134(L)  Potassium 3.5 - 5.1 mmol/L 3.7  Chloride 98 - 111 mmol/L 100  CO2 22 - 32 mmol/L 26  Calcium 8.9 - 10.3 mg/dL 9.1  Total Protein 6.5 - 8.1 g/dL -  Total Bilirubin 0.3 - 1.2 mg/dL -  Alkaline Phos 38 - 126 U/L -  AST 15 - 41 U/L -  ALT  0 - 44 U/L -   CBC Latest Ref Rng & Units 12/29/2017  WBC 4.0 - 10.5 K/uL 5.2  Hemoglobin 13.0 - 17.0 g/dL 10.3(L)  Hematocrit 39.0 - 52.0 % 29.9(L)  Platelets 150 - 400 K/uL 200    No images are attached to the encounter.  Ct Chest W Contrast  Result Date: 12/09/2017 CLINICAL DATA:  Restaging metastatic uroepithelial carcinoma. 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 CONTRAST:  39mL ISOVUE-300 IOPAMIDOL (ISOVUE-300) INJECTION 61% COMPARISON:  PET-CT 09/21/2017 and chest CT 09/04/2017 FINDINGS: CT CHEST FINDINGS Cardiovascular: The heart is normal in size. No pericardial effusion. Stable mild scattered atherosclerotic calcifications involving the thoracic aorta but no dissection or aneurysm. The branch vessels are patent. Scattered coronary artery calcifications. Mediastinum/Nodes: No mediastinal or hilar mass or lymphadenopathy. Small scattered lymph nodes are stable. The esophagus is grossly normal. Lungs/Pleura: Smoothly marginated and slightly lobular right upper lobe lung lesion measures 13.5 x 10.5 mm on image number 75 previously measured 14.5 x 11 mm. 2.5 mm left lower lobe nodule on image number 84 is unchanged since the CT scan of 09/04/2017. Stable 3 mm nodule in the left upper lobe on image number 64. No other pulmonary lesions are identified. No acute pulmonary findings. Musculoskeletal: No significant bony findings. No worrisome bone lesions. CT ABDOMEN PELVIS FINDINGS Hepatobiliary: No focal hepatic  lesions to suggest metastatic disease. Gallstones are noted the gallbladder. No common bile duct dilatation. Pancreas: No mass, inflammation or ductal dilatation. There is a small stable cyst noted in the lower pancreatic head no change when compared to the prior CT scan from 08/06/2017. It measures 11 x 7 mm. Spleen: Normal size.  No focal lesions. Adrenals/Urinary Tract: The adrenal glands are unremarkable and stable. Stable of right renal cyst. There is a double-J ureteral stent noted on the right side. There is high attenuation material in the right renal pelvis. This appears to be enhancing and is worrisome for transitional cell neoplasm. It appears of progressive when compared to the prior CT abdomen from 08/06/2017. The left kidney, left ureter and bladder are unremarkable. Stomach/Bowel: The stomach, duodenum, small bowel and colon are grossly normal. Sigmoid diverticulosis without findings for acute diverticulitis. No inflammatory changes, mass lesions or obstructive findings. The terminal ileum and appendix are normal. Vascular/Lymphatic: Stable atherosclerotic calcifications involving the distal aorta and iliac arteries. No aneurysm or dissection. The major venous structures are patent. No mesenteric or retroperitoneal mass or lymphadenopathy. Small scattered lymph nodes are stable. 11.5 mm right obturator lymph node on image number 116 previously measured 16.5 mm. No new pelvic lymph nodes. No inguinal adenopathy. Reproductive: The prostate gland and seminal vesicles are unremarkable. Other: No inguinal mass or hernia. Musculoskeletal: No significant bony findings. No lytic or destructive bone lesions. IMPRESSION: 1. The right upper lobe pulmonary nodule is slightly smaller. Other pulmonary nodules are stable. No new/progressive findings in the chest. 2. Right-sided double-J ureteral stent. No obvious distal right ureteral tumor. However, there is progressive appearing tumor in the right renal pelvis.  Retrograde study may be helpful for further evaluation. 3. Interval decrease in size of the right obturator lymph node. No new/progressive adenopathy. Electronically Signed   By: Marijo Sanes M.D.   On: 12/09/2017 16:26   Ct Abdomen Pelvis W Contrast  Result Date: 12/09/2017 CLINICAL DATA:  Restaging metastatic uroepithelial carcinoma. 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 CONTRAST:  22mL ISOVUE-300 IOPAMIDOL (ISOVUE-300) INJECTION 61% COMPARISON:  PET-CT 09/21/2017 and chest CT 09/04/2017 FINDINGS: CT CHEST FINDINGS Cardiovascular: The heart is normal in size. No pericardial effusion. Stable mild scattered atherosclerotic calcifications involving the thoracic aorta but no dissection or aneurysm. The branch vessels are patent. Scattered coronary artery calcifications. Mediastinum/Nodes: No mediastinal or hilar mass or lymphadenopathy. Small scattered lymph nodes are stable. The esophagus is grossly normal. Lungs/Pleura: Smoothly marginated and slightly lobular right upper lobe lung lesion measures 13.5 x 10.5 mm on image number 75 previously measured 14.5 x 11 mm. 2.5 mm left lower lobe nodule on image number 84 is unchanged since the CT scan of 09/04/2017. Stable 3 mm nodule in the left upper lobe on image number 64. No other pulmonary lesions are identified. No acute pulmonary findings. Musculoskeletal: No significant bony findings. No worrisome bone lesions. CT ABDOMEN PELVIS FINDINGS Hepatobiliary: No focal hepatic lesions to suggest metastatic disease. Gallstones are noted the gallbladder. No common bile duct dilatation. Pancreas: No mass, inflammation or ductal dilatation. There is a small stable cyst noted in the lower pancreatic head no change when compared to the prior CT  scan from 08/06/2017. It measures 11 x 7 mm. Spleen: Normal size.  No focal lesions. Adrenals/Urinary Tract: The adrenal glands are unremarkable and stable. Stable of right renal cyst. There is a double-J ureteral stent noted on the right side. There is high attenuation material in the right renal pelvis. This appears to be enhancing and is worrisome for transitional cell neoplasm. It appears of progressive when compared to the prior CT abdomen from 08/06/2017. The left kidney, left ureter and bladder are unremarkable. Stomach/Bowel: The stomach, duodenum, small bowel and colon are grossly normal. Sigmoid diverticulosis without findings for acute diverticulitis. No inflammatory changes, mass lesions or obstructive findings. The terminal ileum and appendix are normal. Vascular/Lymphatic: Stable atherosclerotic calcifications involving the distal aorta and iliac arteries. No aneurysm or dissection. The major venous structures are patent. No mesenteric or retroperitoneal mass or lymphadenopathy. Small scattered lymph nodes are stable. 11.5 mm right obturator lymph node on image number 116 previously measured 16.5 mm. No new pelvic lymph nodes. No inguinal adenopathy. Reproductive: The prostate gland and seminal vesicles are unremarkable. Other: No inguinal mass or hernia. Musculoskeletal: No significant bony findings. No lytic or destructive bone lesions. IMPRESSION: 1. The right upper lobe pulmonary nodule is slightly smaller. Other pulmonary nodules are stable. No new/progressive findings in the chest. 2. Right-sided double-J ureteral stent. No obvious distal right ureteral tumor. However, there is progressive appearing tumor in the right renal pelvis. Retrograde study may be helpful for further evaluation. 3. Interval decrease in size of the right obturator lymph node. No new/progressive adenopathy. Electronically Signed   By: Marijo Sanes M.D.   On: 12/09/2017 16:26     Assessment and plan- Patient is a 84  y.o. 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  He is here for on treatment assessment prior to cycle 4-day 15 of carboplatin and gemcitabine chemotherapy  Counts are okay to proceed with cycle 4-day 15 of carboplatin and gemcitabine today.  We have been doing  chemotherapy every other week as he is unable to tolerate 2 weeks on 1 week off without significant neutropenia.  I also discussed his case at the tumor board last week and there is overall decrease in the size of his lung nodule as well as obturator lymph node.  We again reviewed his images to compare the size of the primary renal pelvic mass which is appearing more or less the same.  He is not symptomatic and does not have any pain or hematuria.  He therefore does not require any palliative radiation for the primary mass at this time.  He does have some chemo induced anemia but his hemoglobin is overall stable around 10.  Plan at this time is to complete 6 cycles of carboplatin and gemcitabine followed by repeat scans and if he were to have stable disease or partial response he will get surveillance imaging every 3 months.  Patient and his wife are in understanding of the plan   Visit Diagnosis 1. Encounter for antineoplastic chemotherapy   2. 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 Antineoplastic chemotherapy induced anemia      Dr. Randa Evens, MD, MPH [facility] [phone] 12/29/2017 9:20 AM

## 2017-12-29 NOTE — Progress Notes (Signed)
No new changes noted today 

## 2018-01-12 ENCOUNTER — Inpatient Hospital Stay: Payer: Medicare HMO | Attending: Oncology

## 2018-01-12 ENCOUNTER — Encounter: Payer: Self-pay | Admitting: Oncology

## 2018-01-12 ENCOUNTER — Inpatient Hospital Stay (HOSPITAL_BASED_OUTPATIENT_CLINIC_OR_DEPARTMENT_OTHER): Payer: Medicare HMO | Admitting: Oncology

## 2018-01-12 ENCOUNTER — Inpatient Hospital Stay: Payer: Medicare HMO

## 2018-01-12 VITALS — BP 126/77 | HR 80 | Temp 97.4°F | Resp 18 | Ht 72.0 in | Wt 196.7 lb

## 2018-01-12 DIAGNOSIS — Z5111 Encounter for antineoplastic chemotherapy: Secondary | ICD-10-CM | POA: Insufficient documentation

## 2018-01-12 DIAGNOSIS — R530 Neoplastic (malignant) related fatigue: Secondary | ICD-10-CM | POA: Diagnosis not present

## 2018-01-12 DIAGNOSIS — Z8551 Personal history of malignant neoplasm of bladder: Secondary | ICD-10-CM | POA: Diagnosis not present

## 2018-01-12 DIAGNOSIS — R319 Hematuria, unspecified: Secondary | ICD-10-CM | POA: Insufficient documentation

## 2018-01-12 DIAGNOSIS — Z85828 Personal history of other malignant neoplasm of skin: Secondary | ICD-10-CM | POA: Insufficient documentation

## 2018-01-12 DIAGNOSIS — D701 Agranulocytosis secondary to cancer chemotherapy: Secondary | ICD-10-CM | POA: Diagnosis not present

## 2018-01-12 DIAGNOSIS — Z7689 Persons encountering health services in other specified circumstances: Secondary | ICD-10-CM | POA: Diagnosis not present

## 2018-01-12 DIAGNOSIS — C7801 Secondary malignant neoplasm of right lung: Secondary | ICD-10-CM | POA: Diagnosis not present

## 2018-01-12 DIAGNOSIS — Z79899 Other long term (current) drug therapy: Secondary | ICD-10-CM | POA: Diagnosis not present

## 2018-01-12 DIAGNOSIS — D6481 Anemia due to antineoplastic chemotherapy: Secondary | ICD-10-CM | POA: Diagnosis not present

## 2018-01-12 DIAGNOSIS — I10 Essential (primary) hypertension: Secondary | ICD-10-CM | POA: Insufficient documentation

## 2018-01-12 DIAGNOSIS — Z8249 Family history of ischemic heart disease and other diseases of the circulatory system: Secondary | ICD-10-CM | POA: Diagnosis not present

## 2018-01-12 DIAGNOSIS — T451X5A Adverse effect of antineoplastic and immunosuppressive drugs, initial encounter: Secondary | ICD-10-CM

## 2018-01-12 DIAGNOSIS — C669 Malignant neoplasm of unspecified ureter: Secondary | ICD-10-CM

## 2018-01-12 DIAGNOSIS — C661 Malignant neoplasm of right ureter: Secondary | ICD-10-CM | POA: Diagnosis not present

## 2018-01-12 LAB — CBC WITH DIFFERENTIAL/PLATELET
Abs Immature Granulocytes: 0.02 10*3/uL (ref 0.00–0.07)
BASOS ABS: 0 10*3/uL (ref 0.0–0.1)
Basophils Relative: 1 %
EOS ABS: 0.1 10*3/uL (ref 0.0–0.5)
Eosinophils Relative: 3 %
HEMATOCRIT: 29.2 % — AB (ref 39.0–52.0)
HEMOGLOBIN: 10.1 g/dL — AB (ref 13.0–17.0)
IMMATURE GRANULOCYTES: 1 %
LYMPHS ABS: 0.7 10*3/uL (ref 0.7–4.0)
LYMPHS PCT: 23 %
MCH: 31.7 pg (ref 26.0–34.0)
MCHC: 34.6 g/dL (ref 30.0–36.0)
MCV: 91.5 fL (ref 80.0–100.0)
MONOS PCT: 18 %
Monocytes Absolute: 0.6 10*3/uL (ref 0.1–1.0)
NEUTROS PCT: 54 %
Neutro Abs: 1.7 10*3/uL (ref 1.7–7.7)
Platelets: 209 10*3/uL (ref 150–400)
RBC: 3.19 MIL/uL — ABNORMAL LOW (ref 4.22–5.81)
RDW: 15.1 % (ref 11.5–15.5)
WBC: 3.1 10*3/uL — ABNORMAL LOW (ref 4.0–10.5)
nRBC: 0 % (ref 0.0–0.2)

## 2018-01-12 LAB — BASIC METABOLIC PANEL
Anion gap: 7 (ref 5–15)
BUN: 15 mg/dL (ref 8–23)
CALCIUM: 9 mg/dL (ref 8.9–10.3)
CHLORIDE: 98 mmol/L (ref 98–111)
CO2: 28 mmol/L (ref 22–32)
CREATININE: 1.06 mg/dL (ref 0.61–1.24)
GFR calc non Af Amer: >60 mL/min (ref >60)
Glucose, Bld: 116 mg/dL — ABNORMAL HIGH (ref 70–99)
Potassium: 4.1 mmol/L (ref 3.5–5.1)
Sodium: 133 mmol/L — ABNORMAL LOW (ref 135–145)

## 2018-01-12 MED ORDER — DEXAMETHASONE SODIUM PHOSPHATE 10 MG/ML IJ SOLN
10.0000 mg | Freq: Once | INTRAMUSCULAR | Status: AC
  Administered 2018-01-12: 10 mg via INTRAVENOUS
  Filled 2018-01-12: qty 1

## 2018-01-12 MED ORDER — SODIUM CHLORIDE 0.9 % IV SOLN: 10.0000 mg | Freq: Once | INTRAVENOUS | Status: DC

## 2018-01-12 MED ORDER — DEXAMETHASONE SODIUM PHOSPHATE 10 MG/ML IJ SOLN: 10.0000 mg | Freq: Once | INTRAMUSCULAR | Status: DC

## 2018-01-12 MED ORDER — PALONOSETRON HCL INJECTION 0.25 MG/5ML
0.2500 mg | Freq: Once | INTRAVENOUS | Status: AC
  Administered 2018-01-12: 0.25 mg via INTRAVENOUS
  Filled 2018-01-12: qty 5

## 2018-01-12 MED ORDER — SODIUM CHLORIDE 0.9% FLUSH
10.0000 mL | Freq: Once | INTRAVENOUS | Status: AC
  Administered 2018-01-12: 10 mL via INTRAVENOUS
  Filled 2018-01-12: qty 10

## 2018-01-12 MED ORDER — SODIUM CHLORIDE 0.9 % IV SOLN
1600.0000 mg | Freq: Once | INTRAVENOUS | Status: AC
  Administered 2018-01-12: 1600 mg via INTRAVENOUS
  Filled 2018-01-12: qty 26.3

## 2018-01-12 MED ORDER — PALONOSETRON HCL INJECTION 0.25 MG/5ML: 0.2500 mg | Freq: Once | INTRAVENOUS | Status: AC

## 2018-01-12 MED ORDER — SODIUM CHLORIDE 0.9 % IV SOLN
178.2000 mg | Freq: Once | INTRAVENOUS | Status: AC
  Administered 2018-01-12: 180 mg via INTRAVENOUS
  Filled 2018-01-12: qty 18

## 2018-01-12 MED ORDER — SODIUM CHLORIDE 0.9 % IV SOLN
Freq: Once | INTRAVENOUS | Status: AC
  Administered 2018-01-12: 10:00:00 via INTRAVENOUS
  Filled 2018-01-12: qty 250

## 2018-01-12 MED ORDER — HEPARIN SOD (PORK) LOCK FLUSH 100 UNIT/ML IV SOLN
500.0000 [IU] | Freq: Once | INTRAVENOUS | Status: AC
  Administered 2018-01-12: 500 [IU] via INTRAVENOUS
  Filled 2018-01-12: qty 5

## 2018-01-12 NOTE — Progress Notes (Signed)
Penis pain at times

## 2018-01-12 NOTE — Progress Notes (Signed)
Hematology/Oncology Consult note [facility]  [phone] [phone]  Patient Care Team: Idelle Crouch, MD as PCP - General (Internal Medicine)   Name of the patient: Anthony Gonzales  [id number]  September 01, 1931   Date of visit: 01/12/18  Diagnosis- Metastatic urothelial carcinoma with possible mets to the obturator node. Indeterminate hilar lymph nodes and LUL lung nodule   Chief complaint/ Reason for visit-on treatment assessment prior to cycle 5-day 1 of carboplatin and gemcitabine  Heme/Onc history: patient is a 84 year old male with past medical history significant for hypertension and long-standing history of superficial bladder cancer for which he sees [name]. He has undergone TURBT as well as ureteroscopy since 2017 along with mitomycin as well in the past. Most recently he underwent CT abdomen on 08/06/2017 which showed a soft tissue mass in the right renal pelvis concerning for upper tract urothelial neoplasm. Soft tissue fullness at the right ureterovesical junction with proximal right hydroureteronephrosis. Several millimeter attenuation lesion in the pancreatic head.  He underwent diagnostic ureteroscopy and was found to have 2 high-grade lesions within his right kidney. There was a nodular high-grade appearing lesion in the right anterior renal pelvis. There was also a second ureteral tumor fungating from the right ureteral orifice extending into the distal ureter also consistent with high-grade invasive urothelial carcinoma. Muscle invasion could not be assessed. He also underwent a CT chest which showed a rounded nodule in the right upper lobe measuring 14 mm concerning for metastases. 2 other 3 mm lesions were also noted in the left upper lobe likely benign  Plan initially was neoadjuvant chemotherapy followed by possible surgery but given the presence of lung lesion patient has been referred to oncology for the  same.  PET/CT on 09/21/17 showed:IMPRESSION: 1. Hypermetabolic right obturator lymph node is most indicative of metastatic disease. 2. Mildly hypermetabolic left hilar lymph nodes are nonspecific. Continued attention on follow-up exams is warranted. 3. Right upper lobe pulmonary nodule shows metabolism after just above blood pool and is therefore indeterminate. Continued attention on follow-up exams is warranted. 4. Aortic atherosclerosis (ICD10-170.0). Coronary artery calcification. 5. Cholelithiasis   Plan is to proceed with 4-6 cycles of carboplatin/ gemzar1 week on and one-week off as patient could not tolerate 2-week on and one week off regimen. Cycle 1 started on 09/22/2017  Interval history-patient had his stent replaced by urology about 2 weeks ago.  He has been having intermittent bleeding during urination since the last 4 days.  He states that he got in touch with urology and they have recommended pain medicines for the same.  Reports mild chronic fatigue.  Denies other complaints  ECOG PS- 1 Pain scale- 0 Opioid associated constipation- no  Review of systems- Review of Systems  Constitutional: Positive for malaise/fatigue. Negative for chills, fever and weight loss.  HENT: Negative for congestion, ear discharge and nosebleeds.   Eyes: Negative for blurred vision.  Respiratory: Negative for cough, hemoptysis, sputum production, shortness of breath and wheezing.   Cardiovascular: Negative for chest pain, palpitations, orthopnea and claudication.  Gastrointestinal: Negative for abdominal pain, blood in stool, constipation, diarrhea, heartburn, melena, nausea and vomiting.  Genitourinary: Positive for hematuria. Negative for dysuria, flank pain, frequency and urgency.  Musculoskeletal: Negative for back pain, joint pain and myalgias.  Skin: Negative for rash.  Neurological: Negative for dizziness, tingling, focal weakness, seizures, weakness and headaches.    Endo/Heme/Allergies: Does not bruise/bleed easily.  Psychiatric/Behavioral: Negative for depression and suicidal ideas. The patient  does not have insomnia.       Allergies  Allergen Reactions  . Demerol [Meperidine] Nausea And Vomiting  . Lipitor [Atorvastatin] Swelling  . Sulfa Antibiotics Nausea And Vomiting and Rash     Past Medical History:  Diagnosis Date  . Arthritis   . Benign fibroma of prostate 08/23/2013  . BPH (benign prostatic hyperplasia)   . Calculus of kidney 08/23/2013  . Glaucoma    no drops in 3 mo pressure good, pt denies glaucoma, eye pressure has been measuring alright.  . History of kidney stones   . HLD (hyperlipidemia)   . HOH (hard of hearing)    Left Hearing Aid  . HTN (hypertension) 12/26/2014  . Hypertension   . Hyponatremia 12/26/2014  . Migraines    history of migraines when he was younger.  [name] Restless leg syndrome   . Sinus drainage   . Skin cancer   . Urothelial cancer ([place])    chemo tx's.  [name] UTI (lower urinary tract infection) 12/26/2014  . Vertigo      Past Surgical History:  Procedure Laterality Date  . COLONOSCOPY    . CYSTOSCOPY W/ RETROGRADES Right 01/30/2015   Procedure: CYSTOSCOPY WITH RETROGRADE PYELOGRAM;  Surgeon: [name], MD;  Location: [facility];  Service: Urology;  Laterality: Right;  . CYSTOSCOPY W/ RETROGRADES Bilateral 02/26/2016   Procedure: CYSTOSCOPY WITH RETROGRADE PYELOGRAM;  Surgeon: [name], MD;  Location: [facility];  Service: Urology;  Laterality: Bilateral;  . CYSTOSCOPY W/ URETERAL STENT PLACEMENT Right 08/20/2015   Procedure: CYSTOSCOPY WITH RETROGRADE PYELOGRAM/POSSIBLE URETERAL STENT PLACEMENT/BLADDER BIOPSY;  Surgeon: [name], MD;  Location: [facility];  Service: Urology;  Laterality: Right;  . CYSTOSCOPY W/ URETERAL STENT PLACEMENT Right 09/12/2015   Procedure: CYSTOSCOPY WITH STENT REPLACEMENT;  Surgeon: [name], MD;  Location: [facility];  Service: Urology;  Laterality: Right;  .  CYSTOSCOPY WITH BIOPSY Right 09/12/2015   Procedure: CYSTOSCOPY WITH BLADDER AND URETERAL BIOPSY;  Surgeon: [name], MD;  Location: [facility];  Service: Urology;  Laterality: Right;  . CYSTOSCOPY WITH STENT PLACEMENT Right 01/30/2015   Procedure: CYSTOSCOPY WITH STENT PLACEMENT;  Surgeon: [name], MD;  Location: [facility];  Service: Urology;  Laterality: Right;  . CYSTOSCOPY WITH STENT PLACEMENT Right 12/28/2017   Procedure: [place] WITH STENT Exchange;  Surgeon: [name], MD;  Location: [facility];  Service: Urology;  Laterality: Right;  . CYSTOSCOPY/URETEROSCOPY/HOLMIUM LASER/STENT PLACEMENT Right 08/19/2017   Procedure: CYSTOSCOPY/URETEROSCOPY/HOLMIUM LASER/STENT PLACEMENT;  Surgeon: [name], MD;  Location: [facility];  Service: Urology;  Laterality: Right;  . EYE SURGERY Bilateral    Cataract Extraction with IOL  . goiter removal    . HOLMIUM LASER APPLICATION N/A 8/46/9629   Procedure:  HOLMIUM LASER APPLICATION;  Surgeon: [name], MD;  Location: [facility];  Service: Urology;  Laterality: N/A;  . PORTA CATH INSERTION N/A 09/23/2017   Procedure: PORTA CATH INSERTION;  Surgeon: Algernon Huxley, MD;  Location: [place] [facility];  Service: Cardiovascular;  Laterality: N/A;  . SPERMATOCELECTOMY    . TONSILLECTOMY    . TRANSURETHRAL RESECTION OF BLADDER TUMOR N/A 02/26/2016   Procedure: TRANSURETHRAL RESECTION OF BLADDER TUMOR (TURBT);  Surgeon: [name], MD;  Location: [facility];  Service: Urology;  Laterality: N/A;  . TRANSURETHRAL RESECTION OF BLADDER TUMOR WITH MITOMYCIN-C N/A 09/12/2015   Procedure: TRANSURETHRAL RESECTION OF BLADDER TUMOR ;  Surgeon: [name], MD;  Location: [facility];  Service: Urology;  Laterality: N/A;  . TRANSURETHRAL RESECTION OF  BLADDER TUMOR WITH MITOMYCIN-C N/A 03/24/2016   Procedure: TRANSURETHRAL RESECTION OF BLADDER TUMOR WITH MITOMYCIN-C  (SMALL);  Surgeon: [name], MD;  Location: [facility];  Service: Urology;   Laterality: N/A;  . URETERAL BIOPSY Right 08/19/2017   Procedure: Renal Mass BIOPSY;  Surgeon: [name], MD;  Location: [facility];  Service: Urology;  Laterality: Right;  . URETEROSCOPY Right 01/30/2015   Procedure: URETEROSCOPY/ WITH BIOPSY AND CYTOLOGY BRUSHING;  Surgeon: [name], MD;  Location: [facility];  Service: Urology;  Laterality: Right;  . URETEROSCOPY Right 08/20/2015   Procedure: URETEROSCOPY;  Surgeon: [name], MD;  Location: [facility];  Service: Urology;  Laterality: Right;  . URETEROSCOPY Right 09/12/2015   Procedure: URETEROSCOPY;  Surgeon: [name], MD;  Location: [facility];  Service: Urology;  Laterality: Right;  . URETEROSCOPY Right 02/26/2016   Procedure: URETEROSCOPY;  Surgeon: [name], MD;  Location: [facility];  Service: Urology;  Laterality: Right;    Social History   Socioeconomic History  . Marital status: Married    Spouse name: Not on file  . Number of children: Not on file  . Years of education: Not on file  . Highest education level: Not on file  Occupational History  . Not on file  Social Needs  . Financial resource strain: Not on file  . Food insecurity:    Worry: Not on file    Inability: Not on file  . Transportation needs:    Medical: Not on file    Non-medical: Not on file  Tobacco Use  . Smoking status: Never Smoker  . Smokeless tobacco: Never Used  Substance and Sexual Activity  . Alcohol use: No    Alcohol/week: 0.0 standard drinks  . Drug use: No  . Sexual activity: Not on file  Lifestyle  . Physical activity:    Days per week: Not on file    Minutes per session: Not on file  . Stress: Not on file  Relationships  . Social connections:    Talks on phone: Not on file    Gets together: Not on file    Attends religious service: Not on file    Active member of club or organization: Not on file    Attends meetings of clubs or organizations: Not on file    Relationship status: Not on file  . Intimate partner  violence:    Fear of current or ex partner: Not on file    Emotionally abused: Not on file    Physically abused: Not on file    Forced sexual activity: Not on file  Other Topics Concern  . Not on file  Social History Narrative  . Not on file    Family History  Problem Relation Age of Onset  . Hypertension Mother   . Hypertension Father   . Prostate cancer Brother   . Kidney disease Neg Hx   . Kidney cancer Neg Hx   . Bladder Cancer Neg Hx      Current Outpatient Medications:  .  benazepril-hydrochlorthiazide (LOTENSIN HCT) 20-12.5 MG tablet, Take 1 tablet by mouth daily. , Disp: , Rfl:  .  diazepam (VALIUM) 2 MG tablet, Take 5 mg by mouth at bedtime. , Disp: , Rfl:  .  docusate sodium (COLACE) 100 MG capsule, Take 100 mg by mouth 2 (two) times daily. , Disp: , Rfl:  .  fenofibrate micronized (LOFIBRA) 134 MG capsule, Take 134 mg by mouth daily before breakfast. , Disp: , Rfl:  .  fluticasone (FLONASE) 50 MCG/ACT nasal spray, Place 2 sprays into both nostrils at bedtime., Disp: , Rfl:  .  HYDROcodone-acetaminophen (NORCO/VICODIN) 5-325 MG tablet, Take 1 tablet by mouth every 6 (six) hours as needed for severe pain., Disp: , Rfl:  .  Multiple Vitamin (MULTIVITAMIN WITH MINERALS) TABS tablet, Take 1 tablet by mouth daily., Disp: , Rfl:  .  Potassium 99 MG TABS, Take 99 mg by mouth at bedtime., Disp: , Rfl:  .  psyllium (METAMUCIL) 58.6 % packet, Take 1 packet by mouth daily. , Disp: , Rfl:  .  tamsulosin (FLOMAX) 0.4 MG CAPS capsule, Take 1 capsule (0.4 mg total) by mouth daily. (Patient taking differently: Take 0.4 mg by mouth every evening. ), Disp: 90 capsule, Rfl: 3 .  acetaminophen (TYLENOL) 500 MG tablet, Take 1,000 mg by mouth daily as needed for moderate pain. , Disp: , Rfl:  .  dexamethasone (DECADRON) 4 MG tablet, Take 2 tablets (8 mg total) by mouth daily. Start the day after carboplatin chemotherapy for 2 days. (Patient not taking: Reported on 01/12/2018), Disp: 30 tablet,  Rfl: 1 .  hydrocortisone cream 1 %, Apply 1 application topically daily as needed for itching., Disp: , Rfl:  .  lidocaine-prilocaine (EMLA) cream, Apply to affected area once (Patient not taking: Reported on 12/21/2017), Disp: 30 g, Rfl: 3 .  LORazepam (ATIVAN) 0.5 MG tablet, Take 1 tablet (0.5 mg total) by mouth every 6 (six) hours as needed (Nausea or vomiting). (Patient not taking: Reported on 12/23/2017), Disp: 30 tablet, Rfl: 0 .  ondansetron (ZOFRAN) 8 MG tablet, Take 1 tablet (8 mg total) by mouth 2 (two) times daily as needed for refractory nausea / vomiting. Start on day 3 after carboplatin chemo. (Patient not taking: Reported on 12/29/2017), Disp: 30 tablet, Rfl: 1 .  prochlorperazine (COMPAZINE) 10 MG tablet, Take 1 tablet (10 mg total) by mouth every 6 (six) hours as needed (Nausea or vomiting). (Patient not taking: Reported on 12/29/2017), Disp: 30 tablet, Rfl: 1  Physical exam:  Vitals:   01/12/18 0909  BP: 126/77  Pulse: 80  Resp: 18  Temp: (!) 97.4 F (36.3 C)  TempSrc: Tympanic  SpO2: 97%  Weight: 196 lb 11.2 oz (89.2 kg)  Height: 6' (1.829 m)   Physical Exam  Constitutional: He is oriented to person, place, and time. He appears well-developed and well-nourished.  HENT:  Head: Normocephalic and atraumatic.  Eyes: Pupils are equal, round, and reactive to light. EOM are normal.  Neck: Normal range of motion.  Cardiovascular: Normal rate, regular rhythm and normal heart sounds.  Pulmonary/Chest: Effort normal and breath sounds normal.  Abdominal: Soft. Bowel sounds are normal.  Neurological: He is alert and oriented to person, place, and time.  Skin: Skin is warm and dry.     CMP Latest Ref Rng & Units 01/12/2018  Glucose 70 - 99 mg/dL 116(H)  BUN 8 - 23 mg/dL 15  Creatinine 0.61 - 1.24 mg/dL 1.06  Sodium 135 - 145 mmol/L 133(L)  Potassium 3.5 - 5.1 mmol/L 4.1  Chloride 98 - 111 mmol/L 98  CO2 22 - 32 mmol/L 28  Calcium 8.9 - 10.3 mg/dL 9.0  Total Protein 6.5 -  8.1 g/dL -  Total Bilirubin 0.3 - 1.2 mg/dL -  Alkaline Phos 38 - 126 U/L -  AST 15 - 41 U/L -  ALT 0 - 44 U/L -   CBC Latest Ref Rng & Units 01/12/2018  WBC 4.0 - 10.5 K/uL 3.1(L)  Hemoglobin 13.0 - 17.0 g/dL 10.1(L)  Hematocrit 39.0 - 52.0 % 29.2(L)  Platelets 150 - 400 K/uL 209      Assessment and plan- Patient is a 84 y.o. 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   He is here for on treatment assessment prior to cycle 5-day 1 of gemcitabine and carboplatin chemotherapy  Overall he is tolerating the 1 week on 1 week off regimen well without any significant side effects.  His white count is 3.1 today with an ANC of 1.7.  I will proceed with treatment today and see if his insurance would approve Neulasta tomorrow and if not with the next treatment which would be in 2 weeks time.  I will see him back in 2 weeks with CBC and CMP prior to cycle 5-day 15 of chemotherapy.  Plan to get repeat scans after 6 cycles  With regards to bleeding after stent placement-he knows to touch base with urology if his symptoms get worse.  He denies any UTI symptoms today such as fever or burning micturition.  I will hold off on getting a urinalysis today   Visit Diagnosis 1. 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 Chemotherapy induced neutropenia ([facility])   3. Encounter for antineoplastic chemotherapy      Dr. Randa Evens, MD, MPH [facility] at [facility] [phone] 01/12/2018 12:16 PM

## 2018-01-12 NOTE — Progress Notes (Signed)
Pt's ANC today is 1.7 per [place] Sousa Dr Janese Banks would like to proceed with treatment today

## 2018-01-13 ENCOUNTER — Telehealth: Payer: Self-pay

## 2018-01-13 ENCOUNTER — Other Ambulatory Visit: Payer: Self-pay | Admitting: Oncology

## 2018-01-13 ENCOUNTER — Inpatient Hospital Stay: Payer: Medicare HMO | Admitting: Oncology

## 2018-01-13 ENCOUNTER — Telehealth: Payer: Self-pay | Admitting: Urology

## 2018-01-13 DIAGNOSIS — Z5111 Encounter for antineoplastic chemotherapy: Secondary | ICD-10-CM | POA: Diagnosis not present

## 2018-01-13 DIAGNOSIS — C669 Malignant neoplasm of unspecified ureter: Secondary | ICD-10-CM

## 2018-01-13 MED ORDER — PEGFILGRASTIM-CBQV 6 MG/0.6ML ~~LOC~~ SOSY
6.0000 mg | PREFILLED_SYRINGE | Freq: Once | SUBCUTANEOUS | Status: AC
  Administered 2018-01-13: 6 mg via SUBCUTANEOUS

## 2018-01-13 NOTE — Telephone Encounter (Signed)
I have been trying to get in touch with the patient for about 2 hours and left several message the patient has not returned the phone call at this time i will keep trying durning the day to reach this patient.

## 2018-01-13 NOTE — Telephone Encounter (Signed)
Spoke with Mr Anthony Gonzales to inform him that his Ival Bible has been approved and Per Dr Janese Banks she would like for him to come in today and receive the injection. I have spoke with Jousha in the lab and has agreed to add the patient on at 2:30 PM. I have also spoken with the pharmacy staff and they were agreeable also. The patient was agreeable and understanding to come in today and get the injection.

## 2018-01-13 NOTE — Telephone Encounter (Signed)
Pt's penis started hurting like a toothache.  Pt had a fusion yesterday and passed water all day.  It started to be a struggle last night.  [phone]  He took a pain pill last night also.

## 2018-01-15 NOTE — Telephone Encounter (Signed)
Called pt, no answer. LM inquiring about current status of pt's pain. Advised pt to call back with update.

## 2018-01-18 ENCOUNTER — Ambulatory Visit (INDEPENDENT_AMBULATORY_CARE_PROVIDER_SITE_OTHER): Payer: Medicare HMO

## 2018-01-18 VITALS — BP 139/66 | HR 98 | Ht 72.0 in | Wt 196.0 lb

## 2018-01-18 DIAGNOSIS — R300 Dysuria: Secondary | ICD-10-CM

## 2018-01-18 LAB — URINALYSIS, COMPLETE
Bilirubin, UA: NEGATIVE
Glucose, UA: NEGATIVE
Ketones, UA: NEGATIVE
Nitrite, UA: NEGATIVE
Specific Gravity, UA: 1.015 (ref 1.005–1.030)
Urobilinogen, Ur: 1 mg/dL (ref 0.2–1.0)
pH, UA: 7 (ref 5.0–7.5)

## 2018-01-18 LAB — MICROSCOPIC EXAMINATION
BACTERIA UA: NONE SEEN
EPITHELIAL CELLS (NON RENAL): NONE SEEN /HPF (ref 0–10)

## 2018-01-18 NOTE — Progress Notes (Signed)
Patient present today complaining of urinary Frequency, urgency, dysuria, and blood in urine.  PVR was 151ml. Patient states that he started Myrbetriq 25mg  last week due to spasm pain going into tip of penis. He does not feel that the Myrbetriq helped with these symptoms. UA today is not grossly positive for infection but does show blood UA was sent for culture. Per Dr. Diamantina Providence patient was instructed to try AZO to help with burning and will check with [name] on follow up. Patient is undergoing chemo treatments at [facility] which may be the cause of symptoms.

## 2018-01-20 LAB — CULTURE, URINE COMPREHENSIVE

## 2018-01-25 NOTE — Progress Notes (Signed)
Hematology/Oncology Consult note [facility]  Telephone:([phone] [phone]  Patient Care Team: Idelle Crouch, MD as PCP - General (Internal Medicine)   Name of the patient: Anthony Gonzales  [id number]  12-May-1931   Date of visit: 01/25/18  Diagnosis- Metastatic urothelial carcinoma with possible mets to the obturator node. Indeterminate hilar lymph nodes and LUL lung nodule   Chief complaint/ Reason for visit-on treatment assessment prior to cycle 5-day 15 of carboplatin and gemcitabine  Heme/Onc history: patient is a 84 year old male with past medical history significant for hypertension and long-standing history of superficial bladder cancer for which he sees [name]. He has undergone TURBT as well as ureteroscopy since 2017 along with mitomycin as well in the past. Most recently he underwent CT abdomen on 08/06/2017 which showed a soft tissue mass in the right renal pelvis concerning for upper tract urothelial neoplasm. Soft tissue fullness at the right ureterovesical junction with proximal right hydroureteronephrosis. Several millimeter attenuation lesion in the pancreatic head.  He underwent diagnostic ureteroscopy and was found to have 2 high-grade lesions within his right kidney. There was a nodular high-grade appearing lesion in the right anterior renal pelvis. There was also a second ureteral tumor fungating from the right ureteral orifice extending into the distal ureter also consistent with high-grade invasive urothelial carcinoma. Muscle invasion could not be assessed. He also underwent a CT chest which showed a rounded nodule in the right upper lobe measuring 14 mm concerning for metastases. 2 other 3 mm lesions were also noted in the left upper lobe likely benign  Plan initially was neoadjuvant chemotherapy followed by possible surgery but given the presence of lung lesion patient has been referred to oncology for the  same.  PET/CT on 09/21/17 showed:IMPRESSION: 1. Hypermetabolic right obturator lymph node is most indicative of metastatic disease. 2. Mildly hypermetabolic left hilar lymph nodes are nonspecific. Continued attention on follow-up exams is warranted. 3. Right upper lobe pulmonary nodule shows metabolism after just above blood pool and is therefore indeterminate. Continued attention on follow-up exams is warranted. 4. Aortic atherosclerosis (ICD10-170.0). Coronary artery calcification. 5. Cholelithiasis   Plan is to proceed with 4-6 cycles of carboplatin/ gemzar1 week on and one-week off as patient could not tolerate 2-week on and one week off regimen. Cycle 1 started on 09/22/2017  Interval history- he still has intermittent episodes of bleeding when he urinates. It is gradually improving.  Also reports that he developed some penile swelling over the last 1 week to 10 days intermittently at night which resolves on its own and he has been using Tylenol for this.  He still wakes up every 2-3 hours at night to urinate.  Denies any fevers  ECOG PS- 1 Pain scale- 0 Opioid associated constipation- no  Review of systems- Review of Systems  Constitutional: Negative for chills, fever, malaise/fatigue and weight loss.  HENT: Negative for congestion, ear discharge and nosebleeds.   Eyes: Negative for blurred vision.  Respiratory: Negative for cough, hemoptysis, sputum production, shortness of breath and wheezing.   Cardiovascular: Negative for chest pain, palpitations, orthopnea and claudication.  Gastrointestinal: Negative for abdominal pain, blood in stool, constipation, diarrhea, heartburn, melena, nausea and vomiting.  Genitourinary: Positive for hematuria. Negative for dysuria, flank pain, frequency and urgency.  Musculoskeletal: Negative for back pain, joint pain and myalgias.  Skin: Negative for rash.  Neurological: Negative for dizziness, tingling, focal weakness, seizures, weakness  and headaches.  Endo/Heme/Allergies: Does not bruise/bleed easily.  Psychiatric/Behavioral:  Negative for depression and suicidal ideas. The patient does not have insomnia.        Allergies  Allergen Reactions  . Demerol [Meperidine] Nausea And Vomiting  . Lipitor [Atorvastatin] Swelling  . Sulfa Antibiotics Nausea And Vomiting and Rash     Past Medical History:  Diagnosis Date  . Arthritis   . Benign fibroma of prostate 08/23/2013  . BPH (benign prostatic hyperplasia)   . Calculus of kidney 08/23/2013  . Glaucoma    no drops in 3 mo pressure good, pt denies glaucoma, eye pressure has been measuring alright.  . History of kidney stones   . HLD (hyperlipidemia)   . HOH (hard of hearing)    Left Hearing Aid  . HTN (hypertension) 12/26/2014  . Hypertension   . Hyponatremia 12/26/2014  . Migraines    history of migraines when he was younger.  [name] Restless leg syndrome   . Sinus drainage   . Skin cancer   . Urothelial cancer ([place])    chemo tx's.  [name] UTI (lower urinary tract infection) 12/26/2014  . Vertigo      Past Surgical History:  Procedure Laterality Date  . COLONOSCOPY    . CYSTOSCOPY W/ RETROGRADES Right 01/30/2015   Procedure: CYSTOSCOPY WITH RETROGRADE PYELOGRAM;  Surgeon: [name], MD;  Location: [facility];  Service: Urology;  Laterality: Right;  . CYSTOSCOPY W/ RETROGRADES Bilateral 02/26/2016   Procedure: CYSTOSCOPY WITH RETROGRADE PYELOGRAM;  Surgeon: [name], MD;  Location: [facility];  Service: Urology;  Laterality: Bilateral;  . CYSTOSCOPY W/ URETERAL STENT PLACEMENT Right 08/20/2015   Procedure: CYSTOSCOPY WITH RETROGRADE PYELOGRAM/POSSIBLE URETERAL STENT PLACEMENT/BLADDER BIOPSY;  Surgeon: [name], MD;  Location: [facility];  Service: Urology;  Laterality: Right;  . CYSTOSCOPY W/ URETERAL STENT PLACEMENT Right 09/12/2015   Procedure: CYSTOSCOPY WITH STENT REPLACEMENT;  Surgeon: [name], MD;  Location: [facility];  Service: Urology;   Laterality: Right;  . CYSTOSCOPY WITH BIOPSY Right 09/12/2015   Procedure: CYSTOSCOPY WITH BLADDER AND URETERAL BIOPSY;  Surgeon: [name], MD;  Location: [facility];  Service: Urology;  Laterality: Right;  . CYSTOSCOPY WITH STENT PLACEMENT Right 01/30/2015   Procedure: CYSTOSCOPY WITH STENT PLACEMENT;  Surgeon: [name], MD;  Location: [facility];  Service: Urology;  Laterality: Right;  . CYSTOSCOPY WITH STENT PLACEMENT Right 12/28/2017   Procedure: [place] WITH STENT Exchange;  Surgeon: [name], MD;  Location: [facility];  Service: Urology;  Laterality: Right;  . CYSTOSCOPY/URETEROSCOPY/HOLMIUM LASER/STENT PLACEMENT Right 08/19/2017   Procedure: CYSTOSCOPY/URETEROSCOPY/HOLMIUM LASER/STENT PLACEMENT;  Surgeon: [name], MD;  Location: [facility];  Service: Urology;  Laterality: Right;  . EYE SURGERY Bilateral    Cataract Extraction with IOL  . goiter removal    . HOLMIUM LASER APPLICATION N/A 06/03/7122   Procedure:  HOLMIUM LASER APPLICATION;  Surgeon: [name], MD;  Location: [facility];  Service: Urology;  Laterality: N/A;  . PORTA CATH INSERTION N/A 09/23/2017   Procedure: PORTA CATH INSERTION;  Surgeon: Algernon Huxley, MD;  Location: [facility];  Service: Cardiovascular;  Laterality: N/A;  . SPERMATOCELECTOMY    . TONSILLECTOMY    . TRANSURETHRAL RESECTION OF BLADDER TUMOR N/A 02/26/2016   Procedure: TRANSURETHRAL RESECTION OF BLADDER TUMOR (TURBT);  Surgeon: [name], MD;  Location: [facility];  Service: Urology;  Laterality: N/A;  . TRANSURETHRAL RESECTION OF BLADDER TUMOR WITH MITOMYCIN-C N/A 09/12/2015   Procedure: TRANSURETHRAL RESECTION OF BLADDER TUMOR ;  Surgeon: [name], MD;  Location: [facility];  Service:  Urology;  Laterality: N/A;  . TRANSURETHRAL RESECTION OF BLADDER TUMOR WITH MITOMYCIN-C N/A 03/24/2016   Procedure: TRANSURETHRAL RESECTION OF BLADDER TUMOR WITH MITOMYCIN-C  (SMALL);  Surgeon: [name], MD;  Location: [facility];   Service: Urology;  Laterality: N/A;  . URETERAL BIOPSY Right 08/19/2017   Procedure: Renal Mass BIOPSY;  Surgeon: [name], MD;  Location: [facility];  Service: Urology;  Laterality: Right;  . URETEROSCOPY Right 01/30/2015   Procedure: URETEROSCOPY/ WITH BIOPSY AND CYTOLOGY BRUSHING;  Surgeon: [name], MD;  Location: [facility];  Service: Urology;  Laterality: Right;  . URETEROSCOPY Right 08/20/2015   Procedure: URETEROSCOPY;  Surgeon: [name], MD;  Location: [facility];  Service: Urology;  Laterality: Right;  . URETEROSCOPY Right 09/12/2015   Procedure: URETEROSCOPY;  Surgeon: [name], MD;  Location: [facility];  Service: Urology;  Laterality: Right;  . URETEROSCOPY Right 02/26/2016   Procedure: URETEROSCOPY;  Surgeon: [name], MD;  Location: [facility];  Service: Urology;  Laterality: Right;    Social History   Socioeconomic History  . Marital status: Married    Spouse name: Not on file  . Number of children: Not on file  . Years of education: Not on file  . Highest education level: Not on file  Occupational History  . Not on file  Social Needs  . Financial resource strain: Not on file  . Food insecurity:    Worry: Not on file    Inability: Not on file  . Transportation needs:    Medical: Not on file    Non-medical: Not on file  Tobacco Use  . Smoking status: Never Smoker  . Smokeless tobacco: Never Used  Substance and Sexual Activity  . Alcohol use: No    Alcohol/week: 0.0 standard drinks  . Drug use: No  . Sexual activity: Not on file  Lifestyle  . Physical activity:    Days per week: Not on file    Minutes per session: Not on file  . Stress: Not on file  Relationships  . Social connections:    Talks on phone: Not on file    Gets together: Not on file    Attends religious service: Not on file    Active member of club or organization: Not on file    Attends meetings of clubs or organizations: Not on file    Relationship status: Not on file    . Intimate partner violence:    Fear of current or ex partner: Not on file    Emotionally abused: Not on file    Physically abused: Not on file    Forced sexual activity: Not on file  Other Topics Concern  . Not on file  Social History Narrative  . Not on file    Family History  Problem Relation Age of Onset  . Hypertension Mother   . Hypertension Father   . Prostate cancer Brother   . Kidney disease Neg Hx   . Kidney cancer Neg Hx   . Bladder Cancer Neg Hx      Current Outpatient Medications:  .  acetaminophen (TYLENOL) 500 MG tablet, Take 1,000 mg by mouth daily as needed for moderate pain. , Disp: , Rfl:  .  benazepril-hydrochlorthiazide (LOTENSIN HCT) 20-12.5 MG tablet, Take 1 tablet by mouth daily. , Disp: , Rfl:  .  dexamethasone (DECADRON) 4 MG tablet, Take 2 tablets (8 mg total) by mouth daily. Start the day after carboplatin chemotherapy for 2 days. (Patient not taking: Reported on  01/12/2018), Disp: 30 tablet, Rfl: 1 .  diazepam (VALIUM) 2 MG tablet, Take 5 mg by mouth at bedtime. , Disp: , Rfl:  .  docusate sodium (COLACE) 100 MG capsule, Take 100 mg by mouth 2 (two) times daily. , Disp: , Rfl:  .  fenofibrate micronized (LOFIBRA) 134 MG capsule, Take 134 mg by mouth daily before breakfast. , Disp: , Rfl:  .  fluticasone (FLONASE) 50 MCG/ACT nasal spray, Place 2 sprays into both nostrils at bedtime., Disp: , Rfl:  .  HYDROcodone-acetaminophen (NORCO/VICODIN) 5-325 MG tablet, Take 1 tablet by mouth every 6 (six) hours as needed for severe pain., Disp: , Rfl:  .  hydrocortisone cream 1 %, Apply 1 application topically daily as needed for itching., Disp: , Rfl:  .  lidocaine-prilocaine (EMLA) cream, Apply to affected area once (Patient not taking: Reported on 12/21/2017), Disp: 30 g, Rfl: 3 .  LORazepam (ATIVAN) 0.5 MG tablet, Take 1 tablet (0.5 mg total) by mouth every 6 (six) hours as needed (Nausea or vomiting). (Patient not taking: Reported on 12/23/2017), Disp: 30  tablet, Rfl: 0 .  Multiple Vitamin (MULTIVITAMIN WITH MINERALS) TABS tablet, Take 1 tablet by mouth daily., Disp: , Rfl:  .  ondansetron (ZOFRAN) 8 MG tablet, Take 1 tablet (8 mg total) by mouth 2 (two) times daily as needed for refractory nausea / vomiting. Start on day 3 after carboplatin chemo. (Patient not taking: Reported on 12/29/2017), Disp: 30 tablet, Rfl: 1 .  Potassium 99 MG TABS, Take 99 mg by mouth at bedtime., Disp: , Rfl:  .  prochlorperazine (COMPAZINE) 10 MG tablet, Take 1 tablet (10 mg total) by mouth every 6 (six) hours as needed (Nausea or vomiting). (Patient not taking: Reported on 12/29/2017), Disp: 30 tablet, Rfl: 1 .  psyllium (METAMUCIL) 58.6 % packet, Take 1 packet by mouth daily. , Disp: , Rfl:  .  tamsulosin (FLOMAX) 0.4 MG CAPS capsule, Take 1 capsule (0.4 mg total) by mouth daily. (Patient taking differently: Take 0.4 mg by mouth every evening. ), Disp: 90 capsule, Rfl: 3  Physical exam:  Vitals:   01/26/18 0925  BP: 125/70  Pulse: 76  Resp: 18  TempSrc: Tympanic  SpO2: 94%  Weight: 198 lb 4.8 oz (89.9 kg)  Height: 6' (1.829 m)   Physical Exam  Constitutional: He is oriented to person, place, and time. He appears well-developed and well-nourished.  HENT:  Head: Normocephalic and atraumatic.  Eyes: Pupils are equal, round, and reactive to light. EOM are normal.  Neck: Normal range of motion.  Cardiovascular: Normal rate, regular rhythm and normal heart sounds.  Pulmonary/Chest: Effort normal and breath sounds normal.  Abdominal: Soft. Bowel sounds are normal.  Neurological: He is alert and oriented to person, place, and time.  Skin: Skin is warm and dry.     CMP Latest Ref Rng & Units 01/26/2018  Glucose 70 - 99 mg/dL 146(H)  BUN 8 - 23 mg/dL 14  Creatinine 0.61 - 1.24 mg/dL 1.12  Sodium 135 - 145 mmol/L 131(L)  Potassium 3.5 - 5.1 mmol/L 4.0  Chloride 98 - 111 mmol/L 97(L)  CO2 22 - 32 mmol/L 26  Calcium 8.9 - 10.3 mg/dL 8.9  Total Protein 6.5 -  8.1 g/dL 6.6  Total Bilirubin 0.3 - 1.2 mg/dL 0.6  Alkaline Phos 38 - 126 U/L 79  AST 15 - 41 U/L 35  ALT 0 - 44 U/L 22   CBC Latest Ref Rng & Units 01/26/2018  WBC 4.0 -  10.5 K/uL 7.5  Hemoglobin 13.0 - 17.0 g/dL 10.3(L)  Hematocrit 39.0 - 52.0 % 30.5(L)  Platelets 150 - 400 K/uL 262     Assessment and plan- Patient is a 84 y.o. male  with metastatic urothelial carcinoma stage IV cT2 N0 M1 with metastases to the obturator lymph node as well as hilar adenopathy and right upper lobe lung nodule.  Therefore on treatment assessment prior to cycle 5-day 15 of gemcitabine and carboplatin chemotherapy  Counts okay to proceed with cycle 5-day 15 of gemcitabine and carboplatin today.  No udenyca on day 3.  He has been getting chemo every other week due to neutropenia.  Plan is to complete 6 cycles followed by repeat scans.  I will see him back in 2 weeks for cycle 6-day 1 of gemcitabine and carboplatin.  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  Continue to monitor  Hematuria: This is intermittent and has started since he had the last stent exchanged.  Is gradually improving and he has been in touch with urology regarding this.  He also had urinalysis and urine culture checked recently which was negative for any infection   Visit Diagnosis 1. 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 Encounter for antineoplastic chemotherapy   3. Antineoplastic chemotherapy induced anemia      Dr. Randa Evens, MD, MPH [facility] [phone] 01/25/2018 2:56 PM

## 2018-01-26 ENCOUNTER — Inpatient Hospital Stay: Payer: Medicare HMO

## 2018-01-26 ENCOUNTER — Encounter: Payer: Self-pay | Admitting: Oncology

## 2018-01-26 ENCOUNTER — Inpatient Hospital Stay (HOSPITAL_BASED_OUTPATIENT_CLINIC_OR_DEPARTMENT_OTHER): Payer: Medicare HMO | Admitting: Oncology

## 2018-01-26 VITALS — BP 125/70 | HR 76 | Resp 18 | Ht 72.0 in | Wt 198.3 lb

## 2018-01-26 DIAGNOSIS — C669 Malignant neoplasm of unspecified ureter: Secondary | ICD-10-CM

## 2018-01-26 DIAGNOSIS — E871 Hypo-osmolality and hyponatremia: Secondary | ICD-10-CM

## 2018-01-26 DIAGNOSIS — I10 Essential (primary) hypertension: Secondary | ICD-10-CM

## 2018-01-26 DIAGNOSIS — D6481 Anemia due to antineoplastic chemotherapy: Secondary | ICD-10-CM

## 2018-01-26 DIAGNOSIS — D701 Agranulocytosis secondary to cancer chemotherapy: Secondary | ICD-10-CM

## 2018-01-26 DIAGNOSIS — Z8551 Personal history of malignant neoplasm of bladder: Secondary | ICD-10-CM

## 2018-01-26 DIAGNOSIS — Z85828 Personal history of other malignant neoplasm of skin: Secondary | ICD-10-CM

## 2018-01-26 DIAGNOSIS — R319 Hematuria, unspecified: Secondary | ICD-10-CM

## 2018-01-26 DIAGNOSIS — Z79899 Other long term (current) drug therapy: Secondary | ICD-10-CM

## 2018-01-26 DIAGNOSIS — Z5111 Encounter for antineoplastic chemotherapy: Secondary | ICD-10-CM

## 2018-01-26 DIAGNOSIS — T451X5A Adverse effect of antineoplastic and immunosuppressive drugs, initial encounter: Secondary | ICD-10-CM

## 2018-01-26 DIAGNOSIS — C661 Malignant neoplasm of right ureter: Secondary | ICD-10-CM | POA: Diagnosis not present

## 2018-01-26 DIAGNOSIS — C7801 Secondary malignant neoplasm of right lung: Secondary | ICD-10-CM | POA: Diagnosis not present

## 2018-01-26 DIAGNOSIS — Z8249 Family history of ischemic heart disease and other diseases of the circulatory system: Secondary | ICD-10-CM

## 2018-01-26 LAB — COMPREHENSIVE METABOLIC PANEL
ALBUMIN: 3.7 g/dL (ref 3.5–5.0)
ALT: 22 U/L (ref 0–44)
ANION GAP: 8 (ref 5–15)
AST: 35 U/L (ref 15–41)
Alkaline Phosphatase: 79 U/L (ref 38–126)
BUN: 14 mg/dL (ref 8–23)
CHLORIDE: 97 mmol/L — AB (ref 98–111)
CO2: 26 mmol/L (ref 22–32)
Calcium: 8.9 mg/dL (ref 8.9–10.3)
Creatinine, Ser: 1.12 mg/dL (ref 0.61–1.24)
GFR calc Af Amer: >60 mL/min (ref >60)
GFR calc non Af Amer: 58 mL/min — ABNORMAL LOW (ref >60)
GLUCOSE: 146 mg/dL — AB (ref 70–99)
Potassium: 4 mmol/L (ref 3.5–5.1)
Sodium: 131 mmol/L — ABNORMAL LOW (ref 135–145)
TOTAL PROTEIN: 6.6 g/dL (ref 6.5–8.1)
Total Bilirubin: 0.6 mg/dL (ref 0.3–1.2)

## 2018-01-26 LAB — CBC WITH DIFFERENTIAL/PLATELET
Abs Immature Granulocytes: 0.33 10*3/uL — ABNORMAL HIGH (ref 0.00–0.07)
BASOS PCT: 1 %
Basophils Absolute: 0 10*3/uL (ref 0.0–0.1)
Eosinophils Absolute: 0.1 10*3/uL (ref 0.0–0.5)
Eosinophils Relative: 1 %
HCT: 30.5 % — ABNORMAL LOW (ref 39.0–52.0)
Hemoglobin: 10.3 g/dL — ABNORMAL LOW (ref 13.0–17.0)
Immature Granulocytes: 4 %
LYMPHS ABS: 0.8 10*3/uL (ref 0.7–4.0)
Lymphocytes Relative: 11 %
MCH: 31.2 pg (ref 26.0–34.0)
MCHC: 33.8 g/dL (ref 30.0–36.0)
MCV: 92.4 fL (ref 80.0–100.0)
MONOS PCT: 8 %
Monocytes Absolute: 0.6 10*3/uL (ref 0.1–1.0)
NEUTROS PCT: 75 %
Neutro Abs: 5.7 10*3/uL (ref 1.7–7.7)
PLATELETS: 262 10*3/uL (ref 150–400)
RBC: 3.3 MIL/uL — ABNORMAL LOW (ref 4.22–5.81)
RDW: 17.2 % — ABNORMAL HIGH (ref 11.5–15.5)
WBC: 7.5 10*3/uL (ref 4.0–10.5)
nRBC: 0 % (ref 0.0–0.2)

## 2018-01-26 MED ORDER — SODIUM CHLORIDE 0.9 % IV SOLN
Freq: Once | INTRAVENOUS | Status: AC
  Administered 2018-01-26: 11:00:00 via INTRAVENOUS
  Filled 2018-01-26: qty 250

## 2018-01-26 MED ORDER — HEPARIN SOD (PORK) LOCK FLUSH 100 UNIT/ML IV SOLN
500.0000 [IU] | Freq: Once | INTRAVENOUS | Status: AC
  Administered 2018-01-26: 500 [IU] via INTRAVENOUS
  Filled 2018-01-26: qty 5

## 2018-01-26 MED ORDER — SODIUM CHLORIDE 0.9% FLUSH
10.0000 mL | INTRAVENOUS | Status: DC | PRN
  Administered 2018-01-26: 10 mL via INTRAVENOUS
  Filled 2018-01-26: qty 10

## 2018-01-26 MED ORDER — SODIUM CHLORIDE 0.9 % IV SOLN
180.0000 mg | Freq: Once | INTRAVENOUS | Status: AC
  Administered 2018-01-26: 180 mg via INTRAVENOUS
  Filled 2018-01-26: qty 18

## 2018-01-26 MED ORDER — SODIUM CHLORIDE 0.9 % IV SOLN
Freq: Once | INTRAVENOUS | Status: AC
  Administered 2018-01-26: 10:00:00 via INTRAVENOUS
  Filled 2018-01-26: qty 250

## 2018-01-26 MED ORDER — SODIUM CHLORIDE 0.9 % IV SOLN
1600.0000 mg | Freq: Once | INTRAVENOUS | Status: AC
  Administered 2018-01-26: 1600 mg via INTRAVENOUS
  Filled 2018-01-26: qty 26.3

## 2018-01-26 MED ORDER — SODIUM CHLORIDE 0.9 % IV SOLN: 10.0000 mg | Freq: Once | INTRAVENOUS | Status: DC

## 2018-01-26 MED ORDER — DEXAMETHASONE SODIUM PHOSPHATE 10 MG/ML IJ SOLN
10.0000 mg | Freq: Once | INTRAMUSCULAR | Status: AC
  Administered 2018-01-26: 10 mg via INTRAVENOUS
  Filled 2018-01-26: qty 1

## 2018-01-26 MED ORDER — DEXAMETHASONE SODIUM PHOSPHATE 10 MG/ML IJ SOLN: 10.0000 mg | Freq: Once | INTRAMUSCULAR | Status: AC

## 2018-01-26 MED ORDER — PALONOSETRON HCL INJECTION 0.25 MG/5ML
0.2500 mg | Freq: Once | INTRAVENOUS | Status: AC
  Administered 2018-01-26: 0.25 mg via INTRAVENOUS
  Filled 2018-01-26: qty 5

## 2018-01-26 NOTE — Progress Notes (Signed)
No new changes noted today 

## 2018-01-29 ENCOUNTER — Telehealth: Payer: Self-pay | Admitting: Urology

## 2018-01-29 NOTE — Telephone Encounter (Signed)
Pt lmom stating that he wants to speak to someone about his "issues" and has questions. Pt did not elaborate with any details. Please advise pt at [phone]. Thank you.

## 2018-01-29 NOTE — Telephone Encounter (Signed)
Patient notified that urinary symptoms and blood are due to stent in place he was instructed to try the Myrbetriq again with 50mg  instead of 25mg  and if this helps we can call in a script for him. Patient agrees with this plan

## 2018-02-03 ENCOUNTER — Other Ambulatory Visit: Payer: Self-pay | Admitting: Nurse Practitioner

## 2018-02-03 MED ORDER — HYDROCODONE-ACETAMINOPHEN 5-325 MG PO TABS: 1.0000 | ORAL_TABLET | Freq: Four times a day (QID) | ORAL | 0 refills | Status: DC | PRN

## 2018-02-03 NOTE — Progress Notes (Signed)
Patient called [facility] requesting refill of hydrocodone.   As mandated by the [place] STOP Act (Strengthen Opioid Misuse Prevention), the [place] Controlled Substance Reporting System ([place]) was reviewed for this patient.  Below is the past 9-months of controlled substance prescriptions as displayed by the registry.  I have personally consulted with my supervising physician, Dr. Janese Banks, who agrees that continuation of opiate therapy is medically appropriate at this time and agrees to provide continual monitoring, including urine/blood drug screens, as indicated. Prescription sent electronically using [employer] secure transmission to requested pharmacy.   [place] Reviewed:    Patient has follow-up appointment scheduled with Dr. Janese Banks on 02/09/2018.  Beckey Rutter, DNP, AGNP-C [place] at [facility] [phone] (work cell) [phone] (office)

## 2018-02-07 ENCOUNTER — Other Ambulatory Visit: Payer: Self-pay | Admitting: *Deleted

## 2018-02-07 DIAGNOSIS — C669 Malignant neoplasm of unspecified ureter: Secondary | ICD-10-CM

## 2018-02-09 ENCOUNTER — Inpatient Hospital Stay: Payer: Medicare HMO

## 2018-02-09 ENCOUNTER — Other Ambulatory Visit: Payer: Self-pay

## 2018-02-09 ENCOUNTER — Inpatient Hospital Stay (HOSPITAL_BASED_OUTPATIENT_CLINIC_OR_DEPARTMENT_OTHER): Payer: Medicare HMO | Admitting: Oncology

## 2018-02-09 ENCOUNTER — Encounter: Payer: Self-pay | Admitting: Oncology

## 2018-02-09 ENCOUNTER — Inpatient Hospital Stay: Payer: Medicare HMO | Attending: Oncology

## 2018-02-09 VITALS — BP 137/77 | HR 75 | Temp 97.4°F | Resp 18 | Wt 198.2 lb

## 2018-02-09 DIAGNOSIS — C7801 Secondary malignant neoplasm of right lung: Secondary | ICD-10-CM | POA: Insufficient documentation

## 2018-02-09 DIAGNOSIS — C669 Malignant neoplasm of unspecified ureter: Secondary | ICD-10-CM

## 2018-02-09 DIAGNOSIS — Z8551 Personal history of malignant neoplasm of bladder: Secondary | ICD-10-CM | POA: Insufficient documentation

## 2018-02-09 DIAGNOSIS — D6481 Anemia due to antineoplastic chemotherapy: Secondary | ICD-10-CM | POA: Diagnosis not present

## 2018-02-09 DIAGNOSIS — Z5111 Encounter for antineoplastic chemotherapy: Secondary | ICD-10-CM

## 2018-02-09 DIAGNOSIS — I10 Essential (primary) hypertension: Secondary | ICD-10-CM | POA: Insufficient documentation

## 2018-02-09 DIAGNOSIS — Z85828 Personal history of other malignant neoplasm of skin: Secondary | ICD-10-CM | POA: Insufficient documentation

## 2018-02-09 DIAGNOSIS — Z5189 Encounter for other specified aftercare: Secondary | ICD-10-CM | POA: Insufficient documentation

## 2018-02-09 DIAGNOSIS — Z79899 Other long term (current) drug therapy: Secondary | ICD-10-CM

## 2018-02-09 DIAGNOSIS — C661 Malignant neoplasm of right ureter: Secondary | ICD-10-CM | POA: Diagnosis not present

## 2018-02-09 DIAGNOSIS — Z8249 Family history of ischemic heart disease and other diseases of the circulatory system: Secondary | ICD-10-CM | POA: Diagnosis not present

## 2018-02-09 DIAGNOSIS — N4889 Other specified disorders of penis: Secondary | ICD-10-CM | POA: Diagnosis not present

## 2018-02-09 DIAGNOSIS — T451X5A Adverse effect of antineoplastic and immunosuppressive drugs, initial encounter: Secondary | ICD-10-CM

## 2018-02-09 LAB — COMPREHENSIVE METABOLIC PANEL
ALT: 18 U/L (ref 0–44)
AST: 35 U/L (ref 15–41)
Albumin: 3.7 g/dL (ref 3.5–5.0)
Alkaline Phosphatase: 60 U/L (ref 38–126)
Anion gap: 8 (ref 5–15)
BUN: 16 mg/dL (ref 8–23)
CO2: 27 mmol/L (ref 22–32)
Calcium: 8.9 mg/dL (ref 8.9–10.3)
Chloride: 98 mmol/L (ref 98–111)
Creatinine, Ser: 1.2 mg/dL (ref 0.61–1.24)
GFR, EST NON AFRICAN AMERICAN: 55 mL/min — AB (ref >60)
Glucose, Bld: 139 mg/dL — ABNORMAL HIGH (ref 70–99)
Potassium: 3.7 mmol/L (ref 3.5–5.1)
Sodium: 133 mmol/L — ABNORMAL LOW (ref 135–145)
Total Bilirubin: 0.5 mg/dL (ref 0.3–1.2)
Total Protein: 6.7 g/dL (ref 6.5–8.1)

## 2018-02-09 LAB — CBC WITH DIFFERENTIAL/PLATELET
Abs Immature Granulocytes: 0.01 10*3/uL (ref 0.00–0.07)
Basophils Absolute: 0 10*3/uL (ref 0.0–0.1)
Basophils Relative: 1 %
Eosinophils Absolute: 0.1 10*3/uL (ref 0.0–0.5)
Eosinophils Relative: 2 %
HCT: 29.7 % — ABNORMAL LOW (ref 39.0–52.0)
Hemoglobin: 10.1 g/dL — ABNORMAL LOW (ref 13.0–17.0)
Immature Granulocytes: 0 %
Lymphocytes Relative: 21 %
Lymphs Abs: 0.6 10*3/uL — ABNORMAL LOW (ref 0.7–4.0)
MCH: 31.6 pg (ref 26.0–34.0)
MCHC: 34 g/dL (ref 30.0–36.0)
MCV: 92.8 fL (ref 80.0–100.0)
MONOS PCT: 18 %
Monocytes Absolute: 0.5 10*3/uL (ref 0.1–1.0)
Neutro Abs: 1.7 10*3/uL (ref 1.7–7.7)
Neutrophils Relative %: 58 %
Platelets: 259 10*3/uL (ref 150–400)
RBC: 3.2 MIL/uL — ABNORMAL LOW (ref 4.22–5.81)
RDW: 15.9 % — ABNORMAL HIGH (ref 11.5–15.5)
WBC: 2.9 10*3/uL — ABNORMAL LOW (ref 4.0–10.5)
nRBC: 0 % (ref 0.0–0.2)

## 2018-02-09 MED ORDER — HEPARIN SOD (PORK) LOCK FLUSH 100 UNIT/ML IV SOLN: 500.0000 [IU] | Freq: Once | INTRAVENOUS | Status: DC | PRN

## 2018-02-09 MED ORDER — SODIUM CHLORIDE 0.9% FLUSH
10.0000 mL | Freq: Once | INTRAVENOUS | Status: AC
  Administered 2018-02-09: 10 mL via INTRAVENOUS
  Filled 2018-02-09: qty 10

## 2018-02-09 MED ORDER — SODIUM CHLORIDE 0.9 % IV SOLN
Freq: Once | INTRAVENOUS | Status: AC
  Administered 2018-02-09: 10:00:00 via INTRAVENOUS
  Filled 2018-02-09: qty 250

## 2018-02-09 MED ORDER — SODIUM CHLORIDE 0.9 % IV SOLN
1600.0000 mg | Freq: Once | INTRAVENOUS | Status: AC
  Administered 2018-02-09: 1600 mg via INTRAVENOUS
  Filled 2018-02-09: qty 42

## 2018-02-09 MED ORDER — PALONOSETRON HCL INJECTION 0.25 MG/5ML
0.2500 mg | Freq: Once | INTRAVENOUS | Status: AC
  Administered 2018-02-09: 0.25 mg via INTRAVENOUS
  Filled 2018-02-09: qty 5

## 2018-02-09 MED ORDER — SODIUM CHLORIDE 0.9 % IV SOLN
163.2000 mg | Freq: Once | INTRAVENOUS | Status: AC
  Administered 2018-02-09: 160 mg via INTRAVENOUS
  Filled 2018-02-09: qty 16

## 2018-02-09 MED ORDER — HEPARIN SOD (PORK) LOCK FLUSH 100 UNIT/ML IV SOLN
500.0000 [IU] | Freq: Once | INTRAVENOUS | Status: AC
  Administered 2018-02-09: 500 [IU] via INTRAVENOUS
  Filled 2018-02-09: qty 5

## 2018-02-09 MED ORDER — DEXAMETHASONE SODIUM PHOSPHATE 10 MG/ML IJ SOLN
10.0000 mg | Freq: Once | INTRAMUSCULAR | Status: AC
  Administered 2018-02-09: 10 mg via INTRAVENOUS
  Filled 2018-02-09: qty 1

## 2018-02-09 NOTE — Progress Notes (Signed)
Here for follow up. Per pt up voiding 3-4 x per night -c/o pain "in penis" all day and esp w urinating.  Drinking 6 plus glasses of water per day. Pain so bad yesterday in penis -took pain meds w effect. No current pain he stated. ( pain intermittent -lasts 20 meds or all day till I take pain med per pt )

## 2018-02-09 NOTE — Progress Notes (Signed)
Hematology/Oncology Consult note [facility]  [phone] Fax:([phone]  Patient Care Team: Idelle Crouch, MD as PCP - General (Internal Medicine)   Name of the patient: Anthony Gonzales  [id number]  01-23-1932   Date of visit: 02/09/18  Diagnosis- Metastatic urothelial carcinoma with possible mets to the obturator node. Indeterminate hilar lymph nodes and LUL lung nodule  Chief complaint/ Reason for visit-on treatment assessment prior to cycle 6-day 1 of carboplatin and gemcitabine  Heme/Onc history: patient is a 84 year old male with past medical history significant for hypertension and long-standing history of superficial bladder cancer for which he sees [name]. He has undergone TURBT as well as ureteroscopy since 2017 along with mitomycin as well in the past. Most recently he underwent CT abdomen on 08/06/2017 which showed a soft tissue mass in the right renal pelvis concerning for upper tract urothelial neoplasm. Soft tissue fullness at the right ureterovesical junction with proximal right hydroureteronephrosis. Several millimeter attenuation lesion in the pancreatic head.  He underwent diagnostic ureteroscopy and was found to have 2 high-grade lesions within his right kidney. There was a nodular high-grade appearing lesion in the right anterior renal pelvis. There was also a second ureteral tumor fungating from the right ureteral orifice extending into the distal ureter also consistent with high-grade invasive urothelial carcinoma. Muscle invasion could not be assessed. He also underwent a CT chest which showed a rounded nodule in the right upper lobe measuring 14 mm concerning for metastases. 2 other 3 mm lesions were also noted in the left upper lobe likely benign  Plan initially was neoadjuvant chemotherapy followed by possible surgery but given the presence of lung lesion patient has been referred to oncology for the  same.  PET/CT on 09/21/17 showed:IMPRESSION: 1. Hypermetabolic right obturator lymph node is most indicative of metastatic disease. 2. Mildly hypermetabolic left hilar lymph nodes are nonspecific. Continued attention on follow-up exams is warranted. 3. Right upper lobe pulmonary nodule shows metabolism after just above blood pool and is therefore indeterminate. Continued attention on follow-up exams is warranted. 4. Aortic atherosclerosis (ICD10-170.0). Coronary artery calcification. 5. Cholelithiasis   Plan is to proceed with 4-6 cycles of carboplatin/ gemzar1 week on and one-week off as patient could not tolerate 2-week on and one week off regimen. Cycle 1 started on 09/22/2017  Interval history- hematuria has stopped over a week ago. He is sleeping better. He still gets pain in his penis on and off which can last for few minutes to few hours at time. He has been using prn pain meds for this with some relief. Denies other complaints  ECOG PS- 1 Pain scale- 0 Opioid associated constipation- no  Review of systems- Review of Systems  Constitutional: Positive for malaise/fatigue. Negative for chills, fever and weight loss.  HENT: Negative for congestion, ear discharge and nosebleeds.   Eyes: Negative for blurred vision.  Respiratory: Negative for cough, hemoptysis, sputum production, shortness of breath and wheezing.   Cardiovascular: Negative for chest pain, palpitations, orthopnea and claudication.  Gastrointestinal: Negative for abdominal pain, blood in stool, constipation, diarrhea, heartburn, melena, nausea and vomiting.  Genitourinary: Negative for dysuria, flank pain, frequency, hematuria and urgency.       Penile pain  Musculoskeletal: Negative for back pain, joint pain and myalgias.  Skin: Negative for rash.  Neurological: Negative for dizziness, tingling, focal weakness, seizures, weakness and headaches.  Endo/Heme/Allergies: Does not bruise/bleed easily.    Psychiatric/Behavioral: Negative for depression and suicidal ideas. The patient  does not have insomnia.       Allergies  Allergen Reactions  . Demerol [Meperidine] Nausea And Vomiting  . Lipitor [Atorvastatin] Swelling  . Sulfa Antibiotics Nausea And Vomiting and Rash     Past Medical History:  Diagnosis Date  . Arthritis   . Benign fibroma of prostate 08/23/2013  . BPH (benign prostatic hyperplasia)   . Calculus of kidney 08/23/2013  . Glaucoma    no drops in 3 mo pressure good, pt denies glaucoma, eye pressure has been measuring alright.  . History of kidney stones   . HLD (hyperlipidemia)   . HOH (hard of hearing)    Left Hearing Aid  . HTN (hypertension) 12/26/2014  . Hypertension   . Hyponatremia 12/26/2014  . Migraines    history of migraines when he was younger.  [name] Restless leg syndrome   . Sinus drainage   . Skin cancer   . Urothelial cancer ([place])    chemo tx's.  [name] UTI (lower urinary tract infection) 12/26/2014  . Vertigo      Past Surgical History:  Procedure Laterality Date  . COLONOSCOPY    . CYSTOSCOPY W/ RETROGRADES Right 01/30/2015   Procedure: CYSTOSCOPY WITH RETROGRADE PYELOGRAM;  Surgeon: [name], MD;  Location: [facility];  Service: Urology;  Laterality: Right;  . CYSTOSCOPY W/ RETROGRADES Bilateral 02/26/2016   Procedure: CYSTOSCOPY WITH RETROGRADE PYELOGRAM;  Surgeon: [name], MD;  Location: [facility];  Service: Urology;  Laterality: Bilateral;  . CYSTOSCOPY W/ URETERAL STENT PLACEMENT Right 08/20/2015   Procedure: CYSTOSCOPY WITH RETROGRADE PYELOGRAM/POSSIBLE URETERAL STENT PLACEMENT/BLADDER BIOPSY;  Surgeon: [name], MD;  Location: [facility];  Service: Urology;  Laterality: Right;  . CYSTOSCOPY W/ URETERAL STENT PLACEMENT Right 09/12/2015   Procedure: CYSTOSCOPY WITH STENT REPLACEMENT;  Surgeon: [name], MD;  Location: [facility];  Service: Urology;  Laterality: Right;  . CYSTOSCOPY WITH BIOPSY Right 09/12/2015   Procedure:  CYSTOSCOPY WITH BLADDER AND URETERAL BIOPSY;  Surgeon: [name], MD;  Location: [facility];  Service: Urology;  Laterality: Right;  . CYSTOSCOPY WITH STENT PLACEMENT Right 01/30/2015   Procedure: CYSTOSCOPY WITH STENT PLACEMENT;  Surgeon: [name], MD;  Location: [facility];  Service: Urology;  Laterality: Right;  . CYSTOSCOPY WITH STENT PLACEMENT Right 12/28/2017   Procedure: [place] WITH STENT Exchange;  Surgeon: [name], MD;  Location: [facility];  Service: Urology;  Laterality: Right;  . CYSTOSCOPY/URETEROSCOPY/HOLMIUM LASER/STENT PLACEMENT Right 08/19/2017   Procedure: CYSTOSCOPY/URETEROSCOPY/HOLMIUM LASER/STENT PLACEMENT;  Surgeon: [name], MD;  Location: [facility];  Service: Urology;  Laterality: Right;  . EYE SURGERY Bilateral    Cataract Extraction with IOL  . goiter removal    . HOLMIUM LASER APPLICATION N/A 06/16/8117   Procedure:  HOLMIUM LASER APPLICATION;  Surgeon: [name], MD;  Location: [facility];  Service: Urology;  Laterality: N/A;  . PORTA CATH INSERTION N/A 09/23/2017   Procedure: PORTA CATH INSERTION;  Surgeon: Algernon Huxley, MD;  Location: [place] [facility];  Service: Cardiovascular;  Laterality: N/A;  . SPERMATOCELECTOMY    . TONSILLECTOMY    . TRANSURETHRAL RESECTION OF BLADDER TUMOR N/A 02/26/2016   Procedure: TRANSURETHRAL RESECTION OF BLADDER TUMOR (TURBT);  Surgeon: [name], MD;  Location: [facility];  Service: Urology;  Laterality: N/A;  . TRANSURETHRAL RESECTION OF BLADDER TUMOR WITH MITOMYCIN-C N/A 09/12/2015   Procedure: TRANSURETHRAL RESECTION OF BLADDER TUMOR ;  Surgeon: [name], MD;  Location: [facility];  Service: Urology;  Laterality: N/A;  . TRANSURETHRAL RESECTION OF  BLADDER TUMOR WITH MITOMYCIN-C N/A 03/24/2016   Procedure: TRANSURETHRAL RESECTION OF BLADDER TUMOR WITH MITOMYCIN-C  (SMALL);  Surgeon: [name], MD;  Location: [facility];  Service: Urology;  Laterality: N/A;  . URETERAL BIOPSY Right 08/19/2017    Procedure: Renal Mass BIOPSY;  Surgeon: [name], MD;  Location: [facility];  Service: Urology;  Laterality: Right;  . URETEROSCOPY Right 01/30/2015   Procedure: URETEROSCOPY/ WITH BIOPSY AND CYTOLOGY BRUSHING;  Surgeon: [name], MD;  Location: [facility];  Service: Urology;  Laterality: Right;  . URETEROSCOPY Right 08/20/2015   Procedure: URETEROSCOPY;  Surgeon: [name], MD;  Location: [facility];  Service: Urology;  Laterality: Right;  . URETEROSCOPY Right 09/12/2015   Procedure: URETEROSCOPY;  Surgeon: [name], MD;  Location: [facility];  Service: Urology;  Laterality: Right;  . URETEROSCOPY Right 02/26/2016   Procedure: URETEROSCOPY;  Surgeon: [name], MD;  Location: [facility];  Service: Urology;  Laterality: Right;    Social History   Socioeconomic History  . Marital status: Married    Spouse name: Not on file  . Number of children: Not on file  . Years of education: Not on file  . Highest education level: Not on file  Occupational History  . Not on file  Social Needs  . Financial resource strain: Not on file  . Food insecurity:    Worry: Not on file    Inability: Not on file  . Transportation needs:    Medical: Not on file    Non-medical: Not on file  Tobacco Use  . Smoking status: Never Smoker  . Smokeless tobacco: Never Used  Substance and Sexual Activity  . Alcohol use: No    Alcohol/week: 0.0 standard drinks  . Drug use: No  . Sexual activity: Not on file  Lifestyle  . Physical activity:    Days per week: Not on file    Minutes per session: Not on file  . Stress: Not on file  Relationships  . Social connections:    Talks on phone: Not on file    Gets together: Not on file    Attends religious service: Not on file    Active member of club or organization: Not on file    Attends meetings of clubs or organizations: Not on file    Relationship status: Not on file  . Intimate partner violence:    Fear of current or ex partner: Not on file     Emotionally abused: Not on file    Physically abused: Not on file    Forced sexual activity: Not on file  Other Topics Concern  . Not on file  Social History Narrative  . Not on file    Family History  Problem Relation Age of Onset  . Hypertension Mother   . Hypertension Father   . Prostate cancer Brother   . Kidney disease Neg Hx   . Kidney cancer Neg Hx   . Bladder Cancer Neg Hx      Current Outpatient Medications:  .  acetaminophen (TYLENOL) 500 MG tablet, Take 1,000 mg by mouth daily as needed for moderate pain. , Disp: , Rfl:  .  benazepril-hydrochlorthiazide (LOTENSIN HCT) 20-12.5 MG tablet, Take 1 tablet by mouth daily. , Disp: , Rfl:  .  dexamethasone (DECADRON) 4 MG tablet, Take 2 tablets (8 mg total) by mouth daily. Start the day after carboplatin chemotherapy for 2 days. (Patient not taking: Reported on 01/12/2018), Disp: 30 tablet, Rfl: 1 .  diazepam (VALIUM)  2 MG tablet, Take 5 mg by mouth at bedtime. , Disp: , Rfl:  .  docusate sodium (COLACE) 100 MG capsule, Take 100 mg by mouth 2 (two) times daily. , Disp: , Rfl:  .  fenofibrate micronized (LOFIBRA) 134 MG capsule, Take 134 mg by mouth daily before breakfast. , Disp: , Rfl:  .  fluticasone (FLONASE) 50 MCG/ACT nasal spray, Place 2 sprays into both nostrils at bedtime., Disp: , Rfl:  .  HYDROcodone-acetaminophen (NORCO/VICODIN) 5-325 MG tablet, Take 1 tablet by mouth every 6 (six) hours as needed for severe pain., Disp: 30 tablet, Rfl: 0 .  hydrocortisone cream 1 %, Apply 1 application topically daily as needed for itching., Disp: , Rfl:  .  lidocaine-prilocaine (EMLA) cream, Apply to affected area once (Patient not taking: Reported on 12/21/2017), Disp: 30 g, Rfl: 3 .  LORazepam (ATIVAN) 0.5 MG tablet, Take 1 tablet (0.5 mg total) by mouth every 6 (six) hours as needed (Nausea or vomiting). (Patient not taking: Reported on 12/23/2017), Disp: 30 tablet, Rfl: 0 .  Multiple Vitamin (MULTIVITAMIN WITH MINERALS) TABS  tablet, Take 1 tablet by mouth daily., Disp: , Rfl:  .  ondansetron (ZOFRAN) 8 MG tablet, Take 1 tablet (8 mg total) by mouth 2 (two) times daily as needed for refractory nausea / vomiting. Start on day 3 after carboplatin chemo. (Patient not taking: Reported on 12/29/2017), Disp: 30 tablet, Rfl: 1 .  Potassium 99 MG TABS, Take 99 mg by mouth at bedtime., Disp: , Rfl:  .  prochlorperazine (COMPAZINE) 10 MG tablet, Take 1 tablet (10 mg total) by mouth every 6 (six) hours as needed (Nausea or vomiting). (Patient not taking: Reported on 12/29/2017), Disp: 30 tablet, Rfl: 1 .  psyllium (METAMUCIL) 58.6 % packet, Take 1 packet by mouth daily. , Disp: , Rfl:  .  tamsulosin (FLOMAX) 0.4 MG CAPS capsule, Take 1 capsule (0.4 mg total) by mouth daily. (Patient taking differently: Take 0.4 mg by mouth every evening. ), Disp: 90 capsule, Rfl: 3  Physical exam:  Vitals:   02/09/18 0843  BP: 137/77  Pulse: 75  Resp: 18  Temp: (!) 97.4 F (36.3 C)  TempSrc: Tympanic  Weight: 198 lb 3.2 oz (89.9 kg)   Physical Exam  Constitutional: He is oriented to person, place, and time. He appears well-developed and well-nourished.  HENT:  Head: Normocephalic and atraumatic.  Eyes: Pupils are equal, round, and reactive to light. EOM are normal.  Neck: Normal range of motion.  Cardiovascular: Normal rate, regular rhythm and normal heart sounds.  Pulmonary/Chest: Effort normal and breath sounds normal.  Abdominal: Soft. Bowel sounds are normal.  Neurological: He is alert and oriented to person, place, and time.  Skin: Skin is warm and dry.     CMP Latest Ref Rng & Units 02/09/2018  Glucose 70 - 99 mg/dL 139(H)  BUN 8 - 23 mg/dL 16  Creatinine 0.61 - 1.24 mg/dL 1.20  Sodium 135 - 145 mmol/L 133(L)  Potassium 3.5 - 5.1 mmol/L 3.7  Chloride 98 - 111 mmol/L 98  CO2 22 - 32 mmol/L 27  Calcium 8.9 - 10.3 mg/dL 8.9  Total Protein 6.5 - 8.1 g/dL 6.7  Total Bilirubin 0.3 - 1.2 mg/dL 0.5  Alkaline Phos 38 - 126 U/L  60  AST 15 - 41 U/L 35  ALT 0 - 44 U/L 18   CBC Latest Ref Rng & Units 02/09/2018  WBC 4.0 - 10.5 K/uL 2.9(L)  Hemoglobin 13.0 - 17.0 g/dL 10.1(L)  Hematocrit 39.0 - 52.0 % 29.7(L)  Platelets 150 - 400 K/uL 259     Assessment and plan- Patient is a 84 y.o. 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   He is here for on treatment assessment prior to cycle 6-day 1 of gemcitabine and carboplatin chemotherapy  Counts okay to proceed with cycle 6-day 1 of gemcitabine and carboplatin chemotherapy today.  He will receive on day 3 I will see him back in 2 weeks time for cycle 6-day 15 of gemcitabine and carboplatin which would be his last chemotherapy followed by repeat surveillance scans.  Chemo induced anemia: Stable around 10 continue to monitor  Penile pain- etiology unclear. This has started since his last stenting procedure. Continue prn pain meds. I will touch base with [name] for recommendations as well   Visit Diagnosis 1. Encounter for antineoplastic chemotherapy   2. 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 Anemia due to antineoplastic chemotherapy      Dr. Randa Evens, MD, MPH [facility] [phone] 02/09/2018 12:27 PM

## 2018-02-10 ENCOUNTER — Inpatient Hospital Stay: Payer: Medicare HMO

## 2018-02-10 DIAGNOSIS — C669 Malignant neoplasm of unspecified ureter: Secondary | ICD-10-CM

## 2018-02-10 DIAGNOSIS — Z5111 Encounter for antineoplastic chemotherapy: Secondary | ICD-10-CM | POA: Diagnosis not present

## 2018-02-10 MED ORDER — PEGFILGRASTIM-CBQV 6 MG/0.6ML ~~LOC~~ SOSY
6.0000 mg | PREFILLED_SYRINGE | Freq: Once | SUBCUTANEOUS | Status: AC
  Administered 2018-02-10: 6 mg via SUBCUTANEOUS

## 2018-02-22 NOTE — Progress Notes (Signed)
Hematology/Oncology Consult note [facility]  Telephone:([phone] [phone]  Patient Care Team: Idelle Crouch, MD as PCP - General (Internal Medicine)   Name of the patient: Anthony Gonzales  [id number]  1931-09-04   Date of visit: 02/22/18  Diagnosis- Metastatic urothelial carcinoma with possible mets to the obturator node. Indeterminate hilar lymph nodes and LUL lung nodule  Chief complaint/ Reason for visit-on treatment assessment prior to cycle 6-day 15 of carboplatin and gemcitabine chemotherapy  Heme/Onc history: patient is a 84 year old male with past medical history significant for hypertension and long-standing history of superficial bladder cancer for which he sees [name]. He has undergone TURBT as well as ureteroscopy since 2017 along with mitomycin as well in the past. Most recently he underwent CT abdomen on 08/06/2017 which showed a soft tissue mass in the right renal pelvis concerning for upper tract urothelial neoplasm. Soft tissue fullness at the right ureterovesical junction with proximal right hydroureteronephrosis. Several millimeter attenuation lesion in the pancreatic head.  He underwent diagnostic ureteroscopy and was found to have 2 high-grade lesions within his right kidney. There was a nodular high-grade appearing lesion in the right anterior renal pelvis. There was also a second ureteral tumor fungating from the right ureteral orifice extending into the distal ureter also consistent with high-grade invasive urothelial carcinoma. Muscle invasion could not be assessed. He also underwent a CT chest which showed a rounded nodule in the right upper lobe measuring 14 mm concerning for metastases. 2 other 3 mm lesions were also noted in the left upper lobe likely benign  Plan initially was neoadjuvant chemotherapy followed by possible surgery but given the presence of lung lesion patient has been referred to  oncology for the same.  PET/CT on 09/21/17 showed:IMPRESSION: 1. Hypermetabolic right obturator lymph node is most indicative of metastatic disease. 2. Mildly hypermetabolic left hilar lymph nodes are nonspecific. Continued attention on follow-up exams is warranted. 3. Right upper lobe pulmonary nodule shows metabolism after just above blood pool and is therefore indeterminate. Continued attention on follow-up exams is warranted. 4. Aortic atherosclerosis (ICD10-170.0). Coronary artery calcification. 5. Cholelithiasis   Plan is to proceed with 4-6 cycles of carboplatin/ gemzar1 week on and one-week off as patient could not tolerate 2-week on and one week off regimen. Cycle 1 started on 09/22/2017  Interval history-reports on and off penile pain which is intermittent and self-limited.  It is better controlled with Tylenol.  Also reports occasional trouble urinating and there are times when he has to strain in does not get a good urine flow and the other times when his urine flow is relatively smooth.  Appetite is good and weight is stable.  He has some chronic fatigue but denies other new complaints  ECOG PS- 1 Pain scale- 0 Opioid associated constipation- no  Review of systems- Review of Systems  Constitutional: Positive for malaise/fatigue. Negative for chills, fever and weight loss.  HENT: Negative for congestion, ear discharge and nosebleeds.   Eyes: Negative for blurred vision.  Respiratory: Negative for cough, hemoptysis, sputum production, shortness of breath and wheezing.   Cardiovascular: Negative for chest pain, palpitations, orthopnea and claudication.  Gastrointestinal: Negative for abdominal pain, blood in stool, constipation, diarrhea, heartburn, melena, nausea and vomiting.  Genitourinary: Negative for dysuria, flank pain, frequency, hematuria and urgency.       Penile pain and urinary hesitancy  Musculoskeletal: Negative for back pain, joint pain and myalgias.    Skin: Negative for rash.  Neurological: Negative for dizziness, tingling, focal weakness, seizures, weakness and headaches.  Endo/Heme/Allergies: Does not bruise/bleed easily.  Psychiatric/Behavioral: Negative for depression and suicidal ideas. The patient does not have insomnia.       Allergies  Allergen Reactions  . Demerol [Meperidine] Nausea And Vomiting  . Lipitor [Atorvastatin] Swelling  . Sulfa Antibiotics Nausea And Vomiting and Rash     Past Medical History:  Diagnosis Date  . Arthritis   . Benign fibroma of prostate 08/23/2013  . BPH (benign prostatic hyperplasia)   . Calculus of kidney 08/23/2013  . Glaucoma    no drops in 3 mo pressure good, pt denies glaucoma, eye pressure has been measuring alright.  . History of kidney stones   . HLD (hyperlipidemia)   . HOH (hard of hearing)    Left Hearing Aid  . HTN (hypertension) 12/26/2014  . Hypertension   . Hyponatremia 12/26/2014  . Migraines    history of migraines when he was younger.  [name] Restless leg syndrome   . Sinus drainage   . Skin cancer   . Urothelial cancer ([place])    chemo tx's.  [name] UTI (lower urinary tract infection) 12/26/2014  . Vertigo      Past Surgical History:  Procedure Laterality Date  . COLONOSCOPY    . CYSTOSCOPY W/ RETROGRADES Right 01/30/2015   Procedure: CYSTOSCOPY WITH RETROGRADE PYELOGRAM;  Surgeon: [name], MD;  Location: [facility];  Service: Urology;  Laterality: Right;  . CYSTOSCOPY W/ RETROGRADES Bilateral 02/26/2016   Procedure: CYSTOSCOPY WITH RETROGRADE PYELOGRAM;  Surgeon: [name], MD;  Location: [facility];  Service: Urology;  Laterality: Bilateral;  . CYSTOSCOPY W/ URETERAL STENT PLACEMENT Right 08/20/2015   Procedure: CYSTOSCOPY WITH RETROGRADE PYELOGRAM/POSSIBLE URETERAL STENT PLACEMENT/BLADDER BIOPSY;  Surgeon: [name], MD;  Location: [facility];  Service: Urology;  Laterality: Right;  . CYSTOSCOPY W/ URETERAL STENT PLACEMENT Right 09/12/2015   Procedure:  CYSTOSCOPY WITH STENT REPLACEMENT;  Surgeon: [name], MD;  Location: [facility];  Service: Urology;  Laterality: Right;  . CYSTOSCOPY WITH BIOPSY Right 09/12/2015   Procedure: CYSTOSCOPY WITH BLADDER AND URETERAL BIOPSY;  Surgeon: [name], MD;  Location: [facility];  Service: Urology;  Laterality: Right;  . CYSTOSCOPY WITH STENT PLACEMENT Right 01/30/2015   Procedure: CYSTOSCOPY WITH STENT PLACEMENT;  Surgeon: [name], MD;  Location: [facility];  Service: Urology;  Laterality: Right;  . CYSTOSCOPY WITH STENT PLACEMENT Right 12/28/2017   Procedure: [place] WITH STENT Exchange;  Surgeon: [name], MD;  Location: [facility];  Service: Urology;  Laterality: Right;  . CYSTOSCOPY/URETEROSCOPY/HOLMIUM LASER/STENT PLACEMENT Right 08/19/2017   Procedure: CYSTOSCOPY/URETEROSCOPY/HOLMIUM LASER/STENT PLACEMENT;  Surgeon: [name], MD;  Location: [facility];  Service: Urology;  Laterality: Right;  . EYE SURGERY Bilateral    Cataract Extraction with IOL  . goiter removal    . HOLMIUM LASER APPLICATION N/A 0/12/9321   Procedure:  HOLMIUM LASER APPLICATION;  Surgeon: [name], MD;  Location: [facility];  Service: Urology;  Laterality: N/A;  . PORTA CATH INSERTION N/A 09/23/2017   Procedure: PORTA CATH INSERTION;  Surgeon: Algernon Huxley, MD;  Location: [place] [facility];  Service: Cardiovascular;  Laterality: N/A;  . SPERMATOCELECTOMY    . TONSILLECTOMY    . TRANSURETHRAL RESECTION OF BLADDER TUMOR N/A 02/26/2016   Procedure: TRANSURETHRAL RESECTION OF BLADDER TUMOR (TURBT);  Surgeon: [name], MD;  Location: [facility];  Service: Urology;  Laterality: N/A;  . TRANSURETHRAL RESECTION OF BLADDER TUMOR WITH MITOMYCIN-C N/A 09/12/2015  Procedure: TRANSURETHRAL RESECTION OF BLADDER TUMOR ;  Surgeon: [name], MD;  Location: [facility];  Service: Urology;  Laterality: N/A;  . TRANSURETHRAL RESECTION OF BLADDER TUMOR WITH MITOMYCIN-C N/A 03/24/2016   Procedure: TRANSURETHRAL  RESECTION OF BLADDER TUMOR WITH MITOMYCIN-C  (SMALL);  Surgeon: [name], MD;  Location: [facility];  Service: Urology;  Laterality: N/A;  . URETERAL BIOPSY Right 08/19/2017   Procedure: Renal Mass BIOPSY;  Surgeon: [name], MD;  Location: [facility];  Service: Urology;  Laterality: Right;  . URETEROSCOPY Right 01/30/2015   Procedure: URETEROSCOPY/ WITH BIOPSY AND CYTOLOGY BRUSHING;  Surgeon: [name], MD;  Location: [facility];  Service: Urology;  Laterality: Right;  . URETEROSCOPY Right 08/20/2015   Procedure: URETEROSCOPY;  Surgeon: [name], MD;  Location: [facility];  Service: Urology;  Laterality: Right;  . URETEROSCOPY Right 09/12/2015   Procedure: URETEROSCOPY;  Surgeon: [name], MD;  Location: [facility];  Service: Urology;  Laterality: Right;  . URETEROSCOPY Right 02/26/2016   Procedure: URETEROSCOPY;  Surgeon: [name], MD;  Location: [facility];  Service: Urology;  Laterality: Right;    Social History   Socioeconomic History  . Marital status: Married    Spouse name: Not on file  . Number of children: Not on file  . Years of education: Not on file  . Highest education level: Not on file  Occupational History  . Not on file  Social Needs  . Financial resource strain: Not on file  . Food insecurity:    Worry: Not on file    Inability: Not on file  . Transportation needs:    Medical: Not on file    Non-medical: Not on file  Tobacco Use  . Smoking status: Never Smoker  . Smokeless tobacco: Never Used  Substance and Sexual Activity  . Alcohol use: No    Alcohol/week: 0.0 standard drinks  . Drug use: No  . Sexual activity: Not on file  Lifestyle  . Physical activity:    Days per week: Not on file    Minutes per session: Not on file  . Stress: Not on file  Relationships  . Social connections:    Talks on phone: Not on file    Gets together: Not on file    Attends religious service: Not on file    Active member of club or organization: Not  on file    Attends meetings of clubs or organizations: Not on file    Relationship status: Not on file  . Intimate partner violence:    Fear of current or ex partner: Not on file    Emotionally abused: Not on file    Physically abused: Not on file    Forced sexual activity: Not on file  Other Topics Concern  . Not on file  Social History Narrative  . Not on file    Family History  Problem Relation Age of Onset  . Hypertension Mother   . Hypertension Father   . Prostate cancer Brother   . Kidney disease Neg Hx   . Kidney cancer Neg Hx   . Bladder Cancer Neg Hx      Current Outpatient Medications:  .  acetaminophen (TYLENOL) 500 MG tablet, Take 1,000 mg by mouth daily as needed for moderate pain. , Disp: , Rfl:  .  benazepril-hydrochlorthiazide (LOTENSIN HCT) 20-12.5 MG tablet, Take 1 tablet by mouth daily. , Disp: , Rfl:  .  dexamethasone (DECADRON) 4 MG tablet, Take 2 tablets (8 mg total)  by mouth daily. Start the day after carboplatin chemotherapy for 2 days., Disp: 30 tablet, Rfl: 1 .  diazepam (VALIUM) 2 MG tablet, Take 5 mg by mouth at bedtime. , Disp: , Rfl:  .  docusate sodium (COLACE) 100 MG capsule, Take 100 mg by mouth 2 (two) times daily. , Disp: , Rfl:  .  fenofibrate micronized (LOFIBRA) 134 MG capsule, Take 134 mg by mouth daily before breakfast. , Disp: , Rfl:  .  fluticasone (FLONASE) 50 MCG/ACT nasal spray, Place 2 sprays into both nostrils at bedtime., Disp: , Rfl:  .  HYDROcodone-acetaminophen (NORCO/VICODIN) 5-325 MG tablet, Take 1 tablet by mouth every 6 (six) hours as needed for severe pain. (Patient not taking: Reported on 02/09/2018), Disp: 30 tablet, Rfl: 0 .  hydrocortisone cream 1 %, Apply 1 application topically daily as needed for itching., Disp: , Rfl:  .  lidocaine-prilocaine (EMLA) cream, Apply to affected area once (Patient not taking: Reported on 12/21/2017), Disp: 30 g, Rfl: 3 .  LORazepam (ATIVAN) 0.5 MG tablet, Take 1 tablet (0.5 mg total) by  mouth every 6 (six) hours as needed (Nausea or vomiting). (Patient not taking: Reported on 02/09/2018), Disp: 30 tablet, Rfl: 0 .  Multiple Vitamin (MULTIVITAMIN WITH MINERALS) TABS tablet, Take 1 tablet by mouth daily., Disp: , Rfl:  .  ondansetron (ZOFRAN) 8 MG tablet, Take 1 tablet (8 mg total) by mouth 2 (two) times daily as needed for refractory nausea / vomiting. Start on day 3 after carboplatin chemo. (Patient not taking: Reported on 12/29/2017), Disp: 30 tablet, Rfl: 1 .  Potassium 99 MG TABS, Take 99 mg by mouth at bedtime., Disp: , Rfl:  .  prochlorperazine (COMPAZINE) 10 MG tablet, Take 1 tablet (10 mg total) by mouth every 6 (six) hours as needed (Nausea or vomiting). (Patient not taking: Reported on 12/29/2017), Disp: 30 tablet, Rfl: 1 .  psyllium (METAMUCIL) 58.6 % packet, Take 1 packet by mouth daily. , Disp: , Rfl:  .  tamsulosin (FLOMAX) 0.4 MG CAPS capsule, Take 1 capsule (0.4 mg total) by mouth daily. (Patient taking differently: Take 0.4 mg by mouth every evening. ), Disp: 90 capsule, Rfl: 3  Physical exam:  Vitals:   02/23/18 0852  BP: (!) 142/72  Pulse: 71  Resp: 18  Temp: (!) 96 F (35.6 C)  TempSrc: Tympanic  Weight: 198 lb (89.8 kg)   Physical Exam HENT:     Head: Normocephalic and atraumatic.  Eyes:     Pupils: Pupils are equal, round, and reactive to light.  Neck:     Musculoskeletal: Normal range of motion.  Cardiovascular:     Rate and Rhythm: Normal rate and regular rhythm.     Heart sounds: Normal heart sounds.  Pulmonary:     Effort: Pulmonary effort is normal.     Breath sounds: Normal breath sounds.  Abdominal:     General: Bowel sounds are normal.     Palpations: Abdomen is soft.  Skin:    General: Skin is warm and dry.  Neurological:     Mental Status: He is alert and oriented to person, place, and time.      CMP Latest Ref Rng & Units 02/23/2018  Glucose 70 - 99 mg/dL 132(H)  BUN 8 - 23 mg/dL 14  Creatinine 0.61 - 1.24 mg/dL 1.10    Sodium 135 - 145 mmol/L 134(L)  Potassium 3.5 - 5.1 mmol/L 3.8  Chloride 98 - 111 mmol/L 99  CO2 22 - 32 mmol/L  27  Calcium 8.9 - 10.3 mg/dL 9.0  Total Protein 6.5 - 8.1 g/dL 6.7  Total Bilirubin 0.3 - 1.2 mg/dL 0.6  Alkaline Phos 38 - 126 U/L 82  AST 15 - 41 U/L 31  ALT 0 - 44 U/L 18   CBC Latest Ref Rng & Units 02/23/2018  WBC 4.0 - 10.5 K/uL 5.8  Hemoglobin 13.0 - 17.0 g/dL 10.6(L)  Hematocrit 39.0 - 52.0 % 31.4(L)  Platelets 150 - 400 K/uL 218      Assessment and plan- Patient is a 84 y.o. 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  Counts okay to proceed with cycle 6-day 15 of gemcitabine and carboplatin chemotherapy today.  He will be due for repeat scans on 03/08/2018.  He will not received any is on day 3 and he will likely need it every other cycle  I will see him back in 3 weeks time with CBC and CMP for cycle 7-day 1 of gemcitabine and carboplatin and also to discuss the results of his CT scans.  My initial plan was to give him 6 cycles but given that he is tolerating chemotherapy well I would like to continue with until progression or toxicity based on the results of his scans  He has moderate chemo induced anemia which is stable.  Continue to monitor  Penile pain: Etiology unclear likely secondary to his underlying malignancy.  Well controlled with Tylenol continue to monitor   Visit Diagnosis 1. Encounter for antineoplastic chemotherapy   2. 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 Antineoplastic chemotherapy induced anemia      Dr. Randa Evens, MD, MPH [facility] at [facility] [phone] 02/22/2018 1:00 PM

## 2018-02-23 ENCOUNTER — Inpatient Hospital Stay: Payer: Medicare HMO

## 2018-02-23 ENCOUNTER — Other Ambulatory Visit: Payer: Self-pay

## 2018-02-23 ENCOUNTER — Encounter: Payer: Self-pay | Admitting: Oncology

## 2018-02-23 ENCOUNTER — Inpatient Hospital Stay (HOSPITAL_BASED_OUTPATIENT_CLINIC_OR_DEPARTMENT_OTHER): Payer: Medicare HMO | Admitting: Oncology

## 2018-02-23 VITALS — BP 142/72 | HR 71 | Temp 96.0°F | Resp 18 | Wt 198.0 lb

## 2018-02-23 DIAGNOSIS — I10 Essential (primary) hypertension: Secondary | ICD-10-CM | POA: Diagnosis not present

## 2018-02-23 DIAGNOSIS — Z79899 Other long term (current) drug therapy: Secondary | ICD-10-CM

## 2018-02-23 DIAGNOSIS — C669 Malignant neoplasm of unspecified ureter: Secondary | ICD-10-CM

## 2018-02-23 DIAGNOSIS — D6481 Anemia due to antineoplastic chemotherapy: Secondary | ICD-10-CM | POA: Diagnosis not present

## 2018-02-23 DIAGNOSIS — C7801 Secondary malignant neoplasm of right lung: Secondary | ICD-10-CM

## 2018-02-23 DIAGNOSIS — Z5111 Encounter for antineoplastic chemotherapy: Secondary | ICD-10-CM | POA: Diagnosis not present

## 2018-02-23 DIAGNOSIS — C661 Malignant neoplasm of right ureter: Secondary | ICD-10-CM | POA: Diagnosis not present

## 2018-02-23 DIAGNOSIS — N4889 Other specified disorders of penis: Secondary | ICD-10-CM

## 2018-02-23 DIAGNOSIS — Z8551 Personal history of malignant neoplasm of bladder: Secondary | ICD-10-CM

## 2018-02-23 DIAGNOSIS — Z8249 Family history of ischemic heart disease and other diseases of the circulatory system: Secondary | ICD-10-CM

## 2018-02-23 DIAGNOSIS — T451X5A Adverse effect of antineoplastic and immunosuppressive drugs, initial encounter: Secondary | ICD-10-CM

## 2018-02-23 DIAGNOSIS — Z85828 Personal history of other malignant neoplasm of skin: Secondary | ICD-10-CM

## 2018-02-23 LAB — CBC WITH DIFFERENTIAL/PLATELET
Abs Immature Granulocytes: 0.11 10*3/uL — ABNORMAL HIGH (ref 0.00–0.07)
Basophils Absolute: 0 10*3/uL (ref 0.0–0.1)
Basophils Relative: 1 %
Eosinophils Absolute: 0.1 10*3/uL (ref 0.0–0.5)
Eosinophils Relative: 1 %
HCT: 31.4 % — ABNORMAL LOW (ref 39.0–52.0)
Hemoglobin: 10.6 g/dL — ABNORMAL LOW (ref 13.0–17.0)
IMMATURE GRANULOCYTES: 2 %
Lymphocytes Relative: 13 %
Lymphs Abs: 0.7 10*3/uL (ref 0.7–4.0)
MCH: 31.4 pg (ref 26.0–34.0)
MCHC: 33.8 g/dL (ref 30.0–36.0)
MCV: 92.9 fL (ref 80.0–100.0)
Monocytes Absolute: 0.5 10*3/uL (ref 0.1–1.0)
Monocytes Relative: 9 %
Neutro Abs: 4.3 10*3/uL (ref 1.7–7.7)
Neutrophils Relative %: 74 %
Platelets: 218 10*3/uL (ref 150–400)
RBC: 3.38 MIL/uL — ABNORMAL LOW (ref 4.22–5.81)
RDW: 16.5 % — ABNORMAL HIGH (ref 11.5–15.5)
WBC: 5.8 10*3/uL (ref 4.0–10.5)
nRBC: 0 % (ref 0.0–0.2)

## 2018-02-23 LAB — COMPREHENSIVE METABOLIC PANEL
ALT: 18 U/L (ref 0–44)
AST: 31 U/L (ref 15–41)
Albumin: 3.7 g/dL (ref 3.5–5.0)
Alkaline Phosphatase: 82 U/L (ref 38–126)
Anion gap: 8 (ref 5–15)
BUN: 14 mg/dL (ref 8–23)
CO2: 27 mmol/L (ref 22–32)
CREATININE: 1.1 mg/dL (ref 0.61–1.24)
Calcium: 9 mg/dL (ref 8.9–10.3)
Chloride: 99 mmol/L (ref 98–111)
GFR calc Af Amer: >60 mL/min (ref >60)
GFR calc non Af Amer: >60 mL/min (ref >60)
Glucose, Bld: 132 mg/dL — ABNORMAL HIGH (ref 70–99)
Potassium: 3.8 mmol/L (ref 3.5–5.1)
Sodium: 134 mmol/L — ABNORMAL LOW (ref 135–145)
Total Bilirubin: 0.6 mg/dL (ref 0.3–1.2)
Total Protein: 6.7 g/dL (ref 6.5–8.1)

## 2018-02-23 MED ORDER — SODIUM CHLORIDE 0.9 % IV SOLN
Freq: Once | INTRAVENOUS | Status: AC
  Administered 2018-02-23: 10:00:00 via INTRAVENOUS
  Filled 2018-02-23: qty 250

## 2018-02-23 MED ORDER — HEPARIN SOD (PORK) LOCK FLUSH 100 UNIT/ML IV SOLN: 500.0000 [IU] | Freq: Once | INTRAVENOUS | Status: DC | PRN

## 2018-02-23 MED ORDER — PALONOSETRON HCL INJECTION 0.25 MG/5ML
0.2500 mg | Freq: Once | INTRAVENOUS | Status: AC
  Administered 2018-02-23: 0.25 mg via INTRAVENOUS
  Filled 2018-02-23: qty 5

## 2018-02-23 MED ORDER — SODIUM CHLORIDE 0.9 % IV SOLN
160.0000 mg | Freq: Once | INTRAVENOUS | Status: AC
  Administered 2018-02-23: 160 mg via INTRAVENOUS
  Filled 2018-02-23: qty 16

## 2018-02-23 MED ORDER — HEPARIN SOD (PORK) LOCK FLUSH 100 UNIT/ML IV SOLN
500.0000 [IU] | Freq: Once | INTRAVENOUS | Status: AC
  Administered 2018-02-23: 500 [IU] via INTRAVENOUS
  Filled 2018-02-23: qty 5

## 2018-02-23 MED ORDER — SODIUM CHLORIDE 0.9 % IV SOLN
1600.0000 mg | Freq: Once | INTRAVENOUS | Status: AC
  Administered 2018-02-23: 1600 mg via INTRAVENOUS
  Filled 2018-02-23: qty 42

## 2018-02-23 MED ORDER — SODIUM CHLORIDE 0.9% FLUSH
10.0000 mL | Freq: Once | INTRAVENOUS | Status: AC
  Administered 2018-02-23: 10 mL via INTRAVENOUS
  Filled 2018-02-23: qty 10

## 2018-02-23 MED ORDER — DEXAMETHASONE SODIUM PHOSPHATE 10 MG/ML IJ SOLN
10.0000 mg | Freq: Once | INTRAMUSCULAR | Status: AC
  Administered 2018-02-23: 10 mg via INTRAVENOUS
  Filled 2018-02-23: qty 1

## 2018-02-23 NOTE — Progress Notes (Signed)
Here for follow up. Overall feels good " except for pain now and then in my penis" stated feeling weaker than usual. Walks 3-4 x per week one mile he stated.

## 2018-02-23 NOTE — Addendum Note (Signed)
Addended by: Wallis Bamberg A on: 02/23/2018 12:10 PM   Modules accepted: Orders

## 2018-02-24 ENCOUNTER — Inpatient Hospital Stay: Payer: Medicare HMO

## 2018-03-08 ENCOUNTER — Ambulatory Visit
Admission: RE | Admit: 2018-03-08 | Discharge: 2018-03-08 | Disposition: A | Discharge status: Home / Self Care | Payer: Medicare HMO | Source: Ambulatory Visit | Attending: Oncology | Admitting: Oncology

## 2018-03-08 DIAGNOSIS — K8020 Calculus of gallbladder without cholecystitis without obstruction: Secondary | ICD-10-CM | POA: Insufficient documentation

## 2018-03-08 DIAGNOSIS — C669 Malignant neoplasm of unspecified ureter: Secondary | ICD-10-CM | POA: Diagnosis not present

## 2018-03-08 DIAGNOSIS — K5730 Diverticulosis of large intestine without perforation or abscess without bleeding: Secondary | ICD-10-CM | POA: Diagnosis not present

## 2018-03-08 DIAGNOSIS — I700 Atherosclerosis of aorta: Secondary | ICD-10-CM | POA: Diagnosis not present

## 2018-03-08 DIAGNOSIS — R590 Localized enlarged lymph nodes: Secondary | ICD-10-CM | POA: Diagnosis not present

## 2018-03-08 DIAGNOSIS — R918 Other nonspecific abnormal finding of lung field: Secondary | ICD-10-CM | POA: Diagnosis not present

## 2018-03-08 DIAGNOSIS — I2510 Atherosclerotic heart disease of native coronary artery without angina pectoris: Secondary | ICD-10-CM | POA: Diagnosis not present

## 2018-03-08 MED ORDER — IOPAMIDOL (ISOVUE-300) INJECTION 61%
100.0000 mL | Freq: Once | INTRAVENOUS | Status: AC | PRN
  Administered 2018-03-08: 100 mL via INTRAVENOUS

## 2018-03-16 ENCOUNTER — Inpatient Hospital Stay: Payer: Medicare HMO | Attending: Oncology

## 2018-03-16 ENCOUNTER — Inpatient Hospital Stay: Payer: Medicare HMO

## 2018-03-16 ENCOUNTER — Other Ambulatory Visit: Payer: Self-pay

## 2018-03-16 ENCOUNTER — Inpatient Hospital Stay (HOSPITAL_BASED_OUTPATIENT_CLINIC_OR_DEPARTMENT_OTHER): Payer: Medicare HMO | Admitting: Oncology

## 2018-03-16 ENCOUNTER — Encounter: Payer: Self-pay | Admitting: Oncology

## 2018-03-16 VITALS — BP 124/72 | HR 74 | Temp 97.5°F | Resp 16 | Ht 72.0 in | Wt 194.7 lb

## 2018-03-16 DIAGNOSIS — C7801 Secondary malignant neoplasm of right lung: Secondary | ICD-10-CM | POA: Diagnosis not present

## 2018-03-16 DIAGNOSIS — Z8551 Personal history of malignant neoplasm of bladder: Secondary | ICD-10-CM

## 2018-03-16 DIAGNOSIS — Z8249 Family history of ischemic heart disease and other diseases of the circulatory system: Secondary | ICD-10-CM

## 2018-03-16 DIAGNOSIS — Z5111 Encounter for antineoplastic chemotherapy: Secondary | ICD-10-CM

## 2018-03-16 DIAGNOSIS — I10 Essential (primary) hypertension: Secondary | ICD-10-CM | POA: Insufficient documentation

## 2018-03-16 DIAGNOSIS — N4889 Other specified disorders of penis: Secondary | ICD-10-CM | POA: Insufficient documentation

## 2018-03-16 DIAGNOSIS — Z79899 Other long term (current) drug therapy: Secondary | ICD-10-CM

## 2018-03-16 DIAGNOSIS — C661 Malignant neoplasm of right ureter: Secondary | ICD-10-CM | POA: Insufficient documentation

## 2018-03-16 DIAGNOSIS — Z85828 Personal history of other malignant neoplasm of skin: Secondary | ICD-10-CM | POA: Diagnosis not present

## 2018-03-16 DIAGNOSIS — R5383 Other fatigue: Secondary | ICD-10-CM | POA: Diagnosis not present

## 2018-03-16 DIAGNOSIS — C7800 Secondary malignant neoplasm of unspecified lung: Secondary | ICD-10-CM

## 2018-03-16 DIAGNOSIS — Z5189 Encounter for other specified aftercare: Secondary | ICD-10-CM | POA: Diagnosis present

## 2018-03-16 DIAGNOSIS — C669 Malignant neoplasm of unspecified ureter: Secondary | ICD-10-CM

## 2018-03-16 DIAGNOSIS — D6481 Anemia due to antineoplastic chemotherapy: Secondary | ICD-10-CM

## 2018-03-16 DIAGNOSIS — T451X5A Adverse effect of antineoplastic and immunosuppressive drugs, initial encounter: Secondary | ICD-10-CM

## 2018-03-16 LAB — CBC WITH DIFFERENTIAL/PLATELET
ABS IMMATURE GRANULOCYTES: 0.01 10*3/uL (ref 0.00–0.07)
Basophils Absolute: 0 10*3/uL (ref 0.0–0.1)
Basophils Relative: 1 %
Eosinophils Absolute: 0.1 10*3/uL (ref 0.0–0.5)
Eosinophils Relative: 4 %
HCT: 32 % — ABNORMAL LOW (ref 39.0–52.0)
Hemoglobin: 10.7 g/dL — ABNORMAL LOW (ref 13.0–17.0)
Immature Granulocytes: 0 %
Lymphocytes Relative: 18 %
Lymphs Abs: 0.6 10*3/uL — ABNORMAL LOW (ref 0.7–4.0)
MCH: 31.4 pg (ref 26.0–34.0)
MCHC: 33.4 g/dL (ref 30.0–36.0)
MCV: 93.8 fL (ref 80.0–100.0)
MONO ABS: 0.4 10*3/uL (ref 0.1–1.0)
Monocytes Relative: 13 %
NEUTROS ABS: 2.1 10*3/uL (ref 1.7–7.7)
Neutrophils Relative %: 64 %
Platelets: 285 10*3/uL (ref 150–400)
RBC: 3.41 MIL/uL — ABNORMAL LOW (ref 4.22–5.81)
RDW: 15.1 % (ref 11.5–15.5)
WBC: 3.2 10*3/uL — ABNORMAL LOW (ref 4.0–10.5)
nRBC: 0 % (ref 0.0–0.2)

## 2018-03-16 LAB — COMPREHENSIVE METABOLIC PANEL
ALT: 16 U/L (ref 0–44)
AST: 33 U/L (ref 15–41)
Albumin: 3.7 g/dL (ref 3.5–5.0)
Alkaline Phosphatase: 53 U/L (ref 38–126)
Anion gap: 8 (ref 5–15)
BUN: 12 mg/dL (ref 8–23)
CO2: 26 mmol/L (ref 22–32)
Calcium: 8.9 mg/dL (ref 8.9–10.3)
Chloride: 100 mmol/L (ref 98–111)
Creatinine, Ser: 1.16 mg/dL (ref 0.61–1.24)
GFR calc Af Amer: >60 mL/min (ref >60)
GFR calc non Af Amer: 58 mL/min — ABNORMAL LOW (ref >60)
Glucose, Bld: 147 mg/dL — ABNORMAL HIGH (ref 70–99)
Potassium: 3.9 mmol/L (ref 3.5–5.1)
Sodium: 134 mmol/L — ABNORMAL LOW (ref 135–145)
Total Bilirubin: 0.6 mg/dL (ref 0.3–1.2)
Total Protein: 6.4 g/dL — ABNORMAL LOW (ref 6.5–8.1)

## 2018-03-16 MED ORDER — SODIUM CHLORIDE 0.9 % IV SOLN
160.0000 mg | Freq: Once | INTRAVENOUS | Status: AC
  Administered 2018-03-16: 160 mg via INTRAVENOUS
  Filled 2018-03-16: qty 16

## 2018-03-16 MED ORDER — SODIUM CHLORIDE 0.9 % IV SOLN
1600.0000 mg | Freq: Once | INTRAVENOUS | Status: AC
  Administered 2018-03-16: 1600 mg via INTRAVENOUS
  Filled 2018-03-16: qty 26.3

## 2018-03-16 MED ORDER — PALONOSETRON HCL INJECTION 0.25 MG/5ML
0.2500 mg | Freq: Once | INTRAVENOUS | Status: AC
  Administered 2018-03-16: 0.25 mg via INTRAVENOUS
  Filled 2018-03-16: qty 5

## 2018-03-16 MED ORDER — HEPARIN SOD (PORK) LOCK FLUSH 100 UNIT/ML IV SOLN
500.0000 [IU] | Freq: Once | INTRAVENOUS | Status: AC | PRN
  Administered 2018-03-16: 500 [IU]
  Filled 2018-03-16: qty 5

## 2018-03-16 MED ORDER — SODIUM CHLORIDE 0.9 % IV SOLN
Freq: Once | INTRAVENOUS | Status: AC
  Administered 2018-03-16: 11:00:00 via INTRAVENOUS
  Filled 2018-03-16: qty 250

## 2018-03-16 MED ORDER — DEXAMETHASONE SODIUM PHOSPHATE 10 MG/ML IJ SOLN
10.0000 mg | Freq: Once | INTRAMUSCULAR | Status: AC
  Administered 2018-03-16: 10 mg via INTRAVENOUS
  Filled 2018-03-16: qty 1

## 2018-03-16 MED ORDER — SODIUM CHLORIDE 0.9% FLUSH
10.0000 mL | INTRAVENOUS | Status: DC | PRN
  Administered 2018-03-16: 10 mL
  Filled 2018-03-16: qty 10

## 2018-03-16 NOTE — Progress Notes (Signed)
Patient here for treatment check. He reports increasing penile pain since his last chemotherapy treatment. He fell last Sunday due to a child running in front of him.

## 2018-03-17 ENCOUNTER — Inpatient Hospital Stay: Payer: Medicare HMO

## 2018-03-17 DIAGNOSIS — C669 Malignant neoplasm of unspecified ureter: Secondary | ICD-10-CM

## 2018-03-17 DIAGNOSIS — Z5111 Encounter for antineoplastic chemotherapy: Secondary | ICD-10-CM | POA: Diagnosis not present

## 2018-03-17 MED ORDER — PEGFILGRASTIM-CBQV 6 MG/0.6ML ~~LOC~~ SOSY
6.0000 mg | PREFILLED_SYRINGE | Freq: Once | SUBCUTANEOUS | Status: AC
  Administered 2018-03-17: 6 mg via SUBCUTANEOUS

## 2018-03-17 NOTE — Progress Notes (Signed)
Hematology/Oncology Consult note [facility]  [phone] [phone]  Patient Care Team: Idelle Crouch, MD as PCP - General (Internal Medicine)   Name of the patient: Anthony Gonzales  [id number]  1931/10/30   Date of visit: 03/17/18  Diagnosis- Metastatic urothelial carcinoma with possible mets to the obturator node. Indeterminate hilar lymph nodes and LUL lung nodule  Chief complaint/ Reason for visit-on treatment assessment prior to cycle 7-day 1 of carboplatin and gemcitabine  Heme/Onc history: patient is a 85 year old male with past medical history significant for hypertension and long-standing history of superficial bladder cancer for which he sees [name]. He has undergone TURBT as well as ureteroscopy since 2017 along with mitomycin as well in the past. Most recently he underwent CT abdomen on 08/06/2017 which showed a soft tissue mass in the right renal pelvis concerning for upper tract urothelial neoplasm. Soft tissue fullness at the right ureterovesical junction with proximal right hydroureteronephrosis. Several millimeter attenuation lesion in the pancreatic head.  He underwent diagnostic ureteroscopy and was found to have 2 high-grade lesions within his right kidney. There was a nodular high-grade appearing lesion in the right anterior renal pelvis. There was also a second ureteral tumor fungating from the right ureteral orifice extending into the distal ureter also consistent with high-grade invasive urothelial carcinoma. Muscle invasion could not be assessed. He also underwent a CT chest which showed a rounded nodule in the right upper lobe measuring 14 mm concerning for metastases. 2 other 3 mm lesions were also noted in the left upper lobe likely benign  Plan initially was neoadjuvant chemotherapy followed by possible surgery but given the presence of lung lesion patient has been referred to oncology for the  same.  PET/CT on 09/21/17 showed:IMPRESSION: 1. Hypermetabolic right obturator lymph node is most indicative of metastatic disease. 2. Mildly hypermetabolic left hilar lymph nodes are nonspecific. Continued attention on follow-up exams is warranted. 3. Right upper lobe pulmonary nodule shows metabolism after just above blood pool and is therefore indeterminate. Continued attention on follow-up exams is warranted. 4. Aortic atherosclerosis (ICD10-170.0). Coronary artery calcification. 5. Cholelithiasis   Plan is to proceed with 4-6 cycles of carboplatin/ gemzar1 week on and one-week off as patient could not tolerate 2-week on and one week off regimen. Cycle 1 started on 09/22/2017   Interval history-patient had a minor fall while walking in the mall and had a laceration over his left eyebrow.  He has been keeping it clean and dressing get and has been slowly healing.  He continues to have on and off penile pain which comes and goes but is particularly prominent the day he gets chemotherapy.  He occasionally takes pain medications for this  ECOG PS- 1 Pain scale- 0 Opioid associated constipation- no  Review of systems- Review of Systems  Constitutional: Negative for chills, fever, malaise/fatigue and weight loss.  HENT: Negative for congestion, ear discharge and nosebleeds.   Eyes: Negative for blurred vision.  Respiratory: Negative for cough, hemoptysis, sputum production, shortness of breath and wheezing.   Cardiovascular: Negative for chest pain, palpitations, orthopnea and claudication.  Gastrointestinal: Negative for abdominal pain, blood in stool, constipation, diarrhea, heartburn, melena, nausea and vomiting.  Genitourinary: Negative for dysuria, flank pain, frequency, hematuria and urgency.  Musculoskeletal: Negative for back pain, joint pain and myalgias.  Skin: Negative for rash.  Neurological: Negative for dizziness, tingling, focal weakness, seizures, weakness and  headaches.  Endo/Heme/Allergies: Does not bruise/bleed easily.  Psychiatric/Behavioral: Negative for  depression and suicidal ideas. The patient does not have insomnia.       Allergies  Allergen Reactions  . Demerol [Meperidine] Nausea And Vomiting  . Lipitor [Atorvastatin] Swelling  . Sulfa Antibiotics Nausea And Vomiting and Rash     Past Medical History:  Diagnosis Date  . Arthritis   . Benign fibroma of prostate 08/23/2013  . BPH (benign prostatic hyperplasia)   . Calculus of kidney 08/23/2013  . Glaucoma    no drops in 3 mo pressure good, pt denies glaucoma, eye pressure has been measuring alright.  . History of kidney stones   . HLD (hyperlipidemia)   . HOH (hard of hearing)    Left Hearing Aid  . HTN (hypertension) 12/26/2014  . Hypertension   . Hyponatremia 12/26/2014  . Migraines    history of migraines when he was younger.  [name] Restless leg syndrome   . Sinus drainage   . Skin cancer   . Urothelial cancer ([place])    chemo tx's.  [name] UTI (lower urinary tract infection) 12/26/2014  . Vertigo      Past Surgical History:  Procedure Laterality Date  . COLONOSCOPY    . CYSTOSCOPY W/ RETROGRADES Right 01/30/2015   Procedure: CYSTOSCOPY WITH RETROGRADE PYELOGRAM;  Surgeon: [name], MD;  Location: [facility];  Service: Urology;  Laterality: Right;  . CYSTOSCOPY W/ RETROGRADES Bilateral 02/26/2016   Procedure: CYSTOSCOPY WITH RETROGRADE PYELOGRAM;  Surgeon: [name], MD;  Location: [facility];  Service: Urology;  Laterality: Bilateral;  . CYSTOSCOPY W/ URETERAL STENT PLACEMENT Right 08/20/2015   Procedure: CYSTOSCOPY WITH RETROGRADE PYELOGRAM/POSSIBLE URETERAL STENT PLACEMENT/BLADDER BIOPSY;  Surgeon: [name], MD;  Location: [facility];  Service: Urology;  Laterality: Right;  . CYSTOSCOPY W/ URETERAL STENT PLACEMENT Right 09/12/2015   Procedure: CYSTOSCOPY WITH STENT REPLACEMENT;  Surgeon: [name], MD;  Location: [facility];  Service: Urology;  Laterality:  Right;  . CYSTOSCOPY WITH BIOPSY Right 09/12/2015   Procedure: CYSTOSCOPY WITH BLADDER AND URETERAL BIOPSY;  Surgeon: [name], MD;  Location: [facility];  Service: Urology;  Laterality: Right;  . CYSTOSCOPY WITH STENT PLACEMENT Right 01/30/2015   Procedure: CYSTOSCOPY WITH STENT PLACEMENT;  Surgeon: [name], MD;  Location: [facility];  Service: Urology;  Laterality: Right;  . CYSTOSCOPY WITH STENT PLACEMENT Right 12/28/2017   Procedure: [place] WITH STENT Exchange;  Surgeon: [name], MD;  Location: [facility];  Service: Urology;  Laterality: Right;  . CYSTOSCOPY/URETEROSCOPY/HOLMIUM LASER/STENT PLACEMENT Right 08/19/2017   Procedure: CYSTOSCOPY/URETEROSCOPY/HOLMIUM LASER/STENT PLACEMENT;  Surgeon: [name], MD;  Location: [facility];  Service: Urology;  Laterality: Right;  . EYE SURGERY Bilateral    Cataract Extraction with IOL  . goiter removal    . HOLMIUM LASER APPLICATION N/A 0/17/5102   Procedure:  HOLMIUM LASER APPLICATION;  Surgeon: [name], MD;  Location: [facility];  Service: Urology;  Laterality: N/A;  . PORTA CATH INSERTION N/A 09/23/2017   Procedure: PORTA CATH INSERTION;  Surgeon: Algernon Huxley, MD;  Location: [facility];  Service: Cardiovascular;  Laterality: N/A;  . SPERMATOCELECTOMY    . TONSILLECTOMY    . TRANSURETHRAL RESECTION OF BLADDER TUMOR N/A 02/26/2016   Procedure: TRANSURETHRAL RESECTION OF BLADDER TUMOR (TURBT);  Surgeon: [name], MD;  Location: [facility];  Service: Urology;  Laterality: N/A;  . TRANSURETHRAL RESECTION OF BLADDER TUMOR WITH MITOMYCIN-C N/A 09/12/2015   Procedure: TRANSURETHRAL RESECTION OF BLADDER TUMOR ;  Surgeon: [name], MD;  Location: [facility];  Service: Urology;  Laterality:  N/A;  . TRANSURETHRAL RESECTION OF BLADDER TUMOR WITH MITOMYCIN-C N/A 03/24/2016   Procedure: TRANSURETHRAL RESECTION OF BLADDER TUMOR WITH MITOMYCIN-C  (SMALL);  Surgeon: [name], MD;  Location: [facility];  Service: Urology;   Laterality: N/A;  . URETERAL BIOPSY Right 08/19/2017   Procedure: Renal Mass BIOPSY;  Surgeon: [name], MD;  Location: [facility];  Service: Urology;  Laterality: Right;  . URETEROSCOPY Right 01/30/2015   Procedure: URETEROSCOPY/ WITH BIOPSY AND CYTOLOGY BRUSHING;  Surgeon: [name], MD;  Location: [facility];  Service: Urology;  Laterality: Right;  . URETEROSCOPY Right 08/20/2015   Procedure: URETEROSCOPY;  Surgeon: [name], MD;  Location: [facility];  Service: Urology;  Laterality: Right;  . URETEROSCOPY Right 09/12/2015   Procedure: URETEROSCOPY;  Surgeon: [name], MD;  Location: [facility];  Service: Urology;  Laterality: Right;  . URETEROSCOPY Right 02/26/2016   Procedure: URETEROSCOPY;  Surgeon: [name], MD;  Location: [facility];  Service: Urology;  Laterality: Right;    Social History   Socioeconomic History  . Marital status: Married    Spouse name: Not on file  . Number of children: Not on file  . Years of education: Not on file  . Highest education level: Not on file  Occupational History  . Not on file  Social Needs  . Financial resource strain: Not on file  . Food insecurity:    Worry: Not on file    Inability: Not on file  . Transportation needs:    Medical: Not on file    Non-medical: Not on file  Tobacco Use  . Smoking status: Never Smoker  . Smokeless tobacco: Never Used  Substance and Sexual Activity  . Alcohol use: No    Alcohol/week: 0.0 standard drinks  . Drug use: No  . Sexual activity: Not on file  Lifestyle  . Physical activity:    Days per week: Not on file    Minutes per session: Not on file  . Stress: Not on file  Relationships  . Social connections:    Talks on phone: Not on file    Gets together: Not on file    Attends religious service: Not on file    Active member of club or organization: Not on file    Attends meetings of clubs or organizations: Not on file    Relationship status: Not on file  . Intimate partner  violence:    Fear of current or ex partner: Not on file    Emotionally abused: Not on file    Physically abused: Not on file    Forced sexual activity: Not on file  Other Topics Concern  . Not on file  Social History Narrative  . Not on file    Family History  Problem Relation Age of Onset  . Hypertension Mother   . Hypertension Father   . Prostate cancer Brother   . Kidney disease Neg Hx   . Kidney cancer Neg Hx   . Bladder Cancer Neg Hx      Current Outpatient Medications:  .  acetaminophen (TYLENOL) 500 MG tablet, Take 1,000 mg by mouth daily as needed for moderate pain. , Disp: , Rfl:  .  benazepril-hydrochlorthiazide (LOTENSIN HCT) 20-12.5 MG tablet, Take 1 tablet by mouth daily. , Disp: , Rfl:  .  dexamethasone (DECADRON) 4 MG tablet, Take 2 tablets (8 mg total) by mouth daily. Start the day after carboplatin chemotherapy for 2 days., Disp: 30 tablet, Rfl: 1 .  diazepam (VALIUM)  2 MG tablet, Take 5 mg by mouth at bedtime. , Disp: , Rfl:  .  docusate sodium (COLACE) 100 MG capsule, Take 100 mg by mouth 2 (two) times daily. , Disp: , Rfl:  .  fenofibrate micronized (LOFIBRA) 134 MG capsule, Take 134 mg by mouth daily before breakfast. , Disp: , Rfl:  .  fluticasone (FLONASE) 50 MCG/ACT nasal spray, Place 2 sprays into both nostrils at bedtime., Disp: , Rfl:  .  HYDROcodone-acetaminophen (NORCO/VICODIN) 5-325 MG tablet, Take 1 tablet by mouth every 6 (six) hours as needed for severe pain., Disp: 30 tablet, Rfl: 0 .  hydrocortisone cream 1 %, Apply 1 application topically daily as needed for itching., Disp: , Rfl:  .  lidocaine-prilocaine (EMLA) cream, Apply to affected area once, Disp: 30 g, Rfl: 3 .  LORazepam (ATIVAN) 0.5 MG tablet, Take 1 tablet (0.5 mg total) by mouth every 6 (six) hours as needed (Nausea or vomiting)., Disp: 30 tablet, Rfl: 0 .  Multiple Vitamin (MULTIVITAMIN WITH MINERALS) TABS tablet, Take 1 tablet by mouth daily., Disp: , Rfl:  .  ondansetron (ZOFRAN) 8  MG tablet, Take 1 tablet (8 mg total) by mouth 2 (two) times daily as needed for refractory nausea / vomiting. Start on day 3 after carboplatin chemo., Disp: 30 tablet, Rfl: 1 .  Potassium 99 MG TABS, Take 99 mg by mouth at bedtime., Disp: , Rfl:  .  prochlorperazine (COMPAZINE) 10 MG tablet, Take 1 tablet (10 mg total) by mouth every 6 (six) hours as needed (Nausea or vomiting)., Disp: 30 tablet, Rfl: 1 .  psyllium (METAMUCIL) 58.6 % packet, Take 1 packet by mouth daily. , Disp: , Rfl:  .  tamsulosin (FLOMAX) 0.4 MG CAPS capsule, Take 1 capsule (0.4 mg total) by mouth daily. (Patient taking differently: Take 0.4 mg by mouth every evening. ), Disp: 90 capsule, Rfl: 3  Physical exam:  Vitals:   03/16/18 0950 03/16/18 0957  BP:  124/72  Pulse:  74  Resp: 16   Temp:  (!) 97.5 F (36.4 C)  Weight: 194 lb 11.2 oz (88.3 kg)   Height: 6' (1.829 m)    Physical Exam Constitutional:      General: He is not in acute distress. HENT:     Head: Normocephalic and atraumatic.     Comments: As she had laceration noted over left forehead which is well opposed and healing well Eyes:     Pupils: Pupils are equal, round, and reactive to light.  Neck:     Musculoskeletal: Normal range of motion.  Cardiovascular:     Rate and Rhythm: Normal rate and regular rhythm.     Heart sounds: Normal heart sounds.  Pulmonary:     Effort: Pulmonary effort is normal.     Breath sounds: Normal breath sounds.  Abdominal:     General: Bowel sounds are normal.     Palpations: Abdomen is soft.  Skin:    General: Skin is warm and dry.  Neurological:     Mental Status: He is alert and oriented to person, place, and time.      CMP Latest Ref Rng & Units 03/16/2018  Glucose 70 - 99 mg/dL 147(H)  BUN 8 - 23 mg/dL 12  Creatinine 0.61 - 1.24 mg/dL 1.16  Sodium 135 - 145 mmol/L 134(L)  Potassium 3.5 - 5.1 mmol/L 3.9  Chloride 98 - 111 mmol/L 100  CO2 22 - 32 mmol/L 26  Calcium 8.9 - 10.3 mg/dL 8.9  Total  Protein  6.5 - 8.1 g/dL 6.4(L)  Total Bilirubin 0.3 - 1.2 mg/dL 0.6  Alkaline Phos 38 - 126 U/L 53  AST 15 - 41 U/L 33  ALT 0 - 44 U/L 16   CBC Latest Ref Rng & Units 03/16/2018  WBC 4.0 - 10.5 K/uL 3.2(L)  Hemoglobin 13.0 - 17.0 g/dL 10.7(L)  Hematocrit 39.0 - 52.0 % 32.0(L)  Platelets 150 - 400 K/uL 285    No images are attached to the encounter.  Ct Chest W Contrast  Result Date: 03/08/2018 CLINICAL DATA:  85 year old male with history of urothelial carcinoma of the distal ureter. Follow-up study. EXAM: CT CHEST, ABDOMEN, AND PELVIS WITH CONTRAST TECHNIQUE: Multidetector CT imaging of the chest, abdomen and pelvis was performed following the standard protocol during bolus administration of intravenous contrast. CONTRAST:  134mL ISOVUE-300 IOPAMIDOL (ISOVUE-300) INJECTION 61% COMPARISON:  CT the chest, abdomen and pelvis 12/09/2017. FINDINGS: CT CHEST FINDINGS Cardiovascular: Heart size is normal. There is no significant pericardial fluid, thickening or pericardial calcification. There is aortic atherosclerosis, as well as atherosclerosis of the great vessels of the mediastinum and the coronary arteries, including calcified atherosclerotic plaque in the left anterior descending coronary artery. Right internal jugular Port-A-Cath with tip terminating in the distal superior vena cava. Mediastinum/Nodes: No pathologically enlarged mediastinal or hilar lymph nodes. Esophagus is unremarkable in appearance. No axillary lymphadenopathy. Lungs/Pleura: Previously noted pulmonary nodules are generally similar to the prior study, with the largest of these in the right upper lobe medially (axial image 68 of series 3) measuring 10 x 14 mm, unchanged. 3 mm nodule in the left upper lobe (axial image 60 of series 3) and 2 mm nodule in the superior segment of the left lower lobe (axial image 74 of series 3) are stable compared to the prior study. New 3 mm nodule in the superior segment of the left lower lobe (axial image 76  of series 3). No acute consolidative airspace disease. No pleural effusions. Musculoskeletal: There are no aggressive appearing lytic or blastic lesions noted in the visualized portions of the skeleton. CT ABDOMEN PELVIS FINDINGS Hepatobiliary: No suspicious cystic or solid hepatic lesions. No intra or extrahepatic biliary ductal dilatation. Small calcified gallstones lying dependently in the gallbladder. No findings to suggest an acute cholecystitis at this time. Pancreas: 11 x 7 mm lesion in the inferior aspect of the pancreatic head (axial image 78 of series 2), stable compared to the prior study. No other suspicious appearing pancreatic mass. No pancreatic ductal dilatation. No pancreatic or peripancreatic fluid or inflammatory changes. Spleen: Unremarkable. Adrenals/Urinary Tract: Right-sided double-J ureteral stent in position with the proximal loop reformed in the right renal pelvis and distal loop reformed in the urinary bladder. In the right renal pelvis there are 2 areas of abnormal urothelial enhancement. The largest of these is a mass-like area in the upper pole measuring 2.6 x 1.5 x 1.7 cm (coronal image 79 of series 4 and axial image 71 of series 2). The smaller lesion is in the interpolar region measuring 1.4 x 1.8 x 1.6 cm (coronal image 74 of series 4 and axial image 75 of series 2). These both appear slightly larger when compared to the prior study. 4.4 cm exophytic simple cyst in the lower pole of the right kidney. Subcentimeter low-attenuation lesion in the interpolar region of the left kidney, too small to characterize, but similar to prior studies, statistically likely a cyst. No hydroureteronephrosis. No definite urothelial mass noted along the distal course of the right  ureter on today's examination. Along the posterolateral wall of the urinary bladder near the base there is a small 1.7 cm left-sided diverticulum. Urinary bladder is otherwise unremarkable in appearance. Bilateral adrenal  glands are normal in appearance. Stomach/Bowel: Normal appearance of the stomach. No pathologic dilatation of small bowel or colon. A few scattered colonic diverticulae are noted, without surrounding inflammatory changes to suggest an acute diverticulitis at this time. Normal appendix. Vascular/Lymphatic: Aortic atherosclerosis, without evidence of aneurysm or dissection in the abdominal or pelvic vasculature. Previously noted mildly enlarged and enhancing right obturator lymph node is similar to the prior study measuring 11 mm in short axis (axial image 112 of series 2). No new lymphadenopathy noted in the abdomen or pelvis. Reproductive: Prostate gland and seminal vesicles are unremarkable in appearance. Other: No significant volume of ascites.  No pneumoperitoneum. Musculoskeletal: There are no aggressive appearing lytic or blastic lesions noted in the visualized portions of the skeleton. IMPRESSION: 1. Slight progressive enlargement of 2 upper tract urothelial lesions in the right renal collecting system, as above. No distal right ureteral lesion identified on today's examination. 2. Previously noted pulmonary nodules generally appear stable in size compared to the prior study. However, there is a new 3 mm nodule in the superior segment of the left lower lobe (axial image 76 of series 3). Close attention at time of future follow-up examinations is recommended to ensure the stability or resolution of this new lesion. 3. Right obturator lymphadenopathy is stable compared to the prior study. 4. No other sites of potential metastatic disease noted elsewhere in the chest, abdomen or pelvis. 5. Right-sided double-J ureteral stent appears appropriately positioned. No current hydroureteronephrosis. 6. Colonic diverticulosis without evidence of acute diverticulitis at this time. 7. Cholelithiasis without evidence of acute cholecystitis at this time. 8. Aortic atherosclerosis, in addition to left anterior descending  coronary artery disease. 9. Additional incidental findings, as above. Electronically Signed   By: Vinnie Langton M.D.   On: 03/08/2018 11:29   Ct Abdomen Pelvis W Contrast  Result Date: 03/08/2018 CLINICAL DATA:  85 year old male with history of urothelial carcinoma of the distal ureter. Follow-up study. EXAM: CT CHEST, ABDOMEN, AND PELVIS WITH CONTRAST TECHNIQUE: Multidetector CT imaging of the chest, abdomen and pelvis was performed following the standard protocol during bolus administration of intravenous contrast. CONTRAST:  165mL ISOVUE-300 IOPAMIDOL (ISOVUE-300) INJECTION 61% COMPARISON:  CT the chest, abdomen and pelvis 12/09/2017. FINDINGS: CT CHEST FINDINGS Cardiovascular: Heart size is normal. There is no significant pericardial fluid, thickening or pericardial calcification. There is aortic atherosclerosis, as well as atherosclerosis of the great vessels of the mediastinum and the coronary arteries, including calcified atherosclerotic plaque in the left anterior descending coronary artery. Right internal jugular Port-A-Cath with tip terminating in the distal superior vena cava. Mediastinum/Nodes: No pathologically enlarged mediastinal or hilar lymph nodes. Esophagus is unremarkable in appearance. No axillary lymphadenopathy. Lungs/Pleura: Previously noted pulmonary nodules are generally similar to the prior study, with the largest of these in the right upper lobe medially (axial image 68 of series 3) measuring 10 x 14 mm, unchanged. 3 mm nodule in the left upper lobe (axial image 60 of series 3) and 2 mm nodule in the superior segment of the left lower lobe (axial image 74 of series 3) are stable compared to the prior study. New 3 mm nodule in the superior segment of the left lower lobe (axial image 76 of series 3). No acute consolidative airspace disease. No pleural effusions. Musculoskeletal: There are no aggressive  appearing lytic or blastic lesions noted in the visualized portions of the  skeleton. CT ABDOMEN PELVIS FINDINGS Hepatobiliary: No suspicious cystic or solid hepatic lesions. No intra or extrahepatic biliary ductal dilatation. Small calcified gallstones lying dependently in the gallbladder. No findings to suggest an acute cholecystitis at this time. Pancreas: 11 x 7 mm lesion in the inferior aspect of the pancreatic head (axial image 78 of series 2), stable compared to the prior study. No other suspicious appearing pancreatic mass. No pancreatic ductal dilatation. No pancreatic or peripancreatic fluid or inflammatory changes. Spleen: Unremarkable. Adrenals/Urinary Tract: Right-sided double-J ureteral stent in position with the proximal loop reformed in the right renal pelvis and distal loop reformed in the urinary bladder. In the right renal pelvis there are 2 areas of abnormal urothelial enhancement. The largest of these is a mass-like area in the upper pole measuring 2.6 x 1.5 x 1.7 cm (coronal image 79 of series 4 and axial image 71 of series 2). The smaller lesion is in the interpolar region measuring 1.4 x 1.8 x 1.6 cm (coronal image 74 of series 4 and axial image 75 of series 2). These both appear slightly larger when compared to the prior study. 4.4 cm exophytic simple cyst in the lower pole of the right kidney. Subcentimeter low-attenuation lesion in the interpolar region of the left kidney, too small to characterize, but similar to prior studies, statistically likely a cyst. No hydroureteronephrosis. No definite urothelial mass noted along the distal course of the right ureter on today's examination. Along the posterolateral wall of the urinary bladder near the base there is a small 1.7 cm left-sided diverticulum. Urinary bladder is otherwise unremarkable in appearance. Bilateral adrenal glands are normal in appearance. Stomach/Bowel: Normal appearance of the stomach. No pathologic dilatation of small bowel or colon. A few scattered colonic diverticulae are noted, without  surrounding inflammatory changes to suggest an acute diverticulitis at this time. Normal appendix. Vascular/Lymphatic: Aortic atherosclerosis, without evidence of aneurysm or dissection in the abdominal or pelvic vasculature. Previously noted mildly enlarged and enhancing right obturator lymph node is similar to the prior study measuring 11 mm in short axis (axial image 112 of series 2). No new lymphadenopathy noted in the abdomen or pelvis. Reproductive: Prostate gland and seminal vesicles are unremarkable in appearance. Other: No significant volume of ascites.  No pneumoperitoneum. Musculoskeletal: There are no aggressive appearing lytic or blastic lesions noted in the visualized portions of the skeleton. IMPRESSION: 1. Slight progressive enlargement of 2 upper tract urothelial lesions in the right renal collecting system, as above. No distal right ureteral lesion identified on today's examination. 2. Previously noted pulmonary nodules generally appear stable in size compared to the prior study. However, there is a new 3 mm nodule in the superior segment of the left lower lobe (axial image 76 of series 3). Close attention at time of future follow-up examinations is recommended to ensure the stability or resolution of this new lesion. 3. Right obturator lymphadenopathy is stable compared to the prior study. 4. No other sites of potential metastatic disease noted elsewhere in the chest, abdomen or pelvis. 5. Right-sided double-J ureteral stent appears appropriately positioned. No current hydroureteronephrosis. 6. Colonic diverticulosis without evidence of acute diverticulitis at this time. 7. Cholelithiasis without evidence of acute cholecystitis at this time. 8. Aortic atherosclerosis, in addition to left anterior descending coronary artery disease. 9. Additional incidental findings, as above. Electronically Signed   By: Vinnie Langton M.D.   On: 03/08/2018 11:29  Assessment and plan- Patient is a 85 y.o.  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 He is here for on treatment assessment prior to cycle 7-day 1 of gemcitabine and carboplatin  I have reviewed CT chest abdomen and pelvis images independently.  Overall his pulmonary nodules are stable although that is 1 new 3 mm left lung nodule that is seen.  There is slight increase in the size of his primary renal pelvis tumor as well.  Obturator lymph node size is stable.  There are no new areas of metastatic disease.  I am inclined to continue present treatment at this time given that overall his disease is stable and he is tolerating his present chemotherapy regimen well.  Counts have therefore okay to proceed with cycle 7-day 1 of gemcitabine and carboplatin today.  He will return tomorrow to receive his Neulasta.  I will see him back in 2 weeks time for cycle 7-day 15 of gemcitabine and carboplatin  I will get a short-term scan likely in 2 months time and if there is continued progression of his primary tumor and lung nodules I will switch him to immunotherapy at that time   Visit Diagnosis 1. 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[place])   2. Encounter for antineoplastic chemotherapy   3. Malignant neoplasm metastatic to lung, unspecified laterality ([place])   4. Anemia due to antineoplastic chemotherapy      Dr. Randa Evens, MD, MPH [facility] at [facility] [phone] 03/17/2018 10:19 AM

## 2018-03-18 ENCOUNTER — Other Ambulatory Visit: Payer: Self-pay

## 2018-03-18 DIAGNOSIS — R3914 Feeling of incomplete bladder emptying: Principal | ICD-10-CM

## 2018-03-18 DIAGNOSIS — N401 Enlarged prostate with lower urinary tract symptoms: Secondary | ICD-10-CM

## 2018-03-18 MED ORDER — TAMSULOSIN HCL 0.4 MG PO CAPS: 0.4000 mg | ORAL_CAPSULE | Freq: Every day | ORAL | 3 refills | Status: DC

## 2018-03-29 ENCOUNTER — Other Ambulatory Visit: Payer: Self-pay | Admitting: *Deleted

## 2018-03-29 ENCOUNTER — Other Ambulatory Visit: Payer: Self-pay | Admitting: Oncology

## 2018-03-30 ENCOUNTER — Inpatient Hospital Stay: Payer: Medicare HMO

## 2018-03-30 ENCOUNTER — Encounter: Payer: Self-pay | Admitting: Oncology

## 2018-03-30 ENCOUNTER — Inpatient Hospital Stay: Payer: Medicare HMO | Admitting: Oncology

## 2018-03-30 VITALS — BP 125/81 | HR 87 | Temp 98.2°F | Resp 18 | Ht 72.0 in | Wt 195.8 lb

## 2018-03-30 DIAGNOSIS — I10 Essential (primary) hypertension: Secondary | ICD-10-CM | POA: Diagnosis not present

## 2018-03-30 DIAGNOSIS — C669 Malignant neoplasm of unspecified ureter: Secondary | ICD-10-CM

## 2018-03-30 DIAGNOSIS — Z85828 Personal history of other malignant neoplasm of skin: Secondary | ICD-10-CM

## 2018-03-30 DIAGNOSIS — C7801 Secondary malignant neoplasm of right lung: Secondary | ICD-10-CM | POA: Diagnosis not present

## 2018-03-30 DIAGNOSIS — N4889 Other specified disorders of penis: Secondary | ICD-10-CM

## 2018-03-30 DIAGNOSIS — Z5111 Encounter for antineoplastic chemotherapy: Secondary | ICD-10-CM | POA: Diagnosis not present

## 2018-03-30 DIAGNOSIS — Z8249 Family history of ischemic heart disease and other diseases of the circulatory system: Secondary | ICD-10-CM

## 2018-03-30 DIAGNOSIS — C661 Malignant neoplasm of right ureter: Secondary | ICD-10-CM

## 2018-03-30 DIAGNOSIS — Z8551 Personal history of malignant neoplasm of bladder: Secondary | ICD-10-CM

## 2018-03-30 DIAGNOSIS — R5383 Other fatigue: Secondary | ICD-10-CM

## 2018-03-30 DIAGNOSIS — D6481 Anemia due to antineoplastic chemotherapy: Secondary | ICD-10-CM | POA: Diagnosis not present

## 2018-03-30 DIAGNOSIS — Z79899 Other long term (current) drug therapy: Secondary | ICD-10-CM

## 2018-03-30 LAB — COMPREHENSIVE METABOLIC PANEL
ALT: 18 U/L (ref 0–44)
AST: 27 U/L (ref 15–41)
Albumin: 3.8 g/dL (ref 3.5–5.0)
Alkaline Phosphatase: 88 U/L (ref 38–126)
Anion gap: 7 (ref 5–15)
BUN: 16 mg/dL (ref 8–23)
CHLORIDE: 98 mmol/L (ref 98–111)
CO2: 27 mmol/L (ref 22–32)
Calcium: 8.9 mg/dL (ref 8.9–10.3)
Creatinine, Ser: 1.09 mg/dL (ref 0.61–1.24)
GFR calc Af Amer: >60 mL/min (ref >60)
GFR calc non Af Amer: >60 mL/min (ref >60)
Glucose, Bld: 142 mg/dL — ABNORMAL HIGH (ref 70–99)
Potassium: 3.9 mmol/L (ref 3.5–5.1)
SODIUM: 132 mmol/L — AB (ref 135–145)
Total Bilirubin: 0.6 mg/dL (ref 0.3–1.2)
Total Protein: 6.8 g/dL (ref 6.5–8.1)

## 2018-03-30 LAB — CBC WITH DIFFERENTIAL/PLATELET
ABS IMMATURE GRANULOCYTES: 0.04 10*3/uL (ref 0.00–0.07)
BASOS PCT: 1 %
Basophils Absolute: 0 10*3/uL (ref 0.0–0.1)
Eosinophils Absolute: 0.1 10*3/uL (ref 0.0–0.5)
Eosinophils Relative: 2 %
HCT: 33.9 % — ABNORMAL LOW (ref 39.0–52.0)
Hemoglobin: 11.3 g/dL — ABNORMAL LOW (ref 13.0–17.0)
Immature Granulocytes: 1 %
Lymphocytes Relative: 13 %
Lymphs Abs: 0.9 10*3/uL (ref 0.7–4.0)
MCH: 30.7 pg (ref 26.0–34.0)
MCHC: 33.3 g/dL (ref 30.0–36.0)
MCV: 92.1 fL (ref 80.0–100.0)
Monocytes Absolute: 0.6 10*3/uL (ref 0.1–1.0)
Monocytes Relative: 9 %
NEUTROS ABS: 5.3 10*3/uL (ref 1.7–7.7)
Neutrophils Relative %: 74 %
PLATELETS: 299 10*3/uL (ref 150–400)
RBC: 3.68 MIL/uL — ABNORMAL LOW (ref 4.22–5.81)
RDW: 15.8 % — ABNORMAL HIGH (ref 11.5–15.5)
WBC: 6.9 10*3/uL (ref 4.0–10.5)
nRBC: 0 % (ref 0.0–0.2)

## 2018-03-30 MED ORDER — SODIUM CHLORIDE 0.9 % IV SOLN
Freq: Once | INTRAVENOUS | Status: AC
  Administered 2018-03-30: 10:00:00 via INTRAVENOUS
  Filled 2018-03-30: qty 250

## 2018-03-30 MED ORDER — DEXAMETHASONE SODIUM PHOSPHATE 10 MG/ML IJ SOLN
10.0000 mg | Freq: Once | INTRAMUSCULAR | Status: AC
  Administered 2018-03-30: 10 mg via INTRAVENOUS
  Filled 2018-03-30: qty 1

## 2018-03-30 MED ORDER — HEPARIN SOD (PORK) LOCK FLUSH 100 UNIT/ML IV SOLN
500.0000 [IU] | Freq: Once | INTRAVENOUS | Status: AC
  Administered 2018-03-30: 500 [IU] via INTRAVENOUS
  Filled 2018-03-30: qty 5

## 2018-03-30 MED ORDER — SODIUM CHLORIDE 0.9 % IV SOLN
1600.0000 mg | Freq: Once | INTRAVENOUS | Status: AC
  Administered 2018-03-30: 1600 mg via INTRAVENOUS
  Filled 2018-03-30: qty 42

## 2018-03-30 MED ORDER — SODIUM CHLORIDE 0.9 % IV SOLN
160.0000 mg | Freq: Once | INTRAVENOUS | Status: AC
  Administered 2018-03-30: 160 mg via INTRAVENOUS
  Filled 2018-03-30: qty 16

## 2018-03-30 MED ORDER — SODIUM CHLORIDE 0.9% FLUSH
10.0000 mL | Freq: Once | INTRAVENOUS | Status: AC
  Administered 2018-03-30: 10 mL via INTRAVENOUS
  Filled 2018-03-30: qty 10

## 2018-03-30 NOTE — Progress Notes (Signed)
No problems except for the penis pain that comes mostly when he urinates and usually goes away but sometimes it takes a while to get better and sometimes it is just a minute. He starts with taking tylenol and then will take hydrocodone if needed

## 2018-03-30 NOTE — Progress Notes (Signed)
Hematology/Oncology Consult note [facility]  Telephone:([phone] [phone]  Patient Care Team: Idelle Crouch, MD as PCP - General (Internal Medicine)   Name of the patient: Anthony Gonzales  [id number]  1931-06-04   Date of visit: 03/30/18  Diagnosis- Metastatic urothelial carcinoma with possible mets to the obturator node. Indeterminate hilar lymph nodes and LUL lung nodule  Chief complaint/ Reason for visit-on treatment assessment prior to cycle 7-day 8 of carboplatin and gemcitabine  Heme/Onc history: patient is a 85 year old male with past medical history significant for hypertension and long-standing history of superficial bladder cancer for which he sees [name]. He has undergone TURBT as well as ureteroscopy since 2017 along with mitomycin as well in the past. Most recently he underwent CT abdomen on 08/06/2017 which showed a soft tissue mass in the right renal pelvis concerning for upper tract urothelial neoplasm. Soft tissue fullness at the right ureterovesical junction with proximal right hydroureteronephrosis. Several millimeter attenuation lesion in the pancreatic head.  He underwent diagnostic ureteroscopy and was found to have 2 high-grade lesions within his right kidney. There was a nodular high-grade appearing lesion in the right anterior renal pelvis. There was also a second ureteral tumor fungating from the right ureteral orifice extending into the distal ureter also consistent with high-grade invasive urothelial carcinoma. Muscle invasion could not be assessed. He also underwent a CT chest which showed a rounded nodule in the right upper lobe measuring 14 mm concerning for metastases. 2 other 3 mm lesions were also noted in the left upper lobe likely benign  Plan initially was neoadjuvant chemotherapy followed by possible surgery but given the presence of lung lesion patient has been referred to oncology for the  same.  PET/CT on 09/21/17 showed:IMPRESSION: 1. Hypermetabolic right obturator lymph node is most indicative of metastatic disease. 2. Mildly hypermetabolic left hilar lymph nodes are nonspecific. Continued attention on follow-up exams is warranted. 3. Right upper lobe pulmonary nodule shows metabolism after just above blood pool and is therefore indeterminate. Continued attention on follow-up exams is warranted. 4. Aortic atherosclerosis (ICD10-170.0). Coronary artery calcification. 5. Cholelithiasis   Plan is to proceed with 4-6 cycles of carboplatin/ gemzar1 week on and one-week off as patient could not tolerate 2-week on and one week off regimen. Cycle 1 started on 09/22/2017   Interval history-he has intermittent penile pain and sometimes reports difficulty urinating which is sporadic and self-limited.  Has mild fatigue which is unchanged.  Denies other complaints  ECOG PS- 1 Pain scale- 0 Opioid associated constipation- no  Review of systems- Review of Systems  Constitutional: Positive for malaise/fatigue. Negative for chills, fever and weight loss.  HENT: Negative for congestion, ear discharge and nosebleeds.   Eyes: Negative for blurred vision.  Respiratory: Negative for cough, hemoptysis, sputum production, shortness of breath and wheezing.   Cardiovascular: Negative for chest pain, palpitations, orthopnea and claudication.  Gastrointestinal: Negative for abdominal pain, blood in stool, constipation, diarrhea, heartburn, melena, nausea and vomiting.  Genitourinary: Negative for dysuria, flank pain, frequency, hematuria and urgency.       Penile pain and hesitancy  Musculoskeletal: Negative for back pain, joint pain and myalgias.  Skin: Negative for rash.  Neurological: Negative for dizziness, tingling, focal weakness, seizures, weakness and headaches.  Endo/Heme/Allergies: Does not bruise/bleed easily.  Psychiatric/Behavioral: Negative for depression and suicidal  ideas. The patient does not have insomnia.       Allergies  Allergen Reactions  . Demerol [Meperidine] Nausea And  Vomiting  . Lipitor [Atorvastatin] Swelling  . Sulfa Antibiotics Nausea And Vomiting and Rash     Past Medical History:  Diagnosis Date  . Arthritis   . Benign fibroma of prostate 08/23/2013  . BPH (benign prostatic hyperplasia)   . Calculus of kidney 08/23/2013  . Glaucoma    no drops in 3 mo pressure good, pt denies glaucoma, eye pressure has been measuring alright.  . History of kidney stones   . HLD (hyperlipidemia)   . HOH (hard of hearing)    Left Hearing Aid  . HTN (hypertension) 12/26/2014  . Hypertension   . Hyponatremia 12/26/2014  . Migraines    history of migraines when he was younger.  [name] Restless leg syndrome   . Sinus drainage   . Skin cancer   . Urothelial cancer ([place])    chemo tx's.  [name] UTI (lower urinary tract infection) 12/26/2014  . Vertigo      Past Surgical History:  Procedure Laterality Date  . COLONOSCOPY    . CYSTOSCOPY W/ RETROGRADES Right 01/30/2015   Procedure: CYSTOSCOPY WITH RETROGRADE PYELOGRAM;  Surgeon: [name], MD;  Location: [facility];  Service: Urology;  Laterality: Right;  . CYSTOSCOPY W/ RETROGRADES Bilateral 02/26/2016   Procedure: CYSTOSCOPY WITH RETROGRADE PYELOGRAM;  Surgeon: [name], MD;  Location: [facility];  Service: Urology;  Laterality: Bilateral;  . CYSTOSCOPY W/ URETERAL STENT PLACEMENT Right 08/20/2015   Procedure: CYSTOSCOPY WITH RETROGRADE PYELOGRAM/POSSIBLE URETERAL STENT PLACEMENT/BLADDER BIOPSY;  Surgeon: [name], MD;  Location: [facility];  Service: Urology;  Laterality: Right;  . CYSTOSCOPY W/ URETERAL STENT PLACEMENT Right 09/12/2015   Procedure: CYSTOSCOPY WITH STENT REPLACEMENT;  Surgeon: [name], MD;  Location: [facility];  Service: Urology;  Laterality: Right;  . CYSTOSCOPY WITH BIOPSY Right 09/12/2015   Procedure: CYSTOSCOPY WITH BLADDER AND URETERAL BIOPSY;  Surgeon: [name], MD;  Location: [facility];  Service: Urology;  Laterality: Right;  . CYSTOSCOPY WITH STENT PLACEMENT Right 01/30/2015   Procedure: CYSTOSCOPY WITH STENT PLACEMENT;  Surgeon: [name], MD;  Location: [facility];  Service: Urology;  Laterality: Right;  . CYSTOSCOPY WITH STENT PLACEMENT Right 12/28/2017   Procedure: Perla WITH STENT Exchange;  Surgeon: [name], MD;  Location: [facility];  Service: Urology;  Laterality: Right;  . CYSTOSCOPY/URETEROSCOPY/HOLMIUM LASER/STENT PLACEMENT Right 08/19/2017   Procedure: CYSTOSCOPY/URETEROSCOPY/HOLMIUM LASER/STENT PLACEMENT;  Surgeon: [name], MD;  Location: [facility];  Service: Urology;  Laterality: Right;  . EYE SURGERY Bilateral    Cataract Extraction with IOL  . goiter removal    . HOLMIUM LASER APPLICATION N/A 5/63/8756   Procedure:  HOLMIUM LASER APPLICATION;  Surgeon: [name], MD;  Location: [facility];  Service: Urology;  Laterality: N/A;  . PORTA CATH INSERTION N/A 09/23/2017   Procedure: PORTA CATH INSERTION;  Surgeon: Algernon Huxley, MD;  Location: [facility];  Service: Cardiovascular;  Laterality: N/A;  . SPERMATOCELECTOMY    . TONSILLECTOMY    . TRANSURETHRAL RESECTION OF BLADDER TUMOR N/A 02/26/2016   Procedure: TRANSURETHRAL RESECTION OF BLADDER TUMOR (TURBT);  Surgeon: [name], MD;  Location: [facility];  Service: Urology;  Laterality: N/A;  . TRANSURETHRAL RESECTION OF BLADDER TUMOR WITH MITOMYCIN-C N/A 09/12/2015   Procedure: TRANSURETHRAL RESECTION OF BLADDER TUMOR ;  Surgeon: [name], MD;  Location: [facility];  Service: Urology;  Laterality: N/A;  . TRANSURETHRAL RESECTION OF BLADDER TUMOR WITH MITOMYCIN-C N/A 03/24/2016   Procedure: TRANSURETHRAL RESECTION OF BLADDER TUMOR WITH MITOMYCIN-C  (SMALL);  Surgeon:  [name], MD;  Location: [facility];  Service: Urology;  Laterality: N/A;  . URETERAL BIOPSY Right 08/19/2017   Procedure: Renal Mass BIOPSY;  Surgeon: [name], MD;   Location: [facility];  Service: Urology;  Laterality: Right;  . URETEROSCOPY Right 01/30/2015   Procedure: URETEROSCOPY/ WITH BIOPSY AND CYTOLOGY BRUSHING;  Surgeon: [name], MD;  Location: [facility];  Service: Urology;  Laterality: Right;  . URETEROSCOPY Right 08/20/2015   Procedure: URETEROSCOPY;  Surgeon: [name], MD;  Location: [facility];  Service: Urology;  Laterality: Right;  . URETEROSCOPY Right 09/12/2015   Procedure: URETEROSCOPY;  Surgeon: [name], MD;  Location: [facility];  Service: Urology;  Laterality: Right;  . URETEROSCOPY Right 02/26/2016   Procedure: URETEROSCOPY;  Surgeon: [name], MD;  Location: [facility];  Service: Urology;  Laterality: Right;    Social History   Socioeconomic History  . Marital status: Married    Spouse name: Not on file  . Number of children: Not on file  . Years of education: Not on file  . Highest education level: Not on file  Occupational History  . Not on file  Social Needs  . Financial resource strain: Not on file  . Food insecurity:    Worry: Not on file    Inability: Not on file  . Transportation needs:    Medical: Not on file    Non-medical: Not on file  Tobacco Use  . Smoking status: Never Smoker  . Smokeless tobacco: Never Used  Substance and Sexual Activity  . Alcohol use: No    Alcohol/week: 0.0 standard drinks  . Drug use: No  . Sexual activity: Not on file  Lifestyle  . Physical activity:    Days per week: Not on file    Minutes per session: Not on file  . Stress: Not on file  Relationships  . Social connections:    Talks on phone: Not on file    Gets together: Not on file    Attends religious service: Not on file    Active member of club or organization: Not on file    Attends meetings of clubs or organizations: Not on file    Relationship status: Not on file  . Intimate partner violence:    Fear of current or ex partner: Not on file    Emotionally abused: Not on file    Physically abused:  Not on file    Forced sexual activity: Not on file  Other Topics Concern  . Not on file  Social History Narrative  . Not on file    Family History  Problem Relation Age of Onset  . Hypertension Mother   . Hypertension Father   . Prostate cancer Brother   . Kidney disease Neg Hx   . Kidney cancer Neg Hx   . Bladder Cancer Neg Hx      Current Outpatient Medications:  .  acetaminophen (TYLENOL) 500 MG tablet, Take 1,000 mg by mouth daily as needed for moderate pain. , Disp: , Rfl:  .  benazepril-hydrochlorthiazide (LOTENSIN HCT) 20-12.5 MG tablet, Take 1 tablet by mouth daily. , Disp: , Rfl:  .  dexamethasone (DECADRON) 4 MG tablet, Take 2 tablets (8 mg total) by mouth daily. Start the day after carboplatin chemotherapy for 2 days., Disp: 30 tablet, Rfl: 1 .  diazepam (VALIUM) 2 MG tablet, Take 5 mg by mouth at bedtime. , Disp: , Rfl:  .  docusate sodium (COLACE) 100 MG capsule, Take 100 mg  by mouth 2 (two) times daily. , Disp: , Rfl:  .  fenofibrate micronized (LOFIBRA) 134 MG capsule, Take 134 mg by mouth daily before breakfast. , Disp: , Rfl:  .  fluticasone (FLONASE) 50 MCG/ACT nasal spray, Place 2 sprays into both nostrils at bedtime., Disp: , Rfl:  .  HYDROcodone-acetaminophen (NORCO/VICODIN) 5-325 MG tablet, Take 1 tablet by mouth every 6 (six) hours as needed for severe pain., Disp: 30 tablet, Rfl: 0 .  hydrocortisone cream 1 %, Apply 1 application topically daily as needed for itching., Disp: , Rfl:  .  lidocaine-prilocaine (EMLA) cream, Apply to affected area once, Disp: 30 g, Rfl: 3 .  LORazepam (ATIVAN) 0.5 MG tablet, Take 1 tablet (0.5 mg total) by mouth every 6 (six) hours as needed (Nausea or vomiting)., Disp: 30 tablet, Rfl: 0 .  Multiple Vitamin (MULTIVITAMIN WITH MINERALS) TABS tablet, Take 1 tablet by mouth daily., Disp: , Rfl:  .  ondansetron (ZOFRAN) 8 MG tablet, Take 1 tablet (8 mg total) by mouth 2 (two) times daily as needed for refractory nausea / vomiting. Start  on day 3 after carboplatin chemo., Disp: 30 tablet, Rfl: 1 .  Potassium 99 MG TABS, Take 99 mg by mouth at bedtime., Disp: , Rfl:  .  prochlorperazine (COMPAZINE) 10 MG tablet, Take 1 tablet (10 mg total) by mouth every 6 (six) hours as needed (Nausea or vomiting)., Disp: 30 tablet, Rfl: 1 .  psyllium (METAMUCIL) 58.6 % packet, Take 1 packet by mouth daily. , Disp: , Rfl:  .  tamsulosin (FLOMAX) 0.4 MG CAPS capsule, Take 1 capsule (0.4 mg total) by mouth daily., Disp: 90 capsule, Rfl: 3  Physical exam:  Vitals:   03/30/18 0834  BP: 125/81  Pulse: 87  Resp: 18  Temp: 98.2 F (36.8 C)  TempSrc: Tympanic  Weight: 195 lb 12.8 oz (88.8 kg)  Height: 6' (1.829 m)   Physical Exam HENT:     Head: Normocephalic and atraumatic.  Eyes:     Pupils: Pupils are equal, round, and reactive to light.  Neck:     Musculoskeletal: Normal range of motion.  Cardiovascular:     Rate and Rhythm: Normal rate and regular rhythm.     Heart sounds: Normal heart sounds.  Pulmonary:     Effort: Pulmonary effort is normal.     Breath sounds: Normal breath sounds.  Abdominal:     General: Bowel sounds are normal.     Palpations: Abdomen is soft.  Skin:    General: Skin is warm and dry.  Neurological:     Mental Status: He is alert and oriented to person, place, and time.      CMP Latest Ref Rng & Units 03/30/2018  Glucose 70 - 99 mg/dL 142(H)  BUN 8 - 23 mg/dL 16  Creatinine 0.61 - 1.24 mg/dL 1.09  Sodium 135 - 145 mmol/L 132(L)  Potassium 3.5 - 5.1 mmol/L 3.9  Chloride 98 - 111 mmol/L 98  CO2 22 - 32 mmol/L 27  Calcium 8.9 - 10.3 mg/dL 8.9  Total Protein 6.5 - 8.1 g/dL 6.8  Total Bilirubin 0.3 - 1.2 mg/dL 0.6  Alkaline Phos 38 - 126 U/L 88  AST 15 - 41 U/L 27  ALT 0 - 44 U/L 18   CBC Latest Ref Rng & Units 03/30/2018  WBC 4.0 - 10.5 K/uL 6.9  Hemoglobin 13.0 - 17.0 g/dL 11.3(L)  Hematocrit 39.0 - 52.0 % 33.9(L)  Platelets 150 - 400 K/uL 299  No images are attached to the  encounter.  Ct Chest W Contrast  Result Date: 03/08/2018 CLINICAL DATA:  85 year old male with history of urothelial carcinoma of the distal ureter. Follow-up study. EXAM: CT CHEST, ABDOMEN, AND PELVIS WITH CONTRAST TECHNIQUE: Multidetector CT imaging of the chest, abdomen and pelvis was performed following the standard protocol during bolus administration of intravenous contrast. CONTRAST:  154mL ISOVUE-300 IOPAMIDOL (ISOVUE-300) INJECTION 61% COMPARISON:  CT the chest, abdomen and pelvis 12/09/2017. FINDINGS: CT CHEST FINDINGS Cardiovascular: Heart size is normal. There is no significant pericardial fluid, thickening or pericardial calcification. There is aortic atherosclerosis, as well as atherosclerosis of the great vessels of the mediastinum and the coronary arteries, including calcified atherosclerotic plaque in the left anterior descending coronary artery. Right internal jugular Port-A-Cath with tip terminating in the distal superior vena cava. Mediastinum/Nodes: No pathologically enlarged mediastinal or hilar lymph nodes. Esophagus is unremarkable in appearance. No axillary lymphadenopathy. Lungs/Pleura: Previously noted pulmonary nodules are generally similar to the prior study, with the largest of these in the right upper lobe medially (axial image 68 of series 3) measuring 10 x 14 mm, unchanged. 3 mm nodule in the left upper lobe (axial image 60 of series 3) and 2 mm nodule in the superior segment of the left lower lobe (axial image 74 of series 3) are stable compared to the prior study. New 3 mm nodule in the superior segment of the left lower lobe (axial image 76 of series 3). No acute consolidative airspace disease. No pleural effusions. Musculoskeletal: There are no aggressive appearing lytic or blastic lesions noted in the visualized portions of the skeleton. CT ABDOMEN PELVIS FINDINGS Hepatobiliary: No suspicious cystic or solid hepatic lesions. No intra or extrahepatic biliary ductal  dilatation. Small calcified gallstones lying dependently in the gallbladder. No findings to suggest an acute cholecystitis at this time. Pancreas: 11 x 7 mm lesion in the inferior aspect of the pancreatic head (axial image 78 of series 2), stable compared to the prior study. No other suspicious appearing pancreatic mass. No pancreatic ductal dilatation. No pancreatic or peripancreatic fluid or inflammatory changes. Spleen: Unremarkable. Adrenals/Urinary Tract: Right-sided double-J ureteral stent in position with the proximal loop reformed in the right renal pelvis and distal loop reformed in the urinary bladder. In the right renal pelvis there are 2 areas of abnormal urothelial enhancement. The largest of these is a mass-like area in the upper pole measuring 2.6 x 1.5 x 1.7 cm (coronal image 79 of series 4 and axial image 71 of series 2). The smaller lesion is in the interpolar region measuring 1.4 x 1.8 x 1.6 cm (coronal image 74 of series 4 and axial image 75 of series 2). These both appear slightly larger when compared to the prior study. 4.4 cm exophytic simple cyst in the lower pole of the right kidney. Subcentimeter low-attenuation lesion in the interpolar region of the left kidney, too small to characterize, but similar to prior studies, statistically likely a cyst. No hydroureteronephrosis. No definite urothelial mass noted along the distal course of the right ureter on today's examination. Along the posterolateral wall of the urinary bladder near the base there is a small 1.7 cm left-sided diverticulum. Urinary bladder is otherwise unremarkable in appearance. Bilateral adrenal glands are normal in appearance. Stomach/Bowel: Normal appearance of the stomach. No pathologic dilatation of small bowel or colon. A few scattered colonic diverticulae are noted, without surrounding inflammatory changes to suggest an acute diverticulitis at this time. Normal appendix. Vascular/Lymphatic: Aortic atherosclerosis,  without evidence of aneurysm or dissection in the abdominal or pelvic vasculature. Previously noted mildly enlarged and enhancing right obturator lymph node is similar to the prior study measuring 11 mm in short axis (axial image 112 of series 2). No new lymphadenopathy noted in the abdomen or pelvis. Reproductive: Prostate gland and seminal vesicles are unremarkable in appearance. Other: No significant volume of ascites.  No pneumoperitoneum. Musculoskeletal: There are no aggressive appearing lytic or blastic lesions noted in the visualized portions of the skeleton. IMPRESSION: 1. Slight progressive enlargement of 2 upper tract urothelial lesions in the right renal collecting system, as above. No distal right ureteral lesion identified on today's examination. 2. Previously noted pulmonary nodules generally appear stable in size compared to the prior study. However, there is a new 3 mm nodule in the superior segment of the left lower lobe (axial image 76 of series 3). Close attention at time of future follow-up examinations is recommended to ensure the stability or resolution of this new lesion. 3. Right obturator lymphadenopathy is stable compared to the prior study. 4. No other sites of potential metastatic disease noted elsewhere in the chest, abdomen or pelvis. 5. Right-sided double-J ureteral stent appears appropriately positioned. No current hydroureteronephrosis. 6. Colonic diverticulosis without evidence of acute diverticulitis at this time. 7. Cholelithiasis without evidence of acute cholecystitis at this time. 8. Aortic atherosclerosis, in addition to left anterior descending coronary artery disease. 9. Additional incidental findings, as above. Electronically Signed   By: Vinnie Langton M.D.   On: 03/08/2018 11:29   Ct Abdomen Pelvis W Contrast  Result Date: 03/08/2018 CLINICAL DATA:  85 year old male with history of urothelial carcinoma of the distal ureter. Follow-up study. EXAM: CT CHEST,  ABDOMEN, AND PELVIS WITH CONTRAST TECHNIQUE: Multidetector CT imaging of the chest, abdomen and pelvis was performed following the standard protocol during bolus administration of intravenous contrast. CONTRAST:  172mL ISOVUE-300 IOPAMIDOL (ISOVUE-300) INJECTION 61% COMPARISON:  CT the chest, abdomen and pelvis 12/09/2017. FINDINGS: CT CHEST FINDINGS Cardiovascular: Heart size is normal. There is no significant pericardial fluid, thickening or pericardial calcification. There is aortic atherosclerosis, as well as atherosclerosis of the great vessels of the mediastinum and the coronary arteries, including calcified atherosclerotic plaque in the left anterior descending coronary artery. Right internal jugular Port-A-Cath with tip terminating in the distal superior vena cava. Mediastinum/Nodes: No pathologically enlarged mediastinal or hilar lymph nodes. Esophagus is unremarkable in appearance. No axillary lymphadenopathy. Lungs/Pleura: Previously noted pulmonary nodules are generally similar to the prior study, with the largest of these in the right upper lobe medially (axial image 68 of series 3) measuring 10 x 14 mm, unchanged. 3 mm nodule in the left upper lobe (axial image 60 of series 3) and 2 mm nodule in the superior segment of the left lower lobe (axial image 74 of series 3) are stable compared to the prior study. New 3 mm nodule in the superior segment of the left lower lobe (axial image 76 of series 3). No acute consolidative airspace disease. No pleural effusions. Musculoskeletal: There are no aggressive appearing lytic or blastic lesions noted in the visualized portions of the skeleton. CT ABDOMEN PELVIS FINDINGS Hepatobiliary: No suspicious cystic or solid hepatic lesions. No intra or extrahepatic biliary ductal dilatation. Small calcified gallstones lying dependently in the gallbladder. No findings to suggest an acute cholecystitis at this time. Pancreas: 11 x 7 mm lesion in the inferior aspect of the  pancreatic head (axial image 78 of series 2), stable compared to the prior  study. No other suspicious appearing pancreatic mass. No pancreatic ductal dilatation. No pancreatic or peripancreatic fluid or inflammatory changes. Spleen: Unremarkable. Adrenals/Urinary Tract: Right-sided double-J ureteral stent in position with the proximal loop reformed in the right renal pelvis and distal loop reformed in the urinary bladder. In the right renal pelvis there are 2 areas of abnormal urothelial enhancement. The largest of these is a mass-like area in the upper pole measuring 2.6 x 1.5 x 1.7 cm (coronal image 79 of series 4 and axial image 71 of series 2). The smaller lesion is in the interpolar region measuring 1.4 x 1.8 x 1.6 cm (coronal image 74 of series 4 and axial image 75 of series 2). These both appear slightly larger when compared to the prior study. 4.4 cm exophytic simple cyst in the lower pole of the right kidney. Subcentimeter low-attenuation lesion in the interpolar region of the left kidney, too small to characterize, but similar to prior studies, statistically likely a cyst. No hydroureteronephrosis. No definite urothelial mass noted along the distal course of the right ureter on today's examination. Along the posterolateral wall of the urinary bladder near the base there is a small 1.7 cm left-sided diverticulum. Urinary bladder is otherwise unremarkable in appearance. Bilateral adrenal glands are normal in appearance. Stomach/Bowel: Normal appearance of the stomach. No pathologic dilatation of small bowel or colon. A few scattered colonic diverticulae are noted, without surrounding inflammatory changes to suggest an acute diverticulitis at this time. Normal appendix. Vascular/Lymphatic: Aortic atherosclerosis, without evidence of aneurysm or dissection in the abdominal or pelvic vasculature. Previously noted mildly enlarged and enhancing right obturator lymph node is similar to the prior study measuring 11  mm in short axis (axial image 112 of series 2). No new lymphadenopathy noted in the abdomen or pelvis. Reproductive: Prostate gland and seminal vesicles are unremarkable in appearance. Other: No significant volume of ascites.  No pneumoperitoneum. Musculoskeletal: There are no aggressive appearing lytic or blastic lesions noted in the visualized portions of the skeleton. IMPRESSION: 1. Slight progressive enlargement of 2 upper tract urothelial lesions in the right renal collecting system, as above. No distal right ureteral lesion identified on today's examination. 2. Previously noted pulmonary nodules generally appear stable in size compared to the prior study. However, there is a new 3 mm nodule in the superior segment of the left lower lobe (axial image 76 of series 3). Close attention at time of future follow-up examinations is recommended to ensure the stability or resolution of this new lesion. 3. Right obturator lymphadenopathy is stable compared to the prior study. 4. No other sites of potential metastatic disease noted elsewhere in the chest, abdomen or pelvis. 5. Right-sided double-J ureteral stent appears appropriately positioned. No current hydroureteronephrosis. 6. Colonic diverticulosis without evidence of acute diverticulitis at this time. 7. Cholelithiasis without evidence of acute cholecystitis at this time. 8. Aortic atherosclerosis, in addition to left anterior descending coronary artery disease. 9. Additional incidental findings, as above. Electronically Signed   By: Vinnie Langton M.D.   On: 03/08/2018 11:29     Assessment and plan- Patient is a 85 y.o. 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 He is here for on treatment assessment prior to cycle 7-day 8 of gemcitabine and carboplatin  Counts okay to proceed with cycle 7-day 8 of gemcitabine and carboplatin today.   He has mild chemo induced anemia which  is essentially stable.  I will see him back in 2 weeks time for cycle 8-day 1 of gemcitabine and carboplatin.  Plan is to continue treatment until progression or toxicity.  I will plan to obtain scans after 9 cycles.  Patient has been having some ongoing penile pain as well as Hesitancy while passing urine.  It is unclear if this is related to his masslike area in the upper pole which could be causing any mechanical obstruction.  I will discuss this with [name].  Continue to monitor for now   Visit Diagnosis 1. Encounter for antineoplastic chemotherapy   2. 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[place])      Dr. Randa Evens, MD, MPH [facility] at [facility] [phone] 03/30/2018 1:08 PM

## 2018-04-01 ENCOUNTER — Inpatient Hospital Stay: Payer: Medicare HMO

## 2018-04-13 ENCOUNTER — Encounter: Payer: Self-pay | Admitting: Oncology

## 2018-04-13 ENCOUNTER — Telehealth: Payer: Self-pay | Admitting: Urology

## 2018-04-13 ENCOUNTER — Inpatient Hospital Stay: Payer: Medicare HMO

## 2018-04-13 ENCOUNTER — Ambulatory Visit
Admission: RE | Admit: 2018-04-13 | Discharge: 2018-04-13 | Disposition: A | Discharge status: Home / Self Care | Payer: Medicare HMO | Source: Ambulatory Visit | Attending: Urology | Admitting: Urology

## 2018-04-13 ENCOUNTER — Inpatient Hospital Stay: Payer: Medicare HMO | Admitting: Oncology

## 2018-04-13 ENCOUNTER — Other Ambulatory Visit: Payer: Self-pay

## 2018-04-13 ENCOUNTER — Encounter: Payer: Self-pay | Admitting: Urology

## 2018-04-13 ENCOUNTER — Ambulatory Visit: Payer: Medicare HMO | Admitting: Urology

## 2018-04-13 ENCOUNTER — Inpatient Hospital Stay: Payer: Medicare HMO | Attending: Oncology

## 2018-04-13 VITALS — BP 136/76 | HR 90 | Temp 97.0°F | Resp 18 | Wt 198.6 lb

## 2018-04-13 VITALS — BP 152/67 | HR 118 | Ht 72.0 in | Wt 196.0 lb

## 2018-04-13 DIAGNOSIS — Z8042 Family history of malignant neoplasm of prostate: Secondary | ICD-10-CM | POA: Diagnosis not present

## 2018-04-13 DIAGNOSIS — Z79899 Other long term (current) drug therapy: Secondary | ICD-10-CM

## 2018-04-13 DIAGNOSIS — C661 Malignant neoplasm of right ureter: Secondary | ICD-10-CM | POA: Insufficient documentation

## 2018-04-13 DIAGNOSIS — R300 Dysuria: Secondary | ICD-10-CM | POA: Diagnosis not present

## 2018-04-13 DIAGNOSIS — T451X5A Adverse effect of antineoplastic and immunosuppressive drugs, initial encounter: Secondary | ICD-10-CM

## 2018-04-13 DIAGNOSIS — N4889 Other specified disorders of penis: Secondary | ICD-10-CM | POA: Diagnosis not present

## 2018-04-13 DIAGNOSIS — D6481 Anemia due to antineoplastic chemotherapy: Secondary | ICD-10-CM | POA: Diagnosis not present

## 2018-04-13 DIAGNOSIS — R3915 Urgency of urination: Secondary | ICD-10-CM | POA: Diagnosis not present

## 2018-04-13 DIAGNOSIS — I10 Essential (primary) hypertension: Secondary | ICD-10-CM

## 2018-04-13 DIAGNOSIS — C7801 Secondary malignant neoplasm of right lung: Secondary | ICD-10-CM | POA: Insufficient documentation

## 2018-04-13 DIAGNOSIS — Z5111 Encounter for antineoplastic chemotherapy: Secondary | ICD-10-CM | POA: Insufficient documentation

## 2018-04-13 DIAGNOSIS — Z8249 Family history of ischemic heart disease and other diseases of the circulatory system: Secondary | ICD-10-CM

## 2018-04-13 DIAGNOSIS — Z5189 Encounter for other specified aftercare: Secondary | ICD-10-CM | POA: Diagnosis present

## 2018-04-13 DIAGNOSIS — C669 Malignant neoplasm of unspecified ureter: Secondary | ICD-10-CM

## 2018-04-13 LAB — CBC WITH DIFFERENTIAL/PLATELET
Abs Immature Granulocytes: 0.03 10*3/uL (ref 0.00–0.07)
Basophils Absolute: 0 10*3/uL (ref 0.0–0.1)
Basophils Relative: 1 %
Eosinophils Absolute: 0.1 10*3/uL (ref 0.0–0.5)
Eosinophils Relative: 2 %
HCT: 34 % — ABNORMAL LOW (ref 39.0–52.0)
Hemoglobin: 11.4 g/dL — ABNORMAL LOW (ref 13.0–17.0)
Immature Granulocytes: 1 %
Lymphocytes Relative: 21 %
Lymphs Abs: 0.9 10*3/uL (ref 0.7–4.0)
MCH: 30.4 pg (ref 26.0–34.0)
MCHC: 33.5 g/dL (ref 30.0–36.0)
MCV: 90.7 fL (ref 80.0–100.0)
Monocytes Absolute: 0.7 10*3/uL (ref 0.1–1.0)
Monocytes Relative: 17 %
Neutro Abs: 2.4 10*3/uL (ref 1.7–7.7)
Neutrophils Relative %: 58 %
Platelets: 207 10*3/uL (ref 150–400)
RBC: 3.75 MIL/uL — ABNORMAL LOW (ref 4.22–5.81)
RDW: 14.7 % (ref 11.5–15.5)
WBC: 4.2 10*3/uL (ref 4.0–10.5)
nRBC: 0 % (ref 0.0–0.2)

## 2018-04-13 LAB — URINALYSIS, COMPLETE
Bilirubin, UA: NEGATIVE
Glucose, UA: NEGATIVE
KETONES UA: NEGATIVE
Leukocytes, UA: NEGATIVE
Nitrite, UA: NEGATIVE
Protein, UA: NEGATIVE
SPEC GRAV UA: 1.01 (ref 1.005–1.030)
Urobilinogen, Ur: 0.2 mg/dL (ref 0.2–1.0)
pH, UA: 6 (ref 5.0–7.5)

## 2018-04-13 LAB — COMPREHENSIVE METABOLIC PANEL
ALT: 18 U/L (ref 0–44)
AST: 32 U/L (ref 15–41)
Albumin: 3.8 g/dL (ref 3.5–5.0)
Alkaline Phosphatase: 60 U/L (ref 38–126)
Anion gap: 9 (ref 5–15)
BUN: 14 mg/dL (ref 8–23)
CO2: 26 mmol/L (ref 22–32)
Calcium: 8.9 mg/dL (ref 8.9–10.3)
Chloride: 99 mmol/L (ref 98–111)
Creatinine, Ser: 1.21 mg/dL (ref 0.61–1.24)
GFR calc Af Amer: >60 mL/min (ref >60)
GFR calc non Af Amer: 55 mL/min — ABNORMAL LOW (ref >60)
Glucose, Bld: 120 mg/dL — ABNORMAL HIGH (ref 70–99)
Potassium: 3.7 mmol/L (ref 3.5–5.1)
Sodium: 134 mmol/L — ABNORMAL LOW (ref 135–145)
TOTAL PROTEIN: 6.9 g/dL (ref 6.5–8.1)
Total Bilirubin: 0.5 mg/dL (ref 0.3–1.2)

## 2018-04-13 LAB — MICROSCOPIC EXAMINATION
Bacteria, UA: NONE SEEN
EPITHELIAL CELLS (NON RENAL): NONE SEEN /HPF (ref 0–10)
WBC, UA: NONE SEEN /hpf (ref 0–5)

## 2018-04-13 LAB — BLADDER SCAN AMB NON-IMAGING

## 2018-04-13 MED ORDER — DEXAMETHASONE SODIUM PHOSPHATE 10 MG/ML IJ SOLN
10.0000 mg | Freq: Once | INTRAMUSCULAR | Status: AC
  Administered 2018-04-13: 10 mg via INTRAVENOUS
  Filled 2018-04-13: qty 1

## 2018-04-13 MED ORDER — PROCHLORPERAZINE MALEATE 10 MG PO TABS: 10.0000 mg | ORAL_TABLET | Freq: Four times a day (QID) | ORAL | 1 refills | Status: DC | PRN

## 2018-04-13 MED ORDER — SODIUM CHLORIDE 0.9 % IV SOLN
160.0000 mg | Freq: Once | INTRAVENOUS | Status: AC
  Administered 2018-04-13: 160 mg via INTRAVENOUS
  Filled 2018-04-13: qty 16

## 2018-04-13 MED ORDER — SODIUM CHLORIDE 0.9% FLUSH
10.0000 mL | Freq: Once | INTRAVENOUS | Status: AC
  Administered 2018-04-13: 10 mL via INTRAVENOUS
  Filled 2018-04-13: qty 10

## 2018-04-13 MED ORDER — SODIUM CHLORIDE 0.9 % IV SOLN
1600.0000 mg | Freq: Once | INTRAVENOUS | Status: AC
  Administered 2018-04-13: 1600 mg via INTRAVENOUS
  Filled 2018-04-13: qty 42.08

## 2018-04-13 MED ORDER — HEPARIN SOD (PORK) LOCK FLUSH 100 UNIT/ML IV SOLN
500.0000 [IU] | Freq: Once | INTRAVENOUS | Status: AC
  Administered 2018-04-13: 500 [IU] via INTRAVENOUS
  Filled 2018-04-13: qty 5

## 2018-04-13 MED ORDER — SODIUM CHLORIDE 0.9 % IV SOLN
Freq: Once | INTRAVENOUS | Status: AC
  Administered 2018-04-13: 09:00:00 via INTRAVENOUS
  Filled 2018-04-13: qty 250

## 2018-04-13 MED ORDER — PALONOSETRON HCL INJECTION 0.25 MG/5ML
0.2500 mg | Freq: Once | INTRAVENOUS | Status: AC
  Administered 2018-04-13: 0.25 mg via INTRAVENOUS
  Filled 2018-04-13: qty 5

## 2018-04-13 NOTE — Telephone Encounter (Signed)
Have him come by and pick up Mybetriq 50 mg x 4 weeks and schedule f/u with me or shannon in 1-2 weeks to assess.  [name], MD

## 2018-04-13 NOTE — Progress Notes (Signed)
Here for follow up. Needs renewal of Comapazine -pended. Stated he has " no pain in my penis"  Continues to have urinary frequency he stated-on going . Overall stated he is feeling " the same "

## 2018-04-13 NOTE — Progress Notes (Signed)
04/13/2018  2:08 PM   Anthony Gonzales Dec 05, 1931 [id number]  Referring provider: Idelle Crouch, MD [facility] [place], [place] [address]  Chief Complaint  Patient presents with  . Urinary Frequency    HPI: Anthony Gonzales is a 85 y.o. White or Caucasian male that presents today for evaluation and management of his frequency and penile pain.  Patient reports his symptoms have persisted over the past month.   He feels that he isn't emptying completely. Most notably this past Sunday when he experienced frequency every hour. He reports that for the past couple of days his symptoms have progressed to waking every 2 hours overnight with varying need or straining to complete urination. He reports no aggravating, escalating or deescalating factors.   He reports throbbing/pulsating penis pain. He reports this pain is partially positional.  Patient admits his most bothersome symptom is nocturia.  He does have a chronic indwelling stent.  PMH: Past Medical History:  Diagnosis Date  . Arthritis   . Benign fibroma of prostate 08/23/2013  . BPH (benign prostatic hyperplasia)   . Calculus of kidney 08/23/2013  . Glaucoma    no drops in 3 mo pressure good, pt denies glaucoma, eye pressure has been measuring alright.  . History of kidney stones   . HLD (hyperlipidemia)   . HOH (hard of hearing)    Left Hearing Aid  . HTN (hypertension) 12/26/2014  . Hypertension   . Hyponatremia 12/26/2014  . Migraines    history of migraines when he was younger.  [name] Restless leg syndrome   . Sinus drainage   . Skin cancer   . Urothelial cancer ([place])    chemo tx's.  [name] UTI (lower urinary tract infection) 12/26/2014  . Vertigo     Surgical History: Past Surgical History:  Procedure Laterality Date  . COLONOSCOPY    . CYSTOSCOPY W/ RETROGRADES Right 01/30/2015   Procedure: CYSTOSCOPY WITH RETROGRADE PYELOGRAM;  Surgeon: [name], MD;  Location: [facility]  ORS;  Service: Urology;  Laterality: Right;  . CYSTOSCOPY W/ RETROGRADES Bilateral 02/26/2016   Procedure: CYSTOSCOPY WITH RETROGRADE PYELOGRAM;  Surgeon: [name], MD;  Location: [facility] ORS;  Service: Urology;  Laterality: Bilateral;  . CYSTOSCOPY W/ URETERAL STENT PLACEMENT Right 08/20/2015   Procedure: CYSTOSCOPY WITH RETROGRADE PYELOGRAM/POSSIBLE URETERAL STENT PLACEMENT/BLADDER BIOPSY;  Surgeon: [name], MD;  Location: [facility] ORS;  Service: Urology;  Laterality: Right;  . CYSTOSCOPY W/ URETERAL STENT PLACEMENT Right 09/12/2015   Procedure: CYSTOSCOPY WITH STENT REPLACEMENT;  Surgeon: [name], MD;  Location: [facility] ORS;  Service: Urology;  Laterality: Right;  . CYSTOSCOPY WITH BIOPSY Right 09/12/2015   Procedure: CYSTOSCOPY WITH BLADDER AND URETERAL BIOPSY;  Surgeon: [name], MD;  Location: [facility] ORS;  Service: Urology;  Laterality: Right;  . CYSTOSCOPY WITH STENT PLACEMENT Right 01/30/2015   Procedure: CYSTOSCOPY WITH STENT PLACEMENT;  Surgeon: [name], MD;  Location: [facility] ORS;  Service: Urology;  Laterality: Right;  . CYSTOSCOPY WITH STENT PLACEMENT Right 12/28/2017   Procedure: [place] WITH STENT Exchange;  Surgeon: [name], MD;  Location: [facility] ORS;  Service: Urology;  Laterality: Right;  . CYSTOSCOPY/URETEROSCOPY/HOLMIUM LASER/STENT PLACEMENT Right 08/19/2017   Procedure: CYSTOSCOPY/URETEROSCOPY/HOLMIUM LASER/STENT PLACEMENT;  Surgeon: [name], MD;  Location: [facility] ORS;  Service: Urology;  Laterality: Right;  . EYE SURGERY Bilateral    Cataract Extraction with IOL  . goiter removal    . HOLMIUM LASER APPLICATION N/A 05/05/3333   Procedure:  HOLMIUM LASER APPLICATION;  Surgeon: [name], MD;  Location: [facility] ORS;  Service: Urology;  Laterality: N/A;  . PORTA CATH INSERTION N/A 09/23/2017   Procedure: PORTA CATH INSERTION;  Surgeon: Algernon Huxley, MD;  Location: [place] CV LAB;  Service: Cardiovascular;  Laterality: N/A;  . SPERMATOCELECTOMY     . TONSILLECTOMY    . TRANSURETHRAL RESECTION OF BLADDER TUMOR N/A 02/26/2016   Procedure: TRANSURETHRAL RESECTION OF BLADDER TUMOR (TURBT);  Surgeon: [name], MD;  Location: [facility] ORS;  Service: Urology;  Laterality: N/A;  . TRANSURETHRAL RESECTION OF BLADDER TUMOR WITH MITOMYCIN-C N/A 09/12/2015   Procedure: TRANSURETHRAL RESECTION OF BLADDER TUMOR ;  Surgeon: [name], MD;  Location: [facility] ORS;  Service: Urology;  Laterality: N/A;  . TRANSURETHRAL RESECTION OF BLADDER TUMOR WITH MITOMYCIN-C N/A 03/24/2016   Procedure: TRANSURETHRAL RESECTION OF BLADDER TUMOR WITH MITOMYCIN-C  (SMALL);  Surgeon: [name], MD;  Location: [facility] ORS;  Service: Urology;  Laterality: N/A;  . URETERAL BIOPSY Right 08/19/2017   Procedure: Renal Mass BIOPSY;  Surgeon: [name], MD;  Location: [facility] ORS;  Service: Urology;  Laterality: Right;  . URETEROSCOPY Right 01/30/2015   Procedure: URETEROSCOPY/ WITH BIOPSY AND CYTOLOGY BRUSHING;  Surgeon: [name], MD;  Location: [facility] ORS;  Service: Urology;  Laterality: Right;  . URETEROSCOPY Right 08/20/2015   Procedure: URETEROSCOPY;  Surgeon: [name], MD;  Location: [facility] ORS;  Service: Urology;  Laterality: Right;  . URETEROSCOPY Right 09/12/2015   Procedure: URETEROSCOPY;  Surgeon: [name], MD;  Location: [facility] ORS;  Service: Urology;  Laterality: Right;  . URETEROSCOPY Right 02/26/2016   Procedure: URETEROSCOPY;  Surgeon: [name], MD;  Location: [facility] ORS;  Service: Urology;  Laterality: Right;    Home Medications:  Allergies as of 04/13/2018      Reactions   Demerol [meperidine] Nausea And Vomiting   Lipitor [atorvastatin] Swelling   Sulfa Antibiotics Nausea And Vomiting, Rash      Medication List       Accurate as of April 13, 2018  2:08 PM. Always use your most recent med list.        acetaminophen 500 MG tablet Commonly known as:  TYLENOL Take 1,000 mg by mouth daily as needed for moderate pain.   amLODipine 5  MG tablet Commonly known as:  NORVASC Take 5 mg by mouth daily.   benazepril-hydrochlorthiazide 20-12.5 MG tablet Commonly known as:  LOTENSIN HCT Take 1 tablet by mouth daily.   dexamethasone 4 MG tablet Commonly known as:  DECADRON Take 2 tablets (8 mg total) by mouth daily. Start the day after carboplatin chemotherapy for 2 days.   diazepam 2 MG tablet Commonly known as:  VALIUM Take 5 mg by mouth at bedtime.   diazepam 5 MG tablet Commonly known as:  VALIUM TAKE 1 TABLET (5 MG TOTAL) BY MOUTH EVERY 12 (TWELVE) HOURS AS NEEDED FOR ANXIETY OR SLEEP   docusate sodium 100 MG capsule Commonly known as:  COLACE Take 100 mg by mouth 2 (two) times daily.   fenofibrate micronized 134 MG capsule Commonly known as:  LOFIBRA Take 134 mg by mouth daily before breakfast.   fluticasone 50 MCG/ACT nasal spray Commonly known as:  FLONASE Place 2 sprays into both nostrils at bedtime.   HYDROcodone-acetaminophen 5-325 MG tablet Commonly known as:  NORCO/VICODIN Take 1 tablet by mouth every 6 (six) hours as needed for severe pain.   hydrocortisone cream 1 % Apply 1 application topically daily as needed for itching.   lidocaine-prilocaine cream Commonly  known as:  EMLA Apply to affected area once   LORazepam 0.5 MG tablet Commonly known as:  ATIVAN Take 1 tablet (0.5 mg total) by mouth every 6 (six) hours as needed (Nausea or vomiting).   multivitamin with minerals Tabs tablet Take 1 tablet by mouth daily.   ondansetron 8 MG tablet Commonly known as:  ZOFRAN Take 1 tablet (8 mg total) by mouth 2 (two) times daily as needed for refractory nausea / vomiting. Start on day 3 after carboplatin chemo.   Potassium 99 MG Tabs Take 99 mg by mouth at bedtime.   PROBIOTIC-10 PO Take by mouth every morning.   prochlorperazine 10 MG tablet Commonly known as:  COMPAZINE Take 1 tablet (10 mg total) by mouth every 6 (six) hours as needed (Nausea or vomiting).   psyllium 58.6 %  packet Commonly known as:  METAMUCIL Take 1 packet by mouth daily.   tamsulosin 0.4 MG Caps capsule Commonly known as:  FLOMAX Take 1 capsule (0.4 mg total) by mouth daily.       Allergies:  Allergies  Allergen Reactions  . Demerol [Meperidine] Nausea And Vomiting  . Lipitor [Atorvastatin] Swelling  . Sulfa Antibiotics Nausea And Vomiting and Rash    Family History: Family History  Problem Relation Age of Onset  . Hypertension Mother   . Hypertension Father   . Prostate cancer Brother   . Kidney disease Neg Hx   . Kidney cancer Neg Hx   . Bladder Cancer Neg Hx     Social History:  reports that he has never smoked. He has never used smokeless tobacco. He reports that he does not drink alcohol or use drugs.  ROS: UROLOGY Frequent Urination?: Yes Hard to postpone urination?: No Burning/pain with urination?: Yes Get up at night to urinate?: Yes Leakage of urine?: No Urine stream starts and stops?: Yes Trouble starting stream?: Yes Do you have to strain to urinate?: Yes Blood in urine?: No Urinary tract infection?: No Sexually transmitted disease?: No Injury to kidneys or bladder?: Yes Painful intercourse?: No Weak stream?: Yes Erection problems?: No Penile pain?: Yes  Gastrointestinal Nausea?: Yes Vomiting?: No Indigestion/heartburn?: No Diarrhea?: No Constipation?: No  Constitutional Fever: No Night sweats?: Yes Weight loss?: No Fatigue?: No  Skin Skin rash/lesions?: No Itching?: No  Eyes Blurred vision?: No Double vision?: No  Ears/Nose/Throat Sore throat?: No Sinus problems?: Yes  Hematologic/Lymphatic Swollen glands?: No Easy bruising?: No  Cardiovascular Leg swelling?: No Chest pain?: No  Respiratory Cough?: No Shortness of breath?: No  Endocrine Excessive thirst?: No  Musculoskeletal Back pain?: No Joint pain?: No  Neurological Headaches?: Yes Dizziness?: No  Psychologic Depression?: No Anxiety?: No  Physical  Exam: BP (!) 152/67   Pulse (!) 118   Ht 6' (1.829 m)   Wt 196 lb (88.9 kg)   BMI 26.58 kg/m   Constitutional:  Alert and oriented, No acute distress. Respiratory: Normal respiratory effort, no increased work of breathing. Head: Normocephalic and Atraumatic. GU: No CVA tenderness Skin: No rashes, bruises or suspicious lesions. Neurologic: Grossly intact, no focal deficits, moving all 4 extremities. Psychiatric: Normal mood and affect.  Laboratory Data: Lab Results  Component Value Date   WBC 4.2 04/13/2018   HGB 11.4 (L) 04/13/2018   HCT 34.0 (L) 04/13/2018   MCV 90.7 04/13/2018   PLT 207 04/13/2018    Lab Results  Component Value Date   CREATININE 1.21 04/13/2018   Urinalysis Microscopic Exam  Ref Range and Units  WBC None  0-5/hpf  RBC 3-10 0-2/hpf  Epithelial Cells None 0-10/hpf (non-renal)  Renal Epithelial Cells None /hpf  Casts None None seen/lpf  Cast Type None   Crystals None None seen/lpf  Crystal Type None   Mucus Threads None Not established/lp  Bacteria None None to few seen  Yeast None None seen  Trichomonas None None seen  Dipstick Results    NIT Negative Negative  LEU Negative Negative   Results for orders placed or performed in visit on 04/13/18  BLADDER SCAN AMB NON-IMAGING  Result Value Ref Range   Scan Result 60ml    Assessment & Plan:    1. Urinary Frequency  - PVR today was 78 mL  - No evidence of infection on UA  - KUB today to r/o irritation due to stent placement  - Plan or cystoscopy to r/o bladder neck obstruction due to metastatic tumors  - I suspect his symptoms may be due to bladder spasm. Provided patient with Myrbetriq 50mg  #30 sample. I have advised the patient of the side effects of Myrbetriq, such as: elevation in BP, urinary retention and/or HA.   2. Penile Pain  - I suspect this discomfort is referred pain from stent as he reports his pain is partially positional in nature.   - See plan above  Return for next  available cystoscopy.  [facility] [address] [place], [place] [address] [phone]  I, Stephania Fragmin , am acting as a scribe for [name], MD  I have reviewed the above documentation for accuracy and completeness, and I agree with the above.   [name], MD

## 2018-04-13 NOTE — Telephone Encounter (Signed)
-----   Message from [name], MD sent at 04/13/2018  9:22 AM EST ----- Regarding: urinary symptoms This patient is having urinary symptoms and needs to be sooner.  Keep his other apt but schedule him a sooner with me or shannon.  [name], MD

## 2018-04-13 NOTE — Patient Instructions (Addendum)
To improve the tolerability of the stent, several medications are available.  Myrbetriq 50mg  This is a medication that helps relieve bladder cramps and spasms often experienced with a ureteral stent. You have been given samples of this medication, take 1 tablet daily   Frequently Asked Questions about Ureteral Stents: Q Is it normal to have discomfort present? A Yes. In fact, certain movements may increase discomfort. Examples include: frequent bending and increased activity. Q Is it normal to feel like I have to urinate more often? A Yes,  Q Is it normal to feel pain or pressure before and/or during urination? A Yes, some patients have significant pain in the back during urination because the stent allows urine to flow back toward the kidney. Others may experience pain in the bladder because the stent may cause a bladder cramp during or at the end of urination. Q Is it normal to have blood in my urine and what if it is not present all the time? A Yes. The stent will cause some irritation to tissue, it may be present while you have the stent and after you remove the stent. The blood may be a light red or darker at times. Don't be alarmed if the blood resolves and then returns. This is typical for most patients

## 2018-04-13 NOTE — Telephone Encounter (Signed)
Dr. Janese Banks called asking if you were going to call in some medication for this patient for his penile pain or do you want to see him first. He said this was discussed last week.  Please advise   Thanks, Eusebio Me  [phone] Is his nurse

## 2018-04-13 NOTE — Progress Notes (Signed)
Hematology/Oncology Consult note [facility]  Telephone:([phone] [phone]  Patient Care Team: Idelle Crouch, MD as PCP - General (Internal Medicine)   Name of the patient: Anthony Gonzales  [id number]  02-Oct-1931   Date of visit: 04/13/18  Diagnosis- Metastatic urothelial carcinoma with possible mets to the obturator node. Indeterminate hilar lymph nodes and LUL lung nodule   Chief complaint/ Reason for visit-on treatment assessment prior to cycle 8-day 1 of carboplatin and gemcitabine  Heme/Onc history: patient is a 85 year old male with past medical history significant for hypertension and long-standing history of superficial bladder cancer for which he sees [name]. He has undergone TURBT as well as ureteroscopy since 2017 along with mitomycin as well in the past. Most recently he underwent CT abdomen on 08/06/2017 which showed a soft tissue mass in the right renal pelvis concerning for upper tract urothelial neoplasm. Soft tissue fullness at the right ureterovesical junction with proximal right hydroureteronephrosis. Several millimeter attenuation lesion in the pancreatic head.  He underwent diagnostic ureteroscopy and was found to have 2 high-grade lesions within his right kidney. There was a nodular high-grade appearing lesion in the right anterior renal pelvis. There was also a second ureteral tumor fungating from the right ureteral orifice extending into the distal ureter also consistent with high-grade invasive urothelial carcinoma. Muscle invasion could not be assessed. He also underwent a CT chest which showed a rounded nodule in the right upper lobe measuring 14 mm concerning for metastases. 2 other 3 mm lesions were also noted in the left upper lobe likely benign  Plan initially was neoadjuvant chemotherapy followed by possible surgery but given the presence of lung lesion patient has been referred to oncology for the  same.  PET/CT on 09/21/17 showed:IMPRESSION: 1. Hypermetabolic right obturator lymph node is most indicative of metastatic disease. 2. Mildly hypermetabolic left hilar lymph nodes are nonspecific. Continued attention on follow-up exams is warranted. 3. Right upper lobe pulmonary nodule shows metabolism after just above blood pool and is therefore indeterminate. Continued attention on follow-up exams is warranted. 4. Aortic atherosclerosis (ICD10-170.0). Coronary artery calcification. 5. Cholelithiasis   Plan is to proceed with 4-6 cycles of carboplatin/ gemzar1 week on and one-week off as patient could not tolerate 2-week on and one week off regimen. Cycle 1 started on 09/22/2017  Interval history-he still has some on and off penile pain which comes and goes sporadically.  There are also times when he is unable to pass urine comfortably and has to wait for a while before he can get a good stream.  Denies other complaints.  Denies any nausea vomiting or fatigue.  ECOG PS- 1 Pain scale- 0 Opioid associated constipation- no  Review of systems- Review of Systems  Constitutional: Negative for chills, fever, malaise/fatigue and weight loss.  HENT: Negative for congestion, ear discharge and nosebleeds.   Eyes: Negative for blurred vision.  Respiratory: Negative for cough, hemoptysis, sputum production, shortness of breath and wheezing.   Cardiovascular: Negative for chest pain, palpitations, orthopnea and claudication.  Gastrointestinal: Negative for abdominal pain, blood in stool, constipation, diarrhea, heartburn, melena, nausea and vomiting.  Genitourinary: Negative for dysuria, flank pain, frequency, hematuria and urgency.       Penile pain and hesitancy  Musculoskeletal: Negative for back pain, joint pain and myalgias.  Skin: Negative for rash.  Neurological: Negative for dizziness, tingling, focal weakness, seizures, weakness and headaches.  Endo/Heme/Allergies: Does not  bruise/bleed easily.  Psychiatric/Behavioral: Negative for depression and  suicidal ideas. The patient does not have insomnia.        Allergies  Allergen Reactions  . Demerol [Meperidine] Nausea And Vomiting  . Lipitor [Atorvastatin] Swelling  . Sulfa Antibiotics Nausea And Vomiting and Rash     Past Medical History:  Diagnosis Date  . Arthritis   . Benign fibroma of prostate 08/23/2013  . BPH (benign prostatic hyperplasia)   . Calculus of kidney 08/23/2013  . Glaucoma    no drops in 3 mo pressure good, pt denies glaucoma, eye pressure has been measuring alright.  . History of kidney stones   . HLD (hyperlipidemia)   . HOH (hard of hearing)    Left Hearing Aid  . HTN (hypertension) 12/26/2014  . Hypertension   . Hyponatremia 12/26/2014  . Migraines    history of migraines when he was younger.  [name] Restless leg syndrome   . Sinus drainage   . Skin cancer   . Urothelial cancer ([place])    chemo tx's.  [name] UTI (lower urinary tract infection) 12/26/2014  . Vertigo      Past Surgical History:  Procedure Laterality Date  . COLONOSCOPY    . CYSTOSCOPY W/ RETROGRADES Right 01/30/2015   Procedure: CYSTOSCOPY WITH RETROGRADE PYELOGRAM;  Surgeon: [name], MD;  Location: [facility];  Service: Urology;  Laterality: Right;  . CYSTOSCOPY W/ RETROGRADES Bilateral 02/26/2016   Procedure: CYSTOSCOPY WITH RETROGRADE PYELOGRAM;  Surgeon: [name], MD;  Location: [facility];  Service: Urology;  Laterality: Bilateral;  . CYSTOSCOPY W/ URETERAL STENT PLACEMENT Right 08/20/2015   Procedure: CYSTOSCOPY WITH RETROGRADE PYELOGRAM/POSSIBLE URETERAL STENT PLACEMENT/BLADDER BIOPSY;  Surgeon: [name], MD;  Location: [facility];  Service: Urology;  Laterality: Right;  . CYSTOSCOPY W/ URETERAL STENT PLACEMENT Right 09/12/2015   Procedure: CYSTOSCOPY WITH STENT REPLACEMENT;  Surgeon: [name], MD;  Location: [facility];  Service: Urology;  Laterality: Right;  . CYSTOSCOPY WITH BIOPSY Right  09/12/2015   Procedure: CYSTOSCOPY WITH BLADDER AND URETERAL BIOPSY;  Surgeon: [name], MD;  Location: [facility];  Service: Urology;  Laterality: Right;  . CYSTOSCOPY WITH STENT PLACEMENT Right 01/30/2015   Procedure: CYSTOSCOPY WITH STENT PLACEMENT;  Surgeon: [name], MD;  Location: [facility];  Service: Urology;  Laterality: Right;  . CYSTOSCOPY WITH STENT PLACEMENT Right 12/28/2017   Procedure: [place] WITH STENT Exchange;  Surgeon: [name], MD;  Location: [facility];  Service: Urology;  Laterality: Right;  . CYSTOSCOPY/URETEROSCOPY/HOLMIUM LASER/STENT PLACEMENT Right 08/19/2017   Procedure: CYSTOSCOPY/URETEROSCOPY/HOLMIUM LASER/STENT PLACEMENT;  Surgeon: [name], MD;  Location: [facility];  Service: Urology;  Laterality: Right;  . EYE SURGERY Bilateral    Cataract Extraction with IOL  . goiter removal    . HOLMIUM LASER APPLICATION N/A 09/19/4578   Procedure:  HOLMIUM LASER APPLICATION;  Surgeon: [name], MD;  Location: [facility];  Service: Urology;  Laterality: N/A;  . PORTA CATH INSERTION N/A 09/23/2017   Procedure: PORTA CATH INSERTION;  Surgeon: Algernon Huxley, MD;  Location: [place] [facility];  Service: Cardiovascular;  Laterality: N/A;  . SPERMATOCELECTOMY    . TONSILLECTOMY    . TRANSURETHRAL RESECTION OF BLADDER TUMOR N/A 02/26/2016   Procedure: TRANSURETHRAL RESECTION OF BLADDER TUMOR (TURBT);  Surgeon: [name], MD;  Location: [facility];  Service: Urology;  Laterality: N/A;  . TRANSURETHRAL RESECTION OF BLADDER TUMOR WITH MITOMYCIN-C N/A 09/12/2015   Procedure: TRANSURETHRAL RESECTION OF BLADDER TUMOR ;  Surgeon: [name], MD;  Location: [facility];  Service: Urology;  Laterality: N/A;  .  TRANSURETHRAL RESECTION OF BLADDER TUMOR WITH MITOMYCIN-C N/A 03/24/2016   Procedure: TRANSURETHRAL RESECTION OF BLADDER TUMOR WITH MITOMYCIN-C  (SMALL);  Surgeon: [name], MD;  Location: [facility];  Service: Urology;  Laterality: N/A;  . URETERAL BIOPSY  Right 08/19/2017   Procedure: Renal Mass BIOPSY;  Surgeon: [name], MD;  Location: [facility];  Service: Urology;  Laterality: Right;  . URETEROSCOPY Right 01/30/2015   Procedure: URETEROSCOPY/ WITH BIOPSY AND CYTOLOGY BRUSHING;  Surgeon: [name], MD;  Location: [facility];  Service: Urology;  Laterality: Right;  . URETEROSCOPY Right 08/20/2015   Procedure: URETEROSCOPY;  Surgeon: [name], MD;  Location: [facility];  Service: Urology;  Laterality: Right;  . URETEROSCOPY Right 09/12/2015   Procedure: URETEROSCOPY;  Surgeon: [name], MD;  Location: [facility];  Service: Urology;  Laterality: Right;  . URETEROSCOPY Right 02/26/2016   Procedure: URETEROSCOPY;  Surgeon: [name], MD;  Location: [facility];  Service: Urology;  Laterality: Right;    Social History   Socioeconomic History  . Marital status: Married    Spouse name: Not on file  . Number of children: Not on file  . Years of education: Not on file  . Highest education level: Not on file  Occupational History  . Not on file  Social Needs  . Financial resource strain: Not on file  . Food insecurity:    Worry: Not on file    Inability: Not on file  . Transportation needs:    Medical: Not on file    Non-medical: Not on file  Tobacco Use  . Smoking status: Never Smoker  . Smokeless tobacco: Never Used  Substance and Sexual Activity  . Alcohol use: No    Alcohol/week: 0.0 standard drinks  . Drug use: No  . Sexual activity: Not on file  Lifestyle  . Physical activity:    Days per week: Not on file    Minutes per session: Not on file  . Stress: Not on file  Relationships  . Social connections:    Talks on phone: Not on file    Gets together: Not on file    Attends religious service: Not on file    Active member of club or organization: Not on file    Attends meetings of clubs or organizations: Not on file    Relationship status: Not on file  . Intimate partner violence:    Fear of current or ex  partner: Not on file    Emotionally abused: Not on file    Physically abused: Not on file    Forced sexual activity: Not on file  Other Topics Concern  . Not on file  Social History Narrative  . Not on file    Family History  Problem Relation Age of Onset  . Hypertension Mother   . Hypertension Father   . Prostate cancer Brother   . Kidney disease Neg Hx   . Kidney cancer Neg Hx   . Bladder Cancer Neg Hx      Current Outpatient Medications:  .  acetaminophen (TYLENOL) 500 MG tablet, Take 1,000 mg by mouth daily as needed for moderate pain. , Disp: , Rfl:  .  amLODipine (NORVASC) 5 MG tablet, Take 5 mg by mouth daily. , Disp: , Rfl:  .  benazepril-hydrochlorthiazide (LOTENSIN HCT) 20-12.5 MG tablet, Take 1 tablet by mouth daily. , Disp: , Rfl:  .  dexamethasone (DECADRON) 4 MG tablet, Take 2 tablets (8 mg total) by mouth daily. Start the day  after carboplatin chemotherapy for 2 days., Disp: 30 tablet, Rfl: 1 .  diazepam (VALIUM) 2 MG tablet, Take 5 mg by mouth at bedtime. , Disp: , Rfl:  .  docusate sodium (COLACE) 100 MG capsule, Take 100 mg by mouth 2 (two) times daily. , Disp: , Rfl:  .  fenofibrate micronized (LOFIBRA) 134 MG capsule, Take 134 mg by mouth daily before breakfast. , Disp: , Rfl:  .  fluticasone (FLONASE) 50 MCG/ACT nasal spray, Place 2 sprays into both nostrils at bedtime., Disp: , Rfl:  .  Multiple Vitamin (MULTIVITAMIN WITH MINERALS) TABS tablet, Take 1 tablet by mouth daily., Disp: , Rfl:  .  Potassium 99 MG TABS, Take 99 mg by mouth at bedtime., Disp: , Rfl:  .  Probiotic Product (PROBIOTIC-10 PO), Take by mouth every morning., Disp: , Rfl:  .  prochlorperazine (COMPAZINE) 10 MG tablet, Take 1 tablet (10 mg total) by mouth every 6 (six) hours as needed (Nausea or vomiting)., Disp: 30 tablet, Rfl: 1 .  psyllium (METAMUCIL) 58.6 % packet, Take 1 packet by mouth daily. , Disp: , Rfl:  .  tamsulosin (FLOMAX) 0.4 MG CAPS capsule, Take 1 capsule (0.4 mg total) by  mouth daily., Disp: 90 capsule, Rfl: 3 .  diazepam (VALIUM) 5 MG tablet, TAKE 1 TABLET (5 MG TOTAL) BY MOUTH EVERY 12 (TWELVE) HOURS AS NEEDED FOR ANXIETY OR SLEEP, Disp: , Rfl:  .  HYDROcodone-acetaminophen (NORCO/VICODIN) 5-325 MG tablet, Take 1 tablet by mouth every 6 (six) hours as needed for severe pain. (Patient not taking: Reported on 04/13/2018), Disp: 30 tablet, Rfl: 0 .  hydrocortisone cream 1 %, Apply 1 application topically daily as needed for itching., Disp: , Rfl:  .  lidocaine-prilocaine (EMLA) cream, Apply to affected area once (Patient not taking: Reported on 04/13/2018), Disp: 30 g, Rfl: 3 .  LORazepam (ATIVAN) 0.5 MG tablet, Take 1 tablet (0.5 mg total) by mouth every 6 (six) hours as needed (Nausea or vomiting). (Patient not taking: Reported on 04/13/2018), Disp: 30 tablet, Rfl: 0 .  ondansetron (ZOFRAN) 8 MG tablet, Take 1 tablet (8 mg total) by mouth 2 (two) times daily as needed for refractory nausea / vomiting. Start on day 3 after carboplatin chemo. (Patient not taking: Reported on 04/13/2018), Disp: 30 tablet, Rfl: 1 No current facility-administered medications for this visit.   Facility-Administered Medications Ordered in Other Visits:  .  heparin lock flush 100 unit/mL, 500 Units, Intravenous, Once, Sindy Guadeloupe, MD  Physical exam:  Vitals:   04/13/18 0833  BP: 136/76  Pulse: 90  Resp: 18  Temp: (!) 97 F (36.1 C)  TempSrc: Tympanic  Weight: 198 lb 9.6 oz (90.1 kg)   Physical Exam HENT:     Head: Normocephalic and atraumatic.  Eyes:     Pupils: Pupils are equal, round, and reactive to light.  Neck:     Musculoskeletal: Normal range of motion.  Cardiovascular:     Rate and Rhythm: Normal rate and regular rhythm.     Heart sounds: Normal heart sounds.  Pulmonary:     Effort: Pulmonary effort is normal.     Breath sounds: Normal breath sounds.  Abdominal:     General: Bowel sounds are normal.     Palpations: Abdomen is soft.  Skin:    General: Skin is warm  and dry.  Neurological:     Mental Status: He is alert and oriented to person, place, and time.      CMP Latest Ref  Rng & Units 03/30/2018  Glucose 70 - 99 mg/dL 142(H)  BUN 8 - 23 mg/dL 16  Creatinine 0.61 - 1.24 mg/dL 1.09  Sodium 135 - 145 mmol/L 132(L)  Potassium 3.5 - 5.1 mmol/L 3.9  Chloride 98 - 111 mmol/L 98  CO2 22 - 32 mmol/L 27  Calcium 8.9 - 10.3 mg/dL 8.9  Total Protein 6.5 - 8.1 g/dL 6.8  Total Bilirubin 0.3 - 1.2 mg/dL 0.6  Alkaline Phos 38 - 126 U/L 88  AST 15 - 41 U/L 27  ALT 0 - 44 U/L 18   CBC Latest Ref Rng & Units 04/13/2018  WBC 4.0 - 10.5 K/uL 4.2  Hemoglobin 13.0 - 17.0 g/dL 11.4(L)  Hematocrit 39.0 - 52.0 % 34.0(L)  Platelets 150 - 400 K/uL 207      Assessment and plan- Patient is a 85 y.o. 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 He is here for on treatment assessment prior to cycle 8-day 1 of carboplatin and gemcitabine  Counts okay to proceed with cycle 8-day 1 of carboplatin and gemcitabine today.  He will get Neulasta on day 2 with this cycle.  Hemoglobin is remained stable around 11.  I will see him back in 2 weeks time for cycle 8-day 15.  I will obtain repeat scans after 9 cycles.  I discussed his ongoing penile pain and urinary hesitancy symptoms with [name].  They may be able to offer him some medications for symptomatic relief but he does not have an appointment with them until May 2020.  We will call her office and try to get him a sooner appointment.   Visit Diagnosis 1. Encounter for antineoplastic chemotherapy   2. 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 Antineoplastic chemotherapy induced anemia      Dr. Randa Evens, MD, MPH [facility] [phone] 04/13/2018 8:36 AM

## 2018-04-13 NOTE — Telephone Encounter (Signed)
App made 

## 2018-04-13 NOTE — Addendum Note (Signed)
Addended by: Luella Cook on: 04/13/2018 09:04 AM   Modules accepted: Orders

## 2018-04-15 ENCOUNTER — Inpatient Hospital Stay: Payer: Medicare HMO

## 2018-04-15 DIAGNOSIS — C669 Malignant neoplasm of unspecified ureter: Secondary | ICD-10-CM

## 2018-04-15 DIAGNOSIS — Z5111 Encounter for antineoplastic chemotherapy: Secondary | ICD-10-CM | POA: Diagnosis not present

## 2018-04-15 MED ORDER — PEGFILGRASTIM-CBQV 6 MG/0.6ML ~~LOC~~ SOSY
6.0000 mg | PREFILLED_SYRINGE | Freq: Once | SUBCUTANEOUS | Status: AC
  Administered 2018-04-15: 6 mg via SUBCUTANEOUS

## 2018-04-16 ENCOUNTER — Telehealth: Payer: Self-pay

## 2018-04-16 NOTE — Telephone Encounter (Signed)
-----   Message from [name], MD sent at 04/14/2018  4:46 PM EST ----- Please let this patient know that his stent is in good position on x-ray.  [name], MD

## 2018-04-27 ENCOUNTER — Inpatient Hospital Stay: Payer: Medicare HMO

## 2018-04-27 ENCOUNTER — Encounter: Payer: Self-pay | Admitting: Oncology

## 2018-04-27 ENCOUNTER — Inpatient Hospital Stay: Payer: Medicare HMO | Admitting: Oncology

## 2018-04-27 VITALS — BP 143/69 | HR 80 | Temp 97.2°F | Resp 18 | Wt 197.2 lb

## 2018-04-27 DIAGNOSIS — D6481 Anemia due to antineoplastic chemotherapy: Secondary | ICD-10-CM

## 2018-04-27 DIAGNOSIS — Z5111 Encounter for antineoplastic chemotherapy: Secondary | ICD-10-CM | POA: Diagnosis not present

## 2018-04-27 DIAGNOSIS — Z8249 Family history of ischemic heart disease and other diseases of the circulatory system: Secondary | ICD-10-CM

## 2018-04-27 DIAGNOSIS — C669 Malignant neoplasm of unspecified ureter: Secondary | ICD-10-CM

## 2018-04-27 DIAGNOSIS — C661 Malignant neoplasm of right ureter: Secondary | ICD-10-CM

## 2018-04-27 DIAGNOSIS — Z79899 Other long term (current) drug therapy: Secondary | ICD-10-CM

## 2018-04-27 DIAGNOSIS — C7801 Secondary malignant neoplasm of right lung: Secondary | ICD-10-CM

## 2018-04-27 DIAGNOSIS — N4889 Other specified disorders of penis: Secondary | ICD-10-CM

## 2018-04-27 DIAGNOSIS — I10 Essential (primary) hypertension: Secondary | ICD-10-CM

## 2018-04-27 DIAGNOSIS — T451X5A Adverse effect of antineoplastic and immunosuppressive drugs, initial encounter: Secondary | ICD-10-CM

## 2018-04-27 DIAGNOSIS — Z8042 Family history of malignant neoplasm of prostate: Secondary | ICD-10-CM

## 2018-04-27 LAB — COMPREHENSIVE METABOLIC PANEL
ALT: 17 U/L (ref 0–44)
ANION GAP: 8 (ref 5–15)
AST: 25 U/L (ref 15–41)
Albumin: 3.6 g/dL (ref 3.5–5.0)
Alkaline Phosphatase: 94 U/L (ref 38–126)
BUN: 16 mg/dL (ref 8–23)
CO2: 26 mmol/L (ref 22–32)
Calcium: 8.6 mg/dL — ABNORMAL LOW (ref 8.9–10.3)
Chloride: 100 mmol/L (ref 98–111)
Creatinine, Ser: 1.07 mg/dL (ref 0.61–1.24)
GFR calc Af Amer: >60 mL/min (ref >60)
GFR calc non Af Amer: >60 mL/min (ref >60)
GLUCOSE: 125 mg/dL — AB (ref 70–99)
Potassium: 3.9 mmol/L (ref 3.5–5.1)
Sodium: 134 mmol/L — ABNORMAL LOW (ref 135–145)
TOTAL PROTEIN: 6.5 g/dL (ref 6.5–8.1)
Total Bilirubin: 0.4 mg/dL (ref 0.3–1.2)

## 2018-04-27 LAB — CBC WITH DIFFERENTIAL/PLATELET
Abs Immature Granulocytes: 0.12 10*3/uL — ABNORMAL HIGH (ref 0.00–0.07)
Basophils Absolute: 0 10*3/uL (ref 0.0–0.1)
Basophils Relative: 1 %
Eosinophils Absolute: 0.2 10*3/uL (ref 0.0–0.5)
Eosinophils Relative: 2 %
HCT: 31.5 % — ABNORMAL LOW (ref 39.0–52.0)
Hemoglobin: 10.8 g/dL — ABNORMAL LOW (ref 13.0–17.0)
Immature Granulocytes: 2 %
Lymphocytes Relative: 11 %
Lymphs Abs: 0.9 10*3/uL (ref 0.7–4.0)
MCH: 31.5 pg (ref 26.0–34.0)
MCHC: 34.3 g/dL (ref 30.0–36.0)
MCV: 91.8 fL (ref 80.0–100.0)
Monocytes Absolute: 0.6 10*3/uL (ref 0.1–1.0)
Monocytes Relative: 8 %
Neutro Abs: 6.2 10*3/uL (ref 1.7–7.7)
Neutrophils Relative %: 76 %
Platelets: 251 10*3/uL (ref 150–400)
RBC: 3.43 MIL/uL — ABNORMAL LOW (ref 4.22–5.81)
RDW: 16 % — ABNORMAL HIGH (ref 11.5–15.5)
WBC: 8 10*3/uL (ref 4.0–10.5)
nRBC: 0 % (ref 0.0–0.2)

## 2018-04-27 MED ORDER — SODIUM CHLORIDE 0.9 % IV SOLN
1600.0000 mg | Freq: Once | INTRAVENOUS | Status: AC
  Administered 2018-04-27: 1600 mg via INTRAVENOUS
  Filled 2018-04-27: qty 26.3

## 2018-04-27 MED ORDER — SODIUM CHLORIDE 0.9% FLUSH
10.0000 mL | Freq: Once | INTRAVENOUS | Status: AC
  Administered 2018-04-27: 10 mL via INTRAVENOUS
  Filled 2018-04-27: qty 10

## 2018-04-27 MED ORDER — SODIUM CHLORIDE 0.9 % IV SOLN
180.0000 mg | Freq: Once | INTRAVENOUS | Status: AC
  Administered 2018-04-27: 180 mg via INTRAVENOUS
  Filled 2018-04-27: qty 18

## 2018-04-27 MED ORDER — HEPARIN SOD (PORK) LOCK FLUSH 100 UNIT/ML IV SOLN
500.0000 [IU] | Freq: Once | INTRAVENOUS | Status: AC
  Administered 2018-04-27: 500 [IU] via INTRAVENOUS

## 2018-04-27 MED ORDER — DEXAMETHASONE SODIUM PHOSPHATE 10 MG/ML IJ SOLN
10.0000 mg | Freq: Once | INTRAMUSCULAR | Status: AC
  Administered 2018-04-27: 10 mg via INTRAVENOUS
  Filled 2018-04-27: qty 1

## 2018-04-27 MED ORDER — SODIUM CHLORIDE 0.9 % IV SOLN
Freq: Once | INTRAVENOUS | Status: AC
  Administered 2018-04-27: 09:00:00 via INTRAVENOUS
  Filled 2018-04-27: qty 250

## 2018-04-27 NOTE — Addendum Note (Signed)
Addended by: Luella Cook on: 04/27/2018 08:53 AM   Modules accepted: Orders

## 2018-04-27 NOTE — Addendum Note (Signed)
Addended by: Luella Cook on: 04/27/2018 09:42 AM   Modules accepted: Orders

## 2018-04-27 NOTE — Progress Notes (Signed)
Hematology/Oncology Consult note [facility]  [phone] [phone]  Patient Care Team: Idelle Crouch, MD as PCP - General (Internal Medicine)   Name of the patient: Anthony Gonzales  [id number]  [birth year]   Date of visit: 04/27/18  Diagnosis- Metastatic urothelial carcinoma with possible mets to the obturator node. Indeterminate hilar lymph nodes and LUL lung nodule  Chief complaint/ Reason for visit-on treatment assessment prior to cycle 8-day 15 of carboplatin and gemcitabine  Heme/Onc history: patient is a 85 year old male with past medical history significant for hypertension and long-standing history of superficial bladder cancer for which he sees [name]. He has undergone TURBT as well as ureteroscopy since 2017 along with mitomycin as well in the past. Most recently he underwent CT abdomen on 08/06/2017 which showed a soft tissue mass in the right renal pelvis concerning for upper tract urothelial neoplasm. Soft tissue fullness at the right ureterovesical junction with proximal right hydroureteronephrosis. Several millimeter attenuation lesion in the pancreatic head.  He underwent diagnostic ureteroscopy and was found to have 2 high-grade lesions within his right kidney. There was a nodular high-grade appearing lesion in the right anterior renal pelvis. There was also a second ureteral tumor fungating from the right ureteral orifice extending into the distal ureter also consistent with high-grade invasive urothelial carcinoma. Muscle invasion could not be assessed. He also underwent a CT chest which showed a rounded nodule in the right upper lobe measuring 14 mm concerning for metastases. 2 other 3 mm lesions were also noted in the left upper lobe likely benign  Plan initially was neoadjuvant chemotherapy followed by possible surgery but given the presence of lung lesion patient has been referred to oncology for the  same.  PET/CT on 09/21/17 showed:IMPRESSION: 1. Hypermetabolic right obturator lymph node is most indicative of metastatic disease. 2. Mildly hypermetabolic left hilar lymph nodes are nonspecific. Continued attention on follow-up exams is warranted. 3. Right upper lobe pulmonary nodule shows metabolism after just above blood pool and is therefore indeterminate. Continued attention on follow-up exams is warranted. 4. Aortic atherosclerosis (ICD10-170.0). Coronary artery calcification. 5. Cholelithiasis  Carboplatin/ gemzar1 week on and one-week off as patient could not tolerate 2-week on and one week off regimen. Cycle 1 started on 09/22/2017  Interval history-he continues to have mild intermittent self-limited penile pain which comes and goes.  He used to have issues emptying his bladder which has improved after starting Myrbetriq.  Appetite is good and he denies any nausea or vomiting.  He can still walk up to 1 mile per day  ECOG PS- 1 Pain scale- 0 Opioid associated constipation- no  Review of systems- Review of Systems  Constitutional: Negative for chills, fever, malaise/fatigue and weight loss.  HENT: Negative for congestion, ear discharge and nosebleeds.   Eyes: Negative for blurred vision.  Respiratory: Negative for cough, hemoptysis, sputum production, shortness of breath and wheezing.   Cardiovascular: Negative for chest pain, palpitations, orthopnea and claudication.  Gastrointestinal: Negative for abdominal pain, blood in stool, constipation, diarrhea, heartburn, melena, nausea and vomiting.  Genitourinary: Negative for dysuria, flank pain, frequency, hematuria and urgency.  Musculoskeletal: Negative for back pain, joint pain and myalgias.  Skin: Negative for rash.  Neurological: Negative for dizziness, tingling, focal weakness, seizures, weakness and headaches.  Endo/Heme/Allergies: Does not bruise/bleed easily.  Psychiatric/Behavioral: Negative for depression and  suicidal ideas. The patient does not have insomnia.        Allergies  Allergen Reactions  . Demerol [  Meperidine] Nausea And Vomiting  . Lipitor [Atorvastatin] Swelling  . Sulfa Antibiotics Nausea And Vomiting and Rash     Past Medical History:  Diagnosis Date  . Arthritis   . Benign fibroma of prostate 08/23/2013  . BPH (benign prostatic hyperplasia)   . Calculus of kidney 08/23/2013  . Glaucoma    no drops in 3 mo pressure good, pt denies glaucoma, eye pressure has been measuring alright.  . History of kidney stones   . HLD (hyperlipidemia)   . HOH (hard of hearing)    Left Hearing Aid  . HTN (hypertension) 12/26/2014  . Hypertension   . Hyponatremia 12/26/2014  . Migraines    history of migraines when he was younger.  [name] Restless leg syndrome   . Sinus drainage   . Skin cancer   . Urothelial cancer ([place])    chemo tx's.  [name] UTI (lower urinary tract infection) 12/26/2014  . Vertigo      Past Surgical History:  Procedure Laterality Date  . COLONOSCOPY    . CYSTOSCOPY W/ RETROGRADES Right 01/30/2015   Procedure: CYSTOSCOPY WITH RETROGRADE PYELOGRAM;  Surgeon: [name], MD;  Location: [facility];  Service: Urology;  Laterality: Right;  . CYSTOSCOPY W/ RETROGRADES Bilateral 02/26/2016   Procedure: CYSTOSCOPY WITH RETROGRADE PYELOGRAM;  Surgeon: [name], MD;  Location: [facility];  Service: Urology;  Laterality: Bilateral;  . CYSTOSCOPY W/ URETERAL STENT PLACEMENT Right 08/20/2015   Procedure: CYSTOSCOPY WITH RETROGRADE PYELOGRAM/POSSIBLE URETERAL STENT PLACEMENT/BLADDER BIOPSY;  Surgeon: [name], MD;  Location: [facility];  Service: Urology;  Laterality: Right;  . CYSTOSCOPY W/ URETERAL STENT PLACEMENT Right 09/12/2015   Procedure: CYSTOSCOPY WITH STENT REPLACEMENT;  Surgeon: [name], MD;  Location: [facility];  Service: Urology;  Laterality: Right;  . CYSTOSCOPY WITH BIOPSY Right 09/12/2015   Procedure: CYSTOSCOPY WITH BLADDER AND URETERAL BIOPSY;  Surgeon:  [name], MD;  Location: [facility];  Service: Urology;  Laterality: Right;  . CYSTOSCOPY WITH STENT PLACEMENT Right 01/30/2015   Procedure: CYSTOSCOPY WITH STENT PLACEMENT;  Surgeon: [name], MD;  Location: [facility];  Service: Urology;  Laterality: Right;  . CYSTOSCOPY WITH STENT PLACEMENT Right 12/28/2017   Procedure: [place] WITH STENT Exchange;  Surgeon: [name], MD;  Location: [facility];  Service: Urology;  Laterality: Right;  . CYSTOSCOPY/URETEROSCOPY/HOLMIUM LASER/STENT PLACEMENT Right 08/19/2017   Procedure: CYSTOSCOPY/URETEROSCOPY/HOLMIUM LASER/STENT PLACEMENT;  Surgeon: [name], MD;  Location: [facility];  Service: Urology;  Laterality: Right;  . EYE SURGERY Bilateral    Cataract Extraction with IOL  . goiter removal    . HOLMIUM LASER APPLICATION N/A 9/37/1696   Procedure:  HOLMIUM LASER APPLICATION;  Surgeon: [name], MD;  Location: [facility];  Service: Urology;  Laterality: N/A;  . PORTA CATH INSERTION N/A 09/23/2017   Procedure: PORTA CATH INSERTION;  Surgeon: Algernon Huxley, MD;  Location: [facility];  Service: Cardiovascular;  Laterality: N/A;  . SPERMATOCELECTOMY    . TONSILLECTOMY    . TRANSURETHRAL RESECTION OF BLADDER TUMOR N/A 02/26/2016   Procedure: TRANSURETHRAL RESECTION OF BLADDER TUMOR (TURBT);  Surgeon: [name], MD;  Location: [facility];  Service: Urology;  Laterality: N/A;  . TRANSURETHRAL RESECTION OF BLADDER TUMOR WITH MITOMYCIN-C N/A 09/12/2015   Procedure: TRANSURETHRAL RESECTION OF BLADDER TUMOR ;  Surgeon: [name], MD;  Location: [facility];  Service: Urology;  Laterality: N/A;  . TRANSURETHRAL RESECTION OF BLADDER TUMOR WITH MITOMYCIN-C N/A 03/24/2016   Procedure: TRANSURETHRAL RESECTION OF BLADDER TUMOR WITH MITOMYCIN-C  (  SMALL);  Surgeon: [name], MD;  Location: [facility];  Service: Urology;  Laterality: N/A;  . URETERAL BIOPSY Right 08/19/2017   Procedure: Renal Mass BIOPSY;  Surgeon: [name], MD;   Location: [facility];  Service: Urology;  Laterality: Right;  . URETEROSCOPY Right 01/30/2015   Procedure: URETEROSCOPY/ WITH BIOPSY AND CYTOLOGY BRUSHING;  Surgeon: [name], MD;  Location: [facility];  Service: Urology;  Laterality: Right;  . URETEROSCOPY Right 08/20/2015   Procedure: URETEROSCOPY;  Surgeon: [name], MD;  Location: [facility];  Service: Urology;  Laterality: Right;  . URETEROSCOPY Right 09/12/2015   Procedure: URETEROSCOPY;  Surgeon: [name], MD;  Location: [facility];  Service: Urology;  Laterality: Right;  . URETEROSCOPY Right 02/26/2016   Procedure: URETEROSCOPY;  Surgeon: [name], MD;  Location: [facility];  Service: Urology;  Laterality: Right;    Social History   Socioeconomic History  . Marital status: Married    Spouse name: Not on file  . Number of children: Not on file  . Years of education: Not on file  . Highest education level: Not on file  Occupational History  . Not on file  Social Needs  . Financial resource strain: Not on file  . Food insecurity:    Worry: Not on file    Inability: Not on file  . Transportation needs:    Medical: Not on file    Non-medical: Not on file  Tobacco Use  . Smoking status: Never Smoker  . Smokeless tobacco: Never Used  Substance and Sexual Activity  . Alcohol use: No    Alcohol/week: 0.0 standard drinks  . Drug use: No  . Sexual activity: Not on file  Lifestyle  . Physical activity:    Days per week: Not on file    Minutes per session: Not on file  . Stress: Not on file  Relationships  . Social connections:    Talks on phone: Not on file    Gets together: Not on file    Attends religious service: Not on file    Active member of club or organization: Not on file    Attends meetings of clubs or organizations: Not on file    Relationship status: Not on file  . Intimate partner violence:    Fear of current or ex partner: Not on file    Emotionally abused: Not on file    Physically abused:  Not on file    Forced sexual activity: Not on file  Other Topics Concern  . Not on file  Social History Narrative  . Not on file    Family History  Problem Relation Age of Onset  . Hypertension Mother   . Hypertension Father   . Prostate cancer Brother   . Kidney disease Neg Hx   . Kidney cancer Neg Hx   . Bladder Cancer Neg Hx      Current Outpatient Medications:  .  acetaminophen (TYLENOL) 500 MG tablet, Take 1,000 mg by mouth daily as needed for moderate pain. , Disp: , Rfl:  .  amLODipine (NORVASC) 5 MG tablet, Take 5 mg by mouth daily. , Disp: , Rfl:  .  benazepril-hydrochlorthiazide (LOTENSIN HCT) 20-12.5 MG tablet, Take 1 tablet by mouth daily. , Disp: , Rfl:  .  dexamethasone (DECADRON) 4 MG tablet, Take 2 tablets (8 mg total) by mouth daily. Start the day after carboplatin chemotherapy for 2 days., Disp: 30 tablet, Rfl: 1 .  diazepam (VALIUM) 5 MG tablet, TAKE 1  TABLET (5 MG TOTAL) BY MOUTH EVERY 12 (TWELVE) HOURS AS NEEDED FOR ANXIETY OR SLEEP, Disp: , Rfl:  .  docusate sodium (COLACE) 100 MG capsule, Take 100 mg by mouth 2 (two) times daily. , Disp: , Rfl:  .  fenofibrate micronized (LOFIBRA) 134 MG capsule, Take 134 mg by mouth daily before breakfast. , Disp: , Rfl:  .  fluticasone (FLONASE) 50 MCG/ACT nasal spray, Place 2 sprays into both nostrils at bedtime., Disp: , Rfl:  .  HYDROcodone-acetaminophen (NORCO/VICODIN) 5-325 MG tablet, Take 1 tablet by mouth every 6 (six) hours as needed for severe pain., Disp: 30 tablet, Rfl: 0 .  hydrocortisone cream 1 %, Apply 1 application topically daily as needed for itching., Disp: , Rfl:  .  lidocaine-prilocaine (EMLA) cream, Apply to affected area once, Disp: 30 g, Rfl: 3 .  LORazepam (ATIVAN) 0.5 MG tablet, Take 1 tablet (0.5 mg total) by mouth every 6 (six) hours as needed (Nausea or vomiting)., Disp: 30 tablet, Rfl: 0 .  Multiple Vitamin (MULTIVITAMIN WITH MINERALS) TABS tablet, Take 1 tablet by mouth daily., Disp: , Rfl:  .   ondansetron (ZOFRAN) 8 MG tablet, Take 1 tablet (8 mg total) by mouth 2 (two) times daily as needed for refractory nausea / vomiting. Start on day 3 after carboplatin chemo., Disp: 30 tablet, Rfl: 1 .  Potassium 99 MG TABS, Take 99 mg by mouth at bedtime., Disp: , Rfl:  .  Probiotic Product (PROBIOTIC-10 PO), Take by mouth every morning., Disp: , Rfl:  .  prochlorperazine (COMPAZINE) 10 MG tablet, Take 1 tablet (10 mg total) by mouth every 6 (six) hours as needed (Nausea or vomiting)., Disp: 30 tablet, Rfl: 1 .  psyllium (METAMUCIL) 58.6 % packet, Take 1 packet by mouth daily. , Disp: , Rfl:  .  tamsulosin (FLOMAX) 0.4 MG CAPS capsule, Take 1 capsule (0.4 mg total) by mouth daily., Disp: 90 capsule, Rfl: 3 .  diazepam (VALIUM) 2 MG tablet, Take 5 mg by mouth at bedtime. , Disp: , Rfl:  No current facility-administered medications for this visit.   Facility-Administered Medications Ordered in Other Visits:  .  heparin lock flush 100 unit/mL, 500 Units, Intravenous, Once, Sindy Guadeloupe, MD  Physical exam:  Vitals:   04/27/18 0827  BP: (!) 143/69  Pulse: 80  Resp: 18  Temp: (!) 97.2 F (36.2 C)  TempSrc: Tympanic  SpO2: 96%  Weight: 197 lb 4 oz (89.5 kg)   Physical Exam HENT:     Head: Normocephalic and atraumatic.  Eyes:     Pupils: Pupils are equal, round, and reactive to light.  Neck:     Musculoskeletal: Normal range of motion.  Cardiovascular:     Rate and Rhythm: Normal rate and regular rhythm.     Heart sounds: Normal heart sounds.  Pulmonary:     Effort: Pulmonary effort is normal.     Breath sounds: Normal breath sounds.  Abdominal:     General: Bowel sounds are normal.     Palpations: Abdomen is soft.  Skin:    General: Skin is warm and dry.  Neurological:     Mental Status: He is alert and oriented to person, place, and time.      CMP Latest Ref Rng & Units 04/13/2018  Glucose 70 - 99 mg/dL 120(H)  BUN 8 - 23 mg/dL 14  Creatinine 0.61 - 1.24 mg/dL 1.21    Sodium 135 - 145 mmol/L 134(L)  Potassium 3.5 - 5.1 mmol/L 3.7  Chloride 98 - 111 mmol/L 99  CO2 22 - 32 mmol/L 26  Calcium 8.9 - 10.3 mg/dL 8.9  Total Protein 6.5 - 8.1 g/dL 6.9  Total Bilirubin 0.3 - 1.2 mg/dL 0.5  Alkaline Phos 38 - 126 U/L 60  AST 15 - 41 U/L 32  ALT 0 - 44 U/L 18   CBC Latest Ref Rng & Units 04/27/2018  WBC 4.0 - 10.5 K/uL 8.0  Hemoglobin 13.0 - 17.0 g/dL 10.8(L)  Hematocrit 39.0 - 52.0 % 31.5(L)  Platelets 150 - 400 K/uL 251    No images are attached to the encounter.  Abdomen 1 View (kub)  Result Date: 04/14/2018 CLINICAL DATA:  Evaluate ureteral stent EXAM: ABDOMEN - 1 VIEW COMPARISON:  03/08/2018 CT of the abdomen FINDINGS: Scattered large and small bowel gas is noted. Right ureteral stent is noted in satisfactory position. No definitive calculi are seen. Degenerative changes of lumbar spine are noted. IMPRESSION: Right ureteral stent in satisfactory position Electronically Signed   By: Inez Catalina M.D.   On: 04/14/2018 08:48     Assessment and plan- Patient is a 85 y.o. 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  He is here for on treatment assessment prior to cycle 8-day 15 of carboplatin and gemcitabine  Counts okay to proceed with cycle 8-day 15 of carboplatin and gemcitabine today.  He does have moderate chemo induced anemia with a hemoglobin that has remained stable between 10-11.  Continue to monitor  I will see him back in 2 weeks time for cycle 9-day 1 of treatment and he will likely require Neulasta on day 3.  He has been requiring growth factor support every other cycle.  Plan to repeat scans after cycle 9  Penile pain has remained stable and he is able to empty his bladder better after starting my Bactrim.  He has an upcoming appointment with urology soon   Visit Diagnosis 1. Encounter for antineoplastic chemotherapy    2. 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 Antineoplastic chemotherapy induced anemia      Dr. Randa Evens, MD, MPH [facility] [phone] 04/27/2018 8:48 AM

## 2018-04-27 NOTE — Progress Notes (Signed)
Patient states he feels good today.  Slept well last night.  Patient's hearing aid is not working today.  States he is urinating better since last visit.  Penis pain is less also.

## 2018-05-05 ENCOUNTER — Encounter: Payer: Self-pay | Admitting: Urology

## 2018-05-05 ENCOUNTER — Ambulatory Visit: Payer: Medicare HMO | Admitting: Urology

## 2018-05-05 VITALS — BP 116/57 | HR 89 | Ht 72.0 in | Wt 196.0 lb

## 2018-05-05 DIAGNOSIS — Z8551 Personal history of malignant neoplasm of bladder: Secondary | ICD-10-CM

## 2018-05-05 DIAGNOSIS — N4889 Other specified disorders of penis: Secondary | ICD-10-CM

## 2018-05-05 LAB — URINALYSIS, COMPLETE
Bilirubin, UA: NEGATIVE
Glucose, UA: NEGATIVE
KETONES UA: NEGATIVE
Nitrite, UA: NEGATIVE
Specific Gravity, UA: 1.015 (ref 1.005–1.030)
Urobilinogen, Ur: 0.2 mg/dL (ref 0.2–1.0)
pH, UA: 6.5 (ref 5.0–7.5)

## 2018-05-05 LAB — MICROSCOPIC EXAMINATION
Bacteria, UA: NONE SEEN
EPITHELIAL CELLS (NON RENAL): NONE SEEN /HPF (ref 0–10)
RBC, UA: >30 /hpf — AB (ref 0–2)
WBC, UA: NONE SEEN /hpf (ref 0–5)

## 2018-05-05 MED ORDER — MIRABEGRON ER 50 MG PO TB24: 50.0000 mg | ORAL_TABLET | Freq: Every day | ORAL | 11 refills | Status: DC

## 2018-05-05 NOTE — Progress Notes (Signed)
° °  05/05/2018   CC:  Chief Complaint  Patient presents with   Cysto    HPI: Anthony Gonzales is a 85 yo M who returns today for cystoscopy to evaluate his bladder and urethra in the setting of bladder spasms and penile pain.  His last stent exchange was on 12/28/2017.   His KUB from 04/13/2018 showed the right ureteral stent in satisfactory position.   He reports that Myrbetriq 50 mg was initially effective but stopped working after a while. He reports of the reoccurrence of penile pain but does not need to strain as hard when urinating.   Overall, he reports that he has good days and bad days.  His good days can last up to 5 or 6 days in a row with minimal to no urinary symptoms and have exacerbation of his symptoms for a few days.  This does seem to be related to physical activity.  He does have intermittent episodes of gross hematuria.  Vitals:   05/05/18 1329  BP: (!) 116/57  Pulse: 89   NED. A&Ox3.   No respiratory distress   Abd soft, NT, ND Normal phallus with bilateral descended testicles  Cystoscopy Procedure Note  Patient identification was confirmed, informed consent was obtained, and patient was prepped using Betadine solution.  Lidocaine jelly was administered per urethral meatus.     Pre-Procedure: - Inspection reveals a normal caliber ureteral meatus.  Procedure: The flexible cystoscope was introduced without difficulty - No urethral strictures/lesions are present. - Enlarged prostate (mild)  - Normal bladder neck - Bilateral ureteral orifices identified - Bladder mucosa  reveals no ulcers, tumors, or lesions - No bladder stones - Mild to moderate trabeculation - No encrustation  - Stent in good position   Retroflexion shows no tumors.    Post-Procedure: - Patient tolerated the procedure well  Pertinent Imagings  CLINICAL DATA:  Evaluate ureteral stent  EXAM: ABDOMEN - 1 VIEW  COMPARISON:  03/08/2018 CT of the  abdomen  FINDINGS: Scattered large and small bowel gas is noted. Right ureteral stent is noted in satisfactory position. No definitive calculi are seen. Degenerative changes of lumbar spine are noted.  IMPRESSION: Right ureteral stent in satisfactory position   Electronically Signed   By: Inez Catalina M.D.   On: 04/14/2018 08:48  I have personally reviewed the images and agree with radiologist interpretation.   Assessment/ Plan:  1. Renal cell urothelial carcinoma  Plan to f/u in May for next stent exchange   2. Bladder spasm/penile pain  KUB from 04/13/2018 showed right ureteral stent in satisfactory position  Disccussed the use of Myrbetriq 50mg  on days with urinary symptoms (prn) Given his age, I am hesitant to start him on any other anticholinergics for bladder spasms due to concern for altered mental status, he is wife are agreeable with this Discussed the option of stent removal along with pros and cons; pt elected to keep stent  Pt to call back if symptoms worsen   Return for f/u in May for next stent exchange .  Dolores Frame, am acting as a scribe for Dr. [name],  I have reviewed the above documentation for accuracy and completeness, and I agree with the above.   [name], MD

## 2018-05-11 ENCOUNTER — Other Ambulatory Visit: Payer: Self-pay

## 2018-05-11 ENCOUNTER — Encounter: Payer: Self-pay | Admitting: Oncology

## 2018-05-11 ENCOUNTER — Inpatient Hospital Stay (HOSPITAL_BASED_OUTPATIENT_CLINIC_OR_DEPARTMENT_OTHER): Payer: Medicare HMO | Admitting: Oncology

## 2018-05-11 ENCOUNTER — Inpatient Hospital Stay: Payer: Medicare HMO | Attending: Oncology

## 2018-05-11 ENCOUNTER — Inpatient Hospital Stay: Payer: Medicare HMO

## 2018-05-11 VITALS — BP 97/62 | HR 92 | Temp 98.1°F | Resp 16 | Ht 72.0 in | Wt 194.3 lb

## 2018-05-11 DIAGNOSIS — Z79899 Other long term (current) drug therapy: Secondary | ICD-10-CM | POA: Diagnosis not present

## 2018-05-11 DIAGNOSIS — C7801 Secondary malignant neoplasm of right lung: Secondary | ICD-10-CM | POA: Diagnosis not present

## 2018-05-11 DIAGNOSIS — Z5111 Encounter for antineoplastic chemotherapy: Secondary | ICD-10-CM | POA: Diagnosis present

## 2018-05-11 DIAGNOSIS — N4889 Other specified disorders of penis: Secondary | ICD-10-CM

## 2018-05-11 DIAGNOSIS — C669 Malignant neoplasm of unspecified ureter: Secondary | ICD-10-CM

## 2018-05-11 DIAGNOSIS — I10 Essential (primary) hypertension: Secondary | ICD-10-CM | POA: Diagnosis not present

## 2018-05-11 DIAGNOSIS — D72819 Decreased white blood cell count, unspecified: Secondary | ICD-10-CM | POA: Diagnosis not present

## 2018-05-11 DIAGNOSIS — R530 Neoplastic (malignant) related fatigue: Secondary | ICD-10-CM | POA: Diagnosis not present

## 2018-05-11 DIAGNOSIS — Z5112 Encounter for antineoplastic immunotherapy: Secondary | ICD-10-CM | POA: Diagnosis not present

## 2018-05-11 DIAGNOSIS — N3289 Other specified disorders of bladder: Secondary | ICD-10-CM | POA: Insufficient documentation

## 2018-05-11 DIAGNOSIS — C661 Malignant neoplasm of right ureter: Secondary | ICD-10-CM | POA: Diagnosis not present

## 2018-05-11 DIAGNOSIS — Z8042 Family history of malignant neoplasm of prostate: Secondary | ICD-10-CM | POA: Insufficient documentation

## 2018-05-11 DIAGNOSIS — Z5189 Encounter for other specified aftercare: Secondary | ICD-10-CM | POA: Diagnosis present

## 2018-05-11 DIAGNOSIS — Z8249 Family history of ischemic heart disease and other diseases of the circulatory system: Secondary | ICD-10-CM | POA: Diagnosis not present

## 2018-05-11 DIAGNOSIS — E871 Hypo-osmolality and hyponatremia: Secondary | ICD-10-CM | POA: Insufficient documentation

## 2018-05-11 LAB — COMPREHENSIVE METABOLIC PANEL
ALT: 19 U/L (ref 0–44)
AST: 32 U/L (ref 15–41)
Albumin: 3.8 g/dL (ref 3.5–5.0)
Alkaline Phosphatase: 58 U/L (ref 38–126)
Anion gap: 9 (ref 5–15)
BUN: 13 mg/dL (ref 8–23)
CHLORIDE: 99 mmol/L (ref 98–111)
CO2: 26 mmol/L (ref 22–32)
Calcium: 8.9 mg/dL (ref 8.9–10.3)
Creatinine, Ser: 1.07 mg/dL (ref 0.61–1.24)
GFR calc Af Amer: >60 mL/min (ref >60)
GFR calc non Af Amer: >60 mL/min (ref >60)
Glucose, Bld: 125 mg/dL — ABNORMAL HIGH (ref 70–99)
POTASSIUM: 3.9 mmol/L (ref 3.5–5.1)
Sodium: 134 mmol/L — ABNORMAL LOW (ref 135–145)
Total Bilirubin: 0.7 mg/dL (ref 0.3–1.2)
Total Protein: 6.8 g/dL (ref 6.5–8.1)

## 2018-05-11 LAB — CBC WITH DIFFERENTIAL/PLATELET
Abs Immature Granulocytes: 0.01 10*3/uL (ref 0.00–0.07)
Basophils Absolute: 0 10*3/uL (ref 0.0–0.1)
Basophils Relative: 1 %
Eosinophils Absolute: 0.1 10*3/uL (ref 0.0–0.5)
Eosinophils Relative: 3 %
HCT: 30.5 % — ABNORMAL LOW (ref 39.0–52.0)
Hemoglobin: 10.6 g/dL — ABNORMAL LOW (ref 13.0–17.0)
Immature Granulocytes: 0 %
LYMPHS PCT: 21 %
Lymphs Abs: 0.7 10*3/uL (ref 0.7–4.0)
MCH: 31.5 pg (ref 26.0–34.0)
MCHC: 34.8 g/dL (ref 30.0–36.0)
MCV: 90.5 fL (ref 80.0–100.0)
Monocytes Absolute: 0.5 10*3/uL (ref 0.1–1.0)
Monocytes Relative: 15 %
NRBC: 0 % (ref 0.0–0.2)
Neutro Abs: 2 10*3/uL (ref 1.7–7.7)
Neutrophils Relative %: 60 %
Platelets: 240 10*3/uL (ref 150–400)
RBC: 3.37 MIL/uL — ABNORMAL LOW (ref 4.22–5.81)
RDW: 15.2 % (ref 11.5–15.5)
WBC: 3.4 10*3/uL — ABNORMAL LOW (ref 4.0–10.5)

## 2018-05-11 MED ORDER — SODIUM CHLORIDE 0.9 % IV SOLN
Freq: Once | INTRAVENOUS | Status: AC
  Administered 2018-05-11: 09:00:00 via INTRAVENOUS
  Filled 2018-05-11: qty 250

## 2018-05-11 MED ORDER — PALONOSETRON HCL INJECTION 0.25 MG/5ML
0.2500 mg | Freq: Once | INTRAVENOUS | Status: AC
  Administered 2018-05-11: 0.25 mg via INTRAVENOUS
  Filled 2018-05-11: qty 5

## 2018-05-11 MED ORDER — SODIUM CHLORIDE 0.9 % IV SOLN
1600.0000 mg | Freq: Once | INTRAVENOUS | Status: AC
  Administered 2018-05-11: 1600 mg via INTRAVENOUS
  Filled 2018-05-11: qty 42

## 2018-05-11 MED ORDER — HEPARIN SOD (PORK) LOCK FLUSH 100 UNIT/ML IV SOLN
500.0000 [IU] | Freq: Once | INTRAVENOUS | Status: AC
  Administered 2018-05-11: 500 [IU] via INTRAVENOUS
  Filled 2018-05-11: qty 5

## 2018-05-11 MED ORDER — SODIUM CHLORIDE 0.9% FLUSH
10.0000 mL | INTRAVENOUS | Status: DC | PRN
  Administered 2018-05-11: 10 mL via INTRAVENOUS
  Filled 2018-05-11: qty 10

## 2018-05-11 MED ORDER — SODIUM CHLORIDE 0.9 % IV SOLN
180.0000 mg | Freq: Once | INTRAVENOUS | Status: AC
  Administered 2018-05-11: 180 mg via INTRAVENOUS
  Filled 2018-05-11: qty 18

## 2018-05-11 MED ORDER — DEXAMETHASONE 4 MG PO TABS: 8.0000 mg | ORAL_TABLET | Freq: Every day | ORAL | 1 refills | Status: DC

## 2018-05-11 MED ORDER — DEXAMETHASONE SODIUM PHOSPHATE 10 MG/ML IJ SOLN
10.0000 mg | Freq: Once | INTRAMUSCULAR | Status: AC
  Administered 2018-05-11: 10 mg via INTRAVENOUS
  Filled 2018-05-11: qty 1

## 2018-05-11 NOTE — Progress Notes (Signed)
Patient here for pre treatment check. Patient reports that he has had a very good week. Pain 0 today.

## 2018-05-11 NOTE — Progress Notes (Signed)
Hematology/Oncology Consult note [facility]  [phone] [phone]  Patient Care Team: Idelle Crouch, MD as PCP - General (Internal Medicine)   Name of the patient: Anthony Gonzales  [id number]  1931/04/28   Date of visit: 05/11/18  Diagnosis- Metastatic urothelial carcinoma with possible mets to the obturator node. Indeterminate hilar lymph nodes and LUL lung nodule   Chief complaint/ Reason for visit-on treatment assessment prior to cycle 9-day 1 of carboplatin and gemcitabine chemotherapy  Heme/Onc history: patient is a 85 year old male with past medical history significant for hypertension and long-standing history of superficial bladder cancer for which he sees [name]. He has undergone TURBT as well as ureteroscopy since 2017 along with mitomycin as well in the past. Most recently he underwent CT abdomen on 08/06/2017 which showed a soft tissue mass in the right renal pelvis concerning for upper tract urothelial neoplasm. Soft tissue fullness at the right ureterovesical junction with proximal right hydroureteronephrosis. Several millimeter attenuation lesion in the pancreatic head.  He underwent diagnostic ureteroscopy and was found to have 2 high-grade lesions within his right kidney. There was a nodular high-grade appearing lesion in the right anterior renal pelvis. There was also a second ureteral tumor fungating from the right ureteral orifice extending into the distal ureter also consistent with high-grade invasive urothelial carcinoma. Muscle invasion could not be assessed. He also underwent a CT chest which showed a rounded nodule in the right upper lobe measuring 14 mm concerning for metastases. 2 other 3 mm lesions were also noted in the left upper lobe likely benign  Plan initially was neoadjuvant chemotherapy followed by possible surgery but given the presence of lung lesion patient has been referred to  oncology for the same.  PET/CT on 09/21/17 showed:IMPRESSION: 1. Hypermetabolic right obturator lymph node is most indicative of metastatic disease. 2. Mildly hypermetabolic left hilar lymph nodes are nonspecific. Continued attention on follow-up exams is warranted. 3. Right upper lobe pulmonary nodule shows metabolism after just above blood pool and is therefore indeterminate. Continued attention on follow-up exams is warranted. 4. Aortic atherosclerosis (ICD10-170.0). Coronary artery calcification. 5. Cholelithiasis  Carboplatin/ gemzar1 week on and one-week off as patient could not tolerate 2-week on and one week off regimen. Cycle 1 started on 09/22/2017   Interval history-reports of the last 2 weeks have been very good for him in terms of his pain.  He has hardly had any penile pain and has not even required to use his Tylenol as much.  He was given Myrbetriq by urology for his bladder spasms.  He took about 1-2 doses but did not feel much of a difference and stopped taking it  ECOG PS- 1 Pain scale- 0  Review of systems- Review of Systems  Constitutional: Negative for chills, fever, malaise/fatigue and weight loss.  HENT: Negative for congestion, ear discharge and nosebleeds.   Eyes: Negative for blurred vision.  Respiratory: Negative for cough, hemoptysis, sputum production, shortness of breath and wheezing.   Cardiovascular: Negative for chest pain, palpitations, orthopnea and claudication.  Gastrointestinal: Negative for abdominal pain, blood in stool, constipation, diarrhea, heartburn, melena, nausea and vomiting.  Genitourinary: Negative for dysuria, flank pain, frequency, hematuria and urgency.  Musculoskeletal: Negative for back pain, joint pain and myalgias.  Skin: Negative for rash.  Neurological: Negative for dizziness, tingling, focal weakness, seizures, weakness and headaches.  Endo/Heme/Allergies: Does not bruise/bleed easily.  Psychiatric/Behavioral:  Negative for depression and suicidal ideas. The patient does not have insomnia.  Allergies  Allergen Reactions  . Demerol [Meperidine] Nausea And Vomiting  . Lipitor [Atorvastatin] Swelling  . Sulfa Antibiotics Nausea And Vomiting and Rash     Past Medical History:  Diagnosis Date  . Arthritis   . Benign fibroma of prostate 08/23/2013  . BPH (benign prostatic hyperplasia)   . Calculus of kidney 08/23/2013  . Glaucoma    no drops in 3 mo pressure good, pt denies glaucoma, eye pressure has been measuring alright.  . History of kidney stones   . HLD (hyperlipidemia)   . HOH (hard of hearing)    Left Hearing Aid  . HTN (hypertension) 12/26/2014  . Hypertension   . Hyponatremia 12/26/2014  . Migraines    history of migraines when he was younger.  [name] Restless leg syndrome   . Sinus drainage   . Skin cancer   . Urothelial cancer ([place])    chemo tx's.  [name] UTI (lower urinary tract infection) 12/26/2014  . Vertigo      Past Surgical History:  Procedure Laterality Date  . COLONOSCOPY    . CYSTOSCOPY W/ RETROGRADES Right 01/30/2015   Procedure: CYSTOSCOPY WITH RETROGRADE PYELOGRAM;  Surgeon: [name], MD;  Location: [facility];  Service: Urology;  Laterality: Right;  . CYSTOSCOPY W/ RETROGRADES Bilateral 02/26/2016   Procedure: CYSTOSCOPY WITH RETROGRADE PYELOGRAM;  Surgeon: [name], MD;  Location: [facility];  Service: Urology;  Laterality: Bilateral;  . CYSTOSCOPY W/ URETERAL STENT PLACEMENT Right 08/20/2015   Procedure: CYSTOSCOPY WITH RETROGRADE PYELOGRAM/POSSIBLE URETERAL STENT PLACEMENT/BLADDER BIOPSY;  Surgeon: [name], MD;  Location: [facility];  Service: Urology;  Laterality: Right;  . CYSTOSCOPY W/ URETERAL STENT PLACEMENT Right 09/12/2015   Procedure: CYSTOSCOPY WITH STENT REPLACEMENT;  Surgeon: [name], MD;  Location: [facility];  Service: Urology;  Laterality: Right;  . CYSTOSCOPY WITH BIOPSY Right 09/12/2015   Procedure: CYSTOSCOPY WITH BLADDER AND  URETERAL BIOPSY;  Surgeon: [name], MD;  Location: [facility];  Service: Urology;  Laterality: Right;  . CYSTOSCOPY WITH STENT PLACEMENT Right 01/30/2015   Procedure: CYSTOSCOPY WITH STENT PLACEMENT;  Surgeon: [name], MD;  Location: [facility];  Service: Urology;  Laterality: Right;  . CYSTOSCOPY WITH STENT PLACEMENT Right 12/28/2017   Procedure: [place] WITH STENT Exchange;  Surgeon: [name], MD;  Location: [facility];  Service: Urology;  Laterality: Right;  . CYSTOSCOPY/URETEROSCOPY/HOLMIUM LASER/STENT PLACEMENT Right 08/19/2017   Procedure: CYSTOSCOPY/URETEROSCOPY/HOLMIUM LASER/STENT PLACEMENT;  Surgeon: [name], MD;  Location: [facility];  Service: Urology;  Laterality: Right;  . EYE SURGERY Bilateral    Cataract Extraction with IOL  . goiter removal    . HOLMIUM LASER APPLICATION N/A 5/46/2703   Procedure:  HOLMIUM LASER APPLICATION;  Surgeon: [name], MD;  Location: [facility];  Service: Urology;  Laterality: N/A;  . PORTA CATH INSERTION N/A 09/23/2017   Procedure: PORTA CATH INSERTION;  Surgeon: Algernon Huxley, MD;  Location: [facility];  Service: Cardiovascular;  Laterality: N/A;  . SPERMATOCELECTOMY    . TONSILLECTOMY    . TRANSURETHRAL RESECTION OF BLADDER TUMOR N/A 02/26/2016   Procedure: TRANSURETHRAL RESECTION OF BLADDER TUMOR (TURBT);  Surgeon: [name], MD;  Location: [facility];  Service: Urology;  Laterality: N/A;  . TRANSURETHRAL RESECTION OF BLADDER TUMOR WITH MITOMYCIN-C N/A 09/12/2015   Procedure: TRANSURETHRAL RESECTION OF BLADDER TUMOR ;  Surgeon: [name], MD;  Location: [facility];  Service: Urology;  Laterality: N/A;  . TRANSURETHRAL RESECTION OF BLADDER TUMOR WITH MITOMYCIN-C N/A 03/24/2016   Procedure: TRANSURETHRAL  RESECTION OF BLADDER TUMOR WITH MITOMYCIN-C  (SMALL);  Surgeon: [name], MD;  Location: [facility];  Service: Urology;  Laterality: N/A;  . URETERAL BIOPSY Right 08/19/2017   Procedure: Renal Mass BIOPSY;   Surgeon: [name], MD;  Location: [facility];  Service: Urology;  Laterality: Right;  . URETEROSCOPY Right 01/30/2015   Procedure: URETEROSCOPY/ WITH BIOPSY AND CYTOLOGY BRUSHING;  Surgeon: [name], MD;  Location: [facility];  Service: Urology;  Laterality: Right;  . URETEROSCOPY Right 08/20/2015   Procedure: URETEROSCOPY;  Surgeon: [name], MD;  Location: [facility];  Service: Urology;  Laterality: Right;  . URETEROSCOPY Right 09/12/2015   Procedure: URETEROSCOPY;  Surgeon: [name], MD;  Location: [facility];  Service: Urology;  Laterality: Right;  . URETEROSCOPY Right 02/26/2016   Procedure: URETEROSCOPY;  Surgeon: [name], MD;  Location: [facility];  Service: Urology;  Laterality: Right;    Social History   Socioeconomic History  . Marital status: Married    Spouse name: Not on file  . Number of children: Not on file  . Years of education: Not on file  . Highest education level: Not on file  Occupational History  . Not on file  Social Needs  . Financial resource strain: Not on file  . Food insecurity:    Worry: Not on file    Inability: Not on file  . Transportation needs:    Medical: Not on file    Non-medical: Not on file  Tobacco Use  . Smoking status: Never Smoker  . Smokeless tobacco: Never Used  Substance and Sexual Activity  . Alcohol use: No    Alcohol/week: 0.0 standard drinks  . Drug use: No  . Sexual activity: Not on file  Lifestyle  . Physical activity:    Days Anthony week: Not on file    Minutes Anthony session: Not on file  . Stress: Not on file  Relationships  . Social connections:    Talks on phone: Not on file    Gets together: Not on file    Attends religious service: Not on file    Active member of club or organization: Not on file    Attends meetings of clubs or organizations: Not on file    Relationship status: Not on file  . Intimate partner violence:    Fear of current or ex partner: Not on file    Emotionally abused: Not on  file    Physically abused: Not on file    Forced sexual activity: Not on file  Other Topics Concern  . Not on file  Social History Narrative  . Not on file    Family History  Problem Relation Age of Onset  . Hypertension Mother   . Hypertension Father   . Prostate cancer Brother   . Kidney disease Neg Hx   . Kidney cancer Neg Hx   . Bladder Cancer Neg Hx      Current Outpatient Medications:  .  acetaminophen (TYLENOL) 500 MG tablet, Take 1,000 mg by mouth daily as needed for moderate pain. , Disp: , Rfl:  .  amLODipine (NORVASC) 5 MG tablet, Take 5 mg by mouth daily. , Disp: , Rfl:  .  benazepril-hydrochlorthiazide (LOTENSIN HCT) 20-12.5 MG tablet, Take 1 tablet by mouth daily. , Disp: , Rfl:  .  dexamethasone (DECADRON) 4 MG tablet, Take 2 tablets (8 mg total) by mouth daily. Start the day after carboplatin chemotherapy for 2 days., Disp: 30 tablet, Rfl: 1 .  diazepam (VALIUM) 5 MG tablet, TAKE 1 TABLET (5 MG TOTAL) BY MOUTH EVERY 12 (TWELVE) HOURS AS NEEDED FOR ANXIETY OR SLEEP, Disp: , Rfl:  .  docusate sodium (COLACE) 100 MG capsule, Take 100 mg by mouth 2 (two) times daily. , Disp: , Rfl:  .  fenofibrate micronized (LOFIBRA) 134 MG capsule, Take 134 mg by mouth daily before breakfast. , Disp: , Rfl:  .  fluticasone (FLONASE) 50 MCG/ACT nasal spray, Place 2 sprays into both nostrils at bedtime., Disp: , Rfl:  .  HYDROcodone-acetaminophen (NORCO/VICODIN) 5-325 MG tablet, Take 1 tablet by mouth every 6 (six) hours as needed for severe pain., Disp: 30 tablet, Rfl: 0 .  hydrocortisone cream 1 %, Apply 1 application topically daily as needed for itching., Disp: , Rfl:  .  lidocaine-prilocaine (EMLA) cream, Apply to affected area once, Disp: 30 g, Rfl: 3 .  LORazepam (ATIVAN) 0.5 MG tablet, Take 1 tablet (0.5 mg total) by mouth every 6 (six) hours as needed (Nausea or vomiting)., Disp: 30 tablet, Rfl: 0 .  mirabegron ER (MYRBETRIQ) 50 MG TB24 tablet, Take 1 tablet (50 mg total) by  mouth daily., Disp: 30 tablet, Rfl: 11 .  Multiple Vitamin (MULTIVITAMIN WITH MINERALS) TABS tablet, Take 1 tablet by mouth daily., Disp: , Rfl:  .  ondansetron (ZOFRAN) 8 MG tablet, Take 1 tablet (8 mg total) by mouth 2 (two) times daily as needed for refractory nausea / vomiting. Start on day 3 after carboplatin chemo., Disp: 30 tablet, Rfl: 1 .  Potassium 99 MG TABS, Take 99 mg by mouth at bedtime., Disp: , Rfl:  .  Probiotic Product (PROBIOTIC-10 PO), Take by mouth every morning., Disp: , Rfl:  .  prochlorperazine (COMPAZINE) 10 MG tablet, Take 1 tablet (10 mg total) by mouth every 6 (six) hours as needed (Nausea or vomiting)., Disp: 30 tablet, Rfl: 1 .  psyllium (METAMUCIL) 58.6 % packet, Take 1 packet by mouth daily. , Disp: , Rfl:  .  tamsulosin (FLOMAX) 0.4 MG CAPS capsule, Take 1 capsule (0.4 mg total) by mouth daily., Disp: 90 capsule, Rfl: 3 No current facility-administered medications for this visit.   Facility-Administered Medications Ordered in Other Visits:  .  sodium chloride flush (NS) 0.9 % injection 10 mL, 10 mL, Intravenous, PRN, Sindy Guadeloupe, MD, 10 mL at 05/11/18 0815  Physical exam:  Vitals:   05/11/18 0828 05/11/18 0833  BP:  97/62  Pulse:  92  Resp: 16   Temp:  98.1 F (36.7 C)  TempSrc:  Tympanic  Weight: 194 lb 4.8 oz (88.1 kg)   Height: 6' (1.829 m)    Physical Exam Constitutional:      General: He is not in acute distress. HENT:     Head: Normocephalic and atraumatic.  Eyes:     Pupils: Pupils are equal, round, and reactive to light.  Neck:     Musculoskeletal: Normal range of motion.  Cardiovascular:     Rate and Rhythm: Normal rate and regular rhythm.     Heart sounds: Normal heart sounds.  Pulmonary:     Effort: Pulmonary effort is normal.     Breath sounds: Normal breath sounds.  Abdominal:     General: Bowel sounds are normal.     Palpations: Abdomen is soft.  Skin:    General: Skin is warm and dry.  Neurological:     Mental Status: He  is alert and oriented to person, place, and time.      CMP  Latest Ref Rng & Units 05/11/2018  Glucose 70 - 99 mg/dL 125(H)  BUN 8 - 23 mg/dL 13  Creatinine 0.61 - 1.24 mg/dL 1.07  Sodium 135 - 145 mmol/L 134(L)  Potassium 3.5 - 5.1 mmol/L 3.9  Chloride 98 - 111 mmol/L 99  CO2 22 - 32 mmol/L 26  Calcium 8.9 - 10.3 mg/dL 8.9  Total Protein 6.5 - 8.1 g/dL 6.8  Total Bilirubin 0.3 - 1.2 mg/dL 0.7  Alkaline Phos 38 - 126 U/L 58  AST 15 - 41 U/L 32  ALT 0 - 44 U/L 19   CBC Latest Ref Rng & Units 05/11/2018  WBC 4.0 - 10.5 K/uL 3.4(L)  Hemoglobin 13.0 - 17.0 g/dL 10.6(L)  Hematocrit 39.0 - 52.0 % 30.5(L)  Platelets 150 - 400 K/uL 240    No images are attached to the encounter.  Abdomen 1 View (kub)  Result Date: 04/14/2018 CLINICAL DATA:  Evaluate ureteral stent EXAM: ABDOMEN - 1 VIEW COMPARISON:  03/08/2018 CT of the abdomen FINDINGS: Scattered large and small bowel gas is noted. Right ureteral stent is noted in satisfactory position. No definitive calculi are seen. Degenerative changes of lumbar spine are noted. IMPRESSION: Right ureteral stent in satisfactory position Electronically Signed   By: Inez Catalina M.D.   On: 04/14/2018 08:48     Assessment and plan- Patient is a 85 y.o. 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   He is here for on treatment assessment prior to cycle 9-day 1 of carboplatin and gemcitabine  Counts okay to proceed with cycle 9-day 1 of carboplatin and gemcitabine today.  He has mild leukopenia with a white count of 3.4.  His ANC is 2.  He will receive udencya on day 3.  I will see him back in 2 weeks for cycle 9-day 15 of chemotherapy.  I will plan to repeat CT chest abdomen and pelvis 3 weeks from now.  We will renew his Decadron today   Visit Diagnosis 1. Encounter for antineoplastic chemotherapy   2. Urothelial carcinoma of distal  ureter ([place])      Dr. Randa Evens, MD, MPH [facility] [phone] 05/11/2018 12:29 PM

## 2018-05-12 ENCOUNTER — Inpatient Hospital Stay: Payer: Medicare HMO

## 2018-05-12 DIAGNOSIS — Z5112 Encounter for antineoplastic immunotherapy: Secondary | ICD-10-CM | POA: Diagnosis not present

## 2018-05-12 DIAGNOSIS — C669 Malignant neoplasm of unspecified ureter: Secondary | ICD-10-CM

## 2018-05-12 MED ORDER — PEGFILGRASTIM-CBQV 6 MG/0.6ML ~~LOC~~ SOSY
6.0000 mg | PREFILLED_SYRINGE | Freq: Once | SUBCUTANEOUS | Status: AC
  Administered 2018-05-12: 6 mg via SUBCUTANEOUS

## 2018-05-24 ENCOUNTER — Other Ambulatory Visit: Payer: Self-pay

## 2018-05-24 DIAGNOSIS — C669 Malignant neoplasm of unspecified ureter: Secondary | ICD-10-CM

## 2018-05-25 ENCOUNTER — Inpatient Hospital Stay: Payer: Medicare HMO | Admitting: Oncology

## 2018-05-25 ENCOUNTER — Other Ambulatory Visit: Payer: Self-pay

## 2018-05-25 ENCOUNTER — Inpatient Hospital Stay: Payer: Medicare HMO

## 2018-05-25 VITALS — BP 95/67 | HR 102 | Temp 97.4°F | Resp 18 | Wt 195.0 lb

## 2018-05-25 DIAGNOSIS — C661 Malignant neoplasm of right ureter: Secondary | ICD-10-CM

## 2018-05-25 DIAGNOSIS — C7801 Secondary malignant neoplasm of right lung: Secondary | ICD-10-CM

## 2018-05-25 DIAGNOSIS — E871 Hypo-osmolality and hyponatremia: Secondary | ICD-10-CM

## 2018-05-25 DIAGNOSIS — N3289 Other specified disorders of bladder: Secondary | ICD-10-CM

## 2018-05-25 DIAGNOSIS — D72819 Decreased white blood cell count, unspecified: Secondary | ICD-10-CM | POA: Diagnosis not present

## 2018-05-25 DIAGNOSIS — Z5111 Encounter for antineoplastic chemotherapy: Secondary | ICD-10-CM

## 2018-05-25 DIAGNOSIS — I10 Essential (primary) hypertension: Secondary | ICD-10-CM

## 2018-05-25 DIAGNOSIS — C669 Malignant neoplasm of unspecified ureter: Secondary | ICD-10-CM

## 2018-05-25 DIAGNOSIS — N4889 Other specified disorders of penis: Secondary | ICD-10-CM

## 2018-05-25 DIAGNOSIS — Z5112 Encounter for antineoplastic immunotherapy: Secondary | ICD-10-CM | POA: Diagnosis not present

## 2018-05-25 DIAGNOSIS — Z8249 Family history of ischemic heart disease and other diseases of the circulatory system: Secondary | ICD-10-CM

## 2018-05-25 DIAGNOSIS — Z79899 Other long term (current) drug therapy: Secondary | ICD-10-CM

## 2018-05-25 DIAGNOSIS — Z8042 Family history of malignant neoplasm of prostate: Secondary | ICD-10-CM

## 2018-05-25 LAB — CBC WITH DIFFERENTIAL/PLATELET
Abs Immature Granulocytes: 0.06 10*3/uL (ref 0.00–0.07)
Basophils Absolute: 0 10*3/uL (ref 0.0–0.1)
Basophils Relative: 1 %
Eosinophils Absolute: 0.1 10*3/uL (ref 0.0–0.5)
Eosinophils Relative: 1 %
HCT: 34.1 % — ABNORMAL LOW (ref 39.0–52.0)
Hemoglobin: 11.6 g/dL — ABNORMAL LOW (ref 13.0–17.0)
Immature Granulocytes: 1 %
Lymphocytes Relative: 14 %
Lymphs Abs: 1.1 10*3/uL (ref 0.7–4.0)
MCH: 30.9 pg (ref 26.0–34.0)
MCHC: 34 g/dL (ref 30.0–36.0)
MCV: 90.7 fL (ref 80.0–100.0)
MONO ABS: 0.6 10*3/uL (ref 0.1–1.0)
MONOS PCT: 7 %
Neutro Abs: 6.2 10*3/uL (ref 1.7–7.7)
Neutrophils Relative %: 76 %
Platelets: 253 10*3/uL (ref 150–400)
RBC: 3.76 MIL/uL — ABNORMAL LOW (ref 4.22–5.81)
RDW: 16.2 % — ABNORMAL HIGH (ref 11.5–15.5)
WBC: 8.1 10*3/uL (ref 4.0–10.5)
nRBC: 0 % (ref 0.0–0.2)

## 2018-05-25 LAB — COMPREHENSIVE METABOLIC PANEL
ALT: 18 U/L (ref 0–44)
AST: 29 U/L (ref 15–41)
Albumin: 4.1 g/dL (ref 3.5–5.0)
Alkaline Phosphatase: 95 U/L (ref 38–126)
Anion gap: 8 (ref 5–15)
BUN: 14 mg/dL (ref 8–23)
CO2: 27 mmol/L (ref 22–32)
Calcium: 9.1 mg/dL (ref 8.9–10.3)
Chloride: 96 mmol/L — ABNORMAL LOW (ref 98–111)
Creatinine, Ser: 1.23 mg/dL (ref 0.61–1.24)
GFR calc Af Amer: >60 mL/min (ref >60)
GFR calc non Af Amer: 53 mL/min — ABNORMAL LOW (ref >60)
GLUCOSE: 145 mg/dL — AB (ref 70–99)
Potassium: 3.8 mmol/L (ref 3.5–5.1)
Sodium: 131 mmol/L — ABNORMAL LOW (ref 135–145)
Total Bilirubin: 0.7 mg/dL (ref 0.3–1.2)
Total Protein: 7.1 g/dL (ref 6.5–8.1)

## 2018-05-25 MED ORDER — DEXAMETHASONE SODIUM PHOSPHATE 10 MG/ML IJ SOLN
10.0000 mg | Freq: Once | INTRAMUSCULAR | Status: AC
  Administered 2018-05-25: 10 mg via INTRAVENOUS
  Filled 2018-05-25: qty 1

## 2018-05-25 MED ORDER — SODIUM CHLORIDE 0.9% FLUSH
10.0000 mL | INTRAVENOUS | Status: DC | PRN
  Administered 2018-05-25: 10 mL
  Filled 2018-05-25: qty 10

## 2018-05-25 MED ORDER — SODIUM CHLORIDE 0.9 % IV SOLN
1600.0000 mg | Freq: Once | INTRAVENOUS | Status: AC
  Administered 2018-05-25: 1600 mg via INTRAVENOUS
  Filled 2018-05-25: qty 26.3

## 2018-05-25 MED ORDER — SODIUM CHLORIDE 0.9 % IV SOLN
158.6000 mg | Freq: Once | INTRAVENOUS | Status: AC
  Administered 2018-05-25: 160 mg via INTRAVENOUS
  Filled 2018-05-25: qty 16

## 2018-05-25 MED ORDER — SODIUM CHLORIDE 0.9 % IV SOLN
Freq: Once | INTRAVENOUS | Status: AC
  Administered 2018-05-25: 09:00:00 via INTRAVENOUS
  Filled 2018-05-25: qty 250

## 2018-05-25 MED ORDER — HEPARIN SOD (PORK) LOCK FLUSH 100 UNIT/ML IV SOLN
500.0000 [IU] | Freq: Once | INTRAVENOUS | Status: AC | PRN
  Administered 2018-05-25: 500 [IU]
  Filled 2018-05-25: qty 5

## 2018-05-25 NOTE — Progress Notes (Signed)
Here for follow up. Overall stated " I feel about the same" per wife " I think he is doing better " c/o urinary frequency at night ( yet low urine output per pt)

## 2018-05-25 NOTE — Progress Notes (Signed)
Per Judeen Hammans, Dr. Janese Banks states ok to proceed with treatment with HR of 102.

## 2018-05-26 ENCOUNTER — Other Ambulatory Visit: Payer: Self-pay

## 2018-05-26 ENCOUNTER — Other Ambulatory Visit: Payer: Self-pay | Admitting: Oncology

## 2018-05-26 ENCOUNTER — Inpatient Hospital Stay: Payer: Medicare HMO

## 2018-05-26 DIAGNOSIS — Z5112 Encounter for antineoplastic immunotherapy: Secondary | ICD-10-CM | POA: Diagnosis not present

## 2018-05-26 DIAGNOSIS — C669 Malignant neoplasm of unspecified ureter: Secondary | ICD-10-CM

## 2018-05-26 MED ORDER — PEGFILGRASTIM-CBQV 6 MG/0.6ML ~~LOC~~ SOSY
6.0000 mg | PREFILLED_SYRINGE | Freq: Once | SUBCUTANEOUS | Status: AC
  Administered 2018-05-26: 6 mg via SUBCUTANEOUS

## 2018-05-27 NOTE — Progress Notes (Addendum)
Hematology/Oncology Consult note [facility]  [phone] [phone]  Patient Care Team: Idelle Crouch, MD as PCP - General (Internal Medicine)   Name of the patient: Anthony Gonzales  [id number]  1931/05/09   Date of visit: 05/27/18  Diagnosis- Metastatic urothelial carcinoma with possible mets to the obturator node. Indeterminate hilar lymph nodes and LUL lung nodule   Chief complaint/ Reason for visit-on treatment assessment prior to cycle 9-day 15 of carboplatin and gemcitabine monotherapy  Heme/Onc history: patient is a 85 year old male with past medical history significant for hypertension and long-standing history of superficial bladder cancer for which he sees [name]. He has undergone TURBT as well as ureteroscopy since 2017 along with mitomycin as well in the past. Most recently he underwent CT abdomen on 08/06/2017 which showed a soft tissue mass in the right renal pelvis concerning for upper tract urothelial neoplasm. Soft tissue fullness at the right ureterovesical junction with proximal right hydroureteronephrosis. Several millimeter attenuation lesion in the pancreatic head.  He underwent diagnostic ureteroscopy and was found to have 2 high-grade lesions within his right kidney. There was a nodular high-grade appearing lesion in the right anterior renal pelvis. There was also a second ureteral tumor fungating from the right ureteral orifice extending into the distal ureter also consistent with high-grade invasive urothelial carcinoma. Muscle invasion could not be assessed. He also underwent a CT chest which showed a rounded nodule in the right upper lobe measuring 14 mm concerning for metastases. 2 other 3 mm lesions were also noted in the left upper lobe likely benign  Plan initially was neoadjuvant chemotherapy followed by possible surgery but given the presence of lung lesion patient has been referred to  oncology for the same.  PET/CT on 09/21/17 showed:IMPRESSION: 1. Hypermetabolic right obturator lymph node is most indicative of metastatic disease. 2. Mildly hypermetabolic left hilar lymph nodes are nonspecific. Continued attention on follow-up exams is warranted. 3. Right upper lobe pulmonary nodule shows metabolism after just above blood pool and is therefore indeterminate. Continued attention on follow-up exams is warranted. 4. Aortic atherosclerosis (ICD10-170.0). Coronary artery calcification. 5. Cholelithiasis  Carboplatin/ gemzar1 week on and one-week off as patient could not tolerate 2-week on and one week off regimen. Cycle 1 started on 09/22/2017   Interval history- he still has penile pain on and off which is self limited. He is able to pass urin normally  ECOG PS- 1 Pain scale- 0   Review of systems- Review of Systems  Constitutional: Positive for malaise/fatigue. Negative for chills, fever and weight loss.  HENT: Negative for congestion, ear discharge and nosebleeds.   Eyes: Negative for blurred vision.  Respiratory: Negative for cough, hemoptysis, sputum production, shortness of breath and wheezing.   Cardiovascular: Negative for chest pain, palpitations, orthopnea and claudication.  Gastrointestinal: Negative for abdominal pain, blood in stool, constipation, diarrhea, heartburn, melena, nausea and vomiting.  Genitourinary: Negative for dysuria, flank pain, frequency, hematuria and urgency.  Musculoskeletal: Negative for back pain, joint pain and myalgias.  Skin: Negative for rash.  Neurological: Negative for dizziness, tingling, focal weakness, seizures, weakness and headaches.  Endo/Heme/Allergies: Does not bruise/bleed easily.  Psychiatric/Behavioral: Negative for depression and suicidal ideas. The patient does not have insomnia.       Allergies  Allergen Reactions  . Demerol [Meperidine] Nausea And Vomiting  . Lipitor [Atorvastatin] Swelling  .  Sulfa Antibiotics Nausea And Vomiting and Rash     Past Medical History:  Diagnosis Date  .  Arthritis   . Benign fibroma of prostate 08/23/2013  . BPH (benign prostatic hyperplasia)   . Calculus of kidney 08/23/2013  . Glaucoma    no drops in 3 mo pressure good, pt denies glaucoma, eye pressure has been measuring alright.  . History of kidney stones   . HLD (hyperlipidemia)   . HOH (hard of hearing)    Left Hearing Aid  . HTN (hypertension) 12/26/2014  . Hypertension   . Hyponatremia 12/26/2014  . Migraines    history of migraines when he was younger.  [name] Restless leg syndrome   . Sinus drainage   . Skin cancer   . Urothelial cancer ([place])    chemo tx's.  [name] UTI (lower urinary tract infection) 12/26/2014  . Vertigo      Past Surgical History:  Procedure Laterality Date  . COLONOSCOPY    . CYSTOSCOPY W/ RETROGRADES Right 01/30/2015   Procedure: CYSTOSCOPY WITH RETROGRADE PYELOGRAM;  Surgeon: [name], MD;  Location: [facility];  Service: Urology;  Laterality: Right;  . CYSTOSCOPY W/ RETROGRADES Bilateral 02/26/2016   Procedure: CYSTOSCOPY WITH RETROGRADE PYELOGRAM;  Surgeon: [name], MD;  Location: [facility];  Service: Urology;  Laterality: Bilateral;  . CYSTOSCOPY W/ URETERAL STENT PLACEMENT Right 08/20/2015   Procedure: CYSTOSCOPY WITH RETROGRADE PYELOGRAM/POSSIBLE URETERAL STENT PLACEMENT/BLADDER BIOPSY;  Surgeon: [name], MD;  Location: [facility];  Service: Urology;  Laterality: Right;  . CYSTOSCOPY W/ URETERAL STENT PLACEMENT Right 09/12/2015   Procedure: CYSTOSCOPY WITH STENT REPLACEMENT;  Surgeon: [name], MD;  Location: [facility];  Service: Urology;  Laterality: Right;  . CYSTOSCOPY WITH BIOPSY Right 09/12/2015   Procedure: CYSTOSCOPY WITH BLADDER AND URETERAL BIOPSY;  Surgeon: [name], MD;  Location: [facility];  Service: Urology;  Laterality: Right;  . CYSTOSCOPY WITH STENT PLACEMENT Right 01/30/2015   Procedure: CYSTOSCOPY WITH STENT PLACEMENT;   Surgeon: [name], MD;  Location: [facility];  Service: Urology;  Laterality: Right;  . CYSTOSCOPY WITH STENT PLACEMENT Right 12/28/2017   Procedure: [place] WITH STENT Exchange;  Surgeon: [name], MD;  Location: [facility];  Service: Urology;  Laterality: Right;  . CYSTOSCOPY/URETEROSCOPY/HOLMIUM LASER/STENT PLACEMENT Right 08/19/2017   Procedure: CYSTOSCOPY/URETEROSCOPY/HOLMIUM LASER/STENT PLACEMENT;  Surgeon: [name], MD;  Location: [facility];  Service: Urology;  Laterality: Right;  . EYE SURGERY Bilateral    Cataract Extraction with IOL  . goiter removal    . HOLMIUM LASER APPLICATION N/A 0/03/7492   Procedure:  HOLMIUM LASER APPLICATION;  Surgeon: [name], MD;  Location: [facility];  Service: Urology;  Laterality: N/A;  . PORTA CATH INSERTION N/A 09/23/2017   Procedure: PORTA CATH INSERTION;  Surgeon: Algernon Huxley, MD;  Location: [facility];  Service: Cardiovascular;  Laterality: N/A;  . SPERMATOCELECTOMY    . TONSILLECTOMY    . TRANSURETHRAL RESECTION OF BLADDER TUMOR N/A 02/26/2016   Procedure: TRANSURETHRAL RESECTION OF BLADDER TUMOR (TURBT);  Surgeon: [name], MD;  Location: [facility];  Service: Urology;  Laterality: N/A;  . TRANSURETHRAL RESECTION OF BLADDER TUMOR WITH MITOMYCIN-C N/A 09/12/2015   Procedure: TRANSURETHRAL RESECTION OF BLADDER TUMOR ;  Surgeon: [name], MD;  Location: [facility];  Service: Urology;  Laterality: N/A;  . TRANSURETHRAL RESECTION OF BLADDER TUMOR WITH MITOMYCIN-C N/A 03/24/2016   Procedure: TRANSURETHRAL RESECTION OF BLADDER TUMOR WITH MITOMYCIN-C  (SMALL);  Surgeon: [name], MD;  Location: [facility];  Service: Urology;  Laterality: N/A;  . URETERAL BIOPSY Right 08/19/2017   Procedure: Renal Mass BIOPSY;  Surgeon:  [name], MD;  Location: [facility];  Service: Urology;  Laterality: Right;  . URETEROSCOPY Right 01/30/2015   Procedure: URETEROSCOPY/ WITH BIOPSY AND CYTOLOGY BRUSHING;  Surgeon: [name], MD;  Location: [facility];  Service: Urology;  Laterality: Right;  . URETEROSCOPY Right 08/20/2015   Procedure: URETEROSCOPY;  Surgeon: [name], MD;  Location: [facility];  Service: Urology;  Laterality: Right;  . URETEROSCOPY Right 09/12/2015   Procedure: URETEROSCOPY;  Surgeon: [name], MD;  Location: [facility];  Service: Urology;  Laterality: Right;  . URETEROSCOPY Right 02/26/2016   Procedure: URETEROSCOPY;  Surgeon: [name], MD;  Location: [facility];  Service: Urology;  Laterality: Right;    Social History   Socioeconomic History  . Marital status: Married    Spouse name: Not on file  . Number of children: Not on file  . Years of education: Not on file  . Highest education level: Not on file  Occupational History  . Not on file  Social Needs  . Financial resource strain: Not on file  . Food insecurity:    Worry: Not on file    Inability: Not on file  . Transportation needs:    Medical: Not on file    Non-medical: Not on file  Tobacco Use  . Smoking status: Never Smoker  . Smokeless tobacco: Never Used  Substance and Sexual Activity  . Alcohol use: No    Alcohol/week: 0.0 standard drinks  . Drug use: No  . Sexual activity: Not on file  Lifestyle  . Physical activity:    Days per week: Not on file    Minutes per session: Not on file  . Stress: Not on file  Relationships  . Social connections:    Talks on phone: Not on file    Gets together: Not on file    Attends religious service: Not on file    Active member of club or organization: Not on file    Attends meetings of clubs or organizations: Not on file    Relationship status: Not on file  . Intimate partner violence:    Fear of current or ex partner: Not on file    Emotionally abused: Not on file    Physically abused: Not on file    Forced sexual activity: Not on file  Other Topics Concern  . Not on file  Social History Narrative  . Not on file    Family History  Problem Relation  Age of Onset  . Hypertension Mother   . Hypertension Father   . Prostate cancer Brother   . Kidney disease Neg Hx   . Kidney cancer Neg Hx   . Bladder Cancer Neg Hx      Current Outpatient Medications:  .  amLODipine (NORVASC) 5 MG tablet, Take 5 mg by mouth daily. , Disp: , Rfl:  .  benazepril-hydrochlorthiazide (LOTENSIN HCT) 20-12.5 MG tablet, Take 1 tablet by mouth daily. , Disp: , Rfl:  .  dexamethasone (DECADRON) 4 MG tablet, Take 2 tablets (8 mg total) by mouth daily. Start the day after carboplatin chemotherapy for 2 days., Disp: 30 tablet, Rfl: 1 .  diazepam (VALIUM) 5 MG tablet, TAKE 1 TABLET (5 MG TOTAL) BY MOUTH EVERY 12 (TWELVE) HOURS AS NEEDED FOR ANXIETY OR SLEEP, Disp: , Rfl:  .  docusate sodium (COLACE) 100 MG capsule, Take 100 mg by mouth 2 (two) times daily. , Disp: , Rfl:  .  fenofibrate micronized (LOFIBRA) 134 MG capsule, Take 134 mg  by mouth daily before breakfast. , Disp: , Rfl:  .  fluticasone (FLONASE) 50 MCG/ACT nasal spray, Place 2 sprays into both nostrils at bedtime., Disp: , Rfl:  .  HYDROcodone-acetaminophen (NORCO/VICODIN) 5-325 MG tablet, Take 1 tablet by mouth every 6 (six) hours as needed for severe pain., Disp: 30 tablet, Rfl: 0 .  hydrocortisone cream 1 %, Apply 1 application topically daily as needed for itching., Disp: , Rfl:  .  lidocaine-prilocaine (EMLA) cream, Apply to affected area once, Disp: 30 g, Rfl: 3 .  LORazepam (ATIVAN) 0.5 MG tablet, Take 1 tablet (0.5 mg total) by mouth every 6 (six) hours as needed (Nausea or vomiting)., Disp: 30 tablet, Rfl: 0 .  mirabegron ER (MYRBETRIQ) 50 MG TB24 tablet, Take 1 tablet (50 mg total) by mouth daily., Disp: 30 tablet, Rfl: 11 .  Multiple Vitamin (MULTIVITAMIN WITH MINERALS) TABS tablet, Take 1 tablet by mouth daily., Disp: , Rfl:  .  Potassium 99 MG TABS, Take 99 mg by mouth at bedtime., Disp: , Rfl:  .  Probiotic Product (PROBIOTIC-10 PO), Take by mouth every morning., Disp: , Rfl:  .  psyllium  (METAMUCIL) 58.6 % packet, Take 1 packet by mouth daily. , Disp: , Rfl:  .  tamsulosin (FLOMAX) 0.4 MG CAPS capsule, Take 1 capsule (0.4 mg total) by mouth daily., Disp: 90 capsule, Rfl: 3 .  acetaminophen (TYLENOL) 500 MG tablet, Take 1,000 mg by mouth daily as needed for moderate pain. , Disp: , Rfl:  .  ondansetron (ZOFRAN) 8 MG tablet, Take 1 tablet (8 mg total) by mouth 2 (two) times daily as needed for refractory nausea / vomiting. Start on day 3 after carboplatin chemo. (Patient not taking: Reported on 05/25/2018), Disp: 30 tablet, Rfl: 1 .  prochlorperazine (COMPAZINE) 10 MG tablet, Take 1 tablet (10 mg total) by mouth every 6 (six) hours as needed (Nausea or vomiting). (Patient not taking: Reported on 05/25/2018), Disp: 30 tablet, Rfl: 1  Physical exam:  Vitals:   05/25/18 0834  BP: 95/67  Pulse: (!) 102  Resp: 18  Temp: (!) 97.4 F (36.3 C)  TempSrc: Tympanic  Weight: 195 lb (88.5 kg)   Physical Exam HENT:     Head: Normocephalic and atraumatic.  Eyes:     Pupils: Pupils are equal, round, and reactive to light.  Neck:     Musculoskeletal: Normal range of motion.  Cardiovascular:     Rate and Rhythm: Normal rate and regular rhythm.     Heart sounds: Normal heart sounds.  Pulmonary:     Effort: Pulmonary effort is normal.     Breath sounds: Normal breath sounds.  Abdominal:     General: Bowel sounds are normal.     Palpations: Abdomen is soft.  Skin:    General: Skin is warm and dry.  Neurological:     Mental Status: He is alert and oriented to person, place, and time.      CMP Latest Ref Rng & Units 05/25/2018  Glucose 70 - 99 mg/dL 145(H)  BUN 8 - 23 mg/dL 14  Creatinine 0.61 - 1.24 mg/dL 1.23  Sodium 135 - 145 mmol/L 131(L)  Potassium 3.5 - 5.1 mmol/L 3.8  Chloride 98 - 111 mmol/L 96(L)  CO2 22 - 32 mmol/L 27  Calcium 8.9 - 10.3 mg/dL 9.1  Total Protein 6.5 - 8.1 g/dL 7.1  Total Bilirubin 0.3 - 1.2 mg/dL 0.7  Alkaline Phos 38 - 126 U/L 95  AST 15 - 41 U/L  29  ALT 0 - 44 U/L 18   CBC Latest Ref Rng & Units 05/25/2018  WBC 4.0 - 10.5 K/uL 8.1  Hemoglobin 13.0 - 17.0 g/dL 11.6(L)  Hematocrit 39.0 - 52.0 % 34.1(L)  Platelets 150 - 400 K/uL 253      Assessment and plan- Patient is a 85 y.o. 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   He is here for on treatment assessment prior to cycle 9-day 15 of carboplatin and gemcitabine  Counts okay to proceed with cycle 9-day 15 of carboplatin and gemcitabine today.  He will receive udencya on day 3.  Overall he is tolerating chemotherapy very well with minimal side effects.  He is scheduled for repeat CT chest abdomen and pelvis on 06/02/2018.  I will tentatively see him back in 2 weeks time with CBC with differential and CMP for cycle 10-day 1 of carboplatin and gemcitabine  Hyponatremia: He will receive 1 L of IV fluids today    Visit Diagnosis 1. Encounter for antineoplastic chemotherapy   2. 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 Hyponatremia      Dr. Randa Evens, MD, MPH [facility] [phone] 05/27/2018 2:04 PM

## 2018-05-28 ENCOUNTER — Encounter: Payer: Self-pay | Admitting: Oncology

## 2018-06-01 ENCOUNTER — Ambulatory Visit: Payer: Medicare HMO | Admitting: Urology

## 2018-06-02 ENCOUNTER — Other Ambulatory Visit: Payer: Self-pay

## 2018-06-02 ENCOUNTER — Ambulatory Visit
Admission: RE | Admit: 2018-06-02 | Discharge: 2018-06-02 | Disposition: A | Discharge status: Home / Self Care | Payer: Medicare HMO | Source: Ambulatory Visit | Attending: Oncology | Admitting: Oncology

## 2018-06-02 DIAGNOSIS — K869 Disease of pancreas, unspecified: Secondary | ICD-10-CM | POA: Diagnosis not present

## 2018-06-02 DIAGNOSIS — C669 Malignant neoplasm of unspecified ureter: Secondary | ICD-10-CM

## 2018-06-02 DIAGNOSIS — I700 Atherosclerosis of aorta: Secondary | ICD-10-CM | POA: Insufficient documentation

## 2018-06-02 DIAGNOSIS — R918 Other nonspecific abnormal finding of lung field: Secondary | ICD-10-CM | POA: Insufficient documentation

## 2018-06-02 DIAGNOSIS — N289 Disorder of kidney and ureter, unspecified: Secondary | ICD-10-CM | POA: Diagnosis not present

## 2018-06-02 MED ORDER — IOHEXOL 300 MG/ML  SOLN
85.0000 mL | Freq: Once | INTRAMUSCULAR | Status: AC | PRN
  Administered 2018-06-02: 85 mL via INTRAVENOUS

## 2018-06-03 ENCOUNTER — Other Ambulatory Visit: Payer: Self-pay | Admitting: *Deleted

## 2018-06-03 DIAGNOSIS — C669 Malignant neoplasm of unspecified ureter: Secondary | ICD-10-CM

## 2018-06-06 ENCOUNTER — Other Ambulatory Visit: Payer: Self-pay | Admitting: Oncology

## 2018-06-06 NOTE — Progress Notes (Signed)
DISCONTINUE ON PATHWAY REGIMEN - Bladder     A cycle is every 21 days:     Carboplatin      Gemcitabine   **Always confirm dose/schedule in your pharmacy ordering system**  REASON: Disease Progression PRIOR TREATMENT: [id number]: Carboplatin AUC=5 D1 + Gemcitabine 1,000 mg/m2 D1, 8 q21 Days for a Maximum of 6 Cycles TREATMENT RESPONSE: Progressive Disease (PD)  START ON PATHWAY REGIMEN - Bladder     A cycle is 21 days:     Pembrolizumab   **Always confirm dose/schedule in your pharmacy ordering system**  Patient Characteristics: Metastatic Disease, Second Line, FGFR2/FGFR3 Mutation Negative or Unknown Therapeutic Status: Metastatic Disease Line of Therapy: Second Line FGFR2/FGFR3 Mutation Status: Negative Intent of Therapy: Non-Curative / Palliative Intent, Discussed with Patient

## 2018-06-07 ENCOUNTER — Other Ambulatory Visit: Payer: Self-pay | Admitting: *Deleted

## 2018-06-07 DIAGNOSIS — C669 Malignant neoplasm of unspecified ureter: Secondary | ICD-10-CM

## 2018-06-08 ENCOUNTER — Inpatient Hospital Stay: Payer: Medicare HMO

## 2018-06-08 ENCOUNTER — Encounter: Payer: Self-pay | Admitting: Oncology

## 2018-06-08 ENCOUNTER — Other Ambulatory Visit: Payer: Self-pay

## 2018-06-08 ENCOUNTER — Inpatient Hospital Stay: Payer: Medicare HMO | Admitting: *Deleted

## 2018-06-08 ENCOUNTER — Inpatient Hospital Stay (HOSPITAL_BASED_OUTPATIENT_CLINIC_OR_DEPARTMENT_OTHER): Payer: Medicare HMO | Admitting: Oncology

## 2018-06-08 VITALS — BP 121/75 | HR 76 | Temp 97.6°F | Resp 18 | Ht 72.0 in | Wt 193.8 lb

## 2018-06-08 DIAGNOSIS — Z5112 Encounter for antineoplastic immunotherapy: Secondary | ICD-10-CM | POA: Diagnosis not present

## 2018-06-08 DIAGNOSIS — C669 Malignant neoplasm of unspecified ureter: Secondary | ICD-10-CM

## 2018-06-08 DIAGNOSIS — N4889 Other specified disorders of penis: Secondary | ICD-10-CM

## 2018-06-08 DIAGNOSIS — Z79899 Other long term (current) drug therapy: Secondary | ICD-10-CM

## 2018-06-08 DIAGNOSIS — C7801 Secondary malignant neoplasm of right lung: Secondary | ICD-10-CM

## 2018-06-08 DIAGNOSIS — R530 Neoplastic (malignant) related fatigue: Secondary | ICD-10-CM | POA: Diagnosis not present

## 2018-06-08 DIAGNOSIS — Z95828 Presence of other vascular implants and grafts: Secondary | ICD-10-CM

## 2018-06-08 DIAGNOSIS — C661 Malignant neoplasm of right ureter: Secondary | ICD-10-CM

## 2018-06-08 DIAGNOSIS — I10 Essential (primary) hypertension: Secondary | ICD-10-CM

## 2018-06-08 DIAGNOSIS — Z7189 Other specified counseling: Secondary | ICD-10-CM

## 2018-06-08 LAB — CBC WITH DIFFERENTIAL/PLATELET
ABS IMMATURE GRANULOCYTES: 0.09 10*3/uL — AB (ref 0.00–0.07)
Basophils Absolute: 0 10*3/uL (ref 0.0–0.1)
Basophils Relative: 0 %
Eosinophils Absolute: 0 10*3/uL (ref 0.0–0.5)
Eosinophils Relative: 0 %
HCT: 32 % — ABNORMAL LOW (ref 39.0–52.0)
Hemoglobin: 11 g/dL — ABNORMAL LOW (ref 13.0–17.0)
Immature Granulocytes: 1 %
Lymphocytes Relative: 8 %
Lymphs Abs: 0.8 10*3/uL (ref 0.7–4.0)
MCH: 31.2 pg (ref 26.0–34.0)
MCHC: 34.4 g/dL (ref 30.0–36.0)
MCV: 90.7 fL (ref 80.0–100.0)
MONO ABS: 0.8 10*3/uL (ref 0.1–1.0)
Monocytes Relative: 8 %
NEUTROS ABS: 8.3 10*3/uL — AB (ref 1.7–7.7)
Neutrophils Relative %: 83 %
Platelets: 243 10*3/uL (ref 150–400)
RBC: 3.53 MIL/uL — ABNORMAL LOW (ref 4.22–5.81)
RDW: 16.6 % — ABNORMAL HIGH (ref 11.5–15.5)
WBC: 10.1 10*3/uL (ref 4.0–10.5)
nRBC: 0 % (ref 0.0–0.2)

## 2018-06-08 LAB — COMPREHENSIVE METABOLIC PANEL
ALT: 16 U/L (ref 0–44)
AST: 25 U/L (ref 15–41)
Albumin: 3.9 g/dL (ref 3.5–5.0)
Alkaline Phosphatase: 101 U/L (ref 38–126)
Anion gap: 7 (ref 5–15)
BUN: 18 mg/dL (ref 8–23)
CO2: 27 mmol/L (ref 22–32)
Calcium: 8.9 mg/dL (ref 8.9–10.3)
Chloride: 97 mmol/L — ABNORMAL LOW (ref 98–111)
Creatinine, Ser: 1.07 mg/dL (ref 0.61–1.24)
GFR calc Af Amer: >60 mL/min (ref >60)
GFR calc non Af Amer: >60 mL/min (ref >60)
Glucose, Bld: 134 mg/dL — ABNORMAL HIGH (ref 70–99)
Potassium: 4 mmol/L (ref 3.5–5.1)
Sodium: 131 mmol/L — ABNORMAL LOW (ref 135–145)
Total Bilirubin: 0.5 mg/dL (ref 0.3–1.2)
Total Protein: 6.7 g/dL (ref 6.5–8.1)

## 2018-06-08 LAB — TSH: TSH: 1.756 u[IU]/mL (ref 0.350–4.500)

## 2018-06-08 MED ORDER — SODIUM CHLORIDE 0.9 % IV SOLN
200.0000 mg | Freq: Once | INTRAVENOUS | Status: AC
  Administered 2018-06-08: 200 mg via INTRAVENOUS
  Filled 2018-06-08: qty 8

## 2018-06-08 MED ORDER — HEPARIN SOD (PORK) LOCK FLUSH 100 UNIT/ML IV SOLN
500.0000 [IU] | Freq: Once | INTRAVENOUS | Status: AC | PRN
  Administered 2018-06-08: 500 [IU]

## 2018-06-08 MED ORDER — SODIUM CHLORIDE 0.9 % IV SOLN
Freq: Once | INTRAVENOUS | Status: AC
  Administered 2018-06-08: 11:00:00 via INTRAVENOUS
  Filled 2018-06-08: qty 250

## 2018-06-08 MED ORDER — SODIUM CHLORIDE 0.9% FLUSH
10.0000 mL | Freq: Once | INTRAVENOUS | Status: AC
  Administered 2018-06-08: 10 mL via INTRAVENOUS
  Filled 2018-06-08: qty 10

## 2018-06-08 NOTE — Progress Notes (Signed)
Pt was told that we are changing chemo and md to speak to him about results

## 2018-06-10 NOTE — Progress Notes (Signed)
Hematology/Oncology Consult note [facility]  [phone] [phone]  Patient Care Team: Idelle Crouch, MD as PCP - General (Internal Medicine)   Name of the patient: Anthony Gonzales  [id number]  January 18, 1932   Date of visit: 06/10/18  Diagnosis-  Metastatic urothelial carcinoma with possible mets to the obturator node. Indeterminate hilar lymph nodes and LUL lung nodule  Chief complaint/ Reason for visit-discuss CT results and further management On treatment assessment prior to cycle 1 of Keytruda  Heme/Onc history: patient is a 85 year old male with past medical history significant for hypertension and long-standing history of superficial bladder cancer for which he sees [name]. He has undergone TURBT as well as ureteroscopy since 2017 along with mitomycin as well in the past. Most recently he underwent CT abdomen on 08/06/2017 which showed a soft tissue mass in the right renal pelvis concerning for upper tract urothelial neoplasm. Soft tissue fullness at the right ureterovesical junction with proximal right hydroureteronephrosis. Several millimeter attenuation lesion in the pancreatic head.  He underwent diagnostic ureteroscopy and was found to have 2 high-grade lesions within his right kidney. There was a nodular high-grade appearing lesion in the right anterior renal pelvis. There was also a second ureteral tumor fungating from the right ureteral orifice extending into the distal ureter also consistent with high-grade invasive urothelial carcinoma. Muscle invasion could not be assessed. He also underwent a CT chest which showed a rounded nodule in the right upper lobe measuring 14 mm concerning for metastases. 2 other 3 mm lesions were also noted in the left upper lobe likely benign  Plan initially was neoadjuvant chemotherapy followed by possible surgery but given the presence of lung lesion patient has been referred to  oncology for the same.  PET/CT on 09/21/17 showed:IMPRESSION: 1. Hypermetabolic right obturator lymph node is most indicative of metastatic disease. 2. Mildly hypermetabolic left hilar lymph nodes are nonspecific. Continued attention on follow-up exams is warranted. 3. Right upper lobe pulmonary nodule shows metabolism after just above blood pool and is therefore indeterminate. Continued attention on follow-up exams is warranted. 4. Aortic atherosclerosis (ICD10-170.0). Coronary artery calcification. 5. Cholelithiasis  Carboplatin/ gemzar1 week on and one-week off as patient could not tolerate 2-week on and one week off regimen. Cycle 1 started on 09/22/2017   Interval history- he has chronic mild fatigue and on and off penile pain. No new symptoms  ECOG PS- 1 Pain scale- 0   Review of systems- Review of Systems  Constitutional: Positive for malaise/fatigue. Negative for chills, fever and weight loss.  HENT: Negative for congestion, ear discharge and nosebleeds.   Eyes: Negative for blurred vision.  Respiratory: Negative for cough, hemoptysis, sputum production, shortness of breath and wheezing.   Cardiovascular: Negative for chest pain, palpitations, orthopnea and claudication.  Gastrointestinal: Negative for abdominal pain, blood in stool, constipation, diarrhea, heartburn, melena, nausea and vomiting.  Genitourinary: Negative for dysuria, flank pain, frequency, hematuria and urgency.  Musculoskeletal: Negative for back pain, joint pain and myalgias.  Skin: Negative for rash.  Neurological: Negative for dizziness, tingling, focal weakness, seizures, weakness and headaches.  Endo/Heme/Allergies: Does not bruise/bleed easily.  Psychiatric/Behavioral: Negative for depression and suicidal ideas. The patient does not have insomnia.       Allergies  Allergen Reactions   Demerol [Meperidine] Nausea And Vomiting   Lipitor [Atorvastatin] Swelling   Sulfa Antibiotics Nausea  And Vomiting and Rash     Past Medical History:  Diagnosis Date   Arthritis  Benign fibroma of prostate 08/23/2013   BPH (benign prostatic hyperplasia)    Calculus of kidney 08/23/2013   Glaucoma    no drops in 3 mo pressure good, pt denies glaucoma, eye pressure has been measuring alright.   History of kidney stones    HLD (hyperlipidemia)    HOH (hard of hearing)    Left Hearing Aid   HTN (hypertension) 12/26/2014   Hypertension    Hyponatremia 12/26/2014   Migraines    history of migraines when he was younger.   Restless leg syndrome    Sinus drainage    Skin cancer    Urothelial cancer ([place])    chemo tx's.   UTI (lower urinary tract infection) 12/26/2014   Vertigo      Past Surgical History:  Procedure Laterality Date   COLONOSCOPY     CYSTOSCOPY W/ RETROGRADES Right 01/30/2015   Procedure: CYSTOSCOPY WITH RETROGRADE PYELOGRAM;  Surgeon: [name], MD;  Location: [facility];  Service: Urology;  Laterality: Right;   CYSTOSCOPY W/ RETROGRADES Bilateral 02/26/2016   Procedure: CYSTOSCOPY WITH RETROGRADE PYELOGRAM;  Surgeon: [name], MD;  Location: [facility];  Service: Urology;  Laterality: Bilateral;   CYSTOSCOPY W/ URETERAL STENT PLACEMENT Right 08/20/2015   Procedure: CYSTOSCOPY WITH RETROGRADE PYELOGRAM/POSSIBLE URETERAL STENT PLACEMENT/BLADDER BIOPSY;  Surgeon: [name], MD;  Location: [facility];  Service: Urology;  Laterality: Right;   CYSTOSCOPY W/ URETERAL STENT PLACEMENT Right 09/12/2015   Procedure: CYSTOSCOPY WITH STENT REPLACEMENT;  Surgeon: [name], MD;  Location: [facility];  Service: Urology;  Laterality: Right;   CYSTOSCOPY WITH BIOPSY Right 09/12/2015   Procedure: CYSTOSCOPY WITH BLADDER AND URETERAL BIOPSY;  Surgeon: [name], MD;  Location: [facility];  Service: Urology;  Laterality: Right;   CYSTOSCOPY WITH STENT PLACEMENT Right 01/30/2015   Procedure: CYSTOSCOPY WITH STENT PLACEMENT;  Surgeon: [name],  MD;  Location: [facility];  Service: Urology;  Laterality: Right;   CYSTOSCOPY WITH STENT PLACEMENT Right 12/28/2017   Procedure: CYSTOSCOPY WITH STENT Exchange;  Surgeon: [name], MD;  Location: [facility];  Service: Urology;  Laterality: Right;   CYSTOSCOPY/URETEROSCOPY/HOLMIUM LASER/STENT PLACEMENT Right 08/19/2017   Procedure: CYSTOSCOPY/URETEROSCOPY/HOLMIUM LASER/STENT PLACEMENT;  Surgeon: [name], MD;  Location: [facility];  Service: Urology;  Laterality: Right;   EYE SURGERY Bilateral    Cataract Extraction with IOL   goiter removal     HOLMIUM LASER APPLICATION N/A 06/25/4079   Procedure:  HOLMIUM LASER APPLICATION;  Surgeon: [name], MD;  Location: [facility];  Service: Urology;  Laterality: N/A;   PORTA CATH INSERTION N/A 09/23/2017   Procedure: PORTA CATH INSERTION;  Surgeon: Algernon Huxley, MD;  Location: [place] [facility];  Service: Cardiovascular;  Laterality: N/A;   SPERMATOCELECTOMY     TONSILLECTOMY     TRANSURETHRAL RESECTION OF BLADDER TUMOR N/A 02/26/2016   Procedure: TRANSURETHRAL RESECTION OF BLADDER TUMOR (TURBT);  Surgeon: [name], MD;  Location: [facility];  Service: Urology;  Laterality: N/A;   TRANSURETHRAL RESECTION OF BLADDER TUMOR WITH MITOMYCIN-C N/A 09/12/2015   Procedure: TRANSURETHRAL RESECTION OF BLADDER TUMOR ;  Surgeon: [name], MD;  Location: [facility];  Service: Urology;  Laterality: N/A;   TRANSURETHRAL RESECTION OF BLADDER TUMOR WITH MITOMYCIN-C N/A 03/24/2016   Procedure: TRANSURETHRAL RESECTION OF BLADDER TUMOR WITH MITOMYCIN-C  (SMALL);  Surgeon: [name], MD;  Location: [facility];  Service: Urology;  Laterality: N/A;   URETERAL BIOPSY Right 08/19/2017   Procedure: Renal Mass BIOPSY;  Surgeon: [name], MD;  Location: [facility];  Service: Urology;  Laterality: Right;   URETEROSCOPY Right 01/30/2015   Procedure: URETEROSCOPY/ WITH BIOPSY AND CYTOLOGY BRUSHING;  Surgeon: [name], MD;  Location: [facility];  Service: Urology;  Laterality: Right;   URETEROSCOPY Right 08/20/2015   Procedure: URETEROSCOPY;  Surgeon: [name], MD;  Location: [facility];  Service: Urology;  Laterality: Right;   URETEROSCOPY Right 09/12/2015   Procedure: URETEROSCOPY;  Surgeon: [name], MD;  Location: [facility];  Service: Urology;  Laterality: Right;   URETEROSCOPY Right 02/26/2016   Procedure: URETEROSCOPY;  Surgeon: [name], MD;  Location: [facility];  Service: Urology;  Laterality: Right;    Social History   Socioeconomic History   Marital status: Married    Spouse name: Not on file   Number of children: Not on file   Years of education: Not on file   Highest education level: Not on file  Occupational History   Not on file  Social Needs   Financial resource strain: Not on file   Food insecurity:    Worry: Not on file    Inability: Not on file   Transportation needs:    Medical: Not on file    Non-medical: Not on file  Tobacco Use   Smoking status: Never Smoker   Smokeless tobacco: Never Used  Substance and Sexual Activity   Alcohol use: No    Alcohol/week: 0.0 standard drinks   Drug use: No   Sexual activity: Not on file  Lifestyle   Physical activity:    Days per week: Not on file    Minutes per session: Not on file   Stress: Not on file  Relationships   Social connections:    Talks on phone: Not on file    Gets together: Not on file    Attends religious service: Not on file    Active member of club or organization: Not on file    Attends meetings of clubs or organizations: Not on file    Relationship status: Not on file   Intimate partner violence:    Fear of current or ex partner: Not on file    Emotionally abused: Not on file    Physically abused: Not on file    Forced sexual activity: Not on file  Other Topics Concern   Not on file  Social History Narrative   Not on file    Family History  Problem Relation Age of Onset   Hypertension  Mother    Hypertension Father    Prostate cancer Brother    Kidney disease Neg Hx    Kidney cancer Neg Hx    Bladder Cancer Neg Hx      Current Outpatient Medications:    acetaminophen (TYLENOL) 500 MG tablet, Take 1,000 mg by mouth daily as needed for moderate pain. , Disp: , Rfl:    amLODipine (NORVASC) 5 MG tablet, Take 5 mg by mouth daily. , Disp: , Rfl:    benazepril-hydrochlorthiazide (LOTENSIN HCT) 20-12.5 MG tablet, Take 1 tablet by mouth daily. , Disp: , Rfl:    diazepam (VALIUM) 5 MG tablet, TAKE 1 TABLET (5 MG TOTAL) BY MOUTH EVERY 12 (TWELVE) HOURS AS NEEDED FOR ANXIETY OR SLEEP, Disp: , Rfl:    docusate sodium (COLACE) 100 MG capsule, Take 100 mg by mouth 2 (two) times daily. , Disp: , Rfl:    fenofibrate micronized (LOFIBRA) 134 MG capsule, Take 134 mg by mouth daily before breakfast. , Disp: , Rfl:  fluticasone (FLONASE) 50 MCG/ACT nasal spray, Place 2 sprays into both nostrils at bedtime., Disp: , Rfl:    hydrocortisone cream 1 %, Apply 1 application topically daily as needed for itching., Disp: , Rfl:    Multiple Vitamin (MULTIVITAMIN WITH MINERALS) TABS tablet, Take 1 tablet by mouth daily., Disp: , Rfl:    Potassium 99 MG TABS, Take 99 mg by mouth at bedtime., Disp: , Rfl:    psyllium (METAMUCIL) 58.6 % packet, Take 1 packet by mouth daily. , Disp: , Rfl:    tamsulosin (FLOMAX) 0.4 MG CAPS capsule, Take 1 capsule (0.4 mg total) by mouth daily., Disp: 90 capsule, Rfl: 3   HYDROcodone-acetaminophen (NORCO/VICODIN) 5-325 MG tablet, Take 1 tablet by mouth every 6 (six) hours as needed for severe pain. (Patient not taking: Reported on 06/08/2018), Disp: 30 tablet, Rfl: 0   Probiotic Product (PROBIOTIC-10 PO), Take by mouth every morning., Disp: , Rfl:   Physical exam:  Vitals:   06/08/18 0950  BP: 121/75  Pulse: 76  Resp: 18  Temp: 97.6 F (36.4 C)  TempSrc: Tympanic  Weight: 193 lb 12.8 oz (87.9 kg)  Height: 6' (1.829 m)   Physical  Exam Constitutional:      General: He is not in acute distress. HENT:     Head: Normocephalic and atraumatic.  Eyes:     Pupils: Pupils are equal, round, and reactive to light.  Neck:     Musculoskeletal: Normal range of motion.  Cardiovascular:     Rate and Rhythm: Normal rate and regular rhythm.     Heart sounds: Normal heart sounds.  Pulmonary:     Effort: Pulmonary effort is normal.     Breath sounds: Normal breath sounds.  Abdominal:     General: Bowel sounds are normal.     Palpations: Abdomen is soft.  Skin:    General: Skin is warm and dry.  Neurological:     Mental Status: He is alert and oriented to person, place, and time.      CMP Latest Ref Rng & Units 06/08/2018  Glucose 70 - 99 mg/dL 134(H)  BUN 8 - 23 mg/dL 18  Creatinine 0.61 - 1.24 mg/dL 1.07  Sodium 135 - 145 mmol/L 131(L)  Potassium 3.5 - 5.1 mmol/L 4.0  Chloride 98 - 111 mmol/L 97(L)  CO2 22 - 32 mmol/L 27  Calcium 8.9 - 10.3 mg/dL 8.9  Total Protein 6.5 - 8.1 g/dL 6.7  Total Bilirubin 0.3 - 1.2 mg/dL 0.5  Alkaline Phos 38 - 126 U/L 101  AST 15 - 41 U/L 25  ALT 0 - 44 U/L 16   CBC Latest Ref Rng & Units 06/08/2018  WBC 4.0 - 10.5 K/uL 10.1  Hemoglobin 13.0 - 17.0 g/dL 11.0(L)  Hematocrit 39.0 - 52.0 % 32.0(L)  Platelets 150 - 400 K/uL 243    No images are attached to the encounter.  Ct Chest W Contrast  Result Date: 06/02/2018 CLINICAL DATA:  Restaging right ureteral cancer. Ongoing chemotherapy. Evaluate response to treatment. EXAM: CT CHEST, ABDOMEN, AND PELVIS WITH CONTRAST TECHNIQUE: Multidetector CT imaging of the chest, abdomen and pelvis was performed following the standard protocol during bolus administration of intravenous contrast. CONTRAST:  17mL OMNIPAQUE IOHEXOL 300 MG/ML  SOLN COMPARISON:  03/08/2018 FINDINGS: CT CHEST FINDINGS Cardiovascular: Heart size appears within normal limits. Aortic atherosclerosis. Calcification in the LAD coronary artery noted. Mediastinum/Nodes: Normal  appearance of the thyroid gland. The trachea appears patent and is midline. Normal appearance of the  esophagus. No axillary or supraclavicular adenopathy. Lungs/Pleura: No pleural effusion. Right upper lobe pulmonary nodule measures 1.3 cm, image 76/3. Previously 1.4 cm. The posterolateral left lower lobe lung nodule measures 1 cm, image 80/3. Previously 3.3 mm. Left upper lobe lung nodule measures 3 mm, image 63/3. Previously this measured the same. Disc shaped nodular density within the anterolateral right lung base is unchanged measuring 4 mm, image 126/3. Musculoskeletal: No chest wall mass or suspicious bone lesions identified. CT ABDOMEN PELVIS FINDINGS Hepatobiliary: No focal liver abnormality is seen. No gallstones, gallbladder wall thickening, or biliary dilatation. Pancreas: Cystic lesion within the uncinate process of pancreas is again noted. The this measures 2.1 cm, image 53/4. Previously this measured 2 cm. No main duct dilatation, inflammation or enhancing mass identified. Spleen: Normal in size without focal abnormality. Adrenals/Urinary Tract: Normal appearance of the adrenal glands. Tiny cortical hypodensity within the lateral cortex of the left kidney measures 6 mm, image 71/2. Not significantly changed in the interval. Enhancing tumor within the right renal pelvis measures 2.6 by 1.6 cm, image 75/2. Previously 2.2 by 1.3 cm. The second area of enhancing tumor within the right renal collecting system measures 1.6 cm, image 80/2. Previously this measured the same. Unchanged right lower pole kidney cyst measuring 4.5 cm. Right-sided nephroureteral stent is in place. No hydronephrosis. Left posterior lateral bladder wall diverticula appears unchanged measuring 1.7 cm. Stomach/Bowel: The stomach appears normal. The small bowel loops have a normal course and caliber without obstruction. Pending storm or. Extensive colonic diverticulosis without acute inflammation. Vascular/Lymphatic: Aortic  atherosclerosis. No aneurysm. No retroperitoneal adenopathy. Right posterolateral pelvic sidewall soft tissue nodule measures 1.9 by 1.1 cm, image 115/2. Previously 1.5 x 1.1 cm. Reproductive: Prostate gland enlargement. Other: No free fluid or fluid collections. Musculoskeletal: Spondylosis identified within the lumbar spine. No aggressive lytic or sclerotic bone lesions. IMPRESSION: 1. Again seen are right upper urinary tract urothelial lesions. The larger lesions centered in the renal pelvis demonstrates mild increase in size in the interval. The other lesion is stable. 2. Right posterior pelvic sidewall lymph node is mildly increased in size in the interval. 3. The left lower lobe pulmonary nodule demonstrates interval increase in size compared with previous exam. Other nodules are stable. 4. There is a nonaggressive appearing cystic lesion within the uncinate process of pancreas which is not significantly changed compared with 08/06/2017. Favor benign cystic neoplasm of the pancreas. This has a maximum dimension of 2.1 cm. Follow-up imaging in 6 months is advised with pancreas protocol MRI or CT. This recommendation follows ACR consensus guidelines: Management of Incidental Pancreatic Cysts: A White Paper of the ACR Incidental Findings Committee. J Am Coll Radiol 9528;41:324-401. 5.  Aortic Atherosclerosis (ICD10-I70.0). Electronically Signed   By: Kerby Moors M.D.   On: 06/02/2018 14:04   Ct Abdomen Pelvis W Contrast  Result Date: 06/02/2018 CLINICAL DATA:  Restaging right ureteral cancer. Ongoing chemotherapy. Evaluate response to treatment. EXAM: CT CHEST, ABDOMEN, AND PELVIS WITH CONTRAST TECHNIQUE: Multidetector CT imaging of the chest, abdomen and pelvis was performed following the standard protocol during bolus administration of intravenous contrast. CONTRAST:  38mL OMNIPAQUE IOHEXOL 300 MG/ML  SOLN COMPARISON:  03/08/2018 FINDINGS: CT CHEST FINDINGS Cardiovascular: Heart size appears within  normal limits. Aortic atherosclerosis. Calcification in the LAD coronary artery noted. Mediastinum/Nodes: Normal appearance of the thyroid gland. The trachea appears patent and is midline. Normal appearance of the esophagus. No axillary or supraclavicular adenopathy. Lungs/Pleura: No pleural effusion. Right upper lobe pulmonary nodule measures 1.3  cm, image 76/3. Previously 1.4 cm. The posterolateral left lower lobe lung nodule measures 1 cm, image 80/3. Previously 3.3 mm. Left upper lobe lung nodule measures 3 mm, image 63/3. Previously this measured the same. Disc shaped nodular density within the anterolateral right lung base is unchanged measuring 4 mm, image 126/3. Musculoskeletal: No chest wall mass or suspicious bone lesions identified. CT ABDOMEN PELVIS FINDINGS Hepatobiliary: No focal liver abnormality is seen. No gallstones, gallbladder wall thickening, or biliary dilatation. Pancreas: Cystic lesion within the uncinate process of pancreas is again noted. The this measures 2.1 cm, image 53/4. Previously this measured 2 cm. No main duct dilatation, inflammation or enhancing mass identified. Spleen: Normal in size without focal abnormality. Adrenals/Urinary Tract: Normal appearance of the adrenal glands. Tiny cortical hypodensity within the lateral cortex of the left kidney measures 6 mm, image 71/2. Not significantly changed in the interval. Enhancing tumor within the right renal pelvis measures 2.6 by 1.6 cm, image 75/2. Previously 2.2 by 1.3 cm. The second area of enhancing tumor within the right renal collecting system measures 1.6 cm, image 80/2. Previously this measured the same. Unchanged right lower pole kidney cyst measuring 4.5 cm. Right-sided nephroureteral stent is in place. No hydronephrosis. Left posterior lateral bladder wall diverticula appears unchanged measuring 1.7 cm. Stomach/Bowel: The stomach appears normal. The small bowel loops have a normal course and caliber without obstruction.  Pending storm or. Extensive colonic diverticulosis without acute inflammation. Vascular/Lymphatic: Aortic atherosclerosis. No aneurysm. No retroperitoneal adenopathy. Right posterolateral pelvic sidewall soft tissue nodule measures 1.9 by 1.1 cm, image 115/2. Previously 1.5 x 1.1 cm. Reproductive: Prostate gland enlargement. Other: No free fluid or fluid collections. Musculoskeletal: Spondylosis identified within the lumbar spine. No aggressive lytic or sclerotic bone lesions. IMPRESSION: 1. Again seen are right upper urinary tract urothelial lesions. The larger lesions centered in the renal pelvis demonstrates mild increase in size in the interval. The other lesion is stable. 2. Right posterior pelvic sidewall lymph node is mildly increased in size in the interval. 3. The left lower lobe pulmonary nodule demonstrates interval increase in size compared with previous exam. Other nodules are stable. 4. There is a nonaggressive appearing cystic lesion within the uncinate process of pancreas which is not significantly changed compared with 08/06/2017. Favor benign cystic neoplasm of the pancreas. This has a maximum dimension of 2.1 cm. Follow-up imaging in 6 months is advised with pancreas protocol MRI or CT. This recommendation follows ACR consensus guidelines: Management of Incidental Pancreatic Cysts: A White Paper of the ACR Incidental Findings Committee. J Am Coll Radiol 3976;73:419-379. 5.  Aortic Atherosclerosis (ICD10-I70.0). Electronically Signed   By: Kerby Moors M.D.   On: 06/02/2018 14:04     Assessment and plan- Patient is a 85 y.o. male with metastatic urothelial carcinoma stage IV cT2 N0 M1 with metastases to the obturator lymph node as well as hilar adenopathy and right upper lobe lung nodule.He is here to discuss the results of CT scan and further management as well as on treatment assessment prior to cycle 1 of Keytruda  I have reviewed CT chest abdomen and pelvis images independently and  discussed findings with the patient.  Patient had previous CT scans in December 2019 he was found to have a new left lower nodule of 3 mm.  That has increased to 1 cm.  Previously there was mild increase in the size of his primary tumor in the renal pelvis which has grown further.  Also the size of the obturator  lymph node is larger.  Other lung nodules are stable to mildly increased in size.  Given that overall there has been a continuous increase in the size of his primary tumor as well as obturator node in the left lower lobe lung nodule-he does have disease progression with first-line chemotherapy.  I would therefore recommend second line Keytruda at this time.  Discussed mechanism of action of immunotherapy and its role as second line treatment and metastatic bladder cancer.  We have also previously checked FGFR mutational status and he did not have it.  Discussed risks and benefits of Keytruda including all but not limited to autoimmune colitis, pneumonitis, need to monitor thyroid kidney and liver function as well as other endocrinopathies.  Treatment will be given with a palliative intent.  Patient verbalized understanding.  We will proceed with cycle 1 of Keytruda today.  I will see him back in 3 weeks time with CBC and CMP for cycle 2 of Keytruda   Visit Diagnosis 1. Goals of care, counseling/discussion   2. Encounter for antineoplastic immunotherapy   3. 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 Randa Evens, MD, MPH [facility] [place] [phone] 06/10/2018 8:25 AM

## 2018-06-27 ENCOUNTER — Other Ambulatory Visit: Payer: Self-pay | Admitting: *Deleted

## 2018-06-27 DIAGNOSIS — C669 Malignant neoplasm of unspecified ureter: Secondary | ICD-10-CM

## 2018-06-28 ENCOUNTER — Other Ambulatory Visit: Payer: Self-pay

## 2018-06-29 ENCOUNTER — Encounter: Payer: Self-pay | Admitting: Oncology

## 2018-06-29 ENCOUNTER — Inpatient Hospital Stay: Payer: Medicare HMO | Attending: Oncology

## 2018-06-29 ENCOUNTER — Other Ambulatory Visit: Payer: Self-pay

## 2018-06-29 ENCOUNTER — Inpatient Hospital Stay (HOSPITAL_BASED_OUTPATIENT_CLINIC_OR_DEPARTMENT_OTHER): Payer: Medicare HMO | Admitting: Oncology

## 2018-06-29 VITALS — BP 124/76 | HR 81 | Temp 97.6°F | Resp 18 | Ht 72.0 in | Wt 192.5 lb

## 2018-06-29 DIAGNOSIS — C661 Malignant neoplasm of right ureter: Secondary | ICD-10-CM | POA: Insufficient documentation

## 2018-06-29 DIAGNOSIS — K8020 Calculus of gallbladder without cholecystitis without obstruction: Secondary | ICD-10-CM

## 2018-06-29 DIAGNOSIS — Z885 Allergy status to narcotic agent status: Secondary | ICD-10-CM

## 2018-06-29 DIAGNOSIS — C669 Malignant neoplasm of unspecified ureter: Secondary | ICD-10-CM

## 2018-06-29 DIAGNOSIS — Z8042 Family history of malignant neoplasm of prostate: Secondary | ICD-10-CM | POA: Diagnosis not present

## 2018-06-29 DIAGNOSIS — Z882 Allergy status to sulfonamides status: Secondary | ICD-10-CM | POA: Insufficient documentation

## 2018-06-29 DIAGNOSIS — C651 Malignant neoplasm of right renal pelvis: Secondary | ICD-10-CM | POA: Diagnosis not present

## 2018-06-29 DIAGNOSIS — Z8249 Family history of ischemic heart disease and other diseases of the circulatory system: Secondary | ICD-10-CM | POA: Insufficient documentation

## 2018-06-29 DIAGNOSIS — C676 Malignant neoplasm of ureteric orifice: Secondary | ICD-10-CM | POA: Insufficient documentation

## 2018-06-29 DIAGNOSIS — I700 Atherosclerosis of aorta: Secondary | ICD-10-CM | POA: Diagnosis not present

## 2018-06-29 DIAGNOSIS — Z5112 Encounter for antineoplastic immunotherapy: Secondary | ICD-10-CM

## 2018-06-29 DIAGNOSIS — Z7189 Other specified counseling: Secondary | ICD-10-CM

## 2018-06-29 DIAGNOSIS — C775 Secondary and unspecified malignant neoplasm of intrapelvic lymph nodes: Secondary | ICD-10-CM | POA: Diagnosis not present

## 2018-06-29 DIAGNOSIS — C7801 Secondary malignant neoplasm of right lung: Secondary | ICD-10-CM | POA: Insufficient documentation

## 2018-06-29 LAB — CBC WITH DIFFERENTIAL/PLATELET
Abs Immature Granulocytes: 0.01 10*3/uL (ref 0.00–0.07)
Basophils Absolute: 0.1 10*3/uL (ref 0.0–0.1)
Basophils Relative: 2 %
Eosinophils Absolute: 0.4 10*3/uL (ref 0.0–0.5)
Eosinophils Relative: 9 %
HCT: 33.4 % — ABNORMAL LOW (ref 39.0–52.0)
Hemoglobin: 11.4 g/dL — ABNORMAL LOW (ref 13.0–17.0)
Immature Granulocytes: 0 %
Lymphocytes Relative: 22 %
Lymphs Abs: 1 10*3/uL (ref 0.7–4.0)
MCH: 30.8 pg (ref 26.0–34.0)
MCHC: 34.1 g/dL (ref 30.0–36.0)
MCV: 90.3 fL (ref 80.0–100.0)
Monocytes Absolute: 0.6 10*3/uL (ref 0.1–1.0)
Monocytes Relative: 13 %
Neutro Abs: 2.5 10*3/uL (ref 1.7–7.7)
Neutrophils Relative %: 54 %
Platelets: 225 10*3/uL (ref 150–400)
RBC: 3.7 MIL/uL — ABNORMAL LOW (ref 4.22–5.81)
RDW: 14.2 % (ref 11.5–15.5)
WBC: 4.6 10*3/uL (ref 4.0–10.5)
nRBC: 0 % (ref 0.0–0.2)

## 2018-06-29 LAB — COMPREHENSIVE METABOLIC PANEL
ALT: 16 U/L (ref 0–44)
AST: 29 U/L (ref 15–41)
Albumin: 3.7 g/dL (ref 3.5–5.0)
Alkaline Phosphatase: 56 U/L (ref 38–126)
Anion gap: 7 (ref 5–15)
BUN: 19 mg/dL (ref 8–23)
CO2: 27 mmol/L (ref 22–32)
Calcium: 8.9 mg/dL (ref 8.9–10.3)
Chloride: 99 mmol/L (ref 98–111)
Creatinine, Ser: 1.15 mg/dL (ref 0.61–1.24)
GFR calc Af Amer: >60 mL/min (ref >60)
GFR calc non Af Amer: 58 mL/min — ABNORMAL LOW (ref >60)
Glucose, Bld: 122 mg/dL — ABNORMAL HIGH (ref 70–99)
Potassium: 3.8 mmol/L (ref 3.5–5.1)
Sodium: 133 mmol/L — ABNORMAL LOW (ref 135–145)
Total Bilirubin: 0.6 mg/dL (ref 0.3–1.2)
Total Protein: 6.7 g/dL (ref 6.5–8.1)

## 2018-06-29 MED ORDER — SODIUM CHLORIDE 0.9% FLUSH
10.0000 mL | INTRAVENOUS | Status: DC | PRN
  Administered 2018-06-29: 10 mL via INTRAVENOUS
  Filled 2018-06-29: qty 10

## 2018-06-29 MED ORDER — SODIUM CHLORIDE 0.9 % IV SOLN
Freq: Once | INTRAVENOUS | Status: AC
  Administered 2018-06-29: 10:00:00 via INTRAVENOUS
  Filled 2018-06-29: qty 250

## 2018-06-29 MED ORDER — SODIUM CHLORIDE 0.9 % IV SOLN
200.0000 mg | Freq: Once | INTRAVENOUS | Status: AC
  Administered 2018-06-29: 200 mg via INTRAVENOUS
  Filled 2018-06-29: qty 8

## 2018-06-29 MED ORDER — HEPARIN SOD (PORK) LOCK FLUSH 100 UNIT/ML IV SOLN
500.0000 [IU] | Freq: Once | INTRAVENOUS | Status: AC
  Administered 2018-06-29: 500 [IU] via INTRAVENOUS
  Filled 2018-06-29: qty 5

## 2018-06-29 NOTE — Progress Notes (Signed)
Hematology/Oncology Consult note [facility] [place]  Telephone:([phone] [phone]  Patient Care Team: Idelle Crouch, MD as PCP - General (Internal Medicine)   Name of the patient: Anthony Gonzales  [id number]  06/05/1931   Date of visit: 06/29/18  Diagnosis- Metastatic urothelial carcinoma with possible mets to the obturator node. Indeterminate hilar lymph nodes and LUL lung nodule  Chief complaint/ Reason for visit-on treatment assessment prior to cycle 2 of Keytruda  Heme/Onc history: patient is a 85 year old male with past medical history significant for hypertension and long-standing history of superficial bladder cancer for which he sees [name]. He has undergone TURBT as well as ureteroscopy since 2017 along with mitomycin as well in the past. Most recently he underwent CT abdomen on 08/06/2017 which showed a soft tissue mass in the right renal pelvis concerning for upper tract urothelial neoplasm. Soft tissue fullness at the right ureterovesical junction with proximal right hydroureteronephrosis. Several millimeter attenuation lesion in the pancreatic head.  He underwent diagnostic ureteroscopy and was found to have 2 high-grade lesions within his right kidney. There was a nodular high-grade appearing lesion in the right anterior renal pelvis. There was also a second ureteral tumor fungating from the right ureteral orifice extending into the distal ureter also consistent with high-grade invasive urothelial carcinoma. Muscle invasion could not be assessed. He also underwent a CT chest which showed a rounded nodule in the right upper lobe measuring 14 mm concerning for metastases. 2 other 3 mm lesions were also noted in the left upper lobe likely benign  Plan initially was neoadjuvant chemotherapy followed by possible surgery but given the presence of lung lesion patient has been referred to oncology for the same.  PET/CT on 09/21/17  showed:IMPRESSION: 1. Hypermetabolic right obturator lymph node is most indicative of metastatic disease. 2. Mildly hypermetabolic left hilar lymph nodes are nonspecific. Continued attention on follow-up exams is warranted. 3. Right upper lobe pulmonary nodule shows metabolism after just above blood pool and is therefore indeterminate. Continued attention on follow-up exams is warranted. 4. Aortic atherosclerosis (ICD10-170.0). Coronary artery calcification. 5. Cholelithiasis  Carboplatin/ gemzar1 week on and one-week off as patient could not tolerate 2-week on and one week off regimen. Cycle 1 started on 09/22/2017 Disease progression in March 2020.  Switched to second line Keytruda  Interval history-tolerating Keytruda well without any significant side effects.  He still reports occasional penile pain but that has improved since he had started [place].  He still has difficulty urinating on and off.  ECOG PS- 1 Pain scale- 0 Opioid associated constipation- no  Review of systems- Review of Systems  Constitutional: Positive for malaise/fatigue. Negative for chills, fever and weight loss.  HENT: Negative for congestion, ear discharge and nosebleeds.   Eyes: Negative for blurred vision.  Respiratory: Negative for cough, hemoptysis, sputum production, shortness of breath and wheezing.   Cardiovascular: Negative for chest pain, palpitations, orthopnea and claudication.  Gastrointestinal: Negative for abdominal pain, blood in stool, constipation, diarrhea, heartburn, melena, nausea and vomiting.  Genitourinary: Negative for dysuria, flank pain, frequency, hematuria and urgency.  Musculoskeletal: Negative for back pain, joint pain and myalgias.  Skin: Negative for rash.  Neurological: Negative for dizziness, tingling, focal weakness, seizures, weakness and headaches.  Endo/Heme/Allergies: Does not bruise/bleed easily.  Psychiatric/Behavioral: Negative for depression and suicidal ideas.  The patient does not have insomnia.       Allergies  Allergen Reactions   Demerol [Meperidine] Nausea And Vomiting   Lipitor [Atorvastatin]  Swelling   Sulfa Antibiotics Nausea And Vomiting and Rash     Past Medical History:  Diagnosis Date   Arthritis    Benign fibroma of prostate 08/23/2013   BPH (benign prostatic hyperplasia)    Calculus of kidney 08/23/2013   Glaucoma    no drops in 3 mo pressure good, pt denies glaucoma, eye pressure has been measuring alright.   History of kidney stones    HLD (hyperlipidemia)    HOH (hard of hearing)    Left Hearing Aid   HTN (hypertension) 12/26/2014   Hypertension    Hyponatremia 12/26/2014   Migraines    history of migraines when he was younger.   Restless leg syndrome    Sinus drainage    Skin cancer    Urothelial cancer ([place])    chemo tx's.   UTI (lower urinary tract infection) 12/26/2014   Vertigo      Past Surgical History:  Procedure Laterality Date   COLONOSCOPY     CYSTOSCOPY W/ RETROGRADES Right 01/30/2015   Procedure: CYSTOSCOPY WITH RETROGRADE PYELOGRAM;  Surgeon: [name], MD;  Location: [facility];  Service: Urology;  Laterality: Right;   CYSTOSCOPY W/ RETROGRADES Bilateral 02/26/2016   Procedure: CYSTOSCOPY WITH RETROGRADE PYELOGRAM;  Surgeon: [name], MD;  Location: [facility];  Service: Urology;  Laterality: Bilateral;   CYSTOSCOPY W/ URETERAL STENT PLACEMENT Right 08/20/2015   Procedure: CYSTOSCOPY WITH RETROGRADE PYELOGRAM/POSSIBLE URETERAL STENT PLACEMENT/BLADDER BIOPSY;  Surgeon: [name], MD;  Location: [facility];  Service: Urology;  Laterality: Right;   CYSTOSCOPY W/ URETERAL STENT PLACEMENT Right 09/12/2015   Procedure: CYSTOSCOPY WITH STENT REPLACEMENT;  Surgeon: [name], MD;  Location: [facility];  Service: Urology;  Laterality: Right;   CYSTOSCOPY WITH BIOPSY Right 09/12/2015   Procedure: CYSTOSCOPY WITH BLADDER AND URETERAL BIOPSY;  Surgeon: [name],  MD;  Location: [facility];  Service: Urology;  Laterality: Right;   CYSTOSCOPY WITH STENT PLACEMENT Right 01/30/2015   Procedure: CYSTOSCOPY WITH STENT PLACEMENT;  Surgeon: [name], MD;  Location: [facility];  Service: Urology;  Laterality: Right;   CYSTOSCOPY WITH STENT PLACEMENT Right 12/28/2017   Procedure: CYSTOSCOPY WITH STENT Exchange;  Surgeon: [name], MD;  Location: [facility];  Service: Urology;  Laterality: Right;   CYSTOSCOPY/URETEROSCOPY/HOLMIUM LASER/STENT PLACEMENT Right 08/19/2017   Procedure: CYSTOSCOPY/URETEROSCOPY/HOLMIUM LASER/STENT PLACEMENT;  Surgeon: [name], MD;  Location: [facility];  Service: Urology;  Laterality: Right;   EYE SURGERY Bilateral    Cataract Extraction with IOL   goiter removal     HOLMIUM LASER APPLICATION N/A 5/36/6440   Procedure:  HOLMIUM LASER APPLICATION;  Surgeon: [name], MD;  Location: [facility];  Service: Urology;  Laterality: N/A;   PORTA CATH INSERTION N/A 09/23/2017   Procedure: PORTA CATH INSERTION;  Surgeon: Algernon Huxley, MD;  Location: [facility];  Service: Cardiovascular;  Laterality: N/A;   SPERMATOCELECTOMY     TONSILLECTOMY     TRANSURETHRAL RESECTION OF BLADDER TUMOR N/A 02/26/2016   Procedure: TRANSURETHRAL RESECTION OF BLADDER TUMOR (TURBT);  Surgeon: [name], MD;  Location: [facility];  Service: Urology;  Laterality: N/A;   TRANSURETHRAL RESECTION OF BLADDER TUMOR WITH MITOMYCIN-C N/A 09/12/2015   Procedure: TRANSURETHRAL RESECTION OF BLADDER TUMOR ;  Surgeon: [name], MD;  Location: [facility];  Service: Urology;  Laterality: N/A;   TRANSURETHRAL RESECTION OF BLADDER TUMOR WITH MITOMYCIN-C N/A 03/24/2016   Procedure: TRANSURETHRAL RESECTION OF BLADDER TUMOR WITH MITOMYCIN-C  (SMALL);  Surgeon: [name], MD;  Location:  [facility];  Service: Urology;  Laterality: N/A;   URETERAL BIOPSY Right 08/19/2017   Procedure: Renal Mass BIOPSY;  Surgeon: [name], MD;  Location: [facility];  Service: Urology;  Laterality: Right;   URETEROSCOPY Right 01/30/2015   Procedure: URETEROSCOPY/ WITH BIOPSY AND CYTOLOGY BRUSHING;  Surgeon: [name], MD;  Location: [facility];  Service: Urology;  Laterality: Right;   URETEROSCOPY Right 08/20/2015   Procedure: URETEROSCOPY;  Surgeon: [name], MD;  Location: [facility];  Service: Urology;  Laterality: Right;   URETEROSCOPY Right 09/12/2015   Procedure: URETEROSCOPY;  Surgeon: [name], MD;  Location: [facility];  Service: Urology;  Laterality: Right;   URETEROSCOPY Right 02/26/2016   Procedure: URETEROSCOPY;  Surgeon: [name], MD;  Location: [facility];  Service: Urology;  Laterality: Right;    Social History   Socioeconomic History   Marital status: Married    Spouse name: Not on file   Number of children: Not on file   Years of education: Not on file   Highest education level: Not on file  Occupational History   Not on file  Social Needs   Financial resource strain: Not on file   Food insecurity:    Worry: Not on file    Inability: Not on file   Transportation needs:    Medical: Not on file    Non-medical: Not on file  Tobacco Use   Smoking status: Never Smoker   Smokeless tobacco: Never Used  Substance and Sexual Activity   Alcohol use: No    Alcohol/week: 0.0 standard drinks   Drug use: No   Sexual activity: Not on file  Lifestyle   Physical activity:    Days per week: Not on file    Minutes per session: Not on file   Stress: Not on file  Relationships   Social connections:    Talks on phone: Not on file    Gets together: Not on file    Attends religious service: Not on file    Active member of club or organization: Not on file    Attends meetings of clubs or organizations: Not on file    Relationship status: Not on file   Intimate partner violence:    Fear of current or ex partner: Not on file    Emotionally abused: Not on file    Physically abused: Not on file     Forced sexual activity: Not on file  Other Topics Concern   Not on file  Social History Narrative   Not on file    Family History  Problem Relation Age of Onset   Hypertension Mother    Hypertension Father    Prostate cancer Brother    Kidney disease Neg Hx    Kidney cancer Neg Hx    Bladder Cancer Neg Hx      Current Outpatient Medications:    amLODipine (NORVASC) 5 MG tablet, Take 5 mg by mouth daily. , Disp: , Rfl:    benazepril-hydrochlorthiazide (LOTENSIN HCT) 20-12.5 MG tablet, Take 1 tablet by mouth daily. , Disp: , Rfl:    docusate sodium (COLACE) 100 MG capsule, Take 100 mg by mouth 2 (two) times daily. , Disp: , Rfl:    fenofibrate micronized (LOFIBRA) 134 MG capsule, Take 134 mg by mouth daily before breakfast. , Disp: , Rfl:    fluticasone (FLONASE) 50 MCG/ACT nasal spray, Place 2 sprays into both nostrils at bedtime., Disp: , Rfl:    Multiple Vitamin (MULTIVITAMIN WITH MINERALS)  TABS tablet, Take 1 tablet by mouth daily., Disp: , Rfl:    Potassium 99 MG TABS, Take 99 mg by mouth at bedtime., Disp: , Rfl:    Probiotic Product (PROBIOTIC-10 PO), Take by mouth every morning., Disp: , Rfl:    psyllium (METAMUCIL) 58.6 % packet, Take 1 packet by mouth daily. , Disp: , Rfl:    tamsulosin (FLOMAX) 0.4 MG CAPS capsule, Take 1 capsule (0.4 mg total) by mouth daily., Disp: 90 capsule, Rfl: 3   acetaminophen (TYLENOL) 500 MG tablet, Take 1,000 mg by mouth daily as needed for moderate pain. , Disp: , Rfl:    diazepam (VALIUM) 5 MG tablet, TAKE 1 TABLET (5 MG TOTAL) BY MOUTH EVERY 12 (TWELVE) HOURS AS NEEDED FOR ANXIETY OR SLEEP, Disp: , Rfl:    HYDROcodone-acetaminophen (NORCO/VICODIN) 5-325 MG tablet, Take 1 tablet by mouth every 6 (six) hours as needed for severe pain. (Patient not taking: Reported on 06/08/2018), Disp: 30 tablet, Rfl: 0   hydrocortisone cream 1 %, Apply 1 application topically daily as needed for itching., Disp: , Rfl:  No current  facility-administered medications for this visit.   Facility-Administered Medications Ordered in Other Visits:    sodium chloride flush (NS) 0.9 % injection 10 mL, 10 mL, Intravenous, PRN, Sindy Guadeloupe, MD, 10 mL at 06/29/18 0854  Physical exam:  Vitals:   06/29/18 0915  BP: 124/76  Pulse: 81  Resp: 18  Temp: 97.6 F (36.4 C)  TempSrc: Tympanic  Weight: 192 lb 8 oz (87.3 kg)  Height: 6' (1.829 m)   Physical Exam Constitutional:      General: He is not in acute distress. HENT:     Head: Normocephalic and atraumatic.  Eyes:     Pupils: Pupils are equal, round, and reactive to light.  Neck:     Musculoskeletal: Normal range of motion.  Cardiovascular:     Rate and Rhythm: Normal rate and regular rhythm.     Heart sounds: Normal heart sounds.  Pulmonary:     Effort: Pulmonary effort is normal.     Breath sounds: Normal breath sounds.  Abdominal:     General: Bowel sounds are normal.     Palpations: Abdomen is soft.  Skin:    General: Skin is warm and dry.  Neurological:     Mental Status: He is alert and oriented to person, place, and time.      CMP Latest Ref Rng & Units 06/29/2018  Glucose 70 - 99 mg/dL 122(H)  BUN 8 - 23 mg/dL 19  Creatinine 0.61 - 1.24 mg/dL 1.15  Sodium 135 - 145 mmol/L 133(L)  Potassium 3.5 - 5.1 mmol/L 3.8  Chloride 98 - 111 mmol/L 99  CO2 22 - 32 mmol/L 27  Calcium 8.9 - 10.3 mg/dL 8.9  Total Protein 6.5 - 8.1 g/dL 6.7  Total Bilirubin 0.3 - 1.2 mg/dL 0.6  Alkaline Phos 38 - 126 U/L 56  AST 15 - 41 U/L 29  ALT 0 - 44 U/L 16   CBC Latest Ref Rng & Units 06/29/2018  WBC 4.0 - 10.5 K/uL 4.6  Hemoglobin 13.0 - 17.0 g/dL 11.4(L)  Hematocrit 39.0 - 52.0 % 33.4(L)  Platelets 150 - 400 K/uL 225    No images are attached to the encounter.  Ct Chest W Contrast  Result Date: 06/02/2018 CLINICAL DATA:  Restaging right ureteral cancer. Ongoing chemotherapy. Evaluate response to treatment. EXAM: CT CHEST, ABDOMEN, AND PELVIS WITH CONTRAST  TECHNIQUE: Multidetector CT imaging of  the chest, abdomen and pelvis was performed following the standard protocol during bolus administration of intravenous contrast. CONTRAST:  19mL OMNIPAQUE IOHEXOL 300 MG/ML  SOLN COMPARISON:  03/08/2018 FINDINGS: CT CHEST FINDINGS Cardiovascular: Heart size appears within normal limits. Aortic atherosclerosis. Calcification in the LAD coronary artery noted. Mediastinum/Nodes: Normal appearance of the thyroid gland. The trachea appears patent and is midline. Normal appearance of the esophagus. No axillary or supraclavicular adenopathy. Lungs/Pleura: No pleural effusion. Right upper lobe pulmonary nodule measures 1.3 cm, image 76/3. Previously 1.4 cm. The posterolateral left lower lobe lung nodule measures 1 cm, image 80/3. Previously 3.3 mm. Left upper lobe lung nodule measures 3 mm, image 63/3. Previously this measured the same. Disc shaped nodular density within the anterolateral right lung base is unchanged measuring 4 mm, image 126/3. Musculoskeletal: No chest wall mass or suspicious bone lesions identified. CT ABDOMEN PELVIS FINDINGS Hepatobiliary: No focal liver abnormality is seen. No gallstones, gallbladder wall thickening, or biliary dilatation. Pancreas: Cystic lesion within the uncinate process of pancreas is again noted. The this measures 2.1 cm, image 53/4. Previously this measured 2 cm. No main duct dilatation, inflammation or enhancing mass identified. Spleen: Normal in size without focal abnormality. Adrenals/Urinary Tract: Normal appearance of the adrenal glands. Tiny cortical hypodensity within the lateral cortex of the left kidney measures 6 mm, image 71/2. Not significantly changed in the interval. Enhancing tumor within the right renal pelvis measures 2.6 by 1.6 cm, image 75/2. Previously 2.2 by 1.3 cm. The second area of enhancing tumor within the right renal collecting system measures 1.6 cm, image 80/2. Previously this measured the same. Unchanged right  lower pole kidney cyst measuring 4.5 cm. Right-sided nephroureteral stent is in place. No hydronephrosis. Left posterior lateral bladder wall diverticula appears unchanged measuring 1.7 cm. Stomach/Bowel: The stomach appears normal. The small bowel loops have a normal course and caliber without obstruction. Pending storm or. Extensive colonic diverticulosis without acute inflammation. Vascular/Lymphatic: Aortic atherosclerosis. No aneurysm. No retroperitoneal adenopathy. Right posterolateral pelvic sidewall soft tissue nodule measures 1.9 by 1.1 cm, image 115/2. Previously 1.5 x 1.1 cm. Reproductive: Prostate gland enlargement. Other: No free fluid or fluid collections. Musculoskeletal: Spondylosis identified within the lumbar spine. No aggressive lytic or sclerotic bone lesions. IMPRESSION: 1. Again seen are right upper urinary tract urothelial lesions. The larger lesions centered in the renal pelvis demonstrates mild increase in size in the interval. The other lesion is stable. 2. Right posterior pelvic sidewall lymph node is mildly increased in size in the interval. 3. The left lower lobe pulmonary nodule demonstrates interval increase in size compared with previous exam. Other nodules are stable. 4. There is a nonaggressive appearing cystic lesion within the uncinate process of pancreas which is not significantly changed compared with 08/06/2017. Favor benign cystic neoplasm of the pancreas. This has a maximum dimension of 2.1 cm. Follow-up imaging in 6 months is advised with pancreas protocol MRI or CT. This recommendation follows ACR consensus guidelines: Management of Incidental Pancreatic Cysts: A White Paper of the ACR Incidental Findings Committee. J Am Coll Radiol 2440;10:272-536. 5.  Aortic Atherosclerosis (ICD10-I70.0). Electronically Signed   By: Kerby Moors M.D.   On: 06/02/2018 14:04   Ct Abdomen Pelvis W Contrast  Result Date: 06/02/2018 CLINICAL DATA:  Restaging right ureteral cancer.  Ongoing chemotherapy. Evaluate response to treatment. EXAM: CT CHEST, ABDOMEN, AND PELVIS WITH CONTRAST TECHNIQUE: Multidetector CT imaging of the chest, abdomen and pelvis was performed following the standard protocol during bolus administration of intravenous contrast.  CONTRAST:  49mL OMNIPAQUE IOHEXOL 300 MG/ML  SOLN COMPARISON:  03/08/2018 FINDINGS: CT CHEST FINDINGS Cardiovascular: Heart size appears within normal limits. Aortic atherosclerosis. Calcification in the LAD coronary artery noted. Mediastinum/Nodes: Normal appearance of the thyroid gland. The trachea appears patent and is midline. Normal appearance of the esophagus. No axillary or supraclavicular adenopathy. Lungs/Pleura: No pleural effusion. Right upper lobe pulmonary nodule measures 1.3 cm, image 76/3. Previously 1.4 cm. The posterolateral left lower lobe lung nodule measures 1 cm, image 80/3. Previously 3.3 mm. Left upper lobe lung nodule measures 3 mm, image 63/3. Previously this measured the same. Disc shaped nodular density within the anterolateral right lung base is unchanged measuring 4 mm, image 126/3. Musculoskeletal: No chest wall mass or suspicious bone lesions identified. CT ABDOMEN PELVIS FINDINGS Hepatobiliary: No focal liver abnormality is seen. No gallstones, gallbladder wall thickening, or biliary dilatation. Pancreas: Cystic lesion within the uncinate process of pancreas is again noted. The this measures 2.1 cm, image 53/4. Previously this measured 2 cm. No main duct dilatation, inflammation or enhancing mass identified. Spleen: Normal in size without focal abnormality. Adrenals/Urinary Tract: Normal appearance of the adrenal glands. Tiny cortical hypodensity within the lateral cortex of the left kidney measures 6 mm, image 71/2. Not significantly changed in the interval. Enhancing tumor within the right renal pelvis measures 2.6 by 1.6 cm, image 75/2. Previously 2.2 by 1.3 cm. The second area of enhancing tumor within the right  renal collecting system measures 1.6 cm, image 80/2. Previously this measured the same. Unchanged right lower pole kidney cyst measuring 4.5 cm. Right-sided nephroureteral stent is in place. No hydronephrosis. Left posterior lateral bladder wall diverticula appears unchanged measuring 1.7 cm. Stomach/Bowel: The stomach appears normal. The small bowel loops have a normal course and caliber without obstruction. Pending storm or. Extensive colonic diverticulosis without acute inflammation. Vascular/Lymphatic: Aortic atherosclerosis. No aneurysm. No retroperitoneal adenopathy. Right posterolateral pelvic sidewall soft tissue nodule measures 1.9 by 1.1 cm, image 115/2. Previously 1.5 x 1.1 cm. Reproductive: Prostate gland enlargement. Other: No free fluid or fluid collections. Musculoskeletal: Spondylosis identified within the lumbar spine. No aggressive lytic or sclerotic bone lesions. IMPRESSION: 1. Again seen are right upper urinary tract urothelial lesions. The larger lesions centered in the renal pelvis demonstrates mild increase in size in the interval. The other lesion is stable. 2. Right posterior pelvic sidewall lymph node is mildly increased in size in the interval. 3. The left lower lobe pulmonary nodule demonstrates interval increase in size compared with previous exam. Other nodules are stable. 4. There is a nonaggressive appearing cystic lesion within the uncinate process of pancreas which is not significantly changed compared with 08/06/2017. Favor benign cystic neoplasm of the pancreas. This has a maximum dimension of 2.1 cm. Follow-up imaging in 6 months is advised with pancreas protocol MRI or CT. This recommendation follows ACR consensus guidelines: Management of Incidental Pancreatic Cysts: A White Paper of the ACR Incidental Findings Committee. J Am Coll Radiol 2355;73:220-254. 5.  Aortic Atherosclerosis (ICD10-I70.0). Electronically Signed   By: Kerby Moors M.D.   On: 06/02/2018 14:04      Assessment and plan- Patient is a 85 y.o. male with metastatic urothelial carcinoma stage IV cT2 N0 M1 with metastases to the obturator lymph node as well as hilar adenopathy and right upper lobe lung nodule.He is here for on treatment assessment prior to cycle 2 of Keytruda  Counts okay to proceed with cycle 2 of Keytruda today.  I will see him back in 3  weeks time with CBC with differential, CMP for cycle 3.  Plans to repeat scans after 4 cycles.  Discussed overall prognosis and stage IV urothelial cancer is poor and 18 to 24 months from the time of diagnosis at best.  Encouraged patient to think about his end-of-life goals including preference for CPR and mechanical intubation.  He would like to think about it and get back to me at his next visit.   Visit Diagnosis 1. 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 Encounter for antineoplastic immunotherapy   3. Goals of care, counseling/discussion      Dr. Randa Evens, MD, MPH [facility] [phone] 06/29/2018 12:39 PM

## 2018-07-19 ENCOUNTER — Other Ambulatory Visit: Payer: Self-pay

## 2018-07-20 ENCOUNTER — Other Ambulatory Visit: Payer: Self-pay

## 2018-07-20 ENCOUNTER — Inpatient Hospital Stay: Payer: Medicare HMO | Attending: Oncology | Admitting: *Deleted

## 2018-07-20 ENCOUNTER — Inpatient Hospital Stay: Payer: Medicare HMO

## 2018-07-20 ENCOUNTER — Encounter: Payer: Self-pay | Admitting: Oncology

## 2018-07-20 ENCOUNTER — Inpatient Hospital Stay (HOSPITAL_BASED_OUTPATIENT_CLINIC_OR_DEPARTMENT_OTHER): Payer: Medicare HMO | Admitting: Oncology

## 2018-07-20 VITALS — BP 121/67 | HR 76 | Temp 97.4°F | Resp 18 | Wt 191.5 lb

## 2018-07-20 DIAGNOSIS — C669 Malignant neoplasm of unspecified ureter: Secondary | ICD-10-CM

## 2018-07-20 DIAGNOSIS — R39198 Other difficulties with micturition: Secondary | ICD-10-CM | POA: Diagnosis not present

## 2018-07-20 DIAGNOSIS — C7801 Secondary malignant neoplasm of right lung: Secondary | ICD-10-CM | POA: Diagnosis not present

## 2018-07-20 DIAGNOSIS — C661 Malignant neoplasm of right ureter: Secondary | ICD-10-CM

## 2018-07-20 DIAGNOSIS — Z5112 Encounter for antineoplastic immunotherapy: Secondary | ICD-10-CM | POA: Insufficient documentation

## 2018-07-20 DIAGNOSIS — Z95828 Presence of other vascular implants and grafts: Secondary | ICD-10-CM

## 2018-07-20 DIAGNOSIS — N4889 Other specified disorders of penis: Secondary | ICD-10-CM | POA: Diagnosis not present

## 2018-07-20 LAB — COMPREHENSIVE METABOLIC PANEL
ALT: 18 U/L (ref 0–44)
AST: 30 U/L (ref 15–41)
Albumin: 3.8 g/dL (ref 3.5–5.0)
Alkaline Phosphatase: 54 U/L (ref 38–126)
Anion gap: 7 (ref 5–15)
BUN: 20 mg/dL (ref 8–23)
CO2: 28 mmol/L (ref 22–32)
Calcium: 8.9 mg/dL (ref 8.9–10.3)
Chloride: 98 mmol/L (ref 98–111)
Creatinine, Ser: 1.13 mg/dL (ref 0.61–1.24)
GFR calc Af Amer: >60 mL/min (ref >60)
GFR calc non Af Amer: 59 mL/min — ABNORMAL LOW (ref >60)
Glucose, Bld: 122 mg/dL — ABNORMAL HIGH (ref 70–99)
Potassium: 4 mmol/L (ref 3.5–5.1)
Sodium: 133 mmol/L — ABNORMAL LOW (ref 135–145)
Total Bilirubin: 0.6 mg/dL (ref 0.3–1.2)
Total Protein: 6.8 g/dL (ref 6.5–8.1)

## 2018-07-20 LAB — CBC WITH DIFFERENTIAL/PLATELET
Abs Immature Granulocytes: 0.01 10*3/uL (ref 0.00–0.07)
Basophils Absolute: 0 10*3/uL (ref 0.0–0.1)
Basophils Relative: 1 %
Eosinophils Absolute: 0.2 10*3/uL (ref 0.0–0.5)
Eosinophils Relative: 4 %
HCT: 35.5 % — ABNORMAL LOW (ref 39.0–52.0)
Hemoglobin: 12 g/dL — ABNORMAL LOW (ref 13.0–17.0)
Immature Granulocytes: 0 %
Lymphocytes Relative: 23 %
Lymphs Abs: 1 10*3/uL (ref 0.7–4.0)
MCH: 30.4 pg (ref 26.0–34.0)
MCHC: 33.8 g/dL (ref 30.0–36.0)
MCV: 89.9 fL (ref 80.0–100.0)
Monocytes Absolute: 0.5 10*3/uL (ref 0.1–1.0)
Monocytes Relative: 12 %
Neutro Abs: 2.7 10*3/uL (ref 1.7–7.7)
Neutrophils Relative %: 60 %
Platelets: 200 10*3/uL (ref 150–400)
RBC: 3.95 MIL/uL — ABNORMAL LOW (ref 4.22–5.81)
RDW: 12.8 % (ref 11.5–15.5)
WBC: 4.4 10*3/uL (ref 4.0–10.5)
nRBC: 0 % (ref 0.0–0.2)

## 2018-07-20 MED ORDER — SODIUM CHLORIDE 0.9 % IV SOLN
200.0000 mg | Freq: Once | INTRAVENOUS | Status: AC
  Administered 2018-07-20: 200 mg via INTRAVENOUS
  Filled 2018-07-20: qty 8

## 2018-07-20 MED ORDER — HEPARIN SOD (PORK) LOCK FLUSH 100 UNIT/ML IV SOLN
500.0000 [IU] | Freq: Once | INTRAVENOUS | Status: AC | PRN
  Administered 2018-07-20: 500 [IU]

## 2018-07-20 MED ORDER — SODIUM CHLORIDE 0.9 % IV SOLN
Freq: Once | INTRAVENOUS | Status: AC
  Administered 2018-07-20: 12:00:00 via INTRAVENOUS
  Filled 2018-07-20: qty 250

## 2018-07-20 MED ORDER — SODIUM CHLORIDE 0.9% FLUSH
10.0000 mL | Freq: Once | INTRAVENOUS | Status: AC
  Administered 2018-07-20: 11:00:00 10 mL via INTRAVENOUS
  Filled 2018-07-20: qty 10

## 2018-07-20 NOTE — Progress Notes (Signed)
Hematology/Oncology Consult note [facility]  Telephone:([phone] [phone]  Patient Care Team: Idelle Crouch, MD as PCP - General (Internal Medicine)   Name of the patient: Anthony Gonzales  [id number]  02-03-1932   Date of visit: 07/20/18  Diagnosis- Metastatic urothelial carcinoma with possible mets to the obturator node. Indeterminate hilar lymph nodes and LUL lung nodule  Chief complaint/ Reason for visit-on treatment assessment prior to cycle 3 of Keytruda  Heme/Onc history: patient is a 85 year old male with past medical history significant for hypertension and long-standing history of superficial bladder cancer for which he sees [name]. He has undergone TURBT as well as ureteroscopy since 2017 along with mitomycin as well in the past. Most recently he underwent CT abdomen on 08/06/2017 which showed a soft tissue mass in the right renal pelvis concerning for upper tract urothelial neoplasm. Soft tissue fullness at the right ureterovesical junction with proximal right hydroureteronephrosis. Several millimeter attenuation lesion in the pancreatic head.  He underwent diagnostic ureteroscopy and was found to have 2 high-grade lesions within his right kidney. There was a nodular high-grade appearing lesion in the right anterior renal pelvis. There was also a second ureteral tumor fungating from the right ureteral orifice extending into the distal ureter also consistent with high-grade invasive urothelial carcinoma. Muscle invasion could not be assessed. He also underwent a CT chest which showed a rounded nodule in the right upper lobe measuring 14 mm concerning for metastases. 2 other 3 mm lesions were also noted in the left upper lobe likely benign  Plan initially was neoadjuvant chemotherapy followed by possible surgery but given the presence of lung lesion patient has been referred to oncology for the same.  PET/CT on 09/21/17  showed:IMPRESSION: 1. Hypermetabolic right obturator lymph node is most indicative of metastatic disease. 2. Mildly hypermetabolic left hilar lymph nodes are nonspecific. Continued attention on follow-up exams is warranted. 3. Right upper lobe pulmonary nodule shows metabolism after just above blood pool and is therefore indeterminate. Continued attention on follow-up exams is warranted. 4. Aortic atherosclerosis (ICD10-170.0). Coronary artery calcification. 5. Cholelithiasis  Carboplatin/ gemzar1 week on and one-week off as patient could not tolerate 2-week on and one week off regimen. Cycle 1 started on 09/22/2017 Disease progression in March 2020.  Switched to second [employer]   Interval history-he continues to have intermittent penile pain which is self-limited as well as difficulty urinating.  His urinary stream is normal on most occasions but there are times when he has to sit and wait before we can start passing urine.  ECOG PS- 1 Pain scale- 0 Opioid associated constipation- no  Review of systems- Review of Systems  Constitutional: Positive for malaise/fatigue. Negative for chills, fever and weight loss.  HENT: Negative for congestion, ear discharge and nosebleeds.   Eyes: Negative for blurred vision.  Respiratory: Negative for cough, hemoptysis, sputum production, shortness of breath and wheezing.   Cardiovascular: Negative for chest pain, palpitations, orthopnea and claudication.  Gastrointestinal: Negative for abdominal pain, blood in stool, constipation, diarrhea, heartburn, melena, nausea and vomiting.  Genitourinary: Negative for dysuria, flank pain, frequency, hematuria and urgency.       Penile pain  Musculoskeletal: Negative for back pain, joint pain and myalgias.  Skin: Negative for rash.  Neurological: Negative for dizziness, tingling, focal weakness, seizures, weakness and headaches.  Endo/Heme/Allergies: Does not bruise/bleed easily.   Psychiatric/Behavioral: Negative for depression and suicidal ideas. The patient does not have insomnia.  Allergies  Allergen Reactions  . Demerol [Meperidine] Nausea And Vomiting  . Lipitor [Atorvastatin] Swelling  . Sulfa Antibiotics Nausea And Vomiting and Rash     Past Medical History:  Diagnosis Date  . Arthritis   . Benign fibroma of prostate 08/23/2013  . BPH (benign prostatic hyperplasia)   . Calculus of kidney 08/23/2013  . Glaucoma    no drops in 3 mo pressure good, pt denies glaucoma, eye pressure has been measuring alright.  . History of kidney stones   . HLD (hyperlipidemia)   . HOH (hard of hearing)    Left Hearing Aid  . HTN (hypertension) 12/26/2014  . Hypertension   . Hyponatremia 12/26/2014  . Migraines    history of migraines when he was younger.  [name] Restless leg syndrome   . Sinus drainage   . Skin cancer   . Urothelial cancer ([place])    chemo tx's.  [name] UTI (lower urinary tract infection) 12/26/2014  . Vertigo      Past Surgical History:  Procedure Laterality Date  . COLONOSCOPY    . CYSTOSCOPY W/ RETROGRADES Right 01/30/2015   Procedure: CYSTOSCOPY WITH RETROGRADE PYELOGRAM;  Surgeon: [name], MD;  Location: [facility];  Service: Urology;  Laterality: Right;  . CYSTOSCOPY W/ RETROGRADES Bilateral 02/26/2016   Procedure: CYSTOSCOPY WITH RETROGRADE PYELOGRAM;  Surgeon: [name], MD;  Location: [facility];  Service: Urology;  Laterality: Bilateral;  . CYSTOSCOPY W/ URETERAL STENT PLACEMENT Right 08/20/2015   Procedure: CYSTOSCOPY WITH RETROGRADE PYELOGRAM/POSSIBLE URETERAL STENT PLACEMENT/BLADDER BIOPSY;  Surgeon: [name], MD;  Location: [facility];  Service: Urology;  Laterality: Right;  . CYSTOSCOPY W/ URETERAL STENT PLACEMENT Right 09/12/2015   Procedure: CYSTOSCOPY WITH STENT REPLACEMENT;  Surgeon: [name], MD;  Location: [facility];  Service: Urology;  Laterality: Right;  . CYSTOSCOPY WITH BIOPSY Right 09/12/2015   Procedure:  CYSTOSCOPY WITH BLADDER AND URETERAL BIOPSY;  Surgeon: [name], MD;  Location: [facility];  Service: Urology;  Laterality: Right;  . CYSTOSCOPY WITH STENT PLACEMENT Right 01/30/2015   Procedure: CYSTOSCOPY WITH STENT PLACEMENT;  Surgeon: [name], MD;  Location: [facility];  Service: Urology;  Laterality: Right;  . CYSTOSCOPY WITH STENT PLACEMENT Right 12/28/2017   Procedure: [place] WITH STENT Exchange;  Surgeon: [name], MD;  Location: [facility];  Service: Urology;  Laterality: Right;  . CYSTOSCOPY/URETEROSCOPY/HOLMIUM LASER/STENT PLACEMENT Right 08/19/2017   Procedure: CYSTOSCOPY/URETEROSCOPY/HOLMIUM LASER/STENT PLACEMENT;  Surgeon: [name], MD;  Location: [facility];  Service: Urology;  Laterality: Right;  . EYE SURGERY Bilateral    Cataract Extraction with IOL  . goiter removal    . HOLMIUM LASER APPLICATION N/A 9/51/8841   Procedure:  HOLMIUM LASER APPLICATION;  Surgeon: [name], MD;  Location: [facility];  Service: Urology;  Laterality: N/A;  . PORTA CATH INSERTION N/A 09/23/2017   Procedure: PORTA CATH INSERTION;  Surgeon: Algernon Huxley, MD;  Location: [facility];  Service: Cardiovascular;  Laterality: N/A;  . SPERMATOCELECTOMY    . TONSILLECTOMY    . TRANSURETHRAL RESECTION OF BLADDER TUMOR N/A 02/26/2016   Procedure: TRANSURETHRAL RESECTION OF BLADDER TUMOR (TURBT);  Surgeon: [name], MD;  Location: [facility];  Service: Urology;  Laterality: N/A;  . TRANSURETHRAL RESECTION OF BLADDER TUMOR WITH MITOMYCIN-C N/A 09/12/2015   Procedure: TRANSURETHRAL RESECTION OF BLADDER TUMOR ;  Surgeon: [name], MD;  Location: [facility];  Service: Urology;  Laterality: N/A;  . TRANSURETHRAL RESECTION OF BLADDER TUMOR WITH MITOMYCIN-C N/A 03/24/2016   Procedure: TRANSURETHRAL  RESECTION OF BLADDER TUMOR WITH MITOMYCIN-C  (SMALL);  Surgeon: [name], MD;  Location: [facility];  Service: Urology;  Laterality: N/A;  . URETERAL BIOPSY Right 08/19/2017    Procedure: Renal Mass BIOPSY;  Surgeon: [name], MD;  Location: [facility];  Service: Urology;  Laterality: Right;  . URETEROSCOPY Right 01/30/2015   Procedure: URETEROSCOPY/ WITH BIOPSY AND CYTOLOGY BRUSHING;  Surgeon: [name], MD;  Location: [facility];  Service: Urology;  Laterality: Right;  . URETEROSCOPY Right 08/20/2015   Procedure: URETEROSCOPY;  Surgeon: [name], MD;  Location: [facility];  Service: Urology;  Laterality: Right;  . URETEROSCOPY Right 09/12/2015   Procedure: URETEROSCOPY;  Surgeon: [name], MD;  Location: [facility];  Service: Urology;  Laterality: Right;  . URETEROSCOPY Right 02/26/2016   Procedure: URETEROSCOPY;  Surgeon: [name], MD;  Location: [facility];  Service: Urology;  Laterality: Right;    Social History   Socioeconomic History  . Marital status: Married    Spouse name: Not on file  . Number of children: Not on file  . Years of education: Not on file  . Highest education level: Not on file  Occupational History  . Not on file  Social Needs  . Financial resource strain: Not on file  . Food insecurity:    Worry: Not on file    Inability: Not on file  . Transportation needs:    Medical: Not on file    Non-medical: Not on file  Tobacco Use  . Smoking status: Never Smoker  . Smokeless tobacco: Never Used  Substance and Sexual Activity  . Alcohol use: No    Alcohol/week: 0.0 standard drinks  . Drug use: No  . Sexual activity: Not on file  Lifestyle  . Physical activity:    Days per week: Not on file    Minutes per session: Not on file  . Stress: Not on file  Relationships  . Social connections:    Talks on phone: Not on file    Gets together: Not on file    Attends religious service: Not on file    Active member of club or organization: Not on file    Attends meetings of clubs or organizations: Not on file    Relationship status: Not on file  . Intimate partner violence:    Fear of current or ex partner: Not on file     Emotionally abused: Not on file    Physically abused: Not on file    Forced sexual activity: Not on file  Other Topics Concern  . Not on file  Social History Narrative  . Not on file    Family History  Problem Relation Age of Onset  . Hypertension Mother   . Hypertension Father   . Prostate cancer Brother   . Kidney disease Neg Hx   . Kidney cancer Neg Hx   . Bladder Cancer Neg Hx      Current Outpatient Medications:  .  acetaminophen (TYLENOL) 500 MG tablet, Take 1,000 mg by mouth daily as needed for moderate pain. , Disp: , Rfl:  .  amLODipine (NORVASC) 5 MG tablet, Take 5 mg by mouth daily. , Disp: , Rfl:  .  benazepril-hydrochlorthiazide (LOTENSIN HCT) 20-12.5 MG tablet, Take 1 tablet by mouth daily. , Disp: , Rfl:  .  diazepam (VALIUM) 5 MG tablet, TAKE 1 TABLET (5 MG TOTAL) BY MOUTH EVERY 12 (TWELVE) HOURS AS NEEDED FOR ANXIETY OR SLEEP, Disp: , Rfl:  .  docusate  sodium (COLACE) 100 MG capsule, Take 100 mg by mouth 2 (two) times daily. , Disp: , Rfl:  .  fenofibrate micronized (LOFIBRA) 134 MG capsule, Take 134 mg by mouth daily before breakfast. , Disp: , Rfl:  .  fluticasone (FLONASE) 50 MCG/ACT nasal spray, Place 2 sprays into both nostrils at bedtime., Disp: , Rfl:  .  HYDROcodone-acetaminophen (NORCO/VICODIN) 5-325 MG tablet, Take 1 tablet by mouth every 6 (six) hours as needed for severe pain., Disp: 30 tablet, Rfl: 0 .  hydrocortisone cream 1 %, Apply 1 application topically daily as needed for itching., Disp: , Rfl:  .  Multiple Vitamin (MULTIVITAMIN WITH MINERALS) TABS tablet, Take 1 tablet by mouth daily., Disp: , Rfl:  .  Potassium 99 MG TABS, Take 99 mg by mouth at bedtime., Disp: , Rfl:  .  Probiotic Product (PROBIOTIC-10 PO), Take by mouth every morning., Disp: , Rfl:  .  psyllium (METAMUCIL) 58.6 % packet, Take 1 packet by mouth daily. , Disp: , Rfl:  .  tamsulosin (FLOMAX) 0.4 MG CAPS capsule, Take 1 capsule (0.4 mg total) by mouth daily., Disp: 90 capsule,  Rfl: 3 No current facility-administered medications for this visit.   Facility-Administered Medications Ordered in Other Visits:  .  [COMPLETED] heparin lock flush 100 unit/mL, 500 Units, Intracatheter, Once PRN, Sindy Guadeloupe, MD, 500 Units at 07/20/18 1300  Physical exam:  Vitals:   07/20/18 1101  BP: 121/67  Pulse: 76  Resp: 18  Temp: (!) 97.4 F (36.3 C)  TempSrc: Tympanic  Weight: 191 lb 8 oz (86.9 kg)   Physical Exam Constitutional:      General: He is not in acute distress. HENT:     Head: Normocephalic and atraumatic.  Eyes:     Pupils: Pupils are equal, round, and reactive to light.  Neck:     Musculoskeletal: Normal range of motion.  Cardiovascular:     Rate and Rhythm: Normal rate and regular rhythm.     Heart sounds: Normal heart sounds.  Pulmonary:     Effort: Pulmonary effort is normal.     Breath sounds: Normal breath sounds.  Abdominal:     General: Bowel sounds are normal.     Palpations: Abdomen is soft.  Skin:    General: Skin is warm and dry.  Neurological:     Mental Status: He is alert and oriented to person, place, and time.      CMP Latest Ref Rng & Units 07/20/2018  Glucose 70 - 99 mg/dL 122(H)  BUN 8 - 23 mg/dL 20  Creatinine 0.61 - 1.24 mg/dL 1.13  Sodium 135 - 145 mmol/L 133(L)  Potassium 3.5 - 5.1 mmol/L 4.0  Chloride 98 - 111 mmol/L 98  CO2 22 - 32 mmol/L 28  Calcium 8.9 - 10.3 mg/dL 8.9  Total Protein 6.5 - 8.1 g/dL 6.8  Total Bilirubin 0.3 - 1.2 mg/dL 0.6  Alkaline Phos 38 - 126 U/L 54  AST 15 - 41 U/L 30  ALT 0 - 44 U/L 18   CBC Latest Ref Rng & Units 07/20/2018  WBC 4.0 - 10.5 K/uL 4.4  Hemoglobin 13.0 - 17.0 g/dL 12.0(L)  Hematocrit 39.0 - 52.0 % 35.5(L)  Platelets 150 - 400 K/uL 200      Assessment and plan- Patient is a 85 y.o. 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  He  is here for on  treatment assessment prior to cycle 3 of Keytruda  Counts okay to proceed with cycle 3 of Keytruda today.  I will see him back in 3 weeks time with CBC and CMP prior to cycle #4.  Repeat CT chest abdomen pelvis after 4 cycles.   Visit Diagnosis 1. Encounter for antineoplastic immunotherapy   2. 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 Randa Evens, MD, MPH [facility] [phone] 07/20/2018 12:54 PM

## 2018-07-20 NOTE — Progress Notes (Signed)
Patient here for follow up. Complains of pain to penis especially during the day.

## 2018-08-03 ENCOUNTER — Telehealth (INDEPENDENT_AMBULATORY_CARE_PROVIDER_SITE_OTHER): Payer: Medicare HMO | Admitting: Urology

## 2018-08-03 ENCOUNTER — Other Ambulatory Visit: Payer: Self-pay

## 2018-08-03 DIAGNOSIS — N4889 Other specified disorders of penis: Secondary | ICD-10-CM | POA: Diagnosis not present

## 2018-08-03 DIAGNOSIS — N1330 Unspecified hydronephrosis: Secondary | ICD-10-CM

## 2018-08-03 DIAGNOSIS — C651 Malignant neoplasm of right renal pelvis: Secondary | ICD-10-CM

## 2018-08-03 NOTE — Progress Notes (Signed)
Virtual Visit via Telephone Note  I connected with Anthony Gonzales on 08/03/18 at 11:00 AM EDT by telephone and verified that I am speaking with the correct person using two identifiers.  Location: Patient: home Provider: home   I discussed the limitations, risks, security and privacy concerns of performing an evaluation and management service by telephone and the availability of in person appointments. I also discussed with the patient that there may be a patient responsible charge related to this service. The patient expressed understanding and agreed to proceed.   History of Present Illness: 85 year old male with metastatic urothelial carcinoma involving primarily the right renal pelvis, possibly the right distal ureter, pelvic adenopathy and progression of pulmonary nodules currently managed on Keytruda who returns today to discuss management of the stent via telephone visit.  Please see previous notes for details.  Since her last visit, he continues to have penile pain, presumably referred pain from the stent.  This is worse when he is ambulatory.  He reports that over the past week given the significant amount of rain, he was stuck inside of his house and had very little penile pain.  We is out walking around and being active, the pain is significant.  Myrbetriq and other anticholinergics have not been helpful.  This is impacting his ability to do things that he enjoys.  Since last visit, he did have disease progression on carboplatinum/Gemzar thus was switched to second line Keytruda which is well-tolerated.  He not had any interval imaging since 05/2018.  He believes that he is due for another scan in the near future but is yet to be scheduled.  He is managed by Dr. Janese Banks.  Notably, he does have a chronic right indwelling ureteral stent due to obstructive disease.  This was last exchanged on 12/2017 with minimal to no encrustation.  He is due for consideration of stent exchange.   CT  abdomen pelvis from 05/2018 was personally reviewed today.  Agree with radiologic interpretation.   Observations/Objective: Calm, interactive answers questions appropriately  Assessment and Plan:  1. 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 Janese Banks  2. Penile pain Presumably secondary to referred stent pain  Previous evaluation with KUB cystoscopy indicates stent is in good position and functioning well  We had a lengthy discussion today about his overall quality of life.  He has had progression of his disease with a primary goal of palliation  In light of this, we had a lengthy discussion about the option of simply removing his stent.  This may result in right ureteral obstruction or recurrence of his right hydronephrosis from obstructing tumor which will likely impact the functionality of the right kidney over time.  He understands this.  We also discussed alternative options to stent including expectant management as above, consideration of replacement of a stent if he starts experience renal compromise versus infection, or consideration of placement of a percutaneous nephrostomy tube at that time if deemed appropriate.  He seemed to understand this conversation and would in fact like to go ahead and have his stent removed.  We will follow-up very closely after stent removal with a renal ultrasound/BMP a week thereafter.  At that point, will consider replacing the stent as deemed necessary or follow expectantly.  He understands the risks and benefits of this.  3. Hydronephrosis of right kidney As above   Follow Up Instructions: Cystoscopy/ stent  removal next week   I discussed the assessment and treatment plan with the patient. The patient was provided an opportunity to ask questions and all were answered. The patient agreed with the plan and demonstrated an  understanding of the instructions.   The patient was advised to call back or seek an in-person evaluation if the symptoms worsen or if the condition fails to improve as anticipated.  I provided 12 minutes of non-face-to-face time during this encounter.   [name], MD

## 2018-08-10 ENCOUNTER — Other Ambulatory Visit: Payer: Self-pay | Admitting: *Deleted

## 2018-08-10 ENCOUNTER — Telehealth: Payer: Self-pay | Admitting: *Deleted

## 2018-08-10 DIAGNOSIS — C669 Malignant neoplasm of unspecified ureter: Secondary | ICD-10-CM

## 2018-08-10 NOTE — Telephone Encounter (Signed)
Patient called reporting that he has been waiting for a call from Dr Janese Banks for 3 weeks and he would like a return call to let him know "what is going on". [phone]. He has no follow up appts scheduled

## 2018-08-10 NOTE — Telephone Encounter (Signed)
I called patient and let him know that his appt. Was never made. I apologized for it. I wanted to see if he can do a web visit tom. Afternoon at 3 pm because I know he is having cystoscopy tom. Mid am.  Then I put in lab encounter for 8:45 6/4 for labs and then the infusion. He is agreeable to this.

## 2018-08-11 ENCOUNTER — Encounter: Payer: Self-pay | Admitting: Oncology

## 2018-08-11 ENCOUNTER — Other Ambulatory Visit: Payer: Self-pay

## 2018-08-11 ENCOUNTER — Ambulatory Visit (INDEPENDENT_AMBULATORY_CARE_PROVIDER_SITE_OTHER): Payer: Medicare HMO | Admitting: Urology

## 2018-08-11 ENCOUNTER — Encounter: Payer: Self-pay | Admitting: Urology

## 2018-08-11 ENCOUNTER — Inpatient Hospital Stay: Payer: Medicare HMO | Attending: Oncology | Admitting: Oncology

## 2018-08-11 VITALS — BP 162/82 | HR 83 | Ht 72.0 in | Wt 192.0 lb

## 2018-08-11 DIAGNOSIS — Z79899 Other long term (current) drug therapy: Secondary | ICD-10-CM | POA: Insufficient documentation

## 2018-08-11 DIAGNOSIS — Z7951 Long term (current) use of inhaled steroids: Secondary | ICD-10-CM | POA: Insufficient documentation

## 2018-08-11 DIAGNOSIS — Z85828 Personal history of other malignant neoplasm of skin: Secondary | ICD-10-CM | POA: Insufficient documentation

## 2018-08-11 DIAGNOSIS — C661 Malignant neoplasm of right ureter: Secondary | ICD-10-CM

## 2018-08-11 DIAGNOSIS — N4889 Other specified disorders of penis: Secondary | ICD-10-CM

## 2018-08-11 DIAGNOSIS — Z515 Encounter for palliative care: Secondary | ICD-10-CM | POA: Insufficient documentation

## 2018-08-11 DIAGNOSIS — Z5112 Encounter for antineoplastic immunotherapy: Secondary | ICD-10-CM | POA: Insufficient documentation

## 2018-08-11 DIAGNOSIS — Z452 Encounter for adjustment and management of vascular access device: Secondary | ICD-10-CM | POA: Insufficient documentation

## 2018-08-11 DIAGNOSIS — I10 Essential (primary) hypertension: Secondary | ICD-10-CM | POA: Insufficient documentation

## 2018-08-11 DIAGNOSIS — Z8249 Family history of ischemic heart disease and other diseases of the circulatory system: Secondary | ICD-10-CM | POA: Insufficient documentation

## 2018-08-11 DIAGNOSIS — C7801 Secondary malignant neoplasm of right lung: Secondary | ICD-10-CM | POA: Insufficient documentation

## 2018-08-11 DIAGNOSIS — N1330 Unspecified hydronephrosis: Secondary | ICD-10-CM | POA: Diagnosis not present

## 2018-08-11 LAB — URINALYSIS, COMPLETE
Bilirubin, UA: NEGATIVE
Glucose, UA: NEGATIVE
Ketones, UA: NEGATIVE
Nitrite, UA: NEGATIVE
Specific Gravity, UA: 1.015 (ref 1.005–1.030)
Urobilinogen, Ur: 0.2 mg/dL (ref 0.2–1.0)
pH, UA: 8.5 — ABNORMAL HIGH (ref 5.0–7.5)

## 2018-08-11 LAB — MICROSCOPIC EXAMINATION
Bacteria, UA: NONE SEEN
RBC, Urine: >30 /hpf — AB (ref 0–2)

## 2018-08-11 MED ORDER — CIPROFLOXACIN HCL 500 MG PO TABS
500.0000 mg | ORAL_TABLET | Freq: Once | ORAL | Status: AC
  Administered 2018-08-11: 500 mg via ORAL

## 2018-08-11 NOTE — Progress Notes (Signed)
   08/11/18  CC:  Chief Complaint  Patient presents with  . Cysto Stent Removal    HPI: 85 yo M with right upper tract urothelia carcinoma with obstructing tumor management with chronic indwelling stent with chronic difficult to control penile pain who presents today cystoscopy/ stent removal.    See previous virtual notes for details.    Blood pressure (!) 162/82, pulse 83, height 6' (1.829 m), weight 192 lb (87.1 kg). NED. A&Ox3.   No respiratory distress   Abd soft, NT, ND Normal phallus with bilateral descended testicles  Cystoscopy/ Stent removal procedure  Patient identification was confirmed, informed consent was obtained, and patient was prepped using Betadine solution.  Lidocaine jelly was administered per urethral meatus.    Preoperative abx where received prior to procedure.    Procedure: - Flexible cystoscope introduced, without any difficulty.   - Thorough search of the bladder revealed:    normal urethral meatus  Stent seen emanating from right ureteral orifice, grasped with stent graspers, and removed in entirety.    Post-Procedure: - Patient tolerated the procedure well   Assessment/ Plan:   1. Hydronephrosis of right kidney Status post stent removal today We will follow closely with follow-up renal ultrasound in 4 weeks, of hydronephrosis is moderate or severe her compromised renal function, will consider placement of the stent Stent removed primarily today for palliation of penile pain, see previous note for details Warning symptoms reviewed in detail - Urinalysis, Complete - US RENAL; Future - ciprofloxacin (CIPRO) tablet 500 mg  2. Penile pain Secondary to stent as above  3. Ureteral cancer, right [facility]) Managed by cancer center   Return in about 4 weeks (around 09/08/2018) for virtual visit with RUS prior.  [name], MD

## 2018-08-11 NOTE — Progress Notes (Unsigned)
Pt had stent taking out and no more penis pain as of today.

## 2018-08-12 ENCOUNTER — Other Ambulatory Visit: Payer: Self-pay | Admitting: Oncology

## 2018-08-12 ENCOUNTER — Inpatient Hospital Stay (HOSPITAL_BASED_OUTPATIENT_CLINIC_OR_DEPARTMENT_OTHER): Payer: Medicare HMO | Admitting: Nurse Practitioner

## 2018-08-12 ENCOUNTER — Other Ambulatory Visit: Payer: Self-pay

## 2018-08-12 ENCOUNTER — Inpatient Hospital Stay: Payer: Medicare HMO

## 2018-08-12 DIAGNOSIS — Z452 Encounter for adjustment and management of vascular access device: Secondary | ICD-10-CM | POA: Diagnosis present

## 2018-08-12 DIAGNOSIS — Z7951 Long term (current) use of inhaled steroids: Secondary | ICD-10-CM | POA: Diagnosis not present

## 2018-08-12 DIAGNOSIS — Z95828 Presence of other vascular implants and grafts: Secondary | ICD-10-CM

## 2018-08-12 DIAGNOSIS — Z515 Encounter for palliative care: Secondary | ICD-10-CM

## 2018-08-12 DIAGNOSIS — Z5112 Encounter for antineoplastic immunotherapy: Secondary | ICD-10-CM | POA: Diagnosis not present

## 2018-08-12 DIAGNOSIS — C7801 Secondary malignant neoplasm of right lung: Secondary | ICD-10-CM | POA: Diagnosis not present

## 2018-08-12 DIAGNOSIS — Z79899 Other long term (current) drug therapy: Secondary | ICD-10-CM | POA: Diagnosis not present

## 2018-08-12 DIAGNOSIS — I10 Essential (primary) hypertension: Secondary | ICD-10-CM | POA: Diagnosis not present

## 2018-08-12 DIAGNOSIS — Z8249 Family history of ischemic heart disease and other diseases of the circulatory system: Secondary | ICD-10-CM | POA: Diagnosis not present

## 2018-08-12 DIAGNOSIS — C661 Malignant neoplasm of right ureter: Secondary | ICD-10-CM | POA: Diagnosis not present

## 2018-08-12 DIAGNOSIS — Z85828 Personal history of other malignant neoplasm of skin: Secondary | ICD-10-CM | POA: Diagnosis not present

## 2018-08-12 DIAGNOSIS — C669 Malignant neoplasm of unspecified ureter: Secondary | ICD-10-CM

## 2018-08-12 LAB — CBC WITH DIFFERENTIAL/PLATELET
Abs Immature Granulocytes: 0.01 10*3/uL (ref 0.00–0.07)
Basophils Absolute: 0 10*3/uL (ref 0.0–0.1)
Basophils Relative: 1 %
Eosinophils Absolute: 0.1 10*3/uL (ref 0.0–0.5)
Eosinophils Relative: 3 %
HCT: 36.5 % — ABNORMAL LOW (ref 39.0–52.0)
Hemoglobin: 12.5 g/dL — ABNORMAL LOW (ref 13.0–17.0)
Immature Granulocytes: 0 %
Lymphocytes Relative: 30 %
Lymphs Abs: 1.1 10*3/uL (ref 0.7–4.0)
MCH: 30.1 pg (ref 26.0–34.0)
MCHC: 34.2 g/dL (ref 30.0–36.0)
MCV: 88 fL (ref 80.0–100.0)
Monocytes Absolute: 0.4 10*3/uL (ref 0.1–1.0)
Monocytes Relative: 11 %
Neutro Abs: 1.9 10*3/uL (ref 1.7–7.7)
Neutrophils Relative %: 55 %
Platelets: 204 10*3/uL (ref 150–400)
RBC: 4.15 MIL/uL — ABNORMAL LOW (ref 4.22–5.81)
RDW: 12.6 % (ref 11.5–15.5)
WBC: 3.6 10*3/uL — ABNORMAL LOW (ref 4.0–10.5)
nRBC: 0 % (ref 0.0–0.2)

## 2018-08-12 LAB — COMPREHENSIVE METABOLIC PANEL
ALT: 21 U/L (ref 0–44)
AST: 32 U/L (ref 15–41)
Albumin: 3.7 g/dL (ref 3.5–5.0)
Alkaline Phosphatase: 45 U/L (ref 38–126)
Anion gap: 7 (ref 5–15)
BUN: 18 mg/dL (ref 8–23)
CO2: 29 mmol/L (ref 22–32)
Calcium: 9 mg/dL (ref 8.9–10.3)
Chloride: 98 mmol/L (ref 98–111)
Creatinine, Ser: 1.19 mg/dL (ref 0.61–1.24)
GFR calc Af Amer: >60 mL/min (ref >60)
GFR calc non Af Amer: 55 mL/min — ABNORMAL LOW (ref >60)
Glucose, Bld: 122 mg/dL — ABNORMAL HIGH (ref 70–99)
Potassium: 3.8 mmol/L (ref 3.5–5.1)
Sodium: 134 mmol/L — ABNORMAL LOW (ref 135–145)
Total Bilirubin: 0.7 mg/dL (ref 0.3–1.2)
Total Protein: 6.6 g/dL (ref 6.5–8.1)

## 2018-08-12 LAB — TSH: TSH: 1.089 u[IU]/mL (ref 0.350–4.500)

## 2018-08-12 MED ORDER — SODIUM CHLORIDE 0.9% FLUSH
10.0000 mL | Freq: Once | INTRAVENOUS | Status: AC
  Administered 2018-08-12: 10 mL via INTRAVENOUS
  Filled 2018-08-12: qty 10

## 2018-08-12 NOTE — Progress Notes (Signed)
[place]  [phone] [phone]   Name: Anthony Gonzales Date: 08/12/2018 MRN: [id number]  DOB: [birth year]  Patient Care Team: Idelle Crouch, MD as PCP - General (Internal Medicine)    REASON FOR CONSULTATION: Palliative Care consult requested for this 85 y.o. male with multiple medical problems including metastatic urothelial carcinoma with possible mets to the obturator node, indeterminate hilar lymph nodes, and left upper lobe nodule.  Patient is currently on treatment with second line Keytruda after not being able to tolerate carbo-gemcitabine.  He is status post TURBT.  He has had severe penile pain which has significantly improved since stent removal with [name] on 08/11/2018.  He was referred to palliative care to help address goals.  SOCIAL HISTORY:     reports that he has never smoked. He has never used smokeless tobacco. He reports that he does not drink alcohol or use drugs.  Patient is married and lives at home with his wife of 58 years.  He has no children.  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   History of kidney stones    HLD (hyperlipidemia)    HOH (hard of hearing)    Left Hearing Aid   HTN (hypertension) 12/26/2014   Hypertension    Hyponatremia 12/26/2014   Migraines    history of migraines when he was younger.   Restless leg syndrome    Sinus drainage    Skin cancer    Urothelial cancer ([place])    chemo tx's.   UTI (lower urinary tract infection) 12/26/2014   Vertigo     PAST SURGICAL HISTORY:    Past Surgical History:  Procedure Laterality Date   COLONOSCOPY     CYSTOSCOPY W/ RETROGRADES Right 01/30/2015   Procedure: CYSTOSCOPY WITH RETROGRADE PYELOGRAM;  Surgeon: [name], MD;  Location: [facility];  Service: Urology;  Laterality: Right;   CYSTOSCOPY W/ RETROGRADES Bilateral 02/26/2016   Procedure: CYSTOSCOPY WITH RETROGRADE PYELOGRAM;  Surgeon: [name], MD;  Location: [facility];  Service: Urology;  Laterality: Bilateral;   CYSTOSCOPY W/ URETERAL STENT PLACEMENT Right 08/20/2015   Procedure: CYSTOSCOPY WITH RETROGRADE PYELOGRAM/POSSIBLE URETERAL STENT PLACEMENT/BLADDER BIOPSY;  Surgeon: [name], MD;  Location: [facility];  Service: Urology;  Laterality: Right;   CYSTOSCOPY W/ URETERAL STENT PLACEMENT Right 09/12/2015   Procedure: CYSTOSCOPY WITH STENT REPLACEMENT;  Surgeon: [name], MD;  Location: [facility];  Service: Urology;  Laterality: Right;   CYSTOSCOPY WITH BIOPSY Right 09/12/2015   Procedure: CYSTOSCOPY WITH BLADDER AND URETERAL BIOPSY;  Surgeon: [name], MD;  Location: [facility];  Service: Urology;  Laterality: Right;   CYSTOSCOPY WITH STENT PLACEMENT Right 01/30/2015   Procedure: CYSTOSCOPY WITH STENT PLACEMENT;  Surgeon: [name], MD;  Location: [facility];  Service: Urology;  Laterality: Right;   CYSTOSCOPY WITH STENT PLACEMENT Right 12/28/2017   Procedure: CYSTOSCOPY WITH STENT Exchange;  Surgeon: [name], MD;  Location: [facility];  Service: Urology;  Laterality: Right;   CYSTOSCOPY/URETEROSCOPY/HOLMIUM LASER/STENT PLACEMENT Right 08/19/2017   Procedure: CYSTOSCOPY/URETEROSCOPY/HOLMIUM LASER/STENT PLACEMENT;  Surgeon:  [name], MD;  Location: [facility];  Service: Urology;  Laterality: Right;   EYE SURGERY Bilateral    Cataract Extraction with IOL   goiter removal     HOLMIUM LASER APPLICATION N/A 1/66/0630   Procedure:  HOLMIUM LASER APPLICATION;  Surgeon: [name], MD;  Location: [facility];  Service: Urology;   Laterality: N/A;   PORTA CATH INSERTION N/A 09/23/2017   Procedure: PORTA CATH INSERTION;  Surgeon: Algernon Huxley, MD;  Location: [facility];  Service: Cardiovascular;  Laterality: N/A;   SPERMATOCELECTOMY     TONSILLECTOMY     TRANSURETHRAL RESECTION OF BLADDER TUMOR N/A 02/26/2016   Procedure: TRANSURETHRAL RESECTION OF BLADDER TUMOR (TURBT);  Surgeon: [name], MD;  Location: [facility];  Service: Urology;  Laterality: N/A;   TRANSURETHRAL RESECTION OF BLADDER TUMOR WITH MITOMYCIN-C N/A 09/12/2015   Procedure: TRANSURETHRAL RESECTION OF BLADDER TUMOR ;  Surgeon: [name], MD;  Location: [facility];  Service: Urology;  Laterality: N/A;   TRANSURETHRAL RESECTION OF BLADDER TUMOR WITH MITOMYCIN-C N/A 03/24/2016   Procedure: TRANSURETHRAL RESECTION OF BLADDER TUMOR WITH MITOMYCIN-C  (SMALL);  Surgeon: [name], MD;  Location: [facility];  Service: Urology;  Laterality: N/A;   URETERAL BIOPSY Right 08/19/2017   Procedure: Renal Mass BIOPSY;  Surgeon: [name], MD;  Location: [facility];  Service: Urology;  Laterality: Right;   URETEROSCOPY Right 01/30/2015   Procedure: URETEROSCOPY/ WITH BIOPSY AND CYTOLOGY BRUSHING;  Surgeon: [name], MD;  Location: [facility];  Service: Urology;  Laterality: Right;   URETEROSCOPY Right 08/20/2015   Procedure: URETEROSCOPY;  Surgeon: [name], MD;  Location: [facility];  Service: Urology;  Laterality: Right;   URETEROSCOPY Right 09/12/2015   Procedure: URETEROSCOPY;  Surgeon: [name], MD;  Location: [facility];  Service: Urology;  Laterality: Right;   URETEROSCOPY Right 02/26/2016   Procedure: URETEROSCOPY;  Surgeon: [name], MD;  Location: [facility];  Service: Urology;  Laterality: Right;    HEMATOLOGY/ONCOLOGY HISTORY:    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[place])   09/15/2017 Cancer Staging    Staging form: Renal Pelvis and Ureter, AJCC 8th Edition - Clinical stage from 09/15/2017: Stage IV (cT2, cN0, cM1) -  Signed by Sindy Guadeloupe, MD on 09/17/2017    09/17/2017 Initial Diagnosis    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[place])    09/22/2017 - 06/07/2018 Chemotherapy    The patient had palonosetron (ALOXI) injection 0.25 mg, 0.25 mg, Intravenous,  Once, 9 of 10 cycles Administration: 0.25 mg (09/22/2017), 0.25 mg (10/06/2017), 0.25 mg (10/20/2017), 0.25 mg (11/03/2017), 0.25 mg (11/17/2017), 0.25 mg (12/01/2017), 0.25 mg (12/15/2017), 0.25 mg (12/29/2017), 0.25 mg (01/12/2018), 0.25 mg (01/26/2018), 0.25 mg (02/09/2018), 0.25 mg (02/23/2018), 0.25 mg (03/16/2018), 0.25 mg (04/13/2018), 0.25 mg (05/11/2018) pegfilgrastim-cbqv (UDENYCA) injection 6 mg, 6 mg, Subcutaneous, Once, 5 of 6 cycles Administration: 6 mg (01/13/2018), 6 mg (02/10/2018), 6 mg (03/17/2018), 6 mg (04/15/2018), 6 mg (05/12/2018), 6 mg (05/26/2018) CARBOplatin (PARAPLATIN) 160 mg in sodium chloride 0.9 % 250 mL chemo infusion, 160 mg (100 % of original dose 163.2 mg), Intravenous,  Once, 9 of 10 cycles Dose modification:   (original dose 163.2 mg, Cycle 1) Administration: 160 mg (09/22/2017), 170 mg (10/06/2017), 170 mg (10/20/2017), 170 mg (11/17/2017), 160 mg (12/01/2017), 170 mg (11/03/2017), 160 mg (12/15/2017), 160 mg (12/29/2017), 180 mg (01/12/2018), 180 mg (01/26/2018), 160 mg (02/09/2018), 160 mg (02/23/2018), 160 mg (03/16/2018), 160 mg (03/30/2018), 160 mg (04/13/2018), 180 mg (04/27/2018), 180 mg (05/11/2018), 160 mg (  05/25/2018) gemcitabine (GEMZAR) 1,600 mg in sodium chloride 0.9 % 250 mL chemo infusion, 1,672 mg (100 % of original dose 800 mg/m2), Intravenous,  Once, 9 of 10 cycles Dose modification: 800 mg/m2 (original dose 800 mg/m2, Cycle 1, Reason: Patient Age) Administration: 1,600 mg (09/22/2017), 1,600 mg (10/06/2017), 1,600 mg (10/20/2017), 1,600 mg (11/03/2017), 1,600 mg (11/17/2017), 1,600 mg (12/01/2017), 1,600 mg (12/15/2017), 1,600 mg (12/29/2017), 1,600 mg (01/12/2018), 1,600 mg (01/26/2018), 1,600 mg (02/09/2018), 1,600 mg (02/23/2018), 1,600 mg (03/16/2018),  1,600 mg (03/30/2018), 1,600 mg (04/13/2018), 1,600 mg (04/27/2018), 1,600 mg (05/11/2018), 1,600 mg (05/25/2018)  for chemotherapy treatment.     06/08/2018 -  Chemotherapy    The patient had pembrolizumab (KEYTRUDA) 200 mg in sodium chloride 0.9 % 50 mL chemo infusion, 200 mg, Intravenous, Once, 3 of 5 cycles Administration: 200 mg (06/08/2018), 200 mg (06/29/2018), 200 mg (07/20/2018)  for chemotherapy treatment.      ALLERGIES:  is allergic to demerol [meperidine]; lipitor [atorvastatin]; and sulfa antibiotics.  MEDICATIONS:  Current Outpatient Medications  Medication Sig Dispense Refill   acetaminophen (TYLENOL) 500 MG tablet Take 1,000 mg by mouth daily as needed for moderate pain.      amLODipine (NORVASC) 5 MG tablet Take 5 mg by mouth daily.      benazepril-hydrochlorthiazide (LOTENSIN HCT) 20-12.5 MG tablet Take 1 tablet by mouth daily.      diazepam (VALIUM) 5 MG tablet TAKE 1 TABLET (5 MG TOTAL) BY MOUTH EVERY 12 (TWELVE) HOURS AS NEEDED FOR ANXIETY OR SLEEP     docusate sodium (COLACE) 100 MG capsule Take 100 mg by mouth 2 (two) times daily.      fenofibrate micronized (LOFIBRA) 134 MG capsule Take 134 mg by mouth daily before breakfast.      fluticasone (FLONASE) 50 MCG/ACT nasal spray Place 2 sprays into both nostrils at bedtime.     HYDROcodone-acetaminophen (NORCO/VICODIN) 5-325 MG tablet Take 1 tablet by mouth every 6 (six) hours as needed for severe pain. 30 tablet 0   hydrocortisone cream 1 % Apply 1 application topically daily as needed for itching.     Multiple Vitamin (MULTIVITAMIN WITH MINERALS) TABS tablet Take 1 tablet by mouth daily.     Potassium 99 MG TABS Take 99 mg by mouth at bedtime.     Probiotic Product (PROBIOTIC-10 PO) Take by mouth every morning.     psyllium (METAMUCIL) 58.6 % packet Take 1 packet by mouth daily.      tamsulosin (FLOMAX) 0.4 MG CAPS capsule Take 1 capsule (0.4 mg total) by mouth daily. 90 capsule 3   No current  facility-administered medications for this visit.     VITAL SIGNS: There were no vitals taken for this visit. There were no vitals filed for this visit.  Estimated body mass index is 26.04 kg/m as calculated from the following:   Height as of 08/11/18: 6' (1.829 m).   Weight as of 08/11/18: 192 lb (87.1 kg).  LABS: CBC:    Component Value Date/Time   WBC 3.6 (L) 08/12/2018 0843   HGB 12.5 (L) 08/12/2018 0843   HGB 14.1 09/04/2017 0949   HCT 36.5 (L) 08/12/2018 0843   HCT 40.5 09/04/2017 0949   PLT 204 08/12/2018 0843   PLT 282 09/04/2017 0949   MCV 88.0 08/12/2018 0843   MCV 86 09/04/2017 0949   MCV 88 03/23/2013 0418   NEUTROABS 1.9 08/12/2018 0843   NEUTROABS 4.6 09/04/2017 0949   NEUTROABS 2.8 03/23/2013 0418   LYMPHSABS 1.1 08/12/2018 4034  LYMPHSABS 1.9 09/04/2017 0949   LYMPHSABS 1.2 03/23/2013 0418   MONOABS 0.4 08/12/2018 0843   MONOABS 0.8 03/23/2013 0418   EOSABS 0.1 08/12/2018 0843   EOSABS 0.3 09/04/2017 0949   EOSABS 0.1 03/23/2013 0418   BASOSABS 0.0 08/12/2018 0843   BASOSABS 0.0 09/04/2017 0949   BASOSABS 0.0 03/23/2013 0418   Comprehensive Metabolic Panel:    Component Value Date/Time   NA 134 (L) 08/12/2018 0843   NA 140 03/23/2013 0418   K 3.8 08/12/2018 0843   K 3.8 03/23/2013 0418   CL 98 08/12/2018 0843   CL 111 (H) 03/23/2013 0418   CO2 29 08/12/2018 0843   CO2 24 03/23/2013 0418   BUN 18 08/12/2018 0843   BUN 24 (H) 03/23/2013 0418   CREATININE 1.19 08/12/2018 0843   CREATININE 1.79 (H) 03/23/2013 0418   GLUCOSE 122 (H) 08/12/2018 0843   GLUCOSE 98 03/23/2013 0418   CALCIUM 9.0 08/12/2018 0843   CALCIUM 8.0 (L) 03/23/2013 0418   AST 32 08/12/2018 0843   AST 40 (H) 03/22/2013 0451   ALT 21 08/12/2018 0843   ALT 28 03/22/2013 0451   ALKPHOS 45 08/12/2018 0843   ALKPHOS 32 (L) 03/22/2013 0451   BILITOT 0.7 08/12/2018 0843   BILITOT 0.3 03/22/2013 0451   PROT 6.6 08/12/2018 0843   PROT 5.5 (L) 03/22/2013 0451   ALBUMIN 3.7  08/12/2018 0843   ALBUMIN 2.7 (L) 03/22/2013 0451    RADIOGRAPHIC STUDIES: No results found.  PERFORMANCE STATUS (ECOG) : 1 - Symptomatic but completely ambulatory  Review of Systems  Constitutional: Negative for activity change, appetite change, fatigue and unexpected weight change.  HENT: Negative for mouth sores and trouble swallowing.   Eyes: Negative for pain and visual disturbance.  Respiratory: Negative for cough and shortness of breath.   Cardiovascular: Negative for chest pain and leg swelling.  Gastrointestinal: Negative for abdominal distention, abdominal pain, constipation, diarrhea and nausea.  Endocrine: Negative for cold intolerance and polyuria.  Genitourinary: Negative for decreased urine volume and difficulty urinating.  Musculoskeletal: Negative for arthralgias, gait problem and myalgias.  Skin: Negative for rash and wound.  Neurological: Negative for dizziness, weakness and light-headedness.  Psychiatric/Behavioral: Negative for sleep disturbance. The patient is not nervous/anxious.   Unless otherwise noted, a complete review of systems is negative.  Physical Exam:  General: NAD, frail appearing, thin Cardiovascular: regular rate and rhythm Pulmonary: clear ant fields Abdomen: soft, nontender, + bowel sounds GU: no suprapubic tenderness Extremities: no edema, no joint deformities Skin: no rashes Neurological: Weakness but otherwise nonfocal  IMPRESSION: I met with patient today in clinic.  I introduced palliative care services and attempted to establish a therapeutic rapport.  He is currently receiving second line Keytruda with Dr. Janese Banks.  He reports that since his ureteral stent was removed his penile pain has dramatically improved and he has minimal to no pain.  He says that pain was severely debilitating and negatively impact his quality of life significantly.  He understands that he may need a stent again in the future says he would be reluctant to do so.   He denies urinary symptoms.  Some hesitancy though he feels it is improved since stent was removed.  Denies pain.  Says appetite is good.  Weight is stable.  He is functionally independent and lives at home with his wife.  He says that he is somewhat more fatigued than prior to his diagnosis but jokes that he is 62.  He recognizes that  his cancer will worsen over time and anticipates that cancer would be the cause of his death.  He says that he has thought a lot about end-of-life particularly when his pain was severe.  He says that today though he feels well.  He has not created advanced directives and we briefly discussed advanced care planning and I provided him with documents to review at home.  He says he likes the idea of a peaceful death at home but will discuss in more detail with his wife.   PLAN: - f/u with palliative care in 2 weeks & review ACP & most at that time  Patient expressed understanding and was in agreement with this plan. He also understands that He can call the clinic at any time with any questions, concerns, or complaints.   Time Total: 35 minutes  Visit consisted of counseling and education dealing with the complex and emotionally intense issues of symptom management and palliative care in the setting of serious and potentially life-threatening illness.Greater than 50%  of this time was spent counseling and coordinating care related to the above assessment and plan.  Signed by: Beckey Rutter, DNP, AGNP-C [facility] [place] (work cell) [phone] (office)  CC: Dr. Janese Banks

## 2018-08-13 ENCOUNTER — Inpatient Hospital Stay: Payer: Medicare HMO

## 2018-08-13 ENCOUNTER — Other Ambulatory Visit: Payer: Self-pay

## 2018-08-13 VITALS — BP 126/74 | HR 73 | Temp 97.0°F | Resp 18

## 2018-08-13 DIAGNOSIS — C669 Malignant neoplasm of unspecified ureter: Secondary | ICD-10-CM

## 2018-08-13 DIAGNOSIS — Z5112 Encounter for antineoplastic immunotherapy: Secondary | ICD-10-CM | POA: Diagnosis not present

## 2018-08-13 MED ORDER — HEPARIN SOD (PORK) LOCK FLUSH 100 UNIT/ML IV SOLN
500.0000 [IU] | Freq: Once | INTRAVENOUS | Status: AC | PRN
  Administered 2018-08-13: 500 [IU]
  Filled 2018-08-13: qty 5

## 2018-08-13 MED ORDER — SODIUM CHLORIDE 0.9 % IV SOLN
200.0000 mg | Freq: Once | INTRAVENOUS | Status: AC
  Administered 2018-08-13: 200 mg via INTRAVENOUS
  Filled 2018-08-13: qty 8

## 2018-08-13 MED ORDER — SODIUM CHLORIDE 0.9 % IV SOLN
Freq: Once | INTRAVENOUS | Status: AC
  Administered 2018-08-13: 13:00:00 via INTRAVENOUS
  Filled 2018-08-13: qty 250

## 2018-08-13 MED ORDER — HEPARIN SOD (PORK) LOCK FLUSH 100 UNIT/ML IV SOLN
500.0000 [IU] | Freq: Once | INTRAVENOUS | Status: AC
  Administered 2018-08-12: 500 [IU] via INTRAVENOUS

## 2018-08-13 NOTE — Addendum Note (Signed)
Addended by: Luella Cook on: 08/13/2018 11:23 AM   Modules accepted: Orders

## 2018-08-25 ENCOUNTER — Other Ambulatory Visit: Payer: Self-pay

## 2018-08-26 ENCOUNTER — Encounter: Payer: Self-pay | Admitting: Hospice and Palliative Medicine

## 2018-08-26 ENCOUNTER — Other Ambulatory Visit: Payer: Self-pay

## 2018-08-26 ENCOUNTER — Inpatient Hospital Stay (HOSPITAL_BASED_OUTPATIENT_CLINIC_OR_DEPARTMENT_OTHER): Payer: Medicare HMO | Admitting: Hospice and Palliative Medicine

## 2018-08-26 VITALS — BP 135/78 | HR 66 | Temp 97.7°F | Resp 18 | Ht 72.0 in | Wt 193.8 lb

## 2018-08-26 DIAGNOSIS — Z5112 Encounter for antineoplastic immunotherapy: Secondary | ICD-10-CM | POA: Diagnosis not present

## 2018-08-26 DIAGNOSIS — C669 Malignant neoplasm of unspecified ureter: Secondary | ICD-10-CM

## 2018-08-26 DIAGNOSIS — C661 Malignant neoplasm of right ureter: Secondary | ICD-10-CM | POA: Diagnosis not present

## 2018-08-26 DIAGNOSIS — C7801 Secondary malignant neoplasm of right lung: Secondary | ICD-10-CM | POA: Diagnosis not present

## 2018-08-26 DIAGNOSIS — Z515 Encounter for palliative care: Secondary | ICD-10-CM | POA: Diagnosis not present

## 2018-08-26 DIAGNOSIS — Z79899 Other long term (current) drug therapy: Secondary | ICD-10-CM

## 2018-08-26 DIAGNOSIS — Z7951 Long term (current) use of inhaled steroids: Secondary | ICD-10-CM

## 2018-08-26 DIAGNOSIS — I10 Essential (primary) hypertension: Secondary | ICD-10-CM

## 2018-08-26 NOTE — Progress Notes (Signed)
[place]  Telephone:([phone] [phone]   Name: Anthony Gonzales Date: 08/26/2018 MRN: [id number]  DOB: 10/21/1931  Patient Care Team: Idelle Crouch, MD as PCP - General (Internal Medicine)    REASON FOR CONSULTATION: Palliative Care consult requested for this 85 y.o. male with multiple medical problems including metastatic urothelial carcinoma with possible mets to the obturator node, indeterminate hilar lymph nodes, and left upper lobe lung nodule.  Patient is currently on treatment with second line Keytruda after not being able to tolerate carboplatinum/Gemzar.  He is status post TURBT.  He has had severe penile pain but is markedly improved following removal of ureteral stent.  He was referred to palliative care to help address goals.   SOCIAL HISTORY:     reports that he has never smoked. He has never used smokeless tobacco. He reports that he does not drink alcohol or use drugs.   Patient is married and lives at home with his wife of 25 years.  He has no children.  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  ADVANCE DIRECTIVES:  On file  CODE STATUS:   PAST MEDICAL HISTORY: Past Medical History:  Diagnosis Date  . Arthritis   . Benign fibroma of prostate 08/23/2013  . BPH (benign prostatic hyperplasia)   . Calculus of kidney 08/23/2013  . Glaucoma    no drops in 3 mo pressure good, pt denies glaucoma, eye pressure has been measuring alright.  . History of kidney stones   . HLD (hyperlipidemia)   . HOH (hard of hearing)    Left Hearing Aid  . HTN (hypertension) 12/26/2014  . Hypertension   . Hyponatremia 12/26/2014  . Migraines    history of migraines when he was younger.  [name] Restless leg syndrome   . Sinus drainage   . Skin cancer   . Urothelial cancer ([place])    chemo tx's.  [name] UTI (lower urinary tract infection) 12/26/2014  . Vertigo     PAST SURGICAL HISTORY:  Past  Surgical History:  Procedure Laterality Date  . COLONOSCOPY    . CYSTOSCOPY W/ RETROGRADES Right 01/30/2015   Procedure: CYSTOSCOPY WITH RETROGRADE PYELOGRAM;  Surgeon: [name], MD;  Location: [facility];  Service: Urology;  Laterality: Right;  . CYSTOSCOPY W/ RETROGRADES Bilateral 02/26/2016   Procedure: CYSTOSCOPY WITH RETROGRADE PYELOGRAM;  Surgeon: [name], MD;  Location: [facility];  Service: Urology;  Laterality: Bilateral;  . CYSTOSCOPY W/ URETERAL STENT PLACEMENT Right 08/20/2015   Procedure: CYSTOSCOPY WITH RETROGRADE PYELOGRAM/POSSIBLE URETERAL STENT PLACEMENT/BLADDER BIOPSY;  Surgeon: [name], MD;  Location: [facility];  Service: Urology;  Laterality: Right;  . CYSTOSCOPY W/ URETERAL STENT PLACEMENT Right 09/12/2015   Procedure: CYSTOSCOPY WITH STENT REPLACEMENT;  Surgeon: [name], MD;  Location: [facility];  Service: Urology;  Laterality: Right;  . CYSTOSCOPY WITH BIOPSY Right 09/12/2015   Procedure: CYSTOSCOPY WITH BLADDER AND URETERAL BIOPSY;  Surgeon: [name], MD;  Location: [facility];  Service: Urology;  Laterality: Right;  . CYSTOSCOPY WITH STENT PLACEMENT Right 01/30/2015   Procedure: CYSTOSCOPY WITH STENT PLACEMENT;  Surgeon: [name], MD;  Location: [facility];  Service: Urology;  Laterality: Right;  . CYSTOSCOPY WITH STENT PLACEMENT Right 12/28/2017   Procedure: [place] WITH STENT Exchange;  Surgeon: [name], MD;  Location: [facility];  Service: Urology;  Laterality: Right;  . CYSTOSCOPY/URETEROSCOPY/HOLMIUM LASER/STENT PLACEMENT Right 08/19/2017   Procedure: CYSTOSCOPY/URETEROSCOPY/HOLMIUM LASER/STENT PLACEMENT;  Surgeon: Erlene Quan,  Caryl Pina, MD;  Location: [facility];  Service: Urology;  Laterality: Right;  . EYE SURGERY Bilateral    Cataract Extraction with IOL  . goiter removal    . HOLMIUM LASER APPLICATION N/A 0/26/3785   Procedure:  HOLMIUM LASER APPLICATION;  Surgeon: [name], MD;  Location: [facility];  Service: Urology;   Laterality: N/A;  . PORTA CATH INSERTION N/A 09/23/2017   Procedure: PORTA CATH INSERTION;  Surgeon: Algernon Huxley, MD;  Location: [facility];  Service: Cardiovascular;  Laterality: N/A;  . SPERMATOCELECTOMY    . TONSILLECTOMY    . TRANSURETHRAL RESECTION OF BLADDER TUMOR N/A 02/26/2016   Procedure: TRANSURETHRAL RESECTION OF BLADDER TUMOR (TURBT);  Surgeon: [name], MD;  Location: [facility];  Service: Urology;  Laterality: N/A;  . TRANSURETHRAL RESECTION OF BLADDER TUMOR WITH MITOMYCIN-C N/A 09/12/2015   Procedure: TRANSURETHRAL RESECTION OF BLADDER TUMOR ;  Surgeon: [name], MD;  Location: [facility];  Service: Urology;  Laterality: N/A;  . TRANSURETHRAL RESECTION OF BLADDER TUMOR WITH MITOMYCIN-C N/A 03/24/2016   Procedure: TRANSURETHRAL RESECTION OF BLADDER TUMOR WITH MITOMYCIN-C  (SMALL);  Surgeon: [name], MD;  Location: [facility];  Service: Urology;  Laterality: N/A;  . URETERAL BIOPSY Right 08/19/2017   Procedure: Renal Mass BIOPSY;  Surgeon: [name], MD;  Location: [facility];  Service: Urology;  Laterality: Right;  . URETEROSCOPY Right 01/30/2015   Procedure: URETEROSCOPY/ WITH BIOPSY AND CYTOLOGY BRUSHING;  Surgeon: [name], MD;  Location: [facility];  Service: Urology;  Laterality: Right;  . URETEROSCOPY Right 08/20/2015   Procedure: URETEROSCOPY;  Surgeon: [name], MD;  Location: [facility];  Service: Urology;  Laterality: Right;  . URETEROSCOPY Right 09/12/2015   Procedure: URETEROSCOPY;  Surgeon: [name], MD;  Location: [facility];  Service: Urology;  Laterality: Right;  . 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[place])   09/22/2017 - 06/07/2018 Chemotherapy   The patient had palonosetron (ALOXI) injection 0.25 mg, 0.25 mg, Intravenous,  Once, 9 of 10 cycles Administration: 0.25 mg (09/22/2017), 0.25 mg (10/06/2017), 0.25 mg (10/20/2017), 0.25 mg (11/03/2017), 0.25 mg (11/17/2017), 0.25 mg (12/01/2017), 0.25 mg (12/15/2017), 0.25 mg (12/29/2017), 0.25 mg (01/12/2018), 0.25 mg (01/26/2018), 0.25 mg (02/09/2018), 0.25 mg (02/23/2018), 0.25 mg (03/16/2018), 0.25 mg (04/13/2018), 0.25 mg (05/11/2018) pegfilgrastim-cbqv (UDENYCA) injection 6 mg, 6 mg, Subcutaneous, Once, 5 of 6 cycles Administration: 6 mg (01/13/2018), 6 mg (02/10/2018), 6 mg (03/17/2018), 6 mg (04/15/2018), 6 mg (05/12/2018), 6 mg (05/26/2018) CARBOplatin (PARAPLATIN) 160 mg in sodium chloride 0.9 % 250 mL chemo infusion, 160 mg (100 % of original dose 163.2 mg), Intravenous,  Once, 9 of 10 cycles Dose modification:   (original dose 163.2 mg, Cycle 1) Administration: 160 mg (09/22/2017), 170 mg (10/06/2017), 170 mg (10/20/2017), 170 mg (11/17/2017), 160 mg (12/01/2017), 170 mg (11/03/2017), 160 mg (12/15/2017), 160 mg (12/29/2017), 180 mg (01/12/2018), 180 mg (01/26/2018), 160 mg (02/09/2018), 160 mg (02/23/2018), 160 mg (03/16/2018), 160 mg (03/30/2018), 160 mg (04/13/2018), 180 mg (04/27/2018), 180 mg (05/11/2018), 160 mg (05/25/2018) gemcitabine (GEMZAR) 1,600 mg in  sodium chloride 0.9 % 250 mL chemo infusion, 1,672 mg (100 % of original dose 800 mg/m2), Intravenous,  Once, 9 of 10 cycles Dose modification: 800 mg/m2 (original dose 800 mg/m2, Cycle 1, Reason: Patient Age) Administration: 1,600 mg (09/22/2017), 1,600 mg (10/06/2017), 1,600 mg (10/20/2017), 1,600 mg (11/03/2017), 1,600 mg (11/17/2017), 1,600 mg (12/01/2017), 1,600 mg (12/15/2017), 1,600 mg (12/29/2017), 1,600 mg (01/12/2018), 1,600 mg (01/26/2018), 1,600 mg (02/09/2018), 1,600 mg (02/23/2018), 1,600 mg  (03/16/2018), 1,600 mg (03/30/2018), 1,600 mg (04/13/2018), 1,600 mg (04/27/2018), 1,600 mg (05/11/2018), 1,600 mg (05/25/2018)  for chemotherapy treatment.    06/08/2018 -  Chemotherapy   The patient had pembrolizumab (KEYTRUDA) 200 mg in sodium chloride 0.9 % 50 mL chemo infusion, 200 mg, Intravenous, Once, 4 of 5 cycles Administration: 200 mg (06/08/2018), 200 mg (06/29/2018), 200 mg (07/20/2018), 200 mg (08/13/2018)  for chemotherapy treatment.      ALLERGIES:  is allergic to demerol [meperidine]; lipitor [atorvastatin]; and sulfa antibiotics.  MEDICATIONS:  Current Outpatient Medications  Medication Sig Dispense Refill  . acetaminophen (TYLENOL) 500 MG tablet Take 1,000 mg by mouth daily as needed for moderate pain.     [name] amLODipine (NORVASC) 5 MG tablet Take 5 mg by mouth daily.     . benazepril-hydrochlorthiazide (LOTENSIN HCT) 20-12.5 MG tablet Take 1 tablet by mouth daily.     . diazepam (VALIUM) 5 MG tablet TAKE 1 TABLET (5 MG TOTAL) BY MOUTH EVERY 12 (TWELVE) HOURS AS NEEDED FOR ANXIETY OR SLEEP    . docusate sodium (COLACE) 100 MG capsule Take 100 mg by mouth 2 (two) times daily.     . fenofibrate micronized (LOFIBRA) 134 MG capsule Take 134 mg by mouth daily before breakfast.     . fluticasone (FLONASE) 50 MCG/ACT nasal spray Place 2 sprays into both nostrils at bedtime.    [name] HYDROcodone-acetaminophen (NORCO/VICODIN) 5-325 MG tablet Take 1 tablet by mouth every 6 (six) hours as needed for severe pain. 30 tablet 0  . hydrocortisone cream 1 % Apply 1 application topically daily as needed for itching.    . Multiple Vitamin (MULTIVITAMIN WITH MINERALS) TABS tablet Take 1 tablet by mouth daily.    . Potassium 99 MG TABS Take 99 mg by mouth at bedtime.    . psyllium (METAMUCIL) 58.6 % packet Take 1 packet by mouth daily.     . tamsulosin (FLOMAX) 0.4 MG CAPS capsule Take 1 capsule (0.4 mg total) by mouth daily. 90 capsule 3  . Probiotic Product (PROBIOTIC-10 PO) Take by mouth every morning.      No current facility-administered medications for this visit.     VITAL SIGNS: There were no vitals taken for this visit. There were no vitals filed for this visit.  Estimated body mass index is 26.04 kg/m as calculated from the following:   Height as of 08/11/18: 6' (1.829 m).   Weight as of 08/11/18: 192 lb (87.1 kg).  LABS: CBC:    Component Value Date/Time   WBC 3.6 (L) 08/12/2018 0843   HGB 12.5 (L) 08/12/2018 0843   HGB 14.1 09/04/2017 0949   HCT 36.5 (L) 08/12/2018 0843   HCT 40.5 09/04/2017 0949   PLT 204 08/12/2018 0843   PLT 282 09/04/2017 0949   MCV 88.0 08/12/2018 0843   MCV 86 09/04/2017 0949   MCV 88 03/23/2013 0418   NEUTROABS 1.9 08/12/2018 0843   NEUTROABS 4.6 09/04/2017 0949   NEUTROABS 2.8 03/23/2013 0418   LYMPHSABS 1.1 08/12/2018 0843   LYMPHSABS 1.9 09/04/2017  0949   LYMPHSABS 1.2 03/23/2013 0418   MONOABS 0.4 08/12/2018 0843   MONOABS 0.8 03/23/2013 0418   EOSABS 0.1 08/12/2018 0843   EOSABS 0.3 09/04/2017 0949   EOSABS 0.1 03/23/2013 0418   BASOSABS 0.0 08/12/2018 0843   BASOSABS 0.0 09/04/2017 0949   BASOSABS 0.0 03/23/2013 0418   Comprehensive Metabolic Panel:    Component Value Date/Time   NA 134 (L) 08/12/2018 0843   NA 140 03/23/2013 0418   K 3.8 08/12/2018 0843   K 3.8 03/23/2013 0418   CL 98 08/12/2018 0843   CL 111 (H) 03/23/2013 0418   CO2 29 08/12/2018 0843   CO2 24 03/23/2013 0418   BUN 18 08/12/2018 0843   BUN 24 (H) 03/23/2013 0418   CREATININE 1.19 08/12/2018 0843   CREATININE 1.79 (H) 03/23/2013 0418   GLUCOSE 122 (H) 08/12/2018 0843   GLUCOSE 98 03/23/2013 0418   CALCIUM 9.0 08/12/2018 0843   CALCIUM 8.0 (L) 03/23/2013 0418   AST 32 08/12/2018 0843   AST 40 (H) 03/22/2013 0451   ALT 21 08/12/2018 0843   ALT 28 03/22/2013 0451   ALKPHOS 45 08/12/2018 0843   ALKPHOS 32 (L) 03/22/2013 0451   BILITOT 0.7 08/12/2018 0843   BILITOT 0.3 03/22/2013 0451   PROT 6.6 08/12/2018 0843   PROT 5.5 (L) 03/22/2013 0451   ALBUMIN  3.7 08/12/2018 0843   ALBUMIN 2.7 (L) 03/22/2013 0451    RADIOGRAPHIC STUDIES: No results found.  PERFORMANCE STATUS (ECOG) : 1 - Symptomatic but completely ambulatory  Review of Systems Unless otherwise noted, a complete review of systems is negative.  Physical Exam General: NAD, frail appearing, thin Cardiovascular: regular rate and rhythm Pulmonary: clear ant fields Abdomen: soft, nontender, + bowel sounds GU: no suprapubic tenderness Extremities: no edema, no joint deformities Skin: no rashes Neurological: Weakness but otherwise nonfocal  IMPRESSION: I met with patient today in the clinic.  Introduced palliative care services and to establish therapeutic rapport.  Patient reports that he is doing well following the removal of his ureteral stent.  He currently denies any pain or other distressing symptoms.  He says that his appetite is good.  He is functionally independent, although he says that some tasks such as mowing the yard take longer than they used to.  Patient says that he recognizes that the cancer will worsen over time.  He fully anticipates the cancer eventually causing his death.  Patient says that he has thought a lot about his end-of-life.  He feels he has lived a good life and is excepting when it is time.  Patient brought his ACP documents to the clinic today.  We will scan those in the chart.  I reviewed with him a MOST Form and he took at home to discuss with his wife.  He does not seem keen on the idea of resuscitative measures but will more fully discuss at the time the next clinic visit.  PLAN: -Continue current scope of treatment -MOST Form reviewed and will be completed during future clinic visit -RTC in 4- 6 weeks   Patient expressed understanding and was in agreement with this plan. He also understands that He can call the clinic at any time with any questions, concerns, or complaints.     Time Total: 30 minutes  Visit consisted of counseling  and education dealing with the complex and emotionally intense issues of symptom management and palliative care in the setting of serious and potentially life-threatening illness.Greater than 50%  of this time was spent counseling and coordinating care related to the above assessment and plan.  Signed by: Altha Harm, PhD, NP-C [phone] (Work Cell)

## 2018-08-26 NOTE — Progress Notes (Signed)
Pt has no pain since getting stent out. He does feel like he is weaker because he does not get out and move around since covid virus. He stays at home and sit around. I suggested that he could walk in his neighborhood. He said the neighborhood is not safe and he walks in his driveway but it is not a lot of exercise. Eating and drinking good. Bowels and bladder working fine

## 2018-09-02 ENCOUNTER — Ambulatory Visit: Payer: Medicare HMO

## 2018-09-02 ENCOUNTER — Ambulatory Visit: Payer: Medicare HMO | Admitting: Oncology

## 2018-09-02 ENCOUNTER — Other Ambulatory Visit: Payer: Medicare HMO

## 2018-09-03 ENCOUNTER — Other Ambulatory Visit: Payer: Self-pay

## 2018-09-03 ENCOUNTER — Inpatient Hospital Stay (HOSPITAL_BASED_OUTPATIENT_CLINIC_OR_DEPARTMENT_OTHER): Payer: Medicare HMO | Admitting: Oncology

## 2018-09-03 ENCOUNTER — Inpatient Hospital Stay: Payer: Medicare HMO

## 2018-09-03 ENCOUNTER — Encounter: Payer: Self-pay | Admitting: Oncology

## 2018-09-03 VITALS — BP 137/85 | HR 76 | Temp 97.4°F | Resp 18 | Wt 194.6 lb

## 2018-09-03 DIAGNOSIS — C669 Malignant neoplasm of unspecified ureter: Secondary | ICD-10-CM

## 2018-09-03 DIAGNOSIS — Z7951 Long term (current) use of inhaled steroids: Secondary | ICD-10-CM | POA: Diagnosis not present

## 2018-09-03 DIAGNOSIS — Z5112 Encounter for antineoplastic immunotherapy: Secondary | ICD-10-CM | POA: Diagnosis not present

## 2018-09-03 DIAGNOSIS — Z79899 Other long term (current) drug therapy: Secondary | ICD-10-CM

## 2018-09-03 DIAGNOSIS — Z85828 Personal history of other malignant neoplasm of skin: Secondary | ICD-10-CM

## 2018-09-03 DIAGNOSIS — I10 Essential (primary) hypertension: Secondary | ICD-10-CM

## 2018-09-03 DIAGNOSIS — C661 Malignant neoplasm of right ureter: Secondary | ICD-10-CM | POA: Diagnosis not present

## 2018-09-03 DIAGNOSIS — Z8249 Family history of ischemic heart disease and other diseases of the circulatory system: Secondary | ICD-10-CM

## 2018-09-03 DIAGNOSIS — Z95828 Presence of other vascular implants and grafts: Secondary | ICD-10-CM

## 2018-09-03 DIAGNOSIS — C7801 Secondary malignant neoplasm of right lung: Secondary | ICD-10-CM

## 2018-09-03 LAB — COMPREHENSIVE METABOLIC PANEL
ALT: 17 U/L (ref 0–44)
AST: 26 U/L (ref 15–41)
Albumin: 3.7 g/dL (ref 3.5–5.0)
Alkaline Phosphatase: 56 U/L (ref 38–126)
Anion gap: 7 (ref 5–15)
BUN: 18 mg/dL (ref 8–23)
CO2: 28 mmol/L (ref 22–32)
Calcium: 8.8 mg/dL — ABNORMAL LOW (ref 8.9–10.3)
Chloride: 99 mmol/L (ref 98–111)
Creatinine, Ser: 1.12 mg/dL (ref 0.61–1.24)
GFR calc Af Amer: >60 mL/min (ref >60)
GFR calc non Af Amer: 60 mL/min — ABNORMAL LOW (ref >60)
Glucose, Bld: 119 mg/dL — ABNORMAL HIGH (ref 70–99)
Potassium: 4 mmol/L (ref 3.5–5.1)
Sodium: 134 mmol/L — ABNORMAL LOW (ref 135–145)
Total Bilirubin: 0.4 mg/dL (ref 0.3–1.2)
Total Protein: 6.4 g/dL — ABNORMAL LOW (ref 6.5–8.1)

## 2018-09-03 LAB — CBC WITH DIFFERENTIAL/PLATELET
Abs Immature Granulocytes: 0.01 10*3/uL (ref 0.00–0.07)
Basophils Absolute: 0 10*3/uL (ref 0.0–0.1)
Basophils Relative: 1 %
Eosinophils Absolute: 0.1 10*3/uL (ref 0.0–0.5)
Eosinophils Relative: 3 %
HCT: 36.4 % — ABNORMAL LOW (ref 39.0–52.0)
Hemoglobin: 12.4 g/dL — ABNORMAL LOW (ref 13.0–17.0)
Immature Granulocytes: 0 %
Lymphocytes Relative: 28 %
Lymphs Abs: 1.2 10*3/uL (ref 0.7–4.0)
MCH: 29.7 pg (ref 26.0–34.0)
MCHC: 34.1 g/dL (ref 30.0–36.0)
MCV: 87.3 fL (ref 80.0–100.0)
Monocytes Absolute: 0.5 10*3/uL (ref 0.1–1.0)
Monocytes Relative: 11 %
Neutro Abs: 2.5 10*3/uL (ref 1.7–7.7)
Neutrophils Relative %: 57 %
Platelets: 211 10*3/uL (ref 150–400)
RBC: 4.17 MIL/uL — ABNORMAL LOW (ref 4.22–5.81)
RDW: 12.9 % (ref 11.5–15.5)
WBC: 4.4 10*3/uL (ref 4.0–10.5)
nRBC: 0 % (ref 0.0–0.2)

## 2018-09-03 MED ORDER — HEPARIN SOD (PORK) LOCK FLUSH 100 UNIT/ML IV SOLN
500.0000 [IU] | Freq: Once | INTRAVENOUS | Status: AC
  Administered 2018-09-03: 500 [IU] via INTRAVENOUS

## 2018-09-03 MED ORDER — SODIUM CHLORIDE 0.9% FLUSH
10.0000 mL | Freq: Once | INTRAVENOUS | Status: AC
  Administered 2018-09-03: 10 mL via INTRAVENOUS
  Filled 2018-09-03: qty 10

## 2018-09-03 MED ORDER — SODIUM CHLORIDE 0.9 % IV SOLN
200.0000 mg | Freq: Once | INTRAVENOUS | Status: AC
  Administered 2018-09-03: 11:00:00 200 mg via INTRAVENOUS
  Filled 2018-09-03: qty 8

## 2018-09-03 MED ORDER — SODIUM CHLORIDE 0.9 % IV SOLN
Freq: Once | INTRAVENOUS | Status: AC
  Administered 2018-09-03: 10:00:00 via INTRAVENOUS
  Filled 2018-09-03: qty 250

## 2018-09-03 NOTE — Progress Notes (Signed)
Hematology/Oncology Consult note [facility]  [phone] [phone]  Patient Care Team: Idelle Crouch, MD as PCP - General (Internal Medicine)   Name of the patient: Anthony Gonzales  [id number]  Jul 23, 1931   Date of visit: 09/03/18  Diagnosis- Metastatic urothelial carcinoma with possible mets to the obturator node. Indeterminate hilar lymph nodes and LUL lung nodule   Chief complaint/ Reason for visit-on treatment assessment prior to cycle 4 of Keytruda  Heme/Onc history: patient is a 85 year old male with past medical history significant for hypertension and long-standing history of superficial bladder cancer for which he sees [name]. He has undergone TURBT as well as ureteroscopy since 2017 along with mitomycin as well in the past. Most recently he underwent CT abdomen on 08/06/2017 which showed a soft tissue mass in the right renal pelvis concerning for upper tract urothelial neoplasm. Soft tissue fullness at the right ureterovesical junction with proximal right hydroureteronephrosis. Several millimeter attenuation lesion in the pancreatic head.  He underwent diagnostic ureteroscopy and was found to have 2 high-grade lesions within his right kidney. There was a nodular high-grade appearing lesion in the right anterior renal pelvis. There was also a second ureteral tumor fungating from the right ureteral orifice extending into the distal ureter also consistent with high-grade invasive urothelial carcinoma. Muscle invasion could not be assessed. He also underwent a CT chest which showed a rounded nodule in the right upper lobe measuring 14 mm concerning for metastases. 2 other 3 mm lesions were also noted in the left upper lobe likely benign  Plan initially was neoadjuvant chemotherapy followed by possible surgery but given the presence of lung lesion patient has been referred to oncology for the same.  PET/CT on  09/21/17 showed:IMPRESSION: 1. Hypermetabolic right obturator lymph node is most indicative of metastatic disease. 2. Mildly hypermetabolic left hilar lymph nodes are nonspecific. Continued attention on follow-up exams is warranted. 3. Right upper lobe pulmonary nodule shows metabolism after just above blood pool and is therefore indeterminate. Continued attention on follow-up exams is warranted. 4. Aortic atherosclerosis (ICD10-170.0). Coronary artery calcification. 5. Cholelithiasis  Carboplatin/ gemzar1 week on and one-week off as patient could not tolerate 2-week on and one week off regimen. Cycle 1 started on 09/22/2017 Disease progression in March 2020. Switched to second [employer]    Interval history-patient reports significant improvement in his penile pain at this time after his stent was removed.  He reports little to no pain.  Also reports no problems with urination.  ECOG PS- 1 Pain scale- 0 Opioid associated constipation- no  Review of systems- Review of Systems  Constitutional: Positive for malaise/fatigue. Negative for chills, fever and weight loss.  HENT: Negative for congestion, ear discharge and nosebleeds.   Eyes: Negative for blurred vision.  Respiratory: Negative for cough, hemoptysis, sputum production, shortness of breath and wheezing.   Cardiovascular: Negative for chest pain, palpitations, orthopnea and claudication.  Gastrointestinal: Negative for abdominal pain, blood in stool, constipation, diarrhea, heartburn, melena, nausea and vomiting.  Genitourinary: Negative for dysuria, flank pain, frequency, hematuria and urgency.  Musculoskeletal: Negative for back pain, joint pain and myalgias.  Skin: Negative for rash.  Neurological: Negative for dizziness, tingling, focal weakness, seizures, weakness and headaches.  Endo/Heme/Allergies: Does not bruise/bleed easily.  Psychiatric/Behavioral: Negative for depression and suicidal ideas. The patient  does not have insomnia.        Allergies  Allergen Reactions  . Demerol [Meperidine] Nausea And Vomiting  . Lipitor [Atorvastatin]  Swelling  . Sulfa Antibiotics Nausea And Vomiting and Rash     Past Medical History:  Diagnosis Date  . Arthritis   . Benign fibroma of prostate 08/23/2013  . BPH (benign prostatic hyperplasia)   . Calculus of kidney 08/23/2013  . Glaucoma    no drops in 3 mo pressure good, pt denies glaucoma, eye pressure has been measuring alright.  . History of kidney stones   . HLD (hyperlipidemia)   . HOH (hard of hearing)    Left Hearing Aid  . HTN (hypertension) 12/26/2014  . Hypertension   . Hyponatremia 12/26/2014  . Migraines    history of migraines when he was younger.  [name] Restless leg syndrome   . Sinus drainage   . Skin cancer   . Urothelial cancer ([place])    chemo tx's.  [name] UTI (lower urinary tract infection) 12/26/2014  . Vertigo      Past Surgical History:  Procedure Laterality Date  . COLONOSCOPY    . CYSTOSCOPY W/ RETROGRADES Right 01/30/2015   Procedure: CYSTOSCOPY WITH RETROGRADE PYELOGRAM;  Surgeon: [name], MD;  Location: [facility];  Service: Urology;  Laterality: Right;  . CYSTOSCOPY W/ RETROGRADES Bilateral 02/26/2016   Procedure: CYSTOSCOPY WITH RETROGRADE PYELOGRAM;  Surgeon: [name], MD;  Location: [facility];  Service: Urology;  Laterality: Bilateral;  . CYSTOSCOPY W/ URETERAL STENT PLACEMENT Right 08/20/2015   Procedure: CYSTOSCOPY WITH RETROGRADE PYELOGRAM/POSSIBLE URETERAL STENT PLACEMENT/BLADDER BIOPSY;  Surgeon: [name], MD;  Location: [facility];  Service: Urology;  Laterality: Right;  . CYSTOSCOPY W/ URETERAL STENT PLACEMENT Right 09/12/2015   Procedure: CYSTOSCOPY WITH STENT REPLACEMENT;  Surgeon: [name], MD;  Location: [facility];  Service: Urology;  Laterality: Right;  . CYSTOSCOPY WITH BIOPSY Right 09/12/2015   Procedure: CYSTOSCOPY WITH BLADDER AND URETERAL BIOPSY;  Surgeon: [name], MD;   Location: [facility];  Service: Urology;  Laterality: Right;  . CYSTOSCOPY WITH STENT PLACEMENT Right 01/30/2015   Procedure: CYSTOSCOPY WITH STENT PLACEMENT;  Surgeon: [name], MD;  Location: [facility];  Service: Urology;  Laterality: Right;  . CYSTOSCOPY WITH STENT PLACEMENT Right 12/28/2017   Procedure: [place] WITH STENT Exchange;  Surgeon: [name], MD;  Location: [facility];  Service: Urology;  Laterality: Right;  . CYSTOSCOPY/URETEROSCOPY/HOLMIUM LASER/STENT PLACEMENT Right 08/19/2017   Procedure: CYSTOSCOPY/URETEROSCOPY/HOLMIUM LASER/STENT PLACEMENT;  Surgeon: [name], MD;  Location: [facility];  Service: Urology;  Laterality: Right;  . EYE SURGERY Bilateral    Cataract Extraction with IOL  . goiter removal    . HOLMIUM LASER APPLICATION N/A 3/76/2831   Procedure:  HOLMIUM LASER APPLICATION;  Surgeon: [name], MD;  Location: [facility];  Service: Urology;  Laterality: N/A;  . PORTA CATH INSERTION N/A 09/23/2017   Procedure: PORTA CATH INSERTION;  Surgeon: Algernon Huxley, MD;  Location: [facility];  Service: Cardiovascular;  Laterality: N/A;  . SPERMATOCELECTOMY    . TONSILLECTOMY    . TRANSURETHRAL RESECTION OF BLADDER TUMOR N/A 02/26/2016   Procedure: TRANSURETHRAL RESECTION OF BLADDER TUMOR (TURBT);  Surgeon: [name], MD;  Location: [facility];  Service: Urology;  Laterality: N/A;  . TRANSURETHRAL RESECTION OF BLADDER TUMOR WITH MITOMYCIN-C N/A 09/12/2015   Procedure: TRANSURETHRAL RESECTION OF BLADDER TUMOR ;  Surgeon: [name], MD;  Location: [facility];  Service: Urology;  Laterality: N/A;  . TRANSURETHRAL RESECTION OF BLADDER TUMOR WITH MITOMYCIN-C N/A 03/24/2016   Procedure: TRANSURETHRAL RESECTION OF BLADDER TUMOR WITH MITOMYCIN-C  (SMALL);  Surgeon: [name], MD;  Location:  [facility];  Service: Urology;  Laterality: N/A;  . URETERAL BIOPSY Right 08/19/2017   Procedure: Renal Mass BIOPSY;  Surgeon: [name], MD;  Location: [facility];   Service: Urology;  Laterality: Right;  . URETEROSCOPY Right 01/30/2015   Procedure: URETEROSCOPY/ WITH BIOPSY AND CYTOLOGY BRUSHING;  Surgeon: [name], MD;  Location: [facility];  Service: Urology;  Laterality: Right;  . URETEROSCOPY Right 08/20/2015   Procedure: URETEROSCOPY;  Surgeon: [name], MD;  Location: [facility];  Service: Urology;  Laterality: Right;  . URETEROSCOPY Right 09/12/2015   Procedure: URETEROSCOPY;  Surgeon: [name], MD;  Location: [facility];  Service: Urology;  Laterality: Right;  . URETEROSCOPY Right 02/26/2016   Procedure: URETEROSCOPY;  Surgeon: [name], MD;  Location: [facility];  Service: Urology;  Laterality: Right;    Social History   Socioeconomic History  . Marital status: Married    Spouse name: Not on file  . Number of children: Not on file  . Years of education: Not on file  . Highest education level: Not on file  Occupational History  . Not on file  Social Needs  . Financial resource strain: Not on file  . Food insecurity    Worry: Not on file    Inability: Not on file  . Transportation needs    Medical: Not on file    Non-medical: Not on file  Tobacco Use  . Smoking status: Never Smoker  . Smokeless tobacco: Never Used  Substance and Sexual Activity  . Alcohol use: No    Alcohol/week: 0.0 standard drinks  . Drug use: No  . Sexual activity: Not on file  Lifestyle  . Physical activity    Days per week: Not on file    Minutes per session: Not on file  . Stress: Not on file  Relationships  . Social Herbalist on phone: Not on file    Gets together: Not on file    Attends religious service: Not on file    Active member of club or organization: Not on file    Attends meetings of clubs or organizations: Not on file    Relationship status: Not on file  . Intimate partner violence    Fear of current or ex partner: Not on file    Emotionally abused: Not on file    Physically abused: Not on file    Forced  sexual activity: Not on file  Other Topics Concern  . Not on file  Social History Narrative  . Not on file    Family History  Problem Relation Age of Onset  . Hypertension Mother   . Hypertension Father   . Prostate cancer Brother   . Kidney disease Neg Hx   . Kidney cancer Neg Hx   . Bladder Cancer Neg Hx      Current Outpatient Medications:  .  acetaminophen (TYLENOL) 500 MG tablet, Take 1,000 mg by mouth daily as needed for moderate pain. , Disp: , Rfl:  .  amLODipine (NORVASC) 5 MG tablet, Take 5 mg by mouth daily. , Disp: , Rfl:  .  benazepril-hydrochlorthiazide (LOTENSIN HCT) 20-12.5 MG tablet, Take 1 tablet by mouth daily. , Disp: , Rfl:  .  diazepam (VALIUM) 5 MG tablet, TAKE 1 TABLET (5 MG TOTAL) BY MOUTH EVERY 12 (TWELVE) HOURS AS NEEDED FOR ANXIETY OR SLEEP, Disp: , Rfl:  .  docusate sodium (COLACE) 100 MG capsule, Take 100 mg by mouth 2 (two) times daily. ,  Disp: , Rfl:  .  fenofibrate micronized (LOFIBRA) 134 MG capsule, Take 134 mg by mouth daily before breakfast. , Disp: , Rfl:  .  fluticasone (FLONASE) 50 MCG/ACT nasal spray, Place 2 sprays into both nostrils at bedtime., Disp: , Rfl:  .  HYDROcodone-acetaminophen (NORCO/VICODIN) 5-325 MG tablet, Take 1 tablet by mouth every 6 (six) hours as needed for severe pain., Disp: 30 tablet, Rfl: 0 .  hydrocortisone cream 1 %, Apply 1 application topically daily as needed for itching., Disp: , Rfl:  .  Multiple Vitamin (MULTIVITAMIN WITH MINERALS) TABS tablet, Take 1 tablet by mouth daily., Disp: , Rfl:  .  Potassium 99 MG TABS, Take 99 mg by mouth at bedtime., Disp: , Rfl:  .  Probiotic Product (PROBIOTIC-10 PO), Take by mouth every morning., Disp: , Rfl:  .  psyllium (METAMUCIL) 58.6 % packet, Take 1 packet by mouth daily. , Disp: , Rfl:  .  tamsulosin (FLOMAX) 0.4 MG CAPS capsule, Take 1 capsule (0.4 mg total) by mouth daily., Disp: 90 capsule, Rfl: 3 No current facility-administered medications for this visit.    Facility-Administered Medications Ordered in Other Visits:  .  pembrolizumab (KEYTRUDA) 200 mg in sodium chloride 0.9 % 50 mL chemo infusion, 200 mg, Intravenous, Once, Sindy Guadeloupe, MD  Physical exam:  Vitals:   09/03/18 0937  BP: 137/85  Pulse: 76  Resp: 18  Temp: (!) 97.4 F (36.3 C)  TempSrc: Tympanic  Weight: 194 lb 9.6 oz (88.3 kg)   Physical Exam Constitutional:      General: He is not in acute distress. HENT:     Head: Normocephalic and atraumatic.  Eyes:     Pupils: Pupils are equal, round, and reactive to light.  Neck:     Musculoskeletal: Normal range of motion.  Cardiovascular:     Rate and Rhythm: Normal rate and regular rhythm.     Heart sounds: Normal heart sounds.  Pulmonary:     Effort: Pulmonary effort is normal.     Breath sounds: Normal breath sounds.  Abdominal:     General: Bowel sounds are normal.     Palpations: Abdomen is soft.  Skin:    General: Skin is warm and dry.  Neurological:     Mental Status: He is alert and oriented to person, place, and time.      CMP Latest Ref Rng & Units 09/03/2018  Glucose 70 - 99 mg/dL 119(H)  BUN 8 - 23 mg/dL 18  Creatinine 0.61 - 1.24 mg/dL 1.12  Sodium 135 - 145 mmol/L 134(L)  Potassium 3.5 - 5.1 mmol/L 4.0  Chloride 98 - 111 mmol/L 99  CO2 22 - 32 mmol/L 28  Calcium 8.9 - 10.3 mg/dL 8.8(L)  Total Protein 6.5 - 8.1 g/dL 6.4(L)  Total Bilirubin 0.3 - 1.2 mg/dL 0.4  Alkaline Phos 38 - 126 U/L 56  AST 15 - 41 U/L 26  ALT 0 - 44 U/L 17   CBC Latest Ref Rng & Units 09/03/2018  WBC 4.0 - 10.5 K/uL 4.4  Hemoglobin 13.0 - 17.0 g/dL 12.4(L)  Hematocrit 39.0 - 52.0 % 36.4(L)  Platelets 150 - 400 K/uL 211      Assessment and plan- Patient is a 85 y.o. 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  He is here for on treatment assessment prior to cycle 4 of Keytruda  Counts  okay to  proceed with cycle 4 of Keytruda today.  I will see him back in 3 weeks time with CBC with differential, CMP prior to cycle 5.  I will plan to get CT chest abdomen and pelvis with contrast 2 weeks from now.  Overall patient is tolerating immunotherapy well without any significant side effects.  Patient mainly had urinary symptoms including penile pain and hesitancy while passing urine when he had the stent in place and that has resolved after the stent was removed   Visit Diagnosis 1. Encounter for antineoplastic immunotherapy   2. 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 Randa Evens, MD, MPH [facility] [phone] 09/03/2018 10:11 AM

## 2018-09-03 NOTE — Progress Notes (Signed)
Pt here for follow up. Denies any concerns.  

## 2018-09-08 ENCOUNTER — Ambulatory Visit: Payer: Medicare HMO | Admitting: Urology

## 2018-09-09 ENCOUNTER — Other Ambulatory Visit: Payer: Self-pay

## 2018-09-09 ENCOUNTER — Ambulatory Visit
Admission: RE | Admit: 2018-09-09 | Discharge: 2018-09-09 | Disposition: A | Discharge status: Home / Self Care | Payer: Medicare HMO | Source: Ambulatory Visit | Attending: Urology | Admitting: Urology

## 2018-09-09 DIAGNOSIS — N1330 Unspecified hydronephrosis: Secondary | ICD-10-CM | POA: Insufficient documentation

## 2018-09-13 ENCOUNTER — Telehealth: Payer: Self-pay | Admitting: *Deleted

## 2018-09-13 NOTE — Telephone Encounter (Signed)
Patient called and states he was to be scheduled a CT prior to his next appointment and it has not been scheduled  Follow-up and Disposition History  09/03/2018 0949 - Sindy Guadeloupe, MD  Check-out note:  Keytruda today. Return to clinic in 3 weeks. Port labs: CBC with differential, CMP. See Dr. Janese Banks. Gets Keytruda. Needs CT chest abdomen and pelvis with contrast prior  --acr

## 2018-09-17 ENCOUNTER — Other Ambulatory Visit: Payer: Self-pay

## 2018-09-17 ENCOUNTER — Ambulatory Visit
Admission: RE | Admit: 2018-09-17 | Discharge: 2018-09-17 | Disposition: A | Discharge status: Home / Self Care | Payer: Medicare HMO | Source: Ambulatory Visit | Attending: Oncology | Admitting: Oncology

## 2018-09-17 DIAGNOSIS — K862 Cyst of pancreas: Secondary | ICD-10-CM | POA: Insufficient documentation

## 2018-09-17 DIAGNOSIS — Z5112 Encounter for antineoplastic immunotherapy: Secondary | ICD-10-CM

## 2018-09-17 DIAGNOSIS — R918 Other nonspecific abnormal finding of lung field: Secondary | ICD-10-CM | POA: Diagnosis not present

## 2018-09-17 DIAGNOSIS — C669 Malignant neoplasm of unspecified ureter: Secondary | ICD-10-CM | POA: Diagnosis not present

## 2018-09-17 MED ORDER — IOHEXOL 300 MG/ML  SOLN
100.0000 mL | Freq: Once | INTRAMUSCULAR | Status: AC | PRN
  Administered 2018-09-17: 100 mL via INTRAVENOUS

## 2018-09-19 IMAGING — CT NM PET TUM IMG INITIAL (PI) SKULL BASE T - THIGH
10 series · 22 of 25 positions shown · non-contrast
Comparison: CT chest 09/04/2017 and CT abdomen pelvis 08/06/2017.

CLINICAL DATA: Initial treatment strategy for urothelial carcinoma.

EXAM:
NUCLEAR MEDICINE PET SKULL BASE TO THIGH
TECHNIQUE: 10.9 mCi F-18 FDG was injected intravenously. Full-ring PET imaging
was performed from the skull base to thigh after the radiotracer. CT
data was obtained and used for attenuation correction and anatomic
localization.
Fasting blood glucose: 92 mg/dl

[Series 3: ct wb 5.0 b30f · axial · 5.0mm · 0.98mm/px · z∈[-290,+694]mm · 3 of 329 slices shown]
[im 1/329]
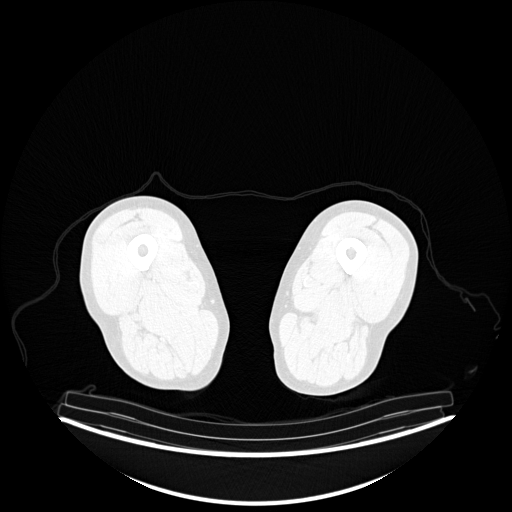
[im 165/329]
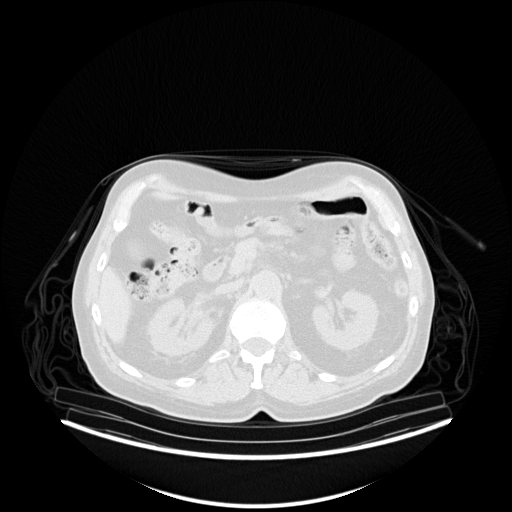
[im 329/329  brain]
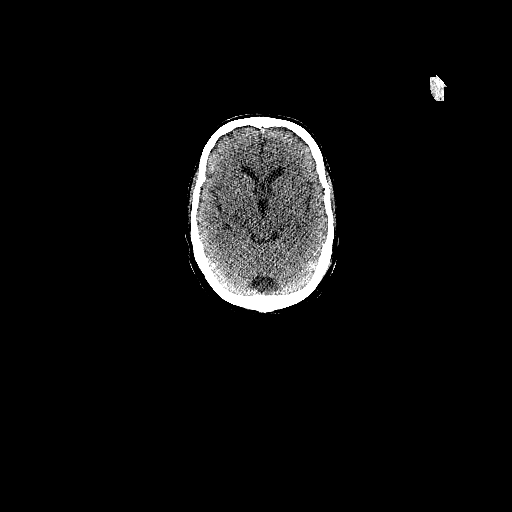

[Series 4: pet wb (ac) · axial · 5.0mm · 2.72mm/px · z∈[-290,+694]mm · 2 of 329 slices shown]
[im 1/329]
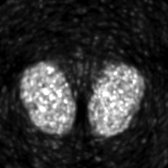
[im 329/329]
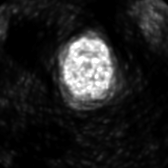

[Series 5: pet wb uncorrected (nac) · axial · 5.0mm · 4.07mm/px · z∈[-290,+694]mm · 3 of 329 slices shown]
[im 1/329]
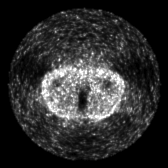
[im 165/329]
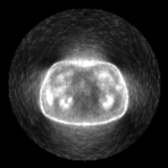
[im 329/329]
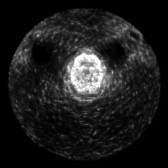

[Series 603: pet axial fused · 3 of 327 slices shown]
[im 1/327]
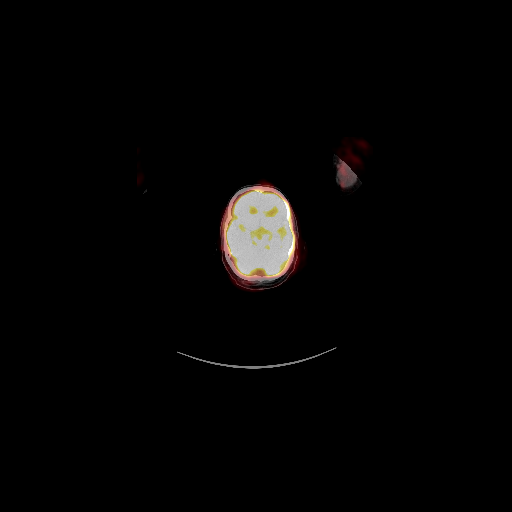
[im 109/327]
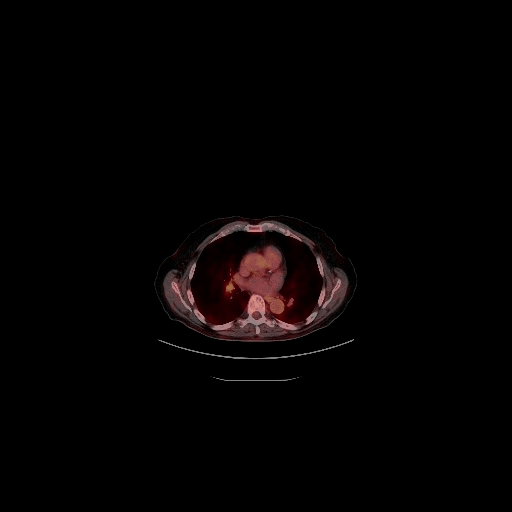
[im 218/327]
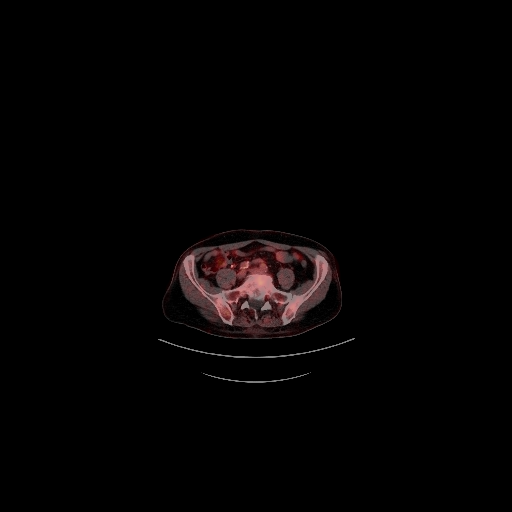

[Series 604: pet coronal fused · 1 of 107 slices shown]
[im 1/107]
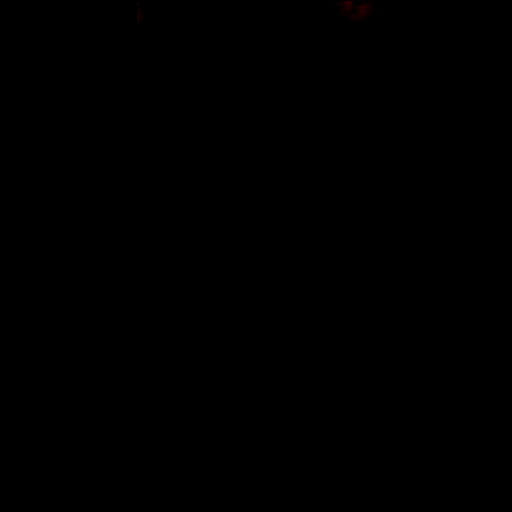

[Series 605: pet sagittal fused · 2 of 137 slices shown]
[im 1/137]
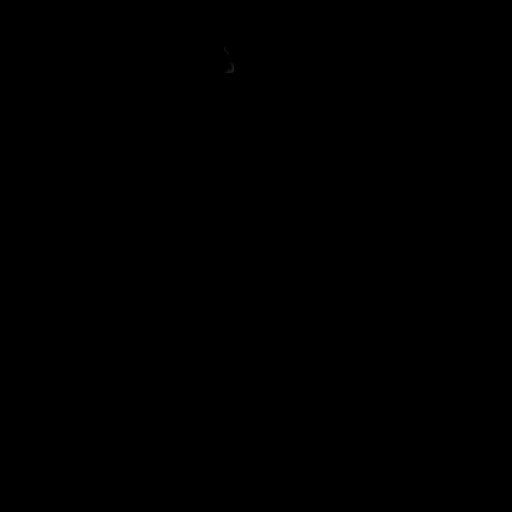
[im 137/137]
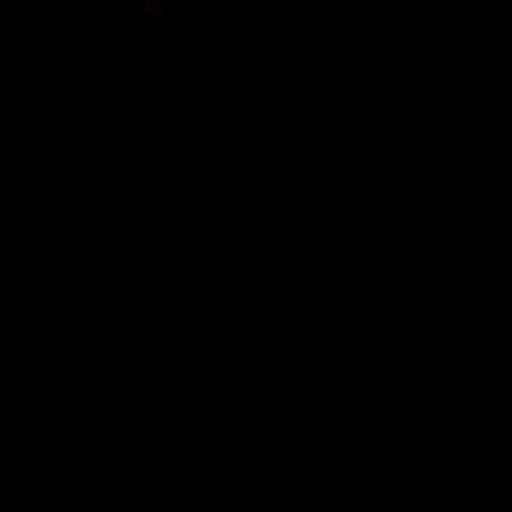

[Series 606: pet axial · 4 of 327 slices shown]
[im 1/327]
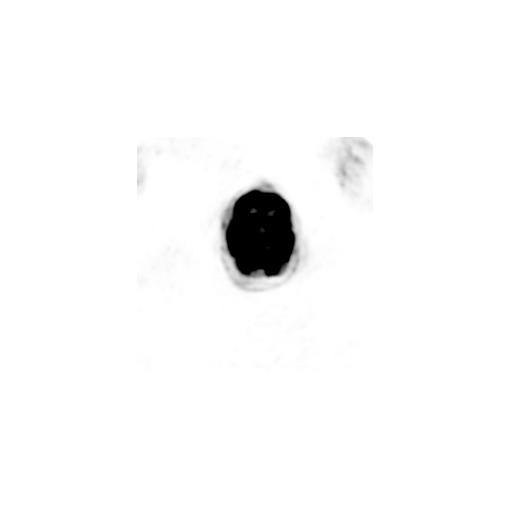
[im 109/327]
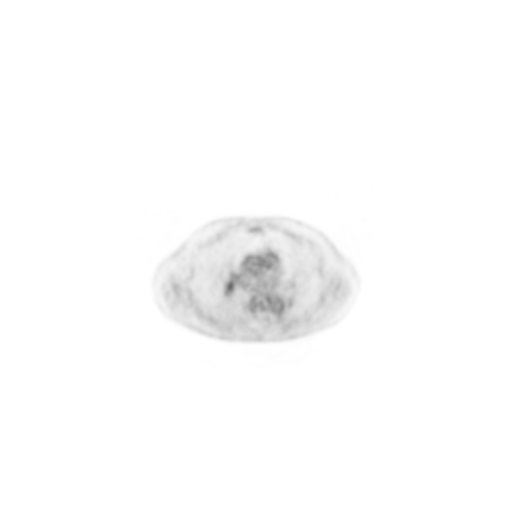
[im 218/327]
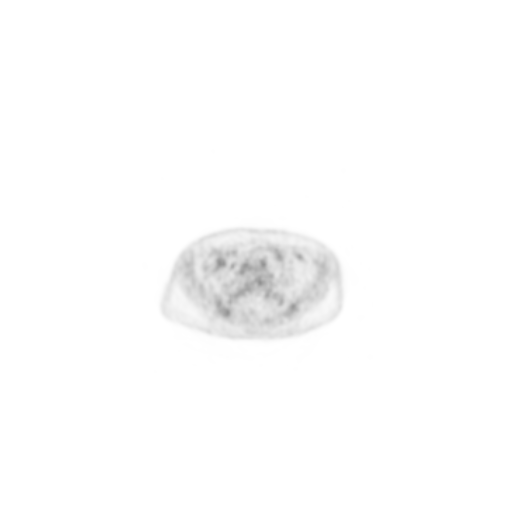
[im 327/327]
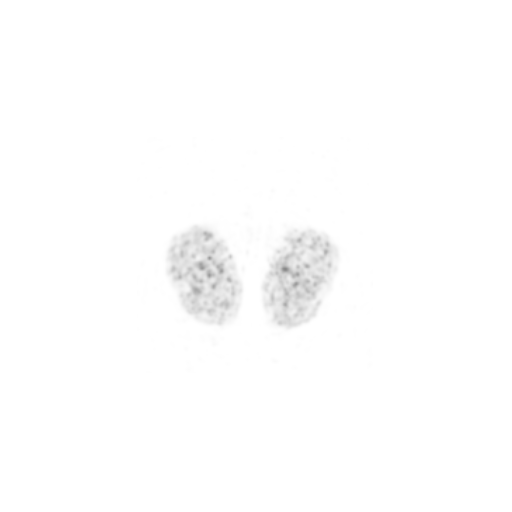

[Series 607: pet coronal · 1 of 137 slices shown]
[im 137/137]
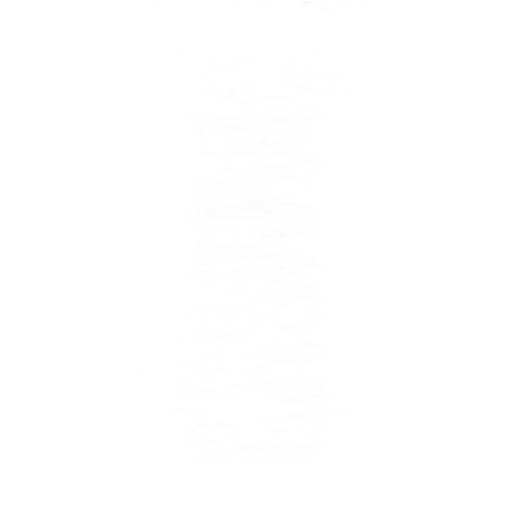

[Series 608: pet sagittal · 2 of 151 slices shown]
[im 1/151]
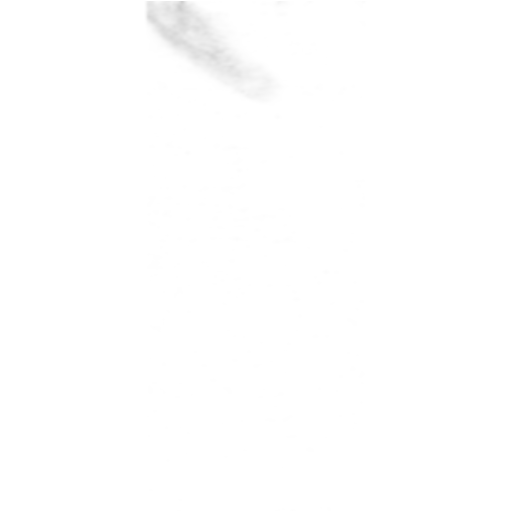
[im 151/151]
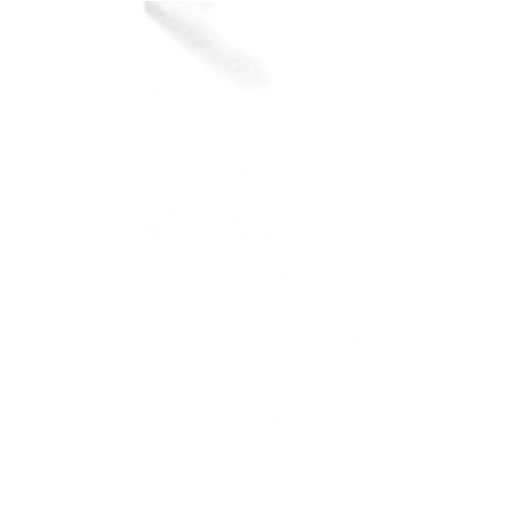

[Series 1064: results mm oncology reading · 1.0mm · 1.00mm/px · 1 of 4 slices shown]
[im 1/4]
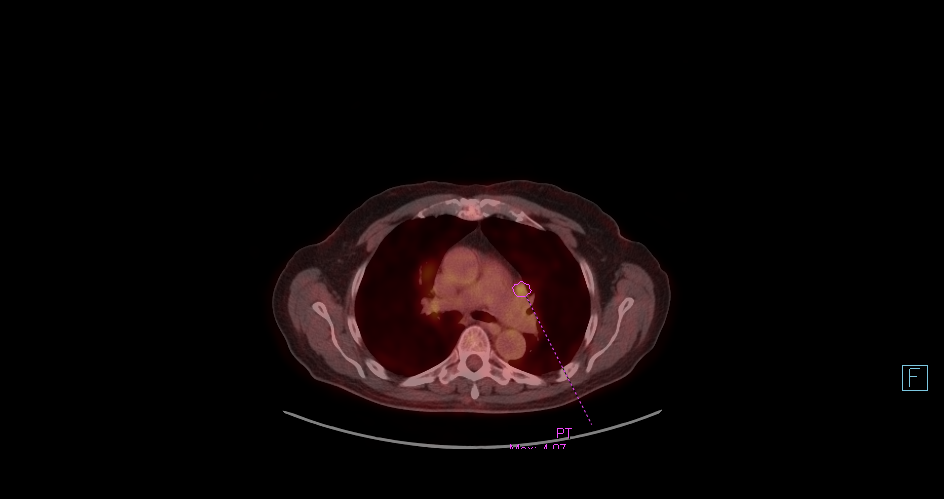

[22 of 25 positions shown; findings below may reference images not displayed]

FINDINGS: Mediastinal blood pool activity: SUV max

NECK: No hypermetabolic lymph nodes in the neck.

Incidental CT findings: None.

CHEST: Mildly hypermetabolic left hilar lymph nodes measure up to 10
mm (CT image 101) with an SUV max of 4.1. No hypermetabolic
mediastinal or axillary lymph nodes. A 1.3 x 1.5 cm nodule in the
medial right upper lobe (series 3, image 102) shows metabolism
around blood pool (SUV max 2.5).

Incidental CT findings: Atherosclerotic calcification of the
arterial vasculature, including coronary arteries. Heart is mildly
enlarged. No pericardial or pleural effusion.

ABDOMEN/PELVIS: No abnormal hypermetabolism in the liver, adrenal
glands, spleen or pancreas. Right obturator lymph node measures
cm (CT image 147) with an SUV max of 6.8. No additional
hypermetabolic lymph nodes.

Incidental CT findings: Atherosclerotic calcification of the
arterial vasculature without abdominal aortic aneurysm. Stones are
seen in the gallbladder. Double-J right ureteral stent is in place

SKELETON: No abnormal osseous hypermetabolism.

Incidental CT findings: None.
IMPRESSION: 1. Hypermetabolic right obturator lymph node is most indicative of
metastatic disease.
2. Mildly hypermetabolic left hilar lymph nodes are nonspecific.
Continued attention on follow-up exams is warranted.
3. Right upper lobe pulmonary nodule shows metabolism after just
above blood pool and is therefore indeterminate. Continued attention
on follow-up exams is warranted.
4. Aortic atherosclerosis ([id number]-170.0). Coronary artery
calcification.
5. Cholelithiasis.

## 2018-09-24 ENCOUNTER — Inpatient Hospital Stay (HOSPITAL_BASED_OUTPATIENT_CLINIC_OR_DEPARTMENT_OTHER): Payer: Medicare HMO | Admitting: Oncology

## 2018-09-24 ENCOUNTER — Inpatient Hospital Stay: Payer: Medicare HMO | Attending: Oncology | Admitting: *Deleted

## 2018-09-24 ENCOUNTER — Inpatient Hospital Stay (HOSPITAL_BASED_OUTPATIENT_CLINIC_OR_DEPARTMENT_OTHER): Payer: Medicare HMO | Admitting: Hospice and Palliative Medicine

## 2018-09-24 ENCOUNTER — Inpatient Hospital Stay: Payer: Medicare HMO

## 2018-09-24 ENCOUNTER — Encounter: Payer: Self-pay | Admitting: Oncology

## 2018-09-24 ENCOUNTER — Other Ambulatory Visit: Payer: Self-pay

## 2018-09-24 VITALS — BP 124/76 | HR 79 | Temp 98.0°F | Resp 18 | Wt 193.3 lb

## 2018-09-24 DIAGNOSIS — Z66 Do not resuscitate: Secondary | ICD-10-CM | POA: Diagnosis not present

## 2018-09-24 DIAGNOSIS — Z7189 Other specified counseling: Secondary | ICD-10-CM

## 2018-09-24 DIAGNOSIS — C661 Malignant neoplasm of right ureter: Secondary | ICD-10-CM | POA: Diagnosis not present

## 2018-09-24 DIAGNOSIS — Z8551 Personal history of malignant neoplasm of bladder: Secondary | ICD-10-CM

## 2018-09-24 DIAGNOSIS — R5383 Other fatigue: Secondary | ICD-10-CM

## 2018-09-24 DIAGNOSIS — C7801 Secondary malignant neoplasm of right lung: Secondary | ICD-10-CM

## 2018-09-24 DIAGNOSIS — Z95828 Presence of other vascular implants and grafts: Secondary | ICD-10-CM

## 2018-09-24 DIAGNOSIS — Z5112 Encounter for antineoplastic immunotherapy: Secondary | ICD-10-CM

## 2018-09-24 DIAGNOSIS — I10 Essential (primary) hypertension: Secondary | ICD-10-CM | POA: Diagnosis not present

## 2018-09-24 DIAGNOSIS — C669 Malignant neoplasm of unspecified ureter: Secondary | ICD-10-CM

## 2018-09-24 DIAGNOSIS — E785 Hyperlipidemia, unspecified: Secondary | ICD-10-CM

## 2018-09-24 DIAGNOSIS — Z8249 Family history of ischemic heart disease and other diseases of the circulatory system: Secondary | ICD-10-CM | POA: Insufficient documentation

## 2018-09-24 DIAGNOSIS — Z79899 Other long term (current) drug therapy: Secondary | ICD-10-CM | POA: Diagnosis not present

## 2018-09-24 DIAGNOSIS — Z515 Encounter for palliative care: Secondary | ICD-10-CM | POA: Diagnosis present

## 2018-09-24 LAB — CBC WITH DIFFERENTIAL/PLATELET
Abs Immature Granulocytes: 0.01 10*3/uL (ref 0.00–0.07)
Basophils Absolute: 0 10*3/uL (ref 0.0–0.1)
Basophils Relative: 1 %
Eosinophils Absolute: 0.2 10*3/uL (ref 0.0–0.5)
Eosinophils Relative: 4 %
HCT: 38.2 % — ABNORMAL LOW (ref 39.0–52.0)
Hemoglobin: 12.9 g/dL — ABNORMAL LOW (ref 13.0–17.0)
Immature Granulocytes: 0 %
Lymphocytes Relative: 26 %
Lymphs Abs: 1.4 10*3/uL (ref 0.7–4.0)
MCH: 28.9 pg (ref 26.0–34.0)
MCHC: 33.8 g/dL (ref 30.0–36.0)
MCV: 85.7 fL (ref 80.0–100.0)
Monocytes Absolute: 0.5 10*3/uL (ref 0.1–1.0)
Monocytes Relative: 10 %
Neutro Abs: 3.2 10*3/uL (ref 1.7–7.7)
Neutrophils Relative %: 59 %
Platelets: 227 10*3/uL (ref 150–400)
RBC: 4.46 MIL/uL (ref 4.22–5.81)
RDW: 13.3 % (ref 11.5–15.5)
WBC: 5.5 10*3/uL (ref 4.0–10.5)
nRBC: 0 % (ref 0.0–0.2)

## 2018-09-24 LAB — COMPREHENSIVE METABOLIC PANEL
ALT: 20 U/L (ref 0–44)
AST: 31 U/L (ref 15–41)
Albumin: 3.6 g/dL (ref 3.5–5.0)
Alkaline Phosphatase: 57 U/L (ref 38–126)
Anion gap: 8 (ref 5–15)
BUN: 18 mg/dL (ref 8–23)
CO2: 28 mmol/L (ref 22–32)
Calcium: 8.9 mg/dL (ref 8.9–10.3)
Chloride: 100 mmol/L (ref 98–111)
Creatinine, Ser: 1.09 mg/dL (ref 0.61–1.24)
GFR calc Af Amer: >60 mL/min (ref >60)
GFR calc non Af Amer: >60 mL/min (ref >60)
Glucose, Bld: 121 mg/dL — ABNORMAL HIGH (ref 70–99)
Potassium: 4 mmol/L (ref 3.5–5.1)
Sodium: 136 mmol/L (ref 135–145)
Total Bilirubin: 0.7 mg/dL (ref 0.3–1.2)
Total Protein: 6.9 g/dL (ref 6.5–8.1)

## 2018-09-24 MED ORDER — SODIUM CHLORIDE 0.9 % IV SOLN
Freq: Once | INTRAVENOUS | Status: AC
  Administered 2018-09-24: 10:00:00 via INTRAVENOUS
  Filled 2018-09-24: qty 250

## 2018-09-24 MED ORDER — SODIUM CHLORIDE 0.9% FLUSH
10.0000 mL | Freq: Once | INTRAVENOUS | Status: AC
  Administered 2018-09-24: 10 mL via INTRAVENOUS
  Filled 2018-09-24: qty 10

## 2018-09-24 MED ORDER — SODIUM CHLORIDE 0.9 % IV SOLN
200.0000 mg | Freq: Once | INTRAVENOUS | Status: AC
  Administered 2018-09-24: 200 mg via INTRAVENOUS
  Filled 2018-09-24: qty 8

## 2018-09-24 MED ORDER — HEPARIN SOD (PORK) LOCK FLUSH 100 UNIT/ML IV SOLN
500.0000 [IU] | Freq: Once | INTRAVENOUS | Status: AC | PRN
  Administered 2018-09-24: 500 [IU]
  Filled 2018-09-24: qty 5

## 2018-09-24 NOTE — Progress Notes (Signed)
Patient is here for follow up, he mentions skin itching but not very bothersome, would like to know results of recent scan.

## 2018-09-24 NOTE — Progress Notes (Signed)
Hematology/Oncology Consult note [facility]  Telephone:([phone] [phone]  Patient Care Team: Idelle Crouch, MD as PCP - General (Internal Medicine)   Name of the patient: Anthony Gonzales  [id number]  04-Dec-1931   Date of visit: 09/24/18  Diagnosis- Metastatic urothelial carcinoma with possible mets to the obturator node. Indeterminate hilar lymph nodes and LUL lung nodule  Chief complaint/ Reason for visit-on treatment assessment prior to cycle 5 of Keytruda  Heme/Onc history: patient is a 85 year old male with past medical history significant for hypertension and long-standing history of superficial bladder cancer for which he sees [name]. He has undergone TURBT as well as ureteroscopy since 2017 along with mitomycin as well in the past. Most recently he underwent CT abdomen on 08/06/2017 which showed a soft tissue mass in the right renal pelvis concerning for upper tract urothelial neoplasm. Soft tissue fullness at the right ureterovesical junction with proximal right hydroureteronephrosis. Several millimeter attenuation lesion in the pancreatic head.  He underwent diagnostic ureteroscopy and was found to have 2 high-grade lesions within his right kidney. There was a nodular high-grade appearing lesion in the right anterior renal pelvis. There was also a second ureteral tumor fungating from the right ureteral orifice extending into the distal ureter also consistent with high-grade invasive urothelial carcinoma. Muscle invasion could not be assessed. He also underwent a CT chest which showed a rounded nodule in the right upper lobe measuring 14 mm concerning for metastases. 2 other 3 mm lesions were also noted in the left upper lobe likely benign  Plan initially was neoadjuvant chemotherapy followed by possible surgery but given the presence of lung lesion patient has been referred to oncology for the same.  PET/CT on 09/21/17  showed:IMPRESSION: 1. Hypermetabolic right obturator lymph node is most indicative of metastatic disease. 2. Mildly hypermetabolic left hilar lymph nodes are nonspecific. Continued attention on follow-up exams is warranted. 3. Right upper lobe pulmonary nodule shows metabolism after just above blood pool and is therefore indeterminate. Continued attention on follow-up exams is warranted. 4. Aortic atherosclerosis (ICD10-170.0). Coronary artery calcification. 5. Cholelithiasis  Carboplatin/ gemzar1 week on and one-week off as patient could not tolerate 2-week on and one week off regimen. Cycle 1 started on 09/22/2017 Disease progression in March 2020. Switched to second [employer]   Interval history-urinary symptoms including penile pain and urinary hesitancy have resolved.  Other than mild fatigue patient reports no new complaints at this time  ECOG PS- 1 Pain scale- 0   Review of systems- Review of Systems  Constitutional: Positive for malaise/fatigue. Negative for chills, fever and weight loss.  HENT: Negative for congestion, ear discharge and nosebleeds.   Eyes: Negative for blurred vision.  Respiratory: Negative for cough, hemoptysis, sputum production, shortness of breath and wheezing.   Cardiovascular: Negative for chest pain, palpitations, orthopnea and claudication.  Gastrointestinal: Negative for abdominal pain, blood in stool, constipation, diarrhea, heartburn, melena, nausea and vomiting.  Genitourinary: Negative for dysuria, flank pain, frequency, hematuria and urgency.  Musculoskeletal: Negative for back pain, joint pain and myalgias.  Skin: Negative for rash.  Neurological: Negative for dizziness, tingling, focal weakness, seizures, weakness and headaches.  Endo/Heme/Allergies: Does not bruise/bleed easily.  Psychiatric/Behavioral: Negative for depression and suicidal ideas. The patient does not have insomnia.       Allergies  Allergen Reactions    Demerol [Meperidine] Nausea And Vomiting   Lipitor [Atorvastatin] Swelling   Sulfa Antibiotics Nausea And Vomiting and Rash  Past Medical History:  Diagnosis Date   Arthritis    Benign fibroma of prostate 08/23/2013   BPH (benign prostatic hyperplasia)    Calculus of kidney 08/23/2013   Glaucoma    no drops in 3 mo pressure good, pt denies glaucoma, eye pressure has been measuring alright.   History of kidney stones    HLD (hyperlipidemia)    HOH (hard of hearing)    Left Hearing Aid   HTN (hypertension) 12/26/2014   Hypertension    Hyponatremia 12/26/2014   Migraines    history of migraines when he was younger.   Restless leg syndrome    Sinus drainage    Skin cancer    Urothelial cancer ([place])    chemo tx's.   UTI (lower urinary tract infection) 12/26/2014   Vertigo      Past Surgical History:  Procedure Laterality Date   COLONOSCOPY     CYSTOSCOPY W/ RETROGRADES Right 01/30/2015   Procedure: CYSTOSCOPY WITH RETROGRADE PYELOGRAM;  Surgeon: [name], MD;  Location: [facility];  Service: Urology;  Laterality: Right;   CYSTOSCOPY W/ RETROGRADES Bilateral 02/26/2016   Procedure: CYSTOSCOPY WITH RETROGRADE PYELOGRAM;  Surgeon: [name], MD;  Location: [facility];  Service: Urology;  Laterality: Bilateral;   CYSTOSCOPY W/ URETERAL STENT PLACEMENT Right 08/20/2015   Procedure: CYSTOSCOPY WITH RETROGRADE PYELOGRAM/POSSIBLE URETERAL STENT PLACEMENT/BLADDER BIOPSY;  Surgeon: [name], MD;  Location: [facility];  Service: Urology;  Laterality: Right;   CYSTOSCOPY W/ URETERAL STENT PLACEMENT Right 09/12/2015   Procedure: CYSTOSCOPY WITH STENT REPLACEMENT;  Surgeon: [name], MD;  Location: [facility];  Service: Urology;  Laterality: Right;   CYSTOSCOPY WITH BIOPSY Right 09/12/2015   Procedure: CYSTOSCOPY WITH BLADDER AND URETERAL BIOPSY;  Surgeon: [name], MD;  Location: [facility];  Service: Urology;  Laterality: Right;   CYSTOSCOPY WITH  STENT PLACEMENT Right 01/30/2015   Procedure: CYSTOSCOPY WITH STENT PLACEMENT;  Surgeon: [name], MD;  Location: [facility];  Service: Urology;  Laterality: Right;   CYSTOSCOPY WITH STENT PLACEMENT Right 12/28/2017   Procedure: CYSTOSCOPY WITH STENT Exchange;  Surgeon: [name], MD;  Location: [facility];  Service: Urology;  Laterality: Right;   CYSTOSCOPY/URETEROSCOPY/HOLMIUM LASER/STENT PLACEMENT Right 08/19/2017   Procedure: CYSTOSCOPY/URETEROSCOPY/HOLMIUM LASER/STENT PLACEMENT;  Surgeon: [name], MD;  Location: [facility];  Service: Urology;  Laterality: Right;   EYE SURGERY Bilateral    Cataract Extraction with IOL   goiter removal     HOLMIUM LASER APPLICATION N/A 5/63/1497   Procedure:  HOLMIUM LASER APPLICATION;  Surgeon: [name], MD;  Location: [facility];  Service: Urology;  Laterality: N/A;   PORTA CATH INSERTION N/A 09/23/2017   Procedure: PORTA CATH INSERTION;  Surgeon: Algernon Huxley, MD;  Location: [facility];  Service: Cardiovascular;  Laterality: N/A;   SPERMATOCELECTOMY     TONSILLECTOMY     TRANSURETHRAL RESECTION OF BLADDER TUMOR N/A 02/26/2016   Procedure: TRANSURETHRAL RESECTION OF BLADDER TUMOR (TURBT);  Surgeon: [name], MD;  Location: [facility];  Service: Urology;  Laterality: N/A;   TRANSURETHRAL RESECTION OF BLADDER TUMOR WITH MITOMYCIN-C N/A 09/12/2015   Procedure: TRANSURETHRAL RESECTION OF BLADDER TUMOR ;  Surgeon: [name], MD;  Location: [facility];  Service: Urology;  Laterality: N/A;   TRANSURETHRAL RESECTION OF BLADDER TUMOR WITH MITOMYCIN-C N/A 03/24/2016   Procedure: TRANSURETHRAL RESECTION OF BLADDER TUMOR WITH MITOMYCIN-C  (SMALL);  Surgeon: [name], MD;  Location: [facility];  Service: Urology;  Laterality: N/A;   URETERAL BIOPSY Right 08/19/2017  Procedure: Renal Mass BIOPSY;  Surgeon: [name], MD;  Location: [facility];  Service: Urology;  Laterality: Right;   URETEROSCOPY Right 01/30/2015    Procedure: URETEROSCOPY/ WITH BIOPSY AND CYTOLOGY BRUSHING;  Surgeon: [name], MD;  Location: [facility];  Service: Urology;  Laterality: Right;   URETEROSCOPY Right 08/20/2015   Procedure: URETEROSCOPY;  Surgeon: [name], MD;  Location: [facility];  Service: Urology;  Laterality: Right;   URETEROSCOPY Right 09/12/2015   Procedure: URETEROSCOPY;  Surgeon: [name], MD;  Location: [facility];  Service: Urology;  Laterality: Right;   URETEROSCOPY Right 02/26/2016   Procedure: URETEROSCOPY;  Surgeon: [name], MD;  Location: [facility];  Service: Urology;  Laterality: Right;    Social History   Socioeconomic History   Marital status: Married    Spouse name: Not on file   Number of children: Not on file   Years of education: Not on file   Highest education level: Not on file  Occupational History   Not on file  Social Needs   Financial resource strain: Not on file   Food insecurity    Worry: Not on file    Inability: Not on file   Transportation needs    Medical: Not on file    Non-medical: Not on file  Tobacco Use   Smoking status: Never Smoker   Smokeless tobacco: Never Used  Substance and Sexual Activity   Alcohol use: No    Alcohol/week: 0.0 standard drinks   Drug use: No   Sexual activity: Not on file  Lifestyle   Physical activity    Days per week: Not on file    Minutes per session: Not on file   Stress: Not on file  Relationships   Social connections    Talks on phone: Not on file    Gets together: Not on file    Attends religious service: Not on file    Active member of club or organization: Not on file    Attends meetings of clubs or organizations: Not on file    Relationship status: Not on file   Intimate partner violence    Fear of current or ex partner: Not on file    Emotionally abused: Not on file    Physically abused: Not on file    Forced sexual activity: Not on file  Other Topics Concern   Not on file  Social  History Narrative   Not on file    Family History  Problem Relation Age of Onset   Hypertension Mother    Hypertension Father    Prostate cancer Brother    Kidney disease Neg Hx    Kidney cancer Neg Hx    Bladder Cancer Neg Hx      Current Outpatient Medications:    acetaminophen (TYLENOL) 500 MG tablet, Take 1,000 mg by mouth daily as needed for moderate pain. , Disp: , Rfl:    amLODipine (NORVASC) 5 MG tablet, Take 5 mg by mouth daily. , Disp: , Rfl:    benazepril-hydrochlorthiazide (LOTENSIN HCT) 20-12.5 MG tablet, Take 1 tablet by mouth daily. , Disp: , Rfl:    diazepam (VALIUM) 5 MG tablet, TAKE 1 TABLET (5 MG TOTAL) BY MOUTH EVERY 12 (TWELVE) HOURS AS NEEDED FOR ANXIETY OR SLEEP, Disp: , Rfl:    docusate sodium (COLACE) 100 MG capsule, Take 100 mg by mouth 2 (two) times daily. , Disp: , Rfl:    fenofibrate micronized (LOFIBRA) 134 MG capsule, Take 134 mg by  mouth daily before breakfast. , Disp: , Rfl:    fluticasone (FLONASE) 50 MCG/ACT nasal spray, Place 2 sprays into both nostrils at bedtime., Disp: , Rfl:    HYDROcodone-acetaminophen (NORCO/VICODIN) 5-325 MG tablet, Take 1 tablet by mouth every 6 (six) hours as needed for severe pain., Disp: 30 tablet, Rfl: 0   hydrocortisone cream 1 %, Apply 1 application topically daily as needed for itching., Disp: , Rfl:    Multiple Vitamin (MULTIVITAMIN WITH MINERALS) TABS tablet, Take 1 tablet by mouth daily., Disp: , Rfl:    Potassium 99 MG TABS, Take 99 mg by mouth at bedtime., Disp: , Rfl:    Probiotic Product (PROBIOTIC-10 PO), Take by mouth every morning., Disp: , Rfl:    psyllium (METAMUCIL) 58.6 % packet, Take 1 packet by mouth daily. , Disp: , Rfl:    tamsulosin (FLOMAX) 0.4 MG CAPS capsule, Take 1 capsule (0.4 mg total) by mouth daily., Disp: 90 capsule, Rfl: 3 No current facility-administered medications for this visit.   Facility-Administered Medications Ordered in Other Visits:    heparin lock flush  100 unit/mL, 500 Units, Intracatheter, Once PRN, Sindy Guadeloupe, MD   pembrolizumab [facility]) 200 mg in sodium chloride 0.9 % 50 mL chemo infusion, 200 mg, Intravenous, Once, Sindy Guadeloupe, MD, Last Rate: 116 mL/hr at 09/24/18 1108, 200 mg at 09/24/18 1108  Physical exam:  Vitals:   09/24/18 0945  BP: 124/76  Pulse: 79  Resp: 18  Temp: 98 F (36.7 C)  Weight: 193 lb 4.8 oz (87.7 kg)   Physical Exam HENT:     Head: Normocephalic and atraumatic.  Eyes:     Pupils: Pupils are equal, round, and reactive to light.  Neck:     Musculoskeletal: Normal range of motion.  Cardiovascular:     Rate and Rhythm: Normal rate and regular rhythm.     Heart sounds: Normal heart sounds.  Pulmonary:     Effort: Pulmonary effort is normal.     Breath sounds: Normal breath sounds.  Abdominal:     General: Bowel sounds are normal.     Palpations: Abdomen is soft.  Skin:    General: Skin is warm and dry.  Neurological:     Mental Status: He is alert and oriented to person, place, and time.      CMP Latest Ref Rng & Units 09/24/2018  Glucose 70 - 99 mg/dL 121(H)  BUN 8 - 23 mg/dL 18  Creatinine 0.61 - 1.24 mg/dL 1.09  Sodium 135 - 145 mmol/L 136  Potassium 3.5 - 5.1 mmol/L 4.0  Chloride 98 - 111 mmol/L 100  CO2 22 - 32 mmol/L 28  Calcium 8.9 - 10.3 mg/dL 8.9  Total Protein 6.5 - 8.1 g/dL 6.9  Total Bilirubin 0.3 - 1.2 mg/dL 0.7  Alkaline Phos 38 - 126 U/L 57  AST 15 - 41 U/L 31  ALT 0 - 44 U/L 20   CBC Latest Ref Rng & Units 09/24/2018  WBC 4.0 - 10.5 K/uL 5.5  Hemoglobin 13.0 - 17.0 g/dL 12.9(L)  Hematocrit 39.0 - 52.0 % 38.2(L)  Platelets 150 - 400 K/uL 227    No images are attached to the encounter.  Ct Chest W Contrast  Result Date: 09/17/2018 CLINICAL DATA:  History of bladder cancer, now with right upper tract urothelial neoplasm and suspected right lung metastasis. EXAM: CT CHEST, ABDOMEN, AND PELVIS WITH CONTRAST TECHNIQUE: Multidetector CT imaging of the chest, abdomen  and pelvis was performed following the standard  protocol during bolus administration of intravenous contrast. CONTRAST:  124mL OMNIPAQUE IOHEXOL 300 MG/ML  SOLN COMPARISON:  06/02/2018. FINDINGS: CT CHEST FINDINGS Cardiovascular: The heart is normal in size. No pericardial effusion. No evidence of thoracic aortic aneurysm. Atherosclerotic calcifications of the aortic arch. Coronary atherosclerosis of the LAD. Right chest port terminates in the mid SVC. Mediastinum/Nodes: No suspicious mediastinal lymphadenopathy. 7 mm short axis right hilar node, new, although within normal limits. Visualized thyroid is unremarkable. Lungs/Pleura: 14 x 11 mm nodule in the medial right upper lobe (series 3/image 83), previously 13 x 11 mm, grossly unchanged. 5 mm subpleural nodule in the superior segment left lower lobe (series 3/image 91), previously 10 mm. Adjacent 4 mm subpleural nodule in the superior segment left lower lobe (series 3/image 90), previously 3 mm. Additional 2-3 mm left lower lobe nodules (series 3/images 111, 125, and 138), unchanged. 3 mm left upper lobe nodule (series 3/image 70), unchanged. 3 mm right lower lobe nodule (series 3/image 135), unchanged. Mild compressive atelectasis in the medial right lower lobe. No focal consolidation. No pleural effusion or pneumothorax. Musculoskeletal: Degenerative changes of the thoracic spine. CT ABDOMEN PELVIS FINDINGS Hepatobiliary: Liver is within normal limits. Cholelithiasis, without associated inflammatory changes. No intrahepatic or extrahepatic ductal dilatation. Pancreas: 2.0 cm fluid density lesion along the inferior aspect of the uncinate process (series 2/image 86), unchanged, favoring a benign pseudocyst or side branch IPMN. Spleen: Within normal limits. Adrenals/Urinary Tract: Adrenal glands are within normal limits. Bilateral renal cysts, measuring 4.3 cm in the right lower kidney (series 9/image 29) and 8 mm in the left upper kidney (series 9/image 19),  benign (Bosniak I). No enhancing renal lesions. Urothelial thickening in the right upper pole collecting system, corresponding to the patient's known upper tract disease, measuring approximately 1.5 x 3.2 cm (series 9/image 21). This is difficult to discretely measure and compare across multiple studies. Additional 13 mm enhancing urothelial lesion inferiorly in the right renal collecting system (series 9/image 25), also difficult to discretely measure and compare. A hematuria study was not performed, and as such, delays were not continued into the pelvis. However, additional filling defects are suspected in the proximal/mid ureter which were not definitely present on the prior (series 9/images 33 and 36), raising concern for additional sites of urothelial tumor. 4 mm hyperdense/enhancing lesion at the right ureteral orifice/UVJ (series 2/image 125) may reflect an additional site of tumor, poorly evaluated. Bladder is mildly thick-walled but underdistended. Stomach/Bowel: Stomach is within normal limits. No evidence of bowel obstruction. Normal appendix (series 2/image 99). Sigmoid diverticulosis, without evidence of diverticulitis. Vascular/Lymphatic: No evidence of abdominal aortic aneurysm. Atherosclerotic calcifications of the abdominal aorta and branch vessels. 17 mm short axis right obturator node (series 2/image 119), previously 13 mm when measured in a similar fashion. Reproductive: Prostate is grossly unremarkable. Other: No abdominopelvic ascites. Musculoskeletal: Degenerative changes of the lumbar spine. IMPRESSION: Two urothelial lesions in the right renal collecting system are similar but difficult to directly compare. Possible new filling defects in the proximal/mid right ureter, poorly evaluated on this non hematuria study. Additional possible 4 mm lesion at the right ureteral orifice/UVJ is also poorly evaluated. 17 mm short axis right obturator node, compatible with nodal metastasis, mildly  progressive. 5 mm left lower lobe nodule, previously 10 mm, favoring improving metastasis. Additional pulmonary nodules are grossly unchanged, including the dominant 14 mm right upper lobe nodule, indeterminate. 2.0 cm unilocular cystic lesion in the pancreatic uncinate process, favoring a benign pseudocyst or side branch IPMN,  of questionable clinical significance in this patient. Attention on follow-up is likely satisfactory. Electronically Signed   By: Julian Hy M.D.   On: 09/17/2018 22:18   Ct Abdomen Pelvis W Contrast  Result Date: 09/17/2018 CLINICAL DATA:  History of bladder cancer, now with right upper tract urothelial neoplasm and suspected right lung metastasis. EXAM: CT CHEST, ABDOMEN, AND PELVIS WITH CONTRAST TECHNIQUE: Multidetector CT imaging of the chest, abdomen and pelvis was performed following the standard protocol during bolus administration of intravenous contrast. CONTRAST:  141mL OMNIPAQUE IOHEXOL 300 MG/ML  SOLN COMPARISON:  06/02/2018. FINDINGS: CT CHEST FINDINGS Cardiovascular: The heart is normal in size. No pericardial effusion. No evidence of thoracic aortic aneurysm. Atherosclerotic calcifications of the aortic arch. Coronary atherosclerosis of the LAD. Right chest port terminates in the mid SVC. Mediastinum/Nodes: No suspicious mediastinal lymphadenopathy. 7 mm short axis right hilar node, new, although within normal limits. Visualized thyroid is unremarkable. Lungs/Pleura: 14 x 11 mm nodule in the medial right upper lobe (series 3/image 83), previously 13 x 11 mm, grossly unchanged. 5 mm subpleural nodule in the superior segment left lower lobe (series 3/image 91), previously 10 mm. Adjacent 4 mm subpleural nodule in the superior segment left lower lobe (series 3/image 90), previously 3 mm. Additional 2-3 mm left lower lobe nodules (series 3/images 111, 125, and 138), unchanged. 3 mm left upper lobe nodule (series 3/image 70), unchanged. 3 mm right lower lobe nodule  (series 3/image 135), unchanged. Mild compressive atelectasis in the medial right lower lobe. No focal consolidation. No pleural effusion or pneumothorax. Musculoskeletal: Degenerative changes of the thoracic spine. CT ABDOMEN PELVIS FINDINGS Hepatobiliary: Liver is within normal limits. Cholelithiasis, without associated inflammatory changes. No intrahepatic or extrahepatic ductal dilatation. Pancreas: 2.0 cm fluid density lesion along the inferior aspect of the uncinate process (series 2/image 86), unchanged, favoring a benign pseudocyst or side branch IPMN. Spleen: Within normal limits. Adrenals/Urinary Tract: Adrenal glands are within normal limits. Bilateral renal cysts, measuring 4.3 cm in the right lower kidney (series 9/image 29) and 8 mm in the left upper kidney (series 9/image 19), benign (Bosniak I). No enhancing renal lesions. Urothelial thickening in the right upper pole collecting system, corresponding to the patient's known upper tract disease, measuring approximately 1.5 x 3.2 cm (series 9/image 21). This is difficult to discretely measure and compare across multiple studies. Additional 13 mm enhancing urothelial lesion inferiorly in the right renal collecting system (series 9/image 25), also difficult to discretely measure and compare. A hematuria study was not performed, and as such, delays were not continued into the pelvis. However, additional filling defects are suspected in the proximal/mid ureter which were not definitely present on the prior (series 9/images 33 and 36), raising concern for additional sites of urothelial tumor. 4 mm hyperdense/enhancing lesion at the right ureteral orifice/UVJ (series 2/image 125) may reflect an additional site of tumor, poorly evaluated. Bladder is mildly thick-walled but underdistended. Stomach/Bowel: Stomach is within normal limits. No evidence of bowel obstruction. Normal appendix (series 2/image 99). Sigmoid diverticulosis, without evidence of  diverticulitis. Vascular/Lymphatic: No evidence of abdominal aortic aneurysm. Atherosclerotic calcifications of the abdominal aorta and branch vessels. 17 mm short axis right obturator node (series 2/image 119), previously 13 mm when measured in a similar fashion. Reproductive: Prostate is grossly unremarkable. Other: No abdominopelvic ascites. Musculoskeletal: Degenerative changes of the lumbar spine. IMPRESSION: Two urothelial lesions in the right renal collecting system are similar but difficult to directly compare. Possible new filling defects in the proximal/mid right ureter,  poorly evaluated on this non hematuria study. Additional possible 4 mm lesion at the right ureteral orifice/UVJ is also poorly evaluated. 17 mm short axis right obturator node, compatible with nodal metastasis, mildly progressive. 5 mm left lower lobe nodule, previously 10 mm, favoring improving metastasis. Additional pulmonary nodules are grossly unchanged, including the dominant 14 mm right upper lobe nodule, indeterminate. 2.0 cm unilocular cystic lesion in the pancreatic uncinate process, favoring a benign pseudocyst or side branch IPMN, of questionable clinical significance in this patient. Attention on follow-up is likely satisfactory. Electronically Signed   By: Julian Hy M.D.   On: 09/17/2018 22:18   US Renal  Result Date: 09/09/2018 CLINICAL DATA:  Uroepithelial neoplasm. EXAM: RENAL / URINARY TRACT ULTRASOUND COMPLETE COMPARISON:  CT scan 06/02/2018 FINDINGS: Right Kidney: Renal measurements: 10.7 x 4.6 x 5.8 cm = volume: 150.0 mL. Normal renal cortical thickness and echogenicity for age. Stable lower pole cyst measuring 4.6 x 4.3 x 4.4 cm. Ill-defined soft tissue density in the renal hilum correlating with the abnormality on the CT examination. Left Kidney: Renal measurements: 12.1 x 5.6 x 4.9 center = volume: 171.8 mL. Normal renal cortical thickness for age and normal echogenicity. No hydronephrosis. Bladder:  Appears normal for degree of bladder distention. Bilateral ureteral jets are noted. Mild prostate gland enlargement. Suspect TURP defect. IMPRESSION: 1. Stable lower pole right renal cyst. 2. Ill-defined area of soft tissue echogenicity in the renal pelvis likely reflecting the patient's transitional cell neoplasm. No hydronephrosis. 3. Normal left kidney. Electronically Signed   By: Marijo Sanes M.D.   On: 09/09/2018 16:59     Assessment and plan- Patient is a 85 y.o. 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   He is here for on treatment assessment prior to cycle 5 of Keytruda  Counts okay to proceed with cycle 5 of Keytruda today.  I have reviewed CT chest abdomen and pelvis images independently and discussed findings with the patient.  Right obturator node has mildly increased from 13 to 17 mm.  Primary lesion in the urinary tract as well as the lung nodules are overall stable.  I therefore plan to continue Keytruda for another 3 months before getting another scan.  Overall he is tolerating Keytruda well without any significant side effects.  I will see him back in 3 weeks time with labs: CBC with differential, CMP for cycle 6.  I have discussed CT results with the patient's wife over the phone as well.   Visit Diagnosis 1. 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 Encounter for antineoplastic immunotherapy      Dr. Randa Evens, MD, MPH [facility] at [facility] [phone] 09/24/2018 11:21 AM

## 2018-09-24 NOTE — Progress Notes (Addendum)
[place]  Telephone:([phone] [phone]   Name: Anthony Gonzales Date: 09/24/2018 MRN: [id number]  DOB: 1931/06/16  Patient Care Team: Idelle Crouch, MD as PCP - General (Internal Medicine)    REASON FOR CONSULTATION: Palliative Care consult requested for this 85 y.o. male with multiple medical problems including metastatic urothelial carcinoma with possible mets to the obturator node, indeterminate hilar lymph nodes, and left upper lobe lung nodule.  Patient is currently on treatment with second line Keytruda after not being able to tolerate carboplatinum/Gemzar.  He is status post TURBT.  He has had severe penile pain but is markedly improved following removal of ureteral stent.  He was referred to palliative care to help address goals.   SOCIAL HISTORY:     reports that he has never smoked. He has never used smokeless tobacco. He reports that he does not drink alcohol or use drugs.   Patient is married and lives at home with his wife of 72 years.  He has no children.  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  ADVANCE DIRECTIVES:  On file  CODE STATUS: DNR (MOST form completed on 09/24/18)  PAST MEDICAL HISTORY: Past Medical History:  Diagnosis Date   Arthritis    Benign fibroma of prostate 08/23/2013   BPH (benign prostatic hyperplasia)    Calculus of kidney 08/23/2013   Glaucoma    no drops in 3 mo pressure good, pt denies glaucoma, eye pressure has been measuring alright.   History of kidney stones    HLD (hyperlipidemia)    HOH (hard of hearing)    Left Hearing Aid   HTN (hypertension) 12/26/2014   Hypertension    Hyponatremia 12/26/2014   Migraines    history of migraines when he was younger.   Restless leg syndrome    Sinus drainage    Skin cancer    Urothelial cancer ([place])    chemo tx's.   UTI (lower urinary tract infection) 12/26/2014   Vertigo      PAST SURGICAL HISTORY:  Past Surgical History:  Procedure Laterality Date   COLONOSCOPY     CYSTOSCOPY W/ RETROGRADES Right 01/30/2015   Procedure: CYSTOSCOPY WITH RETROGRADE PYELOGRAM;  Surgeon: [name], MD;  Location: [facility];  Service: Urology;  Laterality: Right;   CYSTOSCOPY W/ RETROGRADES Bilateral 02/26/2016   Procedure: CYSTOSCOPY WITH RETROGRADE PYELOGRAM;  Surgeon: [name], MD;  Location: [facility];  Service: Urology;  Laterality: Bilateral;   CYSTOSCOPY W/ URETERAL STENT PLACEMENT Right 08/20/2015   Procedure: CYSTOSCOPY WITH RETROGRADE PYELOGRAM/POSSIBLE URETERAL STENT PLACEMENT/BLADDER BIOPSY;  Surgeon: [name], MD;  Location: [facility];  Service: Urology;  Laterality: Right;   CYSTOSCOPY W/ URETERAL STENT PLACEMENT Right 09/12/2015   Procedure: CYSTOSCOPY WITH STENT REPLACEMENT;  Surgeon: [name], MD;  Location: [facility];  Service: Urology;  Laterality: Right;   CYSTOSCOPY WITH BIOPSY Right 09/12/2015   Procedure: CYSTOSCOPY WITH BLADDER AND URETERAL BIOPSY;  Surgeon: [name], MD;  Location: [facility];  Service: Urology;  Laterality: Right;   CYSTOSCOPY WITH STENT PLACEMENT Right 01/30/2015   Procedure: CYSTOSCOPY WITH STENT PLACEMENT;  Surgeon: [name], MD;  Location: [facility];  Service: Urology;  Laterality: Right;   CYSTOSCOPY WITH STENT PLACEMENT Right 12/28/2017   Procedure: CYSTOSCOPY WITH STENT Exchange;  Surgeon: [name], MD;  Location: [facility];  Service: Urology;  Laterality: Right;   CYSTOSCOPY/URETEROSCOPY/HOLMIUM LASER/STENT PLACEMENT Right 08/19/2017   Procedure: CYSTOSCOPY/URETEROSCOPY/HOLMIUM  CYSTOSCOPY/URETEROSCOPY/HOLMIUM LASER/STENT PLACEMENT;  Surgeon: [name], MD;  Location: [facility];  Service: Urology;  Laterality: Right;   EYE SURGERY Bilateral    Cataract Extraction with IOL   goiter removal     HOLMIUM LASER APPLICATION N/A 8/41/6606   Procedure:  HOLMIUM LASER APPLICATION;  Surgeon: [name], MD;  Location: [facility];  Service: Urology;  Laterality: N/A;   PORTA CATH INSERTION N/A 09/23/2017   Procedure: PORTA CATH INSERTION;  Surgeon: Algernon Huxley, MD;  Location: [facility];  Service: Cardiovascular;  Laterality: N/A;   SPERMATOCELECTOMY     TONSILLECTOMY     TRANSURETHRAL RESECTION OF BLADDER TUMOR N/A 02/26/2016   Procedure: TRANSURETHRAL RESECTION OF BLADDER TUMOR (TURBT);  Surgeon: [name], MD;  Location: [facility];  Service: Urology;  Laterality: N/A;   TRANSURETHRAL RESECTION OF BLADDER TUMOR WITH MITOMYCIN-C N/A 09/12/2015   Procedure: TRANSURETHRAL RESECTION OF BLADDER TUMOR ;  Surgeon: [name], MD;  Location: [facility];  Service: Urology;  Laterality: N/A;   TRANSURETHRAL RESECTION OF BLADDER TUMOR WITH MITOMYCIN-C N/A 03/24/2016   Procedure: TRANSURETHRAL RESECTION OF BLADDER TUMOR WITH MITOMYCIN-C  (SMALL);  Surgeon: [name], MD;  Location: [facility];  Service: Urology;  Laterality: N/A;   URETERAL BIOPSY Right 08/19/2017   Procedure: Renal Mass BIOPSY;  Surgeon: [name], MD;  Location: [facility];  Service: Urology;  Laterality: Right;   URETEROSCOPY Right 01/30/2015   Procedure: URETEROSCOPY/ WITH BIOPSY AND CYTOLOGY BRUSHING;  Surgeon: [name], MD;  Location: [facility];  Service: Urology;  Laterality: Right;   URETEROSCOPY Right 08/20/2015   Procedure: URETEROSCOPY;  Surgeon: [name], MD;  Location: [facility];  Service: Urology;  Laterality: Right;   URETEROSCOPY Right 09/12/2015   Procedure: URETEROSCOPY;  Surgeon: [name], MD;  Location: [facility];  Service: Urology;  Laterality: Right;   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[place])  09/15/2017 Cancer Staging   Staging form: Renal Pelvis and Ureter, AJCC 8th Edition - Clinical stage  from 09/15/2017: Stage IV (cT2, cN0, cM1) - Signed by Sindy Guadeloupe, MD on 09/17/2017   09/17/2017 Initial Diagnosis   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[place])   09/22/2017 - 06/07/2018 Chemotherapy   The patient had palonosetron (ALOXI) injection 0.25 mg, 0.25 mg, Intravenous,  Once, 9 of 10 cycles Administration: 0.25 mg (09/22/2017), 0.25 mg (10/06/2017), 0.25 mg (10/20/2017), 0.25 mg (11/03/2017), 0.25 mg (11/17/2017), 0.25 mg (12/01/2017), 0.25 mg (12/15/2017), 0.25 mg (12/29/2017), 0.25 mg (01/12/2018), 0.25 mg (01/26/2018), 0.25 mg (02/09/2018), 0.25 mg (02/23/2018), 0.25 mg (03/16/2018), 0.25 mg (04/13/2018), 0.25 mg (05/11/2018) pegfilgrastim-cbqv (UDENYCA) injection 6 mg, 6 mg, Subcutaneous, Once, 5 of 6 cycles Administration: 6 mg (01/13/2018), 6 mg (02/10/2018), 6 mg (03/17/2018), 6 mg (04/15/2018), 6 mg (05/12/2018), 6 mg (05/26/2018) CARBOplatin (PARAPLATIN) 160 mg in sodium chloride 0.9 % 250 mL chemo infusion, 160 mg (100 % of original dose 163.2 mg), Intravenous,  Once, 9 of 10 cycles Dose modification:   (original dose 163.2 mg, Cycle 1) Administration: 160 mg (09/22/2017), 170 mg (10/06/2017), 170 mg (10/20/2017), 170 mg (11/17/2017), 160 mg (12/01/2017), 170 mg (11/03/2017), 160 mg (12/15/2017), 160 mg (12/29/2017), 180 mg (01/12/2018), 180 mg (01/26/2018), 160 mg (02/09/2018), 160 mg (02/23/2018), 160 mg (03/16/2018), 160 mg (03/30/2018), 160 mg (04/13/2018), 180 mg (04/27/2018), 180 mg (05/11/2018), 160 mg (  gemcitabine (GEMZAR) 1,600 mg in sodium chloride 0.9 % 250 mL chemo infusion, 1,672 mg (100 % of original dose 800 mg/m2), Intravenous,  Once, 9 of 10 cycles Dose modification: 800 mg/m2 (original dose 800 mg/m2, Cycle 1, Reason: Patient Age) Administration: 1,600 mg (09/22/2017), 1,600 mg (10/06/2017), 1,600 mg (10/20/2017), 1,600 mg (11/03/2017), 1,600 mg (11/17/2017), 1,600 mg (12/01/2017), 1,600 mg (12/15/2017), 1,600 mg (12/29/2017), 1,600 mg (01/12/2018), 1,600 mg (01/26/2018), 1,600 mg (02/09/2018), 1,600 mg  (02/23/2018), 1,600 mg (03/16/2018), 1,600 mg (03/30/2018), 1,600 mg (04/13/2018), 1,600 mg (04/27/2018), 1,600 mg (05/11/2018), 1,600 mg (05/25/2018)  for chemotherapy treatment.    06/08/2018 -  Chemotherapy   The patient had pembrolizumab (KEYTRUDA) 200 mg in sodium chloride 0.9 % 50 mL chemo infusion, 200 mg, Intravenous, Once, 6 of 7 cycles Administration: 200 mg (06/08/2018), 200 mg (06/29/2018), 200 mg (07/20/2018), 200 mg (08/13/2018), 200 mg (09/03/2018)  for chemotherapy treatment.      ALLERGIES:  is allergic to demerol [meperidine]; lipitor [atorvastatin]; and sulfa antibiotics.  MEDICATIONS:  Current Outpatient Medications  Medication Sig Dispense Refill   acetaminophen (TYLENOL) 500 MG tablet Take 1,000 mg by mouth daily as needed for moderate pain.      amLODipine (NORVASC) 5 MG tablet Take 5 mg by mouth daily.      benazepril-hydrochlorthiazide (LOTENSIN HCT) 20-12.5 MG tablet Take 1 tablet by mouth daily.      diazepam (VALIUM) 5 MG tablet TAKE 1 TABLET (5 MG TOTAL) BY MOUTH EVERY 12 (TWELVE) HOURS AS NEEDED FOR ANXIETY OR SLEEP     docusate sodium (COLACE) 100 MG capsule Take 100 mg by mouth 2 (two) times daily.      fenofibrate micronized (LOFIBRA) 134 MG capsule Take 134 mg by mouth daily before breakfast.      fluticasone (FLONASE) 50 MCG/ACT nasal spray Place 2 sprays into both nostrils at bedtime.     HYDROcodone-acetaminophen (NORCO/VICODIN) 5-325 MG tablet Take 1 tablet by mouth every 6 (six) hours as needed for severe pain. 30 tablet 0   hydrocortisone cream 1 % Apply 1 application topically daily as needed for itching.     Multiple Vitamin (MULTIVITAMIN WITH MINERALS) TABS tablet Take 1 tablet by mouth daily.     Potassium 99 MG TABS Take 99 mg by mouth at bedtime.     Probiotic Product (PROBIOTIC-10 PO) Take by mouth every morning.     psyllium (METAMUCIL) 58.6 % packet Take 1 packet by mouth daily.      tamsulosin (FLOMAX) 0.4 MG CAPS capsule Take 1 capsule (0.4  mg total) by mouth daily. 90 capsule 3   No current facility-administered medications for this visit.     VITAL SIGNS: There were no vitals taken for this visit. There were no vitals filed for this visit.  Estimated body mass index is 26.22 kg/m as calculated from the following:   Height as of 08/26/18: 6' (1.829 m).   Weight as of an earlier encounter on 09/24/18: 193 lb 4.8 oz (87.7 kg).  LABS: CBC:    Component Value Date/Time   WBC 5.5 09/24/2018 0922   HGB 12.9 (L) 09/24/2018 0922   HGB 14.1 09/04/2017 0949   HCT 38.2 (L) 09/24/2018 0922   HCT 40.5 09/04/2017 0949   PLT 227 09/24/2018 0922   PLT 282 09/04/2017 0949   MCV 85.7 09/24/2018 0922   MCV 86 09/04/2017 0949   MCV 88 03/23/2013 0418   NEUTROABS 3.2 09/24/2018 0922   NEUTROABS 4.6 09/04/2017 0949   NEUTROABS 2.8  03/23/2013 0418   LYMPHSABS 1.4 09/24/2018 0922   LYMPHSABS 1.9 09/04/2017 0949   LYMPHSABS 1.2 03/23/2013 0418   MONOABS 0.5 09/24/2018 0922   MONOABS 0.8 03/23/2013 0418   EOSABS 0.2 09/24/2018 0922   EOSABS 0.3 09/04/2017 0949   EOSABS 0.1 03/23/2013 0418   BASOSABS 0.0 09/24/2018 0922   BASOSABS 0.0 09/04/2017 0949   BASOSABS 0.0 03/23/2013 0418   Comprehensive Metabolic Panel:    Component Value Date/Time   NA 136 09/24/2018 0922   NA 140 03/23/2013 0418   K 4.0 09/24/2018 0922   K 3.8 03/23/2013 0418   CL 100 09/24/2018 0922   CL 111 (H) 03/23/2013 0418   CO2 28 09/24/2018 0922   CO2 24 03/23/2013 0418   BUN 18 09/24/2018 0922   BUN 24 (H) 03/23/2013 0418   CREATININE 1.09 09/24/2018 0922   CREATININE 1.79 (H) 03/23/2013 0418   GLUCOSE 121 (H) 09/24/2018 0922   GLUCOSE 98 03/23/2013 0418   CALCIUM 8.9 09/24/2018 0922   CALCIUM 8.0 (L) 03/23/2013 0418   AST 31 09/24/2018 0922   AST 40 (H) 03/22/2013 0451   ALT 20 09/24/2018 0922   ALT 28 03/22/2013 0451   ALKPHOS 57 09/24/2018 0922   ALKPHOS 32 (L) 03/22/2013 0451   BILITOT 0.7 09/24/2018 0922   BILITOT 0.3 03/22/2013 0451    PROT 6.9 09/24/2018 0922   PROT 5.5 (L) 03/22/2013 0451   ALBUMIN 3.6 09/24/2018 0922   ALBUMIN 2.7 (L) 03/22/2013 0451    RADIOGRAPHIC STUDIES: Ct Chest W Contrast  Result Date: 09/17/2018 CLINICAL DATA:  History of bladder cancer, now with right upper tract urothelial neoplasm and suspected right lung metastasis. EXAM: CT CHEST, ABDOMEN, AND PELVIS WITH CONTRAST TECHNIQUE: Multidetector CT imaging of the chest, abdomen and pelvis was performed following the standard protocol during bolus administration of intravenous contrast. CONTRAST:  172m OMNIPAQUE IOHEXOL 300 MG/ML  SOLN COMPARISON:  06/02/2018. FINDINGS: CT CHEST FINDINGS Cardiovascular: The heart is normal in size. No pericardial effusion. No evidence of thoracic aortic aneurysm. Atherosclerotic calcifications of the aortic arch. Coronary atherosclerosis of the LAD. Right chest port terminates in the mid SVC. Mediastinum/Nodes: No suspicious mediastinal lymphadenopathy. 7 mm short axis right hilar node, new, although within normal limits. Visualized thyroid is unremarkable. Lungs/Pleura: 14 x 11 mm nodule in the medial right upper lobe (series 3/image 83), previously 13 x 11 mm, grossly unchanged. 5 mm subpleural nodule in the superior segment left lower lobe (series 3/image 91), previously 10 mm. Adjacent 4 mm subpleural nodule in the superior segment left lower lobe (series 3/image 90), previously 3 mm. Additional 2-3 mm left lower lobe nodules (series 3/images 111, 125, and 138), unchanged. 3 mm left upper lobe nodule (series 3/image 70), unchanged. 3 mm right lower lobe nodule (series 3/image 135), unchanged. Mild compressive atelectasis in the medial right lower lobe. No focal consolidation. No pleural effusion or pneumothorax. Musculoskeletal: Degenerative changes of the thoracic spine. CT ABDOMEN PELVIS FINDINGS Hepatobiliary: Liver is within normal limits. Cholelithiasis, without associated inflammatory changes. No intrahepatic or  extrahepatic ductal dilatation. Pancreas: 2.0 cm fluid density lesion along the inferior aspect of the uncinate process (series 2/image 86), unchanged, favoring a benign pseudocyst or side branch IPMN. Spleen: Within normal limits. Adrenals/Urinary Tract: Adrenal glands are within normal limits. Bilateral renal cysts, measuring 4.3 cm in the right lower kidney (series 9/image 29) and 8 mm in the left upper kidney (series 9/image 19), benign (Bosniak I). No enhancing renal lesions. Urothelial thickening  thickening in the right upper pole collecting system, corresponding to the patient's known upper tract disease, measuring approximately 1.5 x 3.2 cm (series 9/image 21). This is difficult to discretely measure and compare across multiple studies. Additional 13 mm enhancing urothelial lesion inferiorly in the right renal collecting system (series 9/image 25), also difficult to discretely measure and compare. A hematuria study was not performed, and as such, delays were not continued into the pelvis. However, additional filling defects are suspected in the proximal/mid ureter which were not definitely present on the prior (series 9/images 33 and 36), raising concern for additional sites of urothelial tumor. 4 mm hyperdense/enhancing lesion at the right ureteral orifice/UVJ (series 2/image 125) may reflect an additional site of tumor, poorly evaluated. Bladder is mildly thick-walled but underdistended. Stomach/Bowel: Stomach is within normal limits. No evidence of bowel obstruction. Normal appendix (series 2/image 99). Sigmoid diverticulosis, without evidence of diverticulitis. Vascular/Lymphatic: No evidence of abdominal aortic aneurysm. Atherosclerotic calcifications of the abdominal aorta and branch vessels. 17 mm short axis right obturator node (series 2/image 119), previously 13 mm when measured in a similar fashion. Reproductive: Prostate is grossly unremarkable. Other: No abdominopelvic ascites. Musculoskeletal: Degenerative  changes of the lumbar spine. IMPRESSION: Two urothelial lesions in the right renal collecting system are similar but difficult to directly compare. Possible new filling defects in the proximal/mid right ureter, poorly evaluated on this non hematuria study. Additional possible 4 mm lesion at the right ureteral orifice/UVJ is also poorly evaluated. 17 mm short axis right obturator node, compatible with nodal metastasis, mildly progressive. 5 mm left lower lobe nodule, previously 10 mm, favoring improving metastasis. Additional pulmonary nodules are grossly unchanged, including the dominant 14 mm right upper lobe nodule, indeterminate. 2.0 cm unilocular cystic lesion in the pancreatic uncinate process, favoring a benign pseudocyst or side branch IPMN, of questionable clinical significance in this patient. Attention on follow-up is likely satisfactory. Electronically Signed   By: Julian Hy M.D.   On: 09/17/2018 22:18   Ct Abdomen Pelvis W Contrast  Result Date: 09/17/2018 CLINICAL DATA:  History of bladder cancer, now with right upper tract urothelial neoplasm and suspected right lung metastasis. EXAM: CT CHEST, ABDOMEN, AND PELVIS WITH CONTRAST TECHNIQUE: Multidetector CT imaging of the chest, abdomen and pelvis was performed following the standard protocol during bolus administration of intravenous contrast. CONTRAST:  158m OMNIPAQUE IOHEXOL 300 MG/ML  SOLN COMPARISON:  06/02/2018. FINDINGS: CT CHEST FINDINGS Cardiovascular: The heart is normal in size. No pericardial effusion. No evidence of thoracic aortic aneurysm. Atherosclerotic calcifications of the aortic arch. Coronary atherosclerosis of the LAD. Right chest port terminates in the mid SVC. Mediastinum/Nodes: No suspicious mediastinal lymphadenopathy. 7 mm short axis right hilar node, new, although within normal limits. Visualized thyroid is unremarkable. Lungs/Pleura: 14 x 11 mm nodule in the medial right upper lobe (series 3/image 83), previously  13 x 11 mm, grossly unchanged. 5 mm subpleural nodule in the superior segment left lower lobe (series 3/image 91), previously 10 mm. Adjacent 4 mm subpleural nodule in the superior segment left lower lobe (series 3/image 90), previously 3 mm. Additional 2-3 mm left lower lobe nodules (series 3/images 111, 125, and 138), unchanged. 3 mm left upper lobe nodule (series 3/image 70), unchanged. 3 mm right lower lobe nodule (series 3/image 135), unchanged. Mild compressive atelectasis in the medial right lower lobe. No focal consolidation. No pleural effusion or pneumothorax. Musculoskeletal: Degenerative changes of the thoracic spine. CT ABDOMEN PELVIS FINDINGS Hepatobiliary: Liver is within normal limits. Cholelithiasis, without  inflammatory changes. No intrahepatic or extrahepatic ductal dilatation. Pancreas: 2.0 cm fluid density lesion along the inferior aspect of the uncinate process (series 2/image 86), unchanged, favoring a benign pseudocyst or side branch IPMN. Spleen: Within normal limits. Adrenals/Urinary Tract: Adrenal glands are within normal limits. Bilateral renal cysts, measuring 4.3 cm in the right lower kidney (series 9/image 29) and 8 mm in the left upper kidney (series 9/image 19), benign (Bosniak I). No enhancing renal lesions. Urothelial thickening in the right upper pole collecting system, corresponding to the patient's known upper tract disease, measuring approximately 1.5 x 3.2 cm (series 9/image 21). This is difficult to discretely measure and compare across multiple studies. Additional 13 mm enhancing urothelial lesion inferiorly in the right renal collecting system (series 9/image 25), also difficult to discretely measure and compare. A hematuria study was not performed, and as such, delays were not continued into the pelvis. However, additional filling defects are suspected in the proximal/mid ureter which were not definitely present on the prior (series 9/images 33 and 36), raising  concern for additional sites of urothelial tumor. 4 mm hyperdense/enhancing lesion at the right ureteral orifice/UVJ (series 2/image 125) may reflect an additional site of tumor, poorly evaluated. Bladder is mildly thick-walled but underdistended. Stomach/Bowel: Stomach is within normal limits. No evidence of bowel obstruction. Normal appendix (series 2/image 99). Sigmoid diverticulosis, without evidence of diverticulitis. Vascular/Lymphatic: No evidence of abdominal aortic aneurysm. Atherosclerotic calcifications of the abdominal aorta and branch vessels. 17 mm short axis right obturator node (series 2/image 119), previously 13 mm when measured in a similar fashion. Reproductive: Prostate is grossly unremarkable. Other: No abdominopelvic ascites. Musculoskeletal: Degenerative changes of the lumbar spine. IMPRESSION: Two urothelial lesions in the right renal collecting system are similar but difficult to directly compare. Possible new filling defects in the proximal/mid right ureter, poorly evaluated on this non hematuria study. Additional possible 4 mm lesion at the right ureteral orifice/UVJ is also poorly evaluated. 17 mm short axis right obturator node, compatible with nodal metastasis, mildly progressive. 5 mm left lower lobe nodule, previously 10 mm, favoring improving metastasis. Additional pulmonary nodules are grossly unchanged, including the dominant 14 mm right upper lobe nodule, indeterminate. 2.0 cm unilocular cystic lesion in the pancreatic uncinate process, favoring a benign pseudocyst or side branch IPMN, of questionable clinical significance in this patient. Attention on follow-up is likely satisfactory. Electronically Signed   By: Julian Hy M.D.   On: 09/17/2018 22:18   US Renal  Result Date: 09/09/2018 CLINICAL DATA:  Uroepithelial neoplasm. EXAM: RENAL / URINARY TRACT ULTRASOUND COMPLETE COMPARISON:  CT scan 06/02/2018 FINDINGS: Right Kidney: Renal measurements: 10.7 x 4.6 x 5.8 cm =  volume: 150.0 mL. Normal renal cortical thickness and echogenicity for age. Stable lower pole cyst measuring 4.6 x 4.3 x 4.4 cm. Ill-defined soft tissue density in the renal hilum correlating with the abnormality on the CT examination. Left Kidney: Renal measurements: 12.1 x 5.6 x 4.9 center = volume: 171.8 mL. Normal renal cortical thickness for age and normal echogenicity. No hydronephrosis. Bladder: Appears normal for degree of bladder distention. Bilateral ureteral jets are noted. Mild prostate gland enlargement. Suspect TURP defect. IMPRESSION: 1. Stable lower pole right renal cyst. 2. Ill-defined area of soft tissue echogenicity in the renal pelvis likely reflecting the patient's transitional cell neoplasm. No hydronephrosis. 3. Normal left kidney. Electronically Signed   By: Marijo Sanes M.D.   On: 09/09/2018 16:59    PERFORMANCE STATUS (ECOG) : 1 - Symptomatic but completely ambulatory  Review of Systems Unless otherwise noted, a complete review of systems is negative.  Physical Exam General: NAD, frail appearing, thin Pulmonary: unlabored Extremities: no edema, no joint deformities Skin: no rashes Neurological: Weakness but otherwise nonfocal  IMPRESSION: I met with patient today in the clinic.  Patient was seen in infusion.  Patient reports that he is doing reasonably well.  He denies any changes or concerns today.  He denies any distressing symptoms.  Patient says his emotional coping is good.  He reports good support with his wife at home.  He reports his oral intake is good.  Weight is stable at 193 pounds.  No concerns with medications.  He has planned follow-up with urology and patient is hopeful that he will not require stenting.  I completed a MOST form today. The patient and family outlined their wishes for the following treatment decisions:  Cardiopulmonary Resuscitation: Do Not Attempt Resuscitation (DNR/No CPR)  Medical Interventions: Limited Additional Interventions: Use  medical treatment, IV fluids and cardiac monitoring as indicated, DO NOT USE intubation or mechanical ventilation. May consider use of less invasive airway support such as BiPAP or CPAP. Also provide comfort measures. Transfer to the hospital if indicated. Avoid intensive care.   Antibiotics: Antibiotics if indicated  IV Fluids: IV fluids if indicated  Feeding Tube: No feeding tube     PLAN: -Continue current scope of treatment -MOST Form completed/DNAR -RTC in 4 weeks   Patient expressed understanding and was in agreement with this plan. He also understands that He can call the clinic at any time with any questions, concerns, or complaints.     Time Total: 30 minutes  Visit consisted of counseling and education dealing with the complex and emotionally intense issues of symptom management and palliative care in the setting of serious and potentially life-threatening illness.Greater than 50%  of this time was spent counseling and coordinating care related to the above assessment and plan.  Signed by: Altha Harm, PhD, NP-C [phone] (Work Cell)

## 2018-09-28 ENCOUNTER — Encounter: Payer: Self-pay | Admitting: Urology

## 2018-09-28 ENCOUNTER — Other Ambulatory Visit: Payer: Self-pay

## 2018-09-28 ENCOUNTER — Ambulatory Visit (INDEPENDENT_AMBULATORY_CARE_PROVIDER_SITE_OTHER): Payer: Medicare HMO | Admitting: Urology

## 2018-09-28 VITALS — BP 159/98 | HR 75 | Ht 72.0 in | Wt 192.0 lb

## 2018-09-28 DIAGNOSIS — R351 Nocturia: Secondary | ICD-10-CM

## 2018-09-28 DIAGNOSIS — N4889 Other specified disorders of penis: Secondary | ICD-10-CM

## 2018-09-28 DIAGNOSIS — C641 Malignant neoplasm of right kidney, except renal pelvis: Secondary | ICD-10-CM

## 2018-09-28 LAB — BLADDER SCAN AMB NON-IMAGING

## 2018-09-28 MED ORDER — MIRABEGRON ER 50 MG PO TB24: 50.0000 mg | ORAL_TABLET | Freq: Every day | ORAL | 11 refills | Status: DC

## 2018-09-28 NOTE — Progress Notes (Signed)
09/28/2018 11:32 AM   Anthony Gonzales [birth year] [id number]  Referring provider: Idelle Crouch, MD [facility] [place],  [place] [address]  Chief Complaint  Patient presents with  . Hydronephrosis    HPI: 85 year old male with metastatic right upper tract urothelial carcinoma who returns today for follow-up.  He has been managed with a chronic indwelling right ureteral stent due to concern for obstruction from the tumor.  More recently, for palliative reasons and chronic severe intermittent penile pain, his stent was removed on 08/11/2026.  He returns today with follow-up repeat imaging.  He has no overt hydronephrosis on his right side although there does appear to be some lesions within the ureter itself but no significant obstruction both on renal ultrasound from 09/09/2018 as well as CT abdomen pelvis with contrast on 09/17/2018.  He does have some mild progression of his disease is being managed by medical oncology.  He reports that since having a stent removed, he feels significantly improved.  He no longer has penile pain.  His urinary symptoms are also improving.  He continues to get up sometimes every 2 hours especially at nighttime but as little as 2 times on other nights.  He is run out of Myrbetriq and requesting refills today.  He continues take Flomax.  No dysuria or gross hematuria.  He is happy to have a stent removed.   PMH: Past Medical History:  Diagnosis Date  . Arthritis   . Benign fibroma of prostate 08/23/2013  . BPH (benign prostatic hyperplasia)   . Calculus of kidney 08/23/2013  . Glaucoma    no drops in 3 mo pressure good, pt denies glaucoma, eye pressure has been measuring alright.  . History of kidney stones   . HLD (hyperlipidemia)   . HOH (hard of hearing)    Left Hearing Aid  . HTN (hypertension) 12/26/2014  . Hypertension   . Hyponatremia 12/26/2014  . Migraines    history of migraines when he was younger.  [name]  Restless leg syndrome   . Sinus drainage   . Skin cancer   . Urothelial cancer ([place])    chemo tx's.  [name] UTI (lower urinary tract infection) 12/26/2014  . Vertigo     Surgical History: Past Surgical History:  Procedure Laterality Date  . COLONOSCOPY    . CYSTOSCOPY W/ RETROGRADES Right 01/30/2015   Procedure: CYSTOSCOPY WITH RETROGRADE PYELOGRAM;  Surgeon: [name], MD;  Location: [facility];  Service: Urology;  Laterality: Right;  . CYSTOSCOPY W/ RETROGRADES Bilateral 02/26/2016   Procedure: CYSTOSCOPY WITH RETROGRADE PYELOGRAM;  Surgeon: [name], MD;  Location: [facility];  Service: Urology;  Laterality: Bilateral;  . CYSTOSCOPY W/ URETERAL STENT PLACEMENT Right 08/20/2015   Procedure: CYSTOSCOPY WITH RETROGRADE PYELOGRAM/POSSIBLE URETERAL STENT PLACEMENT/BLADDER BIOPSY;  Surgeon: [name], MD;  Location: [facility];  Service: Urology;  Laterality: Right;  . CYSTOSCOPY W/ URETERAL STENT PLACEMENT Right 09/12/2015   Procedure: CYSTOSCOPY WITH STENT REPLACEMENT;  Surgeon: [name], MD;  Location: [facility];  Service: Urology;  Laterality: Right;  . CYSTOSCOPY WITH BIOPSY Right 09/12/2015   Procedure: CYSTOSCOPY WITH BLADDER AND URETERAL BIOPSY;  Surgeon: [name], MD;  Location: [facility];  Service: Urology;  Laterality: Right;  . CYSTOSCOPY WITH STENT PLACEMENT Right 01/30/2015   Procedure: CYSTOSCOPY WITH STENT PLACEMENT;  Surgeon: [name], MD;  Location: [facility];  Service: Urology;  Laterality: Right;  . CYSTOSCOPY WITH STENT PLACEMENT Right 12/28/2017   Procedure: CYSTOSCOPY WITH  STENT Exchange;  Surgeon: [name], MD;  Location: [facility];  Service: Urology;  Laterality: Right;  . CYSTOSCOPY/URETEROSCOPY/HOLMIUM LASER/STENT PLACEMENT Right 08/19/2017   Procedure: CYSTOSCOPY/URETEROSCOPY/HOLMIUM LASER/STENT PLACEMENT;  Surgeon: [name], MD;  Location: [facility];  Service: Urology;  Laterality: Right;  . EYE SURGERY Bilateral    Cataract Extraction  with IOL  . goiter removal    . HOLMIUM LASER APPLICATION N/A 06/26/6220   Procedure:  HOLMIUM LASER APPLICATION;  Surgeon: [name], MD;  Location: [facility];  Service: Urology;  Laterality: N/A;  . PORTA CATH INSERTION N/A 09/23/2017   Procedure: PORTA CATH INSERTION;  Surgeon: Algernon Huxley, MD;  Location: [facility];  Service: Cardiovascular;  Laterality: N/A;  . SPERMATOCELECTOMY    . TONSILLECTOMY    . TRANSURETHRAL RESECTION OF BLADDER TUMOR N/A 02/26/2016   Procedure: TRANSURETHRAL RESECTION OF BLADDER TUMOR (TURBT);  Surgeon: [name], MD;  Location: [facility];  Service: Urology;  Laterality: N/A;  . TRANSURETHRAL RESECTION OF BLADDER TUMOR WITH MITOMYCIN-C N/A 09/12/2015   Procedure: TRANSURETHRAL RESECTION OF BLADDER TUMOR ;  Surgeon: [name], MD;  Location: [facility];  Service: Urology;  Laterality: N/A;  . TRANSURETHRAL RESECTION OF BLADDER TUMOR WITH MITOMYCIN-C N/A 03/24/2016   Procedure: TRANSURETHRAL RESECTION OF BLADDER TUMOR WITH MITOMYCIN-C  (SMALL);  Surgeon: [name], MD;  Location: [facility];  Service: Urology;  Laterality: N/A;  . URETERAL BIOPSY Right 08/19/2017   Procedure: Renal Mass BIOPSY;  Surgeon: [name], MD;  Location: [facility];  Service: Urology;  Laterality: Right;  . URETEROSCOPY Right 01/30/2015   Procedure: URETEROSCOPY/ WITH BIOPSY AND CYTOLOGY BRUSHING;  Surgeon: [name], MD;  Location: [facility];  Service: Urology;  Laterality: Right;  . URETEROSCOPY Right 08/20/2015   Procedure: URETEROSCOPY;  Surgeon: [name], MD;  Location: [facility];  Service: Urology;  Laterality: Right;  . URETEROSCOPY Right 09/12/2015   Procedure: URETEROSCOPY;  Surgeon: [name], MD;  Location: [facility];  Service: Urology;  Laterality: Right;  . URETEROSCOPY Right 02/26/2016   Procedure: URETEROSCOPY;  Surgeon: [name], MD;  Location: [facility];  Service: Urology;  Laterality: Right;    Home Medications:  Allergies as of  09/28/2018      Reactions   Demerol [meperidine] Nausea And Vomiting   Lipitor [atorvastatin] Swelling   Sulfa Antibiotics Nausea And Vomiting, Rash      Medication List       Accurate as of September 28, 2018 11:32 AM. If you have any questions, ask your nurse or doctor.        acetaminophen 500 MG tablet Commonly known as: TYLENOL Take 1,000 mg by mouth daily as needed for moderate pain.   amLODipine 5 MG tablet Commonly known as: NORVASC Take 5 mg by mouth daily.   benazepril-hydrochlorthiazide 20-12.5 MG tablet Commonly known as: LOTENSIN HCT Take 1 tablet by mouth daily.   diazepam 5 MG tablet Commonly known as: VALIUM TAKE 1 TABLET (5 MG TOTAL) BY MOUTH EVERY 12 (TWELVE) HOURS AS NEEDED FOR ANXIETY OR SLEEP   docusate sodium 100 MG capsule Commonly known as: COLACE Take 100 mg by mouth 2 (two) times daily.   fenofibrate micronized 134 MG capsule Commonly known as: LOFIBRA Take 134 mg by mouth daily before breakfast.   fluticasone 50 MCG/ACT nasal spray Commonly known as: FLONASE Place 2 sprays into both nostrils at bedtime.   HYDROcodone-acetaminophen 5-325 MG tablet Commonly known as: NORCO/VICODIN Take 1 tablet by mouth every 6 (six) hours  as needed for severe pain.   hydrocortisone cream 1 % Apply 1 application topically daily as needed for itching.   multivitamin with minerals Tabs tablet Take 1 tablet by mouth daily.   Potassium 99 MG Tabs Take 99 mg by mouth at bedtime.   PROBIOTIC-10 PO Take by mouth every morning.   psyllium 58.6 % packet Commonly known as: METAMUCIL Take 1 packet by mouth daily.   tamsulosin 0.4 MG Caps capsule Commonly known as: FLOMAX Take 1 capsule (0.4 mg total) by mouth daily.       Allergies:  Allergies  Allergen Reactions  . Demerol [Meperidine] Nausea And Vomiting  . Lipitor [Atorvastatin] Swelling  . Sulfa Antibiotics Nausea And Vomiting and Rash    Family History: Family History  Problem Relation Age of  Onset  . Hypertension Mother   . Hypertension Father   . Prostate cancer Brother   . Kidney disease Neg Hx   . Kidney cancer Neg Hx   . Bladder Cancer Neg Hx     Social History:  reports that he has never smoked. He has never used smokeless tobacco. He reports that he does not drink alcohol or use drugs.  ROS: UROLOGY Frequent Urination?: No Hard to postpone urination?: No Burning/pain with urination?: No Get up at night to urinate?: Yes(not every night) Leakage of urine?: No Urine stream starts and stops?: No Trouble starting stream?: No Do you have to strain to urinate?: No Blood in urine?: No Urinary tract infection?: No Sexually transmitted disease?: No Injury to kidneys or bladder?: No Painful intercourse?: No Weak stream?: Yes Erection problems?: No Penile pain?: No  Gastrointestinal Nausea?: No Vomiting?: No Indigestion/heartburn?: No Diarrhea?: No Constipation?: No  Constitutional Fever: No Night sweats?: No Weight loss?: No Fatigue?: No  Skin Skin rash/lesions?: No Itching?: No  Eyes Blurred vision?: No Double vision?: No  Ears/Nose/Throat Sore throat?: No Sinus problems?: No  Hematologic/Lymphatic Swollen glands?: No Easy bruising?: No  Cardiovascular Leg swelling?: No Chest pain?: No  Respiratory Cough?: No Shortness of breath?: No  Endocrine Excessive thirst?: No  Musculoskeletal Back pain?: No Joint pain?: No  Neurological Headaches?: No Dizziness?: No  Psychologic Depression?: No Anxiety?: No  Physical Exam: BP (!) 159/98   Pulse 75   Ht 6' (1.829 m)   Wt 192 lb (87.1 kg)   BMI 26.04 kg/m   Constitutional:  Alert and oriented, No acute distress. HEENT: [place] AT, moist mucus membranes.  Trachea midline, no masses. Cardiovascular: No clubbing, cyanosis, or edema. Respiratory: Normal respiratory effort, no increased work of breathing. Skin: No rashes, bruises or suspicious lesions. Neurologic: Grossly intact, no  focal deficits, moving all 4 extremities. Psychiatric: Normal mood and affect.  Laboratory Data: Lab Results  Component Value Date   WBC 5.5 09/24/2018   HGB 12.9 (L) 09/24/2018   HCT 38.2 (L) 09/24/2018   MCV 85.7 09/24/2018   PLT 227 09/24/2018    Lab Results  Component Value Date   CREATININE 1.09 09/24/2018    Urinalysis Pending  Pertinent Imaging: Results for orders placed during the hospital encounter of 09/09/18  US RENAL   Narrative CLINICAL DATA:  Uroepithelial neoplasm.  EXAM: RENAL / URINARY TRACT ULTRASOUND COMPLETE  COMPARISON:  CT scan 06/02/2018  FINDINGS: Right Kidney:  Renal measurements: 10.7 x 4.6 x 5.8 cm = volume: 150.0 mL. Normal renal cortical thickness and echogenicity for age. Stable lower pole cyst measuring 4.6 x 4.3 x 4.4 cm. Ill-defined soft tissue density in the renal hilum  correlating with the abnormality on the CT examination.  Left Kidney:  Renal measurements: 12.1 x 5.6 x 4.9 center = volume: 171.8 mL. Normal renal cortical thickness for age and normal echogenicity. No hydronephrosis.  Bladder:  Appears normal for degree of bladder distention. Bilateral ureteral jets are noted. Mild prostate gland enlargement. Suspect TURP defect.  IMPRESSION: 1. Stable lower pole right renal cyst. 2. Ill-defined area of soft tissue echogenicity in the renal pelvis likely reflecting the patient's transitional cell neoplasm. No hydronephrosis. 3. Normal left kidney.   Electronically Signed   By: Marijo Sanes M.D.   On: 09/09/2018 16:59    Results for orders placed or performed in visit on 09/28/18  Bladder Scan (Post Void Residual) in office  Result Value Ref Range   Scan Result 26ml    CT scan imaging was personally reviewed today.  Agree with radiologic interpretation.  Renal ultrasound from 09/2018 was also reviewed.  No evidence of right-sided hydronephrosis.  Assessment & Plan:    1. Urothelial carcinoma of kidney, right  [facility]) Managed by medical oncology, metastatic as well as locally advanced  For palliative reasons, stent was removed and at this point, he is no hydronephrosis or ureteral obstruction.  He is at high risk for this to recur, but currently doing well.  Renal function is stable.  We will continue to hold off on replacing the stent unless he develops recurrence of his hydronephrosis or his renal function worsens.  I will have him follow-up with me as needed  (will notify Dr. Loyola Mast borders to refer back if this should recur on follow-up imaging)  - Bladder Scan (Post Void Residual) in office - Urinalysis, Complete  2. Penile pain Resolved  3. Nocturia No evidence of UTI or incomplete emptying is contributing factor Discussed behavioral modification Patient would like to resume Myrbetriq 50 mg in addition to Flomax which is reasonable, he was given samples x1 month and prescription provided   [name], MD  [place] [address], [place], [place] [address] [phone]  I spent 25 min with this patient of which greater than 50% was spent in counseling and coordination of care with the patient.

## 2018-09-29 LAB — URINALYSIS, COMPLETE
Bilirubin, UA: NEGATIVE
Glucose, UA: NEGATIVE
Ketones, UA: NEGATIVE
Leukocytes,UA: NEGATIVE
Nitrite, UA: NEGATIVE
Protein,UA: NEGATIVE
Specific Gravity, UA: 1.015 (ref 1.005–1.030)
Urobilinogen, Ur: 0.2 mg/dL (ref 0.2–1.0)
pH, UA: 7 (ref 5.0–7.5)

## 2018-09-29 LAB — MICROSCOPIC EXAMINATION: Bacteria, UA: NONE SEEN

## 2018-10-11 ENCOUNTER — Telehealth: Payer: Self-pay | Admitting: *Deleted

## 2018-10-11 ENCOUNTER — Other Ambulatory Visit: Payer: Self-pay

## 2018-10-11 ENCOUNTER — Inpatient Hospital Stay: Payer: Medicare HMO | Attending: Oncology | Admitting: Oncology

## 2018-10-11 VITALS — BP 138/79 | HR 82 | Temp 98.3°F | Resp 18 | Wt 193.3 lb

## 2018-10-11 DIAGNOSIS — Z79899 Other long term (current) drug therapy: Secondary | ICD-10-CM | POA: Insufficient documentation

## 2018-10-11 DIAGNOSIS — M1990 Unspecified osteoarthritis, unspecified site: Secondary | ICD-10-CM | POA: Diagnosis not present

## 2018-10-11 DIAGNOSIS — Z8249 Family history of ischemic heart disease and other diseases of the circulatory system: Secondary | ICD-10-CM | POA: Insufficient documentation

## 2018-10-11 DIAGNOSIS — K5730 Diverticulosis of large intestine without perforation or abscess without bleeding: Secondary | ICD-10-CM | POA: Insufficient documentation

## 2018-10-11 DIAGNOSIS — I2510 Atherosclerotic heart disease of native coronary artery without angina pectoris: Secondary | ICD-10-CM | POA: Insufficient documentation

## 2018-10-11 DIAGNOSIS — Z85828 Personal history of other malignant neoplasm of skin: Secondary | ICD-10-CM | POA: Diagnosis not present

## 2018-10-11 DIAGNOSIS — R918 Other nonspecific abnormal finding of lung field: Secondary | ICD-10-CM | POA: Diagnosis not present

## 2018-10-11 DIAGNOSIS — K8020 Calculus of gallbladder without cholecystitis without obstruction: Secondary | ICD-10-CM | POA: Diagnosis not present

## 2018-10-11 DIAGNOSIS — L304 Erythema intertrigo: Secondary | ICD-10-CM | POA: Diagnosis not present

## 2018-10-11 DIAGNOSIS — N281 Cyst of kidney, acquired: Secondary | ICD-10-CM | POA: Insufficient documentation

## 2018-10-11 DIAGNOSIS — R21 Rash and other nonspecific skin eruption: Secondary | ICD-10-CM | POA: Insufficient documentation

## 2018-10-11 DIAGNOSIS — Z885 Allergy status to narcotic agent status: Secondary | ICD-10-CM | POA: Insufficient documentation

## 2018-10-11 DIAGNOSIS — I700 Atherosclerosis of aorta: Secondary | ICD-10-CM | POA: Diagnosis not present

## 2018-10-11 DIAGNOSIS — R911 Solitary pulmonary nodule: Secondary | ICD-10-CM | POA: Diagnosis not present

## 2018-10-11 DIAGNOSIS — Z87442 Personal history of urinary calculi: Secondary | ICD-10-CM | POA: Insufficient documentation

## 2018-10-11 DIAGNOSIS — L270 Generalized skin eruption due to drugs and medicaments taken internally: Secondary | ICD-10-CM | POA: Diagnosis not present

## 2018-10-11 DIAGNOSIS — N400 Enlarged prostate without lower urinary tract symptoms: Secondary | ICD-10-CM | POA: Insufficient documentation

## 2018-10-11 DIAGNOSIS — Z882 Allergy status to sulfonamides status: Secondary | ICD-10-CM | POA: Insufficient documentation

## 2018-10-11 DIAGNOSIS — Z8042 Family history of malignant neoplasm of prostate: Secondary | ICD-10-CM | POA: Insufficient documentation

## 2018-10-11 DIAGNOSIS — Z9221 Personal history of antineoplastic chemotherapy: Secondary | ICD-10-CM | POA: Insufficient documentation

## 2018-10-11 DIAGNOSIS — C669 Malignant neoplasm of unspecified ureter: Secondary | ICD-10-CM | POA: Insufficient documentation

## 2018-10-11 DIAGNOSIS — Z5112 Encounter for antineoplastic immunotherapy: Secondary | ICD-10-CM | POA: Insufficient documentation

## 2018-10-11 DIAGNOSIS — K862 Cyst of pancreas: Secondary | ICD-10-CM | POA: Diagnosis not present

## 2018-10-11 DIAGNOSIS — R590 Localized enlarged lymph nodes: Secondary | ICD-10-CM | POA: Diagnosis not present

## 2018-10-11 MED ORDER — TRIAMCINOLONE ACETONIDE 0.5 % EX OINT: 1.0000 "application " | TOPICAL_OINTMENT | Freq: Two times a day (BID) | CUTANEOUS | 0 refills | Status: DC

## 2018-10-11 MED ORDER — PREDNISONE 50 MG PO TABS: ORAL_TABLET | ORAL | 0 refills | Status: DC

## 2018-10-11 MED ORDER — NYSTATIN 100000 UNIT/GM EX POWD: Freq: Four times a day (QID) | CUTANEOUS | 0 refills | Status: DC

## 2018-10-11 NOTE — Telephone Encounter (Signed)
Would he like to be seen in [facility]?I can see him this afternoon either via virtual or in person.

## 2018-10-11 NOTE — Telephone Encounter (Signed)
I attempted several times to contact patient and there is no answer or voice mail

## 2018-10-11 NOTE — Progress Notes (Signed)
Patient has a rash on trunk area, couple places on left foot, and arms for the past week.  The rash does itch on occasion and he states if it didn't itch it wouldn't bother him.  Only medication he has taken for the rash is a Tylenol and he did take 1/2 Valium before his appointment.  138/79

## 2018-10-11 NOTE — Telephone Encounter (Signed)
I was able to reach his wife and she accepted 2 PM appointment for patient to see Lorretta Harp, NP

## 2018-10-11 NOTE — Progress Notes (Signed)
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[place])  09/15/2017 Cancer Staging   Staging form: Renal Pelvis and Ureter, AJCC 8th Edition - Clinical stage from 09/15/2017: Stage IV (cT2, cN0, cM1) - Signed by Sindy Guadeloupe, MD on 09/17/2017   09/17/2017 Initial Diagnosis   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[place])   09/22/2017 - 06/07/2018 Chemotherapy   The patient had palonosetron (ALOXI) injection 0.25 mg, 0.25 mg, Intravenous,  Once, 9 of 10 cycles Administration: 0.25 mg (09/22/2017), 0.25 mg (10/06/2017), 0.25 mg (10/20/2017), 0.25 mg (11/03/2017), 0.25 mg (11/17/2017), 0.25 mg (12/01/2017), 0.25 mg (12/15/2017), 0.25 mg (12/29/2017), 0.25 mg (01/12/2018), 0.25 mg (01/26/2018), 0.25 mg (02/09/2018), 0.25 mg (02/23/2018), 0.25 mg (03/16/2018), 0.25 mg (04/13/2018), 0.25 mg (05/11/2018) pegfilgrastim-cbqv (UDENYCA) injection 6 mg, 6 mg, Subcutaneous, Once, 5 of 6 cycles Administration: 6 mg (01/13/2018), 6 mg (02/10/2018), 6 mg (03/17/2018), 6 mg (04/15/2018), 6 mg (05/12/2018), 6 mg (05/26/2018) CARBOplatin (PARAPLATIN) 160 mg in sodium chloride 0.9 % 250 mL chemo infusion, 160 mg (100 % of original dose 163.2 mg), Intravenous,  Once, 9 of 10 cycles Dose modification:   (original dose 163.2 mg, Cycle 1) Administration: 160 mg (09/22/2017), 170 mg (10/06/2017), 170 mg (10/20/2017), 170 mg (11/17/2017), 160 mg (12/01/2017), 170 mg (11/03/2017), 160 mg (12/15/2017), 160 mg (12/29/2017), 180 mg (01/12/2018), 180 mg (01/26/2018), 160 mg (02/09/2018), 160 mg (02/23/2018), 160 mg  (03/16/2018), 160 mg (03/30/2018), 160 mg (04/13/2018), 180 mg (04/27/2018), 180 mg (05/11/2018), 160 mg (05/25/2018) gemcitabine (GEMZAR) 1,600 mg in sodium chloride 0.9 % 250 mL chemo infusion, 1,672 mg (100 % of original dose 800 mg/m2), Intravenous,  Once, 9 of 10 cycles Dose modification: 800 mg/m2 (original dose 800 mg/m2, Cycle 1, Reason: Patient Age) Administration: 1,600 mg (09/22/2017), 1,600 mg (10/06/2017), 1,600 mg (10/20/2017), 1,600 mg (11/03/2017), 1,600 mg (11/17/2017), 1,600 mg (12/01/2017), 1,600 mg (12/15/2017), 1,600 mg (12/29/2017), 1,600 mg (01/12/2018), 1,600 mg (01/26/2018), 1,600 mg (02/09/2018), 1,600 mg (02/23/2018), 1,600 mg (03/16/2018), 1,600 mg (03/30/2018), 1,600 mg (04/13/2018), 1,600 mg (04/27/2018), 1,600 mg (05/11/2018), 1,600 mg (05/25/2018)  for chemotherapy treatment.    06/08/2018 -  Chemotherapy   The patient had pembrolizumab (KEYTRUDA) 200 mg in sodium chloride 0.9 % 50 mL chemo infusion, 200 mg, Intravenous, Once, 6 of 7 cycles Administration: 200 mg (06/08/2018), 200 mg (06/29/2018), 200 mg (07/20/2018), 200 mg (08/13/2018), 200 mg (09/03/2018), 200 mg (09/24/2018)  for chemotherapy treatment.       Interval history-Anthony Gonzales is a 85 year old male with above history who presents to [facility] for complaints of rash.  He is currently on second line Keytruda after poor toleration of carbo/Gemzar.  He is status post TURBT 2017.  Had CT abdomen on 08/06/2017 which showed a soft tissue mass in the right renal pelvis concerning for upper tract urothelial neoplasm.  Had diagnostic ureteroscopy was found to have 2 high-grade lesions within his right kidney.  Had CT chest which showed a rounded nodule in right upper lobe measuring 14 mm concerning for metastasis.  Plan initially was neoadjuvant chemotherapy followed by possible surgery but given the presence of lung  lesion patient has been referred to oncology.  He has had 4 cycles of Keytruda.  Last given on 09/24/2018.  He is due to return to clinic  on Friday, 10/15/2018 for assessment prior to cycle 5.  Today, patient complains of rash that started approximately 1 week ago.  He noticed it first on his abdomen.  It is spread to his chest and upper back and legs.  He has several lesions on his scalp.  He describes the lesions as itchy.  He has tried Eucerin lotion with some relief.  He also complains of scrotal irritation that is intermittently itchy.  He has not tried anything for this.  He tells me he bathes every other day because he "does not do anything".  He denies any fevers or recent illnesses.  He denies any easy bleeding or bruising.  He reports a good appetite and denies weight loss.  Denies chest pain, nausea, vomiting, constipation or diarrhea.  He denies urinary complaints.  ECOG FS:1 - Symptomatic but completely ambulatory  Review of systems- Review of Systems  Constitutional: Negative.  Negative for chills, fever, malaise/fatigue and weight loss.  HENT: Negative for congestion, ear pain and tinnitus.   Eyes: Negative.  Negative for blurred vision and double vision.  Respiratory: Negative.  Negative for cough, sputum production and shortness of breath.   Cardiovascular: Negative.  Negative for chest pain, palpitations and leg swelling.  Gastrointestinal: Negative.  Negative for abdominal pain, constipation, diarrhea, nausea and vomiting.  Genitourinary: Negative for dysuria, frequency and urgency.  Musculoskeletal: Negative for back pain and falls.  Skin: Positive for itching and rash.  Neurological: Negative.  Negative for weakness and headaches.  Endo/Heme/Allergies: Negative.  Does not bruise/bleed easily.  Psychiatric/Behavioral: Negative.  Negative for depression. The patient is not nervous/anxious and does not have insomnia.      Current treatment- S/P 4 cycles Keytruda.  Last given on 09/24/2018.  Allergies  Allergen Reactions   Demerol [Meperidine] Nausea And Vomiting   Lipitor [Atorvastatin] Swelling   Sulfa  Antibiotics Nausea And Vomiting and Rash     Past Medical History:  Diagnosis Date   Arthritis    Benign fibroma of prostate 08/23/2013   BPH (benign prostatic hyperplasia)    Calculus of kidney 08/23/2013   Glaucoma    no drops in 3 mo pressure good, pt denies glaucoma, eye pressure has been measuring alright.   History of kidney stones    HLD (hyperlipidemia)    HOH (hard of hearing)    Left Hearing Aid   HTN (hypertension) 12/26/2014   Hypertension    Hyponatremia 12/26/2014   Migraines    history of migraines when he was younger.   Restless leg syndrome    Sinus drainage    Skin cancer    Urothelial cancer ([place])    chemo tx's.   UTI (lower urinary tract infection) 12/26/2014   Vertigo      Past Surgical History:  Procedure Laterality Date   COLONOSCOPY     CYSTOSCOPY W/ RETROGRADES Right 01/30/2015   Procedure: CYSTOSCOPY WITH RETROGRADE PYELOGRAM;  Surgeon: [name], MD;  Location: [facility];  Service: Urology;  Laterality: Right;   CYSTOSCOPY W/ RETROGRADES Bilateral 02/26/2016   Procedure: CYSTOSCOPY WITH RETROGRADE PYELOGRAM;  Surgeon: [name], MD;  Location: [facility];  Service: Urology;  Laterality: Bilateral;   CYSTOSCOPY W/ URETERAL STENT PLACEMENT Right 08/20/2015   Procedure: CYSTOSCOPY WITH RETROGRADE PYELOGRAM/POSSIBLE URETERAL STENT PLACEMENT/BLADDER BIOPSY;  Surgeon: [name], MD;  Location:  [facility];  Service: Urology;  Laterality: Right;   CYSTOSCOPY W/ URETERAL STENT PLACEMENT Right 09/12/2015   Procedure: CYSTOSCOPY WITH STENT REPLACEMENT;  Surgeon: [name], MD;  Location: [facility];  Service: Urology;  Laterality: Right;   CYSTOSCOPY WITH BIOPSY Right 09/12/2015   Procedure: CYSTOSCOPY WITH BLADDER AND URETERAL BIOPSY;  Surgeon: [name], MD;  Location: [facility];  Service: Urology;  Laterality: Right;   CYSTOSCOPY WITH STENT PLACEMENT Right 01/30/2015   Procedure: CYSTOSCOPY WITH STENT PLACEMENT;   Surgeon: [name], MD;  Location: [facility];  Service: Urology;  Laterality: Right;   CYSTOSCOPY WITH STENT PLACEMENT Right 12/28/2017   Procedure: CYSTOSCOPY WITH STENT Exchange;  Surgeon: [name], MD;  Location: [facility];  Service: Urology;  Laterality: Right;   CYSTOSCOPY/URETEROSCOPY/HOLMIUM LASER/STENT PLACEMENT Right 08/19/2017   Procedure: CYSTOSCOPY/URETEROSCOPY/HOLMIUM LASER/STENT PLACEMENT;  Surgeon: [name], MD;  Location: [facility];  Service: Urology;  Laterality: Right;   EYE SURGERY Bilateral    Cataract Extraction with IOL   goiter removal     HOLMIUM LASER APPLICATION N/A 10/18/9145   Procedure:  HOLMIUM LASER APPLICATION;  Surgeon: [name], MD;  Location: [facility];  Service: Urology;  Laterality: N/A;   PORTA CATH INSERTION N/A 09/23/2017   Procedure: PORTA CATH INSERTION;  Surgeon: Algernon Huxley, MD;  Location: [facility];  Service: Cardiovascular;  Laterality: N/A;   SPERMATOCELECTOMY     TONSILLECTOMY     TRANSURETHRAL RESECTION OF BLADDER TUMOR N/A 02/26/2016   Procedure: TRANSURETHRAL RESECTION OF BLADDER TUMOR (TURBT);  Surgeon: [name], MD;  Location: [facility];  Service: Urology;  Laterality: N/A;   TRANSURETHRAL RESECTION OF BLADDER TUMOR WITH MITOMYCIN-C N/A 09/12/2015   Procedure: TRANSURETHRAL RESECTION OF BLADDER TUMOR ;  Surgeon: [name], MD;  Location: [facility];  Service: Urology;  Laterality: N/A;   TRANSURETHRAL RESECTION OF BLADDER TUMOR WITH MITOMYCIN-C N/A 03/24/2016   Procedure: TRANSURETHRAL RESECTION OF BLADDER TUMOR WITH MITOMYCIN-C  (SMALL);  Surgeon: [name], MD;  Location: [facility];  Service: Urology;  Laterality: N/A;   URETERAL BIOPSY Right 08/19/2017   Procedure: Renal Mass BIOPSY;  Surgeon: [name], MD;  Location: [facility];  Service: Urology;  Laterality: Right;   URETEROSCOPY Right 01/30/2015   Procedure: URETEROSCOPY/ WITH BIOPSY AND CYTOLOGY BRUSHING;  Surgeon: [name], MD;  Location: [facility];  Service: Urology;  Laterality: Right;   URETEROSCOPY Right 08/20/2015   Procedure: URETEROSCOPY;  Surgeon: [name], MD;  Location: [facility];  Service: Urology;  Laterality: Right;   URETEROSCOPY Right 09/12/2015   Procedure: URETEROSCOPY;  Surgeon: [name], MD;  Location: [facility];  Service: Urology;  Laterality: Right;   URETEROSCOPY Right 02/26/2016   Procedure: URETEROSCOPY;  Surgeon: [name], MD;  Location: [facility];  Service: Urology;  Laterality: Right;    Social History   Socioeconomic History   Marital status: Married    Spouse name: Not on file   Number of children: Not on file   Years of education: Not on file   Highest education level: Not on file  Occupational History   Not on file  Social Needs   Financial resource strain: Not on file   Food insecurity    Worry: Not on file    Inability: Not on file   Transportation needs    Medical: Not on file    Non-medical: Not on file  Tobacco Use   Smoking status: Never Smoker   Smokeless tobacco: Never Used  Substance  and Sexual Activity   Alcohol use: No    Alcohol/week: 0.0 standard drinks   Drug use: No   Sexual activity: Not on file  Lifestyle   Physical activity    Days per week: Not on file    Minutes per session: Not on file   Stress: Not on file  Relationships   Social connections    Talks on phone: Not on file    Gets together: Not on file    Attends religious service: Not on file    Active member of club or organization: Not on file    Attends meetings of clubs or organizations: Not on file    Relationship status: Not on file   Intimate partner violence    Fear of current or ex partner: Not on file    Emotionally abused: Not on file    Physically abused: Not on file    Forced sexual activity: Not on file  Other Topics Concern   Not on file  Social History Narrative   Not on file    Family History  Problem Relation Age of  Onset   Hypertension Mother    Hypertension Father    Prostate cancer Brother    Kidney disease Neg Hx    Kidney cancer Neg Hx    Bladder Cancer Neg Hx      Current Outpatient Medications:    acetaminophen (TYLENOL) 500 MG tablet, Take 1,000 mg by mouth daily as needed for moderate pain. , Disp: , Rfl:    amLODipine (NORVASC) 5 MG tablet, Take 5 mg by mouth daily. , Disp: , Rfl:    benazepril-hydrochlorthiazide (LOTENSIN HCT) 20-12.5 MG tablet, Take 1 tablet by mouth daily. , Disp: , Rfl:    diazepam (VALIUM) 5 MG tablet, TAKE 1 TABLET (5 MG TOTAL) BY MOUTH EVERY 12 (TWELVE) HOURS AS NEEDED FOR ANXIETY OR SLEEP, Disp: , Rfl:    docusate sodium (COLACE) 100 MG capsule, Take 100 mg by mouth 2 (two) times daily. , Disp: , Rfl:    fenofibrate micronized (LOFIBRA) 134 MG capsule, Take 134 mg by mouth daily before breakfast. , Disp: , Rfl:    fluticasone (FLONASE) 50 MCG/ACT nasal spray, Place 2 sprays into both nostrils at bedtime., Disp: , Rfl:    hydrocortisone cream 1 %, Apply 1 application topically daily as needed for itching., Disp: , Rfl:    mirabegron ER (MYRBETRIQ) 50 MG TB24 tablet, Take 1 tablet (50 mg total) by mouth daily., Disp: 30 tablet, Rfl: 11   Multiple Vitamin (MULTIVITAMIN WITH MINERALS) TABS tablet, Take 1 tablet by mouth daily., Disp: , Rfl:    Potassium 99 MG TABS, Take 99 mg by mouth at bedtime., Disp: , Rfl:    Probiotic Product (PROBIOTIC-10 PO), Take by mouth every morning., Disp: , Rfl:    psyllium (METAMUCIL) 58.6 % packet, Take 1 packet by mouth daily. , Disp: , Rfl:    tamsulosin (FLOMAX) 0.4 MG CAPS capsule, Take 1 capsule (0.4 mg total) by mouth daily., Disp: 90 capsule, Rfl: 3   HYDROcodone-acetaminophen (NORCO/VICODIN) 5-325 MG tablet, Take 1 tablet by mouth every 6 (six) hours as needed for severe pain. (Patient not taking: Reported on 09/28/2018), Disp: 30 tablet, Rfl: 0   nystatin (MYCOSTATIN/NYSTOP) powder, Apply topically 4 (four)  times daily., Disp: 15 g, Rfl: 0   predniSONE (DELTASONE) 50 MG tablet, Take for the next 5 days, Disp: 5 tablet, Rfl: 0   triamcinolone ointment (KENALOG) 0.5 %, Apply 1 application  topically 2 (two) times daily., Disp: 30 g, Rfl: 0  Physical exam:  Vitals:   10/11/18 1407  BP: 138/79  Pulse: 82  Resp: 18  Temp: 98.3 F (36.8 C)  Weight: 193 lb 4.8 oz (87.7 kg)   Physical Exam Constitutional:      Appearance: Normal appearance.  HENT:     Head: Normocephalic and atraumatic.  Eyes:     Pupils: Pupils are equal, round, and reactive to light.  Neck:     Musculoskeletal: Normal range of motion.  Cardiovascular:     Rate and Rhythm: Normal rate and regular rhythm.     Heart sounds: Normal heart sounds. No murmur.  Pulmonary:     Effort: Pulmonary effort is normal.     Breath sounds: Normal breath sounds. No wheezing.  Abdominal:     General: Bowel sounds are normal. There is no distension.     Palpations: Abdomen is soft.     Tenderness: There is no abdominal tenderness.  Genitourinary:    Comments: Mild irritation to posterior scrotum and on inside of bilateral thighs Musculoskeletal: Normal range of motion.  Skin:    General: Skin is warm and dry.     Findings: Lesion and rash present. Rash is macular and papular.     Comments: On Chest, abomen, upper back, scalp and legs- See photos   Neurological:     Mental Status: He is alert and oriented to person, place, and time.  Psychiatric:        Judgment: Judgment normal.    Media Information    Document Information  Photos    10/11/2018 14:25  Attached To:  Office Visit on 10/11/18 with Jacquelin Hawking, NP  Source Information  Jacquelin Hawking, NP   [facility]   Media Information    Document Information  Photos    10/11/2018 14:25  Attached To:  Office Visit on 10/11/18 with Jacquelin Hawking, NP  Source Information      Document Information   CMP Latest Ref Rng & Units 09/24/2018    Glucose 70 - 99 mg/dL 121(H)  BUN 8 - 23 mg/dL 18  Creatinine 0.61 - 1.24 mg/dL 1.09  Sodium 135 - 145 mmol/L 136  Potassium 3.5 - 5.1 mmol/L 4.0  Chloride 98 - 111 mmol/L 100  CO2 22 - 32 mmol/L 28  Calcium 8.9 - 10.3 mg/dL 8.9  Total Protein 6.5 - 8.1 g/dL 6.9  Total Bilirubin 0.3 - 1.2 mg/dL 0.7  Alkaline Phos 38 - 126 U/L 57  AST 15 - 41 U/L 31  ALT 0 - 44 U/L 20   CBC Latest Ref Rng & Units 09/24/2018  WBC 4.0 - 10.5 K/uL 5.5  Hemoglobin 13.0 - 17.0 g/dL 12.9(L)  Hematocrit 39.0 - 52.0 % 38.2(L)  Platelets 150 - 400 K/uL 227    No images are attached to the encounter.  Ct Chest W Contrast  Result Date: 09/17/2018 CLINICAL DATA:  History of bladder cancer, now with right upper tract urothelial neoplasm and suspected right lung metastasis. EXAM: CT CHEST, ABDOMEN, AND PELVIS WITH CONTRAST TECHNIQUE: Multidetector CT imaging of the chest, abdomen and pelvis was performed following the standard protocol during bolus administration of intravenous contrast. CONTRAST:  151m OMNIPAQUE IOHEXOL 300 MG/ML  SOLN COMPARISON:  06/02/2018. FINDINGS: CT CHEST FINDINGS Cardiovascular: The heart is normal in size. No pericardial effusion. No evidence of thoracic aortic aneurysm. Atherosclerotic calcifications of the aortic arch. Coronary atherosclerosis of the LAD. Right chest  port terminates in the mid SVC. Mediastinum/Nodes: No suspicious mediastinal lymphadenopathy. 7 mm short axis right hilar node, new, although within normal limits. Visualized thyroid is unremarkable. Lungs/Pleura: 14 x 11 mm nodule in the medial right upper lobe (series 3/image 83), previously 13 x 11 mm, grossly unchanged. 5 mm subpleural nodule in the superior segment left lower lobe (series 3/image 91), previously 10 mm. Adjacent 4 mm subpleural nodule in the superior segment left lower lobe (series 3/image 90), previously 3 mm. Additional 2-3 mm left lower lobe nodules (series 3/images 111, 125, and 138), unchanged. 3 mm left  upper lobe nodule (series 3/image 70), unchanged. 3 mm right lower lobe nodule (series 3/image 135), unchanged. Mild compressive atelectasis in the medial right lower lobe. No focal consolidation. No pleural effusion or pneumothorax. Musculoskeletal: Degenerative changes of the thoracic spine. CT ABDOMEN PELVIS FINDINGS Hepatobiliary: Liver is within normal limits. Cholelithiasis, without associated inflammatory changes. No intrahepatic or extrahepatic ductal dilatation. Pancreas: 2.0 cm fluid density lesion along the inferior aspect of the uncinate process (series 2/image 86), unchanged, favoring a benign pseudocyst or side branch IPMN. Spleen: Within normal limits. Adrenals/Urinary Tract: Adrenal glands are within normal limits. Bilateral renal cysts, measuring 4.3 cm in the right lower kidney (series 9/image 29) and 8 mm in the left upper kidney (series 9/image 19), benign (Bosniak I). No enhancing renal lesions. Urothelial thickening in the right upper pole collecting system, corresponding to the patient's known upper tract disease, measuring approximately 1.5 x 3.2 cm (series 9/image 21). This is difficult to discretely measure and compare across multiple studies. Additional 13 mm enhancing urothelial lesion inferiorly in the right renal collecting system (series 9/image 25), also difficult to discretely measure and compare. A hematuria study was not performed, and as such, delays were not continued into the pelvis. However, additional filling defects are suspected in the proximal/mid ureter which were not definitely present on the prior (series 9/images 33 and 36), raising concern for additional sites of urothelial tumor. 4 mm hyperdense/enhancing lesion at the right ureteral orifice/UVJ (series 2/image 125) may reflect an additional site of tumor, poorly evaluated. Bladder is mildly thick-walled but underdistended. Stomach/Bowel: Stomach is within normal limits. No evidence of bowel obstruction. Normal  appendix (series 2/image 99). Sigmoid diverticulosis, without evidence of diverticulitis. Vascular/Lymphatic: No evidence of abdominal aortic aneurysm. Atherosclerotic calcifications of the abdominal aorta and branch vessels. 17 mm short axis right obturator node (series 2/image 119), previously 13 mm when measured in a similar fashion. Reproductive: Prostate is grossly unremarkable. Other: No abdominopelvic ascites. Musculoskeletal: Degenerative changes of the lumbar spine. IMPRESSION: Two urothelial lesions in the right renal collecting system are similar but difficult to directly compare. Possible new filling defects in the proximal/mid right ureter, poorly evaluated on this non hematuria study. Additional possible 4 mm lesion at the right ureteral orifice/UVJ is also poorly evaluated. 17 mm short axis right obturator node, compatible with nodal metastasis, mildly progressive. 5 mm left lower lobe nodule, previously 10 mm, favoring improving metastasis. Additional pulmonary nodules are grossly unchanged, including the dominant 14 mm right upper lobe nodule, indeterminate. 2.0 cm unilocular cystic lesion in the pancreatic uncinate process, favoring a benign pseudocyst or side branch IPMN, of questionable clinical significance in this patient. Attention on follow-up is likely satisfactory. Electronically Signed   By: Julian Hy M.D.   On: 09/17/2018 22:18   Ct Abdomen Pelvis W Contrast  Result Date: 09/17/2018 CLINICAL DATA:  History of bladder cancer, now with right upper tract urothelial neoplasm  and suspected right lung metastasis. EXAM: CT CHEST, ABDOMEN, AND PELVIS WITH CONTRAST TECHNIQUE: Multidetector CT imaging of the chest, abdomen and pelvis was performed following the standard protocol during bolus administration of intravenous contrast. CONTRAST:  184m OMNIPAQUE IOHEXOL 300 MG/ML  SOLN COMPARISON:  06/02/2018. FINDINGS: CT CHEST FINDINGS Cardiovascular: The heart is normal in size. No  pericardial effusion. No evidence of thoracic aortic aneurysm. Atherosclerotic calcifications of the aortic arch. Coronary atherosclerosis of the LAD. Right chest port terminates in the mid SVC. Mediastinum/Nodes: No suspicious mediastinal lymphadenopathy. 7 mm short axis right hilar node, new, although within normal limits. Visualized thyroid is unremarkable. Lungs/Pleura: 14 x 11 mm nodule in the medial right upper lobe (series 3/image 83), previously 13 x 11 mm, grossly unchanged. 5 mm subpleural nodule in the superior segment left lower lobe (series 3/image 91), previously 10 mm. Adjacent 4 mm subpleural nodule in the superior segment left lower lobe (series 3/image 90), previously 3 mm. Additional 2-3 mm left lower lobe nodules (series 3/images 111, 125, and 138), unchanged. 3 mm left upper lobe nodule (series 3/image 70), unchanged. 3 mm right lower lobe nodule (series 3/image 135), unchanged. Mild compressive atelectasis in the medial right lower lobe. No focal consolidation. No pleural effusion or pneumothorax. Musculoskeletal: Degenerative changes of the thoracic spine. CT ABDOMEN PELVIS FINDINGS Hepatobiliary: Liver is within normal limits. Cholelithiasis, without associated inflammatory changes. No intrahepatic or extrahepatic ductal dilatation. Pancreas: 2.0 cm fluid density lesion along the inferior aspect of the uncinate process (series 2/image 86), unchanged, favoring a benign pseudocyst or side branch IPMN. Spleen: Within normal limits. Adrenals/Urinary Tract: Adrenal glands are within normal limits. Bilateral renal cysts, measuring 4.3 cm in the right lower kidney (series 9/image 29) and 8 mm in the left upper kidney (series 9/image 19), benign (Bosniak I). No enhancing renal lesions. Urothelial thickening in the right upper pole collecting system, corresponding to the patient's known upper tract disease, measuring approximately 1.5 x 3.2 cm (series 9/image 21). This is difficult to discretely  measure and compare across multiple studies. Additional 13 mm enhancing urothelial lesion inferiorly in the right renal collecting system (series 9/image 25), also difficult to discretely measure and compare. A hematuria study was not performed, and as such, delays were not continued into the pelvis. However, additional filling defects are suspected in the proximal/mid ureter which were not definitely present on the prior (series 9/images 33 and 36), raising concern for additional sites of urothelial tumor. 4 mm hyperdense/enhancing lesion at the right ureteral orifice/UVJ (series 2/image 125) may reflect an additional site of tumor, poorly evaluated. Bladder is mildly thick-walled but underdistended. Stomach/Bowel: Stomach is within normal limits. No evidence of bowel obstruction. Normal appendix (series 2/image 99). Sigmoid diverticulosis, without evidence of diverticulitis. Vascular/Lymphatic: No evidence of abdominal aortic aneurysm. Atherosclerotic calcifications of the abdominal aorta and branch vessels. 17 mm short axis right obturator node (series 2/image 119), previously 13 mm when measured in a similar fashion. Reproductive: Prostate is grossly unremarkable. Other: No abdominopelvic ascites. Musculoskeletal: Degenerative changes of the lumbar spine. IMPRESSION: Two urothelial lesions in the right renal collecting system are similar but difficult to directly compare. Possible new filling defects in the proximal/mid right ureter, poorly evaluated on this non hematuria study. Additional possible 4 mm lesion at the right ureteral orifice/UVJ is also poorly evaluated. 17 mm short axis right obturator node, compatible with nodal metastasis, mildly progressive. 5 mm left lower lobe nodule, previously 10 mm, favoring improving metastasis. Additional pulmonary nodules are grossly unchanged,  including the dominant 14 mm right upper lobe nodule, indeterminate. 2.0 cm unilocular cystic lesion in the pancreatic  uncinate process, favoring a benign pseudocyst or side branch IPMN, of questionable clinical significance in this patient. Attention on follow-up is likely satisfactory. Electronically Signed   By: Julian Hy M.D.   On: 09/17/2018 22:18     Assessment and plan- Patient is a 85 y.o. male who presents to [facility] for complaints of a rash x1 week on his scalp, chest, upper back, abdomen and bilateral lower extremities.  Rash is itchy.  He also complains of scrotal discomfort.  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  He is status post TURBT.  Previously on carbo/Gemzar which began in July 2019 until disease progression noted in March 2020.  Switched to [employer] second line.  Has had 4 cycles of Keytruda.  Followed by Dr. Janese Banks.  Scheduled for labs, assessment and cycle 5 Keytruda on Friday.  Has tolerated well to date.  Maculopapular rash: This is likely due to [facility].  Per NCCN guidelines, moderate and pruritic maculopapular rash patient to take prednisone 0.5/1 mg/kg/day for 5 to 7 days.  Patient is currently on immunotherapy which is less effective when using steroids.  Will reach out to Dr. Janese Banks to see if his Keytruda infusion needs to be moved back 1 week to see if rash resolves.  I also recommend him trying an antihistamine such as Zyrtec or Claritin daily.  Scrotal intertriginous dermatitis: Mild irritation and yeast present on examination.  Likely due to body habitus and sweating.  Typically occurs in skin folds. Steroids will likely exacerbate this condition.  Recommend warm water with soap daily.  I asked him to make sure this area remains clean and dry at all times.  He can apply nystatin topical powder 4 times daily.   Plan: Rx 50 mg prednisone x5 days per NCCN guidelines and pharmacist.  Rx sent to pharmacy. Rx triamcinolone cream for spot treatment of rash. Rx Nystatin topical powder to scrotum as needed Pick up Zyrtec or Claritin and take  daily.  Disposition: Scheduled to return to clinic on Friday, 10/15/2018 for consideration of cycle 5 Keytruda. I will confirm with Dr. Janese Banks that this is okay given he will be on steroids.  Visit Diagnosis 1. Maculopapular rash   2. Urothelial carcinoma of distal ureter [facility])     Patient expressed understanding and was in agreement with this plan. He also understands that He can call clinic at any time with any questions, concerns, or complaints.   Greater than 50% was spent in counseling and coordination of care with this patient including but not limited to discussion of the relevant topics above (See A&P) including, but not limited to diagnosis and management of acute and chronic medical conditions.   Thank you for allowing me to participate in the care of this very pleasant patient.   Jacquelin Hawking, NP [place] [facility] - [phone] Pager- [phone] 10/12/2018 11:55 AM   CC: Dr. Janese Banks

## 2018-10-11 NOTE — Telephone Encounter (Signed)
Patient called reporting that he is "broke out on my back and chest, it is itching really bad and interfering with my sleep" Please advise.

## 2018-10-11 NOTE — Telephone Encounter (Signed)
Yeah you can see him

## 2018-10-15 ENCOUNTER — Other Ambulatory Visit: Payer: Self-pay

## 2018-10-15 ENCOUNTER — Inpatient Hospital Stay: Payer: Medicare HMO

## 2018-10-15 ENCOUNTER — Inpatient Hospital Stay (HOSPITAL_BASED_OUTPATIENT_CLINIC_OR_DEPARTMENT_OTHER): Payer: Medicare HMO | Admitting: Nurse Practitioner

## 2018-10-15 ENCOUNTER — Encounter: Payer: Self-pay | Admitting: Oncology

## 2018-10-15 ENCOUNTER — Inpatient Hospital Stay (HOSPITAL_BASED_OUTPATIENT_CLINIC_OR_DEPARTMENT_OTHER): Payer: Medicare HMO | Admitting: Oncology

## 2018-10-15 VITALS — BP 147/87 | HR 83 | Temp 95.9°F | Resp 16 | Ht 72.0 in | Wt 195.9 lb

## 2018-10-15 DIAGNOSIS — C669 Malignant neoplasm of unspecified ureter: Secondary | ICD-10-CM | POA: Diagnosis not present

## 2018-10-15 DIAGNOSIS — Z5112 Encounter for antineoplastic immunotherapy: Secondary | ICD-10-CM

## 2018-10-15 DIAGNOSIS — Z515 Encounter for palliative care: Secondary | ICD-10-CM | POA: Diagnosis not present

## 2018-10-15 DIAGNOSIS — L270 Generalized skin eruption due to drugs and medicaments taken internally: Secondary | ICD-10-CM

## 2018-10-15 LAB — CBC WITH DIFFERENTIAL/PLATELET
Abs Immature Granulocytes: 0.06 10*3/uL (ref 0.00–0.07)
Basophils Absolute: 0 10*3/uL (ref 0.0–0.1)
Basophils Relative: 0 %
Eosinophils Absolute: 0 10*3/uL (ref 0.0–0.5)
Eosinophils Relative: 0 %
HCT: 36.4 % — ABNORMAL LOW (ref 39.0–52.0)
Hemoglobin: 12.7 g/dL — ABNORMAL LOW (ref 13.0–17.0)
Immature Granulocytes: 1 %
Lymphocytes Relative: 19 %
Lymphs Abs: 1.7 10*3/uL (ref 0.7–4.0)
MCH: 29.1 pg (ref 26.0–34.0)
MCHC: 34.9 g/dL (ref 30.0–36.0)
MCV: 83.5 fL (ref 80.0–100.0)
Monocytes Absolute: 0.6 10*3/uL (ref 0.1–1.0)
Monocytes Relative: 7 %
Neutro Abs: 6.6 10*3/uL (ref 1.7–7.7)
Neutrophils Relative %: 73 %
Platelets: 247 10*3/uL (ref 150–400)
RBC: 4.36 MIL/uL (ref 4.22–5.81)
RDW: 13.7 % (ref 11.5–15.5)
WBC: 9 10*3/uL (ref 4.0–10.5)
nRBC: 0 % (ref 0.0–0.2)

## 2018-10-15 LAB — COMPREHENSIVE METABOLIC PANEL
ALT: 21 U/L (ref 0–44)
AST: 27 U/L (ref 15–41)
Albumin: 3.4 g/dL — ABNORMAL LOW (ref 3.5–5.0)
Alkaline Phosphatase: 59 U/L (ref 38–126)
Anion gap: 9 (ref 5–15)
BUN: 23 mg/dL (ref 8–23)
CO2: 26 mmol/L (ref 22–32)
Calcium: 8.9 mg/dL (ref 8.9–10.3)
Chloride: 97 mmol/L — ABNORMAL LOW (ref 98–111)
Creatinine, Ser: 1.13 mg/dL (ref 0.61–1.24)
GFR calc Af Amer: >60 mL/min (ref >60)
GFR calc non Af Amer: 59 mL/min — ABNORMAL LOW (ref >60)
Glucose, Bld: 161 mg/dL — ABNORMAL HIGH (ref 70–99)
Potassium: 3.7 mmol/L (ref 3.5–5.1)
Sodium: 132 mmol/L — ABNORMAL LOW (ref 135–145)
Total Bilirubin: 0.7 mg/dL (ref 0.3–1.2)
Total Protein: 6.7 g/dL (ref 6.5–8.1)

## 2018-10-15 LAB — TSH: TSH: 0.77 u[IU]/mL (ref 0.350–4.500)

## 2018-10-15 MED ORDER — SODIUM CHLORIDE 0.9% FLUSH
10.0000 mL | Freq: Once | INTRAVENOUS | Status: AC
  Administered 2018-10-15: 09:00:00 10 mL via INTRAVENOUS
  Filled 2018-10-15: qty 10

## 2018-10-15 MED ORDER — PREDNISONE 50 MG PO TABS: ORAL_TABLET | ORAL | 0 refills | Status: DC

## 2018-10-15 MED ORDER — HEPARIN SOD (PORK) LOCK FLUSH 100 UNIT/ML IV SOLN
500.0000 [IU] | Freq: Once | INTRAVENOUS | Status: AC
  Administered 2018-10-15: 500 [IU] via INTRAVENOUS
  Filled 2018-10-15: qty 5

## 2018-10-15 NOTE — Progress Notes (Signed)
Skin rash is getting better, he is still using cream and the nystatin. He takes prednisone in the evening. He gets hot after taking steroids and he goes to bathroom in middle of night.

## 2018-10-15 NOTE — Progress Notes (Signed)
[facility]  [phone] [phone]   Name: Anthony Gonzales Date: 10/15/2018 MRN: [id number]  DOB: [birth year]  Patient Care Team: Idelle Crouch, MD as PCP - General (Internal Medicine)    REASON FOR CONSULTATION: Palliative Care consult requested for this 85 y.o. male with multiple medical problems including metastatic urothelial carcinoma with possible mets to the obturator node, indeterminate hilar lymph nodes, and left upper lobe lung nodule.  Patient is currently on treatment with second line Keytruda after not being able to tolerate carboplatinum/Gemzar.  He is status post TURBT.  He has had severe penile pain but is markedly improved following removal of ureteral stent.  He was referred to palliative care to help address goals.   SOCIAL HISTORY:     reports that he has never smoked. He has never used smokeless tobacco. He reports that he does not drink alcohol or use drugs.   Patient is married and lives at home with his wife of 38 years.  He has no children.  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  ADVANCE DIRECTIVES:  On file  CODE STATUS: DNR (MOST form completed on 09/24/18)  PAST MEDICAL HISTORY: Past Medical History:  Diagnosis Date   Arthritis    Benign fibroma of prostate 08/23/2013   BPH (benign prostatic hyperplasia)    Calculus of kidney 08/23/2013   Glaucoma    no drops in 3 mo pressure good, pt denies glaucoma, eye pressure has been measuring alright.   History of kidney stones    HLD (hyperlipidemia)    HOH (hard of hearing)    Left Hearing Aid   HTN (hypertension) 12/26/2014   Hypertension    Hyponatremia 12/26/2014   Migraines    history of migraines when he was younger.   Restless leg syndrome    Sinus drainage    Skin cancer    Urothelial cancer ([place])    chemo tx's.   UTI (lower urinary tract infection) 12/26/2014   Vertigo      PAST SURGICAL HISTORY:  Past Surgical History:  Procedure Laterality Date   COLONOSCOPY     CYSTOSCOPY W/ RETROGRADES Right 01/30/2015   Procedure: CYSTOSCOPY WITH RETROGRADE PYELOGRAM;  Surgeon: [name], MD;  Location: [facility];  Service: Urology;  Laterality: Right;   CYSTOSCOPY W/ RETROGRADES Bilateral 02/26/2016   Procedure: CYSTOSCOPY WITH RETROGRADE PYELOGRAM;  Surgeon: [name], MD;  Location: [facility];  Service: Urology;  Laterality: Bilateral;   CYSTOSCOPY W/ URETERAL STENT PLACEMENT Right 08/20/2015   Procedure: CYSTOSCOPY WITH RETROGRADE PYELOGRAM/POSSIBLE URETERAL STENT PLACEMENT/BLADDER BIOPSY;  Surgeon: [name], MD;  Location: [facility];  Service: Urology;  Laterality: Right;   CYSTOSCOPY W/ URETERAL STENT PLACEMENT Right 09/12/2015   Procedure: CYSTOSCOPY WITH STENT REPLACEMENT;  Surgeon: [name], MD;  Location: [facility];  Service: Urology;  Laterality: Right;   CYSTOSCOPY WITH BIOPSY Right 09/12/2015   Procedure: CYSTOSCOPY WITH BLADDER AND URETERAL BIOPSY;  Surgeon: [name], MD;  Location: [facility];  Service: Urology;  Laterality: Right;   CYSTOSCOPY WITH STENT PLACEMENT Right 01/30/2015   Procedure: CYSTOSCOPY WITH STENT PLACEMENT;  Surgeon: [name], MD;  Location: [facility];  Service: Urology;  Laterality: Right;   CYSTOSCOPY WITH STENT PLACEMENT Right 12/28/2017   Procedure: CYSTOSCOPY WITH STENT Exchange;  Surgeon: [name], MD;  Location: [facility];  Service: Urology;  Laterality: Right;   CYSTOSCOPY/URETEROSCOPY/HOLMIUM LASER/STENT PLACEMENT Right 08/19/2017   Procedure: CYSTOSCOPY/URETEROSCOPY/HOLMIUM LASER/STENT  PLACEMENT;  Surgeon: [name], MD;  Location: [facility];  Service: Urology;  Laterality: Right;   EYE SURGERY Bilateral    Cataract Extraction with IOL   goiter removal     HOLMIUM LASER APPLICATION N/A 07/16/3265   Procedure:  HOLMIUM LASER APPLICATION;  Surgeon: [name], MD;  Location: [facility];  Service: Urology;  Laterality: N/A;   PORTA CATH INSERTION N/A 09/23/2017   Procedure: PORTA CATH INSERTION;  Surgeon: Algernon Huxley, MD;  Location: [facility];  Service: Cardiovascular;  Laterality: N/A;   SPERMATOCELECTOMY     TONSILLECTOMY     TRANSURETHRAL RESECTION OF BLADDER TUMOR N/A 02/26/2016   Procedure: TRANSURETHRAL RESECTION OF BLADDER TUMOR (TURBT);  Surgeon: [name], MD;  Location: [facility];  Service: Urology;  Laterality: N/A;   TRANSURETHRAL RESECTION OF BLADDER TUMOR WITH MITOMYCIN-C N/A 09/12/2015   Procedure: TRANSURETHRAL RESECTION OF BLADDER TUMOR ;  Surgeon: [name], MD;  Location: [facility];  Service: Urology;  Laterality: N/A;   TRANSURETHRAL RESECTION OF BLADDER TUMOR WITH MITOMYCIN-C N/A 03/24/2016   Procedure: TRANSURETHRAL RESECTION OF BLADDER TUMOR WITH MITOMYCIN-C  (SMALL);  Surgeon: [name], MD;  Location: [facility];  Service: Urology;  Laterality: N/A;   URETERAL BIOPSY Right 08/19/2017   Procedure: Renal Mass BIOPSY;  Surgeon: [name], MD;  Location: [facility];  Service: Urology;  Laterality: Right;   URETEROSCOPY Right 01/30/2015   Procedure: URETEROSCOPY/ WITH BIOPSY AND CYTOLOGY BRUSHING;  Surgeon: [name], MD;  Location: [facility];  Service: Urology;  Laterality: Right;   URETEROSCOPY Right 08/20/2015   Procedure: URETEROSCOPY;  Surgeon: [name], MD;  Location: [facility];  Service: Urology;  Laterality: Right;   URETEROSCOPY Right 09/12/2015   Procedure: URETEROSCOPY;  Surgeon: [name], MD;  Location: [facility];  Service: Urology;  Laterality: Right;   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[place])  09/15/2017 Cancer Staging   Staging form: Renal Pelvis and Ureter, AJCC 8th Edition - Clinical stage  from 09/15/2017: Stage IV (cT2, cN0, cM1) - Signed by Sindy Guadeloupe, MD on 09/17/2017   09/17/2017 Initial Diagnosis   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[place])   09/22/2017 - 06/07/2018 Chemotherapy   The patient had palonosetron (ALOXI) injection 0.25 mg, 0.25 mg, Intravenous,  Once, 9 of 10 cycles Administration: 0.25 mg (09/22/2017), 0.25 mg (10/06/2017), 0.25 mg (10/20/2017), 0.25 mg (11/03/2017), 0.25 mg (11/17/2017), 0.25 mg (12/01/2017), 0.25 mg (12/15/2017), 0.25 mg (12/29/2017), 0.25 mg (01/12/2018), 0.25 mg (01/26/2018), 0.25 mg (02/09/2018), 0.25 mg (02/23/2018), 0.25 mg (03/16/2018), 0.25 mg (04/13/2018), 0.25 mg (05/11/2018) pegfilgrastim-cbqv (UDENYCA) injection 6 mg, 6 mg, Subcutaneous, Once, 5 of 6 cycles Administration: 6 mg (01/13/2018), 6 mg (02/10/2018), 6 mg (03/17/2018), 6 mg (04/15/2018), 6 mg (05/12/2018), 6 mg (05/26/2018) CARBOplatin (PARAPLATIN) 160 mg in sodium chloride 0.9 % 250 mL chemo infusion, 160 mg (100 % of original dose 163.2 mg), Intravenous,  Once, 9 of 10 cycles Dose modification:   (original dose 163.2 mg, Cycle 1) Administration: 160 mg (09/22/2017), 170 mg (10/06/2017), 170 mg (10/20/2017), 170 mg (11/17/2017), 160 mg (12/01/2017), 170 mg (11/03/2017), 160 mg (12/15/2017), 160 mg (12/29/2017), 180 mg (01/12/2018), 180 mg (01/26/2018), 160 mg (02/09/2018), 160 mg (02/23/2018), 160 mg (03/16/2018), 160 mg (03/30/2018), 160 mg (04/13/2018), 180 mg (04/27/2018), 180 mg (05/11/2018), 160 mg (05/25/2018) gemcitabine (  GEMZAR) 1,600 mg in sodium chloride 0.9 % 250 mL chemo infusion, 1,672 mg (100 % of original dose 800 mg/m2), Intravenous,  Once, 9 of 10 cycles Dose modification: 800 mg/m2 (original dose 800 mg/m2, Cycle 1, Reason: Patient Age) Administration: 1,600 mg (09/22/2017), 1,600 mg (10/06/2017), 1,600 mg (10/20/2017), 1,600 mg (11/03/2017), 1,600 mg (11/17/2017), 1,600 mg (12/01/2017), 1,600 mg (12/15/2017), 1,600 mg (12/29/2017), 1,600 mg (01/12/2018), 1,600 mg (01/26/2018), 1,600 mg (02/09/2018), 1,600 mg  (02/23/2018), 1,600 mg (03/16/2018), 1,600 mg (03/30/2018), 1,600 mg (04/13/2018), 1,600 mg (04/27/2018), 1,600 mg (05/11/2018), 1,600 mg (05/25/2018)  for chemotherapy treatment.    06/08/2018 -  Chemotherapy   The patient had pembrolizumab (KEYTRUDA) 200 mg in sodium chloride 0.9 % 50 mL chemo infusion, 200 mg, Intravenous, Once, 6 of 7 cycles Administration: 200 mg (06/08/2018), 200 mg (06/29/2018), 200 mg (07/20/2018), 200 mg (08/13/2018), 200 mg (09/03/2018), 200 mg (09/24/2018)  for chemotherapy treatment.      ALLERGIES:  is allergic to demerol [meperidine]; lipitor [atorvastatin]; and sulfa antibiotics.  MEDICATIONS:  Current Outpatient Medications  Medication Sig Dispense Refill   acetaminophen (TYLENOL) 500 MG tablet Take 1,000 mg by mouth daily as needed for moderate pain.      amLODipine (NORVASC) 5 MG tablet Take 5 mg by mouth daily.      benazepril-hydrochlorthiazide (LOTENSIN HCT) 20-12.5 MG tablet Take 1 tablet by mouth daily.      diazepam (VALIUM) 5 MG tablet TAKE 1 TABLET (5 MG TOTAL) BY MOUTH EVERY 12 (TWELVE) HOURS AS NEEDED FOR ANXIETY OR SLEEP     docusate sodium (COLACE) 100 MG capsule Take 100 mg by mouth 2 (two) times daily.      fenofibrate micronized (LOFIBRA) 134 MG capsule Take 134 mg by mouth daily before breakfast.      fluticasone (FLONASE) 50 MCG/ACT nasal spray Place 2 sprays into both nostrils at bedtime.     HYDROcodone-acetaminophen (NORCO/VICODIN) 5-325 MG tablet Take 1 tablet by mouth every 6 (six) hours as needed for severe pain. 30 tablet 0   hydrocortisone cream 1 % Apply 1 application topically daily as needed for itching.     mirabegron ER (MYRBETRIQ) 50 MG TB24 tablet Take 1 tablet (50 mg total) by mouth daily. 30 tablet 11   Multiple Vitamin (MULTIVITAMIN WITH MINERALS) TABS tablet Take 1 tablet by mouth daily.     nystatin (MYCOSTATIN/NYSTOP) powder Apply topically 4 (four) times daily. 15 g 0   Potassium 99 MG TABS Take 99 mg by mouth at  bedtime.     predniSONE (DELTASONE) 50 MG tablet Take for the next 5 days 5 tablet 0   Probiotic Product (PROBIOTIC-10 PO) Take by mouth every morning.     psyllium (METAMUCIL) 58.6 % packet Take 1 packet by mouth daily.      tamsulosin (FLOMAX) 0.4 MG CAPS capsule Take 1 capsule (0.4 mg total) by mouth daily. 90 capsule 3   triamcinolone ointment (KENALOG) 0.5 % Apply 1 application topically 2 (two) times daily. 30 g 0   No current facility-administered medications for this visit.    Facility-Administered Medications Ordered in Other Visits  Medication Dose Route Frequency Provider Last Rate Last Dose   heparin lock flush 100 unit/mL  500 Units Intravenous Once Sindy Guadeloupe, MD        VITAL SIGNS: There were no vitals taken for this visit. There were no vitals filed for this visit.  Estimated body mass index is 26.57 kg/m as calculated from the following:   Height  as of an earlier encounter on 10/15/18: 6' (1.829 m).   Weight as of an earlier encounter on 10/15/18: 195 lb 14.4 oz (88.9 kg).  LABS: CBC:    Component Value Date/Time   WBC 9.0 10/15/2018 0905   HGB 12.7 (L) 10/15/2018 0905   HGB 14.1 09/04/2017 0949   HCT 36.4 (L) 10/15/2018 0905   HCT 40.5 09/04/2017 0949   PLT 247 10/15/2018 0905   PLT 282 09/04/2017 0949   MCV 83.5 10/15/2018 0905   MCV 86 09/04/2017 0949   MCV 88 03/23/2013 0418   NEUTROABS 6.6 10/15/2018 0905   NEUTROABS 4.6 09/04/2017 0949   NEUTROABS 2.8 03/23/2013 0418   LYMPHSABS 1.7 10/15/2018 0905   LYMPHSABS 1.9 09/04/2017 0949   LYMPHSABS 1.2 03/23/2013 0418   MONOABS 0.6 10/15/2018 0905   MONOABS 0.8 03/23/2013 0418   EOSABS 0.0 10/15/2018 0905   EOSABS 0.3 09/04/2017 0949   EOSABS 0.1 03/23/2013 0418   BASOSABS 0.0 10/15/2018 0905   BASOSABS 0.0 09/04/2017 0949   BASOSABS 0.0 03/23/2013 0418   Comprehensive Metabolic Panel:    Component Value Date/Time   NA 132 (L) 10/15/2018 0905   NA 140 03/23/2013 0418   K 3.7 10/15/2018  0905   K 3.8 03/23/2013 0418   CL 97 (L) 10/15/2018 0905   CL 111 (H) 03/23/2013 0418   CO2 26 10/15/2018 0905   CO2 24 03/23/2013 0418   BUN 23 10/15/2018 0905   BUN 24 (H) 03/23/2013 0418   CREATININE 1.13 10/15/2018 0905   CREATININE 1.79 (H) 03/23/2013 0418   GLUCOSE 161 (H) 10/15/2018 0905   GLUCOSE 98 03/23/2013 0418   CALCIUM 8.9 10/15/2018 0905   CALCIUM 8.0 (L) 03/23/2013 0418   AST 27 10/15/2018 0905   AST 40 (H) 03/22/2013 0451   ALT 21 10/15/2018 0905   ALT 28 03/22/2013 0451   ALKPHOS 59 10/15/2018 0905   ALKPHOS 32 (L) 03/22/2013 0451   BILITOT 0.7 10/15/2018 0905   BILITOT 0.3 03/22/2013 0451   PROT 6.7 10/15/2018 0905   PROT 5.5 (L) 03/22/2013 0451   ALBUMIN 3.4 (L) 10/15/2018 0905   ALBUMIN 2.7 (L) 03/22/2013 0451    RADIOGRAPHIC STUDIES: Ct Chest W Contrast  Result Date: 09/17/2018 CLINICAL DATA:  History of bladder cancer, now with right upper tract urothelial neoplasm and suspected right lung metastasis. EXAM: CT CHEST, ABDOMEN, AND PELVIS WITH CONTRAST TECHNIQUE: Multidetector CT imaging of the chest, abdomen and pelvis was performed following the standard protocol during bolus administration of intravenous contrast. CONTRAST:  174m OMNIPAQUE IOHEXOL 300 MG/ML  SOLN COMPARISON:  06/02/2018. FINDINGS: CT CHEST FINDINGS Cardiovascular: The heart is normal in size. No pericardial effusion. No evidence of thoracic aortic aneurysm. Atherosclerotic calcifications of the aortic arch. Coronary atherosclerosis of the LAD. Right chest port terminates in the mid SVC. Mediastinum/Nodes: No suspicious mediastinal lymphadenopathy. 7 mm short axis right hilar node, new, although within normal limits. Visualized thyroid is unremarkable. Lungs/Pleura: 14 x 11 mm nodule in the medial right upper lobe (series 3/image 83), previously 13 x 11 mm, grossly unchanged. 5 mm subpleural nodule in the superior segment left lower lobe (series 3/image 91), previously 10 mm. Adjacent 4 mm  subpleural nodule in the superior segment left lower lobe (series 3/image 90), previously 3 mm. Additional 2-3 mm left lower lobe nodules (series 3/images 111, 125, and 138), unchanged. 3 mm left upper lobe nodule (series 3/image 70), unchanged. 3 mm right lower lobe nodule (series 3/image 135), unchanged. Mild  compressive atelectasis in the medial right lower lobe. No focal consolidation. No pleural effusion or pneumothorax. Musculoskeletal: Degenerative changes of the thoracic spine. CT ABDOMEN PELVIS FINDINGS Hepatobiliary: Liver is within normal limits. Cholelithiasis, without associated inflammatory changes. No intrahepatic or extrahepatic ductal dilatation. Pancreas: 2.0 cm fluid density lesion along the inferior aspect of the uncinate process (series 2/image 86), unchanged, favoring a benign pseudocyst or side branch IPMN. Spleen: Within normal limits. Adrenals/Urinary Tract: Adrenal glands are within normal limits. Bilateral renal cysts, measuring 4.3 cm in the right lower kidney (series 9/image 29) and 8 mm in the left upper kidney (series 9/image 19), benign (Bosniak I). No enhancing renal lesions. Urothelial thickening in the right upper pole collecting system, corresponding to the patient's known upper tract disease, measuring approximately 1.5 x 3.2 cm (series 9/image 21). This is difficult to discretely measure and compare across multiple studies. Additional 13 mm enhancing urothelial lesion inferiorly in the right renal collecting system (series 9/image 25), also difficult to discretely measure and compare. A hematuria study was not performed, and as such, delays were not continued into the pelvis. However, additional filling defects are suspected in the proximal/mid ureter which were not definitely present on the prior (series 9/images 33 and 36), raising concern for additional sites of urothelial tumor. 4 mm hyperdense/enhancing lesion at the right ureteral orifice/UVJ (series 2/image 125) may  reflect an additional site of tumor, poorly evaluated. Bladder is mildly thick-walled but underdistended. Stomach/Bowel: Stomach is within normal limits. No evidence of bowel obstruction. Normal appendix (series 2/image 99). Sigmoid diverticulosis, without evidence of diverticulitis. Vascular/Lymphatic: No evidence of abdominal aortic aneurysm. Atherosclerotic calcifications of the abdominal aorta and branch vessels. 17 mm short axis right obturator node (series 2/image 119), previously 13 mm when measured in a similar fashion. Reproductive: Prostate is grossly unremarkable. Other: No abdominopelvic ascites. Musculoskeletal: Degenerative changes of the lumbar spine. IMPRESSION: Two urothelial lesions in the right renal collecting system are similar but difficult to directly compare. Possible new filling defects in the proximal/mid right ureter, poorly evaluated on this non hematuria study. Additional possible 4 mm lesion at the right ureteral orifice/UVJ is also poorly evaluated. 17 mm short axis right obturator node, compatible with nodal metastasis, mildly progressive. 5 mm left lower lobe nodule, previously 10 mm, favoring improving metastasis. Additional pulmonary nodules are grossly unchanged, including the dominant 14 mm right upper lobe nodule, indeterminate. 2.0 cm unilocular cystic lesion in the pancreatic uncinate process, favoring a benign pseudocyst or side branch IPMN, of questionable clinical significance in this patient. Attention on follow-up is likely satisfactory. Electronically Signed   By: Julian Hy M.D.   On: 09/17/2018 22:18   Ct Abdomen Pelvis W Contrast  Result Date: 09/17/2018 CLINICAL DATA:  History of bladder cancer, now with right upper tract urothelial neoplasm and suspected right lung metastasis. EXAM: CT CHEST, ABDOMEN, AND PELVIS WITH CONTRAST TECHNIQUE: Multidetector CT imaging of the chest, abdomen and pelvis was performed following the standard protocol during bolus  administration of intravenous contrast. CONTRAST:  143m OMNIPAQUE IOHEXOL 300 MG/ML  SOLN COMPARISON:  06/02/2018. FINDINGS: CT CHEST FINDINGS Cardiovascular: The heart is normal in size. No pericardial effusion. No evidence of thoracic aortic aneurysm. Atherosclerotic calcifications of the aortic arch. Coronary atherosclerosis of the LAD. Right chest port terminates in the mid SVC. Mediastinum/Nodes: No suspicious mediastinal lymphadenopathy. 7 mm short axis right hilar node, new, although within normal limits. Visualized thyroid is unremarkable. Lungs/Pleura: 14 x 11 mm nodule in the medial right upper lobe (  series 3/image 83), previously 13 x 11 mm, grossly unchanged. 5 mm subpleural nodule in the superior segment left lower lobe (series 3/image 91), previously 10 mm. Adjacent 4 mm subpleural nodule in the superior segment left lower lobe (series 3/image 90), previously 3 mm. Additional 2-3 mm left lower lobe nodules (series 3/images 111, 125, and 138), unchanged. 3 mm left upper lobe nodule (series 3/image 70), unchanged. 3 mm right lower lobe nodule (series 3/image 135), unchanged. Mild compressive atelectasis in the medial right lower lobe. No focal consolidation. No pleural effusion or pneumothorax. Musculoskeletal: Degenerative changes of the thoracic spine. CT ABDOMEN PELVIS FINDINGS Hepatobiliary: Liver is within normal limits. Cholelithiasis, without associated inflammatory changes. No intrahepatic or extrahepatic ductal dilatation. Pancreas: 2.0 cm fluid density lesion along the inferior aspect of the uncinate process (series 2/image 86), unchanged, favoring a benign pseudocyst or side branch IPMN. Spleen: Within normal limits. Adrenals/Urinary Tract: Adrenal glands are within normal limits. Bilateral renal cysts, measuring 4.3 cm in the right lower kidney (series 9/image 29) and 8 mm in the left upper kidney (series 9/image 19), benign (Bosniak I). No enhancing renal lesions. Urothelial thickening in  the right upper pole collecting system, corresponding to the patient's known upper tract disease, measuring approximately 1.5 x 3.2 cm (series 9/image 21). This is difficult to discretely measure and compare across multiple studies. Additional 13 mm enhancing urothelial lesion inferiorly in the right renal collecting system (series 9/image 25), also difficult to discretely measure and compare. A hematuria study was not performed, and as such, delays were not continued into the pelvis. However, additional filling defects are suspected in the proximal/mid ureter which were not definitely present on the prior (series 9/images 33 and 36), raising concern for additional sites of urothelial tumor. 4 mm hyperdense/enhancing lesion at the right ureteral orifice/UVJ (series 2/image 125) may reflect an additional site of tumor, poorly evaluated. Bladder is mildly thick-walled but underdistended. Stomach/Bowel: Stomach is within normal limits. No evidence of bowel obstruction. Normal appendix (series 2/image 99). Sigmoid diverticulosis, without evidence of diverticulitis. Vascular/Lymphatic: No evidence of abdominal aortic aneurysm. Atherosclerotic calcifications of the abdominal aorta and branch vessels. 17 mm short axis right obturator node (series 2/image 119), previously 13 mm when measured in a similar fashion. Reproductive: Prostate is grossly unremarkable. Other: No abdominopelvic ascites. Musculoskeletal: Degenerative changes of the lumbar spine. IMPRESSION: Two urothelial lesions in the right renal collecting system are similar but difficult to directly compare. Possible new filling defects in the proximal/mid right ureter, poorly evaluated on this non hematuria study. Additional possible 4 mm lesion at the right ureteral orifice/UVJ is also poorly evaluated. 17 mm short axis right obturator node, compatible with nodal metastasis, mildly progressive. 5 mm left lower lobe nodule, previously 10 mm, favoring improving  metastasis. Additional pulmonary nodules are grossly unchanged, including the dominant 14 mm right upper lobe nodule, indeterminate. 2.0 cm unilocular cystic lesion in the pancreatic uncinate process, favoring a benign pseudocyst or side branch IPMN, of questionable clinical significance in this patient. Attention on follow-up is likely satisfactory. Electronically Signed   By: Julian Hy M.D.   On: 09/17/2018 22:18    PERFORMANCE STATUS (ECOG) : 1 - Symptomatic but completely ambulatory  Review of Systems Unless otherwise noted, a complete review of systems is negative.  Physical Exam General: NAD, frail appearing, thin Pulmonary: unlabored Extremities: no edema, no joint deformities Skin: no rashes Neurological: Weakness but otherwise nonfocal  IMPRESSION: I met with patient today in clinic. He saw Dr. Janese Banks  today as well.  Keytruda held due to rash.  He reports that he is doing well.  He was seen in symptom management clinic earlier this week for rash and was started on prednisone, topical triamcinolone, and Zyrtec.  He also had scrotal intertriginous dermatitis thought to be secondary to moisture and was started on nystatin powder and hygiene encouraged.  Today, he says that symptoms are improving and he denies other distressing symptoms.   He says that he is coping well and identifies good family support.  Appetite is good and his weight has increased a couple pounds.  For palliative reasons, stent was removed and no evidence of recurrent hydronephrosis or ureteral extraction though he understands he is high risk for recurrence.  Renal functions normal today.  His pain has resolved since removal of stent.  He has restarted Myrbetriq and Flomax for nocturia.  He has not had to use pain medicine since stent was removed.  Otherwise he says he is doing well and denies any specific needs today.    Cardiopulmonary Resuscitation: Do Not Attempt Resuscitation (DNR/No CPR)  Medical  Interventions: Limited Additional Interventions: Use medical treatment, IV fluids and cardiac monitoring as indicated, DO NOT USE intubation or mechanical ventilation. May consider use of less invasive airway support such as BiPAP or CPAP. Also provide comfort measures. Transfer to the hospital if indicated. Avoid intensive care.   Antibiotics: Antibiotics if indicated  IV Fluids: IV fluids if indicated  Feeding Tube: No feeding tube     PLAN: -Continue current scope of treatment per Dr. Janese Banks - continue prednisone x 3 days; Keytruda held today d/t rash -RTC in 4 weeks for follow up or sooner if symptom management is needed.   Patient expressed understanding and was in agreement with this plan. He also understands that He can call the clinic at any time with any questions, concerns, or complaints.   Time Total: 15 minutes  Visit consisted of counseling and education dealing with the complex and emotionally intense issues of symptom management and palliative care in the setting of serious and potentially life-threatening illness.Greater than 50%  of this time was spent counseling and coordinating care related to the above assessment and plan.  Signed by: Beckey Rutter, DNP, AGNP-C [place] at [facility] [phone] (work cell) [phone] (office)

## 2018-10-18 NOTE — Progress Notes (Signed)
Hematology/Oncology Consult note [facility]  Telephone:([phone] Fax:([phone]  Patient Care Team: Idelle Crouch, MD as PCP - General (Internal Medicine)   Name of the patient: Anthony Gonzales  [id number]  December 11, 1931   Date of visit: 10/18/18  Diagnosis-  Metastatic urothelial carcinoma with possible mets to the obturator node. Indeterminate hilar lymph nodes and LUL lung nodule   Chief complaint/ Reason for visit-on treatment assessment prior to cycle 6 of Keytruda  Heme/Onc history: patient is a 85 year old male with past medical history significant for hypertension and long-standing history of superficial bladder cancer for which he sees [name]. He has undergone TURBT as well as ureteroscopy since 2017 along with mitomycin as well in the past. Most recently he underwent CT abdomen on 08/06/2017 which showed a soft tissue mass in the right renal pelvis concerning for upper tract urothelial neoplasm. Soft tissue fullness at the right ureterovesical junction with proximal right hydroureteronephrosis. Several millimeter attenuation lesion in the pancreatic head.  He underwent diagnostic ureteroscopy and was found to have 2 high-grade lesions within his right kidney. There was a nodular high-grade appearing lesion in the right anterior renal pelvis. There was also a second ureteral tumor fungating from the right ureteral orifice extending into the distal ureter also consistent with high-grade invasive urothelial carcinoma. Muscle invasion could not be assessed. He also underwent a CT chest which showed a rounded nodule in the right upper lobe measuring 14 mm concerning for metastases. 2 other 3 mm lesions were also noted in the left upper lobe likely benign  Plan initially was neoadjuvant chemotherapy followed by possible surgery but given the presence of lung lesion patient has been referred to oncology for the same.  PET/CT on  09/21/17 showed:IMPRESSION: 1. Hypermetabolic right obturator lymph node is most indicative of metastatic disease. 2. Mildly hypermetabolic left hilar lymph nodes are nonspecific. Continued attention on follow-up exams is warranted. 3. Right upper lobe pulmonary nodule shows metabolism after just above blood pool and is therefore indeterminate. Continued attention on follow-up exams is warranted. 4. Aortic atherosclerosis (ICD10-170.0). Coronary artery calcification. 5. Cholelithiasis  Carboplatin/ gemzar1 week on and one-week off as patient could not tolerate 2-week on and one week off regimen. Cycle 1 started on 09/22/2017 Disease progression in March 2020. Switched to second line Keytruda    Interval history-patient was seen in symptom management clinic 3 days ago for generalized maculopapular rash possibly secondary to [facility] and was started on steroids.  Today he states that his rash is much improved and is not as itchy  ECOG PS- 1 Pain scale- 0 Opioid associated constipation- no  Review of systems- Review of Systems  Constitutional: Negative for chills, fever, malaise/fatigue and weight loss.  HENT: Negative for congestion, ear discharge and nosebleeds.   Eyes: Negative for blurred vision.  Respiratory: Negative for cough, hemoptysis, sputum production, shortness of breath and wheezing.   Cardiovascular: Negative for chest pain, palpitations, orthopnea and claudication.  Gastrointestinal: Negative for abdominal pain, blood in stool, constipation, diarrhea, heartburn, melena, nausea and vomiting.  Genitourinary: Negative for dysuria, flank pain, frequency, hematuria and urgency.  Musculoskeletal: Negative for back pain, joint pain and myalgias.  Skin: Positive for rash.  Neurological: Negative for dizziness, tingling, focal weakness, seizures, weakness and headaches.  Endo/Heme/Allergies: Does not bruise/bleed easily.  Psychiatric/Behavioral: Negative for depression  and suicidal ideas. The patient does not have insomnia.        Allergies  Allergen Reactions  . Demerol [Meperidine]  Nausea And Vomiting  . Lipitor [Atorvastatin] Swelling  . Sulfa Antibiotics Nausea And Vomiting and Rash     Past Medical History:  Diagnosis Date  . Arthritis   . Benign fibroma of prostate 08/23/2013  . BPH (benign prostatic hyperplasia)   . Calculus of kidney 08/23/2013  . Glaucoma    no drops in 3 mo pressure good, pt denies glaucoma, eye pressure has been measuring alright.  . History of kidney stones   . HLD (hyperlipidemia)   . HOH (hard of hearing)    Left Hearing Aid  . HTN (hypertension) 12/26/2014  . Hypertension   . Hyponatremia 12/26/2014  . Migraines    history of migraines when he was younger.  [name] Restless leg syndrome   . Sinus drainage   . Skin cancer   . Urothelial cancer ([place])    chemo tx's.  [name] UTI (lower urinary tract infection) 12/26/2014  . Vertigo      Past Surgical History:  Procedure Laterality Date  . COLONOSCOPY    . CYSTOSCOPY W/ RETROGRADES Right 01/30/2015   Procedure: CYSTOSCOPY WITH RETROGRADE PYELOGRAM;  Surgeon: [name], MD;  Location: [facility];  Service: Urology;  Laterality: Right;  . CYSTOSCOPY W/ RETROGRADES Bilateral 02/26/2016   Procedure: CYSTOSCOPY WITH RETROGRADE PYELOGRAM;  Surgeon: [name], MD;  Location: [facility];  Service: Urology;  Laterality: Bilateral;  . CYSTOSCOPY W/ URETERAL STENT PLACEMENT Right 08/20/2015   Procedure: CYSTOSCOPY WITH RETROGRADE PYELOGRAM/POSSIBLE URETERAL STENT PLACEMENT/BLADDER BIOPSY;  Surgeon: [name], MD;  Location: [facility];  Service: Urology;  Laterality: Right;  . CYSTOSCOPY W/ URETERAL STENT PLACEMENT Right 09/12/2015   Procedure: CYSTOSCOPY WITH STENT REPLACEMENT;  Surgeon: [name], MD;  Location: [facility];  Service: Urology;  Laterality: Right;  . CYSTOSCOPY WITH BIOPSY Right 09/12/2015   Procedure: CYSTOSCOPY WITH BLADDER AND URETERAL BIOPSY;   Surgeon: [name], MD;  Location: [facility];  Service: Urology;  Laterality: Right;  . CYSTOSCOPY WITH STENT PLACEMENT Right 01/30/2015   Procedure: CYSTOSCOPY WITH STENT PLACEMENT;  Surgeon: [name], MD;  Location: [facility];  Service: Urology;  Laterality: Right;  . CYSTOSCOPY WITH STENT PLACEMENT Right 12/28/2017   Procedure: [place] WITH STENT Exchange;  Surgeon: [name], MD;  Location: [facility];  Service: Urology;  Laterality: Right;  . CYSTOSCOPY/URETEROSCOPY/HOLMIUM LASER/STENT PLACEMENT Right 08/19/2017   Procedure: CYSTOSCOPY/URETEROSCOPY/HOLMIUM LASER/STENT PLACEMENT;  Surgeon: [name], MD;  Location: [facility];  Service: Urology;  Laterality: Right;  . EYE SURGERY Bilateral    Cataract Extraction with IOL  . goiter removal    . HOLMIUM LASER APPLICATION N/A 2/83/1517   Procedure:  HOLMIUM LASER APPLICATION;  Surgeon: [name], MD;  Location: [facility];  Service: Urology;  Laterality: N/A;  . PORTA CATH INSERTION N/A 09/23/2017   Procedure: PORTA CATH INSERTION;  Surgeon: Algernon Huxley, MD;  Location: [facility];  Service: Cardiovascular;  Laterality: N/A;  . SPERMATOCELECTOMY    . TONSILLECTOMY    . TRANSURETHRAL RESECTION OF BLADDER TUMOR N/A 02/26/2016   Procedure: TRANSURETHRAL RESECTION OF BLADDER TUMOR (TURBT);  Surgeon: [name], MD;  Location: [facility];  Service: Urology;  Laterality: N/A;  . TRANSURETHRAL RESECTION OF BLADDER TUMOR WITH MITOMYCIN-C N/A 09/12/2015   Procedure: TRANSURETHRAL RESECTION OF BLADDER TUMOR ;  Surgeon: [name], MD;  Location: [facility];  Service: Urology;  Laterality: N/A;  . TRANSURETHRAL RESECTION OF BLADDER TUMOR WITH MITOMYCIN-C N/A 03/24/2016   Procedure: TRANSURETHRAL RESECTION OF BLADDER TUMOR WITH MITOMYCIN-C  (SMALL);  Surgeon: [name], MD;  Location: [facility];  Service: Urology;  Laterality: N/A;  . URETERAL BIOPSY Right 08/19/2017   Procedure: Renal Mass BIOPSY;  Surgeon: [name], MD;  Location: [facility];  Service: Urology;  Laterality: Right;  . URETEROSCOPY Right 01/30/2015   Procedure: URETEROSCOPY/ WITH BIOPSY AND CYTOLOGY BRUSHING;  Surgeon: [name], MD;  Location: [facility];  Service: Urology;  Laterality: Right;  . URETEROSCOPY Right 08/20/2015   Procedure: URETEROSCOPY;  Surgeon: [name], MD;  Location: [facility];  Service: Urology;  Laterality: Right;  . URETEROSCOPY Right 09/12/2015   Procedure: URETEROSCOPY;  Surgeon: [name], MD;  Location: [facility];  Service: Urology;  Laterality: Right;  . URETEROSCOPY Right 02/26/2016   Procedure: URETEROSCOPY;  Surgeon: [name], MD;  Location: [facility];  Service: Urology;  Laterality: Right;    Social History   Socioeconomic History  . Marital status: Married    Spouse name: Not on file  . Number of children: Not on file  . Years of education: Not on file  . Highest education level: Not on file  Occupational History  . Not on file  Social Needs  . Financial resource strain: Not on file  . Food insecurity    Worry: Not on file    Inability: Not on file  . Transportation needs    Medical: Not on file    Non-medical: Not on file  Tobacco Use  . Smoking status: Never Smoker  . Smokeless tobacco: Never Used  Substance and Sexual Activity  . Alcohol use: No    Alcohol/week: 0.0 standard drinks  . Drug use: No  . Sexual activity: Not on file  Lifestyle  . Physical activity    Days per week: Not on file    Minutes per session: Not on file  . Stress: Not on file  Relationships  . Social Herbalist on phone: Not on file    Gets together: Not on file    Attends religious service: Not on file    Active member of club or organization: Not on file    Attends meetings of clubs or organizations: Not on file    Relationship status: Not on file  . Intimate partner violence    Fear of current or ex partner: Not on file    Emotionally abused: Not on file    Physically  abused: Not on file    Forced sexual activity: Not on file  Other Topics Concern  . Not on file  Social History Narrative  . Not on file    Family History  Problem Relation Age of Onset  . Hypertension Mother   . Hypertension Father   . Prostate cancer Brother   . Kidney disease Neg Hx   . Kidney cancer Neg Hx   . Bladder Cancer Neg Hx      Current Outpatient Medications:  .  acetaminophen (TYLENOL) 500 MG tablet, Take 1,000 mg by mouth daily as needed for moderate pain. , Disp: , Rfl:  .  amLODipine (NORVASC) 5 MG tablet, Take 5 mg by mouth daily. , Disp: , Rfl:  .  benazepril-hydrochlorthiazide (LOTENSIN HCT) 20-12.5 MG tablet, Take 1 tablet by mouth daily. , Disp: , Rfl:  .  diazepam (VALIUM) 5 MG tablet, TAKE 1 TABLET (5 MG TOTAL) BY MOUTH EVERY 12 (TWELVE) HOURS AS NEEDED FOR ANXIETY OR SLEEP, Disp: , Rfl:  .  docusate sodium (COLACE) 100 MG capsule, Take 100 mg by  mouth 2 (two) times daily. , Disp: , Rfl:  .  fenofibrate micronized (LOFIBRA) 134 MG capsule, Take 134 mg by mouth daily before breakfast. , Disp: , Rfl:  .  fluticasone (FLONASE) 50 MCG/ACT nasal spray, Place 2 sprays into both nostrils at bedtime., Disp: , Rfl:  .  HYDROcodone-acetaminophen (NORCO/VICODIN) 5-325 MG tablet, Take 1 tablet by mouth every 6 (six) hours as needed for severe pain., Disp: 30 tablet, Rfl: 0 .  hydrocortisone cream 1 %, Apply 1 application topically daily as needed for itching., Disp: , Rfl:  .  mirabegron ER (MYRBETRIQ) 50 MG TB24 tablet, Take 1 tablet (50 mg total) by mouth daily., Disp: 30 tablet, Rfl: 11 .  Multiple Vitamin (MULTIVITAMIN WITH MINERALS) TABS tablet, Take 1 tablet by mouth daily., Disp: , Rfl:  .  nystatin (MYCOSTATIN/NYSTOP) powder, Apply topically 4 (four) times daily., Disp: 15 g, Rfl: 0 .  Potassium 99 MG TABS, Take 99 mg by mouth at bedtime., Disp: , Rfl:  .  predniSONE (DELTASONE) 50 MG tablet, Take for the next 5 days, Disp: 4 tablet, Rfl: 0 .  Probiotic Product  (PROBIOTIC-10 PO), Take by mouth every morning., Disp: , Rfl:  .  psyllium (METAMUCIL) 58.6 % packet, Take 1 packet by mouth daily. , Disp: , Rfl:  .  tamsulosin (FLOMAX) 0.4 MG CAPS capsule, Take 1 capsule (0.4 mg total) by mouth daily., Disp: 90 capsule, Rfl: 3 .  triamcinolone ointment (KENALOG) 0.5 %, Apply 1 application topically 2 (two) times daily., Disp: 30 g, Rfl: 0  Physical exam:  Vitals:   10/15/18 0936  BP: (!) 147/87  Pulse: 83  Resp: 16  Temp: (!) 95.9 F (35.5 C)  TempSrc: Tympanic  Weight: 195 lb 14.4 oz (88.9 kg)  Height: 6' (1.829 m)   Physical Exam HENT:     Head: Normocephalic and atraumatic.  Eyes:     Pupils: Pupils are equal, round, and reactive to light.  Neck:     Musculoskeletal: Normal range of motion.  Cardiovascular:     Rate and Rhythm: Normal rate and regular rhythm.     Heart sounds: Normal heart sounds.  Pulmonary:     Effort: Pulmonary effort is normal.     Breath sounds: Normal breath sounds.  Abdominal:     General: Bowel sounds are normal.     Palpations: Abdomen is soft.  Skin:    Comments: Prior areas of maculopapular erythematous rash appear less red today and appears to be healing.  No new areas of rash noted  Neurological:     Mental Status: He is alert and oriented to person, place, and time.      CMP Latest Ref Rng & Units 10/15/2018  Glucose 70 - 99 mg/dL 161(H)  BUN 8 - 23 mg/dL 23  Creatinine 0.61 - 1.24 mg/dL 1.13  Sodium 135 - 145 mmol/L 132(L)  Potassium 3.5 - 5.1 mmol/L 3.7  Chloride 98 - 111 mmol/L 97(L)  CO2 22 - 32 mmol/L 26  Calcium 8.9 - 10.3 mg/dL 8.9  Total Protein 6.5 - 8.1 g/dL 6.7  Total Bilirubin 0.3 - 1.2 mg/dL 0.7  Alkaline Phos 38 - 126 U/L 59  AST 15 - 41 U/L 27  ALT 0 - 44 U/L 21   CBC Latest Ref Rng & Units 10/15/2018  WBC 4.0 - 10.5 K/uL 9.0  Hemoglobin 13.0 - 17.0 g/dL 12.7(L)  Hematocrit 39.0 - 52.0 % 36.4(L)  Platelets 150 - 400 K/uL 247  Assessment and plan- Patient is a 85 y.o.  male  with metastatic urothelial carcinoma stage IV cT2 N0 M1 with metastases to the obturator lymph node as well as hilar adenopathy and right upper lobe lung nodule.  He is here for on treatment assessment prior to cycle 6 of Keytruda  I will hold off on giving him Keytruda for few more days and he will return to clinic on 10/19/2018 to receive his next cycle of Keytruda.  I will see him 3 weeks from that day for cycle 7 with CBC with differential, CMP.  Skin rash: Possibly secondary to [facility]  No new exposures to any allergens lotions or soaps.  I will have him continue his steroids for 3 more days and I have renewed his prednisone prescription as well.   Visit Diagnosis 1. Encounter for antineoplastic immunotherapy   2. Drug rash   3. 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 Randa Evens, MD, MPH [facility] at [facility] [phone] 10/18/2018 8:52 AM

## 2018-10-19 ENCOUNTER — Inpatient Hospital Stay: Payer: Medicare HMO

## 2018-10-19 ENCOUNTER — Other Ambulatory Visit: Payer: Self-pay

## 2018-10-19 VITALS — BP 158/74 | HR 61 | Temp 97.1°F | Wt 192.4 lb

## 2018-10-19 DIAGNOSIS — Z5112 Encounter for antineoplastic immunotherapy: Secondary | ICD-10-CM | POA: Diagnosis not present

## 2018-10-19 DIAGNOSIS — C669 Malignant neoplasm of unspecified ureter: Secondary | ICD-10-CM

## 2018-10-19 MED ORDER — SODIUM CHLORIDE 0.9 % IV SOLN
200.0000 mg | Freq: Once | INTRAVENOUS | Status: AC
  Administered 2018-10-19: 200 mg via INTRAVENOUS
  Filled 2018-10-19: qty 8

## 2018-10-19 MED ORDER — SODIUM CHLORIDE 0.9 % IV SOLN
Freq: Once | INTRAVENOUS | Status: AC
  Administered 2018-10-19: 10:00:00 via INTRAVENOUS
  Filled 2018-10-19: qty 250

## 2018-10-19 MED ORDER — HEPARIN SOD (PORK) LOCK FLUSH 100 UNIT/ML IV SOLN
500.0000 [IU] | Freq: Once | INTRAVENOUS | Status: AC | PRN
  Administered 2018-10-19: 500 [IU]
  Filled 2018-10-19: qty 5

## 2018-10-22 ENCOUNTER — Other Ambulatory Visit: Payer: Self-pay | Admitting: Oncology

## 2018-10-26 ENCOUNTER — Ambulatory Visit: Payer: Medicare HMO

## 2018-10-28 ENCOUNTER — Other Ambulatory Visit: Payer: Self-pay | Admitting: Oncology

## 2018-10-28 ENCOUNTER — Telehealth: Payer: Self-pay | Admitting: *Deleted

## 2018-10-28 MED ORDER — PREDNISONE 50 MG PO TABS: ORAL_TABLET | ORAL | 0 refills | Status: DC

## 2018-10-28 NOTE — Telephone Encounter (Signed)
Give him another steroid course and I will decide what to do about [place]

## 2018-10-28 NOTE — Telephone Encounter (Signed)
Patient called and reports that he is braking out again, not as bad as the last time. He would like to have a return call regarding this [phone]

## 2018-10-28 NOTE — Telephone Encounter (Signed)
Ok. I will call now.

## 2018-10-28 NOTE — Progress Notes (Signed)
Patient called clinic complaining of continued/worseing dermatitis.  He is currently on every 3-week Keytruda.  He is followed by Dr. Janese Banks.  He was evaluated in [facility] earlier this month for rash and given RX prednisone 50 mg for 5 days and steroid cream.  Symptoms improved "some" and have unfortunately returned.  I spoke with Dr. Janese Banks and she agrees with additional oral steroids. RX prednisone 50 mg daily x7 days. Continue steroid cream as needed.  RTC as scheduled for reevaluation.   Faythe Casa, NP 10/28/2018 11:00 AM

## 2018-11-09 ENCOUNTER — Other Ambulatory Visit: Payer: Self-pay | Admitting: *Deleted

## 2018-11-09 ENCOUNTER — Inpatient Hospital Stay: Payer: Medicare HMO | Attending: Oncology | Admitting: *Deleted

## 2018-11-09 ENCOUNTER — Inpatient Hospital Stay (HOSPITAL_BASED_OUTPATIENT_CLINIC_OR_DEPARTMENT_OTHER): Payer: Medicare HMO | Admitting: Oncology

## 2018-11-09 ENCOUNTER — Other Ambulatory Visit: Payer: Self-pay

## 2018-11-09 ENCOUNTER — Inpatient Hospital Stay: Payer: Medicare HMO

## 2018-11-09 VITALS — BP 113/62 | HR 58 | Temp 97.7°F | Resp 16 | Wt 190.7 lb

## 2018-11-09 DIAGNOSIS — K8020 Calculus of gallbladder without cholecystitis without obstruction: Secondary | ICD-10-CM | POA: Diagnosis not present

## 2018-11-09 DIAGNOSIS — M1990 Unspecified osteoarthritis, unspecified site: Secondary | ICD-10-CM | POA: Diagnosis not present

## 2018-11-09 DIAGNOSIS — L308 Other specified dermatitis: Secondary | ICD-10-CM

## 2018-11-09 DIAGNOSIS — C3411 Malignant neoplasm of upper lobe, right bronchus or lung: Secondary | ICD-10-CM | POA: Insufficient documentation

## 2018-11-09 DIAGNOSIS — Z5112 Encounter for antineoplastic immunotherapy: Secondary | ICD-10-CM | POA: Diagnosis present

## 2018-11-09 DIAGNOSIS — C669 Malignant neoplasm of unspecified ureter: Secondary | ICD-10-CM

## 2018-11-09 DIAGNOSIS — N400 Enlarged prostate without lower urinary tract symptoms: Secondary | ICD-10-CM | POA: Diagnosis not present

## 2018-11-09 DIAGNOSIS — L270 Generalized skin eruption due to drugs and medicaments taken internally: Secondary | ICD-10-CM | POA: Diagnosis not present

## 2018-11-09 DIAGNOSIS — R21 Rash and other nonspecific skin eruption: Secondary | ICD-10-CM | POA: Diagnosis not present

## 2018-11-09 DIAGNOSIS — R590 Localized enlarged lymph nodes: Secondary | ICD-10-CM | POA: Diagnosis not present

## 2018-11-09 DIAGNOSIS — I700 Atherosclerosis of aorta: Secondary | ICD-10-CM | POA: Diagnosis not present

## 2018-11-09 DIAGNOSIS — Z85828 Personal history of other malignant neoplasm of skin: Secondary | ICD-10-CM | POA: Diagnosis not present

## 2018-11-09 DIAGNOSIS — Z8249 Family history of ischemic heart disease and other diseases of the circulatory system: Secondary | ICD-10-CM | POA: Diagnosis not present

## 2018-11-09 DIAGNOSIS — Z87442 Personal history of urinary calculi: Secondary | ICD-10-CM | POA: Insufficient documentation

## 2018-11-09 DIAGNOSIS — Z8042 Family history of malignant neoplasm of prostate: Secondary | ICD-10-CM | POA: Diagnosis not present

## 2018-11-09 DIAGNOSIS — R5383 Other fatigue: Secondary | ICD-10-CM | POA: Insufficient documentation

## 2018-11-09 DIAGNOSIS — I2510 Atherosclerotic heart disease of native coronary artery without angina pectoris: Secondary | ICD-10-CM | POA: Insufficient documentation

## 2018-11-09 DIAGNOSIS — T451X5A Adverse effect of antineoplastic and immunosuppressive drugs, initial encounter: Secondary | ICD-10-CM | POA: Diagnosis not present

## 2018-11-09 DIAGNOSIS — Z882 Allergy status to sulfonamides status: Secondary | ICD-10-CM | POA: Diagnosis not present

## 2018-11-09 DIAGNOSIS — Z885 Allergy status to narcotic agent status: Secondary | ICD-10-CM | POA: Insufficient documentation

## 2018-11-09 DIAGNOSIS — Z79899 Other long term (current) drug therapy: Secondary | ICD-10-CM | POA: Diagnosis not present

## 2018-11-09 DIAGNOSIS — Z95828 Presence of other vascular implants and grafts: Secondary | ICD-10-CM

## 2018-11-09 LAB — CBC WITH DIFFERENTIAL/PLATELET
Abs Immature Granulocytes: 0.04 10*3/uL (ref 0.00–0.07)
Basophils Absolute: 0.1 10*3/uL (ref 0.0–0.1)
Basophils Relative: 1 %
Eosinophils Absolute: 0.4 10*3/uL (ref 0.0–0.5)
Eosinophils Relative: 7 %
HCT: 37 % — ABNORMAL LOW (ref 39.0–52.0)
Hemoglobin: 12.8 g/dL — ABNORMAL LOW (ref 13.0–17.0)
Immature Granulocytes: 1 %
Lymphocytes Relative: 24 %
Lymphs Abs: 1.5 10*3/uL (ref 0.7–4.0)
MCH: 29.6 pg (ref 26.0–34.0)
MCHC: 34.6 g/dL (ref 30.0–36.0)
MCV: 85.6 fL (ref 80.0–100.0)
Monocytes Absolute: 0.7 10*3/uL (ref 0.1–1.0)
Monocytes Relative: 11 %
Neutro Abs: 3.6 10*3/uL (ref 1.7–7.7)
Neutrophils Relative %: 56 %
Platelets: 228 10*3/uL (ref 150–400)
RBC: 4.32 MIL/uL (ref 4.22–5.81)
RDW: 14.5 % (ref 11.5–15.5)
WBC: 6.3 10*3/uL (ref 4.0–10.5)
nRBC: 0 % (ref 0.0–0.2)

## 2018-11-09 LAB — COMPREHENSIVE METABOLIC PANEL
ALT: 21 U/L (ref 0–44)
AST: 27 U/L (ref 15–41)
Albumin: 3.5 g/dL (ref 3.5–5.0)
Alkaline Phosphatase: 46 U/L (ref 38–126)
Anion gap: 6 (ref 5–15)
BUN: 26 mg/dL — ABNORMAL HIGH (ref 8–23)
CO2: 30 mmol/L (ref 22–32)
Calcium: 8.6 mg/dL — ABNORMAL LOW (ref 8.9–10.3)
Chloride: 98 mmol/L (ref 98–111)
Creatinine, Ser: 1.35 mg/dL — ABNORMAL HIGH (ref 0.61–1.24)
GFR calc Af Amer: 55 mL/min — ABNORMAL LOW (ref >60)
GFR calc non Af Amer: 48 mL/min — ABNORMAL LOW (ref >60)
Glucose, Bld: 92 mg/dL (ref 70–99)
Potassium: 4 mmol/L (ref 3.5–5.1)
Sodium: 134 mmol/L — ABNORMAL LOW (ref 135–145)
Total Bilirubin: 0.8 mg/dL (ref 0.3–1.2)
Total Protein: 6.1 g/dL — ABNORMAL LOW (ref 6.5–8.1)

## 2018-11-09 MED ORDER — SODIUM CHLORIDE 0.9 % IV SOLN
200.0000 mg | Freq: Once | INTRAVENOUS | Status: AC
  Administered 2018-11-09: 13:00:00 200 mg via INTRAVENOUS
  Filled 2018-11-09: qty 8

## 2018-11-09 MED ORDER — SODIUM CHLORIDE 0.9% FLUSH
10.0000 mL | Freq: Once | INTRAVENOUS | Status: AC
  Administered 2018-11-09: 10 mL via INTRAVENOUS
  Filled 2018-11-09: qty 10

## 2018-11-09 MED ORDER — SODIUM CHLORIDE 0.9 % IV SOLN
Freq: Once | INTRAVENOUS | Status: AC
  Administered 2018-11-09: 12:00:00 via INTRAVENOUS
  Filled 2018-11-09: qty 250

## 2018-11-09 MED ORDER — HEPARIN SOD (PORK) LOCK FLUSH 100 UNIT/ML IV SOLN
500.0000 [IU] | Freq: Once | INTRAVENOUS | Status: AC
  Administered 2018-11-09: 500 [IU] via INTRAVENOUS

## 2018-11-09 MED ORDER — TRIAMCINOLONE ACETONIDE 0.5 % EX OINT: TOPICAL_OINTMENT | Freq: Two times a day (BID) | CUTANEOUS | 0 refills | Status: DC

## 2018-11-09 NOTE — Progress Notes (Signed)
Pt has rash that started back Sunday. He wa out in the heat. It is mostly on his back and he needs refill of cream and try to get it bigger amount. He states that his insurance only allows 1 tube a month

## 2018-11-12 ENCOUNTER — Encounter: Payer: Self-pay | Admitting: Oncology

## 2018-11-12 NOTE — Progress Notes (Signed)
Hematology/Oncology Consult note [facility]  Telephone:([phone] [phone]  Patient Care Team: Idelle Crouch, MD as PCP - General (Internal Medicine)   Name of the patient: Anthony Gonzales  [id number]  Apr 01, 1931   Date of visit: 11/12/18  Diagnosis- Metastatic urothelial carcinoma with possible mets to the obturator node. Indeterminate hilar lymph nodes and LUL lung nodule  Chief complaint/ Reason for visit-on treatment assessment prior to cycle 8 of Keytruda  Heme/Onc history: patient is a 85 year old male with past medical history significant for hypertension and long-standing history of superficial bladder cancer for which he sees [name]. He has undergone TURBT as well as ureteroscopy since 2017 along with mitomycin as well in the past. Most recently he underwent CT abdomen on 08/06/2017 which showed a soft tissue mass in the right renal pelvis concerning for upper tract urothelial neoplasm. Soft tissue fullness at the right ureterovesical junction with proximal right hydroureteronephrosis. Several millimeter attenuation lesion in the pancreatic head.  He underwent diagnostic ureteroscopy and was found to have 2 high-grade lesions within his right kidney. There was a nodular high-grade appearing lesion in the right anterior renal pelvis. There was also a second ureteral tumor fungating from the right ureteral orifice extending into the distal ureter also consistent with high-grade invasive urothelial carcinoma. Muscle invasion could not be assessed. He also underwent a CT chest which showed a rounded nodule in the right upper lobe measuring 14 mm concerning for metastases. 2 other 3 mm lesions were also noted in the left upper lobe likely benign  Plan initially was neoadjuvant chemotherapy followed by possible surgery but given the presence of lung lesion patient has been referred to oncology for the same.  PET/CT on 09/21/17  showed:IMPRESSION: 1. Hypermetabolic right obturator lymph node is most indicative of metastatic disease. 2. Mildly hypermetabolic left hilar lymph nodes are nonspecific. Continued attention on follow-up exams is warranted. 3. Right upper lobe pulmonary nodule shows metabolism after just above blood pool and is therefore indeterminate. Continued attention on follow-up exams is warranted. 4. Aortic atherosclerosis (ICD10-170.0). Coronary artery calcification. 5. Cholelithiasis  Carboplatin/ gemzar1 week on and one-week off as patient could not tolerate 2-week on and one week off regimen. Cycle 1 started on 09/22/2017 Disease progression in March 2020. Switched to second [employer]   Interval history-patient did get another short course of steroids when his rash recurred.  His rash has since resolved and there are very few areas seen in his back.  He denies other complaints at this time  ECOG PS- 1 Pain scale- 0   Review of systems- Review of Systems  Constitutional: Positive for malaise/fatigue. Negative for chills, fever and weight loss.  HENT: Negative for congestion, ear discharge and nosebleeds.   Eyes: Negative for blurred vision.  Respiratory: Negative for cough, hemoptysis, sputum production, shortness of breath and wheezing.   Cardiovascular: Negative for chest pain, palpitations, orthopnea and claudication.  Gastrointestinal: Negative for abdominal pain, blood in stool, constipation, diarrhea, heartburn, melena, nausea and vomiting.  Genitourinary: Negative for dysuria, flank pain, frequency, hematuria and urgency.  Musculoskeletal: Negative for back pain, joint pain and myalgias.  Skin: Positive for rash.  Neurological: Negative for dizziness, tingling, focal weakness, seizures, weakness and headaches.  Endo/Heme/Allergies: Does not bruise/bleed easily.  Psychiatric/Behavioral: Negative for depression and suicidal ideas. The patient does not have insomnia.        Allergies  Allergen Reactions  . Demerol [Meperidine] Nausea And Vomiting  . Lipitor [Atorvastatin]  Swelling  . Sulfa Antibiotics Nausea And Vomiting and Rash     Past Medical History:  Diagnosis Date  . Arthritis   . Benign fibroma of prostate 08/23/2013  . BPH (benign prostatic hyperplasia)   . Calculus of kidney 08/23/2013  . Glaucoma    no drops in 3 mo pressure good, pt denies glaucoma, eye pressure has been measuring alright.  . History of kidney stones   . HLD (hyperlipidemia)   . HOH (hard of hearing)    Left Hearing Aid  . HTN (hypertension) 12/26/2014  . Hypertension   . Hyponatremia 12/26/2014  . Migraines    history of migraines when he was younger.  [name] Restless leg syndrome   . Sinus drainage   . Skin cancer   . Urothelial cancer ([place])    chemo tx's.  [name] UTI (lower urinary tract infection) 12/26/2014  . Vertigo      Past Surgical History:  Procedure Laterality Date  . COLONOSCOPY    . CYSTOSCOPY W/ RETROGRADES Right 01/30/2015   Procedure: CYSTOSCOPY WITH RETROGRADE PYELOGRAM;  Surgeon: [name], MD;  Location: [facility];  Service: Urology;  Laterality: Right;  . CYSTOSCOPY W/ RETROGRADES Bilateral 02/26/2016   Procedure: CYSTOSCOPY WITH RETROGRADE PYELOGRAM;  Surgeon: [name], MD;  Location: [facility];  Service: Urology;  Laterality: Bilateral;  . CYSTOSCOPY W/ URETERAL STENT PLACEMENT Right 08/20/2015   Procedure: CYSTOSCOPY WITH RETROGRADE PYELOGRAM/POSSIBLE URETERAL STENT PLACEMENT/BLADDER BIOPSY;  Surgeon: [name], MD;  Location: [facility];  Service: Urology;  Laterality: Right;  . CYSTOSCOPY W/ URETERAL STENT PLACEMENT Right 09/12/2015   Procedure: CYSTOSCOPY WITH STENT REPLACEMENT;  Surgeon: [name], MD;  Location: [facility];  Service: Urology;  Laterality: Right;  . CYSTOSCOPY WITH BIOPSY Right 09/12/2015   Procedure: CYSTOSCOPY WITH BLADDER AND URETERAL BIOPSY;  Surgeon: [name], MD;  Location: [facility];  Service: Urology;   Laterality: Right;  . CYSTOSCOPY WITH STENT PLACEMENT Right 01/30/2015   Procedure: CYSTOSCOPY WITH STENT PLACEMENT;  Surgeon: [name], MD;  Location: [facility];  Service: Urology;  Laterality: Right;  . CYSTOSCOPY WITH STENT PLACEMENT Right 12/28/2017   Procedure: [place] WITH STENT Exchange;  Surgeon: [name], MD;  Location: [facility];  Service: Urology;  Laterality: Right;  . CYSTOSCOPY/URETEROSCOPY/HOLMIUM LASER/STENT PLACEMENT Right 08/19/2017   Procedure: CYSTOSCOPY/URETEROSCOPY/HOLMIUM LASER/STENT PLACEMENT;  Surgeon: [name], MD;  Location: [facility];  Service: Urology;  Laterality: Right;  . EYE SURGERY Bilateral    Cataract Extraction with IOL  . goiter removal    . HOLMIUM LASER APPLICATION N/A 09/17/6267   Procedure:  HOLMIUM LASER APPLICATION;  Surgeon: [name], MD;  Location: [facility];  Service: Urology;  Laterality: N/A;  . PORTA CATH INSERTION N/A 09/23/2017   Procedure: PORTA CATH INSERTION;  Surgeon: Algernon Huxley, MD;  Location: [place] [facility];  Service: Cardiovascular;  Laterality: N/A;  . SPERMATOCELECTOMY    . TONSILLECTOMY    . TRANSURETHRAL RESECTION OF BLADDER TUMOR N/A 02/26/2016   Procedure: TRANSURETHRAL RESECTION OF BLADDER TUMOR (TURBT);  Surgeon: [name], MD;  Location: [facility];  Service: Urology;  Laterality: N/A;  . TRANSURETHRAL RESECTION OF BLADDER TUMOR WITH MITOMYCIN-C N/A 09/12/2015   Procedure: TRANSURETHRAL RESECTION OF BLADDER TUMOR ;  Surgeon: [name], MD;  Location: [facility];  Service: Urology;  Laterality: N/A;  . TRANSURETHRAL RESECTION OF BLADDER TUMOR WITH MITOMYCIN-C N/A 03/24/2016   Procedure: TRANSURETHRAL RESECTION OF BLADDER TUMOR WITH MITOMYCIN-C  (SMALL);  Surgeon: [name], MD;  Location:  [facility];  Service: Urology;  Laterality: N/A;  . URETERAL BIOPSY Right 08/19/2017   Procedure: Renal Mass BIOPSY;  Surgeon: [name], MD;  Location: [facility];  Service: Urology;  Laterality: Right;   . URETEROSCOPY Right 01/30/2015   Procedure: URETEROSCOPY/ WITH BIOPSY AND CYTOLOGY BRUSHING;  Surgeon: [name], MD;  Location: [facility];  Service: Urology;  Laterality: Right;  . URETEROSCOPY Right 08/20/2015   Procedure: URETEROSCOPY;  Surgeon: [name], MD;  Location: [facility];  Service: Urology;  Laterality: Right;  . URETEROSCOPY Right 09/12/2015   Procedure: URETEROSCOPY;  Surgeon: [name], MD;  Location: [facility];  Service: Urology;  Laterality: Right;  . URETEROSCOPY Right 02/26/2016   Procedure: URETEROSCOPY;  Surgeon: [name], MD;  Location: [facility];  Service: Urology;  Laterality: Right;    Social History   Socioeconomic History  . Marital status: Married    Spouse name: Not on file  . Number of children: Not on file  . Years of education: Not on file  . Highest education level: Not on file  Occupational History  . Not on file  Social Needs  . Financial resource strain: Not on file  . Food insecurity    Worry: Not on file    Inability: Not on file  . Transportation needs    Medical: Not on file    Non-medical: Not on file  Tobacco Use  . Smoking status: Never Smoker  . Smokeless tobacco: Never Used  Substance and Sexual Activity  . Alcohol use: No    Alcohol/week: 0.0 standard drinks  . Drug use: No  . Sexual activity: Not on file  Lifestyle  . Physical activity    Days per week: Not on file    Minutes per session: Not on file  . Stress: Not on file  Relationships  . Social Herbalist on phone: Not on file    Gets together: Not on file    Attends religious service: Not on file    Active member of club or organization: Not on file    Attends meetings of clubs or organizations: Not on file    Relationship status: Not on file  . Intimate partner violence    Fear of current or ex partner: Not on file    Emotionally abused: Not on file    Physically abused: Not on file    Forced sexual activity: Not on file  Other Topics  Concern  . Not on file  Social History Narrative  . Not on file    Family History  Problem Relation Age of Onset  . Hypertension Mother   . Hypertension Father   . Prostate cancer Brother   . Kidney disease Neg Hx   . Kidney cancer Neg Hx   . Bladder Cancer Neg Hx      Current Outpatient Medications:  .  acetaminophen (TYLENOL) 500 MG tablet, Take 1,000 mg by mouth daily as needed for moderate pain. , Disp: , Rfl:  .  amLODipine (NORVASC) 5 MG tablet, Take 5 mg by mouth daily. , Disp: , Rfl:  .  benazepril-hydrochlorthiazide (LOTENSIN HCT) 20-12.5 MG tablet, Take 1 tablet by mouth daily. , Disp: , Rfl:  .  diazepam (VALIUM) 5 MG tablet, TAKE 1 TABLET (5 MG TOTAL) BY MOUTH EVERY 12 (TWELVE) HOURS AS NEEDED FOR ANXIETY OR SLEEP, Disp: , Rfl:  .  docusate sodium (COLACE) 100 MG capsule, Take 100 mg by mouth 2 (two) times daily. ,  Disp: , Rfl:  .  fenofibrate micronized (LOFIBRA) 134 MG capsule, Take 134 mg by mouth daily before breakfast. , Disp: , Rfl:  .  fluticasone (FLONASE) 50 MCG/ACT nasal spray, Place 2 sprays into both nostrils at bedtime., Disp: , Rfl:  .  HYDROcodone-acetaminophen (NORCO/VICODIN) 5-325 MG tablet, Take 1 tablet by mouth every 6 (six) hours as needed for severe pain., Disp: 30 tablet, Rfl: 0 .  hydrocortisone cream 1 %, Apply 1 application topically daily as needed for itching., Disp: , Rfl:  .  mirabegron ER (MYRBETRIQ) 50 MG TB24 tablet, Take 1 tablet (50 mg total) by mouth daily., Disp: 30 tablet, Rfl: 11 .  Multiple Vitamin (MULTIVITAMIN WITH MINERALS) TABS tablet, Take 1 tablet by mouth daily., Disp: , Rfl:  .  nystatin (MYCOSTATIN/NYSTOP) powder, APPLY TO AFFECTED AREA 4 TIMES A DAY, Disp: 15 g, Rfl: 0 .  Potassium 99 MG TABS, Take 99 mg by mouth at bedtime., Disp: , Rfl:  .  Probiotic Product (PROBIOTIC-10 PO), Take by mouth every morning., Disp: , Rfl:  .  psyllium (METAMUCIL) 58.6 % packet, Take 1 packet by mouth daily. , Disp: , Rfl:  .  tamsulosin  (FLOMAX) 0.4 MG CAPS capsule, Take 1 capsule (0.4 mg total) by mouth daily., Disp: 90 capsule, Rfl: 3 .  triamcinolone ointment (KENALOG) 0.5 %, Apply topically 2 (two) times daily., Disp: 60 g, Rfl: 0  Physical exam:  Vitals:   11/09/18 1050  BP: 113/62  Pulse: (!) 58  Resp: 16  Temp: 97.7 F (36.5 C)  TempSrc: Tympanic  Weight: 190 lb 11.2 oz (86.5 kg)   Physical Exam Constitutional:      General: He is not in acute distress. HENT:     Head: Normocephalic and atraumatic.  Eyes:     Pupils: Pupils are equal, round, and reactive to light.  Neck:     Musculoskeletal: Normal range of motion.  Cardiovascular:     Rate and Rhythm: Normal rate and regular rhythm.     Heart sounds: Normal heart sounds.  Pulmonary:     Effort: Pulmonary effort is normal.     Breath sounds: Normal breath sounds.  Abdominal:     General: Bowel sounds are normal.     Palpations: Abdomen is soft.  Skin:    General: Skin is warm and dry.     Comments: Few scattered maculopapular lesions seen over the back  Neurological:     Mental Status: He is alert and oriented to person, place, and time.      CMP Latest Ref Rng & Units 11/09/2018  Glucose 70 - 99 mg/dL 92  BUN 8 - 23 mg/dL 26(H)  Creatinine 0.61 - 1.24 mg/dL 1.35(H)  Sodium 135 - 145 mmol/L 134(L)  Potassium 3.5 - 5.1 mmol/L 4.0  Chloride 98 - 111 mmol/L 98  CO2 22 - 32 mmol/L 30  Calcium 8.9 - 10.3 mg/dL 8.6(L)  Total Protein 6.5 - 8.1 g/dL 6.1(L)  Total Bilirubin 0.3 - 1.2 mg/dL 0.8  Alkaline Phos 38 - 126 U/L 46  AST 15 - 41 U/L 27  ALT 0 - 44 U/L 21   CBC Latest Ref Rng & Units 11/09/2018  WBC 4.0 - 10.5 K/uL 6.3  Hemoglobin 13.0 - 17.0 g/dL 12.8(L)  Hematocrit 39.0 - 52.0 % 37.0(L)  Platelets 150 - 400 K/uL 228     Assessment and plan- Patient is a 85 y.o. male with metastatic urothelial carcinoma stage IV cT2 N0 M1 with metastases  to the obturator lymph node as well as hilar adenopathy and right upper lobe lung nodule.  He is  here for on treatment assessment prior to cycle prior to cycle 8 of Keytruda  Patient does seem to have autoimmune dermatitis which is still grade 1 and not extensive.  I will renew his prescription for topical hydrocortisone cream at this time.  If his rash continues to recur I will have to hold off his Beryle Flock and switch him to alternative agents such as Padcev  He will proceed with Keytruda today and I will see him back in 10 days to see how his rash is evolving and if her treatment changes warranted.  I will consider getting scans in about 4 weeks from now   Visit Diagnosis 1. Autoimmune dermatitis   2. 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 Encounter for antineoplastic immunotherapy      Dr. Randa Evens, MD, MPH [facility] at [facility] [phone] 11/12/2018 8:32 AM

## 2018-11-18 NOTE — Progress Notes (Signed)
Called patient no answer left message  

## 2018-11-19 ENCOUNTER — Other Ambulatory Visit: Payer: Self-pay

## 2018-11-19 ENCOUNTER — Encounter: Payer: Self-pay | Admitting: Oncology

## 2018-11-19 ENCOUNTER — Inpatient Hospital Stay (HOSPITAL_BASED_OUTPATIENT_CLINIC_OR_DEPARTMENT_OTHER): Payer: Medicare HMO | Admitting: Oncology

## 2018-11-19 VITALS — BP 115/65 | HR 79 | Temp 97.3°F | Resp 18 | Wt 192.5 lb

## 2018-11-19 DIAGNOSIS — L308 Other specified dermatitis: Secondary | ICD-10-CM

## 2018-11-19 DIAGNOSIS — L270 Generalized skin eruption due to drugs and medicaments taken internally: Secondary | ICD-10-CM | POA: Diagnosis not present

## 2018-11-19 DIAGNOSIS — Z5112 Encounter for antineoplastic immunotherapy: Secondary | ICD-10-CM | POA: Diagnosis not present

## 2018-11-19 NOTE — Progress Notes (Signed)
Pt in for follow up, reports hands itching and wake up at night itching.  Pt states warmth makes hands feel better.  Pt also reports rash on chest is same, back has increased and extending to lower back.

## 2018-11-20 ENCOUNTER — Other Ambulatory Visit: Payer: Self-pay | Admitting: Oncology

## 2018-11-22 NOTE — Progress Notes (Signed)
Hematology/Oncology Consult note [facility]  [phone] [phone]  Patient Care Team: Idelle Crouch, MD as PCP - General (Internal Medicine)   Name of the patient: Anthony Gonzales  [id number]  1931/10/19   Date of visit: 11/22/18  Diagnosis- Metastatic urothelial carcinoma with possible mets to the obturator node. Indeterminate hilar lymph nodes and LUL lung nodule  Chief complaint/ Reason for visit-routine follow-up of rash  Heme/Onc history: patient is a 85 year old male with past medical history significant for hypertension and long-standing history of superficial bladder cancer for which he sees [name]. He has undergone TURBT as well as ureteroscopy since 2017 along with mitomycin as well in the past. Most recently he underwent CT abdomen on 08/06/2017 which showed a soft tissue mass in the right renal pelvis concerning for upper tract urothelial neoplasm. Soft tissue fullness at the right ureterovesical junction with proximal right hydroureteronephrosis. Several millimeter attenuation lesion in the pancreatic head.  He underwent diagnostic ureteroscopy and was found to have 2 high-grade lesions within his right kidney. There was a nodular high-grade appearing lesion in the right anterior renal pelvis. There was also a second ureteral tumor fungating from the right ureteral orifice extending into the distal ureter also consistent with high-grade invasive urothelial carcinoma. Muscle invasion could not be assessed. He also underwent a CT chest which showed a rounded nodule in the right upper lobe measuring 14 mm concerning for metastases. 2 other 3 mm lesions were also noted in the left upper lobe likely benign  Plan initially was neoadjuvant chemotherapy followed by possible surgery but given the presence of lung lesion patient has been referred to oncology for the same.  PET/CT on 09/21/17 showed:IMPRESSION: 1.  Hypermetabolic right obturator lymph node is most indicative of metastatic disease. 2. Mildly hypermetabolic left hilar lymph nodes are nonspecific. Continued attention on follow-up exams is warranted. 3. Right upper lobe pulmonary nodule shows metabolism after just above blood pool and is therefore indeterminate. Continued attention on follow-up exams is warranted. 4. Aortic atherosclerosis (ICD10-170.0). Coronary artery calcification. 5. Cholelithiasis  Carboplatin/ gemzar1 week on and one-week off as patient could not tolerate 2-week on and one week off regimen. Cycle 1 started on 09/22/2017 Disease progression in March 2020. Switched to second line Keytruda   Interval history-rash over his chest wall and abdomen has resolved.  Most of the rash over his upper back has also resolved.  There are few about 8-10 maculopapular lesions over his lower back for which she has been using topical steroid cream.  Denies other complaints at this time  ECOG PS- 1 Pain scale- 0 Opioid associated constipation- no  Review of systems- Review of Systems  Constitutional: Negative for chills, fever, malaise/fatigue and weight loss.  HENT: Negative for congestion, ear discharge and nosebleeds.   Eyes: Negative for blurred vision.  Respiratory: Negative for cough, hemoptysis, sputum production, shortness of breath and wheezing.   Cardiovascular: Negative for chest pain, palpitations, orthopnea and claudication.  Gastrointestinal: Negative for abdominal pain, blood in stool, constipation, diarrhea, heartburn, melena, nausea and vomiting.  Genitourinary: Negative for dysuria, flank pain, frequency, hematuria and urgency.  Musculoskeletal: Negative for back pain, joint pain and myalgias.  Skin: Positive for rash.  Neurological: Negative for dizziness, tingling, focal weakness, seizures, weakness and headaches.  Endo/Heme/Allergies: Does not bruise/bleed easily.  Psychiatric/Behavioral: Negative for  depression and suicidal ideas. The patient does not have insomnia.        Allergies  Allergen Reactions  .  Demerol [Meperidine] Nausea And Vomiting  . Lipitor [Atorvastatin] Swelling  . Sulfa Antibiotics Nausea And Vomiting and Rash     Past Medical History:  Diagnosis Date  . Arthritis   . Benign fibroma of prostate 08/23/2013  . BPH (benign prostatic hyperplasia)   . Calculus of kidney 08/23/2013  . Glaucoma    no drops in 3 mo pressure good, pt denies glaucoma, eye pressure has been measuring alright.  . History of kidney stones   . HLD (hyperlipidemia)   . HOH (hard of hearing)    Left Hearing Aid  . HTN (hypertension) 12/26/2014  . Hypertension   . Hyponatremia 12/26/2014  . Migraines    history of migraines when he was younger.  [name] Restless leg syndrome   . Sinus drainage   . Skin cancer   . Urothelial cancer ([place])    chemo tx's.  [name] UTI (lower urinary tract infection) 12/26/2014  . Vertigo      Past Surgical History:  Procedure Laterality Date  . COLONOSCOPY    . CYSTOSCOPY W/ RETROGRADES Right 01/30/2015   Procedure: CYSTOSCOPY WITH RETROGRADE PYELOGRAM;  Surgeon: [name], MD;  Location: [facility];  Service: Urology;  Laterality: Right;  . CYSTOSCOPY W/ RETROGRADES Bilateral 02/26/2016   Procedure: CYSTOSCOPY WITH RETROGRADE PYELOGRAM;  Surgeon: [name], MD;  Location: [facility];  Service: Urology;  Laterality: Bilateral;  . CYSTOSCOPY W/ URETERAL STENT PLACEMENT Right 08/20/2015   Procedure: CYSTOSCOPY WITH RETROGRADE PYELOGRAM/POSSIBLE URETERAL STENT PLACEMENT/BLADDER BIOPSY;  Surgeon: [name], MD;  Location: [facility];  Service: Urology;  Laterality: Right;  . CYSTOSCOPY W/ URETERAL STENT PLACEMENT Right 09/12/2015   Procedure: CYSTOSCOPY WITH STENT REPLACEMENT;  Surgeon: [name], MD;  Location: [facility];  Service: Urology;  Laterality: Right;  . CYSTOSCOPY WITH BIOPSY Right 09/12/2015   Procedure: CYSTOSCOPY WITH BLADDER AND URETERAL  BIOPSY;  Surgeon: [name], MD;  Location: [facility];  Service: Urology;  Laterality: Right;  . CYSTOSCOPY WITH STENT PLACEMENT Right 01/30/2015   Procedure: CYSTOSCOPY WITH STENT PLACEMENT;  Surgeon: [name], MD;  Location: [facility];  Service: Urology;  Laterality: Right;  . CYSTOSCOPY WITH STENT PLACEMENT Right 12/28/2017   Procedure: [place] WITH STENT Exchange;  Surgeon: [name], MD;  Location: [facility];  Service: Urology;  Laterality: Right;  . CYSTOSCOPY/URETEROSCOPY/HOLMIUM LASER/STENT PLACEMENT Right 08/19/2017   Procedure: CYSTOSCOPY/URETEROSCOPY/HOLMIUM LASER/STENT PLACEMENT;  Surgeon: [name], MD;  Location: [facility];  Service: Urology;  Laterality: Right;  . EYE SURGERY Bilateral    Cataract Extraction with IOL  . goiter removal    . HOLMIUM LASER APPLICATION N/A 2/50/5397   Procedure:  HOLMIUM LASER APPLICATION;  Surgeon: [name], MD;  Location: [facility];  Service: Urology;  Laterality: N/A;  . PORTA CATH INSERTION N/A 09/23/2017   Procedure: PORTA CATH INSERTION;  Surgeon: Algernon Huxley, MD;  Location: [place] [facility];  Service: Cardiovascular;  Laterality: N/A;  . SPERMATOCELECTOMY    . TONSILLECTOMY    . TRANSURETHRAL RESECTION OF BLADDER TUMOR N/A 02/26/2016   Procedure: TRANSURETHRAL RESECTION OF BLADDER TUMOR (TURBT);  Surgeon: [name], MD;  Location: [facility];  Service: Urology;  Laterality: N/A;  . TRANSURETHRAL RESECTION OF BLADDER TUMOR WITH MITOMYCIN-C N/A 09/12/2015   Procedure: TRANSURETHRAL RESECTION OF BLADDER TUMOR ;  Surgeon: [name], MD;  Location: [facility];  Service: Urology;  Laterality: N/A;  . TRANSURETHRAL RESECTION OF BLADDER TUMOR WITH MITOMYCIN-C N/A 03/24/2016   Procedure: TRANSURETHRAL RESECTION OF BLADDER TUMOR WITH MITOMYCIN-C  (  SMALL);  Surgeon: [name], MD;  Location: [facility];  Service: Urology;  Laterality: N/A;  . URETERAL BIOPSY Right 08/19/2017   Procedure: Renal Mass BIOPSY;  Surgeon:  [name], MD;  Location: [facility];  Service: Urology;  Laterality: Right;  . URETEROSCOPY Right 01/30/2015   Procedure: URETEROSCOPY/ WITH BIOPSY AND CYTOLOGY BRUSHING;  Surgeon: [name], MD;  Location: [facility];  Service: Urology;  Laterality: Right;  . URETEROSCOPY Right 08/20/2015   Procedure: URETEROSCOPY;  Surgeon: [name], MD;  Location: [facility];  Service: Urology;  Laterality: Right;  . URETEROSCOPY Right 09/12/2015   Procedure: URETEROSCOPY;  Surgeon: [name], MD;  Location: [facility];  Service: Urology;  Laterality: Right;  . URETEROSCOPY Right 02/26/2016   Procedure: URETEROSCOPY;  Surgeon: [name], MD;  Location: [facility];  Service: Urology;  Laterality: Right;    Social History   Socioeconomic History  . Marital status: Married    Spouse name: Not on file  . Number of children: Not on file  . Years of education: Not on file  . Highest education level: Not on file  Occupational History  . Not on file  Social Needs  . Financial resource strain: Not on file  . Food insecurity    Worry: Not on file    Inability: Not on file  . Transportation needs    Medical: Not on file    Non-medical: Not on file  Tobacco Use  . Smoking status: Never Smoker  . Smokeless tobacco: Never Used  Substance and Sexual Activity  . Alcohol use: No    Alcohol/week: 0.0 standard drinks  . Drug use: No  . Sexual activity: Not on file  Lifestyle  . Physical activity    Days per week: Not on file    Minutes per session: Not on file  . Stress: Not on file  Relationships  . Social Herbalist on phone: Not on file    Gets together: Not on file    Attends religious service: Not on file    Active member of club or organization: Not on file    Attends meetings of clubs or organizations: Not on file    Relationship status: Not on file  . Intimate partner violence    Fear of current or ex partner: Not on file    Emotionally abused: Not on file     Physically abused: Not on file    Forced sexual activity: Not on file  Other Topics Concern  . Not on file  Social History Narrative  . Not on file    Family History  Problem Relation Age of Onset  . Hypertension Mother   . Hypertension Father   . Prostate cancer Brother   . Kidney disease Neg Hx   . Kidney cancer Neg Hx   . Bladder Cancer Neg Hx      Current Outpatient Medications:  .  acetaminophen (TYLENOL) 500 MG tablet, Take 1,000 mg by mouth daily as needed for moderate pain. , Disp: , Rfl:  .  amLODipine (NORVASC) 5 MG tablet, Take 5 mg by mouth daily. , Disp: , Rfl:  .  benazepril-hydrochlorthiazide (LOTENSIN HCT) 20-12.5 MG tablet, Take 1 tablet by mouth daily. , Disp: , Rfl:  .  diazepam (VALIUM) 5 MG tablet, TAKE 1 TABLET (5 MG TOTAL) BY MOUTH EVERY 12 (TWELVE) HOURS AS NEEDED FOR ANXIETY OR SLEEP, Disp: , Rfl:  .  docusate sodium (COLACE) 100 MG capsule, Take 100  mg by mouth 2 (two) times daily. , Disp: , Rfl:  .  fenofibrate micronized (LOFIBRA) 134 MG capsule, Take 134 mg by mouth daily before breakfast. , Disp: , Rfl:  .  fluticasone (FLONASE) 50 MCG/ACT nasal spray, Place 2 sprays into both nostrils at bedtime., Disp: , Rfl:  .  HYDROcodone-acetaminophen (NORCO/VICODIN) 5-325 MG tablet, Take 1 tablet by mouth every 6 (six) hours as needed for severe pain., Disp: 30 tablet, Rfl: 0 .  hydrocortisone cream 1 %, Apply 1 application topically daily as needed for itching., Disp: , Rfl:  .  loratadine (CLARITIN) 10 MG tablet, Take 10 mg by mouth daily., Disp: , Rfl:  .  mirabegron ER (MYRBETRIQ) 50 MG TB24 tablet, Take 1 tablet (50 mg total) by mouth daily., Disp: 30 tablet, Rfl: 11 .  Multiple Vitamin (MULTIVITAMIN WITH MINERALS) TABS tablet, Take 1 tablet by mouth daily., Disp: , Rfl:  .  nystatin (MYCOSTATIN/NYSTOP) powder, APPLY TO AFFECTED AREA 4 TIMES A DAY, Disp: 15 g, Rfl: 0 .  Potassium 99 MG TABS, Take 99 mg by mouth at bedtime., Disp: , Rfl:  .  Probiotic Product  (PROBIOTIC-10 PO), Take by mouth every morning., Disp: , Rfl:  .  psyllium (METAMUCIL) 58.6 % packet, Take 1 packet by mouth daily. , Disp: , Rfl:  .  tamsulosin (FLOMAX) 0.4 MG CAPS capsule, Take 1 capsule (0.4 mg total) by mouth daily., Disp: 90 capsule, Rfl: 3 .  triamcinolone ointment (KENALOG) 0.5 %, APPLY TO AFFECTED AREA TWICE A DAY, Disp: 60 g, Rfl: 0 .  triamcinolone ointment (KENALOG) 0.5 %, APPLY TO AFFECTED AREA TWICE A DAY, Disp: 30 g, Rfl: 0  Physical exam:  Vitals:   11/19/18 1001  BP: 115/65  Pulse: 79  Resp: 18  Temp: (!) 97.3 F (36.3 C)  TempSrc: Tympanic  SpO2: 97%  Weight: 192 lb 8 oz (87.3 kg)   Physical Exam Constitutional:      General: He is not in acute distress. HENT:     Head: Normocephalic and atraumatic.  Eyes:     Pupils: Pupils are equal, round, and reactive to light.  Neck:     Musculoskeletal: Normal range of motion.  Cardiovascular:     Rate and Rhythm: Normal rate and regular rhythm.     Heart sounds: Normal heart sounds.  Pulmonary:     Effort: Pulmonary effort is normal.     Breath sounds: Normal breath sounds.  Abdominal:     General: Bowel sounds are normal.     Palpations: Abdomen is soft.  Skin:    General: Skin is warm and dry.     Comments: Scattered about 8-10 maculopapular lesions seen over the lower back which appear to be healing  Neurological:     Mental Status: He is alert and oriented to person, place, and time.      CMP Latest Ref Rng & Units 11/09/2018  Glucose 70 - 99 mg/dL 92  BUN 8 - 23 mg/dL 26(H)  Creatinine 0.61 - 1.24 mg/dL 1.35(H)  Sodium 135 - 145 mmol/L 134(L)  Potassium 3.5 - 5.1 mmol/L 4.0  Chloride 98 - 111 mmol/L 98  CO2 22 - 32 mmol/L 30  Calcium 8.9 - 10.3 mg/dL 8.6(L)  Total Protein 6.5 - 8.1 g/dL 6.1(L)  Total Bilirubin 0.3 - 1.2 mg/dL 0.8  Alkaline Phos 38 - 126 U/L 46  AST 15 - 41 U/L 27  ALT 0 - 44 U/L 21   CBC Latest Ref Rng &  Units 11/09/2018  WBC 4.0 - 10.5 K/uL 6.3  Hemoglobin 13.0 -  17.0 g/dL 12.8(L)  Hematocrit 39.0 - 52.0 % 37.0(L)  Platelets 150 - 400 K/uL 228     Assessment and plan- Patient is a 85 y.o. 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   He is here for routine follow-up of his drug induced skin rash  Skin rash likely secondary to dermatitis from [facility].  Overall it is mild and grade 1 and has been well controlled with topical steroid.  He did require 2 short courses of oral steroids.  Overall he is otherwise tolerating Keytruda well with minimal side effects.  I will see him back on 11/30/2018 as scheduled for cycle 9 of Keytruda and I will plan to get CT chest abdomen and pelvis with contrast following that.  If there is overt evidence of disease progression I will be switching him to third line Padcev   Visit Diagnosis 1. Drug-induced skin rash   2. Autoimmune dermatitis      Dr. Randa Evens, MD, MPH [facility] at [facility] [phone] 11/22/2018 8:21 AM

## 2018-11-23 ENCOUNTER — Other Ambulatory Visit: Payer: Self-pay | Admitting: *Deleted

## 2018-11-23 MED ORDER — TRIAMCINOLONE ACETONIDE 0.5 % EX OINT: TOPICAL_OINTMENT | CUTANEOUS | 0 refills | Status: DC

## 2018-11-23 NOTE — Telephone Encounter (Signed)
Patient called stating pharmacy will not refill is salve Dr Janese Banks ordered for him and said we need to send a whole new prescription for it. He states the rash has improved, but is still present

## 2018-11-25 DIAGNOSIS — G720 Drug-induced myopathy: Secondary | ICD-10-CM | POA: Insufficient documentation

## 2018-11-25 DIAGNOSIS — T466X5A Adverse effect of antihyperlipidemic and antiarteriosclerotic drugs, initial encounter: Secondary | ICD-10-CM | POA: Insufficient documentation

## 2018-11-29 ENCOUNTER — Other Ambulatory Visit: Payer: Self-pay | Admitting: Oncology

## 2018-11-30 ENCOUNTER — Inpatient Hospital Stay (HOSPITAL_BASED_OUTPATIENT_CLINIC_OR_DEPARTMENT_OTHER): Payer: Medicare HMO | Admitting: Oncology

## 2018-11-30 ENCOUNTER — Inpatient Hospital Stay: Payer: Medicare HMO

## 2018-11-30 ENCOUNTER — Other Ambulatory Visit: Payer: Self-pay

## 2018-11-30 VITALS — BP 130/75 | HR 67 | Temp 96.4°F | Resp 16 | Wt 193.6 lb

## 2018-11-30 DIAGNOSIS — Z5112 Encounter for antineoplastic immunotherapy: Secondary | ICD-10-CM | POA: Diagnosis not present

## 2018-11-30 DIAGNOSIS — C669 Malignant neoplasm of unspecified ureter: Secondary | ICD-10-CM

## 2018-11-30 DIAGNOSIS — L270 Generalized skin eruption due to drugs and medicaments taken internally: Secondary | ICD-10-CM | POA: Diagnosis not present

## 2018-11-30 DIAGNOSIS — Z95828 Presence of other vascular implants and grafts: Secondary | ICD-10-CM

## 2018-11-30 LAB — COMPREHENSIVE METABOLIC PANEL
ALT: 22 U/L (ref 0–44)
AST: 31 U/L (ref 15–41)
Albumin: 3.7 g/dL (ref 3.5–5.0)
Alkaline Phosphatase: 50 U/L (ref 38–126)
Anion gap: 8 (ref 5–15)
BUN: 20 mg/dL (ref 8–23)
CO2: 28 mmol/L (ref 22–32)
Calcium: 8.9 mg/dL (ref 8.9–10.3)
Chloride: 98 mmol/L (ref 98–111)
Creatinine, Ser: 1.15 mg/dL (ref 0.61–1.24)
GFR calc Af Amer: >60 mL/min (ref >60)
GFR calc non Af Amer: 58 mL/min — ABNORMAL LOW (ref >60)
Glucose, Bld: 108 mg/dL — ABNORMAL HIGH (ref 70–99)
Potassium: 4.1 mmol/L (ref 3.5–5.1)
Sodium: 134 mmol/L — ABNORMAL LOW (ref 135–145)
Total Bilirubin: 0.6 mg/dL (ref 0.3–1.2)
Total Protein: 6.7 g/dL (ref 6.5–8.1)

## 2018-11-30 LAB — CBC WITH DIFFERENTIAL/PLATELET
Abs Immature Granulocytes: 0.02 10*3/uL (ref 0.00–0.07)
Basophils Absolute: 0 10*3/uL (ref 0.0–0.1)
Basophils Relative: 1 %
Eosinophils Absolute: 0.1 10*3/uL (ref 0.0–0.5)
Eosinophils Relative: 3 %
HCT: 37.6 % — ABNORMAL LOW (ref 39.0–52.0)
Hemoglobin: 12.9 g/dL — ABNORMAL LOW (ref 13.0–17.0)
Immature Granulocytes: 0 %
Lymphocytes Relative: 28 %
Lymphs Abs: 1.3 10*3/uL (ref 0.7–4.0)
MCH: 29.7 pg (ref 26.0–34.0)
MCHC: 34.3 g/dL (ref 30.0–36.0)
MCV: 86.4 fL (ref 80.0–100.0)
Monocytes Absolute: 0.5 10*3/uL (ref 0.1–1.0)
Monocytes Relative: 11 %
Neutro Abs: 2.6 10*3/uL (ref 1.7–7.7)
Neutrophils Relative %: 57 %
Platelets: 267 10*3/uL (ref 150–400)
RBC: 4.35 MIL/uL (ref 4.22–5.81)
RDW: 14.1 % (ref 11.5–15.5)
WBC: 4.6 10*3/uL (ref 4.0–10.5)
nRBC: 0 % (ref 0.0–0.2)

## 2018-11-30 MED ORDER — HEPARIN SOD (PORK) LOCK FLUSH 100 UNIT/ML IV SOLN
500.0000 [IU] | Freq: Once | INTRAVENOUS | Status: AC | PRN
  Administered 2018-11-30: 13:00:00 500 [IU]

## 2018-11-30 MED ORDER — SODIUM CHLORIDE 0.9 % IV SOLN
Freq: Once | INTRAVENOUS | Status: AC
  Administered 2018-11-30: 12:00:00 via INTRAVENOUS
  Filled 2018-11-30: qty 250

## 2018-11-30 MED ORDER — SODIUM CHLORIDE 0.9% FLUSH
10.0000 mL | Freq: Once | INTRAVENOUS | Status: AC
  Administered 2018-11-30: 10 mL via INTRAVENOUS
  Filled 2018-11-30: qty 10

## 2018-11-30 MED ORDER — SODIUM CHLORIDE 0.9 % IV SOLN
200.0000 mg | Freq: Once | INTRAVENOUS | Status: AC
  Administered 2018-11-30: 12:00:00 200 mg via INTRAVENOUS
  Filled 2018-11-30: qty 8

## 2018-11-30 NOTE — Progress Notes (Signed)
Patient is here for follow up he is doing well says his rash is getting better

## 2018-12-02 ENCOUNTER — Encounter: Payer: Self-pay | Admitting: Oncology

## 2018-12-02 ENCOUNTER — Other Ambulatory Visit: Payer: Self-pay | Admitting: Oncology

## 2018-12-02 NOTE — Progress Notes (Signed)
Hematology/Oncology Consult note [facility]  [phone] [phone]  Patient Care Team: Idelle Crouch, MD as PCP - General (Internal Medicine)   Name of the patient: Anthony Gonzales  [id number]  [birth year]   Date of visit: 12/02/18  Diagnosis- Metastatic urothelial carcinoma with possible mets to the obturator node. Indeterminate hilar lymph nodes and LUL lung nodule  Chief complaint/ Reason for visit-on treatment assessment prior to next cycle of Keytruda  Heme/Onc history: patient is a 85 year old male with past medical history significant for hypertension and long-standing history of superficial bladder cancer for which he sees [name]. He has undergone TURBT as well as ureteroscopy since 2017 along with mitomycin as well in the past. Most recently he underwent CT abdomen on 08/06/2017 which showed a soft tissue mass in the right renal pelvis concerning for upper tract urothelial neoplasm. Soft tissue fullness at the right ureterovesical junction with proximal right hydroureteronephrosis. Several millimeter attenuation lesion in the pancreatic head.  He underwent diagnostic ureteroscopy and was found to have 2 high-grade lesions within his right kidney. There was a nodular high-grade appearing lesion in the right anterior renal pelvis. There was also a second ureteral tumor fungating from the right ureteral orifice extending into the distal ureter also consistent with high-grade invasive urothelial carcinoma. Muscle invasion could not be assessed. He also underwent a CT chest which showed a rounded nodule in the right upper lobe measuring 14 mm concerning for metastases. 2 other 3 mm lesions were also noted in the left upper lobe likely benign  Plan initially was neoadjuvant chemotherapy followed by possible surgery but given the presence of lung lesion patient has been referred to oncology for the same.  PET/CT on  09/21/17 showed:IMPRESSION: 1. Hypermetabolic right obturator lymph node is most indicative of metastatic disease. 2. Mildly hypermetabolic left hilar lymph nodes are nonspecific. Continued attention on follow-up exams is warranted. 3. Right upper lobe pulmonary nodule shows metabolism after just above blood pool and is therefore indeterminate. Continued attention on follow-up exams is warranted. 4. Aortic atherosclerosis (ICD10-170.0). Coronary artery calcification. 5. Cholelithiasis  Carboplatin/ gemzar1 week on and one-week off as patient could not tolerate 2-week on and one week off regimen. Cycle 1 started on 09/22/2017 Disease progression in March 2020. Switched to second [employer]   Interval history-he still has few scattered areas of rash which have not evolved since the last visit.  He has been using topical triamcinolone which has been keeping it under control.  Denies any other complaints at this time  ECOG PS- 1 Pain scale- 0   Review of systems- Review of Systems  Constitutional: Negative for chills, fever, malaise/fatigue and weight loss.  HENT: Negative for congestion, ear discharge and nosebleeds.   Eyes: Negative for blurred vision.  Respiratory: Negative for cough, hemoptysis, sputum production, shortness of breath and wheezing.   Cardiovascular: Negative for chest pain, palpitations, orthopnea and claudication.  Gastrointestinal: Negative for abdominal pain, blood in stool, constipation, diarrhea, heartburn, melena, nausea and vomiting.  Genitourinary: Negative for dysuria, flank pain, frequency, hematuria and urgency.  Musculoskeletal: Negative for back pain, joint pain and myalgias.  Skin: Positive for rash.  Neurological: Negative for dizziness, tingling, focal weakness, seizures, weakness and headaches.  Endo/Heme/Allergies: Does not bruise/bleed easily.  Psychiatric/Behavioral: Negative for depression and suicidal ideas. The patient does not have  insomnia.       Allergies  Allergen Reactions  . Demerol [Meperidine] Nausea And Vomiting  . Lipitor [Atorvastatin]  Swelling  . Sulfa Antibiotics Nausea And Vomiting and Rash     Past Medical History:  Diagnosis Date  . Arthritis   . Benign fibroma of prostate 08/23/2013  . BPH (benign prostatic hyperplasia)   . Calculus of kidney 08/23/2013  . Glaucoma    no drops in 3 mo pressure good, pt denies glaucoma, eye pressure has been measuring alright.  . History of kidney stones   . HLD (hyperlipidemia)   . HOH (hard of hearing)    Left Hearing Aid  . HTN (hypertension) 12/26/2014  . Hypertension   . Hyponatremia 12/26/2014  . Migraines    history of migraines when he was younger.  [name] Restless leg syndrome   . Sinus drainage   . Skin cancer   . Urothelial cancer ([place])    chemo tx's.  [name] UTI (lower urinary tract infection) 12/26/2014  . Vertigo      Past Surgical History:  Procedure Laterality Date  . COLONOSCOPY    . CYSTOSCOPY W/ RETROGRADES Right 01/30/2015   Procedure: CYSTOSCOPY WITH RETROGRADE PYELOGRAM;  Surgeon: [name], MD;  Location: [facility];  Service: Urology;  Laterality: Right;  . CYSTOSCOPY W/ RETROGRADES Bilateral 02/26/2016   Procedure: CYSTOSCOPY WITH RETROGRADE PYELOGRAM;  Surgeon: [name], MD;  Location: [facility];  Service: Urology;  Laterality: Bilateral;  . CYSTOSCOPY W/ URETERAL STENT PLACEMENT Right 08/20/2015   Procedure: CYSTOSCOPY WITH RETROGRADE PYELOGRAM/POSSIBLE URETERAL STENT PLACEMENT/BLADDER BIOPSY;  Surgeon: [name], MD;  Location: [facility];  Service: Urology;  Laterality: Right;  . CYSTOSCOPY W/ URETERAL STENT PLACEMENT Right 09/12/2015   Procedure: CYSTOSCOPY WITH STENT REPLACEMENT;  Surgeon: [name], MD;  Location: [facility];  Service: Urology;  Laterality: Right;  . CYSTOSCOPY WITH BIOPSY Right 09/12/2015   Procedure: CYSTOSCOPY WITH BLADDER AND URETERAL BIOPSY;  Surgeon: [name], MD;  Location: [facility];   Service: Urology;  Laterality: Right;  . CYSTOSCOPY WITH STENT PLACEMENT Right 01/30/2015   Procedure: CYSTOSCOPY WITH STENT PLACEMENT;  Surgeon: [name], MD;  Location: [facility];  Service: Urology;  Laterality: Right;  . CYSTOSCOPY WITH STENT PLACEMENT Right 12/28/2017   Procedure: Raysal WITH STENT Exchange;  Surgeon: [name], MD;  Location: [facility];  Service: Urology;  Laterality: Right;  . CYSTOSCOPY/URETEROSCOPY/HOLMIUM LASER/STENT PLACEMENT Right 08/19/2017   Procedure: CYSTOSCOPY/URETEROSCOPY/HOLMIUM LASER/STENT PLACEMENT;  Surgeon: [name], MD;  Location: [facility];  Service: Urology;  Laterality: Right;  . EYE SURGERY Bilateral    Cataract Extraction with IOL  . goiter removal    . HOLMIUM LASER APPLICATION N/A 04/03/5807   Procedure:  HOLMIUM LASER APPLICATION;  Surgeon: [name], MD;  Location: [facility];  Service: Urology;  Laterality: N/A;  . PORTA CATH INSERTION N/A 09/23/2017   Procedure: PORTA CATH INSERTION;  Surgeon: Algernon Huxley, MD;  Location: [facility];  Service: Cardiovascular;  Laterality: N/A;  . SPERMATOCELECTOMY    . TONSILLECTOMY    . TRANSURETHRAL RESECTION OF BLADDER TUMOR N/A 02/26/2016   Procedure: TRANSURETHRAL RESECTION OF BLADDER TUMOR (TURBT);  Surgeon: [name], MD;  Location: [facility];  Service: Urology;  Laterality: N/A;  . TRANSURETHRAL RESECTION OF BLADDER TUMOR WITH MITOMYCIN-C N/A 09/12/2015   Procedure: TRANSURETHRAL RESECTION OF BLADDER TUMOR ;  Surgeon: [name], MD;  Location: [facility];  Service: Urology;  Laterality: N/A;  . TRANSURETHRAL RESECTION OF BLADDER TUMOR WITH MITOMYCIN-C N/A 03/24/2016   Procedure: TRANSURETHRAL RESECTION OF BLADDER TUMOR WITH MITOMYCIN-C  (SMALL);  Surgeon: [name], MD;  Location:  [facility];  Service: Urology;  Laterality: N/A;  . URETERAL BIOPSY Right 08/19/2017   Procedure: Renal Mass BIOPSY;  Surgeon: [name], MD;  Location: [facility];  Service: Urology;   Laterality: Right;  . URETEROSCOPY Right 01/30/2015   Procedure: URETEROSCOPY/ WITH BIOPSY AND CYTOLOGY BRUSHING;  Surgeon: [name], MD;  Location: [facility];  Service: Urology;  Laterality: Right;  . URETEROSCOPY Right 08/20/2015   Procedure: URETEROSCOPY;  Surgeon: [name], MD;  Location: [facility];  Service: Urology;  Laterality: Right;  . URETEROSCOPY Right 09/12/2015   Procedure: URETEROSCOPY;  Surgeon: [name], MD;  Location: [facility];  Service: Urology;  Laterality: Right;  . URETEROSCOPY Right 02/26/2016   Procedure: URETEROSCOPY;  Surgeon: [name], MD;  Location: [facility];  Service: Urology;  Laterality: Right;    Social History   Socioeconomic History  . Marital status: Married    Spouse name: Not on file  . Number of children: Not on file  . Years of education: Not on file  . Highest education level: Not on file  Occupational History  . Not on file  Social Needs  . Financial resource strain: Not on file  . Food insecurity    Worry: Not on file    Inability: Not on file  . Transportation needs    Medical: Not on file    Non-medical: Not on file  Tobacco Use  . Smoking status: Never Smoker  . Smokeless tobacco: Never Used  Substance and Sexual Activity  . Alcohol use: No    Alcohol/week: 0.0 standard drinks  . Drug use: No  . Sexual activity: Not on file  Lifestyle  . Physical activity    Days per week: Not on file    Minutes per session: Not on file  . Stress: Not on file  Relationships  . Social Herbalist on phone: Not on file    Gets together: Not on file    Attends religious service: Not on file    Active member of club or organization: Not on file    Attends meetings of clubs or organizations: Not on file    Relationship status: Not on file  . Intimate partner violence    Fear of current or ex partner: Not on file    Emotionally abused: Not on file    Physically abused: Not on file    Forced sexual activity: Not on  file  Other Topics Concern  . Not on file  Social History Narrative  . Not on file    Family History  Problem Relation Age of Onset  . Hypertension Mother   . Hypertension Father   . Prostate cancer Brother   . Kidney disease Neg Hx   . Kidney cancer Neg Hx   . Bladder Cancer Neg Hx      Current Outpatient Medications:  .  acetaminophen (TYLENOL) 500 MG tablet, Take 1,000 mg by mouth daily as needed for moderate pain. , Disp: , Rfl:  .  amLODipine (NORVASC) 5 MG tablet, Take 5 mg by mouth daily. , Disp: , Rfl:  .  benazepril-hydrochlorthiazide (LOTENSIN HCT) 20-12.5 MG tablet, Take 1 tablet by mouth daily. , Disp: , Rfl:  .  cetirizine (ZYRTEC) 10 MG tablet, Take 10 mg by mouth daily., Disp: , Rfl:  .  diazepam (VALIUM) 5 MG tablet, TAKE 1 TABLET (5 MG TOTAL) BY MOUTH EVERY 12 (TWELVE) HOURS AS NEEDED FOR ANXIETY OR SLEEP, Disp: , Rfl:  .  docusate sodium (COLACE) 100 MG capsule, Take 100 mg by mouth 2 (two) times daily. , Disp: , Rfl:  .  fenofibrate micronized (LOFIBRA) 134 MG capsule, Take 134 mg by mouth daily before breakfast. , Disp: , Rfl:  .  fluticasone (FLONASE) 50 MCG/ACT nasal spray, Place 2 sprays into both nostrils at bedtime., Disp: , Rfl:  .  HYDROcodone-acetaminophen (NORCO/VICODIN) 5-325 MG tablet, Take 1 tablet by mouth every 6 (six) hours as needed for severe pain., Disp: 30 tablet, Rfl: 0 .  hydrocortisone cream 1 %, Apply 1 application topically daily as needed for itching., Disp: , Rfl:  .  mirabegron ER (MYRBETRIQ) 50 MG TB24 tablet, Take 1 tablet (50 mg total) by mouth daily., Disp: 30 tablet, Rfl: 11 .  Multiple Vitamin (MULTIVITAMIN WITH MINERALS) TABS tablet, Take 1 tablet by mouth daily., Disp: , Rfl:  .  nystatin (MYCOSTATIN/NYSTOP) powder, APPLY TO AFFECTED AREA 4 TIMES A DAY, Disp: 15 g, Rfl: 0 .  Potassium 99 MG TABS, Take 99 mg by mouth at bedtime., Disp: , Rfl:  .  Probiotic Product (PROBIOTIC-10 PO), Take by mouth every morning., Disp: , Rfl:  .   psyllium (METAMUCIL) 58.6 % packet, Take 1 packet by mouth daily. , Disp: , Rfl:  .  tamsulosin (FLOMAX) 0.4 MG CAPS capsule, Take 1 capsule (0.4 mg total) by mouth daily., Disp: 90 capsule, Rfl: 3 .  triamcinolone ointment (KENALOG) 0.5 %, APPLY TO AFFECTED AREA TWICE A DAY, Disp: 60 g, Rfl: 0 .  loratadine (CLARITIN) 10 MG tablet, Take 10 mg by mouth daily., Disp: , Rfl:   Physical exam:  Vitals:   11/30/18 1054  BP: 130/75  Pulse: 67  Resp: 16  Temp: (!) 96.4 F (35.8 C)  TempSrc: Tympanic  SpO2: 97%  Weight: 193 lb 9.6 oz (87.8 kg)   Physical Exam Constitutional:      General: He is not in acute distress. HENT:     Head: Normocephalic and atraumatic.  Eyes:     Pupils: Pupils are equal, round, and reactive to light.  Neck:     Musculoskeletal: Normal range of motion.  Cardiovascular:     Rate and Rhythm: Normal rate and regular rhythm.     Heart sounds: Normal heart sounds.  Pulmonary:     Effort: Pulmonary effort is normal.     Breath sounds: Normal breath sounds.  Abdominal:     General: Bowel sounds are normal.     Palpations: Abdomen is soft.  Skin:    General: Skin is warm and dry.     Comments: Scattered areas of maculopapular rash seen over lower back and the trunk.  Lesions are from 4-8 on each side  Neurological:     Mental Status: He is alert and oriented to person, place, and time.      CMP Latest Ref Rng & Units 11/30/2018  Glucose 70 - 99 mg/dL 108(H)  BUN 8 - 23 mg/dL 20  Creatinine 0.61 - 1.24 mg/dL 1.15  Sodium 135 - 145 mmol/L 134(L)  Potassium 3.5 - 5.1 mmol/L 4.1  Chloride 98 - 111 mmol/L 98  CO2 22 - 32 mmol/L 28  Calcium 8.9 - 10.3 mg/dL 8.9  Total Protein 6.5 - 8.1 g/dL 6.7  Total Bilirubin 0.3 - 1.2 mg/dL 0.6  Alkaline Phos 38 - 126 U/L 50  AST 15 - 41 U/L 31  ALT 0 - 44 U/L 22   CBC Latest Ref Rng & Units 11/30/2018  WBC 4.0 -  10.5 K/uL 4.6  Hemoglobin 13.0 - 17.0 g/dL 12.9(L)  Hematocrit 39.0 - 52.0 % 37.6(L)  Platelets 150 -  400 K/uL 267      Assessment and plan- Patient is a 85 y.o. 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    He is here for on treatment assessment prior to next cycle of Keytruda  Counts okay to proceed with next cycle of Keytruda today.  I will see him back in 3 weeks time with CBC with differential and CMP for cycle 10.  Obtain CT chest abdomen and pelvis with contrast prior.  Drug-induced skin rash: Likely autoimmune from [facility].  It is mild and grade 1 and controlled with topical triamcinolone.  Continue to monitor   Visit Diagnosis 1. 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 Encounter for antineoplastic immunotherapy   3. Drug-induced skin rash      Dr. Randa Evens, MD, MPH [facility] [phone] 12/02/2018 10:18 AM

## 2018-12-07 ENCOUNTER — Other Ambulatory Visit: Payer: Self-pay | Admitting: Oncology

## 2018-12-14 ENCOUNTER — Other Ambulatory Visit: Payer: Self-pay

## 2018-12-14 ENCOUNTER — Ambulatory Visit
Admission: RE | Admit: 2018-12-14 | Discharge: 2018-12-14 | Disposition: A | Discharge status: Home / Self Care | Payer: Medicare HMO | Source: Ambulatory Visit | Attending: Oncology | Admitting: Oncology

## 2018-12-14 DIAGNOSIS — I700 Atherosclerosis of aorta: Secondary | ICD-10-CM | POA: Insufficient documentation

## 2018-12-14 DIAGNOSIS — C669 Malignant neoplasm of unspecified ureter: Secondary | ICD-10-CM | POA: Diagnosis not present

## 2018-12-14 DIAGNOSIS — R918 Other nonspecific abnormal finding of lung field: Secondary | ICD-10-CM | POA: Insufficient documentation

## 2018-12-14 DIAGNOSIS — K862 Cyst of pancreas: Secondary | ICD-10-CM | POA: Insufficient documentation

## 2018-12-14 MED ORDER — IOHEXOL 300 MG/ML  SOLN
100.0000 mL | Freq: Once | INTRAMUSCULAR | Status: AC | PRN
  Administered 2018-12-14: 12:00:00 100 mL via INTRAVENOUS

## 2018-12-21 ENCOUNTER — Inpatient Hospital Stay (HOSPITAL_BASED_OUTPATIENT_CLINIC_OR_DEPARTMENT_OTHER): Payer: Medicare HMO | Admitting: Oncology

## 2018-12-21 ENCOUNTER — Encounter: Payer: Self-pay | Admitting: Oncology

## 2018-12-21 ENCOUNTER — Other Ambulatory Visit: Payer: Self-pay

## 2018-12-21 ENCOUNTER — Inpatient Hospital Stay: Payer: Medicare HMO

## 2018-12-21 ENCOUNTER — Inpatient Hospital Stay: Payer: Medicare HMO | Attending: Oncology

## 2018-12-21 VITALS — BP 124/74 | Temp 97.1°F | Ht 72.0 in | Wt 197.4 lb

## 2018-12-21 VITALS — HR 73

## 2018-12-21 DIAGNOSIS — C661 Malignant neoplasm of right ureter: Secondary | ICD-10-CM | POA: Diagnosis present

## 2018-12-21 DIAGNOSIS — Z79899 Other long term (current) drug therapy: Secondary | ICD-10-CM | POA: Insufficient documentation

## 2018-12-21 DIAGNOSIS — K5730 Diverticulosis of large intestine without perforation or abscess without bleeding: Secondary | ICD-10-CM | POA: Insufficient documentation

## 2018-12-21 DIAGNOSIS — M1990 Unspecified osteoarthritis, unspecified site: Secondary | ICD-10-CM | POA: Insufficient documentation

## 2018-12-21 DIAGNOSIS — N400 Enlarged prostate without lower urinary tract symptoms: Secondary | ICD-10-CM | POA: Insufficient documentation

## 2018-12-21 DIAGNOSIS — C7801 Secondary malignant neoplasm of right lung: Secondary | ICD-10-CM | POA: Diagnosis present

## 2018-12-21 DIAGNOSIS — Z885 Allergy status to narcotic agent status: Secondary | ICD-10-CM | POA: Diagnosis not present

## 2018-12-21 DIAGNOSIS — K862 Cyst of pancreas: Secondary | ICD-10-CM | POA: Diagnosis not present

## 2018-12-21 DIAGNOSIS — Z8249 Family history of ischemic heart disease and other diseases of the circulatory system: Secondary | ICD-10-CM | POA: Diagnosis not present

## 2018-12-21 DIAGNOSIS — C688 Malignant neoplasm of overlapping sites of urinary organs: Secondary | ICD-10-CM | POA: Diagnosis present

## 2018-12-21 DIAGNOSIS — Z5112 Encounter for antineoplastic immunotherapy: Secondary | ICD-10-CM | POA: Insufficient documentation

## 2018-12-21 DIAGNOSIS — Z87442 Personal history of urinary calculi: Secondary | ICD-10-CM | POA: Insufficient documentation

## 2018-12-21 DIAGNOSIS — C669 Malignant neoplasm of unspecified ureter: Secondary | ICD-10-CM

## 2018-12-21 DIAGNOSIS — Z85828 Personal history of other malignant neoplasm of skin: Secondary | ICD-10-CM | POA: Insufficient documentation

## 2018-12-21 DIAGNOSIS — Z882 Allergy status to sulfonamides status: Secondary | ICD-10-CM | POA: Insufficient documentation

## 2018-12-21 DIAGNOSIS — Z95828 Presence of other vascular implants and grafts: Secondary | ICD-10-CM

## 2018-12-21 DIAGNOSIS — L270 Generalized skin eruption due to drugs and medicaments taken internally: Secondary | ICD-10-CM | POA: Insufficient documentation

## 2018-12-21 DIAGNOSIS — N179 Acute kidney failure, unspecified: Secondary | ICD-10-CM | POA: Diagnosis not present

## 2018-12-21 DIAGNOSIS — N281 Cyst of kidney, acquired: Secondary | ICD-10-CM | POA: Diagnosis not present

## 2018-12-21 DIAGNOSIS — N270 Small kidney, unilateral: Secondary | ICD-10-CM | POA: Insufficient documentation

## 2018-12-21 DIAGNOSIS — Z8042 Family history of malignant neoplasm of prostate: Secondary | ICD-10-CM | POA: Insufficient documentation

## 2018-12-21 DIAGNOSIS — K8020 Calculus of gallbladder without cholecystitis without obstruction: Secondary | ICD-10-CM | POA: Diagnosis not present

## 2018-12-21 DIAGNOSIS — C775 Secondary and unspecified malignant neoplasm of intrapelvic lymph nodes: Secondary | ICD-10-CM | POA: Diagnosis not present

## 2018-12-21 DIAGNOSIS — I700 Atherosclerosis of aorta: Secondary | ICD-10-CM | POA: Insufficient documentation

## 2018-12-21 LAB — COMPREHENSIVE METABOLIC PANEL
ALT: 20 U/L (ref 0–44)
AST: 29 U/L (ref 15–41)
Albumin: 3.5 g/dL (ref 3.5–5.0)
Alkaline Phosphatase: 49 U/L (ref 38–126)
Anion gap: 7 (ref 5–15)
BUN: 22 mg/dL (ref 8–23)
CO2: 27 mmol/L (ref 22–32)
Calcium: 8.7 mg/dL — ABNORMAL LOW (ref 8.9–10.3)
Chloride: 101 mmol/L (ref 98–111)
Creatinine, Ser: 1.25 mg/dL — ABNORMAL HIGH (ref 0.61–1.24)
GFR calc Af Amer: >60 mL/min (ref >60)
GFR calc non Af Amer: 52 mL/min — ABNORMAL LOW (ref >60)
Glucose, Bld: 142 mg/dL — ABNORMAL HIGH (ref 70–99)
Potassium: 3.7 mmol/L (ref 3.5–5.1)
Sodium: 135 mmol/L (ref 135–145)
Total Bilirubin: 0.5 mg/dL (ref 0.3–1.2)
Total Protein: 6.3 g/dL — ABNORMAL LOW (ref 6.5–8.1)

## 2018-12-21 LAB — CBC WITH DIFFERENTIAL/PLATELET
Abs Immature Granulocytes: 0.03 10*3/uL (ref 0.00–0.07)
Basophils Absolute: 0.1 10*3/uL (ref 0.0–0.1)
Basophils Relative: 1 %
Eosinophils Absolute: 0.2 10*3/uL (ref 0.0–0.5)
Eosinophils Relative: 4 %
HCT: 37.5 % — ABNORMAL LOW (ref 39.0–52.0)
Hemoglobin: 12.9 g/dL — ABNORMAL LOW (ref 13.0–17.0)
Immature Granulocytes: 1 %
Lymphocytes Relative: 29 %
Lymphs Abs: 1.4 10*3/uL (ref 0.7–4.0)
MCH: 29.9 pg (ref 26.0–34.0)
MCHC: 34.4 g/dL (ref 30.0–36.0)
MCV: 86.8 fL (ref 80.0–100.0)
Monocytes Absolute: 0.4 10*3/uL (ref 0.1–1.0)
Monocytes Relative: 9 %
Neutro Abs: 2.8 10*3/uL (ref 1.7–7.7)
Neutrophils Relative %: 56 %
Platelets: 204 10*3/uL (ref 150–400)
RBC: 4.32 MIL/uL (ref 4.22–5.81)
RDW: 13.6 % (ref 11.5–15.5)
WBC: 5 10*3/uL (ref 4.0–10.5)
nRBC: 0 % (ref 0.0–0.2)

## 2018-12-21 MED ORDER — HEPARIN SOD (PORK) LOCK FLUSH 100 UNIT/ML IV SOLN
500.0000 [IU] | Freq: Once | INTRAVENOUS | Status: AC
  Administered 2018-12-21: 11:00:00 500 [IU] via INTRAVENOUS

## 2018-12-21 MED ORDER — SODIUM CHLORIDE 0.9 % IV SOLN
200.0000 mg | Freq: Once | INTRAVENOUS | Status: AC
  Administered 2018-12-21: 10:00:00 200 mg via INTRAVENOUS
  Filled 2018-12-21: qty 8

## 2018-12-21 MED ORDER — HEPARIN SOD (PORK) LOCK FLUSH 100 UNIT/ML IV SOLN
INTRAVENOUS | Status: AC
  Filled 2018-12-21: qty 5

## 2018-12-21 MED ORDER — SODIUM CHLORIDE 0.9 % IV SOLN
Freq: Once | INTRAVENOUS | Status: AC
  Administered 2018-12-21: 10:00:00 via INTRAVENOUS
  Filled 2018-12-21: qty 250

## 2018-12-21 MED ORDER — SODIUM CHLORIDE 0.9% FLUSH
10.0000 mL | Freq: Once | INTRAVENOUS | Status: AC
  Administered 2018-12-21: 10 mL via INTRAVENOUS
  Filled 2018-12-21: qty 10

## 2018-12-21 NOTE — Progress Notes (Signed)
Hematology/Oncology Consult note [facility]  Telephone:([phone] [phone]  Patient Care Team: Idelle Crouch, MD as PCP - General (Internal Medicine)   Name of the patient: Anthony Gonzales  [id number]  [birth year]   Date of visit: 12/21/18  Diagnosis- Metastatic urothelial carcinoma with possible mets to the obturator node. Indeterminate hilar lymph nodes and LUL lung nodule  Chief complaint/ Reason for visit- On treatment assessment prior to next cycle of Keytruda  Heme/Onc history: patient is a 85 year old male with past medical history significant for hypertension and long-standing history of superficial bladder cancer for which he sees [name]. He has undergone TURBT as well as ureteroscopy since 2017 along with mitomycin as well in the past. Most recently he underwent CT abdomen on 08/06/2017 which showed a soft tissue mass in the right renal pelvis concerning for upper tract urothelial neoplasm. Soft tissue fullness at the right ureterovesical junction with proximal right hydroureteronephrosis. Several millimeter attenuation lesion in the pancreatic head.  He underwent diagnostic ureteroscopy and was found to have 2 high-grade lesions within his right kidney. There was a nodular high-grade appearing lesion in the right anterior renal pelvis. There was also a second ureteral tumor fungating from the right ureteral orifice extending into the distal ureter also consistent with high-grade invasive urothelial carcinoma. Muscle invasion could not be assessed. He also underwent a CT chest which showed a rounded nodule in the right upper lobe measuring 14 mm concerning for metastases. 2 other 3 mm lesions were also noted in the left upper lobe likely benign  Plan initially was neoadjuvant chemotherapy followed by possible surgery but given the presence of lung lesion patient has been referred to oncology for the same.  PET/CT on  09/21/17 showed:IMPRESSION: 1. Hypermetabolic right obturator lymph node is most indicative of metastatic disease. 2. Mildly hypermetabolic left hilar lymph nodes are nonspecific. Continued attention on follow-up exams is warranted. 3. Right upper lobe pulmonary nodule shows metabolism after just above blood pool and is therefore indeterminate. Continued attention on follow-up exams is warranted. 4. Aortic atherosclerosis (ICD10-170.0). Coronary artery calcification. 5. Cholelithiasis  Carboplatin/ gemzar1 week on and one-week off as patient could not tolerate 2-week on and one week off regimen. Cycle 1 started on 09/22/2017 Disease progression in March 2020. Switched to second line Keytruda    Interval history-he has few spots of rash over his trunk for which he uses occasional topical steroid cream but has not needed to use any oral steroids.  He developed a spot in his left groin which grew initially but eventually resolved.  Currently patient denies any other complaints at this time  ECOG PS- 1 Pain scale- 0   Review of systems- Review of Systems  Constitutional: Negative for chills, fever, malaise/fatigue and weight loss.  HENT: Negative for congestion, ear discharge and nosebleeds.   Eyes: Negative for blurred vision.  Respiratory: Negative for cough, hemoptysis, sputum production, shortness of breath and wheezing.   Cardiovascular: Negative for chest pain, palpitations, orthopnea and claudication.  Gastrointestinal: Negative for abdominal pain, blood in stool, constipation, diarrhea, heartburn, melena, nausea and vomiting.  Genitourinary: Negative for dysuria, flank pain, frequency, hematuria and urgency.  Musculoskeletal: Negative for back pain, joint pain and myalgias.  Skin: Negative for rash.  Neurological: Negative for dizziness, tingling, focal weakness, seizures, weakness and headaches.  Endo/Heme/Allergies: Does not bruise/bleed easily.    Psychiatric/Behavioral: Negative for depression and suicidal ideas. The patient does not have insomnia.  Allergies  Allergen Reactions   Demerol [Meperidine] Nausea And Vomiting   Lipitor [Atorvastatin] Swelling   Sulfa Antibiotics Nausea And Vomiting and Rash     Past Medical History:  Diagnosis Date   Arthritis    Benign fibroma of prostate 08/23/2013   BPH (benign prostatic hyperplasia)    Calculus of kidney 08/23/2013   Glaucoma    no drops in 3 mo pressure good, pt denies glaucoma, eye pressure has been measuring alright.   History of kidney stones    HLD (hyperlipidemia)    HOH (hard of hearing)    Left Hearing Aid   HTN (hypertension) 12/26/2014   Hypertension    Hyponatremia 12/26/2014   Migraines    history of migraines when he was younger.   Restless leg syndrome    Sinus drainage    Skin cancer    Urothelial cancer ([place])    chemo tx's.   UTI (lower urinary tract infection) 12/26/2014   Vertigo      Past Surgical History:  Procedure Laterality Date   COLONOSCOPY     CYSTOSCOPY W/ RETROGRADES Right 01/30/2015   Procedure: CYSTOSCOPY WITH RETROGRADE PYELOGRAM;  Surgeon: [name], MD;  Location: [facility];  Service: Urology;  Laterality: Right;   CYSTOSCOPY W/ RETROGRADES Bilateral 02/26/2016   Procedure: CYSTOSCOPY WITH RETROGRADE PYELOGRAM;  Surgeon: [name], MD;  Location: [facility];  Service: Urology;  Laterality: Bilateral;   CYSTOSCOPY W/ URETERAL STENT PLACEMENT Right 08/20/2015   Procedure: CYSTOSCOPY WITH RETROGRADE PYELOGRAM/POSSIBLE URETERAL STENT PLACEMENT/BLADDER BIOPSY;  Surgeon: [name], MD;  Location: [facility];  Service: Urology;  Laterality: Right;   CYSTOSCOPY W/ URETERAL STENT PLACEMENT Right 09/12/2015   Procedure: CYSTOSCOPY WITH STENT REPLACEMENT;  Surgeon: [name], MD;  Location: [facility];  Service: Urology;  Laterality: Right;   CYSTOSCOPY WITH BIOPSY Right 09/12/2015   Procedure:  CYSTOSCOPY WITH BLADDER AND URETERAL BIOPSY;  Surgeon: [name], MD;  Location: [facility];  Service: Urology;  Laterality: Right;   CYSTOSCOPY WITH STENT PLACEMENT Right 01/30/2015   Procedure: CYSTOSCOPY WITH STENT PLACEMENT;  Surgeon: [name], MD;  Location: [facility];  Service: Urology;  Laterality: Right;   CYSTOSCOPY WITH STENT PLACEMENT Right 12/28/2017   Procedure: CYSTOSCOPY WITH STENT Exchange;  Surgeon: [name], MD;  Location: [facility];  Service: Urology;  Laterality: Right;   CYSTOSCOPY/URETEROSCOPY/HOLMIUM LASER/STENT PLACEMENT Right 08/19/2017   Procedure: CYSTOSCOPY/URETEROSCOPY/HOLMIUM LASER/STENT PLACEMENT;  Surgeon: [name], MD;  Location: [facility];  Service: Urology;  Laterality: Right;   EYE SURGERY Bilateral    Cataract Extraction with IOL   goiter removal     HOLMIUM LASER APPLICATION N/A 08/28/3084   Procedure:  HOLMIUM LASER APPLICATION;  Surgeon: [name], MD;  Location: [facility];  Service: Urology;  Laterality: N/A;   PORTA CATH INSERTION N/A 09/23/2017   Procedure: PORTA CATH INSERTION;  Surgeon: Algernon Huxley, MD;  Location: [place] [facility];  Service: Cardiovascular;  Laterality: N/A;   SPERMATOCELECTOMY     TONSILLECTOMY     TRANSURETHRAL RESECTION OF BLADDER TUMOR N/A 02/26/2016   Procedure: TRANSURETHRAL RESECTION OF BLADDER TUMOR (TURBT);  Surgeon: [name], MD;  Location: [facility];  Service: Urology;  Laterality: N/A;   TRANSURETHRAL RESECTION OF BLADDER TUMOR WITH MITOMYCIN-C N/A 09/12/2015   Procedure: TRANSURETHRAL RESECTION OF BLADDER TUMOR ;  Surgeon: [name], MD;  Location: [facility];  Service: Urology;  Laterality: N/A;   TRANSURETHRAL RESECTION OF BLADDER TUMOR WITH MITOMYCIN-C N/A 03/24/2016   Procedure: TRANSURETHRAL  RESECTION OF BLADDER TUMOR WITH MITOMYCIN-C  (SMALL);  Surgeon: [name], MD;  Location: [facility];  Service: Urology;  Laterality: N/A;   URETERAL BIOPSY Right 08/19/2017    Procedure: Renal Mass BIOPSY;  Surgeon: [name], MD;  Location: [facility];  Service: Urology;  Laterality: Right;   URETEROSCOPY Right 01/30/2015   Procedure: URETEROSCOPY/ WITH BIOPSY AND CYTOLOGY BRUSHING;  Surgeon: [name], MD;  Location: [facility];  Service: Urology;  Laterality: Right;   URETEROSCOPY Right 08/20/2015   Procedure: URETEROSCOPY;  Surgeon: [name], MD;  Location: [facility];  Service: Urology;  Laterality: Right;   URETEROSCOPY Right 09/12/2015   Procedure: URETEROSCOPY;  Surgeon: [name], MD;  Location: [facility];  Service: Urology;  Laterality: Right;   URETEROSCOPY Right 02/26/2016   Procedure: URETEROSCOPY;  Surgeon: [name], MD;  Location: [facility];  Service: Urology;  Laterality: Right;    Social History   Socioeconomic History   Marital status: Married    Spouse name: Not on file   Number of children: Not on file   Years of education: Not on file   Highest education level: Not on file  Occupational History   Not on file  Social Needs   Financial resource strain: Not on file   Food insecurity    Worry: Not on file    Inability: Not on file   Transportation needs    Medical: Not on file    Non-medical: Not on file  Tobacco Use   Smoking status: Never Smoker   Smokeless tobacco: Never Used  Substance and Sexual Activity   Alcohol use: No    Alcohol/week: 0.0 standard drinks   Drug use: No   Sexual activity: Not on file  Lifestyle   Physical activity    Days per week: Not on file    Minutes per session: Not on file   Stress: Not on file  Relationships   Social connections    Talks on phone: Not on file    Gets together: Not on file    Attends religious service: Not on file    Active member of club or organization: Not on file    Attends meetings of clubs or organizations: Not on file    Relationship status: Not on file   Intimate partner violence    Fear of current or ex partner: Not on file     Emotionally abused: Not on file    Physically abused: Not on file    Forced sexual activity: Not on file  Other Topics Concern   Not on file  Social History Narrative   Not on file    Family History  Problem Relation Age of Onset   Hypertension Mother    Hypertension Father    Prostate cancer Brother    Kidney disease Neg Hx    Kidney cancer Neg Hx    Bladder Cancer Neg Hx      Current Outpatient Medications:    acetaminophen (TYLENOL) 500 MG tablet, Take 1,000 mg by mouth daily as needed for moderate pain. , Disp: , Rfl:    amLODipine (NORVASC) 5 MG tablet, Take 5 mg by mouth daily. , Disp: , Rfl:    benazepril-hydrochlorthiazide (LOTENSIN HCT) 20-12.5 MG tablet, Take 1 tablet by mouth daily. , Disp: , Rfl:    cetirizine (ZYRTEC) 10 MG tablet, Take 10 mg by mouth daily., Disp: , Rfl:    diazepam (VALIUM) 5 MG tablet, TAKE 1 TABLET (5 MG TOTAL) BY MOUTH  EVERY 12 (TWELVE) HOURS AS NEEDED FOR ANXIETY OR SLEEP, Disp: , Rfl:    docusate sodium (COLACE) 100 MG capsule, Take 100 mg by mouth 2 (two) times daily. , Disp: , Rfl:    fenofibrate micronized (LOFIBRA) 134 MG capsule, Take 134 mg by mouth daily before breakfast. , Disp: , Rfl:    fluticasone (FLONASE) 50 MCG/ACT nasal spray, Place 2 sprays into both nostrils at bedtime., Disp: , Rfl:    HYDROcodone-acetaminophen (NORCO/VICODIN) 5-325 MG tablet, Take 1 tablet by mouth every 6 (six) hours as needed for severe pain., Disp: 30 tablet, Rfl: 0   hydrocortisone cream 1 %, Apply 1 application topically daily as needed for itching., Disp: , Rfl:    loratadine (CLARITIN) 10 MG tablet, Take 10 mg by mouth daily., Disp: , Rfl:    mirabegron ER (MYRBETRIQ) 50 MG TB24 tablet, Take 1 tablet (50 mg total) by mouth daily., Disp: 30 tablet, Rfl: 11   Multiple Vitamin (MULTIVITAMIN WITH MINERALS) TABS tablet, Take 1 tablet by mouth daily., Disp: , Rfl:    nystatin (MYCOSTATIN/NYSTOP) powder, APPLY TO AFFECTED AREA 4 TIMES A  DAY, Disp: 15 g, Rfl: 0   Potassium 99 MG TABS, Take 99 mg by mouth at bedtime., Disp: , Rfl:    Probiotic Product (PROBIOTIC-10 PO), Take by mouth every morning., Disp: , Rfl:    psyllium (METAMUCIL) 58.6 % packet, Take 1 packet by mouth daily. , Disp: , Rfl:    tamsulosin (FLOMAX) 0.4 MG CAPS capsule, Take 1 capsule (0.4 mg total) by mouth daily., Disp: 90 capsule, Rfl: 3   triamcinolone ointment (KENALOG) 0.5 %, APPLY TO AFFECTED AREA TWICE A DAY, Disp: 60 g, Rfl: 0  Physical exam:  Vitals:   12/21/18 0853  BP: 124/74  Temp: (!) 97.1 F (36.2 C)  TempSrc: Tympanic  Weight: 197 lb 6.4 oz (89.5 kg)  Height: 6' (1.829 m)   Physical Exam Constitutional:      General: He is not in acute distress. HENT:     Head: Normocephalic and atraumatic.  Eyes:     Pupils: Pupils are equal, round, and reactive to light.  Neck:     Musculoskeletal: Normal range of motion.  Cardiovascular:     Rate and Rhythm: Normal rate and regular rhythm.     Heart sounds: Normal heart sounds.  Pulmonary:     Effort: Pulmonary effort is normal.     Breath sounds: Normal breath sounds.  Abdominal:     General: Bowel sounds are normal.     Palpations: Abdomen is soft.  Skin:    General: Skin is warm and dry.  Neurological:     Mental Status: He is alert and oriented to person, place, and time.      CMP Latest Ref Rng & Units 12/21/2018  Glucose 70 - 99 mg/dL 142(H)  BUN 8 - 23 mg/dL 22  Creatinine 0.61 - 1.24 mg/dL 1.25(H)  Sodium 135 - 145 mmol/L 135  Potassium 3.5 - 5.1 mmol/L 3.7  Chloride 98 - 111 mmol/L 101  CO2 22 - 32 mmol/L 27  Calcium 8.9 - 10.3 mg/dL 8.7(L)  Total Protein 6.5 - 8.1 g/dL 6.3(L)  Total Bilirubin 0.3 - 1.2 mg/dL 0.5  Alkaline Phos 38 - 126 U/L 49  AST 15 - 41 U/L 29  ALT 0 - 44 U/L 20   CBC Latest Ref Rng & Units 12/21/2018  WBC 4.0 - 10.5 K/uL 5.0  Hemoglobin 13.0 - 17.0 g/dL 12.9(L)  Hematocrit  39.0 - 52.0 % 37.5(L)  Platelets 150 - 400 K/uL 204    No  images are attached to the encounter.  Ct Chest W Contrast  Result Date: 12/14/2018 CLINICAL DATA:  Restaging urothelial carcinoma EXAM: CT CHEST, ABDOMEN, AND PELVIS WITH CONTRAST TECHNIQUE: Multidetector CT imaging of the chest, abdomen and pelvis was performed following the standard protocol during bolus administration of intravenous contrast. CONTRAST:  125mL OMNIPAQUE IOHEXOL 300 MG/ML  SOLN COMPARISON:  09/17/2018 FINDINGS: CT CHEST FINDINGS Cardiovascular: The heart size appears within normal limits. No pericardial effusion identified. Aortic atherosclerosis. Lad coronary artery atherosclerotic calcifications. Mediastinum/Nodes: Normal appearance of the thyroid gland. The trachea appears patent and is midline. Normal appearance of the esophagus. No mediastinal or hilar adenopathy. Lungs/Pleura: No pleural effusion identified. Medial right upper lobe lung nodule is unchanged measuring 1.3 cm, image 82/3. 4 mm left lower lobe lung nodule is identified, image 90/3. Previously 5 mm. Adjacent nodule in the left lower lobe measures 2 mm, image 89/3. Previously 3 mm. No new pulmonary nodules. Musculoskeletal: No chest wall mass or suspicious bone lesions identified. CT ABDOMEN PELVIS FINDINGS Hepatobiliary: No suspicious liver lesion. Stones identified within the gallbladder which measure up to 5 mm. No gallbladder wall inflammation. No bile duct dilatation. Pancreas: Previously identified cystic lesion within uncinate process of pancreas measures 1.6 cm on today's exam, image 84/2. Previously this measured 1.7 cm. No main duct dilatation or inflammation. Spleen: Normal in size without focal abnormality. Adrenals/Urinary Tract: Normal appearance of the adrenal glands. Cyst within anterior cortex of inferior pole of right kidney measures 4.4 cm, image 84/2. Small left kidney cysts are also identified, image 75/2. No hydronephrosis identified bilaterally. Tumor involving the upper pole collecting system of the  right kidney lesion measures approximately 1.9 cm, image 92/4. On the previous exam this measured 2.4 cm. Within the lower pole collecting system of the right kidney measures approximately 1.2 cm, similar to previous exam, image 90/4. Delayed imaging was not carried out through the pelvis therefore limited evaluation of the ureters bilaterally. The previously noted hyperdense/enhancing nodule within the bladder near the right UVJ is not confidently identified on today's study. Left posterior bladder wall diverticula identified. Stomach/Bowel: Stomach is normal. Small bowel loops are unremarkable. No bowel wall thickening, inflammation or infection. The appendix is visualized and appears normal. Distal colonic diverticulosis without acute inflammation. Vascular/Lymphatic: Aortic atherosclerosis. No aneurysm. No abdominal adenopathy. Previous right pelvic sidewall lymph node measures 1.7 cm short axis, image 117/2. Unchanged. No progressive adenopathy identified. Reproductive: Prostate gland is unremarkable. Other: No free fluid or fluid collections identified Musculoskeletal: No acute or significant osseous findings. IMPRESSION: 1. Similar appearance of enhancing urothelial lesions within the right renal collecting system. Not significantly changed when compared with previous exam. 2. Right pelvic sidewall lymph node is unchanged. 3. Continued regression of small nodules within the left lower lobe. Unchanged 1.3 cm right upper lobe lung nodule. 4. Stable cystic lesion within uncinate process of the pancreas. Favor benign pseudocyst or side branch IPMN. Attention on follow-up imaging advised. 5.  Aortic Atherosclerosis (ICD10-I70.0). Electronically Signed   By: Kerby Moors M.D.   On: 12/14/2018 12:24   Ct Abdomen Pelvis W Contrast  Result Date: 12/14/2018 CLINICAL DATA:  Restaging urothelial carcinoma EXAM: CT CHEST, ABDOMEN, AND PELVIS WITH CONTRAST TECHNIQUE: Multidetector CT imaging of the chest, abdomen and  pelvis was performed following the standard protocol during bolus administration of intravenous contrast. CONTRAST:  156mL OMNIPAQUE IOHEXOL 300 MG/ML  SOLN COMPARISON:  09/17/2018 FINDINGS: CT CHEST FINDINGS Cardiovascular: The heart size appears within normal limits. No pericardial effusion identified. Aortic atherosclerosis. Lad coronary artery atherosclerotic calcifications. Mediastinum/Nodes: Normal appearance of the thyroid gland. The trachea appears patent and is midline. Normal appearance of the esophagus. No mediastinal or hilar adenopathy. Lungs/Pleura: No pleural effusion identified. Medial right upper lobe lung nodule is unchanged measuring 1.3 cm, image 82/3. 4 mm left lower lobe lung nodule is identified, image 90/3. Previously 5 mm. Adjacent nodule in the left lower lobe measures 2 mm, image 89/3. Previously 3 mm. No new pulmonary nodules. Musculoskeletal: No chest wall mass or suspicious bone lesions identified. CT ABDOMEN PELVIS FINDINGS Hepatobiliary: No suspicious liver lesion. Stones identified within the gallbladder which measure up to 5 mm. No gallbladder wall inflammation. No bile duct dilatation. Pancreas: Previously identified cystic lesion within uncinate process of pancreas measures 1.6 cm on today's exam, image 84/2. Previously this measured 1.7 cm. No main duct dilatation or inflammation. Spleen: Normal in size without focal abnormality. Adrenals/Urinary Tract: Normal appearance of the adrenal glands. Cyst within anterior cortex of inferior pole of right kidney measures 4.4 cm, image 84/2. Small left kidney cysts are also identified, image 75/2. No hydronephrosis identified bilaterally. Tumor involving the upper pole collecting system of the right kidney lesion measures approximately 1.9 cm, image 92/4. On the previous exam this measured 2.4 cm. Within the lower pole collecting system of the right kidney measures approximately 1.2 cm, similar to previous exam, image 90/4. Delayed imaging  was not carried out through the pelvis therefore limited evaluation of the ureters bilaterally. The previously noted hyperdense/enhancing nodule within the bladder near the right UVJ is not confidently identified on today's study. Left posterior bladder wall diverticula identified. Stomach/Bowel: Stomach is normal. Small bowel loops are unremarkable. No bowel wall thickening, inflammation or infection. The appendix is visualized and appears normal. Distal colonic diverticulosis without acute inflammation. Vascular/Lymphatic: Aortic atherosclerosis. No aneurysm. No abdominal adenopathy. Previous right pelvic sidewall lymph node measures 1.7 cm short axis, image 117/2. Unchanged. No progressive adenopathy identified. Reproductive: Prostate gland is unremarkable. Other: No free fluid or fluid collections identified Musculoskeletal: No acute or significant osseous findings. IMPRESSION: 1. Similar appearance of enhancing urothelial lesions within the right renal collecting system. Not significantly changed when compared with previous exam. 2. Right pelvic sidewall lymph node is unchanged. 3. Continued regression of small nodules within the left lower lobe. Unchanged 1.3 cm right upper lobe lung nodule. 4. Stable cystic lesion within uncinate process of the pancreas. Favor benign pseudocyst or side branch IPMN. Attention on follow-up imaging advised. 5.  Aortic Atherosclerosis (ICD10-I70.0). Electronically Signed   By: Kerby Moors M.D.   On: 12/14/2018 12:24     Assessment and plan- Patient is a 85 y.o. male with metastatic urothelial carcinoma stage IV cT2 N0 M1 with metastases to the obturator lymph node as well as hilar adenopathy and right upper lobe lung nodule.He is here for next cycle of palliative Keytruda  Counts okay to proceed with next cycle of palliative Keytruda today.  I have reviewed CT chest abdomen and pelvis images independently and discussed findings with the patient.  Overall patient has  stable disease.  Right upper lobe lung nodule as well as primary pelvic mass and obturator lymph node are stable in size.  No evidence of progressive disease.  We will continue Keytruda until progression or toxicity.  Drug-induced skin rash: Likely secondary to [facility].  It is mild and grade 1 controlled with topical steroids.  Continue to monitor  I will see him back in 3 weeks time with port labs CBC with differential, CMP prior to next cycle of Keytruda  AKI: Mild serum creatinine mildly elevated at 1.2.  I will hold off on giving him IV fluids at this time but have encouraged him to increase his oral fluid intake over the next few days.  Patient verbalized understanding   Visit Diagnosis 1. Encounter for antineoplastic immunotherapy   2. 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 AKI (acute kidney injury) ([place])      Dr. Randa Evens, MD, MPH [facility] [phone] 12/21/2018 9:01 AM

## 2018-12-21 NOTE — Progress Notes (Signed)
Pt still has rash on abdomen and back. He had one spot in left groin and it was inflamed and he opened it and pus came out and now it Is 99% healed per pt

## 2018-12-21 NOTE — Addendum Note (Signed)
Addended by: Randa Evens C on: 12/21/2018 09:26 AM   Modules accepted: Orders

## 2018-12-28 ENCOUNTER — Telehealth: Payer: Self-pay | Admitting: *Deleted

## 2018-12-28 NOTE — Telephone Encounter (Signed)
I called pt to tell him about his appt on 11/3 at 9 am for labs, se md and treatment. There was no answer and after several rings the phone disconnect. I have put his schedule in the mail for him

## 2019-01-09 ENCOUNTER — Other Ambulatory Visit: Payer: Self-pay | Admitting: Oncology

## 2019-01-10 NOTE — Progress Notes (Signed)
Called patient no answer left message  

## 2019-01-11 ENCOUNTER — Other Ambulatory Visit: Payer: Self-pay

## 2019-01-11 ENCOUNTER — Inpatient Hospital Stay: Payer: Medicare HMO | Attending: Oncology

## 2019-01-11 ENCOUNTER — Telehealth: Payer: Self-pay | Admitting: *Deleted

## 2019-01-11 ENCOUNTER — Encounter: Payer: Self-pay | Admitting: Oncology

## 2019-01-11 ENCOUNTER — Inpatient Hospital Stay: Payer: Medicare HMO

## 2019-01-11 ENCOUNTER — Inpatient Hospital Stay (HOSPITAL_BASED_OUTPATIENT_CLINIC_OR_DEPARTMENT_OTHER): Payer: Medicare HMO | Admitting: Oncology

## 2019-01-11 VITALS — BP 126/71 | HR 86 | Temp 98.1°F | Resp 16 | Ht 72.0 in | Wt 196.8 lb

## 2019-01-11 DIAGNOSIS — Z882 Allergy status to sulfonamides status: Secondary | ICD-10-CM | POA: Insufficient documentation

## 2019-01-11 DIAGNOSIS — E871 Hypo-osmolality and hyponatremia: Secondary | ICD-10-CM | POA: Insufficient documentation

## 2019-01-11 DIAGNOSIS — Z8249 Family history of ischemic heart disease and other diseases of the circulatory system: Secondary | ICD-10-CM | POA: Diagnosis not present

## 2019-01-11 DIAGNOSIS — C676 Malignant neoplasm of ureteric orifice: Secondary | ICD-10-CM | POA: Insufficient documentation

## 2019-01-11 DIAGNOSIS — L308 Other specified dermatitis: Secondary | ICD-10-CM

## 2019-01-11 DIAGNOSIS — C669 Malignant neoplasm of unspecified ureter: Secondary | ICD-10-CM | POA: Diagnosis not present

## 2019-01-11 DIAGNOSIS — Z85828 Personal history of other malignant neoplasm of skin: Secondary | ICD-10-CM | POA: Insufficient documentation

## 2019-01-11 DIAGNOSIS — L309 Dermatitis, unspecified: Secondary | ICD-10-CM | POA: Diagnosis not present

## 2019-01-11 DIAGNOSIS — Z8042 Family history of malignant neoplasm of prostate: Secondary | ICD-10-CM | POA: Insufficient documentation

## 2019-01-11 DIAGNOSIS — C771 Secondary and unspecified malignant neoplasm of intrathoracic lymph nodes: Secondary | ICD-10-CM | POA: Diagnosis present

## 2019-01-11 DIAGNOSIS — Z885 Allergy status to narcotic agent status: Secondary | ICD-10-CM | POA: Diagnosis not present

## 2019-01-11 DIAGNOSIS — N400 Enlarged prostate without lower urinary tract symptoms: Secondary | ICD-10-CM | POA: Insufficient documentation

## 2019-01-11 DIAGNOSIS — I700 Atherosclerosis of aorta: Secondary | ICD-10-CM | POA: Diagnosis not present

## 2019-01-11 DIAGNOSIS — C7802 Secondary malignant neoplasm of left lung: Secondary | ICD-10-CM | POA: Insufficient documentation

## 2019-01-11 DIAGNOSIS — Z79899 Other long term (current) drug therapy: Secondary | ICD-10-CM | POA: Diagnosis not present

## 2019-01-11 DIAGNOSIS — Z95828 Presence of other vascular implants and grafts: Secondary | ICD-10-CM

## 2019-01-11 DIAGNOSIS — Z87442 Personal history of urinary calculi: Secondary | ICD-10-CM | POA: Diagnosis not present

## 2019-01-11 DIAGNOSIS — M1990 Unspecified osteoarthritis, unspecified site: Secondary | ICD-10-CM | POA: Insufficient documentation

## 2019-01-11 DIAGNOSIS — I2510 Atherosclerotic heart disease of native coronary artery without angina pectoris: Secondary | ICD-10-CM | POA: Insufficient documentation

## 2019-01-11 LAB — CBC WITH DIFFERENTIAL/PLATELET
Abs Immature Granulocytes: 0.01 10*3/uL (ref 0.00–0.07)
Basophils Absolute: 0.1 10*3/uL (ref 0.0–0.1)
Basophils Relative: 1 %
Eosinophils Absolute: 0.3 10*3/uL (ref 0.0–0.5)
Eosinophils Relative: 7 %
HCT: 38.3 % — ABNORMAL LOW (ref 39.0–52.0)
Hemoglobin: 13.2 g/dL (ref 13.0–17.0)
Immature Granulocytes: 0 %
Lymphocytes Relative: 29 %
Lymphs Abs: 1.5 10*3/uL (ref 0.7–4.0)
MCH: 30 pg (ref 26.0–34.0)
MCHC: 34.5 g/dL (ref 30.0–36.0)
MCV: 87 fL (ref 80.0–100.0)
Monocytes Absolute: 0.5 10*3/uL (ref 0.1–1.0)
Monocytes Relative: 10 %
Neutro Abs: 2.7 10*3/uL (ref 1.7–7.7)
Neutrophils Relative %: 53 %
Platelets: 213 10*3/uL (ref 150–400)
RBC: 4.4 MIL/uL (ref 4.22–5.81)
RDW: 13.3 % (ref 11.5–15.5)
WBC: 5.1 10*3/uL (ref 4.0–10.5)
nRBC: 0 % (ref 0.0–0.2)

## 2019-01-11 LAB — COMPREHENSIVE METABOLIC PANEL
ALT: 20 U/L (ref 0–44)
AST: 29 U/L (ref 15–41)
Albumin: 3.6 g/dL (ref 3.5–5.0)
Alkaline Phosphatase: 46 U/L (ref 38–126)
Anion gap: 8 (ref 5–15)
BUN: 21 mg/dL (ref 8–23)
CO2: 27 mmol/L (ref 22–32)
Calcium: 8.7 mg/dL — ABNORMAL LOW (ref 8.9–10.3)
Chloride: 99 mmol/L (ref 98–111)
Creatinine, Ser: 1.27 mg/dL — ABNORMAL HIGH (ref 0.61–1.24)
GFR calc Af Amer: 59 mL/min — ABNORMAL LOW (ref >60)
GFR calc non Af Amer: 51 mL/min — ABNORMAL LOW (ref >60)
Glucose, Bld: 145 mg/dL — ABNORMAL HIGH (ref 70–99)
Potassium: 3.8 mmol/L (ref 3.5–5.1)
Sodium: 134 mmol/L — ABNORMAL LOW (ref 135–145)
Total Bilirubin: 0.7 mg/dL (ref 0.3–1.2)
Total Protein: 6.4 g/dL — ABNORMAL LOW (ref 6.5–8.1)

## 2019-01-11 LAB — TSH: TSH: 0.963 u[IU]/mL (ref 0.350–4.500)

## 2019-01-11 MED ORDER — PREDNISONE 10 MG PO TABS: 10.0000 mg | ORAL_TABLET | ORAL | 0 refills | Status: DC

## 2019-01-11 MED ORDER — SODIUM CHLORIDE 0.9% FLUSH
10.0000 mL | Freq: Once | INTRAVENOUS | Status: AC
  Administered 2019-01-11: 10 mL via INTRAVENOUS
  Filled 2019-01-11: qty 10

## 2019-01-11 NOTE — Telephone Encounter (Signed)
Called pt back and told pt that I faxed it about 3 hours ago to pharmacy and It is prednisone. I went over the directions on the message and said to call if the pharmacy did not get it but I faxed it and got confirmation

## 2019-01-11 NOTE — Progress Notes (Signed)
Pt itching worse in last week. He has rash head, back , stomach, legs.arms. he wants to know if he can take zyrtec 2 times a day. He states that he has valium and that helps the itching most of all.

## 2019-01-11 NOTE — Telephone Encounter (Signed)
Patient states thyroid medicine has not been sent to pharmacy

## 2019-01-12 ENCOUNTER — Telehealth: Payer: Self-pay | Admitting: *Deleted

## 2019-01-12 NOTE — Telephone Encounter (Signed)
I called the pharmacy, the pharmacist said that she found the rx on fax yest. Evening but it was after pt called about it. Then pt called back to today and I told him that pharmacist had found it and it is ready. Pharmacist tried calling but no one answered phone and she did not leave a message. Pt going to pick it up today

## 2019-01-12 NOTE — Telephone Encounter (Signed)
Patient called and said pharmacy claims they did not get prescription for Prednisone and he is requesting that it be resent to them

## 2019-01-13 NOTE — Progress Notes (Signed)
Hematology/Oncology Consult note [facility]  Telephone:([phone] [phone]  Patient Care Team: Idelle Crouch, MD as PCP - General (Internal Medicine)   Name of the patient: Anthony Gonzales  [id number]  April 02, 1931   Date of visit: 01/13/19  Diagnosis- Metastatic urothelial carcinoma with possible mets to the obturator node. Indeterminate hilar lymph nodes and LUL lung nodule  Chief complaint/ Reason for visit-on treatment assessment prior to next cycle of Keytruda  Heme/Onc history: patient is a 85 year old male with past medical history significant for hypertension and long-standing history of superficial bladder cancer for which he sees [name]. He has undergone TURBT as well as ureteroscopy since 2017 along with mitomycin as well in the past. Most recently he underwent CT abdomen on 08/06/2017 which showed a soft tissue mass in the right renal pelvis concerning for upper tract urothelial neoplasm. Soft tissue fullness at the right ureterovesical junction with proximal right hydroureteronephrosis. Several millimeter attenuation lesion in the pancreatic head.  He underwent diagnostic ureteroscopy and was found to have 2 high-grade lesions within his right kidney. There was a nodular high-grade appearing lesion in the right anterior renal pelvis. There was also a second ureteral tumor fungating from the right ureteral orifice extending into the distal ureter also consistent with high-grade invasive urothelial carcinoma. Muscle invasion could not be assessed. He also underwent a CT chest which showed a rounded nodule in the right upper lobe measuring 14 mm concerning for metastases. 2 other 3 mm lesions were also noted in the left upper lobe likely benign  Plan initially was neoadjuvant chemotherapy followed by possible surgery but given the presence of lung lesion patient has been referred to oncology for the same.  PET/CT on  09/21/17 showed:IMPRESSION: 1. Hypermetabolic right obturator lymph node is most indicative of metastatic disease. 2. Mildly hypermetabolic left hilar lymph nodes are nonspecific. Continued attention on follow-up exams is warranted. 3. Right upper lobe pulmonary nodule shows metabolism after just above blood pool and is therefore indeterminate. Continued attention on follow-up exams is warranted. 4. Aortic atherosclerosis (ICD10-170.0). Coronary artery calcification. 5. Cholelithiasis  Carboplatin/ gemzar1 week on and one-week off as patient could not tolerate 2-week on and one week off regimen. Cycle 1 started on 09/22/2017 Disease progression in March 2020. Switched to second [employer]  Interval history-patient's rash has recurred and he has scattered lesions all over his body including his scalp.  Some of them also have associated itching.  Denies other complaints  ECOG PS- 1 Pain scale- 0   Review of systems- Review of Systems  Constitutional: Negative for chills, fever, malaise/fatigue and weight loss.  HENT: Negative for congestion, ear discharge and nosebleeds.   Eyes: Negative for blurred vision.  Respiratory: Negative for cough, hemoptysis, sputum production, shortness of breath and wheezing.   Cardiovascular: Negative for chest pain, palpitations, orthopnea and claudication.  Gastrointestinal: Negative for abdominal pain, blood in stool, constipation, diarrhea, heartburn, melena, nausea and vomiting.  Genitourinary: Negative for dysuria, flank pain, frequency, hematuria and urgency.  Musculoskeletal: Negative for back pain, joint pain and myalgias.  Skin: Positive for rash.  Neurological: Negative for dizziness, tingling, focal weakness, seizures, weakness and headaches.  Endo/Heme/Allergies: Does not bruise/bleed easily.  Psychiatric/Behavioral: Negative for depression and suicidal ideas. The patient does not have insomnia.       Allergies  Allergen  Reactions   Demerol [Meperidine] Nausea And Vomiting   Lipitor [Atorvastatin] Swelling   Sulfa Antibiotics Nausea And Vomiting and Rash  Past Medical History:  Diagnosis Date   Arthritis    Benign fibroma of prostate 08/23/2013   BPH (benign prostatic hyperplasia)    Calculus of kidney 08/23/2013   Glaucoma    no drops in 3 mo pressure good, pt denies glaucoma, eye pressure has been measuring alright.   History of kidney stones    HLD (hyperlipidemia)    HOH (hard of hearing)    Left Hearing Aid   HTN (hypertension) 12/26/2014   Hypertension    Hyponatremia 12/26/2014   Migraines    history of migraines when he was younger.   Restless leg syndrome    Sinus drainage    Skin cancer    Urothelial cancer ([place])    chemo tx's.   UTI (lower urinary tract infection) 12/26/2014   Vertigo      Past Surgical History:  Procedure Laterality Date   COLONOSCOPY     CYSTOSCOPY W/ RETROGRADES Right 01/30/2015   Procedure: CYSTOSCOPY WITH RETROGRADE PYELOGRAM;  Surgeon: [name], MD;  Location: [facility];  Service: Urology;  Laterality: Right;   CYSTOSCOPY W/ RETROGRADES Bilateral 02/26/2016   Procedure: CYSTOSCOPY WITH RETROGRADE PYELOGRAM;  Surgeon: [name], MD;  Location: [facility];  Service: Urology;  Laterality: Bilateral;   CYSTOSCOPY W/ URETERAL STENT PLACEMENT Right 08/20/2015   Procedure: CYSTOSCOPY WITH RETROGRADE PYELOGRAM/POSSIBLE URETERAL STENT PLACEMENT/BLADDER BIOPSY;  Surgeon: [name], MD;  Location: [facility];  Service: Urology;  Laterality: Right;   CYSTOSCOPY W/ URETERAL STENT PLACEMENT Right 09/12/2015   Procedure: CYSTOSCOPY WITH STENT REPLACEMENT;  Surgeon: [name], MD;  Location: [facility];  Service: Urology;  Laterality: Right;   CYSTOSCOPY WITH BIOPSY Right 09/12/2015   Procedure: CYSTOSCOPY WITH BLADDER AND URETERAL BIOPSY;  Surgeon: [name], MD;  Location: [facility];  Service: Urology;  Laterality: Right;    CYSTOSCOPY WITH STENT PLACEMENT Right 01/30/2015   Procedure: CYSTOSCOPY WITH STENT PLACEMENT;  Surgeon: [name], MD;  Location: [facility];  Service: Urology;  Laterality: Right;   CYSTOSCOPY WITH STENT PLACEMENT Right 12/28/2017   Procedure: CYSTOSCOPY WITH STENT Exchange;  Surgeon: [name], MD;  Location: [facility];  Service: Urology;  Laterality: Right;   CYSTOSCOPY/URETEROSCOPY/HOLMIUM LASER/STENT PLACEMENT Right 08/19/2017   Procedure: CYSTOSCOPY/URETEROSCOPY/HOLMIUM LASER/STENT PLACEMENT;  Surgeon: [name], MD;  Location: [facility];  Service: Urology;  Laterality: Right;   EYE SURGERY Bilateral    Cataract Extraction with IOL   goiter removal     HOLMIUM LASER APPLICATION N/A 8/41/3244   Procedure:  HOLMIUM LASER APPLICATION;  Surgeon: [name], MD;  Location: [facility];  Service: Urology;  Laterality: N/A;   PORTA CATH INSERTION N/A 09/23/2017   Procedure: PORTA CATH INSERTION;  Surgeon: Algernon Huxley, MD;  Location: [facility];  Service: Cardiovascular;  Laterality: N/A;   SPERMATOCELECTOMY     TONSILLECTOMY     TRANSURETHRAL RESECTION OF BLADDER TUMOR N/A 02/26/2016   Procedure: TRANSURETHRAL RESECTION OF BLADDER TUMOR (TURBT);  Surgeon: [name], MD;  Location: [facility];  Service: Urology;  Laterality: N/A;   TRANSURETHRAL RESECTION OF BLADDER TUMOR WITH MITOMYCIN-C N/A 09/12/2015   Procedure: TRANSURETHRAL RESECTION OF BLADDER TUMOR ;  Surgeon: [name], MD;  Location: [facility];  Service: Urology;  Laterality: N/A;   TRANSURETHRAL RESECTION OF BLADDER TUMOR WITH MITOMYCIN-C N/A 03/24/2016   Procedure: TRANSURETHRAL RESECTION OF BLADDER TUMOR WITH MITOMYCIN-C  (SMALL);  Surgeon: [name], MD;  Location: [facility];  Service: Urology;  Laterality: N/A;   URETERAL BIOPSY Right 08/19/2017  Procedure: Renal Mass BIOPSY;  Surgeon: [name], MD;  Location: [facility];  Service: Urology;  Laterality: Right;   URETEROSCOPY Right  01/30/2015   Procedure: URETEROSCOPY/ WITH BIOPSY AND CYTOLOGY BRUSHING;  Surgeon: [name], MD;  Location: [facility];  Service: Urology;  Laterality: Right;   URETEROSCOPY Right 08/20/2015   Procedure: URETEROSCOPY;  Surgeon: [name], MD;  Location: [facility];  Service: Urology;  Laterality: Right;   URETEROSCOPY Right 09/12/2015   Procedure: URETEROSCOPY;  Surgeon: [name], MD;  Location: [facility];  Service: Urology;  Laterality: Right;   URETEROSCOPY Right 02/26/2016   Procedure: URETEROSCOPY;  Surgeon: [name], MD;  Location: [facility];  Service: Urology;  Laterality: Right;    Social History   Socioeconomic History   Marital status: Married    Spouse name: Not on file   Number of children: Not on file   Years of education: Not on file   Highest education level: Not on file  Occupational History   Not on file  Social Needs   Financial resource strain: Not on file   Food insecurity    Worry: Not on file    Inability: Not on file   Transportation needs    Medical: Not on file    Non-medical: Not on file  Tobacco Use   Smoking status: Never Smoker   Smokeless tobacco: Never Used  Substance and Sexual Activity   Alcohol use: No    Alcohol/week: 0.0 standard drinks   Drug use: No   Sexual activity: Not on file  Lifestyle   Physical activity    Days per week: Not on file    Minutes per session: Not on file   Stress: Not on file  Relationships   Social connections    Talks on phone: Not on file    Gets together: Not on file    Attends religious service: Not on file    Active member of club or organization: Not on file    Attends meetings of clubs or organizations: Not on file    Relationship status: Not on file   Intimate partner violence    Fear of current or ex partner: Not on file    Emotionally abused: Not on file    Physically abused: Not on file    Forced sexual activity: Not on file  Other Topics Concern   Not on  file  Social History Narrative   Not on file    Family History  Problem Relation Age of Onset   Hypertension Mother    Hypertension Father    Prostate cancer Brother    Kidney disease Neg Hx    Kidney cancer Neg Hx    Bladder Cancer Neg Hx      Current Outpatient Medications:    acetaminophen (TYLENOL) 500 MG tablet, Take 1,000 mg by mouth daily as needed for moderate pain. , Disp: , Rfl:    amLODipine (NORVASC) 5 MG tablet, Take 5 mg by mouth daily. , Disp: , Rfl:    benazepril-hydrochlorthiazide (LOTENSIN HCT) 20-12.5 MG tablet, Take 1 tablet by mouth daily. , Disp: , Rfl:    cetirizine (ZYRTEC) 10 MG tablet, Take 10 mg by mouth daily., Disp: , Rfl:    diazepam (VALIUM) 5 MG tablet, TAKE 1 TABLET (5 MG TOTAL) BY MOUTH EVERY 12 (TWELVE) HOURS AS NEEDED FOR ANXIETY OR SLEEP, Disp: , Rfl:    docusate sodium (COLACE) 100 MG capsule, Take 100 mg by mouth 2 (two) times daily. ,  Disp: , Rfl:    fenofibrate micronized (LOFIBRA) 134 MG capsule, Take 134 mg by mouth daily before breakfast. , Disp: , Rfl:    fluticasone (FLONASE) 50 MCG/ACT nasal spray, Place 2 sprays into both nostrils at bedtime., Disp: , Rfl:    hydrocortisone cream 1 %, Apply 1 application topically daily as needed for itching., Disp: , Rfl:    Multiple Vitamin (MULTIVITAMIN WITH MINERALS) TABS tablet, Take 1 tablet by mouth daily., Disp: , Rfl:    nystatin (MYCOSTATIN/NYSTOP) powder, APPLY TO AFFECTED AREA 4 TIMES A DAY, Disp: 15 g, Rfl: 0   Potassium 99 MG TABS, Take 99 mg by mouth at bedtime., Disp: , Rfl:    psyllium (METAMUCIL) 58.6 % packet, Take 1 packet by mouth daily. , Disp: , Rfl:    tamsulosin (FLOMAX) 0.4 MG CAPS capsule, Take 1 capsule (0.4 mg total) by mouth daily., Disp: 90 capsule, Rfl: 3   triamcinolone ointment (KENALOG) 0.5 %, APPLY TO AFFECTED AREA TWICE A DAY, Disp: 60 g, Rfl: 0   HYDROcodone-acetaminophen (NORCO/VICODIN) 5-325 MG tablet, Take 1 tablet by mouth every 6 (six)  hours as needed for severe pain. (Patient not taking: Reported on 01/11/2019), Disp: 30 tablet, Rfl: 0   mirabegron ER (MYRBETRIQ) 50 MG TB24 tablet, Take 1 tablet (50 mg total) by mouth daily. (Patient not taking: Reported on 01/11/2019), Disp: 30 tablet, Rfl: 11   predniSONE (DELTASONE) 10 MG tablet, Take 1 tablet (10 mg total) by mouth as directed. Start 5 tablet daily x 2 days, then 4 tablets daily x 2 days, 3 tablets x 2 days, 2 tablets daily x 2 days, 1 tablet daily x 2 days 1/2 tablet daily x 2 days ( always take the med on full stomach), Disp: 27 tablet, Rfl: 0  Physical exam:  Vitals:   01/11/19 0928  BP: 126/71  Pulse: 86  Resp: 16  Temp: 98.1 F (36.7 C)  TempSrc: Tympanic  Weight: 196 lb 12.8 oz (89.3 kg)  Height: 6' (1.829 m)   Physical Exam Constitutional:      General: He is not in acute distress. HENT:     Head: Normocephalic and atraumatic.  Eyes:     Pupils: Pupils are equal, round, and reactive to light.  Neck:     Musculoskeletal: Normal range of motion.  Cardiovascular:     Rate and Rhythm: Normal rate and regular rhythm.     Heart sounds: Normal heart sounds.  Pulmonary:     Effort: Pulmonary effort is normal.     Breath sounds: Normal breath sounds.  Abdominal:     General: Bowel sounds are normal.     Palpations: Abdomen is soft.  Skin:    Comments: Scattered erythematous maculopapular rash seen all over the back and the trunk as well as scalp and extremities  Neurological:     Mental Status: He is alert and oriented to person, place, and time.      CMP Latest Ref Rng & Units 01/11/2019  Glucose 70 - 99 mg/dL 145(H)  BUN 8 - 23 mg/dL 21  Creatinine 0.61 - 1.24 mg/dL 1.27(H)  Sodium 135 - 145 mmol/L 134(L)  Potassium 3.5 - 5.1 mmol/L 3.8  Chloride 98 - 111 mmol/L 99  CO2 22 - 32 mmol/L 27  Calcium 8.9 - 10.3 mg/dL 8.7(L)  Total Protein 6.5 - 8.1 g/dL 6.4(L)  Total Bilirubin 0.3 - 1.2 mg/dL 0.7  Alkaline Phos 38 - 126 U/L 46  AST 15 - 41 U/L  29  ALT 0 - 44 U/L 20   CBC Latest Ref Rng & Units 01/11/2019  WBC 4.0 - 10.5 K/uL 5.1  Hemoglobin 13.0 - 17.0 g/dL 13.2  Hematocrit 39.0 - 52.0 % 38.3(L)  Platelets 150 - 400 K/uL 213        Ct Chest W Contrast  Result Date: 12/14/2018 CLINICAL DATA:  Restaging urothelial carcinoma EXAM: CT CHEST, ABDOMEN, AND PELVIS WITH CONTRAST TECHNIQUE: Multidetector CT imaging of the chest, abdomen and pelvis was performed following the standard protocol during bolus administration of intravenous contrast. CONTRAST:  153mL OMNIPAQUE IOHEXOL 300 MG/ML  SOLN COMPARISON:  09/17/2018 FINDINGS: CT CHEST FINDINGS Cardiovascular: The heart size appears within normal limits. No pericardial effusion identified. Aortic atherosclerosis. Lad coronary artery atherosclerotic calcifications. Mediastinum/Nodes: Normal appearance of the thyroid gland. The trachea appears patent and is midline. Normal appearance of the esophagus. No mediastinal or hilar adenopathy. Lungs/Pleura: No pleural effusion identified. Medial right upper lobe lung nodule is unchanged measuring 1.3 cm, image 82/3. 4 mm left lower lobe lung nodule is identified, image 90/3. Previously 5 mm. Adjacent nodule in the left lower lobe measures 2 mm, image 89/3. Previously 3 mm. No new pulmonary nodules. Musculoskeletal: No chest wall mass or suspicious bone lesions identified. CT ABDOMEN PELVIS FINDINGS Hepatobiliary: No suspicious liver lesion. Stones identified within the gallbladder which measure up to 5 mm. No gallbladder wall inflammation. No bile duct dilatation. Pancreas: Previously identified cystic lesion within uncinate process of pancreas measures 1.6 cm on today's exam, image 84/2. Previously this measured 1.7 cm. No main duct dilatation or inflammation. Spleen: Normal in size without focal abnormality. Adrenals/Urinary Tract: Normal appearance of the adrenal glands. Cyst within anterior cortex of inferior pole of right kidney measures 4.4 cm, image  84/2. Small left kidney cysts are also identified, image 75/2. No hydronephrosis identified bilaterally. Tumor involving the upper pole collecting system of the right kidney lesion measures approximately 1.9 cm, image 92/4. On the previous exam this measured 2.4 cm. Within the lower pole collecting system of the right kidney measures approximately 1.2 cm, similar to previous exam, image 90/4. Delayed imaging was not carried out through the pelvis therefore limited evaluation of the ureters bilaterally. The previously noted hyperdense/enhancing nodule within the bladder near the right UVJ is not confidently identified on today's study. Left posterior bladder wall diverticula identified. Stomach/Bowel: Stomach is normal. Small bowel loops are unremarkable. No bowel wall thickening, inflammation or infection. The appendix is visualized and appears normal. Distal colonic diverticulosis without acute inflammation. Vascular/Lymphatic: Aortic atherosclerosis. No aneurysm. No abdominal adenopathy. Previous right pelvic sidewall lymph node measures 1.7 cm short axis, image 117/2. Unchanged. No progressive adenopathy identified. Reproductive: Prostate gland is unremarkable. Other: No free fluid or fluid collections identified Musculoskeletal: No acute or significant osseous findings. IMPRESSION: 1. Similar appearance of enhancing urothelial lesions within the right renal collecting system. Not significantly changed when compared with previous exam. 2. Right pelvic sidewall lymph node is unchanged. 3. Continued regression of small nodules within the left lower lobe. Unchanged 1.3 cm right upper lobe lung nodule. 4. Stable cystic lesion within uncinate process of the pancreas. Favor benign pseudocyst or side branch IPMN. Attention on follow-up imaging advised. 5.  Aortic Atherosclerosis (ICD10-I70.0). Electronically Signed   By: Kerby Moors M.D.   On: 12/14/2018 12:24   Ct Abdomen Pelvis W Contrast  Result Date:  12/14/2018 CLINICAL DATA:  Restaging urothelial carcinoma EXAM: CT CHEST, ABDOMEN, AND PELVIS WITH CONTRAST TECHNIQUE: Multidetector CT imaging of  the chest, abdomen and pelvis was performed following the standard protocol during bolus administration of intravenous contrast. CONTRAST:  153mL OMNIPAQUE IOHEXOL 300 MG/ML  SOLN COMPARISON:  09/17/2018 FINDINGS: CT CHEST FINDINGS Cardiovascular: The heart size appears within normal limits. No pericardial effusion identified. Aortic atherosclerosis. Lad coronary artery atherosclerotic calcifications. Mediastinum/Nodes: Normal appearance of the thyroid gland. The trachea appears patent and is midline. Normal appearance of the esophagus. No mediastinal or hilar adenopathy. Lungs/Pleura: No pleural effusion identified. Medial right upper lobe lung nodule is unchanged measuring 1.3 cm, image 82/3. 4 mm left lower lobe lung nodule is identified, image 90/3. Previously 5 mm. Adjacent nodule in the left lower lobe measures 2 mm, image 89/3. Previously 3 mm. No new pulmonary nodules. Musculoskeletal: No chest wall mass or suspicious bone lesions identified. CT ABDOMEN PELVIS FINDINGS Hepatobiliary: No suspicious liver lesion. Stones identified within the gallbladder which measure up to 5 mm. No gallbladder wall inflammation. No bile duct dilatation. Pancreas: Previously identified cystic lesion within uncinate process of pancreas measures 1.6 cm on today's exam, image 84/2. Previously this measured 1.7 cm. No main duct dilatation or inflammation. Spleen: Normal in size without focal abnormality. Adrenals/Urinary Tract: Normal appearance of the adrenal glands. Cyst within anterior cortex of inferior pole of right kidney measures 4.4 cm, image 84/2. Small left kidney cysts are also identified, image 75/2. No hydronephrosis identified bilaterally. Tumor involving the upper pole collecting system of the right kidney lesion measures approximately 1.9 cm, image 92/4. On the previous  exam this measured 2.4 cm. Within the lower pole collecting system of the right kidney measures approximately 1.2 cm, similar to previous exam, image 90/4. Delayed imaging was not carried out through the pelvis therefore limited evaluation of the ureters bilaterally. The previously noted hyperdense/enhancing nodule within the bladder near the right UVJ is not confidently identified on today's study. Left posterior bladder wall diverticula identified. Stomach/Bowel: Stomach is normal. Small bowel loops are unremarkable. No bowel wall thickening, inflammation or infection. The appendix is visualized and appears normal. Distal colonic diverticulosis without acute inflammation. Vascular/Lymphatic: Aortic atherosclerosis. No aneurysm. No abdominal adenopathy. Previous right pelvic sidewall lymph node measures 1.7 cm short axis, image 117/2. Unchanged. No progressive adenopathy identified. Reproductive: Prostate gland is unremarkable. Other: No free fluid or fluid collections identified Musculoskeletal: No acute or significant osseous findings. IMPRESSION: 1. Similar appearance of enhancing urothelial lesions within the right renal collecting system. Not significantly changed when compared with previous exam. 2. Right pelvic sidewall lymph node is unchanged. 3. Continued regression of small nodules within the left lower lobe. Unchanged 1.3 cm right upper lobe lung nodule. 4. Stable cystic lesion within uncinate process of the pancreas. Favor benign pseudocyst or side branch IPMN. Attention on follow-up imaging advised. 5.  Aortic Atherosclerosis (ICD10-I70.0). Electronically Signed   By: Kerby Moors M.D.   On: 12/14/2018 12:24     Assessment and plan- Patient is a 85 y.o. male with metastatic urothelial carcinoma with metastases to the lymph nodes here for on treatment assessment prior to next cycle of Keytruda  Patient's most recent CT scans showed stable disease on Keytruda indicating good treatment response so  far.  However patient has developed recurrent dermatitis which seems to be coming up with heat cycle.  He has been using topical Kenalog cream and is also required oral steroids once.  His rash is significantly worse today and is involving his entire body.  I will therefore be holding his Keytruda today.  I will give him  steroids for 10 days starting at 50 mg of prednisone and taper it by 10 mg every other day.  I have also asked him to try topical Benadryl for the itching associated with some of the lesions.  It is unlikely that I will be able to continue Keytruda with his ongoing dermatitis and may have to switch his treatment to padcev in the future.   Visit Diagnosis 1. Autoimmune dermatitis   2. 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 Randa Evens, MD, MPH [facility] [phone] 01/13/2019 8:32 AM

## 2019-01-24 ENCOUNTER — Telehealth: Payer: Self-pay | Admitting: *Deleted

## 2019-01-24 MED ORDER — METHYLPREDNISOLONE 4 MG PO TBPK: ORAL_TABLET | ORAL | 0 refills | Status: DC

## 2019-01-24 MED ORDER — PREDNISONE 10 MG PO TABS: ORAL_TABLET | ORAL | 0 refills | Status: DC

## 2019-01-24 MED ORDER — PREDNISONE 10 MG PO TABS: 10.0000 mg | ORAL_TABLET | ORAL | 0 refills | Status: DC

## 2019-01-24 NOTE — Telephone Encounter (Signed)
I faxed it at lunch time to his pharmacy

## 2019-01-24 NOTE — Telephone Encounter (Signed)
Can you please give him medrol dose pack starting at 50 mg and tapering by 10 mg every other day? We will make a follow up phone call on Thursday. Steroids should help

## 2019-01-24 NOTE — Telephone Encounter (Signed)
Patient called reporting that he has completed his medicine for his rash and that the rash is actually worse than it was and is bothering him more also. He is asking to be seen. Please advise

## 2019-01-24 NOTE — Telephone Encounter (Signed)
I called patient and advised of repeat odf Prednisone taper

## 2019-01-25 ENCOUNTER — Other Ambulatory Visit: Payer: Self-pay | Admitting: Oncology

## 2019-01-27 ENCOUNTER — Telehealth: Payer: Self-pay | Admitting: *Deleted

## 2019-01-27 NOTE — Telephone Encounter (Signed)
Called pt to see how the rash is. He states that it is much better but not gone. Still some itching but it did not start getting better until after his 2nd round of steroids.  He felt that it may have something to do with taking pneumonia shot and that day his arm was red and itching and then he got a rash there and shoulder to arm. Belly was hot and then rash. Then it went on back and both arm. He later got flu shot. He wanted to know if he should come to chemo next week and I spoke to Janese Banks and she said she wants the rash to be totally gone away and would like his appt for chemo to be the week after chemo. His appt has been r/s for 12/1 10:45 labs, then see md and get treatment. He is agreeable.

## 2019-02-01 ENCOUNTER — Other Ambulatory Visit: Payer: Self-pay | Admitting: Oncology

## 2019-02-01 ENCOUNTER — Other Ambulatory Visit: Payer: Medicare HMO

## 2019-02-01 ENCOUNTER — Ambulatory Visit: Payer: Medicare HMO | Admitting: Oncology

## 2019-02-01 ENCOUNTER — Ambulatory Visit: Payer: Medicare HMO

## 2019-02-01 DIAGNOSIS — C669 Malignant neoplasm of unspecified ureter: Secondary | ICD-10-CM

## 2019-02-01 NOTE — Progress Notes (Signed)
DISCONTINUE ON PATHWAY REGIMEN - Bladder     A cycle is 21 days:     Pembrolizumab   **Always confirm dose/schedule in your pharmacy ordering system**  REASON: Toxicities / Adverse Event PRIOR TREATMENT: [id number]: Pembrolizumab 200 mg q21 Days Until Progression,  Unacceptable Toxicity, or up to 24 Months TREATMENT RESPONSE: Partial Response (PR)  START ON PATHWAY REGIMEN - Bladder     A cycle is every 28 days:     Enfortumab vedotin-ejfv   **Always confirm dose/schedule in your pharmacy ordering system**  Patient Characteristics: Advanced/Metastatic Disease, Third Line and Beyond Therapeutic Status: Advanced/Metastatic Disease Line of Therapy: Third Line and Beyond  Intent of Therapy: Non-Curative / Palliative Intent, Discussed with Patient

## 2019-02-07 ENCOUNTER — Other Ambulatory Visit: Payer: Self-pay

## 2019-02-07 ENCOUNTER — Inpatient Hospital Stay (HOSPITAL_BASED_OUTPATIENT_CLINIC_OR_DEPARTMENT_OTHER): Payer: Medicare HMO | Admitting: Nurse Practitioner

## 2019-02-07 ENCOUNTER — Telehealth: Payer: Self-pay | Admitting: *Deleted

## 2019-02-07 ENCOUNTER — Encounter: Payer: Self-pay | Admitting: Nurse Practitioner

## 2019-02-07 VITALS — BP 136/83 | HR 81 | Temp 97.9°F | Resp 18 | Wt 200.0 lb

## 2019-02-07 DIAGNOSIS — Z9289 Personal history of other medical treatment: Secondary | ICD-10-CM | POA: Insufficient documentation

## 2019-02-07 DIAGNOSIS — R21 Rash and other nonspecific skin eruption: Secondary | ICD-10-CM

## 2019-02-07 DIAGNOSIS — C676 Malignant neoplasm of ureteric orifice: Secondary | ICD-10-CM | POA: Diagnosis not present

## 2019-02-07 MED ORDER — PREDNISONE 10 MG PO TABS: 50.0000 mg | ORAL_TABLET | Freq: Every day | ORAL | 0 refills | Status: DC

## 2019-02-07 MED ORDER — TRIAMCINOLONE ACETONIDE 0.5 % EX OINT: TOPICAL_OINTMENT | Freq: Two times a day (BID) | CUTANEOUS | 3 refills | Status: DC | PRN

## 2019-02-07 NOTE — Telephone Encounter (Signed)
Please schedule for [facility]

## 2019-02-07 NOTE — Telephone Encounter (Signed)
Anthony Gonzales/Anthony Gonzales- can one of you see him. He cant do video

## 2019-02-07 NOTE — Telephone Encounter (Signed)
Patient called reporting that his rash is back worse than it was and is asking what to do. Please advise

## 2019-02-07 NOTE — Telephone Encounter (Signed)
Patient coming in at 1:00 PM

## 2019-02-07 NOTE — Progress Notes (Signed)
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[place])  09/15/2017 Cancer Staging   Staging form: Renal Pelvis and Ureter, AJCC 8th Edition - Clinical stage from 09/15/2017: Stage IV (cT2, cN0, cM1) - Signed by Sindy Guadeloupe, MD on 09/17/2017   09/17/2017 Initial Diagnosis   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[place])   09/22/2017 - 06/07/2018 Chemotherapy   The patient had palonosetron (ALOXI) injection 0.25 mg, 0.25 mg, Intravenous,  Once, 9 of 10 cycles Administration: 0.25 mg (09/22/2017), 0.25 mg (10/06/2017), 0.25 mg (10/20/2017), 0.25 mg (11/03/2017), 0.25 mg (11/17/2017), 0.25 mg (12/01/2017), 0.25 mg (12/15/2017), 0.25 mg (12/29/2017), 0.25 mg (01/12/2018), 0.25 mg (01/26/2018), 0.25 mg (02/09/2018), 0.25 mg (02/23/2018), 0.25 mg (03/16/2018), 0.25 mg (04/13/2018), 0.25 mg (05/11/2018) pegfilgrastim-cbqv (UDENYCA) injection 6 mg, 6 mg, Subcutaneous, Once, 5 of 6 cycles Administration: 6 mg (01/13/2018), 6 mg (02/10/2018), 6 mg (03/17/2018), 6 mg (04/15/2018), 6 mg (05/12/2018), 6 mg (05/26/2018) CARBOplatin (PARAPLATIN) 160 mg in sodium chloride 0.9 % 250 mL chemo infusion, 160 mg (100 % of original dose 163.2 mg), Intravenous,  Once, 9 of 10 cycles Dose modification:   (original dose 163.2 mg, Cycle 1) Administration: 160 mg (09/22/2017), 170 mg (10/06/2017), 170 mg (10/20/2017), 170 mg (11/17/2017), 160 mg (12/01/2017), 170 mg (11/03/2017), 160 mg (12/15/2017), 160 mg (12/29/2017), 180 mg (01/12/2018), 180 mg (01/26/2018), 160 mg (02/09/2018), 160 mg (02/23/2018), 160 mg (03/16/2018), 160 mg (03/30/2018), 160 mg (04/13/2018), 180 mg (04/27/2018),  180 mg (05/11/2018), 160 mg (05/25/2018) gemcitabine (GEMZAR) 1,600 mg in sodium chloride 0.9 % 250 mL chemo infusion, 1,672 mg (100 % of original dose 800 mg/m2), Intravenous,  Once, 9 of 10 cycles Dose modification: 800 mg/m2 (original dose 800 mg/m2, Cycle 1, Reason: Patient Age) Administration: 1,600 mg (09/22/2017), 1,600 mg (10/06/2017), 1,600 mg (10/20/2017), 1,600 mg (11/03/2017), 1,600 mg (11/17/2017), 1,600 mg (12/01/2017), 1,600 mg (12/15/2017), 1,600 mg (12/29/2017), 1,600 mg (01/12/2018), 1,600 mg (01/26/2018), 1,600 mg (02/09/2018), 1,600 mg (02/23/2018), 1,600 mg (03/16/2018), 1,600 mg (03/30/2018), 1,600 mg (04/13/2018), 1,600 mg (04/27/2018), 1,600 mg (05/11/2018), 1,600 mg (05/25/2018)  for chemotherapy treatment.    06/08/2018 - 01/10/2019 Chemotherapy   The patient had pembrolizumab (KEYTRUDA) 200 mg in sodium chloride 0.9 % 50 mL chemo infusion, 200 mg, Intravenous, Once, 10 of 11 cycles Administration: 200 mg (06/08/2018), 200 mg (06/29/2018), 200 mg (07/20/2018), 200 mg (08/13/2018), 200 mg (09/03/2018), 200 mg (09/24/2018), 200 mg (10/19/2018), 200 mg (11/09/2018), 200 mg (11/30/2018), 200 mg (12/21/2018)  for chemotherapy treatment.    02/07/2019 -  Chemotherapy   The patient had palonosetron (ALOXI) injection 0.25 mg, 0.25 mg, Intravenous,  Once, 0 of 4 cycles enfortumab vedotin-ejfv (PADCEV) 89 mg in sodium chloride 0.9 % 50 mL (1.511 mg/mL) chemo infusion, 1 mg/kg = 89 mg (original dose ), Intravenous,  Once, 0 of 4 cycles Dose modification: 1 mg/kg (Cycle 1, Reason: Patient Age)  for chemotherapy treatment.      Interval history- Anthony Gonzales, 85 year old male with above history of urothelial carcinoma, recently on Keytruda, presents to symptom management clinic for immunotherapy induced rash.  He had similar symptoms and was seen by Dr. Janese Banks on 01/11/2019.  He started prednisone 50 mg daily  at that time and has since tapered off.  His last dose of steroids was last week.  He took prednisone 50  mg/day for 10 days followed by Medrol Dosepak.  He says that when he got down to approximately 30 mg he noticed the itching became worse.  He has rash on head back legs and chest.  Itching is most bothersome.  Describes it as raised bumps.  He uses Kenalog cream but is out of this.  No fever or chills.  No blisters or drainage from sores.  He says that overall he feels well and he is concerned that treatment will be held due to his rash.  Beryle Flock has been discontinued and plans to proceed with Padcev.  He says that he tolerated steroids well without significant side effects  ECOG FS:1 - Symptomatic but completely ambulatory  Review of systems- Review of Systems  Constitutional: Negative for chills, fever, malaise/fatigue and weight loss.  HENT: Negative for hearing loss, nosebleeds, sore throat and tinnitus.   Eyes: Negative for blurred vision and double vision.  Respiratory: Negative for cough, hemoptysis, shortness of breath and wheezing.   Cardiovascular: Negative for chest pain, palpitations and leg swelling.  Gastrointestinal: Negative for abdominal pain, blood in stool, constipation, diarrhea, melena, nausea and vomiting.  Genitourinary: Negative for dysuria and urgency.  Musculoskeletal: Negative for back pain, falls, joint pain and myalgias.  Skin: Positive for itching and rash.  Neurological: Negative for dizziness, tingling, sensory change, loss of consciousness, weakness and headaches.  Endo/Heme/Allergies: Negative for environmental allergies. Does not bruise/bleed easily.  Psychiatric/Behavioral: Negative for depression. The patient is not nervous/anxious and does not have insomnia.      Current treatment- s/p [place]. Planning to start Padcev  Allergies  Allergen Reactions  . Demerol [Meperidine] Nausea And Vomiting  . Lipitor [Atorvastatin] Swelling  . Sulfa Antibiotics Nausea And Vomiting and Rash    Past Medical History:  Diagnosis Date  . Arthritis   . Benign  fibroma of prostate 08/23/2013  . BPH (benign prostatic hyperplasia)   . Calculus of kidney 08/23/2013  . Glaucoma    no drops in 3 mo pressure good, pt denies glaucoma, eye pressure has been measuring alright.  . History of kidney stones   . HLD (hyperlipidemia)   . HOH (hard of hearing)    Left Hearing Aid  . HTN (hypertension) 12/26/2014  . Hypertension   . Hyponatremia 12/26/2014  . Migraines    history of migraines when he was younger.  [name] Restless leg syndrome   . Sinus drainage   . Skin cancer   . Urothelial cancer ([place])    chemo tx's.  [name] UTI (lower urinary tract infection) 12/26/2014  . Vertigo     Past Surgical History:  Procedure Laterality Date  . COLONOSCOPY    . CYSTOSCOPY W/ RETROGRADES Right 01/30/2015   Procedure: CYSTOSCOPY WITH RETROGRADE PYELOGRAM;  Surgeon: [name], MD;  Location: [facility];  Service: Urology;  Laterality: Right;  . CYSTOSCOPY W/ RETROGRADES Bilateral 02/26/2016   Procedure: CYSTOSCOPY WITH RETROGRADE PYELOGRAM;  Surgeon: [name], MD;  Location: [facility];  Service: Urology;  Laterality: Bilateral;  . CYSTOSCOPY W/ URETERAL STENT PLACEMENT Right 08/20/2015   Procedure: CYSTOSCOPY WITH RETROGRADE PYELOGRAM/POSSIBLE URETERAL STENT PLACEMENT/BLADDER BIOPSY;  Surgeon: [name], MD;  Location: [facility];  Service: Urology;  Laterality: Right;  . CYSTOSCOPY W/ URETERAL STENT PLACEMENT Right 09/12/2015   Procedure: CYSTOSCOPY WITH STENT REPLACEMENT;  Surgeon: [name], MD;  Location: [facility];  Service: Urology;  Laterality: Right;  . CYSTOSCOPY WITH BIOPSY Right 09/12/2015   Procedure: CYSTOSCOPY WITH BLADDER AND URETERAL BIOPSY;  Surgeon: [name], MD;  Location: [facility];  Service: Urology;  Laterality: Right;  . CYSTOSCOPY WITH STENT PLACEMENT Right 01/30/2015   Procedure: CYSTOSCOPY WITH STENT PLACEMENT;  Surgeon: [name], MD;  Location: [facility];  Service: Urology;  Laterality: Right;  . CYSTOSCOPY WITH STENT  PLACEMENT Right 12/28/2017   Procedure: [place] WITH STENT Exchange;  Surgeon: [name], MD;  Location: [facility];  Service: Urology;  Laterality: Right;  . CYSTOSCOPY/URETEROSCOPY/HOLMIUM LASER/STENT PLACEMENT Right 08/19/2017   Procedure: CYSTOSCOPY/URETEROSCOPY/HOLMIUM LASER/STENT PLACEMENT;  Surgeon: [name], MD;  Location: [facility];  Service: Urology;  Laterality: Right;  . EYE SURGERY Bilateral    Cataract Extraction with IOL  . goiter removal    . HOLMIUM LASER APPLICATION N/A 2/37/6283   Procedure:  HOLMIUM LASER APPLICATION;  Surgeon: [name], MD;  Location: [facility];  Service: Urology;  Laterality: N/A;  . PORTA CATH INSERTION N/A 09/23/2017   Procedure: PORTA CATH INSERTION;  Surgeon: Algernon Huxley, MD;  Location: [facility];  Service: Cardiovascular;  Laterality: N/A;  . SPERMATOCELECTOMY    . TONSILLECTOMY    . TRANSURETHRAL RESECTION OF BLADDER TUMOR N/A 02/26/2016   Procedure: TRANSURETHRAL RESECTION OF BLADDER TUMOR (TURBT);  Surgeon: [name], MD;  Location: [facility];  Service: Urology;  Laterality: N/A;  . TRANSURETHRAL RESECTION OF BLADDER TUMOR WITH MITOMYCIN-C N/A 09/12/2015   Procedure: TRANSURETHRAL RESECTION OF BLADDER TUMOR ;  Surgeon: [name], MD;  Location: [facility];  Service: Urology;  Laterality: N/A;  . TRANSURETHRAL RESECTION OF BLADDER TUMOR WITH MITOMYCIN-C N/A 03/24/2016   Procedure: TRANSURETHRAL RESECTION OF BLADDER TUMOR WITH MITOMYCIN-C  (SMALL);  Surgeon: [name], MD;  Location: [facility];  Service: Urology;  Laterality: N/A;  . URETERAL BIOPSY Right 08/19/2017   Procedure: Renal Mass BIOPSY;  Surgeon: [name], MD;  Location: [facility];  Service: Urology;  Laterality: Right;  . URETEROSCOPY Right 01/30/2015   Procedure: URETEROSCOPY/ WITH BIOPSY AND CYTOLOGY BRUSHING;  Surgeon: [name], MD;  Location: [facility];  Service: Urology;  Laterality: Right;  . URETEROSCOPY Right 08/20/2015   Procedure:  URETEROSCOPY;  Surgeon: [name], MD;  Location: [facility];  Service: Urology;  Laterality: Right;  . URETEROSCOPY Right 09/12/2015   Procedure: URETEROSCOPY;  Surgeon: [name], MD;  Location: [facility];  Service: Urology;  Laterality: Right;  . URETEROSCOPY Right 02/26/2016   Procedure: URETEROSCOPY;  Surgeon: [name], MD;  Location: [facility];  Service: Urology;  Laterality: Right;    Social History   Socioeconomic History  . Marital status: Married    Spouse name: Not on file  . Number of children: Not on file  . Years of education: Not on file  . Highest education level: Not on file  Occupational History  . Not on file  Social Needs  . Financial resource strain: Not on file  . Food insecurity    Worry: Not on file    Inability: Not on file  . Transportation needs    Medical: Not on file    Non-medical: Not on file  Tobacco Use  . Smoking status: Never Smoker  . Smokeless tobacco: Never Used  Substance and Sexual Activity  . Alcohol use: No    Alcohol/week: 0.0 standard drinks  . Drug use: No  . Sexual activity: Not on file  Lifestyle  . Physical activity  Days per week: Not on file    Minutes per session: Not on file  . Stress: Not on file  Relationships  . Social Herbalist on phone: Not on file    Gets together: Not on file    Attends religious service: Not on file    Active member of club or organization: Not on file    Attends meetings of clubs or organizations: Not on file    Relationship status: Not on file  . Intimate partner violence    Fear of current or ex partner: Not on file    Emotionally abused: Not on file    Physically abused: Not on file    Forced sexual activity: Not on file  Other Topics Concern  . Not on file  Social History Narrative  . Not on file    Family History  Problem Relation Age of Onset  . Hypertension Mother   . Hypertension Father   . Prostate cancer Brother   . Kidney disease Neg Hx   .  Kidney cancer Neg Hx   . Bladder Cancer Neg Hx      Current Outpatient Medications:  .  acetaminophen (TYLENOL) 500 MG tablet, Take 1,000 mg by mouth daily as needed for moderate pain. , Disp: , Rfl:  .  amLODipine (NORVASC) 5 MG tablet, Take 5 mg by mouth daily. , Disp: , Rfl:  .  benazepril-hydrochlorthiazide (LOTENSIN HCT) 20-12.5 MG tablet, Take 1 tablet by mouth daily. , Disp: , Rfl:  .  cetirizine (ZYRTEC) 10 MG tablet, Take 10 mg by mouth daily., Disp: , Rfl:  .  diazepam (VALIUM) 5 MG tablet, TAKE 1 TABLET (5 MG TOTAL) BY MOUTH EVERY 12 (TWELVE) HOURS AS NEEDED FOR ANXIETY OR SLEEP, Disp: , Rfl:  .  docusate sodium (COLACE) 100 MG capsule, Take 100 mg by mouth 2 (two) times daily. , Disp: , Rfl:  .  fenofibrate micronized (LOFIBRA) 134 MG capsule, Take 134 mg by mouth daily before breakfast. , Disp: , Rfl:  .  fluticasone (FLONASE) 50 MCG/ACT nasal spray, Place 2 sprays into both nostrils at bedtime., Disp: , Rfl:  .  hydrocortisone cream 1 %, Apply 1 application topically daily as needed for itching., Disp: , Rfl:  .  Multiple Vitamin (MULTIVITAMIN WITH MINERALS) TABS tablet, Take 1 tablet by mouth daily., Disp: , Rfl:  .  psyllium (METAMUCIL) 58.6 % packet, Take 1 packet by mouth daily. , Disp: , Rfl:  .  tamsulosin (FLOMAX) 0.4 MG CAPS capsule, Take 1 capsule (0.4 mg total) by mouth daily., Disp: 90 capsule, Rfl: 3 .  triamcinolone ointment (KENALOG) 0.5 %, APPLY TO AFFECTED AREA TWICE A DAY, Disp: 60 g, Rfl: 0 .  HYDROcodone-acetaminophen (NORCO/VICODIN) 5-325 MG tablet, Take 1 tablet by mouth every 6 (six) hours as needed for severe pain. (Patient not taking: Reported on 01/11/2019), Disp: 30 tablet, Rfl: 0 .  mirabegron ER (MYRBETRIQ) 50 MG TB24 tablet, Take 1 tablet (50 mg total) by mouth daily. (Patient not taking: Reported on 01/11/2019), Disp: 30 tablet, Rfl: 11 .  nystatin (MYCOSTATIN/NYSTOP) powder, APPLY TO AFFECTED AREA 4 TIMES A DAY, Disp: 15 g, Rfl: 0 .  Potassium 99 MG  TABS, Take 99 mg by mouth at bedtime., Disp: , Rfl:   Physical exam:  Vitals:   02/07/19 1307 02/07/19 1308  BP: 136/83   Pulse: 81   Resp: 18   Temp:  97.9 F (36.6 C)  TempSrc: Tympanic  Weight: 200 lb (90.7 kg)    Physical Exam Constitutional:      General: He is not in acute distress.    Comments: Unaccompanied. Wearing mask.   Skin:    Findings: Rash present. Rash is macular and papular.     Comments: Maculopapular rash covering greater than 30% BSA with associated pruritus.  Does not limit ADLs.  Neurological:     Mental Status: He is alert and oriented to person, place, and time.  Psychiatric:        Mood and Affect: Mood normal.        Behavior: Behavior normal.      CMP Latest Ref Rng & Units 01/11/2019  Glucose 70 - 99 mg/dL 145(H)  BUN 8 - 23 mg/dL 21  Creatinine 0.61 - 1.24 mg/dL 1.27(H)  Sodium 135 - 145 mmol/L 134(L)  Potassium 3.5 - 5.1 mmol/L 3.8  Chloride 98 - 111 mmol/L 99  CO2 22 - 32 mmol/L 27  Calcium 8.9 - 10.3 mg/dL 8.7(L)  Total Protein 6.5 - 8.1 g/dL 6.4(L)  Total Bilirubin 0.3 - 1.2 mg/dL 0.7  Alkaline Phos 38 - 126 U/L 46  AST 15 - 41 U/L 29  ALT 0 - 44 U/L 20   CBC Latest Ref Rng & Units 01/11/2019  WBC 4.0 - 10.5 K/uL 5.1  Hemoglobin 13.0 - 17.0 g/dL 13.2  Hematocrit 39.0 - 52.0 % 38.3(L)  Platelets 150 - 400 K/uL 213    No images are attached to the encounter.  No results found.  Assessment and plan- Patient is a 85 y.o. male diagnosed with urothelial carcinoma, most recently on Keytruda, who presents to symptom management clinic for rash.  Maculopapular rash- secondary to [facility]. Grade 2-3.  Covering greater than 30% BSA with associated pruritus but does not limit self-care.  Now status post 3-1/2 weeks of oral steroids with refractory symptoms.  Per NCCN guidelines, restart prednisone 0.5 mg/kg/day or 50 mg daily. We discussed that goal would be to control symptoms on the lowest possible dose of steroids. Plant o continue  steroid treatment till symptoms improve to grade 1 then begin decreasing dose. Advised patient that sometimes steroids may need to be tapered over 4 to 6 weeks.  Continue oral antihistamine and topical steroids (refill provided today). Continue topical emollient. Keytruda discontinued. Discussed symptoms with Dr. Janese Banks who recommends proceeding with dermatology consultation. Will send referral today. She also recommends holding planned treatment for tomorrow and postponing for one week. She will see patient at that time and consider initiating taper.   Disposition:  Follow up with Dr. Janese Banks in 1 week. Consider decreasing/tapering steroids at that time.    Visit Diagnosis 1. Maculopapular rash   2. History of immunotherapy     Patient expressed understanding and was in agreement with this plan. He also understands that He can call clinic at any time with any questions, concerns, or complaints.   Thank you for allowing me to participate in the care of this very pleasant patient.   Beckey Rutter, DNP, AGNP-[facility]  CC: Dr. Janese Banks

## 2019-02-08 ENCOUNTER — Inpatient Hospital Stay: Payer: Medicare HMO

## 2019-02-08 ENCOUNTER — Inpatient Hospital Stay: Payer: Medicare HMO | Admitting: Oncology

## 2019-02-15 ENCOUNTER — Encounter: Payer: Self-pay | Admitting: Oncology

## 2019-02-15 ENCOUNTER — Other Ambulatory Visit: Payer: Medicare HMO

## 2019-02-15 ENCOUNTER — Inpatient Hospital Stay: Payer: Medicare HMO | Attending: Oncology | Admitting: Oncology

## 2019-02-15 ENCOUNTER — Other Ambulatory Visit: Payer: Self-pay

## 2019-02-15 ENCOUNTER — Inpatient Hospital Stay: Payer: Medicare HMO

## 2019-02-15 VITALS — BP 138/74 | HR 88 | Temp 97.9°F | Resp 16 | Wt 199.5 lb

## 2019-02-15 DIAGNOSIS — K8020 Calculus of gallbladder without cholecystitis without obstruction: Secondary | ICD-10-CM | POA: Insufficient documentation

## 2019-02-15 DIAGNOSIS — Z8249 Family history of ischemic heart disease and other diseases of the circulatory system: Secondary | ICD-10-CM | POA: Insufficient documentation

## 2019-02-15 DIAGNOSIS — Z95828 Presence of other vascular implants and grafts: Secondary | ICD-10-CM

## 2019-02-15 DIAGNOSIS — I10 Essential (primary) hypertension: Secondary | ICD-10-CM | POA: Insufficient documentation

## 2019-02-15 DIAGNOSIS — C669 Malignant neoplasm of unspecified ureter: Secondary | ICD-10-CM

## 2019-02-15 DIAGNOSIS — Z882 Allergy status to sulfonamides status: Secondary | ICD-10-CM | POA: Diagnosis not present

## 2019-02-15 DIAGNOSIS — Z885 Allergy status to narcotic agent status: Secondary | ICD-10-CM | POA: Insufficient documentation

## 2019-02-15 DIAGNOSIS — Z87442 Personal history of urinary calculi: Secondary | ICD-10-CM | POA: Diagnosis not present

## 2019-02-15 DIAGNOSIS — Z85828 Personal history of other malignant neoplasm of skin: Secondary | ICD-10-CM | POA: Insufficient documentation

## 2019-02-15 DIAGNOSIS — N400 Enlarged prostate without lower urinary tract symptoms: Secondary | ICD-10-CM | POA: Diagnosis not present

## 2019-02-15 DIAGNOSIS — Z7189 Other specified counseling: Secondary | ICD-10-CM

## 2019-02-15 DIAGNOSIS — L308 Other specified dermatitis: Secondary | ICD-10-CM | POA: Diagnosis not present

## 2019-02-15 DIAGNOSIS — M1990 Unspecified osteoarthritis, unspecified site: Secondary | ICD-10-CM | POA: Insufficient documentation

## 2019-02-15 DIAGNOSIS — Z8042 Family history of malignant neoplasm of prostate: Secondary | ICD-10-CM | POA: Diagnosis not present

## 2019-02-15 DIAGNOSIS — C7801 Secondary malignant neoplasm of right lung: Secondary | ICD-10-CM | POA: Diagnosis not present

## 2019-02-15 DIAGNOSIS — C680 Malignant neoplasm of urethra: Secondary | ICD-10-CM | POA: Diagnosis present

## 2019-02-15 DIAGNOSIS — I2510 Atherosclerotic heart disease of native coronary artery without angina pectoris: Secondary | ICD-10-CM | POA: Insufficient documentation

## 2019-02-15 DIAGNOSIS — Z79899 Other long term (current) drug therapy: Secondary | ICD-10-CM | POA: Insufficient documentation

## 2019-02-15 DIAGNOSIS — Z5112 Encounter for antineoplastic immunotherapy: Secondary | ICD-10-CM | POA: Insufficient documentation

## 2019-02-15 DIAGNOSIS — I700 Atherosclerosis of aorta: Secondary | ICD-10-CM | POA: Diagnosis not present

## 2019-02-15 DIAGNOSIS — N1330 Unspecified hydronephrosis: Secondary | ICD-10-CM | POA: Diagnosis not present

## 2019-02-15 DIAGNOSIS — C779 Secondary and unspecified malignant neoplasm of lymph node, unspecified: Secondary | ICD-10-CM | POA: Insufficient documentation

## 2019-02-15 LAB — CBC WITH DIFFERENTIAL/PLATELET
Abs Immature Granulocytes: 0.08 10*3/uL — ABNORMAL HIGH (ref 0.00–0.07)
Basophils Absolute: 0 10*3/uL (ref 0.0–0.1)
Basophils Relative: 0 %
Eosinophils Absolute: 0.1 10*3/uL (ref 0.0–0.5)
Eosinophils Relative: 1 %
HCT: 41.7 % (ref 39.0–52.0)
Hemoglobin: 14 g/dL (ref 13.0–17.0)
Immature Granulocytes: 1 %
Lymphocytes Relative: 24 %
Lymphs Abs: 2.1 10*3/uL (ref 0.7–4.0)
MCH: 29.7 pg (ref 26.0–34.0)
MCHC: 33.6 g/dL (ref 30.0–36.0)
MCV: 88.5 fL (ref 80.0–100.0)
Monocytes Absolute: 0.9 10*3/uL (ref 0.1–1.0)
Monocytes Relative: 10 %
Neutro Abs: 5.5 10*3/uL (ref 1.7–7.7)
Neutrophils Relative %: 64 %
Platelets: 236 10*3/uL (ref 150–400)
RBC: 4.71 MIL/uL (ref 4.22–5.81)
RDW: 13.2 % (ref 11.5–15.5)
WBC: 8.7 10*3/uL (ref 4.0–10.5)
nRBC: 0 % (ref 0.0–0.2)

## 2019-02-15 LAB — COMPREHENSIVE METABOLIC PANEL
ALT: 23 U/L (ref 0–44)
AST: 23 U/L (ref 15–41)
Albumin: 3.5 g/dL (ref 3.5–5.0)
Alkaline Phosphatase: 46 U/L (ref 38–126)
Anion gap: 7 (ref 5–15)
BUN: 27 mg/dL — ABNORMAL HIGH (ref 8–23)
CO2: 28 mmol/L (ref 22–32)
Calcium: 8.7 mg/dL — ABNORMAL LOW (ref 8.9–10.3)
Chloride: 98 mmol/L (ref 98–111)
Creatinine, Ser: 1.29 mg/dL — ABNORMAL HIGH (ref 0.61–1.24)
GFR calc Af Amer: 58 mL/min — ABNORMAL LOW (ref >60)
GFR calc non Af Amer: 50 mL/min — ABNORMAL LOW (ref >60)
Glucose, Bld: 125 mg/dL — ABNORMAL HIGH (ref 70–99)
Potassium: 3.6 mmol/L (ref 3.5–5.1)
Sodium: 133 mmol/L — ABNORMAL LOW (ref 135–145)
Total Bilirubin: 0.9 mg/dL (ref 0.3–1.2)
Total Protein: 6.2 g/dL — ABNORMAL LOW (ref 6.5–8.1)

## 2019-02-15 MED ORDER — SODIUM CHLORIDE 0.9 % IV SOLN: 10.0000 mg | Freq: Once | INTRAVENOUS | Status: DC

## 2019-02-15 MED ORDER — PALONOSETRON HCL INJECTION 0.25 MG/5ML
0.2500 mg | Freq: Once | INTRAVENOUS | Status: AC
  Administered 2019-02-15: 11:00:00 0.25 mg via INTRAVENOUS
  Filled 2019-02-15: qty 5

## 2019-02-15 MED ORDER — DEXAMETHASONE SODIUM PHOSPHATE 10 MG/ML IJ SOLN
10.0000 mg | Freq: Once | INTRAMUSCULAR | Status: AC
  Administered 2019-02-15: 11:00:00 10 mg via INTRAVENOUS
  Filled 2019-02-15: qty 1

## 2019-02-15 MED ORDER — SODIUM CHLORIDE 0.9% FLUSH
10.0000 mL | Freq: Once | INTRAVENOUS | Status: AC
  Administered 2019-02-15: 10 mL via INTRAVENOUS
  Filled 2019-02-15: qty 10

## 2019-02-15 MED ORDER — SODIUM CHLORIDE 0.9 % IV SOLN
90.0000 mg | Freq: Once | INTRAVENOUS | Status: AC
  Administered 2019-02-15: 11:00:00 90 mg via INTRAVENOUS
  Filled 2019-02-15: qty 9

## 2019-02-15 MED ORDER — HEPARIN SOD (PORK) LOCK FLUSH 100 UNIT/ML IV SOLN
500.0000 [IU] | Freq: Once | INTRAVENOUS | Status: AC | PRN
  Administered 2019-02-15: 12:00:00 500 [IU]
  Filled 2019-02-15: qty 5

## 2019-02-15 MED ORDER — SODIUM CHLORIDE 0.9 % IV SOLN
Freq: Once | INTRAVENOUS | Status: AC
  Administered 2019-02-15: 11:00:00 via INTRAVENOUS
  Filled 2019-02-15: qty 250

## 2019-02-15 NOTE — Progress Notes (Signed)
Pt tolerated infusion well. No s/s of distress noted. Pt denies any complaints or concerns at this time. Pt stable at discharge.

## 2019-02-16 NOTE — Progress Notes (Signed)
Hematology/Oncology Consult note [facility]  Telephone:([phone] Fax:([phone]  Patient Care Team: Idelle Crouch, MD as PCP - General (Internal Medicine)   Name of the patient: Anthony Gonzales  [id number]  1931-12-25   Date of visit: 02/16/19  Diagnosis- Metastatic urothelial carcinoma with possible mets to the obturator node. Indeterminate hilar lymph nodes and LUL lung nodule  Chief complaint/ Reason for visit-on treatment assessment prior to cycle 1 of Padcev  Heme/Onc history: patient is a 85 year old male with past medical history significant for hypertension and long-standing history of superficial bladder cancer for which he sees [name]. He has undergone TURBT as well as ureteroscopy since 2017 along with mitomycin as well in the past. Most recently he underwent CT abdomen on 08/06/2017 which showed a soft tissue mass in the right renal pelvis concerning for upper tract urothelial neoplasm. Soft tissue fullness at the right ureterovesical junction with proximal right hydroureteronephrosis. Several millimeter attenuation lesion in the pancreatic head.  He underwent diagnostic ureteroscopy and was found to have 2 high-grade lesions within his right kidney. There was a nodular high-grade appearing lesion in the right anterior renal pelvis. There was also a second ureteral tumor fungating from the right ureteral orifice extending into the distal ureter also consistent with high-grade invasive urothelial carcinoma. Muscle invasion could not be assessed. He also underwent a CT chest which showed a rounded nodule in the right upper lobe measuring 14 mm concerning for metastases. 2 other 3 mm lesions were also noted in the left upper lobe likely benign  Plan initially was neoadjuvant chemotherapy followed by possible surgery but given the presence of lung lesion patient has been referred to oncology for the same.  PET/CT on 09/21/17  showed:IMPRESSION: 1. Hypermetabolic right obturator lymph node is most indicative of metastatic disease. 2. Mildly hypermetabolic left hilar lymph nodes are nonspecific. Continued attention on follow-up exams is warranted. 3. Right upper lobe pulmonary nodule shows metabolism after just above blood pool and is therefore indeterminate. Continued attention on follow-up exams is warranted. 4. Aortic atherosclerosis (ICD10-170.0). Coronary artery calcification. 5. Cholelithiasis  Carboplatin/ gemzar1 week on and one-week off as patient could not tolerate 2-week on and one week off regimen. Cycle 1 started on 09/22/2017 Disease progression in March 2020. Switched to second [employer]  Patient had disease controlled with Keytruda.  However he was noted to have worsening dermatitis with constant flareups requiring steroids.  Beryle Flock is therefore being kept on hold and patient will be switched to third line Padcev  Interval history-patient is currently on a steroid taper for his skin rash which has improved significantly.  He does not report any significant burning or itching at this time.  ECOG PS- 1 Pain scale- 0 Opioid associated constipation- no  Review of systems- Review of Systems  Constitutional: Negative for chills, fever, malaise/fatigue and weight loss.  HENT: Negative for congestion, ear discharge and nosebleeds.   Eyes: Negative for blurred vision.  Respiratory: Negative for cough, hemoptysis, sputum production, shortness of breath and wheezing.   Cardiovascular: Negative for chest pain, palpitations, orthopnea and claudication.  Gastrointestinal: Negative for abdominal pain, blood in stool, constipation, diarrhea, heartburn, melena, nausea and vomiting.  Genitourinary: Negative for dysuria, flank pain, frequency, hematuria and urgency.  Musculoskeletal: Negative for back pain, joint pain and myalgias.  Skin: Positive for rash.  Neurological: Negative for dizziness,  tingling, focal weakness, seizures, weakness and headaches.  Endo/Heme/Allergies: Does not bruise/bleed easily.  Psychiatric/Behavioral: Negative for depression  and suicidal ideas. The patient does not have insomnia.       Allergies  Allergen Reactions  . Demerol [Meperidine] Nausea And Vomiting  . Lipitor [Atorvastatin] Swelling  . Sulfa Antibiotics Nausea And Vomiting and Rash     Past Medical History:  Diagnosis Date  . Arthritis   . Benign fibroma of prostate 08/23/2013  . BPH (benign prostatic hyperplasia)   . Calculus of kidney 08/23/2013  . Glaucoma    no drops in 3 mo pressure good, pt denies glaucoma, eye pressure has been measuring alright.  . History of kidney stones   . HLD (hyperlipidemia)   . HOH (hard of hearing)    Left Hearing Aid  . HTN (hypertension) 12/26/2014  . Hypertension   . Hyponatremia 12/26/2014  . Migraines    history of migraines when he was younger.  [name] Restless leg syndrome   . Sinus drainage   . Skin cancer   . Urothelial cancer ([place])    chemo tx's.  [name] UTI (lower urinary tract infection) 12/26/2014  . Vertigo      Past Surgical History:  Procedure Laterality Date  . COLONOSCOPY    . CYSTOSCOPY W/ RETROGRADES Right 01/30/2015   Procedure: CYSTOSCOPY WITH RETROGRADE PYELOGRAM;  Surgeon: [name], MD;  Location: [facility];  Service: Urology;  Laterality: Right;  . CYSTOSCOPY W/ RETROGRADES Bilateral 02/26/2016   Procedure: CYSTOSCOPY WITH RETROGRADE PYELOGRAM;  Surgeon: [name], MD;  Location: [facility];  Service: Urology;  Laterality: Bilateral;  . CYSTOSCOPY W/ URETERAL STENT PLACEMENT Right 08/20/2015   Procedure: CYSTOSCOPY WITH RETROGRADE PYELOGRAM/POSSIBLE URETERAL STENT PLACEMENT/BLADDER BIOPSY;  Surgeon: [name], MD;  Location: [facility];  Service: Urology;  Laterality: Right;  . CYSTOSCOPY W/ URETERAL STENT PLACEMENT Right 09/12/2015   Procedure: CYSTOSCOPY WITH STENT REPLACEMENT;  Surgeon: [name], MD;   Location: [facility];  Service: Urology;  Laterality: Right;  . CYSTOSCOPY WITH BIOPSY Right 09/12/2015   Procedure: CYSTOSCOPY WITH BLADDER AND URETERAL BIOPSY;  Surgeon: [name], MD;  Location: [facility];  Service: Urology;  Laterality: Right;  . CYSTOSCOPY WITH STENT PLACEMENT Right 01/30/2015   Procedure: CYSTOSCOPY WITH STENT PLACEMENT;  Surgeon: [name], MD;  Location: [facility];  Service: Urology;  Laterality: Right;  . CYSTOSCOPY WITH STENT PLACEMENT Right 12/28/2017   Procedure: [place] WITH STENT Exchange;  Surgeon: [name], MD;  Location: [facility];  Service: Urology;  Laterality: Right;  . CYSTOSCOPY/URETEROSCOPY/HOLMIUM LASER/STENT PLACEMENT Right 08/19/2017   Procedure: CYSTOSCOPY/URETEROSCOPY/HOLMIUM LASER/STENT PLACEMENT;  Surgeon: [name], MD;  Location: [facility];  Service: Urology;  Laterality: Right;  . EYE SURGERY Bilateral    Cataract Extraction with IOL  . goiter removal    . HOLMIUM LASER APPLICATION N/A 3/81/8299   Procedure:  HOLMIUM LASER APPLICATION;  Surgeon: [name], MD;  Location: [facility];  Service: Urology;  Laterality: N/A;  . PORTA CATH INSERTION N/A 09/23/2017   Procedure: PORTA CATH INSERTION;  Surgeon: Algernon Huxley, MD;  Location: [facility];  Service: Cardiovascular;  Laterality: N/A;  . SPERMATOCELECTOMY    . TONSILLECTOMY    . TRANSURETHRAL RESECTION OF BLADDER TUMOR N/A 02/26/2016   Procedure: TRANSURETHRAL RESECTION OF BLADDER TUMOR (TURBT);  Surgeon: [name], MD;  Location: [facility];  Service: Urology;  Laterality: N/A;  . TRANSURETHRAL RESECTION OF BLADDER TUMOR WITH MITOMYCIN-C N/A 09/12/2015   Procedure: TRANSURETHRAL RESECTION OF BLADDER TUMOR ;  Surgeon: [name], MD;  Location: [facility];  Service: Urology;  Laterality: N/A;  .  TRANSURETHRAL RESECTION OF BLADDER TUMOR WITH MITOMYCIN-C N/A 03/24/2016   Procedure: TRANSURETHRAL RESECTION OF BLADDER TUMOR WITH MITOMYCIN-C  (SMALL);  Surgeon: [name], MD;  Location: [facility];  Service: Urology;  Laterality: N/A;  . URETERAL BIOPSY Right 08/19/2017   Procedure: Renal Mass BIOPSY;  Surgeon: [name], MD;  Location: [facility];  Service: Urology;  Laterality: Right;  . URETEROSCOPY Right 01/30/2015   Procedure: URETEROSCOPY/ WITH BIOPSY AND CYTOLOGY BRUSHING;  Surgeon: [name], MD;  Location: [facility];  Service: Urology;  Laterality: Right;  . URETEROSCOPY Right 08/20/2015   Procedure: URETEROSCOPY;  Surgeon: [name], MD;  Location: [facility];  Service: Urology;  Laterality: Right;  . URETEROSCOPY Right 09/12/2015   Procedure: URETEROSCOPY;  Surgeon: [name], MD;  Location: [facility];  Service: Urology;  Laterality: Right;  . URETEROSCOPY Right 02/26/2016   Procedure: URETEROSCOPY;  Surgeon: [name], MD;  Location: [facility];  Service: Urology;  Laterality: Right;    Social History   Socioeconomic History  . Marital status: Married    Spouse name: Not on file  . Number of children: Not on file  . Years of education: Not on file  . Highest education level: Not on file  Occupational History  . Not on file  Social Needs  . Financial resource strain: Not on file  . Food insecurity    Worry: Not on file    Inability: Not on file  . Transportation needs    Medical: Not on file    Non-medical: Not on file  Tobacco Use  . Smoking status: Never Smoker  . Smokeless tobacco: Never Used  Substance and Sexual Activity  . Alcohol use: No    Alcohol/week: 0.0 standard drinks  . Drug use: No  . Sexual activity: Not on file  Lifestyle  . Physical activity    Days per week: Not on file    Minutes per session: Not on file  . Stress: Not on file  Relationships  . Social Herbalist on phone: Not on file    Gets together: Not on file    Attends religious service: Not on file    Active member of club or organization: Not on file    Attends meetings of clubs or organizations: Not on file     Relationship status: Not on file  . Intimate partner violence    Fear of current or ex partner: Not on file    Emotionally abused: Not on file    Physically abused: Not on file    Forced sexual activity: Not on file  Other Topics Concern  . Not on file  Social History Narrative  . Not on file    Family History  Problem Relation Age of Onset  . Hypertension Mother   . Hypertension Father   . Prostate cancer Brother   . Kidney disease Neg Hx   . Kidney cancer Neg Hx   . Bladder Cancer Neg Hx      Current Outpatient Medications:  .  acetaminophen (TYLENOL) 500 MG tablet, Take 1,000 mg by mouth daily as needed for moderate pain. , Disp: , Rfl:  .  amLODipine (NORVASC) 5 MG tablet, Take 5 mg by mouth daily. , Disp: , Rfl:  .  benazepril-hydrochlorthiazide (LOTENSIN HCT) 20-12.5 MG tablet, Take 1 tablet by mouth daily. , Disp: , Rfl:  .  cetirizine (ZYRTEC) 10 MG tablet, Take 10 mg by mouth daily., Disp: , Rfl:  .  diazepam (VALIUM) 5 MG tablet, TAKE 1 TABLET (5 MG TOTAL) BY MOUTH EVERY 12 (TWELVE) HOURS AS NEEDED FOR ANXIETY OR SLEEP, Disp: , Rfl:  .  docusate sodium (COLACE) 100 MG capsule, Take 100 mg by mouth 2 (two) times daily. , Disp: , Rfl:  .  fenofibrate micronized (LOFIBRA) 134 MG capsule, Take 134 mg by mouth daily before breakfast. , Disp: , Rfl:  .  fluticasone (FLONASE) 50 MCG/ACT nasal spray, Place 2 sprays into both nostrils at bedtime., Disp: , Rfl:  .  HYDROcodone-acetaminophen (NORCO/VICODIN) 5-325 MG tablet, Take 1 tablet by mouth every 6 (six) hours as needed for severe pain., Disp: 30 tablet, Rfl: 0 .  mirabegron ER (MYRBETRIQ) 50 MG TB24 tablet, Take 1 tablet (50 mg total) by mouth daily., Disp: 30 tablet, Rfl: 11 .  Multiple Vitamin (MULTIVITAMIN WITH MINERALS) TABS tablet, Take 1 tablet by mouth daily., Disp: , Rfl:  .  nystatin (MYCOSTATIN/NYSTOP) powder, APPLY TO AFFECTED AREA 4 TIMES A DAY, Disp: 15 g, Rfl: 0 .  Potassium 99 MG TABS, Take 99 mg by mouth at  bedtime., Disp: , Rfl:  .  predniSONE (DELTASONE) 10 MG tablet, Take 5 tablets (50 mg total) by mouth daily with breakfast., Disp: 50 tablet, Rfl: 0 .  psyllium (METAMUCIL) 58.6 % packet, Take 1 packet by mouth daily. , Disp: , Rfl:  .  tamsulosin (FLOMAX) 0.4 MG CAPS capsule, Take 1 capsule (0.4 mg total) by mouth daily., Disp: 90 capsule, Rfl: 3 .  triamcinolone ointment (KENALOG) 0.5 %, Apply topically 2 (two) times daily as needed. For itching & rash, Disp: 60 g, Rfl: 3  Physical exam:  Vitals:   02/15/19 0934  BP: 138/74  Pulse: 88  Resp: 16  Temp: 97.9 F (36.6 C)  TempSrc: Tympanic  SpO2: 97%  Weight: 199 lb 8 oz (90.5 kg)   Physical Exam HENT:     Head: Normocephalic and atraumatic.  Eyes:     Pupils: Pupils are equal, round, and reactive to light.  Cardiovascular:     Rate and Rhythm: Normal rate and regular rhythm.     Heart sounds: Normal heart sounds.  Pulmonary:     Effort: Pulmonary effort is normal.     Breath sounds: Normal breath sounds.  Abdominal:     General: Bowel sounds are normal.     Palpations: Abdomen is soft.  Musculoskeletal:     Cervical back: Normal range of motion.  Skin:    General: Skin is warm and dry.     Comments: Scattered areas of maculopapular rash appear more prominent over the trunk as compared to the back.  It appears better than before  Neurological:     Mental Status: He is alert and oriented to person, place, and time.      CMP Latest Ref Rng & Units 02/15/2019  Glucose 70 - 99 mg/dL 125(H)  BUN 8 - 23 mg/dL 27(H)  Creatinine 0.61 - 1.24 mg/dL 1.29(H)  Sodium 135 - 145 mmol/L 133(L)  Potassium 3.5 - 5.1 mmol/L 3.6  Chloride 98 - 111 mmol/L 98  CO2 22 - 32 mmol/L 28  Calcium 8.9 - 10.3 mg/dL 8.7(L)  Total Protein 6.5 - 8.1 g/dL 6.2(L)  Total Bilirubin 0.3 - 1.2 mg/dL 0.9  Alkaline Phos 38 - 126 U/L 46  AST 15 - 41 U/L 23  ALT 0 - 44 U/L 23   CBC Latest Ref Rng & Units 02/15/2019  WBC 4.0 - 10.5 K/uL 8.7  Hemoglobin  13.0 - 17.0 g/dL 14.0  Hematocrit 39.0 - 52.0 % 41.7  Platelets 150 - 400 K/uL 236    Assessment and plan- Patient is a 85 y.o. male with metastatic urothelial carcinoma with metastases to the lymph nodes here for on treatment assessment prior to cycle 1 day 1 of Padcev  Patient developed grade 2 dermatitis with Keytruda and has had 2-3 flareups so far.  He is currently on a tapering dose of steroids.  He has not received Keytruda since October 2020.  I will not to rechallenge him with Keytruda at this time  He did have response to [facility] in the past but unfortunately cannot continue it due to autoimmune dermatitis.  I will switch him to Padcev at this time.  He has undergone a GFR mutation testing in the past and was not found to have one.  Discussed risks and benefits of Padcev including all but not limited to fatigue, nausea, vomiting, diarrhea, low blood counts, risk of dry eyes, peripheral neuropathy.  Treatment will be given with a palliative intent.  Patient understands and agrees to proceed as planned.  He will receive cycle 1 day 1 of Padcev at 1 mg per metered squared today.  I will see him back in 1 week's time for cycle 1 day 8.  Plan is to give him for 3 weeks on 1 week off until progression or toxicity.  I will plan to give him 2-3 cycles of Padcev before repeating his scans   Visit Diagnosis 1. Autoimmune dermatitis   2. 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 Goals of care, counseling/discussion      Dr. Randa Evens, MD, MPH [facility] at [facility] [phone] 02/16/2019 12:15 PM

## 2019-02-22 ENCOUNTER — Other Ambulatory Visit: Payer: Self-pay

## 2019-02-22 ENCOUNTER — Inpatient Hospital Stay: Payer: Medicare HMO

## 2019-02-22 ENCOUNTER — Ambulatory Visit: Payer: Medicare HMO

## 2019-02-22 ENCOUNTER — Other Ambulatory Visit: Payer: Medicare HMO

## 2019-02-22 ENCOUNTER — Ambulatory Visit: Payer: Medicare HMO | Admitting: Oncology

## 2019-02-22 ENCOUNTER — Inpatient Hospital Stay (HOSPITAL_BASED_OUTPATIENT_CLINIC_OR_DEPARTMENT_OTHER): Payer: Medicare HMO | Admitting: Oncology

## 2019-02-22 ENCOUNTER — Encounter: Payer: Self-pay | Admitting: Oncology

## 2019-02-22 VITALS — BP 138/92 | HR 92 | Temp 97.4°F | Wt 198.8 lb

## 2019-02-22 VITALS — Resp 18

## 2019-02-22 DIAGNOSIS — C669 Malignant neoplasm of unspecified ureter: Secondary | ICD-10-CM | POA: Diagnosis not present

## 2019-02-22 DIAGNOSIS — Z5111 Encounter for antineoplastic chemotherapy: Secondary | ICD-10-CM

## 2019-02-22 DIAGNOSIS — Z5112 Encounter for antineoplastic immunotherapy: Secondary | ICD-10-CM | POA: Diagnosis not present

## 2019-02-22 DIAGNOSIS — N179 Acute kidney failure, unspecified: Secondary | ICD-10-CM | POA: Diagnosis not present

## 2019-02-22 DIAGNOSIS — L308 Other specified dermatitis: Secondary | ICD-10-CM | POA: Diagnosis not present

## 2019-02-22 DIAGNOSIS — Z95828 Presence of other vascular implants and grafts: Secondary | ICD-10-CM

## 2019-02-22 LAB — CBC WITH DIFFERENTIAL/PLATELET
Abs Immature Granulocytes: 0.07 10*3/uL (ref 0.00–0.07)
Basophils Absolute: 0.1 10*3/uL (ref 0.0–0.1)
Basophils Relative: 1 %
Eosinophils Absolute: 0.1 10*3/uL (ref 0.0–0.5)
Eosinophils Relative: 1 %
HCT: 43.1 % (ref 39.0–52.0)
Hemoglobin: 14.5 g/dL (ref 13.0–17.0)
Immature Granulocytes: 1 %
Lymphocytes Relative: 24 %
Lymphs Abs: 2.4 10*3/uL (ref 0.7–4.0)
MCH: 29.9 pg (ref 26.0–34.0)
MCHC: 33.6 g/dL (ref 30.0–36.0)
MCV: 88.9 fL (ref 80.0–100.0)
Monocytes Absolute: 1 10*3/uL (ref 0.1–1.0)
Monocytes Relative: 10 %
Neutro Abs: 6.4 10*3/uL (ref 1.7–7.7)
Neutrophils Relative %: 63 %
Platelets: 180 10*3/uL (ref 150–400)
RBC: 4.85 MIL/uL (ref 4.22–5.81)
RDW: 13.4 % (ref 11.5–15.5)
WBC: 10.1 10*3/uL (ref 4.0–10.5)
nRBC: 0 % (ref 0.0–0.2)

## 2019-02-22 LAB — COMPREHENSIVE METABOLIC PANEL
ALT: 26 U/L (ref 0–44)
AST: 27 U/L (ref 15–41)
Albumin: 3.7 g/dL (ref 3.5–5.0)
Alkaline Phosphatase: 42 U/L (ref 38–126)
Anion gap: 9 (ref 5–15)
BUN: 24 mg/dL — ABNORMAL HIGH (ref 8–23)
CO2: 28 mmol/L (ref 22–32)
Calcium: 8.9 mg/dL (ref 8.9–10.3)
Chloride: 96 mmol/L — ABNORMAL LOW (ref 98–111)
Creatinine, Ser: 1.31 mg/dL — ABNORMAL HIGH (ref 0.61–1.24)
GFR calc Af Amer: 57 mL/min — ABNORMAL LOW (ref >60)
GFR calc non Af Amer: 49 mL/min — ABNORMAL LOW (ref >60)
Glucose, Bld: 119 mg/dL — ABNORMAL HIGH (ref 70–99)
Potassium: 3.6 mmol/L (ref 3.5–5.1)
Sodium: 133 mmol/L — ABNORMAL LOW (ref 135–145)
Total Bilirubin: 0.9 mg/dL (ref 0.3–1.2)
Total Protein: 6.1 g/dL — ABNORMAL LOW (ref 6.5–8.1)

## 2019-02-22 MED ORDER — SODIUM CHLORIDE 0.9 % IV SOLN
90.0000 mg | Freq: Once | INTRAVENOUS | Status: AC
  Administered 2019-02-22: 90 mg via INTRAVENOUS
  Filled 2019-02-22: qty 9

## 2019-02-22 MED ORDER — SODIUM CHLORIDE 0.9% FLUSH
10.0000 mL | Freq: Once | INTRAVENOUS | Status: AC
  Administered 2019-02-22: 11:00:00 10 mL via INTRAVENOUS
  Filled 2019-02-22: qty 10

## 2019-02-22 MED ORDER — HEPARIN SOD (PORK) LOCK FLUSH 100 UNIT/ML IV SOLN
500.0000 [IU] | Freq: Once | INTRAVENOUS | Status: AC | PRN
  Administered 2019-02-22: 500 [IU]
  Filled 2019-02-22: qty 5

## 2019-02-22 MED ORDER — PALONOSETRON HCL INJECTION 0.25 MG/5ML
0.2500 mg | Freq: Once | INTRAVENOUS | Status: AC
  Administered 2019-02-22: 0.25 mg via INTRAVENOUS
  Filled 2019-02-22: qty 5

## 2019-02-22 MED ORDER — SODIUM CHLORIDE 0.9 % IV SOLN
Freq: Once | INTRAVENOUS | Status: AC
  Filled 2019-02-22: qty 250

## 2019-02-22 MED ORDER — SODIUM CHLORIDE 0.9 % IV SOLN
10.0000 mg | Freq: Once | INTRAVENOUS | Status: AC
  Administered 2019-02-22: 12:00:00 10 mg via INTRAVENOUS
  Filled 2019-02-22: qty 10

## 2019-02-22 NOTE — Progress Notes (Signed)
Hematology/Oncology Consult note [facility]  [phone] [phone]  Patient Care Team: Idelle Crouch, MD as PCP - General (Internal Medicine)   Name of the patient: Anthony Gonzales  [id number]  [birth year]   Date of visit: 02/22/19  Diagnosis- Metastatic urothelial carcinoma with possible mets to the obturator node. Indeterminate hilar lymph nodes and LUL lung nodule  Chief complaint/ Reason for visit-on treatment assessment prior to cycle 1 day 8 of padcev  Heme/Onc history: patient is a 85 year old male with past medical history significant for hypertension and long-standing history of superficial bladder cancer for which he sees [name]. He has undergone TURBT as well as ureteroscopy since 2017 along with mitomycin as well in the past. Most recently he underwent CT abdomen on 08/06/2017 which showed a soft tissue mass in the right renal pelvis concerning for upper tract urothelial neoplasm. Soft tissue fullness at the right ureterovesical junction with proximal right hydroureteronephrosis. Several millimeter attenuation lesion in the pancreatic head.  He underwent diagnostic ureteroscopy and was found to have 2 high-grade lesions within his right kidney. There was a nodular high-grade appearing lesion in the right anterior renal pelvis. There was also a second ureteral tumor fungating from the right ureteral orifice extending into the distal ureter also consistent with high-grade invasive urothelial carcinoma. Muscle invasion could not be assessed. He also underwent a CT chest which showed a rounded nodule in the right upper lobe measuring 14 mm concerning for metastases. 2 other 3 mm lesions were also noted in the left upper lobe likely benign  Plan initially was neoadjuvant chemotherapy followed by possible surgery but given the presence of lung lesion patient has been referred to oncology for the same.  PET/CT on  09/21/17 showed:IMPRESSION: 1. Hypermetabolic right obturator lymph node is most indicative of metastatic disease. 2. Mildly hypermetabolic left hilar lymph nodes are nonspecific. Continued attention on follow-up exams is warranted. 3. Right upper lobe pulmonary nodule shows metabolism after just above blood pool and is therefore indeterminate. Continued attention on follow-up exams is warranted. 4. Aortic atherosclerosis (ICD10-170.0). Coronary artery calcification. 5. Cholelithiasis  Carboplatin/ gemzar1 week on and one-week off as patient could not tolerate 2-week on and one week off regimen. Cycle 1 started on 09/22/2017 Disease progression in March 2020. Switched to second [employer]  Patient had disease controlled with Keytruda.  However he was noted to have worsening dermatitis with constant flareups requiring steroids.  Beryle Flock is therefore being kept on hold and patient will be switched to third line Padcev  Interval history-skin rash is improving.  He reports some headache and sinus congestion symptoms.  Denies any fever.  Reports fatigue  ECOG PS- 1 Pain scale- 0   Review of systems- Review of Systems  Constitutional: Positive for malaise/fatigue. Negative for chills, fever and weight loss.  HENT: Positive for congestion. Negative for ear discharge and nosebleeds.   Eyes: Negative for blurred vision.  Respiratory: Negative for cough, hemoptysis, sputum production, shortness of breath and wheezing.   Cardiovascular: Negative for chest pain, palpitations, orthopnea and claudication.  Gastrointestinal: Negative for abdominal pain, blood in stool, constipation, diarrhea, heartburn, melena, nausea and vomiting.  Genitourinary: Negative for dysuria, flank pain, frequency, hematuria and urgency.  Musculoskeletal: Negative for back pain, joint pain and myalgias.  Skin: Negative for rash.  Neurological: Negative for dizziness, tingling, focal weakness, seizures, weakness  and headaches.  Endo/Heme/Allergies: Does not bruise/bleed easily.  Psychiatric/Behavioral: Negative for depression and suicidal ideas. The patient  does not have insomnia.       Allergies  Allergen Reactions  . Demerol [Meperidine] Nausea And Vomiting  . Lipitor [Atorvastatin] Swelling  . Sulfa Antibiotics Nausea And Vomiting and Rash     Past Medical History:  Diagnosis Date  . Arthritis   . Benign fibroma of prostate 08/23/2013  . BPH (benign prostatic hyperplasia)   . Calculus of kidney 08/23/2013  . Glaucoma    no drops in 3 mo pressure good, pt denies glaucoma, eye pressure has been measuring alright.  . History of kidney stones   . HLD (hyperlipidemia)   . HOH (hard of hearing)    Left Hearing Aid  . HTN (hypertension) 12/26/2014  . Hypertension   . Hyponatremia 12/26/2014  . Migraines    history of migraines when he was younger.  [name] Restless leg syndrome   . Sinus drainage   . Skin cancer   . Urothelial cancer ([place])    chemo tx's.  [name] UTI (lower urinary tract infection) 12/26/2014  . Vertigo      Past Surgical History:  Procedure Laterality Date  . COLONOSCOPY    . CYSTOSCOPY W/ RETROGRADES Right 01/30/2015   Procedure: CYSTOSCOPY WITH RETROGRADE PYELOGRAM;  Surgeon: [name], MD;  Location: [facility];  Service: Urology;  Laterality: Right;  . CYSTOSCOPY W/ RETROGRADES Bilateral 02/26/2016   Procedure: CYSTOSCOPY WITH RETROGRADE PYELOGRAM;  Surgeon: [name], MD;  Location: [facility];  Service: Urology;  Laterality: Bilateral;  . CYSTOSCOPY W/ URETERAL STENT PLACEMENT Right 08/20/2015   Procedure: CYSTOSCOPY WITH RETROGRADE PYELOGRAM/POSSIBLE URETERAL STENT PLACEMENT/BLADDER BIOPSY;  Surgeon: [name], MD;  Location: [facility];  Service: Urology;  Laterality: Right;  . CYSTOSCOPY W/ URETERAL STENT PLACEMENT Right 09/12/2015   Procedure: CYSTOSCOPY WITH STENT REPLACEMENT;  Surgeon: [name], MD;  Location: [facility];  Service: Urology;   Laterality: Right;  . CYSTOSCOPY WITH BIOPSY Right 09/12/2015   Procedure: CYSTOSCOPY WITH BLADDER AND URETERAL BIOPSY;  Surgeon: [name], MD;  Location: [facility];  Service: Urology;  Laterality: Right;  . CYSTOSCOPY WITH STENT PLACEMENT Right 01/30/2015   Procedure: CYSTOSCOPY WITH STENT PLACEMENT;  Surgeon: [name], MD;  Location: [facility];  Service: Urology;  Laterality: Right;  . CYSTOSCOPY WITH STENT PLACEMENT Right 12/28/2017   Procedure: [place] WITH STENT Exchange;  Surgeon: [name], MD;  Location: [facility];  Service: Urology;  Laterality: Right;  . CYSTOSCOPY/URETEROSCOPY/HOLMIUM LASER/STENT PLACEMENT Right 08/19/2017   Procedure: CYSTOSCOPY/URETEROSCOPY/HOLMIUM LASER/STENT PLACEMENT;  Surgeon: [name], MD;  Location: [facility];  Service: Urology;  Laterality: Right;  . EYE SURGERY Bilateral    Cataract Extraction with IOL  . goiter removal    . HOLMIUM LASER APPLICATION N/A 11/22/7827   Procedure:  HOLMIUM LASER APPLICATION;  Surgeon: [name], MD;  Location: [facility];  Service: Urology;  Laterality: N/A;  . PORTA CATH INSERTION N/A 09/23/2017   Procedure: PORTA CATH INSERTION;  Surgeon: Algernon Huxley, MD;  Location: [place] [facility];  Service: Cardiovascular;  Laterality: N/A;  . SPERMATOCELECTOMY    . TONSILLECTOMY    . TRANSURETHRAL RESECTION OF BLADDER TUMOR N/A 02/26/2016   Procedure: TRANSURETHRAL RESECTION OF BLADDER TUMOR (TURBT);  Surgeon: [name], MD;  Location: [facility];  Service: Urology;  Laterality: N/A;  . TRANSURETHRAL RESECTION OF BLADDER TUMOR WITH MITOMYCIN-C N/A 09/12/2015   Procedure: TRANSURETHRAL RESECTION OF BLADDER TUMOR ;  Surgeon: [name], MD;  Location: [facility];  Service: Urology;  Laterality: N/A;  . TRANSURETHRAL RESECTION OF  BLADDER TUMOR WITH MITOMYCIN-C N/A 03/24/2016   Procedure: TRANSURETHRAL RESECTION OF BLADDER TUMOR WITH MITOMYCIN-C  (SMALL);  Surgeon: [name], MD;  Location: [facility];   Service: Urology;  Laterality: N/A;  . URETERAL BIOPSY Right 08/19/2017   Procedure: Renal Mass BIOPSY;  Surgeon: [name], MD;  Location: [facility];  Service: Urology;  Laterality: Right;  . URETEROSCOPY Right 01/30/2015   Procedure: URETEROSCOPY/ WITH BIOPSY AND CYTOLOGY BRUSHING;  Surgeon: [name], MD;  Location: [facility];  Service: Urology;  Laterality: Right;  . URETEROSCOPY Right 08/20/2015   Procedure: URETEROSCOPY;  Surgeon: [name], MD;  Location: [facility];  Service: Urology;  Laterality: Right;  . URETEROSCOPY Right 09/12/2015   Procedure: URETEROSCOPY;  Surgeon: [name], MD;  Location: [facility];  Service: Urology;  Laterality: Right;  . URETEROSCOPY Right 02/26/2016   Procedure: URETEROSCOPY;  Surgeon: [name], MD;  Location: [facility];  Service: Urology;  Laterality: Right;    Social History   Socioeconomic History  . Marital status: Married    Spouse name: Not on file  . Number of children: Not on file  . Years of education: Not on file  . Highest education level: Not on file  Occupational History  . Not on file  Tobacco Use  . Smoking status: Never Smoker  . Smokeless tobacco: Never Used  Substance and Sexual Activity  . Alcohol use: No    Alcohol/week: 0.0 standard drinks  . Drug use: No  . Sexual activity: Not on file  Other Topics Concern  . Not on file  Social History Narrative  . Not on file   Social Determinants of Health   Financial Resource Strain:   . Difficulty of Paying Living Expenses: Not on file  Food Insecurity:   . Worried About Charity fundraiser in the Last Year: Not on file  . Ran Out of Food in the Last Year: Not on file  Transportation Needs:   . Lack of Transportation (Medical): Not on file  . Lack of Transportation (Non-Medical): Not on file  Physical Activity:   . Days of Exercise per Week: Not on file  . Minutes of Exercise per Session: Not on file  Stress:   . Feeling of Stress : Not on file    Social Connections:   . Frequency of Communication with Friends and Family: Not on file  . Frequency of Social Gatherings with Friends and Family: Not on file  . Attends Religious Services: Not on file  . Active Member of Clubs or Organizations: Not on file  . Attends Archivist Meetings: Not on file  . Marital Status: Not on file  Intimate Partner Violence:   . Fear of Current or Ex-Partner: Not on file  . Emotionally Abused: Not on file  . Physically Abused: Not on file  . Sexually Abused: Not on file    Family History  Problem Relation Age of Onset  . Hypertension Mother   . Hypertension Father   . Prostate cancer Brother   . Kidney disease Neg Hx   . Kidney cancer Neg Hx   . Bladder Cancer Neg Hx      Current Outpatient Medications:  .  acetaminophen (TYLENOL) 500 MG tablet, Take 1,000 mg by mouth daily as needed for moderate pain. , Disp: , Rfl:  .  amLODipine (NORVASC) 5 MG tablet, Take 5 mg by mouth daily. , Disp: , Rfl:  .  benazepril-hydrochlorthiazide (LOTENSIN HCT) 20-12.5 MG tablet, Take 1  tablet by mouth daily. , Disp: , Rfl:  .  cetirizine (ZYRTEC) 10 MG tablet, Take 10 mg by mouth daily., Disp: , Rfl:  .  diazepam (VALIUM) 5 MG tablet, TAKE 1 TABLET (5 MG TOTAL) BY MOUTH EVERY 12 (TWELVE) HOURS AS NEEDED FOR ANXIETY OR SLEEP, Disp: , Rfl:  .  docusate sodium (COLACE) 100 MG capsule, Take 100 mg by mouth 2 (two) times daily. , Disp: , Rfl:  .  fenofibrate micronized (LOFIBRA) 134 MG capsule, Take 134 mg by mouth daily before breakfast. , Disp: , Rfl:  .  fluticasone (FLONASE) 50 MCG/ACT nasal spray, Place 2 sprays into both nostrils at bedtime., Disp: , Rfl:  .  HYDROcodone-acetaminophen (NORCO/VICODIN) 5-325 MG tablet, Take 1 tablet by mouth every 6 (six) hours as needed for severe pain., Disp: 30 tablet, Rfl: 0 .  mirabegron ER (MYRBETRIQ) 50 MG TB24 tablet, Take 1 tablet (50 mg total) by mouth daily., Disp: 30 tablet, Rfl: 11 .  Multiple Vitamin  (MULTIVITAMIN WITH MINERALS) TABS tablet, Take 1 tablet by mouth daily., Disp: , Rfl:  .  nystatin (MYCOSTATIN/NYSTOP) powder, APPLY TO AFFECTED AREA 4 TIMES A DAY, Disp: 15 g, Rfl: 0 .  Potassium 99 MG TABS, Take 99 mg by mouth at bedtime., Disp: , Rfl:  .  psyllium (METAMUCIL) 58.6 % packet, Take 1 packet by mouth daily. , Disp: , Rfl:  .  tamsulosin (FLOMAX) 0.4 MG CAPS capsule, Take 1 capsule (0.4 mg total) by mouth daily., Disp: 90 capsule, Rfl: 3 .  triamcinolone ointment (KENALOG) 0.5 %, Apply topically 2 (two) times daily as needed. For itching & rash, Disp: 60 g, Rfl: 3  Physical exam:  Vitals:   02/22/19 1053  BP: (!) 138/92  Pulse: 92  Temp: (!) 97.4 F (36.3 C)  TempSrc: Tympanic  Weight: 198 lb 12.8 oz (90.2 kg)   Physical Exam Constitutional:      General: He is not in acute distress. HENT:     Head: Normocephalic and atraumatic.  Eyes:     Pupils: Pupils are equal, round, and reactive to light.  Cardiovascular:     Rate and Rhythm: Normal rate and regular rhythm.     Heart sounds: Normal heart sounds.  Pulmonary:     Effort: Pulmonary effort is normal.     Breath sounds: Normal breath sounds.  Abdominal:     General: Bowel sounds are normal.     Palpations: Abdomen is soft.  Musculoskeletal:     Cervical back: Normal range of motion.  Skin:    General: Skin is warm and dry.     Comments: Skin rash over the trunk and back appears to be improving  Neurological:     Mental Status: He is alert and oriented to person, place, and time.      CMP Latest Ref Rng & Units 02/22/2019  Glucose 70 - 99 mg/dL 119(H)  BUN 8 - 23 mg/dL 24(H)  Creatinine 0.61 - 1.24 mg/dL 1.31(H)  Sodium 135 - 145 mmol/L 133(L)  Potassium 3.5 - 5.1 mmol/L 3.6  Chloride 98 - 111 mmol/L 96(L)  CO2 22 - 32 mmol/L 28  Calcium 8.9 - 10.3 mg/dL 8.9  Total Protein 6.5 - 8.1 g/dL 6.1(L)  Total Bilirubin 0.3 - 1.2 mg/dL 0.9  Alkaline Phos 38 - 126 U/L 42  AST 15 - 41 U/L 27  ALT 0 - 44 U/L  26   CBC Latest Ref Rng & Units 02/22/2019  WBC 4.0 -  10.5 K/uL 10.1  Hemoglobin 13.0 - 17.0 g/dL 14.5  Hematocrit 39.0 - 52.0 % 43.1  Platelets 150 - 400 K/uL 180     Assessment and plan- Patient is a 85 y.o. male  with metastatic urothelial carcinoma with metastases to the lymph nodes.  He is here for on treatment assessment prior to cycle 1 day 8 of padcev  Counts okay to proceed with cycle 1 day 8 of Padcev today.  He is getting it at a lower dose of 1 mg per metered square.  He will directly proceed for cycle 1 day 15 next week and I will see him back in 3 weeks for cycle 2-day 1.  Patient is using artificial tears to prevent dry eyes with padcev.  Also reminded patient to watch out for symptoms of neuropathy  AKI: Patient's creatinine has been between 1.2-1.3 over the last 2 months.  He has not received Keytruda since 12/21/2018.  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  Skin rash is improving and he will be coming off steroids soon  Suspect fatigue could be a sequelae of tapering steroid dose.  Continue to monitor   Visit Diagnosis 1. 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 Encounter for antineoplastic chemotherapy   3. Autoimmune dermatitis   4. AKI (acute kidney injury) ([place])      Dr. Randa Evens, MD, MPH [facility] [phone] 02/22/2019 2:15 PM

## 2019-02-22 NOTE — Progress Notes (Signed)
Pt has  Few spots on back and few on belly. Doing better and not itching him. He did feel washed out a little but he is not sure if it was partly due to tapering off with prednisone. He has sinus HA today and took tylenol and it is gone. The weather causes him to hav edry nose and he use saline with it

## 2019-02-25 ENCOUNTER — Other Ambulatory Visit: Payer: Self-pay | Admitting: Oncology

## 2019-02-28 ENCOUNTER — Other Ambulatory Visit: Payer: Self-pay

## 2019-02-28 ENCOUNTER — Telehealth: Payer: Self-pay | Admitting: *Deleted

## 2019-02-28 NOTE — Telephone Encounter (Signed)
Patient called reporting that his rash is coming back and is requesting prescription for Prednisone for it. Please advise

## 2019-03-01 ENCOUNTER — Other Ambulatory Visit: Payer: Self-pay | Admitting: Hospice and Palliative Medicine

## 2019-03-01 ENCOUNTER — Inpatient Hospital Stay: Payer: Medicare HMO

## 2019-03-01 ENCOUNTER — Other Ambulatory Visit: Payer: Self-pay

## 2019-03-01 ENCOUNTER — Telehealth: Payer: Self-pay | Admitting: Oncology

## 2019-03-01 VITALS — BP 112/71 | HR 76 | Temp 97.0°F | Resp 18 | Wt 200.4 lb

## 2019-03-01 DIAGNOSIS — Z5112 Encounter for antineoplastic immunotherapy: Secondary | ICD-10-CM | POA: Diagnosis not present

## 2019-03-01 DIAGNOSIS — L308 Other specified dermatitis: Secondary | ICD-10-CM

## 2019-03-01 DIAGNOSIS — C669 Malignant neoplasm of unspecified ureter: Secondary | ICD-10-CM

## 2019-03-01 DIAGNOSIS — Z95828 Presence of other vascular implants and grafts: Secondary | ICD-10-CM

## 2019-03-01 LAB — CBC WITH DIFFERENTIAL/PLATELET
Abs Immature Granulocytes: 0.03 10*3/uL (ref 0.00–0.07)
Basophils Absolute: 0 10*3/uL (ref 0.0–0.1)
Basophils Relative: 1 %
Eosinophils Absolute: 0.1 10*3/uL (ref 0.0–0.5)
Eosinophils Relative: 2 %
HCT: 38.8 % — ABNORMAL LOW (ref 39.0–52.0)
Hemoglobin: 13 g/dL (ref 13.0–17.0)
Immature Granulocytes: 1 %
Lymphocytes Relative: 21 %
Lymphs Abs: 1.1 10*3/uL (ref 0.7–4.0)
MCH: 30 pg (ref 26.0–34.0)
MCHC: 33.5 g/dL (ref 30.0–36.0)
MCV: 89.4 fL (ref 80.0–100.0)
Monocytes Absolute: 0.6 10*3/uL (ref 0.1–1.0)
Monocytes Relative: 12 %
Neutro Abs: 3.2 10*3/uL (ref 1.7–7.7)
Neutrophils Relative %: 63 %
Platelets: 161 10*3/uL (ref 150–400)
RBC: 4.34 MIL/uL (ref 4.22–5.81)
RDW: 13.4 % (ref 11.5–15.5)
WBC: 5 10*3/uL (ref 4.0–10.5)
nRBC: 0 % (ref 0.0–0.2)

## 2019-03-01 LAB — COMPREHENSIVE METABOLIC PANEL
ALT: 26 U/L (ref 0–44)
AST: 39 U/L (ref 15–41)
Albumin: 3.3 g/dL — ABNORMAL LOW (ref 3.5–5.0)
Alkaline Phosphatase: 33 U/L — ABNORMAL LOW (ref 38–126)
Anion gap: 8 (ref 5–15)
BUN: 15 mg/dL (ref 8–23)
CO2: 27 mmol/L (ref 22–32)
Calcium: 8.5 mg/dL — ABNORMAL LOW (ref 8.9–10.3)
Chloride: 98 mmol/L (ref 98–111)
Creatinine, Ser: 1.33 mg/dL — ABNORMAL HIGH (ref 0.61–1.24)
GFR calc Af Amer: 56 mL/min — ABNORMAL LOW (ref >60)
GFR calc non Af Amer: 48 mL/min — ABNORMAL LOW (ref >60)
Glucose, Bld: 156 mg/dL — ABNORMAL HIGH (ref 70–99)
Potassium: 3.8 mmol/L (ref 3.5–5.1)
Sodium: 133 mmol/L — ABNORMAL LOW (ref 135–145)
Total Bilirubin: 0.8 mg/dL (ref 0.3–1.2)
Total Protein: 5.9 g/dL — ABNORMAL LOW (ref 6.5–8.1)

## 2019-03-01 MED ORDER — HEPARIN SOD (PORK) LOCK FLUSH 100 UNIT/ML IV SOLN
INTRAVENOUS | Status: AC
  Filled 2019-03-01: qty 5

## 2019-03-01 MED ORDER — SODIUM CHLORIDE 0.9 % IV SOLN
10.0000 mg | Freq: Once | INTRAVENOUS | Status: AC
  Administered 2019-03-01: 10:00:00 10 mg via INTRAVENOUS
  Filled 2019-03-01: qty 1

## 2019-03-01 MED ORDER — HYDROCORTISONE 1 % EX CREA: 1.0000 "application " | TOPICAL_CREAM | Freq: Every day | CUTANEOUS | 1 refills | Status: DC | PRN

## 2019-03-01 MED ORDER — SODIUM CHLORIDE 0.9% FLUSH
10.0000 mL | Freq: Once | INTRAVENOUS | Status: AC
  Administered 2019-03-01: 09:00:00 10 mL via INTRAVENOUS
  Filled 2019-03-01: qty 10

## 2019-03-01 MED ORDER — SODIUM CHLORIDE 0.9 % IV SOLN
Freq: Once | INTRAVENOUS | Status: AC
  Filled 2019-03-01: qty 250

## 2019-03-01 MED ORDER — SODIUM CHLORIDE 0.9 % IV SOLN
90.0000 mg | Freq: Once | INTRAVENOUS | Status: AC
  Administered 2019-03-01: 11:00:00 90 mg via INTRAVENOUS
  Filled 2019-03-01: qty 9

## 2019-03-01 MED ORDER — PREDNISONE 10 MG PO TABS: ORAL_TABLET | ORAL | 0 refills | Status: DC

## 2019-03-01 MED ORDER — PALONOSETRON HCL INJECTION 0.25 MG/5ML
0.2500 mg | Freq: Once | INTRAVENOUS | Status: AC
  Administered 2019-03-01: 0.25 mg via INTRAVENOUS
  Filled 2019-03-01: qty 5

## 2019-03-01 MED ORDER — HEPARIN SOD (PORK) LOCK FLUSH 100 UNIT/ML IV SOLN
500.0000 [IU] | Freq: Once | INTRAVENOUS | Status: AC | PRN
  Administered 2019-03-01: 11:00:00 500 [IU]
  Filled 2019-03-01: qty 5

## 2019-03-01 NOTE — Telephone Encounter (Signed)
Re: Skin Rash   Patient seen in infusion prior to treatment for recurrent rash.  Patient states he finished his course of steroids for autoimmune dermatitis approximately 9 days ago from [place]. Recently started padcev (third line). Today he is scheduled to receive cycle 1 day 15 padcev. Two days after stopping steroids his rash appeared almost completely resolved except for 2 small spots on his abdomen.  He continued his steroid cream as prescribed.  His rash returned and was to noted to have spread from abdomen to back and arms 3 days ago. Developed a rash on bilateral inner thighs 2 days ago.  Rash is pruritus causing insomnia and discomfort.  Continues antihistamines and Benadryl.  Spoke with Dr. Grayland Ormond and Billey Chang, NP.  Patient was supposed to have an appointment with dermatology but this has not been scheduled.  We will see that he gets an appointment.  We will restart prednisone taper and hydrocortisone cream. Billey Chang, NP sent Rx.   Faythe Casa, NP 03/01/2019 1:01 PM

## 2019-03-01 NOTE — Telephone Encounter (Signed)
I called patient and left message regarding that he was to see Dermatology regarding his rash, I had to leave a message on his voice mail to call back and let me know if he went to this appointment or not.

## 2019-03-01 NOTE — Progress Notes (Signed)
Patient has been treated for autoimmune dermatitis from [facility] with prednisone for the past month. Rash has returned with discontinuation of prednisone. Patient seen in infusion by Rulon Abide, NP. Will restart prednisone taper with hydrocortisone cream. He has been referred to dermatology. He may need chronic low dose prednisone until he can be evaluated by dermatologist.

## 2019-03-02 NOTE — Telephone Encounter (Signed)
Patient has not seen seen dermatology yet, but called yesterday and made an appointment for Monday. He will see what they say and keep his follow up appointment with [place] for 03/15/19

## 2019-03-15 ENCOUNTER — Encounter: Payer: Self-pay | Admitting: Oncology

## 2019-03-15 ENCOUNTER — Other Ambulatory Visit: Payer: Self-pay

## 2019-03-15 ENCOUNTER — Inpatient Hospital Stay: Payer: PPO

## 2019-03-15 ENCOUNTER — Inpatient Hospital Stay (HOSPITAL_BASED_OUTPATIENT_CLINIC_OR_DEPARTMENT_OTHER): Payer: PPO | Admitting: Oncology

## 2019-03-15 ENCOUNTER — Other Ambulatory Visit: Payer: Self-pay | Admitting: Urology

## 2019-03-15 ENCOUNTER — Inpatient Hospital Stay: Payer: PPO | Attending: Oncology

## 2019-03-15 VITALS — BP 113/68 | HR 102 | Temp 97.6°F | Resp 16 | Wt 198.2 lb

## 2019-03-15 DIAGNOSIS — N400 Enlarged prostate without lower urinary tract symptoms: Secondary | ICD-10-CM | POA: Diagnosis not present

## 2019-03-15 DIAGNOSIS — I2510 Atherosclerotic heart disease of native coronary artery without angina pectoris: Secondary | ICD-10-CM | POA: Insufficient documentation

## 2019-03-15 DIAGNOSIS — Z79899 Other long term (current) drug therapy: Secondary | ICD-10-CM | POA: Insufficient documentation

## 2019-03-15 DIAGNOSIS — Z885 Allergy status to narcotic agent status: Secondary | ICD-10-CM | POA: Insufficient documentation

## 2019-03-15 DIAGNOSIS — Z5112 Encounter for antineoplastic immunotherapy: Secondary | ICD-10-CM | POA: Insufficient documentation

## 2019-03-15 DIAGNOSIS — C679 Malignant neoplasm of bladder, unspecified: Secondary | ICD-10-CM | POA: Insufficient documentation

## 2019-03-15 DIAGNOSIS — Z8042 Family history of malignant neoplasm of prostate: Secondary | ICD-10-CM | POA: Diagnosis not present

## 2019-03-15 DIAGNOSIS — Z8249 Family history of ischemic heart disease and other diseases of the circulatory system: Secondary | ICD-10-CM | POA: Diagnosis not present

## 2019-03-15 DIAGNOSIS — K8020 Calculus of gallbladder without cholecystitis without obstruction: Secondary | ICD-10-CM | POA: Diagnosis not present

## 2019-03-15 DIAGNOSIS — T451X5A Adverse effect of antineoplastic and immunosuppressive drugs, initial encounter: Secondary | ICD-10-CM | POA: Insufficient documentation

## 2019-03-15 DIAGNOSIS — L270 Generalized skin eruption due to drugs and medicaments taken internally: Secondary | ICD-10-CM | POA: Insufficient documentation

## 2019-03-15 DIAGNOSIS — C775 Secondary and unspecified malignant neoplasm of intrapelvic lymph nodes: Secondary | ICD-10-CM | POA: Diagnosis not present

## 2019-03-15 DIAGNOSIS — Z882 Allergy status to sulfonamides status: Secondary | ICD-10-CM | POA: Insufficient documentation

## 2019-03-15 DIAGNOSIS — R21 Rash and other nonspecific skin eruption: Secondary | ICD-10-CM | POA: Insufficient documentation

## 2019-03-15 DIAGNOSIS — Z85828 Personal history of other malignant neoplasm of skin: Secondary | ICD-10-CM | POA: Diagnosis not present

## 2019-03-15 DIAGNOSIS — L309 Dermatitis, unspecified: Secondary | ICD-10-CM | POA: Diagnosis not present

## 2019-03-15 DIAGNOSIS — C669 Malignant neoplasm of unspecified ureter: Secondary | ICD-10-CM

## 2019-03-15 DIAGNOSIS — M1990 Unspecified osteoarthritis, unspecified site: Secondary | ICD-10-CM | POA: Insufficient documentation

## 2019-03-15 DIAGNOSIS — Z87442 Personal history of urinary calculi: Secondary | ICD-10-CM | POA: Insufficient documentation

## 2019-03-15 DIAGNOSIS — R911 Solitary pulmonary nodule: Secondary | ICD-10-CM | POA: Insufficient documentation

## 2019-03-15 DIAGNOSIS — I700 Atherosclerosis of aorta: Secondary | ICD-10-CM | POA: Insufficient documentation

## 2019-03-15 DIAGNOSIS — I10 Essential (primary) hypertension: Secondary | ICD-10-CM | POA: Diagnosis not present

## 2019-03-15 DIAGNOSIS — Z5111 Encounter for antineoplastic chemotherapy: Secondary | ICD-10-CM | POA: Diagnosis not present

## 2019-03-15 DIAGNOSIS — Z95828 Presence of other vascular implants and grafts: Secondary | ICD-10-CM

## 2019-03-15 DIAGNOSIS — N401 Enlarged prostate with lower urinary tract symptoms: Secondary | ICD-10-CM

## 2019-03-15 LAB — CBC WITH DIFFERENTIAL/PLATELET
Abs Immature Granulocytes: 0.08 10*3/uL — ABNORMAL HIGH (ref 0.00–0.07)
Basophils Absolute: 0.1 10*3/uL (ref 0.0–0.1)
Basophils Relative: 1 %
Eosinophils Absolute: 0 10*3/uL (ref 0.0–0.5)
Eosinophils Relative: 1 %
HCT: 39.1 % (ref 39.0–52.0)
Hemoglobin: 13.3 g/dL (ref 13.0–17.0)
Immature Granulocytes: 2 %
Lymphocytes Relative: 28 %
Lymphs Abs: 1.5 10*3/uL (ref 0.7–4.0)
MCH: 30.2 pg (ref 26.0–34.0)
MCHC: 34 g/dL (ref 30.0–36.0)
MCV: 88.7 fL (ref 80.0–100.0)
Monocytes Absolute: 0.6 10*3/uL (ref 0.1–1.0)
Monocytes Relative: 12 %
Neutro Abs: 3.1 10*3/uL (ref 1.7–7.7)
Neutrophils Relative %: 56 %
Platelets: 295 10*3/uL (ref 150–400)
RBC: 4.41 MIL/uL (ref 4.22–5.81)
RDW: 14.3 % (ref 11.5–15.5)
WBC: 5.4 10*3/uL (ref 4.0–10.5)
nRBC: 0 % (ref 0.0–0.2)

## 2019-03-15 LAB — COMPREHENSIVE METABOLIC PANEL
ALT: 24 U/L (ref 0–44)
AST: 31 U/L (ref 15–41)
Albumin: 3.4 g/dL — ABNORMAL LOW (ref 3.5–5.0)
Alkaline Phosphatase: 39 U/L (ref 38–126)
Anion gap: 8 (ref 5–15)
BUN: 21 mg/dL (ref 8–23)
CO2: 26 mmol/L (ref 22–32)
Calcium: 8.5 mg/dL — ABNORMAL LOW (ref 8.9–10.3)
Chloride: 100 mmol/L (ref 98–111)
Creatinine, Ser: 1.35 mg/dL — ABNORMAL HIGH (ref 0.61–1.24)
GFR calc Af Amer: 55 mL/min — ABNORMAL LOW (ref >60)
GFR calc non Af Amer: 48 mL/min — ABNORMAL LOW (ref >60)
Glucose, Bld: 157 mg/dL — ABNORMAL HIGH (ref 70–99)
Potassium: 3.9 mmol/L (ref 3.5–5.1)
Sodium: 134 mmol/L — ABNORMAL LOW (ref 135–145)
Total Bilirubin: 0.6 mg/dL (ref 0.3–1.2)
Total Protein: 6.1 g/dL — ABNORMAL LOW (ref 6.5–8.1)

## 2019-03-15 MED ORDER — HEPARIN SOD (PORK) LOCK FLUSH 100 UNIT/ML IV SOLN
500.0000 [IU] | Freq: Once | INTRAVENOUS | Status: AC
  Administered 2019-03-15: 500 [IU] via INTRAVENOUS
  Filled 2019-03-15: qty 5

## 2019-03-15 MED ORDER — SODIUM CHLORIDE 0.9% FLUSH
10.0000 mL | Freq: Once | INTRAVENOUS | Status: AC
  Administered 2019-03-15: 09:00:00 10 mL via INTRAVENOUS
  Filled 2019-03-15: qty 10

## 2019-03-15 NOTE — Progress Notes (Signed)
Hematology/Oncology Consult note [facility]  Telephone:([phone] [phone]  Patient Care Team: Idelle Crouch, MD as PCP - General (Internal Medicine)   Name of the patient: Anthony Gonzales  [id number]  [birth year]   Date of visit: 03/15/19  Diagnosis- Metastatic urothelial carcinoma with possible mets to the obturator node. Indeterminate hilar lymph nodes and LUL lung nodule  Chief complaint/ Reason for visit-on treatment assessment prior to cycle 2-day 1 of Padcev  Heme/Onc history: patient is a 86 year old male with past medical history significant for hypertension and long-standing history of superficial bladder cancer for which he sees [name]. He has undergone TURBT as well as ureteroscopy since 2017 along with mitomycin as well in the past. Most recently he underwent CT abdomen on 08/06/2017 which showed a soft tissue mass in the right renal pelvis concerning for upper tract urothelial neoplasm. Soft tissue fullness at the right ureterovesical junction with proximal right hydroureteronephrosis. Several millimeter attenuation lesion in the pancreatic head.  He underwent diagnostic ureteroscopy and was found to have 2 high-grade lesions within his right kidney. There was a nodular high-grade appearing lesion in the right anterior renal pelvis. There was also a second ureteral tumor fungating from the right ureteral orifice extending into the distal ureter also consistent with high-grade invasive urothelial carcinoma. Muscle invasion could not be assessed. He also underwent a CT chest which showed a rounded nodule in the right upper lobe measuring 14 mm concerning for metastases. 2 other 3 mm lesions were also noted in the left upper lobe likely benign  Plan initially was neoadjuvant chemotherapy followed by possible surgery but given the presence of lung lesion patient has been referred to oncology for the same.  PET/CT on  09/21/17 showed:IMPRESSION: 1. Hypermetabolic right obturator lymph node is most indicative of metastatic disease. 2. Mildly hypermetabolic left hilar lymph nodes are nonspecific. Continued attention on follow-up exams is warranted. 3. Right upper lobe pulmonary nodule shows metabolism after just above blood pool and is therefore indeterminate. Continued attention on follow-up exams is warranted. 4. Aortic atherosclerosis (ICD10-170.0). Coronary artery calcification. 5. Cholelithiasis  Carboplatin/ gemzar1 week on and one-week off as patient could not tolerate 2-week on and one week off regimen. Cycle 1 started on 09/22/2017 Disease progression in March 2020. Switched to second [employer]  Patient had disease controlled with Keytruda. However he was noted to have worsening dermatitis with constant flareups requiring steroids. Beryle Flock is therefore being kept on hold and patient will be switched to third line Padcev   Interval history-patient last received Keytruda back in October 2020.  Despite stopping Keytruda patient has had on and off rash which mainly flares up in his trunk and back.  He also saw Dr. Evorn Gong from dermatology and was asked to increase the dose of his topical steroids.  Presently patient reports that he is has a rash but it has not been bothering him as much and in fact it looks better today than it has been in the past.  Denies other complaints at this time  ECOG PS- 1 Pain scale- 0 Opioid associated constipation- no  Review of systems- Review of Systems  Constitutional: Negative for chills, fever, malaise/fatigue and weight loss.  HENT: Negative for congestion, ear discharge and nosebleeds.   Eyes: Negative for blurred vision.  Respiratory: Negative for cough, hemoptysis, sputum production, shortness of breath and wheezing.   Cardiovascular: Negative for chest pain, palpitations, orthopnea and claudication.  Gastrointestinal: Negative for abdominal pain,  blood in stool, constipation, diarrhea, heartburn, melena, nausea and vomiting.  Genitourinary: Negative for dysuria, flank pain, frequency, hematuria and urgency.  Musculoskeletal: Negative for back pain, joint pain and myalgias.  Skin: Positive for rash.  Neurological: Negative for dizziness, tingling, focal weakness, seizures, weakness and headaches.  Endo/Heme/Allergies: Does not bruise/bleed easily.  Psychiatric/Behavioral: Negative for depression and suicidal ideas. The patient does not have insomnia.       Allergies  Allergen Reactions  . Demerol [Meperidine] Nausea And Vomiting  . Lipitor [Atorvastatin] Swelling  . Sulfa Antibiotics Nausea And Vomiting and Rash     Past Medical History:  Diagnosis Date  . Arthritis   . Benign fibroma of prostate 08/23/2013  . BPH (benign prostatic hyperplasia)   . Calculus of kidney 08/23/2013  . Glaucoma    no drops in 3 mo pressure good, pt denies glaucoma, eye pressure has been measuring alright.  . History of kidney stones   . HLD (hyperlipidemia)   . HOH (hard of hearing)    Left Hearing Aid  . HTN (hypertension) 12/26/2014  . Hypertension   . Hyponatremia 12/26/2014  . Migraines    history of migraines when he was younger.  [name] Restless leg syndrome   . Sinus drainage   . Skin cancer   . Urothelial cancer ([place])    chemo tx's.  [name] UTI (lower urinary tract infection) 12/26/2014  . Vertigo      Past Surgical History:  Procedure Laterality Date  . COLONOSCOPY    . CYSTOSCOPY W/ RETROGRADES Right 01/30/2015   Procedure: CYSTOSCOPY WITH RETROGRADE PYELOGRAM;  Surgeon: [name], MD;  Location: [facility];  Service: Urology;  Laterality: Right;  . CYSTOSCOPY W/ RETROGRADES Bilateral 02/26/2016   Procedure: CYSTOSCOPY WITH RETROGRADE PYELOGRAM;  Surgeon: [name], MD;  Location: [facility];  Service: Urology;  Laterality: Bilateral;  . CYSTOSCOPY W/ URETERAL STENT PLACEMENT Right 08/20/2015   Procedure: CYSTOSCOPY WITH  RETROGRADE PYELOGRAM/POSSIBLE URETERAL STENT PLACEMENT/BLADDER BIOPSY;  Surgeon: [name], MD;  Location: [facility];  Service: Urology;  Laterality: Right;  . CYSTOSCOPY W/ URETERAL STENT PLACEMENT Right 09/12/2015   Procedure: CYSTOSCOPY WITH STENT REPLACEMENT;  Surgeon: [name], MD;  Location: [facility];  Service: Urology;  Laterality: Right;  . CYSTOSCOPY WITH BIOPSY Right 09/12/2015   Procedure: CYSTOSCOPY WITH BLADDER AND URETERAL BIOPSY;  Surgeon: [name], MD;  Location: [facility];  Service: Urology;  Laterality: Right;  . CYSTOSCOPY WITH STENT PLACEMENT Right 01/30/2015   Procedure: CYSTOSCOPY WITH STENT PLACEMENT;  Surgeon: [name], MD;  Location: [facility];  Service: Urology;  Laterality: Right;  . CYSTOSCOPY WITH STENT PLACEMENT Right 12/28/2017   Procedure: [place] WITH STENT Exchange;  Surgeon: [name], MD;  Location: [facility];  Service: Urology;  Laterality: Right;  . CYSTOSCOPY/URETEROSCOPY/HOLMIUM LASER/STENT PLACEMENT Right 08/19/2017   Procedure: CYSTOSCOPY/URETEROSCOPY/HOLMIUM LASER/STENT PLACEMENT;  Surgeon: [name], MD;  Location: [facility];  Service: Urology;  Laterality: Right;  . EYE SURGERY Bilateral    Cataract Extraction with IOL  . goiter removal    . HOLMIUM LASER APPLICATION N/A 8/34/1962   Procedure:  HOLMIUM LASER APPLICATION;  Surgeon: [name], MD;  Location: [facility];  Service: Urology;  Laterality: N/A;  . PORTA CATH INSERTION N/A 09/23/2017   Procedure: PORTA CATH INSERTION;  Surgeon: Algernon Huxley, MD;  Location: [facility];  Service: Cardiovascular;  Laterality: N/A;  . SPERMATOCELECTOMY    . TONSILLECTOMY    . TRANSURETHRAL RESECTION OF BLADDER TUMOR N/A 02/26/2016  Procedure: TRANSURETHRAL RESECTION OF BLADDER TUMOR (TURBT);  Surgeon: [name], MD;  Location: [facility];  Service: Urology;  Laterality: N/A;  . TRANSURETHRAL RESECTION OF BLADDER TUMOR WITH MITOMYCIN-C N/A 09/12/2015   Procedure:  TRANSURETHRAL RESECTION OF BLADDER TUMOR ;  Surgeon: [name], MD;  Location: [facility];  Service: Urology;  Laterality: N/A;  . TRANSURETHRAL RESECTION OF BLADDER TUMOR WITH MITOMYCIN-C N/A 03/24/2016   Procedure: TRANSURETHRAL RESECTION OF BLADDER TUMOR WITH MITOMYCIN-C  (SMALL);  Surgeon: [name], MD;  Location: [facility];  Service: Urology;  Laterality: N/A;  . URETERAL BIOPSY Right 08/19/2017   Procedure: Renal Mass BIOPSY;  Surgeon: [name], MD;  Location: [facility];  Service: Urology;  Laterality: Right;  . URETEROSCOPY Right 01/30/2015   Procedure: URETEROSCOPY/ WITH BIOPSY AND CYTOLOGY BRUSHING;  Surgeon: [name], MD;  Location: [facility];  Service: Urology;  Laterality: Right;  . URETEROSCOPY Right 08/20/2015   Procedure: URETEROSCOPY;  Surgeon: [name], MD;  Location: [facility];  Service: Urology;  Laterality: Right;  . URETEROSCOPY Right 09/12/2015   Procedure: URETEROSCOPY;  Surgeon: [name], MD;  Location: [facility];  Service: Urology;  Laterality: Right;  . URETEROSCOPY Right 02/26/2016   Procedure: URETEROSCOPY;  Surgeon: [name], MD;  Location: [facility];  Service: Urology;  Laterality: Right;    Social History   Socioeconomic History  . Marital status: Married    Spouse name: Not on file  . Number of children: Not on file  . Years of education: Not on file  . Highest education level: Not on file  Occupational History  . Not on file  Tobacco Use  . Smoking status: Never Smoker  . Smokeless tobacco: Never Used  Substance and Sexual Activity  . Alcohol use: No    Alcohol/week: 0.0 standard drinks  . Drug use: No  . Sexual activity: Not on file  Other Topics Concern  . Not on file  Social History Narrative  . Not on file   Social Determinants of Health   Financial Resource Strain:   . Difficulty of Paying Living Expenses: Not on file  Food Insecurity:   . Worried About Charity fundraiser in the Last Year: Not on file  .  Ran Out of Food in the Last Year: Not on file  Transportation Needs:   . Lack of Transportation (Medical): Not on file  . Lack of Transportation (Non-Medical): Not on file  Physical Activity:   . Days of Exercise per Week: Not on file  . Minutes of Exercise per Session: Not on file  Stress:   . Feeling of Stress : Not on file  Social Connections:   . Frequency of Communication with Friends and Family: Not on file  . Frequency of Social Gatherings with Friends and Family: Not on file  . Attends Religious Services: Not on file  . Active Member of Clubs or Organizations: Not on file  . Attends Archivist Meetings: Not on file  . Marital Status: Not on file  Intimate Partner Violence:   . Fear of Current or Ex-Partner: Not on file  . Emotionally Abused: Not on file  . Physically Abused: Not on file  . Sexually Abused: Not on file    Family History  Problem Relation Age of Onset  . Hypertension Mother   . Hypertension Father   . Prostate cancer Brother   . Kidney disease Neg Hx   . Kidney cancer Neg Hx   . Bladder Cancer Neg  Hx      Current Outpatient Medications:  .  acetaminophen (TYLENOL) 500 MG tablet, Take 1,000 mg by mouth daily as needed for moderate pain. , Disp: , Rfl:  .  amLODipine (NORVASC) 5 MG tablet, Take 5 mg by mouth daily. , Disp: , Rfl:  .  benazepril-hydrochlorthiazide (LOTENSIN HCT) 20-12.5 MG tablet, Take 1 tablet by mouth daily. , Disp: , Rfl:  .  cetirizine (ZYRTEC) 10 MG tablet, Take 10 mg by mouth daily., Disp: , Rfl:  .  diazepam (VALIUM) 5 MG tablet, TAKE 1 TABLET (5 MG TOTAL) BY MOUTH EVERY 12 (TWELVE) HOURS AS NEEDED FOR ANXIETY OR SLEEP, Disp: , Rfl:  .  docusate sodium (COLACE) 100 MG capsule, Take 100 mg by mouth 2 (two) times daily. , Disp: , Rfl:  .  fenofibrate micronized (LOFIBRA) 134 MG capsule, Take 134 mg by mouth daily before breakfast. , Disp: , Rfl:  .  fluticasone (FLONASE) 50 MCG/ACT nasal spray, Place 2 sprays into both  nostrils at bedtime., Disp: , Rfl:  .  HYDROcodone-acetaminophen (NORCO/VICODIN) 5-325 MG tablet, Take 1 tablet by mouth every 6 (six) hours as needed for severe pain., Disp: 30 tablet, Rfl: 0 .  hydrocortisone cream 1 %, Apply 1 application topically daily as needed for itching., Disp: 30 g, Rfl: 1 .  mirabegron ER (MYRBETRIQ) 50 MG TB24 tablet, Take 1 tablet (50 mg total) by mouth daily., Disp: 30 tablet, Rfl: 11 .  Multiple Vitamin (MULTIVITAMIN WITH MINERALS) TABS tablet, Take 1 tablet by mouth daily., Disp: , Rfl:  .  nystatin (MYCOSTATIN/NYSTOP) powder, APPLY TO AFFECTED AREA 4 TIMES A DAY, Disp: 15 g, Rfl: 0 .  Potassium 99 MG TABS, Take 99 mg by mouth at bedtime., Disp: , Rfl:  .  psyllium (METAMUCIL) 58.6 % packet, Take 1 packet by mouth daily. , Disp: , Rfl:  .  tamsulosin (FLOMAX) 0.4 MG CAPS capsule, Take 1 capsule (0.4 mg total) by mouth daily., Disp: 90 capsule, Rfl: 3 .  triamcinolone ointment (KENALOG) 0.5 %, APPLY TO AFFECTED AREA TWICE A DAY, Disp: 60 g, Rfl: 3  Physical exam:  Vitals:   03/15/19 0902  BP: 113/68  Pulse: (!) 102  Resp: 16  Temp: 97.6 F (36.4 C)  TempSrc: Tympanic  Weight: 198 lb 3.2 oz (89.9 kg)   Physical Exam Constitutional:      General: He is not in acute distress. HENT:     Head: Normocephalic and atraumatic.  Eyes:     Pupils: Pupils are equal, round, and reactive to light.  Cardiovascular:     Rate and Rhythm: Normal rate and regular rhythm.     Heart sounds: Normal heart sounds.  Pulmonary:     Effort: Pulmonary effort is normal.     Breath sounds: Normal breath sounds.  Abdominal:     General: Bowel sounds are normal.     Palpations: Abdomen is soft.  Musculoskeletal:     Cervical back: Normal range of motion.  Skin:    General: Skin is warm and dry.     Comments: Scattered maculopapular rash seen over the trunk and back and seen in the picture.  Healing areas of rash noted over medial aspect of both thighs.  Neurological:      Mental Status: He is alert and oriented to person, place, and time.      CMP Latest Ref Rng & Units 03/15/2019  Glucose 70 - 99 mg/dL 157(H)  BUN 8 - 23 mg/dL 21  Creatinine 0.61 - 1.24 mg/dL 1.35(H)  Sodium 135 - 145 mmol/L 134(L)  Potassium 3.5 - 5.1 mmol/L 3.9  Chloride 98 - 111 mmol/L 100  CO2 22 - 32 mmol/L 26  Calcium 8.9 - 10.3 mg/dL 8.5(L)  Total Protein 6.5 - 8.1 g/dL 6.1(L)  Total Bilirubin 0.3 - 1.2 mg/dL 0.6  Alkaline Phos 38 - 126 U/L 39  AST 15 - 41 U/L 31  ALT 0 - 44 U/L 24   CBC Latest Ref Rng & Units 03/15/2019  WBC 4.0 - 10.5 K/uL 5.4  Hemoglobin 13.0 - 17.0 g/dL 13.3  Hematocrit 39.0 - 52.0 % 39.1  Platelets 150 - 400 K/uL 295         Assessment and plan- Patient is a 86 y.o. male with metastatic urothelial carcinoma with metastases to the lymph nodes.   He is here for on treatment assessment prior to cycle 2-day 1 of Padcev  Drug-induced skin rash: Patient continues to have flareups of his maculopapular rash over his trunk and back despite stopping Keytruda back in October 2020.  He has periodically received courses of oral steroids in addition to topical steroids that he has been using.  Presently rash does not seem to be severe or bothering him.  I also discussed with him that [place] is also associated with drug-induced skin rash.  We were unable to use Keytruda despite having a positive recurrence cancer with positive skin rash and I am hesitant to stop Padcev as well.  It would mean we have to switch him to chemotherapy and that would limit his options to treat his underlying urothelial carcinoma.  For now patient will continue with topical steroids and if his rash gets worse he will get in touch with [place]  Due to holidays we have not been able to procure the supply for Padcev and he will therefore not be receiving his chemotherapy today.  As soon as we receive the supply we will give the patient a call and he will proceed with cycle 2-day 1 of Padcev 1 time day  and cycle 2-day 8 of Padcev the following week.  I will see him back tentatively in 4 weeks time for cycle 3-day 1 of Padcev.  I plan to give him 2 weeks on 1 week off course   Visit Diagnosis 1. Encounter for antineoplastic chemotherapy   2. 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 Drug-induced skin rash      Dr. Randa Evens, MD, MPH [facility] at [facility] [phone] 03/15/2019 2:34 PM

## 2019-03-15 NOTE — Progress Notes (Signed)
Pt rash is best today than it has been. He had a shower on Monday and it was stinging pain and pt had salve and it helped. He had a spot in between his legs at thigh and 1 spot on each side. He also reports that his taste buds are not good,-he states it feels lika dirty tooth in his mouth.

## 2019-03-18 ENCOUNTER — Other Ambulatory Visit: Payer: Medicare HMO

## 2019-03-18 ENCOUNTER — Other Ambulatory Visit: Payer: Self-pay

## 2019-03-18 ENCOUNTER — Other Ambulatory Visit: Payer: Self-pay | Admitting: Oncology

## 2019-03-18 ENCOUNTER — Ambulatory Visit: Payer: Medicare HMO

## 2019-03-18 ENCOUNTER — Inpatient Hospital Stay: Payer: PPO

## 2019-03-18 VITALS — BP 127/68 | HR 98 | Temp 96.2°F | Wt 198.2 lb

## 2019-03-18 DIAGNOSIS — Z5112 Encounter for antineoplastic immunotherapy: Secondary | ICD-10-CM | POA: Diagnosis not present

## 2019-03-18 DIAGNOSIS — C669 Malignant neoplasm of unspecified ureter: Secondary | ICD-10-CM

## 2019-03-18 MED ORDER — HEPARIN SOD (PORK) LOCK FLUSH 100 UNIT/ML IV SOLN
500.0000 [IU] | Freq: Once | INTRAVENOUS | Status: AC | PRN
  Administered 2019-03-18: 500 [IU]
  Filled 2019-03-18: qty 5

## 2019-03-18 MED ORDER — PALONOSETRON HCL INJECTION 0.25 MG/5ML
0.2500 mg | Freq: Once | INTRAVENOUS | Status: AC
  Administered 2019-03-18: 0.25 mg via INTRAVENOUS
  Filled 2019-03-18: qty 5

## 2019-03-18 MED ORDER — SODIUM CHLORIDE 0.9 % IV SOLN
10.0000 mg | Freq: Once | INTRAVENOUS | Status: AC
  Administered 2019-03-18: 10 mg via INTRAVENOUS
  Filled 2019-03-18: qty 10

## 2019-03-18 MED ORDER — SODIUM CHLORIDE 0.9 % IV SOLN
Freq: Once | INTRAVENOUS | Status: AC
  Filled 2019-03-18: qty 250

## 2019-03-18 MED ORDER — SODIUM CHLORIDE 0.9 % IV SOLN
90.0000 mg | Freq: Once | INTRAVENOUS | Status: AC
  Administered 2019-03-18: 90 mg via INTRAVENOUS
  Filled 2019-03-18: qty 9

## 2019-03-18 NOTE — Progress Notes (Signed)
Per Dr. Janese Banks proceed with scheduled Padcev with 03/15/2019 labs, no need to recheck Glucose levels at this time.

## 2019-03-23 ENCOUNTER — Telehealth: Payer: Self-pay | Admitting: *Deleted

## 2019-03-23 NOTE — Telephone Encounter (Signed)
Patient called stating he is scheduled for COVID Vaccine Friday and is asking if it is ok for him to take it. Please advise

## 2019-03-23 NOTE — Telephone Encounter (Signed)
Pt was contacted and let him know that he can take covid vaccine per Dr. Janese Banks- left this info on his voicemail.

## 2019-03-24 DIAGNOSIS — L270 Generalized skin eruption due to drugs and medicaments taken internally: Secondary | ICD-10-CM | POA: Diagnosis not present

## 2019-03-25 ENCOUNTER — Inpatient Hospital Stay: Payer: PPO

## 2019-03-25 ENCOUNTER — Other Ambulatory Visit: Payer: Self-pay

## 2019-03-25 ENCOUNTER — Inpatient Hospital Stay: Payer: PPO | Admitting: *Deleted

## 2019-03-25 VITALS — BP 149/77 | HR 77 | Temp 96.3°F | Resp 18 | Wt 199.1 lb

## 2019-03-25 DIAGNOSIS — C669 Malignant neoplasm of unspecified ureter: Secondary | ICD-10-CM

## 2019-03-25 DIAGNOSIS — Z5112 Encounter for antineoplastic immunotherapy: Secondary | ICD-10-CM | POA: Diagnosis not present

## 2019-03-25 DIAGNOSIS — Z95828 Presence of other vascular implants and grafts: Secondary | ICD-10-CM

## 2019-03-25 LAB — COMPREHENSIVE METABOLIC PANEL
ALT: 20 U/L (ref 0–44)
AST: 31 U/L (ref 15–41)
Albumin: 3.5 g/dL (ref 3.5–5.0)
Alkaline Phosphatase: 41 U/L (ref 38–126)
Anion gap: 7 (ref 5–15)
BUN: 20 mg/dL (ref 8–23)
CO2: 27 mmol/L (ref 22–32)
Calcium: 8.8 mg/dL — ABNORMAL LOW (ref 8.9–10.3)
Chloride: 98 mmol/L (ref 98–111)
Creatinine, Ser: 1.15 mg/dL (ref 0.61–1.24)
GFR calc Af Amer: >60 mL/min (ref >60)
GFR calc non Af Amer: 58 mL/min — ABNORMAL LOW (ref >60)
Glucose, Bld: 126 mg/dL — ABNORMAL HIGH (ref 70–99)
Potassium: 3.9 mmol/L (ref 3.5–5.1)
Sodium: 132 mmol/L — ABNORMAL LOW (ref 135–145)
Total Bilirubin: 0.5 mg/dL (ref 0.3–1.2)
Total Protein: 6 g/dL — ABNORMAL LOW (ref 6.5–8.1)

## 2019-03-25 LAB — CBC WITH DIFFERENTIAL/PLATELET
Abs Immature Granulocytes: 0.05 10*3/uL (ref 0.00–0.07)
Basophils Absolute: 0.1 10*3/uL (ref 0.0–0.1)
Basophils Relative: 1 %
Eosinophils Absolute: 0.1 10*3/uL (ref 0.0–0.5)
Eosinophils Relative: 1 %
HCT: 37.2 % — ABNORMAL LOW (ref 39.0–52.0)
Hemoglobin: 12.5 g/dL — ABNORMAL LOW (ref 13.0–17.0)
Immature Granulocytes: 1 %
Lymphocytes Relative: 20 %
Lymphs Abs: 1.4 10*3/uL (ref 0.7–4.0)
MCH: 30.2 pg (ref 26.0–34.0)
MCHC: 33.6 g/dL (ref 30.0–36.0)
MCV: 89.9 fL (ref 80.0–100.0)
Monocytes Absolute: 0.7 10*3/uL (ref 0.1–1.0)
Monocytes Relative: 10 %
Neutro Abs: 4.9 10*3/uL (ref 1.7–7.7)
Neutrophils Relative %: 67 %
Platelets: 212 10*3/uL (ref 150–400)
RBC: 4.14 MIL/uL — ABNORMAL LOW (ref 4.22–5.81)
RDW: 14.4 % (ref 11.5–15.5)
WBC: 7.2 10*3/uL (ref 4.0–10.5)
nRBC: 0 % (ref 0.0–0.2)

## 2019-03-25 MED ORDER — HEPARIN SOD (PORK) LOCK FLUSH 100 UNIT/ML IV SOLN
INTRAVENOUS | Status: AC
  Filled 2019-03-25: qty 5

## 2019-03-25 MED ORDER — PALONOSETRON HCL INJECTION 0.25 MG/5ML
0.2500 mg | Freq: Once | INTRAVENOUS | Status: AC
  Administered 2019-03-25: 0.25 mg via INTRAVENOUS
  Filled 2019-03-25: qty 5

## 2019-03-25 MED ORDER — SODIUM CHLORIDE 0.9% FLUSH
10.0000 mL | Freq: Once | INTRAVENOUS | Status: AC
  Administered 2019-03-25: 10 mL via INTRAVENOUS
  Filled 2019-03-25: qty 10

## 2019-03-25 MED ORDER — SODIUM CHLORIDE 0.9 % IV SOLN
10.0000 mg | Freq: Once | INTRAVENOUS | Status: AC
  Administered 2019-03-25: 12:00:00 10 mg via INTRAVENOUS
  Filled 2019-03-25: qty 10

## 2019-03-25 MED ORDER — HEPARIN SOD (PORK) LOCK FLUSH 100 UNIT/ML IV SOLN
500.0000 [IU] | Freq: Once | INTRAVENOUS | Status: AC | PRN
  Administered 2019-03-25: 13:00:00 500 [IU]
  Filled 2019-03-25: qty 5

## 2019-03-25 MED ORDER — SODIUM CHLORIDE 0.9 % IV SOLN
Freq: Once | INTRAVENOUS | Status: AC
  Filled 2019-03-25: qty 250

## 2019-03-25 MED ORDER — SODIUM CHLORIDE 0.9 % IV SOLN
90.0000 mg | Freq: Once | INTRAVENOUS | Status: AC
  Administered 2019-03-25: 90 mg via INTRAVENOUS
  Filled 2019-03-25: qty 9

## 2019-03-25 NOTE — Progress Notes (Signed)
Sodium 132, Dr. Janese Banks aware, per Dr. Everlene Farrier at this time.

## 2019-03-29 ENCOUNTER — Ambulatory Visit: Payer: Medicare HMO | Admitting: Oncology

## 2019-03-29 ENCOUNTER — Ambulatory Visit: Payer: Medicare HMO

## 2019-03-29 ENCOUNTER — Other Ambulatory Visit: Payer: Medicare HMO

## 2019-04-05 ENCOUNTER — Inpatient Hospital Stay: Payer: PPO

## 2019-04-05 ENCOUNTER — Inpatient Hospital Stay (HOSPITAL_BASED_OUTPATIENT_CLINIC_OR_DEPARTMENT_OTHER): Payer: PPO | Admitting: Oncology

## 2019-04-05 ENCOUNTER — Encounter: Payer: Self-pay | Admitting: Oncology

## 2019-04-05 ENCOUNTER — Other Ambulatory Visit: Payer: Self-pay

## 2019-04-05 VITALS — BP 125/82 | HR 93 | Temp 97.5°F | Wt 200.0 lb

## 2019-04-05 DIAGNOSIS — C669 Malignant neoplasm of unspecified ureter: Secondary | ICD-10-CM

## 2019-04-05 DIAGNOSIS — Z5112 Encounter for antineoplastic immunotherapy: Secondary | ICD-10-CM | POA: Diagnosis not present

## 2019-04-05 DIAGNOSIS — Z5111 Encounter for antineoplastic chemotherapy: Secondary | ICD-10-CM | POA: Diagnosis not present

## 2019-04-05 DIAGNOSIS — Z95828 Presence of other vascular implants and grafts: Secondary | ICD-10-CM

## 2019-04-05 LAB — CBC WITH DIFFERENTIAL/PLATELET
Abs Immature Granulocytes: 0.03 10*3/uL (ref 0.00–0.07)
Basophils Absolute: 0.1 10*3/uL (ref 0.0–0.1)
Basophils Relative: 1 %
Eosinophils Absolute: 0.2 10*3/uL (ref 0.0–0.5)
Eosinophils Relative: 4 %
HCT: 37.3 % — ABNORMAL LOW (ref 39.0–52.0)
Hemoglobin: 12.5 g/dL — ABNORMAL LOW (ref 13.0–17.0)
Immature Granulocytes: 1 %
Lymphocytes Relative: 24 %
Lymphs Abs: 1.2 10*3/uL (ref 0.7–4.0)
MCH: 29.9 pg (ref 26.0–34.0)
MCHC: 33.5 g/dL (ref 30.0–36.0)
MCV: 89.2 fL (ref 80.0–100.0)
Monocytes Absolute: 0.7 10*3/uL (ref 0.1–1.0)
Monocytes Relative: 13 %
Neutro Abs: 2.9 10*3/uL (ref 1.7–7.7)
Neutrophils Relative %: 57 %
Platelets: 314 10*3/uL (ref 150–400)
RBC: 4.18 MIL/uL — ABNORMAL LOW (ref 4.22–5.81)
RDW: 15 % (ref 11.5–15.5)
WBC: 5.1 10*3/uL (ref 4.0–10.5)
nRBC: 0 % (ref 0.0–0.2)

## 2019-04-05 LAB — COMPREHENSIVE METABOLIC PANEL
ALT: 22 U/L (ref 0–44)
AST: 34 U/L (ref 15–41)
Albumin: 3.5 g/dL (ref 3.5–5.0)
Alkaline Phosphatase: 38 U/L (ref 38–126)
Anion gap: 9 (ref 5–15)
BUN: 17 mg/dL (ref 8–23)
CO2: 27 mmol/L (ref 22–32)
Calcium: 8.8 mg/dL — ABNORMAL LOW (ref 8.9–10.3)
Chloride: 97 mmol/L — ABNORMAL LOW (ref 98–111)
Creatinine, Ser: 1.19 mg/dL (ref 0.61–1.24)
GFR calc Af Amer: >60 mL/min (ref >60)
GFR calc non Af Amer: 55 mL/min — ABNORMAL LOW (ref >60)
Glucose, Bld: 146 mg/dL — ABNORMAL HIGH (ref 70–99)
Potassium: 3.7 mmol/L (ref 3.5–5.1)
Sodium: 133 mmol/L — ABNORMAL LOW (ref 135–145)
Total Bilirubin: 0.6 mg/dL (ref 0.3–1.2)
Total Protein: 6 g/dL — ABNORMAL LOW (ref 6.5–8.1)

## 2019-04-05 MED ORDER — SODIUM CHLORIDE 0.9 % IV SOLN
90.0000 mg | Freq: Once | INTRAVENOUS | Status: AC
  Administered 2019-04-05: 90 mg via INTRAVENOUS
  Filled 2019-04-05: qty 9

## 2019-04-05 MED ORDER — HEPARIN SOD (PORK) LOCK FLUSH 100 UNIT/ML IV SOLN
500.0000 [IU] | Freq: Once | INTRAVENOUS | Status: AC | PRN
  Administered 2019-04-05: 500 [IU]
  Filled 2019-04-05: qty 5

## 2019-04-05 MED ORDER — PALONOSETRON HCL INJECTION 0.25 MG/5ML
0.2500 mg | Freq: Once | INTRAVENOUS | Status: AC
  Administered 2019-04-05: 0.25 mg via INTRAVENOUS
  Filled 2019-04-05: qty 5

## 2019-04-05 MED ORDER — SODIUM CHLORIDE 0.9% FLUSH
10.0000 mL | Freq: Once | INTRAVENOUS | Status: AC
  Administered 2019-04-05: 10 mL via INTRAVENOUS
  Filled 2019-04-05: qty 10

## 2019-04-05 MED ORDER — SODIUM CHLORIDE 0.9 % IV SOLN
10.0000 mg | Freq: Once | INTRAVENOUS | Status: AC
  Administered 2019-04-05: 10 mg via INTRAVENOUS
  Filled 2019-04-05: qty 1

## 2019-04-05 MED ORDER — SODIUM CHLORIDE 0.9 % IV SOLN
Freq: Once | INTRAVENOUS | Status: AC
  Filled 2019-04-05: qty 250

## 2019-04-05 NOTE — Progress Notes (Signed)
Patient stated that in the mornings he wakes up feeling dizzy but then during the day he feels good.

## 2019-04-06 ENCOUNTER — Telehealth: Payer: Self-pay | Admitting: *Deleted

## 2019-04-06 NOTE — Telephone Encounter (Signed)
Patient called with questions regarding hsi appts. I called and spoke to him and clarified his appts with him. He said he understands now

## 2019-04-07 NOTE — Progress Notes (Signed)
Hematology/Oncology Consult note [facility]  [phone] [phone]  Patient Care Team: Idelle Crouch, MD as PCP - General (Internal Medicine) Sindy Guadeloupe, MD as Consulting Physician (Hematology and Oncology)   Name of the patient: Anthony Gonzales  [id number]  1931-08-10   Date of visit: 04/07/19  Diagnosis- Metastatic urothelial carcinoma with possible mets to the obturator node. Indeterminate hilar lymph nodes and LUL lung nodule   Chief complaint/ Reason for visit- on treatment assessment prior to cycle 3 day 1 of padcev  Heme/Onc history: patient is a 86 year old male with past medical history significant for hypertension and long-standing history of superficial bladder cancer for which he sees [name]. He has undergone TURBT as well as ureteroscopy since 2017 along with mitomycin as well in the past. Most recently he underwent CT abdomen on 08/06/2017 which showed a soft tissue mass in the right renal pelvis concerning for upper tract urothelial neoplasm. Soft tissue fullness at the right ureterovesical junction with proximal right hydroureteronephrosis. Several millimeter attenuation lesion in the pancreatic head.  He underwent diagnostic ureteroscopy and was found to have 2 high-grade lesions within his right kidney. There was a nodular high-grade appearing lesion in the right anterior renal pelvis. There was also a second ureteral tumor fungating from the right ureteral orifice extending into the distal ureter also consistent with high-grade invasive urothelial carcinoma. Muscle invasion could not be assessed. He also underwent a CT chest which showed a rounded nodule in the right upper lobe measuring 14 mm concerning for metastases. 2 other 3 mm lesions were also noted in the left upper lobe likely benign  Plan initially was neoadjuvant chemotherapy followed by possible surgery but given the presence of lung  lesion patient has been referred to oncology for the same.  PET/CT on 09/21/17 showed:IMPRESSION: 1. Hypermetabolic right obturator lymph node is most indicative of metastatic disease. 2. Mildly hypermetabolic left hilar lymph nodes are nonspecific. Continued attention on follow-up exams is warranted. 3. Right upper lobe pulmonary nodule shows metabolism after just above blood pool and is therefore indeterminate. Continued attention on follow-up exams is warranted. 4. Aortic atherosclerosis (ICD10-170.0). Coronary artery calcification. 5. Cholelithiasis  Carboplatin/ gemzar1 week on and one-week off as patient could not tolerate 2-week on and one week off regimen. Cycle 1 started on 09/22/2017 Disease progression in March 2020. Switched to second [employer]  Patient had disease controlled with Keytruda. However he was noted to have worsening dermatitis with constant flareups requiring steroids. Beryle Flock is therefore being kept on hold and patient will be switched to third line Padcev   Interval history- skin rash mainly present over the trunk. Kenalog has been helping him. No other areas of progressive rash  ECOG PS- 1 Pain scale- 0   Review of systems- Review of Systems  Constitutional: Negative for chills, fever, malaise/fatigue and weight loss.  HENT: Negative for congestion, ear discharge and nosebleeds.   Eyes: Negative for blurred vision.  Respiratory: Negative for cough, hemoptysis, sputum production, shortness of breath and wheezing.   Cardiovascular: Negative for chest pain, palpitations, orthopnea and claudication.  Gastrointestinal: Negative for abdominal pain, blood in stool, constipation, diarrhea, heartburn, melena, nausea and vomiting.  Genitourinary: Negative for dysuria, flank pain, frequency, hematuria and urgency.  Musculoskeletal: Negative for back pain, joint pain and myalgias.  Skin: Positive for rash.  Neurological: Negative for dizziness,  tingling, focal weakness, seizures, weakness and headaches.  Endo/Heme/Allergies: Does not bruise/bleed easily.  Psychiatric/Behavioral: Negative for  depression and suicidal ideas. The patient does not have insomnia.       Allergies  Allergen Reactions  . Demerol [Meperidine] Nausea And Vomiting  . Lipitor [Atorvastatin] Swelling  . Sulfa Antibiotics Nausea And Vomiting and Rash     Past Medical History:  Diagnosis Date  . Arthritis   . Benign fibroma of prostate 08/23/2013  . BPH (benign prostatic hyperplasia)   . Calculus of kidney 08/23/2013  . Glaucoma    no drops in 3 mo pressure good, pt denies glaucoma, eye pressure has been measuring alright.  . History of kidney stones   . HLD (hyperlipidemia)   . HOH (hard of hearing)    Left Hearing Aid  . HTN (hypertension) 12/26/2014  . Hypertension   . Hyponatremia 12/26/2014  . Migraines    history of migraines when he was younger.  [name] Restless leg syndrome   . Sinus drainage   . Skin cancer   . Urothelial cancer ([place])    chemo tx's.  [name] UTI (lower urinary tract infection) 12/26/2014  . Vertigo      Past Surgical History:  Procedure Laterality Date  . COLONOSCOPY    . CYSTOSCOPY W/ RETROGRADES Right 01/30/2015   Procedure: CYSTOSCOPY WITH RETROGRADE PYELOGRAM;  Surgeon: [name], MD;  Location: [facility];  Service: Urology;  Laterality: Right;  . CYSTOSCOPY W/ RETROGRADES Bilateral 02/26/2016   Procedure: CYSTOSCOPY WITH RETROGRADE PYELOGRAM;  Surgeon: [name], MD;  Location: [facility];  Service: Urology;  Laterality: Bilateral;  . CYSTOSCOPY W/ URETERAL STENT PLACEMENT Right 08/20/2015   Procedure: CYSTOSCOPY WITH RETROGRADE PYELOGRAM/POSSIBLE URETERAL STENT PLACEMENT/BLADDER BIOPSY;  Surgeon: [name], MD;  Location: [facility];  Service: Urology;  Laterality: Right;  . CYSTOSCOPY W/ URETERAL STENT PLACEMENT Right 09/12/2015   Procedure: CYSTOSCOPY WITH STENT REPLACEMENT;  Surgeon: [name], MD;   Location: [facility];  Service: Urology;  Laterality: Right;  . CYSTOSCOPY WITH BIOPSY Right 09/12/2015   Procedure: CYSTOSCOPY WITH BLADDER AND URETERAL BIOPSY;  Surgeon: [name], MD;  Location: [facility];  Service: Urology;  Laterality: Right;  . CYSTOSCOPY WITH STENT PLACEMENT Right 01/30/2015   Procedure: CYSTOSCOPY WITH STENT PLACEMENT;  Surgeon: [name], MD;  Location: [facility];  Service: Urology;  Laterality: Right;  . CYSTOSCOPY WITH STENT PLACEMENT Right 12/28/2017   Procedure: [place] WITH STENT Exchange;  Surgeon: [name], MD;  Location: [facility];  Service: Urology;  Laterality: Right;  . CYSTOSCOPY/URETEROSCOPY/HOLMIUM LASER/STENT PLACEMENT Right 08/19/2017   Procedure: CYSTOSCOPY/URETEROSCOPY/HOLMIUM LASER/STENT PLACEMENT;  Surgeon: [name], MD;  Location: [facility];  Service: Urology;  Laterality: Right;  . EYE SURGERY Bilateral    Cataract Extraction with IOL  . goiter removal    . HOLMIUM LASER APPLICATION N/A 5/85/2778   Procedure:  HOLMIUM LASER APPLICATION;  Surgeon: [name], MD;  Location: [facility];  Service: Urology;  Laterality: N/A;  . PORTA CATH INSERTION N/A 09/23/2017   Procedure: PORTA CATH INSERTION;  Surgeon: Algernon Huxley, MD;  Location: [facility];  Service: Cardiovascular;  Laterality: N/A;  . SPERMATOCELECTOMY    . TONSILLECTOMY    . TRANSURETHRAL RESECTION OF BLADDER TUMOR N/A 02/26/2016   Procedure: TRANSURETHRAL RESECTION OF BLADDER TUMOR (TURBT);  Surgeon: [name], MD;  Location: [facility];  Service: Urology;  Laterality: N/A;  . TRANSURETHRAL RESECTION OF BLADDER TUMOR WITH MITOMYCIN-C N/A 09/12/2015   Procedure: TRANSURETHRAL RESECTION OF BLADDER TUMOR ;  Surgeon: [name], MD;  Location: [facility];  Service: Urology;  Laterality:  N/A;  . TRANSURETHRAL RESECTION OF BLADDER TUMOR WITH MITOMYCIN-C N/A 03/24/2016   Procedure: TRANSURETHRAL RESECTION OF BLADDER TUMOR WITH MITOMYCIN-C  (SMALL);  Surgeon: [name], MD;  Location: [facility];  Service: Urology;  Laterality: N/A;  . URETERAL BIOPSY Right 08/19/2017   Procedure: Renal Mass BIOPSY;  Surgeon: [name], MD;  Location: [facility];  Service: Urology;  Laterality: Right;  . URETEROSCOPY Right 01/30/2015   Procedure: URETEROSCOPY/ WITH BIOPSY AND CYTOLOGY BRUSHING;  Surgeon: [name], MD;  Location: [facility];  Service: Urology;  Laterality: Right;  . URETEROSCOPY Right 08/20/2015   Procedure: URETEROSCOPY;  Surgeon: [name], MD;  Location: [facility];  Service: Urology;  Laterality: Right;  . URETEROSCOPY Right 09/12/2015   Procedure: URETEROSCOPY;  Surgeon: [name], MD;  Location: [facility];  Service: Urology;  Laterality: Right;  . URETEROSCOPY Right 02/26/2016   Procedure: URETEROSCOPY;  Surgeon: [name], MD;  Location: [facility];  Service: Urology;  Laterality: Right;    Social History   Socioeconomic History  . Marital status: Married    Spouse name: Not on file  . Number of children: Not on file  . Years of education: Not on file  . Highest education level: Not on file  Occupational History  . Not on file  Tobacco Use  . Smoking status: Never Smoker  . Smokeless tobacco: Never Used  Substance and Sexual Activity  . Alcohol use: No    Alcohol/week: 0.0 standard drinks  . Drug use: No  . Sexual activity: Not on file  Other Topics Concern  . Not on file  Social History Narrative  . Not on file   Social Determinants of Health   Financial Resource Strain:   . Difficulty of Paying Living Expenses: Not on file  Food Insecurity:   . Worried About Charity fundraiser in the Last Year: Not on file  . Ran Out of Food in the Last Year: Not on file  Transportation Needs:   . Lack of Transportation (Medical): Not on file  . Lack of Transportation (Non-Medical): Not on file  Physical Activity:   . Days of Exercise per Week: Not on file  . Minutes of Exercise per Session: Not on file  Stress:   .  Feeling of Stress : Not on file  Social Connections:   . Frequency of Communication with Friends and Family: Not on file  . Frequency of Social Gatherings with Friends and Family: Not on file  . Attends Religious Services: Not on file  . Active Member of Clubs or Organizations: Not on file  . Attends Archivist Meetings: Not on file  . Marital Status: Not on file  Intimate Partner Violence:   . Fear of Current or Ex-Partner: Not on file  . Emotionally Abused: Not on file  . Physically Abused: Not on file  . Sexually Abused: Not on file    Family History  Problem Relation Age of Onset  . Hypertension Mother   . Hypertension Father   . Prostate cancer Brother   . Kidney disease Neg Hx   . Kidney cancer Neg Hx   . Bladder Cancer Neg Hx      Current Outpatient Medications:  .  acetaminophen (TYLENOL) 500 MG tablet, Take 1,000 mg by mouth daily as needed for moderate pain. , Disp: , Rfl:  .  amLODipine (NORVASC) 5 MG tablet, Take 5 mg by mouth daily. , Disp: , Rfl:  .  benazepril-hydrochlorthiazide (LOTENSIN HCT)  20-12.5 MG tablet, Take 1 tablet by mouth daily. , Disp: , Rfl:  .  cetirizine (ZYRTEC) 10 MG tablet, Take 10 mg by mouth daily., Disp: , Rfl:  .  diazepam (VALIUM) 5 MG tablet, TAKE 1 TABLET (5 MG TOTAL) BY MOUTH EVERY 12 (TWELVE) HOURS AS NEEDED FOR ANXIETY OR SLEEP, Disp: , Rfl:  .  docusate sodium (COLACE) 100 MG capsule, Take 100 mg by mouth 2 (two) times daily. , Disp: , Rfl:  .  fenofibrate micronized (LOFIBRA) 134 MG capsule, Take 134 mg by mouth daily before breakfast. , Disp: , Rfl:  .  fluticasone (FLONASE) 50 MCG/ACT nasal spray, Place 2 sprays into both nostrils at bedtime., Disp: , Rfl:  .  HYDROcodone-acetaminophen (NORCO/VICODIN) 5-325 MG tablet, Take 1 tablet by mouth every 6 (six) hours as needed for severe pain., Disp: 30 tablet, Rfl: 0 .  hydrocortisone cream 1 %, Apply 1 application topically daily as needed for itching., Disp: 30 g, Rfl: 1 .   mirabegron ER (MYRBETRIQ) 50 MG TB24 tablet, Take 1 tablet (50 mg total) by mouth daily., Disp: 30 tablet, Rfl: 11 .  Multiple Vitamin (MULTIVITAMIN WITH MINERALS) TABS tablet, Take 1 tablet by mouth daily., Disp: , Rfl:  .  nystatin (MYCOSTATIN/NYSTOP) powder, APPLY TO AFFECTED AREA 4 TIMES A DAY, Disp: 15 g, Rfl: 0 .  Potassium 99 MG TABS, Take 99 mg by mouth at bedtime., Disp: , Rfl:  .  psyllium (METAMUCIL) 58.6 % packet, Take 1 packet by mouth daily. , Disp: , Rfl:  .  tamsulosin (FLOMAX) 0.4 MG CAPS capsule, Take 1 capsule (0.4 mg total) by mouth daily., Disp: 90 capsule, Rfl: 3 .  triamcinolone cream (KENALOG) 0.1 %, Apply 1 application topically 2 (two) times daily as needed., Disp: , Rfl:   Physical exam:  Vitals:   04/05/19 0913  BP: 125/82  Pulse: 93  Temp: (!) 97.5 F (36.4 C)  TempSrc: Tympanic  Weight: 200 lb (90.7 kg)   Physical Exam Constitutional:      General: He is not in acute distress. HENT:     Head: Normocephalic and atraumatic.  Eyes:     Pupils: Pupils are equal, round, and reactive to light.  Cardiovascular:     Rate and Rhythm: Normal rate and regular rhythm.     Heart sounds: Normal heart sounds.  Pulmonary:     Effort: Pulmonary effort is normal.     Breath sounds: Normal breath sounds.  Abdominal:     General: Bowel sounds are normal.     Palpations: Abdomen is soft.  Musculoskeletal:     Cervical back: Normal range of motion.  Skin:    General: Skin is warm and dry.     Comments: Maculopapular rash mainly over anterior abdominal wall as in the picture  Neurological:     Mental Status: He is alert and oriented to person, place, and time.      CMP Latest Ref Rng & Units 04/05/2019  Glucose 70 - 99 mg/dL 146(H)  BUN 8 - 23 mg/dL 17  Creatinine 0.61 - 1.24 mg/dL 1.19  Sodium 135 - 145 mmol/L 133(L)  Potassium 3.5 - 5.1 mmol/L 3.7  Chloride 98 - 111 mmol/L 97(L)  CO2 22 - 32 mmol/L 27  Calcium 8.9 - 10.3 mg/dL 8.8(L)  Total Protein 6.5 -  8.1 g/dL 6.0(L)  Total Bilirubin 0.3 - 1.2 mg/dL 0.6  Alkaline Phos 38 - 126 U/L 38  AST 15 - 41 U/L 34  ALT 0 - 44 U/L 22   CBC Latest Ref Rng & Units 04/05/2019  WBC 4.0 - 10.5 K/uL 5.1  Hemoglobin 13.0 - 17.0 g/dL 12.5(L)  Hematocrit 39.0 - 52.0 % 37.3(L)  Platelets 150 - 400 K/uL 314         Assessment and plan- Patient is a 86 y.o. male with metastatic urothelial carcinoma with metastases to the lymph nodes.He is here for on treatment assessment prior to cycle 3 day 1 of padcev  Counts ok to proceed with cycle 3 day 1 of padcev today. He is getting reduced dose at 1 mg/meter square and getting it 2 weeks on 1 week off regimen. I will see him back in 3 weeks for cycle 4 day 1. Scans after cycle 4. He will get cycle 3 day 8 next week  Skin rash- milder but persistent despite stopping [place]. Patient also sees derm. On topical kenalog   Visit Diagnosis 1. Encounter for antineoplastic chemotherapy   2. 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 Randa Evens, MD, MPH [facility] at [facility] [phone] 04/07/2019 12:31 PM

## 2019-04-12 ENCOUNTER — Other Ambulatory Visit: Payer: Self-pay

## 2019-04-12 ENCOUNTER — Inpatient Hospital Stay: Payer: PPO | Attending: Oncology

## 2019-04-12 DIAGNOSIS — R911 Solitary pulmonary nodule: Secondary | ICD-10-CM | POA: Insufficient documentation

## 2019-04-12 DIAGNOSIS — Z5112 Encounter for antineoplastic immunotherapy: Secondary | ICD-10-CM | POA: Diagnosis not present

## 2019-04-12 DIAGNOSIS — K8020 Calculus of gallbladder without cholecystitis without obstruction: Secondary | ICD-10-CM | POA: Diagnosis not present

## 2019-04-12 DIAGNOSIS — I2510 Atherosclerotic heart disease of native coronary artery without angina pectoris: Secondary | ICD-10-CM | POA: Diagnosis not present

## 2019-04-12 DIAGNOSIS — R5383 Other fatigue: Secondary | ICD-10-CM | POA: Insufficient documentation

## 2019-04-12 DIAGNOSIS — Z95828 Presence of other vascular implants and grafts: Secondary | ICD-10-CM

## 2019-04-12 DIAGNOSIS — I700 Atherosclerosis of aorta: Secondary | ICD-10-CM | POA: Insufficient documentation

## 2019-04-12 DIAGNOSIS — Z79899 Other long term (current) drug therapy: Secondary | ICD-10-CM | POA: Diagnosis not present

## 2019-04-12 DIAGNOSIS — C680 Malignant neoplasm of urethra: Secondary | ICD-10-CM | POA: Diagnosis not present

## 2019-04-12 DIAGNOSIS — Z885 Allergy status to narcotic agent status: Secondary | ICD-10-CM | POA: Diagnosis not present

## 2019-04-12 DIAGNOSIS — N400 Enlarged prostate without lower urinary tract symptoms: Secondary | ICD-10-CM | POA: Diagnosis not present

## 2019-04-12 DIAGNOSIS — Z85828 Personal history of other malignant neoplasm of skin: Secondary | ICD-10-CM | POA: Insufficient documentation

## 2019-04-12 DIAGNOSIS — C779 Secondary and unspecified malignant neoplasm of lymph node, unspecified: Secondary | ICD-10-CM | POA: Insufficient documentation

## 2019-04-12 DIAGNOSIS — Z8042 Family history of malignant neoplasm of prostate: Secondary | ICD-10-CM | POA: Insufficient documentation

## 2019-04-12 DIAGNOSIS — Z87442 Personal history of urinary calculi: Secondary | ICD-10-CM | POA: Diagnosis not present

## 2019-04-12 DIAGNOSIS — R21 Rash and other nonspecific skin eruption: Secondary | ICD-10-CM | POA: Diagnosis not present

## 2019-04-12 DIAGNOSIS — Z8249 Family history of ischemic heart disease and other diseases of the circulatory system: Secondary | ICD-10-CM | POA: Diagnosis not present

## 2019-04-12 DIAGNOSIS — Z882 Allergy status to sulfonamides status: Secondary | ICD-10-CM | POA: Diagnosis not present

## 2019-04-12 DIAGNOSIS — C669 Malignant neoplasm of unspecified ureter: Secondary | ICD-10-CM

## 2019-04-12 LAB — COMPREHENSIVE METABOLIC PANEL
ALT: 22 U/L (ref 0–44)
AST: 31 U/L (ref 15–41)
Albumin: 3.6 g/dL (ref 3.5–5.0)
Alkaline Phosphatase: 45 U/L (ref 38–126)
Anion gap: 8 (ref 5–15)
BUN: 20 mg/dL (ref 8–23)
CO2: 27 mmol/L (ref 22–32)
Calcium: 8.9 mg/dL (ref 8.9–10.3)
Chloride: 97 mmol/L — ABNORMAL LOW (ref 98–111)
Creatinine, Ser: 1.15 mg/dL (ref 0.61–1.24)
GFR calc Af Amer: >60 mL/min (ref >60)
GFR calc non Af Amer: 58 mL/min — ABNORMAL LOW (ref >60)
Glucose, Bld: 111 mg/dL — ABNORMAL HIGH (ref 70–99)
Potassium: 3.8 mmol/L (ref 3.5–5.1)
Sodium: 132 mmol/L — ABNORMAL LOW (ref 135–145)
Total Bilirubin: 0.6 mg/dL (ref 0.3–1.2)
Total Protein: 6.2 g/dL — ABNORMAL LOW (ref 6.5–8.1)

## 2019-04-12 LAB — CBC WITH DIFFERENTIAL/PLATELET
Abs Immature Granulocytes: 0.03 10*3/uL (ref 0.00–0.07)
Basophils Absolute: 0.1 10*3/uL (ref 0.0–0.1)
Basophils Relative: 1 %
Eosinophils Absolute: 0.2 10*3/uL (ref 0.0–0.5)
Eosinophils Relative: 2 %
HCT: 38.7 % — ABNORMAL LOW (ref 39.0–52.0)
Hemoglobin: 12.8 g/dL — ABNORMAL LOW (ref 13.0–17.0)
Immature Granulocytes: 0 %
Lymphocytes Relative: 27 %
Lymphs Abs: 1.9 10*3/uL (ref 0.7–4.0)
MCH: 30 pg (ref 26.0–34.0)
MCHC: 33.1 g/dL (ref 30.0–36.0)
MCV: 90.8 fL (ref 80.0–100.0)
Monocytes Absolute: 1 10*3/uL (ref 0.1–1.0)
Monocytes Relative: 14 %
Neutro Abs: 4 10*3/uL (ref 1.7–7.7)
Neutrophils Relative %: 56 %
Platelets: 275 10*3/uL (ref 150–400)
RBC: 4.26 MIL/uL (ref 4.22–5.81)
RDW: 15.4 % (ref 11.5–15.5)
WBC: 7.2 10*3/uL (ref 4.0–10.5)
nRBC: 0 % (ref 0.0–0.2)

## 2019-04-12 MED ORDER — SODIUM CHLORIDE 0.9% FLUSH
10.0000 mL | Freq: Once | INTRAVENOUS | Status: AC
  Filled 2019-04-12: qty 10

## 2019-04-26 ENCOUNTER — Encounter: Payer: Self-pay | Admitting: Oncology

## 2019-04-26 ENCOUNTER — Other Ambulatory Visit: Payer: Self-pay

## 2019-04-26 ENCOUNTER — Inpatient Hospital Stay (HOSPITAL_BASED_OUTPATIENT_CLINIC_OR_DEPARTMENT_OTHER): Payer: PPO | Admitting: Oncology

## 2019-04-26 ENCOUNTER — Inpatient Hospital Stay: Payer: PPO

## 2019-04-26 VITALS — BP 131/74 | HR 78 | Temp 97.0°F | Wt 199.6 lb

## 2019-04-26 DIAGNOSIS — Z5111 Encounter for antineoplastic chemotherapy: Secondary | ICD-10-CM | POA: Diagnosis not present

## 2019-04-26 DIAGNOSIS — R21 Rash and other nonspecific skin eruption: Secondary | ICD-10-CM

## 2019-04-26 DIAGNOSIS — Z5112 Encounter for antineoplastic immunotherapy: Secondary | ICD-10-CM | POA: Diagnosis not present

## 2019-04-26 DIAGNOSIS — C669 Malignant neoplasm of unspecified ureter: Secondary | ICD-10-CM | POA: Diagnosis not present

## 2019-04-26 DIAGNOSIS — Z95828 Presence of other vascular implants and grafts: Secondary | ICD-10-CM

## 2019-04-26 LAB — COMPREHENSIVE METABOLIC PANEL
ALT: 19 U/L (ref 0–44)
AST: 28 U/L (ref 15–41)
Albumin: 3.5 g/dL (ref 3.5–5.0)
Alkaline Phosphatase: 44 U/L (ref 38–126)
Anion gap: 8 (ref 5–15)
BUN: 21 mg/dL (ref 8–23)
CO2: 27 mmol/L (ref 22–32)
Calcium: 8.8 mg/dL — ABNORMAL LOW (ref 8.9–10.3)
Chloride: 99 mmol/L (ref 98–111)
Creatinine, Ser: 1.38 mg/dL — ABNORMAL HIGH (ref 0.61–1.24)
GFR calc Af Amer: 54 mL/min — ABNORMAL LOW (ref >60)
GFR calc non Af Amer: 46 mL/min — ABNORMAL LOW (ref >60)
Glucose, Bld: 130 mg/dL — ABNORMAL HIGH (ref 70–99)
Potassium: 3.8 mmol/L (ref 3.5–5.1)
Sodium: 134 mmol/L — ABNORMAL LOW (ref 135–145)
Total Bilirubin: 0.5 mg/dL (ref 0.3–1.2)
Total Protein: 6.2 g/dL — ABNORMAL LOW (ref 6.5–8.1)

## 2019-04-26 LAB — CBC WITH DIFFERENTIAL/PLATELET
Abs Immature Granulocytes: 0.02 10*3/uL (ref 0.00–0.07)
Basophils Absolute: 0.1 10*3/uL (ref 0.0–0.1)
Basophils Relative: 1 %
Eosinophils Absolute: 0.7 10*3/uL — ABNORMAL HIGH (ref 0.0–0.5)
Eosinophils Relative: 10 %
HCT: 37.7 % — ABNORMAL LOW (ref 39.0–52.0)
Hemoglobin: 12.7 g/dL — ABNORMAL LOW (ref 13.0–17.0)
Immature Granulocytes: 0 %
Lymphocytes Relative: 19 %
Lymphs Abs: 1.5 10*3/uL (ref 0.7–4.0)
MCH: 30.4 pg (ref 26.0–34.0)
MCHC: 33.7 g/dL (ref 30.0–36.0)
MCV: 90.2 fL (ref 80.0–100.0)
Monocytes Absolute: 0.7 10*3/uL (ref 0.1–1.0)
Monocytes Relative: 9 %
Neutro Abs: 4.8 10*3/uL (ref 1.7–7.7)
Neutrophils Relative %: 61 %
Platelets: 255 10*3/uL (ref 150–400)
RBC: 4.18 MIL/uL — ABNORMAL LOW (ref 4.22–5.81)
RDW: 15.5 % (ref 11.5–15.5)
WBC: 7.8 10*3/uL (ref 4.0–10.5)
nRBC: 0 % (ref 0.0–0.2)

## 2019-04-26 MED ORDER — SODIUM CHLORIDE 0.9 % IV SOLN
10.0000 mg | Freq: Once | INTRAVENOUS | Status: AC
  Administered 2019-04-26: 14:00:00 10 mg via INTRAVENOUS
  Filled 2019-04-26: qty 1

## 2019-04-26 MED ORDER — SODIUM CHLORIDE 0.9 % IV SOLN
Freq: Once | INTRAVENOUS | Status: AC
  Filled 2019-04-26: qty 250

## 2019-04-26 MED ORDER — PALONOSETRON HCL INJECTION 0.25 MG/5ML
0.2500 mg | Freq: Once | INTRAVENOUS | Status: AC
  Administered 2019-04-26: 0.25 mg via INTRAVENOUS
  Filled 2019-04-26: qty 5

## 2019-04-26 MED ORDER — SODIUM CHLORIDE 0.9 % IV SOLN
90.0000 mg | Freq: Once | INTRAVENOUS | Status: AC
  Administered 2019-04-26: 15:00:00 90 mg via INTRAVENOUS
  Filled 2019-04-26: qty 9

## 2019-04-26 MED ORDER — SODIUM CHLORIDE 0.9% FLUSH
10.0000 mL | Freq: Once | INTRAVENOUS | Status: AC
  Administered 2019-04-26: 13:00:00 10 mL via INTRAVENOUS
  Filled 2019-04-26: qty 10

## 2019-04-26 MED ORDER — HEPARIN SOD (PORK) LOCK FLUSH 100 UNIT/ML IV SOLN
500.0000 [IU] | Freq: Once | INTRAVENOUS | Status: AC | PRN
  Administered 2019-04-26: 500 [IU]
  Filled 2019-04-26: qty 5

## 2019-04-26 MED ORDER — HEPARIN SOD (PORK) LOCK FLUSH 100 UNIT/ML IV SOLN
INTRAVENOUS | Status: AC
  Filled 2019-04-26: qty 5

## 2019-04-26 NOTE — Progress Notes (Signed)
Pt states that abdomen rash almost gone but it is on his arms. Dr Evorn Gong gave him a jug of cream that is kenalog cream but 0.2 %. He has noticed that it is hard for him to have bm after chemo. He takes colace bid, metamucil every day. He said last treatment on tues it took him Sunday to have BM and his wife gave him dulcolax pills and he took 3.

## 2019-04-28 NOTE — Progress Notes (Signed)
Hematology/Oncology Consult note [facility]  [phone] [phone]  Patient Care Team: Idelle Crouch, MD as PCP - General (Internal Medicine) Sindy Guadeloupe, MD as Consulting Physician (Hematology and Oncology)   Name of the patient: Anthony Gonzales  [id number]  Jun 20, 1931   Date of visit: 04/28/19  Diagnosis- Metastatic urothelial carcinoma with possible mets to the obturator node. Indeterminate hilar lymph nodes and LUL lung nodule  Chief complaint/ Reason for visit- on treatment assessment prior to cycle 4 day 1 of padcev  Heme/Onc history: patient is a 86 year old male with past medical history significant for hypertension and long-standing history of superficial bladder cancer for which he sees [name]. He has undergone TURBT as well as ureteroscopy since 2017 along with mitomycin as well in the past. Most recently he underwent CT abdomen on 08/06/2017 which showed a soft tissue mass in the right renal pelvis concerning for upper tract urothelial neoplasm. Soft tissue fullness at the right ureterovesical junction with proximal right hydroureteronephrosis. Several millimeter attenuation lesion in the pancreatic head.  He underwent diagnostic ureteroscopy and was found to have 2 high-grade lesions within his right kidney. There was a nodular high-grade appearing lesion in the right anterior renal pelvis. There was also a second ureteral tumor fungating from the right ureteral orifice extending into the distal ureter also consistent with high-grade invasive urothelial carcinoma. Muscle invasion could not be assessed. He also underwent a CT chest which showed a rounded nodule in the right upper lobe measuring 14 mm concerning for metastases. 2 other 3 mm lesions were also noted in the left upper lobe likely benign  Plan initially was neoadjuvant chemotherapy followed by possible surgery but given the presence of lung lesion  patient has been referred to oncology for the same.  PET/CT on 09/21/17 showed:IMPRESSION: 1. Hypermetabolic right obturator lymph node is most indicative of metastatic disease. 2. Mildly hypermetabolic left hilar lymph nodes are nonspecific. Continued attention on follow-up exams is warranted. 3. Right upper lobe pulmonary nodule shows metabolism after just above blood pool and is therefore indeterminate. Continued attention on follow-up exams is warranted. 4. Aortic atherosclerosis (ICD10-170.0). Coronary artery calcification. 5. Cholelithiasis  Carboplatin/ gemzar1 week on and one-week off as patient could not tolerate 2-week on and one week off regimen. Cycle 1 started on 09/22/2017 Disease progression in March 2020. Switched to second [employer]  Patient had disease controlled with Keytruda. However he was noted to have worsening dermatitis with constant flareups requiring steroids. Beryle Flock is therefore being kept on hold and patient will be switched to third line Padcev   Interval history- sill gets on and off rash now more prominent on his arms and less so on his torso. He has been using topical steroids with some benefit. Reports fatigue.  ECOG PS- 1 Pain scale- 0   Review of systems- Review of Systems  Constitutional: Negative for chills, fever, malaise/fatigue and weight loss.  HENT: Negative for congestion, ear discharge and nosebleeds.   Eyes: Negative for blurred vision.  Respiratory: Negative for cough, hemoptysis, sputum production, shortness of breath and wheezing.   Cardiovascular: Negative for chest pain, palpitations, orthopnea and claudication.  Gastrointestinal: Negative for abdominal pain, blood in stool, constipation, diarrhea, heartburn, melena, nausea and vomiting.  Genitourinary: Negative for dysuria, flank pain, frequency, hematuria and urgency.  Musculoskeletal: Negative for back pain, joint pain and myalgias.  Skin: Positive for rash.    Neurological: Negative for dizziness, tingling, focal weakness, seizures, weakness and  headaches.  Endo/Heme/Allergies: Does not bruise/bleed easily.  Psychiatric/Behavioral: Negative for depression and suicidal ideas. The patient does not have insomnia.        Allergies  Allergen Reactions  . Demerol [Meperidine] Nausea And Vomiting  . Lipitor [Atorvastatin] Swelling  . Sulfa Antibiotics Nausea And Vomiting and Rash     Past Medical History:  Diagnosis Date  . Arthritis   . Benign fibroma of prostate 08/23/2013  . BPH (benign prostatic hyperplasia)   . Calculus of kidney 08/23/2013  . Glaucoma    no drops in 3 mo pressure good, pt denies glaucoma, eye pressure has been measuring alright.  . History of kidney stones   . HLD (hyperlipidemia)   . HOH (hard of hearing)    Left Hearing Aid  . HTN (hypertension) 12/26/2014  . Hypertension   . Hyponatremia 12/26/2014  . Migraines    history of migraines when he was younger.  [name] Restless leg syndrome   . Sinus drainage   . Skin cancer   . Urothelial cancer ([place])    chemo tx's.  [name] UTI (lower urinary tract infection) 12/26/2014  . Vertigo      Past Surgical History:  Procedure Laterality Date  . COLONOSCOPY    . CYSTOSCOPY W/ RETROGRADES Right 01/30/2015   Procedure: CYSTOSCOPY WITH RETROGRADE PYELOGRAM;  Surgeon: [name], MD;  Location: [facility];  Service: Urology;  Laterality: Right;  . CYSTOSCOPY W/ RETROGRADES Bilateral 02/26/2016   Procedure: CYSTOSCOPY WITH RETROGRADE PYELOGRAM;  Surgeon: [name], MD;  Location: [facility];  Service: Urology;  Laterality: Bilateral;  . CYSTOSCOPY W/ URETERAL STENT PLACEMENT Right 08/20/2015   Procedure: CYSTOSCOPY WITH RETROGRADE PYELOGRAM/POSSIBLE URETERAL STENT PLACEMENT/BLADDER BIOPSY;  Surgeon: [name], MD;  Location: [facility];  Service: Urology;  Laterality: Right;  . CYSTOSCOPY W/ URETERAL STENT PLACEMENT Right 09/12/2015   Procedure: CYSTOSCOPY WITH STENT  REPLACEMENT;  Surgeon: [name], MD;  Location: [facility];  Service: Urology;  Laterality: Right;  . CYSTOSCOPY WITH BIOPSY Right 09/12/2015   Procedure: CYSTOSCOPY WITH BLADDER AND URETERAL BIOPSY;  Surgeon: [name], MD;  Location: [facility];  Service: Urology;  Laterality: Right;  . CYSTOSCOPY WITH STENT PLACEMENT Right 01/30/2015   Procedure: CYSTOSCOPY WITH STENT PLACEMENT;  Surgeon: [name], MD;  Location: [facility];  Service: Urology;  Laterality: Right;  . CYSTOSCOPY WITH STENT PLACEMENT Right 12/28/2017   Procedure: Moody WITH STENT Exchange;  Surgeon: [name], MD;  Location: [facility];  Service: Urology;  Laterality: Right;  . CYSTOSCOPY/URETEROSCOPY/HOLMIUM LASER/STENT PLACEMENT Right 08/19/2017   Procedure: CYSTOSCOPY/URETEROSCOPY/HOLMIUM LASER/STENT PLACEMENT;  Surgeon: [name], MD;  Location: [facility];  Service: Urology;  Laterality: Right;  . EYE SURGERY Bilateral    Cataract Extraction with IOL  . goiter removal    . HOLMIUM LASER APPLICATION N/A 08/27/5091   Procedure:  HOLMIUM LASER APPLICATION;  Surgeon: [name], MD;  Location: [facility];  Service: Urology;  Laterality: N/A;  . PORTA CATH INSERTION N/A 09/23/2017   Procedure: PORTA CATH INSERTION;  Surgeon: Algernon Huxley, MD;  Location: [facility];  Service: Cardiovascular;  Laterality: N/A;  . SPERMATOCELECTOMY    . TONSILLECTOMY    . TRANSURETHRAL RESECTION OF BLADDER TUMOR N/A 02/26/2016   Procedure: TRANSURETHRAL RESECTION OF BLADDER TUMOR (TURBT);  Surgeon: [name], MD;  Location: [facility];  Service: Urology;  Laterality: N/A;  . TRANSURETHRAL RESECTION OF BLADDER TUMOR WITH MITOMYCIN-C N/A 09/12/2015   Procedure: TRANSURETHRAL RESECTION OF BLADDER TUMOR ;  Surgeon:  [name], MD;  Location: [facility];  Service: Urology;  Laterality: N/A;  . TRANSURETHRAL RESECTION OF BLADDER TUMOR WITH MITOMYCIN-C N/A 03/24/2016   Procedure: TRANSURETHRAL RESECTION OF BLADDER TUMOR  WITH MITOMYCIN-C  (SMALL);  Surgeon: [name], MD;  Location: [facility];  Service: Urology;  Laterality: N/A;  . URETERAL BIOPSY Right 08/19/2017   Procedure: Renal Mass BIOPSY;  Surgeon: [name], MD;  Location: [facility];  Service: Urology;  Laterality: Right;  . URETEROSCOPY Right 01/30/2015   Procedure: URETEROSCOPY/ WITH BIOPSY AND CYTOLOGY BRUSHING;  Surgeon: [name], MD;  Location: [facility];  Service: Urology;  Laterality: Right;  . URETEROSCOPY Right 08/20/2015   Procedure: URETEROSCOPY;  Surgeon: [name], MD;  Location: [facility];  Service: Urology;  Laterality: Right;  . URETEROSCOPY Right 09/12/2015   Procedure: URETEROSCOPY;  Surgeon: [name], MD;  Location: [facility];  Service: Urology;  Laterality: Right;  . URETEROSCOPY Right 02/26/2016   Procedure: URETEROSCOPY;  Surgeon: [name], MD;  Location: [facility];  Service: Urology;  Laterality: Right;    Social History   Socioeconomic History  . Marital status: Married    Spouse name: Not on file  . Number of children: Not on file  . Years of education: Not on file  . Highest education level: Not on file  Occupational History  . Not on file  Tobacco Use  . Smoking status: Never Smoker  . Smokeless tobacco: Never Used  Substance and Sexual Activity  . Alcohol use: No    Alcohol/week: 0.0 standard drinks  . Drug use: No  . Sexual activity: Not on file  Other Topics Concern  . Not on file  Social History Narrative  . Not on file   Social Determinants of Health   Financial Resource Strain:   . Difficulty of Paying Living Expenses: Not on file  Food Insecurity:   . Worried About Charity fundraiser in the Last Year: Not on file  . Ran Out of Food in the Last Year: Not on file  Transportation Needs:   . Lack of Transportation (Medical): Not on file  . Lack of Transportation (Non-Medical): Not on file  Physical Activity:   . Days of Exercise per Week: Not on file  . Minutes of Exercise  per Session: Not on file  Stress:   . Feeling of Stress : Not on file  Social Connections:   . Frequency of Communication with Friends and Family: Not on file  . Frequency of Social Gatherings with Friends and Family: Not on file  . Attends Religious Services: Not on file  . Active Member of Clubs or Organizations: Not on file  . Attends Archivist Meetings: Not on file  . Marital Status: Not on file  Intimate Partner Violence:   . Fear of Current or Ex-Partner: Not on file  . Emotionally Abused: Not on file  . Physically Abused: Not on file  . Sexually Abused: Not on file    Family History  Problem Relation Age of Onset  . Hypertension Mother   . Hypertension Father   . Prostate cancer Brother   . Kidney disease Neg Hx   . Kidney cancer Neg Hx   . Bladder Cancer Neg Hx      Current Outpatient Medications:  .  acetaminophen (TYLENOL) 500 MG tablet, Take 1,000 mg by mouth daily as needed for moderate pain. , Disp: , Rfl:  .  amLODipine (NORVASC) 5 MG tablet, Take 5 mg by  mouth daily. , Disp: , Rfl:  .  benazepril-hydrochlorthiazide (LOTENSIN HCT) 20-12.5 MG tablet, Take 1 tablet by mouth daily. , Disp: , Rfl:  .  cetirizine (ZYRTEC) 10 MG tablet, Take 10 mg by mouth daily., Disp: , Rfl:  .  diazepam (VALIUM) 5 MG tablet, TAKE 1 TABLET (5 MG TOTAL) BY MOUTH EVERY 12 (TWELVE) HOURS AS NEEDED FOR ANXIETY OR SLEEP, Disp: , Rfl:  .  docusate sodium (COLACE) 100 MG capsule, Take 100 mg by mouth 2 (two) times daily. , Disp: , Rfl:  .  fenofibrate micronized (LOFIBRA) 134 MG capsule, Take 134 mg by mouth daily before breakfast. , Disp: , Rfl:  .  fluticasone (FLONASE) 50 MCG/ACT nasal spray, Place 2 sprays into both nostrils at bedtime., Disp: , Rfl:  .  HYDROcodone-acetaminophen (NORCO/VICODIN) 5-325 MG tablet, Take 1 tablet by mouth every 6 (six) hours as needed for severe pain., Disp: 30 tablet, Rfl: 0 .  mirabegron ER (MYRBETRIQ) 50 MG TB24 tablet, Take 1 tablet (50 mg  total) by mouth daily., Disp: 30 tablet, Rfl: 11 .  Multiple Vitamin (MULTIVITAMIN WITH MINERALS) TABS tablet, Take 1 tablet by mouth daily., Disp: , Rfl:  .  nystatin (MYCOSTATIN/NYSTOP) powder, APPLY TO AFFECTED AREA 4 TIMES A DAY, Disp: 15 g, Rfl: 0 .  Potassium 99 MG TABS, Take 99 mg by mouth at bedtime., Disp: , Rfl:  .  psyllium (METAMUCIL) 58.6 % packet, Take 1 packet by mouth daily. , Disp: , Rfl:  .  tamsulosin (FLOMAX) 0.4 MG CAPS capsule, Take 1 capsule (0.4 mg total) by mouth daily., Disp: 90 capsule, Rfl: 3 .  TRIAMCINOLONE ACETONIDE EX, Apply 1 application topically 2 (two) times daily. 0.2% and cream made by Dr. Deliah Boston and he sees him on reg. basis, Disp: , Rfl:  No current facility-administered medications for this visit.  Facility-Administered Medications Ordered in Other Visits:  .  sodium chloride flush (NS) 0.9 % injection 10 mL, 10 mL, Intravenous, Once, Sindy Guadeloupe, MD  Physical exam:  Vitals:   04/26/19 1308  BP: 131/74  Pulse: 78  Temp: (!) 97 F (36.1 C)  TempSrc: Tympanic  Weight: 199 lb 9.6 oz (90.5 kg)   Physical Exam HENT:     Head: Normocephalic and atraumatic.  Eyes:     Pupils: Pupils are equal, round, and reactive to light.  Cardiovascular:     Rate and Rhythm: Normal rate and regular rhythm.     Heart sounds: Normal heart sounds.  Pulmonary:     Effort: Pulmonary effort is normal.     Breath sounds: Normal breath sounds.  Abdominal:     General: Bowel sounds are normal.     Palpations: Abdomen is soft.  Musculoskeletal:     Cervical back: Normal range of motion.  Skin:    General: Skin is warm and dry.     Comments: Maculopapular rash over b/l arms  Neurological:     Mental Status: He is alert and oriented to person, place, and time.      CMP Latest Ref Rng & Units 04/26/2019  Glucose 70 - 99 mg/dL 130(H)  BUN 8 - 23 mg/dL 21  Creatinine 0.61 - 1.24 mg/dL 1.38(H)  Sodium 135 - 145 mmol/L 134(L)  Potassium 3.5 - 5.1 mmol/L 3.8    Chloride 98 - 111 mmol/L 99  CO2 22 - 32 mmol/L 27  Calcium 8.9 - 10.3 mg/dL 8.8(L)  Total Protein 6.5 - 8.1 g/dL 6.2(L)  Total Bilirubin 0.3 -  1.2 mg/dL 0.5  Alkaline Phos 38 - 126 U/L 44  AST 15 - 41 U/L 28  ALT 0 - 44 U/L 19   CBC Latest Ref Rng & Units 04/26/2019  WBC 4.0 - 10.5 K/uL 7.8  Hemoglobin 13.0 - 17.0 g/dL 12.7(L)  Hematocrit 39.0 - 52.0 % 37.7(L)  Platelets 150 - 400 K/uL 255    Assessment and plan- Patient is a 86 y.o. male with metastatic urothelial carcinoma with metastases to the lymph nodes. He is here for on treatment assessment prior to cycle 4 day 1of padcev  Counts ok to proceed with cycle 4 day 1 of padcev. He will get cycle 4 day 8 next week and I will see him in 3 weeks for cycle 5.  Ct chest abdomen pelvis with contrast prior  Skin rash- continue topical kenalog   Visit Diagnosis 1. 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 Encounter for antineoplastic chemotherapy   3. Skin rash      Dr. Randa Evens, MD, MPH [facility] [phone] 04/28/2019 1:09 PM

## 2019-05-03 ENCOUNTER — Inpatient Hospital Stay: Payer: PPO

## 2019-05-03 ENCOUNTER — Other Ambulatory Visit: Payer: Self-pay

## 2019-05-03 VITALS — BP 136/76 | HR 73 | Temp 97.3°F | Wt 196.8 lb

## 2019-05-03 DIAGNOSIS — C669 Malignant neoplasm of unspecified ureter: Secondary | ICD-10-CM

## 2019-05-03 DIAGNOSIS — E782 Mixed hyperlipidemia: Secondary | ICD-10-CM | POA: Diagnosis not present

## 2019-05-03 DIAGNOSIS — Z125 Encounter for screening for malignant neoplasm of prostate: Secondary | ICD-10-CM | POA: Diagnosis not present

## 2019-05-03 DIAGNOSIS — Z5112 Encounter for antineoplastic immunotherapy: Secondary | ICD-10-CM | POA: Diagnosis not present

## 2019-05-03 DIAGNOSIS — I10 Essential (primary) hypertension: Secondary | ICD-10-CM | POA: Diagnosis not present

## 2019-05-03 DIAGNOSIS — Z95828 Presence of other vascular implants and grafts: Secondary | ICD-10-CM

## 2019-05-03 DIAGNOSIS — Z79899 Other long term (current) drug therapy: Secondary | ICD-10-CM | POA: Diagnosis not present

## 2019-05-03 DIAGNOSIS — R739 Hyperglycemia, unspecified: Secondary | ICD-10-CM | POA: Diagnosis not present

## 2019-05-03 LAB — CBC WITH DIFFERENTIAL/PLATELET
Abs Immature Granulocytes: 0.02 10*3/uL (ref 0.00–0.07)
Basophils Absolute: 0.1 10*3/uL (ref 0.0–0.1)
Basophils Relative: 1 %
Eosinophils Absolute: 0.7 10*3/uL — ABNORMAL HIGH (ref 0.0–0.5)
Eosinophils Relative: 9 %
HCT: 38 % — ABNORMAL LOW (ref 39.0–52.0)
Hemoglobin: 12.7 g/dL — ABNORMAL LOW (ref 13.0–17.0)
Immature Granulocytes: 0 %
Lymphocytes Relative: 15 %
Lymphs Abs: 1.1 10*3/uL (ref 0.7–4.0)
MCH: 30.3 pg (ref 26.0–34.0)
MCHC: 33.4 g/dL (ref 30.0–36.0)
MCV: 90.7 fL (ref 80.0–100.0)
Monocytes Absolute: 0.9 10*3/uL (ref 0.1–1.0)
Monocytes Relative: 11 %
Neutro Abs: 4.9 10*3/uL (ref 1.7–7.7)
Neutrophils Relative %: 64 %
Platelets: 267 10*3/uL (ref 150–400)
RBC: 4.19 MIL/uL — ABNORMAL LOW (ref 4.22–5.81)
RDW: 15.2 % (ref 11.5–15.5)
WBC: 7.7 10*3/uL (ref 4.0–10.5)
nRBC: 0 % (ref 0.0–0.2)

## 2019-05-03 LAB — COMPREHENSIVE METABOLIC PANEL
ALT: 21 U/L (ref 0–44)
AST: 34 U/L (ref 15–41)
Albumin: 3.6 g/dL (ref 3.5–5.0)
Alkaline Phosphatase: 39 U/L (ref 38–126)
Anion gap: 7 (ref 5–15)
BUN: 19 mg/dL (ref 8–23)
CO2: 29 mmol/L (ref 22–32)
Calcium: 8.9 mg/dL (ref 8.9–10.3)
Chloride: 98 mmol/L (ref 98–111)
Creatinine, Ser: 1.17 mg/dL (ref 0.61–1.24)
GFR calc Af Amer: >60 mL/min (ref >60)
GFR calc non Af Amer: 56 mL/min — ABNORMAL LOW (ref >60)
Glucose, Bld: 119 mg/dL — ABNORMAL HIGH (ref 70–99)
Potassium: 3.9 mmol/L (ref 3.5–5.1)
Sodium: 134 mmol/L — ABNORMAL LOW (ref 135–145)
Total Bilirubin: 0.5 mg/dL (ref 0.3–1.2)
Total Protein: 6.2 g/dL — ABNORMAL LOW (ref 6.5–8.1)

## 2019-05-03 MED ORDER — HEPARIN SOD (PORK) LOCK FLUSH 100 UNIT/ML IV SOLN
500.0000 [IU] | Freq: Once | INTRAVENOUS | Status: AC | PRN
  Administered 2019-05-03: 500 [IU]
  Filled 2019-05-03: qty 5

## 2019-05-03 MED ORDER — HEPARIN SOD (PORK) LOCK FLUSH 100 UNIT/ML IV SOLN
INTRAVENOUS | Status: AC
  Filled 2019-05-03: qty 5

## 2019-05-03 MED ORDER — SODIUM CHLORIDE 0.9% FLUSH
10.0000 mL | Freq: Once | INTRAVENOUS | Status: AC
  Administered 2019-05-03: 14:00:00 10 mL via INTRAVENOUS
  Filled 2019-05-03: qty 10

## 2019-05-03 MED ORDER — SODIUM CHLORIDE 0.9 % IV SOLN
90.0000 mg | Freq: Once | INTRAVENOUS | Status: AC
  Administered 2019-05-03: 90 mg via INTRAVENOUS
  Filled 2019-05-03: qty 9

## 2019-05-03 MED ORDER — PALONOSETRON HCL INJECTION 0.25 MG/5ML
0.2500 mg | Freq: Once | INTRAVENOUS | Status: AC
  Administered 2019-05-03: 0.25 mg via INTRAVENOUS
  Filled 2019-05-03: qty 5

## 2019-05-03 MED ORDER — SODIUM CHLORIDE 0.9 % IV SOLN
10.0000 mg | Freq: Once | INTRAVENOUS | Status: AC
  Administered 2019-05-03: 10 mg via INTRAVENOUS
  Filled 2019-05-03: qty 1

## 2019-05-03 MED ORDER — SODIUM CHLORIDE 0.9 % IV SOLN
Freq: Once | INTRAVENOUS | Status: AC
  Filled 2019-05-03: qty 250

## 2019-05-04 DIAGNOSIS — L270 Generalized skin eruption due to drugs and medicaments taken internally: Secondary | ICD-10-CM | POA: Diagnosis not present

## 2019-05-04 DIAGNOSIS — L570 Actinic keratosis: Secondary | ICD-10-CM | POA: Diagnosis not present

## 2019-05-04 DIAGNOSIS — C44629 Squamous cell carcinoma of skin of left upper limb, including shoulder: Secondary | ICD-10-CM | POA: Diagnosis not present

## 2019-05-04 DIAGNOSIS — X32XXXA Exposure to sunlight, initial encounter: Secondary | ICD-10-CM | POA: Diagnosis not present

## 2019-05-04 DIAGNOSIS — Z08 Encounter for follow-up examination after completed treatment for malignant neoplasm: Secondary | ICD-10-CM | POA: Diagnosis not present

## 2019-05-04 DIAGNOSIS — T490X5A Adverse effect of local antifungal, anti-infective and anti-inflammatory drugs, initial encounter: Secondary | ICD-10-CM | POA: Diagnosis not present

## 2019-05-04 DIAGNOSIS — Z85828 Personal history of other malignant neoplasm of skin: Secondary | ICD-10-CM | POA: Diagnosis not present

## 2019-05-04 DIAGNOSIS — D485 Neoplasm of uncertain behavior of skin: Secondary | ICD-10-CM | POA: Diagnosis not present

## 2019-05-10 DIAGNOSIS — I10 Essential (primary) hypertension: Secondary | ICD-10-CM | POA: Diagnosis not present

## 2019-05-10 DIAGNOSIS — T466X5A Adverse effect of antihyperlipidemic and antiarteriosclerotic drugs, initial encounter: Secondary | ICD-10-CM | POA: Diagnosis not present

## 2019-05-10 DIAGNOSIS — G720 Drug-induced myopathy: Secondary | ICD-10-CM | POA: Diagnosis not present

## 2019-05-10 DIAGNOSIS — C649 Malignant neoplasm of unspecified kidney, except renal pelvis: Secondary | ICD-10-CM | POA: Diagnosis not present

## 2019-05-10 DIAGNOSIS — E782 Mixed hyperlipidemia: Secondary | ICD-10-CM | POA: Diagnosis not present

## 2019-05-10 DIAGNOSIS — R21 Rash and other nonspecific skin eruption: Secondary | ICD-10-CM | POA: Diagnosis not present

## 2019-05-12 ENCOUNTER — Ambulatory Visit
Admission: RE | Admit: 2019-05-12 | Discharge: 2019-05-12 | Disposition: A | Discharge status: Home / Self Care | Payer: PPO | Source: Ambulatory Visit | Attending: Oncology | Admitting: Oncology

## 2019-05-12 ENCOUNTER — Other Ambulatory Visit: Payer: Self-pay

## 2019-05-12 DIAGNOSIS — K5720 Diverticulitis of large intestine with perforation and abscess without bleeding: Secondary | ICD-10-CM | POA: Diagnosis not present

## 2019-05-12 DIAGNOSIS — K869 Disease of pancreas, unspecified: Secondary | ICD-10-CM | POA: Diagnosis not present

## 2019-05-12 DIAGNOSIS — K5730 Diverticulosis of large intestine without perforation or abscess without bleeding: Secondary | ICD-10-CM | POA: Diagnosis not present

## 2019-05-12 DIAGNOSIS — I358 Other nonrheumatic aortic valve disorders: Secondary | ICD-10-CM | POA: Diagnosis not present

## 2019-05-12 DIAGNOSIS — R918 Other nonspecific abnormal finding of lung field: Secondary | ICD-10-CM | POA: Diagnosis not present

## 2019-05-12 DIAGNOSIS — I700 Atherosclerosis of aorta: Secondary | ICD-10-CM | POA: Diagnosis not present

## 2019-05-12 DIAGNOSIS — C669 Malignant neoplasm of unspecified ureter: Secondary | ICD-10-CM

## 2019-05-12 DIAGNOSIS — I2510 Atherosclerotic heart disease of native coronary artery without angina pectoris: Secondary | ICD-10-CM | POA: Insufficient documentation

## 2019-05-12 DIAGNOSIS — K862 Cyst of pancreas: Secondary | ICD-10-CM | POA: Diagnosis not present

## 2019-05-12 MED ORDER — IOHEXOL 300 MG/ML  SOLN
100.0000 mL | Freq: Once | INTRAMUSCULAR | Status: AC | PRN
  Administered 2019-05-12: 100 mL via INTRAVENOUS

## 2019-05-17 ENCOUNTER — Inpatient Hospital Stay: Payer: PPO

## 2019-05-17 ENCOUNTER — Encounter: Payer: Self-pay | Admitting: Oncology

## 2019-05-17 ENCOUNTER — Inpatient Hospital Stay (HOSPITAL_BASED_OUTPATIENT_CLINIC_OR_DEPARTMENT_OTHER): Payer: PPO | Admitting: Oncology

## 2019-05-17 ENCOUNTER — Other Ambulatory Visit: Payer: Self-pay

## 2019-05-17 ENCOUNTER — Inpatient Hospital Stay: Payer: PPO | Attending: Oncology

## 2019-05-17 VITALS — BP 138/76 | HR 86 | Temp 96.8°F | Resp 16 | Wt 202.0 lb

## 2019-05-17 DIAGNOSIS — C779 Secondary and unspecified malignant neoplasm of lymph node, unspecified: Secondary | ICD-10-CM | POA: Diagnosis not present

## 2019-05-17 DIAGNOSIS — C680 Malignant neoplasm of urethra: Secondary | ICD-10-CM | POA: Diagnosis not present

## 2019-05-17 DIAGNOSIS — L309 Dermatitis, unspecified: Secondary | ICD-10-CM | POA: Insufficient documentation

## 2019-05-17 DIAGNOSIS — Z85828 Personal history of other malignant neoplasm of skin: Secondary | ICD-10-CM | POA: Insufficient documentation

## 2019-05-17 DIAGNOSIS — L271 Localized skin eruption due to drugs and medicaments taken internally: Secondary | ICD-10-CM | POA: Insufficient documentation

## 2019-05-17 DIAGNOSIS — Z882 Allergy status to sulfonamides status: Secondary | ICD-10-CM | POA: Insufficient documentation

## 2019-05-17 DIAGNOSIS — R21 Rash and other nonspecific skin eruption: Secondary | ICD-10-CM | POA: Insufficient documentation

## 2019-05-17 DIAGNOSIS — R918 Other nonspecific abnormal finding of lung field: Secondary | ICD-10-CM | POA: Diagnosis not present

## 2019-05-17 DIAGNOSIS — C669 Malignant neoplasm of unspecified ureter: Secondary | ICD-10-CM

## 2019-05-17 DIAGNOSIS — Z87442 Personal history of urinary calculi: Secondary | ICD-10-CM | POA: Insufficient documentation

## 2019-05-17 DIAGNOSIS — K862 Cyst of pancreas: Secondary | ICD-10-CM | POA: Insufficient documentation

## 2019-05-17 DIAGNOSIS — N323 Diverticulum of bladder: Secondary | ICD-10-CM | POA: Insufficient documentation

## 2019-05-17 DIAGNOSIS — R5383 Other fatigue: Secondary | ICD-10-CM | POA: Diagnosis not present

## 2019-05-17 DIAGNOSIS — Z8249 Family history of ischemic heart disease and other diseases of the circulatory system: Secondary | ICD-10-CM | POA: Insufficient documentation

## 2019-05-17 DIAGNOSIS — Z85820 Personal history of malignant melanoma of skin: Secondary | ICD-10-CM | POA: Insufficient documentation

## 2019-05-17 DIAGNOSIS — Z885 Allergy status to narcotic agent status: Secondary | ICD-10-CM | POA: Insufficient documentation

## 2019-05-17 DIAGNOSIS — N400 Enlarged prostate without lower urinary tract symptoms: Secondary | ICD-10-CM | POA: Insufficient documentation

## 2019-05-17 DIAGNOSIS — I10 Essential (primary) hypertension: Secondary | ICD-10-CM | POA: Diagnosis not present

## 2019-05-17 DIAGNOSIS — Z79899 Other long term (current) drug therapy: Secondary | ICD-10-CM | POA: Diagnosis not present

## 2019-05-17 DIAGNOSIS — K8020 Calculus of gallbladder without cholecystitis without obstruction: Secondary | ICD-10-CM | POA: Diagnosis not present

## 2019-05-17 DIAGNOSIS — Z95828 Presence of other vascular implants and grafts: Secondary | ICD-10-CM

## 2019-05-17 DIAGNOSIS — T451X5A Adverse effect of antineoplastic and immunosuppressive drugs, initial encounter: Secondary | ICD-10-CM | POA: Diagnosis not present

## 2019-05-17 DIAGNOSIS — Z7952 Long term (current) use of systemic steroids: Secondary | ICD-10-CM | POA: Diagnosis not present

## 2019-05-17 DIAGNOSIS — K5730 Diverticulosis of large intestine without perforation or abscess without bleeding: Secondary | ICD-10-CM | POA: Insufficient documentation

## 2019-05-17 DIAGNOSIS — Z8042 Family history of malignant neoplasm of prostate: Secondary | ICD-10-CM | POA: Insufficient documentation

## 2019-05-17 DIAGNOSIS — I700 Atherosclerosis of aorta: Secondary | ICD-10-CM | POA: Insufficient documentation

## 2019-05-17 DIAGNOSIS — I2510 Atherosclerotic heart disease of native coronary artery without angina pectoris: Secondary | ICD-10-CM | POA: Diagnosis not present

## 2019-05-17 DIAGNOSIS — L270 Generalized skin eruption due to drugs and medicaments taken internally: Secondary | ICD-10-CM | POA: Diagnosis not present

## 2019-05-17 LAB — CBC WITH DIFFERENTIAL/PLATELET
Abs Immature Granulocytes: 0.07 10*3/uL (ref 0.00–0.07)
Basophils Absolute: 0.1 10*3/uL (ref 0.0–0.1)
Basophils Relative: 1 %
Eosinophils Absolute: 0.1 10*3/uL (ref 0.0–0.5)
Eosinophils Relative: 1 %
HCT: 40 % (ref 39.0–52.0)
Hemoglobin: 13.5 g/dL (ref 13.0–17.0)
Immature Granulocytes: 1 %
Lymphocytes Relative: 11 %
Lymphs Abs: 1.1 10*3/uL (ref 0.7–4.0)
MCH: 30.5 pg (ref 26.0–34.0)
MCHC: 33.8 g/dL (ref 30.0–36.0)
MCV: 90.3 fL (ref 80.0–100.0)
Monocytes Absolute: 0.7 10*3/uL (ref 0.1–1.0)
Monocytes Relative: 7 %
Neutro Abs: 7.6 10*3/uL (ref 1.7–7.7)
Neutrophils Relative %: 79 %
Platelets: 300 10*3/uL (ref 150–400)
RBC: 4.43 MIL/uL (ref 4.22–5.81)
RDW: 15.3 % (ref 11.5–15.5)
WBC: 9.6 10*3/uL (ref 4.0–10.5)
nRBC: 0 % (ref 0.0–0.2)

## 2019-05-17 LAB — COMPREHENSIVE METABOLIC PANEL
ALT: 28 U/L (ref 0–44)
AST: 32 U/L (ref 15–41)
Albumin: 3.8 g/dL (ref 3.5–5.0)
Alkaline Phosphatase: 49 U/L (ref 38–126)
Anion gap: 10 (ref 5–15)
BUN: 23 mg/dL (ref 8–23)
CO2: 26 mmol/L (ref 22–32)
Calcium: 8.9 mg/dL (ref 8.9–10.3)
Chloride: 95 mmol/L — ABNORMAL LOW (ref 98–111)
Creatinine, Ser: 1.15 mg/dL (ref 0.61–1.24)
GFR calc Af Amer: >60 mL/min (ref >60)
GFR calc non Af Amer: 57 mL/min — ABNORMAL LOW (ref >60)
Glucose, Bld: 158 mg/dL — ABNORMAL HIGH (ref 70–99)
Potassium: 3.7 mmol/L (ref 3.5–5.1)
Sodium: 131 mmol/L — ABNORMAL LOW (ref 135–145)
Total Bilirubin: 0.8 mg/dL (ref 0.3–1.2)
Total Protein: 6.4 g/dL — ABNORMAL LOW (ref 6.5–8.1)

## 2019-05-17 MED ORDER — SODIUM CHLORIDE 0.9% FLUSH
10.0000 mL | Freq: Once | INTRAVENOUS | Status: AC
  Administered 2019-05-17: 10 mL via INTRAVENOUS
  Filled 2019-05-17: qty 10

## 2019-05-17 MED ORDER — PREDNISONE 10 MG PO TABS: 10.0000 mg | ORAL_TABLET | ORAL | 0 refills | Status: DC

## 2019-05-17 MED ORDER — HEPARIN SOD (PORK) LOCK FLUSH 100 UNIT/ML IV SOLN
500.0000 [IU] | Freq: Once | INTRAVENOUS | Status: AC
  Administered 2019-05-17: 15:00:00 500 [IU] via INTRAVENOUS
  Filled 2019-05-17: qty 5

## 2019-05-17 NOTE — Progress Notes (Signed)
Day after tx pt got rash on his bottom, then back, then arms, shoulders. Still used the cream, got prednisone and once it taper to 2 tablets it did not help anymore. He also got hydroxyzine for itching. He also has dry rough spots on his heels and knees

## 2019-05-23 NOTE — Progress Notes (Signed)
Hematology/Oncology Consult note [facility]  Telephone:([phone] [phone]  Patient Care Team: Idelle Crouch, MD as PCP - General (Internal Medicine) Sindy Guadeloupe, MD as Consulting Physician (Hematology and Oncology) Sindy Guadeloupe, MD as Consulting Physician (Hematology and Oncology) Sindy Guadeloupe, MD as Consulting Physician (Hematology and Oncology)   Name of the patient: Anthony Gonzales  [id number]  14-Sep-1931   Date of visit: 05/23/19  Diagnosis- Metastatic urothelial carcinoma with possible mets to the obturator node. Indeterminate hilar lymph nodes and LUL lung nodule   Chief complaint/ Reason for visit-on treatment assessment prior to cycle 5-day 1 of Padcev  Heme/Onc history: patient is a 86 year old male with past medical history significant for hypertension and long-standing history of superficial bladder cancer for which he sees [name]. He has undergone TURBT as well as ureteroscopy since 2017 along with mitomycin as well in the past. Most recently he underwent CT abdomen on 08/06/2017 which showed a soft tissue mass in the right renal pelvis concerning for upper tract urothelial neoplasm. Soft tissue fullness at the right ureterovesical junction with proximal right hydroureteronephrosis. Several millimeter attenuation lesion in the pancreatic head.  He underwent diagnostic ureteroscopy and was found to have 2 high-grade lesions within his right kidney. There was a nodular high-grade appearing lesion in the right anterior renal pelvis. There was also a second ureteral tumor fungating from the right ureteral orifice extending into the distal ureter also consistent with high-grade invasive urothelial carcinoma. Muscle invasion could not be assessed. He also underwent a CT chest which showed a rounded nodule in the right upper lobe measuring 14 mm concerning for metastases. 2 other 3 mm lesions were also noted in the  left upper lobe likely benign  Plan initially was neoadjuvant chemotherapy followed by possible surgery but given the presence of lung lesion patient has been referred to oncology for the same.  PET/CT on 09/21/17 showed:IMPRESSION: 1. Hypermetabolic right obturator lymph node is most indicative of metastatic disease. 2. Mildly hypermetabolic left hilar lymph nodes are nonspecific. Continued attention on follow-up exams is warranted. 3. Right upper lobe pulmonary nodule shows metabolism after just above blood pool and is therefore indeterminate. Continued attention on follow-up exams is warranted. 4. Aortic atherosclerosis (ICD10-170.0). Coronary artery calcification. 5. Cholelithiasis  Carboplatin/ gemzar1 week on and one-week off as patient could not tolerate 2-week on and one week off regimen. Cycle 1 started on 09/22/2017 Disease progression in March 2020. Switched to second [employer]  Patient had disease controlled with Keytruda. However he was noted to have worsening dermatitis with constant flareups requiring steroids. Beryle Flock is therefore being kept on hold and patient will be switched to third line Padcev   Interval history-patient had another flareup of his rash over his entire body as well as forearms and was prescribed a 10-day steroid course by Dr. Doy Hutching.  He is down to 10 mg of prednisone at this time and feels that his rash is coming back.  He is also using topical steroids  ECOG PS- 1 Pain scale- 0   Review of systems- Review of Systems  Constitutional: Negative for chills, fever, malaise/fatigue and weight loss.  HENT: Negative for congestion, ear discharge and nosebleeds.   Eyes: Negative for blurred vision.  Respiratory: Negative for cough, hemoptysis, sputum production, shortness of breath and wheezing.   Cardiovascular: Negative for chest pain, palpitations, orthopnea and claudication.  Gastrointestinal: Negative for abdominal pain, blood in  stool, constipation, diarrhea, heartburn, melena,  nausea and vomiting.  Genitourinary: Negative for dysuria, flank pain, frequency, hematuria and urgency.  Musculoskeletal: Negative for back pain, joint pain and myalgias.  Skin: Positive for rash.  Neurological: Negative for dizziness, tingling, focal weakness, seizures, weakness and headaches.  Endo/Heme/Allergies: Does not bruise/bleed easily.  Psychiatric/Behavioral: Negative for depression and suicidal ideas. The patient does not have insomnia.      Allergies  Allergen Reactions  . Demerol [Meperidine] Nausea And Vomiting  . Lipitor [Atorvastatin] Swelling  . Sulfa Antibiotics Nausea And Vomiting and Rash     Past Medical History:  Diagnosis Date  . Arthritis   . Benign fibroma of prostate 08/23/2013  . BPH (benign prostatic hyperplasia)   . Calculus of kidney 08/23/2013  . Glaucoma    no drops in 3 mo pressure good, pt denies glaucoma, eye pressure has been measuring alright.  . History of kidney stones   . HLD (hyperlipidemia)   . HOH (hard of hearing)    Left Hearing Aid  . HTN (hypertension) 12/26/2014  . Hypertension   . Hyponatremia 12/26/2014  . Migraines    history of migraines when he was younger.  [name] Restless leg syndrome   . Sinus drainage   . Skin cancer   . Urothelial cancer ([place])    chemo tx's.  [name] UTI (lower urinary tract infection) 12/26/2014  . Vertigo      Past Surgical History:  Procedure Laterality Date  . COLONOSCOPY    . CYSTOSCOPY W/ RETROGRADES Right 01/30/2015   Procedure: CYSTOSCOPY WITH RETROGRADE PYELOGRAM;  Surgeon: [name], MD;  Location: [facility];  Service: Urology;  Laterality: Right;  . CYSTOSCOPY W/ RETROGRADES Bilateral 02/26/2016   Procedure: CYSTOSCOPY WITH RETROGRADE PYELOGRAM;  Surgeon: [name], MD;  Location: [facility];  Service: Urology;  Laterality: Bilateral;  . CYSTOSCOPY W/ URETERAL STENT PLACEMENT Right 08/20/2015   Procedure: CYSTOSCOPY WITH RETROGRADE  PYELOGRAM/POSSIBLE URETERAL STENT PLACEMENT/BLADDER BIOPSY;  Surgeon: [name], MD;  Location: [facility];  Service: Urology;  Laterality: Right;  . CYSTOSCOPY W/ URETERAL STENT PLACEMENT Right 09/12/2015   Procedure: CYSTOSCOPY WITH STENT REPLACEMENT;  Surgeon: [name], MD;  Location: [facility];  Service: Urology;  Laterality: Right;  . CYSTOSCOPY WITH BIOPSY Right 09/12/2015   Procedure: CYSTOSCOPY WITH BLADDER AND URETERAL BIOPSY;  Surgeon: [name], MD;  Location: [facility];  Service: Urology;  Laterality: Right;  . CYSTOSCOPY WITH STENT PLACEMENT Right 01/30/2015   Procedure: CYSTOSCOPY WITH STENT PLACEMENT;  Surgeon: [name], MD;  Location: [facility];  Service: Urology;  Laterality: Right;  . CYSTOSCOPY WITH STENT PLACEMENT Right 12/28/2017   Procedure: [place] WITH STENT Exchange;  Surgeon: [name], MD;  Location: [facility];  Service: Urology;  Laterality: Right;  . CYSTOSCOPY/URETEROSCOPY/HOLMIUM LASER/STENT PLACEMENT Right 08/19/2017   Procedure: CYSTOSCOPY/URETEROSCOPY/HOLMIUM LASER/STENT PLACEMENT;  Surgeon: [name], MD;  Location: [facility];  Service: Urology;  Laterality: Right;  . EYE SURGERY Bilateral    Cataract Extraction with IOL  . goiter removal    . HOLMIUM LASER APPLICATION N/A 08/27/3557   Procedure:  HOLMIUM LASER APPLICATION;  Surgeon: [name], MD;  Location: [facility];  Service: Urology;  Laterality: N/A;  . PORTA CATH INSERTION N/A 09/23/2017   Procedure: PORTA CATH INSERTION;  Surgeon: Algernon Huxley, MD;  Location: [facility];  Service: Cardiovascular;  Laterality: N/A;  . SPERMATOCELECTOMY    . TONSILLECTOMY    . TRANSURETHRAL RESECTION OF BLADDER TUMOR N/A 02/26/2016   Procedure: TRANSURETHRAL RESECTION OF BLADDER TUMOR (  TURBT);  Surgeon: [name], MD;  Location: [facility];  Service: Urology;  Laterality: N/A;  . TRANSURETHRAL RESECTION OF BLADDER TUMOR WITH MITOMYCIN-C N/A 09/12/2015   Procedure: TRANSURETHRAL  RESECTION OF BLADDER TUMOR ;  Surgeon: [name], MD;  Location: [facility];  Service: Urology;  Laterality: N/A;  . TRANSURETHRAL RESECTION OF BLADDER TUMOR WITH MITOMYCIN-C N/A 03/24/2016   Procedure: TRANSURETHRAL RESECTION OF BLADDER TUMOR WITH MITOMYCIN-C  (SMALL);  Surgeon: [name], MD;  Location: [facility];  Service: Urology;  Laterality: N/A;  . URETERAL BIOPSY Right 08/19/2017   Procedure: Renal Mass BIOPSY;  Surgeon: [name], MD;  Location: [facility];  Service: Urology;  Laterality: Right;  . URETEROSCOPY Right 01/30/2015   Procedure: URETEROSCOPY/ WITH BIOPSY AND CYTOLOGY BRUSHING;  Surgeon: [name], MD;  Location: [facility];  Service: Urology;  Laterality: Right;  . URETEROSCOPY Right 08/20/2015   Procedure: URETEROSCOPY;  Surgeon: [name], MD;  Location: [facility];  Service: Urology;  Laterality: Right;  . URETEROSCOPY Right 09/12/2015   Procedure: URETEROSCOPY;  Surgeon: [name], MD;  Location: [facility];  Service: Urology;  Laterality: Right;  . URETEROSCOPY Right 02/26/2016   Procedure: URETEROSCOPY;  Surgeon: [name], MD;  Location: [facility];  Service: Urology;  Laterality: Right;    Social History   Socioeconomic History  . Marital status: Married    Spouse name: Not on file  . Number of children: Not on file  . Years of education: Not on file  . Highest education level: Not on file  Occupational History  . Not on file  Tobacco Use  . Smoking status: Never Smoker  . Smokeless tobacco: Never Used  Substance and Sexual Activity  . Alcohol use: No    Alcohol/week: 0.0 standard drinks  . Drug use: No  . Sexual activity: Not on file  Other Topics Concern  . Not on file  Social History Narrative  . Not on file   Social Determinants of Health   Financial Resource Strain:   . Difficulty of Paying Living Expenses:   Food Insecurity:   . Worried About Charity fundraiser in the Last Year:   . Arboriculturist in the Last Year:     Transportation Needs:   . Film/video editor (Medical):   [name] Lack of Transportation (Non-Medical):   Physical Activity:   . Days of Exercise per Week:   . Minutes of Exercise per Session:   Stress:   . Feeling of Stress :   Social Connections:   . Frequency of Communication with Friends and Family:   . Frequency of Social Gatherings with Friends and Family:   . Attends Religious Services:   . Active Member of Clubs or Organizations:   . Attends Archivist Meetings:   [name] Marital Status:   Intimate Partner Violence:   . Fear of Current or Ex-Partner:   . Emotionally Abused:   [name] Physically Abused:   . Sexually Abused:     Family History  Problem Relation Age of Onset  . Hypertension Mother   . Hypertension Father   . Prostate cancer Brother   . Kidney disease Neg Hx   . Kidney cancer Neg Hx   . Bladder Cancer Neg Hx      Current Outpatient Medications:  .  acetaminophen (TYLENOL) 500 MG tablet, Take 1,000 mg by mouth daily as needed for moderate pain. , Disp: , Rfl:  .  amLODipine (NORVASC) 5 MG tablet, Take  5 mg by mouth daily. , Disp: , Rfl:  .  benazepril-hydrochlorthiazide (LOTENSIN HCT) 20-12.5 MG tablet, Take 1 tablet by mouth daily. , Disp: , Rfl:  .  cetirizine (ZYRTEC) 10 MG tablet, Take 10 mg by mouth daily., Disp: , Rfl:  .  diazepam (VALIUM) 5 MG tablet, TAKE 1 TABLET (5 MG TOTAL) BY MOUTH EVERY 12 (TWELVE) HOURS AS NEEDED FOR ANXIETY OR SLEEP, Disp: , Rfl:  .  docusate sodium (COLACE) 100 MG capsule, Take 100 mg by mouth 2 (two) times daily. , Disp: , Rfl:  .  fenofibrate micronized (LOFIBRA) 134 MG capsule, Take 134 mg by mouth daily before breakfast. , Disp: , Rfl:  .  fluticasone (FLONASE) 50 MCG/ACT nasal spray, Place 2 sprays into both nostrils at bedtime., Disp: , Rfl:  .  HYDROcodone-acetaminophen (NORCO/VICODIN) 5-325 MG tablet, Take 1 tablet by mouth every 6 (six) hours as needed for severe pain., Disp: 30 tablet, Rfl: 0 .  hydrOXYzine  (ATARAX/VISTARIL) 25 MG tablet, Take 25 mg by mouth 3 (three) times daily as needed., Disp: , Rfl:  .  mirabegron ER (MYRBETRIQ) 50 MG TB24 tablet, Take 1 tablet (50 mg total) by mouth daily., Disp: 30 tablet, Rfl: 11 .  Multiple Vitamin (MULTIVITAMIN WITH MINERALS) TABS tablet, Take 1 tablet by mouth daily., Disp: , Rfl:  .  nystatin (MYCOSTATIN/NYSTOP) powder, APPLY TO AFFECTED AREA 4 TIMES A DAY, Disp: 15 g, Rfl: 0 .  Potassium 99 MG TABS, Take 99 mg by mouth at bedtime., Disp: , Rfl:  .  psyllium (METAMUCIL) 58.6 % packet, Take 1 packet by mouth daily. , Disp: , Rfl:  .  tamsulosin (FLOMAX) 0.4 MG CAPS capsule, Take 1 capsule (0.4 mg total) by mouth daily., Disp: 90 capsule, Rfl: 3 .  triamcinolone cream (KENALOG) 0.1 %, Apply 1 application topically 2 (two) times daily., Disp: , Rfl:  .  predniSONE (DELTASONE) 10 MG tablet, Take 1 tablet (10 mg total) by mouth as directed. With food-Take 5 tablets daily x 4 days, then 4 tablets daily x 4 days, then 3 tablets daily x 4 days , 2 tablets daily x 4 days, then 1 tablet daily x 4 days, then 1/2 tablet daily x 4 days, then stop, Disp: 62 tablet, Rfl: 0 .  TRIAMCINOLONE ACETONIDE EX, Apply 1 application topically 2 (two) times daily. 0.2% and cream made by Dr. Deliah Boston and he sees him on reg. basis, Disp: , Rfl:  No current facility-administered medications for this visit.  Facility-Administered Medications Ordered in Other Visits:  .  sodium chloride flush (NS) 0.9 % injection 10 mL, 10 mL, Intravenous, Once, Sindy Guadeloupe, MD  Physical exam:  Vitals:   05/17/19 1341  BP: 138/76  Pulse: 86  Resp: 16  Temp: (!) 96.8 F (36 C)  TempSrc: Tympanic  Weight: 202 lb (91.6 kg)   Physical Exam HENT:     Head: Normocephalic and atraumatic.  Eyes:     Pupils: Pupils are equal, round, and reactive to light.  Cardiovascular:     Rate and Rhythm: Normal rate and regular rhythm.     Heart sounds: Normal heart sounds.  Pulmonary:     Effort: Pulmonary  effort is normal.     Breath sounds: Normal breath sounds.  Abdominal:     General: Bowel sounds are normal.     Palpations: Abdomen is soft.  Musculoskeletal:     Cervical back: Normal range of motion.  Skin:    General: Skin is  warm and dry.     Comments: Scattered areas of maculopapular rash seen over torso as well as back as seen in the picture  Neurological:     Mental Status: He is alert and oriented to person, place, and time.      CMP Latest Ref Rng & Units 05/17/2019  Glucose 70 - 99 mg/dL 158(H)  BUN 8 - 23 mg/dL 23  Creatinine 0.61 - 1.24 mg/dL 1.15  Sodium 135 - 145 mmol/L 131(L)  Potassium 3.5 - 5.1 mmol/L 3.7  Chloride 98 - 111 mmol/L 95(L)  CO2 22 - 32 mmol/L 26  Calcium 8.9 - 10.3 mg/dL 8.9  Total Protein 6.5 - 8.1 g/dL 6.4(L)  Total Bilirubin 0.3 - 1.2 mg/dL 0.8  Alkaline Phos 38 - 126 U/L 49  AST 15 - 41 U/L 32  ALT 0 - 44 U/L 28   CBC Latest Ref Rng & Units 05/17/2019  WBC 4.0 - 10.5 K/uL 9.6  Hemoglobin 13.0 - 17.0 g/dL 13.5  Hematocrit 39.0 - 52.0 % 40.0  Platelets 150 - 400 K/uL 300       CT Chest W Contrast  Result Date: 05/12/2019 CLINICAL DATA:  86 year old male with history of urothelial carcinoma of the distal ureter. Follow-up study. EXAM: CT CHEST, ABDOMEN, AND PELVIS WITH CONTRAST TECHNIQUE: Multidetector CT imaging of the chest, abdomen and pelvis was performed following the standard protocol during bolus administration of intravenous contrast. CONTRAST:  153mL OMNIPAQUE IOHEXOL 300 MG/ML  SOLN COMPARISON:  CT of the chest, abdomen and pelvis 12/14/2018. FINDINGS: CT CHEST FINDINGS Cardiovascular: Heart size is normal. There is no significant pericardial fluid, thickening or pericardial calcification. There is aortic atherosclerosis, as well as atherosclerosis of the great vessels of the mediastinum and the coronary arteries, including calcified atherosclerotic plaque in the left main, left anterior descending, left circumflex and right coronary  arteries. Calcifications of the aortic valve. Right single-lumen porta cath with tip terminating in the distal superior vena cava. Mediastinum/Nodes: No pathologically enlarged mediastinal or hilar lymph nodes. Esophagus is unremarkable in appearance. No axillary lymphadenopathy. Lungs/Pleura: Multiple small pulmonary nodules appear stable in size, number and distribution. The largest of these is in the medial aspect of the right upper lobe (axial image 81 of series 3)where there is a smoothly marginated 1.4 x 1.0 cm nodule (mean diameter of 1.2 cm). No other new suspicious appearing pulmonary nodules or masses are noted. No acute consolidative airspace disease. No pleural effusions. Musculoskeletal: There are no aggressive appearing lytic or blastic lesions noted in the visualized portions of the skeleton. CT ABDOMEN PELVIS FINDINGS Hepatobiliary: No suspicious cystic or solid hepatic lesions. No intra or extrahepatic biliary ductal dilatation. Gallbladder is normal in appearance. Pancreas: 1.6 x 1.1 cm low-attenuation lesion in the inferior aspect of the uncinate process, stable compared to the prior study. No other pancreatic mass. No pancreatic ductal dilatation. No peripancreatic fluid collections or inflammatory changes. Spleen: Unremarkable. Adrenals/Urinary Tract: Exophytic 4.3 cm low-attenuation lesion in the lower pole the right kidney, compatible with a simple cyst. Subcentimeter low-attenuation lesion in the upper pole the left kidney, too small to characterize, but statistically likely to represent a tiny cyst. No hydroureteronephrosis. Small bladder diverticulum posterior to the left ureterovesicular junction measuring 1.6 cm. Urinary bladder is otherwise normal in appearance. Stomach/Bowel: Normal appearance of the stomach. No pathologic dilatation of small bowel or colon. Numerous colonic diverticulae are noted, particularly in the sigmoid colon, without surrounding inflammatory changes to suggest an  acute diverticulitis at this  time. Normal appendix. Vascular/Lymphatic: Aortic atherosclerosis, without evidence of aneurysm or dissection in the abdominal or pelvic vasculature. No lymphadenopathy noted in the abdomen or pelvis. Reproductive: Prostate gland and seminal vesicles are unremarkable in appearance. Other: No significant volume of ascites.  No pneumoperitoneum. Musculoskeletal: There are no aggressive appearing lytic or blastic lesions noted in the visualized portions of the skeleton. IMPRESSION: 1. All previously noted pulmonary nodules appear stable in size and number compared to prior examinations. 2. No signs of metastatic disease in the abdomen or pelvis. 3. Small cystic lesion in the inferior aspect of the uncinate process of the pancreas measuring 1.6 x 1.1 cm, stable. No definite communication with the main pancreatic duct. This is favored to represent a small benign lesions such as a pancreatic pseudocyst or less likely a side branch IPMN. Continued attention on follow-up studies is recommended to ensure continued stability. 4. Colonic diverticulosis without evidence of acute diverticulitis at this time. 5. Aortic atherosclerosis, in addition to left main and 3 vessel coronary artery disease. 6. There are calcifications of the aortic valve. Echocardiographic correlation for evaluation of potential valvular dysfunction may be warranted if clinically indicated. Electronically Signed   By: Vinnie Langton M.D.   On: 05/12/2019 11:50   CT Abdomen Pelvis W Contrast  Result Date: 05/12/2019 CLINICAL DATA:  86 year old male with history of urothelial carcinoma of the distal ureter. Follow-up study. EXAM: CT CHEST, ABDOMEN, AND PELVIS WITH CONTRAST TECHNIQUE: Multidetector CT imaging of the chest, abdomen and pelvis was performed following the standard protocol during bolus administration of intravenous contrast. CONTRAST:  155mL OMNIPAQUE IOHEXOL 300 MG/ML  SOLN COMPARISON:  CT of the chest, abdomen  and pelvis 12/14/2018. FINDINGS: CT CHEST FINDINGS Cardiovascular: Heart size is normal. There is no significant pericardial fluid, thickening or pericardial calcification. There is aortic atherosclerosis, as well as atherosclerosis of the great vessels of the mediastinum and the coronary arteries, including calcified atherosclerotic plaque in the left main, left anterior descending, left circumflex and right coronary arteries. Calcifications of the aortic valve. Right single-lumen porta cath with tip terminating in the distal superior vena cava. Mediastinum/Nodes: No pathologically enlarged mediastinal or hilar lymph nodes. Esophagus is unremarkable in appearance. No axillary lymphadenopathy. Lungs/Pleura: Multiple small pulmonary nodules appear stable in size, number and distribution. The largest of these is in the medial aspect of the right upper lobe (axial image 81 of series 3)where there is a smoothly marginated 1.4 x 1.0 cm nodule (mean diameter of 1.2 cm). No other new suspicious appearing pulmonary nodules or masses are noted. No acute consolidative airspace disease. No pleural effusions. Musculoskeletal: There are no aggressive appearing lytic or blastic lesions noted in the visualized portions of the skeleton. CT ABDOMEN PELVIS FINDINGS Hepatobiliary: No suspicious cystic or solid hepatic lesions. No intra or extrahepatic biliary ductal dilatation. Gallbladder is normal in appearance. Pancreas: 1.6 x 1.1 cm low-attenuation lesion in the inferior aspect of the uncinate process, stable compared to the prior study. No other pancreatic mass. No pancreatic ductal dilatation. No peripancreatic fluid collections or inflammatory changes. Spleen: Unremarkable. Adrenals/Urinary Tract: Exophytic 4.3 cm low-attenuation lesion in the lower pole the right kidney, compatible with a simple cyst. Subcentimeter low-attenuation lesion in the upper pole the left kidney, too small to characterize, but statistically likely to  represent a tiny cyst. No hydroureteronephrosis. Small bladder diverticulum posterior to the left ureterovesicular junction measuring 1.6 cm. Urinary bladder is otherwise normal in appearance. Stomach/Bowel: Normal appearance of the stomach. No pathologic dilatation of small  bowel or colon. Numerous colonic diverticulae are noted, particularly in the sigmoid colon, without surrounding inflammatory changes to suggest an acute diverticulitis at this time. Normal appendix. Vascular/Lymphatic: Aortic atherosclerosis, without evidence of aneurysm or dissection in the abdominal or pelvic vasculature. No lymphadenopathy noted in the abdomen or pelvis. Reproductive: Prostate gland and seminal vesicles are unremarkable in appearance. Other: No significant volume of ascites.  No pneumoperitoneum. Musculoskeletal: There are no aggressive appearing lytic or blastic lesions noted in the visualized portions of the skeleton. IMPRESSION: 1. All previously noted pulmonary nodules appear stable in size and number compared to prior examinations. 2. No signs of metastatic disease in the abdomen or pelvis. 3. Small cystic lesion in the inferior aspect of the uncinate process of the pancreas measuring 1.6 x 1.1 cm, stable. No definite communication with the main pancreatic duct. This is favored to represent a small benign lesions such as a pancreatic pseudocyst or less likely a side branch IPMN. Continued attention on follow-up studies is recommended to ensure continued stability. 4. Colonic diverticulosis without evidence of acute diverticulitis at this time. 5. Aortic atherosclerosis, in addition to left main and 3 vessel coronary artery disease. 6. There are calcifications of the aortic valve. Echocardiographic correlation for evaluation of potential valvular dysfunction may be warranted if clinically indicated. Electronically Signed   By: Vinnie Langton M.D.   On: 05/12/2019 11:50     Assessment and plan- Patient is a 86 y.o.  male with metastatic urothelial carcinoma with metastases to the lymph nodes.  He is here for on treatment assessment prior to cycle 5-day 1 of Padcev  Patient has had ongoing skin rash even when he was back on Keytruda.  We had to stop Keytruda due to ongoing dermatitis which is possibly autoimmune.  He was then switched to Padcev.  Padcev can also cause skin rash and it is unclear if his rash is related to Padcev or not.  He seems to be getting more flareups while receiving treatment.  Also his present rash has been more maculopapular appearance which was different from his Keytruda rash which were discrete erythematous nodules.  I would like him to go back on a higher dose of prednisone starting at 50 mg which she will take for 3 days followed by 40, 30, 20, 10 and 5 mg each for 3 days.  I will hold off on giving him Padcev today and give him a break from treatment for the next 4 weeks.  I will see how his rash is doing in 2 weeks time.  Visit Diagnosis 1. Drug-induced skin rash   2. 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 Randa Evens, MD, MPH [facility] [phone] 05/23/2019 1:28 PM

## 2019-05-31 ENCOUNTER — Inpatient Hospital Stay: Payer: PPO | Admitting: Oncology

## 2019-06-02 ENCOUNTER — Encounter: Payer: Self-pay | Admitting: Oncology

## 2019-06-02 ENCOUNTER — Inpatient Hospital Stay (HOSPITAL_BASED_OUTPATIENT_CLINIC_OR_DEPARTMENT_OTHER): Payer: PPO | Admitting: Oncology

## 2019-06-02 VITALS — BP 127/78 | HR 90 | Temp 95.6°F | Resp 16 | Wt 193.3 lb

## 2019-06-02 DIAGNOSIS — C669 Malignant neoplasm of unspecified ureter: Secondary | ICD-10-CM | POA: Diagnosis not present

## 2019-06-02 DIAGNOSIS — L270 Generalized skin eruption due to drugs and medicaments taken internally: Secondary | ICD-10-CM

## 2019-06-02 DIAGNOSIS — C680 Malignant neoplasm of urethra: Secondary | ICD-10-CM | POA: Diagnosis not present

## 2019-06-02 NOTE — Progress Notes (Signed)
Pt has 2 more days of dose of prednisone then switch to 10 mg daily x 4 days and then 1/2 pill daily x 4 days. He itches on his back when he sits up against furniture. He has scratched it a lot. He does itch on left and rgiht side of rib area about 6 inches from axilla area.since he has been taking miralax he has good BM's

## 2019-06-03 ENCOUNTER — Other Ambulatory Visit: Payer: Self-pay | Admitting: Oncology

## 2019-06-03 NOTE — Progress Notes (Signed)
Hematology/Oncology Consult note [facility]  Telephone:([phone] [phone]  Patient Care Team: Idelle Crouch, MD as PCP - General (Internal Medicine) Sindy Guadeloupe, MD as Consulting Physician (Hematology and Oncology) Sindy Guadeloupe, MD as Consulting Physician (Hematology and Oncology) Sindy Guadeloupe, MD as Consulting Physician (Hematology and Oncology)   Name of the patient: Anthony Gonzales  [id number]  12-05-1931   Date of visit: 06/03/19  Diagnosis- Metastatic urothelial carcinoma with possible mets to the obturator node. Indeterminate hilar lymph nodes and LUL lung nodule  Chief complaint/ Reason for visit-assessment of ongoing skin rash  Heme/Onc history: patient is a 86 year old male with past medical history significant for hypertension and long-standing history of superficial bladder cancer for which he sees [name]. He has undergone TURBT as well as ureteroscopy since 2017 along with mitomycin as well in the past. Most recently he underwent CT abdomen on 08/06/2017 which showed a soft tissue mass in the right renal pelvis concerning for upper tract urothelial neoplasm. Soft tissue fullness at the right ureterovesical junction with proximal right hydroureteronephrosis. Several millimeter attenuation lesion in the pancreatic head.  He underwent diagnostic ureteroscopy and was found to have 2 high-grade lesions within his right kidney. There was a nodular high-grade appearing lesion in the right anterior renal pelvis. There was also a second ureteral tumor fungating from the right ureteral orifice extending into the distal ureter also consistent with high-grade invasive urothelial carcinoma. Muscle invasion could not be assessed. He also underwent a CT chest which showed a rounded nodule in the right upper lobe measuring 14 mm concerning for metastases. 2 other 3 mm lesions were also noted in the left upper lobe likely  benign  Plan initially was neoadjuvant chemotherapy followed by possible surgery but given the presence of lung lesion patient has been referred to oncology for the same.  PET/CT on 09/21/17 showed:IMPRESSION: 1. Hypermetabolic right obturator lymph node is most indicative of metastatic disease. 2. Mildly hypermetabolic left hilar lymph nodes are nonspecific. Continued attention on follow-up exams is warranted. 3. Right upper lobe pulmonary nodule shows metabolism after just above blood pool and is therefore indeterminate. Continued attention on follow-up exams is warranted. 4. Aortic atherosclerosis (ICD10-170.0). Coronary artery calcification. 5. Cholelithiasis  Carboplatin/ gemzar1 week on and one-week off as patient could not tolerate 2-week on and one week off regimen. Cycle 1 started on 09/22/2017 Disease progression in March 2020. Switched to second [employer]  Patient had disease controlled with Keytruda. However he was noted to have worsening dermatitis with constant flareups requiring steroids. Beryle Flock is therefore being kept on hold and patient will be switched to third line Padcev.  Patient has been getting continued skin rash with Bactrim as well   Interval history-rash over his trunk and upper extremities is slowly improving after giving him a prolonged course of steroid taper.  He will be finishing up his steroids over the next 1 week.  He also sees dermatology and gets topical steroids.  Patient apparently also had a skin biopsy which was not nondiagnostic.  He gets periods of intense itching  ECOG PS- 1 Pain scale- 0  Review of systems- Review of Systems  Constitutional: Positive for malaise/fatigue. Negative for chills, fever and weight loss.  HENT: Negative for congestion, ear discharge and nosebleeds.   Eyes: Negative for blurred vision.  Respiratory: Negative for cough, hemoptysis, sputum production, shortness of breath and wheezing.    Cardiovascular: Negative for chest pain, palpitations, orthopnea  and claudication.  Gastrointestinal: Negative for abdominal pain, blood in stool, constipation, diarrhea, heartburn, melena, nausea and vomiting.  Genitourinary: Negative for dysuria, flank pain, frequency, hematuria and urgency.  Musculoskeletal: Negative for back pain, joint pain and myalgias.  Skin: Positive for rash.  Neurological: Negative for dizziness, tingling, focal weakness, seizures, weakness and headaches.  Endo/Heme/Allergies: Does not bruise/bleed easily.  Psychiatric/Behavioral: Negative for depression and suicidal ideas. The patient does not have insomnia.       Allergies  Allergen Reactions  . Demerol [Meperidine] Nausea And Vomiting  . Lipitor [Atorvastatin] Swelling  . Sulfa Antibiotics Nausea And Vomiting and Rash     Past Medical History:  Diagnosis Date  . Arthritis   . Benign fibroma of prostate 08/23/2013  . BPH (benign prostatic hyperplasia)   . Calculus of kidney 08/23/2013  . Glaucoma    no drops in 3 mo pressure good, pt denies glaucoma, eye pressure has been measuring alright.  . History of kidney stones   . HLD (hyperlipidemia)   . HOH (hard of hearing)    Left Hearing Aid  . HTN (hypertension) 12/26/2014  . Hypertension   . Hyponatremia 12/26/2014  . Migraines    history of migraines when he was younger.  [name] Restless leg syndrome   . Sinus drainage   . Skin cancer   . Urothelial cancer ([place])    chemo tx's.  [name] UTI (lower urinary tract infection) 12/26/2014  . Vertigo      Past Surgical History:  Procedure Laterality Date  . COLONOSCOPY    . CYSTOSCOPY W/ RETROGRADES Right 01/30/2015   Procedure: CYSTOSCOPY WITH RETROGRADE PYELOGRAM;  Surgeon: [name], MD;  Location: [facility];  Service: Urology;  Laterality: Right;  . CYSTOSCOPY W/ RETROGRADES Bilateral 02/26/2016   Procedure: CYSTOSCOPY WITH RETROGRADE PYELOGRAM;  Surgeon: [name], MD;  Location: [facility];   Service: Urology;  Laterality: Bilateral;  . CYSTOSCOPY W/ URETERAL STENT PLACEMENT Right 08/20/2015   Procedure: CYSTOSCOPY WITH RETROGRADE PYELOGRAM/POSSIBLE URETERAL STENT PLACEMENT/BLADDER BIOPSY;  Surgeon: [name], MD;  Location: [facility];  Service: Urology;  Laterality: Right;  . CYSTOSCOPY W/ URETERAL STENT PLACEMENT Right 09/12/2015   Procedure: CYSTOSCOPY WITH STENT REPLACEMENT;  Surgeon: [name], MD;  Location: [facility];  Service: Urology;  Laterality: Right;  . CYSTOSCOPY WITH BIOPSY Right 09/12/2015   Procedure: CYSTOSCOPY WITH BLADDER AND URETERAL BIOPSY;  Surgeon: [name], MD;  Location: [facility];  Service: Urology;  Laterality: Right;  . CYSTOSCOPY WITH STENT PLACEMENT Right 01/30/2015   Procedure: CYSTOSCOPY WITH STENT PLACEMENT;  Surgeon: [name], MD;  Location: [facility];  Service: Urology;  Laterality: Right;  . CYSTOSCOPY WITH STENT PLACEMENT Right 12/28/2017   Procedure: [place] WITH STENT Exchange;  Surgeon: [name], MD;  Location: [facility];  Service: Urology;  Laterality: Right;  . CYSTOSCOPY/URETEROSCOPY/HOLMIUM LASER/STENT PLACEMENT Right 08/19/2017   Procedure: CYSTOSCOPY/URETEROSCOPY/HOLMIUM LASER/STENT PLACEMENT;  Surgeon: [name], MD;  Location: [facility];  Service: Urology;  Laterality: Right;  . EYE SURGERY Bilateral    Cataract Extraction with IOL  . goiter removal    . HOLMIUM LASER APPLICATION N/A 8/41/3244   Procedure:  HOLMIUM LASER APPLICATION;  Surgeon: [name], MD;  Location: [facility];  Service: Urology;  Laterality: N/A;  . PORTA CATH INSERTION N/A 09/23/2017   Procedure: PORTA CATH INSERTION;  Surgeon: Algernon Huxley, MD;  Location: [facility];  Service: Cardiovascular;  Laterality: N/A;  . SPERMATOCELECTOMY    . TONSILLECTOMY    .  TRANSURETHRAL RESECTION OF BLADDER TUMOR N/A 02/26/2016   Procedure: TRANSURETHRAL RESECTION OF BLADDER TUMOR (TURBT);  Surgeon: [name], MD;  Location: [facility];   Service: Urology;  Laterality: N/A;  . TRANSURETHRAL RESECTION OF BLADDER TUMOR WITH MITOMYCIN-C N/A 09/12/2015   Procedure: TRANSURETHRAL RESECTION OF BLADDER TUMOR ;  Surgeon: [name], MD;  Location: [facility];  Service: Urology;  Laterality: N/A;  . TRANSURETHRAL RESECTION OF BLADDER TUMOR WITH MITOMYCIN-C N/A 03/24/2016   Procedure: TRANSURETHRAL RESECTION OF BLADDER TUMOR WITH MITOMYCIN-C  (SMALL);  Surgeon: [name], MD;  Location: [facility];  Service: Urology;  Laterality: N/A;  . URETERAL BIOPSY Right 08/19/2017   Procedure: Renal Mass BIOPSY;  Surgeon: [name], MD;  Location: [facility];  Service: Urology;  Laterality: Right;  . URETEROSCOPY Right 01/30/2015   Procedure: URETEROSCOPY/ WITH BIOPSY AND CYTOLOGY BRUSHING;  Surgeon: [name], MD;  Location: [facility];  Service: Urology;  Laterality: Right;  . URETEROSCOPY Right 08/20/2015   Procedure: URETEROSCOPY;  Surgeon: [name], MD;  Location: [facility];  Service: Urology;  Laterality: Right;  . URETEROSCOPY Right 09/12/2015   Procedure: URETEROSCOPY;  Surgeon: [name], MD;  Location: [facility];  Service: Urology;  Laterality: Right;  . URETEROSCOPY Right 02/26/2016   Procedure: URETEROSCOPY;  Surgeon: [name], MD;  Location: [facility];  Service: Urology;  Laterality: Right;    Social History   Socioeconomic History  . Marital status: Married    Spouse name: Not on file  . Number of children: Not on file  . Years of education: Not on file  . Highest education level: Not on file  Occupational History  . Not on file  Tobacco Use  . Smoking status: Never Smoker  . Smokeless tobacco: Never Used  Substance and Sexual Activity  . Alcohol use: No    Alcohol/week: 0.0 standard drinks  . Drug use: No  . Sexual activity: Not on file  Other Topics Concern  . Not on file  Social History Narrative  . Not on file   Social Determinants of Health   Financial Resource Strain:   . Difficulty of  Paying Living Expenses:   Food Insecurity:   . Worried About Charity fundraiser in the Last Year:   . Arboriculturist in the Last Year:   Transportation Needs:   . Film/video editor (Medical):   [name] Lack of Transportation (Non-Medical):   Physical Activity:   . Days of Exercise per Week:   . Minutes of Exercise per Session:   Stress:   . Feeling of Stress :   Social Connections:   . Frequency of Communication with Friends and Family:   . Frequency of Social Gatherings with Friends and Family:   . Attends Religious Services:   . Active Member of Clubs or Organizations:   . Attends Archivist Meetings:   [name] Marital Status:   Intimate Partner Violence:   . Fear of Current or Ex-Partner:   . Emotionally Abused:   [name] Physically Abused:   . Sexually Abused:     Family History  Problem Relation Age of Onset  . Hypertension Mother   . Hypertension Father   . Prostate cancer Brother   . Kidney disease Neg Hx   . Kidney cancer Neg Hx   . Bladder Cancer Neg Hx      Current Outpatient Medications:  .  acetaminophen (TYLENOL) 500 MG tablet, Take 1,000 mg by mouth daily as needed for moderate  pain. , Disp: , Rfl:  .  benazepril-hydrochlorthiazide (LOTENSIN HCT) 20-12.5 MG tablet, Take 1 tablet by mouth daily. , Disp: , Rfl:  .  camphor-menthol (SARNA) lotion, Apply 1 application topically as needed for itching., Disp: , Rfl:  .  cetirizine (ZYRTEC) 10 MG tablet, Take 10 mg by mouth daily., Disp: , Rfl:  .  diazepam (VALIUM) 5 MG tablet, TAKE 1 TABLET (5 MG TOTAL) BY MOUTH EVERY 12 (TWELVE) HOURS AS NEEDED FOR ANXIETY OR SLEEP, Disp: , Rfl:  .  docusate sodium (COLACE) 100 MG capsule, Take 100 mg by mouth 2 (two) times daily. , Disp: , Rfl:  .  fenofibrate micronized (LOFIBRA) 134 MG capsule, Take 134 mg by mouth daily before breakfast. , Disp: , Rfl:  .  fluticasone (FLONASE) 50 MCG/ACT nasal spray, Place 2 sprays into both nostrils at bedtime., Disp: , Rfl:  .   HYDROcodone-acetaminophen (NORCO/VICODIN) 5-325 MG tablet, Take 1 tablet by mouth every 6 (six) hours as needed for severe pain., Disp: 30 tablet, Rfl: 0 .  hydrOXYzine (ATARAX/VISTARIL) 25 MG tablet, Take 25 mg by mouth 3 (three) times daily as needed., Disp: , Rfl:  .  mirabegron ER (MYRBETRIQ) 50 MG TB24 tablet, Take 1 tablet (50 mg total) by mouth daily., Disp: 30 tablet, Rfl: 11 .  Multiple Vitamin (MULTIVITAMIN WITH MINERALS) TABS tablet, Take 1 tablet by mouth daily., Disp: , Rfl:  .  nystatin (MYCOSTATIN/NYSTOP) powder, APPLY TO AFFECTED AREA 4 TIMES A DAY, Disp: 15 g, Rfl: 0 .  polyethylene glycol (MIRALAX / GLYCOLAX) 17 g packet, Take 17 g by mouth daily., Disp: , Rfl:  .  Potassium 99 MG TABS, Take 99 mg by mouth at bedtime., Disp: , Rfl:  .  predniSONE (DELTASONE) 10 MG tablet, Take 1 tablet (10 mg total) by mouth as directed. With food-Take 5 tablets daily x 4 days, then 4 tablets daily x 4 days, then 3 tablets daily x 4 days , 2 tablets daily x 4 days, then 1 tablet daily x 4 days, then 1/2 tablet daily x 4 days, then stop, Disp: 62 tablet, Rfl: 0 .  psyllium (METAMUCIL) 58.6 % packet, Take 1 packet by mouth daily. , Disp: , Rfl:  .  tamsulosin (FLOMAX) 0.4 MG CAPS capsule, Take 1 capsule (0.4 mg total) by mouth daily., Disp: 90 capsule, Rfl: 3 .  triamcinolone cream (KENALOG) 0.1 %, Apply 1 application topically 2 (two) times daily., Disp: , Rfl:  .  amLODipine (NORVASC) 5 MG tablet, Take 5 mg by mouth daily. , Disp: , Rfl:  No current facility-administered medications for this visit.  Facility-Administered Medications Ordered in Other Visits:  .  sodium chloride flush (NS) 0.9 % injection 10 mL, 10 mL, Intravenous, Once, Sindy Guadeloupe, MD  Physical exam:  Vitals:   06/02/19 0848  BP: 127/78  Pulse: 90  Resp: 16  Temp: (!) 95.6 F (35.3 C)  TempSrc: Tympanic  Weight: 193 lb 4.8 oz (87.7 kg)   Physical Exam HENT:     Head: Normocephalic and atraumatic.  Eyes:      Pupils: Pupils are equal, round, and reactive to light.  Cardiovascular:     Rate and Rhythm: Normal rate and regular rhythm.     Heart sounds: Normal heart sounds.  Pulmonary:     Effort: Pulmonary effort is normal.     Breath sounds: Normal breath sounds.  Abdominal:     General: Bowel sounds are normal.     Palpations: Abdomen  is soft.  Musculoskeletal:     Cervical back: Normal range of motion.  Skin:    Comments: Patient does not have a confluent maculopapular rash that was seen 2 weeks ago.  He still has some persistent areas of erythematous rash which is not as extensive as before  Neurological:     Mental Status: He is alert and oriented to person, place, and time.      CMP Latest Ref Rng & Units 05/17/2019  Glucose 70 - 99 mg/dL 158(H)  BUN 8 - 23 mg/dL 23  Creatinine 0.61 - 1.24 mg/dL 1.15  Sodium 135 - 145 mmol/L 131(L)  Potassium 3.5 - 5.1 mmol/L 3.7  Chloride 98 - 111 mmol/L 95(L)  CO2 22 - 32 mmol/L 26  Calcium 8.9 - 10.3 mg/dL 8.9  Total Protein 6.5 - 8.1 g/dL 6.4(L)  Total Bilirubin 0.3 - 1.2 mg/dL 0.8  Alkaline Phos 38 - 126 U/L 49  AST 15 - 41 U/L 32  ALT 0 - 44 U/L 28   CBC Latest Ref Rng & Units 05/17/2019  WBC 4.0 - 10.5 K/uL 9.6  Hemoglobin 13.0 - 17.0 g/dL 13.5  Hematocrit 39.0 - 52.0 % 40.0  Platelets 150 - 400 K/uL 300    No images are attached to the encounter.  CT Chest W Contrast  Result Date: 05/12/2019 CLINICAL DATA:  86 year old male with history of urothelial carcinoma of the distal ureter. Follow-up study. EXAM: CT CHEST, ABDOMEN, AND PELVIS WITH CONTRAST TECHNIQUE: Multidetector CT imaging of the chest, abdomen and pelvis was performed following the standard protocol during bolus administration of intravenous contrast. CONTRAST:  172mL OMNIPAQUE IOHEXOL 300 MG/ML  SOLN COMPARISON:  CT of the chest, abdomen and pelvis 12/14/2018. FINDINGS: CT CHEST FINDINGS Cardiovascular: Heart size is normal. There is no significant pericardial fluid,  thickening or pericardial calcification. There is aortic atherosclerosis, as well as atherosclerosis of the great vessels of the mediastinum and the coronary arteries, including calcified atherosclerotic plaque in the left main, left anterior descending, left circumflex and right coronary arteries. Calcifications of the aortic valve. Right single-lumen porta cath with tip terminating in the distal superior vena cava. Mediastinum/Nodes: No pathologically enlarged mediastinal or hilar lymph nodes. Esophagus is unremarkable in appearance. No axillary lymphadenopathy. Lungs/Pleura: Multiple small pulmonary nodules appear stable in size, number and distribution. The largest of these is in the medial aspect of the right upper lobe (axial image 81 of series 3)where there is a smoothly marginated 1.4 x 1.0 cm nodule (mean diameter of 1.2 cm). No other new suspicious appearing pulmonary nodules or masses are noted. No acute consolidative airspace disease. No pleural effusions. Musculoskeletal: There are no aggressive appearing lytic or blastic lesions noted in the visualized portions of the skeleton. CT ABDOMEN PELVIS FINDINGS Hepatobiliary: No suspicious cystic or solid hepatic lesions. No intra or extrahepatic biliary ductal dilatation. Gallbladder is normal in appearance. Pancreas: 1.6 x 1.1 cm low-attenuation lesion in the inferior aspect of the uncinate process, stable compared to the prior study. No other pancreatic mass. No pancreatic ductal dilatation. No peripancreatic fluid collections or inflammatory changes. Spleen: Unremarkable. Adrenals/Urinary Tract: Exophytic 4.3 cm low-attenuation lesion in the lower pole the right kidney, compatible with a simple cyst. Subcentimeter low-attenuation lesion in the upper pole the left kidney, too small to characterize, but statistically likely to represent a tiny cyst. No hydroureteronephrosis. Small bladder diverticulum posterior to the left ureterovesicular junction  measuring 1.6 cm. Urinary bladder is otherwise normal in appearance. Stomach/Bowel: Normal appearance of  the stomach. No pathologic dilatation of small bowel or colon. Numerous colonic diverticulae are noted, particularly in the sigmoid colon, without surrounding inflammatory changes to suggest an acute diverticulitis at this time. Normal appendix. Vascular/Lymphatic: Aortic atherosclerosis, without evidence of aneurysm or dissection in the abdominal or pelvic vasculature. No lymphadenopathy noted in the abdomen or pelvis. Reproductive: Prostate gland and seminal vesicles are unremarkable in appearance. Other: No significant volume of ascites.  No pneumoperitoneum. Musculoskeletal: There are no aggressive appearing lytic or blastic lesions noted in the visualized portions of the skeleton. IMPRESSION: 1. All previously noted pulmonary nodules appear stable in size and number compared to prior examinations. 2. No signs of metastatic disease in the abdomen or pelvis. 3. Small cystic lesion in the inferior aspect of the uncinate process of the pancreas measuring 1.6 x 1.1 cm, stable. No definite communication with the main pancreatic duct. This is favored to represent a small benign lesions such as a pancreatic pseudocyst or less likely a side branch IPMN. Continued attention on follow-up studies is recommended to ensure continued stability. 4. Colonic diverticulosis without evidence of acute diverticulitis at this time. 5. Aortic atherosclerosis, in addition to left main and 3 vessel coronary artery disease. 6. There are calcifications of the aortic valve. Echocardiographic correlation for evaluation of potential valvular dysfunction may be warranted if clinically indicated. Electronically Signed   By: Vinnie Langton M.D.   On: 05/12/2019 11:50   CT Abdomen Pelvis W Contrast  Result Date: 05/12/2019 CLINICAL DATA:  86 year old male with history of urothelial carcinoma of the distal ureter. Follow-up study. EXAM:  CT CHEST, ABDOMEN, AND PELVIS WITH CONTRAST TECHNIQUE: Multidetector CT imaging of the chest, abdomen and pelvis was performed following the standard protocol during bolus administration of intravenous contrast. CONTRAST:  123mL OMNIPAQUE IOHEXOL 300 MG/ML  SOLN COMPARISON:  CT of the chest, abdomen and pelvis 12/14/2018. FINDINGS: CT CHEST FINDINGS Cardiovascular: Heart size is normal. There is no significant pericardial fluid, thickening or pericardial calcification. There is aortic atherosclerosis, as well as atherosclerosis of the great vessels of the mediastinum and the coronary arteries, including calcified atherosclerotic plaque in the left main, left anterior descending, left circumflex and right coronary arteries. Calcifications of the aortic valve. Right single-lumen porta cath with tip terminating in the distal superior vena cava. Mediastinum/Nodes: No pathologically enlarged mediastinal or hilar lymph nodes. Esophagus is unremarkable in appearance. No axillary lymphadenopathy. Lungs/Pleura: Multiple small pulmonary nodules appear stable in size, number and distribution. The largest of these is in the medial aspect of the right upper lobe (axial image 81 of series 3)where there is a smoothly marginated 1.4 x 1.0 cm nodule (mean diameter of 1.2 cm). No other new suspicious appearing pulmonary nodules or masses are noted. No acute consolidative airspace disease. No pleural effusions. Musculoskeletal: There are no aggressive appearing lytic or blastic lesions noted in the visualized portions of the skeleton. CT ABDOMEN PELVIS FINDINGS Hepatobiliary: No suspicious cystic or solid hepatic lesions. No intra or extrahepatic biliary ductal dilatation. Gallbladder is normal in appearance. Pancreas: 1.6 x 1.1 cm low-attenuation lesion in the inferior aspect of the uncinate process, stable compared to the prior study. No other pancreatic mass. No pancreatic ductal dilatation. No peripancreatic fluid collections or  inflammatory changes. Spleen: Unremarkable. Adrenals/Urinary Tract: Exophytic 4.3 cm low-attenuation lesion in the lower pole the right kidney, compatible with a simple cyst. Subcentimeter low-attenuation lesion in the upper pole the left kidney, too small to characterize, but statistically likely to represent a tiny cyst. No  hydroureteronephrosis. Small bladder diverticulum posterior to the left ureterovesicular junction measuring 1.6 cm. Urinary bladder is otherwise normal in appearance. Stomach/Bowel: Normal appearance of the stomach. No pathologic dilatation of small bowel or colon. Numerous colonic diverticulae are noted, particularly in the sigmoid colon, without surrounding inflammatory changes to suggest an acute diverticulitis at this time. Normal appendix. Vascular/Lymphatic: Aortic atherosclerosis, without evidence of aneurysm or dissection in the abdominal or pelvic vasculature. No lymphadenopathy noted in the abdomen or pelvis. Reproductive: Prostate gland and seminal vesicles are unremarkable in appearance. Other: No significant volume of ascites.  No pneumoperitoneum. Musculoskeletal: There are no aggressive appearing lytic or blastic lesions noted in the visualized portions of the skeleton. IMPRESSION: 1. All previously noted pulmonary nodules appear stable in size and number compared to prior examinations. 2. No signs of metastatic disease in the abdomen or pelvis. 3. Small cystic lesion in the inferior aspect of the uncinate process of the pancreas measuring 1.6 x 1.1 cm, stable. No definite communication with the main pancreatic duct. This is favored to represent a small benign lesions such as a pancreatic pseudocyst or less likely a side branch IPMN. Continued attention on follow-up studies is recommended to ensure continued stability. 4. Colonic diverticulosis without evidence of acute diverticulitis at this time. 5. Aortic atherosclerosis, in addition to left main and 3 vessel coronary artery  disease. 6. There are calcifications of the aortic valve. Echocardiographic correlation for evaluation of potential valvular dysfunction may be warranted if clinically indicated. Electronically Signed   By: Vinnie Langton M.D.   On: 05/12/2019 11:50     Assessment and plan- Patient is a 86 y.o. male with metastatic urothelial carcinoma with metastases to the lymph nodes.  He is currently on third line treatment with Padcev and here for ongoing follow-up of skin rash  Drug-induced skin rash: Likely secondary to Padcev.  He is getting more frequent episodes of exacerbations and he is just coming off a steroid taper which she will finish over the next 1 week.  He also follows up with dermatology and is using topical steroids.  Recently had skin biopsy which also did not show any malignancy and was inconclusive.  We had to stop Keytruda for the same reason.  Given his frequent episodes of exacerbation of drug rash, I will hold off on giving him Padcev and offer a treatment break for the next 6 weeks.  I will plan to get repeat CT chest abdomen pelvis in 6 weeks time and I will see him after that to consider possibly restarting Paxil treatment.  Overall he has responded to Paxil.  And he has low-volume disease in his CT scans mainly small bilateral lung nodules.   Visit Diagnosis 1. 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 Drug-induced skin rash      Dr. Randa Evens, MD, MPH [facility] at [facility] [phone] 06/03/2019 2:57 PM

## 2019-06-13 ENCOUNTER — Telehealth: Payer: Self-pay | Admitting: *Deleted

## 2019-06-13 ENCOUNTER — Inpatient Hospital Stay: Payer: PPO | Attending: Nurse Practitioner | Admitting: Nurse Practitioner

## 2019-06-13 ENCOUNTER — Other Ambulatory Visit: Payer: Self-pay

## 2019-06-13 VITALS — BP 122/83 | HR 91 | Temp 97.9°F | Resp 18 | Wt 192.0 lb

## 2019-06-13 DIAGNOSIS — I700 Atherosclerosis of aorta: Secondary | ICD-10-CM | POA: Insufficient documentation

## 2019-06-13 DIAGNOSIS — M1990 Unspecified osteoarthritis, unspecified site: Secondary | ICD-10-CM | POA: Diagnosis not present

## 2019-06-13 DIAGNOSIS — Z85828 Personal history of other malignant neoplasm of skin: Secondary | ICD-10-CM | POA: Insufficient documentation

## 2019-06-13 DIAGNOSIS — I2510 Atherosclerotic heart disease of native coronary artery without angina pectoris: Secondary | ICD-10-CM | POA: Insufficient documentation

## 2019-06-13 DIAGNOSIS — Z882 Allergy status to sulfonamides status: Secondary | ICD-10-CM | POA: Insufficient documentation

## 2019-06-13 DIAGNOSIS — R239 Unspecified skin changes: Secondary | ICD-10-CM | POA: Diagnosis not present

## 2019-06-13 DIAGNOSIS — N400 Enlarged prostate without lower urinary tract symptoms: Secondary | ICD-10-CM | POA: Diagnosis not present

## 2019-06-13 DIAGNOSIS — Z8249 Family history of ischemic heart disease and other diseases of the circulatory system: Secondary | ICD-10-CM | POA: Insufficient documentation

## 2019-06-13 DIAGNOSIS — Z885 Allergy status to narcotic agent status: Secondary | ICD-10-CM | POA: Insufficient documentation

## 2019-06-13 DIAGNOSIS — Z79899 Other long term (current) drug therapy: Secondary | ICD-10-CM | POA: Insufficient documentation

## 2019-06-13 DIAGNOSIS — R21 Rash and other nonspecific skin eruption: Secondary | ICD-10-CM | POA: Insufficient documentation

## 2019-06-13 DIAGNOSIS — L309 Dermatitis, unspecified: Secondary | ICD-10-CM | POA: Insufficient documentation

## 2019-06-13 DIAGNOSIS — T451X5A Adverse effect of antineoplastic and immunosuppressive drugs, initial encounter: Secondary | ICD-10-CM

## 2019-06-13 DIAGNOSIS — Z8042 Family history of malignant neoplasm of prostate: Secondary | ICD-10-CM | POA: Insufficient documentation

## 2019-06-13 DIAGNOSIS — C779 Secondary and unspecified malignant neoplasm of lymph node, unspecified: Secondary | ICD-10-CM | POA: Insufficient documentation

## 2019-06-13 DIAGNOSIS — C669 Malignant neoplasm of unspecified ureter: Secondary | ICD-10-CM | POA: Diagnosis not present

## 2019-06-13 DIAGNOSIS — N179 Acute kidney failure, unspecified: Secondary | ICD-10-CM | POA: Insufficient documentation

## 2019-06-13 DIAGNOSIS — R911 Solitary pulmonary nodule: Secondary | ICD-10-CM | POA: Insufficient documentation

## 2019-06-13 DIAGNOSIS — Z87442 Personal history of urinary calculi: Secondary | ICD-10-CM | POA: Insufficient documentation

## 2019-06-13 MED ORDER — PREDNISONE 10 MG PO TABS: 20.0000 mg | ORAL_TABLET | Freq: Every day | ORAL | 0 refills | Status: DC

## 2019-06-13 MED ORDER — DOXYCYCLINE HYCLATE 100 MG PO TABS: 100.0000 mg | ORAL_TABLET | Freq: Two times a day (BID) | ORAL | 0 refills | Status: AC

## 2019-06-13 NOTE — Telephone Encounter (Signed)
He is coming in at 1:30 today to see Lauren.

## 2019-06-13 NOTE — Telephone Encounter (Signed)
Can one of you see him and touch base with dermatology as well?

## 2019-06-13 NOTE — Progress Notes (Signed)
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[place])  09/15/2017 Cancer Staging   Staging form: Renal Pelvis and Ureter, AJCC 8th Edition - Clinical stage from 09/15/2017: Stage IV (cT2, cN0, cM1) - Signed by Sindy Guadeloupe, MD on 09/17/2017   09/17/2017 Initial Diagnosis   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[place])   09/22/2017 - 06/07/2018 Chemotherapy   The patient had palonosetron (ALOXI) injection 0.25 mg, 0.25 mg, Intravenous,  Once, 9 of 10 cycles Administration: 0.25 mg (09/22/2017), 0.25 mg (10/06/2017), 0.25 mg (10/20/2017), 0.25 mg (11/03/2017), 0.25 mg (11/17/2017), 0.25 mg (12/01/2017), 0.25 mg (12/15/2017), 0.25 mg (12/29/2017), 0.25 mg (01/12/2018), 0.25 mg (01/26/2018), 0.25 mg (02/09/2018), 0.25 mg (02/23/2018), 0.25 mg (03/16/2018), 0.25 mg (04/13/2018), 0.25 mg (05/11/2018) pegfilgrastim-cbqv (UDENYCA) injection 6 mg, 6 mg, Subcutaneous, Once, 5 of 6 cycles Administration: 6 mg (01/13/2018), 6 mg (02/10/2018), 6 mg (03/17/2018), 6 mg (04/15/2018), 6 mg (05/12/2018), 6 mg (05/26/2018) CARBOplatin (PARAPLATIN) 160 mg in sodium chloride 0.9 % 250 mL chemo infusion, 160 mg (100 % of original dose 163.2 mg), Intravenous,  Once, 9 of 10 cycles Dose modification:   (original dose 163.2 mg, Cycle 1) Administration: 160 mg (09/22/2017), 170 mg (10/06/2017), 170 mg (10/20/2017), 170 mg (11/17/2017), 160 mg (12/01/2017),  170 mg (11/03/2017), 160 mg (12/15/2017), 160 mg (12/29/2017), 180 mg (01/12/2018), 180 mg (01/26/2018), 160 mg (02/09/2018), 160 mg (02/23/2018), 160 mg (03/16/2018), 160 mg (03/30/2018), 160 mg (04/13/2018), 180 mg (04/27/2018), 180 mg (05/11/2018), 160 mg (05/25/2018) gemcitabine (GEMZAR) 1,600 mg in sodium chloride 0.9 % 250 mL chemo infusion, 1,672 mg (100 % of original dose 800 mg/m2), Intravenous,  Once, 9 of 10 cycles Dose modification: 800 mg/m2 (original dose 800 mg/m2, Cycle 1, Reason: Patient Age) Administration: 1,600 mg (09/22/2017), 1,600 mg (10/06/2017), 1,600 mg (10/20/2017), 1,600 mg (11/03/2017), 1,600 mg (11/17/2017), 1,600 mg (12/01/2017), 1,600 mg (12/15/2017), 1,600 mg (12/29/2017), 1,600 mg (01/12/2018), 1,600 mg (01/26/2018), 1,600 mg (02/09/2018), 1,600 mg (02/23/2018), 1,600 mg (03/16/2018), 1,600 mg (03/30/2018), 1,600 mg (04/13/2018), 1,600 mg (04/27/2018), 1,600 mg (05/11/2018), 1,600 mg (05/25/2018)  for chemotherapy treatment.    06/08/2018 - 01/10/2019 Chemotherapy   The patient had pembrolizumab (KEYTRUDA) 200 mg in sodium chloride 0.9 % 50 mL chemo infusion, 200 mg, Intravenous, Once, 10 of 11 cycles Administration: 200 mg (06/08/2018), 200 mg (06/29/2018), 200 mg (07/20/2018), 200 mg (08/13/2018), 200 mg (09/03/2018), 200 mg (09/24/2018), 200 mg (10/19/2018), 200 mg (11/09/2018), 200 mg (11/30/2018), 200 mg (12/21/2018)  for chemotherapy treatment.    02/15/2019 -  Chemotherapy   The patient had palonosetron (ALOXI) injection 0.25 mg, 0.25 mg, Intravenous,  Once, 4 of 6 cycles Administration: 0.25 mg (02/15/2019), 0.25 mg (02/22/2019), 0.25 mg (03/01/2019), 0.25 mg (03/18/2019), 0.25 mg (03/25/2019), 0.25 mg (04/05/2019), 0.25 mg (04/26/2019), 0.25 mg (05/03/2019) enfortumab vedotin-ejfv (PADCEV) 90 mg in sodium chloride 0.9 % 50 mL (1.5254 mg/mL) chemo infusion, 89 mg (100 % of original dose 1 mg/kg), Intravenous,  Once, 4 of 6 cycles Dose modification: 1 mg/kg (  original dose 1 mg/kg, Cycle 1, Reason: Patient  Age) Administration: 90 mg (02/15/2019), 90 mg (02/22/2019), 90 mg (03/01/2019), 90 mg (03/18/2019), 90 mg (03/25/2019), 90 mg (04/05/2019), 90 mg (04/26/2019), 90 mg (05/03/2019)  for chemotherapy treatment.      Interval history- Anthony Gonzales, 86 year old male diagnosed with metastatic urothelial carcinoma most recently on third line Padcev  He suffered dermatitis with constant flareups requiring steroids with Keytruda (06/08/2018-12/21/2018) and was switched to third line Padcev since December 2020.  Last Padcev on 05/03/2019. He has been on treatment break due to rash. He has been seen for this issue several times in the past and has been seen by dermatology for same. He has tried steroids which resolve his symptoms though symptoms recur when he stops medication. He has tried topical triamcinolone which did not improve his symptoms. He takes atarax at night for pruritis. No benefit from claritin.   Most recently, patient completed steroid taper last week. He says that once he got below 2 tablets, lesions of rash began to reappear and have progressively worsened since that time. He's been off steroids for a week and now rash and pruritis quite bothersome.   Review of systems- Review of Systems  Constitutional: Negative for chills, fever, malaise/fatigue and weight loss.  HENT: Negative for hearing loss, nosebleeds, sore throat and tinnitus.   Eyes: Negative for blurred vision and double vision.  Respiratory: Negative for cough, hemoptysis, shortness of breath and wheezing.   Cardiovascular: Negative for chest pain, palpitations and leg swelling.  Gastrointestinal: Negative for abdominal pain, blood in stool, constipation, diarrhea, melena, nausea and vomiting.  Genitourinary: Negative for dysuria and urgency.  Musculoskeletal: Negative for back pain, falls, joint pain and myalgias.  Skin: Positive for itching and rash.  Neurological: Negative for dizziness, tingling, sensory change, loss of  consciousness, weakness and headaches.  Endo/Heme/Allergies: Negative for environmental allergies. Does not bruise/bleed easily.  Psychiatric/Behavioral: Negative for depression. The patient is not nervous/anxious and does not have insomnia.     Current treatment- Padcev (on hold)  Allergies  Allergen Reactions  . Demerol [Meperidine] Nausea And Vomiting  . Lipitor [Atorvastatin] Swelling  . Sulfa Antibiotics Nausea And Vomiting and Rash    Past Medical History:  Diagnosis Date  . Arthritis   . Benign fibroma of prostate 08/23/2013  . BPH (benign prostatic hyperplasia)   . Calculus of kidney 08/23/2013  . Glaucoma    no drops in 3 mo pressure good, pt denies glaucoma, eye pressure has been measuring alright.  . History of kidney stones   . HLD (hyperlipidemia)   . HOH (hard of hearing)    Left Hearing Aid  . HTN (hypertension) 12/26/2014  . Hypertension   . Hyponatremia 12/26/2014  . Migraines    history of migraines when he was younger.  [name] Restless leg syndrome   . Sinus drainage   . Skin cancer   . Urothelial cancer ([place])    chemo tx's.  [name] UTI (lower urinary tract infection) 12/26/2014  . Vertigo     Past Surgical History:  Procedure Laterality Date  . COLONOSCOPY    . CYSTOSCOPY W/ RETROGRADES Right 01/30/2015   Procedure: CYSTOSCOPY WITH RETROGRADE PYELOGRAM;  Surgeon: [name], MD;  Location: [facility];  Service: Urology;  Laterality: Right;  . CYSTOSCOPY W/ RETROGRADES Bilateral 02/26/2016   Procedure: CYSTOSCOPY WITH RETROGRADE PYELOGRAM;  Surgeon: [name], MD;  Location: [facility];  Service: Urology;  Laterality: Bilateral;  . CYSTOSCOPY W/ URETERAL STENT PLACEMENT Right  08/20/2015   Procedure: CYSTOSCOPY WITH RETROGRADE PYELOGRAM/POSSIBLE URETERAL STENT PLACEMENT/BLADDER BIOPSY;  Surgeon: [name], MD;  Location: [facility];  Service: Urology;  Laterality: Right;  . CYSTOSCOPY W/ URETERAL STENT PLACEMENT Right 09/12/2015   Procedure: CYSTOSCOPY WITH  STENT REPLACEMENT;  Surgeon: [name], MD;  Location: [facility];  Service: Urology;  Laterality: Right;  . CYSTOSCOPY WITH BIOPSY Right 09/12/2015   Procedure: CYSTOSCOPY WITH BLADDER AND URETERAL BIOPSY;  Surgeon: [name], MD;  Location: [facility];  Service: Urology;  Laterality: Right;  . CYSTOSCOPY WITH STENT PLACEMENT Right 01/30/2015   Procedure: CYSTOSCOPY WITH STENT PLACEMENT;  Surgeon: [name], MD;  Location: [facility];  Service: Urology;  Laterality: Right;  . CYSTOSCOPY WITH STENT PLACEMENT Right 12/28/2017   Procedure: [place] WITH STENT Exchange;  Surgeon: [name], MD;  Location: [facility];  Service: Urology;  Laterality: Right;  . CYSTOSCOPY/URETEROSCOPY/HOLMIUM LASER/STENT PLACEMENT Right 08/19/2017   Procedure: CYSTOSCOPY/URETEROSCOPY/HOLMIUM LASER/STENT PLACEMENT;  Surgeon: [name], MD;  Location: [facility];  Service: Urology;  Laterality: Right;  . EYE SURGERY Bilateral    Cataract Extraction with IOL  . goiter removal    . HOLMIUM LASER APPLICATION N/A 03/28/4172   Procedure:  HOLMIUM LASER APPLICATION;  Surgeon: [name], MD;  Location: [facility];  Service: Urology;  Laterality: N/A;  . PORTA CATH INSERTION N/A 09/23/2017   Procedure: PORTA CATH INSERTION;  Surgeon: Algernon Huxley, MD;  Location: [facility];  Service: Cardiovascular;  Laterality: N/A;  . SPERMATOCELECTOMY    . TONSILLECTOMY    . TRANSURETHRAL RESECTION OF BLADDER TUMOR N/A 02/26/2016   Procedure: TRANSURETHRAL RESECTION OF BLADDER TUMOR (TURBT);  Surgeon: [name], MD;  Location: [facility];  Service: Urology;  Laterality: N/A;  . TRANSURETHRAL RESECTION OF BLADDER TUMOR WITH MITOMYCIN-C N/A 09/12/2015   Procedure: TRANSURETHRAL RESECTION OF BLADDER TUMOR ;  Surgeon: [name], MD;  Location: [facility];  Service: Urology;  Laterality: N/A;  . TRANSURETHRAL RESECTION OF BLADDER TUMOR WITH MITOMYCIN-C N/A 03/24/2016   Procedure: TRANSURETHRAL RESECTION OF BLADDER  TUMOR WITH MITOMYCIN-C  (SMALL);  Surgeon: [name], MD;  Location: [facility];  Service: Urology;  Laterality: N/A;  . URETERAL BIOPSY Right 08/19/2017   Procedure: Renal Mass BIOPSY;  Surgeon: [name], MD;  Location: [facility];  Service: Urology;  Laterality: Right;  . URETEROSCOPY Right 01/30/2015   Procedure: URETEROSCOPY/ WITH BIOPSY AND CYTOLOGY BRUSHING;  Surgeon: [name], MD;  Location: [facility];  Service: Urology;  Laterality: Right;  . URETEROSCOPY Right 08/20/2015   Procedure: URETEROSCOPY;  Surgeon: [name], MD;  Location: [facility];  Service: Urology;  Laterality: Right;  . URETEROSCOPY Right 09/12/2015   Procedure: URETEROSCOPY;  Surgeon: [name], MD;  Location: [facility];  Service: Urology;  Laterality: Right;  . URETEROSCOPY Right 02/26/2016   Procedure: URETEROSCOPY;  Surgeon: [name], MD;  Location: [facility];  Service: Urology;  Laterality: Right;    Social History   Socioeconomic History  . Marital status: Married    Spouse name: Not on file  . Number of children: Not on file  . Years of education: Not on file  . Highest education level: Not on file  Occupational History  . Not on file  Tobacco Use  . Smoking status: Never Smoker  . Smokeless tobacco: Never Used  Substance and Sexual Activity  . Alcohol use: No    Alcohol/week: 0.0 standard drinks  . Drug use: No  . Sexual activity: Not on file  Other Topics Concern  . Not on file  Social History Narrative  . Not on file   Social Determinants of Health   Financial Resource Strain:   . Difficulty of Paying Living Expenses:   Food Insecurity:   . Worried About Charity fundraiser in the Last Year:   . Arboriculturist in the Last Year:   Transportation Needs:   . Film/video editor (Medical):   [name] Lack of Transportation (Non-Medical):   Physical Activity:   . Days of Exercise per Week:   . Minutes of Exercise per Session:   Stress:   . Feeling of Stress :   Social  Connections:   . Frequency of Communication with Friends and Family:   . Frequency of Social Gatherings with Friends and Family:   . Attends Religious Services:   . Active Member of Clubs or Organizations:   . Attends Archivist Meetings:   [name] Marital Status:   Intimate Partner Violence:   . Fear of Current or Ex-Partner:   . Emotionally Abused:   [name] Physically Abused:   . Sexually Abused:     Family History  Problem Relation Age of Onset  . Hypertension Mother   . Hypertension Father   . Prostate cancer Brother   . Kidney disease Neg Hx   . Kidney cancer Neg Hx   . Bladder Cancer Neg Hx      Current Outpatient Medications:  .  acetaminophen (TYLENOL) 500 MG tablet, Take 1,000 mg by mouth daily as needed for moderate pain. , Disp: , Rfl:  .  amLODipine (NORVASC) 5 MG tablet, Take 5 mg by mouth daily. , Disp: , Rfl:  .  benazepril-hydrochlorthiazide (LOTENSIN HCT) 20-12.5 MG tablet, Take 1 tablet by mouth daily. , Disp: , Rfl:  .  camphor-menthol (SARNA) lotion, Apply 1 application topically as needed for itching., Disp: , Rfl:  .  cetirizine (ZYRTEC) 10 MG tablet, Take 10 mg by mouth daily., Disp: , Rfl:  .  diazepam (VALIUM) 5 MG tablet, TAKE 1 TABLET (5 MG TOTAL) BY MOUTH EVERY 12 (TWELVE) HOURS AS NEEDED FOR ANXIETY OR SLEEP, Disp: , Rfl:  .  docusate sodium (COLACE) 100 MG capsule, Take 100 mg by mouth 2 (two) times daily. , Disp: , Rfl:  .  fenofibrate micronized (LOFIBRA) 134 MG capsule, Take 134 mg by mouth daily before breakfast. , Disp: , Rfl:  .  fluticasone (FLONASE) 50 MCG/ACT nasal spray, Place 2 sprays into both nostrils at bedtime., Disp: , Rfl:  .  HYDROcodone-acetaminophen (NORCO/VICODIN) 5-325 MG tablet, Take 1 tablet by mouth every 6 (six) hours as needed for severe pain., Disp: 30 tablet, Rfl: 0 .  hydrOXYzine (ATARAX/VISTARIL) 25 MG tablet, Take 25 mg by mouth 3 (three) times daily as needed., Disp: , Rfl:  .  mirabegron ER (MYRBETRIQ) 50 MG TB24  tablet, Take 1 tablet (50 mg total) by mouth daily., Disp: 30 tablet, Rfl: 11 .  Multiple Vitamin (MULTIVITAMIN WITH MINERALS) TABS tablet, Take 1 tablet by mouth daily., Disp: , Rfl:  .  nystatin (MYCOSTATIN/NYSTOP) powder, APPLY TO AFFECTED AREA 4 TIMES A DAY, Disp: 15 g, Rfl: 0 .  polyethylene glycol (MIRALAX / GLYCOLAX) 17 g packet, Take 17 g by mouth daily., Disp: , Rfl:  .  Potassium 99 MG TABS, Take 99 mg by mouth at bedtime., Disp: , Rfl:  .  predniSONE (DELTASONE) 10 MG tablet, Take 1 tablet (10 mg total) by mouth as directed.  With food-Take 5 tablets daily x 4 days, then 4 tablets daily x 4 days, then 3 tablets daily x 4 days , 2 tablets daily x 4 days, then 1 tablet daily x 4 days, then 1/2 tablet daily x 4 days, then stop, Disp: 62 tablet, Rfl: 0 .  psyllium (METAMUCIL) 58.6 % packet, Take 1 packet by mouth daily. , Disp: , Rfl:  .  tamsulosin (FLOMAX) 0.4 MG CAPS capsule, Take 1 capsule (0.4 mg total) by mouth daily., Disp: 90 capsule, Rfl: 3 .  triamcinolone cream (KENALOG) 0.1 %, Apply 1 application topically 2 (two) times daily., Disp: , Rfl:  No current facility-administered medications for this visit.  Facility-Administered Medications Ordered in Other Visits:  .  sodium chloride flush (NS) 0.9 % injection 10 mL, 10 mL, Intravenous, Once, Sindy Guadeloupe, MD  Physical exam:  Vitals:   06/13/19 1349  BP: 122/83  Pulse: 91  Resp: 18  Temp: 97.9 F (36.6 C)  TempSrc: Tympanic  Weight: 192 lb (87.1 kg)   Physical Exam Constitutional:      General: He is not in acute distress. Eyes:     General: No scleral icterus.    Conjunctiva/sclera: Conjunctivae normal.  Pulmonary:     Effort: Pulmonary effort is normal. No respiratory distress.  Skin:    Findings: Rash (macular rash in clustered lesions across abdomen, flanks and back. Appears similar to previous keytruda rash. ) present.  Neurological:     Mental Status: He is alert and oriented to person, place, and time.    Psychiatric:        Mood and Affect: Mood normal.        Behavior: Behavior normal.      CMP Latest Ref Rng & Units 05/17/2019  Glucose 70 - 99 mg/dL 158(H)  BUN 8 - 23 mg/dL 23  Creatinine 0.61 - 1.24 mg/dL 1.15  Sodium 135 - 145 mmol/L 131(L)  Potassium 3.5 - 5.1 mmol/L 3.7  Chloride 98 - 111 mmol/L 95(L)  CO2 22 - 32 mmol/L 26  Calcium 8.9 - 10.3 mg/dL 8.9  Total Protein 6.5 - 8.1 g/dL 6.4(L)  Total Bilirubin 0.3 - 1.2 mg/dL 0.8  Alkaline Phos 38 - 126 U/L 49  AST 15 - 41 U/L 32  ALT 0 - 44 U/L 28   CBC Latest Ref Rng & Units 05/17/2019  WBC 4.0 - 10.5 K/uL 9.6  Hemoglobin 13.0 - 17.0 g/dL 13.5  Hematocrit 39.0 - 52.0 % 40.0  Platelets 150 - 400 K/uL 300    No images are attached to the encounter.  No results found.  Assessment and plan- Patient is a 86 y.o. male diagnosed with urothelial carcinoma currently on third line Padcev who presents to symptom management clinic for skin changes secondary to chemotherapy.  1. Rash- secondary to [place] +/- padcev most likely though not definitive as patient's rash has not resolved with break from treatment. Has been controlled with steroids but recurs. Discussed with Dr. Janese Banks who recommends further evaluation and input from dermatology. Appreciate their input. Patient scheduled to see Dr. Evorn Gong on Wednesday. Will restart prednisone 20 mg daily x 7 days with goal of using lowest dose possible then weaning. Start doxycycline 100 mg bid x 7 days. Continue topicals for pruritis along with antihistamines.   Disposition:  Follow up with dermatology RTC in one week for re-evaluation.    Visit Diagnosis 1. Skin changes related to chemotherapy     Patient expressed understanding and was in  agreement with this plan. He also understands that He can call clinic at any time with any questions, concerns, or complaints.   Thank you for allowing me to participate in the care of this very pleasant patient.   Beckey Rutter, DNP, AGNP-C [facility] [place]  CC: Dr. Evorn Gong

## 2019-06-13 NOTE — Telephone Encounter (Signed)
I will call him now.

## 2019-06-13 NOTE — Telephone Encounter (Signed)
Patient called reporting that his rash is coming back and that he would like someone to lok at it. Please advise

## 2019-06-14 ENCOUNTER — Ambulatory Visit: Payer: PPO | Admitting: Oncology

## 2019-06-14 ENCOUNTER — Ambulatory Visit: Payer: PPO

## 2019-06-14 ENCOUNTER — Other Ambulatory Visit: Payer: PPO

## 2019-06-16 DIAGNOSIS — C44629 Squamous cell carcinoma of skin of left upper limb, including shoulder: Secondary | ICD-10-CM | POA: Diagnosis not present

## 2019-06-20 ENCOUNTER — Other Ambulatory Visit: Payer: Self-pay

## 2019-06-20 ENCOUNTER — Telehealth: Payer: Self-pay | Admitting: Nurse Practitioner

## 2019-06-20 ENCOUNTER — Inpatient Hospital Stay (HOSPITAL_BASED_OUTPATIENT_CLINIC_OR_DEPARTMENT_OTHER): Payer: PPO | Admitting: Nurse Practitioner

## 2019-06-20 VITALS — BP 145/84 | HR 79 | Temp 97.8°F | Resp 16

## 2019-06-20 DIAGNOSIS — R239 Unspecified skin changes: Secondary | ICD-10-CM

## 2019-06-20 DIAGNOSIS — T451X5A Adverse effect of antineoplastic and immunosuppressive drugs, initial encounter: Secondary | ICD-10-CM

## 2019-06-20 DIAGNOSIS — C669 Malignant neoplasm of unspecified ureter: Secondary | ICD-10-CM | POA: Diagnosis not present

## 2019-06-20 MED ORDER — PREDNISONE 10 MG PO TABS: 20.0000 mg | ORAL_TABLET | Freq: Every day | ORAL | 0 refills | Status: DC

## 2019-06-20 NOTE — Telephone Encounter (Signed)
Discussed with Dr. Janese Banks who agrees with Dr. Roosvelt Harps recommendation for methotrexate. Dr. Evorn Gong recommends continuing prednisone in the interim. New prescription sent to pharmacy. He will see patient back next week to perform biopsy and start methotexate and begin weaning of steroids at that time. Baseline labs for patient were reviewed with Dr. Evorn Gong. We will continue to monitor labs two weeks later then every 3-4 months thereafter with cbc, cmp. Will plan to see patient back in 2 weeks for follow-up with Dr. Janese Banks.  I called patient to discuss. Left message to return call.

## 2019-06-20 NOTE — Progress Notes (Signed)
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25 mg (12/29/2017), 0.25 mg (01/12/2018), 0.25 mg (01/26/2018), 0.25 mg (02/09/2018), 0.25 mg (02/23/2018), 0.25 mg (03/16/2018), 0.25 mg (04/13/2018), 0.25 mg (05/11/2018) pegfilgrastim-cbqv (UDENYCA) injection 6 mg, 6 mg, Subcutaneous, Once, 5 of 6 cycles Administration: 6 mg (01/13/2018), 6 mg (02/10/2018), 6 mg (03/17/2018), 6 mg (04/15/2018), 6 mg (05/12/2018), 6 mg (05/26/2018) CARBOplatin (PARAPLATIN) 160 mg in sodium chloride 0.9 % 250 mL chemo infusion, 160 mg (100 % of original dose 163.2 mg), Intravenous,  Once, 9 of 10 cycles Dose modification:   (original dose 163.2 mg, Cycle 1) Administration: 160 mg (09/22/2017), 170 mg (10/06/2017), 170 mg (10/20/2017), 170 mg (11/17/2017), 160 mg (12/01/2017), 170 mg  (11/03/2017), 160 mg (12/15/2017), 160 mg (12/29/2017), 180 mg (01/12/2018), 180 mg (01/26/2018), 160 mg (02/09/2018), 160 mg (02/23/2018), 160 mg (03/16/2018), 160 mg (03/30/2018), 160 mg (04/13/2018), 180 mg (04/27/2018), 180 mg (05/11/2018), 160 mg (05/25/2018) gemcitabine (GEMZAR) 1,600 mg in sodium chloride 0.9 % 250 mL chemo infusion, 1,672 mg (100 % of original dose 800 mg/m2), Intravenous,  Once, 9 of 10 cycles Dose modification: 800 mg/m2 (original dose 800 mg/m2, Cycle 1, Reason: Patient Age) Administration: 1,600 mg (09/22/2017), 1,600 mg (10/06/2017), 1,600 mg (10/20/2017), 1,600 mg (11/03/2017), 1,600 mg (11/17/2017), 1,600 mg (12/01/2017), 1,600 mg (12/15/2017), 1,600 mg (12/29/2017), 1,600 mg (01/12/2018), 1,600 mg (01/26/2018), 1,600 mg (02/09/2018), 1,600 mg (02/23/2018), 1,600 mg (03/16/2018), 1,600 mg (03/30/2018), 1,600 mg (04/13/2018), 1,600 mg (04/27/2018), 1,600 mg (05/11/2018), 1,600 mg (05/25/2018)  for chemotherapy treatment.    06/08/2018 - 01/10/2019 Chemotherapy   The patient had pembrolizumab (KEYTRUDA) 200 mg in sodium chloride 0.9 % 50 mL chemo infusion, 200 mg, Intravenous, Once, 10 of 11 cycles Administration: 200 mg (06/08/2018), 200 mg (06/29/2018), 200 mg (07/20/2018), 200 mg (08/13/2018), 200 mg (09/03/2018), 200 mg (09/24/2018), 200 mg (10/19/2018), 200 mg (11/09/2018), 200 mg (11/30/2018), 200 mg (12/21/2018)  for chemotherapy treatment.    02/15/2019 -  Chemotherapy   The patient had palonosetron (ALOXI) injection 0.25 mg, 0.25 mg, Intravenous,  Once, 4 of 6 cycles Administration: 0.25 mg (02/15/2019), 0.25 mg (02/22/2019), 0.25 mg (03/01/2019), 0.25 mg (03/18/2019), 0.25 mg (03/25/2019), 0.25 mg (04/05/2019), 0.25 mg (04/26/2019), 0.25 mg (05/03/2019) enfortumab vedotin-ejfv (PADCEV) 90 mg in sodium chloride 0.9 % 50 mL (1.5254 mg/mL) chemo infusion, 89 mg (100 % of original dose 1 mg/kg), Intravenous,  Once, 4 of 6 cycles Dose modification: 1 mg/kg (original dose  1 mg/kg, Cycle 1, Reason: Patient  Age) Administration: 90 mg (02/15/2019), 90 mg (02/22/2019), 90 mg (03/01/2019), 90 mg (03/18/2019), 90 mg (03/25/2019), 90 mg (04/05/2019), 90 mg (04/26/2019), 90 mg (05/03/2019)  for chemotherapy treatment.      Interval history-Anthony Gonzales, 86 year old male diagnosed with urothelial carcinoma who presents to symptom management clinic for rash secondary to [facility], discontinued, and enfortumab vedotin, currently on hold.  I saw him on 06/13/2019 with recurrent drug-induced rash with secondary pruritus.  He was restarted on prednisone 20 mg daily for 7 days along with doxycycline 100 mg twice daily for 7 days.  He continued topical corticosteroids and antihistamines for pruritus.  In the interim, Dr. Evorn Gong for skin cancer on the hand and underwent excision.  He also discussed drug-induced rash but no changes to medications.  Today, patient says rash has generally improved though not resolved and he continues to have pruritus that is bothersome.  Continues to be frustrated by symptoms.    ECOG FS:1 - Symptomatic but completely ambulatory  Review of systems- Review of Systems  Constitutional: Negative for chills, fever, malaise/fatigue and weight loss.  HENT: Negative for hearing loss, nosebleeds, sore throat and tinnitus.   Eyes: Negative for blurred vision and double vision.  Respiratory: Negative for cough, hemoptysis, shortness of breath and wheezing.   Cardiovascular: Negative for chest pain, palpitations and leg swelling.  Gastrointestinal: Negative for abdominal pain, blood in stool, constipation, diarrhea, melena, nausea and vomiting.  Genitourinary: Negative for dysuria and urgency.  Musculoskeletal: Negative for back pain, falls, joint pain and myalgias.  Skin: Positive for itching and rash.  Neurological: Negative for dizziness, tingling, sensory change, loss of consciousness, weakness and headaches.  Endo/Heme/Allergies: Negative for environmental allergies. Does not bruise/bleed  easily.  Psychiatric/Behavioral: Negative for depression. The patient is not nervous/anxious and does not have insomnia.     Current treatment- Padcev/enfortumab vedotin- on hold d/t skin rash  Allergies  Allergen Reactions  . Demerol [Meperidine] Nausea And Vomiting  . Lipitor [Atorvastatin] Swelling  . Sulfa Antibiotics Nausea And Vomiting and Rash    Past Medical History:  Diagnosis Date  . Arthritis   . Benign fibroma of prostate 08/23/2013  . BPH (benign prostatic hyperplasia)   . Calculus of kidney 08/23/2013  . Glaucoma    no drops in 3 mo pressure good, pt denies glaucoma, eye pressure has been measuring alright.  . History of kidney stones   . HLD (hyperlipidemia)   . HOH (hard of hearing)    Left Hearing Aid  . HTN (hypertension) 12/26/2014  . Hypertension   . Hyponatremia 12/26/2014  . Migraines    history of migraines when he was younger.  [name] Restless leg syndrome   . Sinus drainage   . Skin cancer   . Urothelial cancer ([place])    chemo tx's.  [name] UTI (lower urinary tract infection) 12/26/2014  . Vertigo     Past Surgical History:  Procedure Laterality Date  . COLONOSCOPY    . CYSTOSCOPY W/ RETROGRADES Right 01/30/2015   Procedure: CYSTOSCOPY WITH RETROGRADE PYELOGRAM;  Surgeon: [name], MD;  Location: [facility];  Service: Urology;  Laterality: Right;  . CYSTOSCOPY W/ RETROGRADES Bilateral 02/26/2016   Procedure: CYSTOSCOPY WITH RETROGRADE PYELOGRAM;  Surgeon: [name], MD;  Location: [facility];  Service: Urology;  Laterality: Bilateral;  . CYSTOSCOPY W/ URETERAL STENT PLACEMENT Right 08/20/2015   Procedure: CYSTOSCOPY WITH RETROGRADE PYELOGRAM/POSSIBLE URETERAL STENT PLACEMENT/BLADDER BIOPSY;  Surgeon: [name], MD;  Location: [facility];  Service: Urology;  Laterality: Right;  . CYSTOSCOPY W/ URETERAL STENT PLACEMENT Right 09/12/2015   Procedure: CYSTOSCOPY WITH STENT REPLACEMENT;  Surgeon: [name], MD;  Location: [facility];  Service: Urology;   Laterality: Right;  . CYSTOSCOPY WITH BIOPSY Right 09/12/2015   Procedure: CYSTOSCOPY WITH BLADDER AND URETERAL BIOPSY;  Surgeon: [name], MD;  Location: [facility];  Service: Urology;  Laterality: Right;  . CYSTOSCOPY WITH STENT PLACEMENT Right 01/30/2015   Procedure: CYSTOSCOPY WITH STENT PLACEMENT;  Surgeon: [name], MD;  Location: [facility];  Service: Urology;  Laterality: Right;  . CYSTOSCOPY WITH STENT PLACEMENT Right 12/28/2017   Procedure: [place] WITH STENT Exchange;  Surgeon: [name], MD;  Location: [facility];  Service: Urology;  Laterality: Right;  . CYSTOSCOPY/URETEROSCOPY/HOLMIUM LASER/STENT PLACEMENT Right 08/19/2017   Procedure: CYSTOSCOPY/URETEROSCOPY/HOLMIUM LASER/STENT PLACEMENT;  Surgeon: [name], MD;  Location: [facility];  Service: Urology;  Laterality: Right;  . EYE SURGERY Bilateral    Cataract Extraction with IOL  . goiter removal    . HOLMIUM LASER APPLICATION N/A 1/88/4166   Procedure:  HOLMIUM LASER APPLICATION;  Surgeon: [name], MD;  Location: [facility];  Service: Urology;  Laterality: N/A;  . PORTA CATH INSERTION N/A 09/23/2017   Procedure: PORTA CATH INSERTION;  Surgeon: Algernon Huxley, MD;  Location: [facility];  Service: Cardiovascular;  Laterality: N/A;  . SPERMATOCELECTOMY    . TONSILLECTOMY    . TRANSURETHRAL RESECTION OF BLADDER TUMOR N/A 02/26/2016   Procedure: TRANSURETHRAL RESECTION OF BLADDER TUMOR (TURBT);  Surgeon: [name], MD;  Location: [facility];  Service: Urology;  Laterality: N/A;  . TRANSURETHRAL RESECTION OF BLADDER TUMOR WITH MITOMYCIN-C N/A 09/12/2015   Procedure: TRANSURETHRAL RESECTION OF BLADDER TUMOR ;  Surgeon: [name], MD;  Location: [facility];  Service: Urology;  Laterality: N/A;  . TRANSURETHRAL RESECTION OF BLADDER TUMOR WITH MITOMYCIN-C N/A 03/24/2016   Procedure: TRANSURETHRAL RESECTION OF BLADDER TUMOR WITH MITOMYCIN-C  (SMALL);  Surgeon: [name], MD;  Location: [facility];   Service: Urology;  Laterality: N/A;  . URETERAL BIOPSY Right 08/19/2017   Procedure: Renal Mass BIOPSY;  Surgeon: [name], MD;  Location: [facility];  Service: Urology;  Laterality: Right;  . URETEROSCOPY Right 01/30/2015   Procedure: URETEROSCOPY/ WITH BIOPSY AND CYTOLOGY BRUSHING;  Surgeon: [name], MD;  Location: [facility];  Service: Urology;  Laterality: Right;  . URETEROSCOPY Right 08/20/2015   Procedure: URETEROSCOPY;  Surgeon: [name], MD;  Location: [facility];  Service: Urology;  Laterality: Right;  . URETEROSCOPY Right 09/12/2015   Procedure: URETEROSCOPY;  Surgeon: [name], MD;  Location: [facility];  Service: Urology;  Laterality: Right;  . URETEROSCOPY Right 02/26/2016   Procedure: URETEROSCOPY;  Surgeon: [name], MD;  Location: [facility];  Service: Urology;  Laterality: Right;    Social History   Socioeconomic History  . Marital status: Married    Spouse name: Not on file  . Number of children: Not on file  . Years of education: Not on file  . Highest education level: Not on file  Occupational History  . Not on file  Tobacco Use  . Smoking status: Never Smoker  . Smokeless tobacco: Never Used  Substance and Sexual Activity  . Alcohol use: No    Alcohol/week: 0.0 standard drinks  . Drug use: No  . Sexual activity: Not on file  Other Topics Concern  . Not on file  Social History Narrative  . Not on file   Social Determinants of  Health   Financial Resource Strain:   . Difficulty of Paying Living Expenses:   Food Insecurity:   . Worried About Charity fundraiser in the Last Year:   . Arboriculturist in the Last Year:   Transportation Needs:   . Film/video editor (Medical):   [name] Lack of Transportation (Non-Medical):   Physical Activity:   . Days of Exercise per Week:   . Minutes of Exercise per Session:   Stress:   . Feeling of Stress :   Social Connections:   . Frequency of Communication with Friends and Family:   . Frequency  of Social Gatherings with Friends and Family:   . Attends Religious Services:   . Active Member of Clubs or Organizations:   . Attends Archivist Meetings:   [name] Marital Status:   Intimate Partner Violence:   . Fear of Current or Ex-Partner:   . Emotionally Abused:   [name] Physically Abused:   . Sexually Abused:     Family History  Problem Relation Age of Onset  . Hypertension Mother   . Hypertension Father   . Prostate cancer Brother   . Kidney disease Neg Hx   . Kidney cancer Neg Hx   . Bladder Cancer Neg Hx      Current Outpatient Medications:  .  acetaminophen (TYLENOL) 500 MG tablet, Take 1,000 mg by mouth daily as needed for moderate pain. , Disp: , Rfl:  .  amLODipine (NORVASC) 5 MG tablet, Take 5 mg by mouth daily. , Disp: , Rfl:  .  benazepril-hydrochlorthiazide (LOTENSIN HCT) 20-12.5 MG tablet, Take 1 tablet by mouth daily. , Disp: , Rfl:  .  camphor-menthol (SARNA) lotion, Apply 1 application topically as needed for itching., Disp: , Rfl:  .  cetirizine (ZYRTEC) 10 MG tablet, Take 10 mg by mouth daily., Disp: , Rfl:  .  diazepam (VALIUM) 5 MG tablet, TAKE 1 TABLET (5 MG TOTAL) BY MOUTH EVERY 12 (TWELVE) HOURS AS NEEDED FOR ANXIETY OR SLEEP, Disp: , Rfl:  .  docusate sodium (COLACE) 100 MG capsule, Take 100 mg by mouth 2 (two) times daily. , Disp: , Rfl:  .  doxycycline (VIBRA-TABS) 100 MG tablet, Take 1 tablet (100 mg total) by mouth 2 (two) times daily for 7 days., Disp: 14 tablet, Rfl: 0 .  fenofibrate micronized (LOFIBRA) 134 MG capsule, Take 134 mg by mouth daily before breakfast. , Disp: , Rfl:  .  fluticasone (FLONASE) 50 MCG/ACT nasal spray, Place 2 sprays into both nostrils at bedtime., Disp: , Rfl:  .  HYDROcodone-acetaminophen (NORCO/VICODIN) 5-325 MG tablet, Take 1 tablet by mouth every 6 (six) hours as needed for severe pain., Disp: 30 tablet, Rfl: 0 .  hydrOXYzine (ATARAX/VISTARIL) 25 MG tablet, Take 25 mg by mouth 3 (three) times daily as needed., Disp:  , Rfl:  .  mirabegron ER (MYRBETRIQ) 50 MG TB24 tablet, Take 1 tablet (50 mg total) by mouth daily., Disp: 30 tablet, Rfl: 11 .  Multiple Vitamin (MULTIVITAMIN WITH MINERALS) TABS tablet, Take 1 tablet by mouth daily., Disp: , Rfl:  .  nystatin (MYCOSTATIN/NYSTOP) powder, APPLY TO AFFECTED AREA 4 TIMES A DAY, Disp: 15 g, Rfl: 0 .  polyethylene glycol (MIRALAX / GLYCOLAX) 17 g packet, Take 17 g by mouth daily., Disp: , Rfl:  .  Potassium 99 MG TABS, Take 99 mg by mouth at bedtime., Disp: , Rfl:  .  predniSONE (DELTASONE) 10 MG tablet, Take 2 tablets (20 mg  total) by mouth daily with breakfast for 7 days., Disp: 14 tablet, Rfl: 0 .  psyllium (METAMUCIL) 58.6 % packet, Take 1 packet by mouth daily. , Disp: , Rfl:  .  tamsulosin (FLOMAX) 0.4 MG CAPS capsule, Take 1 capsule (0.4 mg total) by mouth daily., Disp: 90 capsule, Rfl: 3 .  triamcinolone cream (KENALOG) 0.1 %, Apply 1 application topically 2 (two) times daily., Disp: , Rfl:  No current facility-administered medications for this visit.  Facility-Administered Medications Ordered in Other Visits:  .  sodium chloride flush (NS) 0.9 % injection 10 mL, 10 mL, Intravenous, Once, Sindy Guadeloupe, MD  Physical exam:  Vitals:   06/20/19 1029  BP: (!) 145/84  Pulse: 79  Resp: 16  Temp: 97.8 F (36.6 C)  TempSrc: Tympanic   Physical Exam Constitutional:      General: He is not in acute distress.    Comments: Unaccompanied, wearing mask  HENT:     Mouth/Throat:     Mouth: Mucous membranes are moist.     Pharynx: Oropharynx is clear.     Comments: No oral lesions per patient Skin:    General: Skin is warm and dry.     Findings: Rash (erythematous, scaly, pruritic papules scattered across chest, abdomen, arms, shoulders, and back) present.  Neurological:     Mental Status: He is alert and oriented to person, place, and time.  Psychiatric:        Behavior: Behavior normal.      CMP Latest Ref Rng & Units 05/17/2019  Glucose 70 - 99  mg/dL 158(H)  BUN 8 - 23 mg/dL 23  Creatinine 0.61 - 1.24 mg/dL 1.15  Sodium 135 - 145 mmol/L 131(L)  Potassium 3.5 - 5.1 mmol/L 3.7  Chloride 98 - 111 mmol/L 95(L)  CO2 22 - 32 mmol/L 26  Calcium 8.9 - 10.3 mg/dL 8.9  Total Protein 6.5 - 8.1 g/dL 6.4(L)  Total Bilirubin 0.3 - 1.2 mg/dL 0.8  Alkaline Phos 38 - 126 U/L 49  AST 15 - 41 U/L 32  ALT 0 - 44 U/L 28   CBC Latest Ref Rng & Units 05/17/2019  WBC 4.0 - 10.5 K/uL 9.6  Hemoglobin 13.0 - 17.0 g/dL 13.5  Hematocrit 39.0 - 52.0 % 40.0  Platelets 150 - 400 K/uL 300    No images are attached to the encounter.  No results found.  Assessment and plan- Patient is a 86 y.o. male diagnosed with urothelial carcinoma, most recently on enfortumab vedotin, which is currently on hold due to cutaneous toxicity. He returns to [facility] today for re-evaluation of skin rash and ongoing management. He is s/p 1 week of restarting prednisone 20 mg daily. Symptoms persist though by his report, slightly improved.   Discussed with Dr. Dasher/Dermatology who recommends consideration of methotrexate, possible dose of 10-15 mg/week with goal of improvement over months/weeks as opposed to days. Goal of stabilizing symptoms on lowest dose possible as to minimize systemic immunocompromise/impairment and impact on immunotherapy. I also reviewed Wu, S., & Adamson, A. S. (2019). Cutaneous toxicity associated with enfortumab vedotin treatment of metastatic urothelial carcinoma. Dermatology Online Journal, 25(2). Retrieved from https://johnson-hawkins.net/ in reference to this case. In this case study patient was treated with topical corticosteroids. Unfortunately Mr. Hodes's symptoms refractory to steroids and evidence of cutaneous atrophy. I will discuss methotrexate with Dr. Janese Banks. In the interim, continue prednisone 20 mg with goal of reducing dose based on symptoms.   Disposition:  - Follow up with  Dr. Janese Banks next week for  re-evaluation.  - Continue planned follow up with Dr. Evorn Gong. Plan for biopsy of rash.    Visit Diagnosis 1. Skin changes related to chemotherapy     Patient expressed understanding and was in agreement with this plan. He also understands that He can call clinic at any time with any questions, concerns, or complaints.   Thank you for allowing me to participate in the care of this very pleasant patient.   Beckey Rutter, DNP, AGNP-C [facility]  CC:

## 2019-06-21 ENCOUNTER — Other Ambulatory Visit: Payer: Self-pay | Admitting: Nurse Practitioner

## 2019-06-21 MED ORDER — PREDNISONE 10 MG PO TABS: 20.0000 mg | ORAL_TABLET | Freq: Every day | ORAL | 0 refills | Status: AC

## 2019-06-21 NOTE — Progress Notes (Signed)
Recommendations discussed with patient. He is scheduled to see Dr. Evorn Gong next Thursday. I will refill prednisone through that appointment then Dr. Evorn Gong will titrate based on methotrexate.

## 2019-06-27 ENCOUNTER — Telehealth: Payer: Self-pay | Admitting: *Deleted

## 2019-06-27 NOTE — Telephone Encounter (Deleted)
Patient scheduled for appointment this afternoon and accepts appointment

## 2019-06-27 NOTE — Telephone Encounter (Signed)
Patient called reporting that per Dr Evorn Gong, it is up to [place] if he needs to be tapered off of the prednisone or not. If so we need to order more prednisone for him with tapering directions. Patient states that Alease Medina, NP had ordered to Prednisone initially Please advise.

## 2019-06-27 NOTE — Telephone Encounter (Signed)
Dr. Janese Banks pt

## 2019-06-28 ENCOUNTER — Telehealth: Payer: Self-pay | Admitting: *Deleted

## 2019-06-28 NOTE — Telephone Encounter (Signed)
Called pt about if dr dasher put him on methotrexate and if he put him on prednisone. He states that he was put on methatrexate 2.5mg  and takes 4 pills weekly starting sat 4/17. He does not have as much rash but itches the worst on both side of body going down sides of ribs. Dr. Evorn Gong did not give him prednisone and said out office would need to do that. I spoke to Janese Banks and she said since he is not on any prednisone it is best to stay off of it because of being on methotrexate. The drug can make counts drop so she wants him to come and get labs in 2 weeks. He already has an appt 4/27 so a lab was added. Also if pt gets miserable with rash and itching then he can call us and rao will give him prednisone but if he is able to tolerate status now then cont. Without steroids. Pt is agreeable to above plan and he was advised to try benadryl to see if that would help at all.

## 2019-06-28 NOTE — Telephone Encounter (Signed)
I put in telephone note about him

## 2019-06-30 DIAGNOSIS — L309 Dermatitis, unspecified: Secondary | ICD-10-CM | POA: Diagnosis not present

## 2019-06-30 DIAGNOSIS — L258 Unspecified contact dermatitis due to other agents: Secondary | ICD-10-CM | POA: Diagnosis not present

## 2019-07-05 ENCOUNTER — Inpatient Hospital Stay (HOSPITAL_BASED_OUTPATIENT_CLINIC_OR_DEPARTMENT_OTHER): Payer: PPO | Admitting: Oncology

## 2019-07-05 ENCOUNTER — Other Ambulatory Visit: Payer: Self-pay

## 2019-07-05 ENCOUNTER — Inpatient Hospital Stay: Payer: PPO

## 2019-07-05 ENCOUNTER — Encounter: Payer: Self-pay | Admitting: Oncology

## 2019-07-05 VITALS — BP 117/70 | HR 79 | Temp 96.2°F | Resp 16 | Wt 195.0 lb

## 2019-07-05 DIAGNOSIS — L270 Generalized skin eruption due to drugs and medicaments taken internally: Secondary | ICD-10-CM | POA: Diagnosis not present

## 2019-07-05 DIAGNOSIS — C669 Malignant neoplasm of unspecified ureter: Secondary | ICD-10-CM

## 2019-07-05 DIAGNOSIS — N179 Acute kidney failure, unspecified: Secondary | ICD-10-CM | POA: Diagnosis not present

## 2019-07-05 LAB — CBC WITH DIFFERENTIAL/PLATELET
Abs Immature Granulocytes: 0.04 10*3/uL (ref 0.00–0.07)
Basophils Absolute: 0.1 10*3/uL (ref 0.0–0.1)
Basophils Relative: 1 %
Eosinophils Absolute: 0.4 10*3/uL (ref 0.0–0.5)
Eosinophils Relative: 6 %
HCT: 38 % — ABNORMAL LOW (ref 39.0–52.0)
Hemoglobin: 13.2 g/dL (ref 13.0–17.0)
Immature Granulocytes: 1 %
Lymphocytes Relative: 17 %
Lymphs Abs: 1 10*3/uL (ref 0.7–4.0)
MCH: 30.8 pg (ref 26.0–34.0)
MCHC: 34.7 g/dL (ref 30.0–36.0)
MCV: 88.8 fL (ref 80.0–100.0)
Monocytes Absolute: 0.6 10*3/uL (ref 0.1–1.0)
Monocytes Relative: 10 %
Neutro Abs: 4.1 10*3/uL (ref 1.7–7.7)
Neutrophils Relative %: 65 %
Platelets: 194 10*3/uL (ref 150–400)
RBC: 4.28 MIL/uL (ref 4.22–5.81)
RDW: 13.9 % (ref 11.5–15.5)
WBC: 6.2 10*3/uL (ref 4.0–10.5)
nRBC: 0 % (ref 0.0–0.2)

## 2019-07-05 LAB — COMPREHENSIVE METABOLIC PANEL
ALT: 32 U/L (ref 0–44)
AST: 37 U/L (ref 15–41)
Albumin: 3.7 g/dL (ref 3.5–5.0)
Alkaline Phosphatase: 42 U/L (ref 38–126)
Anion gap: 9 (ref 5–15)
BUN: 23 mg/dL (ref 8–23)
CO2: 29 mmol/L (ref 22–32)
Calcium: 8.6 mg/dL — ABNORMAL LOW (ref 8.9–10.3)
Chloride: 95 mmol/L — ABNORMAL LOW (ref 98–111)
Creatinine, Ser: 1.29 mg/dL — ABNORMAL HIGH (ref 0.61–1.24)
GFR calc Af Amer: 58 mL/min — ABNORMAL LOW (ref >60)
GFR calc non Af Amer: 50 mL/min — ABNORMAL LOW (ref >60)
Glucose, Bld: 90 mg/dL (ref 70–99)
Potassium: 4 mmol/L (ref 3.5–5.1)
Sodium: 133 mmol/L — ABNORMAL LOW (ref 135–145)
Total Bilirubin: 0.7 mg/dL (ref 0.3–1.2)
Total Protein: 6.2 g/dL — ABNORMAL LOW (ref 6.5–8.1)

## 2019-07-05 NOTE — Progress Notes (Signed)
Pt states that since is 2nd dose of methetrexate, he thinks the belly rash better and wife states the back is a little better. He would like a refill hydroxine to help with itching at night. He also states that right leg thigh after walking too much hurts and sometimes keeps him from going to sleep

## 2019-07-08 ENCOUNTER — Inpatient Hospital Stay: Payer: PPO

## 2019-07-08 ENCOUNTER — Other Ambulatory Visit: Payer: Self-pay

## 2019-07-08 NOTE — Progress Notes (Signed)
Hematology/Oncology Consult note [facility]  Telephone:([phone] [phone]  Patient Care Team: Idelle Crouch, MD as PCP - General (Internal Medicine) Sindy Guadeloupe, MD as Consulting Physician (Hematology and Oncology) Sindy Guadeloupe, MD as Consulting Physician (Hematology and Oncology) Sindy Guadeloupe, MD as Consulting Physician (Hematology and Oncology)   Name of the patient: Anthony Gonzales  [id number]  1931/09/09   Date of visit: 07/08/19  Diagnosis- Metastatic urothelial carcinoma with possible mets to the obturator node. Indeterminate hilar lymph nodes and LUL lung nodule  Chief complaint/ Reason for visit-routine visit for ongoing skin rash  Heme/Onc history: patient is a 86 year old male with past medical history significant for hypertension and long-standing history of superficial bladder cancer for which he sees [name]. He has undergone TURBT as well as ureteroscopy since 2017 along with mitomycin as well in the past. Most recently he underwent CT abdomen on 08/06/2017 which showed a soft tissue mass in the right renal pelvis concerning for upper tract urothelial neoplasm. Soft tissue fullness at the right ureterovesical junction with proximal right hydroureteronephrosis. Several millimeter attenuation lesion in the pancreatic head.  He underwent diagnostic ureteroscopy and was found to have 2 high-grade lesions within his right kidney. There was a nodular high-grade appearing lesion in the right anterior renal pelvis. There was also a second ureteral tumor fungating from the right ureteral orifice extending into the distal ureter also consistent with high-grade invasive urothelial carcinoma. Muscle invasion could not be assessed. He also underwent a CT chest which showed a rounded nodule in the right upper lobe measuring 14 mm concerning for metastases. 2 other 3 mm lesions were also noted in the left upper lobe likely  benign  Plan initially was neoadjuvant chemotherapy followed by possible surgery but given the presence of lung lesion patient has been referred to oncology for the same.  PET/CT on 09/21/17 showed:IMPRESSION: 1. Hypermetabolic right obturator lymph node is most indicative of metastatic disease. 2. Mildly hypermetabolic left hilar lymph nodes are nonspecific. Continued attention on follow-up exams is warranted. 3. Right upper lobe pulmonary nodule shows metabolism after just above blood pool and is therefore indeterminate. Continued attention on follow-up exams is warranted. 4. Aortic atherosclerosis (ICD10-170.0). Coronary artery calcification. 5. Cholelithiasis  Carboplatin/ gemzar1 week on and one-week off as patient could not tolerate 2-week on and one week off regimen. Cycle 1 started on 09/22/2017 Disease progression in March 2020. Switched to second [employer]  Patient had disease controlled with Keytruda. However he was noted to have worsening dermatitis with constant flareups requiring steroids. Beryle Flock is therefore being kept on hold and patient will be switched to third line Padcev.    Treatment has been on hold since February 2021 due to constant flareup of skin rash  Interval history-patient was requiring frequent doses of steroid courses for flareup of his rash which mainly occurs over his back and abdominal wall.  He sees Dr. Evorn Gong.  He was switched to methotrexate once a week as a steroid sparing option.  He also continues to use topical steroids when his rash gets worse.  Presently patient reports that since starting his methotrexate about 2 weeks ago his rash is somewhat under control but he still has persistent rash on his trunk.  He has been using his topical steroid cream.  ECOG PS- 1 Pain scale- 0   Review of systems- Review of Systems  Constitutional: Negative for chills, fever, malaise/fatigue and weight loss.  HENT: Negative  for congestion, ear  discharge and nosebleeds.   Eyes: Negative for blurred vision.  Respiratory: Negative for cough, hemoptysis, sputum production, shortness of breath and wheezing.   Cardiovascular: Negative for chest pain, palpitations, orthopnea and claudication.  Gastrointestinal: Negative for abdominal pain, blood in stool, constipation, diarrhea, heartburn, melena, nausea and vomiting.  Genitourinary: Negative for dysuria, flank pain, frequency, hematuria and urgency.  Musculoskeletal: Negative for back pain, joint pain and myalgias.  Skin: Positive for rash.  Neurological: Negative for dizziness, tingling, focal weakness, seizures, weakness and headaches.  Endo/Heme/Allergies: Does not bruise/bleed easily.  Psychiatric/Behavioral: Negative for depression and suicidal ideas. The patient does not have insomnia.       Allergies  Allergen Reactions  . Demerol [Meperidine] Nausea And Vomiting  . Lipitor [Atorvastatin] Swelling  . Sulfa Antibiotics Nausea And Vomiting and Rash     Past Medical History:  Diagnosis Date  . Arthritis   . Benign fibroma of prostate 08/23/2013  . BPH (benign prostatic hyperplasia)   . Calculus of kidney 08/23/2013  . Glaucoma    no drops in 3 mo pressure good, pt denies glaucoma, eye pressure has been measuring alright.  . History of kidney stones   . HLD (hyperlipidemia)   . HOH (hard of hearing)    Left Hearing Aid  . HTN (hypertension) 12/26/2014  . Hypertension   . Hyponatremia 12/26/2014  . Migraines    history of migraines when he was younger.  [name] Restless leg syndrome   . Sinus drainage   . Skin cancer   . Skin cancer    left hand 06/2019  . Urothelial cancer ([place])    chemo tx's.  [name] UTI (lower urinary tract infection) 12/26/2014  . Vertigo      Past Surgical History:  Procedure Laterality Date  . COLONOSCOPY    . CYSTOSCOPY W/ RETROGRADES Right 01/30/2015   Procedure: CYSTOSCOPY WITH RETROGRADE PYELOGRAM;  Surgeon: [name], MD;  Location:  [facility];  Service: Urology;  Laterality: Right;  . CYSTOSCOPY W/ RETROGRADES Bilateral 02/26/2016   Procedure: CYSTOSCOPY WITH RETROGRADE PYELOGRAM;  Surgeon: [name], MD;  Location: [facility];  Service: Urology;  Laterality: Bilateral;  . CYSTOSCOPY W/ URETERAL STENT PLACEMENT Right 08/20/2015   Procedure: CYSTOSCOPY WITH RETROGRADE PYELOGRAM/POSSIBLE URETERAL STENT PLACEMENT/BLADDER BIOPSY;  Surgeon: [name], MD;  Location: [facility];  Service: Urology;  Laterality: Right;  . CYSTOSCOPY W/ URETERAL STENT PLACEMENT Right 09/12/2015   Procedure: CYSTOSCOPY WITH STENT REPLACEMENT;  Surgeon: [name], MD;  Location: [facility];  Service: Urology;  Laterality: Right;  . CYSTOSCOPY WITH BIOPSY Right 09/12/2015   Procedure: CYSTOSCOPY WITH BLADDER AND URETERAL BIOPSY;  Surgeon: [name], MD;  Location: [facility];  Service: Urology;  Laterality: Right;  . CYSTOSCOPY WITH STENT PLACEMENT Right 01/30/2015   Procedure: CYSTOSCOPY WITH STENT PLACEMENT;  Surgeon: [name], MD;  Location: [facility];  Service: Urology;  Laterality: Right;  . CYSTOSCOPY WITH STENT PLACEMENT Right 12/28/2017   Procedure: [place] WITH STENT Exchange;  Surgeon: [name], MD;  Location: [facility];  Service: Urology;  Laterality: Right;  . CYSTOSCOPY/URETEROSCOPY/HOLMIUM LASER/STENT PLACEMENT Right 08/19/2017   Procedure: CYSTOSCOPY/URETEROSCOPY/HOLMIUM LASER/STENT PLACEMENT;  Surgeon: [name], MD;  Location: [facility];  Service: Urology;  Laterality: Right;  . EYE SURGERY Bilateral    Cataract Extraction with IOL  . goiter removal    . HOLMIUM LASER APPLICATION N/A 1/91/4782   Procedure:  HOLMIUM LASER APPLICATION;  Surgeon: [name], MD;  Location: [facility];  Service:  Urology;  Laterality: N/A;  . PORTA CATH INSERTION N/A 09/23/2017   Procedure: PORTA CATH INSERTION;  Surgeon: Algernon Huxley, MD;  Location: [facility];  Service: Cardiovascular;  Laterality: N/A;  .  SPERMATOCELECTOMY    . TONSILLECTOMY    . TRANSURETHRAL RESECTION OF BLADDER TUMOR N/A 02/26/2016   Procedure: TRANSURETHRAL RESECTION OF BLADDER TUMOR (TURBT);  Surgeon: [name], MD;  Location: [facility];  Service: Urology;  Laterality: N/A;  . TRANSURETHRAL RESECTION OF BLADDER TUMOR WITH MITOMYCIN-C N/A 09/12/2015   Procedure: TRANSURETHRAL RESECTION OF BLADDER TUMOR ;  Surgeon: [name], MD;  Location: [facility];  Service: Urology;  Laterality: N/A;  . TRANSURETHRAL RESECTION OF BLADDER TUMOR WITH MITOMYCIN-C N/A 03/24/2016   Procedure: TRANSURETHRAL RESECTION OF BLADDER TUMOR WITH MITOMYCIN-C  (SMALL);  Surgeon: [name], MD;  Location: [facility];  Service: Urology;  Laterality: N/A;  . URETERAL BIOPSY Right 08/19/2017   Procedure: Renal Mass BIOPSY;  Surgeon: [name], MD;  Location: [facility];  Service: Urology;  Laterality: Right;  . URETEROSCOPY Right 01/30/2015   Procedure: URETEROSCOPY/ WITH BIOPSY AND CYTOLOGY BRUSHING;  Surgeon: [name], MD;  Location: [facility];  Service: Urology;  Laterality: Right;  . URETEROSCOPY Right 08/20/2015   Procedure: URETEROSCOPY;  Surgeon: [name], MD;  Location: [facility];  Service: Urology;  Laterality: Right;  . URETEROSCOPY Right 09/12/2015   Procedure: URETEROSCOPY;  Surgeon: [name], MD;  Location: [facility];  Service: Urology;  Laterality: Right;  . URETEROSCOPY Right 02/26/2016   Procedure: URETEROSCOPY;  Surgeon: [name], MD;  Location: [facility];  Service: Urology;  Laterality: Right;    Social History   Socioeconomic History  . Marital status: Married    Spouse name: Not on file  . Number of children: Not on file  . Years of education: Not on file  . Highest education level: Not on file  Occupational History  . Not on file  Tobacco Use  . Smoking status: Never Smoker  . Smokeless tobacco: Never Used  Substance and Sexual Activity  . Alcohol use: No    Alcohol/week: 0.0 standard drinks  .  Drug use: No  . Sexual activity: Not on file  Other Topics Concern  . Not on file  Social History Narrative  . Not on file   Social Determinants of Health   Financial Resource Strain:   . Difficulty of Paying Living Expenses:   Food Insecurity:   . Worried About Charity fundraiser in the Last Year:   . Arboriculturist in the Last Year:   Transportation Needs:   . Film/video editor (Medical):   [name] Lack of Transportation (Non-Medical):   Physical Activity:   . Days of Exercise per Week:   . Minutes of Exercise per Session:   Stress:   . Feeling of Stress :   Social Connections:   . Frequency of Communication with Friends and Family:   . Frequency of Social Gatherings with Friends and Family:   . Attends Religious Services:   . Active Member of Clubs or Organizations:   . Attends Archivist Meetings:   [name] Marital Status:   Intimate Partner Violence:   . Fear of Current or Ex-Partner:   . Emotionally Abused:   [name] Physically Abused:   . Sexually Abused:     Family History  Problem Relation Age of Onset  . Hypertension Mother   . Hypertension Father   . Prostate cancer Brother   .  Kidney disease Neg Hx   . Kidney cancer Neg Hx   . Bladder Cancer Neg Hx      Current Outpatient Medications:  .  acetaminophen (TYLENOL) 500 MG tablet, Take 1,000 mg by mouth daily as needed for moderate pain. , Disp: , Rfl:  .  amLODipine (NORVASC) 5 MG tablet, Take 5 mg by mouth daily. , Disp: , Rfl:  .  benazepril-hydrochlorthiazide (LOTENSIN HCT) 20-12.5 MG tablet, Take 1 tablet by mouth daily. , Disp: , Rfl:  .  camphor-menthol (SARNA) lotion, Apply 1 application topically as needed for itching., Disp: , Rfl:  .  diazepam (VALIUM) 5 MG tablet, TAKE 1 TABLET (5 MG TOTAL) BY MOUTH EVERY 12 (TWELVE) HOURS AS NEEDED FOR ANXIETY OR SLEEP, Disp: , Rfl:  .  fenofibrate micronized (LOFIBRA) 134 MG capsule, Take 134 mg by mouth daily before breakfast. , Disp: , Rfl:  .  folic acid  (FOLVITE) 1 MG tablet, Take 1 mg by mouth as directed. Take it daily on Friday to wed only each week, Disp: , Rfl:  .  hydrOXYzine (ATARAX/VISTARIL) 25 MG tablet, Take 25 mg by mouth 3 (three) times daily as needed., Disp: , Rfl:  .  Multiple Vitamin (MULTIVITAMIN WITH MINERALS) TABS tablet, Take 1 tablet by mouth daily., Disp: , Rfl:  .  nystatin (MYCOSTATIN/NYSTOP) powder, APPLY TO AFFECTED AREA 4 TIMES A DAY (Patient taking differently: Apply 1 application topically as needed. ), Disp: 15 g, Rfl: 0 .  Potassium 99 MG TABS, Take 99 mg by mouth at bedtime., Disp: , Rfl:  .  psyllium (METAMUCIL) 58.6 % packet, Take 1 packet by mouth daily. , Disp: , Rfl:  .  tamsulosin (FLOMAX) 0.4 MG CAPS capsule, Take 1 capsule (0.4 mg total) by mouth daily., Disp: 90 capsule, Rfl: 3 .  triamcinolone cream (KENALOG) 0.1 %, Apply 1 application topically 2 (two) times daily., Disp: , Rfl:  .  cetirizine (ZYRTEC) 10 MG tablet, Take 10 mg by mouth daily., Disp: , Rfl:  .  docusate sodium (COLACE) 100 MG capsule, Take 100 mg by mouth 2 (two) times daily. , Disp: , Rfl:  .  HYDROcodone-acetaminophen (NORCO/VICODIN) 5-325 MG tablet, Take 1 tablet by mouth every 6 (six) hours as needed for severe pain. (Patient not taking: Reported on 07/05/2019), Disp: 30 tablet, Rfl: 0 .  polyethylene glycol (MIRALAX / GLYCOLAX) 17 g packet, Take 17 g by mouth daily as needed. , Disp: , Rfl:  No current facility-administered medications for this visit.  Facility-Administered Medications Ordered in Other Visits:  .  sodium chloride flush (NS) 0.9 % injection 10 mL, 10 mL, Intravenous, Once, Sindy Guadeloupe, MD  Physical exam:  Vitals:   07/05/19 1027  BP: 117/70  Pulse: 79  Resp: 16  Temp: (!) 96.2 F (35.7 C)  TempSrc: Tympanic  Weight: 195 lb (88.5 kg)   Physical Exam Constitutional:      General: He is not in acute distress. Cardiovascular:     Rate and Rhythm: Normal rate and regular rhythm.     Heart sounds: Normal  heart sounds.  Pulmonary:     Effort: Pulmonary effort is normal.     Breath sounds: Normal breath sounds.  Abdominal:     General: Bowel sounds are normal.     Palpations: Abdomen is soft.  Skin:    General: Skin is warm and dry.  Neurological:     Mental Status: He is alert and oriented to person, place, and time.  CMP Latest Ref Rng & Units 07/05/2019  Glucose 70 - 99 mg/dL 90  BUN 8 - 23 mg/dL 23  Creatinine 0.61 - 1.24 mg/dL 1.29(H)  Sodium 135 - 145 mmol/L 133(L)  Potassium 3.5 - 5.1 mmol/L 4.0  Chloride 98 - 111 mmol/L 95(L)  CO2 22 - 32 mmol/L 29  Calcium 8.9 - 10.3 mg/dL 8.6(L)  Total Protein 6.5 - 8.1 g/dL 6.2(L)  Total Bilirubin 0.3 - 1.2 mg/dL 0.7  Alkaline Phos 38 - 126 U/L 42  AST 15 - 41 U/L 37  ALT 0 - 44 U/L 32   CBC Latest Ref Rng & Units 07/05/2019  WBC 4.0 - 10.5 K/uL 6.2  Hemoglobin 13.0 - 17.0 g/dL 13.2  Hematocrit 39.0 - 52.0 % 38.0(L)  Platelets 150 - 400 K/uL 194      Assessment and plan- Patient is a 86 y.o. male with metastatic urothelial carcinoma with metastases to the lymph nodes.  This is a routine follow-up visit for ongoing skin rash  1.  Padcev treatment has been currently on hold since February since patient has been getting recurrent flareups of his skin rash requiring steroid courses.  Currently patient is off steroids but had to be started on a steroid sparing medication methotrexate by Dr. Evorn Gong his dermatologist.  He is also using topical steroids and presently his rash seems to be under reasonable control although it has not completely gone away.  I will continue to hold Padcev at this time.  We had to stop Keytruda for similar reasons.  Patient will go for repeat CT chest abdomen and pelvis with contrast and given his age and ongoing symptoms I will consider holding treatment until we see evidence of disease progression on scans.  2.  AKI: Patient's creatinine is mildly elevated at 1.2.  We will monitor this closely as he is on  methotrexate.  I have asked patient to improve his oral intake and drink more fluids over the next 2 weeks.  I will see him back in 2 weeks to discuss CT scan results and blood work   Visit Diagnosis 1. Drug-induced skin rash   2. 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 AKI (acute kidney injury) ([place])      Dr. Randa Evens, MD, MPH [facility] [phone] 07/08/2019 11:26 AM

## 2019-07-11 ENCOUNTER — Inpatient Hospital Stay: Payer: PPO

## 2019-07-11 ENCOUNTER — Ambulatory Visit: Payer: PPO

## 2019-07-11 ENCOUNTER — Other Ambulatory Visit: Payer: PPO

## 2019-07-12 ENCOUNTER — Other Ambulatory Visit: Payer: Self-pay

## 2019-07-12 ENCOUNTER — Ambulatory Visit
Admission: RE | Admit: 2019-07-12 | Discharge: 2019-07-12 | Disposition: A | Discharge status: Home / Self Care | Payer: PPO | Source: Ambulatory Visit | Attending: Oncology | Admitting: Oncology

## 2019-07-12 DIAGNOSIS — C669 Malignant neoplasm of unspecified ureter: Secondary | ICD-10-CM | POA: Diagnosis not present

## 2019-07-12 DIAGNOSIS — R918 Other nonspecific abnormal finding of lung field: Secondary | ICD-10-CM | POA: Diagnosis not present

## 2019-07-12 DIAGNOSIS — I700 Atherosclerosis of aorta: Secondary | ICD-10-CM | POA: Insufficient documentation

## 2019-07-12 DIAGNOSIS — K8020 Calculus of gallbladder without cholecystitis without obstruction: Secondary | ICD-10-CM | POA: Diagnosis not present

## 2019-07-12 DIAGNOSIS — K5730 Diverticulosis of large intestine without perforation or abscess without bleeding: Secondary | ICD-10-CM | POA: Insufficient documentation

## 2019-07-12 DIAGNOSIS — K869 Disease of pancreas, unspecified: Secondary | ICD-10-CM | POA: Diagnosis not present

## 2019-07-12 DIAGNOSIS — C679 Malignant neoplasm of bladder, unspecified: Secondary | ICD-10-CM | POA: Diagnosis not present

## 2019-07-12 MED ORDER — IOHEXOL 300 MG/ML  SOLN
100.0000 mL | Freq: Once | INTRAMUSCULAR | Status: AC | PRN
  Administered 2019-07-12: 10:00:00 100 mL via INTRAVENOUS

## 2019-07-18 ENCOUNTER — Ambulatory Visit: Payer: PPO

## 2019-07-18 ENCOUNTER — Other Ambulatory Visit: Payer: Self-pay

## 2019-07-18 ENCOUNTER — Inpatient Hospital Stay: Payer: PPO

## 2019-07-18 ENCOUNTER — Inpatient Hospital Stay: Payer: PPO | Attending: Oncology | Admitting: Oncology

## 2019-07-18 VITALS — BP 105/61 | HR 79 | Temp 96.4°F | Resp 16 | Wt 193.0 lb

## 2019-07-18 DIAGNOSIS — R911 Solitary pulmonary nodule: Secondary | ICD-10-CM | POA: Diagnosis not present

## 2019-07-18 DIAGNOSIS — I700 Atherosclerosis of aorta: Secondary | ICD-10-CM | POA: Insufficient documentation

## 2019-07-18 DIAGNOSIS — I10 Essential (primary) hypertension: Secondary | ICD-10-CM | POA: Insufficient documentation

## 2019-07-18 DIAGNOSIS — R590 Localized enlarged lymph nodes: Secondary | ICD-10-CM | POA: Diagnosis not present

## 2019-07-18 DIAGNOSIS — K862 Cyst of pancreas: Secondary | ICD-10-CM | POA: Insufficient documentation

## 2019-07-18 DIAGNOSIS — L270 Generalized skin eruption due to drugs and medicaments taken internally: Secondary | ICD-10-CM

## 2019-07-18 DIAGNOSIS — L309 Dermatitis, unspecified: Secondary | ICD-10-CM | POA: Diagnosis not present

## 2019-07-18 DIAGNOSIS — Z8249 Family history of ischemic heart disease and other diseases of the circulatory system: Secondary | ICD-10-CM | POA: Diagnosis not present

## 2019-07-18 DIAGNOSIS — Z882 Allergy status to sulfonamides status: Secondary | ICD-10-CM | POA: Insufficient documentation

## 2019-07-18 DIAGNOSIS — Z885 Allergy status to narcotic agent status: Secondary | ICD-10-CM | POA: Insufficient documentation

## 2019-07-18 DIAGNOSIS — Z95828 Presence of other vascular implants and grafts: Secondary | ICD-10-CM

## 2019-07-18 DIAGNOSIS — Z85828 Personal history of other malignant neoplasm of skin: Secondary | ICD-10-CM | POA: Diagnosis not present

## 2019-07-18 DIAGNOSIS — Z79899 Other long term (current) drug therapy: Secondary | ICD-10-CM | POA: Diagnosis not present

## 2019-07-18 DIAGNOSIS — C669 Malignant neoplasm of unspecified ureter: Secondary | ICD-10-CM

## 2019-07-18 DIAGNOSIS — C679 Malignant neoplasm of bladder, unspecified: Secondary | ICD-10-CM | POA: Diagnosis not present

## 2019-07-18 DIAGNOSIS — K5730 Diverticulosis of large intestine without perforation or abscess without bleeding: Secondary | ICD-10-CM | POA: Insufficient documentation

## 2019-07-18 DIAGNOSIS — Z8042 Family history of malignant neoplasm of prostate: Secondary | ICD-10-CM | POA: Diagnosis not present

## 2019-07-18 DIAGNOSIS — K8020 Calculus of gallbladder without cholecystitis without obstruction: Secondary | ICD-10-CM | POA: Diagnosis not present

## 2019-07-18 DIAGNOSIS — N400 Enlarged prostate without lower urinary tract symptoms: Secondary | ICD-10-CM | POA: Diagnosis not present

## 2019-07-18 DIAGNOSIS — N179 Acute kidney failure, unspecified: Secondary | ICD-10-CM | POA: Insufficient documentation

## 2019-07-18 DIAGNOSIS — R7989 Other specified abnormal findings of blood chemistry: Secondary | ICD-10-CM

## 2019-07-18 DIAGNOSIS — M1990 Unspecified osteoarthritis, unspecified site: Secondary | ICD-10-CM | POA: Insufficient documentation

## 2019-07-18 DIAGNOSIS — Z87442 Personal history of urinary calculi: Secondary | ICD-10-CM | POA: Insufficient documentation

## 2019-07-18 LAB — CBC WITH DIFFERENTIAL/PLATELET
Abs Immature Granulocytes: 0.02 10*3/uL (ref 0.00–0.07)
Basophils Absolute: 0.1 10*3/uL (ref 0.0–0.1)
Basophils Relative: 1 %
Eosinophils Absolute: 0.3 10*3/uL (ref 0.0–0.5)
Eosinophils Relative: 7 %
HCT: 38.6 % — ABNORMAL LOW (ref 39.0–52.0)
Hemoglobin: 13.8 g/dL (ref 13.0–17.0)
Immature Granulocytes: 0 %
Lymphocytes Relative: 26 %
Lymphs Abs: 1.2 10*3/uL (ref 0.7–4.0)
MCH: 30.9 pg (ref 26.0–34.0)
MCHC: 35.8 g/dL (ref 30.0–36.0)
MCV: 86.4 fL (ref 80.0–100.0)
Monocytes Absolute: 0.4 10*3/uL (ref 0.1–1.0)
Monocytes Relative: 9 %
Neutro Abs: 2.7 10*3/uL (ref 1.7–7.7)
Neutrophils Relative %: 57 %
Platelets: 310 10*3/uL (ref 150–400)
RBC: 4.47 MIL/uL (ref 4.22–5.81)
RDW: 14 % (ref 11.5–15.5)
WBC: 4.8 10*3/uL (ref 4.0–10.5)
nRBC: 0 % (ref 0.0–0.2)

## 2019-07-18 LAB — COMPREHENSIVE METABOLIC PANEL
ALT: 36 U/L (ref 0–44)
AST: 44 U/L — ABNORMAL HIGH (ref 15–41)
Albumin: 3.7 g/dL (ref 3.5–5.0)
Alkaline Phosphatase: 42 U/L (ref 38–126)
Anion gap: 9 (ref 5–15)
BUN: 19 mg/dL (ref 8–23)
CO2: 28 mmol/L (ref 22–32)
Calcium: 8.9 mg/dL (ref 8.9–10.3)
Chloride: 92 mmol/L — ABNORMAL LOW (ref 98–111)
Creatinine, Ser: 1.34 mg/dL — ABNORMAL HIGH (ref 0.61–1.24)
GFR calc Af Amer: 55 mL/min — ABNORMAL LOW (ref >60)
GFR calc non Af Amer: 48 mL/min — ABNORMAL LOW (ref >60)
Glucose, Bld: 117 mg/dL — ABNORMAL HIGH (ref 70–99)
Potassium: 3.9 mmol/L (ref 3.5–5.1)
Sodium: 129 mmol/L — ABNORMAL LOW (ref 135–145)
Total Bilirubin: 0.9 mg/dL (ref 0.3–1.2)
Total Protein: 6.4 g/dL — ABNORMAL LOW (ref 6.5–8.1)

## 2019-07-18 MED ORDER — HEPARIN SOD (PORK) LOCK FLUSH 100 UNIT/ML IV SOLN
INTRAVENOUS | Status: AC
  Filled 2019-07-18: qty 5

## 2019-07-18 MED ORDER — HYDROXYZINE HCL 25 MG PO TABS: 25.0000 mg | ORAL_TABLET | Freq: Three times a day (TID) | ORAL | 2 refills | Status: DC | PRN

## 2019-07-18 MED ORDER — SODIUM CHLORIDE 0.9% FLUSH
10.0000 mL | INTRAVENOUS | Status: AC | PRN
  Filled 2019-07-18: qty 10

## 2019-07-18 MED ORDER — SODIUM CHLORIDE 0.9 % IV SOLN
Freq: Once | INTRAVENOUS | Status: AC
  Filled 2019-07-18: qty 250

## 2019-07-19 ENCOUNTER — Ambulatory Visit: Payer: PPO | Admitting: Oncology

## 2019-07-19 ENCOUNTER — Other Ambulatory Visit: Payer: PPO

## 2019-07-20 ENCOUNTER — Encounter: Payer: Self-pay | Admitting: Oncology

## 2019-07-20 NOTE — Progress Notes (Signed)
Hematology/Oncology Consult note [facility]  Telephone:([phone] [phone]  Patient Care Team: Idelle Crouch, MD as PCP - General (Internal Medicine) Sindy Guadeloupe, MD as Consulting Physician (Hematology and Oncology) Sindy Guadeloupe, MD as Consulting Physician (Hematology and Oncology) Sindy Guadeloupe, MD as Consulting Physician (Hematology and Oncology)   Name of the patient: Anthony Gonzales  [id number]  04/23/1931   Date of visit: 07/20/19  Diagnosis- Metastatic urothelial carcinoma with possible mets to the obturator node. Indeterminate hilar lymph nodes and LUL lung nodule  Chief complaint/ Reason for visit-routine follow-up of ongoing skin rash  Heme/Onc history: patient is a 86 year old male with past medical history significant for hypertension and long-standing history of superficial bladder cancer for which he sees [name]. He has undergone TURBT as well as ureteroscopy since 2017 along with mitomycin as well in the past. Most recently he underwent CT abdomen on 08/06/2017 which showed a soft tissue mass in the right renal pelvis concerning for upper tract urothelial neoplasm. Soft tissue fullness at the right ureterovesical junction with proximal right hydroureteronephrosis. Several millimeter attenuation lesion in the pancreatic head.  He underwent diagnostic ureteroscopy and was found to have 2 high-grade lesions within his right kidney. There was a nodular high-grade appearing lesion in the right anterior renal pelvis. There was also a second ureteral tumor fungating from the right ureteral orifice extending into the distal ureter also consistent with high-grade invasive urothelial carcinoma. Muscle invasion could not be assessed. He also underwent a CT chest which showed a rounded nodule in the right upper lobe measuring 14 mm concerning for metastases. 2 other 3 mm lesions were also noted in the left upper lobe likely  benign  Plan initially was neoadjuvant chemotherapy followed by possible surgery but given the presence of lung lesion patient has been referred to oncology for the same.  PET/CT on 09/21/17 showed:IMPRESSION: 1. Hypermetabolic right obturator lymph node is most indicative of metastatic disease. 2. Mildly hypermetabolic left hilar lymph nodes are nonspecific. Continued attention on follow-up exams is warranted. 3. Right upper lobe pulmonary nodule shows metabolism after just above blood pool and is therefore indeterminate. Continued attention on follow-up exams is warranted. 4. Aortic atherosclerosis (ICD10-170.0). Coronary artery calcification. 5. Cholelithiasis  Carboplatin/ gemzar1 week on and one-week off as patient could not tolerate 2-week on and one week off regimen. Cycle 1 started on 09/22/2017 Disease progression in March 2020. Switched to second [employer]  Patient had disease controlled with Keytruda. However he was noted to have worsening dermatitis with constant flareups requiring steroids. Beryle Flock is therefore being kept on hold and patient will be switched to third line Padcev.  Treatment has been on hold since February 2021 due to constant flareup of skin rash   Interval history-patient continues to have occasional flares of his skin rash but it is relatively better after he stopped Padcev.  He is currently on weekly methotrexate and has not required any oral steroids for a while.  He continues to use topical steroids   ECOG PS- 1 Pain scale- 0   Review of systems- Review of Systems  Constitutional: Positive for malaise/fatigue. Negative for chills, fever and weight loss.  HENT: Negative for congestion, ear discharge and nosebleeds.   Eyes: Negative for blurred vision.  Respiratory: Negative for cough, hemoptysis, sputum production, shortness of breath and wheezing.   Cardiovascular: Negative for chest pain, palpitations, orthopnea and claudication.    Gastrointestinal: Negative for abdominal pain, blood  in stool, constipation, diarrhea, heartburn, melena, nausea and vomiting.  Genitourinary: Negative for dysuria, flank pain, frequency, hematuria and urgency.  Musculoskeletal: Negative for back pain, joint pain and myalgias.  Skin: Positive for rash.  Neurological: Negative for dizziness, tingling, focal weakness, seizures, weakness and headaches.  Endo/Heme/Allergies: Does not bruise/bleed easily.  Psychiatric/Behavioral: Negative for depression and suicidal ideas. The patient does not have insomnia.        Allergies  Allergen Reactions  . Demerol [Meperidine] Nausea And Vomiting  . Lipitor [Atorvastatin] Swelling  . Sulfa Antibiotics Nausea And Vomiting and Rash     Past Medical History:  Diagnosis Date  . Arthritis   . Benign fibroma of prostate 08/23/2013  . BPH (benign prostatic hyperplasia)   . Calculus of kidney 08/23/2013  . Glaucoma    no drops in 3 mo pressure good, pt denies glaucoma, eye pressure has been measuring alright.  . History of kidney stones   . HLD (hyperlipidemia)   . HOH (hard of hearing)    Left Hearing Aid  . HTN (hypertension) 12/26/2014  . Hypertension   . Hyponatremia 12/26/2014  . Migraines    history of migraines when he was younger.  [name] Restless leg syndrome   . Sinus drainage   . Skin cancer   . Skin cancer    left hand 06/2019  . Urothelial cancer ([place])    chemo tx's.  [name] UTI (lower urinary tract infection) 12/26/2014  . Vertigo      Past Surgical History:  Procedure Laterality Date  . COLONOSCOPY    . CYSTOSCOPY W/ RETROGRADES Right 01/30/2015   Procedure: CYSTOSCOPY WITH RETROGRADE PYELOGRAM;  Surgeon: [name], MD;  Location: [facility];  Service: Urology;  Laterality: Right;  . CYSTOSCOPY W/ RETROGRADES Bilateral 02/26/2016   Procedure: CYSTOSCOPY WITH RETROGRADE PYELOGRAM;  Surgeon: [name], MD;  Location: [facility];  Service: Urology;  Laterality: Bilateral;  .  CYSTOSCOPY W/ URETERAL STENT PLACEMENT Right 08/20/2015   Procedure: CYSTOSCOPY WITH RETROGRADE PYELOGRAM/POSSIBLE URETERAL STENT PLACEMENT/BLADDER BIOPSY;  Surgeon: [name], MD;  Location: [facility];  Service: Urology;  Laterality: Right;  . CYSTOSCOPY W/ URETERAL STENT PLACEMENT Right 09/12/2015   Procedure: CYSTOSCOPY WITH STENT REPLACEMENT;  Surgeon: [name], MD;  Location: [facility];  Service: Urology;  Laterality: Right;  . CYSTOSCOPY WITH BIOPSY Right 09/12/2015   Procedure: CYSTOSCOPY WITH BLADDER AND URETERAL BIOPSY;  Surgeon: [name], MD;  Location: [facility];  Service: Urology;  Laterality: Right;  . CYSTOSCOPY WITH STENT PLACEMENT Right 01/30/2015   Procedure: CYSTOSCOPY WITH STENT PLACEMENT;  Surgeon: [name], MD;  Location: [facility];  Service: Urology;  Laterality: Right;  . CYSTOSCOPY WITH STENT PLACEMENT Right 12/28/2017   Procedure: [place] WITH STENT Exchange;  Surgeon: [name], MD;  Location: [facility];  Service: Urology;  Laterality: Right;  . CYSTOSCOPY/URETEROSCOPY/HOLMIUM LASER/STENT PLACEMENT Right 08/19/2017   Procedure: CYSTOSCOPY/URETEROSCOPY/HOLMIUM LASER/STENT PLACEMENT;  Surgeon: [name], MD;  Location: [facility];  Service: Urology;  Laterality: Right;  . EYE SURGERY Bilateral    Cataract Extraction with IOL  . goiter removal    . HOLMIUM LASER APPLICATION N/A 2/68/3419   Procedure:  HOLMIUM LASER APPLICATION;  Surgeon: [name], MD;  Location: [facility];  Service: Urology;  Laterality: N/A;  . PORTA CATH INSERTION N/A 09/23/2017   Procedure: PORTA CATH INSERTION;  Surgeon: Algernon Huxley, MD;  Location: [facility] [facility];  Service: Cardiovascular;  Laterality: N/A;  . SPERMATOCELECTOMY    . TONSILLECTOMY    .  TRANSURETHRAL RESECTION OF BLADDER TUMOR N/A 02/26/2016   Procedure: TRANSURETHRAL RESECTION OF BLADDER TUMOR (TURBT);  Surgeon: [name], MD;  Location: [facility];  Service: Urology;  Laterality: N/A;  .  TRANSURETHRAL RESECTION OF BLADDER TUMOR WITH MITOMYCIN-C N/A 09/12/2015   Procedure: TRANSURETHRAL RESECTION OF BLADDER TUMOR ;  Surgeon: [name], MD;  Location: [facility];  Service: Urology;  Laterality: N/A;  . TRANSURETHRAL RESECTION OF BLADDER TUMOR WITH MITOMYCIN-C N/A 03/24/2016   Procedure: TRANSURETHRAL RESECTION OF BLADDER TUMOR WITH MITOMYCIN-C  (SMALL);  Surgeon: [name], MD;  Location: [facility];  Service: Urology;  Laterality: N/A;  . URETERAL BIOPSY Right 08/19/2017   Procedure: Renal Mass BIOPSY;  Surgeon: [name], MD;  Location: [facility];  Service: Urology;  Laterality: Right;  . URETEROSCOPY Right 01/30/2015   Procedure: URETEROSCOPY/ WITH BIOPSY AND CYTOLOGY BRUSHING;  Surgeon: [name], MD;  Location: [facility];  Service: Urology;  Laterality: Right;  . URETEROSCOPY Right 08/20/2015   Procedure: URETEROSCOPY;  Surgeon: [name], MD;  Location: [facility];  Service: Urology;  Laterality: Right;  . URETEROSCOPY Right 09/12/2015   Procedure: URETEROSCOPY;  Surgeon: [name], MD;  Location: [facility];  Service: Urology;  Laterality: Right;  . URETEROSCOPY Right 02/26/2016   Procedure: URETEROSCOPY;  Surgeon: [name], MD;  Location: [facility];  Service: Urology;  Laterality: Right;    Social History   Socioeconomic History  . Marital status: Married    Spouse name: Not on file  . Number of children: Not on file  . Years of education: Not on file  . Highest education level: Not on file  Occupational History  . Not on file  Tobacco Use  . Smoking status: Never Smoker  . Smokeless tobacco: Never Used  Substance and Sexual Activity  . Alcohol use: No    Alcohol/week: 0.0 standard drinks  . Drug use: No  . Sexual activity: Not on file  Other Topics Concern  . Not on file  Social History Narrative  . Not on file   Social Determinants of Health   Financial Resource Strain:   . Difficulty of Paying Living Expenses:   Food Insecurity:    . Worried About Charity fundraiser in the Last Year:   . Arboriculturist in the Last Year:   Transportation Needs:   . Film/video editor (Medical):   [name] Lack of Transportation (Non-Medical):   Physical Activity:   . Days of Exercise per Week:   . Minutes of Exercise per Session:   Stress:   . Feeling of Stress :   Social Connections:   . Frequency of Communication with Friends and Family:   . Frequency of Social Gatherings with Friends and Family:   . Attends Religious Services:   . Active Member of Clubs or Organizations:   . Attends Archivist Meetings:   [name] Marital Status:   Intimate Partner Violence:   . Fear of Current or Ex-Partner:   . Emotionally Abused:   [name] Physically Abused:   . Sexually Abused:     Family History  Problem Relation Age of Onset  . Hypertension Mother   . Hypertension Father   . Prostate cancer Brother   . Kidney disease Neg Hx   . Kidney cancer Neg Hx   . Bladder Cancer Neg Hx      Current Outpatient Medications:  .  amLODipine (NORVASC) 5 MG tablet, Take 5 mg by mouth daily. , Disp: , Rfl:  .  benazepril-hydrochlorthiazide (LOTENSIN HCT) 20-12.5 MG tablet, Take 1 tablet by mouth daily. , Disp: , Rfl:  .  camphor-menthol (SARNA) lotion, Apply 1 application topically as needed for itching., Disp: , Rfl:  .  diazepam (VALIUM) 5 MG tablet, TAKE 1 TABLET (5 MG TOTAL) BY MOUTH EVERY 12 (TWELVE) HOURS AS NEEDED FOR ANXIETY OR SLEEP, Disp: , Rfl:  .  fenofibrate micronized (LOFIBRA) 134 MG capsule, Take 134 mg by mouth daily before breakfast. , Disp: , Rfl:  .  folic acid (FOLVITE) 1 MG tablet, Take 1 mg by mouth as directed. Take it daily on Friday to wed only each week, Disp: , Rfl:  .  methotrexate 2.5 MG tablet, Take 10 mg by mouth once a week. Caution:Chemotherapy. Protect from light., Disp: , Rfl:  .  Multiple Vitamin (MULTIVITAMIN WITH MINERALS) TABS tablet, Take 1 tablet by mouth daily., Disp: , Rfl:  .  psyllium (METAMUCIL) 58.6  % packet, Take 1 packet by mouth daily. , Disp: , Rfl:  .  tamsulosin (FLOMAX) 0.4 MG CAPS capsule, Take 1 capsule (0.4 mg total) by mouth daily., Disp: 90 capsule, Rfl: 3 .  triamcinolone cream (KENALOG) 0.1 %, Apply 1 application topically 2 (two) times daily., Disp: , Rfl:  .  acetaminophen (TYLENOL) 500 MG tablet, Take 1,000 mg by mouth daily as needed for moderate pain. , Disp: , Rfl:  .  docusate sodium (COLACE) 100 MG capsule, Take 100 mg by mouth 2 (two) times daily. , Disp: , Rfl:  .  HYDROcodone-acetaminophen (NORCO/VICODIN) 5-325 MG tablet, Take 1 tablet by mouth every 6 (six) hours as needed for severe pain. (Patient not taking: Reported on 07/05/2019), Disp: 30 tablet, Rfl: 0 .  hydrOXYzine (ATARAX/VISTARIL) 25 MG tablet, Take 1 tablet (25 mg total) by mouth 3 (three) times daily as needed., Disp: 45 tablet, Rfl: 2 .  nystatin (MYCOSTATIN/NYSTOP) powder, APPLY TO AFFECTED AREA 4 TIMES A DAY (Patient not taking: No sig reported), Disp: 15 g, Rfl: 0 .  polyethylene glycol (MIRALAX / GLYCOLAX) 17 g packet, Take 17 g by mouth daily as needed. , Disp: , Rfl:  .  Potassium 99 MG TABS, Take 99 mg by mouth at bedtime., Disp: , Rfl:  No current facility-administered medications for this visit.  Facility-Administered Medications Ordered in Other Visits:  .  sodium chloride flush (NS) 0.9 % injection 10 mL, 10 mL, Intravenous, Once, Randa Evens C, MD .  sodium chloride flush (NS) 0.9 % injection 10 mL, 10 mL, Intravenous, PRN, Sindy Guadeloupe, MD  Physical exam:  Vitals:   07/18/19 0934  BP: 105/61  Pulse: 79  Resp: 16  Temp: (!) 96.4 F (35.8 C)  TempSrc: Tympanic  Weight: 193 lb (87.5 kg)   Physical Exam Constitutional:      General: He is not in acute distress. Cardiovascular:     Rate and Rhythm: Normal rate and regular rhythm.     Heart sounds: Normal heart sounds.  Pulmonary:     Effort: Pulmonary effort is normal.     Breath sounds: Normal breath sounds.  Abdominal:      General: Bowel sounds are normal.     Palpations: Abdomen is soft.  Skin:    General: Skin is warm and dry.     Comments: Scattered areas of maculopapular skin rash noted mainly over the lower back and a few areas seen over anterior abdominal wall  Neurological:     Mental Status: He is alert and oriented to person, place,  and time.      CMP Latest Ref Rng & Units 07/18/2019  Glucose 70 - 99 mg/dL 117(H)  BUN 8 - 23 mg/dL 19  Creatinine 0.61 - 1.24 mg/dL 1.34(H)  Sodium 135 - 145 mmol/L 129(L)  Potassium 3.5 - 5.1 mmol/L 3.9  Chloride 98 - 111 mmol/L 92(L)  CO2 22 - 32 mmol/L 28  Calcium 8.9 - 10.3 mg/dL 8.9  Total Protein 6.5 - 8.1 g/dL 6.4(L)  Total Bilirubin 0.3 - 1.2 mg/dL 0.9  Alkaline Phos 38 - 126 U/L 42  AST 15 - 41 U/L 44(H)  ALT 0 - 44 U/L 36   CBC Latest Ref Rng & Units 07/18/2019  WBC 4.0 - 10.5 K/uL 4.8  Hemoglobin 13.0 - 17.0 g/dL 13.8  Hematocrit 39.0 - 52.0 % 38.6(L)  Platelets 150 - 400 K/uL 310    No images are attached to the encounter.  CT Chest W Contrast  Result Date: 07/12/2019 CLINICAL DATA:  Urothelial carcinoma. Restaging. EXAM: CT CHEST, ABDOMEN, AND PELVIS WITH CONTRAST TECHNIQUE: Multidetector CT imaging of the chest, abdomen and pelvis was performed following the standard protocol during bolus administration of intravenous contrast. CONTRAST:  164mL OMNIPAQUE IOHEXOL 300 MG/ML  SOLN COMPARISON:  05/12/2019 FINDINGS: CT CHEST FINDINGS Cardiovascular: The heart size is normal. No substantial pericardial effusion. Coronary artery calcification is evident. Atherosclerotic calcification is noted in the wall of the thoracic aorta. Right Port-A-Cath tip is positioned in the mid SVC. Possible small fibrin sheath at the catheter tip. Mediastinum/Nodes: No mediastinal lymphadenopathy. There is no hilar lymphadenopathy. The esophagus has normal imaging features. There is no axillary lymphadenopathy. Lungs/Pleura: 14 mm right upper lobe nodule on 86/3 is stable in  the interval. Additional tiny bilateral pulmonary nodules are also stable. No new nodule or mass. No focal airspace consolidation. No pleural effusion. Musculoskeletal: No worrisome lytic or sclerotic osseous abnormality. CT ABDOMEN PELVIS FINDINGS Hepatobiliary: No suspicious focal abnormality within the liver parenchyma. Calcified gallstones evident. No intrahepatic or extrahepatic biliary dilation. Pancreas: 11 mm cystic lesion in the uncinate process of the pancreas has decreased in the interval from 16 mm previously. Areas of parenchymal atrophy again noted. Spleen: No splenomegaly. No focal mass lesion. Adrenals/Urinary Tract: No adrenal nodule or mass. Stable cyst lower pole right kidney. Tiny hypoattenuating lesion interpolar left kidney is too small to characterize but likely benign. No evidence for hydroureter. Posterior left bladder wall diverticulum noted. Bladder otherwise unremarkable. Stomach/Bowel: Stomach is unremarkable. No gastric wall thickening. No evidence of outlet obstruction. Duodenum is normally positioned as is the ligament of Treitz. No small bowel wall thickening. No small bowel dilatation. The terminal ileum is normal. The appendix is normal. No gross colonic mass. No colonic wall thickening. Diverticular changes are noted in the left colon without evidence of diverticulitis. Vascular/Lymphatic: There is abdominal aortic atherosclerosis without aneurysm. There is no gastrohepatic or hepatoduodenal ligament lymphadenopathy. No retroperitoneal or mesenteric lymphadenopathy. No pelvic sidewall lymphadenopathy. Reproductive: The prostate gland and seminal vesicles are unremarkable. Other: No intraperitoneal free fluid. Musculoskeletal: No worrisome lytic or sclerotic osseous abnormality. IMPRESSION: 1. Stable exam. No new or progressive findings in the chest, abdomen, or pelvis. 2. Stable bilateral pulmonary nodules. Dominant nodule again noted right upper lobe measuring 14 mm. 3. Interval  decrease in size of the 11 mm cystic lesion in the uncinate process of the pancreas. Continued attention on follow-up recommended. 4. Cholelithiasis. 5. Left colonic diverticulosis without diverticulitis. 6. Aortic Atherosclerosis (ICD10-I70.0). Electronically Signed   By: Misty Stanley  M.D.   On: 07/12/2019 12:37   CT Abdomen Pelvis W Contrast  Result Date: 07/12/2019 CLINICAL DATA:  Urothelial carcinoma. Restaging. EXAM: CT CHEST, ABDOMEN, AND PELVIS WITH CONTRAST TECHNIQUE: Multidetector CT imaging of the chest, abdomen and pelvis was performed following the standard protocol during bolus administration of intravenous contrast. CONTRAST:  135mL OMNIPAQUE IOHEXOL 300 MG/ML  SOLN COMPARISON:  05/12/2019 FINDINGS: CT CHEST FINDINGS Cardiovascular: The heart size is normal. No substantial pericardial effusion. Coronary artery calcification is evident. Atherosclerotic calcification is noted in the wall of the thoracic aorta. Right Port-A-Cath tip is positioned in the mid SVC. Possible small fibrin sheath at the catheter tip. Mediastinum/Nodes: No mediastinal lymphadenopathy. There is no hilar lymphadenopathy. The esophagus has normal imaging features. There is no axillary lymphadenopathy. Lungs/Pleura: 14 mm right upper lobe nodule on 86/3 is stable in the interval. Additional tiny bilateral pulmonary nodules are also stable. No new nodule or mass. No focal airspace consolidation. No pleural effusion. Musculoskeletal: No worrisome lytic or sclerotic osseous abnormality. CT ABDOMEN PELVIS FINDINGS Hepatobiliary: No suspicious focal abnormality within the liver parenchyma. Calcified gallstones evident. No intrahepatic or extrahepatic biliary dilation. Pancreas: 11 mm cystic lesion in the uncinate process of the pancreas has decreased in the interval from 16 mm previously. Areas of parenchymal atrophy again noted. Spleen: No splenomegaly. No focal mass lesion. Adrenals/Urinary Tract: No adrenal nodule or mass. Stable  cyst lower pole right kidney. Tiny hypoattenuating lesion interpolar left kidney is too small to characterize but likely benign. No evidence for hydroureter. Posterior left bladder wall diverticulum noted. Bladder otherwise unremarkable. Stomach/Bowel: Stomach is unremarkable. No gastric wall thickening. No evidence of outlet obstruction. Duodenum is normally positioned as is the ligament of Treitz. No small bowel wall thickening. No small bowel dilatation. The terminal ileum is normal. The appendix is normal. No gross colonic mass. No colonic wall thickening. Diverticular changes are noted in the left colon without evidence of diverticulitis. Vascular/Lymphatic: There is abdominal aortic atherosclerosis without aneurysm. There is no gastrohepatic or hepatoduodenal ligament lymphadenopathy. No retroperitoneal or mesenteric lymphadenopathy. No pelvic sidewall lymphadenopathy. Reproductive: The prostate gland and seminal vesicles are unremarkable. Other: No intraperitoneal free fluid. Musculoskeletal: No worrisome lytic or sclerotic osseous abnormality. IMPRESSION: 1. Stable exam. No new or progressive findings in the chest, abdomen, or pelvis. 2. Stable bilateral pulmonary nodules. Dominant nodule again noted right upper lobe measuring 14 mm. 3. Interval decrease in size of the 11 mm cystic lesion in the uncinate process of the pancreas. Continued attention on follow-up recommended. 4. Cholelithiasis. 5. Left colonic diverticulosis without diverticulitis. 6. Aortic Atherosclerosis (ICD10-I70.0). Electronically Signed   By: Misty Stanley M.D.   On: 07/12/2019 12:37     Assessment and plan- Patient is a 86 y.o. male withmetastatic urothelial carcinoma with metastases to the lymph nodes.  He is here to discuss results of CT scans and ongoing skin rash  Skin rash:Continue weekly methotrexate as a steroid sparing medication.  He is not on oral steroids right now but will continue to use topical steroids and as  needed hydroxyzine  I have reviewed CT chest abdomen pelvis images independently and discussed findings with the patient.Patient has currently been off Padcev treatment since 05/03/2019 due to his ongoing skin rash.  Present scan show evidence of stable disease.  Lung nodules are currently stable no retroperitoneal or mesenteric or pelvic sidewall adenopathy noted.  At this time inclined to hold all treatment and consider palliative chemotherapy after progression since patient gets severe skin rash with  both Keytruda and Padcev  AKI: Today creatinine is elevated at 1.3.  Baseline creatinine is normal at 1.  I will plan to give him 1 L of IV fluids today.  Cytopenias can be worse in the setting of renal failure and methotrexate use.   I will see him back in 1 month with labs Visit Diagnosis 1. Drug-induced skin rash   2. AKI (acute kidney injury) ([place])   3. 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 Randa Evens, MD, MPH [facility] [phone] 07/20/2019 9:21 AM

## 2019-07-26 ENCOUNTER — Other Ambulatory Visit: Payer: Self-pay | Admitting: Oncology

## 2019-08-01 ENCOUNTER — Inpatient Hospital Stay: Payer: PPO

## 2019-08-01 ENCOUNTER — Other Ambulatory Visit: Payer: Self-pay

## 2019-08-01 VITALS — BP 129/68 | HR 79 | Temp 97.3°F | Resp 17

## 2019-08-01 DIAGNOSIS — C669 Malignant neoplasm of unspecified ureter: Secondary | ICD-10-CM

## 2019-08-01 DIAGNOSIS — Z95828 Presence of other vascular implants and grafts: Secondary | ICD-10-CM

## 2019-08-01 DIAGNOSIS — C679 Malignant neoplasm of bladder, unspecified: Secondary | ICD-10-CM | POA: Diagnosis not present

## 2019-08-01 LAB — CBC WITH DIFFERENTIAL/PLATELET
Abs Immature Granulocytes: 0.02 10*3/uL (ref 0.00–0.07)
Basophils Absolute: 0.1 10*3/uL (ref 0.0–0.1)
Basophils Relative: 1 %
Eosinophils Absolute: 0.4 10*3/uL (ref 0.0–0.5)
Eosinophils Relative: 6 %
HCT: 37.4 % — ABNORMAL LOW (ref 39.0–52.0)
Hemoglobin: 13.3 g/dL (ref 13.0–17.0)
Immature Granulocytes: 0 %
Lymphocytes Relative: 22 %
Lymphs Abs: 1.2 10*3/uL (ref 0.7–4.0)
MCH: 31.1 pg (ref 26.0–34.0)
MCHC: 35.6 g/dL (ref 30.0–36.0)
MCV: 87.6 fL (ref 80.0–100.0)
Monocytes Absolute: 0.4 10*3/uL (ref 0.1–1.0)
Monocytes Relative: 7 %
Neutro Abs: 3.5 10*3/uL (ref 1.7–7.7)
Neutrophils Relative %: 64 %
Platelets: 236 10*3/uL (ref 150–400)
RBC: 4.27 MIL/uL (ref 4.22–5.81)
RDW: 14 % (ref 11.5–15.5)
WBC: 5.5 10*3/uL (ref 4.0–10.5)
nRBC: 0 % (ref 0.0–0.2)

## 2019-08-01 LAB — COMPREHENSIVE METABOLIC PANEL
ALT: 30 U/L (ref 0–44)
AST: 41 U/L (ref 15–41)
Albumin: 3.6 g/dL (ref 3.5–5.0)
Alkaline Phosphatase: 45 U/L (ref 38–126)
Anion gap: 9 (ref 5–15)
BUN: 17 mg/dL (ref 8–23)
CO2: 26 mmol/L (ref 22–32)
Calcium: 8.7 mg/dL — ABNORMAL LOW (ref 8.9–10.3)
Chloride: 97 mmol/L — ABNORMAL LOW (ref 98–111)
Creatinine, Ser: 1.21 mg/dL (ref 0.61–1.24)
GFR calc Af Amer: >60 mL/min (ref >60)
GFR calc non Af Amer: 54 mL/min — ABNORMAL LOW (ref >60)
Glucose, Bld: 157 mg/dL — ABNORMAL HIGH (ref 70–99)
Potassium: 4 mmol/L (ref 3.5–5.1)
Sodium: 132 mmol/L — ABNORMAL LOW (ref 135–145)
Total Bilirubin: 0.7 mg/dL (ref 0.3–1.2)
Total Protein: 6.2 g/dL — ABNORMAL LOW (ref 6.5–8.1)

## 2019-08-01 MED ORDER — HEPARIN SOD (PORK) LOCK FLUSH 100 UNIT/ML IV SOLN
500.0000 [IU] | Freq: Once | INTRAVENOUS | Status: AC
  Administered 2019-08-01: 500 [IU] via INTRAVENOUS
  Filled 2019-08-01: qty 5

## 2019-08-01 MED ORDER — SODIUM CHLORIDE 0.9% FLUSH
10.0000 mL | INTRAVENOUS | Status: DC | PRN
  Administered 2019-08-01: 10 mL via INTRAVENOUS
  Filled 2019-08-01: qty 10

## 2019-08-01 MED ORDER — HEPARIN SOD (PORK) LOCK FLUSH 100 UNIT/ML IV SOLN
INTRAVENOUS | Status: AC
  Filled 2019-08-01: qty 5

## 2019-08-01 NOTE — Progress Notes (Signed)
Reviewed labs and patient assessment with MD. Per Dr. Janese Banks no IVFs needed at this time, okay to discharge pt home. Pt aware and in agreement with plan. Pt stable at discharge.

## 2019-08-10 DIAGNOSIS — R739 Hyperglycemia, unspecified: Secondary | ICD-10-CM | POA: Diagnosis not present

## 2019-08-10 DIAGNOSIS — Z79899 Other long term (current) drug therapy: Secondary | ICD-10-CM | POA: Diagnosis not present

## 2019-08-10 DIAGNOSIS — I10 Essential (primary) hypertension: Secondary | ICD-10-CM | POA: Diagnosis not present

## 2019-08-10 DIAGNOSIS — T466X5A Adverse effect of antihyperlipidemic and antiarteriosclerotic drugs, initial encounter: Secondary | ICD-10-CM | POA: Diagnosis not present

## 2019-08-10 DIAGNOSIS — G720 Drug-induced myopathy: Secondary | ICD-10-CM | POA: Diagnosis not present

## 2019-08-10 DIAGNOSIS — C649 Malignant neoplasm of unspecified kidney, except renal pelvis: Secondary | ICD-10-CM | POA: Diagnosis not present

## 2019-08-10 DIAGNOSIS — E782 Mixed hyperlipidemia: Secondary | ICD-10-CM | POA: Diagnosis not present

## 2019-08-10 DIAGNOSIS — Z0000 Encounter for general adult medical examination without abnormal findings: Secondary | ICD-10-CM | POA: Diagnosis not present

## 2019-08-15 ENCOUNTER — Inpatient Hospital Stay: Payer: PPO

## 2019-08-15 ENCOUNTER — Inpatient Hospital Stay: Payer: PPO | Attending: Oncology

## 2019-08-15 ENCOUNTER — Encounter: Payer: Self-pay | Admitting: Oncology

## 2019-08-15 ENCOUNTER — Inpatient Hospital Stay (HOSPITAL_BASED_OUTPATIENT_CLINIC_OR_DEPARTMENT_OTHER): Payer: PPO | Admitting: Oncology

## 2019-08-15 ENCOUNTER — Other Ambulatory Visit: Payer: Self-pay

## 2019-08-15 VITALS — BP 114/60 | HR 69 | Temp 98.3°F | Resp 16 | Wt 197.9 lb

## 2019-08-15 DIAGNOSIS — Z8042 Family history of malignant neoplasm of prostate: Secondary | ICD-10-CM | POA: Diagnosis not present

## 2019-08-15 DIAGNOSIS — C680 Malignant neoplasm of urethra: Secondary | ICD-10-CM | POA: Insufficient documentation

## 2019-08-15 DIAGNOSIS — L309 Dermatitis, unspecified: Secondary | ICD-10-CM | POA: Diagnosis not present

## 2019-08-15 DIAGNOSIS — Z85828 Personal history of other malignant neoplasm of skin: Secondary | ICD-10-CM | POA: Diagnosis not present

## 2019-08-15 DIAGNOSIS — K8020 Calculus of gallbladder without cholecystitis without obstruction: Secondary | ICD-10-CM | POA: Insufficient documentation

## 2019-08-15 DIAGNOSIS — C779 Secondary and unspecified malignant neoplasm of lymph node, unspecified: Secondary | ICD-10-CM | POA: Diagnosis not present

## 2019-08-15 DIAGNOSIS — L270 Generalized skin eruption due to drugs and medicaments taken internally: Secondary | ICD-10-CM | POA: Diagnosis not present

## 2019-08-15 DIAGNOSIS — N179 Acute kidney failure, unspecified: Secondary | ICD-10-CM

## 2019-08-15 DIAGNOSIS — Z8551 Personal history of malignant neoplasm of bladder: Secondary | ICD-10-CM | POA: Diagnosis not present

## 2019-08-15 DIAGNOSIS — C669 Malignant neoplasm of unspecified ureter: Secondary | ICD-10-CM

## 2019-08-15 DIAGNOSIS — Z882 Allergy status to sulfonamides status: Secondary | ICD-10-CM | POA: Diagnosis not present

## 2019-08-15 DIAGNOSIS — Z8249 Family history of ischemic heart disease and other diseases of the circulatory system: Secondary | ICD-10-CM | POA: Diagnosis not present

## 2019-08-15 DIAGNOSIS — R911 Solitary pulmonary nodule: Secondary | ICD-10-CM | POA: Diagnosis not present

## 2019-08-15 DIAGNOSIS — I700 Atherosclerosis of aorta: Secondary | ICD-10-CM | POA: Insufficient documentation

## 2019-08-15 DIAGNOSIS — Z885 Allergy status to narcotic agent status: Secondary | ICD-10-CM | POA: Diagnosis not present

## 2019-08-15 DIAGNOSIS — I10 Essential (primary) hypertension: Secondary | ICD-10-CM | POA: Diagnosis not present

## 2019-08-15 DIAGNOSIS — Z95828 Presence of other vascular implants and grafts: Secondary | ICD-10-CM

## 2019-08-15 DIAGNOSIS — Z87442 Personal history of urinary calculi: Secondary | ICD-10-CM | POA: Insufficient documentation

## 2019-08-15 DIAGNOSIS — I2510 Atherosclerotic heart disease of native coronary artery without angina pectoris: Secondary | ICD-10-CM | POA: Insufficient documentation

## 2019-08-15 DIAGNOSIS — Z79899 Other long term (current) drug therapy: Secondary | ICD-10-CM | POA: Diagnosis not present

## 2019-08-15 DIAGNOSIS — N400 Enlarged prostate without lower urinary tract symptoms: Secondary | ICD-10-CM | POA: Insufficient documentation

## 2019-08-15 DIAGNOSIS — Z87440 Personal history of urinary (tract) infections: Secondary | ICD-10-CM | POA: Insufficient documentation

## 2019-08-15 DIAGNOSIS — Z5181 Encounter for therapeutic drug level monitoring: Secondary | ICD-10-CM

## 2019-08-15 LAB — BASIC METABOLIC PANEL
Anion gap: 7 (ref 5–15)
BUN: 19 mg/dL (ref 8–23)
CO2: 29 mmol/L (ref 22–32)
Calcium: 8.8 mg/dL — ABNORMAL LOW (ref 8.9–10.3)
Chloride: 96 mmol/L — ABNORMAL LOW (ref 98–111)
Creatinine, Ser: 1.17 mg/dL (ref 0.61–1.24)
GFR calc Af Amer: >60 mL/min (ref >60)
GFR calc non Af Amer: 56 mL/min — ABNORMAL LOW (ref >60)
Glucose, Bld: 116 mg/dL — ABNORMAL HIGH (ref 70–99)
Potassium: 4.2 mmol/L (ref 3.5–5.1)
Sodium: 132 mmol/L — ABNORMAL LOW (ref 135–145)

## 2019-08-15 LAB — CBC WITH DIFFERENTIAL/PLATELET
Abs Immature Granulocytes: 0.03 10*3/uL (ref 0.00–0.07)
Basophils Absolute: 0.1 10*3/uL (ref 0.0–0.1)
Basophils Relative: 1 %
Eosinophils Absolute: 0.2 10*3/uL (ref 0.0–0.5)
Eosinophils Relative: 5 %
HCT: 37.2 % — ABNORMAL LOW (ref 39.0–52.0)
Hemoglobin: 13 g/dL (ref 13.0–17.0)
Immature Granulocytes: 1 %
Lymphocytes Relative: 18 %
Lymphs Abs: 0.9 10*3/uL (ref 0.7–4.0)
MCH: 30.2 pg (ref 26.0–34.0)
MCHC: 34.9 g/dL (ref 30.0–36.0)
MCV: 86.3 fL (ref 80.0–100.0)
Monocytes Absolute: 0.6 10*3/uL (ref 0.1–1.0)
Monocytes Relative: 11 %
Neutro Abs: 3.2 10*3/uL (ref 1.7–7.7)
Neutrophils Relative %: 64 %
Platelets: 294 10*3/uL (ref 150–400)
RBC: 4.31 MIL/uL (ref 4.22–5.81)
RDW: 14 % (ref 11.5–15.5)
WBC: 5.1 10*3/uL (ref 4.0–10.5)
nRBC: 0 % (ref 0.0–0.2)

## 2019-08-15 MED ORDER — HEPARIN SOD (PORK) LOCK FLUSH 100 UNIT/ML IV SOLN
500.0000 [IU] | Freq: Once | INTRAVENOUS | Status: AC
  Administered 2019-08-15: 500 [IU] via INTRAVENOUS
  Filled 2019-08-15: qty 5

## 2019-08-15 MED ORDER — SODIUM CHLORIDE 0.9% FLUSH
10.0000 mL | Freq: Once | INTRAVENOUS | Status: AC
  Administered 2019-08-15: 10 mL via INTRAVENOUS
  Filled 2019-08-15: qty 10

## 2019-08-15 NOTE — Progress Notes (Signed)
Last week he has a few spots on back more red than a few on his belly. Then wed. The rash came back and has more spots on back and stomach and arms. And a couple of his legs. It is not itching now but sometimes it does. He still puts cream on bid.

## 2019-08-17 DIAGNOSIS — L270 Generalized skin eruption due to drugs and medicaments taken internally: Secondary | ICD-10-CM | POA: Diagnosis not present

## 2019-08-17 NOTE — Progress Notes (Signed)
Hematology/Oncology Consult note [facility]  [phone] [phone]  Patient Care Team: Idelle Crouch, MD as PCP - General (Internal Medicine) Sindy Guadeloupe, MD as Consulting Physician (Hematology and Oncology) Sindy Guadeloupe, MD as Consulting Physician (Hematology and Oncology) Sindy Guadeloupe, MD as Consulting Physician (Hematology and Oncology)   Name of the patient: Anthony Gonzales  [id number]  1931-09-03   Date of visit: 08/17/19  Diagnosis- Metastatic urothelial carcinoma with possible mets to the obturator node. Indeterminate hilar lymph nodes and LUL lung nodule  Chief complaint/ Reason for visit-routine follow-up for ongoing skin rash  Heme/Onc history: patient is a 86 year old male with past medical history significant for hypertension and long-standing history of superficial bladder cancer for which he sees [name]. He has undergone TURBT as well as ureteroscopy since 2017 along with mitomycin as well in the past. Most recently he underwent CT abdomen on 08/06/2017 which showed a soft tissue mass in the right renal pelvis concerning for upper tract urothelial neoplasm. Soft tissue fullness at the right ureterovesical junction with proximal right hydroureteronephrosis. Several millimeter attenuation lesion in the pancreatic head.  He underwent diagnostic ureteroscopy and was found to have 2 high-grade lesions within his right kidney. There was a nodular high-grade appearing lesion in the right anterior renal pelvis. There was also a second ureteral tumor fungating from the right ureteral orifice extending into the distal ureter also consistent with high-grade invasive urothelial carcinoma. Muscle invasion could not be assessed. He also underwent a CT chest which showed a rounded nodule in the right upper lobe measuring 14 mm concerning for metastases. 2 other 3 mm lesions were also noted in the left upper lobe likely  benign  Plan initially was neoadjuvant chemotherapy followed by possible surgery but given the presence of lung lesion patient has been referred to oncology for the same.  PET/CT on 09/21/17 showed:IMPRESSION: 1. Hypermetabolic right obturator lymph node is most indicative of metastatic disease. 2. Mildly hypermetabolic left hilar lymph nodes are nonspecific. Continued attention on follow-up exams is warranted. 3. Right upper lobe pulmonary nodule shows metabolism after just above blood pool and is therefore indeterminate. Continued attention on follow-up exams is warranted. 4. Aortic atherosclerosis (ICD10-170.0). Coronary artery calcification. 5. Cholelithiasis  Carboplatin/ gemzar1 week on and one-week off as patient could not tolerate 2-week on and one week off regimen. Cycle 1 started on 09/22/2017 Disease progression in March 2020. Switched to second [employer]  Patient had disease controlled with Keytruda. However he was noted to have worsening dermatitis with constant flareups requiring steroids. Beryle Flock is therefore being kept on hold and patient will be switched to third line Padcev.Treatment has been on hold since February 2021 due to constant flareup of skin rash  Interval history-continues to have on and off flares of his skin rash especially over his back and abdominal wall.  He continues to be on weekly methotrexate and has not required any additional oral steroids but continues to use topical steroids  ECOG PS- 1 Pain scale- 0  Review of systems- Review of Systems  Constitutional: Negative for chills, fever, malaise/fatigue and weight loss.  HENT: Negative for congestion, ear discharge and nosebleeds.   Eyes: Negative for blurred vision.  Respiratory: Negative for cough, hemoptysis, sputum production, shortness of breath and wheezing.   Cardiovascular: Negative for chest pain, palpitations, orthopnea and claudication.  Gastrointestinal: Negative for  abdominal pain, blood in stool, constipation, diarrhea, heartburn, melena, nausea and vomiting.  Genitourinary:  Negative for dysuria, flank pain, frequency, hematuria and urgency.  Musculoskeletal: Negative for back pain, joint pain and myalgias.  Skin: Positive for rash.  Neurological: Negative for dizziness, tingling, focal weakness, seizures, weakness and headaches.  Endo/Heme/Allergies: Does not bruise/bleed easily.  Psychiatric/Behavioral: Negative for depression and suicidal ideas. The patient does not have insomnia.      Allergies  Allergen Reactions  . Demerol [Meperidine] Nausea And Vomiting  . Lipitor [Atorvastatin] Swelling  . Sulfa Antibiotics Nausea And Vomiting and Rash     Past Medical History:  Diagnosis Date  . Arthritis   . Benign fibroma of prostate 08/23/2013  . BPH (benign prostatic hyperplasia)   . Calculus of kidney 08/23/2013  . Glaucoma    no drops in 3 mo pressure good, pt denies glaucoma, eye pressure has been measuring alright.  . History of kidney stones   . HLD (hyperlipidemia)   . HOH (hard of hearing)    Left Hearing Aid  . HTN (hypertension) 12/26/2014  . Hypertension   . Hyponatremia 12/26/2014  . Migraines    history of migraines when he was younger.  [name] Restless leg syndrome   . Sinus drainage   . Skin cancer   . Skin cancer    left hand 06/2019  . Urothelial cancer ([place])    chemo tx's.  [name] UTI (lower urinary tract infection) 12/26/2014  . Vertigo      Past Surgical History:  Procedure Laterality Date  . COLONOSCOPY    . CYSTOSCOPY W/ RETROGRADES Right 01/30/2015   Procedure: CYSTOSCOPY WITH RETROGRADE PYELOGRAM;  Surgeon: [name], MD;  Location: [facility];  Service: Urology;  Laterality: Right;  . CYSTOSCOPY W/ RETROGRADES Bilateral 02/26/2016   Procedure: CYSTOSCOPY WITH RETROGRADE PYELOGRAM;  Surgeon: [name], MD;  Location: [facility];  Service: Urology;  Laterality: Bilateral;  . CYSTOSCOPY W/ URETERAL STENT PLACEMENT  Right 08/20/2015   Procedure: CYSTOSCOPY WITH RETROGRADE PYELOGRAM/POSSIBLE URETERAL STENT PLACEMENT/BLADDER BIOPSY;  Surgeon: [name], MD;  Location: [facility];  Service: Urology;  Laterality: Right;  . CYSTOSCOPY W/ URETERAL STENT PLACEMENT Right 09/12/2015   Procedure: CYSTOSCOPY WITH STENT REPLACEMENT;  Surgeon: [name], MD;  Location: [facility];  Service: Urology;  Laterality: Right;  . CYSTOSCOPY WITH BIOPSY Right 09/12/2015   Procedure: CYSTOSCOPY WITH BLADDER AND URETERAL BIOPSY;  Surgeon: [name], MD;  Location: [facility];  Service: Urology;  Laterality: Right;  . CYSTOSCOPY WITH STENT PLACEMENT Right 01/30/2015   Procedure: CYSTOSCOPY WITH STENT PLACEMENT;  Surgeon: [name], MD;  Location: [facility];  Service: Urology;  Laterality: Right;  . CYSTOSCOPY WITH STENT PLACEMENT Right 12/28/2017   Procedure: [place] WITH STENT Exchange;  Surgeon: [name], MD;  Location: [facility];  Service: Urology;  Laterality: Right;  . CYSTOSCOPY/URETEROSCOPY/HOLMIUM LASER/STENT PLACEMENT Right 08/19/2017   Procedure: CYSTOSCOPY/URETEROSCOPY/HOLMIUM LASER/STENT PLACEMENT;  Surgeon: [name], MD;  Location: [facility];  Service: Urology;  Laterality: Right;  . EYE SURGERY Bilateral    Cataract Extraction with IOL  . goiter removal    . HOLMIUM LASER APPLICATION N/A 4/[id number]/4967   Procedure:  HOLMIUM LASER APPLICATION;  Surgeon: [name], MD;  Location: [facility];  Service: Urology;  Laterality: N/A;  . PORTA CATH INSERTION N/A 09/23/2017   Procedure: PORTA CATH INSERTION;  Surgeon: Algernon Huxley, MD;  Location: [facility];  Service: Cardiovascular;  Laterality: N/A;  . SPERMATOCELECTOMY    . TONSILLECTOMY    . TRANSURETHRAL RESECTION OF BLADDER TUMOR N/A 02/26/2016   Procedure:  TRANSURETHRAL RESECTION OF BLADDER TUMOR (TURBT);  Surgeon: [name], MD;  Location: [facility];  Service: Urology;  Laterality: N/A;  . TRANSURETHRAL RESECTION OF BLADDER TUMOR WITH  MITOMYCIN-C N/A 09/12/2015   Procedure: TRANSURETHRAL RESECTION OF BLADDER TUMOR ;  Surgeon: [name], MD;  Location: [facility];  Service: Urology;  Laterality: N/A;  . TRANSURETHRAL RESECTION OF BLADDER TUMOR WITH MITOMYCIN-C N/A 03/24/2016   Procedure: TRANSURETHRAL RESECTION OF BLADDER TUMOR WITH MITOMYCIN-C  (SMALL);  Surgeon: [name], MD;  Location: [facility];  Service: Urology;  Laterality: N/A;  . URETERAL BIOPSY Right 08/19/2017   Procedure: Renal Mass BIOPSY;  Surgeon: [name], MD;  Location: [facility];  Service: Urology;  Laterality: Right;  . URETEROSCOPY Right 01/30/2015   Procedure: URETEROSCOPY/ WITH BIOPSY AND CYTOLOGY BRUSHING;  Surgeon: [name], MD;  Location: [facility];  Service: Urology;  Laterality: Right;  . URETEROSCOPY Right 08/20/2015   Procedure: URETEROSCOPY;  Surgeon: [name], MD;  Location: [facility];  Service: Urology;  Laterality: Right;  . URETEROSCOPY Right 09/12/2015   Procedure: URETEROSCOPY;  Surgeon: [name], MD;  Location: [facility];  Service: Urology;  Laterality: Right;  . URETEROSCOPY Right 02/26/2016   Procedure: URETEROSCOPY;  Surgeon: [name], MD;  Location: [facility];  Service: Urology;  Laterality: Right;    Social History   Socioeconomic History  . Marital status: Married    Spouse name: Not on file  . Number of children: Not on file  . Years of education: Not on file  . Highest education level: Not on file  Occupational History  . Not on file  Tobacco Use  . Smoking status: Never Smoker  . Smokeless tobacco: Never Used  Substance and Sexual Activity  . Alcohol use: No    Alcohol/week: 0.0 standard drinks  . Drug use: No  . Sexual activity: Not on file  Other Topics Concern  . Not on file  Social History Narrative  . Not on file   Social Determinants of Health   Financial Resource Strain:   . Difficulty of Paying Living Expenses:   Food Insecurity:   . Worried About Charity fundraiser in the  Last Year:   . Arboriculturist in the Last Year:   Transportation Needs:   . Film/video editor (Medical):   [name] Lack of Transportation (Non-Medical):   Physical Activity:   . Days of Exercise per Week:   . Minutes of Exercise per Session:   Stress:   . Feeling of Stress :   Social Connections:   . Frequency of Communication with Friends and Family:   . Frequency of Social Gatherings with Friends and Family:   . Attends Religious Services:   . Active Member of Clubs or Organizations:   . Attends Archivist Meetings:   [name] Marital Status:   Intimate Partner Violence:   . Fear of Current or Ex-Partner:   . Emotionally Abused:   [name] Physically Abused:   . Sexually Abused:     Family History  Problem Relation Age of Onset  . Hypertension Mother   . Hypertension Father   . Prostate cancer Brother   . Kidney disease Neg Hx   . Kidney cancer Neg Hx   . Bladder Cancer Neg Hx      Current Outpatient Medications:  .  acetaminophen (TYLENOL) 500 MG tablet, Take 1,000 mg by mouth daily as needed for moderate pain. , Disp: , Rfl:  .  amLODipine (NORVASC)  5 MG tablet, Take 5 mg by mouth daily. , Disp: , Rfl:  .  benazepril-hydrochlorthiazide (LOTENSIN HCT) 20-12.5 MG tablet, Take 1 tablet by mouth daily. , Disp: , Rfl:  .  camphor-menthol (SARNA) lotion, Apply 1 application topically as needed for itching., Disp: , Rfl:  .  diazepam (VALIUM) 5 MG tablet, TAKE 1 TABLET (5 MG TOTAL) BY MOUTH EVERY 12 (TWELVE) HOURS AS NEEDED FOR ANXIETY OR SLEEP, Disp: , Rfl:  .  docusate sodium (COLACE) 100 MG capsule, Take 100 mg by mouth 2 (two) times daily. , Disp: , Rfl:  .  fenofibrate micronized (LOFIBRA) 134 MG capsule, Take 134 mg by mouth daily before breakfast. , Disp: , Rfl:  .  folic acid (FOLVITE) 1 MG tablet, Take 1 mg by mouth as directed. Take it daily on Friday to wed only each week, Disp: , Rfl:  .  hydrOXYzine (ATARAX/VISTARIL) 25 MG tablet, TAKE 1 TABLET BY MOUTH THREE TIMES A  DAY AS NEEDED, Disp: 270 tablet, Rfl: 1 .  methotrexate 2.5 MG tablet, Take 10 mg by mouth once a week. Caution:Chemotherapy. Protect from light., Disp: , Rfl:  .  Multiple Vitamin (MULTIVITAMIN WITH MINERALS) TABS tablet, Take 1 tablet by mouth daily., Disp: , Rfl:  .  polyethylene glycol (MIRALAX / GLYCOLAX) 17 g packet, Take 17 g by mouth daily as needed. , Disp: , Rfl:  .  Potassium 99 MG TABS, Take 99 mg by mouth at bedtime., Disp: , Rfl:  .  psyllium (METAMUCIL) 58.6 % packet, Take 1 packet by mouth daily. , Disp: , Rfl:  .  tamsulosin (FLOMAX) 0.4 MG CAPS capsule, Take 1 capsule (0.4 mg total) by mouth daily., Disp: 90 capsule, Rfl: 3 .  triamcinolone cream (KENALOG) 0.1 %, Apply 1 application topically 2 (two) times daily., Disp: , Rfl:  .  HYDROcodone-acetaminophen (NORCO/VICODIN) 5-325 MG tablet, Take 1 tablet by mouth every 6 (six) hours as needed for severe pain. (Patient not taking: Reported on 07/05/2019), Disp: 30 tablet, Rfl: 0 No current facility-administered medications for this visit.  Facility-Administered Medications Ordered in Other Visits:  .  sodium chloride flush (NS) 0.9 % injection 10 mL, 10 mL, Intravenous, Once, Sindy Guadeloupe, MD .  sodium chloride flush (NS) 0.9 % injection 10 mL, 10 mL, Intravenous, PRN, Sindy Guadeloupe, MD  Physical exam:  Vitals:   08/15/19 1025  BP: 114/60  Pulse: 69  Resp: 16  Temp: 98.3 F (36.8 C)  TempSrc: Oral  Weight: 197 lb 14.4 oz (89.8 kg)   Physical Exam Constitutional:      General: He is not in acute distress. Cardiovascular:     Rate and Rhythm: Normal rate and regular rhythm.     Heart sounds: Normal heart sounds.  Pulmonary:     Effort: Pulmonary effort is normal.     Breath sounds: Normal breath sounds.  Abdominal:     General: Bowel sounds are normal.     Palpations: Abdomen is soft.  Skin:    Comments: Clusters of maculopapular erythematous skin rash noted over anterior abdominal wall over a span of 4 cm radius  as well as lower back  Neurological:     Mental Status: He is alert and oriented to person, place, and time.      CMP Latest Ref Rng & Units 08/15/2019  Glucose 70 - 99 mg/dL 116(H)  BUN 8 - 23 mg/dL 19  Creatinine 0.61 - 1.24 mg/dL 1.17  Sodium 135 - 145 mmol/L  132(L)  Potassium 3.5 - 5.1 mmol/L 4.2  Chloride 98 - 111 mmol/L 96(L)  CO2 22 - 32 mmol/L 29  Calcium 8.9 - 10.3 mg/dL 8.8(L)  Total Protein 6.5 - 8.1 g/dL -  Total Bilirubin 0.3 - 1.2 mg/dL -  Alkaline Phos 38 - 126 U/L -  AST 15 - 41 U/L -  ALT 0 - 44 U/L -   CBC Latest Ref Rng & Units 08/15/2019  WBC 4.0 - 10.5 K/uL 5.1  Hemoglobin 13.0 - 17.0 g/dL 13.0  Hematocrit 39.0 - 52.0 % 37.2(L)  Platelets 150 - 400 K/uL 294      Assessment and plan- Patient is a 86 y.o. male withmetastatic urothelial carcinoma with metastases to the lymph nodes.   Is a routine follow-up visit for ongoing skin rash  Skin rash: Patient has not received any Padcev since February 2021.  Although his extent of the rash is relatively better he continues to get on and off flareups.  He also follows up with Dr. Evorn Gong from dermatology and has also undergone biopsy of the skin rash which was nonspecific.  He is currently on weekly methotrexate as a steroid sparing agent for his rash and is also using topical steroids which she will continue.  Metastatic urothelial carcinoma:Recent scans from May 2021 did not show any evidence of progression in all treatment is currently on hold due to his ongoing skin rash.  If he has evidence of radiological progression we will have to explore other treatment options at that time  AKI: He received IV fluids during his last visit and has also been drinkingPlenty of oral fluids.  Serum creatinine is now back to normal.  Continue to monitor   Visit Diagnosis 1. Drug-induced skin rash   2. Encounter for monitoring of methotrexate therapy   3. 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 Randa Evens, MD,  [facility] [phone] 08/17/2019 9:20 AM

## 2019-09-14 ENCOUNTER — Other Ambulatory Visit: Payer: Self-pay

## 2019-09-14 ENCOUNTER — Inpatient Hospital Stay: Payer: PPO | Attending: Oncology

## 2019-09-14 DIAGNOSIS — C669 Malignant neoplasm of unspecified ureter: Secondary | ICD-10-CM | POA: Diagnosis not present

## 2019-09-14 DIAGNOSIS — Z95828 Presence of other vascular implants and grafts: Secondary | ICD-10-CM

## 2019-09-14 LAB — CBC WITH DIFFERENTIAL/PLATELET
Abs Immature Granulocytes: 0.02 10*3/uL (ref 0.00–0.07)
Basophils Absolute: 0.1 10*3/uL (ref 0.0–0.1)
Basophils Relative: 1 %
Eosinophils Absolute: 0.4 10*3/uL (ref 0.0–0.5)
Eosinophils Relative: 7 %
HCT: 35.5 % — ABNORMAL LOW (ref 39.0–52.0)
Hemoglobin: 12.6 g/dL — ABNORMAL LOW (ref 13.0–17.0)
Immature Granulocytes: 0 %
Lymphocytes Relative: 27 %
Lymphs Abs: 1.7 10*3/uL (ref 0.7–4.0)
MCH: 31.1 pg (ref 26.0–34.0)
MCHC: 35.5 g/dL (ref 30.0–36.0)
MCV: 87.7 fL (ref 80.0–100.0)
Monocytes Absolute: 0.6 10*3/uL (ref 0.1–1.0)
Monocytes Relative: 10 %
Neutro Abs: 3.4 10*3/uL (ref 1.7–7.7)
Neutrophils Relative %: 55 %
Platelets: 245 10*3/uL (ref 150–400)
RBC: 4.05 MIL/uL — ABNORMAL LOW (ref 4.22–5.81)
RDW: 13.7 % (ref 11.5–15.5)
WBC: 6.2 10*3/uL (ref 4.0–10.5)
nRBC: 0 % (ref 0.0–0.2)

## 2019-09-14 LAB — COMPREHENSIVE METABOLIC PANEL
ALT: 21 U/L (ref 0–44)
AST: 31 U/L (ref 15–41)
Albumin: 3.6 g/dL (ref 3.5–5.0)
Alkaline Phosphatase: 54 U/L (ref 38–126)
Anion gap: 7 (ref 5–15)
BUN: 20 mg/dL (ref 8–23)
CO2: 27 mmol/L (ref 22–32)
Calcium: 8.4 mg/dL — ABNORMAL LOW (ref 8.9–10.3)
Chloride: 96 mmol/L — ABNORMAL LOW (ref 98–111)
Creatinine, Ser: 1.11 mg/dL (ref 0.61–1.24)
GFR calc Af Amer: >60 mL/min (ref >60)
GFR calc non Af Amer: 60 mL/min — ABNORMAL LOW (ref >60)
Glucose, Bld: 141 mg/dL — ABNORMAL HIGH (ref 70–99)
Potassium: 4 mmol/L (ref 3.5–5.1)
Sodium: 130 mmol/L — ABNORMAL LOW (ref 135–145)
Total Bilirubin: 0.6 mg/dL (ref 0.3–1.2)
Total Protein: 6.2 g/dL — ABNORMAL LOW (ref 6.5–8.1)

## 2019-09-14 MED ORDER — HEPARIN SOD (PORK) LOCK FLUSH 100 UNIT/ML IV SOLN
500.0000 [IU] | Freq: Once | INTRAVENOUS | Status: AC
  Administered 2019-09-14: 500 [IU] via INTRAVENOUS
  Filled 2019-09-14: qty 5

## 2019-09-14 MED ORDER — SODIUM CHLORIDE 0.9% FLUSH
10.0000 mL | Freq: Once | INTRAVENOUS | Status: AC
  Administered 2019-09-14: 10 mL via INTRAVENOUS
  Filled 2019-09-14: qty 10

## 2019-10-27 ENCOUNTER — Inpatient Hospital Stay (HOSPITAL_BASED_OUTPATIENT_CLINIC_OR_DEPARTMENT_OTHER): Payer: PPO | Admitting: Oncology

## 2019-10-27 ENCOUNTER — Other Ambulatory Visit: Payer: Self-pay

## 2019-10-27 ENCOUNTER — Inpatient Hospital Stay: Payer: PPO | Attending: Oncology

## 2019-10-27 VITALS — BP 150/78 | HR 75 | Temp 98.5°F | Wt 201.6 lb

## 2019-10-27 DIAGNOSIS — Z87440 Personal history of urinary (tract) infections: Secondary | ICD-10-CM | POA: Insufficient documentation

## 2019-10-27 DIAGNOSIS — Z85828 Personal history of other malignant neoplasm of skin: Secondary | ICD-10-CM | POA: Insufficient documentation

## 2019-10-27 DIAGNOSIS — C669 Malignant neoplasm of unspecified ureter: Secondary | ICD-10-CM | POA: Diagnosis not present

## 2019-10-27 DIAGNOSIS — Z8042 Family history of malignant neoplasm of prostate: Secondary | ICD-10-CM | POA: Diagnosis not present

## 2019-10-27 DIAGNOSIS — Z882 Allergy status to sulfonamides status: Secondary | ICD-10-CM | POA: Diagnosis not present

## 2019-10-27 DIAGNOSIS — R21 Rash and other nonspecific skin eruption: Secondary | ICD-10-CM

## 2019-10-27 DIAGNOSIS — M1990 Unspecified osteoarthritis, unspecified site: Secondary | ICD-10-CM | POA: Insufficient documentation

## 2019-10-27 DIAGNOSIS — I700 Atherosclerosis of aorta: Secondary | ICD-10-CM | POA: Diagnosis not present

## 2019-10-27 DIAGNOSIS — Z87442 Personal history of urinary calculi: Secondary | ICD-10-CM | POA: Insufficient documentation

## 2019-10-27 DIAGNOSIS — C679 Malignant neoplasm of bladder, unspecified: Secondary | ICD-10-CM | POA: Insufficient documentation

## 2019-10-27 DIAGNOSIS — R5383 Other fatigue: Secondary | ICD-10-CM | POA: Insufficient documentation

## 2019-10-27 DIAGNOSIS — K8020 Calculus of gallbladder without cholecystitis without obstruction: Secondary | ICD-10-CM | POA: Diagnosis not present

## 2019-10-27 DIAGNOSIS — N400 Enlarged prostate without lower urinary tract symptoms: Secondary | ICD-10-CM | POA: Diagnosis not present

## 2019-10-27 DIAGNOSIS — L309 Dermatitis, unspecified: Secondary | ICD-10-CM | POA: Diagnosis not present

## 2019-10-27 DIAGNOSIS — Z8249 Family history of ischemic heart disease and other diseases of the circulatory system: Secondary | ICD-10-CM | POA: Insufficient documentation

## 2019-10-27 DIAGNOSIS — Z885 Allergy status to narcotic agent status: Secondary | ICD-10-CM | POA: Diagnosis not present

## 2019-10-27 DIAGNOSIS — C775 Secondary and unspecified malignant neoplasm of intrapelvic lymph nodes: Secondary | ICD-10-CM | POA: Insufficient documentation

## 2019-10-27 DIAGNOSIS — R911 Solitary pulmonary nodule: Secondary | ICD-10-CM | POA: Insufficient documentation

## 2019-10-27 DIAGNOSIS — Z79899 Other long term (current) drug therapy: Secondary | ICD-10-CM | POA: Insufficient documentation

## 2019-10-27 DIAGNOSIS — I2510 Atherosclerotic heart disease of native coronary artery without angina pectoris: Secondary | ICD-10-CM | POA: Diagnosis not present

## 2019-10-27 LAB — COMPREHENSIVE METABOLIC PANEL
ALT: 21 U/L (ref 0–44)
AST: 31 U/L (ref 15–41)
Albumin: 3.7 g/dL (ref 3.5–5.0)
Alkaline Phosphatase: 50 U/L (ref 38–126)
Anion gap: 7 (ref 5–15)
BUN: 20 mg/dL (ref 8–23)
CO2: 27 mmol/L (ref 22–32)
Calcium: 8.6 mg/dL — ABNORMAL LOW (ref 8.9–10.3)
Chloride: 100 mmol/L (ref 98–111)
Creatinine, Ser: 1.06 mg/dL (ref 0.61–1.24)
GFR calc Af Amer: >60 mL/min (ref >60)
GFR calc non Af Amer: >60 mL/min (ref >60)
Glucose, Bld: 107 mg/dL — ABNORMAL HIGH (ref 70–99)
Potassium: 4.1 mmol/L (ref 3.5–5.1)
Sodium: 134 mmol/L — ABNORMAL LOW (ref 135–145)
Total Bilirubin: 0.7 mg/dL (ref 0.3–1.2)
Total Protein: 6.5 g/dL (ref 6.5–8.1)

## 2019-10-27 LAB — CBC WITH DIFFERENTIAL/PLATELET
Abs Immature Granulocytes: 0.03 10*3/uL (ref 0.00–0.07)
Basophils Absolute: 0 10*3/uL (ref 0.0–0.1)
Basophils Relative: 1 %
Eosinophils Absolute: 0.3 10*3/uL (ref 0.0–0.5)
Eosinophils Relative: 5 %
HCT: 37.4 % — ABNORMAL LOW (ref 39.0–52.0)
Hemoglobin: 13.3 g/dL (ref 13.0–17.0)
Immature Granulocytes: 1 %
Lymphocytes Relative: 27 %
Lymphs Abs: 1.5 10*3/uL (ref 0.7–4.0)
MCH: 31.2 pg (ref 26.0–34.0)
MCHC: 35.6 g/dL (ref 30.0–36.0)
MCV: 87.8 fL (ref 80.0–100.0)
Monocytes Absolute: 0.5 10*3/uL (ref 0.1–1.0)
Monocytes Relative: 10 %
Neutro Abs: 3.1 10*3/uL (ref 1.7–7.7)
Neutrophils Relative %: 56 %
Platelets: 259 10*3/uL (ref 150–400)
RBC: 4.26 MIL/uL (ref 4.22–5.81)
RDW: 14.1 % (ref 11.5–15.5)
WBC: 5.5 10*3/uL (ref 4.0–10.5)
nRBC: 0 % (ref 0.0–0.2)

## 2019-10-27 MED ORDER — HEPARIN SOD (PORK) LOCK FLUSH 100 UNIT/ML IV SOLN
INTRAVENOUS | Status: AC
  Filled 2019-10-27: qty 5

## 2019-10-27 MED ORDER — HEPARIN SOD (PORK) LOCK FLUSH 100 UNIT/ML IV SOLN
500.0000 [IU] | Freq: Once | INTRAVENOUS | Status: AC
  Administered 2019-10-27: 500 [IU] via INTRAVENOUS
  Filled 2019-10-27: qty 5

## 2019-10-27 MED ORDER — SODIUM CHLORIDE 0.9% FLUSH
10.0000 mL | Freq: Once | INTRAVENOUS | Status: AC
  Administered 2019-10-27: 10 mL via INTRAVENOUS
  Filled 2019-10-27: qty 10

## 2019-10-27 NOTE — Progress Notes (Signed)
Hematology/Oncology Consult note [facility]  [phone] [phone]  Patient Care Team: Idelle Crouch, MD as PCP - General (Internal Medicine) Sindy Guadeloupe, MD as Consulting Physician (Hematology and Oncology) Sindy Guadeloupe, MD as Consulting Physician (Hematology and Oncology) Sindy Guadeloupe, MD as Consulting Physician (Hematology and Oncology)   Name of the patient: Anthony Gonzales  [id number]  09-08-1931   Date of visit: 10/27/19  Diagnosis- Metastatic urothelial carcinoma with possible mets to the obturator node. Indeterminate hilar lymph nodes and LUL lung nodule  Chief complaint/ Reason for visit-routine follow-up of skin rash and bladder cancer  Heme/Onc history: patient is a 86 year old male with past medical history significant for hypertension and long-standing history of superficial bladder cancer for which he sees [name]. He has undergone TURBT as well as ureteroscopy since 2017 along with mitomycin as well in the past. Most recently he underwent CT abdomen on 08/06/2017 which showed a soft tissue mass in the right renal pelvis concerning for upper tract urothelial neoplasm. Soft tissue fullness at the right ureterovesical junction with proximal right hydroureteronephrosis. Several millimeter attenuation lesion in the pancreatic head.  He underwent diagnostic ureteroscopy and was found to have 2 high-grade lesions within his right kidney. There was a nodular high-grade appearing lesion in the right anterior renal pelvis. There was also a second ureteral tumor fungating from the right ureteral orifice extending into the distal ureter also consistent with high-grade invasive urothelial carcinoma. Muscle invasion could not be assessed. He also underwent a CT chest which showed a rounded nodule in the right upper lobe measuring 14 mm concerning for metastases. 2 other 3 mm lesions were also noted in the left upper  lobe likely benign  Plan initially was neoadjuvant chemotherapy followed by possible surgery but given the presence of lung lesion patient has been referred to oncology for the same.  PET/CT on 09/21/17 showed:IMPRESSION: 1. Hypermetabolic right obturator lymph node is most indicative of metastatic disease. 2. Mildly hypermetabolic left hilar lymph nodes are nonspecific. Continued attention on follow-up exams is warranted. 3. Right upper lobe pulmonary nodule shows metabolism after just above blood pool and is therefore indeterminate. Continued attention on follow-up exams is warranted. 4. Aortic atherosclerosis (ICD10-170.0). Coronary artery calcification. 5. Cholelithiasis  Carboplatin/ gemzar1 week on and one-week off as patient could not tolerate 2-week on and one week off regimen. Cycle 1 started on 09/22/2017 Disease progression in March 2020. Switched to second [employer]  Patient had disease controlled with Keytruda. However he was noted to have worsening dermatitis with constant flareups requiring steroids. Beryle Flock is therefore being kept on hold and patient will be switched to third line Padcev.Treatment has been on hold since February 2021 due to constant flareup of skin rash  Interval history-patient has been on weekly methotrexate and reports that his rash has remained stable over the last 2 months.  No recent flareups.  He has occasional erythematous nodules over his anterior abdominal wall and back which come and go but do not itch as much and has not been getting worse.  Appetite has been doing good and he has not had any unintentional weight loss.  Has mild chronic fatigue  ECOG PS- 1 Pain scale- 0   Review of systems- Review of Systems  Constitutional: Positive for malaise/fatigue. Negative for chills, fever and weight loss.  HENT: Negative for congestion, ear discharge and nosebleeds.   Eyes: Negative for blurred vision.  Respiratory: Negative for  cough,  hemoptysis, sputum production, shortness of breath and wheezing.   Cardiovascular: Negative for chest pain, palpitations, orthopnea and claudication.  Gastrointestinal: Negative for abdominal pain, blood in stool, constipation, diarrhea, heartburn, melena, nausea and vomiting.  Genitourinary: Negative for dysuria, flank pain, frequency, hematuria and urgency.  Musculoskeletal: Negative for back pain, joint pain and myalgias.  Skin: Positive for rash.  Neurological: Negative for dizziness, tingling, focal weakness, seizures, weakness and headaches.  Endo/Heme/Allergies: Does not bruise/bleed easily.  Psychiatric/Behavioral: Negative for depression and suicidal ideas. The patient does not have insomnia.       Allergies  Allergen Reactions  . Demerol [Meperidine] Nausea And Vomiting  . Lipitor [Atorvastatin] Swelling  . Sulfa Antibiotics Nausea And Vomiting and Rash     Past Medical History:  Diagnosis Date  . Arthritis   . Benign fibroma of prostate 08/23/2013  . BPH (benign prostatic hyperplasia)   . Calculus of kidney 08/23/2013  . Glaucoma    no drops in 3 mo pressure good, pt denies glaucoma, eye pressure has been measuring alright.  . History of kidney stones   . HLD (hyperlipidemia)   . HOH (hard of hearing)    Left Hearing Aid  . HTN (hypertension) 12/26/2014  . Hypertension   . Hyponatremia 12/26/2014  . Migraines    history of migraines when he was younger.  [name] Restless leg syndrome   . Sinus drainage   . Skin cancer   . Skin cancer    left hand 06/2019  . Urothelial cancer ([place])    chemo tx's.  [name] UTI (lower urinary tract infection) 12/26/2014  . Vertigo      Past Surgical History:  Procedure Laterality Date  . COLONOSCOPY    . CYSTOSCOPY W/ RETROGRADES Right 01/30/2015   Procedure: CYSTOSCOPY WITH RETROGRADE PYELOGRAM;  Surgeon: [name], MD;  Location: [facility];  Service: Urology;  Laterality: Right;  . CYSTOSCOPY W/ RETROGRADES Bilateral  02/26/2016   Procedure: CYSTOSCOPY WITH RETROGRADE PYELOGRAM;  Surgeon: [name], MD;  Location: [facility];  Service: Urology;  Laterality: Bilateral;  . CYSTOSCOPY W/ URETERAL STENT PLACEMENT Right 08/20/2015   Procedure: CYSTOSCOPY WITH RETROGRADE PYELOGRAM/POSSIBLE URETERAL STENT PLACEMENT/BLADDER BIOPSY;  Surgeon: [name], MD;  Location: [facility];  Service: Urology;  Laterality: Right;  . CYSTOSCOPY W/ URETERAL STENT PLACEMENT Right 09/12/2015   Procedure: CYSTOSCOPY WITH STENT REPLACEMENT;  Surgeon: [name], MD;  Location: [facility];  Service: Urology;  Laterality: Right;  . CYSTOSCOPY WITH BIOPSY Right 09/12/2015   Procedure: CYSTOSCOPY WITH BLADDER AND URETERAL BIOPSY;  Surgeon: [name], MD;  Location: [facility];  Service: Urology;  Laterality: Right;  . CYSTOSCOPY WITH STENT PLACEMENT Right 01/30/2015   Procedure: CYSTOSCOPY WITH STENT PLACEMENT;  Surgeon: [name], MD;  Location: [facility];  Service: Urology;  Laterality: Right;  . CYSTOSCOPY WITH STENT PLACEMENT Right 12/28/2017   Procedure: Gu-Win WITH STENT Exchange;  Surgeon: [name], MD;  Location: [facility];  Service: Urology;  Laterality: Right;  . CYSTOSCOPY/URETEROSCOPY/HOLMIUM LASER/STENT PLACEMENT Right 08/19/2017   Procedure: CYSTOSCOPY/URETEROSCOPY/HOLMIUM LASER/STENT PLACEMENT;  Surgeon: [name], MD;  Location: [facility];  Service: Urology;  Laterality: Right;  . EYE SURGERY Bilateral    Cataract Extraction with IOL  . goiter removal    . HOLMIUM LASER APPLICATION N/A 3/38/2505   Procedure:  HOLMIUM LASER APPLICATION;  Surgeon: [name], MD;  Location: [facility];  Service: Urology;  Laterality: N/A;  . PORTA CATH INSERTION N/A 09/23/2017   Procedure: PORTA CATH INSERTION;  Surgeon:  Algernon Huxley, MD;  Location: [facility];  Service: Cardiovascular;  Laterality: N/A;  . SPERMATOCELECTOMY    . TONSILLECTOMY    . TRANSURETHRAL RESECTION OF BLADDER TUMOR N/A 02/26/2016    Procedure: TRANSURETHRAL RESECTION OF BLADDER TUMOR (TURBT);  Surgeon: [name], MD;  Location: [facility];  Service: Urology;  Laterality: N/A;  . TRANSURETHRAL RESECTION OF BLADDER TUMOR WITH MITOMYCIN-C N/A 09/12/2015   Procedure: TRANSURETHRAL RESECTION OF BLADDER TUMOR ;  Surgeon: [name], MD;  Location: [facility];  Service: Urology;  Laterality: N/A;  . TRANSURETHRAL RESECTION OF BLADDER TUMOR WITH MITOMYCIN-C N/A 03/24/2016   Procedure: TRANSURETHRAL RESECTION OF BLADDER TUMOR WITH MITOMYCIN-C  (SMALL);  Surgeon: [name], MD;  Location: [facility];  Service: Urology;  Laterality: N/A;  . URETERAL BIOPSY Right 08/19/2017   Procedure: Renal Mass BIOPSY;  Surgeon: [name], MD;  Location: [facility];  Service: Urology;  Laterality: Right;  . URETEROSCOPY Right 01/30/2015   Procedure: URETEROSCOPY/ WITH BIOPSY AND CYTOLOGY BRUSHING;  Surgeon: [name], MD;  Location: [facility];  Service: Urology;  Laterality: Right;  . URETEROSCOPY Right 08/20/2015   Procedure: URETEROSCOPY;  Surgeon: [name], MD;  Location: [facility];  Service: Urology;  Laterality: Right;  . URETEROSCOPY Right 09/12/2015   Procedure: URETEROSCOPY;  Surgeon: [name], MD;  Location: [facility];  Service: Urology;  Laterality: Right;  . URETEROSCOPY Right 02/26/2016   Procedure: URETEROSCOPY;  Surgeon: [name], MD;  Location: [facility];  Service: Urology;  Laterality: Right;    Social History   Socioeconomic History  . Marital status: Married    Spouse name: Not on file  . Number of children: Not on file  . Years of education: Not on file  . Highest education level: Not on file  Occupational History  . Not on file  Tobacco Use  . Smoking status: Never Smoker  . Smokeless tobacco: Never Used  Vaping Use  . Vaping Use: Never used  Substance and Sexual Activity  . Alcohol use: No    Alcohol/week: 0.0 standard drinks  . Drug use: No  . Sexual activity: Not on file  Other Topics  Concern  . Not on file  Social History Narrative  . Not on file   Social Determinants of Health   Financial Resource Strain:   . Difficulty of Paying Living Expenses: Not on file  Food Insecurity:   . Worried About Charity fundraiser in the Last Year: Not on file  . Ran Out of Food in the Last Year: Not on file  Transportation Needs:   . Lack of Transportation (Medical): Not on file  . Lack of Transportation (Non-Medical): Not on file  Physical Activity:   . Days of Exercise per Week: Not on file  . Minutes of Exercise per Session: Not on file  Stress:   . Feeling of Stress : Not on file  Social Connections:   . Frequency of Communication with Friends and Family: Not on file  . Frequency of Social Gatherings with Friends and Family: Not on file  . Attends Religious Services: Not on file  . Active Member of Clubs or Organizations: Not on file  . Attends Archivist Meetings: Not on file  . Marital Status: Not on file  Intimate Partner Violence:   . Fear of Current or Ex-Partner: Not on file  . Emotionally Abused: Not on file  . Physically Abused: Not on file  . Sexually Abused: Not on file  Family History  Problem Relation Age of Onset  . Hypertension Mother   . Hypertension Father   . Prostate cancer Brother   . Kidney disease Neg Hx   . Kidney cancer Neg Hx   . Bladder Cancer Neg Hx      Current Outpatient Medications:  .  acetaminophen (TYLENOL) 500 MG tablet, Take 1,000 mg by mouth daily as needed for moderate pain. , Disp: , Rfl:  .  benazepril-hydrochlorthiazide (LOTENSIN HCT) 20-12.5 MG tablet, Take 1 tablet by mouth daily. , Disp: , Rfl:  .  camphor-menthol (SARNA) lotion, Apply 1 application topically as needed for itching., Disp: , Rfl:  .  diazepam (VALIUM) 5 MG tablet, TAKE 1 TABLET (5 MG TOTAL) BY MOUTH EVERY 12 (TWELVE) HOURS AS NEEDED FOR ANXIETY OR SLEEP, Disp: , Rfl:  .  docusate sodium (COLACE) 100 MG capsule, Take 100 mg by mouth 2 (two)  times daily. , Disp: , Rfl:  .  fenofibrate micronized (LOFIBRA) 134 MG capsule, Take 134 mg by mouth daily before breakfast. , Disp: , Rfl:  .  folic acid (FOLVITE) 1 MG tablet, Take 1 mg by mouth as directed. Take it daily on Friday to wed only each week, Disp: , Rfl:  .  hydrOXYzine (ATARAX/VISTARIL) 25 MG tablet, TAKE 1 TABLET BY MOUTH THREE TIMES A DAY AS NEEDED, Disp: 270 tablet, Rfl: 1 .  methotrexate 2.5 MG tablet, Take 10 mg by mouth once a week. Caution:Chemotherapy. Protect from light., Disp: , Rfl:  .  Multiple Vitamin (MULTIVITAMIN WITH MINERALS) TABS tablet, Take 1 tablet by mouth daily., Disp: , Rfl:  .  polyethylene glycol (MIRALAX / GLYCOLAX) 17 g packet, Take 17 g by mouth daily as needed. , Disp: , Rfl:  .  Potassium 99 MG TABS, Take 99 mg by mouth at bedtime., Disp: , Rfl:  .  psyllium (METAMUCIL) 58.6 % packet, Take 1 packet by mouth daily. , Disp: , Rfl:  .  tamsulosin (FLOMAX) 0.4 MG CAPS capsule, Take 1 capsule (0.4 mg total) by mouth daily., Disp: 90 capsule, Rfl: 3 .  triamcinolone cream (KENALOG) 0.1 %, Apply 1 application topically 2 (two) times daily., Disp: , Rfl:  .  amLODipine (NORVASC) 5 MG tablet, Take 5 mg by mouth daily. , Disp: , Rfl:  .  HYDROcodone-acetaminophen (NORCO/VICODIN) 5-325 MG tablet, Take 1 tablet by mouth every 6 (six) hours as needed for severe pain. (Patient not taking: Reported on 07/05/2019), Disp: 30 tablet, Rfl: 0 No current facility-administered medications for this visit.  Facility-Administered Medications Ordered in Other Visits:  .  sodium chloride flush (NS) 0.9 % injection 10 mL, 10 mL, Intravenous, Once, Sindy Guadeloupe, MD .  sodium chloride flush (NS) 0.9 % injection 10 mL, 10 mL, Intravenous, PRN, Sindy Guadeloupe, MD  Physical exam:  Vitals:   10/27/19 1013  BP: (!) 150/78  Pulse: 75  Temp: 98.5 F (36.9 C)  TempSrc: Tympanic  SpO2: 96%  Weight: 201 lb 9.6 oz (91.4 kg)   Physical Exam Constitutional:      General: He is  not in acute distress. Cardiovascular:     Rate and Rhythm: Normal rate and regular rhythm.     Heart sounds: Normal heart sounds.  Pulmonary:     Effort: Pulmonary effort is normal.     Breath sounds: Normal breath sounds.  Abdominal:     General: Bowel sounds are normal.     Palpations: Abdomen is soft.  Skin:  Comments: Discrete erythematous maculopapular nodules noted over anterior abdominal wall as well as back but fewer in number as compared to before  Neurological:     Mental Status: He is alert and oriented to person, place, and time.      CMP Latest Ref Rng & Units 10/27/2019  Glucose 70 - 99 mg/dL 107(H)  BUN 8 - 23 mg/dL 20  Creatinine 0.61 - 1.24 mg/dL 1.06  Sodium 135 - 145 mmol/L 134(L)  Potassium 3.5 - 5.1 mmol/L 4.1  Chloride 98 - 111 mmol/L 100  CO2 22 - 32 mmol/L 27  Calcium 8.9 - 10.3 mg/dL 8.6(L)  Total Protein 6.5 - 8.1 g/dL 6.5  Total Bilirubin 0.3 - 1.2 mg/dL 0.7  Alkaline Phos 38 - 126 U/L 50  AST 15 - 41 U/L 31  ALT 0 - 44 U/L 21   CBC Latest Ref Rng & Units 10/27/2019  WBC 4.0 - 10.5 K/uL 5.5  Hemoglobin 13.0 - 17.0 g/dL 13.3  Hematocrit 39 - 52 % 37.4(L)  Platelets 150 - 400 K/uL 259      Assessment and plan- Patient is a 86 y.o. male withmetastatic urothelial carcinoma with metastases to the lymph nodes.   He is here for routine follow-up  1.  Skin rash: Possibly drug-induced.  He has had flareups on both Keytruda and Padcev which has been on hold.  Currently patient is on weekly methotrexate with stabilization of his rash.  Continue to monitor  2.  Metastatic urothelial carcinoma: He has not received any active treatment for this since February 2021 due to ongoing skin rash.  Last scan in May 2021 did not show any evidence of progression.  I will plan to get repeat CT chest abdomen and pelvis with contrast next month   Visit Diagnosis 1. 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 Skin rash      Dr. Randa Evens, MD, MPH [facility] at  [facility] [phone] 10/27/2019 3:50 PM

## 2019-10-31 ENCOUNTER — Telehealth: Payer: Self-pay | Admitting: Oncology

## 2019-10-31 NOTE — Telephone Encounter (Signed)
Patient phoned on this date and asked to move his lab appt to another date as he did not feel that he would be able to complete his lab, drink his contrast, and complete his scan. Lab appt moved to prior to MD appt on 11-28-19.

## 2019-11-16 DIAGNOSIS — E782 Mixed hyperlipidemia: Secondary | ICD-10-CM | POA: Diagnosis not present

## 2019-11-16 DIAGNOSIS — I10 Essential (primary) hypertension: Secondary | ICD-10-CM | POA: Diagnosis not present

## 2019-11-16 DIAGNOSIS — Z79899 Other long term (current) drug therapy: Secondary | ICD-10-CM | POA: Diagnosis not present

## 2019-11-16 DIAGNOSIS — R739 Hyperglycemia, unspecified: Secondary | ICD-10-CM | POA: Diagnosis not present

## 2019-11-23 DIAGNOSIS — C649 Malignant neoplasm of unspecified kidney, except renal pelvis: Secondary | ICD-10-CM | POA: Diagnosis not present

## 2019-11-23 DIAGNOSIS — E782 Mixed hyperlipidemia: Secondary | ICD-10-CM | POA: Diagnosis not present

## 2019-11-23 DIAGNOSIS — R739 Hyperglycemia, unspecified: Secondary | ICD-10-CM | POA: Diagnosis not present

## 2019-11-23 DIAGNOSIS — G720 Drug-induced myopathy: Secondary | ICD-10-CM | POA: Diagnosis not present

## 2019-11-23 DIAGNOSIS — T466X5A Adverse effect of antihyperlipidemic and antiarteriosclerotic drugs, initial encounter: Secondary | ICD-10-CM | POA: Diagnosis not present

## 2019-11-23 DIAGNOSIS — I10 Essential (primary) hypertension: Secondary | ICD-10-CM | POA: Diagnosis not present

## 2019-11-24 ENCOUNTER — Ambulatory Visit
Admission: RE | Admit: 2019-11-24 | Discharge: 2019-11-24 | Disposition: A | Discharge status: Home / Self Care | Payer: PPO | Source: Ambulatory Visit | Attending: Oncology | Admitting: Oncology

## 2019-11-24 ENCOUNTER — Other Ambulatory Visit: Payer: PPO

## 2019-11-24 ENCOUNTER — Other Ambulatory Visit: Payer: Self-pay

## 2019-11-24 DIAGNOSIS — R918 Other nonspecific abnormal finding of lung field: Secondary | ICD-10-CM | POA: Diagnosis not present

## 2019-11-24 DIAGNOSIS — C669 Malignant neoplasm of unspecified ureter: Secondary | ICD-10-CM

## 2019-11-24 DIAGNOSIS — I700 Atherosclerosis of aorta: Secondary | ICD-10-CM | POA: Diagnosis not present

## 2019-11-24 DIAGNOSIS — K8020 Calculus of gallbladder without cholecystitis without obstruction: Secondary | ICD-10-CM | POA: Insufficient documentation

## 2019-11-24 DIAGNOSIS — K862 Cyst of pancreas: Secondary | ICD-10-CM | POA: Diagnosis not present

## 2019-11-24 DIAGNOSIS — K8689 Other specified diseases of pancreas: Secondary | ICD-10-CM | POA: Diagnosis not present

## 2019-11-24 DIAGNOSIS — M47816 Spondylosis without myelopathy or radiculopathy, lumbar region: Secondary | ICD-10-CM | POA: Diagnosis not present

## 2019-11-24 DIAGNOSIS — I2510 Atherosclerotic heart disease of native coronary artery without angina pectoris: Secondary | ICD-10-CM | POA: Diagnosis not present

## 2019-11-24 MED ORDER — IOHEXOL 300 MG/ML  SOLN
100.0000 mL | Freq: Once | INTRAMUSCULAR | Status: AC | PRN
  Administered 2019-11-24: 100 mL via INTRAVENOUS

## 2019-11-28 ENCOUNTER — Inpatient Hospital Stay: Payer: PPO | Attending: Oncology | Admitting: Oncology

## 2019-11-28 ENCOUNTER — Other Ambulatory Visit: Payer: Self-pay

## 2019-11-28 ENCOUNTER — Inpatient Hospital Stay: Payer: PPO

## 2019-11-28 ENCOUNTER — Encounter: Payer: Self-pay | Admitting: Oncology

## 2019-11-28 VITALS — BP 137/82 | HR 68 | Temp 97.0°F | Resp 16 | Wt 197.8 lb

## 2019-11-28 DIAGNOSIS — I2510 Atherosclerotic heart disease of native coronary artery without angina pectoris: Secondary | ICD-10-CM | POA: Diagnosis not present

## 2019-11-28 DIAGNOSIS — Z8249 Family history of ischemic heart disease and other diseases of the circulatory system: Secondary | ICD-10-CM | POA: Insufficient documentation

## 2019-11-28 DIAGNOSIS — Z85828 Personal history of other malignant neoplasm of skin: Secondary | ICD-10-CM | POA: Insufficient documentation

## 2019-11-28 DIAGNOSIS — L309 Dermatitis, unspecified: Secondary | ICD-10-CM | POA: Insufficient documentation

## 2019-11-28 DIAGNOSIS — I700 Atherosclerosis of aorta: Secondary | ICD-10-CM | POA: Diagnosis not present

## 2019-11-28 DIAGNOSIS — K5730 Diverticulosis of large intestine without perforation or abscess without bleeding: Secondary | ICD-10-CM | POA: Insufficient documentation

## 2019-11-28 DIAGNOSIS — C679 Malignant neoplasm of bladder, unspecified: Secondary | ICD-10-CM | POA: Diagnosis not present

## 2019-11-28 DIAGNOSIS — Z87440 Personal history of urinary (tract) infections: Secondary | ICD-10-CM | POA: Insufficient documentation

## 2019-11-28 DIAGNOSIS — Z79899 Other long term (current) drug therapy: Secondary | ICD-10-CM | POA: Insufficient documentation

## 2019-11-28 DIAGNOSIS — R918 Other nonspecific abnormal finding of lung field: Secondary | ICD-10-CM | POA: Diagnosis not present

## 2019-11-28 DIAGNOSIS — K862 Cyst of pancreas: Secondary | ICD-10-CM | POA: Diagnosis not present

## 2019-11-28 DIAGNOSIS — Z87442 Personal history of urinary calculi: Secondary | ICD-10-CM | POA: Insufficient documentation

## 2019-11-28 DIAGNOSIS — Z882 Allergy status to sulfonamides status: Secondary | ICD-10-CM | POA: Diagnosis not present

## 2019-11-28 DIAGNOSIS — N400 Enlarged prostate without lower urinary tract symptoms: Secondary | ICD-10-CM | POA: Diagnosis not present

## 2019-11-28 DIAGNOSIS — Z08 Encounter for follow-up examination after completed treatment for malignant neoplasm: Secondary | ICD-10-CM | POA: Diagnosis not present

## 2019-11-28 DIAGNOSIS — M47816 Spondylosis without myelopathy or radiculopathy, lumbar region: Secondary | ICD-10-CM | POA: Diagnosis not present

## 2019-11-28 DIAGNOSIS — C779 Secondary and unspecified malignant neoplasm of lymph node, unspecified: Secondary | ICD-10-CM | POA: Diagnosis not present

## 2019-11-28 DIAGNOSIS — K8020 Calculus of gallbladder without cholecystitis without obstruction: Secondary | ICD-10-CM | POA: Insufficient documentation

## 2019-11-28 DIAGNOSIS — I10 Essential (primary) hypertension: Secondary | ICD-10-CM | POA: Diagnosis not present

## 2019-11-28 DIAGNOSIS — Z8042 Family history of malignant neoplasm of prostate: Secondary | ICD-10-CM | POA: Insufficient documentation

## 2019-11-28 DIAGNOSIS — C669 Malignant neoplasm of unspecified ureter: Secondary | ICD-10-CM

## 2019-11-28 DIAGNOSIS — Z8550 Personal history of malignant neoplasm of unspecified urinary tract organ: Secondary | ICD-10-CM | POA: Diagnosis not present

## 2019-11-28 DIAGNOSIS — R21 Rash and other nonspecific skin eruption: Secondary | ICD-10-CM

## 2019-11-28 DIAGNOSIS — Z885 Allergy status to narcotic agent status: Secondary | ICD-10-CM | POA: Diagnosis not present

## 2019-11-28 LAB — CBC WITH DIFFERENTIAL/PLATELET
Abs Immature Granulocytes: 0.03 10*3/uL (ref 0.00–0.07)
Basophils Absolute: 0.1 10*3/uL (ref 0.0–0.1)
Basophils Relative: 1 %
Eosinophils Absolute: 0.2 10*3/uL (ref 0.0–0.5)
Eosinophils Relative: 2 %
HCT: 36.8 % — ABNORMAL LOW (ref 39.0–52.0)
Hemoglobin: 13.2 g/dL (ref 13.0–17.0)
Immature Granulocytes: 1 %
Lymphocytes Relative: 28 %
Lymphs Abs: 1.7 10*3/uL (ref 0.7–4.0)
MCH: 31.1 pg (ref 26.0–34.0)
MCHC: 35.9 g/dL (ref 30.0–36.0)
MCV: 86.8 fL (ref 80.0–100.0)
Monocytes Absolute: 0.6 10*3/uL (ref 0.1–1.0)
Monocytes Relative: 10 %
Neutro Abs: 3.6 10*3/uL (ref 1.7–7.7)
Neutrophils Relative %: 58 %
Platelets: 246 10*3/uL (ref 150–400)
RBC: 4.24 MIL/uL (ref 4.22–5.81)
RDW: 13.7 % (ref 11.5–15.5)
WBC: 6.2 10*3/uL (ref 4.0–10.5)
nRBC: 0 % (ref 0.0–0.2)

## 2019-11-28 LAB — COMPREHENSIVE METABOLIC PANEL
ALT: 23 U/L (ref 0–44)
AST: 33 U/L (ref 15–41)
Albumin: 3.6 g/dL (ref 3.5–5.0)
Alkaline Phosphatase: 39 U/L (ref 38–126)
Anion gap: 7 (ref 5–15)
BUN: 19 mg/dL (ref 8–23)
CO2: 27 mmol/L (ref 22–32)
Calcium: 8.6 mg/dL — ABNORMAL LOW (ref 8.9–10.3)
Chloride: 97 mmol/L — ABNORMAL LOW (ref 98–111)
Creatinine, Ser: 1.02 mg/dL (ref 0.61–1.24)
GFR calc Af Amer: >60 mL/min (ref >60)
GFR calc non Af Amer: >60 mL/min (ref >60)
Glucose, Bld: 87 mg/dL (ref 70–99)
Potassium: 4.1 mmol/L (ref 3.5–5.1)
Sodium: 131 mmol/L — ABNORMAL LOW (ref 135–145)
Total Bilirubin: 0.8 mg/dL (ref 0.3–1.2)
Total Protein: 6.3 g/dL — ABNORMAL LOW (ref 6.5–8.1)

## 2019-11-28 MED ORDER — HEPARIN SOD (PORK) LOCK FLUSH 100 UNIT/ML IV SOLN
INTRAVENOUS | Status: AC
  Filled 2019-11-28: qty 5

## 2019-11-28 MED ORDER — HEPARIN SOD (PORK) LOCK FLUSH 100 UNIT/ML IV SOLN
500.0000 [IU] | Freq: Once | INTRAVENOUS | Status: AC
  Administered 2019-11-28: 500 [IU] via INTRAVENOUS
  Filled 2019-11-28: qty 5

## 2019-11-28 MED ORDER — SODIUM CHLORIDE 0.9% FLUSH
10.0000 mL | INTRAVENOUS | Status: DC | PRN
  Administered 2019-11-28: 10 mL via INTRAVENOUS
  Filled 2019-11-28: qty 10

## 2019-11-28 NOTE — Progress Notes (Signed)
Hematology/Oncology Consult note [facility]  Telephone:([phone] Fax:([phone]  Patient Care Team: Idelle Crouch, MD as PCP - General (Internal Medicine) Sindy Guadeloupe, MD as Consulting Physician (Hematology and Oncology) Sindy Guadeloupe, MD as Consulting Physician (Hematology and Oncology) Sindy Guadeloupe, MD as Consulting Physician (Hematology and Oncology)   Name of the patient: Anthony Gonzales  [id number]  04-Dec-1931   Date of visit: 11/28/19  Diagnosis- Metastatic urothelial carcinoma with possible mets to the obturator node. Indeterminate hilar lymph nodes and LUL lung nodule  Chief complaint/ Reason for visit-discuss CT scan results and further management  Heme/Onc history: patient is a 86 year old male with past medical history significant for hypertension and long-standing history of superficial bladder cancer for which he sees [name]. He has undergone TURBT as well as ureteroscopy since 2017 along with mitomycin as well in the past. Most recently he underwent CT abdomen on 08/06/2017 which showed a soft tissue mass in the right renal pelvis concerning for upper tract urothelial neoplasm. Soft tissue fullness at the right ureterovesical junction with proximal right hydroureteronephrosis. Several millimeter attenuation lesion in the pancreatic head.  He underwent diagnostic ureteroscopy and was found to have 2 high-grade lesions within his right kidney. There was a nodular high-grade appearing lesion in the right anterior renal pelvis. There was also a second ureteral tumor fungating from the right ureteral orifice extending into the distal ureter also consistent with high-grade invasive urothelial carcinoma. Muscle invasion could not be assessed. He also underwent a CT chest which showed a rounded nodule in the right upper lobe measuring 14 mm concerning for metastases. 2 other 3 mm lesions were also noted in the left upper lobe  likely benign  Plan initially was neoadjuvant chemotherapy followed by possible surgery but given the presence of lung lesion patient has been referred to oncology for the same.  PET/CT on 09/21/17 showed:IMPRESSION: 1. Hypermetabolic right obturator lymph node is most indicative of metastatic disease. 2. Mildly hypermetabolic left hilar lymph nodes are nonspecific. Continued attention on follow-up exams is warranted. 3. Right upper lobe pulmonary nodule shows metabolism after just above blood pool and is therefore indeterminate. Continued attention on follow-up exams is warranted. 4. Aortic atherosclerosis (ICD10-170.0). Coronary artery calcification. 5. Cholelithiasis  Carboplatin/ gemzar1 week on and one-week off as patient could not tolerate 2-week on and one week off regimen. Cycle 1 started on 09/22/2017 Disease progression in March 2020. Switched to second [employer]  Patient had disease controlled with Keytruda. However he was noted to have worsening dermatitis with constant flareups requiring steroids. Beryle Flock is therefore being kept on hold and patient will be switched to third line Padcev.Treatment has been on hold since February 2021 due to constant flareup of skin rash  Interval history-patient reports doing well.  He is continuing methotrexate once a week which is controlling his rash well.  He only uses topical steroids at night and he has not required any oral prednisone.  His rash has almost resolved although there are a few spots on his chest wall which come and go  ECOG PS- 1 Pain scale- 0  Review of systems- Review of Systems  Constitutional: Negative for chills, fever, malaise/fatigue and weight loss.  HENT: Negative for congestion, ear discharge and nosebleeds.   Eyes: Negative for blurred vision.  Respiratory: Negative for cough, hemoptysis, sputum production, shortness of breath and wheezing.   Cardiovascular: Negative for chest pain,  palpitations, orthopnea and claudication.  Gastrointestinal: Negative  for abdominal pain, blood in stool, constipation, diarrhea, heartburn, melena, nausea and vomiting.  Genitourinary: Negative for dysuria, flank pain, frequency, hematuria and urgency.  Musculoskeletal: Negative for back pain, joint pain and myalgias.  Skin: Positive for rash.  Neurological: Negative for dizziness, tingling, focal weakness, seizures, weakness and headaches.  Endo/Heme/Allergies: Does not bruise/bleed easily.  Psychiatric/Behavioral: Negative for depression and suicidal ideas. The patient does not have insomnia.       Allergies  Allergen Reactions  . Demerol [Meperidine] Nausea And Vomiting  . Lipitor [Atorvastatin] Swelling  . Sulfa Antibiotics Nausea And Vomiting and Rash     Past Medical History:  Diagnosis Date  . Arthritis   . Benign fibroma of prostate 08/23/2013  . BPH (benign prostatic hyperplasia)   . Calculus of kidney 08/23/2013  . Glaucoma    no drops in 3 mo pressure good, pt denies glaucoma, eye pressure has been measuring alright.  . History of kidney stones   . HLD (hyperlipidemia)   . HOH (hard of hearing)    Left Hearing Aid  . HTN (hypertension) 12/26/2014  . Hypertension   . Hyponatremia 12/26/2014  . Migraines    history of migraines when he was younger.  [name] Restless leg syndrome   . Sinus drainage   . Skin cancer   . Skin cancer    left hand 06/2019  . Urothelial cancer ([place])    chemo tx's.  [name] UTI (lower urinary tract infection) 12/26/2014  . Vertigo      Past Surgical History:  Procedure Laterality Date  . COLONOSCOPY    . CYSTOSCOPY W/ RETROGRADES Right 01/30/2015   Procedure: CYSTOSCOPY WITH RETROGRADE PYELOGRAM;  Surgeon: [name], MD;  Location: [facility];  Service: Urology;  Laterality: Right;  . CYSTOSCOPY W/ RETROGRADES Bilateral 02/26/2016   Procedure: CYSTOSCOPY WITH RETROGRADE PYELOGRAM;  Surgeon: [name], MD;  Location: [facility];   Service: Urology;  Laterality: Bilateral;  . CYSTOSCOPY W/ URETERAL STENT PLACEMENT Right 08/20/2015   Procedure: CYSTOSCOPY WITH RETROGRADE PYELOGRAM/POSSIBLE URETERAL STENT PLACEMENT/BLADDER BIOPSY;  Surgeon: [name], MD;  Location: [facility];  Service: Urology;  Laterality: Right;  . CYSTOSCOPY W/ URETERAL STENT PLACEMENT Right 09/12/2015   Procedure: CYSTOSCOPY WITH STENT REPLACEMENT;  Surgeon: [name], MD;  Location: [facility];  Service: Urology;  Laterality: Right;  . CYSTOSCOPY WITH BIOPSY Right 09/12/2015   Procedure: CYSTOSCOPY WITH BLADDER AND URETERAL BIOPSY;  Surgeon: [name], MD;  Location: [facility];  Service: Urology;  Laterality: Right;  . CYSTOSCOPY WITH STENT PLACEMENT Right 01/30/2015   Procedure: CYSTOSCOPY WITH STENT PLACEMENT;  Surgeon: [name], MD;  Location: [facility];  Service: Urology;  Laterality: Right;  . CYSTOSCOPY WITH STENT PLACEMENT Right 12/28/2017   Procedure: [place] WITH STENT Exchange;  Surgeon: [name], MD;  Location: [facility];  Service: Urology;  Laterality: Right;  . CYSTOSCOPY/URETEROSCOPY/HOLMIUM LASER/STENT PLACEMENT Right 08/19/2017   Procedure: CYSTOSCOPY/URETEROSCOPY/HOLMIUM LASER/STENT PLACEMENT;  Surgeon: [name], MD;  Location: [facility];  Service: Urology;  Laterality: Right;  . EYE SURGERY Bilateral    Cataract Extraction with IOL  . goiter removal    . HOLMIUM LASER APPLICATION N/A 3/41/9379   Procedure:  HOLMIUM LASER APPLICATION;  Surgeon: [name], MD;  Location: [facility];  Service: Urology;  Laterality: N/A;  . PORTA CATH INSERTION N/A 09/23/2017   Procedure: PORTA CATH INSERTION;  Surgeon: Algernon Huxley, MD;  Location: [place] [facility];  Service: Cardiovascular;  Laterality: N/A;  . SPERMATOCELECTOMY    .  TONSILLECTOMY    . TRANSURETHRAL RESECTION OF BLADDER TUMOR N/A 02/26/2016   Procedure: TRANSURETHRAL RESECTION OF BLADDER TUMOR (TURBT);  Surgeon: [name], MD;  Location: [facility];   Service: Urology;  Laterality: N/A;  . TRANSURETHRAL RESECTION OF BLADDER TUMOR WITH MITOMYCIN-C N/A 09/12/2015   Procedure: TRANSURETHRAL RESECTION OF BLADDER TUMOR ;  Surgeon: [name], MD;  Location: [facility];  Service: Urology;  Laterality: N/A;  . TRANSURETHRAL RESECTION OF BLADDER TUMOR WITH MITOMYCIN-C N/A 03/24/2016   Procedure: TRANSURETHRAL RESECTION OF BLADDER TUMOR WITH MITOMYCIN-C  (SMALL);  Surgeon: [name], MD;  Location: [facility];  Service: Urology;  Laterality: N/A;  . URETERAL BIOPSY Right 08/19/2017   Procedure: Renal Mass BIOPSY;  Surgeon: [name], MD;  Location: [facility];  Service: Urology;  Laterality: Right;  . URETEROSCOPY Right 01/30/2015   Procedure: URETEROSCOPY/ WITH BIOPSY AND CYTOLOGY BRUSHING;  Surgeon: [name], MD;  Location: [facility];  Service: Urology;  Laterality: Right;  . URETEROSCOPY Right 08/20/2015   Procedure: URETEROSCOPY;  Surgeon: [name], MD;  Location: [facility];  Service: Urology;  Laterality: Right;  . URETEROSCOPY Right 09/12/2015   Procedure: URETEROSCOPY;  Surgeon: [name], MD;  Location: [facility];  Service: Urology;  Laterality: Right;  . URETEROSCOPY Right 02/26/2016   Procedure: URETEROSCOPY;  Surgeon: [name], MD;  Location: [facility];  Service: Urology;  Laterality: Right;    Social History   Socioeconomic History  . Marital status: Married    Spouse name: Not on file  . Number of children: Not on file  . Years of education: Not on file  . Highest education level: Not on file  Occupational History  . Not on file  Tobacco Use  . Smoking status: Never Smoker  . Smokeless tobacco: Never Used  Vaping Use  . Vaping Use: Never used  Substance and Sexual Activity  . Alcohol use: No    Alcohol/week: 0.0 standard drinks  . Drug use: No  . Sexual activity: Not on file  Other Topics Concern  . Not on file  Social History Narrative  . Not on file   Social Determinants of Health   Financial  Resource Strain:   . Difficulty of Paying Living Expenses: Not on file  Food Insecurity:   . Worried About Charity fundraiser in the Last Year: Not on file  . Ran Out of Food in the Last Year: Not on file  Transportation Needs:   . Lack of Transportation (Medical): Not on file  . Lack of Transportation (Non-Medical): Not on file  Physical Activity:   . Days of Exercise per Week: Not on file  . Minutes of Exercise per Session: Not on file  Stress:   . Feeling of Stress : Not on file  Social Connections:   . Frequency of Communication with Friends and Family: Not on file  . Frequency of Social Gatherings with Friends and Family: Not on file  . Attends Religious Services: Not on file  . Active Member of Clubs or Organizations: Not on file  . Attends Archivist Meetings: Not on file  . Marital Status: Not on file  Intimate Partner Violence:   . Fear of Current or Ex-Partner: Not on file  . Emotionally Abused: Not on file  . Physically Abused: Not on file  . Sexually Abused: Not on file    Family History  Problem Relation Age of Onset  . Hypertension Mother   . Hypertension Father   .  Prostate cancer Brother   . Kidney disease Neg Hx   . Kidney cancer Neg Hx   . Bladder Cancer Neg Hx      Current Outpatient Medications:  .  acetaminophen (TYLENOL) 500 MG tablet, Take 1,000 mg by mouth daily as needed for moderate pain. , Disp: , Rfl:  .  benazepril-hydrochlorthiazide (LOTENSIN HCT) 20-12.5 MG tablet, Take 1 tablet by mouth daily. , Disp: , Rfl:  .  camphor-menthol (SARNA) lotion, Apply 1 application topically as needed for itching., Disp: , Rfl:  .  diazepam (VALIUM) 5 MG tablet, TAKE 1 TABLET (5 MG TOTAL) BY MOUTH EVERY 12 (TWELVE) HOURS AS NEEDED FOR ANXIETY OR SLEEP, Disp: , Rfl:  .  docusate sodium (COLACE) 100 MG capsule, Take 100 mg by mouth 2 (two) times daily. , Disp: , Rfl:  .  fenofibrate micronized (LOFIBRA) 134 MG capsule, Take 134 mg by mouth daily  before breakfast. , Disp: , Rfl:  .  folic acid (FOLVITE) 1 MG tablet, Take 1 mg by mouth as directed. Take it daily on Friday to wed only each week, Disp: , Rfl:  .  hydrOXYzine (ATARAX/VISTARIL) 25 MG tablet, TAKE 1 TABLET BY MOUTH THREE TIMES A DAY AS NEEDED, Disp: 270 tablet, Rfl: 1 .  methotrexate 2.5 MG tablet, Take 10 mg by mouth once a week. Caution:Chemotherapy. Protect from light., Disp: , Rfl:  .  Multiple Vitamin (MULTIVITAMIN WITH MINERALS) TABS tablet, Take 1 tablet by mouth daily., Disp: , Rfl:  .  polyethylene glycol (MIRALAX / GLYCOLAX) 17 g packet, Take 17 g by mouth daily as needed. , Disp: , Rfl:  .  Potassium 99 MG TABS, Take 99 mg by mouth at bedtime., Disp: , Rfl:  .  psyllium (METAMUCIL) 58.6 % packet, Take 1 packet by mouth daily. , Disp: , Rfl:  .  tamsulosin (FLOMAX) 0.4 MG CAPS capsule, Take 1 capsule (0.4 mg total) by mouth daily., Disp: 90 capsule, Rfl: 3 .  triamcinolone cream (KENALOG) 0.1 %, Apply 1 application topically 2 (two) times daily., Disp: , Rfl:  .  amLODipine (NORVASC) 5 MG tablet, Take 5 mg by mouth daily. , Disp: , Rfl:  .  HYDROcodone-acetaminophen (NORCO/VICODIN) 5-325 MG tablet, Take 1 tablet by mouth every 6 (six) hours as needed for severe pain. (Patient not taking: Reported on 07/05/2019), Disp: 30 tablet, Rfl: 0 No current facility-administered medications for this visit.  Facility-Administered Medications Ordered in Other Visits:  .  sodium chloride flush (NS) 0.9 % injection 10 mL, 10 mL, Intravenous, Once, Sindy Guadeloupe, MD .  sodium chloride flush (NS) 0.9 % injection 10 mL, 10 mL, Intravenous, PRN, Sindy Guadeloupe, MD  Physical exam:  Vitals:   11/28/19 1314  BP: 137/82  Pulse: 68  Resp: 16  Temp: (!) 97 F (36.1 C)  TempSrc: Tympanic  SpO2: 99%  Weight: 197 lb 12.8 oz (89.7 kg)   Physical Exam Constitutional:      General: He is not in acute distress. Cardiovascular:     Rate and Rhythm: Normal rate and regular rhythm.      Heart sounds: Normal heart sounds.  Pulmonary:     Effort: Pulmonary effort is normal.     Breath sounds: Normal breath sounds.  Abdominal:     General: Bowel sounds are normal.     Palpations: Abdomen is soft.  Skin:    General: Skin is warm and dry.  Neurological:     Mental Status: He is alert  and oriented to person, place, and time.      CMP Latest Ref Rng & Units 11/28/2019  Glucose 70 - 99 mg/dL 87  BUN 8 - 23 mg/dL 19  Creatinine 0.61 - 1.24 mg/dL 1.02  Sodium 135 - 145 mmol/L 131(L)  Potassium 3.5 - 5.1 mmol/L 4.1  Chloride 98 - 111 mmol/L 97(L)  CO2 22 - 32 mmol/L 27  Calcium 8.9 - 10.3 mg/dL 8.6(L)  Total Protein 6.5 - 8.1 g/dL 6.3(L)  Total Bilirubin 0.3 - 1.2 mg/dL 0.8  Alkaline Phos 38 - 126 U/L 39  AST 15 - 41 U/L 33  ALT 0 - 44 U/L 23   CBC Latest Ref Rng & Units 11/28/2019  WBC 4.0 - 10.5 K/uL 6.2  Hemoglobin 13.0 - 17.0 g/dL 13.2  Hematocrit 39 - 52 % 36.8(L)  Platelets 150 - 400 K/uL 246    No images are attached to the encounter.  CT CHEST ABDOMEN PELVIS W CONTRAST  Result Date: 11/25/2019 CLINICAL DATA:  Carcinoma of distal ureter.  Metastatic.  Restaging EXAM: CT CHEST, ABDOMEN, AND PELVIS WITH CONTRAST TECHNIQUE: Multidetector CT imaging of the chest, abdomen and pelvis was performed following the standard protocol during bolus administration of intravenous contrast. CONTRAST:  126mL OMNIPAQUE IOHEXOL 300 MG/ML  SOLN COMPARISON:  07/12/2019 FINDINGS: CT CHEST FINDINGS Cardiovascular: Normal heart size. Aortic atherosclerosis. Coronary artery atherosclerotic calcifications. No pericardial effusion. Mediastinum/Nodes: Normal appearance of the thyroid gland. The trachea appears patent and is midline. Normal appearance of the esophagus. No axillary, supraclavicular, mediastinal, or hilar adenopathy. Lungs/Pleura: No pleural effusion. Right upper lobe lung nodule measures 1.2 cm, image 75/4. Unchanged from previous exam. Other small, less than 1 cm lung nodules  are stable from previous exam. Musculoskeletal: No chest wall mass or suspicious bone lesions identified. CT ABDOMEN PELVIS FINDINGS Hepatobiliary: Normal appearance of the liver. Gallstones identified. No signs of gallbladder inflammation or bile duct dilatation. Pancreas: Cystic lesion within the uncinate process of pancreas measures 1 cm, image 80/2. Unchanged. No pancreatic inflammation or main duct dilatation identified. Spleen: Normal in size without focal abnormality. Adrenals/Urinary Tract: Normal adrenal glands. Unchanged right lower pole kidney cyst. Small low-density lesion within lateral cortex of the left mid kidney is also unchanged favoring small cysts. No hydronephrosis identified bilaterally. No hydroureter. No suspicious filling defects are noted within the upper urinary tract on the delayed images. No focal bladder lesion identified. Left posterior bladder wall diverticula is again noted measuring 1.5 cm. Stomach/Bowel: Stomach is nondistended. No bowel wall thickening, inflammation or distension. Distal colonic diverticulosis without acute inflammation. Vascular/Lymphatic: Aortic atherosclerosis. No abdominopelvic adenopathy identified. Reproductive: Mild prostate gland enlargement. Other: No free fluid or fluid collections. No signs of peritoneal disease. Musculoskeletal: Spondylosis noted within the lumbar spine. No acute or suspicious osseous findings. IMPRESSION: 1. Stable exam. No new or progressive findings identified within the chest, abdomen or pelvis. 2. Stable appearance of bilateral pulmonary nodules. The largest is in the right upper lobe measuring 1.2 cm 3. Aortic atherosclerosis and coronary artery atherosclerotic calcifications. 4. Gallstones. 5. Stable cystic lesion within the uncinate process of pancreas. Attention on follow-up imaging is advised. 6. Left posterior bladder wall diverticula. 7. Aortic atherosclerosis. Aortic Atherosclerosis (ICD10-I70.0). Electronically Signed    By: Kerby Moors M.D.   On: 11/25/2019 09:19     Assessment and plan- Patient is a 86 y.o. male  withmetastatic urothelial carcinoma with metastases to the lymph nodes.  He is here to discuss CT scan results and further  management  Metastatic urothelial carcinoma: I have reviewed his recent CT chest abdomen and pelvis images independently and discussed findings with the patient.  Scans do not reveal any evidence of recurrence or progressive disease.  He has few lung nodules which have remained stable over the last 1 year.  He has not received any treatment for his urothelial carcinoma since February 2021 due to ongoing skin rash.  At this point I am inclined to hold off on any further treatment unless there is evidence of radiological progression.  Skin rash: Patient will continue weekly methotrexate at this time.  If it resolves completely it may be reasonable to taper him off methotrexate at some point.  He also follows up with Dr. Evorn Gong from dermatology  I will see him back in 3 months with CBC with differential and CMP Visit Diagnosis 1. Encounter for follow-up surveillance of urothelial carcinoma of upper urinary tract      Dr. Randa Evens, MD, MPH [facility] [phone] 11/28/2019 4:04 PM

## 2019-12-19 ENCOUNTER — Ambulatory Visit: Payer: PPO | Attending: Internal Medicine

## 2019-12-19 DIAGNOSIS — Z23 Encounter for immunization: Secondary | ICD-10-CM

## 2019-12-19 NOTE — Progress Notes (Signed)
   Covid-19 Vaccination Clinic  Name:  Anthony Gonzales    MRN: [id number] DOB: 1931-07-01  12/19/2019  Anthony Gonzales was observed post Covid-19 immunization for 15 minutes without incident. He was provided with Vaccine Information Sheet and instruction to access the V-Safe system.   Anthony Gonzales was instructed to call 911 with any severe reactions post vaccine: [name] Difficulty breathing  . Swelling of face and throat  . A fast heartbeat  . A bad rash all over body  . Dizziness and weakness

## 2019-12-26 DIAGNOSIS — Z85828 Personal history of other malignant neoplasm of skin: Secondary | ICD-10-CM | POA: Diagnosis not present

## 2019-12-26 DIAGNOSIS — L308 Other specified dermatitis: Secondary | ICD-10-CM | POA: Diagnosis not present

## 2019-12-26 DIAGNOSIS — L570 Actinic keratosis: Secondary | ICD-10-CM | POA: Diagnosis not present

## 2019-12-26 DIAGNOSIS — L821 Other seborrheic keratosis: Secondary | ICD-10-CM | POA: Diagnosis not present

## 2019-12-26 DIAGNOSIS — D2271 Melanocytic nevi of right lower limb, including hip: Secondary | ICD-10-CM | POA: Diagnosis not present

## 2019-12-26 DIAGNOSIS — L270 Generalized skin eruption due to drugs and medicaments taken internally: Secondary | ICD-10-CM | POA: Diagnosis not present

## 2019-12-26 DIAGNOSIS — X32XXXA Exposure to sunlight, initial encounter: Secondary | ICD-10-CM | POA: Diagnosis not present

## 2019-12-26 DIAGNOSIS — Z79899 Other long term (current) drug therapy: Secondary | ICD-10-CM | POA: Diagnosis not present

## 2020-02-06 ENCOUNTER — Telehealth: Payer: Self-pay | Admitting: *Deleted

## 2020-02-06 NOTE — Telephone Encounter (Signed)
Alonda called and told pt to come in tom. And she made appt for him

## 2020-02-06 NOTE — Telephone Encounter (Signed)
Patient called stating that he thinks his cancer has spread to his left breast. It is sore for 3 weeks now, it does not feel normal has a hard place behind his nipple the size of the end of his thumb. He is asking to be evaluated. Please advise. He said that if he does not answer to leave a message and he will call back

## 2020-02-06 NOTE — Telephone Encounter (Signed)
I will see him tomorrow at 2 pm

## 2020-02-07 ENCOUNTER — Encounter: Payer: Self-pay | Admitting: Oncology

## 2020-02-07 ENCOUNTER — Inpatient Hospital Stay: Payer: PPO | Attending: Oncology | Admitting: Oncology

## 2020-02-07 ENCOUNTER — Other Ambulatory Visit: Payer: Self-pay

## 2020-02-07 VITALS — BP 136/87 | HR 87 | Temp 97.8°F | Resp 16 | Wt 199.9 lb

## 2020-02-07 DIAGNOSIS — Z85828 Personal history of other malignant neoplasm of skin: Secondary | ICD-10-CM | POA: Diagnosis not present

## 2020-02-07 DIAGNOSIS — Z885 Allergy status to narcotic agent status: Secondary | ICD-10-CM | POA: Diagnosis not present

## 2020-02-07 DIAGNOSIS — Z882 Allergy status to sulfonamides status: Secondary | ICD-10-CM | POA: Insufficient documentation

## 2020-02-07 DIAGNOSIS — Z87442 Personal history of urinary calculi: Secondary | ICD-10-CM | POA: Insufficient documentation

## 2020-02-07 DIAGNOSIS — Z8249 Family history of ischemic heart disease and other diseases of the circulatory system: Secondary | ICD-10-CM | POA: Diagnosis not present

## 2020-02-07 DIAGNOSIS — C779 Secondary and unspecified malignant neoplasm of lymph node, unspecified: Secondary | ICD-10-CM | POA: Diagnosis not present

## 2020-02-07 DIAGNOSIS — Z87440 Personal history of urinary (tract) infections: Secondary | ICD-10-CM | POA: Diagnosis not present

## 2020-02-07 DIAGNOSIS — N644 Mastodynia: Secondary | ICD-10-CM | POA: Insufficient documentation

## 2020-02-07 DIAGNOSIS — I700 Atherosclerosis of aorta: Secondary | ICD-10-CM | POA: Insufficient documentation

## 2020-02-07 DIAGNOSIS — Z79899 Other long term (current) drug therapy: Secondary | ICD-10-CM | POA: Diagnosis not present

## 2020-02-07 DIAGNOSIS — C669 Malignant neoplasm of unspecified ureter: Secondary | ICD-10-CM | POA: Diagnosis not present

## 2020-02-07 DIAGNOSIS — N400 Enlarged prostate without lower urinary tract symptoms: Secondary | ICD-10-CM | POA: Diagnosis not present

## 2020-02-07 DIAGNOSIS — C679 Malignant neoplasm of bladder, unspecified: Secondary | ICD-10-CM | POA: Insufficient documentation

## 2020-02-07 DIAGNOSIS — I10 Essential (primary) hypertension: Secondary | ICD-10-CM | POA: Insufficient documentation

## 2020-02-07 DIAGNOSIS — R911 Solitary pulmonary nodule: Secondary | ICD-10-CM | POA: Insufficient documentation

## 2020-02-07 DIAGNOSIS — N1330 Unspecified hydronephrosis: Secondary | ICD-10-CM | POA: Diagnosis not present

## 2020-02-07 DIAGNOSIS — Z8042 Family history of malignant neoplasm of prostate: Secondary | ICD-10-CM | POA: Insufficient documentation

## 2020-02-07 DIAGNOSIS — I2510 Atherosclerotic heart disease of native coronary artery without angina pectoris: Secondary | ICD-10-CM | POA: Insufficient documentation

## 2020-02-08 DIAGNOSIS — R739 Hyperglycemia, unspecified: Secondary | ICD-10-CM | POA: Diagnosis not present

## 2020-02-08 DIAGNOSIS — E782 Mixed hyperlipidemia: Secondary | ICD-10-CM | POA: Diagnosis not present

## 2020-02-08 DIAGNOSIS — I10 Essential (primary) hypertension: Secondary | ICD-10-CM | POA: Diagnosis not present

## 2020-02-08 DIAGNOSIS — G720 Drug-induced myopathy: Secondary | ICD-10-CM | POA: Diagnosis not present

## 2020-02-08 DIAGNOSIS — Z79899 Other long term (current) drug therapy: Secondary | ICD-10-CM | POA: Diagnosis not present

## 2020-02-08 DIAGNOSIS — T466X5A Adverse effect of antihyperlipidemic and antiarteriosclerotic drugs, initial encounter: Secondary | ICD-10-CM | POA: Diagnosis not present

## 2020-02-08 DIAGNOSIS — C649 Malignant neoplasm of unspecified kidney, except renal pelvis: Secondary | ICD-10-CM | POA: Diagnosis not present

## 2020-02-09 NOTE — Progress Notes (Signed)
Hematology/Oncology Consult note [facility]  [phone] [phone]  Patient Care Team: Idelle Crouch, MD as PCP - General (Internal Medicine) Sindy Guadeloupe, MD as Consulting Physician (Hematology and Oncology) Sindy Guadeloupe, MD as Consulting Physician (Hematology and Oncology) Sindy Guadeloupe, MD as Consulting Physician (Hematology and Oncology)   Name of the patient: Anthony Gonzales  [id number]  12-Apr-1931   Date of visit: 02/09/20  Diagnosis- Metastatic urothelial carcinoma with possible mets to the obturator node. Indeterminate hilar lymph nodes and LUL lung nodule   Chief complaint/ Reason for visit- acute visit for left breast pain  Heme/Onc history: patient is a 86 year old male with past medical history significant for hypertension and long-standing history of superficial bladder cancer for which he sees [name]. He has undergone TURBT as well as ureteroscopy since 2017 along with mitomycin as well in the past. Most recently he underwent CT abdomen on 08/06/2017 which showed a soft tissue mass in the right renal pelvis concerning for upper tract urothelial neoplasm. Soft tissue fullness at the right ureterovesical junction with proximal right hydroureteronephrosis. Several millimeter attenuation lesion in the pancreatic head.  He underwent diagnostic ureteroscopy and was found to have 2 high-grade lesions within his right kidney. There was a nodular high-grade appearing lesion in the right anterior renal pelvis. There was also a second ureteral tumor fungating from the right ureteral orifice extending into the distal ureter also consistent with high-grade invasive urothelial carcinoma. Muscle invasion could not be assessed. He also underwent a CT chest which showed a rounded nodule in the right upper lobe measuring 14 mm concerning for metastases. 2 other 3 mm lesions were also noted in the left upper lobe likely  benign  Plan initially was neoadjuvant chemotherapy followed by possible surgery but given the presence of lung lesion patient has been referred to oncology for the same.  PET/CT on 09/21/17 showed:IMPRESSION: 1. Hypermetabolic right obturator lymph node is most indicative of metastatic disease. 2. Mildly hypermetabolic left hilar lymph nodes are nonspecific. Continued attention on follow-up exams is warranted. 3. Right upper lobe pulmonary nodule shows metabolism after just above blood pool and is therefore indeterminate. Continued attention on follow-up exams is warranted. 4. Aortic atherosclerosis (ICD10-170.0). Coronary artery calcification. 5. Cholelithiasis  Carboplatin/ gemzar1 week on and one-week off as patient could not tolerate 2-week on and one week off regimen. Cycle 1 started on 09/22/2017 Disease progression in March 2020. Switched to second [employer]  Patient had disease controlled with Keytruda. However he was noted to have worsening dermatitis with constant flareups requiring steroids. Beryle Flock is therefore being kept on hold and patient will be switched to third line Padcev.Treatment has been on hold since February 2021 due to constant flareup of skin rash  Interval history- Patient reports pain in his left breast for last 4 weeks. Initially it was severe with pain to touch. Reports presently pain is improving. He had a fall about 4 weeks ago when he slipped from his toilet seat. He scrapped his right shoulder and left elbow. Otherwise doing well. Skin rash has remained stable and he is on methotrexate but dose was reduced recently  ECOG PS- 1 Pain scale- 0   Review of systems- Review of Systems  Constitutional: Negative for chills, fever, malaise/fatigue and weight loss.  HENT: Negative for congestion, ear discharge and nosebleeds.   Eyes: Negative for blurred vision.  Respiratory: Negative for cough, hemoptysis, sputum production, shortness of  breath and wheezing.  Cardiovascular: Negative for chest pain, palpitations, orthopnea and claudication.  Gastrointestinal: Negative for abdominal pain, blood in stool, constipation, diarrhea, heartburn, melena, nausea and vomiting.  Genitourinary: Negative for dysuria, flank pain, frequency, hematuria and urgency.  Musculoskeletal: Negative for back pain, joint pain and myalgias.       Left breast pain  Skin: Negative for rash.  Neurological: Negative for dizziness, tingling, focal weakness, seizures, weakness and headaches.  Endo/Heme/Allergies: Does not bruise/bleed easily.  Psychiatric/Behavioral: Negative for depression and suicidal ideas. The patient does not have insomnia.       Allergies  Allergen Reactions  . Demerol [Meperidine] Nausea And Vomiting  . Lipitor [Atorvastatin] Swelling  . Sulfa Antibiotics Nausea And Vomiting and Rash     Past Medical History:  Diagnosis Date  . Arthritis   . Benign fibroma of prostate 08/23/2013  . BPH (benign prostatic hyperplasia)   . Calculus of kidney 08/23/2013  . Glaucoma    no drops in 3 mo pressure good, pt denies glaucoma, eye pressure has been measuring alright.  . History of kidney stones   . HLD (hyperlipidemia)   . HOH (hard of hearing)    Left Hearing Aid  . HTN (hypertension) 12/26/2014  . Hypertension   . Hyponatremia 12/26/2014  . Migraines    history of migraines when he was younger.  [name] Restless leg syndrome   . Sinus drainage   . Skin cancer   . Skin cancer    left hand 06/2019  . Urothelial cancer ([place])    chemo tx's.  [name] UTI (lower urinary tract infection) 12/26/2014  . Vertigo      Past Surgical History:  Procedure Laterality Date  . COLONOSCOPY    . CYSTOSCOPY W/ RETROGRADES Right 01/30/2015   Procedure: CYSTOSCOPY WITH RETROGRADE PYELOGRAM;  Surgeon: [name], MD;  Location: [facility];  Service: Urology;  Laterality: Right;  . CYSTOSCOPY W/ RETROGRADES Bilateral 02/26/2016   Procedure:  CYSTOSCOPY WITH RETROGRADE PYELOGRAM;  Surgeon: [name], MD;  Location: [facility];  Service: Urology;  Laterality: Bilateral;  . CYSTOSCOPY W/ URETERAL STENT PLACEMENT Right 08/20/2015   Procedure: CYSTOSCOPY WITH RETROGRADE PYELOGRAM/POSSIBLE URETERAL STENT PLACEMENT/BLADDER BIOPSY;  Surgeon: [name], MD;  Location: [facility];  Service: Urology;  Laterality: Right;  . CYSTOSCOPY W/ URETERAL STENT PLACEMENT Right 09/12/2015   Procedure: CYSTOSCOPY WITH STENT REPLACEMENT;  Surgeon: [name], MD;  Location: [facility];  Service: Urology;  Laterality: Right;  . CYSTOSCOPY WITH BIOPSY Right 09/12/2015   Procedure: CYSTOSCOPY WITH BLADDER AND URETERAL BIOPSY;  Surgeon: [name], MD;  Location: [facility];  Service: Urology;  Laterality: Right;  . CYSTOSCOPY WITH STENT PLACEMENT Right 01/30/2015   Procedure: CYSTOSCOPY WITH STENT PLACEMENT;  Surgeon: [name], MD;  Location: [facility];  Service: Urology;  Laterality: Right;  . CYSTOSCOPY WITH STENT PLACEMENT Right 12/28/2017   Procedure: [place] WITH STENT Exchange;  Surgeon: [name], MD;  Location: [facility];  Service: Urology;  Laterality: Right;  . CYSTOSCOPY/URETEROSCOPY/HOLMIUM LASER/STENT PLACEMENT Right 08/19/2017   Procedure: CYSTOSCOPY/URETEROSCOPY/HOLMIUM LASER/STENT PLACEMENT;  Surgeon: [name], MD;  Location: [facility];  Service: Urology;  Laterality: Right;  . EYE SURGERY Bilateral    Cataract Extraction with IOL  . goiter removal    . HOLMIUM LASER APPLICATION N/A 7/61/6073   Procedure:  HOLMIUM LASER APPLICATION;  Surgeon: [name], MD;  Location: [facility];  Service: Urology;  Laterality: N/A;  . PORTA CATH INSERTION N/A 09/23/2017   Procedure: PORTA CATH INSERTION;  Surgeon: Lucky Cowboy,  Erskine Squibb, MD;  Location: [facility];  Service: Cardiovascular;  Laterality: N/A;  . SPERMATOCELECTOMY    . TONSILLECTOMY    . TRANSURETHRAL RESECTION OF BLADDER TUMOR N/A 02/26/2016   Procedure: TRANSURETHRAL  RESECTION OF BLADDER TUMOR (TURBT);  Surgeon: [name], MD;  Location: [facility];  Service: Urology;  Laterality: N/A;  . TRANSURETHRAL RESECTION OF BLADDER TUMOR WITH MITOMYCIN-C N/A 09/12/2015   Procedure: TRANSURETHRAL RESECTION OF BLADDER TUMOR ;  Surgeon: [name], MD;  Location: [facility];  Service: Urology;  Laterality: N/A;  . TRANSURETHRAL RESECTION OF BLADDER TUMOR WITH MITOMYCIN-C N/A 03/24/2016   Procedure: TRANSURETHRAL RESECTION OF BLADDER TUMOR WITH MITOMYCIN-C  (SMALL);  Surgeon: [name], MD;  Location: [facility];  Service: Urology;  Laterality: N/A;  . URETERAL BIOPSY Right 08/19/2017   Procedure: Renal Mass BIOPSY;  Surgeon: [name], MD;  Location: [facility];  Service: Urology;  Laterality: Right;  . URETEROSCOPY Right 01/30/2015   Procedure: URETEROSCOPY/ WITH BIOPSY AND CYTOLOGY BRUSHING;  Surgeon: [name], MD;  Location: [facility];  Service: Urology;  Laterality: Right;  . URETEROSCOPY Right 08/20/2015   Procedure: URETEROSCOPY;  Surgeon: [name], MD;  Location: [facility];  Service: Urology;  Laterality: Right;  . URETEROSCOPY Right 09/12/2015   Procedure: URETEROSCOPY;  Surgeon: [name], MD;  Location: [facility];  Service: Urology;  Laterality: Right;  . URETEROSCOPY Right 02/26/2016   Procedure: URETEROSCOPY;  Surgeon: [name], MD;  Location: [facility];  Service: Urology;  Laterality: Right;    Social History   Socioeconomic History  . Marital status: Married    Spouse name: Not on file  . Number of children: Not on file  . Years of education: Not on file  . Highest education level: Not on file  Occupational History  . Not on file  Tobacco Use  . Smoking status: Never Smoker  . Smokeless tobacco: Never Used  Vaping Use  . Vaping Use: Never used  Substance and Sexual Activity  . Alcohol use: No    Alcohol/week: 0.0 standard drinks  . Drug use: No  . Sexual activity: Not Currently  Other Topics Concern  . Not on file    Social History Narrative  . Not on file   Social Determinants of Health   Financial Resource Strain:   . Difficulty of Paying Living Expenses: Not on file  Food Insecurity:   . Worried About Charity fundraiser in the Last Year: Not on file  . Ran Out of Food in the Last Year: Not on file  Transportation Needs:   . Lack of Transportation (Medical): Not on file  . Lack of Transportation (Non-Medical): Not on file  Physical Activity:   . Days of Exercise per Week: Not on file  . Minutes of Exercise per Session: Not on file  Stress:   . Feeling of Stress : Not on file  Social Connections:   . Frequency of Communication with Friends and Family: Not on file  . Frequency of Social Gatherings with Friends and Family: Not on file  . Attends Religious Services: Not on file  . Active Member of Clubs or Organizations: Not on file  . Attends Archivist Meetings: Not on file  . Marital Status: Not on file  Intimate Partner Violence:   . Fear of Current or Ex-Partner: Not on file  . Emotionally Abused: Not on file  . Physically Abused: Not on file  . Sexually Abused: Not on file  Family History  Problem Relation Age of Onset  . Hypertension Mother   . Hypertension Father   . Prostate cancer Brother   . Kidney disease Neg Hx   . Kidney cancer Neg Hx   . Bladder Cancer Neg Hx      Current Outpatient Medications:  .  acetaminophen (TYLENOL) 500 MG tablet, Take 1,000 mg by mouth daily as needed for moderate pain. , Disp: , Rfl:  .  amLODipine (NORVASC) 5 MG tablet, Take 5 mg by mouth daily. , Disp: , Rfl:  .  benazepril-hydrochlorthiazide (LOTENSIN HCT) 20-12.5 MG tablet, Take 1 tablet by mouth daily. , Disp: , Rfl:  .  camphor-menthol (SARNA) lotion, Apply 1 application topically as needed for itching., Disp: , Rfl:  .  diazepam (VALIUM) 5 MG tablet, TAKE 1 TABLET (5 MG TOTAL) BY MOUTH EVERY 12 (TWELVE) HOURS AS NEEDED FOR ANXIETY OR SLEEP, Disp: , Rfl:  .  docusate  sodium (COLACE) 100 MG capsule, Take 100 mg by mouth 2 (two) times daily. , Disp: , Rfl:  .  fenofibrate micronized (LOFIBRA) 134 MG capsule, Take 134 mg by mouth daily before breakfast. , Disp: , Rfl:  .  folic acid (FOLVITE) 1 MG tablet, Take 1 mg by mouth as directed. Take it daily on Friday to wed only each week, Disp: , Rfl:  .  hydrOXYzine (ATARAX/VISTARIL) 25 MG tablet, TAKE 1 TABLET BY MOUTH THREE TIMES A DAY AS NEEDED, Disp: 270 tablet, Rfl: 1 .  methotrexate (RHEUMATREX) 2.5 MG tablet, Take 5 mg by mouth once a week. Caution:Chemotherapy. Protect from light., Disp: , Rfl:  .  Multiple Vitamin (MULTIVITAMIN WITH MINERALS) TABS tablet, Take 1 tablet by mouth daily., Disp: , Rfl:  .  psyllium (METAMUCIL) 58.6 % packet, Take 1 packet by mouth daily. , Disp: , Rfl:  .  tamsulosin (FLOMAX) 0.4 MG CAPS capsule, Take 1 capsule (0.4 mg total) by mouth daily., Disp: 90 capsule, Rfl: 3 .  HYDROcodone-acetaminophen (NORCO/VICODIN) 5-325 MG tablet, Take 1 tablet by mouth every 6 (six) hours as needed for severe pain. (Patient not taking: Reported on 07/05/2019), Disp: 30 tablet, Rfl: 0 .  triamcinolone cream (KENALOG) 0.1 %, Apply 1 application topically 2 (two) times daily. (Patient not taking: Reported on 02/07/2020), Disp: , Rfl:  No current facility-administered medications for this visit.  Facility-Administered Medications Ordered in Other Visits:  .  sodium chloride flush (NS) 0.9 % injection 10 mL, 10 mL, Intravenous, Once, Randa Evens C, MD .  sodium chloride flush (NS) 0.9 % injection 10 mL, 10 mL, Intravenous, PRN, Sindy Guadeloupe, MD  Physical exam: There were no vitals filed for this visit. Physical Exam Constitutional:      General: He is not in acute distress. Cardiovascular:     Rate and Rhythm: Normal rate and regular rhythm.     Heart sounds: Normal heart sounds.  Pulmonary:     Effort: Pulmonary effort is normal.     Breath sounds: Normal breath sounds.  Abdominal:      General: Bowel sounds are normal.     Palpations: Abdomen is soft.  Skin:    General: Skin is warm and dry.  Neurological:     Mental Status: He is alert and oriented to person, place, and time.    Breast exam: no palpable masses in either breast. No evidence of bruising or trauma  CMP Latest Ref Rng & Units 11/28/2019  Glucose 70 - 99 mg/dL 87  BUN 8 -  23 mg/dL 19  Creatinine 0.61 - 1.24 mg/dL 1.02  Sodium 135 - 145 mmol/L 131(L)  Potassium 3.5 - 5.1 mmol/L 4.1  Chloride 98 - 111 mmol/L 97(L)  CO2 22 - 32 mmol/L 27  Calcium 8.9 - 10.3 mg/dL 8.6(L)  Total Protein 6.5 - 8.1 g/dL 6.3(L)  Total Bilirubin 0.3 - 1.2 mg/dL 0.8  Alkaline Phos 38 - 126 U/L 39  AST 15 - 41 U/L 33  ALT 0 - 44 U/L 23   CBC Latest Ref Rng & Units 11/28/2019  WBC 4.0 - 10.5 K/uL 6.2  Hemoglobin 13.0 - 17.0 g/dL 13.2  Hematocrit 39 - 52 % 36.8(L)  Platelets 150 - 400 K/uL 246      Assessment and plan- Patient is a 86 y.o. male withmetastatic urothelial carcinoma with metastases to the lymph nodes.this is an acute visit for left breast pain  I do not palpate any abnormal left breast mass. Patients pain level is going down and unclear if this is related to his fall 4 weeks ago. It would be unusual for urothelial carcinoma to metastasize to this region. Given that his pain is getting better, I would favor watchful monitoring and if pain does not go away I will consider getting an ultrasound to evaluate this area further.  He will otherwise keep his future appointments as scheduled.  Currently he is not receiving any systemic therapy for his urothelial carcinoma given worsening skin rash with every treatment.   Visit Diagnosis 1. 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 Breast pain, left      Dr. Randa Evens, MD, MPH [facility] [phone] 02/09/2020 12:54 PM

## 2020-02-20 ENCOUNTER — Other Ambulatory Visit: Payer: Self-pay | Admitting: Urology

## 2020-02-20 DIAGNOSIS — R3914 Feeling of incomplete bladder emptying: Secondary | ICD-10-CM

## 2020-02-27 ENCOUNTER — Inpatient Hospital Stay: Payer: PPO | Attending: Oncology

## 2020-02-27 ENCOUNTER — Inpatient Hospital Stay (HOSPITAL_BASED_OUTPATIENT_CLINIC_OR_DEPARTMENT_OTHER): Payer: PPO | Admitting: Oncology

## 2020-02-27 ENCOUNTER — Encounter: Payer: Self-pay | Admitting: Oncology

## 2020-02-27 VITALS — BP 153/76 | HR 60 | Temp 97.8°F | Resp 16 | Wt 198.3 lb

## 2020-02-27 DIAGNOSIS — Z882 Allergy status to sulfonamides status: Secondary | ICD-10-CM | POA: Diagnosis not present

## 2020-02-27 DIAGNOSIS — Z8249 Family history of ischemic heart disease and other diseases of the circulatory system: Secondary | ICD-10-CM | POA: Diagnosis not present

## 2020-02-27 DIAGNOSIS — I10 Essential (primary) hypertension: Secondary | ICD-10-CM | POA: Diagnosis not present

## 2020-02-27 DIAGNOSIS — C669 Malignant neoplasm of unspecified ureter: Secondary | ICD-10-CM

## 2020-02-27 DIAGNOSIS — I700 Atherosclerosis of aorta: Secondary | ICD-10-CM | POA: Insufficient documentation

## 2020-02-27 DIAGNOSIS — L538 Other specified erythematous conditions: Secondary | ICD-10-CM | POA: Diagnosis not present

## 2020-02-27 DIAGNOSIS — C779 Secondary and unspecified malignant neoplasm of lymph node, unspecified: Secondary | ICD-10-CM | POA: Diagnosis not present

## 2020-02-27 DIAGNOSIS — L309 Dermatitis, unspecified: Secondary | ICD-10-CM | POA: Insufficient documentation

## 2020-02-27 DIAGNOSIS — Z79899 Other long term (current) drug therapy: Secondary | ICD-10-CM | POA: Diagnosis not present

## 2020-02-27 DIAGNOSIS — K8020 Calculus of gallbladder without cholecystitis without obstruction: Secondary | ICD-10-CM | POA: Diagnosis not present

## 2020-02-27 DIAGNOSIS — M1990 Unspecified osteoarthritis, unspecified site: Secondary | ICD-10-CM | POA: Insufficient documentation

## 2020-02-27 DIAGNOSIS — Z885 Allergy status to narcotic agent status: Secondary | ICD-10-CM | POA: Diagnosis not present

## 2020-02-27 DIAGNOSIS — Z8042 Family history of malignant neoplasm of prostate: Secondary | ICD-10-CM | POA: Diagnosis not present

## 2020-02-27 DIAGNOSIS — Z87442 Personal history of urinary calculi: Secondary | ICD-10-CM | POA: Diagnosis not present

## 2020-02-27 DIAGNOSIS — N1330 Unspecified hydronephrosis: Secondary | ICD-10-CM | POA: Diagnosis not present

## 2020-02-27 DIAGNOSIS — N644 Mastodynia: Secondary | ICD-10-CM | POA: Insufficient documentation

## 2020-02-27 DIAGNOSIS — C676 Malignant neoplasm of ureteric orifice: Secondary | ICD-10-CM | POA: Insufficient documentation

## 2020-02-27 DIAGNOSIS — R911 Solitary pulmonary nodule: Secondary | ICD-10-CM | POA: Insufficient documentation

## 2020-02-27 DIAGNOSIS — Z85828 Personal history of other malignant neoplasm of skin: Secondary | ICD-10-CM | POA: Diagnosis not present

## 2020-02-27 DIAGNOSIS — N400 Enlarged prostate without lower urinary tract symptoms: Secondary | ICD-10-CM | POA: Diagnosis not present

## 2020-02-27 DIAGNOSIS — I2510 Atherosclerotic heart disease of native coronary artery without angina pectoris: Secondary | ICD-10-CM | POA: Diagnosis not present

## 2020-02-27 DIAGNOSIS — Z87440 Personal history of urinary (tract) infections: Secondary | ICD-10-CM | POA: Insufficient documentation

## 2020-02-27 DIAGNOSIS — L270 Generalized skin eruption due to drugs and medicaments taken internally: Secondary | ICD-10-CM

## 2020-02-27 LAB — CBC WITH DIFFERENTIAL/PLATELET
Abs Immature Granulocytes: 0.04 10*3/uL (ref 0.00–0.07)
Basophils Absolute: 0 10*3/uL (ref 0.0–0.1)
Basophils Relative: 0 %
Eosinophils Absolute: 0.1 10*3/uL (ref 0.0–0.5)
Eosinophils Relative: 1 %
HCT: 40.1 % (ref 39.0–52.0)
Hemoglobin: 14.1 g/dL (ref 13.0–17.0)
Immature Granulocytes: 1 %
Lymphocytes Relative: 24 %
Lymphs Abs: 1.7 10*3/uL (ref 0.7–4.0)
MCH: 31.5 pg (ref 26.0–34.0)
MCHC: 35.2 g/dL (ref 30.0–36.0)
MCV: 89.7 fL (ref 80.0–100.0)
Monocytes Absolute: 0.7 10*3/uL (ref 0.1–1.0)
Monocytes Relative: 10 %
Neutro Abs: 4.7 10*3/uL (ref 1.7–7.7)
Neutrophils Relative %: 64 %
Platelets: 271 10*3/uL (ref 150–400)
RBC: 4.47 MIL/uL (ref 4.22–5.81)
RDW: 13.6 % (ref 11.5–15.5)
WBC: 7.3 10*3/uL (ref 4.0–10.5)
nRBC: 0 % (ref 0.0–0.2)

## 2020-02-27 LAB — COMPREHENSIVE METABOLIC PANEL WITH GFR
ALT: 23 U/L (ref 0–44)
AST: 32 U/L (ref 15–41)
Albumin: 3.9 g/dL (ref 3.5–5.0)
Alkaline Phosphatase: 48 U/L (ref 38–126)
Anion gap: 14 (ref 5–15)
BUN: 24 mg/dL — ABNORMAL HIGH (ref 8–23)
CO2: 23 mmol/L (ref 22–32)
Calcium: 9.1 mg/dL (ref 8.9–10.3)
Chloride: 94 mmol/L — ABNORMAL LOW (ref 98–111)
Creatinine, Ser: 1.17 mg/dL (ref 0.61–1.24)
GFR, Estimated: >60 mL/min (ref >60)
Glucose, Bld: 106 mg/dL — ABNORMAL HIGH (ref 70–99)
Potassium: 4.1 mmol/L (ref 3.5–5.1)
Sodium: 131 mmol/L — ABNORMAL LOW (ref 135–145)
Total Bilirubin: 0.6 mg/dL (ref 0.3–1.2)
Total Protein: 6.8 g/dL (ref 6.5–8.1)

## 2020-02-28 NOTE — Progress Notes (Signed)
Hematology/Oncology Consult note [facility]  Telephone:([phone] [phone]  Patient Care Team: Idelle Crouch, MD as PCP - General (Internal Medicine) Sindy Guadeloupe, MD as Consulting Physician (Hematology and Oncology) Sindy Guadeloupe, MD as Consulting Physician (Hematology and Oncology) Sindy Guadeloupe, MD as Consulting Physician (Hematology and Oncology)   Name of the patient: Anthony Gonzales  [id number]  [birth year]   Date of visit: 02/28/20  Diagnosis- Metastatic urothelial carcinoma with possible mets to the obturator node. Indeterminate hilar lymph nodes and LUL lung nodule   Chief complaint/ Reason for visit-routine follow-up of skin rash and left breast pain  Heme/Onc history: patient is a 86 year old male with past medical history significant for hypertension and long-standing history of superficial bladder cancer for which he sees [name]. He has undergone TURBT as well as ureteroscopy since 2017 along with mitomycin as well in the past. Most recently he underwent CT abdomen on 08/06/2017 which showed a soft tissue mass in the right renal pelvis concerning for upper tract urothelial neoplasm. Soft tissue fullness at the right ureterovesical junction with proximal right hydroureteronephrosis. Several millimeter attenuation lesion in the pancreatic head.  He underwent diagnostic ureteroscopy and was found to have 2 high-grade lesions within his right kidney. There was a nodular high-grade appearing lesion in the right anterior renal pelvis. There was also a second ureteral tumor fungating from the right ureteral orifice extending into the distal ureter also consistent with high-grade invasive urothelial carcinoma. Muscle invasion could not be assessed. He also underwent a CT chest which showed a rounded nodule in the right upper lobe measuring 14 mm concerning for metastases. 2 other 3 mm lesions were also noted in the left  upper lobe likely benign  Plan initially was neoadjuvant chemotherapy followed by possible surgery but given the presence of lung lesion patient has been referred to oncology for the same.  PET/CT on 09/21/17 showed:IMPRESSION: 1. Hypermetabolic right obturator lymph node is most indicative of metastatic disease. 2. Mildly hypermetabolic left hilar lymph nodes are nonspecific. Continued attention on follow-up exams is warranted. 3. Right upper lobe pulmonary nodule shows metabolism after just above blood pool and is therefore indeterminate. Continued attention on follow-up exams is warranted. 4. Aortic atherosclerosis (ICD10-170.0). Coronary artery calcification. 5. Cholelithiasis  Carboplatin/ gemzar1 week on and one-week off as patient could not tolerate 2-week on and one week off regimen. Cycle 1 started on 09/22/2017 Disease progression in March 2020. Switched to second [employer]  Patient had disease controlled with Keytruda. However he was noted to have worsening dermatitis with constant flareups requiring steroids. Beryle Flock is therefore being kept on hold and patient will be switched to third line Padcev.Treatment has been on hold since February 2021 due to constant flareup of skin rash  Interval history-patient reports that he is down to taking 1 methotrexate per week from 4.  He does get a few lesions here and there but has not required any topical steroids either.  Reports that his left breast pain is getting better.  Denies any hematuria.  Appetite and weight is stable  ECOG PS- 1 Pain scale- 0 Opioid associated constipation- no  Review of systems- Review of Systems  Constitutional: Positive for malaise/fatigue. Negative for chills, fever and weight loss.  HENT: Negative for congestion, ear discharge and nosebleeds.   Eyes: Negative for blurred vision.  Respiratory: Negative for cough, hemoptysis, sputum production, shortness of breath and wheezing.    Cardiovascular: Negative for chest pain,  palpitations, orthopnea and claudication.  Gastrointestinal: Negative for abdominal pain, blood in stool, constipation, diarrhea, heartburn, melena, nausea and vomiting.  Genitourinary: Negative for dysuria, flank pain, frequency, hematuria and urgency.  Musculoskeletal: Negative for back pain, joint pain and myalgias.  Skin: Positive for rash.  Neurological: Negative for dizziness, tingling, focal weakness, seizures, weakness and headaches.  Endo/Heme/Allergies: Does not bruise/bleed easily.  Psychiatric/Behavioral: Negative for depression and suicidal ideas. The patient does not have insomnia.        Allergies  Allergen Reactions  . Demerol [Meperidine] Nausea And Vomiting  . Lipitor [Atorvastatin] Swelling  . Sulfa Antibiotics Nausea And Vomiting and Rash     Past Medical History:  Diagnosis Date  . Arthritis   . Benign fibroma of prostate 08/23/2013  . BPH (benign prostatic hyperplasia)   . Calculus of kidney 08/23/2013  . Glaucoma    no drops in 3 mo pressure good, pt denies glaucoma, eye pressure has been measuring alright.  . History of kidney stones   . HLD (hyperlipidemia)   . HOH (hard of hearing)    Left Hearing Aid  . HTN (hypertension) 12/26/2014  . Hypertension   . Hyponatremia 12/26/2014  . Migraines    history of migraines when he was younger.  [name] Restless leg syndrome   . Sinus drainage   . Skin cancer   . Skin cancer    left hand 06/2019  . Urothelial cancer ([place])    chemo tx's.  [name] UTI (lower urinary tract infection) 12/26/2014  . Vertigo      Past Surgical History:  Procedure Laterality Date  . COLONOSCOPY    . CYSTOSCOPY W/ RETROGRADES Right 01/30/2015   Procedure: CYSTOSCOPY WITH RETROGRADE PYELOGRAM;  Surgeon: [name], MD;  Location: [facility];  Service: Urology;  Laterality: Right;  . CYSTOSCOPY W/ RETROGRADES Bilateral 02/26/2016   Procedure: CYSTOSCOPY WITH RETROGRADE PYELOGRAM;  Surgeon:  [name], MD;  Location: [facility];  Service: Urology;  Laterality: Bilateral;  . CYSTOSCOPY W/ URETERAL STENT PLACEMENT Right 08/20/2015   Procedure: CYSTOSCOPY WITH RETROGRADE PYELOGRAM/POSSIBLE URETERAL STENT PLACEMENT/BLADDER BIOPSY;  Surgeon: [name], MD;  Location: [facility];  Service: Urology;  Laterality: Right;  . CYSTOSCOPY W/ URETERAL STENT PLACEMENT Right 09/12/2015   Procedure: CYSTOSCOPY WITH STENT REPLACEMENT;  Surgeon: [name], MD;  Location: [facility];  Service: Urology;  Laterality: Right;  . CYSTOSCOPY WITH BIOPSY Right 09/12/2015   Procedure: CYSTOSCOPY WITH BLADDER AND URETERAL BIOPSY;  Surgeon: [name], MD;  Location: [facility];  Service: Urology;  Laterality: Right;  . CYSTOSCOPY WITH STENT PLACEMENT Right 01/30/2015   Procedure: CYSTOSCOPY WITH STENT PLACEMENT;  Surgeon: [name], MD;  Location: [facility];  Service: Urology;  Laterality: Right;  . CYSTOSCOPY WITH STENT PLACEMENT Right 12/28/2017   Procedure: [place] WITH STENT Exchange;  Surgeon: [name], MD;  Location: [facility];  Service: Urology;  Laterality: Right;  . CYSTOSCOPY/URETEROSCOPY/HOLMIUM LASER/STENT PLACEMENT Right 08/19/2017   Procedure: CYSTOSCOPY/URETEROSCOPY/HOLMIUM LASER/STENT PLACEMENT;  Surgeon: [name], MD;  Location: [facility];  Service: Urology;  Laterality: Right;  . EYE SURGERY Bilateral    Cataract Extraction with IOL  . goiter removal    . HOLMIUM LASER APPLICATION N/A 5/39/7673   Procedure:  HOLMIUM LASER APPLICATION;  Surgeon: [name], MD;  Location: [facility];  Service: Urology;  Laterality: N/A;  . PORTA CATH INSERTION N/A 09/23/2017   Procedure: PORTA CATH INSERTION;  Surgeon: Algernon Huxley, MD;  Location: [facility];  Service: Cardiovascular;  Laterality: N/A;  . SPERMATOCELECTOMY    . TONSILLECTOMY    . TRANSURETHRAL RESECTION OF BLADDER TUMOR N/A 02/26/2016   Procedure: TRANSURETHRAL RESECTION OF BLADDER TUMOR (TURBT);  Surgeon:  [name], MD;  Location: [facility];  Service: Urology;  Laterality: N/A;  . TRANSURETHRAL RESECTION OF BLADDER TUMOR WITH MITOMYCIN-C N/A 09/12/2015   Procedure: TRANSURETHRAL RESECTION OF BLADDER TUMOR ;  Surgeon: [name], MD;  Location: [facility];  Service: Urology;  Laterality: N/A;  . TRANSURETHRAL RESECTION OF BLADDER TUMOR WITH MITOMYCIN-C N/A 03/24/2016   Procedure: TRANSURETHRAL RESECTION OF BLADDER TUMOR WITH MITOMYCIN-C  (SMALL);  Surgeon: [name], MD;  Location: [facility];  Service: Urology;  Laterality: N/A;  . URETERAL BIOPSY Right 08/19/2017   Procedure: Renal Mass BIOPSY;  Surgeon: [name], MD;  Location: [facility];  Service: Urology;  Laterality: Right;  . URETEROSCOPY Right 01/30/2015   Procedure: URETEROSCOPY/ WITH BIOPSY AND CYTOLOGY BRUSHING;  Surgeon: [name], MD;  Location: [facility];  Service: Urology;  Laterality: Right;  . URETEROSCOPY Right 08/20/2015   Procedure: URETEROSCOPY;  Surgeon: [name], MD;  Location: [facility];  Service: Urology;  Laterality: Right;  . URETEROSCOPY Right 09/12/2015   Procedure: URETEROSCOPY;  Surgeon: [name], MD;  Location: [facility];  Service: Urology;  Laterality: Right;  . URETEROSCOPY Right 02/26/2016   Procedure: URETEROSCOPY;  Surgeon: [name], MD;  Location: [facility];  Service: Urology;  Laterality: Right;    Social History   Socioeconomic History  . Marital status: Married    Spouse name: Not on file  . Number of children: Not on file  . Years of education: Not on file  . Highest education level: Not on file  Occupational History  . Not on file  Tobacco Use  . Smoking status: Never Smoker  . Smokeless tobacco: Never Used  Vaping Use  . Vaping Use: Never used  Substance and Sexual Activity  . Alcohol use: No    Alcohol/week: 0.0 standard drinks  . Drug use: No  . Sexual activity: Not Currently  Other Topics Concern  . Not on file  Social History Narrative  . Not on file    Social Determinants of Health   Financial Resource Strain: Not on file  Food Insecurity: Not on file  Transportation Needs: Not on file  Physical Activity: Not on file  Stress: Not on file  Social Connections: Not on file  Intimate Partner Violence: Not on file    Family History  Problem Relation Age of Onset  . Hypertension Mother   . Hypertension Father   . Prostate cancer Brother   . Kidney disease Neg Hx   . Kidney cancer Neg Hx   . Bladder Cancer Neg Hx      Current Outpatient Medications:  .  acetaminophen (TYLENOL) 500 MG tablet, Take 1,000 mg by mouth daily as needed for moderate pain. , Disp: , Rfl:  .  benazepril-hydrochlorthiazide (LOTENSIN HCT) 20-12.5 MG tablet, Take 1 tablet by mouth daily. , Disp: , Rfl:  .  camphor-menthol (SARNA) lotion, Apply 1 application topically as needed for itching., Disp: , Rfl:  .  diazepam (VALIUM) 5 MG tablet, TAKE 1 TABLET (5 MG TOTAL) BY MOUTH EVERY 12 (TWELVE) HOURS AS NEEDED FOR ANXIETY OR SLEEP, Disp: , Rfl:  .  docusate sodium (COLACE) 100 MG capsule, Take 100 mg by mouth 2 (two) times daily., Disp: , Rfl:  .  fenofibrate micronized (LOFIBRA) 134 MG capsule, Take 134 mg by mouth  daily before breakfast. , Disp: , Rfl:  .  folic acid (FOLVITE) 1 MG tablet, Take 1 mg by mouth as directed. Take it daily on Friday to wed only each week, Disp: , Rfl:  .  hydrOXYzine (ATARAX/VISTARIL) 25 MG tablet, TAKE 1 TABLET BY MOUTH THREE TIMES A DAY AS NEEDED, Disp: 270 tablet, Rfl: 1 .  methotrexate (RHEUMATREX) 2.5 MG tablet, Take 5 mg by mouth once a week. Caution:Chemotherapy. Protect from light., Disp: , Rfl:  .  Multiple Vitamin (MULTIVITAMIN WITH MINERALS) TABS tablet, Take 1 tablet by mouth daily., Disp: , Rfl:  .  psyllium (METAMUCIL) 58.6 % packet, Take 1 packet by mouth daily., Disp: , Rfl:  .  tamsulosin (FLOMAX) 0.4 MG CAPS capsule, TAKE 1 CAPSULE BY MOUTH EVERY DAY, Disp: 90 capsule, Rfl: 3 .  amLODipine (NORVASC) 5 MG tablet, Take  5 mg by mouth daily. , Disp: , Rfl:  .  HYDROcodone-acetaminophen (NORCO/VICODIN) 5-325 MG tablet, Take 1 tablet by mouth every 6 (six) hours as needed for severe pain. (Patient not taking: No sig reported), Disp: 30 tablet, Rfl: 0 .  triamcinolone cream (KENALOG) 0.1 %, Apply 1 application topically 2 (two) times daily. (Patient not taking: No sig reported), Disp: , Rfl:  No current facility-administered medications for this visit.  Facility-Administered Medications Ordered in Other Visits:  .  sodium chloride flush (NS) 0.9 % injection 10 mL, 10 mL, Intravenous, Once, Sindy Guadeloupe, MD .  sodium chloride flush (NS) 0.9 % injection 10 mL, 10 mL, Intravenous, PRN, Sindy Guadeloupe, MD  Physical exam:  Vitals:   02/27/20 1318  BP: (!) 153/76  Pulse: 60  Resp: 16  Temp: 97.8 F (36.6 C)  TempSrc: Tympanic  SpO2: 99%  Weight: 198 lb 4.8 oz (89.9 kg)   Physical Exam Eyes:     Extraocular Movements: EOM normal.  Cardiovascular:     Rate and Rhythm: Normal rate and regular rhythm.     Heart sounds: Normal heart sounds.  Pulmonary:     Effort: Pulmonary effort is normal.     Breath sounds: Normal breath sounds.  Abdominal:     General: Bowel sounds are normal.     Palpations: Abdomen is soft.  Skin:    General: Skin is warm and dry.     Comments: Few scattered maculopapular erythematous lesions seen over the anterior chest wall.  Fewer lesions noted over the back  Neurological:     Mental Status: He is alert and oriented to person, place, and time.      CMP Latest Ref Rng & Units 02/27/2020  Glucose 70 - 99 mg/dL 106(H)  BUN 8 - 23 mg/dL 24(H)  Creatinine 0.61 - 1.24 mg/dL 1.17  Sodium 135 - 145 mmol/L 131(L)  Potassium 3.5 - 5.1 mmol/L 4.1  Chloride 98 - 111 mmol/L 94(L)  CO2 22 - 32 mmol/L 23  Calcium 8.9 - 10.3 mg/dL 9.1  Total Protein 6.5 - 8.1 g/dL 6.8  Total Bilirubin 0.3 - 1.2 mg/dL 0.6  Alkaline Phos 38 - 126 U/L 48  AST 15 - 41 U/L 32  ALT 0 - 44 U/L 23   CBC  Latest Ref Rng & Units 02/27/2020  WBC 4.0 - 10.5 K/uL 7.3  Hemoglobin 13.0 - 17.0 g/dL 14.1  Hematocrit 39.0 - 52.0 % 40.1  Platelets 150 - 400 K/uL 271      Assessment and plan- Patient is a 86 y.o. male withmetastatic urothelial carcinoma with metastases to the lymph  nodes.  This is a routine follow-up visit for skin rash as well as ongoing left breast pain  Left breast pain is currently getting better.  No palpable masses in the left breast.  Continue to monitor  Skin rash: Overall improved and he is down to taking 1 tablet of methotrexate per week.  Metastatic urothelial carcinoma: Plan to get repeat CT chest abdomen pelvis with contrast in the next 1 to 2 weeks and I will see him thereafter   Visit Diagnosis 1. 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 Randa Evens, MD, MPH [facility] [phone] 02/28/2020 8:47 AM

## 2020-03-07 ENCOUNTER — Other Ambulatory Visit: Payer: Self-pay

## 2020-03-07 ENCOUNTER — Ambulatory Visit
Admission: RE | Admit: 2020-03-07 | Discharge: 2020-03-07 | Disposition: A | Discharge status: Home / Self Care | Payer: PPO | Source: Ambulatory Visit | Attending: Oncology | Admitting: Oncology

## 2020-03-07 ENCOUNTER — Ambulatory Visit: Admission: RE | Admit: 2020-03-07 | Payer: PPO | Source: Ambulatory Visit

## 2020-03-07 DIAGNOSIS — K5730 Diverticulosis of large intestine without perforation or abscess without bleeding: Secondary | ICD-10-CM | POA: Diagnosis not present

## 2020-03-07 DIAGNOSIS — R918 Other nonspecific abnormal finding of lung field: Secondary | ICD-10-CM | POA: Diagnosis not present

## 2020-03-07 DIAGNOSIS — K862 Cyst of pancreas: Secondary | ICD-10-CM | POA: Diagnosis not present

## 2020-03-07 DIAGNOSIS — C669 Malignant neoplasm of unspecified ureter: Secondary | ICD-10-CM | POA: Insufficient documentation

## 2020-03-07 DIAGNOSIS — C661 Malignant neoplasm of right ureter: Secondary | ICD-10-CM | POA: Diagnosis not present

## 2020-03-07 DIAGNOSIS — N62 Hypertrophy of breast: Secondary | ICD-10-CM | POA: Diagnosis not present

## 2020-03-07 DIAGNOSIS — I700 Atherosclerosis of aorta: Secondary | ICD-10-CM | POA: Diagnosis not present

## 2020-03-07 DIAGNOSIS — K8020 Calculus of gallbladder without cholecystitis without obstruction: Secondary | ICD-10-CM | POA: Insufficient documentation

## 2020-03-07 DIAGNOSIS — I2510 Atherosclerotic heart disease of native coronary artery without angina pectoris: Secondary | ICD-10-CM | POA: Insufficient documentation

## 2020-03-07 MED ORDER — IOHEXOL 300 MG/ML  SOLN
100.0000 mL | Freq: Once | INTRAMUSCULAR | Status: AC | PRN
  Administered 2020-03-07: 100 mL via INTRAVENOUS

## 2020-03-26 ENCOUNTER — Inpatient Hospital Stay: Payer: PPO | Admitting: Oncology

## 2020-03-26 ENCOUNTER — Inpatient Hospital Stay: Payer: PPO

## 2020-04-03 ENCOUNTER — Inpatient Hospital Stay (HOSPITAL_BASED_OUTPATIENT_CLINIC_OR_DEPARTMENT_OTHER): Payer: PPO | Admitting: Oncology

## 2020-04-03 ENCOUNTER — Encounter: Payer: Self-pay | Admitting: Oncology

## 2020-04-03 ENCOUNTER — Inpatient Hospital Stay: Payer: PPO | Attending: Oncology

## 2020-04-03 VITALS — BP 130/72 | HR 73 | Temp 97.8°F | Resp 16 | Wt 200.0 lb

## 2020-04-03 DIAGNOSIS — Z85828 Personal history of other malignant neoplasm of skin: Secondary | ICD-10-CM | POA: Diagnosis not present

## 2020-04-03 DIAGNOSIS — N400 Enlarged prostate without lower urinary tract symptoms: Secondary | ICD-10-CM | POA: Insufficient documentation

## 2020-04-03 DIAGNOSIS — K8020 Calculus of gallbladder without cholecystitis without obstruction: Secondary | ICD-10-CM | POA: Insufficient documentation

## 2020-04-03 DIAGNOSIS — I10 Essential (primary) hypertension: Secondary | ICD-10-CM | POA: Insufficient documentation

## 2020-04-03 DIAGNOSIS — Z95828 Presence of other vascular implants and grafts: Secondary | ICD-10-CM

## 2020-04-03 DIAGNOSIS — Z8042 Family history of malignant neoplasm of prostate: Secondary | ICD-10-CM | POA: Diagnosis not present

## 2020-04-03 DIAGNOSIS — I2510 Atherosclerotic heart disease of native coronary artery without angina pectoris: Secondary | ICD-10-CM | POA: Insufficient documentation

## 2020-04-03 DIAGNOSIS — K5730 Diverticulosis of large intestine without perforation or abscess without bleeding: Secondary | ICD-10-CM | POA: Diagnosis not present

## 2020-04-03 DIAGNOSIS — C680 Malignant neoplasm of urethra: Secondary | ICD-10-CM | POA: Insufficient documentation

## 2020-04-03 DIAGNOSIS — Z79899 Other long term (current) drug therapy: Secondary | ICD-10-CM | POA: Insufficient documentation

## 2020-04-03 DIAGNOSIS — L309 Dermatitis, unspecified: Secondary | ICD-10-CM | POA: Insufficient documentation

## 2020-04-03 DIAGNOSIS — Z87442 Personal history of urinary calculi: Secondary | ICD-10-CM | POA: Diagnosis not present

## 2020-04-03 DIAGNOSIS — C669 Malignant neoplasm of unspecified ureter: Secondary | ICD-10-CM | POA: Diagnosis not present

## 2020-04-03 DIAGNOSIS — R590 Localized enlarged lymph nodes: Secondary | ICD-10-CM | POA: Insufficient documentation

## 2020-04-03 DIAGNOSIS — I700 Atherosclerosis of aorta: Secondary | ICD-10-CM | POA: Insufficient documentation

## 2020-04-03 DIAGNOSIS — L270 Generalized skin eruption due to drugs and medicaments taken internally: Secondary | ICD-10-CM | POA: Diagnosis not present

## 2020-04-03 DIAGNOSIS — Z8249 Family history of ischemic heart disease and other diseases of the circulatory system: Secondary | ICD-10-CM | POA: Insufficient documentation

## 2020-04-03 DIAGNOSIS — Z87440 Personal history of urinary (tract) infections: Secondary | ICD-10-CM | POA: Insufficient documentation

## 2020-04-03 DIAGNOSIS — R5383 Other fatigue: Secondary | ICD-10-CM | POA: Diagnosis not present

## 2020-04-03 DIAGNOSIS — N62 Hypertrophy of breast: Secondary | ICD-10-CM | POA: Insufficient documentation

## 2020-04-03 DIAGNOSIS — Z885 Allergy status to narcotic agent status: Secondary | ICD-10-CM | POA: Diagnosis not present

## 2020-04-03 DIAGNOSIS — R911 Solitary pulmonary nodule: Secondary | ICD-10-CM | POA: Diagnosis not present

## 2020-04-03 DIAGNOSIS — Z882 Allergy status to sulfonamides status: Secondary | ICD-10-CM | POA: Insufficient documentation

## 2020-04-03 MED ORDER — HEPARIN SOD (PORK) LOCK FLUSH 100 UNIT/ML IV SOLN
INTRAVENOUS | Status: AC
  Filled 2020-04-03: qty 5

## 2020-04-03 MED ORDER — SODIUM CHLORIDE 0.9% FLUSH
10.0000 mL | INTRAVENOUS | Status: DC | PRN
  Administered 2020-04-03: 10 mL via INTRAVENOUS
  Filled 2020-04-03: qty 10

## 2020-04-03 MED ORDER — HEPARIN SOD (PORK) LOCK FLUSH 100 UNIT/ML IV SOLN
500.0000 [IU] | Freq: Once | INTRAVENOUS | Status: AC
  Administered 2020-04-03: 500 [IU] via INTRAVENOUS
  Filled 2020-04-03: qty 5

## 2020-04-03 NOTE — Progress Notes (Unsigned)
Pt has very few spots of rash- occ. Using a little cream but not very often. He has not walked in several days with the weather and he feels like he is weaker in the legs now. He stopped the methotrexate.

## 2020-04-06 ENCOUNTER — Encounter: Payer: Self-pay | Admitting: Oncology

## 2020-04-06 NOTE — Progress Notes (Signed)
Hematology/Oncology Consult note [facility]  [phone] [phone]  Patient Care Team: Idelle Crouch, MD as PCP - General (Internal Medicine) Sindy Guadeloupe, MD as Consulting Physician (Hematology and Oncology) Sindy Guadeloupe, MD as Consulting Physician (Hematology and Oncology) Sindy Guadeloupe, MD as Consulting Physician (Hematology and Oncology)   Name of the patient: Anthony Gonzales  [id number]  01/09/1932   Date of visit: 04/06/20  Diagnosis- Metastatic urothelial carcinoma with possible mets to the obturator node. Indeterminate hilar lymph nodes and LUL lung nodule   Chief complaint/ Reason for visit-discuss CT scan results  Heme/Onc history: patient is a 87 year old male with past medical history significant for hypertension and long-standing history of superficial bladder cancer for which he sees [name]. He has undergone TURBT as well as ureteroscopy since 2017 along with mitomycin as well in the past. Most recently he underwent CT abdomen on 08/06/2017 which showed a soft tissue mass in the right renal pelvis concerning for upper tract urothelial neoplasm. Soft tissue fullness at the right ureterovesical junction with proximal right hydroureteronephrosis. Several millimeter attenuation lesion in the pancreatic head.  He underwent diagnostic ureteroscopy and was found to have 2 high-grade lesions within his right kidney. There was a nodular high-grade appearing lesion in the right anterior renal pelvis. There was also a second ureteral tumor fungating from the right ureteral orifice extending into the distal ureter also consistent with high-grade invasive urothelial carcinoma. Muscle invasion could not be assessed. He also underwent a CT chest which showed a rounded nodule in the right upper lobe measuring 14 mm concerning for metastases. 2 other 3 mm lesions were also noted in the left upper lobe likely  benign  Plan initially was neoadjuvant chemotherapy followed by possible surgery but given the presence of lung lesion patient has been referred to oncology for the same.  PET/CT on 09/21/17 showed:IMPRESSION: 1. Hypermetabolic right obturator lymph node is most indicative of metastatic disease. 2. Mildly hypermetabolic left hilar lymph nodes are nonspecific. Continued attention on follow-up exams is warranted. 3. Right upper lobe pulmonary nodule shows metabolism after just above blood pool and is therefore indeterminate. Continued attention on follow-up exams is warranted. 4. Aortic atherosclerosis (ICD10-170.0). Coronary artery calcification. 5. Cholelithiasis  Carboplatin/ gemzar1 week on and one-week off as patient could not tolerate 2-week on and one week off regimen. Cycle 1 started on 09/22/2017 Disease progression in March 2020. Switched to second [employer]  Patient had disease controlled with Keytruda. However he was noted to have worsening dermatitis with constant flareups requiring steroids. Beryle Flock is therefore being kept on hold and patient will be switched to third line Padcev.Treatment has been on hold since February 2021 due to constant flareup of skin rash  Interval history-patient has come off methotrexate for the last 2 weeks.  He gets small areas of rash over his chest wall and back which is currently being managed with topical steroids.  Appetite and weight have remained stable.  Denies any new complaints at this time  ECOG PS- 1 Pain scale- 0   Review of systems- Review of Systems  Constitutional: Positive for malaise/fatigue. Negative for chills, fever and weight loss.  HENT: Negative for congestion, ear discharge and nosebleeds.   Eyes: Negative for blurred vision.  Respiratory: Negative for cough, hemoptysis, sputum production, shortness of breath and wheezing.   Cardiovascular: Negative for chest pain, palpitations, orthopnea and  claudication.  Gastrointestinal: Negative for abdominal pain, blood in stool, constipation,  diarrhea, heartburn, melena, nausea and vomiting.  Genitourinary: Negative for dysuria, flank pain, frequency, hematuria and urgency.  Musculoskeletal: Negative for back pain, joint pain and myalgias.  Skin: Negative for rash.  Neurological: Negative for dizziness, tingling, focal weakness, seizures, weakness and headaches.  Endo/Heme/Allergies: Does not bruise/bleed easily.  Psychiatric/Behavioral: Negative for depression and suicidal ideas. The patient does not have insomnia.        Allergies  Allergen Reactions  . Demerol [Meperidine] Nausea And Vomiting  . Lipitor [Atorvastatin] Swelling  . Sulfa Antibiotics Nausea And Vomiting and Rash     Past Medical History:  Diagnosis Date  . Arthritis   . Benign fibroma of prostate 08/23/2013  . BPH (benign prostatic hyperplasia)   . Calculus of kidney 08/23/2013  . Glaucoma    no drops in 3 mo pressure good, pt denies glaucoma, eye pressure has been measuring alright.  . History of kidney stones   . HLD (hyperlipidemia)   . HOH (hard of hearing)    Left Hearing Aid  . HTN (hypertension) 12/26/2014  . Hypertension   . Hyponatremia 12/26/2014  . Migraines    history of migraines when he was younger.  [name] Restless leg syndrome   . Sinus drainage   . Skin cancer   . Skin cancer    left hand 06/2019  . Urothelial cancer ([place])    chemo tx's.  [name] UTI (lower urinary tract infection) 12/26/2014  . Vertigo      Past Surgical History:  Procedure Laterality Date  . COLONOSCOPY    . CYSTOSCOPY W/ RETROGRADES Right 01/30/2015   Procedure: CYSTOSCOPY WITH RETROGRADE PYELOGRAM;  Surgeon: [name], MD;  Location: [facility];  Service: Urology;  Laterality: Right;  . CYSTOSCOPY W/ RETROGRADES Bilateral 02/26/2016   Procedure: CYSTOSCOPY WITH RETROGRADE PYELOGRAM;  Surgeon: [name], MD;  Location: [facility];  Service: Urology;  Laterality:  Bilateral;  . CYSTOSCOPY W/ URETERAL STENT PLACEMENT Right 08/20/2015   Procedure: CYSTOSCOPY WITH RETROGRADE PYELOGRAM/POSSIBLE URETERAL STENT PLACEMENT/BLADDER BIOPSY;  Surgeon: [name], MD;  Location: [facility];  Service: Urology;  Laterality: Right;  . CYSTOSCOPY W/ URETERAL STENT PLACEMENT Right 09/12/2015   Procedure: CYSTOSCOPY WITH STENT REPLACEMENT;  Surgeon: [name], MD;  Location: [facility];  Service: Urology;  Laterality: Right;  . CYSTOSCOPY WITH BIOPSY Right 09/12/2015   Procedure: CYSTOSCOPY WITH BLADDER AND URETERAL BIOPSY;  Surgeon: [name], MD;  Location: [facility];  Service: Urology;  Laterality: Right;  . CYSTOSCOPY WITH STENT PLACEMENT Right 01/30/2015   Procedure: CYSTOSCOPY WITH STENT PLACEMENT;  Surgeon: [name], MD;  Location: [facility];  Service: Urology;  Laterality: Right;  . CYSTOSCOPY WITH STENT PLACEMENT Right 12/28/2017   Procedure: [place] WITH STENT Exchange;  Surgeon: [name], MD;  Location: [facility];  Service: Urology;  Laterality: Right;  . CYSTOSCOPY/URETEROSCOPY/HOLMIUM LASER/STENT PLACEMENT Right 08/19/2017   Procedure: CYSTOSCOPY/URETEROSCOPY/HOLMIUM LASER/STENT PLACEMENT;  Surgeon: [name], MD;  Location: [facility];  Service: Urology;  Laterality: Right;  . EYE SURGERY Bilateral    Cataract Extraction with IOL  . goiter removal    . HOLMIUM LASER APPLICATION N/A [id number]/61/9509   Procedure:  HOLMIUM LASER APPLICATION;  Surgeon: [name], MD;  Location: [facility];  Service: Urology;  Laterality: N/A;  . PORTA CATH INSERTION N/A 09/23/2017   Procedure: PORTA CATH INSERTION;  Surgeon: Algernon Huxley, MD;  Location: [place] [facility];  Service: Cardiovascular;  Laterality: N/A;  . SPERMATOCELECTOMY    . TONSILLECTOMY    .  TRANSURETHRAL RESECTION OF BLADDER TUMOR N/A 02/26/2016   Procedure: TRANSURETHRAL RESECTION OF BLADDER TUMOR (TURBT);  Surgeon: [name], MD;  Location: [facility];  Service: Urology;  Laterality:  N/A;  . TRANSURETHRAL RESECTION OF BLADDER TUMOR WITH MITOMYCIN-C N/A 09/12/2015   Procedure: TRANSURETHRAL RESECTION OF BLADDER TUMOR ;  Surgeon: [name], MD;  Location: [facility];  Service: Urology;  Laterality: N/A;  . TRANSURETHRAL RESECTION OF BLADDER TUMOR WITH MITOMYCIN-C N/A 03/24/2016   Procedure: TRANSURETHRAL RESECTION OF BLADDER TUMOR WITH MITOMYCIN-C  (SMALL);  Surgeon: [name], MD;  Location: [facility];  Service: Urology;  Laterality: N/A;  . URETERAL BIOPSY Right 08/19/2017   Procedure: Renal Mass BIOPSY;  Surgeon: [name], MD;  Location: [facility];  Service: Urology;  Laterality: Right;  . URETEROSCOPY Right 01/30/2015   Procedure: URETEROSCOPY/ WITH BIOPSY AND CYTOLOGY BRUSHING;  Surgeon: [name], MD;  Location: [facility];  Service: Urology;  Laterality: Right;  . URETEROSCOPY Right 08/20/2015   Procedure: URETEROSCOPY;  Surgeon: [name], MD;  Location: [facility];  Service: Urology;  Laterality: Right;  . URETEROSCOPY Right 09/12/2015   Procedure: URETEROSCOPY;  Surgeon: [name], MD;  Location: [facility];  Service: Urology;  Laterality: Right;  . URETEROSCOPY Right 02/26/2016   Procedure: URETEROSCOPY;  Surgeon: [name], MD;  Location: [facility];  Service: Urology;  Laterality: Right;    Social History   Socioeconomic History  . Marital status: Married    Spouse name: Not on file  . Number of children: Not on file  . Years of education: Not on file  . Highest education level: Not on file  Occupational History  . Not on file  Tobacco Use  . Smoking status: Never Smoker  . Smokeless tobacco: Never Used  Vaping Use  . Vaping Use: Never used  Substance and Sexual Activity  . Alcohol use: No    Alcohol/week: 0.0 standard drinks  . Drug use: No  . Sexual activity: Not Currently  Other Topics Concern  . Not on file  Social History Narrative  . Not on file   Social Determinants of Health   Financial Resource Strain: Not on file   Food Insecurity: Not on file  Transportation Needs: Not on file  Physical Activity: Not on file  Stress: Not on file  Social Connections: Not on file  Intimate Partner Violence: Not on file    Family History  Problem Relation Age of Onset  . Hypertension Mother   . Hypertension Father   . Prostate cancer Brother   . Kidney disease Neg Hx   . Kidney cancer Neg Hx   . Bladder Cancer Neg Hx      Current Outpatient Medications:  .  amLODipine (NORVASC) 5 MG tablet, Take 5 mg by mouth daily. , Disp: , Rfl:  .  benazepril-hydrochlorthiazide (LOTENSIN HCT) 20-12.5 MG tablet, Take 1 tablet by mouth daily. , Disp: , Rfl:  .  camphor-menthol (SARNA) lotion, Apply 1 application topically as needed for itching., Disp: , Rfl:  .  diazepam (VALIUM) 5 MG tablet, TAKE 1 TABLET (5 MG TOTAL) BY MOUTH EVERY 12 (TWELVE) HOURS AS NEEDED FOR ANXIETY OR SLEEP, Disp: , Rfl:  .  fenofibrate micronized (LOFIBRA) 134 MG capsule, Take 134 mg by mouth daily before breakfast. , Disp: , Rfl:  .  hydrOXYzine (ATARAX/VISTARIL) 25 MG tablet, TAKE 1 TABLET BY MOUTH THREE TIMES A DAY AS NEEDED, Disp: 270 tablet, Rfl: 1 .  psyllium (METAMUCIL) 58.6 % packet, Take 1  packet by mouth daily., Disp: , Rfl:  .  tamsulosin (FLOMAX) 0.4 MG CAPS capsule, TAKE 1 CAPSULE BY MOUTH EVERY DAY, Disp: 90 capsule, Rfl: 3 .  triamcinolone cream (KENALOG) 0.1 %, Apply 1 application topically 2 (two) times daily., Disp: , Rfl:  .  acetaminophen (TYLENOL) 500 MG tablet, Take 1,000 mg by mouth daily as needed for moderate pain.  (Patient not taking: Reported on 04/03/2020), Disp: , Rfl:  .  HYDROcodone-acetaminophen (NORCO/VICODIN) 5-325 MG tablet, Take 1 tablet by mouth every 6 (six) hours as needed for severe pain. (Patient not taking: No sig reported), Disp: 30 tablet, Rfl: 0 .  methotrexate (RHEUMATREX) 2.5 MG tablet, Take 5 mg by mouth once a week. Caution:Chemotherapy. Protect from light. (Patient not taking: Reported on 04/03/2020),  Disp: , Rfl:  .  Multiple Vitamin (MULTIVITAMIN WITH MINERALS) TABS tablet, Take 1 tablet by mouth daily., Disp: , Rfl:  No current facility-administered medications for this visit.  Facility-Administered Medications Ordered in Other Visits:  .  sodium chloride flush (NS) 0.9 % injection 10 mL, 10 mL, Intravenous, Once, Sindy Guadeloupe, MD .  sodium chloride flush (NS) 0.9 % injection 10 mL, 10 mL, Intravenous, PRN, Sindy Guadeloupe, MD  Physical exam:  Vitals:   04/03/20 1345  BP: 130/72  Pulse: 73  Resp: 16  Temp: 97.8 F (36.6 C)  TempSrc: Oral  Weight: 200 lb (90.7 kg)   Physical Exam Eyes:     Extraocular Movements: EOM normal.  Cardiovascular:     Rate and Rhythm: Normal rate and regular rhythm.     Heart sounds: Normal heart sounds.  Pulmonary:     Effort: Pulmonary effort is normal.     Breath sounds: Normal breath sounds.  Abdominal:     General: Bowel sounds are normal.     Palpations: Abdomen is soft.  Skin:    General: Skin is warm and dry.  Neurological:     Mental Status: He is alert and oriented to person, place, and time.      CMP Latest Ref Rng & Units 02/27/2020  Glucose 70 - 99 mg/dL 106(H)  BUN 8 - 23 mg/dL 24(H)  Creatinine 0.61 - 1.24 mg/dL 1.17  Sodium 135 - 145 mmol/L 131(L)  Potassium 3.5 - 5.1 mmol/L 4.1  Chloride 98 - 111 mmol/L 94(L)  CO2 22 - 32 mmol/L 23  Calcium 8.9 - 10.3 mg/dL 9.1  Total Protein 6.5 - 8.1 g/dL 6.8  Total Bilirubin 0.3 - 1.2 mg/dL 0.6  Alkaline Phos 38 - 126 U/L 48  AST 15 - 41 U/L 32  ALT 0 - 44 U/L 23   CBC Latest Ref Rng & Units 02/27/2020  WBC 4.0 - 10.5 K/uL 7.3  Hemoglobin 13.0 - 17.0 g/dL 14.1  Hematocrit 39.0 - 52.0 % 40.1  Platelets 150 - 400 K/uL 271    No images are attached to the encounter.  CT CHEST ABDOMEN PELVIS W CONTRAST  Result Date: 03/07/2020 CLINICAL DATA:  Restaging of RIGHT ureteral cancer diagnosed in June of 2019 EXAM: CT CHEST, ABDOMEN, AND PELVIS WITH CONTRAST TECHNIQUE:  Multidetector CT imaging of the chest, abdomen and pelvis was performed following the standard protocol during bolus administration of intravenous contrast. CONTRAST:  122mL OMNIPAQUE IOHEXOL 300 MG/ML  SOLN COMPARISON:  CT of the chest, abdomen and pelvis from September of 2021. FINDINGS: CT CHEST FINDINGS Cardiovascular: RIGHT-sided Port-A-Cath in situ terminating at the caval to atrial junction. Calcified and noncalcified plaque in the thoracic  aorta. No aneurysmal dilation central pulmonary vasculature is normal caliber. Signs of calcified coronary artery disease of LEFT coronary circulation and RIGHT coronary circulation. Mediastinum/Nodes: Thoracic inlet structures are normal. No axillary lymphadenopathy. No mediastinal or hilar lymphadenopathy. Esophagus grossly normal. Lungs/Pleura: Lobulated pulmonary nodule (image 79, series 3) 1.3 cm stable dating back to 2020. Tiny nodule in the RIGHT upper lobe (image 76, series 3) 4 mm also stable. No consolidation.  No pleural effusion.  Airways are patent. Musculoskeletal: Gynecomastia. No acute process related to the bony thorax with signs of spinal degenerative changes. CT ABDOMEN PELVIS FINDINGS Hepatobiliary: Mildly lobular hepatic contours. Appearance is unchanged compared to prior imaging with patent portal vein and hepatic veins. No pericholecystic stranding with cholelithiasis dependently in the gallbladder. Pancreas: No main duct dilation. Cystic lesions in the head of the pancreas largest at approximately 12-13 mm is unchanged compared to the prior study. No signs of pancreatic inflammation. Spleen: Normal Adrenals/Urinary Tract: Normal appearance of the adrenal glands. Symmetric renal enhancement. No hydronephrosis. Urinary bladder is smooth with respect to contour the exception of a posterolateral LEFT urinary bladder diverticulum. Small presumed cyst in the upper pole the LEFT kidney. Mild stranding about the RIGHT renal pelvis similar to the previous  exam. No visible filling defect on delayed ease which include only upper tracts through the iliac crests. RIGHT renal cyst arising from the lower pole is unchanged. Stomach/Bowel: No acute gastrointestinal process. The appendix is normal. Colonic diverticulosis. No sign of diverticulitis. Vascular/Lymphatic: Aortic atherosclerosis both calcified and noncalcified plaque in the abdominal aorta. There is no gastrohepatic or hepatoduodenal ligament lymphadenopathy. No retroperitoneal or mesenteric lymphadenopathy. No pelvic sidewall lymphadenopathy. Reproductive: Heterogeneous prostate. Nonspecific finding, mild prostatomegaly. Findings are unchanged. Other: No ascites.  No free air. Musculoskeletal: Spinal degenerative changes. No acute or destructive bone process. IMPRESSION: 1. No new or progressive findings in the chest, abdomen or pelvis. 2. Stable pulmonary nodules. 3. Cholelithiasis. 4. Colonic diverticulosis without sign of diverticulitis. 5. Cystic lesions in the head of the pancreas largest at approximately 12-13 mm, unchanged compared to the prior study. Likely small intraductal papillary mucinous neoplasm, attention on follow-up. 6. Signs of calcified coronary artery disease. 7. Aortic atherosclerosis. Aortic Atherosclerosis (ICD10-I70.0). Electronically Signed   By: Zetta Bills M.D.   On: 03/07/2020 10:22     Assessment and plan- Patient is a 87 y.o. male withmetastatic urothelial carcinoma with metastases to the lymph nodes.  Patient was on Padcev until February 2021 which was on hold due to skin rash.  He is here to discuss CT scan results  Skin rash: Currently stable with topical steroids and he has come off methotrexate.  Continue to monitor  Metastatic urothelial cancer with lymph node metastases: Padcev treatment has been on hold for 1 year since February 2021.  I have reviewed CT chest abdomen pelvis images independently and discussed findings with the patient.Lung nodules have currently  remained stable and there are no new concerning findings of progressive disease.  I will continue to hold his treatment unless there is evidence of radiological progression.  I will see him back in 4 months with port labs CBC with differential and CMP with CT chest abdomen pelvis with contrast prior   Visit Diagnosis 1. 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 Drug-induced skin rash      Dr. Randa Evens, MD, MPH [facility] at [facility] [phone] 04/06/2020 8:45 AM

## 2020-05-01 DIAGNOSIS — C4441 Basal cell carcinoma of skin of scalp and neck: Secondary | ICD-10-CM | POA: Diagnosis not present

## 2020-05-01 DIAGNOSIS — L570 Actinic keratosis: Secondary | ICD-10-CM | POA: Diagnosis not present

## 2020-05-01 DIAGNOSIS — D2262 Melanocytic nevi of left upper limb, including shoulder: Secondary | ICD-10-CM | POA: Diagnosis not present

## 2020-05-01 DIAGNOSIS — D2261 Melanocytic nevi of right upper limb, including shoulder: Secondary | ICD-10-CM | POA: Diagnosis not present

## 2020-05-01 DIAGNOSIS — Z85828 Personal history of other malignant neoplasm of skin: Secondary | ICD-10-CM | POA: Diagnosis not present

## 2020-05-01 DIAGNOSIS — X32XXXA Exposure to sunlight, initial encounter: Secondary | ICD-10-CM | POA: Diagnosis not present

## 2020-05-01 DIAGNOSIS — L821 Other seborrheic keratosis: Secondary | ICD-10-CM | POA: Diagnosis not present

## 2020-05-01 DIAGNOSIS — D2272 Melanocytic nevi of left lower limb, including hip: Secondary | ICD-10-CM | POA: Diagnosis not present

## 2020-05-01 DIAGNOSIS — D485 Neoplasm of uncertain behavior of skin: Secondary | ICD-10-CM | POA: Diagnosis not present

## 2020-05-08 DIAGNOSIS — I10 Essential (primary) hypertension: Secondary | ICD-10-CM | POA: Diagnosis not present

## 2020-05-08 DIAGNOSIS — Z79899 Other long term (current) drug therapy: Secondary | ICD-10-CM | POA: Diagnosis not present

## 2020-05-08 DIAGNOSIS — E782 Mixed hyperlipidemia: Secondary | ICD-10-CM | POA: Diagnosis not present

## 2020-05-08 DIAGNOSIS — R739 Hyperglycemia, unspecified: Secondary | ICD-10-CM | POA: Diagnosis not present

## 2020-05-15 DIAGNOSIS — I10 Essential (primary) hypertension: Secondary | ICD-10-CM | POA: Diagnosis not present

## 2020-05-15 DIAGNOSIS — R739 Hyperglycemia, unspecified: Secondary | ICD-10-CM | POA: Diagnosis not present

## 2020-05-15 DIAGNOSIS — G720 Drug-induced myopathy: Secondary | ICD-10-CM | POA: Diagnosis not present

## 2020-05-15 DIAGNOSIS — E782 Mixed hyperlipidemia: Secondary | ICD-10-CM | POA: Diagnosis not present

## 2020-05-15 DIAGNOSIS — Z0000 Encounter for general adult medical examination without abnormal findings: Secondary | ICD-10-CM | POA: Diagnosis not present

## 2020-05-15 DIAGNOSIS — T466X5A Adverse effect of antihyperlipidemic and antiarteriosclerotic drugs, initial encounter: Secondary | ICD-10-CM | POA: Diagnosis not present

## 2020-06-12 DIAGNOSIS — C44319 Basal cell carcinoma of skin of other parts of face: Secondary | ICD-10-CM | POA: Diagnosis not present

## 2020-08-01 ENCOUNTER — Ambulatory Visit
Admission: RE | Admit: 2020-08-01 | Discharge: 2020-08-01 | Disposition: A | Discharge status: Home / Self Care | Payer: PPO | Source: Ambulatory Visit | Attending: Oncology | Admitting: Oncology

## 2020-08-01 ENCOUNTER — Other Ambulatory Visit: Payer: Self-pay

## 2020-08-01 ENCOUNTER — Inpatient Hospital Stay: Payer: PPO | Attending: Oncology

## 2020-08-01 DIAGNOSIS — Z882 Allergy status to sulfonamides status: Secondary | ICD-10-CM | POA: Insufficient documentation

## 2020-08-01 DIAGNOSIS — K5730 Diverticulosis of large intestine without perforation or abscess without bleeding: Secondary | ICD-10-CM | POA: Insufficient documentation

## 2020-08-01 DIAGNOSIS — Z8249 Family history of ischemic heart disease and other diseases of the circulatory system: Secondary | ICD-10-CM | POA: Diagnosis not present

## 2020-08-01 DIAGNOSIS — Z885 Allergy status to narcotic agent status: Secondary | ICD-10-CM | POA: Diagnosis not present

## 2020-08-01 DIAGNOSIS — K8020 Calculus of gallbladder without cholecystitis without obstruction: Secondary | ICD-10-CM | POA: Diagnosis not present

## 2020-08-01 DIAGNOSIS — Z8042 Family history of malignant neoplasm of prostate: Secondary | ICD-10-CM | POA: Diagnosis not present

## 2020-08-01 DIAGNOSIS — R911 Solitary pulmonary nodule: Secondary | ICD-10-CM | POA: Insufficient documentation

## 2020-08-01 DIAGNOSIS — Z85828 Personal history of other malignant neoplasm of skin: Secondary | ICD-10-CM | POA: Diagnosis not present

## 2020-08-01 DIAGNOSIS — L309 Dermatitis, unspecified: Secondary | ICD-10-CM | POA: Diagnosis not present

## 2020-08-01 DIAGNOSIS — Z87440 Personal history of urinary (tract) infections: Secondary | ICD-10-CM | POA: Diagnosis not present

## 2020-08-01 DIAGNOSIS — R918 Other nonspecific abnormal finding of lung field: Secondary | ICD-10-CM | POA: Diagnosis not present

## 2020-08-01 DIAGNOSIS — C661 Malignant neoplasm of right ureter: Secondary | ICD-10-CM | POA: Insufficient documentation

## 2020-08-01 DIAGNOSIS — Z87442 Personal history of urinary calculi: Secondary | ICD-10-CM | POA: Diagnosis not present

## 2020-08-01 DIAGNOSIS — I700 Atherosclerosis of aorta: Secondary | ICD-10-CM | POA: Insufficient documentation

## 2020-08-01 DIAGNOSIS — I2510 Atherosclerotic heart disease of native coronary artery without angina pectoris: Secondary | ICD-10-CM | POA: Insufficient documentation

## 2020-08-01 DIAGNOSIS — N62 Hypertrophy of breast: Secondary | ICD-10-CM | POA: Diagnosis not present

## 2020-08-01 DIAGNOSIS — K862 Cyst of pancreas: Secondary | ICD-10-CM | POA: Insufficient documentation

## 2020-08-01 DIAGNOSIS — I10 Essential (primary) hypertension: Secondary | ICD-10-CM | POA: Diagnosis not present

## 2020-08-01 DIAGNOSIS — K8080 Other cholelithiasis without obstruction: Secondary | ICD-10-CM | POA: Insufficient documentation

## 2020-08-01 DIAGNOSIS — C669 Malignant neoplasm of unspecified ureter: Secondary | ICD-10-CM | POA: Insufficient documentation

## 2020-08-01 DIAGNOSIS — Z79899 Other long term (current) drug therapy: Secondary | ICD-10-CM | POA: Insufficient documentation

## 2020-08-01 DIAGNOSIS — C679 Malignant neoplasm of bladder, unspecified: Secondary | ICD-10-CM | POA: Diagnosis not present

## 2020-08-01 LAB — COMPREHENSIVE METABOLIC PANEL
ALT: 19 U/L (ref 0–44)
AST: 30 U/L (ref 15–41)
Albumin: 3.7 g/dL (ref 3.5–5.0)
Alkaline Phosphatase: 44 U/L (ref 38–126)
Anion gap: 9 (ref 5–15)
BUN: 20 mg/dL (ref 8–23)
CO2: 26 mmol/L (ref 22–32)
Calcium: 8.6 mg/dL — ABNORMAL LOW (ref 8.9–10.3)
Chloride: 97 mmol/L — ABNORMAL LOW (ref 98–111)
Creatinine, Ser: 1.09 mg/dL (ref 0.61–1.24)
GFR, Estimated: >60 mL/min (ref >60)
Glucose, Bld: 98 mg/dL (ref 70–99)
Potassium: 3.9 mmol/L (ref 3.5–5.1)
Sodium: 132 mmol/L — ABNORMAL LOW (ref 135–145)
Total Bilirubin: 0.8 mg/dL (ref 0.3–1.2)
Total Protein: 6.6 g/dL (ref 6.5–8.1)

## 2020-08-01 LAB — CBC WITH DIFFERENTIAL/PLATELET
Abs Immature Granulocytes: 0.02 10*3/uL (ref 0.00–0.07)
Basophils Absolute: 0 10*3/uL (ref 0.0–0.1)
Basophils Relative: 1 %
Eosinophils Absolute: 0.2 10*3/uL (ref 0.0–0.5)
Eosinophils Relative: 3 %
HCT: 40.9 % (ref 39.0–52.0)
Hemoglobin: 14 g/dL (ref 13.0–17.0)
Immature Granulocytes: 0 %
Lymphocytes Relative: 32 %
Lymphs Abs: 1.8 10*3/uL (ref 0.7–4.0)
MCH: 29.5 pg (ref 26.0–34.0)
MCHC: 34.2 g/dL (ref 30.0–36.0)
MCV: 86.1 fL (ref 80.0–100.0)
Monocytes Absolute: 0.6 10*3/uL (ref 0.1–1.0)
Monocytes Relative: 10 %
Neutro Abs: 3 10*3/uL (ref 1.7–7.7)
Neutrophils Relative %: 54 %
Platelets: 240 10*3/uL (ref 150–400)
RBC: 4.75 MIL/uL (ref 4.22–5.81)
RDW: 13.1 % (ref 11.5–15.5)
WBC: 5.6 10*3/uL (ref 4.0–10.5)
nRBC: 0 % (ref 0.0–0.2)

## 2020-08-01 MED ORDER — HEPARIN SOD (PORK) LOCK FLUSH 100 UNIT/ML IV SOLN
INTRAVENOUS | Status: AC
  Filled 2020-08-01: qty 5

## 2020-08-01 MED ORDER — IOHEXOL 300 MG/ML  SOLN
100.0000 mL | Freq: Once | INTRAMUSCULAR | Status: AC | PRN
  Administered 2020-08-01: 100 mL via INTRAVENOUS

## 2020-08-01 MED ORDER — SODIUM CHLORIDE 0.9% FLUSH
10.0000 mL | Freq: Once | INTRAVENOUS | Status: AC
  Administered 2020-08-01: 10 mL via INTRAVENOUS
  Filled 2020-08-01: qty 10

## 2020-08-01 MED ORDER — HEPARIN SOD (PORK) LOCK FLUSH 100 UNIT/ML IV SOLN
500.0000 [IU] | Freq: Once | INTRAVENOUS | Status: AC
  Administered 2020-08-01: 500 [IU] via INTRAVENOUS
  Filled 2020-08-01: qty 5

## 2020-08-02 ENCOUNTER — Telehealth: Payer: Self-pay | Admitting: *Deleted

## 2020-08-02 NOTE — Telephone Encounter (Signed)
pt had port flush with labs 5/25 so he does not need them again 5/27. I have called and left message for the pt. to just come for MD visit-he does not need to repeat labs that he had 5/25.

## 2020-08-03 ENCOUNTER — Inpatient Hospital Stay: Payer: PPO

## 2020-08-03 ENCOUNTER — Encounter: Payer: Self-pay | Admitting: Oncology

## 2020-08-03 ENCOUNTER — Other Ambulatory Visit: Payer: Self-pay | Admitting: *Deleted

## 2020-08-03 ENCOUNTER — Inpatient Hospital Stay: Payer: PPO | Admitting: Oncology

## 2020-08-03 VITALS — BP 157/73 | HR 68 | Temp 98.5°F | Wt 202.5 lb

## 2020-08-03 DIAGNOSIS — C669 Malignant neoplasm of unspecified ureter: Secondary | ICD-10-CM | POA: Diagnosis not present

## 2020-08-03 DIAGNOSIS — C661 Malignant neoplasm of right ureter: Secondary | ICD-10-CM | POA: Diagnosis not present

## 2020-08-03 MED ORDER — NYSTATIN 100000 UNIT/GM EX POWD: 1.0000 "application " | Freq: Three times a day (TID) | CUTANEOUS | 0 refills | Status: DC

## 2020-08-04 ENCOUNTER — Encounter: Payer: Self-pay | Admitting: Oncology

## 2020-08-04 NOTE — Progress Notes (Signed)
Hematology/Oncology Consult note [facility]  Telephone:([phone] Fax:([phone]) [phone]  Patient Care Team: Idelle Crouch, MD as PCP - General (Internal Medicine) Sindy Guadeloupe, MD as Consulting Physician (Hematology and Oncology) Sindy Guadeloupe, MD as Consulting Physician (Hematology and Oncology) Sindy Guadeloupe, MD as Consulting Physician (Hematology and Oncology)   Name of the patient: Anthony Gonzales  [id number]  [birth year]   Date of visit: 08/04/20  Diagnosis- Metastatic urothelial carcinoma with possible mets to the obturator node. Indeterminate hilar lymph nodes and LUL lung nodule currently in remission   Chief complaint/ Reason for visit-discuss CT scan results and further management  Heme/Onc history: patient is a 87 year old male with past medical history significant for hypertension and long-standing history of superficial bladder cancer for which he sees [name]. He has undergone TURBT as well as ureteroscopy since 2017 along with mitomycin as well in the past. Most recently he underwent CT abdomen on 08/06/2017 which showed a soft tissue mass in the right renal pelvis concerning for upper tract urothelial neoplasm. Soft tissue fullness at the right ureterovesical junction with proximal right hydroureteronephrosis. Several millimeter attenuation lesion in the pancreatic head.  He underwent diagnostic ureteroscopy and was found to have 2 high-grade lesions within his right kidney. There was a nodular high-grade appearing lesion in the right anterior renal pelvis. There was also a second ureteral tumor fungating from the right ureteral orifice extending into the distal ureter also consistent with high-grade invasive urothelial carcinoma. Muscle invasion could not be assessed. He also underwent a CT chest which showed a rounded nodule in the right upper lobe measuring 14 mm concerning for metastases. 2 other 3 mm lesions were also  noted in the left upper lobe likely benign  Plan initially was neoadjuvant chemotherapy followed by possible surgery but given the presence of lung lesion patient has been referred to oncology for the same.  PET/CT on 09/21/17 showed:IMPRESSION: 1. Hypermetabolic right obturator lymph node is most indicative of metastatic disease. 2. Mildly hypermetabolic left hilar lymph nodes are nonspecific. Continued attention on follow-up exams is warranted. 3. Right upper lobe pulmonary nodule shows metabolism after just above blood pool and is therefore indeterminate. Continued attention on follow-up exams is warranted. 4. Aortic atherosclerosis (ICD10-170.0). Coronary artery calcification. 5. Cholelithiasis  Carboplatin/ gemzar1 week on and one-week off as patient could not tolerate 2-week on and one week off regimen. Cycle 1 started on 09/22/2017 Disease progression in March 2020. Switched to second [employer]  Patient had disease controlled with Keytruda. However he was noted to have worsening dermatitis with constant flareups requiring steroids. Beryle Flock is therefore being kept on hold and patient will be switched to third line Padcev.Treatment has been on hold since February 2021 due to constant flareup of skin rash   Interval history-patient is doing well presently.  He has occasional rash over his abdominal wall which does not bother him.  He is not on methotrexate.  He uses topical steroids occasionally  ECOG PS- 1 Pain scale- 0   Review of systems- Review of Systems  Constitutional: Negative for chills, fever, malaise/fatigue and weight loss.  HENT: Negative for congestion, ear discharge and nosebleeds.   Eyes: Negative for blurred vision.  Respiratory: Negative for cough, hemoptysis, sputum production, shortness of breath and wheezing.   Cardiovascular: Negative for chest pain, palpitations, orthopnea and claudication.  Gastrointestinal: Negative for abdominal pain,  blood in stool, constipation, diarrhea, heartburn, melena, nausea and vomiting.  Genitourinary: Negative for  dysuria, flank pain, frequency, hematuria and urgency.  Musculoskeletal: Negative for back pain, joint pain and myalgias.  Skin: Negative for rash.  Neurological: Negative for dizziness, tingling, focal weakness, seizures, weakness and headaches.  Endo/Heme/Allergies: Does not bruise/bleed easily.  Psychiatric/Behavioral: Negative for depression and suicidal ideas. The patient does not have insomnia.       Allergies  Allergen Reactions  . Demerol [Meperidine] Nausea And Vomiting  . Lipitor [Atorvastatin] Swelling  . Sulfa Antibiotics Nausea And Vomiting and Rash     Past Medical History:  Diagnosis Date  . Arthritis   . Benign fibroma of prostate 08/23/2013  . BPH (benign prostatic hyperplasia)   . Calculus of kidney 08/23/2013  . Glaucoma    no drops in 3 mo pressure good, pt denies glaucoma, eye pressure has been measuring alright.  . History of kidney stones   . HLD (hyperlipidemia)   . HOH (hard of hearing)    Left Hearing Aid  . HTN (hypertension) 12/26/2014  . Hypertension   . Hyponatremia 12/26/2014  . Migraines    history of migraines when he was younger.  [name] Restless leg syndrome   . Sinus drainage   . Skin cancer   . Skin cancer    left hand 06/2019  . Urothelial cancer ([place])    chemo tx's.  [name] UTI (lower urinary tract infection) 12/26/2014  . Vertigo      Past Surgical History:  Procedure Laterality Date  . COLONOSCOPY    . CYSTOSCOPY W/ RETROGRADES Right 01/30/2015   Procedure: CYSTOSCOPY WITH RETROGRADE PYELOGRAM;  Surgeon: [name], MD;  Location: [facility];  Service: Urology;  Laterality: Right;  . CYSTOSCOPY W/ RETROGRADES Bilateral 02/26/2016   Procedure: CYSTOSCOPY WITH RETROGRADE PYELOGRAM;  Surgeon: [name], MD;  Location: [facility];  Service: Urology;  Laterality: Bilateral;  . CYSTOSCOPY W/ URETERAL STENT PLACEMENT Right  08/20/2015   Procedure: CYSTOSCOPY WITH RETROGRADE PYELOGRAM/POSSIBLE URETERAL STENT PLACEMENT/BLADDER BIOPSY;  Surgeon: [name], MD;  Location: [facility];  Service: Urology;  Laterality: Right;  . CYSTOSCOPY W/ URETERAL STENT PLACEMENT Right 09/12/2015   Procedure: CYSTOSCOPY WITH STENT REPLACEMENT;  Surgeon: [name], MD;  Location: [facility];  Service: Urology;  Laterality: Right;  . CYSTOSCOPY WITH BIOPSY Right 09/12/2015   Procedure: CYSTOSCOPY WITH BLADDER AND URETERAL BIOPSY;  Surgeon: [name], MD;  Location: [facility];  Service: Urology;  Laterality: Right;  . CYSTOSCOPY WITH STENT PLACEMENT Right 01/30/2015   Procedure: CYSTOSCOPY WITH STENT PLACEMENT;  Surgeon: [name], MD;  Location: [facility];  Service: Urology;  Laterality: Right;  . CYSTOSCOPY WITH STENT PLACEMENT Right 12/28/2017   Procedure: [place] WITH STENT Exchange;  Surgeon: [name], MD;  Location: [facility];  Service: Urology;  Laterality: Right;  . CYSTOSCOPY/URETEROSCOPY/HOLMIUM LASER/STENT PLACEMENT Right 08/19/2017   Procedure: CYSTOSCOPY/URETEROSCOPY/HOLMIUM LASER/STENT PLACEMENT;  Surgeon: [name], MD;  Location: [facility];  Service: Urology;  Laterality: Right;  . EYE SURGERY Bilateral    Cataract Extraction with IOL  . goiter removal    . HOLMIUM LASER APPLICATION N/A 1/49/7026   Procedure:  HOLMIUM LASER APPLICATION;  Surgeon: [name], MD;  Location: [facility];  Service: Urology;  Laterality: N/A;  . PORTA CATH INSERTION N/A 09/23/2017   Procedure: PORTA CATH INSERTION;  Surgeon: Algernon Huxley, MD;  Location: [place] [facility];  Service: Cardiovascular;  Laterality: N/A;  . SPERMATOCELECTOMY    . TONSILLECTOMY    . TRANSURETHRAL RESECTION OF BLADDER TUMOR N/A 02/26/2016   Procedure: TRANSURETHRAL  RESECTION OF BLADDER TUMOR (TURBT);  Surgeon: [name], MD;  Location: [facility];  Service: Urology;  Laterality: N/A;  . TRANSURETHRAL RESECTION OF BLADDER TUMOR WITH  MITOMYCIN-C N/A 09/12/2015   Procedure: TRANSURETHRAL RESECTION OF BLADDER TUMOR ;  Surgeon: [name], MD;  Location: [facility];  Service: Urology;  Laterality: N/A;  . TRANSURETHRAL RESECTION OF BLADDER TUMOR WITH MITOMYCIN-C N/A 03/24/2016   Procedure: TRANSURETHRAL RESECTION OF BLADDER TUMOR WITH MITOMYCIN-C  (SMALL);  Surgeon: [name], MD;  Location: [facility];  Service: Urology;  Laterality: N/A;  . URETERAL BIOPSY Right 08/19/2017   Procedure: Renal Mass BIOPSY;  Surgeon: [name], MD;  Location: [facility];  Service: Urology;  Laterality: Right;  . URETEROSCOPY Right 01/30/2015   Procedure: URETEROSCOPY/ WITH BIOPSY AND CYTOLOGY BRUSHING;  Surgeon: [name], MD;  Location: [facility];  Service: Urology;  Laterality: Right;  . URETEROSCOPY Right 08/20/2015   Procedure: URETEROSCOPY;  Surgeon: [name], MD;  Location: [facility];  Service: Urology;  Laterality: Right;  . URETEROSCOPY Right 09/12/2015   Procedure: URETEROSCOPY;  Surgeon: [name], MD;  Location: [facility];  Service: Urology;  Laterality: Right;  . URETEROSCOPY Right 02/26/2016   Procedure: URETEROSCOPY;  Surgeon: [name], MD;  Location: [facility];  Service: Urology;  Laterality: Right;    Social History   Socioeconomic History  . Marital status: Married    Spouse name: Not on file  . Number of children: Not on file  . Years of education: Not on file  . Highest education level: Not on file  Occupational History  . Not on file  Tobacco Use  . Smoking status: Never Smoker  . Smokeless tobacco: Never Used  Vaping Use  . Vaping Use: Never used  Substance and Sexual Activity  . Alcohol use: No    Alcohol/week: 0.0 standard drinks  . Drug use: No  . Sexual activity: Not Currently  Other Topics Concern  . Not on file  Social History Narrative  . Not on file   Social Determinants of Health   Financial Resource Strain: Not on file  Food Insecurity: Not on file  Transportation Needs:  Not on file  Physical Activity: Not on file  Stress: Not on file  Social Connections: Not on file  Intimate Partner Violence: Not on file    Family History  Problem Relation Age of Onset  . Hypertension Mother   . Hypertension Father   . Prostate cancer Brother   . Kidney disease Neg Hx   . Kidney cancer Neg Hx   . Bladder Cancer Neg Hx      Current Outpatient Medications:  .  amLODipine (NORVASC) 5 MG tablet, Take 5 mg by mouth daily. , Disp: , Rfl:  .  benazepril-hydrochlorthiazide (LOTENSIN HCT) 20-12.5 MG tablet, Take 1 tablet by mouth daily. , Disp: , Rfl:  .  diazepam (VALIUM) 5 MG tablet, TAKE 1 TABLET (5 MG TOTAL) BY MOUTH EVERY 12 (TWELVE) HOURS AS NEEDED FOR ANXIETY OR SLEEP, Disp: , Rfl:  .  fenofibrate micronized (LOFIBRA) 134 MG capsule, Take 134 mg by mouth daily before breakfast. , Disp: , Rfl:  .  hydrOXYzine (ATARAX/VISTARIL) 25 MG tablet, TAKE 1 TABLET BY MOUTH THREE TIMES A DAY AS NEEDED, Disp: 270 tablet, Rfl: 1 .  Multiple Vitamin (MULTIVITAMIN WITH MINERALS) TABS tablet, Take 1 tablet by mouth daily., Disp: , Rfl:  .  psyllium (METAMUCIL) 58.6 % packet, Take 1 packet by mouth daily., Disp: , Rfl:  .  tamsulosin (FLOMAX) 0.4 MG CAPS capsule, TAKE 1 CAPSULE BY MOUTH EVERY DAY, Disp: 90 capsule, Rfl: 3 .  triamcinolone cream (KENALOG) 0.1 %, Apply 1 application topically 2 (two) times daily., Disp: , Rfl:  .  acetaminophen (TYLENOL) 500 MG tablet, Take 1,000 mg by mouth daily as needed for moderate pain.  (Patient not taking: No sig reported), Disp: , Rfl:  .  camphor-menthol (SARNA) lotion, Apply 1 application topically as needed for itching. (Patient not taking: Reported on 08/03/2020), Disp: , Rfl:  .  HYDROcodone-acetaminophen (NORCO/VICODIN) 5-325 MG tablet, Take 1 tablet by mouth every 6 (six) hours as needed for severe pain. (Patient not taking: No sig reported), Disp: 30 tablet, Rfl: 0 .  methotrexate (RHEUMATREX) 2.5 MG tablet, Take 5 mg by mouth once a week.  Caution:Chemotherapy. Protect from light. (Patient not taking: Reported on 04/03/2020), Disp: , Rfl:  .  nystatin (MYCOSTATIN/NYSTOP) powder, Apply 1 application topically 3 (three) times daily. To under arms area that has rash, Disp: 30 g, Rfl: 0 No current facility-administered medications for this visit.  Facility-Administered Medications Ordered in Other Visits:  .  sodium chloride flush (NS) 0.9 % injection 10 mL, 10 mL, Intravenous, Once, Sindy Guadeloupe, MD .  sodium chloride flush (NS) 0.9 % injection 10 mL, 10 mL, Intravenous, PRN, Sindy Guadeloupe, MD  Physical exam:  Vitals:   08/03/20 1410  BP: (!) 157/73  Pulse: 68  Temp: 98.5 F (36.9 C)  TempSrc: Oral  SpO2: 100%  Weight: 202 lb 8 oz (91.9 kg)   Physical Exam HENT:     Head: Normocephalic and atraumatic.  Eyes:     Pupils: Pupils are equal, round, and reactive to light.  Cardiovascular:     Rate and Rhythm: Normal rate and regular rhythm.     Heart sounds: Normal heart sounds.  Pulmonary:     Effort: Pulmonary effort is normal.     Breath sounds: Normal breath sounds.  Abdominal:     General: Bowel sounds are normal.     Palpations: Abdomen is soft.  Musculoskeletal:     Cervical back: Normal range of motion.  Skin:    General: Skin is warm and dry.     Comments: Scattered erythematous maculopapular lesions over abdominal wall few in number  Neurological:     Mental Status: He is alert and oriented to person, place, and time.      CMP Latest Ref Rng & Units 08/01/2020  Glucose 70 - 99 mg/dL 98  BUN 8 - 23 mg/dL 20  Creatinine 0.61 - 1.24 mg/dL 1.09  Sodium 135 - 145 mmol/L 132(L)  Potassium 3.5 - 5.1 mmol/L 3.9  Chloride 98 - 111 mmol/L 97(L)  CO2 22 - 32 mmol/L 26  Calcium 8.9 - 10.3 mg/dL 8.6(L)  Total Protein 6.5 - 8.1 g/dL 6.6  Total Bilirubin 0.3 - 1.2 mg/dL 0.8  Alkaline Phos 38 - 126 U/L 44  AST 15 - 41 U/L 30  ALT 0 - 44 U/L 19   CBC Latest Ref Rng & Units 08/01/2020  WBC 4.0 - 10.5 K/uL 5.6   Hemoglobin 13.0 - 17.0 g/dL 14.0  Hematocrit 39.0 - 52.0 % 40.9  Platelets 150 - 400 K/uL 240    No images are attached to the encounter.  CT CHEST ABDOMEN PELVIS W CONTRAST  Result Date: 08/02/2020 CLINICAL DATA:  Right ureteral and bladder cancer restaging, chemotherapy completed EXAM: CT CHEST, ABDOMEN, AND PELVIS WITH CONTRAST TECHNIQUE: Multidetector CT imaging of  the chest, abdomen and pelvis was performed following the standard protocol during bolus administration of intravenous contrast. CONTRAST:  128mL OMNIPAQUE IOHEXOL 300 MG/ML SOLN, additional oral enteric contrast COMPARISON:  03/07/2020 FINDINGS: CT CHEST FINDINGS Cardiovascular: Right chest port catheter. Aortic atherosclerosis. Normal heart size. Left coronary artery calcifications. No pericardial effusion. Mediastinum/Nodes: No enlarged mediastinal, hilar, or axillary lymph nodes. Thyroid gland, trachea, and esophagus demonstrate no significant findings. Lungs/Pleura: Unchanged lobulated nodule of the medial right upper lobe measuring 1.2 x 1.0 cm (series 3, image 78). Unchanged 3 mm nodule of the anterior right upper lobe (series 3, image 75). No pleural effusion or pneumothorax. Musculoskeletal: No suspicious bone lesions identified. Bilateral gynecomastia. CT ABDOMEN PELVIS FINDINGS Hepatobiliary: No solid liver abnormality is seen. Small gallstones in the gallbladder. No gallbladder wall thickening, or biliary dilatation. Pancreas: Unchanged cystic lesion of the anterior pancreatic head and uncinate measuring 1.3 cm (series 2, image 81). No pancreatic ductal dilatation or surrounding inflammatory changes. Spleen: Normal in size without significant abnormality. Adrenals/Urinary Tract: Adrenal glands are unremarkable. Kidneys are normal, without renal calculi, solid lesion, or hydronephrosis. Small diverticulum of the posterior left aspect of the urinary bladder (series 2, image 122). Stomach/Bowel: Stomach is within normal limits.  Appendix is not clearly visualized. No evidence of bowel wall thickening, distention, or inflammatory changes. Sigmoid diverticulosis. Vascular/Lymphatic: Aortic atherosclerosis. No enlarged abdominal or pelvic lymph nodes. Reproductive: Mild prostatomegaly. Other: No abdominal wall hernia or abnormality. No abdominopelvic ascites. Musculoskeletal: No acute or significant osseous findings. IMPRESSION: 1. Unchanged lobulated nodule of the medial right upper lobe measuring 1.2 x 1.0 cm. Unchanged 3 mm nodule of the anterior right upper lobe. Attention on follow-up. No definite evidence of pulmonary metastatic disease. 2. No evidence of recurrent or metastatic disease within the abdomen or pelvis. 3. Unchanged cystic lesion of the anterior pancreatic head and uncinate measuring 1.3 cm, likely a small IPMN. Attention on follow-up. No specific follow-up or characterization is required for this lesion measuring smaller than 2 cm. 4. Cholelithiasis. 5. Mild prostatomegaly. 6. Coronary artery disease. Aortic Atherosclerosis (ICD10-I70.0). Electronically Signed   By: Eddie Candle M.D.   On: 08/02/2020 14:16     Assessment and plan- Patient is a 87 y.o. male withmetastatic urothelial carcinoma with metastases to the lymph nodes.   He is here to discuss CT scan results and further management  CT scans continue to show excellent response with no evidence of recurrent or progressive disease.  Only site of active disease noted was right upper lobe lung nodule which was 1.2 x 1 cm but has remained unchanged over the last 1 year.  Pancreatic cyst unchanged.  Upper urothelial lesion and obturator adenopathy not visualized.  My plan is to continue to monitor him off treatment at this time and consider restarting treatment at progression.  I will see him back in 6 5 months with a CT abdomen and pelvis with contrast and labs   Visit Diagnosis 1. 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 Randa Evens, MD,  [facility] at [facility] [phone] 08/04/2020 8:31 AM

## 2020-08-21 DIAGNOSIS — R739 Hyperglycemia, unspecified: Secondary | ICD-10-CM | POA: Diagnosis not present

## 2020-08-21 DIAGNOSIS — E7800 Pure hypercholesterolemia, unspecified: Secondary | ICD-10-CM | POA: Diagnosis not present

## 2020-08-21 DIAGNOSIS — Z79899 Other long term (current) drug therapy: Secondary | ICD-10-CM | POA: Diagnosis not present

## 2020-08-21 DIAGNOSIS — T466X5A Adverse effect of antihyperlipidemic and antiarteriosclerotic drugs, initial encounter: Secondary | ICD-10-CM | POA: Diagnosis not present

## 2020-08-21 DIAGNOSIS — I10 Essential (primary) hypertension: Secondary | ICD-10-CM | POA: Diagnosis not present

## 2020-08-21 DIAGNOSIS — C649 Malignant neoplasm of unspecified kidney, except renal pelvis: Secondary | ICD-10-CM | POA: Diagnosis not present

## 2020-08-21 DIAGNOSIS — G720 Drug-induced myopathy: Secondary | ICD-10-CM | POA: Diagnosis not present

## 2020-09-27 DIAGNOSIS — U071 COVID-19: Secondary | ICD-10-CM | POA: Diagnosis not present

## 2020-09-27 DIAGNOSIS — C649 Malignant neoplasm of unspecified kidney, except renal pelvis: Secondary | ICD-10-CM | POA: Diagnosis not present

## 2020-09-27 DIAGNOSIS — I10 Essential (primary) hypertension: Secondary | ICD-10-CM | POA: Diagnosis not present

## 2020-09-27 DIAGNOSIS — R509 Fever, unspecified: Secondary | ICD-10-CM | POA: Diagnosis not present

## 2020-10-01 ENCOUNTER — Telehealth: Payer: Self-pay | Admitting: *Deleted

## 2020-10-01 NOTE — Telephone Encounter (Signed)
Would just monitor for now. If he has any symptoms of UTI dysuria, frequency, burning etc he can drop off a sample for testing

## 2020-10-01 NOTE — Telephone Encounter (Signed)
Patient informed of doctor response, and he states he is not having any symptoms of UTI, bit will let us know if he does. He thanked me for calling back

## 2020-10-01 NOTE — Telephone Encounter (Signed)
Patient called reporting that he is getting over [place] and that his right kidney that he had cancer in is hurting when he coughs, bends over, or puts any type of strain on it. He is asking if anything needs to be done ir if it is just like a pulled muscle type of thing. He states that his urine is "clear as a bell" when asked if he is passing any blood. Please advise

## 2020-10-05 DIAGNOSIS — J019 Acute sinusitis, unspecified: Secondary | ICD-10-CM | POA: Diagnosis not present

## 2020-10-05 DIAGNOSIS — J439 Emphysema, unspecified: Secondary | ICD-10-CM | POA: Diagnosis not present

## 2020-10-05 DIAGNOSIS — M545 Low back pain, unspecified: Secondary | ICD-10-CM | POA: Diagnosis not present

## 2020-10-05 DIAGNOSIS — J40 Bronchitis, not specified as acute or chronic: Secondary | ICD-10-CM | POA: Diagnosis not present

## 2020-10-05 DIAGNOSIS — U071 COVID-19: Secondary | ICD-10-CM | POA: Diagnosis not present

## 2020-10-31 DIAGNOSIS — L821 Other seborrheic keratosis: Secondary | ICD-10-CM | POA: Diagnosis not present

## 2020-10-31 DIAGNOSIS — L570 Actinic keratosis: Secondary | ICD-10-CM | POA: Diagnosis not present

## 2020-10-31 DIAGNOSIS — D485 Neoplasm of uncertain behavior of skin: Secondary | ICD-10-CM | POA: Diagnosis not present

## 2020-10-31 DIAGNOSIS — C4442 Squamous cell carcinoma of skin of scalp and neck: Secondary | ICD-10-CM | POA: Diagnosis not present

## 2020-10-31 DIAGNOSIS — D225 Melanocytic nevi of trunk: Secondary | ICD-10-CM | POA: Diagnosis not present

## 2020-10-31 DIAGNOSIS — X32XXXA Exposure to sunlight, initial encounter: Secondary | ICD-10-CM | POA: Diagnosis not present

## 2020-10-31 DIAGNOSIS — D2272 Melanocytic nevi of left lower limb, including hip: Secondary | ICD-10-CM | POA: Diagnosis not present

## 2020-10-31 DIAGNOSIS — D2261 Melanocytic nevi of right upper limb, including shoulder: Secondary | ICD-10-CM | POA: Diagnosis not present

## 2020-10-31 DIAGNOSIS — C44311 Basal cell carcinoma of skin of nose: Secondary | ICD-10-CM | POA: Diagnosis not present

## 2020-10-31 DIAGNOSIS — Z85828 Personal history of other malignant neoplasm of skin: Secondary | ICD-10-CM | POA: Diagnosis not present

## 2020-11-19 DIAGNOSIS — I10 Essential (primary) hypertension: Secondary | ICD-10-CM | POA: Diagnosis not present

## 2020-11-19 DIAGNOSIS — Z79899 Other long term (current) drug therapy: Secondary | ICD-10-CM | POA: Diagnosis not present

## 2020-11-19 DIAGNOSIS — E7800 Pure hypercholesterolemia, unspecified: Secondary | ICD-10-CM | POA: Diagnosis not present

## 2020-11-19 DIAGNOSIS — R739 Hyperglycemia, unspecified: Secondary | ICD-10-CM | POA: Diagnosis not present

## 2020-11-26 DIAGNOSIS — G720 Drug-induced myopathy: Secondary | ICD-10-CM | POA: Diagnosis not present

## 2020-11-26 DIAGNOSIS — R739 Hyperglycemia, unspecified: Secondary | ICD-10-CM | POA: Diagnosis not present

## 2020-11-26 DIAGNOSIS — C649 Malignant neoplasm of unspecified kidney, except renal pelvis: Secondary | ICD-10-CM | POA: Diagnosis not present

## 2020-11-26 DIAGNOSIS — N183 Chronic kidney disease, stage 3 unspecified: Secondary | ICD-10-CM | POA: Diagnosis not present

## 2020-11-26 DIAGNOSIS — I10 Essential (primary) hypertension: Secondary | ICD-10-CM | POA: Diagnosis not present

## 2020-11-26 DIAGNOSIS — Z23 Encounter for immunization: Secondary | ICD-10-CM | POA: Diagnosis not present

## 2020-11-26 DIAGNOSIS — T466X5A Adverse effect of antihyperlipidemic and antiarteriosclerotic drugs, initial encounter: Secondary | ICD-10-CM | POA: Diagnosis not present

## 2020-11-26 DIAGNOSIS — E7800 Pure hypercholesterolemia, unspecified: Secondary | ICD-10-CM | POA: Diagnosis not present

## 2020-11-26 DIAGNOSIS — Z0000 Encounter for general adult medical examination without abnormal findings: Secondary | ICD-10-CM | POA: Diagnosis not present

## 2020-12-11 DIAGNOSIS — C4442 Squamous cell carcinoma of skin of scalp and neck: Secondary | ICD-10-CM | POA: Diagnosis not present

## 2020-12-11 DIAGNOSIS — D044 Carcinoma in situ of skin of scalp and neck: Secondary | ICD-10-CM | POA: Diagnosis not present

## 2020-12-11 DIAGNOSIS — C44311 Basal cell carcinoma of skin of nose: Secondary | ICD-10-CM | POA: Diagnosis not present

## 2020-12-18 DIAGNOSIS — C4442 Squamous cell carcinoma of skin of scalp and neck: Secondary | ICD-10-CM | POA: Diagnosis not present

## 2020-12-18 DIAGNOSIS — D044 Carcinoma in situ of skin of scalp and neck: Secondary | ICD-10-CM | POA: Diagnosis not present

## 2020-12-25 ENCOUNTER — Encounter: Payer: Self-pay | Admitting: Oncology

## 2021-01-03 ENCOUNTER — Other Ambulatory Visit: Payer: Self-pay

## 2021-01-03 ENCOUNTER — Inpatient Hospital Stay: Payer: PPO | Attending: Oncology

## 2021-01-03 ENCOUNTER — Ambulatory Visit
Admission: RE | Admit: 2021-01-03 | Discharge: 2021-01-03 | Disposition: A | Discharge status: Home / Self Care | Payer: PPO | Source: Ambulatory Visit | Attending: Oncology | Admitting: Oncology

## 2021-01-03 DIAGNOSIS — L309 Dermatitis, unspecified: Secondary | ICD-10-CM | POA: Diagnosis not present

## 2021-01-03 DIAGNOSIS — I700 Atherosclerosis of aorta: Secondary | ICD-10-CM | POA: Insufficient documentation

## 2021-01-03 DIAGNOSIS — R5383 Other fatigue: Secondary | ICD-10-CM | POA: Insufficient documentation

## 2021-01-03 DIAGNOSIS — Z8042 Family history of malignant neoplasm of prostate: Secondary | ICD-10-CM | POA: Diagnosis not present

## 2021-01-03 DIAGNOSIS — Z8249 Family history of ischemic heart disease and other diseases of the circulatory system: Secondary | ICD-10-CM | POA: Diagnosis not present

## 2021-01-03 DIAGNOSIS — K8020 Calculus of gallbladder without cholecystitis without obstruction: Secondary | ICD-10-CM | POA: Insufficient documentation

## 2021-01-03 DIAGNOSIS — E785 Hyperlipidemia, unspecified: Secondary | ICD-10-CM | POA: Diagnosis not present

## 2021-01-03 DIAGNOSIS — I10 Essential (primary) hypertension: Secondary | ICD-10-CM | POA: Diagnosis not present

## 2021-01-03 DIAGNOSIS — Z87440 Personal history of urinary (tract) infections: Secondary | ICD-10-CM | POA: Insufficient documentation

## 2021-01-03 DIAGNOSIS — Z885 Allergy status to narcotic agent status: Secondary | ICD-10-CM | POA: Diagnosis not present

## 2021-01-03 DIAGNOSIS — Z87442 Personal history of urinary calculi: Secondary | ICD-10-CM | POA: Insufficient documentation

## 2021-01-03 DIAGNOSIS — Z882 Allergy status to sulfonamides status: Secondary | ICD-10-CM | POA: Diagnosis not present

## 2021-01-03 DIAGNOSIS — I2510 Atherosclerotic heart disease of native coronary artery without angina pectoris: Secondary | ICD-10-CM | POA: Diagnosis not present

## 2021-01-03 DIAGNOSIS — R911 Solitary pulmonary nodule: Secondary | ICD-10-CM | POA: Diagnosis not present

## 2021-01-03 DIAGNOSIS — C679 Malignant neoplasm of bladder, unspecified: Secondary | ICD-10-CM | POA: Insufficient documentation

## 2021-01-03 DIAGNOSIS — C669 Malignant neoplasm of unspecified ureter: Secondary | ICD-10-CM

## 2021-01-03 DIAGNOSIS — R590 Localized enlarged lymph nodes: Secondary | ICD-10-CM | POA: Diagnosis not present

## 2021-01-03 DIAGNOSIS — Z85828 Personal history of other malignant neoplasm of skin: Secondary | ICD-10-CM | POA: Diagnosis not present

## 2021-01-03 DIAGNOSIS — Z95828 Presence of other vascular implants and grafts: Secondary | ICD-10-CM

## 2021-01-03 DIAGNOSIS — Z79899 Other long term (current) drug therapy: Secondary | ICD-10-CM | POA: Diagnosis not present

## 2021-01-03 DIAGNOSIS — C661 Malignant neoplasm of right ureter: Secondary | ICD-10-CM | POA: Diagnosis not present

## 2021-01-03 DIAGNOSIS — K862 Cyst of pancreas: Secondary | ICD-10-CM | POA: Diagnosis not present

## 2021-01-03 LAB — COMPREHENSIVE METABOLIC PANEL
ALT: 19 U/L (ref 0–44)
AST: 32 U/L (ref 15–41)
Albumin: 3.7 g/dL (ref 3.5–5.0)
Alkaline Phosphatase: 54 U/L (ref 38–126)
Anion gap: 7 (ref 5–15)
BUN: 16 mg/dL (ref 8–23)
CO2: 27 mmol/L (ref 22–32)
Calcium: 8.7 mg/dL — ABNORMAL LOW (ref 8.9–10.3)
Chloride: 98 mmol/L (ref 98–111)
Creatinine, Ser: 0.97 mg/dL (ref 0.61–1.24)
GFR, Estimated: >60 mL/min (ref >60)
Glucose, Bld: 141 mg/dL — ABNORMAL HIGH (ref 70–99)
Potassium: 3.8 mmol/L (ref 3.5–5.1)
Sodium: 132 mmol/L — ABNORMAL LOW (ref 135–145)
Total Bilirubin: 0.7 mg/dL (ref 0.3–1.2)
Total Protein: 6.7 g/dL (ref 6.5–8.1)

## 2021-01-03 LAB — CBC WITH DIFFERENTIAL/PLATELET
Abs Immature Granulocytes: 0.03 10*3/uL (ref 0.00–0.07)
Basophils Absolute: 0.1 10*3/uL (ref 0.0–0.1)
Basophils Relative: 1 %
Eosinophils Absolute: 0.2 10*3/uL (ref 0.0–0.5)
Eosinophils Relative: 4 %
HCT: 40.4 % (ref 39.0–52.0)
Hemoglobin: 14 g/dL (ref 13.0–17.0)
Immature Granulocytes: 1 %
Lymphocytes Relative: 26 %
Lymphs Abs: 1.7 10*3/uL (ref 0.7–4.0)
MCH: 30.4 pg (ref 26.0–34.0)
MCHC: 34.7 g/dL (ref 30.0–36.0)
MCV: 87.6 fL (ref 80.0–100.0)
Monocytes Absolute: 0.6 10*3/uL (ref 0.1–1.0)
Monocytes Relative: 9 %
Neutro Abs: 4 10*3/uL (ref 1.7–7.7)
Neutrophils Relative %: 59 %
Platelets: 284 10*3/uL (ref 150–400)
RBC: 4.61 MIL/uL (ref 4.22–5.81)
RDW: 13 % (ref 11.5–15.5)
WBC: 6.6 10*3/uL (ref 4.0–10.5)
nRBC: 0 % (ref 0.0–0.2)

## 2021-01-03 MED ORDER — SODIUM CHLORIDE 0.9% FLUSH
10.0000 mL | Freq: Once | INTRAVENOUS | Status: AC
  Administered 2021-01-03: 10 mL via INTRAVENOUS
  Filled 2021-01-03: qty 10

## 2021-01-03 MED ORDER — HEPARIN SOD (PORK) LOCK FLUSH 100 UNIT/ML IV SOLN
INTRAVENOUS | Status: AC
  Administered 2021-01-03: 500 [IU] via INTRAVENOUS
  Filled 2021-01-03: qty 5

## 2021-01-03 MED ORDER — IOHEXOL 300 MG/ML  SOLN
100.0000 mL | Freq: Once | INTRAMUSCULAR | Status: AC | PRN
  Administered 2021-01-03: 100 mL via INTRAVENOUS

## 2021-01-03 MED ORDER — HEPARIN SOD (PORK) LOCK FLUSH 100 UNIT/ML IV SOLN
500.0000 [IU] | Freq: Once | INTRAVENOUS | Status: AC
  Filled 2021-01-03: qty 5

## 2021-01-04 ENCOUNTER — Encounter: Payer: Self-pay | Admitting: Oncology

## 2021-01-04 ENCOUNTER — Other Ambulatory Visit: Payer: PPO

## 2021-01-04 ENCOUNTER — Inpatient Hospital Stay: Payer: PPO | Admitting: Oncology

## 2021-01-04 VITALS — BP 149/88 | HR 66 | Temp 97.0°F | Resp 16 | Wt 196.0 lb

## 2021-01-04 DIAGNOSIS — C669 Malignant neoplasm of unspecified ureter: Secondary | ICD-10-CM

## 2021-01-04 DIAGNOSIS — C679 Malignant neoplasm of bladder, unspecified: Secondary | ICD-10-CM | POA: Diagnosis not present

## 2021-01-04 NOTE — Progress Notes (Signed)
Patient here for follow up with CT Scan results today. Patient denies having any pain or new complaints at this time. He states that he did fall in his bathroom about 3 weeks ago, when he slipped and was caught in an unusual position, wedged between the toilet and the bathtub. He was able to get up with the help of his wife; no injuries were noted at the time.

## 2021-01-04 NOTE — Progress Notes (Signed)
Hematology/Oncology Consult note [facility]  Telephone:([phone] [phone]  Patient Care Team: Idelle Crouch, MD as PCP - General (Internal Medicine) Sindy Guadeloupe, MD as Consulting Physician (Hematology and Oncology) Sindy Guadeloupe, MD as Consulting Physician (Hematology and Oncology) Sindy Guadeloupe, MD as Consulting Physician (Hematology and Oncology)   Name of the patient: Anthony Gonzales  [id number]  [birth year]   Date of visit: 01/04/21  Diagnosis- Metastatic urothelial carcinoma with possible mets to the obturator node. Indeterminate hilar lymph nodes and LUL lung nodule currently in remission  Chief complaint/ Reason for visit-discuss CT scan results and further management  Heme/Onc history: patient is a 87 year old male with past medical history significant for hypertension and long-standing history of superficial bladder cancer for which he sees [name].  He has undergone TURBT as well as ureteroscopy since 2017 along with mitomycin as well in the past.  Most recently he underwent CT abdomen on 08/06/2017 which showed a soft tissue mass in the right renal pelvis concerning for upper tract urothelial neoplasm.  Soft tissue fullness at the right ureterovesical junction with proximal right hydroureteronephrosis.  Several millimeter attenuation lesion in the pancreatic head.   He underwent diagnostic ureteroscopy and was found to have 2 high-grade lesions within his right kidney.  There was a nodular high-grade appearing lesion in the right anterior renal pelvis.  There was also a second ureteral tumor fungating from the right ureteral orifice extending into the distal ureter also consistent with high-grade invasive urothelial carcinoma.  Muscle invasion could not be assessed.  He also underwent a CT chest which showed a rounded nodule in the right upper lobe measuring 14 mm concerning for metastases.  2 other 3 mm lesions were also noted  in the left upper lobe likely benign   Plan initially was neoadjuvant chemotherapy followed by possible surgery but given the presence of lung lesion patient has been referred to oncology for the same.   PET/CT on 09/21/17 showed: IMPRESSION: 1. Hypermetabolic right obturator lymph node is most indicative of metastatic disease. 2. Mildly hypermetabolic left hilar lymph nodes are nonspecific. Continued attention on follow-up exams is warranted. 3. Right upper lobe pulmonary nodule shows metabolism after just above blood pool and is therefore indeterminate. Continued attention on follow-up exams is warranted. 4. Aortic atherosclerosis (ICD10-170.0). Coronary artery calcification. 5. Cholelithiasis    Carboplatin/ gemzar 1 week on and one-week off as patient could not tolerate 2-week on and one week off regimen.  Cycle 1 started on 09/22/2017 Disease progression in March 2020.  Switched to second [employer]   Patient had disease controlled with Keytruda.  However he was noted to have worsening dermatitis with constant flareups requiring steroids.  Beryle Flock is therefore being kept on hold and patient will be switched to third line Padcev.    Treatment has been on hold since February 2021 due to constant flareup of skin rash    Interval history-patient is doing well for his age.  He remains active and independent of his ADLs and IADLs.  Denies any specific complaints at this time and than mild fatigue.  ECOG PS- 1 Pain scale- 0   Review of systems- Review of Systems  Constitutional:  Positive for malaise/fatigue. Negative for chills, fever and weight loss.  HENT:  Negative for congestion, ear discharge and nosebleeds.   Eyes:  Negative for blurred vision.  Respiratory:  Negative for cough, hemoptysis, sputum production, shortness of breath and wheezing.  Cardiovascular:  Negative for chest pain, palpitations, orthopnea and claudication.  Gastrointestinal:  Negative for abdominal pain,  blood in stool, constipation, diarrhea, heartburn, melena, nausea and vomiting.  Genitourinary:  Negative for dysuria, flank pain, frequency, hematuria and urgency.  Musculoskeletal:  Negative for back pain, joint pain and myalgias.  Skin:  Negative for rash.  Neurological:  Negative for dizziness, tingling, focal weakness, seizures, weakness and headaches.  Endo/Heme/Allergies:  Does not bruise/bleed easily.  Psychiatric/Behavioral:  Negative for depression and suicidal ideas. The patient does not have insomnia.     Allergies  Allergen Reactions   Demerol [Meperidine] Nausea And Vomiting   Lipitor [Atorvastatin] Swelling   Sulfa Antibiotics Nausea And Vomiting and Rash     Past Medical History:  Diagnosis Date   Arthritis    Benign fibroma of prostate 08/23/2013   BPH (benign prostatic hyperplasia)    Calculus of kidney 08/23/2013   Glaucoma    no drops in 3 mo pressure good, pt denies glaucoma, eye pressure has been measuring alright.   History of kidney stones    HLD (hyperlipidemia)    HOH (hard of hearing)    Left Hearing Aid   HTN (hypertension) 12/26/2014   Hypertension    Hyponatremia 12/26/2014   Migraines    history of migraines when he was younger.   Restless leg syndrome    Sinus drainage    Skin cancer    Skin cancer    left hand 06/2019   Urothelial cancer ([place])    chemo tx's.   UTI (lower urinary tract infection) 12/26/2014   Vertigo      Past Surgical History:  Procedure Laterality Date   COLONOSCOPY     CYSTOSCOPY W/ RETROGRADES Right 01/30/2015   Procedure: CYSTOSCOPY WITH RETROGRADE PYELOGRAM;  Surgeon: [name], MD;  Location: [facility];  Service: Urology;  Laterality: Right;   CYSTOSCOPY W/ RETROGRADES Bilateral 02/26/2016   Procedure: CYSTOSCOPY WITH RETROGRADE PYELOGRAM;  Surgeon: [name], MD;  Location: [facility];  Service: Urology;  Laterality: Bilateral;   CYSTOSCOPY W/ URETERAL STENT PLACEMENT Right 08/20/2015   Procedure:  CYSTOSCOPY WITH RETROGRADE PYELOGRAM/POSSIBLE URETERAL STENT PLACEMENT/BLADDER BIOPSY;  Surgeon: [name], MD;  Location: [facility];  Service: Urology;  Laterality: Right;   CYSTOSCOPY W/ URETERAL STENT PLACEMENT Right 09/12/2015   Procedure: CYSTOSCOPY WITH STENT REPLACEMENT;  Surgeon: [name], MD;  Location: [facility];  Service: Urology;  Laterality: Right;   CYSTOSCOPY WITH BIOPSY Right 09/12/2015   Procedure: CYSTOSCOPY WITH BLADDER AND URETERAL BIOPSY;  Surgeon: [name], MD;  Location: [facility];  Service: Urology;  Laterality: Right;   CYSTOSCOPY WITH STENT PLACEMENT Right 01/30/2015   Procedure: CYSTOSCOPY WITH STENT PLACEMENT;  Surgeon: [name], MD;  Location: [facility];  Service: Urology;  Laterality: Right;   CYSTOSCOPY WITH STENT PLACEMENT Right 12/28/2017   Procedure: CYSTOSCOPY WITH STENT Exchange;  Surgeon: [name], MD;  Location: [facility];  Service: Urology;  Laterality: Right;   CYSTOSCOPY/URETEROSCOPY/HOLMIUM LASER/STENT PLACEMENT Right 08/19/2017   Procedure: CYSTOSCOPY/URETEROSCOPY/HOLMIUM LASER/STENT PLACEMENT;  Surgeon: [name], MD;  Location: [facility];  Service: Urology;  Laterality: Right;   EYE SURGERY Bilateral    Cataract Extraction with IOL   goiter removal     HOLMIUM LASER APPLICATION N/A 10/26/2991   Procedure:  HOLMIUM LASER APPLICATION;  Surgeon: [name], MD;  Location: [facility];  Service: Urology;  Laterality: N/A;   PORTA CATH INSERTION N/A 09/23/2017   Procedure: PORTA CATH INSERTION;  Surgeon: Algernon Huxley,  MD;  Location: [facility];  Service: Cardiovascular;  Laterality: N/A;   SPERMATOCELECTOMY     TONSILLECTOMY     TRANSURETHRAL RESECTION OF BLADDER TUMOR N/A 02/26/2016   Procedure: TRANSURETHRAL RESECTION OF BLADDER TUMOR (TURBT);  Surgeon: [name], MD;  Location: [facility];  Service: Urology;  Laterality: N/A;   TRANSURETHRAL RESECTION OF BLADDER TUMOR WITH MITOMYCIN-C N/A 09/12/2015   Procedure:  TRANSURETHRAL RESECTION OF BLADDER TUMOR ;  Surgeon: [name], MD;  Location: [facility];  Service: Urology;  Laterality: N/A;   TRANSURETHRAL RESECTION OF BLADDER TUMOR WITH MITOMYCIN-C N/A 03/24/2016   Procedure: TRANSURETHRAL RESECTION OF BLADDER TUMOR WITH MITOMYCIN-C  (SMALL);  Surgeon: [name], MD;  Location: [facility];  Service: Urology;  Laterality: N/A;   URETERAL BIOPSY Right 08/19/2017   Procedure: Renal Mass BIOPSY;  Surgeon: [name], MD;  Location: [facility];  Service: Urology;  Laterality: Right;   URETEROSCOPY Right 01/30/2015   Procedure: URETEROSCOPY/ WITH BIOPSY AND CYTOLOGY BRUSHING;  Surgeon: [name], MD;  Location: [facility];  Service: Urology;  Laterality: Right;   URETEROSCOPY Right 08/20/2015   Procedure: URETEROSCOPY;  Surgeon: [name], MD;  Location: [facility];  Service: Urology;  Laterality: Right;   URETEROSCOPY Right 09/12/2015   Procedure: URETEROSCOPY;  Surgeon: [name], MD;  Location: [facility];  Service: Urology;  Laterality: Right;   URETEROSCOPY Right 02/26/2016   Procedure: URETEROSCOPY;  Surgeon: [name], MD;  Location: [facility];  Service: Urology;  Laterality: Right;    Social History   Socioeconomic History   Marital status: Married    Spouse name: Not on file   Number of children: Not on file   Years of education: Not on file   Highest education level: Not on file  Occupational History   Not on file  Tobacco Use   Smoking status: Never   Smokeless tobacco: Never  Vaping Use   Vaping Use: Never used  Substance and Sexual Activity   Alcohol use: No    Alcohol/week: 0.0 standard drinks   Drug use: No   Sexual activity: Not Currently  Other Topics Concern   Not on file  Social History Narrative   Not on file   Social Determinants of Health   Financial Resource Strain: Not on file  Food Insecurity: Not on file  Transportation Needs: Not on file  Physical Activity: Not on file  Stress: Not on file   Social Connections: Not on file  Intimate Partner Violence: Not on file    Family History  Problem Relation Age of Onset   Hypertension Mother    Hypertension Father    Prostate cancer Brother    Kidney disease Neg Hx    Kidney cancer Neg Hx    Bladder Cancer Neg Hx      Current Outpatient Medications:    acetaminophen (TYLENOL) 500 MG tablet, Take 1,000 mg by mouth daily as needed for moderate pain., Disp: , Rfl:    amLODipine (NORVASC) 5 MG tablet, Take 5 mg by mouth daily. , Disp: , Rfl:    benazepril-hydrochlorthiazide (LOTENSIN HCT) 20-12.5 MG tablet, Take 1 tablet by mouth daily. , Disp: , Rfl:    diazepam (VALIUM) 5 MG tablet, TAKE 1 TABLET (5 MG TOTAL) BY MOUTH EVERY 12 (TWELVE) HOURS AS NEEDED FOR ANXIETY OR SLEEP, Disp: , Rfl:    fenofibrate micronized (LOFIBRA) 134 MG capsule, Take 134 mg by mouth daily before breakfast. , Disp: , Rfl:    hydrOXYzine (ATARAX/VISTARIL)  25 MG tablet, TAKE 1 TABLET BY MOUTH THREE TIMES A DAY AS NEEDED, Disp: 270 tablet, Rfl: 1   Multiple Vitamin (MULTIVITAMIN WITH MINERALS) TABS tablet, Take 1 tablet by mouth daily., Disp: , Rfl:    psyllium (METAMUCIL) 58.6 % packet, Take 1 packet by mouth daily., Disp: , Rfl:    tamsulosin (FLOMAX) 0.4 MG CAPS capsule, TAKE 1 CAPSULE BY MOUTH EVERY DAY, Disp: 90 capsule, Rfl: 3   triamcinolone cream (KENALOG) 0.1 %, Apply 1 application topically 2 (two) times daily., Disp: , Rfl:    camphor-menthol (SARNA) lotion, Apply 1 application topically as needed for itching. (Patient not taking: No sig reported), Disp: , Rfl:    HYDROcodone-acetaminophen (NORCO/VICODIN) 5-325 MG tablet, Take 1 tablet by mouth every 6 (six) hours as needed for severe pain. (Patient not taking: No sig reported), Disp: 30 tablet, Rfl: 0   nystatin (MYCOSTATIN/NYSTOP) powder, Apply 1 application topically 3 (three) times daily. To under arms area that has rash (Patient not taking: Reported on 01/04/2021), Disp: 30 g, Rfl: 0 No current  facility-administered medications for this visit.  Facility-Administered Medications Ordered in Other Visits:    sodium chloride flush (NS) 0.9 % injection 10 mL, 10 mL, Intravenous, Once, Sindy Guadeloupe, MD   sodium chloride flush (NS) 0.9 % injection 10 mL, 10 mL, Intravenous, PRN, Sindy Guadeloupe, MD  Physical exam:  Vitals:   01/04/21 1301 01/04/21 1307  BP:  (!) 149/88  Pulse:  66  Resp:  16  Temp:  (!) 97 F (36.1 C)  TempSrc:  Tympanic  SpO2:  98%  Weight: 196 lb (88.9 kg)    Physical Exam Constitutional:      General: He is not in acute distress. Cardiovascular:     Rate and Rhythm: Normal rate and regular rhythm.     Heart sounds: Normal heart sounds.  Pulmonary:     Effort: Pulmonary effort is normal.     Breath sounds: Normal breath sounds.  Abdominal:     General: Bowel sounds are normal.     Palpations: Abdomen is soft.  Skin:    General: Skin is warm and dry.  Neurological:     Mental Status: He is alert and oriented to person, place, and time.     CMP Latest Ref Rng & Units 01/03/2021  Glucose 70 - 99 mg/dL 141(H)  BUN 8 - 23 mg/dL 16  Creatinine 0.61 - 1.24 mg/dL 0.97  Sodium 135 - 145 mmol/L 132(L)  Potassium 3.5 - 5.1 mmol/L 3.8  Chloride 98 - 111 mmol/L 98  CO2 22 - 32 mmol/L 27  Calcium 8.9 - 10.3 mg/dL 8.7(L)  Total Protein 6.5 - 8.1 g/dL 6.7  Total Bilirubin 0.3 - 1.2 mg/dL 0.7  Alkaline Phos 38 - 126 U/L 54  AST 15 - 41 U/L 32  ALT 0 - 44 U/L 19   CBC Latest Ref Rng & Units 01/03/2021  WBC 4.0 - 10.5 K/uL 6.6  Hemoglobin 13.0 - 17.0 g/dL 14.0  Hematocrit 39.0 - 52.0 % 40.4  Platelets 150 - 400 K/uL 284     Assessment and plan- Patient is a 87 y.o. male with stage IV urothelial carcinoma with lymph node and possible lung metastases currently off all treatment since February 2021 here to discussCT scan results and further management.    I have reviewed CT chest abdomen and pelvis images independently and discussed findings with the  patient.  Official radiology report is pending but I do not  see any overt evidence of disease progression at this time.  Right upper lobe lung nodule overall appears stable.  We will call him after the results of CT scan are back.  At this time plan is to observe him off all treatment and consider treatment and progression.  He is doing very well after treatment for the last 18 months with excellent quality of life for his age.  He still has a chest wall port and placed which will be flushed every 3 months and I will see him back in 6 months with labs and CT scans prior   Visit Diagnosis 1. 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 Randa Evens, MD, MPH [facility] [phone] 01/04/2021 3:04 PM

## 2021-01-08 ENCOUNTER — Other Ambulatory Visit: Payer: Self-pay | Admitting: *Deleted

## 2021-01-08 DIAGNOSIS — C669 Malignant neoplasm of unspecified ureter: Secondary | ICD-10-CM

## 2021-01-11 ENCOUNTER — Other Ambulatory Visit: Payer: Self-pay | Admitting: Oncology

## 2021-01-14 ENCOUNTER — Inpatient Hospital Stay: Payer: PPO | Admitting: Oncology

## 2021-01-14 ENCOUNTER — Other Ambulatory Visit: Payer: Self-pay

## 2021-01-14 ENCOUNTER — Inpatient Hospital Stay: Payer: PPO

## 2021-01-14 ENCOUNTER — Inpatient Hospital Stay: Payer: PPO | Attending: Oncology

## 2021-01-14 ENCOUNTER — Other Ambulatory Visit: Payer: Self-pay | Admitting: Urology

## 2021-01-14 VITALS — BP 136/67 | HR 80 | Temp 97.8°F | Resp 16 | Wt 194.4 lb

## 2021-01-14 DIAGNOSIS — K5900 Constipation, unspecified: Secondary | ICD-10-CM | POA: Diagnosis not present

## 2021-01-14 DIAGNOSIS — Z87442 Personal history of urinary calculi: Secondary | ICD-10-CM | POA: Diagnosis not present

## 2021-01-14 DIAGNOSIS — N400 Enlarged prostate without lower urinary tract symptoms: Secondary | ICD-10-CM | POA: Insufficient documentation

## 2021-01-14 DIAGNOSIS — I700 Atherosclerosis of aorta: Secondary | ICD-10-CM | POA: Diagnosis not present

## 2021-01-14 DIAGNOSIS — N1330 Unspecified hydronephrosis: Secondary | ICD-10-CM | POA: Insufficient documentation

## 2021-01-14 DIAGNOSIS — C7802 Secondary malignant neoplasm of left lung: Secondary | ICD-10-CM | POA: Insufficient documentation

## 2021-01-14 DIAGNOSIS — Z79899 Other long term (current) drug therapy: Secondary | ICD-10-CM | POA: Diagnosis not present

## 2021-01-14 DIAGNOSIS — C669 Malignant neoplasm of unspecified ureter: Secondary | ICD-10-CM

## 2021-01-14 DIAGNOSIS — L309 Dermatitis, unspecified: Secondary | ICD-10-CM | POA: Diagnosis not present

## 2021-01-14 DIAGNOSIS — C7801 Secondary malignant neoplasm of right lung: Secondary | ICD-10-CM | POA: Insufficient documentation

## 2021-01-14 DIAGNOSIS — Z85828 Personal history of other malignant neoplasm of skin: Secondary | ICD-10-CM | POA: Insufficient documentation

## 2021-01-14 DIAGNOSIS — Z885 Allergy status to narcotic agent status: Secondary | ICD-10-CM | POA: Diagnosis not present

## 2021-01-14 DIAGNOSIS — I2510 Atherosclerotic heart disease of native coronary artery without angina pectoris: Secondary | ICD-10-CM | POA: Diagnosis not present

## 2021-01-14 DIAGNOSIS — Z87440 Personal history of urinary (tract) infections: Secondary | ICD-10-CM | POA: Diagnosis not present

## 2021-01-14 DIAGNOSIS — C775 Secondary and unspecified malignant neoplasm of intrapelvic lymph nodes: Secondary | ICD-10-CM | POA: Diagnosis not present

## 2021-01-14 DIAGNOSIS — Z882 Allergy status to sulfonamides status: Secondary | ICD-10-CM | POA: Diagnosis not present

## 2021-01-14 DIAGNOSIS — Z8249 Family history of ischemic heart disease and other diseases of the circulatory system: Secondary | ICD-10-CM | POA: Insufficient documentation

## 2021-01-14 DIAGNOSIS — C679 Malignant neoplasm of bladder, unspecified: Secondary | ICD-10-CM | POA: Diagnosis not present

## 2021-01-14 DIAGNOSIS — Z5111 Encounter for antineoplastic chemotherapy: Secondary | ICD-10-CM

## 2021-01-14 DIAGNOSIS — Z5112 Encounter for antineoplastic immunotherapy: Secondary | ICD-10-CM | POA: Diagnosis not present

## 2021-01-14 DIAGNOSIS — N401 Enlarged prostate with lower urinary tract symptoms: Secondary | ICD-10-CM

## 2021-01-14 DIAGNOSIS — K8020 Calculus of gallbladder without cholecystitis without obstruction: Secondary | ICD-10-CM | POA: Insufficient documentation

## 2021-01-14 DIAGNOSIS — Z8042 Family history of malignant neoplasm of prostate: Secondary | ICD-10-CM | POA: Insufficient documentation

## 2021-01-14 LAB — COMPREHENSIVE METABOLIC PANEL
ALT: 19 U/L (ref 0–44)
AST: 30 U/L (ref 15–41)
Albumin: 3.6 g/dL (ref 3.5–5.0)
Alkaline Phosphatase: 56 U/L (ref 38–126)
Anion gap: 8 (ref 5–15)
BUN: 17 mg/dL (ref 8–23)
CO2: 27 mmol/L (ref 22–32)
Calcium: 8.7 mg/dL — ABNORMAL LOW (ref 8.9–10.3)
Chloride: 98 mmol/L (ref 98–111)
Creatinine, Ser: 1.08 mg/dL (ref 0.61–1.24)
GFR, Estimated: >60 mL/min (ref >60)
Glucose, Bld: 157 mg/dL — ABNORMAL HIGH (ref 70–99)
Potassium: 3.7 mmol/L (ref 3.5–5.1)
Sodium: 133 mmol/L — ABNORMAL LOW (ref 135–145)
Total Bilirubin: 0.7 mg/dL (ref 0.3–1.2)
Total Protein: 6.8 g/dL (ref 6.5–8.1)

## 2021-01-14 LAB — CBC WITH DIFFERENTIAL/PLATELET
Abs Immature Granulocytes: 0.02 10*3/uL (ref 0.00–0.07)
Basophils Absolute: 0.1 10*3/uL (ref 0.0–0.1)
Basophils Relative: 1 %
Eosinophils Absolute: 0.3 10*3/uL (ref 0.0–0.5)
Eosinophils Relative: 5 %
HCT: 39.8 % (ref 39.0–52.0)
Hemoglobin: 13.9 g/dL (ref 13.0–17.0)
Immature Granulocytes: 0 %
Lymphocytes Relative: 24 %
Lymphs Abs: 1.4 10*3/uL (ref 0.7–4.0)
MCH: 30.5 pg (ref 26.0–34.0)
MCHC: 34.9 g/dL (ref 30.0–36.0)
MCV: 87.5 fL (ref 80.0–100.0)
Monocytes Absolute: 0.4 10*3/uL (ref 0.1–1.0)
Monocytes Relative: 7 %
Neutro Abs: 3.8 10*3/uL (ref 1.7–7.7)
Neutrophils Relative %: 63 %
Platelets: 248 10*3/uL (ref 150–400)
RBC: 4.55 MIL/uL (ref 4.22–5.81)
RDW: 12.4 % (ref 11.5–15.5)
WBC: 6 10*3/uL (ref 4.0–10.5)
nRBC: 0 % (ref 0.0–0.2)

## 2021-01-14 MED ORDER — HEPARIN SOD (PORK) LOCK FLUSH 100 UNIT/ML IV SOLN
INTRAVENOUS | Status: AC
  Administered 2021-01-14: 500 [IU]
  Filled 2021-01-14: qty 5

## 2021-01-14 MED ORDER — SODIUM CHLORIDE 0.9 % IV SOLN
10.0000 mg | Freq: Once | INTRAVENOUS | Status: AC
  Administered 2021-01-14: 10 mg via INTRAVENOUS
  Filled 2021-01-14: qty 10

## 2021-01-14 MED ORDER — SODIUM CHLORIDE 0.9 % IV SOLN
90.0000 mg | Freq: Once | INTRAVENOUS | Status: AC
  Administered 2021-01-14: 90 mg via INTRAVENOUS
  Filled 2021-01-14: qty 9

## 2021-01-14 MED ORDER — PROCHLORPERAZINE MALEATE 10 MG PO TABS: 10.0000 mg | ORAL_TABLET | Freq: Four times a day (QID) | ORAL | 0 refills | Status: DC | PRN

## 2021-01-14 MED ORDER — HEPARIN SOD (PORK) LOCK FLUSH 100 UNIT/ML IV SOLN
500.0000 [IU] | Freq: Once | INTRAVENOUS | Status: AC | PRN
  Filled 2021-01-14: qty 5

## 2021-01-14 MED ORDER — SODIUM CHLORIDE 0.9 % IV SOLN
Freq: Once | INTRAVENOUS | Status: AC
  Filled 2021-01-14: qty 250

## 2021-01-14 MED ORDER — PALONOSETRON HCL INJECTION 0.25 MG/5ML
0.2500 mg | Freq: Once | INTRAVENOUS | Status: AC
  Administered 2021-01-14: 0.25 mg via INTRAVENOUS
  Filled 2021-01-14: qty 5

## 2021-01-14 MED ORDER — NYSTATIN 100000 UNIT/GM EX POWD: 1.0000 "application " | Freq: Three times a day (TID) | CUTANEOUS | 0 refills | Status: DC

## 2021-01-14 NOTE — Progress Notes (Signed)
Pt eating and drinking good, bowels work ok and he takes a stool softner but does not know the name of it. No pain today. He wants a refill of nystatin powder for his itchy scrotums.

## 2021-01-14 NOTE — Progress Notes (Signed)
Hematology/Oncology Consult note [facility]  [phone] [phone]  Patient Care Team: Idelle Crouch, MD as PCP - General (Internal Medicine) Sindy Guadeloupe, MD as Consulting Physician (Hematology and Oncology) Sindy Guadeloupe, MD as Consulting Physician (Hematology and Oncology) Sindy Guadeloupe, MD as Consulting Physician (Hematology and Oncology)   Name of the patient: Anthony Gonzales  [id number]  November 30, 1931   Date of visit: 01/14/21  Diagnosis- Metastatic urothelial carcinoma with possible mets to the obturator node. Indeterminate hilar lymph nodes and LUL lung nodule currently in remission    Chief complaint/ Reason for visit-on treatment assessment prior to next cycle of Padcev cycle 5-day 1  Heme/Onc history: patient is a 87 year old male with past medical history significant for hypertension and long-standing history of superficial bladder cancer for which he sees [name].  He has undergone TURBT as well as ureteroscopy since 2017 along with mitomycin as well in the past.  Most recently he underwent CT abdomen on 08/06/2017 which showed a soft tissue mass in the right renal pelvis concerning for upper tract urothelial neoplasm.  Soft tissue fullness at the right ureterovesical junction with proximal right hydroureteronephrosis.  Several millimeter attenuation lesion in the pancreatic head.   He underwent diagnostic ureteroscopy and was found to have 2 high-grade lesions within his right kidney.  There was a nodular high-grade appearing lesion in the right anterior renal pelvis.  There was also a second ureteral tumor fungating from the right ureteral orifice extending into the distal ureter also consistent with high-grade invasive urothelial carcinoma.  Muscle invasion could not be assessed.  He also underwent a CT chest which showed a rounded nodule in the right upper lobe measuring 14 mm concerning for metastases.  2 other 3 mm  lesions were also noted in the left upper lobe likely benign   Plan initially was neoadjuvant chemotherapy followed by possible surgery but given the presence of lung lesion patient has been referred to oncology for the same.   PET/CT on 09/21/17 showed: IMPRESSION: 1. Hypermetabolic right obturator lymph node is most indicative of metastatic disease. 2. Mildly hypermetabolic left hilar lymph nodes are nonspecific. Continued attention on follow-up exams is warranted. 3. Right upper lobe pulmonary nodule shows metabolism after just above blood pool and is therefore indeterminate. Continued attention on follow-up exams is warranted. 4. Aortic atherosclerosis (ICD10-170.0). Coronary artery calcification. 5. Cholelithiasis    Carboplatin/ gemzar 1 week on and one-week off as patient could not tolerate 2-week on and one week off regimen.  Cycle 1 started on 09/22/2017 Disease progression in March 2020.  Switched to second [employer]   Patient had disease controlled with Keytruda.  However he was noted to have worsening dermatitis with constant flareups requiring steroids.  Beryle Flock is therefore being kept on hold and patient will be switched to third line Padcev.    Treatment has been on hold since February 2021 due to constant flareup of skin rash  Scans in October 2022 showed disease progression with new lung nodules as well as pelvic sidewall adenopathy.  Plan is to therefore restart Padcev    Interval history-overall patient is doing well.  He does report rash in his scrotum for which she has been using nystatin and has been working well for him.  Denies other complaints at this time  ECOG PS- 1 Pain scale- 0 Opioid associated constipation- no  Review of systems- Review of Systems  Constitutional:  Negative for chills, fever, malaise/fatigue  and weight loss.  HENT:  Negative for congestion, ear discharge and nosebleeds.   Eyes:  Negative for blurred vision.  Respiratory:  Negative  for cough, hemoptysis, sputum production, shortness of breath and wheezing.   Cardiovascular:  Negative for chest pain, palpitations, orthopnea and claudication.  Gastrointestinal:  Negative for abdominal pain, blood in stool, constipation, diarrhea, heartburn, melena, nausea and vomiting.  Genitourinary:  Negative for dysuria, flank pain, frequency, hematuria and urgency.  Musculoskeletal:  Negative for back pain, joint pain and myalgias.  Skin:  Negative for rash.  Neurological:  Negative for dizziness, tingling, focal weakness, seizures, weakness and headaches.  Endo/Heme/Allergies:  Does not bruise/bleed easily.  Psychiatric/Behavioral:  Negative for depression and suicidal ideas. The patient does not have insomnia.      Allergies  Allergen Reactions   Demerol [Meperidine] Nausea And Vomiting   Lipitor [Atorvastatin] Swelling   Sulfa Antibiotics Nausea And Vomiting and Rash     Past Medical History:  Diagnosis Date   Arthritis    Benign fibroma of prostate 08/23/2013   BPH (benign prostatic hyperplasia)    Calculus of kidney 08/23/2013   Glaucoma    no drops in 3 mo pressure good, pt denies glaucoma, eye pressure has been measuring alright.   History of kidney stones    HLD (hyperlipidemia)    HOH (hard of hearing)    Left Hearing Aid   HTN (hypertension) 12/26/2014   Hypertension    Hyponatremia 12/26/2014   Migraines    history of migraines when he was younger.   Restless leg syndrome    Sinus drainage    Skin cancer    Skin cancer    left hand 06/2019   Urothelial cancer ([place])    chemo tx's.   UTI (lower urinary tract infection) 12/26/2014   Vertigo      Past Surgical History:  Procedure Laterality Date   COLONOSCOPY     CYSTOSCOPY W/ RETROGRADES Right 01/30/2015   Procedure: CYSTOSCOPY WITH RETROGRADE PYELOGRAM;  Surgeon: [name], MD;  Location: [facility];  Service: Urology;  Laterality: Right;   CYSTOSCOPY W/ RETROGRADES Bilateral 02/26/2016    Procedure: CYSTOSCOPY WITH RETROGRADE PYELOGRAM;  Surgeon: [name], MD;  Location: [facility];  Service: Urology;  Laterality: Bilateral;   CYSTOSCOPY W/ URETERAL STENT PLACEMENT Right 08/20/2015   Procedure: CYSTOSCOPY WITH RETROGRADE PYELOGRAM/POSSIBLE URETERAL STENT PLACEMENT/BLADDER BIOPSY;  Surgeon: [name], MD;  Location: [facility];  Service: Urology;  Laterality: Right;   CYSTOSCOPY W/ URETERAL STENT PLACEMENT Right 09/12/2015   Procedure: CYSTOSCOPY WITH STENT REPLACEMENT;  Surgeon: [name], MD;  Location: [facility];  Service: Urology;  Laterality: Right;   CYSTOSCOPY WITH BIOPSY Right 09/12/2015   Procedure: CYSTOSCOPY WITH BLADDER AND URETERAL BIOPSY;  Surgeon: [name], MD;  Location: [facility];  Service: Urology;  Laterality: Right;   CYSTOSCOPY WITH STENT PLACEMENT Right 01/30/2015   Procedure: CYSTOSCOPY WITH STENT PLACEMENT;  Surgeon: [name], MD;  Location: [facility];  Service: Urology;  Laterality: Right;   CYSTOSCOPY WITH STENT PLACEMENT Right 12/28/2017   Procedure: CYSTOSCOPY WITH STENT Exchange;  Surgeon: [name], MD;  Location: [facility];  Service: Urology;  Laterality: Right;   CYSTOSCOPY/URETEROSCOPY/HOLMIUM LASER/STENT PLACEMENT Right 08/19/2017   Procedure: CYSTOSCOPY/URETEROSCOPY/HOLMIUM LASER/STENT PLACEMENT;  Surgeon: [name], MD;  Location: [facility];  Service: Urology;  Laterality: Right;   EYE SURGERY Bilateral    Cataract Extraction with IOL   goiter removal     HOLMIUM LASER APPLICATION N/A 0/09/1217  Procedure:  HOLMIUM LASER APPLICATION;  Surgeon: [name], MD;  Location: [facility];  Service: Urology;  Laterality: N/A;   PORTA CATH INSERTION N/A 09/23/2017   Procedure: PORTA CATH INSERTION;  Surgeon: Algernon Huxley, MD;  Location: [facility];  Service: Cardiovascular;  Laterality: N/A;   SPERMATOCELECTOMY     TONSILLECTOMY     TRANSURETHRAL RESECTION OF BLADDER TUMOR N/A 02/26/2016   Procedure: TRANSURETHRAL  RESECTION OF BLADDER TUMOR (TURBT);  Surgeon: [name], MD;  Location: [facility];  Service: Urology;  Laterality: N/A;   TRANSURETHRAL RESECTION OF BLADDER TUMOR WITH MITOMYCIN-C N/A 09/12/2015   Procedure: TRANSURETHRAL RESECTION OF BLADDER TUMOR ;  Surgeon: [name], MD;  Location: [facility];  Service: Urology;  Laterality: N/A;   TRANSURETHRAL RESECTION OF BLADDER TUMOR WITH MITOMYCIN-C N/A 03/24/2016   Procedure: TRANSURETHRAL RESECTION OF BLADDER TUMOR WITH MITOMYCIN-C  (SMALL);  Surgeon: [name], MD;  Location: [facility];  Service: Urology;  Laterality: N/A;   URETERAL BIOPSY Right 08/19/2017   Procedure: Renal Mass BIOPSY;  Surgeon: [name], MD;  Location: [facility];  Service: Urology;  Laterality: Right;   URETEROSCOPY Right 01/30/2015   Procedure: URETEROSCOPY/ WITH BIOPSY AND CYTOLOGY BRUSHING;  Surgeon: [name], MD;  Location: [facility];  Service: Urology;  Laterality: Right;   URETEROSCOPY Right 08/20/2015   Procedure: URETEROSCOPY;  Surgeon: [name], MD;  Location: [facility];  Service: Urology;  Laterality: Right;   URETEROSCOPY Right 09/12/2015   Procedure: URETEROSCOPY;  Surgeon: [name], MD;  Location: [facility];  Service: Urology;  Laterality: Right;   URETEROSCOPY Right 02/26/2016   Procedure: URETEROSCOPY;  Surgeon: [name], MD;  Location: [facility];  Service: Urology;  Laterality: Right;    Social History   Socioeconomic History   Marital status: Married    Spouse name: Not on file   Number of children: Not on file   Years of education: Not on file   Highest education level: Not on file  Occupational History   Not on file  Tobacco Use   Smoking status: Never   Smokeless tobacco: Never  Vaping Use   Vaping Use: Never used  Substance and Sexual Activity   Alcohol use: No    Alcohol/week: 0.0 standard drinks   Drug use: No   Sexual activity: Not Currently  Other Topics Concern   Not on file  Social History Narrative    Not on file   Social Determinants of Health   Financial Resource Strain: Not on file  Food Insecurity: Not on file  Transportation Needs: Not on file  Physical Activity: Not on file  Stress: Not on file  Social Connections: Not on file  Intimate Partner Violence: Not on file    Family History  Problem Relation Age of Onset   Hypertension Mother    Hypertension Father    Prostate cancer Brother    Kidney disease Neg Hx    Kidney cancer Neg Hx    Bladder Cancer Neg Hx      Current Outpatient Medications:    acetaminophen (TYLENOL) 500 MG tablet, Take by mouth., Disp: , Rfl:    amLODipine (NORVASC) 5 MG tablet, Take 5 mg by mouth daily. , Disp: , Rfl:    benazepril-hydrochlorthiazide (LOTENSIN HCT) 20-12.5 MG tablet, Take 1 tablet by mouth daily. , Disp: , Rfl:    diazepam (VALIUM) 5 MG tablet, TAKE 1 TABLET (5 MG TOTAL) BY MOUTH EVERY 12 (TWELVE) HOURS AS NEEDED FOR ANXIETY OR  SLEEP, Disp: , Rfl:    fenofibrate micronized (LOFIBRA) 134 MG capsule, Take 134 mg by mouth daily before breakfast. , Disp: , Rfl:    folic acid (FOLVITE) 1 MG tablet, Take by mouth., Disp: , Rfl:    Multiple Vitamin (MULTIVITAMIN WITH MINERALS) TABS tablet, Take 1 tablet by mouth daily., Disp: , Rfl:    nystatin (MYCOSTATIN/NYSTOP) powder, Apply 1 application topically 3 (three) times daily. To under arms area that has rash, Disp: 30 g, Rfl: 0   psyllium (METAMUCIL) 58.6 % packet, Take 1 packet by mouth daily., Disp: , Rfl:    tamsulosin (FLOMAX) 0.4 MG CAPS capsule, TAKE 1 CAPSULE BY MOUTH EVERY DAY, Disp: 90 capsule, Rfl: 3   camphor-menthol (SARNA) lotion, Apply 1 application topically as needed for itching. (Patient not taking: No sig reported), Disp: , Rfl:    HYDROcodone-acetaminophen (NORCO/VICODIN) 5-325 MG tablet, Take 1 tablet by mouth every 6 (six) hours as needed for severe pain. (Patient not taking: No sig reported), Disp: 30 tablet, Rfl: 0   LAGEVRIO 200 MG CAPS capsule, Take 4 capsules by  mouth 2 (two) times daily. (Patient not taking: Reported on 01/14/2021), Disp: , Rfl:    triamcinolone cream (KENALOG) 0.1 %, Apply 1 application topically 2 (two) times daily. (Patient not taking: Reported on 01/14/2021), Disp: , Rfl:  No current facility-administered medications for this visit.  Facility-Administered Medications Ordered in Other Visits:    sodium chloride flush (NS) 0.9 % injection 10 mL, 10 mL, Intravenous, Once, Sindy Guadeloupe, MD   sodium chloride flush (NS) 0.9 % injection 10 mL, 10 mL, Intravenous, PRN, Sindy Guadeloupe, MD  Physical exam:  Vitals:   01/14/21 0910  BP: 136/67  Pulse: 80  Resp: 16  Temp: 97.8 F (36.6 C)  TempSrc: Oral  Weight: 194 lb 6.4 oz (88.2 kg)   Physical Exam Constitutional:      General: He is not in acute distress. Cardiovascular:     Rate and Rhythm: Normal rate and regular rhythm.     Heart sounds: Normal heart sounds.  Pulmonary:     Effort: Pulmonary effort is normal.     Breath sounds: Normal breath sounds.  Abdominal:     General: Bowel sounds are normal.     Palpations: Abdomen is soft.  Skin:    General: Skin is warm and dry.  Neurological:     Mental Status: He is alert and oriented to person, place, and time.     CMP Latest Ref Rng & Units 01/14/2021  Glucose 70 - 99 mg/dL 157(H)  BUN 8 - 23 mg/dL 17  Creatinine 0.61 - 1.24 mg/dL 1.08  Sodium 135 - 145 mmol/L 133(L)  Potassium 3.5 - 5.1 mmol/L 3.7  Chloride 98 - 111 mmol/L 98  CO2 22 - 32 mmol/L 27  Calcium 8.9 - 10.3 mg/dL 8.7(L)  Total Protein 6.5 - 8.1 g/dL 6.8  Total Bilirubin 0.3 - 1.2 mg/dL 0.7  Alkaline Phos 38 - 126 U/L 56  AST 15 - 41 U/L 30  ALT 0 - 44 U/L 19   CBC Latest Ref Rng & Units 01/14/2021  WBC 4.0 - 10.5 K/uL 6.0  Hemoglobin 13.0 - 17.0 g/dL 13.9  Hematocrit 39.0 - 52.0 % 39.8  Platelets 150 - 400 K/uL 248    No images are attached to the encounter.  CT CHEST ABDOMEN PELVIS W CONTRAST  Result Date: 01/05/2021 CLINICAL DATA:   Restaging right ureteral and bladder cancer, chemotherapy complete EXAM:  CT CHEST, ABDOMEN, AND PELVIS WITH CONTRAST TECHNIQUE: Multidetector CT imaging of the chest, abdomen and pelvis was performed following the standard protocol during bolus administration of intravenous contrast. CONTRAST:  140mL OMNIPAQUE IOHEXOL 300 MG/ML SOLN, additional oral enteric contrast COMPARISON:  08/02/2020, 03/07/2020 FINDINGS: CT CHEST FINDINGS Cardiovascular: Aortic atherosclerosis. Normal heart size. Left coronary artery calcifications. No pericardial effusion. Mediastinum/Nodes: No enlarged mediastinal, hilar, or axillary lymph nodes. Thyroid gland, trachea, and esophagus demonstrate no significant findings. Lungs/Pleura: Unchanged, lobulated nodule of the medial right upper lobe measuring 1.3 x 0.9 cm (series 3, image 78). Unchanged 0.3 cm nodule of the anterior right upper lobe (series 3, image 76). New, lobulated nodule of the anterior superior segment left lower lobe abutting the minor fissure measuring 1.0 x 0.7 cm (series 3, image 87). New subpleural nodule of the superior segment right lower lobe measuring 0.6 cm (series 3, image 76). New 0.3 cm nodule of the posterior right apex (series 3, image 38). No pleural effusion or pneumothorax. Musculoskeletal: No chest wall mass or suspicious bone lesions identified. CT ABDOMEN PELVIS FINDINGS Hepatobiliary: No solid liver abnormality is seen. Gallstones. No gallbladder wall thickening, or biliary dilatation. Pancreas: Unchanged small cystic lesions of the pancreatic head and uncinate measuring up to 1.3 cm (series 2, image 81). No pancreatic ductal dilatation or surrounding inflammatory changes. Spleen: Normal in size without significant abnormality. Adrenals/Urinary Tract: Adrenal glands are unremarkable. Kidneys are normal, without renal calculi, solid lesion, or hydronephrosis. Unchanged diverticulum at the posterior left aspect of the urinary bladder (series 2, image 121).  Stomach/Bowel: Stomach is within normal limits. Appendix appears normal. No evidence of bowel wall thickening, distention, or inflammatory changes. Sigmoid diverticula. Vascular/Lymphatic: No significant vascular findings are present. Interval enlargement of a right pelvic sidewall lymph node measuring 3.2 x 3.1 cm (series 2, image 115) Reproductive: Mild prostatomegaly. Other: No abdominal wall hernia or abnormality. No abdominopelvic ascites. Musculoskeletal: No acute or significant osseous findings. IMPRESSION: 1. Multiple new small pulmonary nodules, consistent with pulmonary metastatic disease. 2. Previously seen nodules are unchanged in size, but in general remain highly suspicious for small metastases. 3. Interval enlargement of a right pelvic sidewall lymph node measuring 3.2 x 3.1 cm, consistent with nodal metastatic disease. 4. No recurrent bladder or ureteral mass/soft tissue is noted. 5. Cholelithiasis. 6. Unchanged small cystic lesions of the pancreatic head and uncinate measuring up to 1.3 cm. Attention on follow-up. No specific follow-up or characterization is required for these lesions measuring smaller than 2 cm. 7. Mild prostatomegaly. 8. Coronary artery disease. Aortic Atherosclerosis (ICD10-I70.0). Electronically Signed   By: Delanna Ahmadi M.D.   On: 01/05/2021 09:52     Assessment and plan- Patient is a 87 y.o. male with metastatic urothelial carcinoma with metastases to lungs and pelvic adenopathy here for on treatment assessment prior to cycle 5-day 1 of Padcev  Patient had developed skin rash from [facility] and therefore treatment was on hold for 18 months without any disease recurrence.  However most recent CT scan shows new lung nodules as well as pelvic sidewall adenopathy consistent with progression.  I will therefore restart Padcev at this time which she will receive 2 weeks on 1 week off instead of 3 weeks on and 1 week off.  We will closely monitor for any symptoms of rash.   Discussed risks and benefits of Padcev including all but not limited to nausea, vomiting, diarrhea, low blood counts, high blood sugars peripheral neuropathy.  Treatment will be given with a palliative  intent.  Patient understands and agrees to proceed as planned.  He will directly proceed with cycle 5-day 8 of chemotherapy next week and I will see him back in 3 weeks for cycle 6-day 1   Visit Diagnosis 1. 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 Encounter for antineoplastic chemotherapy      Dr. Randa Evens, MD, MPH [facility] [phone] 01/14/2021 9:11 AM

## 2021-01-14 NOTE — Patient Instructions (Signed)
[facility] ONCOL[facility]   Discharge Instructions: Thank you for choosing [facility] to provide your oncology and hematology care.  If you have a lab appointment with the [facility], please go directly to the [facility] and check in at the registration area.  Wear comfortable clothing and clothing appropriate for easy access to any Portacath or PICC line.   We strive to give you quality time with your provider. You may need to reschedule your appointment if you arrive late (15 or more minutes).  Arriving late affects you and other patients whose appointments are after yours.  Also, if you miss three or more appointments without notifying the office, you may be dismissed from the clinic at the provider's discretion.      For prescription refill requests, have your pharmacy contact our office and allow 72 hours for refills to be completed.    Today you received the following chemotherapy and/or immunotherapy agents: Padcev.      To help prevent nausea and vomiting after your treatment, we encourage you to take your nausea medication as directed.  BELOW ARE SYMPTOMS THAT SHOULD BE REPORTED IMMEDIATELY: *FEVER GREATER THAN 100.4 F (38 C) OR HIGHER *CHILLS OR SWEATING *NAUSEA AND VOMITING THAT IS NOT CONTROLLED WITH YOUR NAUSEA MEDICATION *UNUSUAL SHORTNESS OF BREATH *UNUSUAL BRUISING OR BLEEDING *URINARY PROBLEMS (pain or burning when urinating, or frequent urination) *BOWEL PROBLEMS (unusual diarrhea, constipation, pain near the anus) TENDERNESS IN MOUTH AND THROAT WITH OR WITHOUT PRESENCE OF ULCERS (sore throat, sores in mouth, or a toothache) UNUSUAL RASH, SWELLING OR PAIN  UNUSUAL VAGINAL DISCHARGE OR ITCHING   Items with * indicate a potential emergency and should be followed up as soon as possible or go to the Emergency Department if any problems should occur.  Please show the CHEMOTHERAPY ALERT CARD or IMMUNOTHERAPY ALERT CARD at check-in  to the Emergency Department and triage nurse.  Should you have questions after your visit or need to cancel or reschedule your appointment, please contact [facility]  [phone] and follow the prompts.  Office hours are 8:00 a.m. to 4:30 p.m. Monday - Friday. Please note that voicemails left after 4:00 p.m. may not be returned until the following business day.  We are closed weekends and major holidays. You have access to a nurse at all times for urgent questions. Please call the main number to the clinic [phone] and follow the prompts.  For any non-urgent questions, you may also contact your provider using MyChart. We now offer e-Visits for anyone 10 and older to request care online for non-urgent symptoms. For details visit [email or web address].   Also download the MyChart app! Go to the app store, search "MyChart", open the app, select [facility], and log in with your MyChart username and password.  Due to Covid, a mask is required upon entering the hospital/clinic. If you do not have a mask, one will be given to you upon arrival. For doctor visits, patients may have 1 support person aged 72 or older with them. For treatment visits, patients cannot have anyone with them due to current Covid guidelines and our immunocompromised population.

## 2021-01-16 ENCOUNTER — Telehealth: Payer: Self-pay | Admitting: *Deleted

## 2021-01-16 NOTE — Telephone Encounter (Signed)
Call returned to patient and informed of physician advise. He stated that he has some Miralax in the cabinet and had me to spell Senna for him so that he can go get some.

## 2021-01-16 NOTE — Telephone Encounter (Signed)
Miralax once or twice daily with senna 2 tab at night

## 2021-01-16 NOTE — Telephone Encounter (Signed)
Patient called reporting that he is constipated and the paper he has sad that he is to call us if this occurs. He states he has not had a bowel movement in 2 days now and has not taken anything for it at this time. Please advise

## 2021-01-21 ENCOUNTER — Inpatient Hospital Stay: Payer: PPO

## 2021-01-21 ENCOUNTER — Other Ambulatory Visit: Payer: Self-pay

## 2021-01-21 ENCOUNTER — Other Ambulatory Visit: Payer: Self-pay | Admitting: Oncology

## 2021-01-21 VITALS — BP 144/76 | HR 69 | Temp 96.0°F | Resp 17 | Wt 196.1 lb

## 2021-01-21 DIAGNOSIS — Z5112 Encounter for antineoplastic immunotherapy: Secondary | ICD-10-CM | POA: Diagnosis not present

## 2021-01-21 DIAGNOSIS — C669 Malignant neoplasm of unspecified ureter: Secondary | ICD-10-CM

## 2021-01-21 LAB — CBC WITH DIFFERENTIAL/PLATELET
Abs Immature Granulocytes: 0.02 10*3/uL (ref 0.00–0.07)
Basophils Absolute: 0 10*3/uL (ref 0.0–0.1)
Basophils Relative: 1 %
Eosinophils Absolute: 0.5 10*3/uL (ref 0.0–0.5)
Eosinophils Relative: 8 %
HCT: 38.6 % — ABNORMAL LOW (ref 39.0–52.0)
Hemoglobin: 13.5 g/dL (ref 13.0–17.0)
Immature Granulocytes: 0 %
Lymphocytes Relative: 20 %
Lymphs Abs: 1.3 10*3/uL (ref 0.7–4.0)
MCH: 30.5 pg (ref 26.0–34.0)
MCHC: 35 g/dL (ref 30.0–36.0)
MCV: 87.3 fL (ref 80.0–100.0)
Monocytes Absolute: 0.7 10*3/uL (ref 0.1–1.0)
Monocytes Relative: 11 %
Neutro Abs: 4 10*3/uL (ref 1.7–7.7)
Neutrophils Relative %: 60 %
Platelets: 243 10*3/uL (ref 150–400)
RBC: 4.42 MIL/uL (ref 4.22–5.81)
RDW: 12.6 % (ref 11.5–15.5)
WBC: 6.6 10*3/uL (ref 4.0–10.5)
nRBC: 0 % (ref 0.0–0.2)

## 2021-01-21 LAB — COMPREHENSIVE METABOLIC PANEL
ALT: 20 U/L (ref 0–44)
AST: 33 U/L (ref 15–41)
Albumin: 3.6 g/dL (ref 3.5–5.0)
Alkaline Phosphatase: 52 U/L (ref 38–126)
Anion gap: 8 (ref 5–15)
BUN: 18 mg/dL (ref 8–23)
CO2: 30 mmol/L (ref 22–32)
Calcium: 8.6 mg/dL — ABNORMAL LOW (ref 8.9–10.3)
Chloride: 94 mmol/L — ABNORMAL LOW (ref 98–111)
Creatinine, Ser: 0.9 mg/dL (ref 0.61–1.24)
GFR, Estimated: >60 mL/min (ref >60)
Glucose, Bld: 108 mg/dL — ABNORMAL HIGH (ref 70–99)
Potassium: 3.8 mmol/L (ref 3.5–5.1)
Sodium: 132 mmol/L — ABNORMAL LOW (ref 135–145)
Total Bilirubin: 0.3 mg/dL (ref 0.3–1.2)
Total Protein: 6.6 g/dL (ref 6.5–8.1)

## 2021-01-21 MED ORDER — SODIUM CHLORIDE 0.9 % IV SOLN
Freq: Once | INTRAVENOUS | Status: AC
  Filled 2021-01-21: qty 250

## 2021-01-21 MED ORDER — SODIUM CHLORIDE 0.9 % IV SOLN
90.0000 mg | Freq: Once | INTRAVENOUS | Status: AC
  Administered 2021-01-21: 90 mg via INTRAVENOUS
  Filled 2021-01-21: qty 9

## 2021-01-21 MED ORDER — HEPARIN SOD (PORK) LOCK FLUSH 100 UNIT/ML IV SOLN
INTRAVENOUS | Status: AC
  Administered 2021-01-21: 500 [IU]
  Filled 2021-01-21: qty 5

## 2021-01-21 MED ORDER — HEPARIN SOD (PORK) LOCK FLUSH 100 UNIT/ML IV SOLN
500.0000 [IU] | Freq: Once | INTRAVENOUS | Status: AC | PRN
  Filled 2021-01-21: qty 5

## 2021-01-21 MED ORDER — PALONOSETRON HCL INJECTION 0.25 MG/5ML
0.2500 mg | Freq: Once | INTRAVENOUS | Status: AC
  Administered 2021-01-21: 0.25 mg via INTRAVENOUS
  Filled 2021-01-21: qty 5

## 2021-01-21 MED ORDER — SODIUM CHLORIDE 0.9 % IV SOLN
10.0000 mg | Freq: Once | INTRAVENOUS | Status: AC
  Administered 2021-01-21: 10 mg via INTRAVENOUS
  Filled 2021-01-21: qty 10

## 2021-01-21 NOTE — Patient Instructions (Signed)
[facility]  Discharge Instructions: Thank you for choosing [facility] to provide your oncology and hematology care.  If you have a lab appointment with the [facility], please go directly to the [facility] and check in at the registration area.  Wear comfortable clothing and clothing appropriate for easy access to any Portacath or PICC line.   We strive to give you quality time with your provider. You may need to reschedule your appointment if you arrive late (15 or more minutes).  Arriving late affects you and other patients whose appointments are after yours.  Also, if you miss three or more appointments without notifying the office, you may be dismissed from the clinic at the provider's discretion.      For prescription refill requests, have your pharmacy contact our office and allow 72 hours for refills to be completed.    Today you received the following chemotherapy and/or immunotherapy agents Padcev      To help prevent nausea and vomiting after your treatment, we encourage you to take your nausea medication as directed.  BELOW ARE SYMPTOMS THAT SHOULD BE REPORTED IMMEDIATELY: *FEVER GREATER THAN 100.4 F (38 C) OR HIGHER *CHILLS OR SWEATING *NAUSEA AND VOMITING THAT IS NOT CONTROLLED WITH YOUR NAUSEA MEDICATION *UNUSUAL SHORTNESS OF BREATH *UNUSUAL BRUISING OR BLEEDING *URINARY PROBLEMS (pain or burning when urinating, or frequent urination) *BOWEL PROBLEMS (unusual diarrhea, constipation, pain near the anus) TENDERNESS IN MOUTH AND THROAT WITH OR WITHOUT PRESENCE OF ULCERS (sore throat, sores in mouth, or a toothache) UNUSUAL RASH, SWELLING OR PAIN  UNUSUAL VAGINAL DISCHARGE OR ITCHING   Items with * indicate a potential emergency and should be followed up as soon as possible or go to the Emergency Department if any problems should occur.  Please show the CHEMOTHERAPY ALERT CARD or IMMUNOTHERAPY ALERT CARD at check-in to  the Emergency Department and triage nurse.  Should you have questions after your visit or need to cancel or reschedule your appointment, please contact [facility]  [phone] and follow the prompts.  Office hours are 8:00 a.m. to 4:30 p.m. Monday - Friday. Please note that voicemails left after 4:00 p.m. may not be returned until the following business day.  We are closed weekends and major holidays. You have access to a nurse at all times for urgent questions. Please call the main number to the clinic [phone] and follow the prompts.  For any non-urgent questions, you may also contact your provider using MyChart. We now offer e-Visits for anyone 31 and older to request care online for non-urgent symptoms. For details visit [email or web address].   Also download the MyChart app! Go to the app store, search "MyChart", open the app, select [facility], and log in with your MyChart username and password.  Due to Covid, a mask is required upon entering the hospital/clinic. If you do not have a mask, one will be given to you upon arrival. For doctor visits, patients may have 1 support person aged 52 or older with them. For treatment visits, patients cannot have anyone with them due to current Covid guidelines and our immunocompromised population.

## 2021-01-21 NOTE — Progress Notes (Signed)
Pt reports intermittent itching in hands and feet, pt reports that keeping his hands/feet "lubricated" reduces the itching. Pt reports no itching today but "my hands feel funny". Pt also reports having a nose bleed this morning, Pt reports that nose bleeds are not abnormal.  Dr. Janese Banks aware, Per Dr. Janese Banks okay to proceed with treatment pending lab results.

## 2021-02-04 ENCOUNTER — Other Ambulatory Visit: Payer: Self-pay | Admitting: *Deleted

## 2021-02-04 ENCOUNTER — Encounter: Payer: Self-pay | Admitting: Oncology

## 2021-02-04 ENCOUNTER — Other Ambulatory Visit: Payer: Self-pay

## 2021-02-04 ENCOUNTER — Inpatient Hospital Stay: Payer: PPO

## 2021-02-04 ENCOUNTER — Inpatient Hospital Stay: Payer: PPO | Admitting: Oncology

## 2021-02-04 VITALS — BP 143/79 | HR 85 | Temp 97.6°F | Resp 18 | Wt 197.3 lb

## 2021-02-04 VITALS — BP 145/64 | HR 57 | Resp 16

## 2021-02-04 DIAGNOSIS — L270 Generalized skin eruption due to drugs and medicaments taken internally: Secondary | ICD-10-CM | POA: Diagnosis not present

## 2021-02-04 DIAGNOSIS — C669 Malignant neoplasm of unspecified ureter: Secondary | ICD-10-CM

## 2021-02-04 DIAGNOSIS — Z5111 Encounter for antineoplastic chemotherapy: Secondary | ICD-10-CM | POA: Diagnosis not present

## 2021-02-04 DIAGNOSIS — Z5112 Encounter for antineoplastic immunotherapy: Secondary | ICD-10-CM | POA: Diagnosis not present

## 2021-02-04 LAB — CBC WITH DIFFERENTIAL/PLATELET
Abs Immature Granulocytes: 0.03 10*3/uL (ref 0.00–0.07)
Basophils Absolute: 0.1 10*3/uL (ref 0.0–0.1)
Basophils Relative: 1 %
Eosinophils Absolute: 0.5 10*3/uL (ref 0.0–0.5)
Eosinophils Relative: 8 %
HCT: 38.1 % — ABNORMAL LOW (ref 39.0–52.0)
Hemoglobin: 13.2 g/dL (ref 13.0–17.0)
Immature Granulocytes: 1 %
Lymphocytes Relative: 19 %
Lymphs Abs: 1.2 10*3/uL (ref 0.7–4.0)
MCH: 30.6 pg (ref 26.0–34.0)
MCHC: 34.6 g/dL (ref 30.0–36.0)
MCV: 88.2 fL (ref 80.0–100.0)
Monocytes Absolute: 0.6 10*3/uL (ref 0.1–1.0)
Monocytes Relative: 10 %
Neutro Abs: 3.7 10*3/uL (ref 1.7–7.7)
Neutrophils Relative %: 61 %
Platelets: 330 10*3/uL (ref 150–400)
RBC: 4.32 MIL/uL (ref 4.22–5.81)
RDW: 13 % (ref 11.5–15.5)
WBC: 6 10*3/uL (ref 4.0–10.5)
nRBC: 0 % (ref 0.0–0.2)

## 2021-02-04 LAB — COMPREHENSIVE METABOLIC PANEL
ALT: 23 U/L (ref 0–44)
AST: 34 U/L (ref 15–41)
Albumin: 3.9 g/dL (ref 3.5–5.0)
Alkaline Phosphatase: 50 U/L (ref 38–126)
Anion gap: 6 (ref 5–15)
BUN: 20 mg/dL (ref 8–23)
CO2: 29 mmol/L (ref 22–32)
Calcium: 8.6 mg/dL — ABNORMAL LOW (ref 8.9–10.3)
Chloride: 99 mmol/L (ref 98–111)
Creatinine, Ser: 1 mg/dL (ref 0.61–1.24)
GFR, Estimated: >60 mL/min (ref >60)
Glucose, Bld: 131 mg/dL — ABNORMAL HIGH (ref 70–99)
Potassium: 3.7 mmol/L (ref 3.5–5.1)
Sodium: 134 mmol/L — ABNORMAL LOW (ref 135–145)
Total Bilirubin: 0.5 mg/dL (ref 0.3–1.2)
Total Protein: 6.5 g/dL (ref 6.5–8.1)

## 2021-02-04 MED ORDER — UREA 10 % EX CREA: TOPICAL_CREAM | Freq: Three times a day (TID) | CUTANEOUS | 0 refills | Status: DC

## 2021-02-04 MED ORDER — SODIUM CHLORIDE 0.9 % IV SOLN
10.0000 mg | Freq: Once | INTRAVENOUS | Status: AC
  Administered 2021-02-04: 12:00:00 10 mg via INTRAVENOUS
  Filled 2021-02-04: qty 10

## 2021-02-04 MED ORDER — PALONOSETRON HCL INJECTION 0.25 MG/5ML
0.2500 mg | Freq: Once | INTRAVENOUS | Status: AC
  Administered 2021-02-04: 12:00:00 0.25 mg via INTRAVENOUS

## 2021-02-04 MED ORDER — SODIUM CHLORIDE 0.9 % IV SOLN
90.0000 mg | Freq: Once | INTRAVENOUS | Status: AC
  Administered 2021-02-04: 90 mg via INTRAVENOUS
  Filled 2021-02-04: qty 9

## 2021-02-04 MED ORDER — SODIUM CHLORIDE 0.9 % IV SOLN
Freq: Once | INTRAVENOUS | Status: AC
  Filled 2021-02-04: qty 250

## 2021-02-04 MED ORDER — SODIUM CHLORIDE 0.9% FLUSH
10.0000 mL | INTRAVENOUS | Status: DC | PRN
  Administered 2021-02-04: 09:00:00 10 mL via INTRAVENOUS
  Filled 2021-02-04: qty 10

## 2021-02-04 MED ORDER — HEPARIN SOD (PORK) LOCK FLUSH 100 UNIT/ML IV SOLN
500.0000 [IU] | Freq: Once | INTRAVENOUS | Status: AC
  Administered 2021-02-04: 13:00:00 500 [IU] via INTRAVENOUS
  Filled 2021-02-04: qty 5

## 2021-02-04 NOTE — Progress Notes (Signed)
Hands are peeling started a week ago No pain No B/M problem B/P 143/79

## 2021-02-04 NOTE — Progress Notes (Signed)
Hematology/Oncology Consult note [facility]  Telephone:([phone] Fax:([phone]) [phone]  Patient Care Team: Idelle Crouch, MD as PCP - General (Internal Medicine) Sindy Guadeloupe, MD as Consulting Physician (Hematology and Oncology) Sindy Guadeloupe, MD as Consulting Physician (Hematology and Oncology) Sindy Guadeloupe, MD as Consulting Physician (Hematology and Oncology)   Name of the patient: Anthony Gonzales  [id number]  19-Apr-1931   Date of visit: 02/04/21  Diagnosis- Metastatic urothelial carcinoma with possible mets to the obturator node. Indeterminate hilar lymph nodes and LUL lung nodule currently in remission    Chief complaint/ Reason for visit-on treatment assessment prior to cycle 6-day 1 of Padcev  Heme/Onc history:  patient is a 87 year old male with past medical history significant for hypertension and long-standing history of superficial bladder cancer for which he sees [name].  He has undergone TURBT as well as ureteroscopy since 2017 along with mitomycin as well in the past.  Most recently he underwent CT abdomen on 08/06/2017 which showed a soft tissue mass in the right renal pelvis concerning for upper tract urothelial neoplasm.  Soft tissue fullness at the right ureterovesical junction with proximal right hydroureteronephrosis.  Several millimeter attenuation lesion in the pancreatic head.   He underwent diagnostic ureteroscopy and was found to have 2 high-grade lesions within his right kidney.  There was a nodular high-grade appearing lesion in the right anterior renal pelvis.  There was also a second ureteral tumor fungating from the right ureteral orifice extending into the distal ureter also consistent with high-grade invasive urothelial carcinoma.  Muscle invasion could not be assessed.  He also underwent a CT chest which showed a rounded nodule in the right upper lobe measuring 14 mm concerning for metastases.  2 other 3 mm lesions  were also noted in the left upper lobe likely benign   Plan initially was neoadjuvant chemotherapy followed by possible surgery but given the presence of lung lesion patient has been referred to oncology for the same.   PET/CT on 09/21/17 showed: IMPRESSION: 1. Hypermetabolic right obturator lymph node is most indicative of metastatic disease. 2. Mildly hypermetabolic left hilar lymph nodes are nonspecific. Continued attention on follow-up exams is warranted. 3. Right upper lobe pulmonary nodule shows metabolism after just above blood pool and is therefore indeterminate. Continued attention on follow-up exams is warranted. 4. Aortic atherosclerosis (ICD10-170.0). Coronary artery calcification. 5. Cholelithiasis    Carboplatin/ gemzar 1 week on and one-week off as patient could not tolerate 2-week on and one week off regimen.  Cycle 1 started on 09/22/2017 Disease progression in March 2020.  Switched to second [employer]   Patient had disease controlled with Keytruda.  However he was noted to have worsening dermatitis with constant flareups requiring steroids.  Beryle Flock is therefore being kept on hold and patient will be switched to third line Padcev.    Treatment has been on hold since February 2021 due to constant flareup of skin rash   Scans in October 2022 showed disease progression with new lung nodules as well as pelvic sidewall adenopathy.  Plan is to therefore restart Padcev    Interval history-patient had an exfoliating rash over his bilateral palms for which she is using topical emollients.  Denies any rash in his back or chest wall.  Intermittent constipation for which he is taking bowel meds.  Otherwise tolerating treatments well.  ECOG PS- 1 Pain scale- 0   Review of systems- Review of Systems  Constitutional:  Negative  for chills, fever, malaise/fatigue and weight loss.  HENT:  Negative for congestion, ear discharge and nosebleeds.   Eyes:  Negative for blurred vision.   Respiratory:  Negative for cough, hemoptysis, sputum production, shortness of breath and wheezing.   Cardiovascular:  Negative for chest pain, palpitations, orthopnea and claudication.  Gastrointestinal:  Negative for abdominal pain, blood in stool, constipation, diarrhea, heartburn, melena, nausea and vomiting.  Genitourinary:  Negative for dysuria, flank pain, frequency, hematuria and urgency.  Musculoskeletal:  Negative for back pain, joint pain and myalgias.  Skin:  Positive for rash.  Neurological:  Negative for dizziness, tingling, focal weakness, seizures, weakness and headaches.  Endo/Heme/Allergies:  Does not bruise/bleed easily.  Psychiatric/Behavioral:  Negative for depression and suicidal ideas. The patient does not have insomnia.      Allergies  Allergen Reactions   Demerol [Meperidine] Nausea And Vomiting   Lipitor [Atorvastatin] Swelling   Sulfa Antibiotics Nausea And Vomiting and Rash     Past Medical History:  Diagnosis Date   Arthritis    Benign fibroma of prostate 08/23/2013   BPH (benign prostatic hyperplasia)    Calculus of kidney 08/23/2013   Glaucoma    no drops in 3 mo pressure good, pt denies glaucoma, eye pressure has been measuring alright.   History of kidney stones    HLD (hyperlipidemia)    HOH (hard of hearing)    Left Hearing Aid   HTN (hypertension) 12/26/2014   Hypertension    Hyponatremia 12/26/2014   Migraines    history of migraines when he was younger.   Restless leg syndrome    Sinus drainage    Skin cancer    Skin cancer    left hand 06/2019   Urothelial cancer ([place])    chemo tx's.   UTI (lower urinary tract infection) 12/26/2014   Vertigo      Past Surgical History:  Procedure Laterality Date   COLONOSCOPY     CYSTOSCOPY W/ RETROGRADES Right 01/30/2015   Procedure: CYSTOSCOPY WITH RETROGRADE PYELOGRAM;  Surgeon: [name], MD;  Location: [facility];  Service: Urology;  Laterality: Right;   CYSTOSCOPY W/ RETROGRADES  Bilateral 02/26/2016   Procedure: CYSTOSCOPY WITH RETROGRADE PYELOGRAM;  Surgeon: [name], MD;  Location: [facility];  Service: Urology;  Laterality: Bilateral;   CYSTOSCOPY W/ URETERAL STENT PLACEMENT Right 08/20/2015   Procedure: CYSTOSCOPY WITH RETROGRADE PYELOGRAM/POSSIBLE URETERAL STENT PLACEMENT/BLADDER BIOPSY;  Surgeon: [name], MD;  Location: [facility];  Service: Urology;  Laterality: Right;   CYSTOSCOPY W/ URETERAL STENT PLACEMENT Right 09/12/2015   Procedure: CYSTOSCOPY WITH STENT REPLACEMENT;  Surgeon: [name], MD;  Location: [facility];  Service: Urology;  Laterality: Right;   CYSTOSCOPY WITH BIOPSY Right 09/12/2015   Procedure: CYSTOSCOPY WITH BLADDER AND URETERAL BIOPSY;  Surgeon: [name], MD;  Location: [facility];  Service: Urology;  Laterality: Right;   CYSTOSCOPY WITH STENT PLACEMENT Right 01/30/2015   Procedure: CYSTOSCOPY WITH STENT PLACEMENT;  Surgeon: [name], MD;  Location: [facility];  Service: Urology;  Laterality: Right;   CYSTOSCOPY WITH STENT PLACEMENT Right 12/28/2017   Procedure: CYSTOSCOPY WITH STENT Exchange;  Surgeon: [name], MD;  Location: [facility];  Service: Urology;  Laterality: Right;   CYSTOSCOPY/URETEROSCOPY/HOLMIUM LASER/STENT PLACEMENT Right 08/19/2017   Procedure: CYSTOSCOPY/URETEROSCOPY/HOLMIUM LASER/STENT PLACEMENT;  Surgeon: [name], MD;  Location: [facility];  Service: Urology;  Laterality: Right;   EYE SURGERY Bilateral    Cataract Extraction with IOL   goiter removal     HOLMIUM LASER APPLICATION  N/A 08/20/2015   Procedure:  HOLMIUM LASER APPLICATION;  Surgeon: [name], MD;  Location: [facility];  Service: Urology;  Laterality: N/A;   PORTA CATH INSERTION N/A 09/23/2017   Procedure: PORTA CATH INSERTION;  Surgeon: Algernon Huxley, MD;  Location: [facility];  Service: Cardiovascular;  Laterality: N/A;   SPERMATOCELECTOMY     TONSILLECTOMY     TRANSURETHRAL RESECTION OF BLADDER TUMOR N/A 02/26/2016    Procedure: TRANSURETHRAL RESECTION OF BLADDER TUMOR (TURBT);  Surgeon: [name], MD;  Location: [facility];  Service: Urology;  Laterality: N/A;   TRANSURETHRAL RESECTION OF BLADDER TUMOR WITH MITOMYCIN-C N/A 09/12/2015   Procedure: TRANSURETHRAL RESECTION OF BLADDER TUMOR ;  Surgeon: [name], MD;  Location: [facility];  Service: Urology;  Laterality: N/A;   TRANSURETHRAL RESECTION OF BLADDER TUMOR WITH MITOMYCIN-C N/A 03/24/2016   Procedure: TRANSURETHRAL RESECTION OF BLADDER TUMOR WITH MITOMYCIN-C  (SMALL);  Surgeon: [name], MD;  Location: [facility];  Service: Urology;  Laterality: N/A;   URETERAL BIOPSY Right 08/19/2017   Procedure: Renal Mass BIOPSY;  Surgeon: [name], MD;  Location: [facility];  Service: Urology;  Laterality: Right;   URETEROSCOPY Right 01/30/2015   Procedure: URETEROSCOPY/ WITH BIOPSY AND CYTOLOGY BRUSHING;  Surgeon: [name], MD;  Location: [facility];  Service: Urology;  Laterality: Right;   URETEROSCOPY Right 08/20/2015   Procedure: URETEROSCOPY;  Surgeon: [name], MD;  Location: [facility];  Service: Urology;  Laterality: Right;   URETEROSCOPY Right 09/12/2015   Procedure: URETEROSCOPY;  Surgeon: [name], MD;  Location: [facility];  Service: Urology;  Laterality: Right;   URETEROSCOPY Right 02/26/2016   Procedure: URETEROSCOPY;  Surgeon: [name], MD;  Location: [facility];  Service: Urology;  Laterality: Right;    Social History   Socioeconomic History   Marital status: Married    Spouse name: Not on file   Number of children: Not on file   Years of education: Not on file   Highest education level: Not on file  Occupational History   Not on file  Tobacco Use   Smoking status: Never   Smokeless tobacco: Never  Vaping Use   Vaping Use: Never used  Substance and Sexual Activity   Alcohol use: No    Alcohol/week: 0.0 standard drinks   Drug use: No   Sexual activity: Not Currently  Other Topics Concern   Not on file   Social History Narrative   Not on file   Social Determinants of Health   Financial Resource Strain: Not on file  Food Insecurity: Not on file  Transportation Needs: Not on file  Physical Activity: Not on file  Stress: Not on file  Social Connections: Not on file  Intimate Partner Violence: Not on file    Family History  Problem Relation Age of Onset   Hypertension Mother    Hypertension Father    Prostate cancer Brother    Kidney disease Neg Hx    Kidney cancer Neg Hx    Bladder Cancer Neg Hx      Current Outpatient Medications:    acetaminophen (TYLENOL) 500 MG tablet, Take by mouth., Disp: , Rfl:    amLODipine (NORVASC) 5 MG tablet, Take 5 mg by mouth daily. , Disp: , Rfl:    benazepril-hydrochlorthiazide (LOTENSIN HCT) 20-12.5 MG tablet, Take 1 tablet by mouth daily. , Disp: , Rfl:    diazepam (VALIUM) 5 MG tablet, TAKE 1 TABLET (5 MG TOTAL) BY MOUTH EVERY 12 (TWELVE) HOURS AS  NEEDED FOR ANXIETY OR SLEEP, Disp: , Rfl:    docusate sodium (COLACE) 250 MG capsule, Take 250 mg by mouth daily., Disp: , Rfl:    fenofibrate micronized (LOFIBRA) 134 MG capsule, Take 134 mg by mouth daily before breakfast. , Disp: , Rfl:    folic acid (FOLVITE) 1 MG tablet, Take by mouth., Disp: , Rfl:    Multiple Vitamin (MULTIVITAMIN WITH MINERALS) TABS tablet, Take 1 tablet by mouth daily., Disp: , Rfl:    nystatin (MYCOSTATIN/NYSTOP) powder, Apply 1 application topically 3 (three) times daily. To under arms area that has rash, Disp: 30 g, Rfl: 0   polyethylene glycol (MIRALAX / GLYCOLAX) 17 g packet, Take 17 g by mouth daily., Disp: , Rfl:    prochlorperazine (COMPAZINE) 10 MG tablet, Take 1 tablet (10 mg total) by mouth every 6 (six) hours as needed for nausea or vomiting., Disp: 30 tablet, Rfl: 0   psyllium (METAMUCIL) 58.6 % packet, Take 1 packet by mouth daily., Disp: , Rfl:    senna (SENOKOT) 8.6 MG tablet, Take 1 tablet by mouth daily., Disp: , Rfl:    tamsulosin (FLOMAX) 0.4 MG CAPS  capsule, TAKE 1 CAPSULE BY MOUTH EVERY DAY, Disp: 90 capsule, Rfl: 3   camphor-menthol (SARNA) lotion, Apply 1 application topically as needed for itching. (Patient not taking: Reported on 08/03/2020), Disp: , Rfl:    HYDROcodone-acetaminophen (NORCO/VICODIN) 5-325 MG tablet, Take 1 tablet by mouth every 6 (six) hours as needed for severe pain. (Patient not taking: Reported on 07/05/2019), Disp: 30 tablet, Rfl: 0   LAGEVRIO 200 MG CAPS capsule, Take 4 capsules by mouth 2 (two) times daily. (Patient not taking: Reported on 01/14/2021), Disp: , Rfl:    triamcinolone cream (KENALOG) 0.1 %, Apply 1 application topically 2 (two) times daily. (Patient not taking: Reported on 01/14/2021), Disp: , Rfl:    urea (CARMOL) 10 % cream, Apply topically 3 (three) times daily. NOT THE HANDS UNTIL IT CLEARS UP, Disp: 71 g, Rfl: 0 No current facility-administered medications for this visit.  Facility-Administered Medications Ordered in Other Visits:    sodium chloride flush (NS) 0.9 % injection 10 mL, 10 mL, Intravenous, Once, Sindy Guadeloupe, MD   sodium chloride flush (NS) 0.9 % injection 10 mL, 10 mL, Intravenous, PRN, Sindy Guadeloupe, MD   sodium chloride flush (NS) 0.9 % injection 10 mL, 10 mL, Intravenous, PRN, Sindy Guadeloupe, MD, 10 mL at 02/04/21 0929  Physical exam:  Vitals:   02/04/21 0900  BP: (!) 143/79  Pulse: 85  Resp: 18  Temp: 97.6 F (36.4 C)  TempSrc: Tympanic  SpO2: 95%  Weight: 197 lb 4.8 oz (89.5 kg)   Physical Exam Cardiovascular:     Rate and Rhythm: Normal rate and regular rhythm.     Heart sounds: Normal heart sounds.  Pulmonary:     Effort: Pulmonary effort is normal.     Breath sounds: Normal breath sounds.  Abdominal:     General: Bowel sounds are normal.     Palpations: Abdomen is soft.  Skin:    General: Skin is warm and dry.     Comments: Exfoliating rash noted over bilateral palms.  No other evidence of skin rash.  Neurological:     Mental Status: He is alert and  oriented to person, place, and time.     CMP Latest Ref Rng & Units 02/04/2021  Glucose 70 - 99 mg/dL 131(H)  BUN 8 - 23 mg/dL 20  Creatinine 0.61 -  1.24 mg/dL 1.00  Sodium 135 - 145 mmol/L 134(L)  Potassium 3.5 - 5.1 mmol/L 3.7  Chloride 98 - 111 mmol/L 99  CO2 22 - 32 mmol/L 29  Calcium 8.9 - 10.3 mg/dL 8.6(L)  Total Protein 6.5 - 8.1 g/dL 6.5  Total Bilirubin 0.3 - 1.2 mg/dL 0.5  Alkaline Phos 38 - 126 U/L 50  AST 15 - 41 U/L 34  ALT 0 - 44 U/L 23   CBC Latest Ref Rng & Units 02/04/2021  WBC 4.0 - 10.5 K/uL 6.0  Hemoglobin 13.0 - 17.0 g/dL 13.2  Hematocrit 39.0 - 52.0 % 38.1(L)  Platelets 150 - 400 K/uL 330    Assessment and plan- Patient is a 87 y.o. male with metastatic urothelial carcinoma with metastases to lungs and pelvic adenopathy. He is here for on treatment assessment prior to cycle 6-day 1 of Padcev  I will add urea cream to his palmar rash.  Other than that he does not have any other evidence of rash which he has had before when he received Padcev.  I am hoping we can continue Padcev without any significant toxicity since he responded to this regiment well 18 months ago.  Counts are otherwise okay to proceed with cycle 6-day 1 of Padcev chemotherapy today.  He is receiving it at a reduced dose of 1 mg per metered square at 2 weeks on 1 week off instead of 3 weeks on 1 week off.  Return to clinic in 3 weeks.  See covering NP.  Port labs CBC with differential CMP and gets Padcev on day 1 and day 8  Return to clinic in 6 weeks and see Dr. Janese Banks.  Port labs CBC with differential, CMP and gets Padcev on day 1 and day 8   Visit Diagnosis 1. Encounter for antineoplastic chemotherapy   2. Drug-induced skin rash   3. 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 Randa Evens, MD, MPH [facility] at [facility] [phone] 02/04/2021 4:33 PM

## 2021-02-04 NOTE — Patient Instructions (Signed)
[facility]   Discharge Instructions: Thank you for choosing [facility] to provide your oncology and hematology care.  If you have a lab appointment with the [facility], please go directly to the [facility] and check in at the registration area.  Wear comfortable clothing and clothing appropriate for easy access to any Portacath or PICC line.   We strive to give you quality time with your provider. You may need to reschedule your appointment if you arrive late (15 or more minutes).  Arriving late affects you and other patients whose appointments are after yours.  Also, if you miss three or more appointments without notifying the office, you may be dismissed from the clinic at the provider's discretion.      For prescription refill requests, have your pharmacy contact our office and allow 72 hours for refills to be completed.    Today you received the following chemotherapy and/or immunotherapy agents: Padcev.      To help prevent nausea and vomiting after your treatment, we encourage you to take your nausea medication as directed.  BELOW ARE SYMPTOMS THAT SHOULD BE REPORTED IMMEDIATELY: *FEVER GREATER THAN 100.4 F (38 C) OR HIGHER *CHILLS OR SWEATING *NAUSEA AND VOMITING THAT IS NOT CONTROLLED WITH YOUR NAUSEA MEDICATION *UNUSUAL SHORTNESS OF BREATH *UNUSUAL BRUISING OR BLEEDING *URINARY PROBLEMS (pain or burning when urinating, or frequent urination) *BOWEL PROBLEMS (unusual diarrhea, constipation, pain near the anus) TENDERNESS IN MOUTH AND THROAT WITH OR WITHOUT PRESENCE OF ULCERS (sore throat, sores in mouth, or a toothache) UNUSUAL RASH, SWELLING OR PAIN  UNUSUAL VAGINAL DISCHARGE OR ITCHING   Items with * indicate a potential emergency and should be followed up as soon as possible or go to the Emergency Department if any problems should occur.  Please show the CHEMOTHERAPY ALERT CARD or IMMUNOTHERAPY ALERT CARD at check-in  to the Emergency Department and triage nurse.  Should you have questions after your visit or need to cancel or reschedule your appointment, please contact [facility]  [phone] and follow the prompts.  Office hours are 8:00 a.m. to 4:30 p.m. Monday - Friday. Please note that voicemails left after 4:00 p.m. may not be returned until the following business day.  We are closed weekends and major holidays. You have access to a nurse at all times for urgent questions. Please call the main number to the clinic [phone] and follow the prompts.  For any non-urgent questions, you may also contact your provider using MyChart. We now offer e-Visits for anyone 85 and older to request care online for non-urgent symptoms. For details visit [email or web address].   Also download the MyChart app! Go to the app store, search "MyChart", open the app, select [facility], and log in with your MyChart username and password.  Due to Covid, a mask is required upon entering the hospital/clinic. If you do not have a mask, one will be given to you upon arrival. For doctor visits, patients may have 1 support person aged 85 or older with them. For treatment visits, patients cannot have anyone with them due to current Covid guidelines and our immunocompromised population.

## 2021-02-11 ENCOUNTER — Inpatient Hospital Stay: Payer: PPO

## 2021-02-11 ENCOUNTER — Other Ambulatory Visit: Payer: Self-pay

## 2021-02-11 ENCOUNTER — Inpatient Hospital Stay: Payer: PPO | Attending: Oncology

## 2021-02-11 VITALS — BP 139/77 | HR 90 | Temp 96.5°F | Resp 20 | Wt 194.5 lb

## 2021-02-11 DIAGNOSIS — Z87440 Personal history of urinary (tract) infections: Secondary | ICD-10-CM | POA: Diagnosis not present

## 2021-02-11 DIAGNOSIS — Z882 Allergy status to sulfonamides status: Secondary | ICD-10-CM | POA: Diagnosis not present

## 2021-02-11 DIAGNOSIS — Z8249 Family history of ischemic heart disease and other diseases of the circulatory system: Secondary | ICD-10-CM | POA: Insufficient documentation

## 2021-02-11 DIAGNOSIS — I2510 Atherosclerotic heart disease of native coronary artery without angina pectoris: Secondary | ICD-10-CM | POA: Insufficient documentation

## 2021-02-11 DIAGNOSIS — C7951 Secondary malignant neoplasm of bone: Secondary | ICD-10-CM | POA: Insufficient documentation

## 2021-02-11 DIAGNOSIS — Z79899 Other long term (current) drug therapy: Secondary | ICD-10-CM | POA: Insufficient documentation

## 2021-02-11 DIAGNOSIS — C7801 Secondary malignant neoplasm of right lung: Secondary | ICD-10-CM | POA: Diagnosis not present

## 2021-02-11 DIAGNOSIS — K5900 Constipation, unspecified: Secondary | ICD-10-CM | POA: Diagnosis not present

## 2021-02-11 DIAGNOSIS — Z885 Allergy status to narcotic agent status: Secondary | ICD-10-CM | POA: Diagnosis not present

## 2021-02-11 DIAGNOSIS — N400 Enlarged prostate without lower urinary tract symptoms: Secondary | ICD-10-CM | POA: Diagnosis not present

## 2021-02-11 DIAGNOSIS — K8020 Calculus of gallbladder without cholecystitis without obstruction: Secondary | ICD-10-CM | POA: Insufficient documentation

## 2021-02-11 DIAGNOSIS — I700 Atherosclerosis of aorta: Secondary | ICD-10-CM | POA: Insufficient documentation

## 2021-02-11 DIAGNOSIS — Z87442 Personal history of urinary calculi: Secondary | ICD-10-CM | POA: Diagnosis not present

## 2021-02-11 DIAGNOSIS — Z5112 Encounter for antineoplastic immunotherapy: Secondary | ICD-10-CM | POA: Insufficient documentation

## 2021-02-11 DIAGNOSIS — C7802 Secondary malignant neoplasm of left lung: Secondary | ICD-10-CM | POA: Insufficient documentation

## 2021-02-11 DIAGNOSIS — R5383 Other fatigue: Secondary | ICD-10-CM | POA: Diagnosis not present

## 2021-02-11 DIAGNOSIS — I10 Essential (primary) hypertension: Secondary | ICD-10-CM | POA: Insufficient documentation

## 2021-02-11 DIAGNOSIS — Z8042 Family history of malignant neoplasm of prostate: Secondary | ICD-10-CM | POA: Insufficient documentation

## 2021-02-11 DIAGNOSIS — C669 Malignant neoplasm of unspecified ureter: Secondary | ICD-10-CM

## 2021-02-11 DIAGNOSIS — Z85828 Personal history of other malignant neoplasm of skin: Secondary | ICD-10-CM | POA: Diagnosis not present

## 2021-02-11 DIAGNOSIS — L309 Dermatitis, unspecified: Secondary | ICD-10-CM | POA: Insufficient documentation

## 2021-02-11 DIAGNOSIS — C679 Malignant neoplasm of bladder, unspecified: Secondary | ICD-10-CM | POA: Diagnosis not present

## 2021-02-11 LAB — CBC WITH DIFFERENTIAL/PLATELET
Abs Immature Granulocytes: 0.02 10*3/uL (ref 0.00–0.07)
Basophils Absolute: 0.1 10*3/uL (ref 0.0–0.1)
Basophils Relative: 1 %
Eosinophils Absolute: 0.3 10*3/uL (ref 0.0–0.5)
Eosinophils Relative: 5 %
HCT: 39.3 % (ref 39.0–52.0)
Hemoglobin: 13.5 g/dL (ref 13.0–17.0)
Immature Granulocytes: 0 %
Lymphocytes Relative: 25 %
Lymphs Abs: 1.3 10*3/uL (ref 0.7–4.0)
MCH: 30.1 pg (ref 26.0–34.0)
MCHC: 34.4 g/dL (ref 30.0–36.0)
MCV: 87.7 fL (ref 80.0–100.0)
Monocytes Absolute: 0.6 10*3/uL (ref 0.1–1.0)
Monocytes Relative: 11 %
Neutro Abs: 3.1 10*3/uL (ref 1.7–7.7)
Neutrophils Relative %: 58 %
Platelets: 281 10*3/uL (ref 150–400)
RBC: 4.48 MIL/uL (ref 4.22–5.81)
RDW: 13 % (ref 11.5–15.5)
WBC: 5.4 10*3/uL (ref 4.0–10.5)
nRBC: 0 % (ref 0.0–0.2)

## 2021-02-11 LAB — COMPREHENSIVE METABOLIC PANEL
ALT: 22 U/L (ref 0–44)
AST: 36 U/L (ref 15–41)
Albumin: 3.6 g/dL (ref 3.5–5.0)
Alkaline Phosphatase: 48 U/L (ref 38–126)
Anion gap: 9 (ref 5–15)
BUN: 16 mg/dL (ref 8–23)
CO2: 28 mmol/L (ref 22–32)
Calcium: 9 mg/dL (ref 8.9–10.3)
Chloride: 98 mmol/L (ref 98–111)
Creatinine, Ser: 1.04 mg/dL (ref 0.61–1.24)
GFR, Estimated: >60 mL/min (ref >60)
Glucose, Bld: 181 mg/dL — ABNORMAL HIGH (ref 70–99)
Potassium: 3.6 mmol/L (ref 3.5–5.1)
Sodium: 135 mmol/L (ref 135–145)
Total Bilirubin: 0.6 mg/dL (ref 0.3–1.2)
Total Protein: 6.6 g/dL (ref 6.5–8.1)

## 2021-02-11 MED ORDER — SODIUM CHLORIDE 0.9 % IV SOLN
90.0000 mg | Freq: Once | INTRAVENOUS | Status: AC
  Administered 2021-02-11: 90 mg via INTRAVENOUS
  Filled 2021-02-11: qty 9

## 2021-02-11 MED ORDER — HEPARIN SOD (PORK) LOCK FLUSH 100 UNIT/ML IV SOLN
500.0000 [IU] | Freq: Once | INTRAVENOUS | Status: AC | PRN
  Administered 2021-02-11: 500 [IU]
  Filled 2021-02-11: qty 5

## 2021-02-11 MED ORDER — SODIUM CHLORIDE 0.9 % IV SOLN
Freq: Once | INTRAVENOUS | Status: AC
  Filled 2021-02-11: qty 250

## 2021-02-11 MED ORDER — PALONOSETRON HCL INJECTION 0.25 MG/5ML
0.2500 mg | Freq: Once | INTRAVENOUS | Status: AC
  Administered 2021-02-11: 0.25 mg via INTRAVENOUS
  Filled 2021-02-11: qty 5

## 2021-02-11 MED ORDER — SODIUM CHLORIDE 0.9 % IV SOLN
10.0000 mg | Freq: Once | INTRAVENOUS | Status: AC
  Administered 2021-02-11: 10 mg via INTRAVENOUS
  Filled 2021-02-11: qty 1

## 2021-02-11 MED ORDER — HEPARIN SOD (PORK) LOCK FLUSH 100 UNIT/ML IV SOLN
INTRAVENOUS | Status: AC
  Filled 2021-02-11: qty 5

## 2021-02-11 NOTE — Patient Instructions (Signed)
[facility] CANCER CTR AT [place]  Discharge Instructions: Thank you for choosing [facility] to provide your oncology and hematology care.  If you have a lab appointment with the [facility], please go directly to the [facility] and check in at the registration area.  Wear comfortable clothing and clothing appropriate for easy access to any Portacath or PICC line.   We strive to give you quality time with your provider. You may need to reschedule your appointment if you arrive late (15 or more minutes).  Arriving late affects you and other patients whose appointments are after yours.  Also, if you miss three or more appointments without notifying the office, you may be dismissed from the clinic at the provider's discretion.      For prescription refill requests, have your pharmacy contact our office and allow 72 hours for refills to be completed.    Today you received the following chemotherapy and/or immunotherapy agents: Padcev      To help prevent nausea and vomiting after your treatment, we encourage you to take your nausea medication as directed.  BELOW ARE SYMPTOMS THAT SHOULD BE REPORTED IMMEDIATELY: *FEVER GREATER THAN 100.4 F (38 C) OR HIGHER *CHILLS OR SWEATING *NAUSEA AND VOMITING THAT IS NOT CONTROLLED WITH YOUR NAUSEA MEDICATION *UNUSUAL SHORTNESS OF BREATH *UNUSUAL BRUISING OR BLEEDING *URINARY PROBLEMS (pain or burning when urinating, or frequent urination) *BOWEL PROBLEMS (unusual diarrhea, constipation, pain near the anus) TENDERNESS IN MOUTH AND THROAT WITH OR WITHOUT PRESENCE OF ULCERS (sore throat, sores in mouth, or a toothache) UNUSUAL RASH, SWELLING OR PAIN  UNUSUAL VAGINAL DISCHARGE OR ITCHING   Items with * indicate a potential emergency and should be followed up as soon as possible or go to the Emergency Department if any problems should occur.  Please show the CHEMOTHERAPY ALERT CARD or IMMUNOTHERAPY ALERT CARD at check-in to the  Emergency Department and triage nurse.  Should you have questions after your visit or need to cancel or reschedule your appointment, please contact [facility] CANCER [place] AT [place]  [phone] and follow the prompts.  Office hours are 8:00 a.m. to 4:30 p.m. Monday - Friday. Please note that voicemails left after 4:00 p.m. may not be returned until the following business day.  We are closed weekends and major holidays. You have access to a nurse at all times for urgent questions. Please call the main number to the clinic [phone] and follow the prompts.  For any non-urgent questions, you may also contact your provider using MyChart. We now offer e-Visits for anyone 70 and older to request care online for non-urgent symptoms. For details visit [email or web address].   Also download the MyChart app! Go to the app store, search "MyChart", open the app, select [facility], and log in with your MyChart username and password.  Due to Covid, a mask is required upon entering the hospital/clinic. If you do not have a mask, one will be given to you upon arrival. For doctor visits, patients may have 1 support person aged 20 or older with them. For treatment visits, patients cannot have anyone with them due to current Covid guidelines and our immunocompromised population. Enfortumab vedotin injection What is this medication? ENFORTUMAB VEDOTIN (en FORT ue mab ve DOE tin) is a chemotherapy medicine and monoclonal antibody. It treats urothelial cancer. This medicine may be used for other purposes; ask your health care provider or pharmacist if you have questions. COMMON BRAND NAME(S): PADCEV What should I tell my care team  before I take this medication? They need to know if you have any of these conditions: diabetes (high blood sugar) eye disease, vision problems lung disease skin conditions or sensitivity tingling of the fingers or toes, or other nerve disorder an unusual or allergic  reaction to enfortumab vedotin, other medicines, foods, dyes or preservatives pregnant or trying to get pregnant breast-feeding How should I use this medication? This medicine is injected into a vein. It is given by a health care provider in a hospital or clinic setting. Talk to your health care provider about the use of this medicine in children. Special care may be needed. Overdosage: If you think you have taken too much of this medicine contact a poison control center or emergency room at once. NOTE: This medicine is only for you. Do not share this medicine with others. What if I miss a dose? Keep appointments for follow-up doses. It is important not to miss your dose. Call your doctor or health care provider if you are unable to keep an appointment. What may interact with this medication? This medicine may also interact with the following medications: certain antivirals for HIV certain medicines for fungal infections like ketoconazole, itraconazole, or posaconazole clarithromycin grapefruit juice mifepristone telithromycin This list may not describe all possible interactions. Give your health care provider a list of all the medicines, herbs, non-prescription drugs, or dietary supplements you use. Also tell them if you smoke, drink alcohol, or use illegal drugs. Some items may interact with your medicine. What should I watch for while using this medication? This medicine may make you feel generally unwell. This is not uncommon as chemotherapy can affect healthy cells as well as cancer cells. Report any side effects. Continue your course of treatment even though you feel ill unless your health care provider tells you to stop. Your condition will be monitored carefully while you are receiving this medicine. Do not become pregnant while taking this medicine or for 2 months after stopping it. Women should inform their health care provider if they wish to become pregnant or think they might be  pregnant. Men should not father a child while taking this medicine and for 4 months after stopping it. There is potential for serious side effects to an unborn child. Talk to your health care provider for more information. Do not breast-feed an infant while taking this medicine or for at least 3 weeks after stopping it. This medicine may make it more difficult to father a child. Talk to your health care provider if you are concerned about your fertility. This medicine may increase blood sugar. Ask your health care provider if changes in diet or medicines are needed if you have diabetes. This medicine can cause a serious condition in which there is too much acid in your blood. If you develop nausea, vomiting, stomach pain, unusual tiredness, or breathing problems, stop taking this medicine and call your health care provider right away. If possible, use a ketone dipstick to check for ketones in your urine. This medicine may cause dry eyes and blurred vision. If you wear contact lenses, you may feel some discomfort. Lubricating eye drops may help. See your health care provider if the problem does not go away or is severe. Tell your health care provider right away if you have any change in your eyesight. This medicine may increase your risk of getting an infection. Call your health care provider for advice if you get a fever, chills, or sore throat, or other symptoms of  a cold or flu. Do not treat yourself. Try to avoid being around people who are sick. This medicine may cause serious skin reactions. They can happen weeks to months after starting the medicine. Contact your health care provider right away if you notice fevers or flu-like symptoms with a rash. The rash may be red or purple and then turn into blisters or peeling of the skin. Or, you might notice a red rash with swelling of the face, lips or lymph nodes in your neck or under your arms. What side effects may I notice from receiving this  medication? Side effects that you should report to your doctor or health care professional as soon as possible: allergic reactions (skin rash, itching or hives; swelling of the face, lips, or tongue) blurred vision changes in vision cough dry eyes high blood sugar (increased hunger, thirst or urination; unusually weak or tired, blurry vision) infection (fever, chills, cough, sore throat, pain or trouble passing urine) pain, tingling, numbness in the hands or feet redness, blistering, peeling, bleeding, swelling, or loosening of the skin on the palms of your hands or soles of your feet or inside the mouth trouble breathing Side effects that usually do not require medical attention (report these to your doctor or health care professional if they continue or are bothersome): changes in taste diarrhea dry skin hair loss loss of appetite nausea, vomiting weak or tired This list may not describe all possible side effects. Call your doctor for medical advice about side effects. You may report side effects to FDA at 1-800-FDA-1088. Where should I keep my medication? This medicine is given in a hospital or clinic and will not be stored at home. NOTE: This sheet is a summary. It may not cover all possible information. If you have questions about this medicine, talk to your doctor, pharmacist, or health care provider.  2022 Elsevier/Gold Standard (2019-10-05 00:00:00)

## 2021-02-25 ENCOUNTER — Inpatient Hospital Stay: Payer: PPO

## 2021-02-25 ENCOUNTER — Encounter: Payer: Self-pay | Admitting: Oncology

## 2021-02-25 ENCOUNTER — Inpatient Hospital Stay: Payer: PPO | Admitting: Oncology

## 2021-02-25 ENCOUNTER — Other Ambulatory Visit: Payer: Self-pay

## 2021-02-25 VITALS — BP 146/83 | HR 88 | Temp 97.5°F | Wt 197.3 lb

## 2021-02-25 DIAGNOSIS — R21 Rash and other nonspecific skin eruption: Secondary | ICD-10-CM

## 2021-02-25 DIAGNOSIS — K5900 Constipation, unspecified: Secondary | ICD-10-CM

## 2021-02-25 DIAGNOSIS — C669 Malignant neoplasm of unspecified ureter: Secondary | ICD-10-CM | POA: Diagnosis not present

## 2021-02-25 DIAGNOSIS — Z5112 Encounter for antineoplastic immunotherapy: Secondary | ICD-10-CM | POA: Diagnosis not present

## 2021-02-25 LAB — CBC WITH DIFFERENTIAL/PLATELET
Abs Immature Granulocytes: 0.04 10*3/uL (ref 0.00–0.07)
Basophils Absolute: 0.1 10*3/uL (ref 0.0–0.1)
Basophils Relative: 1 %
Eosinophils Absolute: 0.2 10*3/uL (ref 0.0–0.5)
Eosinophils Relative: 3 %
HCT: 39.6 % (ref 39.0–52.0)
Hemoglobin: 13.5 g/dL (ref 13.0–17.0)
Immature Granulocytes: 1 %
Lymphocytes Relative: 19 %
Lymphs Abs: 1.3 10*3/uL (ref 0.7–4.0)
MCH: 29.4 pg (ref 26.0–34.0)
MCHC: 34.1 g/dL (ref 30.0–36.0)
MCV: 86.3 fL (ref 80.0–100.0)
Monocytes Absolute: 0.8 10*3/uL (ref 0.1–1.0)
Monocytes Relative: 11 %
Neutro Abs: 4.5 10*3/uL (ref 1.7–7.7)
Neutrophils Relative %: 65 %
Platelets: 307 10*3/uL (ref 150–400)
RBC: 4.59 MIL/uL (ref 4.22–5.81)
RDW: 14 % (ref 11.5–15.5)
WBC: 6.9 10*3/uL (ref 4.0–10.5)
nRBC: 0 % (ref 0.0–0.2)

## 2021-02-25 LAB — COMPREHENSIVE METABOLIC PANEL
ALT: 24 U/L (ref 0–44)
AST: 32 U/L (ref 15–41)
Albumin: 3.8 g/dL (ref 3.5–5.0)
Alkaline Phosphatase: 45 U/L (ref 38–126)
Anion gap: 11 (ref 5–15)
BUN: 19 mg/dL (ref 8–23)
CO2: 26 mmol/L (ref 22–32)
Calcium: 9 mg/dL (ref 8.9–10.3)
Chloride: 97 mmol/L — ABNORMAL LOW (ref 98–111)
Creatinine, Ser: 0.9 mg/dL (ref 0.61–1.24)
GFR, Estimated: >60 mL/min (ref >60)
Glucose, Bld: 109 mg/dL — ABNORMAL HIGH (ref 70–99)
Potassium: 3.7 mmol/L (ref 3.5–5.1)
Sodium: 134 mmol/L — ABNORMAL LOW (ref 135–145)
Total Bilirubin: 0.6 mg/dL (ref 0.3–1.2)
Total Protein: 6.8 g/dL (ref 6.5–8.1)

## 2021-02-25 MED ORDER — PALONOSETRON HCL INJECTION 0.25 MG/5ML
0.2500 mg | Freq: Once | INTRAVENOUS | Status: AC
  Administered 2021-02-25: 11:00:00 0.25 mg via INTRAVENOUS
  Filled 2021-02-25: qty 5

## 2021-02-25 MED ORDER — SODIUM CHLORIDE 0.9 % IV SOLN
90.0000 mg | Freq: Once | INTRAVENOUS | Status: AC
  Administered 2021-02-25: 12:00:00 90 mg via INTRAVENOUS
  Filled 2021-02-25: qty 9

## 2021-02-25 MED ORDER — SODIUM CHLORIDE 0.9 % IV SOLN
Freq: Once | INTRAVENOUS | Status: AC
Start: 2021-02-25 — End: 2021-02-25
  Filled 2021-02-25: qty 250

## 2021-02-25 MED ORDER — HEPARIN SOD (PORK) LOCK FLUSH 100 UNIT/ML IV SOLN
500.0000 [IU] | Freq: Once | INTRAVENOUS | Status: AC | PRN
  Administered 2021-02-25: 12:00:00 500 [IU]
  Filled 2021-02-25: qty 5

## 2021-02-25 MED ORDER — SODIUM CHLORIDE 0.9 % IV SOLN
10.0000 mg | Freq: Once | INTRAVENOUS | Status: AC
  Administered 2021-02-25: 11:00:00 10 mg via INTRAVENOUS
  Filled 2021-02-25: qty 10

## 2021-02-25 MED ORDER — SODIUM CHLORIDE 0.9% FLUSH
10.0000 mL | Freq: Once | INTRAVENOUS | Status: AC
  Administered 2021-02-25: 10:00:00 10 mL via INTRAVENOUS
  Filled 2021-02-25: qty 10

## 2021-02-25 NOTE — Patient Instructions (Signed)
[facility] CANCER CTR AT [place]  Discharge Instructions: Thank you for choosing [facility] to provide your oncology and hematology care.  If you have a lab appointment with the [facility], please go directly to the [facility] and check in at the registration area.  Wear comfortable clothing and clothing appropriate for easy access to any Portacath or PICC line.   We strive to give you quality time with your provider. You may need to reschedule your appointment if you arrive late (15 or more minutes).  Arriving late affects you and other patients whose appointments are after yours.  Also, if you miss three or more appointments without notifying the office, you may be dismissed from the clinic at the providers discretion.      For prescription refill requests, have your pharmacy contact our office and allow 72 hours for refills to be completed.    Today you received the following chemotherapy and/or immunotherapy agents: Padcev      To help prevent nausea and vomiting after your treatment, we encourage you to take your nausea medication as directed.  BELOW ARE SYMPTOMS THAT SHOULD BE REPORTED IMMEDIATELY: *FEVER GREATER THAN 100.4 F (38 C) OR HIGHER *CHILLS OR SWEATING *NAUSEA AND VOMITING THAT IS NOT CONTROLLED WITH YOUR NAUSEA MEDICATION *UNUSUAL SHORTNESS OF BREATH *UNUSUAL BRUISING OR BLEEDING *URINARY PROBLEMS (pain or burning when urinating, or frequent urination) *BOWEL PROBLEMS (unusual diarrhea, constipation, pain near the anus) TENDERNESS IN MOUTH AND THROAT WITH OR WITHOUT PRESENCE OF ULCERS (sore throat, sores in mouth, or a toothache) UNUSUAL RASH, SWELLING OR PAIN  UNUSUAL VAGINAL DISCHARGE OR ITCHING   Items with * indicate a potential emergency and should be followed up as soon as possible or go to the Emergency Department if any problems should occur.  Please show the CHEMOTHERAPY ALERT CARD or IMMUNOTHERAPY ALERT CARD at check-in to the  Emergency Department and triage nurse.  Should you have questions after your visit or need to cancel or reschedule your appointment, please contact [facility] CANCER [place] AT [place]  [phone] and follow the prompts.  Office hours are 8:00 a.m. to 4:30 p.m. Monday - Friday. Please note that voicemails left after 4:00 p.m. may not be returned until the following business day.  We are closed weekends and major holidays. You have access to a nurse at all times for urgent questions. Please call the main number to the clinic [phone] and follow the prompts.  For any non-urgent questions, you may also contact your provider using MyChart. We now offer e-Visits for anyone 23 and older to request care online for non-urgent symptoms. For details visit [email or web address].   Also download the MyChart app! Go to the app store, search "MyChart", open the app, select [facility], and log in with your MyChart username and password.  Due to Covid, a mask is required upon entering the hospital/clinic. If you do not have a mask, one will be given to you upon arrival. For doctor visits, patients may have 1 support person aged 72 or older with them. For treatment visits, patients cannot have anyone with them due to current Covid guidelines and our immunocompromised population. Enfortumab vedotin injection What is this medication? ENFORTUMAB VEDOTIN (en FORT ue mab ve DOE tin) is a chemotherapy medicine and monoclonal antibody. It treats urothelial cancer. This medicine may be used for other purposes; ask your health care provider or pharmacist if you have questions. COMMON BRAND NAME(S): PADCEV What should I tell my care team  before I take this medication? They need to know if you have any of these conditions: diabetes (high blood sugar) eye disease, vision problems lung disease skin conditions or sensitivity tingling of the fingers or toes, or other nerve disorder an unusual or allergic  reaction to enfortumab vedotin, other medicines, foods, dyes or preservatives pregnant or trying to get pregnant breast-feeding How should I use this medication? This medicine is injected into a vein. It is given by a health care provider in a hospital or clinic setting. Talk to your health care provider about the use of this medicine in children. Special care may be needed. Overdosage: If you think you have taken too much of this medicine contact a poison control center or emergency room at once. NOTE: This medicine is only for you. Do not share this medicine with others. What if I miss a dose? Keep appointments for follow-up doses. It is important not to miss your dose. Call your doctor or health care provider if you are unable to keep an appointment. What may interact with this medication? This medicine may also interact with the following medications: certain antivirals for HIV certain medicines for fungal infections like ketoconazole, itraconazole, or posaconazole clarithromycin grapefruit juice mifepristone telithromycin This list may not describe all possible interactions. Give your health care provider a list of all the medicines, herbs, non-prescription drugs, or dietary supplements you use. Also tell them if you smoke, drink alcohol, or use illegal drugs. Some items may interact with your medicine. What should I watch for while using this medication? This medicine may make you feel generally unwell. This is not uncommon as chemotherapy can affect healthy cells as well as cancer cells. Report any side effects. Continue your course of treatment even though you feel ill unless your health care provider tells you to stop. Your condition will be monitored carefully while you are receiving this medicine. Do not become pregnant while taking this medicine or for 2 months after stopping it. Women should inform their health care provider if they wish to become pregnant or think they might be  pregnant. Men should not father a child while taking this medicine and for 4 months after stopping it. There is potential for serious side effects to an unborn child. Talk to your health care provider for more information. Do not breast-feed an infant while taking this medicine or for at least 3 weeks after stopping it. This medicine may make it more difficult to father a child. Talk to your health care provider if you are concerned about your fertility. This medicine may increase blood sugar. Ask your health care provider if changes in diet or medicines are needed if you have diabetes. This medicine can cause a serious condition in which there is too much acid in your blood. If you develop nausea, vomiting, stomach pain, unusual tiredness, or breathing problems, stop taking this medicine and call your health care provider right away. If possible, use a ketone dipstick to check for ketones in your urine. This medicine may cause dry eyes and blurred vision. If you wear contact lenses, you may feel some discomfort. Lubricating eye drops may help. See your health care provider if the problem does not go away or is severe. Tell your health care provider right away if you have any change in your eyesight. This medicine may increase your risk of getting an infection. Call your health care provider for advice if you get a fever, chills, or sore throat, or other symptoms of  a cold or flu. Do not treat yourself. Try to avoid being around people who are sick. This medicine may cause serious skin reactions. They can happen weeks to months after starting the medicine. Contact your health care provider right away if you notice fevers or flu-like symptoms with a rash. The rash may be red or purple and then turn into blisters or peeling of the skin. Or, you might notice a red rash with swelling of the face, lips or lymph nodes in your neck or under your arms. What side effects may I notice from receiving this  medication? Side effects that you should report to your doctor or health care professional as soon as possible: allergic reactions (skin rash, itching or hives; swelling of the face, lips, or tongue) blurred vision changes in vision cough dry eyes high blood sugar (increased hunger, thirst or urination; unusually weak or tired, blurry vision) infection (fever, chills, cough, sore throat, pain or trouble passing urine) pain, tingling, numbness in the hands or feet redness, blistering, peeling, bleeding, swelling, or loosening of the skin on the palms of your hands or soles of your feet or inside the mouth trouble breathing Side effects that usually do not require medical attention (report these to your doctor or health care professional if they continue or are bothersome): changes in taste diarrhea dry skin hair loss loss of appetite nausea, vomiting weak or tired This list may not describe all possible side effects. Call your doctor for medical advice about side effects. You may report side effects to FDA at 1-800-FDA-1088. Where should I keep my medication? This medicine is given in a hospital or clinic and will not be stored at home. NOTE: This sheet is a summary. It may not cover all possible information. If you have questions about this medicine, talk to your doctor, pharmacist, or health care provider.  2022 Elsevier/Gold Standard (2019-10-05 00:00:00)

## 2021-02-25 NOTE — Progress Notes (Signed)
Hematology/Oncology Consult note [facility]  [phone] [phone]  Patient Care Team: Idelle Crouch, MD as PCP - General (Internal Medicine) Sindy Guadeloupe, MD as Consulting Physician (Hematology and Oncology) Sindy Guadeloupe, MD as Consulting Physician (Hematology and Oncology) Sindy Guadeloupe, MD as Consulting Physician (Hematology and Oncology)   Name of the patient: Anthony Gonzales  [id number]  1931/12/02   Date of visit: 02/25/21  Diagnosis- Metastatic urothelial carcinoma with possible mets to the obturator node. Indeterminate hilar lymph nodes and LUL lung nodule currently in remission   Chief complaint/ Reason for visit-on treatment assessment prior to cycle 7-day 1 of Padcev  Heme/Onc history: patient is a 87 year old male with past medical history significant for hypertension and long-standing history of superficial bladder cancer for which he sees [name].  He has undergone TURBT as well as ureteroscopy since 2017 along with mitomycin as well in the past.  Most recently he underwent CT abdomen on 08/06/2017 which showed a soft tissue mass in the right renal pelvis concerning for upper tract urothelial neoplasm.  Soft tissue fullness at the right ureterovesical junction with proximal right hydroureteronephrosis.  Several millimeter attenuation lesion in the pancreatic head.   He underwent diagnostic ureteroscopy and was found to have 2 high-grade lesions within his right kidney.  There was a nodular high-grade appearing lesion in the right anterior renal pelvis.  There was also a second ureteral tumor fungating from the right ureteral orifice extending into the distal ureter also consistent with high-grade invasive urothelial carcinoma.  Muscle invasion could not be assessed.  He also underwent a CT chest which showed a rounded nodule in the right upper lobe measuring 14 mm concerning for metastases.  2 other 3 mm lesions were  also noted in the left upper lobe likely benign   Plan initially was neoadjuvant chemotherapy followed by possible surgery but given the presence of lung lesion patient has been referred to oncology for the same.   PET/CT on 09/21/17 showed: IMPRESSION: 1. Hypermetabolic right obturator lymph node is most indicative of metastatic disease. 2. Mildly hypermetabolic left hilar lymph nodes are nonspecific. Continued attention on follow-up exams is warranted. 3. Right upper lobe pulmonary nodule shows metabolism after just above blood pool and is therefore indeterminate. Continued attention on follow-up exams is warranted. 4. Aortic atherosclerosis (ICD10-170.0). Coronary artery calcification. 5. Cholelithiasis    Carboplatin/ gemzar 1 week on and one-week off as patient could not tolerate 2-week on and one week off regimen.  Cycle 1 started on 09/22/2017 Disease progression in March 2020.  Switched to second [employer]   Patient had disease controlled with Keytruda.  However he was noted to have worsening dermatitis with constant flareups requiring steroids.  Beryle Flock is therefore being kept on hold and patient will be switched to third line Padcev.    Treatment has been on hold since February 2021 due to constant flareup of skin rash   Scans in October 2022 showed disease progression with new lung nodules as well as pelvic sidewall adenopathy.  Plan is to therefore restart Padcev   Interval history-rash on his hands has improved since he started the urea cream.  States now his feet are peeling although this was a problem before beginning chemo.  Reports a rash he developed on his chest with a few spots on his back that he noticed on Saturday after a shower.  They are nonbothersome and not itchy.  He has started Kenalog  cream on the rash with improvement.  Constipation has improved with the use of MiraLAX and Senokot.  He is eating and drinking well.  Tries to drink 8 glasses of water  daily.  ECOG PS- 1 Pain scale- 0   Review of systems- Review of Systems  Constitutional:  Positive for malaise/fatigue.  Gastrointestinal:  Positive for constipation.  Skin:  Positive for rash (Chest with few spots on his back-hands have improved-noted some skin peeling on his feet.).     Allergies  Allergen Reactions   Demerol [Meperidine] Nausea And Vomiting   Lipitor [Atorvastatin] Swelling   Sulfa Antibiotics Nausea And Vomiting and Rash     Past Medical History:  Diagnosis Date   Arthritis    Benign fibroma of prostate 08/23/2013   BPH (benign prostatic hyperplasia)    Calculus of kidney 08/23/2013   Glaucoma    no drops in 3 mo pressure good, pt denies glaucoma, eye pressure has been measuring alright.   History of kidney stones    HLD (hyperlipidemia)    HOH (hard of hearing)    Left Hearing Aid   HTN (hypertension) 12/26/2014   Hypertension    Hyponatremia 12/26/2014   Migraines    history of migraines when he was younger.   Restless leg syndrome    Sinus drainage    Skin cancer    Skin cancer    left hand 06/2019   Urothelial cancer ([place])    chemo tx's.   UTI (lower urinary tract infection) 12/26/2014   Vertigo      Past Surgical History:  Procedure Laterality Date   COLONOSCOPY     CYSTOSCOPY W/ RETROGRADES Right 01/30/2015   Procedure: CYSTOSCOPY WITH RETROGRADE PYELOGRAM;  Surgeon: [name], MD;  Location: [facility];  Service: Urology;  Laterality: Right;   CYSTOSCOPY W/ RETROGRADES Bilateral 02/26/2016   Procedure: CYSTOSCOPY WITH RETROGRADE PYELOGRAM;  Surgeon: [name], MD;  Location: [facility];  Service: Urology;  Laterality: Bilateral;   CYSTOSCOPY W/ URETERAL STENT PLACEMENT Right 08/20/2015   Procedure: CYSTOSCOPY WITH RETROGRADE PYELOGRAM/POSSIBLE URETERAL STENT PLACEMENT/BLADDER BIOPSY;  Surgeon: [name], MD;  Location: [facility];  Service: Urology;  Laterality: Right;   CYSTOSCOPY W/ URETERAL STENT PLACEMENT Right 09/12/2015    Procedure: CYSTOSCOPY WITH STENT REPLACEMENT;  Surgeon: [name], MD;  Location: [facility];  Service: Urology;  Laterality: Right;   CYSTOSCOPY WITH BIOPSY Right 09/12/2015   Procedure: CYSTOSCOPY WITH BLADDER AND URETERAL BIOPSY;  Surgeon: [name], MD;  Location: [facility];  Service: Urology;  Laterality: Right;   CYSTOSCOPY WITH STENT PLACEMENT Right 01/30/2015   Procedure: CYSTOSCOPY WITH STENT PLACEMENT;  Surgeon: [name], MD;  Location: [facility];  Service: Urology;  Laterality: Right;   CYSTOSCOPY WITH STENT PLACEMENT Right 12/28/2017   Procedure: CYSTOSCOPY WITH STENT Exchange;  Surgeon: [name], MD;  Location: [facility];  Service: Urology;  Laterality: Right;   CYSTOSCOPY/URETEROSCOPY/HOLMIUM LASER/STENT PLACEMENT Right 08/19/2017   Procedure: CYSTOSCOPY/URETEROSCOPY/HOLMIUM LASER/STENT PLACEMENT;  Surgeon: [name], MD;  Location: [facility];  Service: Urology;  Laterality: Right;   EYE SURGERY Bilateral    Cataract Extraction with IOL   goiter removal     HOLMIUM LASER APPLICATION N/A 9/48/5462   Procedure:  HOLMIUM LASER APPLICATION;  Surgeon: [name], MD;  Location: [facility];  Service: Urology;  Laterality: N/A;   PORTA CATH INSERTION N/A 09/23/2017   Procedure: PORTA CATH INSERTION;  Surgeon: Algernon Huxley, MD;  Location: [facility];  Service: Cardiovascular;  Laterality: N/A;   SPERMATOCELECTOMY     TONSILLECTOMY     TRANSURETHRAL RESECTION OF BLADDER TUMOR N/A 02/26/2016   Procedure: TRANSURETHRAL RESECTION OF BLADDER TUMOR (TURBT);  Surgeon: [name], MD;  Location: [facility];  Service: Urology;  Laterality: N/A;   TRANSURETHRAL RESECTION OF BLADDER TUMOR WITH MITOMYCIN-C N/A 09/12/2015   Procedure: TRANSURETHRAL RESECTION OF BLADDER TUMOR ;  Surgeon: [name], MD;  Location: [facility];  Service: Urology;  Laterality: N/A;   TRANSURETHRAL RESECTION OF BLADDER TUMOR WITH MITOMYCIN-C N/A 03/24/2016   Procedure: TRANSURETHRAL  RESECTION OF BLADDER TUMOR WITH MITOMYCIN-C  (SMALL);  Surgeon: [name], MD;  Location: [facility];  Service: Urology;  Laterality: N/A;   URETERAL BIOPSY Right 08/19/2017   Procedure: Renal Mass BIOPSY;  Surgeon: [name], MD;  Location: [facility];  Service: Urology;  Laterality: Right;   URETEROSCOPY Right 01/30/2015   Procedure: URETEROSCOPY/ WITH BIOPSY AND CYTOLOGY BRUSHING;  Surgeon: [name], MD;  Location: [facility];  Service: Urology;  Laterality: Right;   URETEROSCOPY Right 08/20/2015   Procedure: URETEROSCOPY;  Surgeon: [name], MD;  Location: [facility];  Service: Urology;  Laterality: Right;   URETEROSCOPY Right 09/12/2015   Procedure: URETEROSCOPY;  Surgeon: [name], MD;  Location: [facility];  Service: Urology;  Laterality: Right;   URETEROSCOPY Right 02/26/2016   Procedure: URETEROSCOPY;  Surgeon: [name], MD;  Location: [facility];  Service: Urology;  Laterality: Right;    Social History   Socioeconomic History   Marital status: Married    Spouse name: Not on file   Number of children: Not on file   Years of education: Not on file   Highest education level: Not on file  Occupational History   Not on file  Tobacco Use   Smoking status: Never   Smokeless tobacco: Never  Vaping Use   Vaping Use: Never used  Substance and Sexual Activity   Alcohol use: No    Alcohol/week: 0.0 standard drinks   Drug use: No   Sexual activity: Not Currently  Other Topics Concern   Not on file  Social History Narrative   Not on file   Social Determinants of Health   Financial Resource Strain: Not on file  Food Insecurity: Not on file  Transportation Needs: Not on file  Physical Activity: Not on file  Stress: Not on file  Social Connections: Not on file  Intimate Partner Violence: Not on file    Family History  Problem Relation Age of Onset   Hypertension Mother    Hypertension Father    Prostate cancer Brother    Kidney disease Neg Hx     Kidney cancer Neg Hx    Bladder Cancer Neg Hx      Current Outpatient Medications:    acetaminophen (TYLENOL) 500 MG tablet, Take by mouth., Disp: , Rfl:    amLODipine (NORVASC) 5 MG tablet, Take 5 mg by mouth daily. , Disp: , Rfl:    benazepril-hydrochlorthiazide (LOTENSIN HCT) 20-12.5 MG tablet, Take 1 tablet by mouth daily. , Disp: , Rfl:    camphor-menthol (SARNA) lotion, Apply 1 application topically as needed for itching., Disp: , Rfl:    diazepam (VALIUM) 5 MG tablet, TAKE 1 TABLET (5 MG TOTAL) BY MOUTH EVERY 12 (TWELVE) HOURS AS NEEDED FOR ANXIETY OR SLEEP, Disp: , Rfl:    docusate sodium (COLACE) 250 MG capsule, Take 250 mg by mouth daily., Disp: , Rfl:    fenofibrate micronized (LOFIBRA) 134 MG capsule, Take  134 mg by mouth daily before breakfast. , Disp: , Rfl:    folic acid (FOLVITE) 1 MG tablet, Take by mouth., Disp: , Rfl:    HYDROcodone-acetaminophen (NORCO/VICODIN) 5-325 MG tablet, Take 1 tablet by mouth every 6 (six) hours as needed for severe pain., Disp: 30 tablet, Rfl: 0   LAGEVRIO 200 MG CAPS capsule, Take 4 capsules by mouth 2 (two) times daily., Disp: , Rfl:    Multiple Vitamin (MULTIVITAMIN WITH MINERALS) TABS tablet, Take 1 tablet by mouth daily., Disp: , Rfl:    nystatin (MYCOSTATIN/NYSTOP) powder, Apply 1 application topically 3 (three) times daily. To under arms area that has rash, Disp: 30 g, Rfl: 0   polyethylene glycol (MIRALAX / GLYCOLAX) 17 g packet, Take 17 g by mouth daily., Disp: , Rfl:    prochlorperazine (COMPAZINE) 10 MG tablet, Take 1 tablet (10 mg total) by mouth every 6 (six) hours as needed for nausea or vomiting., Disp: 30 tablet, Rfl: 0   psyllium (METAMUCIL) 58.6 % packet, Take 1 packet by mouth daily., Disp: , Rfl:    senna (SENOKOT) 8.6 MG tablet, Take 1 tablet by mouth daily., Disp: , Rfl:    tamsulosin (FLOMAX) 0.4 MG CAPS capsule, TAKE 1 CAPSULE BY MOUTH EVERY DAY, Disp: 90 capsule, Rfl: 3   triamcinolone cream (KENALOG) 0.1 %, Apply 1  application topically 2 (two) times daily., Disp: , Rfl:    urea (CARMOL) 10 % cream, Apply topically 3 (three) times daily. NOT THE HANDS UNTIL IT CLEARS UP, Disp: 71 g, Rfl: 0 No current facility-administered medications for this visit.  Facility-Administered Medications Ordered in Other Visits:    dexamethasone (DECADRON) 10 mg in sodium chloride 0.9 % 50 mL IVPB, 10 mg, Intravenous, Once, Sindy Guadeloupe, MD, Last Rate: 204 mL/hr at 02/25/21 1101, 10 mg at 02/25/21 1101   enfortumab vedotin-ejfv (PADCEV) 90 mg in sodium chloride 0.9 % 50 mL (1.5254 mg/mL) chemo infusion, 90 mg, Intravenous, Once, Sindy Guadeloupe, MD   heparin lock flush 100 unit/mL, 500 Units, Intracatheter, Once PRN, Sindy Guadeloupe, MD   sodium chloride flush (NS) 0.9 % injection 10 mL, 10 mL, Intravenous, Once, Sindy Guadeloupe, MD   sodium chloride flush (NS) 0.9 % injection 10 mL, 10 mL, Intravenous, PRN, Sindy Guadeloupe, MD  Physical exam:  Vitals:   02/25/21 1018  BP: (!) 146/83  Pulse: 88  Temp: (!) 97.5 F (36.4 C)  TempSrc: Tympanic  SpO2: 97%  Weight: 197 lb 4.8 oz (89.5 kg)   Physical Exam Constitutional:      Appearance: Normal appearance.  HENT:     Head: Normocephalic and atraumatic.  Eyes:     Pupils: Pupils are equal, round, and reactive to light.  Cardiovascular:     Rate and Rhythm: Normal rate and regular rhythm.     Heart sounds: Normal heart sounds. No murmur heard. Pulmonary:     Effort: Pulmonary effort is normal.     Breath sounds: Normal breath sounds. No wheezing.  Abdominal:     General: Bowel sounds are normal. There is no distension.     Palpations: Abdomen is soft.     Tenderness: There is no abdominal tenderness.  Musculoskeletal:        General: Normal range of motion.     Cervical back: Normal range of motion.  Skin:    General: Skin is warm and dry.     Findings: Rash present. Rash is macular and papular.  Comments: On chest with few spots on back  Neurological:      Mental Status: He is alert and oriented to person, place, and time.  Psychiatric:        Judgment: Judgment normal.     CMP Latest Ref Rng & Units 02/25/2021  Glucose 70 - 99 mg/dL 109(H)  BUN 8 - 23 mg/dL 19  Creatinine 0.61 - 1.24 mg/dL 0.90  Sodium 135 - 145 mmol/L 134(L)  Potassium 3.5 - 5.1 mmol/L 3.7  Chloride 98 - 111 mmol/L 97(L)  CO2 22 - 32 mmol/L 26  Calcium 8.9 - 10.3 mg/dL 9.0  Total Protein 6.5 - 8.1 g/dL 6.8  Total Bilirubin 0.3 - 1.2 mg/dL 0.6  Alkaline Phos 38 - 126 U/L 45  AST 15 - 41 U/L 32  ALT 0 - 44 U/L 24   CBC Latest Ref Rng & Units 02/25/2021  WBC 4.0 - 10.5 K/uL 6.9  Hemoglobin 13.0 - 17.0 g/dL 13.5  Hematocrit 39.0 - 52.0 % 39.6  Platelets 150 - 400 K/uL 307    Assessment and plan- Patient is a 87 y.o. male with metastatic urothelial carcinoma with metastases to lungs and pelvic adenopathy. He is here for on treatment assessment prior to cycle 7-day 1 of Padcev  Rash has improved on his palms with the use of urea cream.  He is using Kenalog cream on his chest and back.  He has noted improvement.  Continue.  Constipation-continue MiraLAX and Senokot.  He has been taking MiraLAX twice the day of treatment followed by 2 Senokot's the next day with improvement of his constipation.  Counts today are stable and adequate for treatment.  Proceed with cycle 7-day 1 of chemo.  Disposition- RTC as scheduled next week for cycle 7-day 8 and on 03/18/2021 for cycle 8-day 1 with labs.  I spent 25 minutes dedicated to the care of this patient (face-to-face and non-face-to-face) on the date of the encounter to include what is described in the assessment and plan.  Visit Diagnosis 1. 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 Skin rash   3. Constipation, unspecified constipation type    Faythe Casa, NP 02/25/2021 11:08 AM

## 2021-02-25 NOTE — Progress Notes (Signed)
Pt states he has a rash on his chest and back, using a cortisone cream. Having trouble at night with urination and constipation.

## 2021-02-27 DIAGNOSIS — C7900 Secondary malignant neoplasm of unspecified kidney and renal pelvis: Secondary | ICD-10-CM | POA: Diagnosis not present

## 2021-02-27 DIAGNOSIS — E785 Hyperlipidemia, unspecified: Secondary | ICD-10-CM | POA: Diagnosis not present

## 2021-02-27 DIAGNOSIS — M791 Myalgia, unspecified site: Secondary | ICD-10-CM | POA: Diagnosis not present

## 2021-02-27 DIAGNOSIS — C679 Malignant neoplasm of bladder, unspecified: Secondary | ICD-10-CM | POA: Diagnosis not present

## 2021-02-27 DIAGNOSIS — C7800 Secondary malignant neoplasm of unspecified lung: Secondary | ICD-10-CM | POA: Diagnosis not present

## 2021-02-27 DIAGNOSIS — I10 Essential (primary) hypertension: Secondary | ICD-10-CM | POA: Diagnosis not present

## 2021-02-27 DIAGNOSIS — T466X5A Adverse effect of antihyperlipidemic and antiarteriosclerotic drugs, initial encounter: Secondary | ICD-10-CM | POA: Diagnosis not present

## 2021-03-05 ENCOUNTER — Other Ambulatory Visit: Payer: Self-pay

## 2021-03-05 ENCOUNTER — Inpatient Hospital Stay: Payer: PPO

## 2021-03-05 ENCOUNTER — Other Ambulatory Visit: Payer: Self-pay | Admitting: Internal Medicine

## 2021-03-05 VITALS — BP 141/73 | HR 62 | Temp 97.9°F | Resp 19 | Wt 197.2 lb

## 2021-03-05 DIAGNOSIS — Z5111 Encounter for antineoplastic chemotherapy: Secondary | ICD-10-CM

## 2021-03-05 DIAGNOSIS — C669 Malignant neoplasm of unspecified ureter: Secondary | ICD-10-CM

## 2021-03-05 DIAGNOSIS — Z5112 Encounter for antineoplastic immunotherapy: Secondary | ICD-10-CM | POA: Diagnosis not present

## 2021-03-05 LAB — CBC WITH DIFFERENTIAL/PLATELET
Abs Immature Granulocytes: 0.03 10*3/uL (ref 0.00–0.07)
Basophils Absolute: 0.1 10*3/uL (ref 0.0–0.1)
Basophils Relative: 1 %
Eosinophils Absolute: 0.2 10*3/uL (ref 0.0–0.5)
Eosinophils Relative: 2 %
HCT: 38.5 % — ABNORMAL LOW (ref 39.0–52.0)
Hemoglobin: 13 g/dL (ref 13.0–17.0)
Immature Granulocytes: 1 %
Lymphocytes Relative: 29 %
Lymphs Abs: 1.9 10*3/uL (ref 0.7–4.0)
MCH: 29.3 pg (ref 26.0–34.0)
MCHC: 33.8 g/dL (ref 30.0–36.0)
MCV: 86.9 fL (ref 80.0–100.0)
Monocytes Absolute: 0.8 10*3/uL (ref 0.1–1.0)
Monocytes Relative: 13 %
Neutro Abs: 3.5 10*3/uL (ref 1.7–7.7)
Neutrophils Relative %: 54 %
Platelets: 284 10*3/uL (ref 150–400)
RBC: 4.43 MIL/uL (ref 4.22–5.81)
RDW: 14.2 % (ref 11.5–15.5)
WBC: 6.5 10*3/uL (ref 4.0–10.5)
nRBC: 0 % (ref 0.0–0.2)

## 2021-03-05 LAB — COMPREHENSIVE METABOLIC PANEL
ALT: 18 U/L (ref 0–44)
AST: 29 U/L (ref 15–41)
Albumin: 3.5 g/dL (ref 3.5–5.0)
Alkaline Phosphatase: 54 U/L (ref 38–126)
Anion gap: 8 (ref 5–15)
BUN: 22 mg/dL (ref 8–23)
CO2: 29 mmol/L (ref 22–32)
Calcium: 8.6 mg/dL — ABNORMAL LOW (ref 8.9–10.3)
Chloride: 95 mmol/L — ABNORMAL LOW (ref 98–111)
Creatinine, Ser: 1.57 mg/dL — ABNORMAL HIGH (ref 0.61–1.24)
GFR, Estimated: 42 mL/min — ABNORMAL LOW (ref >60)
Glucose, Bld: 94 mg/dL (ref 70–99)
Potassium: 3.7 mmol/L (ref 3.5–5.1)
Sodium: 132 mmol/L — ABNORMAL LOW (ref 135–145)
Total Bilirubin: 0.3 mg/dL (ref 0.3–1.2)
Total Protein: 6.2 g/dL — ABNORMAL LOW (ref 6.5–8.1)

## 2021-03-05 MED ORDER — HEPARIN SOD (PORK) LOCK FLUSH 100 UNIT/ML IV SOLN
INTRAVENOUS | Status: AC
  Filled 2021-03-05: qty 5

## 2021-03-05 MED ORDER — HEPARIN SOD (PORK) LOCK FLUSH 100 UNIT/ML IV SOLN
500.0000 [IU] | Freq: Once | INTRAVENOUS | Status: AC
  Administered 2021-03-05: 15:00:00 500 [IU] via INTRAVENOUS
  Filled 2021-03-05: qty 5

## 2021-03-05 MED ORDER — SODIUM CHLORIDE 0.9% FLUSH
10.0000 mL | Freq: Once | INTRAVENOUS | Status: AC
  Administered 2021-03-05: 14:00:00 10 mL via INTRAVENOUS
  Filled 2021-03-05: qty 10

## 2021-03-05 NOTE — Progress Notes (Signed)
Recommend holding Padcev; given elevated creatinine 1.57; baseline 0.8.  Also recommend holding Lotensin.  Recommend p.o. intake of fluids.   Recommend blood pressure checks on a daily basis; report Dr. Janese Banks team.  Follow-up as planned with Dr. Janese Banks on jan 9th. GB

## 2021-03-05 NOTE — Progress Notes (Signed)
Labs reviewed with oncall MD. Per MD to hold chemo today and his benazepril-HCTZ prescription until he sees Dr Janese Banks on January 9th.  Per MD to tell pt to "check his BP at home once a day for the next week and report blood pressure findings to Dr.Rao-and for him to keep his appt with Dr. Janese Banks as planned on Jan 9th." Pt and treatment team updated, pt agrees with plan. RN educated pt on the importance of increasing water intake and when to notifying the clinic if any complications occur at home, pt verbalized understanding. VSS. Pt stable for discharge. AVS printed and handed to patient prior to discharge.   Anthony Gonzales [employer]

## 2021-03-07 NOTE — Progress Notes (Signed)
He is already scheduled for 1/9

## 2021-03-07 NOTE — Progress Notes (Signed)
Keep everything as is. Schedule him for possible treatment on jan 9th

## 2021-03-08 ENCOUNTER — Other Ambulatory Visit: Payer: Self-pay | Admitting: Urology

## 2021-03-08 DIAGNOSIS — R3914 Feeling of incomplete bladder emptying: Secondary | ICD-10-CM

## 2021-03-12 ENCOUNTER — Telehealth: Payer: Self-pay

## 2021-03-12 DIAGNOSIS — T466X5A Adverse effect of antihyperlipidemic and antiarteriosclerotic drugs, initial encounter: Secondary | ICD-10-CM | POA: Diagnosis not present

## 2021-03-12 DIAGNOSIS — G720 Drug-induced myopathy: Secondary | ICD-10-CM | POA: Diagnosis not present

## 2021-03-12 DIAGNOSIS — I10 Essential (primary) hypertension: Secondary | ICD-10-CM | POA: Diagnosis not present

## 2021-03-12 DIAGNOSIS — E7800 Pure hypercholesterolemia, unspecified: Secondary | ICD-10-CM | POA: Diagnosis not present

## 2021-03-12 DIAGNOSIS — R739 Hyperglycemia, unspecified: Secondary | ICD-10-CM | POA: Diagnosis not present

## 2021-03-12 DIAGNOSIS — Z79899 Other long term (current) drug therapy: Secondary | ICD-10-CM | POA: Diagnosis not present

## 2021-03-12 NOTE — Telephone Encounter (Signed)
Attempted to call patient regarding BP readings as requested by MD, no answer. Will try again later.

## 2021-03-13 ENCOUNTER — Encounter: Payer: Self-pay | Admitting: Oncology

## 2021-03-13 ENCOUNTER — Other Ambulatory Visit: Payer: Self-pay | Admitting: Urology

## 2021-03-13 DIAGNOSIS — R3914 Feeling of incomplete bladder emptying: Secondary | ICD-10-CM

## 2021-03-13 DIAGNOSIS — N401 Enlarged prostate with lower urinary tract symptoms: Secondary | ICD-10-CM

## 2021-03-18 ENCOUNTER — Inpatient Hospital Stay: Payer: PPO | Admitting: Oncology

## 2021-03-18 ENCOUNTER — Encounter: Payer: Self-pay | Admitting: Oncology

## 2021-03-18 ENCOUNTER — Inpatient Hospital Stay: Payer: PPO

## 2021-03-18 ENCOUNTER — Inpatient Hospital Stay: Payer: PPO | Attending: Oncology

## 2021-03-18 ENCOUNTER — Other Ambulatory Visit: Payer: Self-pay

## 2021-03-18 VITALS — BP 150/82 | HR 73 | Temp 99.0°F | Resp 16 | Ht 72.0 in | Wt 194.1 lb

## 2021-03-18 DIAGNOSIS — Z8042 Family history of malignant neoplasm of prostate: Secondary | ICD-10-CM | POA: Insufficient documentation

## 2021-03-18 DIAGNOSIS — C669 Malignant neoplasm of unspecified ureter: Secondary | ICD-10-CM | POA: Diagnosis not present

## 2021-03-18 DIAGNOSIS — N1330 Unspecified hydronephrosis: Secondary | ICD-10-CM | POA: Insufficient documentation

## 2021-03-18 DIAGNOSIS — I700 Atherosclerosis of aorta: Secondary | ICD-10-CM | POA: Insufficient documentation

## 2021-03-18 DIAGNOSIS — Z79899 Other long term (current) drug therapy: Secondary | ICD-10-CM | POA: Diagnosis not present

## 2021-03-18 DIAGNOSIS — N400 Enlarged prostate without lower urinary tract symptoms: Secondary | ICD-10-CM | POA: Diagnosis not present

## 2021-03-18 DIAGNOSIS — K8689 Other specified diseases of pancreas: Secondary | ICD-10-CM | POA: Insufficient documentation

## 2021-03-18 DIAGNOSIS — Z882 Allergy status to sulfonamides status: Secondary | ICD-10-CM | POA: Diagnosis not present

## 2021-03-18 DIAGNOSIS — N62 Hypertrophy of breast: Secondary | ICD-10-CM | POA: Diagnosis not present

## 2021-03-18 DIAGNOSIS — C7802 Secondary malignant neoplasm of left lung: Secondary | ICD-10-CM | POA: Diagnosis not present

## 2021-03-18 DIAGNOSIS — N281 Cyst of kidney, acquired: Secondary | ICD-10-CM | POA: Diagnosis not present

## 2021-03-18 DIAGNOSIS — C7801 Secondary malignant neoplasm of right lung: Secondary | ICD-10-CM | POA: Insufficient documentation

## 2021-03-18 DIAGNOSIS — Z87440 Personal history of urinary (tract) infections: Secondary | ICD-10-CM | POA: Insufficient documentation

## 2021-03-18 DIAGNOSIS — K862 Cyst of pancreas: Secondary | ICD-10-CM | POA: Diagnosis not present

## 2021-03-18 DIAGNOSIS — M47817 Spondylosis without myelopathy or radiculopathy, lumbosacral region: Secondary | ICD-10-CM | POA: Insufficient documentation

## 2021-03-18 DIAGNOSIS — E785 Hyperlipidemia, unspecified: Secondary | ICD-10-CM | POA: Insufficient documentation

## 2021-03-18 DIAGNOSIS — Z5112 Encounter for antineoplastic immunotherapy: Secondary | ICD-10-CM | POA: Insufficient documentation

## 2021-03-18 DIAGNOSIS — Z85828 Personal history of other malignant neoplasm of skin: Secondary | ICD-10-CM | POA: Insufficient documentation

## 2021-03-18 DIAGNOSIS — R5383 Other fatigue: Secondary | ICD-10-CM | POA: Insufficient documentation

## 2021-03-18 DIAGNOSIS — C775 Secondary and unspecified malignant neoplasm of intrapelvic lymph nodes: Secondary | ICD-10-CM | POA: Insufficient documentation

## 2021-03-18 DIAGNOSIS — N323 Diverticulum of bladder: Secondary | ICD-10-CM | POA: Insufficient documentation

## 2021-03-18 DIAGNOSIS — K8020 Calculus of gallbladder without cholecystitis without obstruction: Secondary | ICD-10-CM | POA: Insufficient documentation

## 2021-03-18 DIAGNOSIS — L309 Dermatitis, unspecified: Secondary | ICD-10-CM | POA: Insufficient documentation

## 2021-03-18 DIAGNOSIS — Z8249 Family history of ischemic heart disease and other diseases of the circulatory system: Secondary | ICD-10-CM | POA: Insufficient documentation

## 2021-03-18 DIAGNOSIS — C661 Malignant neoplasm of right ureter: Secondary | ICD-10-CM | POA: Insufficient documentation

## 2021-03-18 DIAGNOSIS — I10 Essential (primary) hypertension: Secondary | ICD-10-CM | POA: Insufficient documentation

## 2021-03-18 DIAGNOSIS — Z5111 Encounter for antineoplastic chemotherapy: Secondary | ICD-10-CM | POA: Diagnosis not present

## 2021-03-18 DIAGNOSIS — Z87442 Personal history of urinary calculi: Secondary | ICD-10-CM | POA: Insufficient documentation

## 2021-03-18 LAB — COMPREHENSIVE METABOLIC PANEL
ALT: 19 U/L (ref 0–44)
AST: 30 U/L (ref 15–41)
Albumin: 3.6 g/dL (ref 3.5–5.0)
Alkaline Phosphatase: 49 U/L (ref 38–126)
Anion gap: 6 (ref 5–15)
BUN: 20 mg/dL (ref 8–23)
CO2: 25 mmol/L (ref 22–32)
Calcium: 8.5 mg/dL — ABNORMAL LOW (ref 8.9–10.3)
Chloride: 102 mmol/L (ref 98–111)
Creatinine, Ser: 0.99 mg/dL (ref 0.61–1.24)
GFR, Estimated: >60 mL/min (ref >60)
Glucose, Bld: 120 mg/dL — ABNORMAL HIGH (ref 70–99)
Potassium: 3.9 mmol/L (ref 3.5–5.1)
Sodium: 133 mmol/L — ABNORMAL LOW (ref 135–145)
Total Bilirubin: 0.8 mg/dL (ref 0.3–1.2)
Total Protein: 6.5 g/dL (ref 6.5–8.1)

## 2021-03-18 LAB — CBC WITH DIFFERENTIAL/PLATELET
Abs Immature Granulocytes: 0.03 10*3/uL (ref 0.00–0.07)
Basophils Absolute: 0 10*3/uL (ref 0.0–0.1)
Basophils Relative: 1 %
Eosinophils Absolute: 0.1 10*3/uL (ref 0.0–0.5)
Eosinophils Relative: 2 %
HCT: 39 % (ref 39.0–52.0)
Hemoglobin: 13.5 g/dL (ref 13.0–17.0)
Immature Granulocytes: 0 %
Lymphocytes Relative: 24 %
Lymphs Abs: 1.6 10*3/uL (ref 0.7–4.0)
MCH: 30.1 pg (ref 26.0–34.0)
MCHC: 34.6 g/dL (ref 30.0–36.0)
MCV: 87.1 fL (ref 80.0–100.0)
Monocytes Absolute: 0.6 10*3/uL (ref 0.1–1.0)
Monocytes Relative: 9 %
Neutro Abs: 4.4 10*3/uL (ref 1.7–7.7)
Neutrophils Relative %: 64 %
Platelets: 250 10*3/uL (ref 150–400)
RBC: 4.48 MIL/uL (ref 4.22–5.81)
RDW: 14.7 % (ref 11.5–15.5)
WBC: 6.8 10*3/uL (ref 4.0–10.5)
nRBC: 0 % (ref 0.0–0.2)

## 2021-03-18 MED ORDER — HEPARIN SOD (PORK) LOCK FLUSH 100 UNIT/ML IV SOLN
INTRAVENOUS | Status: AC
  Administered 2021-03-18: 500 [IU]
  Filled 2021-03-18: qty 5

## 2021-03-18 MED ORDER — PALONOSETRON HCL INJECTION 0.25 MG/5ML
0.2500 mg | Freq: Once | INTRAVENOUS | Status: AC
  Administered 2021-03-18: 0.25 mg via INTRAVENOUS
  Filled 2021-03-18: qty 5

## 2021-03-18 MED ORDER — SODIUM CHLORIDE 0.9 % IV SOLN
10.0000 mg | Freq: Once | INTRAVENOUS | Status: AC
  Administered 2021-03-18: 10 mg via INTRAVENOUS
  Filled 2021-03-18: qty 10

## 2021-03-18 MED ORDER — SODIUM CHLORIDE 0.9 % IV SOLN
Freq: Once | INTRAVENOUS | Status: AC
  Filled 2021-03-18: qty 250

## 2021-03-18 MED ORDER — HEPARIN SOD (PORK) LOCK FLUSH 100 UNIT/ML IV SOLN
500.0000 [IU] | Freq: Once | INTRAVENOUS | Status: AC | PRN
  Filled 2021-03-18: qty 5

## 2021-03-18 MED ORDER — SODIUM CHLORIDE 0.9 % IV SOLN
90.0000 mg | Freq: Once | INTRAVENOUS | Status: AC
  Administered 2021-03-18: 90 mg via INTRAVENOUS
  Filled 2021-03-18: qty 9

## 2021-03-18 NOTE — Addendum Note (Signed)
Addended by: Randa Evens C on: 03/18/2021 04:42 PM   Modules accepted: Level of Service

## 2021-03-18 NOTE — Patient Instructions (Signed)
[facility] AT [place]  Discharge Instructions: Thank you for choosing [facility] to provide your oncology and hematology care.  If you have a lab appointment with the [facility], please go directly to the [facility] and check in at the registration area.  Wear comfortable clothing and clothing appropriate for easy access to any Portacath or PICC line.   We strive to give you quality time with your provider. You may need to reschedule your appointment if you arrive late (15 or more minutes).  Arriving late affects you and other patients whose appointments are after yours.  Also, if you miss three or more appointments without notifying the office, you may be dismissed from the clinic at the providers discretion.      For prescription refill requests, have your pharmacy contact our office and allow 72 hours for refills to be completed.    Today you received the following chemotherapy and/or immunotherapy agents Padcev       To help prevent nausea and vomiting after your treatment, we encourage you to take your nausea medication as directed.  BELOW ARE SYMPTOMS THAT SHOULD BE REPORTED IMMEDIATELY: *FEVER GREATER THAN 100.4 F (38 C) OR HIGHER *CHILLS OR SWEATING *NAUSEA AND VOMITING THAT IS NOT CONTROLLED WITH YOUR NAUSEA MEDICATION *UNUSUAL SHORTNESS OF BREATH *UNUSUAL BRUISING OR BLEEDING *URINARY PROBLEMS (pain or burning when urinating, or frequent urination) *BOWEL PROBLEMS (unusual diarrhea, constipation, pain near the anus) TENDERNESS IN MOUTH AND THROAT WITH OR WITHOUT PRESENCE OF ULCERS (sore throat, sores in mouth, or a toothache) UNUSUAL RASH, SWELLING OR PAIN  UNUSUAL VAGINAL DISCHARGE OR ITCHING   Items with * indicate a potential emergency and should be followed up as soon as possible or go to the Emergency Department if any problems should occur.  Please show the CHEMOTHERAPY ALERT CARD or IMMUNOTHERAPY ALERT CARD at check-in to  the Emergency Department and triage nurse.  Should you have questions after your visit or need to cancel or reschedule your appointment, please contact [facility] [place] AT [place]  [phone] and follow the prompts.  Office hours are 8:00 a.m. to 4:30 p.m. Monday - Friday. Please note that voicemails left after 4:00 p.m. may not be returned until the following business day.  We are closed weekends and major holidays. You have access to a nurse at all times for urgent questions. Please call the main number to the clinic [phone] and follow the prompts.  For any non-urgent questions, you may also contact your provider using MyChart. We now offer e-Visits for anyone 86 and older to request care online for non-urgent symptoms. For details visit [email or web address].   Also download the MyChart app! Go to the app store, search "MyChart", open the app, select [facility], and log in with your MyChart username and password.  Due to Covid, a mask is required upon entering the hospital/clinic. If you do not have a mask, one will be given to you upon arrival. For doctor visits, patients may have 1 support person aged 86 or older with them. For treatment visits, patients cannot have anyone with them due to current Covid guidelines and our immunocompromised population.

## 2021-03-18 NOTE — Progress Notes (Signed)
Hematology/Oncology Consult note [facility]  Telephone:([phone] [phone]  Patient Care Team: Idelle Crouch, MD as PCP - General (Internal Medicine) Sindy Guadeloupe, MD as Consulting Physician (Hematology and Oncology) Sindy Guadeloupe, MD as Consulting Physician (Hematology and Oncology) Sindy Guadeloupe, MD as Consulting Physician (Hematology and Oncology)   Name of the patient: Anthony Gonzales  [id number]  [birth year]   Date of visit: 03/18/21  Diagnosis- Metastatic urothelial carcinoma with possible mets to the obturator node. Indeterminate hilar lymph nodes and LUL lung nodule currently in remission  Chief complaint/ Reason for visit-on treatment assessment prior to cycle 8-day 1 of Padcev  Heme/Onc history: patient is a 88 year old male with past medical history significant for hypertension and long-standing history of superficial bladder cancer for which he sees [name].  He has undergone TURBT as well as ureteroscopy since 2017 along with mitomycin as well in the past.  Most recently he underwent CT abdomen on 08/06/2017 which showed a soft tissue mass in the right renal pelvis concerning for upper tract urothelial neoplasm.  Soft tissue fullness at the right ureterovesical junction with proximal right hydroureteronephrosis.  Several millimeter attenuation lesion in the pancreatic head.   He underwent diagnostic ureteroscopy and was found to have 2 high-grade lesions within his right kidney.  There was a nodular high-grade appearing lesion in the right anterior renal pelvis.  There was also a second ureteral tumor fungating from the right ureteral orifice extending into the distal ureter also consistent with high-grade invasive urothelial carcinoma.  Muscle invasion could not be assessed.  He also underwent a CT chest which showed a rounded nodule in the right upper lobe measuring 14 mm concerning for metastases.  2 other 3 mm lesions were  also noted in the left upper lobe likely benign   Plan initially was neoadjuvant chemotherapy followed by possible surgery but given the presence of lung lesion patient has been referred to oncology for the same.   PET/CT on 09/21/17 showed: IMPRESSION: 1. Hypermetabolic right obturator lymph node is most indicative of metastatic disease. 2. Mildly hypermetabolic left hilar lymph nodes are nonspecific. Continued attention on follow-up exams is warranted. 3. Right upper lobe pulmonary nodule shows metabolism after just above blood pool and is therefore indeterminate. Continued attention on follow-up exams is warranted. 4. Aortic atherosclerosis (ICD10-170.0). Coronary artery calcification. 5. Cholelithiasis    Carboplatin/ gemzar 1 week on and one-week off as patient could not tolerate 2-week on and one week off regimen.  Cycle 1 started on 09/22/2017 Disease progression in March 2020.  Switched to second [employer]   Patient had disease controlled with Keytruda.  However he was noted to have worsening dermatitis with constant flareups requiring steroids.  Beryle Flock is therefore being kept on hold and patient will be switched to third line Padcev.    Treatment has been on hold since February 2021 due to constant flareup of skin rash   Scans in October 2022 showed disease progression with new lung nodules as well as pelvic sidewall adenopathy.  Plan is to therefore restart Padcev    Interval history-patient currently reports doing well.  He has not had any recurrence of skin rash.  His oral intake has improved.  ECOG PS- 1 Pain scale- 0   Review of systems- Review of Systems  Constitutional:  Negative for chills, fever, malaise/fatigue and weight loss.  HENT:  Negative for congestion, ear discharge and nosebleeds.   Eyes:  Negative for  blurred vision.  Respiratory:  Negative for cough, hemoptysis, sputum production, shortness of breath and wheezing.   Cardiovascular:  Negative for  chest pain, palpitations, orthopnea and claudication.  Gastrointestinal:  Negative for abdominal pain, blood in stool, constipation, diarrhea, heartburn, melena, nausea and vomiting.  Genitourinary:  Negative for dysuria, flank pain, frequency, hematuria and urgency.  Musculoskeletal:  Negative for back pain, joint pain and myalgias.  Skin:  Negative for rash.  Neurological:  Negative for dizziness, tingling, focal weakness, seizures, weakness and headaches.  Endo/Heme/Allergies:  Does not bruise/bleed easily.  Psychiatric/Behavioral:  Negative for depression and suicidal ideas. The patient does not have insomnia.       Allergies  Allergen Reactions   Demerol [Meperidine] Nausea And Vomiting   Lipitor [Atorvastatin] Swelling   Sulfa Antibiotics Nausea And Vomiting and Rash     Past Medical History:  Diagnosis Date   Arthritis    Benign fibroma of prostate 08/23/2013   BPH (benign prostatic hyperplasia)    Calculus of kidney 08/23/2013   Glaucoma    no drops in 3 mo pressure good, pt denies glaucoma, eye pressure has been measuring alright.   History of kidney stones    HLD (hyperlipidemia)    HOH (hard of hearing)    Left Hearing Aid   HTN (hypertension) 12/26/2014   Hypertension    Hyponatremia 12/26/2014   Migraines    history of migraines when he was younger.   Restless leg syndrome    Sinus drainage    Skin cancer    Skin cancer    left hand 06/2019   Urothelial cancer ([place])    chemo tx's.   UTI (lower urinary tract infection) 12/26/2014   Vertigo      Past Surgical History:  Procedure Laterality Date   COLONOSCOPY     CYSTOSCOPY W/ RETROGRADES Right 01/30/2015   Procedure: CYSTOSCOPY WITH RETROGRADE PYELOGRAM;  Surgeon: [name], MD;  Location: [facility];  Service: Urology;  Laterality: Right;   CYSTOSCOPY W/ RETROGRADES Bilateral 02/26/2016   Procedure: CYSTOSCOPY WITH RETROGRADE PYELOGRAM;  Surgeon: [name], MD;  Location: [facility];  Service:  Urology;  Laterality: Bilateral;   CYSTOSCOPY W/ URETERAL STENT PLACEMENT Right 08/20/2015   Procedure: CYSTOSCOPY WITH RETROGRADE PYELOGRAM/POSSIBLE URETERAL STENT PLACEMENT/BLADDER BIOPSY;  Surgeon: [name], MD;  Location: [facility];  Service: Urology;  Laterality: Right;   CYSTOSCOPY W/ URETERAL STENT PLACEMENT Right 09/12/2015   Procedure: CYSTOSCOPY WITH STENT REPLACEMENT;  Surgeon: [name], MD;  Location: [facility];  Service: Urology;  Laterality: Right;   CYSTOSCOPY WITH BIOPSY Right 09/12/2015   Procedure: CYSTOSCOPY WITH BLADDER AND URETERAL BIOPSY;  Surgeon: [name], MD;  Location: [facility];  Service: Urology;  Laterality: Right;   CYSTOSCOPY WITH STENT PLACEMENT Right 01/30/2015   Procedure: CYSTOSCOPY WITH STENT PLACEMENT;  Surgeon: [name], MD;  Location: [facility];  Service: Urology;  Laterality: Right;   CYSTOSCOPY WITH STENT PLACEMENT Right 12/28/2017   Procedure: CYSTOSCOPY WITH STENT Exchange;  Surgeon: [name], MD;  Location: [facility];  Service: Urology;  Laterality: Right;   CYSTOSCOPY/URETEROSCOPY/HOLMIUM LASER/STENT PLACEMENT Right 08/19/2017   Procedure: CYSTOSCOPY/URETEROSCOPY/HOLMIUM LASER/STENT PLACEMENT;  Surgeon: [name], MD;  Location: [facility];  Service: Urology;  Laterality: Right;   EYE SURGERY Bilateral    Cataract Extraction with IOL   goiter removal     HOLMIUM LASER APPLICATION N/A 2/67/1245   Procedure:  HOLMIUM LASER APPLICATION;  Surgeon: [name], MD;  Location: [facility];  Service: Urology;  Laterality: N/A;   PORTA CATH INSERTION N/A 09/23/2017   Procedure: PORTA CATH INSERTION;  Surgeon: Algernon Huxley, MD;  Location: [place] [facility];  Service: Cardiovascular;  Laterality: N/A;   SPERMATOCELECTOMY     TONSILLECTOMY     TRANSURETHRAL RESECTION OF BLADDER TUMOR N/A 02/26/2016   Procedure: TRANSURETHRAL RESECTION OF BLADDER TUMOR (TURBT);  Surgeon: [name], MD;  Location: [facility];  Service: Urology;   Laterality: N/A;   TRANSURETHRAL RESECTION OF BLADDER TUMOR WITH MITOMYCIN-C N/A 09/12/2015   Procedure: TRANSURETHRAL RESECTION OF BLADDER TUMOR ;  Surgeon: [name], MD;  Location: [facility];  Service: Urology;  Laterality: N/A;   TRANSURETHRAL RESECTION OF BLADDER TUMOR WITH MITOMYCIN-C N/A 03/24/2016   Procedure: TRANSURETHRAL RESECTION OF BLADDER TUMOR WITH MITOMYCIN-C  (SMALL);  Surgeon: [name], MD;  Location: [facility];  Service: Urology;  Laterality: N/A;   URETERAL BIOPSY Right 08/19/2017   Procedure: Renal Mass BIOPSY;  Surgeon: [name], MD;  Location: [facility];  Service: Urology;  Laterality: Right;   URETEROSCOPY Right 01/30/2015   Procedure: URETEROSCOPY/ WITH BIOPSY AND CYTOLOGY BRUSHING;  Surgeon: [name], MD;  Location: [facility];  Service: Urology;  Laterality: Right;   URETEROSCOPY Right 08/20/2015   Procedure: URETEROSCOPY;  Surgeon: [name], MD;  Location: [facility];  Service: Urology;  Laterality: Right;   URETEROSCOPY Right 09/12/2015   Procedure: URETEROSCOPY;  Surgeon: [name], MD;  Location: [facility];  Service: Urology;  Laterality: Right;   URETEROSCOPY Right 02/26/2016   Procedure: URETEROSCOPY;  Surgeon: [name], MD;  Location: [facility];  Service: Urology;  Laterality: Right;    Social History   Socioeconomic History   Marital status: Married    Spouse name: Not on file   Number of children: Not on file   Years of education: Not on file   Highest education level: Not on file  Occupational History   Not on file  Tobacco Use   Smoking status: Never   Smokeless tobacco: Never  Vaping Use   Vaping Use: Never used  Substance and Sexual Activity   Alcohol use: No    Alcohol/week: 0.0 standard drinks   Drug use: No   Sexual activity: Not Currently  Other Topics Concern   Not on file  Social History Narrative   Not on file   Social Determinants of Health   Financial Resource Strain: Not on file  Food Insecurity:  Not on file  Transportation Needs: Not on file  Physical Activity: Not on file  Stress: Not on file  Social Connections: Not on file  Intimate Partner Violence: Not on file    Family History  Problem Relation Age of Onset   Hypertension Mother    Hypertension Father    Prostate cancer Brother    Kidney disease Neg Hx    Kidney cancer Neg Hx    Bladder Cancer Neg Hx      Current Outpatient Medications:    acetaminophen (TYLENOL) 500 MG tablet, Take by mouth., Disp: , Rfl:    amLODipine (NORVASC) 5 MG tablet, Take 5 mg by mouth daily. , Disp: , Rfl:    camphor-menthol (SARNA) lotion, Apply 1 application topically as needed for itching., Disp: , Rfl:    diazepam (VALIUM) 5 MG tablet, TAKE 1 TABLET (5 MG TOTAL) BY MOUTH EVERY 12 (TWELVE) HOURS AS NEEDED FOR ANXIETY OR SLEEP, Disp: , Rfl:    docusate sodium (COLACE) 250 MG capsule, Take 250 mg by mouth daily., Disp: ,  Rfl:    fenofibrate micronized (LOFIBRA) 134 MG capsule, Take 134 mg by mouth daily before breakfast. , Disp: , Rfl:    folic acid (FOLVITE) 1 MG tablet, Take by mouth., Disp: , Rfl:    HYDROcodone-acetaminophen (NORCO/VICODIN) 5-325 MG tablet, Take 1 tablet by mouth every 6 (six) hours as needed for severe pain., Disp: 30 tablet, Rfl: 0   Multiple Vitamin (MULTIVITAMIN WITH MINERALS) TABS tablet, Take 1 tablet by mouth daily., Disp: , Rfl:    nystatin (MYCOSTATIN/NYSTOP) powder, Apply 1 application topically 3 (three) times daily. To under arms area that has rash, Disp: 30 g, Rfl: 0   polyethylene glycol (MIRALAX / GLYCOLAX) 17 g packet, Take 17 g by mouth daily., Disp: , Rfl:    prochlorperazine (COMPAZINE) 10 MG tablet, Take 1 tablet (10 mg total) by mouth every 6 (six) hours as needed for nausea or vomiting., Disp: 30 tablet, Rfl: 0   psyllium (METAMUCIL) 58.6 % packet, Take 1 packet by mouth daily., Disp: , Rfl:    senna (SENOKOT) 8.6 MG tablet, Take 1 tablet by mouth daily., Disp: , Rfl:    tamsulosin (FLOMAX) 0.4 MG  CAPS capsule, TAKE 1 CAPSULE BY MOUTH EVERY DAY, Disp: 90 capsule, Rfl: 3   triamcinolone cream (KENALOG) 0.1 %, Apply 1 application topically 2 (two) times daily., Disp: , Rfl:    urea (CARMOL) 10 % cream, Apply topically 3 (three) times daily. NOT THE HANDS UNTIL IT CLEARS UP, Disp: 71 g, Rfl: 0   benazepril-hydrochlorthiazide (LOTENSIN HCT) 20-12.5 MG tablet, Take 1 tablet by mouth daily.  (Patient not taking: Reported on 03/18/2021), Disp: , Rfl:    LAGEVRIO 200 MG CAPS capsule, Take 4 capsules by mouth 2 (two) times daily. (Patient not taking: Reported on 03/18/2021), Disp: , Rfl:  No current facility-administered medications for this visit.  Facility-Administered Medications Ordered in Other Visits:    heparin lock flush 100 UNIT/ML injection, , , ,    sodium chloride flush (NS) 0.9 % injection 10 mL, 10 mL, Intravenous, Once, Sindy Guadeloupe, MD   sodium chloride flush (NS) 0.9 % injection 10 mL, 10 mL, Intravenous, PRN, Sindy Guadeloupe, MD  Physical exam:  Vitals:   03/18/21 1324  BP: (!) 150/82  Pulse: 73  Resp: 16  Temp: 99 F (37.2 C)  TempSrc: Tympanic  SpO2: 99%  Weight: 194 lb 1.6 oz (88 kg)  Height: 6' (1.829 m)   Physical Exam Constitutional:      General: He is not in acute distress. Cardiovascular:     Rate and Rhythm: Normal rate and regular rhythm.     Heart sounds: Normal heart sounds.  Pulmonary:     Effort: Pulmonary effort is normal.     Breath sounds: Normal breath sounds.  Skin:    General: Skin is warm and dry.  Neurological:     Mental Status: He is alert and oriented to person, place, and time.     CMP Latest Ref Rng & Units 03/18/2021  Glucose 70 - 99 mg/dL 120(H)  BUN 8 - 23 mg/dL 20  Creatinine 0.61 - 1.24 mg/dL 0.99  Sodium 135 - 145 mmol/L 133(L)  Potassium 3.5 - 5.1 mmol/L 3.9  Chloride 98 - 111 mmol/L 102  CO2 22 - 32 mmol/L 25  Calcium 8.9 - 10.3 mg/dL 8.5(L)  Total Protein 6.5 - 8.1 g/dL 6.5  Total Bilirubin 0.3 - 1.2 mg/dL 0.8   Alkaline Phos 38 - 126 U/L 49  AST 15 -  41 U/L 30  ALT 0 - 44 U/L 19   CBC Latest Ref Rng & Units 03/18/2021  WBC 4.0 - 10.5 K/uL 6.8  Hemoglobin 13.0 - 17.0 g/dL 13.5  Hematocrit 39.0 - 52.0 % 39.0  Platelets 150 - 400 K/uL 250    Assessment and plan- Patient is a 88 y.o. male  with metastatic urothelial carcinoma with metastases to lungs and pelvic adenopathy.  He is here for on treatment assessment prior to cycle 8-day 1 of Padcev  Counts okay to proceed with cycle 8-day 1 of Padcev today.  We will directly proceed for day 8 of treatment next week with labs and I will see him back in 3 weeks for cycle 9-day 1.  Plan to get CT chest abdomen and pelvis with contrast prior.  Overall patient is tolerating treatment well and has not had any recurrence of his skin rash.  He remains off topical steroids as well as methotrexate.   Visit Diagnosis 1. Encounter for antineoplastic chemotherapy   2. 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 Randa Evens, MD, MPH [facility] [phone] 03/18/2021 4:39 PM

## 2021-03-19 ENCOUNTER — Encounter: Payer: Self-pay | Admitting: Oncology

## 2021-03-19 ENCOUNTER — Other Ambulatory Visit: Payer: Self-pay | Admitting: Oncology

## 2021-03-19 ENCOUNTER — Other Ambulatory Visit: Payer: Self-pay | Admitting: Urology

## 2021-03-19 DIAGNOSIS — N401 Enlarged prostate with lower urinary tract symptoms: Secondary | ICD-10-CM

## 2021-03-19 DIAGNOSIS — R3914 Feeling of incomplete bladder emptying: Secondary | ICD-10-CM

## 2021-03-19 MED ORDER — TAMSULOSIN HCL 0.4 MG PO CAPS: 0.4000 mg | ORAL_CAPSULE | Freq: Every day | ORAL | 3 refills | Status: DC

## 2021-03-20 ENCOUNTER — Encounter: Payer: Self-pay | Admitting: Oncology

## 2021-03-20 ENCOUNTER — Ambulatory Visit: Payer: Medicare HMO | Admitting: Urology

## 2021-03-20 ENCOUNTER — Telehealth: Payer: Self-pay | Admitting: *Deleted

## 2021-03-20 NOTE — Telephone Encounter (Signed)
Called and left message yest that dr Janese Banks that she did refill tamsulosin

## 2021-03-25 ENCOUNTER — Other Ambulatory Visit: Payer: Self-pay

## 2021-03-25 ENCOUNTER — Inpatient Hospital Stay: Payer: PPO

## 2021-03-25 VITALS — BP 146/73 | HR 70 | Temp 97.4°F | Resp 19 | Wt 192.9 lb

## 2021-03-25 DIAGNOSIS — Z5112 Encounter for antineoplastic immunotherapy: Secondary | ICD-10-CM | POA: Diagnosis not present

## 2021-03-25 DIAGNOSIS — C669 Malignant neoplasm of unspecified ureter: Secondary | ICD-10-CM

## 2021-03-25 LAB — CBC WITH DIFFERENTIAL/PLATELET
Abs Immature Granulocytes: 0.01 10*3/uL (ref 0.00–0.07)
Basophils Absolute: 0.1 10*3/uL (ref 0.0–0.1)
Basophils Relative: 1 %
Eosinophils Absolute: 0.2 10*3/uL (ref 0.0–0.5)
Eosinophils Relative: 3 %
HCT: 39.9 % (ref 39.0–52.0)
Hemoglobin: 13.7 g/dL (ref 13.0–17.0)
Immature Granulocytes: 0 %
Lymphocytes Relative: 24 %
Lymphs Abs: 1.7 10*3/uL (ref 0.7–4.0)
MCH: 30.1 pg (ref 26.0–34.0)
MCHC: 34.3 g/dL (ref 30.0–36.0)
MCV: 87.7 fL (ref 80.0–100.0)
Monocytes Absolute: 0.8 10*3/uL (ref 0.1–1.0)
Monocytes Relative: 11 %
Neutro Abs: 4.4 10*3/uL (ref 1.7–7.7)
Neutrophils Relative %: 61 %
Platelets: 225 10*3/uL (ref 150–400)
RBC: 4.55 MIL/uL (ref 4.22–5.81)
RDW: 14.8 % (ref 11.5–15.5)
WBC: 7.1 10*3/uL (ref 4.0–10.5)
nRBC: 0 % (ref 0.0–0.2)

## 2021-03-25 LAB — COMPREHENSIVE METABOLIC PANEL
ALT: 22 U/L (ref 0–44)
AST: 33 U/L (ref 15–41)
Albumin: 3.9 g/dL (ref 3.5–5.0)
Alkaline Phosphatase: 48 U/L (ref 38–126)
Anion gap: 10 (ref 5–15)
BUN: 19 mg/dL (ref 8–23)
CO2: 25 mmol/L (ref 22–32)
Calcium: 8.9 mg/dL (ref 8.9–10.3)
Chloride: 99 mmol/L (ref 98–111)
Creatinine, Ser: 0.9 mg/dL (ref 0.61–1.24)
GFR, Estimated: >60 mL/min (ref >60)
Glucose, Bld: 119 mg/dL — ABNORMAL HIGH (ref 70–99)
Potassium: 3.7 mmol/L (ref 3.5–5.1)
Sodium: 134 mmol/L — ABNORMAL LOW (ref 135–145)
Total Bilirubin: 0.5 mg/dL (ref 0.3–1.2)
Total Protein: 6.6 g/dL (ref 6.5–8.1)

## 2021-03-25 MED ORDER — SODIUM CHLORIDE 0.9 % IV SOLN
Freq: Once | INTRAVENOUS | Status: AC
  Filled 2021-03-25: qty 250

## 2021-03-25 MED ORDER — SODIUM CHLORIDE 0.9 % IV SOLN
10.0000 mg | Freq: Once | INTRAVENOUS | Status: AC
  Administered 2021-03-25: 10 mg via INTRAVENOUS
  Filled 2021-03-25: qty 10

## 2021-03-25 MED ORDER — SODIUM CHLORIDE 0.9% FLUSH
10.0000 mL | Freq: Once | INTRAVENOUS | Status: AC
  Administered 2021-03-25: 10 mL via INTRAVENOUS
  Filled 2021-03-25: qty 10

## 2021-03-25 MED ORDER — SODIUM CHLORIDE 0.9 % IV SOLN
90.0000 mg | Freq: Once | INTRAVENOUS | Status: AC
  Administered 2021-03-25: 90 mg via INTRAVENOUS
  Filled 2021-03-25: qty 9

## 2021-03-25 MED ORDER — HEPARIN SOD (PORK) LOCK FLUSH 100 UNIT/ML IV SOLN
INTRAVENOUS | Status: AC
  Filled 2021-03-25: qty 5

## 2021-03-25 MED ORDER — PALONOSETRON HCL INJECTION 0.25 MG/5ML
0.2500 mg | Freq: Once | INTRAVENOUS | Status: AC
  Administered 2021-03-25: 0.25 mg via INTRAVENOUS
  Filled 2021-03-25: qty 5

## 2021-03-25 MED ORDER — HEPARIN SOD (PORK) LOCK FLUSH 100 UNIT/ML IV SOLN
INTRAVENOUS | Status: AC
  Administered 2021-03-25: 500 [IU]
  Filled 2021-03-25: qty 5

## 2021-03-25 MED ORDER — HEPARIN SOD (PORK) LOCK FLUSH 100 UNIT/ML IV SOLN
500.0000 [IU] | Freq: Once | INTRAVENOUS | Status: AC | PRN
  Filled 2021-03-25: qty 5

## 2021-03-25 NOTE — Patient Instructions (Signed)
[facility] CTR AT [place]  Discharge Instructions: Thank you for choosing [facility] to provide your oncology and hematology care.  If you have a lab appointment with the [facility], please go directly to the [place] and check in at the registration area.  Wear comfortable clothing and clothing appropriate for easy access to any Portacath or PICC line.   We strive to give you quality time with your provider. You may need to reschedule your appointment if you arrive late (15 or more minutes).  Arriving late affects you and other patients whose appointments are after yours.  Also, if you miss three or more appointments without notifying the office, you may be dismissed from the clinic at the providers discretion.      For prescription refill requests, have your pharmacy contact our office and allow 72 hours for refills to be completed.    Today you received the following chemotherapy and/or immunotherapy agents Padcev   To help prevent nausea and vomiting after your treatment, we encourage you to take your nausea medication as directed.  BELOW ARE SYMPTOMS THAT SHOULD BE REPORTED IMMEDIATELY: *FEVER GREATER THAN 100.4 F (38 C) OR HIGHER *CHILLS OR SWEATING *NAUSEA AND VOMITING THAT IS NOT CONTROLLED WITH YOUR NAUSEA MEDICATION *UNUSUAL SHORTNESS OF BREATH *UNUSUAL BRUISING OR BLEEDING *URINARY PROBLEMS (pain or burning when urinating, or frequent urination) *BOWEL PROBLEMS (unusual diarrhea, constipation, pain near the anus) TENDERNESS IN MOUTH AND THROAT WITH OR WITHOUT PRESENCE OF ULCERS (sore throat, sores in mouth, or a toothache) UNUSUAL RASH, SWELLING OR PAIN  UNUSUAL VAGINAL DISCHARGE OR ITCHING   Items with * indicate a potential emergency and should be followed up as soon as possible or go to the Emergency Department if any problems should occur.  Please show the CHEMOTHERAPY ALERT CARD or IMMUNOTHERAPY ALERT CARD at check-in to the  Emergency Department and triage nurse.  Should you have questions after your visit or need to cancel or reschedule your appointment, please contact [facility] CANCER [place] AT [place]  [phone] and follow the prompts.  Office hours are 8:00 a.m. to 4:30 p.m. Monday - Friday. Please note that voicemails left after 4:00 p.m. may not be returned until the following business day.  We are closed weekends and major holidays. You have access to a nurse at all times for urgent questions. Please call the main number to the clinic [phone] and follow the prompts.  For any non-urgent questions, you may also contact your provider using MyChart. We now offer e-Visits for anyone 74 and older to request care online for non-urgent symptoms. For details visit [email or web address].   Also download the MyChart app! Go to the app store, search "MyChart", open the app, select [place], and log in with your MyChart username and password.  Due to Covid, a mask is required upon entering the hospital/clinic. If you do not have a mask, one will be given to you upon arrival. For doctor visits, patients may have 1 support person aged 94 or older with them. For treatment visits, patients cannot have anyone with them due to current Covid guidelines and our immunocompromised population.

## 2021-04-05 ENCOUNTER — Ambulatory Visit
Admission: RE | Admit: 2021-04-05 | Discharge: 2021-04-05 | Disposition: A | Discharge status: Home / Self Care | Payer: PPO | Source: Ambulatory Visit | Attending: Oncology | Admitting: Oncology

## 2021-04-05 ENCOUNTER — Other Ambulatory Visit: Payer: Self-pay

## 2021-04-05 DIAGNOSIS — K8020 Calculus of gallbladder without cholecystitis without obstruction: Secondary | ICD-10-CM | POA: Insufficient documentation

## 2021-04-05 DIAGNOSIS — C669 Malignant neoplasm of unspecified ureter: Secondary | ICD-10-CM | POA: Diagnosis not present

## 2021-04-05 DIAGNOSIS — C779 Secondary and unspecified malignant neoplasm of lymph node, unspecified: Secondary | ICD-10-CM | POA: Diagnosis not present

## 2021-04-05 DIAGNOSIS — N62 Hypertrophy of breast: Secondary | ICD-10-CM | POA: Diagnosis not present

## 2021-04-05 DIAGNOSIS — C7800 Secondary malignant neoplasm of unspecified lung: Secondary | ICD-10-CM | POA: Diagnosis not present

## 2021-04-05 DIAGNOSIS — I2510 Atherosclerotic heart disease of native coronary artery without angina pectoris: Secondary | ICD-10-CM | POA: Diagnosis not present

## 2021-04-05 DIAGNOSIS — I700 Atherosclerosis of aorta: Secondary | ICD-10-CM | POA: Diagnosis not present

## 2021-04-05 DIAGNOSIS — Z5111 Encounter for antineoplastic chemotherapy: Secondary | ICD-10-CM | POA: Diagnosis not present

## 2021-04-05 MED ORDER — IOHEXOL 300 MG/ML  SOLN
100.0000 mL | Freq: Once | INTRAMUSCULAR | Status: AC | PRN
  Administered 2021-04-05: 100 mL via INTRAVENOUS

## 2021-04-08 ENCOUNTER — Inpatient Hospital Stay: Payer: PPO

## 2021-04-08 ENCOUNTER — Encounter: Payer: Self-pay | Admitting: Oncology

## 2021-04-08 ENCOUNTER — Inpatient Hospital Stay: Payer: PPO | Admitting: Oncology

## 2021-04-08 ENCOUNTER — Other Ambulatory Visit: Payer: Self-pay

## 2021-04-08 VITALS — BP 144/78 | HR 71 | Temp 98.7°F | Resp 16 | Ht 72.0 in | Wt 193.8 lb

## 2021-04-08 DIAGNOSIS — L270 Generalized skin eruption due to drugs and medicaments taken internally: Secondary | ICD-10-CM | POA: Diagnosis not present

## 2021-04-08 DIAGNOSIS — C669 Malignant neoplasm of unspecified ureter: Secondary | ICD-10-CM | POA: Diagnosis not present

## 2021-04-08 DIAGNOSIS — Z7189 Other specified counseling: Secondary | ICD-10-CM

## 2021-04-08 DIAGNOSIS — Z5111 Encounter for antineoplastic chemotherapy: Secondary | ICD-10-CM

## 2021-04-08 DIAGNOSIS — Z5112 Encounter for antineoplastic immunotherapy: Secondary | ICD-10-CM | POA: Diagnosis not present

## 2021-04-08 LAB — COMPREHENSIVE METABOLIC PANEL
ALT: 21 U/L (ref 0–44)
AST: 32 U/L (ref 15–41)
Albumin: 3.8 g/dL (ref 3.5–5.0)
Alkaline Phosphatase: 45 U/L (ref 38–126)
Anion gap: 7 (ref 5–15)
BUN: 21 mg/dL (ref 8–23)
CO2: 27 mmol/L (ref 22–32)
Calcium: 8.8 mg/dL — ABNORMAL LOW (ref 8.9–10.3)
Chloride: 99 mmol/L (ref 98–111)
Creatinine, Ser: 0.98 mg/dL (ref 0.61–1.24)
GFR, Estimated: >60 mL/min (ref >60)
Glucose, Bld: 119 mg/dL — ABNORMAL HIGH (ref 70–99)
Potassium: 3.5 mmol/L (ref 3.5–5.1)
Sodium: 133 mmol/L — ABNORMAL LOW (ref 135–145)
Total Bilirubin: 0.2 mg/dL — ABNORMAL LOW (ref 0.3–1.2)
Total Protein: 6.4 g/dL — ABNORMAL LOW (ref 6.5–8.1)

## 2021-04-08 LAB — CBC WITH DIFFERENTIAL/PLATELET
Abs Immature Granulocytes: 0.02 10*3/uL (ref 0.00–0.07)
Basophils Absolute: 0.1 10*3/uL (ref 0.0–0.1)
Basophils Relative: 1 %
Eosinophils Absolute: 0.1 10*3/uL (ref 0.0–0.5)
Eosinophils Relative: 2 %
HCT: 38.6 % — ABNORMAL LOW (ref 39.0–52.0)
Hemoglobin: 13.4 g/dL (ref 13.0–17.0)
Immature Granulocytes: 0 %
Lymphocytes Relative: 31 %
Lymphs Abs: 2 10*3/uL (ref 0.7–4.0)
MCH: 30.2 pg (ref 26.0–34.0)
MCHC: 34.7 g/dL (ref 30.0–36.0)
MCV: 87.1 fL (ref 80.0–100.0)
Monocytes Absolute: 0.7 10*3/uL (ref 0.1–1.0)
Monocytes Relative: 10 %
Neutro Abs: 3.6 10*3/uL (ref 1.7–7.7)
Neutrophils Relative %: 56 %
Platelets: 278 10*3/uL (ref 150–400)
RBC: 4.43 MIL/uL (ref 4.22–5.81)
RDW: 15.1 % (ref 11.5–15.5)
WBC: 6.5 10*3/uL (ref 4.0–10.5)
nRBC: 0 % (ref 0.0–0.2)

## 2021-04-08 MED ORDER — SODIUM CHLORIDE 0.9 % IV SOLN
10.0000 mg | Freq: Once | INTRAVENOUS | Status: AC
  Administered 2021-04-08: 10 mg via INTRAVENOUS
  Filled 2021-04-08: qty 10

## 2021-04-08 MED ORDER — HEPARIN SOD (PORK) LOCK FLUSH 100 UNIT/ML IV SOLN
500.0000 [IU] | Freq: Once | INTRAVENOUS | Status: AC | PRN
  Filled 2021-04-08: qty 5

## 2021-04-08 MED ORDER — SODIUM CHLORIDE 0.9 % IV SOLN
90.0000 mg | Freq: Once | INTRAVENOUS | Status: AC
  Administered 2021-04-08: 90 mg via INTRAVENOUS
  Filled 2021-04-08: qty 9

## 2021-04-08 MED ORDER — SODIUM CHLORIDE 0.9% FLUSH
10.0000 mL | INTRAVENOUS | Status: DC | PRN
  Administered 2021-04-08: 10 mL
  Filled 2021-04-08: qty 10

## 2021-04-08 MED ORDER — HEPARIN SOD (PORK) LOCK FLUSH 100 UNIT/ML IV SOLN
INTRAVENOUS | Status: AC
  Administered 2021-04-08: 500 [IU]
  Filled 2021-04-08: qty 5

## 2021-04-08 MED ORDER — SODIUM CHLORIDE 0.9 % IV SOLN
Freq: Once | INTRAVENOUS | Status: AC
  Filled 2021-04-08: qty 250

## 2021-04-08 MED ORDER — PALONOSETRON HCL INJECTION 0.25 MG/5ML
0.2500 mg | Freq: Once | INTRAVENOUS | Status: AC
  Administered 2021-04-08: 0.25 mg via INTRAVENOUS
  Filled 2021-04-08: qty 5

## 2021-04-08 NOTE — Patient Instructions (Signed)
[facility] AT [place]   Discharge Instructions: Thank you for choosing [facility] to provide your oncology and hematology care.  If you have a lab appointment with the [place], please go directly to the [facility] and check in at the registration area.   Wear comfortable clothing and clothing appropriate for easy access to any Portacath or PICC line.   We strive to give you quality time with your provider. You may need to reschedule your appointment if you arrive late (15 or more minutes).  Arriving late affects you and other patients whose appointments are after yours.  Also, if you miss three or more appointments without notifying the office, you may be dismissed from the clinic at the providers discretion.      For prescription refill requests, have your pharmacy contact our office and allow 72 hours for refills to be completed.    Today you received the following chemotherapy and/or immunotherapy agents: Padcev.      To help prevent nausea and vomiting after your treatment, we encourage you to take your nausea medication as directed.  BELOW ARE SYMPTOMS THAT SHOULD BE REPORTED IMMEDIATELY: *FEVER GREATER THAN 100.4 F (38 C) OR HIGHER *CHILLS OR SWEATING *NAUSEA AND VOMITING THAT IS NOT CONTROLLED WITH YOUR NAUSEA MEDICATION *UNUSUAL SHORTNESS OF BREATH *UNUSUAL BRUISING OR BLEEDING *URINARY PROBLEMS (pain or burning when urinating, or frequent urination) *BOWEL PROBLEMS (unusual diarrhea, constipation, pain near the anus) TENDERNESS IN MOUTH AND THROAT WITH OR WITHOUT PRESENCE OF ULCERS (sore throat, sores in mouth, or a toothache) UNUSUAL RASH, SWELLING OR PAIN  UNUSUAL VAGINAL DISCHARGE OR ITCHING   Items with * indicate a potential emergency and should be followed up as soon as possible or go to the Emergency Department if any problems should occur.  Please show the CHEMOTHERAPY ALERT CARD or IMMUNOTHERAPY ALERT CARD at check-in to  the Emergency Department and triage nurse.  Should you have questions after your visit or need to cancel or reschedule your appointment, please contact [facility] AT [place]  Dept: [phone]  and follow the prompts.  Office hours are 8:00 a.m. to 4:30 p.m. Monday - Friday. Please note that voicemails left after 4:00 p.m. may not be returned until the following business day.  We are closed weekends and major holidays. You have access to a nurse at all times for urgent questions. Please call the main number to the clinic Dept: [phone] and follow the prompts.  For any non-urgent questions, you may also contact your provider using MyChart. We now offer e-Visits for anyone 47 and older to request care online for non-urgent symptoms. For details visit [email or web address].   Also download the MyChart app! Go to the app store, search "MyChart", open the app, select [place], and log in with your MyChart username and password.  Due to Covid, a mask is required upon entering the hospital/clinic. If you do not have a mask, one will be given to you upon arrival. For doctor visits, patients may have 1 support person aged 20 or older with them. For treatment visits, patients cannot have anyone with them due to current Covid guidelines and our immunocompromised population.

## 2021-04-08 NOTE — Progress Notes (Signed)
Hematology/Oncology Consult note [facility]  [phone] [phone]  Patient Care Team: Idelle Crouch, MD as PCP - General (Internal Medicine) Sindy Guadeloupe, MD as Consulting Physician (Hematology and Oncology) Sindy Guadeloupe, MD as Consulting Physician (Hematology and Oncology) Sindy Guadeloupe, MD as Consulting Physician (Hematology and Oncology)   Name of the patient: Anthony Gonzales  [id number]  1931/08/17   Date of visit: 04/08/21  Diagnosis- Metastatic urothelial carcinoma with possible mets to the obturator node. Indeterminate hilar lymph nodes and LUL lung nodule currently in remission  Chief complaint/ Reason for visit-discuss CT scan results.  On treatment assessment prior to cycle 2-day 1 of Padcev  Heme/Onc history: patient is a 88 year old male with past medical history significant for hypertension and long-standing history of superficial bladder cancer for which he sees [name].  He has undergone TURBT as well as ureteroscopy since 2017 along with mitomycin as well in the past.  Most recently he underwent CT abdomen on 08/06/2017 which showed a soft tissue mass in the right renal pelvis concerning for upper tract urothelial neoplasm.  Soft tissue fullness at the right ureterovesical junction with proximal right hydroureteronephrosis.  Several millimeter attenuation lesion in the pancreatic head.   He underwent diagnostic ureteroscopy and was found to have 2 high-grade lesions within his right kidney.  There was a nodular high-grade appearing lesion in the right anterior renal pelvis.  There was also a second ureteral tumor fungating from the right ureteral orifice extending into the distal ureter also consistent with high-grade invasive urothelial carcinoma.  Muscle invasion could not be assessed.  He also underwent a CT chest which showed a rounded nodule in the right upper lobe measuring 14 mm concerning for metastases.  2  other 3 mm lesions were also noted in the left upper lobe likely benign   Plan initially was neoadjuvant chemotherapy followed by possible surgery but given the presence of lung lesion patient has been referred to oncology for the same.   PET/CT on 09/21/17 showed: IMPRESSION: 1. Hypermetabolic right obturator lymph node is most indicative of metastatic disease. 2. Mildly hypermetabolic left hilar lymph nodes are nonspecific. Continued attention on follow-up exams is warranted. 3. Right upper lobe pulmonary nodule shows metabolism after just above blood pool and is therefore indeterminate. Continued attention on follow-up exams is warranted. 4. Aortic atherosclerosis (ICD10-170.0). Coronary artery calcification. 5. Cholelithiasis    Carboplatin/ gemzar 1 week on and one-week off as patient could not tolerate 2-week on and one week off regimen.  Cycle 1 started on 09/22/2017 Disease progression in March 2020.  Switched to second [employer]   Patient had disease controlled with Keytruda.  However he was noted to have worsening dermatitis with constant flareups requiring steroids.  Beryle Flock is therefore being kept on hold and patient will be switched to third line Padcev.    Treatment has been on hold since February 2021 due to constant flareup of skin rash   Scans in October 2022 showed disease progression with new lung nodules as well as pelvic sidewall adenopathy.  Plan is to therefore restart Padcev  Interval history-patient reports having an erythematous rash on his left forearm for which she is using topical emollient cream which has been helping with his itching.  Patient states that otherwise the rash does not bother him  ECOG PS- 1 Pain scale- 0   Review of systems- Review of Systems  Constitutional:  Positive for malaise/fatigue. Negative for chills,  fever and weight loss.  HENT:  Negative for congestion, ear discharge and nosebleeds.   Eyes:  Negative for blurred vision.   Respiratory:  Negative for cough, hemoptysis, sputum production, shortness of breath and wheezing.   Cardiovascular:  Negative for chest pain, palpitations, orthopnea and claudication.  Gastrointestinal:  Negative for abdominal pain, blood in stool, constipation, diarrhea, heartburn, melena, nausea and vomiting.  Genitourinary:  Negative for dysuria, flank pain, frequency, hematuria and urgency.  Musculoskeletal:  Negative for back pain, joint pain and myalgias.  Skin:  Negative for rash.  Neurological:  Negative for dizziness, tingling, focal weakness, seizures, weakness and headaches.  Endo/Heme/Allergies:  Does not bruise/bleed easily.  Psychiatric/Behavioral:  Negative for depression and suicidal ideas. The patient does not have insomnia.       Allergies  Allergen Reactions   Demerol [Meperidine] Nausea And Vomiting   Lipitor [Atorvastatin] Swelling   Sulfa Antibiotics Nausea And Vomiting and Rash     Past Medical History:  Diagnosis Date   Arthritis    Benign fibroma of prostate 08/23/2013   BPH (benign prostatic hyperplasia)    Calculus of kidney 08/23/2013   Glaucoma    no drops in 3 mo pressure good, pt denies glaucoma, eye pressure has been measuring alright.   History of kidney stones    HLD (hyperlipidemia)    HOH (hard of hearing)    Left Hearing Aid   HTN (hypertension) 12/26/2014   Hypertension    Hyponatremia 12/26/2014   Migraines    history of migraines when he was younger.   Restless leg syndrome    Sinus drainage    Skin cancer    Skin cancer    left hand 06/2019   Urothelial cancer ([place])    chemo tx's.   UTI (lower urinary tract infection) 12/26/2014   Vertigo      Past Surgical History:  Procedure Laterality Date   COLONOSCOPY     CYSTOSCOPY W/ RETROGRADES Right 01/30/2015   Procedure: CYSTOSCOPY WITH RETROGRADE PYELOGRAM;  Surgeon: [name], MD;  Location: [facility];  Service: Urology;  Laterality: Right;   CYSTOSCOPY W/ RETROGRADES  Bilateral 02/26/2016   Procedure: CYSTOSCOPY WITH RETROGRADE PYELOGRAM;  Surgeon: [name], MD;  Location: [facility];  Service: Urology;  Laterality: Bilateral;   CYSTOSCOPY W/ URETERAL STENT PLACEMENT Right 08/20/2015   Procedure: CYSTOSCOPY WITH RETROGRADE PYELOGRAM/POSSIBLE URETERAL STENT PLACEMENT/BLADDER BIOPSY;  Surgeon: [name], MD;  Location: [facility];  Service: Urology;  Laterality: Right;   CYSTOSCOPY W/ URETERAL STENT PLACEMENT Right 09/12/2015   Procedure: CYSTOSCOPY WITH STENT REPLACEMENT;  Surgeon: [name], MD;  Location: [facility];  Service: Urology;  Laterality: Right;   CYSTOSCOPY WITH BIOPSY Right 09/12/2015   Procedure: CYSTOSCOPY WITH BLADDER AND URETERAL BIOPSY;  Surgeon: [name], MD;  Location: [facility];  Service: Urology;  Laterality: Right;   CYSTOSCOPY WITH STENT PLACEMENT Right 01/30/2015   Procedure: CYSTOSCOPY WITH STENT PLACEMENT;  Surgeon: [name], MD;  Location: [facility];  Service: Urology;  Laterality: Right;   CYSTOSCOPY WITH STENT PLACEMENT Right 12/28/2017   Procedure: CYSTOSCOPY WITH STENT Exchange;  Surgeon: [name], MD;  Location: [facility];  Service: Urology;  Laterality: Right;   CYSTOSCOPY/URETEROSCOPY/HOLMIUM LASER/STENT PLACEMENT Right 08/19/2017   Procedure: CYSTOSCOPY/URETEROSCOPY/HOLMIUM LASER/STENT PLACEMENT;  Surgeon: [name], MD;  Location: [facility];  Service: Urology;  Laterality: Right;   EYE SURGERY Bilateral    Cataract Extraction with IOL   goiter removal     HOLMIUM LASER APPLICATION N/A 6/44/0347  Procedure:  HOLMIUM LASER APPLICATION;  Surgeon: [name], MD;  Location: [facility];  Service: Urology;  Laterality: N/A;   PORTA CATH INSERTION N/A 09/23/2017   Procedure: PORTA CATH INSERTION;  Surgeon: Algernon Huxley, MD;  Location: [facility];  Service: Cardiovascular;  Laterality: N/A;   SPERMATOCELECTOMY     TONSILLECTOMY     TRANSURETHRAL RESECTION OF BLADDER TUMOR N/A 02/26/2016    Procedure: TRANSURETHRAL RESECTION OF BLADDER TUMOR (TURBT);  Surgeon: [name], MD;  Location: [facility];  Service: Urology;  Laterality: N/A;   TRANSURETHRAL RESECTION OF BLADDER TUMOR WITH MITOMYCIN-C N/A 09/12/2015   Procedure: TRANSURETHRAL RESECTION OF BLADDER TUMOR ;  Surgeon: [name], MD;  Location: [facility];  Service: Urology;  Laterality: N/A;   TRANSURETHRAL RESECTION OF BLADDER TUMOR WITH MITOMYCIN-C N/A 03/24/2016   Procedure: TRANSURETHRAL RESECTION OF BLADDER TUMOR WITH MITOMYCIN-C  (SMALL);  Surgeon: [name], MD;  Location: [facility];  Service: Urology;  Laterality: N/A;   URETERAL BIOPSY Right 08/19/2017   Procedure: Renal Mass BIOPSY;  Surgeon: [name], MD;  Location: [facility];  Service: Urology;  Laterality: Right;   URETEROSCOPY Right 01/30/2015   Procedure: URETEROSCOPY/ WITH BIOPSY AND CYTOLOGY BRUSHING;  Surgeon: [name], MD;  Location: [facility];  Service: Urology;  Laterality: Right;   URETEROSCOPY Right 08/20/2015   Procedure: URETEROSCOPY;  Surgeon: [name], MD;  Location: [facility];  Service: Urology;  Laterality: Right;   URETEROSCOPY Right 09/12/2015   Procedure: URETEROSCOPY;  Surgeon: [name], MD;  Location: [facility];  Service: Urology;  Laterality: Right;   URETEROSCOPY Right 02/26/2016   Procedure: URETEROSCOPY;  Surgeon: [name], MD;  Location: [facility];  Service: Urology;  Laterality: Right;    Social History   Socioeconomic History   Marital status: Married    Spouse name: Not on file   Number of children: Not on file   Years of education: Not on file   Highest education level: Not on file  Occupational History   Not on file  Tobacco Use   Smoking status: Never   Smokeless tobacco: Never  Vaping Use   Vaping Use: Never used  Substance and Sexual Activity   Alcohol use: No    Alcohol/week: 0.0 standard drinks   Drug use: No   Sexual activity: Not Currently  Other Topics Concern   Not on file   Social History Narrative   Not on file   Social Determinants of Health   Financial Resource Strain: Not on file  Food Insecurity: Not on file  Transportation Needs: Not on file  Physical Activity: Not on file  Stress: Not on file  Social Connections: Not on file  Intimate Partner Violence: Not on file    Family History  Problem Relation Age of Onset   Hypertension Mother    Hypertension Father    Prostate cancer Brother    Kidney disease Neg Hx    Kidney cancer Neg Hx    Bladder Cancer Neg Hx      Current Outpatient Medications:    acetaminophen (TYLENOL) 500 MG tablet, Take by mouth., Disp: , Rfl:    amLODipine (NORVASC) 5 MG tablet, Take 5 mg by mouth daily. , Disp: , Rfl:    camphor-menthol (SARNA) lotion, Apply 1 application topically as needed for itching., Disp: , Rfl:    diazepam (VALIUM) 5 MG tablet, TAKE 1 TABLET (5 MG TOTAL) BY MOUTH EVERY 12 (TWELVE) HOURS AS NEEDED FOR ANXIETY OR SLEEP, Disp: ,  Rfl:    docusate sodium (COLACE) 250 MG capsule, Take 250 mg by mouth daily., Disp: , Rfl:    fenofibrate micronized (LOFIBRA) 134 MG capsule, Take 134 mg by mouth daily before breakfast. , Disp: , Rfl:    folic acid (FOLVITE) 1 MG tablet, Take by mouth., Disp: , Rfl:    gemfibrozil (LOPID) 600 MG tablet, Take by mouth., Disp: , Rfl:    HYDROcodone-acetaminophen (NORCO/VICODIN) 5-325 MG tablet, Take 1 tablet by mouth every 6 (six) hours as needed for severe pain., Disp: 30 tablet, Rfl: 0   Multiple Vitamin (MULTIVITAMIN WITH MINERALS) TABS tablet, Take 1 tablet by mouth daily., Disp: , Rfl:    nystatin (MYCOSTATIN/NYSTOP) powder, Apply 1 application topically 3 (three) times daily. To under arms area that has rash, Disp: 30 g, Rfl: 0   polyethylene glycol (MIRALAX / GLYCOLAX) 17 g packet, Take 17 g by mouth daily., Disp: , Rfl:    prochlorperazine (COMPAZINE) 10 MG tablet, Take 1 tablet (10 mg total) by mouth every 6 (six) hours as needed for nausea or vomiting., Disp: 30  tablet, Rfl: 0   psyllium (METAMUCIL) 58.6 % packet, Take 1 packet by mouth daily., Disp: , Rfl:    senna (SENOKOT) 8.6 MG tablet, Take 1 tablet by mouth daily., Disp: , Rfl:    tamsulosin (FLOMAX) 0.4 MG CAPS capsule, Take 1 capsule (0.4 mg total) by mouth daily., Disp: 90 capsule, Rfl: 3   triamcinolone cream (KENALOG) 0.1 %, Apply 1 application topically 2 (two) times daily., Disp: , Rfl:    urea (CARMOL) 10 % cream, Apply topically 3 (three) times daily. NOT THE HANDS UNTIL IT CLEARS UP, Disp: 71 g, Rfl: 0   benazepril-hydrochlorthiazide (LOTENSIN HCT) 20-12.5 MG tablet, Take 1 tablet by mouth daily.  (Patient not taking: Reported on 03/18/2021), Disp: , Rfl:    LAGEVRIO 200 MG CAPS capsule, Take 4 capsules by mouth 2 (two) times daily. (Patient not taking: Reported on 03/18/2021), Disp: , Rfl:  No current facility-administered medications for this visit.  Facility-Administered Medications Ordered in Other Visits:    heparin lock flush 100 UNIT/ML injection, , , ,    heparin lock flush 100 UNIT/ML injection, , , ,    sodium chloride flush (NS) 0.9 % injection 10 mL, 10 mL, Intravenous, Once, Sindy Guadeloupe, MD   sodium chloride flush (NS) 0.9 % injection 10 mL, 10 mL, Intravenous, PRN, Sindy Guadeloupe, MD   sodium chloride flush (NS) 0.9 % injection 10 mL, 10 mL, Intracatheter, PRN, Sindy Guadeloupe, MD, 10 mL at 04/08/21 1438  Physical exam:  Vitals:   04/08/21 1402  BP: (!) 144/78  Pulse: 71  Resp: 16  Temp: 98.7 F (37.1 C)  TempSrc: Tympanic  SpO2: 98%  Weight: 193 lb 12.8 oz (87.9 kg)  Height: 6' (1.829 m)   Physical Exam Constitutional:      General: He is not in acute distress. Cardiovascular:     Rate and Rhythm: Normal rate and regular rhythm.     Heart sounds: Normal heart sounds.  Pulmonary:     Effort: Pulmonary effort is normal.     Breath sounds: Normal breath sounds.  Abdominal:     General: Bowel sounds are normal.     Palpations: Abdomen is soft.  Skin:     Comments: Ill-defined maculopapular erythematous rash noted over left forearm.  No obvious rash noted over back but a small erythematous area noted over the xiphoid sternum  Neurological:  Mental Status: He is alert and oriented to person, place, and time.     CMP Latest Ref Rng & Units 04/08/2021  Glucose 70 - 99 mg/dL 119(H)  BUN 8 - 23 mg/dL 21  Creatinine 0.61 - 1.24 mg/dL 0.98  Sodium 135 - 145 mmol/L 133(L)  Potassium 3.5 - 5.1 mmol/L 3.5  Chloride 98 - 111 mmol/L 99  CO2 22 - 32 mmol/L 27  Calcium 8.9 - 10.3 mg/dL 8.8(L)  Total Protein 6.5 - 8.1 g/dL 6.4(L)  Total Bilirubin 0.3 - 1.2 mg/dL 0.2(L)  Alkaline Phos 38 - 126 U/L 45  AST 15 - 41 U/L 32  ALT 0 - 44 U/L 21   CBC Latest Ref Rng & Units 04/08/2021  WBC 4.0 - 10.5 K/uL 6.5  Hemoglobin 13.0 - 17.0 g/dL 13.4  Hematocrit 39.0 - 52.0 % 38.6(L)  Platelets 150 - 400 K/uL 278    No images are attached to the encounter.  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 On chemotherapy. Asymptomatic. EXAM: CT CHEST, ABDOMEN, AND PELVIS WITH CONTRAST TECHNIQUE: Multidetector CT imaging of the chest, abdomen and pelvis was performed following the standard protocol during bolus administration of intravenous contrast. RADIATION DOSE REDUCTION: This exam was performed according to the departmental dose-optimization program which includes automated exposure control, adjustment of the mA and/or kV according to patient size and/or use of iterative reconstruction technique. CONTRAST:  130mL OMNIPAQUE IOHEXOL 300 MG/ML  SOLN COMPARISON:  01/03/2021 FINDINGS: CT CHEST FINDINGS Cardiovascular: Right Port-A-Cath tip low SVC. Aortic atherosclerosis. Tortuous thoracic aorta. Normal heart size, without pericardial effusion. Lad coronary artery calcification. No central pulmonary embolism, on this non-dedicated study. Mediastinum/Nodes: No  supraclavicular adenopathy. No mediastinal or hilar adenopathy. Lungs/Pleura: No pleural fluid. Inferior right upper lobe index pulmonary nodule measures 1.3 by 1.1 cm on 78/3 versus similar on the prior exam (when remeasured). Subpleural left lower lobe pulmonary nodule anteriorly measures 5 mm on 92/3 versus 10 mm on the prior exam. Right lower lobe subpleural pulmonary nodule has resolved. No new or enlarging nodules. Musculoskeletal: Moderate bilateral gynecomastia is not significantly changed. No acute osseous abnormality. CT ABDOMEN PELVIS FINDINGS Hepatobiliary: Normal liver. Multiple gallstones on the order 5 mm. No acute cholecystitis or biliary duct dilatation. Pancreas: Pancreatic atrophy is likely within normal variation for age. Cystic lesions within the pancreatic head including at up to 1.3 cm are not changed and of doubtful clinical significance given patient age and comorbidities. No duct dilatation or acute inflammation. Spleen: Normal in size, without focal abnormality. Adrenals/Urinary Tract: Normal adrenal glands. Lower pole right renal cyst of 3.7 cm. Left renal too small to characterize lesions. No hydronephrosis. Normal urinary bladder. Stomach/Bowel: Normal stomach, without wall thickening. Scattered colonic diverticula. Normal terminal ileum. Normal small bowel. Vascular/Lymphatic: Aortic atherosclerosis. No abdominal adenopathy. The right internal iliac nodal metastasis measures 2.1 x 1.5 cm on 114/2. Compare 3.2 x 3.1 cm on the prior. Reproductive: Normal prostate. Other: Small left-sided bladder diverticulum just posterior to the ureterovesicular junction. This is unchanged. No bladder mass. Musculoskeletal: Lumbosacral spondylosis. Transitional S1 vertebral body. IMPRESSION: CT CHEST IMPRESSION 1. Response to therapy of pulmonary metastasis. Although the dominant inferior right upper lobe pulmonary nodule is similar in size, other smaller nodules are decreased and resolved. 2. No  thoracic adenopathy. 3. Chronic moderate gynecomastia. 4. Coronary artery atherosclerosis. Aortic Atherosclerosis (ICD10-I70.0). CT ABDOMEN AND PELVIS IMPRESSION 1. Response to  therapy of right internal iliac nodal metastasis. 2. No new or progressive disease. 3. Cholelithiasis. Electronically Signed   By: Abigail Miyamoto M.D.   On: 04/05/2021 13:29     Assessment and plan- Patient is a 88 y.o. male with metastatic urothelial carcinoma with metastases to lungs and pelvic adenopathy.  He is here to discuss CT scan results and on treatment assessment prior to cycle 9-day 1 of Padcev  I have reviewed CT chest abdomen pelvis images independently and discussed findings with the patient which overall shows response to treatmentAs evidenced by reduction in the size of lung nodules as well as internal iliac adenopathy.  No evidence of progressive disease.  Plan is to continue Padcev until progression or toxicity  Patient has experienced skin rash when we had given him Padcev previously and now we are again noticing erythematous rash in his left forearm for which she is using topical Sarna cream.  I will reach out to Dr. Evorn Gong who has also been seeing him for his skin rash over the last couple of years.  I would ideally like to continue Padcev while trying to manage his skin rash especially given his good response to treatment   Visit Diagnosis 1. Goals of care, counseling/discussion   2. Encounter for antineoplastic chemotherapy   3. Drug-induced skin rash   4. 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 Randa Evens, MD, MPH [facility] [phone] 04/08/2021 4:20 PM

## 2021-04-15 ENCOUNTER — Other Ambulatory Visit: Payer: Self-pay

## 2021-04-15 ENCOUNTER — Inpatient Hospital Stay: Payer: PPO | Attending: Oncology

## 2021-04-15 ENCOUNTER — Inpatient Hospital Stay: Payer: PPO

## 2021-04-15 VITALS — BP 156/85 | HR 70 | Temp 97.2°F | Resp 16 | Wt 194.6 lb

## 2021-04-15 DIAGNOSIS — K8020 Calculus of gallbladder without cholecystitis without obstruction: Secondary | ICD-10-CM | POA: Diagnosis not present

## 2021-04-15 DIAGNOSIS — C669 Malignant neoplasm of unspecified ureter: Secondary | ICD-10-CM

## 2021-04-15 DIAGNOSIS — Z8249 Family history of ischemic heart disease and other diseases of the circulatory system: Secondary | ICD-10-CM | POA: Insufficient documentation

## 2021-04-15 DIAGNOSIS — Z9221 Personal history of antineoplastic chemotherapy: Secondary | ICD-10-CM | POA: Insufficient documentation

## 2021-04-15 DIAGNOSIS — Z87442 Personal history of urinary calculi: Secondary | ICD-10-CM | POA: Insufficient documentation

## 2021-04-15 DIAGNOSIS — N62 Hypertrophy of breast: Secondary | ICD-10-CM | POA: Insufficient documentation

## 2021-04-15 DIAGNOSIS — Z87440 Personal history of urinary (tract) infections: Secondary | ICD-10-CM | POA: Diagnosis not present

## 2021-04-15 DIAGNOSIS — Z8042 Family history of malignant neoplasm of prostate: Secondary | ICD-10-CM | POA: Diagnosis not present

## 2021-04-15 DIAGNOSIS — C7801 Secondary malignant neoplasm of right lung: Secondary | ICD-10-CM | POA: Insufficient documentation

## 2021-04-15 DIAGNOSIS — C775 Secondary and unspecified malignant neoplasm of intrapelvic lymph nodes: Secondary | ICD-10-CM | POA: Diagnosis not present

## 2021-04-15 DIAGNOSIS — Z882 Allergy status to sulfonamides status: Secondary | ICD-10-CM | POA: Insufficient documentation

## 2021-04-15 DIAGNOSIS — K8689 Other specified diseases of pancreas: Secondary | ICD-10-CM | POA: Diagnosis not present

## 2021-04-15 DIAGNOSIS — C661 Malignant neoplasm of right ureter: Secondary | ICD-10-CM | POA: Insufficient documentation

## 2021-04-15 DIAGNOSIS — Z79899 Other long term (current) drug therapy: Secondary | ICD-10-CM | POA: Diagnosis not present

## 2021-04-15 DIAGNOSIS — Z885 Allergy status to narcotic agent status: Secondary | ICD-10-CM | POA: Insufficient documentation

## 2021-04-15 DIAGNOSIS — I700 Atherosclerosis of aorta: Secondary | ICD-10-CM | POA: Insufficient documentation

## 2021-04-15 DIAGNOSIS — M47817 Spondylosis without myelopathy or radiculopathy, lumbosacral region: Secondary | ICD-10-CM | POA: Insufficient documentation

## 2021-04-15 DIAGNOSIS — N400 Enlarged prostate without lower urinary tract symptoms: Secondary | ICD-10-CM | POA: Insufficient documentation

## 2021-04-15 DIAGNOSIS — Z5112 Encounter for antineoplastic immunotherapy: Secondary | ICD-10-CM | POA: Insufficient documentation

## 2021-04-15 DIAGNOSIS — N281 Cyst of kidney, acquired: Secondary | ICD-10-CM | POA: Diagnosis not present

## 2021-04-15 DIAGNOSIS — N323 Diverticulum of bladder: Secondary | ICD-10-CM | POA: Diagnosis not present

## 2021-04-15 DIAGNOSIS — Z85828 Personal history of other malignant neoplasm of skin: Secondary | ICD-10-CM | POA: Insufficient documentation

## 2021-04-15 DIAGNOSIS — L270 Generalized skin eruption due to drugs and medicaments taken internally: Secondary | ICD-10-CM | POA: Diagnosis not present

## 2021-04-15 DIAGNOSIS — K862 Cyst of pancreas: Secondary | ICD-10-CM | POA: Insufficient documentation

## 2021-04-15 LAB — CBC WITH DIFFERENTIAL/PLATELET
Abs Immature Granulocytes: 0.02 10*3/uL (ref 0.00–0.07)
Basophils Absolute: 0.1 10*3/uL (ref 0.0–0.1)
Basophils Relative: 1 %
Eosinophils Absolute: 0.1 10*3/uL (ref 0.0–0.5)
Eosinophils Relative: 1 %
HCT: 40 % (ref 39.0–52.0)
Hemoglobin: 13.6 g/dL (ref 13.0–17.0)
Immature Granulocytes: 0 %
Lymphocytes Relative: 33 %
Lymphs Abs: 1.9 10*3/uL (ref 0.7–4.0)
MCH: 29.9 pg (ref 26.0–34.0)
MCHC: 34 g/dL (ref 30.0–36.0)
MCV: 87.9 fL (ref 80.0–100.0)
Monocytes Absolute: 0.8 10*3/uL (ref 0.1–1.0)
Monocytes Relative: 14 %
Neutro Abs: 2.9 10*3/uL (ref 1.7–7.7)
Neutrophils Relative %: 51 %
Platelets: 269 10*3/uL (ref 150–400)
RBC: 4.55 MIL/uL (ref 4.22–5.81)
RDW: 15.2 % (ref 11.5–15.5)
WBC: 5.7 10*3/uL (ref 4.0–10.5)
nRBC: 0 % (ref 0.0–0.2)

## 2021-04-15 LAB — COMPREHENSIVE METABOLIC PANEL
ALT: 20 U/L (ref 0–44)
AST: 33 U/L (ref 15–41)
Albumin: 3.7 g/dL (ref 3.5–5.0)
Alkaline Phosphatase: 45 U/L (ref 38–126)
Anion gap: 6 (ref 5–15)
BUN: 18 mg/dL (ref 8–23)
CO2: 28 mmol/L (ref 22–32)
Calcium: 8.7 mg/dL — ABNORMAL LOW (ref 8.9–10.3)
Chloride: 102 mmol/L (ref 98–111)
Creatinine, Ser: 0.91 mg/dL (ref 0.61–1.24)
GFR, Estimated: >60 mL/min (ref >60)
Glucose, Bld: 128 mg/dL — ABNORMAL HIGH (ref 70–99)
Potassium: 3.6 mmol/L (ref 3.5–5.1)
Sodium: 136 mmol/L (ref 135–145)
Total Bilirubin: 0.5 mg/dL (ref 0.3–1.2)
Total Protein: 6.4 g/dL — ABNORMAL LOW (ref 6.5–8.1)

## 2021-04-15 MED ORDER — SODIUM CHLORIDE 0.9 % IV SOLN
10.0000 mg | Freq: Once | INTRAVENOUS | Status: AC
  Administered 2021-04-15: 10 mg via INTRAVENOUS
  Filled 2021-04-15: qty 10

## 2021-04-15 MED ORDER — HEPARIN SOD (PORK) LOCK FLUSH 100 UNIT/ML IV SOLN
500.0000 [IU] | Freq: Once | INTRAVENOUS | Status: AC | PRN
  Filled 2021-04-15: qty 5

## 2021-04-15 MED ORDER — SODIUM CHLORIDE 0.9 % IV SOLN
Freq: Once | INTRAVENOUS | Status: AC
  Filled 2021-04-15: qty 250

## 2021-04-15 MED ORDER — SODIUM CHLORIDE 0.9 % IV SOLN
90.0000 mg | Freq: Once | INTRAVENOUS | Status: AC
  Administered 2021-04-15: 90 mg via INTRAVENOUS
  Filled 2021-04-15: qty 9

## 2021-04-15 MED ORDER — PALONOSETRON HCL INJECTION 0.25 MG/5ML
0.2500 mg | Freq: Once | INTRAVENOUS | Status: AC
  Administered 2021-04-15: 0.25 mg via INTRAVENOUS
  Filled 2021-04-15: qty 5

## 2021-04-15 MED ORDER — HEPARIN SOD (PORK) LOCK FLUSH 100 UNIT/ML IV SOLN
INTRAVENOUS | Status: AC
  Administered 2021-04-15: 500 [IU]
  Filled 2021-04-15: qty 5

## 2021-04-15 NOTE — Progress Notes (Signed)
Patient states MD wanted him to make RN aware if his existing rash worsened. Patient wanted to give an update regarding rash and states that the rash on abdomen has improved and the rash on his arms remains the same and is not bothering him. MD was already aware of this rash with previous MD visit. No other changes per patient.

## 2021-04-15 NOTE — Patient Instructions (Signed)
[facility] [place]  Discharge Instructions: Thank you for choosing [facility] to provide your oncology and hematology care.  If you have a lab appointment with the [facility], please go directly to the [facility] and check in at the registration area.  Wear comfortable clothing and clothing appropriate for easy access to any Portacath or PICC line.   We strive to give you quality time with your provider. You may need to reschedule your appointment if you arrive late (15 or more minutes).  Arriving late affects you and other patients whose appointments are after yours.  Also, if you miss three or more appointments without notifying the office, you may be dismissed from the clinic at the providers discretion.      For prescription refill requests, have your pharmacy contact our office and allow 72 hours for refills to be completed.    Today you received the following chemotherapy and/or immunotherapy agents Padcev      To help prevent nausea and vomiting after your treatment, we encourage you to take your nausea medication as directed.  BELOW ARE SYMPTOMS THAT SHOULD BE REPORTED IMMEDIATELY: *FEVER GREATER THAN 100.4 F (38 C) OR HIGHER *CHILLS OR SWEATING *NAUSEA AND VOMITING THAT IS NOT CONTROLLED WITH YOUR NAUSEA MEDICATION *UNUSUAL SHORTNESS OF BREATH *UNUSUAL BRUISING OR BLEEDING *URINARY PROBLEMS (pain or burning when urinating, or frequent urination) *BOWEL PROBLEMS (unusual diarrhea, constipation, pain near the anus) TENDERNESS IN MOUTH AND THROAT WITH OR WITHOUT PRESENCE OF ULCERS (sore throat, sores in mouth, or a toothache) UNUSUAL RASH, SWELLING OR PAIN  UNUSUAL VAGINAL DISCHARGE OR ITCHING   Items with * indicate a potential emergency and should be followed up as soon as possible or go to the Emergency Department if any problems should occur.  Please show the CHEMOTHERAPY ALERT CARD or IMMUNOTHERAPY ALERT CARD at check-in to the  Emergency Department and triage nurse.  Should you have questions after your visit or need to cancel or reschedule your appointment, please contact [facility] [place] AT [place]  [phone] and follow the prompts.  Office hours are 8:00 a.m. to 4:30 p.m. Monday - Friday. Please note that voicemails left after 4:00 p.m. may not be returned until the following business day.  We are closed weekends and major holidays. You have access to a nurse at all times for urgent questions. Please call the main number to the clinic [phone] and follow the prompts.  For any non-urgent questions, you may also contact your provider using MyChart. We now offer e-Visits for anyone 86 and older to request care online for non-urgent symptoms. For details visit [email or web address].   Also download the MyChart app! Go to the app store, search "MyChart", open the app, select [facility], and log in with your MyChart username and password.  Due to Covid, a mask is required upon entering the hospital/clinic. If you do not have a mask, one will be given to you upon arrival. For doctor visits, patients may have 1 support person aged 86 or older with them. For treatment visits, patients cannot have anyone with them due to current Covid guidelines and our immunocompromised population.

## 2021-04-18 DIAGNOSIS — C7801 Secondary malignant neoplasm of right lung: Secondary | ICD-10-CM | POA: Diagnosis not present

## 2021-04-18 DIAGNOSIS — I10 Essential (primary) hypertension: Secondary | ICD-10-CM | POA: Diagnosis not present

## 2021-04-18 DIAGNOSIS — C7900 Secondary malignant neoplasm of unspecified kidney and renal pelvis: Secondary | ICD-10-CM | POA: Diagnosis not present

## 2021-04-18 DIAGNOSIS — Z9221 Personal history of antineoplastic chemotherapy: Secondary | ICD-10-CM | POA: Diagnosis not present

## 2021-04-18 DIAGNOSIS — Z8739 Personal history of other diseases of the musculoskeletal system and connective tissue: Secondary | ICD-10-CM | POA: Diagnosis not present

## 2021-04-18 DIAGNOSIS — I739 Peripheral vascular disease, unspecified: Secondary | ICD-10-CM | POA: Diagnosis not present

## 2021-04-18 DIAGNOSIS — E785 Hyperlipidemia, unspecified: Secondary | ICD-10-CM | POA: Diagnosis not present

## 2021-04-18 DIAGNOSIS — C679 Malignant neoplasm of bladder, unspecified: Secondary | ICD-10-CM | POA: Diagnosis not present

## 2021-04-18 DIAGNOSIS — C7982 Secondary malignant neoplasm of genital organs: Secondary | ICD-10-CM | POA: Diagnosis not present

## 2021-04-29 ENCOUNTER — Inpatient Hospital Stay (HOSPITAL_BASED_OUTPATIENT_CLINIC_OR_DEPARTMENT_OTHER): Payer: PPO | Admitting: Oncology

## 2021-04-29 ENCOUNTER — Inpatient Hospital Stay: Payer: PPO

## 2021-04-29 ENCOUNTER — Encounter: Payer: Self-pay | Admitting: Oncology

## 2021-04-29 ENCOUNTER — Other Ambulatory Visit: Payer: Self-pay

## 2021-04-29 VITALS — BP 136/80 | HR 83 | Temp 97.1°F | Resp 17 | Wt 193.0 lb

## 2021-04-29 DIAGNOSIS — Z5111 Encounter for antineoplastic chemotherapy: Secondary | ICD-10-CM | POA: Diagnosis not present

## 2021-04-29 DIAGNOSIS — L270 Generalized skin eruption due to drugs and medicaments taken internally: Secondary | ICD-10-CM

## 2021-04-29 DIAGNOSIS — C669 Malignant neoplasm of unspecified ureter: Secondary | ICD-10-CM | POA: Diagnosis not present

## 2021-04-29 DIAGNOSIS — Z5112 Encounter for antineoplastic immunotherapy: Secondary | ICD-10-CM | POA: Diagnosis not present

## 2021-04-29 LAB — COMPREHENSIVE METABOLIC PANEL
ALT: 23 U/L (ref 0–44)
AST: 34 U/L (ref 15–41)
Albumin: 3.7 g/dL (ref 3.5–5.0)
Alkaline Phosphatase: 49 U/L (ref 38–126)
Anion gap: 7 (ref 5–15)
BUN: 16 mg/dL (ref 8–23)
CO2: 24 mmol/L (ref 22–32)
Calcium: 8.6 mg/dL — ABNORMAL LOW (ref 8.9–10.3)
Chloride: 102 mmol/L (ref 98–111)
Creatinine, Ser: 1.01 mg/dL (ref 0.61–1.24)
GFR, Estimated: >60 mL/min (ref >60)
Glucose, Bld: 144 mg/dL — ABNORMAL HIGH (ref 70–99)
Potassium: 3.7 mmol/L (ref 3.5–5.1)
Sodium: 133 mmol/L — ABNORMAL LOW (ref 135–145)
Total Bilirubin: 0.3 mg/dL (ref 0.3–1.2)
Total Protein: 6.8 g/dL (ref 6.5–8.1)

## 2021-04-29 LAB — CBC WITH DIFFERENTIAL/PLATELET
Abs Immature Granulocytes: 0.02 10*3/uL (ref 0.00–0.07)
Basophils Absolute: 0.1 10*3/uL (ref 0.0–0.1)
Basophils Relative: 1 %
Eosinophils Absolute: 0.1 10*3/uL (ref 0.0–0.5)
Eosinophils Relative: 1 %
HCT: 40.4 % (ref 39.0–52.0)
Hemoglobin: 13.8 g/dL (ref 13.0–17.0)
Immature Granulocytes: 0 %
Lymphocytes Relative: 27 %
Lymphs Abs: 1.7 10*3/uL (ref 0.7–4.0)
MCH: 29.9 pg (ref 26.0–34.0)
MCHC: 34.2 g/dL (ref 30.0–36.0)
MCV: 87.6 fL (ref 80.0–100.0)
Monocytes Absolute: 0.7 10*3/uL (ref 0.1–1.0)
Monocytes Relative: 11 %
Neutro Abs: 3.8 10*3/uL (ref 1.7–7.7)
Neutrophils Relative %: 60 %
Platelets: 295 10*3/uL (ref 150–400)
RBC: 4.61 MIL/uL (ref 4.22–5.81)
RDW: 15.9 % — ABNORMAL HIGH (ref 11.5–15.5)
WBC: 6.4 10*3/uL (ref 4.0–10.5)
nRBC: 0 % (ref 0.0–0.2)

## 2021-04-29 MED ORDER — SODIUM CHLORIDE 0.9 % IV SOLN
Freq: Once | INTRAVENOUS | Status: AC
  Filled 2021-04-29: qty 250

## 2021-04-29 MED ORDER — HEPARIN SOD (PORK) LOCK FLUSH 100 UNIT/ML IV SOLN
INTRAVENOUS | Status: AC
  Filled 2021-04-29: qty 5

## 2021-04-29 MED ORDER — SODIUM CHLORIDE 0.9 % IV SOLN
10.0000 mg | Freq: Once | INTRAVENOUS | Status: AC
  Administered 2021-04-29: 10 mg via INTRAVENOUS
  Filled 2021-04-29: qty 10

## 2021-04-29 MED ORDER — PALONOSETRON HCL INJECTION 0.25 MG/5ML
0.2500 mg | Freq: Once | INTRAVENOUS | Status: AC
  Administered 2021-04-29: 0.25 mg via INTRAVENOUS
  Filled 2021-04-29: qty 5

## 2021-04-29 MED ORDER — SODIUM CHLORIDE 0.9 % IV SOLN
90.0000 mg | Freq: Once | INTRAVENOUS | Status: AC
  Administered 2021-04-29: 90 mg via INTRAVENOUS
  Filled 2021-04-29: qty 9

## 2021-04-29 NOTE — Progress Notes (Signed)
Patient here for oncology follow-up appointment, concerns of constipation

## 2021-04-29 NOTE — Patient Instructions (Signed)
[facility] [place]  Discharge Instructions: Thank you for choosing [facility] to provide your oncology and hematology care.  If you have a lab appointment with the [facility], please go directly to the [facility] and check in at the registration area.  Wear comfortable clothing and clothing appropriate for easy access to any Portacath or PICC line.   We strive to give you quality time with your provider. You may need to reschedule your appointment if you arrive late (15 or more minutes).  Arriving late affects you and other patients whose appointments are after yours.  Also, if you miss three or more appointments without notifying the office, you may be dismissed from the clinic at the providers discretion.      For prescription refill requests, have your pharmacy contact our office and allow 72 hours for refills to be completed.    Today you received the following chemotherapy and/or immunotherapy agents : Padcev     To help prevent nausea and vomiting after your treatment, we encourage you to take your nausea medication as directed.  BELOW ARE SYMPTOMS THAT SHOULD BE REPORTED IMMEDIATELY: *FEVER GREATER THAN 100.4 F (38 C) OR HIGHER *CHILLS OR SWEATING *NAUSEA AND VOMITING THAT IS NOT CONTROLLED WITH YOUR NAUSEA MEDICATION *UNUSUAL SHORTNESS OF BREATH *UNUSUAL BRUISING OR BLEEDING *URINARY PROBLEMS (pain or burning when urinating, or frequent urination) *BOWEL PROBLEMS (unusual diarrhea, constipation, pain near the anus) TENDERNESS IN MOUTH AND THROAT WITH OR WITHOUT PRESENCE OF ULCERS (sore throat, sores in mouth, or a toothache) UNUSUAL RASH, SWELLING OR PAIN  UNUSUAL VAGINAL DISCHARGE OR ITCHING   Items with * indicate a potential emergency and should be followed up as soon as possible or go to the Emergency Department if any problems should occur.  Please show the CHEMOTHERAPY ALERT CARD or IMMUNOTHERAPY ALERT CARD at check-in to the  Emergency Department and triage nurse.  Should you have questions after your visit or need to cancel or reschedule your appointment, please contact [facility] [place] [facility] [place]  [phone] and follow the prompts.  Office hours are 8:00 a.m. to 4:30 p.m. Monday - Friday. Please note that voicemails left after 4:00 p.m. may not be returned until the following business day.  We are closed weekends and major holidays. You have access to a nurse at all times for urgent questions. Please call the main number to the clinic [phone] and follow the prompts.  For any non-urgent questions, you may also contact your provider using MyChart. We now offer e-Visits for anyone 59 and older to request care online for non-urgent symptoms. For details visit [email or web address].   Also download the MyChart app! Go to the app store, search "MyChart", open the app, select [facility], and log in with your MyChart username and password.  Due to Covid, a mask is required upon entering the hospital/clinic. If you do not have a mask, one will be given to you upon arrival. For doctor visits, patients may have 1 support person aged 31 or older with them. For treatment visits, patients cannot have anyone with them due to current Covid guidelines and our immunocompromised population.

## 2021-04-29 NOTE — Progress Notes (Signed)
Hematology/Oncology Consult note [facility]  Telephone:([phone] [phone]  Patient Care Team: Idelle Crouch, MD as PCP - General (Internal Medicine) Sindy Guadeloupe, MD as Consulting Physician (Hematology and Oncology) Sindy Guadeloupe, MD as Consulting Physician (Hematology and Oncology) Sindy Guadeloupe, MD as Consulting Physician (Hematology and Oncology)   Name of the patient: Anthony Gonzales  [id number]  Jul 23, 1931   Date of visit: 04/29/21  Diagnosis- Metastatic urothelial carcinoma with possible mets to the obturator node. Indeterminate hilar lymph nodes and LUL lung nodule currently in remission    Chief complaint/ Reason for visit-on treatment assessment prior to cycle 10-day 1 of Padcev  Heme/Onc history: patient is a 88 year old male with past medical history significant for hypertension and long-standing history of superficial bladder cancer for which he sees [name].  He has undergone TURBT as well as ureteroscopy since 2017 along with mitomycin as well in the past.  Most recently he underwent CT abdomen on 08/06/2017 which showed a soft tissue mass in the right renal pelvis concerning for upper tract urothelial neoplasm.  Soft tissue fullness at the right ureterovesical junction with proximal right hydroureteronephrosis.  Several millimeter attenuation lesion in the pancreatic head.   He underwent diagnostic ureteroscopy and was found to have 2 high-grade lesions within his right kidney.  There was a nodular high-grade appearing lesion in the right anterior renal pelvis.  There was also a second ureteral tumor fungating from the right ureteral orifice extending into the distal ureter also consistent with high-grade invasive urothelial carcinoma.  Muscle invasion could not be assessed.  He also underwent a CT chest which showed a rounded nodule in the right upper lobe measuring 14 mm concerning for metastases.  2 other 3 mm lesions  were also noted in the left upper lobe likely benign   Plan initially was neoadjuvant chemotherapy followed by possible surgery but given the presence of lung lesion patient has been referred to oncology for the same.   PET/CT on 09/21/17 showed: IMPRESSION: 1. Hypermetabolic right obturator lymph node is most indicative of metastatic disease. 2. Mildly hypermetabolic left hilar lymph nodes are nonspecific. Continued attention on follow-up exams is warranted. 3. Right upper lobe pulmonary nodule shows metabolism after just above blood pool and is therefore indeterminate. Continued attention on follow-up exams is warranted. 4. Aortic atherosclerosis (ICD10-170.0). Coronary artery calcification. 5. Cholelithiasis    Carboplatin/ gemzar 1 week on and one-week off as patient could not tolerate 2-week on and one week off regimen.  Cycle 1 started on 09/22/2017 Disease progression in March 2020.  Switched to second [employer]   Patient had disease controlled with Keytruda.  However he was noted to have worsening dermatitis with constant flareups requiring steroids.  Beryle Flock is therefore being kept on hold and patient will be switched to third line Padcev.    Treatment has been on hold since February 2021 due to constant flareup of skin rash   Scans in October 2022 showed disease progression with new lung nodules as well as pelvic sidewall adenopathy.  Plan is to therefore restart Padcev    Interval history-patient does report recurrence of his skin rash mainly in his bilateral forearms for which she has been putting Vaseline cream.  Rash is otherwise not been bothering him as much.  No rash noted over his trunk or back.  Denies other side effects from chemotherapy so far  ECOG PS- 1 Pain scale- 0   Review of systems-  Review of Systems  Constitutional:  Negative for chills, fever, malaise/fatigue and weight loss.  HENT:  Negative for congestion, ear discharge and nosebleeds.   Eyes:   Negative for blurred vision.  Respiratory:  Negative for cough, hemoptysis, sputum production, shortness of breath and wheezing.   Cardiovascular:  Negative for chest pain, palpitations, orthopnea and claudication.  Gastrointestinal:  Negative for abdominal pain, blood in stool, constipation, diarrhea, heartburn, melena, nausea and vomiting.  Genitourinary:  Negative for dysuria, flank pain, frequency, hematuria and urgency.  Musculoskeletal:  Negative for back pain, joint pain and myalgias.  Skin:  Positive for rash.  Neurological:  Negative for dizziness, tingling, focal weakness, seizures, weakness and headaches.  Endo/Heme/Allergies:  Does not bruise/bleed easily.  Psychiatric/Behavioral:  Negative for depression and suicidal ideas. The patient does not have insomnia.      Allergies  Allergen Reactions   Demerol [Meperidine] Nausea And Vomiting   Lipitor [Atorvastatin] Swelling   Sulfa Antibiotics Nausea And Vomiting and Rash     Past Medical History:  Diagnosis Date   Arthritis    Benign fibroma of prostate 08/23/2013   BPH (benign prostatic hyperplasia)    Calculus of kidney 08/23/2013   Glaucoma    no drops in 3 mo pressure good, pt denies glaucoma, eye pressure has been measuring alright.   History of kidney stones    HLD (hyperlipidemia)    HOH (hard of hearing)    Left Hearing Aid   HTN (hypertension) 12/26/2014   Hypertension    Hyponatremia 12/26/2014   Migraines    history of migraines when he was younger.   Restless leg syndrome    Sinus drainage    Skin cancer    Skin cancer    left hand 06/2019   Urothelial cancer ([place])    chemo tx's.   UTI (lower urinary tract infection) 12/26/2014   Vertigo      Past Surgical History:  Procedure Laterality Date   COLONOSCOPY     CYSTOSCOPY W/ RETROGRADES Right 01/30/2015   Procedure: CYSTOSCOPY WITH RETROGRADE PYELOGRAM;  Surgeon: [name], MD;  Location: [facility];  Service: Urology;  Laterality: Right;    CYSTOSCOPY W/ RETROGRADES Bilateral 02/26/2016   Procedure: CYSTOSCOPY WITH RETROGRADE PYELOGRAM;  Surgeon: [name], MD;  Location: [facility];  Service: Urology;  Laterality: Bilateral;   CYSTOSCOPY W/ URETERAL STENT PLACEMENT Right 08/20/2015   Procedure: CYSTOSCOPY WITH RETROGRADE PYELOGRAM/POSSIBLE URETERAL STENT PLACEMENT/BLADDER BIOPSY;  Surgeon: [name], MD;  Location: [facility];  Service: Urology;  Laterality: Right;   CYSTOSCOPY W/ URETERAL STENT PLACEMENT Right 09/12/2015   Procedure: CYSTOSCOPY WITH STENT REPLACEMENT;  Surgeon: [name], MD;  Location: [facility];  Service: Urology;  Laterality: Right;   CYSTOSCOPY WITH BIOPSY Right 09/12/2015   Procedure: CYSTOSCOPY WITH BLADDER AND URETERAL BIOPSY;  Surgeon: [name], MD;  Location: [facility];  Service: Urology;  Laterality: Right;   CYSTOSCOPY WITH STENT PLACEMENT Right 01/30/2015   Procedure: CYSTOSCOPY WITH STENT PLACEMENT;  Surgeon: [name], MD;  Location: [facility];  Service: Urology;  Laterality: Right;   CYSTOSCOPY WITH STENT PLACEMENT Right 12/28/2017   Procedure: CYSTOSCOPY WITH STENT Exchange;  Surgeon: [name], MD;  Location: [facility];  Service: Urology;  Laterality: Right;   CYSTOSCOPY/URETEROSCOPY/HOLMIUM LASER/STENT PLACEMENT Right 08/19/2017   Procedure: CYSTOSCOPY/URETEROSCOPY/HOLMIUM LASER/STENT PLACEMENT;  Surgeon: [name], MD;  Location: [facility];  Service: Urology;  Laterality: Right;   EYE SURGERY Bilateral    Cataract Extraction with IOL   goiter removal  HOLMIUM LASER APPLICATION N/A 2/45/8099   Procedure:  HOLMIUM LASER APPLICATION;  Surgeon: [name], MD;  Location: [facility];  Service: Urology;  Laterality: N/A;   PORTA CATH INSERTION N/A 09/23/2017   Procedure: PORTA CATH INSERTION;  Surgeon: Algernon Huxley, MD;  Location: [facility];  Service: Cardiovascular;  Laterality: N/A;   SPERMATOCELECTOMY     TONSILLECTOMY     TRANSURETHRAL RESECTION OF BLADDER  TUMOR N/A 02/26/2016   Procedure: TRANSURETHRAL RESECTION OF BLADDER TUMOR (TURBT);  Surgeon: [name], MD;  Location: [facility];  Service: Urology;  Laterality: N/A;   TRANSURETHRAL RESECTION OF BLADDER TUMOR WITH MITOMYCIN-C N/A 09/12/2015   Procedure: TRANSURETHRAL RESECTION OF BLADDER TUMOR ;  Surgeon: [name], MD;  Location: [facility];  Service: Urology;  Laterality: N/A;   TRANSURETHRAL RESECTION OF BLADDER TUMOR WITH MITOMYCIN-C N/A 03/24/2016   Procedure: TRANSURETHRAL RESECTION OF BLADDER TUMOR WITH MITOMYCIN-C  (SMALL);  Surgeon: [name], MD;  Location: [facility];  Service: Urology;  Laterality: N/A;   URETERAL BIOPSY Right 08/19/2017   Procedure: Renal Mass BIOPSY;  Surgeon: [name], MD;  Location: [facility];  Service: Urology;  Laterality: Right;   URETEROSCOPY Right 01/30/2015   Procedure: URETEROSCOPY/ WITH BIOPSY AND CYTOLOGY BRUSHING;  Surgeon: [name], MD;  Location: [facility];  Service: Urology;  Laterality: Right;   URETEROSCOPY Right 08/20/2015   Procedure: URETEROSCOPY;  Surgeon: [name], MD;  Location: [facility];  Service: Urology;  Laterality: Right;   URETEROSCOPY Right 09/12/2015   Procedure: URETEROSCOPY;  Surgeon: [name], MD;  Location: [facility];  Service: Urology;  Laterality: Right;   URETEROSCOPY Right 02/26/2016   Procedure: URETEROSCOPY;  Surgeon: [name], MD;  Location: [facility];  Service: Urology;  Laterality: Right;    Social History   Socioeconomic History   Marital status: Married    Spouse name: Not on file   Number of children: Not on file   Years of education: Not on file   Highest education level: Not on file  Occupational History   Not on file  Tobacco Use   Smoking status: Never   Smokeless tobacco: Never  Vaping Use   Vaping Use: Never used  Substance and Sexual Activity   Alcohol use: No    Alcohol/week: 0.0 standard drinks   Drug use: No   Sexual activity: Not Currently  Other Topics  Concern   Not on file  Social History Narrative   Not on file   Social Determinants of Health   Financial Resource Strain: Not on file  Food Insecurity: Not on file  Transportation Needs: Not on file  Physical Activity: Not on file  Stress: Not on file  Social Connections: Not on file  Intimate Partner Violence: Not on file    Family History  Problem Relation Age of Onset   Hypertension Mother    Hypertension Father    Prostate cancer Brother    Kidney disease Neg Hx    Kidney cancer Neg Hx    Bladder Cancer Neg Hx      Current Outpatient Medications:    acetaminophen (TYLENOL) 500 MG tablet, Take by mouth., Disp: , Rfl:    amLODipine (NORVASC) 5 MG tablet, Take 5 mg by mouth daily. , Disp: , Rfl:    camphor-menthol (SARNA) lotion, Apply 1 application topically as needed for itching., Disp: , Rfl:    diazepam (VALIUM) 5 MG tablet, TAKE 1 TABLET (5 MG TOTAL) BY MOUTH EVERY 12 (TWELVE) HOURS  AS NEEDED FOR ANXIETY OR SLEEP, Disp: , Rfl:    docusate sodium (COLACE) 250 MG capsule, Take 250 mg by mouth daily., Disp: , Rfl:    fenofibrate micronized (LOFIBRA) 134 MG capsule, Take 134 mg by mouth daily before breakfast. , Disp: , Rfl:    folic acid (FOLVITE) 1 MG tablet, Take by mouth., Disp: , Rfl:    gemfibrozil (LOPID) 600 MG tablet, Take by mouth., Disp: , Rfl:    HYDROcodone-acetaminophen (NORCO/VICODIN) 5-325 MG tablet, Take 1 tablet by mouth every 6 (six) hours as needed for severe pain., Disp: 30 tablet, Rfl: 0   Multiple Vitamin (MULTIVITAMIN WITH MINERALS) TABS tablet, Take 1 tablet by mouth daily., Disp: , Rfl:    nystatin (MYCOSTATIN/NYSTOP) powder, Apply 1 application topically 3 (three) times daily. To under arms area that has rash, Disp: 30 g, Rfl: 0   polyethylene glycol (MIRALAX / GLYCOLAX) 17 g packet, Take 17 g by mouth daily., Disp: , Rfl:    prochlorperazine (COMPAZINE) 10 MG tablet, Take 1 tablet (10 mg total) by mouth every 6 (six) hours as needed for nausea or  vomiting., Disp: 30 tablet, Rfl: 0   psyllium (METAMUCIL) 58.6 % packet, Take 1 packet by mouth daily., Disp: , Rfl:    senna (SENOKOT) 8.6 MG tablet, Take 1 tablet by mouth daily., Disp: , Rfl:    tamsulosin (FLOMAX) 0.4 MG CAPS capsule, Take 1 capsule (0.4 mg total) by mouth daily., Disp: 90 capsule, Rfl: 3   triamcinolone cream (KENALOG) 0.1 %, Apply 1 application topically 2 (two) times daily., Disp: , Rfl:    urea (CARMOL) 10 % cream, Apply topically 3 (three) times daily. NOT THE HANDS UNTIL IT CLEARS UP, Disp: 71 g, Rfl: 0   benazepril-hydrochlorthiazide (LOTENSIN HCT) 20-12.5 MG tablet, Take 1 tablet by mouth daily.  (Patient not taking: Reported on 03/18/2021), Disp: , Rfl:    LAGEVRIO 200 MG CAPS capsule, Take 4 capsules by mouth 2 (two) times daily. (Patient not taking: Reported on 03/18/2021), Disp: , Rfl:  No current facility-administered medications for this visit.  Facility-Administered Medications Ordered in Other Visits:    heparin lock flush 100 UNIT/ML injection, , , ,    heparin lock flush 100 UNIT/ML injection, , , ,    sodium chloride flush (NS) 0.9 % injection 10 mL, 10 mL, Intravenous, Once, Sindy Guadeloupe, MD   sodium chloride flush (NS) 0.9 % injection 10 mL, 10 mL, Intravenous, PRN, Sindy Guadeloupe, MD  Physical exam:  Vitals:   04/29/21 1006  BP: 136/80  Pulse: 83  Resp: 17  Temp: (!) 97.1 F (36.2 C)  TempSrc: Tympanic  SpO2: 97%  Weight: 193 lb (87.5 kg)   Physical Exam Constitutional:      General: He is not in acute distress. Cardiovascular:     Rate and Rhythm: Normal rate and regular rhythm.     Heart sounds: Normal heart sounds.  Pulmonary:     Effort: Pulmonary effort is normal.     Breath sounds: Normal breath sounds.  Abdominal:     General: Bowel sounds are normal.     Palpations: Abdomen is soft.  Skin:    General: Skin is warm and dry.     Comments: Erythematous maculopapular rash noted over bilateral forearms.  No rash noted over trunk or  back  Neurological:     Mental Status: He is alert and oriented to person, place, and time.     CMP Latest Ref Rng &  Units 04/29/2021  Glucose 70 - 99 mg/dL 144(H)  BUN 8 - 23 mg/dL 16  Creatinine 0.61 - 1.24 mg/dL 1.01  Sodium 135 - 145 mmol/L 133(L)  Potassium 3.5 - 5.1 mmol/L 3.7  Chloride 98 - 111 mmol/L 102  CO2 22 - 32 mmol/L 24  Calcium 8.9 - 10.3 mg/dL 8.6(L)  Total Protein 6.5 - 8.1 g/dL 6.8  Total Bilirubin 0.3 - 1.2 mg/dL 0.3  Alkaline Phos 38 - 126 U/L 49  AST 15 - 41 U/L 34  ALT 0 - 44 U/L 23   CBC Latest Ref Rng & Units 04/29/2021  WBC 4.0 - 10.5 K/uL 6.4  Hemoglobin 13.0 - 17.0 g/dL 13.8  Hematocrit 39.0 - 52.0 % 40.4  Platelets 150 - 400 K/uL 295    No images are attached to the encounter.  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 On chemotherapy. Asymptomatic. EXAM: CT CHEST, ABDOMEN, AND PELVIS WITH CONTRAST TECHNIQUE: Multidetector CT imaging of the chest, abdomen and pelvis was performed following the standard protocol during bolus administration of intravenous contrast. RADIATION DOSE REDUCTION: This exam was performed according to the departmental dose-optimization program which includes automated exposure control, adjustment of the mA and/or kV according to patient size and/or use of iterative reconstruction technique. CONTRAST:  158mL OMNIPAQUE IOHEXOL 300 MG/ML  SOLN COMPARISON:  01/03/2021 FINDINGS: CT CHEST FINDINGS Cardiovascular: Right Port-A-Cath tip low SVC. Aortic atherosclerosis. Tortuous thoracic aorta. Normal heart size, without pericardial effusion. Lad coronary artery calcification. No central pulmonary embolism, on this non-dedicated study. Mediastinum/Nodes: No supraclavicular adenopathy. No mediastinal or hilar adenopathy. Lungs/Pleura: No pleural fluid. Inferior right upper lobe index pulmonary nodule measures 1.3 by 1.1 cm on 78/3  versus similar on the prior exam (when remeasured). Subpleural left lower lobe pulmonary nodule anteriorly measures 5 mm on 92/3 versus 10 mm on the prior exam. Right lower lobe subpleural pulmonary nodule has resolved. No new or enlarging nodules. Musculoskeletal: Moderate bilateral gynecomastia is not significantly changed. No acute osseous abnormality. CT ABDOMEN PELVIS FINDINGS Hepatobiliary: Normal liver. Multiple gallstones on the order 5 mm. No acute cholecystitis or biliary duct dilatation. Pancreas: Pancreatic atrophy is likely within normal variation for age. Cystic lesions within the pancreatic head including at up to 1.3 cm are not changed and of doubtful clinical significance given patient age and comorbidities. No duct dilatation or acute inflammation. Spleen: Normal in size, without focal abnormality. Adrenals/Urinary Tract: Normal adrenal glands. Lower pole right renal cyst of 3.7 cm. Left renal too small to characterize lesions. No hydronephrosis. Normal urinary bladder. Stomach/Bowel: Normal stomach, without wall thickening. Scattered colonic diverticula. Normal terminal ileum. Normal small bowel. Vascular/Lymphatic: Aortic atherosclerosis. No abdominal adenopathy. The right internal iliac nodal metastasis measures 2.1 x 1.5 cm on 114/2. Compare 3.2 x 3.1 cm on the prior. Reproductive: Normal prostate. Other: Small left-sided bladder diverticulum just posterior to the ureterovesicular junction. This is unchanged. No bladder mass. Musculoskeletal: Lumbosacral spondylosis. Transitional S1 vertebral body. IMPRESSION: CT CHEST IMPRESSION 1. Response to therapy of pulmonary metastasis. Although the dominant inferior right upper lobe pulmonary nodule is similar in size, other smaller nodules are decreased and resolved. 2. No thoracic adenopathy. 3. Chronic moderate gynecomastia. 4. Coronary artery atherosclerosis. Aortic Atherosclerosis (ICD10-I70.0). CT ABDOMEN AND PELVIS IMPRESSION 1. Response to  therapy of right internal iliac nodal metastasis. 2. No new or progressive disease. 3. Cholelithiasis. Electronically Signed   By: Marylyn Ishihara  Jobe Igo M.D.   On: 04/05/2021 13:29     Assessment and plan- Patient is a 88 y.o. male with metastatic urothelial carcinoma with metastases to lungs and pelvic adenopathy.  He is here for on treatment assessment prior to cycle 10-day 1 of Padcev  Counts okay to proceed with cycle 10-day 1 of Padcev today.  He will directly proceed with day 8 of treatment next week.  He will also proceed with cycle 11-day 1 of Padcev in 3 weeks and I will see him back in 4 weeks for cycle 10-day 8 of treatment  Drug-induced skin rash: Likely secondary to Padcev.  Rash is presently limited to his bilateral forearms.  Does not report any significant pruritus and has been using topical Vaseline for the same.  Continue to monitor   Visit Diagnosis 1. Encounter for antineoplastic chemotherapy   2. Drug-induced skin rash   3. 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 Randa Evens, MD, MPH [facility] [phone] 04/29/2021 1:00 PM

## 2021-05-02 DIAGNOSIS — E7800 Pure hypercholesterolemia, unspecified: Secondary | ICD-10-CM | POA: Diagnosis not present

## 2021-05-02 DIAGNOSIS — Z79899 Other long term (current) drug therapy: Secondary | ICD-10-CM | POA: Diagnosis not present

## 2021-05-02 DIAGNOSIS — I10 Essential (primary) hypertension: Secondary | ICD-10-CM | POA: Diagnosis not present

## 2021-05-02 DIAGNOSIS — R739 Hyperglycemia, unspecified: Secondary | ICD-10-CM | POA: Diagnosis not present

## 2021-05-06 ENCOUNTER — Other Ambulatory Visit: Payer: Self-pay

## 2021-05-06 ENCOUNTER — Inpatient Hospital Stay: Payer: PPO

## 2021-05-06 VITALS — BP 165/75 | HR 68 | Temp 97.0°F | Resp 19

## 2021-05-06 DIAGNOSIS — Z5112 Encounter for antineoplastic immunotherapy: Secondary | ICD-10-CM | POA: Diagnosis not present

## 2021-05-06 DIAGNOSIS — C669 Malignant neoplasm of unspecified ureter: Secondary | ICD-10-CM

## 2021-05-06 LAB — CBC WITH DIFFERENTIAL/PLATELET
Abs Immature Granulocytes: 0.01 10*3/uL (ref 0.00–0.07)
Basophils Absolute: 0 10*3/uL (ref 0.0–0.1)
Basophils Relative: 1 %
Eosinophils Absolute: 0 10*3/uL (ref 0.0–0.5)
Eosinophils Relative: 1 %
HCT: 39.4 % (ref 39.0–52.0)
Hemoglobin: 13.4 g/dL (ref 13.0–17.0)
Immature Granulocytes: 0 %
Lymphocytes Relative: 24 %
Lymphs Abs: 1.6 10*3/uL (ref 0.7–4.0)
MCH: 30.1 pg (ref 26.0–34.0)
MCHC: 34 g/dL (ref 30.0–36.0)
MCV: 88.5 fL (ref 80.0–100.0)
Monocytes Absolute: 0.8 10*3/uL (ref 0.1–1.0)
Monocytes Relative: 12 %
Neutro Abs: 4.2 10*3/uL (ref 1.7–7.7)
Neutrophils Relative %: 62 %
Platelets: 280 10*3/uL (ref 150–400)
RBC: 4.45 MIL/uL (ref 4.22–5.81)
RDW: 15.7 % — ABNORMAL HIGH (ref 11.5–15.5)
WBC: 6.7 10*3/uL (ref 4.0–10.5)
nRBC: 0 % (ref 0.0–0.2)

## 2021-05-06 LAB — COMPREHENSIVE METABOLIC PANEL
ALT: 25 U/L (ref 0–44)
AST: 36 U/L (ref 15–41)
Albumin: 3.6 g/dL (ref 3.5–5.0)
Alkaline Phosphatase: 53 U/L (ref 38–126)
Anion gap: 8 (ref 5–15)
BUN: 18 mg/dL (ref 8–23)
CO2: 25 mmol/L (ref 22–32)
Calcium: 8.6 mg/dL — ABNORMAL LOW (ref 8.9–10.3)
Chloride: 100 mmol/L (ref 98–111)
Creatinine, Ser: 0.9 mg/dL (ref 0.61–1.24)
GFR, Estimated: >60 mL/min (ref >60)
Glucose, Bld: 115 mg/dL — ABNORMAL HIGH (ref 70–99)
Potassium: 3.9 mmol/L (ref 3.5–5.1)
Sodium: 133 mmol/L — ABNORMAL LOW (ref 135–145)
Total Bilirubin: 0.4 mg/dL (ref 0.3–1.2)
Total Protein: 6.7 g/dL (ref 6.5–8.1)

## 2021-05-06 MED ORDER — HEPARIN SOD (PORK) LOCK FLUSH 100 UNIT/ML IV SOLN
INTRAVENOUS | Status: AC
  Filled 2021-05-06: qty 5

## 2021-05-06 MED ORDER — SODIUM CHLORIDE 0.9 % IV SOLN
10.0000 mg | Freq: Once | INTRAVENOUS | Status: AC
  Administered 2021-05-06: 10 mg via INTRAVENOUS
  Filled 2021-05-06: qty 10

## 2021-05-06 MED ORDER — HEPARIN SOD (PORK) LOCK FLUSH 100 UNIT/ML IV SOLN
500.0000 [IU] | Freq: Once | INTRAVENOUS | Status: DC | PRN
  Filled 2021-05-06: qty 5

## 2021-05-06 MED ORDER — SODIUM CHLORIDE 0.9 % IV SOLN
Freq: Once | INTRAVENOUS | Status: AC
  Filled 2021-05-06: qty 250

## 2021-05-06 MED ORDER — PALONOSETRON HCL INJECTION 0.25 MG/5ML
0.2500 mg | Freq: Once | INTRAVENOUS | Status: AC
  Administered 2021-05-06: 0.25 mg via INTRAVENOUS
  Filled 2021-05-06: qty 5

## 2021-05-06 MED ORDER — SODIUM CHLORIDE 0.9 % IV SOLN
90.0000 mg | Freq: Once | INTRAVENOUS | Status: AC
  Administered 2021-05-06: 90 mg via INTRAVENOUS
  Filled 2021-05-06: qty 9

## 2021-05-06 NOTE — Patient Instructions (Signed)
[facility] CANCER CTR AT [place]  Discharge Instructions: Thank you for choosing [facility] to provide your oncology and hematology care.  If you have a lab appointment with the [place], please go directly to the [place] and check in at the registration area.  Wear comfortable clothing and clothing appropriate for easy access to any Portacath or PICC line.   We strive to give you quality time with your provider. You may need to reschedule your appointment if you arrive late (15 or more minutes).  Arriving late affects you and other patients whose appointments are after yours.  Also, if you miss three or more appointments without notifying the office, you may be dismissed from the clinic at the providers discretion.      For prescription refill requests, have your pharmacy contact our office and allow 72 hours for refills to be completed.    Today you received the following chemotherapy and/or immunotherapy agents padcev   To help prevent nausea and vomiting after your treatment, we encourage you to take your nausea medication as directed.  BELOW ARE SYMPTOMS THAT SHOULD BE REPORTED IMMEDIATELY: *FEVER GREATER THAN 100.4 F (38 C) OR HIGHER *CHILLS OR SWEATING *NAUSEA AND VOMITING THAT IS NOT CONTROLLED WITH YOUR NAUSEA MEDICATION *UNUSUAL SHORTNESS OF BREATH *UNUSUAL BRUISING OR BLEEDING *URINARY PROBLEMS (pain or burning when urinating, or frequent urination) *BOWEL PROBLEMS (unusual diarrhea, constipation, pain near the anus) TENDERNESS IN MOUTH AND THROAT WITH OR WITHOUT PRESENCE OF ULCERS (sore throat, sores in mouth, or a toothache) UNUSUAL RASH, SWELLING OR PAIN  UNUSUAL VAGINAL DISCHARGE OR ITCHING   Items with * indicate a potential emergency and should be followed up as soon as possible or go to the Emergency Department if any problems should occur.  Please show the CHEMOTHERAPY ALERT CARD or IMMUNOTHERAPY ALERT CARD at check-in to the  Emergency Department and triage nurse.  Should you have questions after your visit or need to cancel or reschedule your appointment, please contact [facility] CANCER [place] AT [place]  [phone] and follow the prompts.  Office hours are 8:00 a.m. to 4:30 p.m. Monday - Friday. Please note that voicemails left after 4:00 p.m. may not be returned until the following business day.  We are closed weekends and major holidays. You have access to a nurse at all times for urgent questions. Please call the main number to the clinic [phone] and follow the prompts.  For any non-urgent questions, you may also contact your provider using MyChart. We now offer e-Visits for anyone 107 and older to request care online for non-urgent symptoms. For details visit [email or web address].   Also download the MyChart app! Go to the app store, search "MyChart", open the app, select [place], and log in with your MyChart username and password.  Due to Covid, a mask is required upon entering the hospital/clinic. If you do not have a mask, one will be given to you upon arrival. For doctor visits, patients may have 1 support person aged 26 or older with them. For treatment visits, patients cannot have anyone with them due to current Covid guidelines and our immunocompromised population.

## 2021-05-06 NOTE — Progress Notes (Signed)
Per pt the "left side of my groin has been red/irritated rash/and starting to peel. The rash started this past Saturday." Per pt he sees a dermatologist next week. RN made MD aware. VSS. Per MD to proceed with treatment today.   Tupac Jeffus [employer]

## 2021-05-09 DIAGNOSIS — Z0000 Encounter for general adult medical examination without abnormal findings: Secondary | ICD-10-CM | POA: Diagnosis not present

## 2021-05-09 DIAGNOSIS — N183 Chronic kidney disease, stage 3 unspecified: Secondary | ICD-10-CM | POA: Diagnosis not present

## 2021-05-09 DIAGNOSIS — R739 Hyperglycemia, unspecified: Secondary | ICD-10-CM | POA: Diagnosis not present

## 2021-05-09 DIAGNOSIS — I10 Essential (primary) hypertension: Secondary | ICD-10-CM | POA: Diagnosis not present

## 2021-05-09 DIAGNOSIS — C649 Malignant neoplasm of unspecified kidney, except renal pelvis: Secondary | ICD-10-CM | POA: Diagnosis not present

## 2021-05-09 DIAGNOSIS — T466X5A Adverse effect of antihyperlipidemic and antiarteriosclerotic drugs, initial encounter: Secondary | ICD-10-CM | POA: Diagnosis not present

## 2021-05-09 DIAGNOSIS — G720 Drug-induced myopathy: Secondary | ICD-10-CM | POA: Diagnosis not present

## 2021-05-09 DIAGNOSIS — E782 Mixed hyperlipidemia: Secondary | ICD-10-CM | POA: Diagnosis not present

## 2021-05-13 DIAGNOSIS — D2262 Melanocytic nevi of left upper limb, including shoulder: Secondary | ICD-10-CM | POA: Diagnosis not present

## 2021-05-13 DIAGNOSIS — L821 Other seborrheic keratosis: Secondary | ICD-10-CM | POA: Diagnosis not present

## 2021-05-13 DIAGNOSIS — C44529 Squamous cell carcinoma of skin of other part of trunk: Secondary | ICD-10-CM | POA: Diagnosis not present

## 2021-05-13 DIAGNOSIS — D2272 Melanocytic nevi of left lower limb, including hip: Secondary | ICD-10-CM | POA: Diagnosis not present

## 2021-05-13 DIAGNOSIS — L570 Actinic keratosis: Secondary | ICD-10-CM | POA: Diagnosis not present

## 2021-05-13 DIAGNOSIS — D485 Neoplasm of uncertain behavior of skin: Secondary | ICD-10-CM | POA: Diagnosis not present

## 2021-05-13 DIAGNOSIS — Z85828 Personal history of other malignant neoplasm of skin: Secondary | ICD-10-CM | POA: Diagnosis not present

## 2021-05-13 DIAGNOSIS — L270 Generalized skin eruption due to drugs and medicaments taken internally: Secondary | ICD-10-CM | POA: Diagnosis not present

## 2021-05-20 ENCOUNTER — Inpatient Hospital Stay: Payer: PPO

## 2021-05-20 ENCOUNTER — Other Ambulatory Visit: Payer: Self-pay

## 2021-05-20 ENCOUNTER — Inpatient Hospital Stay: Payer: PPO | Attending: Oncology

## 2021-05-20 VITALS — BP 162/79 | HR 61 | Temp 97.8°F | Resp 16

## 2021-05-20 DIAGNOSIS — Z8042 Family history of malignant neoplasm of prostate: Secondary | ICD-10-CM | POA: Insufficient documentation

## 2021-05-20 DIAGNOSIS — Z87440 Personal history of urinary (tract) infections: Secondary | ICD-10-CM | POA: Insufficient documentation

## 2021-05-20 DIAGNOSIS — C7802 Secondary malignant neoplasm of left lung: Secondary | ICD-10-CM | POA: Diagnosis not present

## 2021-05-20 DIAGNOSIS — N400 Enlarged prostate without lower urinary tract symptoms: Secondary | ICD-10-CM | POA: Insufficient documentation

## 2021-05-20 DIAGNOSIS — C669 Malignant neoplasm of unspecified ureter: Secondary | ICD-10-CM

## 2021-05-20 DIAGNOSIS — Z885 Allergy status to narcotic agent status: Secondary | ICD-10-CM | POA: Diagnosis not present

## 2021-05-20 DIAGNOSIS — Z87442 Personal history of urinary calculi: Secondary | ICD-10-CM | POA: Insufficient documentation

## 2021-05-20 DIAGNOSIS — Z8249 Family history of ischemic heart disease and other diseases of the circulatory system: Secondary | ICD-10-CM | POA: Diagnosis not present

## 2021-05-20 DIAGNOSIS — Z882 Allergy status to sulfonamides status: Secondary | ICD-10-CM | POA: Diagnosis not present

## 2021-05-20 DIAGNOSIS — C661 Malignant neoplasm of right ureter: Secondary | ICD-10-CM | POA: Insufficient documentation

## 2021-05-20 DIAGNOSIS — Z79899 Other long term (current) drug therapy: Secondary | ICD-10-CM | POA: Diagnosis not present

## 2021-05-20 DIAGNOSIS — L309 Dermatitis, unspecified: Secondary | ICD-10-CM | POA: Insufficient documentation

## 2021-05-20 DIAGNOSIS — Z5112 Encounter for antineoplastic immunotherapy: Secondary | ICD-10-CM | POA: Insufficient documentation

## 2021-05-20 DIAGNOSIS — Z85828 Personal history of other malignant neoplasm of skin: Secondary | ICD-10-CM | POA: Insufficient documentation

## 2021-05-20 DIAGNOSIS — I2510 Atherosclerotic heart disease of native coronary artery without angina pectoris: Secondary | ICD-10-CM | POA: Diagnosis not present

## 2021-05-20 DIAGNOSIS — I700 Atherosclerosis of aorta: Secondary | ICD-10-CM | POA: Diagnosis not present

## 2021-05-20 DIAGNOSIS — K8020 Calculus of gallbladder without cholecystitis without obstruction: Secondary | ICD-10-CM | POA: Diagnosis not present

## 2021-05-20 DIAGNOSIS — C7951 Secondary malignant neoplasm of bone: Secondary | ICD-10-CM | POA: Insufficient documentation

## 2021-05-20 LAB — CBC WITH DIFFERENTIAL/PLATELET
Abs Immature Granulocytes: 0.05 10*3/uL (ref 0.00–0.07)
Basophils Absolute: 0.1 10*3/uL (ref 0.0–0.1)
Basophils Relative: 1 %
Eosinophils Absolute: 0.1 10*3/uL (ref 0.0–0.5)
Eosinophils Relative: 1 %
HCT: 39.8 % (ref 39.0–52.0)
Hemoglobin: 13.8 g/dL (ref 13.0–17.0)
Immature Granulocytes: 1 %
Lymphocytes Relative: 25 %
Lymphs Abs: 2 10*3/uL (ref 0.7–4.0)
MCH: 30.7 pg (ref 26.0–34.0)
MCHC: 34.7 g/dL (ref 30.0–36.0)
MCV: 88.6 fL (ref 80.0–100.0)
Monocytes Absolute: 0.8 10*3/uL (ref 0.1–1.0)
Monocytes Relative: 10 %
Neutro Abs: 5 10*3/uL (ref 1.7–7.7)
Neutrophils Relative %: 62 %
Platelets: 264 10*3/uL (ref 150–400)
RBC: 4.49 MIL/uL (ref 4.22–5.81)
RDW: 15.9 % — ABNORMAL HIGH (ref 11.5–15.5)
WBC: 7.9 10*3/uL (ref 4.0–10.5)
nRBC: 0 % (ref 0.0–0.2)

## 2021-05-20 LAB — COMPREHENSIVE METABOLIC PANEL
ALT: 27 U/L (ref 0–44)
AST: 33 U/L (ref 15–41)
Albumin: 3.5 g/dL (ref 3.5–5.0)
Alkaline Phosphatase: 58 U/L (ref 38–126)
Anion gap: 5 (ref 5–15)
BUN: 17 mg/dL (ref 8–23)
CO2: 26 mmol/L (ref 22–32)
Calcium: 8.5 mg/dL — ABNORMAL LOW (ref 8.9–10.3)
Chloride: 101 mmol/L (ref 98–111)
Creatinine, Ser: 0.85 mg/dL (ref 0.61–1.24)
GFR, Estimated: >60 mL/min (ref >60)
Glucose, Bld: 115 mg/dL — ABNORMAL HIGH (ref 70–99)
Potassium: 3.7 mmol/L (ref 3.5–5.1)
Sodium: 132 mmol/L — ABNORMAL LOW (ref 135–145)
Total Bilirubin: 0.9 mg/dL (ref 0.3–1.2)
Total Protein: 6.7 g/dL (ref 6.5–8.1)

## 2021-05-20 MED ORDER — SODIUM CHLORIDE 0.9 % IV SOLN
Freq: Once | INTRAVENOUS | Status: AC
  Filled 2021-05-20: qty 250

## 2021-05-20 MED ORDER — SODIUM CHLORIDE 0.9% FLUSH
10.0000 mL | INTRAVENOUS | Status: DC | PRN
  Administered 2021-05-20: 10 mL
  Filled 2021-05-20: qty 10

## 2021-05-20 MED ORDER — HEPARIN SOD (PORK) LOCK FLUSH 100 UNIT/ML IV SOLN
500.0000 [IU] | Freq: Once | INTRAVENOUS | Status: AC | PRN
  Administered 2021-05-20: 500 [IU]
  Filled 2021-05-20: qty 5

## 2021-05-20 MED ORDER — SODIUM CHLORIDE 0.9 % IV SOLN
90.0000 mg | Freq: Once | INTRAVENOUS | Status: AC
  Administered 2021-05-20: 90 mg via INTRAVENOUS
  Filled 2021-05-20: qty 9

## 2021-05-20 MED ORDER — PALONOSETRON HCL INJECTION 0.25 MG/5ML
0.2500 mg | Freq: Once | INTRAVENOUS | Status: AC
  Administered 2021-05-20: 0.25 mg via INTRAVENOUS
  Filled 2021-05-20: qty 5

## 2021-05-20 MED ORDER — SODIUM CHLORIDE 0.9 % IV SOLN
10.0000 mg | Freq: Once | INTRAVENOUS | Status: AC
  Administered 2021-05-20: 10 mg via INTRAVENOUS
  Filled 2021-05-20: qty 10

## 2021-05-20 NOTE — Patient Instructions (Signed)
[facility] CANCER CTR AT [place]  Discharge Instructions: ?Thank you for choosing [place] to provide your oncology and hematology care.  ?If you have a lab appointment with the [place], please go directly to the [place] and check in at the registration area. ? ?Wear comfortable clothing and clothing appropriate for easy access to any Portacath or PICC line.  ? ?We strive to give you quality time with your provider. You may need to reschedule your appointment if you arrive late (15 or more minutes).  Arriving late affects you and other patients whose appointments are after yours.  Also, if you miss three or more appointments without notifying the office, you may be dismissed from the clinic at the provider?s discretion.    ?  ?For prescription refill requests, have your pharmacy contact our office and allow 72 hours for refills to be completed.   ? ?Today you received the following chemotherapy and/or immunotherapy agents: Padcev     ?  ?To help prevent nausea and vomiting after your treatment, we encourage you to take your nausea medication as directed. ? ?BELOW ARE SYMPTOMS THAT SHOULD BE REPORTED IMMEDIATELY: ?*FEVER GREATER THAN 100.4 F (38 ?C) OR HIGHER ?*CHILLS OR SWEATING ?*NAUSEA AND VOMITING THAT IS NOT CONTROLLED WITH YOUR NAUSEA MEDICATION ?*UNUSUAL SHORTNESS OF BREATH ?*UNUSUAL BRUISING OR BLEEDING ?*URINARY PROBLEMS (pain or burning when urinating, or frequent urination) ?*BOWEL PROBLEMS (unusual diarrhea, constipation, pain near the anus) ?TENDERNESS IN MOUTH AND THROAT WITH OR WITHOUT PRESENCE OF ULCERS (sore throat, sores in mouth, or a toothache) ?UNUSUAL RASH, SWELLING OR PAIN  ?UNUSUAL VAGINAL DISCHARGE OR ITCHING  ? ?Items with * indicate a potential emergency and should be followed up as soon as possible or go to the Emergency Department if any problems should occur. ? ?Please show the CHEMOTHERAPY ALERT CARD or IMMUNOTHERAPY ALERT CARD at check-in to  the Emergency Department and triage nurse. ? ?Should you have questions after your visit or need to cancel or reschedule your appointment, please contact [facility] CANCER [place] AT [place]  [phone] and follow the prompts.  Office hours are 8:00 a.m. to 4:30 p.m. Monday - Friday. Please note that voicemails left after 4:00 p.m. may not be returned until the following business day.  We are closed weekends and major holidays. You have access to a nurse at all times for urgent questions. Please call the main number to the clinic [phone] and follow the prompts. ? ?For any non-urgent questions, you may also contact your provider using MyChart. We now offer e-Visits for anyone 70 and older to request care online for non-urgent symptoms. For details visit [email or web address]. ?  ?Also download the MyChart app! Go to the app store, search "MyChart", open the app, select , and log in with your MyChart username and password. ? ?Due to Covid, a mask is required upon entering the hospital/clinic. If you do not have a mask, one will be given to you upon arrival. For doctor visits, patients may have 1 support person aged 14 or older with them. For treatment visits, patients cannot have anyone with them due to current Covid guidelines and our immunocompromised population.  ?

## 2021-05-27 ENCOUNTER — Other Ambulatory Visit: Payer: Self-pay

## 2021-05-27 ENCOUNTER — Inpatient Hospital Stay (HOSPITAL_BASED_OUTPATIENT_CLINIC_OR_DEPARTMENT_OTHER): Payer: PPO | Admitting: Oncology

## 2021-05-27 ENCOUNTER — Inpatient Hospital Stay: Payer: PPO

## 2021-05-27 ENCOUNTER — Encounter: Payer: Self-pay | Admitting: Oncology

## 2021-05-27 VITALS — BP 142/80 | HR 75 | Temp 98.6°F | Resp 18 | Wt 192.9 lb

## 2021-05-27 DIAGNOSIS — C669 Malignant neoplasm of unspecified ureter: Secondary | ICD-10-CM | POA: Diagnosis not present

## 2021-05-27 DIAGNOSIS — L270 Generalized skin eruption due to drugs and medicaments taken internally: Secondary | ICD-10-CM | POA: Diagnosis not present

## 2021-05-27 DIAGNOSIS — Z5111 Encounter for antineoplastic chemotherapy: Secondary | ICD-10-CM

## 2021-05-27 DIAGNOSIS — Z5112 Encounter for antineoplastic immunotherapy: Secondary | ICD-10-CM | POA: Diagnosis not present

## 2021-05-27 LAB — CBC WITH DIFFERENTIAL/PLATELET
Abs Immature Granulocytes: 0.02 10*3/uL (ref 0.00–0.07)
Basophils Absolute: 0.1 10*3/uL (ref 0.0–0.1)
Basophils Relative: 1 %
Eosinophils Absolute: 0.1 10*3/uL (ref 0.0–0.5)
Eosinophils Relative: 1 %
HCT: 42.2 % (ref 39.0–52.0)
Hemoglobin: 14.2 g/dL (ref 13.0–17.0)
Immature Granulocytes: 0 %
Lymphocytes Relative: 25 %
Lymphs Abs: 1.9 10*3/uL (ref 0.7–4.0)
MCH: 30 pg (ref 26.0–34.0)
MCHC: 33.6 g/dL (ref 30.0–36.0)
MCV: 89 fL (ref 80.0–100.0)
Monocytes Absolute: 0.8 10*3/uL (ref 0.1–1.0)
Monocytes Relative: 11 %
Neutro Abs: 4.7 10*3/uL (ref 1.7–7.7)
Neutrophils Relative %: 62 %
Platelets: 267 10*3/uL (ref 150–400)
RBC: 4.74 MIL/uL (ref 4.22–5.81)
RDW: 15.6 % — ABNORMAL HIGH (ref 11.5–15.5)
WBC: 7.5 10*3/uL (ref 4.0–10.5)
nRBC: 0 % (ref 0.0–0.2)

## 2021-05-27 LAB — COMPREHENSIVE METABOLIC PANEL
ALT: 24 U/L (ref 0–44)
AST: 35 U/L (ref 15–41)
Albumin: 3.5 g/dL (ref 3.5–5.0)
Alkaline Phosphatase: 52 U/L (ref 38–126)
Anion gap: 7 (ref 5–15)
BUN: 19 mg/dL (ref 8–23)
CO2: 27 mmol/L (ref 22–32)
Calcium: 8.6 mg/dL — ABNORMAL LOW (ref 8.9–10.3)
Chloride: 100 mmol/L (ref 98–111)
Creatinine, Ser: 0.88 mg/dL (ref 0.61–1.24)
GFR, Estimated: >60 mL/min (ref >60)
Glucose, Bld: 133 mg/dL — ABNORMAL HIGH (ref 70–99)
Potassium: 3.6 mmol/L (ref 3.5–5.1)
Sodium: 134 mmol/L — ABNORMAL LOW (ref 135–145)
Total Bilirubin: 0.5 mg/dL (ref 0.3–1.2)
Total Protein: 6.5 g/dL (ref 6.5–8.1)

## 2021-05-27 MED ORDER — HEPARIN SOD (PORK) LOCK FLUSH 100 UNIT/ML IV SOLN
500.0000 [IU] | Freq: Once | INTRAVENOUS | Status: AC
  Filled 2021-05-27: qty 5

## 2021-05-27 MED ORDER — HEPARIN SOD (PORK) LOCK FLUSH 100 UNIT/ML IV SOLN
INTRAVENOUS | Status: AC
  Administered 2021-05-27: 500 [IU]
  Filled 2021-05-27: qty 5

## 2021-05-27 MED ORDER — PALONOSETRON HCL INJECTION 0.25 MG/5ML
0.2500 mg | Freq: Once | INTRAVENOUS | Status: AC
  Administered 2021-05-27: 0.25 mg via INTRAVENOUS
  Filled 2021-05-27: qty 5

## 2021-05-27 MED ORDER — SODIUM CHLORIDE 0.9 % IV SOLN
Freq: Once | INTRAVENOUS | Status: AC
  Filled 2021-05-27: qty 250

## 2021-05-27 MED ORDER — SODIUM CHLORIDE 0.9 % IV SOLN
90.0000 mg | Freq: Once | INTRAVENOUS | Status: AC
  Administered 2021-05-27: 90 mg via INTRAVENOUS
  Filled 2021-05-27: qty 9

## 2021-05-27 MED ORDER — HEPARIN SOD (PORK) LOCK FLUSH 100 UNIT/ML IV SOLN
500.0000 [IU] | Freq: Once | INTRAVENOUS | Status: AC | PRN
  Filled 2021-05-27: qty 5

## 2021-05-27 MED ORDER — SODIUM CHLORIDE 0.9 % IV SOLN
10.0000 mg | Freq: Once | INTRAVENOUS | Status: AC
  Administered 2021-05-27: 10 mg via INTRAVENOUS
  Filled 2021-05-27: qty 10

## 2021-05-27 MED ORDER — SODIUM CHLORIDE 0.9% FLUSH
10.0000 mL | Freq: Once | INTRAVENOUS | Status: AC
  Administered 2021-05-27: 10 mL via INTRAVENOUS
  Filled 2021-05-27: qty 10

## 2021-05-27 NOTE — Progress Notes (Signed)
Patient reports that food does not taste good but still makes himself eat.  Eyes have been watering with redness in right eye.  Was advised by eye doctor that he has dry eyes were dry and use OTC eye drops.  The drops have helped with the watery but not the redness.   ? ?Was evaluated by dermatologist who did not offer relief from the rash on arms and is scheduled to have a skin cancer removed from his back. ? ?Constipation is relieved by medication.   ?

## 2021-05-27 NOTE — Progress Notes (Signed)
? ? ? ?Hematology/Oncology Consult note ?[place]  ?[phone] [phone] ? ?Patient Care Team: ?Idelle Crouch, MD as PCP - General (Internal Medicine) ?Sindy Guadeloupe, MD as Consulting Physician (Hematology and Oncology) ?Sindy Guadeloupe, MD as Consulting Physician (Hematology and Oncology) ?Sindy Guadeloupe, MD as Consulting Physician (Hematology and Oncology)  ? ?Name of the patient: Anthony Gonzales  ?[id number]  ?09/27/1931  ? ?Date of visit: 05/27/21 ? ?Diagnosis- Metastatic urothelial carcinoma with possible mets to the obturator node. Indeterminate hilar lymph nodes and LUL lung nodule currently in remission ? ?Chief complaint/ Reason for visit-on treatment assessment prior to cycle 11-day 8 of Padcev ? ?Heme/Onc history: patient is a 88 year old male with past medical history significant for hypertension and long-standing history of superficial bladder cancer for which he sees [name].  He has undergone TURBT as well as ureteroscopy since 2017 along with mitomycin as well in the past.  Most recently he underwent CT abdomen on 08/06/2017 which showed a soft tissue mass in the right renal pelvis concerning for upper tract urothelial neoplasm.  Soft tissue fullness at the right ureterovesical junction with proximal right hydroureteronephrosis.  Several millimeter attenuation lesion in the pancreatic head. ?  ?He underwent diagnostic ureteroscopy and was found to have 2 high-grade lesions within his right kidney.  There was a nodular high-grade appearing lesion in the right anterior renal pelvis.  There was also a second ureteral tumor fungating from the right ureteral orifice extending into the distal ureter also consistent with high-grade invasive urothelial carcinoma.  Muscle invasion could not be assessed.  He also underwent a CT chest which showed a rounded nodule in the right upper lobe measuring 14 mm concerning for metastases.  2 other 3 mm lesions were  also noted in the left upper lobe likely benign ?  ?Plan initially was neoadjuvant chemotherapy followed by possible surgery but given the presence of lung lesion patient has been referred to oncology for the same. ?  ?PET/CT on 09/21/17 showed: IMPRESSION: ?1. Hypermetabolic right obturator lymph node is most indicative of ?metastatic disease. ?2. Mildly hypermetabolic left hilar lymph nodes are nonspecific. ?Continued attention on follow-up exams is warranted. ?3. Right upper lobe pulmonary nodule shows metabolism after just ?above blood pool and is therefore indeterminate. Continued attention ?on follow-up exams is warranted. ?4. Aortic atherosclerosis (ICD10-170.0). Coronary artery ?calcification. ?5. Cholelithiasis ?  ? Carboplatin/ gemzar 1 week on and one-week off as patient could not tolerate 2-week on and one week off regimen.  Cycle 1 started on 09/22/2017 ?Disease progression in March 2020.  Switched to second line Keytruda ?  ?Patient had disease controlled with Keytruda.  However he was noted to have worsening dermatitis with constant flareups requiring steroids.  Beryle Flock is therefore being kept on hold and patient will be switched to third line Padcev.    Treatment has been on hold since February 2021 due to constant flareup of skin rash ?  ?Scans in October 2022 showed disease progression with new lung nodules as well as pelvic sidewall adenopathy.  Padcev restarted in November 2022 ? ?Interval history-he has stable rash on his bilateral forearms for which she uses topical emollients.  States that the rash does not bother him as much and is nonpruritic.  He also saw Dr. Evorn Gong recently from dermatology who recommends to continue present management.  Patient was also seen by ophthalmology for his symptoms of dry eyes and was given artificial tears ? ?ECOG PS- 1 ?Pain  scale- 0 ?Opioid associated constipation- no ? ?Review of systems- Review of Systems  ?Constitutional:  Negative for chills, fever,  malaise/fatigue and weight loss.  ?HENT:  Negative for congestion, ear discharge and nosebleeds.   ?Eyes:  Negative for blurred vision.  ?Respiratory:  Negative for cough, hemoptysis, sputum production, shortness of breath and wheezing.   ?Cardiovascular:  Negative for chest pain, palpitations, orthopnea and claudication.  ?Gastrointestinal:  Negative for abdominal pain, blood in stool, constipation, diarrhea, heartburn, melena, nausea and vomiting.  ?Genitourinary:  Negative for dysuria, flank pain, frequency, hematuria and urgency.  ?Musculoskeletal:  Negative for back pain, joint pain and myalgias.  ?Skin:  Positive for rash.  ?Neurological:  Negative for dizziness, tingling, focal weakness, seizures, weakness and headaches.  ?Endo/Heme/Allergies:  Does not bruise/bleed easily.  ?Psychiatric/Behavioral:  Negative for depression and suicidal ideas. The patient does not have insomnia.    ? ? ?Allergies  ?Allergen Reactions  ? Demerol [Meperidine] Nausea And Vomiting  ? Lipitor [Atorvastatin] Swelling  ? Sulfa Antibiotics Nausea And Vomiting and Rash  ? ? ? ?Past Medical History:  ?Diagnosis Date  ? Arthritis   ? Benign fibroma of prostate 08/23/2013  ? BPH (benign prostatic hyperplasia)   ? Calculus of kidney 08/23/2013  ? Glaucoma   ? no drops in 3 mo pressure good, pt denies glaucoma, eye pressure has been measuring alright.  ? History of kidney stones   ? HLD (hyperlipidemia)   ? HOH (hard of hearing)   ? Left Hearing Aid  ? HTN (hypertension) 12/26/2014  ? Hypertension   ? Hyponatremia 12/26/2014  ? Migraines   ? history of migraines when he was younger.  ? Restless leg syndrome   ? Sinus drainage   ? Skin cancer   ? Skin cancer   ? left hand 06/2019  ? Urothelial cancer ([place])   ? chemo tx's.  ? UTI (lower urinary tract infection) 12/26/2014  ? Vertigo   ? ? ? ?Past Surgical History:  ?Procedure Laterality Date  ? COLONOSCOPY    ? CYSTOSCOPY W/ RETROGRADES Right 01/30/2015  ? Procedure: CYSTOSCOPY WITH RETROGRADE  PYELOGRAM;  Surgeon: [name], MD;  Location: [facility] ORS;  Service: Urology;  Laterality: Right;  ? CYSTOSCOPY W/ RETROGRADES Bilateral 02/26/2016  ? Procedure: CYSTOSCOPY WITH RETROGRADE PYELOGRAM;  Surgeon: [name], MD;  Location: [facility] ORS;  Service: Urology;  Laterality: Bilateral;  ? CYSTOSCOPY W/ URETERAL STENT PLACEMENT Right 08/20/2015  ? Procedure: CYSTOSCOPY WITH RETROGRADE PYELOGRAM/POSSIBLE URETERAL STENT PLACEMENT/BLADDER BIOPSY;  Surgeon: [name], MD;  Location: [facility] ORS;  Service: Urology;  Laterality: Right;  ? CYSTOSCOPY W/ URETERAL STENT PLACEMENT Right 09/12/2015  ? Procedure: CYSTOSCOPY WITH STENT REPLACEMENT;  Surgeon: [name], MD;  Location: [facility] ORS;  Service: Urology;  Laterality: Right;  ? CYSTOSCOPY WITH BIOPSY Right 09/12/2015  ? Procedure: CYSTOSCOPY WITH BLADDER AND URETERAL BIOPSY;  Surgeon: [name], MD;  Location: [facility] ORS;  Service: Urology;  Laterality: Right;  ? CYSTOSCOPY WITH STENT PLACEMENT Right 01/30/2015  ? Procedure: CYSTOSCOPY WITH STENT PLACEMENT;  Surgeon: [name], MD;  Location: [facility] ORS;  Service: Urology;  Laterality: Right;  ? CYSTOSCOPY WITH STENT PLACEMENT Right 12/28/2017  ? Procedure: CYSTOSCOPY WITH STENT Exchange;  Surgeon: [name], MD;  Location: [facility] ORS;  Service: Urology;  Laterality: Right;  ? CYSTOSCOPY/URETEROSCOPY/HOLMIUM LASER/STENT PLACEMENT Right 08/19/2017  ? Procedure: CYSTOSCOPY/URETEROSCOPY/HOLMIUM LASER/STENT PLACEMENT;  Surgeon: [name], MD;  Location: [facility] ORS;  Service: Urology;  Laterality: Right;  ? EYE SURGERY Bilateral   ?  Cataract Extraction with IOL  ? goiter removal    ? HOLMIUM LASER APPLICATION N/A 8/98/4210  ? Procedure:  HOLMIUM LASER APPLICATION;  Surgeon: [name], MD;  Location: [facility] ORS;  Service: Urology;  Laterality: N/A;  ? PORTA CATH INSERTION N/A 09/23/2017  ? Procedure: PORTA CATH INSERTION;  Surgeon: Algernon Huxley, MD;  Location: [place] CV LAB;  Service:  Cardiovascular;  Laterality: N/A;  ? SPERMATOCELECTOMY    ? TONSILLECTOMY    ? TRANSURETHRAL RESECTION OF BLADDER TUMOR N/A 02/26/2016  ? Procedure: TRANSURETHRAL RESECTION OF BLADDER TUMOR (TURBT);  Surgeon: Caryl Pina

## 2021-05-27 NOTE — Patient Instructions (Signed)
[facility] CANCER CTR AT [place]  Discharge Instructions: ?Thank you for choosing [place] to provide your oncology and hematology care.  ?If you have a lab appointment with the [place], please go directly to the [place] and check in at the registration area. ? ?Wear comfortable clothing and clothing appropriate for easy access to any Portacath or PICC line.  ? ?We strive to give you quality time with your provider. You may need to reschedule your appointment if you arrive late (15 or more minutes).  Arriving late affects you and other patients whose appointments are after yours.  Also, if you miss three or more appointments without notifying the office, you may be dismissed from the clinic at the provider?s discretion.    ?  ?For prescription refill requests, have your pharmacy contact our office and allow 72 hours for refills to be completed.   ? ?Today you received the following chemotherapy and/or immunotherapy agents padcev ?  ?To help prevent nausea and vomiting after your treatment, we encourage you to take your nausea medication as directed. ? ?BELOW ARE SYMPTOMS THAT SHOULD BE REPORTED IMMEDIATELY: ?*FEVER GREATER THAN 100.4 F (38 ?C) OR HIGHER ?*CHILLS OR SWEATING ?*NAUSEA AND VOMITING THAT IS NOT CONTROLLED WITH YOUR NAUSEA MEDICATION ?*UNUSUAL SHORTNESS OF BREATH ?*UNUSUAL BRUISING OR BLEEDING ?*URINARY PROBLEMS (pain or burning when urinating, or frequent urination) ?*BOWEL PROBLEMS (unusual diarrhea, constipation, pain near the anus) ?TENDERNESS IN MOUTH AND THROAT WITH OR WITHOUT PRESENCE OF ULCERS (sore throat, sores in mouth, or a toothache) ?UNUSUAL RASH, SWELLING OR PAIN  ?UNUSUAL VAGINAL DISCHARGE OR ITCHING  ? ?Items with * indicate a potential emergency and should be followed up as soon as possible or go to the Emergency Department if any problems should occur. ? ?Please show the CHEMOTHERAPY ALERT CARD or IMMUNOTHERAPY ALERT CARD at check-in to the  Emergency Department and triage nurse. ? ?Should you have questions after your visit or need to cancel or reschedule your appointment, please contact [facility] CANCER [place] AT [place]  [phone] and follow the prompts.  Office hours are 8:00 a.m. to 4:30 p.m. Monday - Friday. Please note that voicemails left after 4:00 p.m. may not be returned until the following business day.  We are closed weekends and major holidays. You have access to a nurse at all times for urgent questions. Please call the main number to the clinic [phone] and follow the prompts. ? ?For any non-urgent questions, you may also contact your provider using MyChart. We now offer e-Visits for anyone 57 and older to request care online for non-urgent symptoms. For details visit [email or web address]. ?  ?Also download the MyChart app! Go to the app store, search "MyChart", open the app, select [place], and log in with your MyChart username and password. ? ?Due to Covid, a mask is required upon entering the hospital/clinic. If you do not have a mask, one will be given to you upon arrival. For doctor visits, patients may have 1 support person aged 43 or older with them. For treatment visits, patients cannot have anyone with them due to current Covid guidelines and our immunocompromised population.  ?

## 2021-05-29 DIAGNOSIS — C44529 Squamous cell carcinoma of skin of other part of trunk: Secondary | ICD-10-CM | POA: Diagnosis not present

## 2021-06-10 ENCOUNTER — Inpatient Hospital Stay: Payer: PPO

## 2021-06-10 ENCOUNTER — Inpatient Hospital Stay: Payer: PPO | Attending: Oncology

## 2021-06-10 ENCOUNTER — Ambulatory Visit: Payer: PPO

## 2021-06-10 ENCOUNTER — Encounter: Payer: Self-pay | Admitting: Oncology

## 2021-06-10 ENCOUNTER — Inpatient Hospital Stay (HOSPITAL_BASED_OUTPATIENT_CLINIC_OR_DEPARTMENT_OTHER): Payer: PPO | Admitting: Oncology

## 2021-06-10 ENCOUNTER — Other Ambulatory Visit: Payer: PPO

## 2021-06-10 VITALS — BP 155/88 | HR 88 | Resp 18 | Wt 188.5 lb

## 2021-06-10 DIAGNOSIS — R911 Solitary pulmonary nodule: Secondary | ICD-10-CM | POA: Insufficient documentation

## 2021-06-10 DIAGNOSIS — L270 Generalized skin eruption due to drugs and medicaments taken internally: Secondary | ICD-10-CM

## 2021-06-10 DIAGNOSIS — Z5112 Encounter for antineoplastic immunotherapy: Secondary | ICD-10-CM | POA: Diagnosis not present

## 2021-06-10 DIAGNOSIS — Z885 Allergy status to narcotic agent status: Secondary | ICD-10-CM | POA: Insufficient documentation

## 2021-06-10 DIAGNOSIS — Z882 Allergy status to sulfonamides status: Secondary | ICD-10-CM | POA: Diagnosis not present

## 2021-06-10 DIAGNOSIS — R5383 Other fatigue: Secondary | ICD-10-CM | POA: Diagnosis not present

## 2021-06-10 DIAGNOSIS — I2510 Atherosclerotic heart disease of native coronary artery without angina pectoris: Secondary | ICD-10-CM | POA: Diagnosis not present

## 2021-06-10 DIAGNOSIS — Z8042 Family history of malignant neoplasm of prostate: Secondary | ICD-10-CM | POA: Insufficient documentation

## 2021-06-10 DIAGNOSIS — K8020 Calculus of gallbladder without cholecystitis without obstruction: Secondary | ICD-10-CM | POA: Insufficient documentation

## 2021-06-10 DIAGNOSIS — Z5111 Encounter for antineoplastic chemotherapy: Secondary | ICD-10-CM | POA: Diagnosis not present

## 2021-06-10 DIAGNOSIS — I10 Essential (primary) hypertension: Secondary | ICD-10-CM | POA: Insufficient documentation

## 2021-06-10 DIAGNOSIS — Z79899 Other long term (current) drug therapy: Secondary | ICD-10-CM | POA: Insufficient documentation

## 2021-06-10 DIAGNOSIS — Z85828 Personal history of other malignant neoplasm of skin: Secondary | ICD-10-CM | POA: Insufficient documentation

## 2021-06-10 DIAGNOSIS — N400 Enlarged prostate without lower urinary tract symptoms: Secondary | ICD-10-CM | POA: Diagnosis not present

## 2021-06-10 DIAGNOSIS — I700 Atherosclerosis of aorta: Secondary | ICD-10-CM | POA: Diagnosis not present

## 2021-06-10 DIAGNOSIS — Z87442 Personal history of urinary calculi: Secondary | ICD-10-CM | POA: Diagnosis not present

## 2021-06-10 DIAGNOSIS — C775 Secondary and unspecified malignant neoplasm of intrapelvic lymph nodes: Secondary | ICD-10-CM | POA: Insufficient documentation

## 2021-06-10 DIAGNOSIS — C669 Malignant neoplasm of unspecified ureter: Secondary | ICD-10-CM

## 2021-06-10 DIAGNOSIS — C661 Malignant neoplasm of right ureter: Secondary | ICD-10-CM | POA: Diagnosis not present

## 2021-06-10 DIAGNOSIS — Z8249 Family history of ischemic heart disease and other diseases of the circulatory system: Secondary | ICD-10-CM | POA: Diagnosis not present

## 2021-06-10 DIAGNOSIS — T451X5A Adverse effect of antineoplastic and immunosuppressive drugs, initial encounter: Secondary | ICD-10-CM | POA: Insufficient documentation

## 2021-06-10 DIAGNOSIS — Z87440 Personal history of urinary (tract) infections: Secondary | ICD-10-CM | POA: Diagnosis not present

## 2021-06-10 LAB — COMPREHENSIVE METABOLIC PANEL
ALT: 24 U/L (ref 0–44)
AST: 35 U/L (ref 15–41)
Albumin: 3.7 g/dL (ref 3.5–5.0)
Alkaline Phosphatase: 50 U/L (ref 38–126)
Anion gap: 7 (ref 5–15)
BUN: 17 mg/dL (ref 8–23)
CO2: 26 mmol/L (ref 22–32)
Calcium: 8.7 mg/dL — ABNORMAL LOW (ref 8.9–10.3)
Chloride: 100 mmol/L (ref 98–111)
Creatinine, Ser: 0.95 mg/dL (ref 0.61–1.24)
GFR, Estimated: >60 mL/min (ref >60)
Glucose, Bld: 146 mg/dL — ABNORMAL HIGH (ref 70–99)
Potassium: 3.5 mmol/L (ref 3.5–5.1)
Sodium: 133 mmol/L — ABNORMAL LOW (ref 135–145)
Total Bilirubin: 0.5 mg/dL (ref 0.3–1.2)
Total Protein: 6.2 g/dL — ABNORMAL LOW (ref 6.5–8.1)

## 2021-06-10 LAB — CBC WITH DIFFERENTIAL/PLATELET
Abs Immature Granulocytes: 0.04 10*3/uL (ref 0.00–0.07)
Basophils Absolute: 0.1 10*3/uL (ref 0.0–0.1)
Basophils Relative: 1 %
Eosinophils Absolute: 0.1 10*3/uL (ref 0.0–0.5)
Eosinophils Relative: 1 %
HCT: 42 % (ref 39.0–52.0)
Hemoglobin: 14.3 g/dL (ref 13.0–17.0)
Immature Granulocytes: 0 %
Lymphocytes Relative: 20 %
Lymphs Abs: 1.9 10*3/uL (ref 0.7–4.0)
MCH: 29.9 pg (ref 26.0–34.0)
MCHC: 34 g/dL (ref 30.0–36.0)
MCV: 87.7 fL (ref 80.0–100.0)
Monocytes Absolute: 0.7 10*3/uL (ref 0.1–1.0)
Monocytes Relative: 8 %
Neutro Abs: 6.4 10*3/uL (ref 1.7–7.7)
Neutrophils Relative %: 70 %
Platelets: 304 10*3/uL (ref 150–400)
RBC: 4.79 MIL/uL (ref 4.22–5.81)
RDW: 15.9 % — ABNORMAL HIGH (ref 11.5–15.5)
WBC: 9.1 10*3/uL (ref 4.0–10.5)
nRBC: 0 % (ref 0.0–0.2)

## 2021-06-10 MED ORDER — SODIUM CHLORIDE 0.9 % IV SOLN
Freq: Once | INTRAVENOUS | Status: AC
  Filled 2021-06-10: qty 250

## 2021-06-10 MED ORDER — SODIUM CHLORIDE 0.9 % IV SOLN
10.0000 mg | Freq: Once | INTRAVENOUS | Status: AC
  Administered 2021-06-10: 10 mg via INTRAVENOUS
  Filled 2021-06-10: qty 10

## 2021-06-10 MED ORDER — HEPARIN SOD (PORK) LOCK FLUSH 100 UNIT/ML IV SOLN
INTRAVENOUS | Status: AC
  Administered 2021-06-10: 500 [IU]
  Filled 2021-06-10: qty 5

## 2021-06-10 MED ORDER — SODIUM CHLORIDE 0.9 % IV SOLN
90.0000 mg | Freq: Once | INTRAVENOUS | Status: AC
  Administered 2021-06-10: 90 mg via INTRAVENOUS
  Filled 2021-06-10: qty 9

## 2021-06-10 MED ORDER — PALONOSETRON HCL INJECTION 0.25 MG/5ML
0.2500 mg | Freq: Once | INTRAVENOUS | Status: AC
  Administered 2021-06-10: 0.25 mg via INTRAVENOUS
  Filled 2021-06-10: qty 5

## 2021-06-10 MED ORDER — HEPARIN SOD (PORK) LOCK FLUSH 100 UNIT/ML IV SOLN
500.0000 [IU] | Freq: Once | INTRAVENOUS | Status: AC | PRN
  Filled 2021-06-10: qty 5

## 2021-06-10 NOTE — Progress Notes (Signed)
? ? ? ?Hematology/Oncology Consult note ?[facility]  ?Telephone:(336) [phone] [phone] ? ?Patient Care Team: ?Idelle Crouch, MD as PCP - General (Internal Medicine) ?Sindy Guadeloupe, MD as Consulting Physician (Hematology and Oncology) ?Sindy Guadeloupe, MD as Consulting Physician (Hematology and Oncology) ?Sindy Guadeloupe, MD as Consulting Physician (Hematology and Oncology)  ? ?Name of the patient: Anthony Gonzales  ?[id number]  ?[birth year]  ? ?Date of visit: 06/10/21 ? ?Diagnosis-  Metastatic urothelial carcinoma with possible mets to the obturator node. Indeterminate hilar lymph nodes and LUL lung nodule currently in remission ? ?Chief complaint/ Reason for visit-on treatment assessment prior to cycle 12-day 1 of Padcev ? ?Heme/Onc history: patient is a 88 year old male with past medical history significant for hypertension and long-standing history of superficial bladder cancer for which he sees [name].  He has undergone TURBT as well as ureteroscopy since 2017 along with mitomycin as well in the past.  Most recently he underwent CT abdomen on 08/06/2017 which showed a soft tissue mass in the right renal pelvis concerning for upper tract urothelial neoplasm.  Soft tissue fullness at the right ureterovesical junction with proximal right hydroureteronephrosis.  Several millimeter attenuation lesion in the pancreatic head. ?  ?He underwent diagnostic ureteroscopy and was found to have 2 high-grade lesions within his right kidney.  There was a nodular high-grade appearing lesion in the right anterior renal pelvis.  There was also a second ureteral tumor fungating from the right ureteral orifice extending into the distal ureter also consistent with high-grade invasive urothelial carcinoma.  Muscle invasion could not be assessed.  He also underwent a CT chest which showed a rounded nodule in the right upper lobe measuring 14 mm concerning for metastases.  2 other 3 mm lesions were  also noted in the left upper lobe likely benign ?  ?Plan initially was neoadjuvant chemotherapy followed by possible surgery but given the presence of lung lesion patient has been referred to oncology for the same. ?  ?PET/CT on 09/21/17 showed: IMPRESSION: ?1. Hypermetabolic right obturator lymph node is most indicative of ?metastatic disease. ?2. Mildly hypermetabolic left hilar lymph nodes are nonspecific. ?Continued attention on follow-up exams is warranted. ?3. Right upper lobe pulmonary nodule shows metabolism after just ?above blood pool and is therefore indeterminate. Continued attention ?on follow-up exams is warranted. ?4. Aortic atherosclerosis (ICD10-170.0). Coronary artery ?calcification. ?5. Cholelithiasis ?  ? Carboplatin/ gemzar 1 week on and one-week off as patient could not tolerate 2-week on and one week off regimen.  Cycle 1 started on 09/22/2017 ?Disease progression in March 2020.  Switched to second line Keytruda ?  ?Patient had disease controlled with Keytruda.  However he was noted to have worsening dermatitis with constant flareups requiring steroids.  Beryle Flock is therefore being kept on hold and patient will be switched to third line Padcev.    Treatment has been on hold since February 2021 due to constant flareup of skin rash ?  ?Scans in October 2022 showed disease progression with new lung nodules as well as pelvic sidewall adenopathy.  Padcev restarted in November 2022 ? ?Interval history-patient had a flareup of skin rash over his left chest wall which improved with topical steroid cream.  He is following up with dermatology as well.He has rash over his bilateral forearms which has remained more or less stable and does not cause significant pruritus. ? ?ECOG PS- 1 ?Pain scale- 0 ? ?Review of systems- Review of Systems  ?Constitutional:  Positive for  malaise/fatigue. Negative for chills, fever and weight loss.  ?HENT:  Negative for congestion, ear discharge and nosebleeds.   ?Eyes:   Negative for blurred vision.  ?Respiratory:  Negative for cough, hemoptysis, sputum production, shortness of breath and wheezing.   ?Cardiovascular:  Negative for chest pain, palpitations, orthopnea and claudication.  ?Gastrointestinal:  Negative for abdominal pain, blood in stool, constipation, diarrhea, heartburn, melena, nausea and vomiting.  ?Genitourinary:  Negative for dysuria, flank pain, frequency, hematuria and urgency.  ?Musculoskeletal:  Negative for back pain, joint pain and myalgias.  ?Skin:  Positive for rash.  ?Neurological:  Negative for dizziness, tingling, focal weakness, seizures, weakness and headaches.  ?Endo/Heme/Allergies:  Does not bruise/bleed easily.  ?Psychiatric/Behavioral:  Negative for depression and suicidal ideas. The patient does not have insomnia.    ? ? ?Allergies  ?Allergen Reactions  ? Demerol [Meperidine] Nausea And Vomiting  ? Lipitor [Atorvastatin] Swelling  ? Sulfa Antibiotics Nausea And Vomiting and Rash  ? ? ? ?Past Medical History:  ?Diagnosis Date  ? Arthritis   ? Benign fibroma of prostate 08/23/2013  ? BPH (benign prostatic hyperplasia)   ? Calculus of kidney 08/23/2013  ? Glaucoma   ? no drops in 3 mo pressure good, pt denies glaucoma, eye pressure has been measuring alright.  ? History of kidney stones   ? HLD (hyperlipidemia)   ? HOH (hard of hearing)   ? Left Hearing Aid  ? HTN (hypertension) 12/26/2014  ? Hypertension   ? Hyponatremia 12/26/2014  ? Migraines   ? history of migraines when he was younger.  ? Restless leg syndrome   ? Sinus drainage   ? Skin cancer   ? Skin cancer   ? left hand 06/2019  ? Urothelial cancer ([place])   ? chemo tx's.  ? UTI (lower urinary tract infection) 12/26/2014  ? Vertigo   ? ? ? ?Past Surgical History:  ?Procedure Laterality Date  ? COLONOSCOPY    ? CYSTOSCOPY W/ RETROGRADES Right 01/30/2015  ? Procedure: CYSTOSCOPY WITH RETROGRADE PYELOGRAM;  Surgeon: [name], MD;  Location: [facility];  Service: Urology;  Laterality: Right;  ?  CYSTOSCOPY W/ RETROGRADES Bilateral 02/26/2016  ? Procedure: CYSTOSCOPY WITH RETROGRADE PYELOGRAM;  Surgeon: [name], MD;  Location: [facility];  Service: Urology;  Laterality: Bilateral;  ? CYSTOSCOPY W/ URETERAL STENT PLACEMENT Right 08/20/2015  ? Procedure: CYSTOSCOPY WITH RETROGRADE PYELOGRAM/POSSIBLE URETERAL STENT PLACEMENT/BLADDER BIOPSY;  Surgeon: [name], MD;  Location: [facility];  Service: Urology;  Laterality: Right;  ? CYSTOSCOPY W/ URETERAL STENT PLACEMENT Right 09/12/2015  ? Procedure: CYSTOSCOPY WITH STENT REPLACEMENT;  Surgeon: [name], MD;  Location: [facility];  Service: Urology;  Laterality: Right;  ? CYSTOSCOPY WITH BIOPSY Right 09/12/2015  ? Procedure: CYSTOSCOPY WITH BLADDER AND URETERAL BIOPSY;  Surgeon: [name], MD;  Location: [facility];  Service: Urology;  Laterality: Right;  ? CYSTOSCOPY WITH STENT PLACEMENT Right 01/30/2015  ? Procedure: CYSTOSCOPY WITH STENT PLACEMENT;  Surgeon: [name], MD;  Location: [facility];  Service: Urology;  Laterality: Right;  ? CYSTOSCOPY WITH STENT PLACEMENT Right 12/28/2017  ? Procedure: CYSTOSCOPY WITH STENT Exchange;  Surgeon: [name], MD;  Location: [facility];  Service: Urology;  Laterality: Right;  ? CYSTOSCOPY/URETEROSCOPY/HOLMIUM LASER/STENT PLACEMENT Right 08/19/2017  ? Procedure: CYSTOSCOPY/URETEROSCOPY/HOLMIUM LASER/STENT PLACEMENT;  Surgeon: [name], MD;  Location: [facility];  Service: Urology;  Laterality: Right;  ? EYE SURGERY Bilateral   ? Cataract Extraction with IOL  ? goiter removal    ? HOLMIUM LASER  APPLICATION N/A 3/94/3200  ? Procedure:  HOLMIUM LASER APPLICATION;  Surgeon: [name], MD;  Location: [facility];  Service: Urology;  Laterality: N/A;  ? PORTA CATH INSERTION N/A 09/23/2017  ? Procedure: PORTA CATH INSERTION;  Surgeon: Algernon Huxley, MD;  Location: [facility];  Service: Cardiovascular;  Laterality: N/A;  ? SPERMATOCELECTOMY    ? TONSILLECTOMY    ? TRANSURETHRAL RESECTION OF BLADDER  TUMOR N/A 02/26/2016  ? Procedure: TRANSURETHRAL RESECTION OF BLADDER TUMOR (TURBT);  Surgeon: [name], MD;  Location: [facility];  Service: Urology;  Laterality: N/A;  ? TRANSURETHRAL RESECTION OF BLAD

## 2021-06-10 NOTE — Progress Notes (Signed)
Rash has radiated to his chest; has been using Triamcinolone Dr.dasher prescribed and it has cleared up a lot. Also, states the neuropathy on his fingers seems to be getting worse (still able to button his shirts but getting more difficult) ?

## 2021-06-17 ENCOUNTER — Inpatient Hospital Stay: Payer: PPO

## 2021-06-17 VITALS — BP 126/68 | HR 65 | Temp 95.9°F | Resp 16

## 2021-06-17 DIAGNOSIS — Z5112 Encounter for antineoplastic immunotherapy: Secondary | ICD-10-CM | POA: Diagnosis not present

## 2021-06-17 DIAGNOSIS — C669 Malignant neoplasm of unspecified ureter: Secondary | ICD-10-CM

## 2021-06-17 LAB — CBC WITH DIFFERENTIAL/PLATELET
Abs Immature Granulocytes: 0.03 10*3/uL (ref 0.00–0.07)
Basophils Absolute: 0.1 10*3/uL (ref 0.0–0.1)
Basophils Relative: 1 %
Eosinophils Absolute: 0.1 10*3/uL (ref 0.0–0.5)
Eosinophils Relative: 1 %
HCT: 41.7 % (ref 39.0–52.0)
Hemoglobin: 14.1 g/dL (ref 13.0–17.0)
Immature Granulocytes: 0 %
Lymphocytes Relative: 23 %
Lymphs Abs: 1.7 10*3/uL (ref 0.7–4.0)
MCH: 29.9 pg (ref 26.0–34.0)
MCHC: 33.8 g/dL (ref 30.0–36.0)
MCV: 88.5 fL (ref 80.0–100.0)
Monocytes Absolute: 0.8 10*3/uL (ref 0.1–1.0)
Monocytes Relative: 11 %
Neutro Abs: 4.6 10*3/uL (ref 1.7–7.7)
Neutrophils Relative %: 64 %
Platelets: 305 10*3/uL (ref 150–400)
RBC: 4.71 MIL/uL (ref 4.22–5.81)
RDW: 15.6 % — ABNORMAL HIGH (ref 11.5–15.5)
WBC: 7.3 10*3/uL (ref 4.0–10.5)
nRBC: 0 % (ref 0.0–0.2)

## 2021-06-17 LAB — COMPREHENSIVE METABOLIC PANEL
ALT: 21 U/L (ref 0–44)
AST: 34 U/L (ref 15–41)
Albumin: 3.6 g/dL (ref 3.5–5.0)
Alkaline Phosphatase: 49 U/L (ref 38–126)
Anion gap: 8 (ref 5–15)
BUN: 18 mg/dL (ref 8–23)
CO2: 26 mmol/L (ref 22–32)
Calcium: 8.8 mg/dL — ABNORMAL LOW (ref 8.9–10.3)
Chloride: 101 mmol/L (ref 98–111)
Creatinine, Ser: 0.89 mg/dL (ref 0.61–1.24)
GFR, Estimated: >60 mL/min (ref >60)
Glucose, Bld: 138 mg/dL — ABNORMAL HIGH (ref 70–99)
Potassium: 3.7 mmol/L (ref 3.5–5.1)
Sodium: 135 mmol/L (ref 135–145)
Total Bilirubin: 0.7 mg/dL (ref 0.3–1.2)
Total Protein: 6.6 g/dL (ref 6.5–8.1)

## 2021-06-17 MED ORDER — SODIUM CHLORIDE 0.9 % IV SOLN
Freq: Once | INTRAVENOUS | Status: AC
  Filled 2021-06-17: qty 250

## 2021-06-17 MED ORDER — PALONOSETRON HCL INJECTION 0.25 MG/5ML
0.2500 mg | Freq: Once | INTRAVENOUS | Status: AC
  Administered 2021-06-17: 0.25 mg via INTRAVENOUS
  Filled 2021-06-17: qty 5

## 2021-06-17 MED ORDER — SODIUM CHLORIDE 0.9% FLUSH
10.0000 mL | Freq: Once | INTRAVENOUS | Status: AC
  Administered 2021-06-17: 10 mL via INTRAVENOUS
  Filled 2021-06-17: qty 10

## 2021-06-17 MED ORDER — HEPARIN SOD (PORK) LOCK FLUSH 100 UNIT/ML IV SOLN
500.0000 [IU] | Freq: Once | INTRAVENOUS | Status: AC | PRN
  Administered 2021-06-17: 500 [IU]
  Filled 2021-06-17: qty 5

## 2021-06-17 MED ORDER — SODIUM CHLORIDE 0.9 % IV SOLN
90.0000 mg | Freq: Once | INTRAVENOUS | Status: AC
  Administered 2021-06-17: 90 mg via INTRAVENOUS
  Filled 2021-06-17: qty 3

## 2021-06-17 MED ORDER — SODIUM CHLORIDE 0.9 % IV SOLN
10.0000 mg | Freq: Once | INTRAVENOUS | Status: AC
  Administered 2021-06-17: 10 mg via INTRAVENOUS
  Filled 2021-06-17: qty 10

## 2021-06-17 NOTE — Patient Instructions (Signed)
[facility]  Discharge Instructions: ?Thank you for choosing [facility] to provide your oncology and hematology care.  ?If you have a lab appointment with the Cancer Center, please go directly to the Cancer Center and check in at the registration area. ? ?Wear comfortable clothing and clothing appropriate for easy access to any Portacath or PICC line.  ? ?We strive to give you quality time with your provider. You may need to reschedule your appointment if you arrive late (15 or more minutes).  Arriving late affects you and other patients whose appointments are after yours.  Also, if you miss three or more appointments without notifying the office, you may be dismissed from the clinic at the provider?s discretion.    ?  ?For prescription refill requests, have your pharmacy contact our office and allow 72 hours for refills to be completed.   ? ?  ?To help prevent nausea and vomiting after your treatment, we encourage you to take your nausea medication as directed. ? ?BELOW ARE SYMPTOMS THAT SHOULD BE REPORTED IMMEDIATELY: ?*FEVER GREATER THAN 100.4 F (38 ?C) OR HIGHER ?*CHILLS OR SWEATING ?*NAUSEA AND VOMITING THAT IS NOT CONTROLLED WITH YOUR NAUSEA MEDICATION ?*UNUSUAL SHORTNESS OF BREATH ?*UNUSUAL BRUISING OR BLEEDING ?*URINARY PROBLEMS (pain or burning when urinating, or frequent urination) ?*BOWEL PROBLEMS (unusual diarrhea, constipation, pain near the anus) ?TENDERNESS IN MOUTH AND THROAT WITH OR WITHOUT PRESENCE OF ULCERS (sore throat, sores in mouth, or a toothache) ?UNUSUAL RASH, SWELLING OR PAIN  ?UNUSUAL VAGINAL DISCHARGE OR ITCHING  ? ?Items with * indicate a potential emergency and should be followed up as soon as possible or go to the Emergency Department if any problems should occur. ? ?Please show the CHEMOTHERAPY ALERT CARD or IMMUNOTHERAPY ALERT CARD at check-in to the Emergency Department and triage nurse. ? ?Should you have questions after your visit  or need to cancel or reschedule your appointment, please contact [facility] AT [facility]  [phone] and follow the prompts.  Office hours are 8:00 a.m. to 4:30 p.m. Monday - Friday. Please note that voicemails left after 4:00 p.m. may not be returned until the following business day.  We are closed weekends and major holidays. You have access to a nurse at all times for urgent questions. Please call the main number to the clinic [phone] and follow the prompts. ? ?For any non-urgent questions, you may also contact your provider using MyChart. We now offer e-Visits for anyone 18 and older to request care online for non-urgent symptoms. For details visit [email or web address]. ?  ?Also download the MyChart app! Go to the app store, search "MyChart", open the app, select [facility], and log in with your MyChart username and password. ? ?Due to Covid, a mask is required upon entering the hospital/clinic. If you do not have a mask, one will be given to you upon arrival. For doctor visits, patients may have 1 support person aged 18 or older with them. For treatment visits, patients cannot have anyone with them due to current Covid guidelines and our immunocompromised population.  ?

## 2021-07-01 ENCOUNTER — Inpatient Hospital Stay: Payer: PPO

## 2021-07-01 ENCOUNTER — Encounter: Payer: Self-pay | Admitting: Oncology

## 2021-07-01 ENCOUNTER — Inpatient Hospital Stay (HOSPITAL_BASED_OUTPATIENT_CLINIC_OR_DEPARTMENT_OTHER): Payer: PPO | Admitting: Oncology

## 2021-07-01 VITALS — BP 159/84 | HR 86 | Temp 97.7°F | Resp 16 | Ht 72.0 in | Wt 187.9 lb

## 2021-07-01 DIAGNOSIS — C669 Malignant neoplasm of unspecified ureter: Secondary | ICD-10-CM

## 2021-07-01 DIAGNOSIS — Z5112 Encounter for antineoplastic immunotherapy: Secondary | ICD-10-CM | POA: Diagnosis not present

## 2021-07-01 DIAGNOSIS — Z5111 Encounter for antineoplastic chemotherapy: Secondary | ICD-10-CM | POA: Diagnosis not present

## 2021-07-01 LAB — CBC WITH DIFFERENTIAL/PLATELET
Abs Immature Granulocytes: 0.03 10*3/uL (ref 0.00–0.07)
Basophils Absolute: 0.1 10*3/uL (ref 0.0–0.1)
Basophils Relative: 1 %
Eosinophils Absolute: 0.1 10*3/uL (ref 0.0–0.5)
Eosinophils Relative: 1 %
HCT: 41.6 % (ref 39.0–52.0)
Hemoglobin: 14.2 g/dL (ref 13.0–17.0)
Immature Granulocytes: 1 %
Lymphocytes Relative: 33 %
Lymphs Abs: 2 10*3/uL (ref 0.7–4.0)
MCH: 30.1 pg (ref 26.0–34.0)
MCHC: 34.1 g/dL (ref 30.0–36.0)
MCV: 88.1 fL (ref 80.0–100.0)
Monocytes Absolute: 0.5 10*3/uL (ref 0.1–1.0)
Monocytes Relative: 9 %
Neutro Abs: 3.4 10*3/uL (ref 1.7–7.7)
Neutrophils Relative %: 55 %
Platelets: 285 10*3/uL (ref 150–400)
RBC: 4.72 MIL/uL (ref 4.22–5.81)
RDW: 16.1 % — ABNORMAL HIGH (ref 11.5–15.5)
WBC: 6 10*3/uL (ref 4.0–10.5)
nRBC: 0 % (ref 0.0–0.2)

## 2021-07-01 LAB — COMPREHENSIVE METABOLIC PANEL
ALT: 28 U/L (ref 0–44)
AST: 37 U/L (ref 15–41)
Albumin: 3.6 g/dL (ref 3.5–5.0)
Alkaline Phosphatase: 54 U/L (ref 38–126)
Anion gap: 8 (ref 5–15)
BUN: 21 mg/dL (ref 8–23)
CO2: 25 mmol/L (ref 22–32)
Calcium: 9 mg/dL (ref 8.9–10.3)
Chloride: 103 mmol/L (ref 98–111)
Creatinine, Ser: 0.88 mg/dL (ref 0.61–1.24)
GFR, Estimated: >60 mL/min (ref >60)
Glucose, Bld: 171 mg/dL — ABNORMAL HIGH (ref 70–99)
Potassium: 3.8 mmol/L (ref 3.5–5.1)
Sodium: 136 mmol/L (ref 135–145)
Total Bilirubin: 0.7 mg/dL (ref 0.3–1.2)
Total Protein: 6.7 g/dL (ref 6.5–8.1)

## 2021-07-01 MED ORDER — SODIUM CHLORIDE 0.9% FLUSH
10.0000 mL | INTRAVENOUS | Status: DC | PRN
  Administered 2021-07-01: 10 mL via INTRAVENOUS
  Filled 2021-07-01: qty 10

## 2021-07-01 MED ORDER — HEPARIN SOD (PORK) LOCK FLUSH 100 UNIT/ML IV SOLN
500.0000 [IU] | Freq: Once | INTRAVENOUS | Status: AC
  Administered 2021-07-01: 500 [IU] via INTRAVENOUS
  Filled 2021-07-01: qty 5

## 2021-07-01 MED ORDER — HEPARIN SOD (PORK) LOCK FLUSH 100 UNIT/ML IV SOLN
500.0000 [IU] | Freq: Once | INTRAVENOUS | Status: AC | PRN
  Administered 2021-07-01: 500 [IU]
  Filled 2021-07-01: qty 5

## 2021-07-01 MED ORDER — SODIUM CHLORIDE 0.9 % IV SOLN
90.0000 mg | Freq: Once | INTRAVENOUS | Status: AC
  Administered 2021-07-01: 90 mg via INTRAVENOUS
  Filled 2021-07-01: qty 3

## 2021-07-01 MED ORDER — HEPARIN SOD (PORK) LOCK FLUSH 100 UNIT/ML IV SOLN
500.0000 [IU] | Freq: Once | INTRAVENOUS | Status: DC
  Filled 2021-07-01: qty 5

## 2021-07-01 MED ORDER — SODIUM CHLORIDE 0.9 % IV SOLN
10.0000 mg | Freq: Once | INTRAVENOUS | Status: AC
  Administered 2021-07-01: 10 mg via INTRAVENOUS
  Filled 2021-07-01: qty 10

## 2021-07-01 MED ORDER — PALONOSETRON HCL INJECTION 0.25 MG/5ML
0.2500 mg | Freq: Once | INTRAVENOUS | Status: AC
  Administered 2021-07-01: 0.25 mg via INTRAVENOUS
  Filled 2021-07-01: qty 5

## 2021-07-01 MED ORDER — SODIUM CHLORIDE 0.9 % IV SOLN
Freq: Once | INTRAVENOUS | Status: AC
  Filled 2021-07-01: qty 250

## 2021-07-01 NOTE — Patient Instructions (Signed)
[facility] AT [place]  Discharge Instructions: ?Thank you for choosing [facility] to provide your oncology and hematology care.  ?If you have a lab appointment with the [facility], please go directly to the [facility] and check in at the registration area. ? ?Wear comfortable clothing and clothing appropriate for easy access to any Portacath or PICC line.  ? ?We strive to give you quality time with your provider. You may need to reschedule your appointment if you arrive late (15 or more minutes).  Arriving late affects you and other patients whose appointments are after yours.  Also, if you miss three or more appointments without notifying the office, you may be dismissed from the clinic at the provider?s discretion.    ?  ?For prescription refill requests, have your pharmacy contact our office and allow 72 hours for refills to be completed.   ? ?Today you received the following chemotherapy and/or immunotherapy agents: Padcev    ?  ?To help prevent nausea and vomiting after your treatment, we encourage you to take your nausea medication as directed. ? ?BELOW ARE SYMPTOMS THAT SHOULD BE REPORTED IMMEDIATELY: ?*FEVER GREATER THAN 100.4 F (38 ?C) OR HIGHER ?*CHILLS OR SWEATING ?*NAUSEA AND VOMITING THAT IS NOT CONTROLLED WITH YOUR NAUSEA MEDICATION ?*UNUSUAL SHORTNESS OF BREATH ?*UNUSUAL BRUISING OR BLEEDING ?*URINARY PROBLEMS (pain or burning when urinating, or frequent urination) ?*BOWEL PROBLEMS (unusual diarrhea, constipation, pain near the anus) ?TENDERNESS IN MOUTH AND THROAT WITH OR WITHOUT PRESENCE OF ULCERS (sore throat, sores in mouth, or a toothache) ?UNUSUAL RASH, SWELLING OR PAIN  ?UNUSUAL VAGINAL DISCHARGE OR ITCHING  ? ?Items with * indicate a potential emergency and should be followed up as soon as possible or go to the Emergency Department if any problems should occur. ? ?Please show the CHEMOTHERAPY ALERT CARD or IMMUNOTHERAPY ALERT CARD at check-in to the  Emergency Department and triage nurse. ? ?Should you have questions after your visit or need to cancel or reschedule your appointment, please contact [facility] [place] AT [place]  [phone] and follow the prompts.  Office hours are 8:00 a.m. to 4:30 p.m. Monday - Friday. Please note that voicemails left after 4:00 p.m. may not be returned until the following business day.  We are closed weekends and major holidays. You have access to a nurse at all times for urgent questions. Please call the main number to the clinic [phone] and follow the prompts. ? ?For any non-urgent questions, you may also contact your provider using MyChart. We now offer e-Visits for anyone 76 and older to request care online for non-urgent symptoms. For details visit [email or web address]. ?  ?Also download the MyChart app! Go to the app store, search "MyChart", open the app, select [facility], and log in with your MyChart username and password. ? ?Due to Covid, a mask is required upon entering the hospital/clinic. If you do not have a mask, one will be given to you upon arrival. For doctor visits, patients may have 1 support person aged 86 or older with them. For treatment visits, patients cannot have anyone with them due to current Covid guidelines and our immunocompromised population.  ?

## 2021-07-01 NOTE — Progress Notes (Signed)
? ? ? ?Hematology/Oncology Consult note ?[place]  ?Telephone:(336) [phone] [phone] ? ?Patient Care Team: ?Idelle Crouch, MD as PCP - General (Internal Medicine) ?Sindy Guadeloupe, MD as Consulting Physician (Hematology and Oncology) ?Sindy Guadeloupe, MD as Consulting Physician (Hematology and Oncology) ?Sindy Guadeloupe, MD as Consulting Physician (Hematology and Oncology)  ? ?Name of the patient: Anthony Gonzales  ?[id number]  ?1931/03/25  ? ?Date of visit: 07/01/21 ? ?Diagnosis- Metastatic urothelial carcinoma with possible mets to the obturator node. Indeterminate hilar lymph nodes and LUL lung nodule ? ?Chief complaint/ Reason for visit-on treatment assessment prior to cycle 13-day 1 of Padcev ? ?Heme/Onc history:  patient is a 88 year old male with past medical history significant for hypertension and long-standing history of superficial bladder cancer for which he sees [name].  He has undergone TURBT as well as ureteroscopy since 2017 along with mitomycin as well in the past.  Most recently he underwent CT abdomen on 08/06/2017 which showed a soft tissue mass in the right renal pelvis concerning for upper tract urothelial neoplasm.  Soft tissue fullness at the right ureterovesical junction with proximal right hydroureteronephrosis.  Several millimeter attenuation lesion in the pancreatic head. ?  ?He underwent diagnostic ureteroscopy and was found to have 2 high-grade lesions within his right kidney.  There was a nodular high-grade appearing lesion in the right anterior renal pelvis.  There was also a second ureteral tumor fungating from the right ureteral orifice extending into the distal ureter also consistent with high-grade invasive urothelial carcinoma.  Muscle invasion could not be assessed.  He also underwent a CT chest which showed a rounded nodule in the right upper lobe measuring 14 mm concerning for metastases.  2 other 3 mm lesions were also noted in the left  upper lobe likely benign ?  ?Plan initially was neoadjuvant chemotherapy followed by possible surgery but given the presence of lung lesion patient has been referred to oncology for the same. ?  ?PET/CT on 09/21/17 showed: IMPRESSION: ?1. Hypermetabolic right obturator lymph node is most indicative of ?metastatic disease. ?2. Mildly hypermetabolic left hilar lymph nodes are nonspecific. ?Continued attention on follow-up exams is warranted. ?3. Right upper lobe pulmonary nodule shows metabolism after just ?above blood pool and is therefore indeterminate. Continued attention ?on follow-up exams is warranted. ?4. Aortic atherosclerosis (ICD10-170.0). Coronary artery ?calcification. ?5. Cholelithiasis ?  ? Carboplatin/ gemzar 1 week on and one-week off as patient could not tolerate 2-week on and one week off regimen.  Cycle 1 started on 09/22/2017 ?Disease progression in March 2020.  Switched to second line Keytruda ?  ?Patient had disease controlled with Keytruda.  However he was noted to have worsening dermatitis with constant flareups requiring steroids.  Beryle Flock is therefore being kept on hold and patient will be switched to third line Padcev.    Treatment has been on hold since February 2021 due to constant flareup of skin rash ?  ?Scans in October 2022 showed disease progression with new lung nodules as well as pelvic sidewall adenopathy.  Padcev restarted in November 2022 ? ?Interval history-tolerating treatments well so far.  He is stable skin rash over his bilateral forearms for which she uses topical emollients and it is working well for him.  Also has symptoms of dry eyes controlled with artificial tears ? ?ECOG PS- 1 ?Pain scale- 0 ? ? ?Review of systems- Review of Systems  ?Constitutional:  Negative for chills, fever, malaise/fatigue and weight loss.  ?HENT:  Negative for congestion, ear discharge and nosebleeds.   ?Eyes:  Negative for blurred vision.  ?Respiratory:  Negative for cough, hemoptysis, sputum  production, shortness of breath and wheezing.   ?Cardiovascular:  Negative for chest pain, palpitations, orthopnea and claudication.  ?Gastrointestinal:  Negative for abdominal pain, blood in stool, constipation, diarrhea, heartburn, melena, nausea and vomiting.  ?Genitourinary:  Negative for dysuria, flank pain, frequency, hematuria and urgency.  ?Musculoskeletal:  Negative for back pain, joint pain and myalgias.  ?Skin:  Negative for rash.  ?Neurological:  Negative for dizziness, tingling, focal weakness, seizures, weakness and headaches.  ?Endo/Heme/Allergies:  Does not bruise/bleed easily.  ?Psychiatric/Behavioral:  Negative for depression and suicidal ideas. The patient does not have insomnia.    ? ? ? ?Allergies  ?Allergen Reactions  ? Demerol [Meperidine] Nausea And Vomiting  ? Lipitor [Atorvastatin] Swelling  ? Sulfa Antibiotics Nausea And Vomiting and Rash  ? ? ? ?Past Medical History:  ?Diagnosis Date  ? Arthritis   ? Benign fibroma of prostate 08/23/2013  ? BPH (benign prostatic hyperplasia)   ? Calculus of kidney 08/23/2013  ? Glaucoma   ? no drops in 3 mo pressure good, pt denies glaucoma, eye pressure has been measuring alright.  ? History of kidney stones   ? HLD (hyperlipidemia)   ? HOH (hard of hearing)   ? Left Hearing Aid  ? HTN (hypertension) 12/26/2014  ? Hypertension   ? Hyponatremia 12/26/2014  ? Migraines   ? history of migraines when he was younger.  ? Restless leg syndrome   ? Sinus drainage   ? Skin cancer   ? Skin cancer   ? left hand 06/2019  ? Urothelial cancer ([place])   ? chemo tx's.  ? UTI (lower urinary tract infection) 12/26/2014  ? Vertigo   ? ? ? ?Past Surgical History:  ?Procedure Laterality Date  ? COLONOSCOPY    ? CYSTOSCOPY W/ RETROGRADES Right 01/30/2015  ? Procedure: CYSTOSCOPY WITH RETROGRADE PYELOGRAM;  Surgeon: [name], MD;  Location: [facility];  Service: Urology;  Laterality: Right;  ? CYSTOSCOPY W/ RETROGRADES Bilateral 02/26/2016  ? Procedure: CYSTOSCOPY WITH  RETROGRADE PYELOGRAM;  Surgeon: [name], MD;  Location: [facility];  Service: Urology;  Laterality: Bilateral;  ? CYSTOSCOPY W/ URETERAL STENT PLACEMENT Right 08/20/2015  ? Procedure: CYSTOSCOPY WITH RETROGRADE PYELOGRAM/POSSIBLE URETERAL STENT PLACEMENT/BLADDER BIOPSY;  Surgeon: [name], MD;  Location: [facility];  Service: Urology;  Laterality: Right;  ? CYSTOSCOPY W/ URETERAL STENT PLACEMENT Right 09/12/2015  ? Procedure: CYSTOSCOPY WITH STENT REPLACEMENT;  Surgeon: [name], MD;  Location: [facility];  Service: Urology;  Laterality: Right;  ? CYSTOSCOPY WITH BIOPSY Right 09/12/2015  ? Procedure: CYSTOSCOPY WITH BLADDER AND URETERAL BIOPSY;  Surgeon: [name], MD;  Location: [facility];  Service: Urology;  Laterality: Right;  ? CYSTOSCOPY WITH STENT PLACEMENT Right 01/30/2015  ? Procedure: CYSTOSCOPY WITH STENT PLACEMENT;  Surgeon: [name], MD;  Location: [facility];  Service: Urology;  Laterality: Right;  ? CYSTOSCOPY WITH STENT PLACEMENT Right 12/28/2017  ? Procedure: CYSTOSCOPY WITH STENT Exchange;  Surgeon: [name], MD;  Location: [facility];  Service: Urology;  Laterality: Right;  ? CYSTOSCOPY/URETEROSCOPY/HOLMIUM LASER/STENT PLACEMENT Right 08/19/2017  ? Procedure: CYSTOSCOPY/URETEROSCOPY/HOLMIUM LASER/STENT PLACEMENT;  Surgeon: [name], MD;  Location: [facility];  Service: Urology;  Laterality: Right;  ? EYE SURGERY Bilateral   ? Cataract Extraction with IOL  ? goiter removal    ? HOLMIUM LASER APPLICATION N/A 3/81/0175  ? Procedure:  HOLMIUM LASER APPLICATION;  Surgeon: [name], MD;  Location: [facility];  Service: Urology;  Laterality: N/A;  ? PORTA CATH INSERTION N/A 09/23/2017  ? Procedure: PORTA CATH INSERTION;  Surgeon: Algernon Huxley, MD;  Location: [facility];  Service: Cardiovascular;  Laterality: N/A;  ? SPERMATOCELECTOMY    ? TONSILLECTOMY    ? TRANSURETHRAL RESECTION OF BLADDER TUMOR N/A 02/26/2016  ? Procedure: TRANSURETHRAL RESECTION OF BLADDER TUMOR  (TURBT);  Surgeon: [name], MD;  Location: [facility];  Service: Urology;  Laterality: N/A;  ? TRANSURETHRAL RESECTION OF BLADDER TUMOR WITH MITOMYCIN-C N/A 09/12/2015  ? Procedure: TRANSURETHRAL RESECTION O

## 2021-07-05 ENCOUNTER — Other Ambulatory Visit: Payer: PPO

## 2021-07-08 ENCOUNTER — Inpatient Hospital Stay: Payer: PPO

## 2021-07-08 ENCOUNTER — Inpatient Hospital Stay: Payer: PPO | Attending: Oncology

## 2021-07-08 ENCOUNTER — Other Ambulatory Visit: Payer: PPO

## 2021-07-08 ENCOUNTER — Ambulatory Visit: Payer: PPO

## 2021-07-08 ENCOUNTER — Ambulatory Visit: Payer: PPO | Admitting: Oncology

## 2021-07-08 VITALS — BP 129/72 | HR 73 | Temp 97.7°F | Resp 18 | Wt 189.5 lb

## 2021-07-08 DIAGNOSIS — I2510 Atherosclerotic heart disease of native coronary artery without angina pectoris: Secondary | ICD-10-CM | POA: Insufficient documentation

## 2021-07-08 DIAGNOSIS — Z8042 Family history of malignant neoplasm of prostate: Secondary | ICD-10-CM | POA: Diagnosis not present

## 2021-07-08 DIAGNOSIS — Z5112 Encounter for antineoplastic immunotherapy: Secondary | ICD-10-CM | POA: Diagnosis not present

## 2021-07-08 DIAGNOSIS — I700 Atherosclerosis of aorta: Secondary | ICD-10-CM | POA: Insufficient documentation

## 2021-07-08 DIAGNOSIS — Z79899 Other long term (current) drug therapy: Secondary | ICD-10-CM | POA: Insufficient documentation

## 2021-07-08 DIAGNOSIS — C661 Malignant neoplasm of right ureter: Secondary | ICD-10-CM | POA: Diagnosis not present

## 2021-07-08 DIAGNOSIS — K8020 Calculus of gallbladder without cholecystitis without obstruction: Secondary | ICD-10-CM | POA: Diagnosis not present

## 2021-07-08 DIAGNOSIS — Z8249 Family history of ischemic heart disease and other diseases of the circulatory system: Secondary | ICD-10-CM | POA: Insufficient documentation

## 2021-07-08 DIAGNOSIS — C669 Malignant neoplasm of unspecified ureter: Secondary | ICD-10-CM

## 2021-07-08 LAB — COMPREHENSIVE METABOLIC PANEL
ALT: 25 U/L (ref 0–44)
AST: 37 U/L (ref 15–41)
Albumin: 3.5 g/dL (ref 3.5–5.0)
Alkaline Phosphatase: 53 U/L (ref 38–126)
Anion gap: 4 — ABNORMAL LOW (ref 5–15)
BUN: 17 mg/dL (ref 8–23)
CO2: 26 mmol/L (ref 22–32)
Calcium: 8.3 mg/dL — ABNORMAL LOW (ref 8.9–10.3)
Chloride: 103 mmol/L (ref 98–111)
Creatinine, Ser: 0.91 mg/dL (ref 0.61–1.24)
GFR, Estimated: >60 mL/min (ref >60)
Glucose, Bld: 123 mg/dL — ABNORMAL HIGH (ref 70–99)
Potassium: 3.8 mmol/L (ref 3.5–5.1)
Sodium: 133 mmol/L — ABNORMAL LOW (ref 135–145)
Total Bilirubin: 1 mg/dL (ref 0.3–1.2)
Total Protein: 6.6 g/dL (ref 6.5–8.1)

## 2021-07-08 LAB — CBC WITH DIFFERENTIAL/PLATELET
Abs Immature Granulocytes: 0.02 10*3/uL (ref 0.00–0.07)
Basophils Absolute: 0 10*3/uL (ref 0.0–0.1)
Basophils Relative: 1 %
Eosinophils Absolute: 0.1 10*3/uL (ref 0.0–0.5)
Eosinophils Relative: 1 %
HCT: 41.5 % (ref 39.0–52.0)
Hemoglobin: 14.1 g/dL (ref 13.0–17.0)
Immature Granulocytes: 0 %
Lymphocytes Relative: 33 %
Lymphs Abs: 1.8 10*3/uL (ref 0.7–4.0)
MCH: 30 pg (ref 26.0–34.0)
MCHC: 34 g/dL (ref 30.0–36.0)
MCV: 88.3 fL (ref 80.0–100.0)
Monocytes Absolute: 0.7 10*3/uL (ref 0.1–1.0)
Monocytes Relative: 12 %
Neutro Abs: 3 10*3/uL (ref 1.7–7.7)
Neutrophils Relative %: 53 %
Platelets: 261 10*3/uL (ref 150–400)
RBC: 4.7 MIL/uL (ref 4.22–5.81)
RDW: 15.9 % — ABNORMAL HIGH (ref 11.5–15.5)
WBC: 5.6 10*3/uL (ref 4.0–10.5)
nRBC: 0 % (ref 0.0–0.2)

## 2021-07-08 MED ORDER — SODIUM CHLORIDE 0.9 % IV SOLN
10.0000 mg | Freq: Once | INTRAVENOUS | Status: AC
  Administered 2021-07-08: 10 mg via INTRAVENOUS
  Filled 2021-07-08: qty 10

## 2021-07-08 MED ORDER — SODIUM CHLORIDE 0.9% FLUSH
10.0000 mL | INTRAVENOUS | Status: DC | PRN
  Filled 2021-07-08: qty 10

## 2021-07-08 MED ORDER — SODIUM CHLORIDE 0.9 % IV SOLN
Freq: Once | INTRAVENOUS | Status: AC
  Filled 2021-07-08: qty 250

## 2021-07-08 MED ORDER — SODIUM CHLORIDE 0.9 % IV SOLN
90.0000 mg | Freq: Once | INTRAVENOUS | Status: AC
  Administered 2021-07-08: 90 mg via INTRAVENOUS
  Filled 2021-07-08: qty 9

## 2021-07-08 MED ORDER — PALONOSETRON HCL INJECTION 0.25 MG/5ML
0.2500 mg | Freq: Once | INTRAVENOUS | Status: AC
  Administered 2021-07-08: 0.25 mg via INTRAVENOUS
  Filled 2021-07-08: qty 5

## 2021-07-08 MED ORDER — HEPARIN SOD (PORK) LOCK FLUSH 100 UNIT/ML IV SOLN
500.0000 [IU] | Freq: Once | INTRAVENOUS | Status: AC | PRN
  Administered 2021-07-08: 500 [IU]
  Filled 2021-07-08: qty 5

## 2021-07-08 NOTE — Patient Instructions (Signed)
[facility] CANCER CTR AT [place]  Discharge Instructions: ?Thank you for choosing [facility] to provide your oncology and hematology care.  ?If you have a lab appointment with the [facility], please go directly to the [facility] and check in at the registration area. ? ?Wear comfortable clothing and clothing appropriate for easy access to any Portacath or PICC line.  ? ?We strive to give you quality time with your provider. You may need to reschedule your appointment if you arrive late (15 or more minutes).  Arriving late affects you and other patients whose appointments are after yours.  Also, if you miss three or more appointments without notifying the office, you may be dismissed from the clinic at the provider?s discretion.    ?  ?For prescription refill requests, have your pharmacy contact our office and allow 72 hours for refills to be completed.   ? ?Today you received the following chemotherapy and/or immunotherapy agents Padcev    ?  ?To help prevent nausea and vomiting after your treatment, we encourage you to take your nausea medication as directed. ? ?BELOW ARE SYMPTOMS THAT SHOULD BE REPORTED IMMEDIATELY: ?*FEVER GREATER THAN 100.4 F (38 ?C) OR HIGHER ?*CHILLS OR SWEATING ?*NAUSEA AND VOMITING THAT IS NOT CONTROLLED WITH YOUR NAUSEA MEDICATION ?*UNUSUAL SHORTNESS OF BREATH ?*UNUSUAL BRUISING OR BLEEDING ?*URINARY PROBLEMS (pain or burning when urinating, or frequent urination) ?*BOWEL PROBLEMS (unusual diarrhea, constipation, pain near the anus) ?TENDERNESS IN MOUTH AND THROAT WITH OR WITHOUT PRESENCE OF ULCERS (sore throat, sores in mouth, or a toothache) ?UNUSUAL RASH, SWELLING OR PAIN  ?UNUSUAL VAGINAL DISCHARGE OR ITCHING  ? ?Items with * indicate a potential emergency and should be followed up as soon as possible or go to the Emergency Department if any problems should occur. ? ?Please show the CHEMOTHERAPY ALERT CARD or IMMUNOTHERAPY ALERT CARD at check-in to the  Emergency Department and triage nurse. ? ?Should you have questions after your visit or need to cancel or reschedule your appointment, please contact [facility] CANCER [place] AT [place]  [phone] and follow the prompts.  Office hours are 8:00 a.m. to 4:30 p.m. Monday - Friday. Please note that voicemails left after 4:00 p.m. may not be returned until the following business day.  We are closed weekends and major holidays. You have access to a nurse at all times for urgent questions. Please call the main number to the clinic [phone] and follow the prompts. ? ?For any non-urgent questions, you may also contact your provider using MyChart. We now offer e-Visits for anyone 87 and older to request care online for non-urgent symptoms. For details visit [email or web address]. ?  ?Also download the MyChart app! Go to the app store, search "MyChart", open the app, select [place], and log in with your MyChart username and password. ? ?Due to Covid, a mask is required upon entering the hospital/clinic. If you do not have a mask, one will be given to you upon arrival. For doctor visits, patients may have 1 support person aged 20 or older with them. For treatment visits, patients cannot have anyone with them due to current Covid guidelines and our immunocompromised population.  ?

## 2021-07-08 NOTE — Progress Notes (Signed)
Calcium 8.3. Per Dr. Janese Banks, IV calcium not necessary at this time. Patient educated on Calcium rich foods/drinks.  ?

## 2021-07-11 ENCOUNTER — Other Ambulatory Visit: Payer: Self-pay

## 2021-07-11 ENCOUNTER — Telehealth: Payer: Self-pay | Admitting: *Deleted

## 2021-07-11 DIAGNOSIS — I10 Essential (primary) hypertension: Secondary | ICD-10-CM | POA: Diagnosis not present

## 2021-07-11 DIAGNOSIS — E7800 Pure hypercholesterolemia, unspecified: Secondary | ICD-10-CM | POA: Diagnosis not present

## 2021-07-11 DIAGNOSIS — C649 Malignant neoplasm of unspecified kidney, except renal pelvis: Secondary | ICD-10-CM | POA: Diagnosis not present

## 2021-07-11 DIAGNOSIS — L989 Disorder of the skin and subcutaneous tissue, unspecified: Secondary | ICD-10-CM | POA: Diagnosis not present

## 2021-07-11 DIAGNOSIS — H02051 Trichiasis without entropian right upper eyelid: Secondary | ICD-10-CM | POA: Diagnosis not present

## 2021-07-11 DIAGNOSIS — R739 Hyperglycemia, unspecified: Secondary | ICD-10-CM | POA: Diagnosis not present

## 2021-07-11 DIAGNOSIS — G529 Cranial nerve disorder, unspecified: Secondary | ICD-10-CM

## 2021-07-11 DIAGNOSIS — G720 Drug-induced myopathy: Secondary | ICD-10-CM | POA: Diagnosis not present

## 2021-07-11 DIAGNOSIS — T466X5A Adverse effect of antihyperlipidemic and antiarteriosclerotic drugs, initial encounter: Secondary | ICD-10-CM | POA: Diagnosis not present

## 2021-07-11 DIAGNOSIS — Z79899 Other long term (current) drug therapy: Secondary | ICD-10-CM | POA: Diagnosis not present

## 2021-07-11 NOTE — Telephone Encounter (Signed)
Dr. Janese Banks got a call from Dr. George Ina at the [facility] to [place] [facility]. 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 he needs mri brain with and without contrast asap.  Patient was told that he needed an MRI.  The appointment for the MRI is on May fifth at 3:00.  Anderson Malta called him and told him about the date and time and patient will be there ?

## 2021-07-12 ENCOUNTER — Other Ambulatory Visit: Payer: Self-pay

## 2021-07-12 ENCOUNTER — Ambulatory Visit
Admission: RE | Admit: 2021-07-12 | Discharge: 2021-07-12 | Disposition: A | Discharge status: Home / Self Care | Payer: PPO | Source: Ambulatory Visit | Attending: Oncology | Admitting: Oncology

## 2021-07-12 ENCOUNTER — Telehealth: Payer: Self-pay | Admitting: Oncology

## 2021-07-12 ENCOUNTER — Emergency Department
Admission: EM | Admit: 2021-07-12 | Discharge: 2021-07-13 | Discharge status: Against Medical Advice | Payer: PPO | Attending: Emergency Medicine | Admitting: Emergency Medicine

## 2021-07-12 DIAGNOSIS — G529 Cranial nerve disorder, unspecified: Secondary | ICD-10-CM | POA: Insufficient documentation

## 2021-07-12 DIAGNOSIS — N183 Chronic kidney disease, stage 3 unspecified: Secondary | ICD-10-CM | POA: Diagnosis not present

## 2021-07-12 DIAGNOSIS — Z8554 Personal history of malignant neoplasm of ureter: Secondary | ICD-10-CM | POA: Diagnosis not present

## 2021-07-12 DIAGNOSIS — Z7902 Long term (current) use of antithrombotics/antiplatelets: Secondary | ICD-10-CM | POA: Insufficient documentation

## 2021-07-12 DIAGNOSIS — H538 Other visual disturbances: Secondary | ICD-10-CM | POA: Diagnosis present

## 2021-07-12 DIAGNOSIS — I639 Cerebral infarction, unspecified: Secondary | ICD-10-CM | POA: Insufficient documentation

## 2021-07-12 DIAGNOSIS — I129 Hypertensive chronic kidney disease with stage 1 through stage 4 chronic kidney disease, or unspecified chronic kidney disease: Secondary | ICD-10-CM | POA: Insufficient documentation

## 2021-07-12 DIAGNOSIS — Z85828 Personal history of other malignant neoplasm of skin: Secondary | ICD-10-CM | POA: Insufficient documentation

## 2021-07-12 DIAGNOSIS — Z0289 Encounter for other administrative examinations: Secondary | ICD-10-CM | POA: Diagnosis not present

## 2021-07-12 DIAGNOSIS — H532 Diplopia: Secondary | ICD-10-CM | POA: Diagnosis not present

## 2021-07-12 LAB — DIFFERENTIAL
Abs Immature Granulocytes: 0.02 10*3/uL (ref 0.00–0.07)
Basophils Absolute: 0.1 10*3/uL (ref 0.0–0.1)
Basophils Relative: 1 %
Eosinophils Absolute: 0.1 10*3/uL (ref 0.0–0.5)
Eosinophils Relative: 2 %
Immature Granulocytes: 0 %
Lymphocytes Relative: 32 %
Lymphs Abs: 1.7 10*3/uL (ref 0.7–4.0)
Monocytes Absolute: 0.9 10*3/uL (ref 0.1–1.0)
Monocytes Relative: 16 %
Neutro Abs: 2.7 10*3/uL (ref 1.7–7.7)
Neutrophils Relative %: 49 %

## 2021-07-12 LAB — CBC
HCT: 40.1 % (ref 39.0–52.0)
Hemoglobin: 13.4 g/dL (ref 13.0–17.0)
MCH: 29.8 pg (ref 26.0–34.0)
MCHC: 33.4 g/dL (ref 30.0–36.0)
MCV: 89.1 fL (ref 80.0–100.0)
Platelets: 236 10*3/uL (ref 150–400)
RBC: 4.5 MIL/uL (ref 4.22–5.81)
RDW: 16.3 % — ABNORMAL HIGH (ref 11.5–15.5)
WBC: 5.5 10*3/uL (ref 4.0–10.5)
nRBC: 0 % (ref 0.0–0.2)

## 2021-07-12 LAB — COMPREHENSIVE METABOLIC PANEL
ALT: 31 U/L (ref 0–44)
AST: 41 U/L (ref 15–41)
Albumin: 3.6 g/dL (ref 3.5–5.0)
Alkaline Phosphatase: 66 U/L (ref 38–126)
Anion gap: 6 (ref 5–15)
BUN: 24 mg/dL — ABNORMAL HIGH (ref 8–23)
CO2: 29 mmol/L (ref 22–32)
Calcium: 9.1 mg/dL (ref 8.9–10.3)
Chloride: 102 mmol/L (ref 98–111)
Creatinine, Ser: 0.93 mg/dL (ref 0.61–1.24)
GFR, Estimated: >60 mL/min (ref >60)
Glucose, Bld: 118 mg/dL — ABNORMAL HIGH (ref 70–99)
Potassium: 4 mmol/L (ref 3.5–5.1)
Sodium: 137 mmol/L (ref 135–145)
Total Bilirubin: 0.7 mg/dL (ref 0.3–1.2)
Total Protein: 6.6 g/dL (ref 6.5–8.1)

## 2021-07-12 LAB — APTT: aPTT: 30 seconds (ref 24–36)

## 2021-07-12 LAB — PROTIME-INR
INR: 1 (ref 0.8–1.2)
Prothrombin Time: 13.1 seconds (ref 11.4–15.2)

## 2021-07-12 LAB — ETHANOL: Alcohol, Ethyl (B): <10 mg/dL (ref <10)

## 2021-07-12 MED ORDER — CLOPIDOGREL BISULFATE 75 MG PO TABS
75.0000 mg | ORAL_TABLET | Freq: Every day | ORAL | 0 refills | Status: DC
Start: 2021-07-12 — End: 2021-07-22

## 2021-07-12 MED ORDER — ASPIRIN 81 MG PO CHEW
324.0000 mg | CHEWABLE_TABLET | Freq: Once | ORAL | Status: AC
  Administered 2021-07-12: 324 mg via ORAL
  Filled 2021-07-12: qty 4

## 2021-07-12 MED ORDER — GADOBUTROL 1 MMOL/ML IV SOLN
7.5000 mL | Freq: Once | INTRAVENOUS | Status: AC | PRN
  Administered 2021-07-12: 7.5 mL via INTRAVENOUS

## 2021-07-12 MED ORDER — CLOPIDOGREL BISULFATE 75 MG PO TABS
300.0000 mg | ORAL_TABLET | Freq: Once | ORAL | Status: AC
  Administered 2021-07-12: 300 mg via ORAL
  Filled 2021-07-12: qty 4

## 2021-07-12 NOTE — Discharge Instructions (Addendum)
1.  Take baby aspirin daily in conjunction with  ?Plavix 75mg  daily. ?2.  You are leaving [place].  You understand the risk of doing so may result in permanent disability or death.  You are welcome back at any time if you change your mind or have recurrent or worsening symptoms, persistent vomiting, lethargy, difficulty breathing or other concerns. ?

## 2021-07-12 NOTE — ED Triage Notes (Signed)
Presents to ED by POV for vision changes x3 weeks. 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 Called to come to ED after MRI read.  ?

## 2021-07-12 NOTE — Telephone Encounter (Signed)
Radiology called Mri brain showed 63mm area suspecting acute/subacute stroke. vestibular schwannoma, No specific cause of the reported trochlear nerve palsy is identified. ?Patient was called and he denies any other neurological deficit except double vision for 2-3 weeks.  I recommend patient to go to ER for work up of acute/subacute stroke. He declined. " I will take my chances". He will take Aspirin 81mg  daily and call Dr.Rao's office next Monday.  ?Cc Dr.Rao ?

## 2021-07-12 NOTE — ED Triage Notes (Incomplete Revision)
Presents to ED by POV for vision changes x3 weeks. 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 Called to come to ED after MRI read.  ? 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 Last chemo was last Monday.  ?

## 2021-07-12 NOTE — ED Provider Notes (Signed)
? ?[facility] ?Provider Note ? ? ? Event Date/Time  ? First MD Initiated Contact with Patient 07/12/21 2330   ?  (approximate) ? ? ?History  ? ?Cerebrovascular Accident ? ? ?HPI ? ?Anthony Gonzales is a 88 y.o. male referred to the ED from his oncologist for abnormal MRI.  Patient has had vision changes for the past 3 weeks.  Saw ophthalmology Dr. George Ina who noted 4th cranial nerve palsy and sent patient back to his oncologist who had urgent MRI done which was positive for acute/subacute CVA and vestibular schwannoma.  He already tells me that he did not want to, and does not want to be here.  Reports vision changes do not bother him on a daily basis.  Denies slurred speech, facial droop, altered mentation, extremity weakness/numbness/tingling, dizziness.  Denies recent fever, cough, chest pain or shortness of breath. ?  ? ? ?Past Medical History  ? ?Past Medical History:  ?Diagnosis Date  ? Arthritis   ? Benign fibroma of prostate 08/23/2013  ? BPH (benign prostatic hyperplasia)   ? Calculus of kidney 08/23/2013  ? Glaucoma   ? no drops in 3 mo pressure good, pt denies glaucoma, eye pressure has been measuring alright.  ? History of kidney stones   ? HLD (hyperlipidemia)   ? HOH (hard of hearing)   ? Left Hearing Aid  ? HTN (hypertension) 12/26/2014  ? Hypertension   ? Hyponatremia 12/26/2014  ? Migraines   ? history of migraines when he was younger.  ? Restless leg syndrome   ? Sinus drainage   ? Skin cancer   ? Skin cancer   ? left hand 06/2019  ? Urothelial cancer ([place])   ? chemo tx's.  ? UTI (lower urinary tract infection) 12/26/2014  ? Vertigo   ? ? ? ?Active Problem List  ? ?Patient Active Problem List  ? Diagnosis Date Noted  ? Hyperglycemia 08/10/2019  ? Maculopapular rash 02/07/2019  ? History of immunotherapy 02/07/2019  ? Statin myopathy 11/25/2018  ? Goals of care, counseling/discussion 09/17/2017  ? 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 Renal cancer,  unspecified laterality ([place]) 08/27/2017  ? Abdominal pain   ? Emesis   ? UTI (lower urinary tract infection) 12/26/2014  ? Hyponatremia 12/26/2014  ? HTN (hypertension) 12/26/2014  ? HLD (hyperlipidemia) 12/26/2014  ? BPH (benign prostatic hyperplasia) 12/26/2014  ? Glaucoma 12/26/2014  ? CKD (chronic kidney disease), stage III ([place]) 12/26/2014  ? H/O adenomatous polyp of colon 10/07/2013  ? Benign fibroma of prostate 08/23/2013  ? Allergy to environmental factors 08/23/2013  ? History of migraine headaches 08/23/2013  ? Calculus of kidney 08/23/2013  ? Head revolving around 08/23/2013  ? ? ? ?Past Surgical History  ? ?Past Surgical History:  ?Procedure Laterality Date  ? COLONOSCOPY    ? CYSTOSCOPY W/ RETROGRADES Right 01/30/2015  ? Procedure: CYSTOSCOPY WITH RETROGRADE PYELOGRAM;  Surgeon: [name], MD;  Location: [facility] ORS;  Service: Urology;  Laterality: Right;  ? CYSTOSCOPY W/ RETROGRADES Bilateral 02/26/2016  ? Procedure: CYSTOSCOPY WITH RETROGRADE PYELOGRAM;  Surgeon: [name], MD;  Location: [facility] ORS;  Service: Urology;  Laterality: Bilateral;  ? CYSTOSCOPY W/ URETERAL STENT PLACEMENT Right 08/20/2015  ? Procedure: CYSTOSCOPY WITH RETROGRADE PYELOGRAM/POSSIBLE URETERAL STENT PLACEMENT/BLADDER BIOPSY;  Surgeon: [name], MD;  Location: [facility] ORS;  Service: Urology;  Laterality: Right;  ? CYSTOSCOPY W/ URETERAL STENT PLACEMENT Right 09/12/2015  ? Procedure: CYSTOSCOPY WITH STENT REPLACEMENT;  Surgeon: [name],  MD;  Location: [facility] ORS;  Service: Urology;  Laterality: Right;  ? CYSTOSCOPY WITH BIOPSY Right 09/12/2015  ? Procedure: CYSTOSCOPY WITH BLADDER AND URETERAL BIOPSY;  Surgeon: [name], MD;  Location: [facility] ORS;  Service: Urology;  Laterality: Right;  ? CYSTOSCOPY WITH STENT PLACEMENT Right 01/30/2015  ? Procedure: CYSTOSCOPY WITH STENT PLACEMENT;  Surgeon: [name], MD;  Location: [facility] ORS;  Service: Urology;  Laterality: Right;  ? CYSTOSCOPY WITH STENT PLACEMENT Right  12/28/2017  ? Procedure: CYSTOSCOPY WITH STENT Exchange;  Surgeon: [name], MD;  Location: [facility] ORS;  Service: Urology;  Laterality: Right;  ? CYSTOSCOPY/URETEROSCOPY/HOLMIUM LASER/STENT PLACEMENT Right 08/19/2017  ? Procedure: CYSTOSCOPY/URETEROSCOPY/HOLMIUM LASER/STENT PLACEMENT;  Surgeon: [name], MD;  Location: [facility] ORS;  Service: Urology;  Laterality: Right;  ? EYE SURGERY Bilateral   ? Cataract Extraction with IOL  ? goiter removal    ? HOLMIUM LASER APPLICATION N/A 1/61/0960  ? Procedure:  HOLMIUM LASER APPLICATION;  Surgeon: [name], MD;  Location: [facility] ORS;  Service: Urology;  Laterality: N/A;  ? PORTA CATH INSERTION N/A 09/23/2017  ? Procedure: PORTA CATH INSERTION;  Surgeon: Algernon Huxley, MD;  Location: [place] [facility] CV LAB;  Service: Cardiovascular;  Laterality: N/A;  ? SPERMATOCELECTOMY    ? TONSILLECTOMY    ? TRANSURETHRAL RESECTION OF BLADDER TUMOR N/A 02/26/2016  ? Procedure: TRANSURETHRAL RESECTION OF BLADDER TUMOR (TURBT);  Surgeon: [name], MD;  Location: [facility] ORS;  Service: Urology;  Laterality: N/A;  ? TRANSURETHRAL RESECTION OF BLADDER TUMOR WITH MITOMYCIN-C N/A 09/12/2015  ? Procedure: TRANSURETHRAL RESECTION OF BLADDER TUMOR ;  Surgeon: [name], MD;  Location: [facility];  Service: Urology;  Laterality: N/A;  ? TRANSURETHRAL RESECTION OF BLADDER TUMOR WITH MITOMYCIN-C N/A 03/24/2016  ? Procedure: TRANSURETHRAL RESECTION OF BLADDER TUMOR WITH MITOMYCIN-C  (SMALL);  Surgeon: [name], MD;  Location: [facility];  Service: Urology;  Laterality: N/A;  ? URETERAL BIOPSY Right 08/19/2017  ? Procedure: Renal Mass BIOPSY;  Surgeon: [name], MD;  Location: [facility] OR[facility];  Service: Urology;  Laterality: Right;  ? URETEROSCOPY Right 01/30/2015  ? Procedure: URETEROSCOPY/ WITH BIOPSY AND CYTOLOGY BRUSHING;  Surgeon: [name], MD;  Location: [facility];  Service: Urology;  Laterality: Right;  ? URETEROSCOPY Right 08/20/2015  ? Procedure: URETEROSCOPY;  Surgeon: [name], MD;  Location: [facility];  Service: Urology;  Laterality: Right;  ? URETEROSCOPY Right 09/12/2015  ? Procedure: URETEROSCOPY;  Surgeon: [name], MD;  Location: [facility] OR[facility];  Service: Urology;  Laterality: Right;  ? URETEROSCOPY Right 02/26/2016  ? Procedure: URETEROSCOPY;  Surgeon: [name], MD;  Location: [facility] ORS;  Service: Urology;  Laterality: Right;  ? ? ? ?Home Medications  ? ?Prior to Admission medications   ?Medication Sig Start Date End Date Taking? Authorizing Provider  ?clopidogrel (PLAVIX) 75 MG tablet Take 1 tablet (75 mg total) by mouth daily. 07/12/21 08/11/21 Yes Paulette Blanch, MD  ?acetaminophen (TYLENOL) 500 MG tablet Take 500 mg by mouth every 6 (six) hours as needed.    [provider]  ?camphor-menthol Timoteo Ace) lotion Apply 1 application topically as needed for itching.    [provider]  ?diazepam (VALIUM) 5 MG tablet TAKE 1 TABLET (5 MG TOTAL) BY MOUTH EVERY 12 (TWELVE) HOURS AS NEEDED FOR ANXIETY OR SLEEP 02/23/18   [provider]  ?docusate sodium (COLACE) 250 MG capsule Take 250 mg by mouth daily.    [provider]  ?fenofibrate micronized (LOFIBRA) 134 MG capsule Take 134 mg by  mouth daily before breakfast.  04/08/14   [provider]  ?gemfibrozil (LOPID) 600 MG tablet Take 600 mg by mouth 2 (two) times daily before a meal. 03/12/21 03/12/22  [provider]  ?HYDROcodone-acetaminophen (NORCO/VICODIN) 5-325 MG tablet Take 1 tablet by mouth every 6 (six) hours as needed for severe pain. 02/03/18   Verlon Au, NP  ?Multiple Vitamin (MULTIVITAMIN WITH MINERALS) TABS tablet Take 1 tablet by mouth daily.    [provider]  ?nystatin (MYCOSTATIN/NYSTOP) powder Apply 1 application topically 3 (three) times daily. To under arms area that has rash 01/14/21   Sindy Guadeloupe, MD  ?polyethylene glycol (MIRALAX / GLYCOLAX) 17 g packet Take 17 g by mouth daily.    [provider]  ?prochlorperazine (COMPAZINE) 10 MG  tablet Take 1 tablet (10 mg total) by mouth every 6 (six) hours as needed for nausea or vomiting. ?Patient not taking: Reported on 07/01/2021 01/14/21   Sindy Guadeloupe, MD  ?psyllium (METAMUCIL) 58.6 % packet Take

## 2021-07-13 NOTE — ED Notes (Signed)
Patient attempted to [facility] form but was unable to due to equipment malfunction. Patient and spouse verbalized understanding of potential risk of leaving AMA ?

## 2021-07-15 ENCOUNTER — Other Ambulatory Visit: Payer: Self-pay | Admitting: *Deleted

## 2021-07-15 ENCOUNTER — Ambulatory Visit: Payer: PPO

## 2021-07-15 ENCOUNTER — Ambulatory Visit
Admission: RE | Admit: 2021-07-15 | Discharge: 2021-07-15 | Disposition: A | Discharge status: Home / Self Care | Payer: PPO | Source: Ambulatory Visit | Attending: Oncology | Admitting: Oncology

## 2021-07-15 ENCOUNTER — Other Ambulatory Visit: Payer: Self-pay

## 2021-07-15 DIAGNOSIS — I700 Atherosclerosis of aorta: Secondary | ICD-10-CM | POA: Insufficient documentation

## 2021-07-15 DIAGNOSIS — C7800 Secondary malignant neoplasm of unspecified lung: Secondary | ICD-10-CM | POA: Diagnosis not present

## 2021-07-15 DIAGNOSIS — G529 Cranial nerve disorder, unspecified: Secondary | ICD-10-CM

## 2021-07-15 DIAGNOSIS — I2510 Atherosclerotic heart disease of native coronary artery without angina pectoris: Secondary | ICD-10-CM | POA: Insufficient documentation

## 2021-07-15 DIAGNOSIS — R9089 Other abnormal findings on diagnostic imaging of central nervous system: Secondary | ICD-10-CM

## 2021-07-15 DIAGNOSIS — I639 Cerebral infarction, unspecified: Secondary | ICD-10-CM

## 2021-07-15 DIAGNOSIS — R918 Other nonspecific abnormal finding of lung field: Secondary | ICD-10-CM | POA: Diagnosis not present

## 2021-07-15 DIAGNOSIS — C669 Malignant neoplasm of unspecified ureter: Secondary | ICD-10-CM | POA: Insufficient documentation

## 2021-07-15 MED ORDER — IOHEXOL 300 MG/ML  SOLN
75.0000 mL | Freq: Once | INTRAMUSCULAR | Status: AC | PRN
  Administered 2021-07-15: 75 mL via INTRAVENOUS

## 2021-07-15 NOTE — Progress Notes (Signed)
Amb ref to ENT ?

## 2021-07-18 DIAGNOSIS — G3184 Mild cognitive impairment, so stated: Secondary | ICD-10-CM | POA: Diagnosis not present

## 2021-07-18 DIAGNOSIS — Z7982 Long term (current) use of aspirin: Secondary | ICD-10-CM | POA: Diagnosis not present

## 2021-07-18 DIAGNOSIS — C7982 Secondary malignant neoplasm of genital organs: Secondary | ICD-10-CM | POA: Diagnosis not present

## 2021-07-18 DIAGNOSIS — C7801 Secondary malignant neoplasm of right lung: Secondary | ICD-10-CM | POA: Diagnosis not present

## 2021-07-18 DIAGNOSIS — I69398 Other sequelae of cerebral infarction: Secondary | ICD-10-CM | POA: Diagnosis not present

## 2021-07-18 DIAGNOSIS — Z9221 Personal history of antineoplastic chemotherapy: Secondary | ICD-10-CM | POA: Diagnosis not present

## 2021-07-18 DIAGNOSIS — I639 Cerebral infarction, unspecified: Secondary | ICD-10-CM | POA: Diagnosis not present

## 2021-07-18 DIAGNOSIS — C679 Malignant neoplasm of bladder, unspecified: Secondary | ICD-10-CM | POA: Diagnosis not present

## 2021-07-18 DIAGNOSIS — L270 Generalized skin eruption due to drugs and medicaments taken internally: Secondary | ICD-10-CM | POA: Diagnosis not present

## 2021-07-18 DIAGNOSIS — D333 Benign neoplasm of cranial nerves: Secondary | ICD-10-CM | POA: Diagnosis not present

## 2021-07-18 DIAGNOSIS — C7910 Secondary malignant neoplasm of unspecified urinary organs: Secondary | ICD-10-CM | POA: Diagnosis not present

## 2021-07-18 DIAGNOSIS — Z7902 Long term (current) use of antithrombotics/antiplatelets: Secondary | ICD-10-CM | POA: Diagnosis not present

## 2021-07-18 DIAGNOSIS — C7900 Secondary malignant neoplasm of unspecified kidney and renal pelvis: Secondary | ICD-10-CM | POA: Diagnosis not present

## 2021-07-18 DIAGNOSIS — G939 Disorder of brain, unspecified: Secondary | ICD-10-CM | POA: Diagnosis not present

## 2021-07-19 MED FILL — Dexamethasone Sodium Phosphate Inj 100 MG/10ML: INTRAMUSCULAR | Qty: 1 | Status: AC

## 2021-07-22 ENCOUNTER — Inpatient Hospital Stay (HOSPITAL_BASED_OUTPATIENT_CLINIC_OR_DEPARTMENT_OTHER): Payer: PPO | Admitting: Oncology

## 2021-07-22 ENCOUNTER — Other Ambulatory Visit: Payer: Self-pay

## 2021-07-22 ENCOUNTER — Telehealth: Payer: Self-pay | Admitting: *Deleted

## 2021-07-22 ENCOUNTER — Inpatient Hospital Stay: Payer: PPO

## 2021-07-22 ENCOUNTER — Encounter: Payer: Self-pay | Admitting: Oncology

## 2021-07-22 VITALS — BP 165/77 | HR 58 | Resp 16

## 2021-07-22 VITALS — BP 144/77 | HR 79 | Temp 97.1°F | Resp 18 | Wt 187.7 lb

## 2021-07-22 DIAGNOSIS — Z5112 Encounter for antineoplastic immunotherapy: Secondary | ICD-10-CM | POA: Diagnosis not present

## 2021-07-22 DIAGNOSIS — C669 Malignant neoplasm of unspecified ureter: Secondary | ICD-10-CM

## 2021-07-22 DIAGNOSIS — Z5111 Encounter for antineoplastic chemotherapy: Secondary | ICD-10-CM

## 2021-07-22 LAB — COMPREHENSIVE METABOLIC PANEL
ALT: 27 U/L (ref 0–44)
AST: 35 U/L (ref 15–41)
Albumin: 3.6 g/dL (ref 3.5–5.0)
Alkaline Phosphatase: 65 U/L (ref 38–126)
Anion gap: 6 (ref 5–15)
BUN: 20 mg/dL (ref 8–23)
CO2: 25 mmol/L (ref 22–32)
Calcium: 8.4 mg/dL — ABNORMAL LOW (ref 8.9–10.3)
Chloride: 103 mmol/L (ref 98–111)
Creatinine, Ser: 0.93 mg/dL (ref 0.61–1.24)
GFR, Estimated: >60 mL/min (ref >60)
Glucose, Bld: 121 mg/dL — ABNORMAL HIGH (ref 70–99)
Potassium: 4 mmol/L (ref 3.5–5.1)
Sodium: 134 mmol/L — ABNORMAL LOW (ref 135–145)
Total Bilirubin: 0.6 mg/dL (ref 0.3–1.2)
Total Protein: 6.7 g/dL (ref 6.5–8.1)

## 2021-07-22 LAB — CBC WITH DIFFERENTIAL/PLATELET
Abs Immature Granulocytes: 0.02 10*3/uL (ref 0.00–0.07)
Basophils Absolute: 0.1 10*3/uL (ref 0.0–0.1)
Basophils Relative: 1 %
Eosinophils Absolute: 0.1 10*3/uL (ref 0.0–0.5)
Eosinophils Relative: 2 %
HCT: 41.5 % (ref 39.0–52.0)
Hemoglobin: 14.1 g/dL (ref 13.0–17.0)
Immature Granulocytes: 0 %
Lymphocytes Relative: 32 %
Lymphs Abs: 1.7 10*3/uL (ref 0.7–4.0)
MCH: 29.9 pg (ref 26.0–34.0)
MCHC: 34 g/dL (ref 30.0–36.0)
MCV: 87.9 fL (ref 80.0–100.0)
Monocytes Absolute: 0.5 10*3/uL (ref 0.1–1.0)
Monocytes Relative: 10 %
Neutro Abs: 2.9 10*3/uL (ref 1.7–7.7)
Neutrophils Relative %: 55 %
Platelets: 298 10*3/uL (ref 150–400)
RBC: 4.72 MIL/uL (ref 4.22–5.81)
RDW: 15.9 % — ABNORMAL HIGH (ref 11.5–15.5)
WBC: 5.2 10*3/uL (ref 4.0–10.5)
nRBC: 0 % (ref 0.0–0.2)

## 2021-07-22 MED ORDER — SODIUM CHLORIDE 0.9 % IV SOLN
10.0000 mg | Freq: Once | INTRAVENOUS | Status: AC
  Administered 2021-07-22: 10 mg via INTRAVENOUS
  Filled 2021-07-22: qty 10

## 2021-07-22 MED ORDER — HEPARIN SOD (PORK) LOCK FLUSH 100 UNIT/ML IV SOLN
500.0000 [IU] | Freq: Once | INTRAVENOUS | Status: AC | PRN
  Administered 2021-07-22: 500 [IU]
  Filled 2021-07-22: qty 5

## 2021-07-22 MED ORDER — PALONOSETRON HCL INJECTION 0.25 MG/5ML
0.2500 mg | Freq: Once | INTRAVENOUS | Status: AC
  Administered 2021-07-22: 0.25 mg via INTRAVENOUS
  Filled 2021-07-22: qty 5

## 2021-07-22 MED ORDER — SODIUM CHLORIDE 0.9 % IV SOLN
Freq: Once | INTRAVENOUS | Status: AC
  Filled 2021-07-22: qty 250

## 2021-07-22 MED ORDER — SODIUM CHLORIDE 0.9% FLUSH
10.0000 mL | INTRAVENOUS | Status: DC | PRN
  Filled 2021-07-22: qty 10

## 2021-07-22 MED ORDER — SODIUM CHLORIDE 0.9 % IV SOLN
90.0000 mg | Freq: Once | INTRAVENOUS | Status: AC
  Administered 2021-07-22: 90 mg via INTRAVENOUS
  Filled 2021-07-22: qty 9

## 2021-07-22 MED ORDER — CLOPIDOGREL BISULFATE 75 MG PO TABS: 75.0000 mg | ORAL_TABLET | Freq: Every day | ORAL | 0 refills | Status: AC

## 2021-07-22 NOTE — Telephone Encounter (Signed)
Called the neurology office and asked if pt suppose to continue the plavix and the baby asa until neurology say so. Also he has vestible right schwannoma and we put in referral for ENT and [facility] does nto deal with that and suggested that he sees ENT at [facility]. In the notes from dr. Manuella Ghazi it indicated they will monitor it. Wanted to check to make sure this is correct info and then I do not need to do ref. Spoke to [employer] and she will give the message to dr Bobbye Riggs and let us know. ?

## 2021-07-22 NOTE — Patient Instructions (Signed)
[facility] CANCER CTR AT [place]  Discharge Instructions: ?Thank you for choosing [facility] to provide your oncology and hematology care.  ?If you have a lab appointment with the [facility], please go directly to the [facility] and check in at the registration area. ? ?Wear comfortable clothing and clothing appropriate for easy access to any Portacath or PICC line.  ? ?We strive to give you quality time with your provider. You may need to reschedule your appointment if you arrive late (15 or more minutes).  Arriving late affects you and other patients whose appointments are after yours.  Also, if you miss three or more appointments without notifying the office, you may be dismissed from the clinic at the provider?s discretion.    ?  ?For prescription refill requests, have your pharmacy contact our office and allow 72 hours for refills to be completed.   ? ?Today you received the following chemotherapy and/or immunotherapy agents Padcev    ?  ?To help prevent nausea and vomiting after your treatment, we encourage you to take your nausea medication as directed. ? ?BELOW ARE SYMPTOMS THAT SHOULD BE REPORTED IMMEDIATELY: ?*FEVER GREATER THAN 100.4 F (38 ?C) OR HIGHER ?*CHILLS OR SWEATING ?*NAUSEA AND VOMITING THAT IS NOT CONTROLLED WITH YOUR NAUSEA MEDICATION ?*UNUSUAL SHORTNESS OF BREATH ?*UNUSUAL BRUISING OR BLEEDING ?*URINARY PROBLEMS (pain or burning when urinating, or frequent urination) ?*BOWEL PROBLEMS (unusual diarrhea, constipation, pain near the anus) ?TENDERNESS IN MOUTH AND THROAT WITH OR WITHOUT PRESENCE OF ULCERS (sore throat, sores in mouth, or a toothache) ?UNUSUAL RASH, SWELLING OR PAIN  ?UNUSUAL VAGINAL DISCHARGE OR ITCHING  ? ?Items with * indicate a potential emergency and should be followed up as soon as possible or go to the Emergency Department if any problems should occur. ? ?Please show the CHEMOTHERAPY ALERT CARD or IMMUNOTHERAPY ALERT CARD at check-in to the  Emergency Department and triage nurse. ? ?Should you have questions after your visit or need to cancel or reschedule your appointment, please contact [facility] CANCER [place] AT [place]  [phone] and follow the prompts.  Office hours are 8:00 a.m. to 4:30 p.m. Monday - Friday. Please note that voicemails left after 4:00 p.m. may not be returned until the following business day.  We are closed weekends and major holidays. You have access to a nurse at all times for urgent questions. Please call the main number to the clinic [phone] and follow the prompts. ? ?For any non-urgent questions, you may also contact your provider using MyChart. We now offer e-Visits for anyone 86 and older to request care online for non-urgent symptoms. For details visit [email or web address]. ?  ?Also download the MyChart app! Go to the app store, search "MyChart", open the app, select [place], and log in with your MyChart username and password. ? ?Due to Covid, a mask is required upon entering the hospital/clinic. If you do not have a mask, one will be given to you upon arrival. For doctor visits, patients may have 1 support person aged 50 or older with them. For treatment visits, patients cannot have anyone with them due to current Covid guidelines and our immunocompromised population.  ?

## 2021-07-22 NOTE — Progress Notes (Signed)
? ? ? ?Hematology/Oncology Consult note ?[place]  ?[phone] [phone] ? ?Patient Care Team: ?Idelle Crouch, MD as PCP - General (Internal Medicine) ?Sindy Guadeloupe, MD as Consulting Physician (Hematology and Oncology) ?Sindy Guadeloupe, MD as Consulting Physician (Hematology and Oncology) ?Sindy Guadeloupe, MD as Consulting Physician (Hematology and Oncology)  ? ?Name of the patient: Anthony Gonzales  ?[id number]  ?1931-06-03  ? ?Date of visit: 07/22/21 ? ?Diagnosis- Metastatic urothelial carcinoma with possible mets to the obturator node. Indeterminate hilar lymph nodes and LUL lung nodule ?  ? ?Chief complaint/ Reason for visit-discuss CT scan results and on treatment assessment prior to cycle 14-day 1 of Padcev ? ?Heme/Onc history: patient is a 88 year old male with past medical history significant for hypertension and long-standing history of superficial bladder cancer for which he sees [name].  He has undergone TURBT as well as ureteroscopy since 2017 along with mitomycin as well in the past.  Most recently he underwent CT abdomen on 08/06/2017 which showed a soft tissue mass in the right renal pelvis concerning for upper tract urothelial neoplasm.  Soft tissue fullness at the right ureterovesical junction with proximal right hydroureteronephrosis.  Several millimeter attenuation lesion in the pancreatic head. ?  ?He underwent diagnostic ureteroscopy and was found to have 2 high-grade lesions within his right kidney.  There was a nodular high-grade appearing lesion in the right anterior renal pelvis.  There was also a second ureteral tumor fungating from the right ureteral orifice extending into the distal ureter also consistent with high-grade invasive urothelial carcinoma.  Muscle invasion could not be assessed.  He also underwent a CT chest which showed a rounded nodule in the right upper lobe measuring 14 mm concerning for metastases.  2 other 3 mm  lesions were also noted in the left upper lobe likely benign ?  ?Plan initially was neoadjuvant chemotherapy followed by possible surgery but given the presence of lung lesion patient has been referred to oncology for the same. ?  ?PET/CT on 09/21/17 showed: IMPRESSION: ?1. Hypermetabolic right obturator lymph node is most indicative of ?metastatic disease. ?2. Mildly hypermetabolic left hilar lymph nodes are nonspecific. ?Continued attention on follow-up exams is warranted. ?3. Right upper lobe pulmonary nodule shows metabolism after just ?above blood pool and is therefore indeterminate. Continued attention ?on follow-up exams is warranted. ?4. Aortic atherosclerosis (ICD10-170.0). Coronary artery ?calcification. ?5. Cholelithiasis ?  ? Carboplatin/ gemzar 1 week on and one-week off as patient could not tolerate 2-week on and one week off regimen.  Cycle 1 started on 09/22/2017 ?Disease progression in March 2020.  Switched to second line Keytruda ?  ?Patient had disease controlled with Keytruda.  However he was noted to have worsening dermatitis with constant flareups requiring steroids.  Beryle Flock is therefore being kept on hold and patient will be switched to third line Padcev.    Treatment has been on hold since February 2021 due to constant flareup of skin rash ?  ?Scans in October 2022 showed disease progression with new lung nodules as well as pelvic sidewall adenopathy.  Padcev restarted in November 2022 ? ?Interval history-patient was complaining of some blurred vision and was seen at [facility] eye care.  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  This led to an MRI brain which incidentally showedAcute/subacute infarct 9 mm in the left aspect of callosal splenium.  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  Patient has seen neurology for this and is presently on aspirin and Plavix. ? ?He has ongoing erythematous rash in his bilateral arms which  has been waxing and waning and he sees Dr. Evorn Gong from dermatology for this.  Currently he is only using topical emollient creams ? ?ECOG PS- 1 ?Pain scale- 0 ? ? ?Review of systems- Review of Systems  ?Constitutional:  Negative for chills, fever, malaise/fatigue and weight loss.  ?HENT:  Negative for congestion, ear discharge and nosebleeds.   ?Eyes:  Negative for blurred vision.  ?Respiratory:  Negative for cough, hemoptysis, sputum production, shortness of breath and wheezing.   ?Cardiovascular:  Negative for chest pain, palpitations, orthopnea and claudication.  ?Gastrointestinal:  Negative for abdominal pain, blood in stool, constipation, diarrhea, heartburn, melena, nausea and vomiting.  ?Genitourinary:  Negative for dysuria, flank pain, frequency, hematuria and urgency.  ?Musculoskeletal:  Negative for back pain, joint pain and myalgias.  ?Skin:  Positive for rash.  ?Neurological:  Negative for dizziness, tingling, focal weakness, seizures, weakness and headaches.  ?Endo/Heme/Allergies:  Does not bruise/bleed easily.  ?Psychiatric/Behavioral:  Negative for depression and suicidal ideas. The patient does not have insomnia.    ? ? ? ?Allergies  ?Allergen Reactions  ? Demerol [Meperidine] Nausea And Vomiting  ? Lipitor [Atorvastatin] Swelling  ? Sulfa Antibiotics Nausea And Vomiting and Rash  ? ? ? ?Past Medical History:  ?Diagnosis Date  ? Arthritis   ? Benign fibroma of prostate 08/23/2013  ? BPH (benign prostatic hyperplasia)   ? Calculus of kidney 08/23/2013  ? Glaucoma   ? no drops in 3 mo pressure good, pt denies glaucoma, eye pressure has been measuring alright.  ? History of kidney stones   ? HLD (hyperlipidemia)   ? HOH (hard of hearing)   ? Left Hearing Aid  ? HTN (hypertension) 12/26/2014  ? Hypertension   ? Hyponatremia 12/26/2014  ? Migraines   ? history of migraines when he was younger.  ? Restless leg syndrome   ? Sinus drainage   ? Skin cancer   ? Skin cancer   ? left hand 06/2019  ? Urothelial  cancer ([place])   ? chemo tx's.  ? UTI (lower urinary tract infection) 12/26/2014  ? Vertigo   ? ? ? ?Past Surgical History:  ?Procedure Laterality Date  ? COLONOSCOPY    ? CYSTOSCOPY W/ RETROGRADES Right 01/30/2015  ? Procedure: CYSTOSCOPY WITH RETROGRADE PYELOGRAM;  Surgeon: [name], MD;  Location: [facility];  Service: Urology;  Laterality: Right;  ? CYSTOSCOPY W/ RETROGRADES Bilateral 02/26/2016  ? Procedure: CYSTOSCOPY WITH RETROGRADE PYELOGRAM;  Surgeon: [name], MD;  Location: [facility];  Service: Urology;  Laterality: Bilateral;  ? CYSTOSCOPY W/ URETERAL STENT PLACEMENT Right 08/20/2015  ? Procedure: CYSTOSCOPY WITH RETROGRADE PYELOGRAM/POSSIBLE URETERAL STENT PLACEMENT/BLADDER BIOPSY;  Surgeon: [name], MD;  Location: [facility];  Service: Urology;  Laterality: Right;  ? CYSTOSCOPY W/ URETERAL STENT PLACEMENT Right 09/12/2015  ? Procedure: CYSTOSCOPY WITH STENT REPLACEMENT;  Surgeon: [name], MD;  Location: [facility];  Service: Urology;  Laterality: Right;  ? CYSTOSCOPY WITH BIOPSY Right 09/12/2015  ? Procedure: CYSTOSCOPY WITH BLADDER AND URETERAL BIOPSY;  Surgeon: [name], MD;  Location: [facility];  Service: Urology;  Laterality: Right;  ? CYSTOSCOPY WITH STENT PLACEMENT Right 01/30/2015  ? Procedure: CYSTOSCOPY WITH STENT PLACEMENT;  Surgeon: [name], MD;  Location: [facility];  Service: Urology;  Laterality: Right;  ? CYSTOSCOPY WITH STENT PLACEMENT Right 12/28/2017  ? Procedure: CYSTOSCOPY WITH STENT  Exchange;  Surgeon: [name], MD;  Location: [facility];  Service: Urology;  Laterality: Right;  ? CYSTOSCOPY/URETEROSCOPY/HOLMIUM LASER/STENT PLACEMENT Right 08/19/2017  ? Procedure: CYSTOSCOPY/URETEROSCOPY/HOLMIUM LASER/STENT PLACEMENT;  Surgeon: [name], MD;  Location: [facility];  Service: Urology;  Laterality: Right;  ? EYE SURGERY Bilateral   ? Cataract Extraction with IOL  ? goiter removal    ? HOLMIUM LASER APPLICATION N/A 2/92/4462  ? Procedure:  HOLMIUM LASER  APPLICATION;  Surgeon: [name], MD;  Location: [facility];  Service: Urology;  Laterality: N/A;  ? PORTA CATH INSERTION N/A 09/23/2017  ? Procedure: PORTA CATH INSERTION;  Surgeon: Algernon Huxley, MD;  Location

## 2021-07-23 ENCOUNTER — Telehealth: Payer: Self-pay | Admitting: *Deleted

## 2021-07-23 NOTE — Telephone Encounter (Signed)
I called this am about if Dr. Manuella Ghazi was going to manage his plavix and baby asa. Also Dr. Janese Banks wanted [place] to make an appt at [facility] for the vestibular schwannoma and I had read the notes from dr Manuella Ghazi and it looked like he will monitor this. When Wells Guiles called back about my message, I was told that dr. Manuella Ghazi will manage his Plavix and 81 mg . Also because he is not a candidate for any surgery he will monitor the patient for the schwannoma. ?

## 2021-07-26 MED FILL — Dexamethasone Sodium Phosphate Inj 100 MG/10ML: INTRAMUSCULAR | Qty: 1 | Status: AC

## 2021-07-29 ENCOUNTER — Inpatient Hospital Stay: Payer: PPO

## 2021-07-29 ENCOUNTER — Other Ambulatory Visit: Payer: Self-pay | Admitting: Oncology

## 2021-07-29 VITALS — BP 138/64 | HR 64 | Temp 97.1°F | Resp 20

## 2021-07-29 DIAGNOSIS — C669 Malignant neoplasm of unspecified ureter: Secondary | ICD-10-CM

## 2021-07-29 DIAGNOSIS — Z5112 Encounter for antineoplastic immunotherapy: Secondary | ICD-10-CM | POA: Diagnosis not present

## 2021-07-29 LAB — CBC WITH DIFFERENTIAL/PLATELET
Abs Immature Granulocytes: 0.02 10*3/uL (ref 0.00–0.07)
Basophils Absolute: 0.1 10*3/uL (ref 0.0–0.1)
Basophils Relative: 1 %
Eosinophils Absolute: 0.1 10*3/uL (ref 0.0–0.5)
Eosinophils Relative: 1 %
HCT: 39.1 % (ref 39.0–52.0)
Hemoglobin: 13.4 g/dL (ref 13.0–17.0)
Immature Granulocytes: 0 %
Lymphocytes Relative: 30 %
Lymphs Abs: 1.8 10*3/uL (ref 0.7–4.0)
MCH: 30.2 pg (ref 26.0–34.0)
MCHC: 34.3 g/dL (ref 30.0–36.0)
MCV: 88.3 fL (ref 80.0–100.0)
Monocytes Absolute: 1 10*3/uL (ref 0.1–1.0)
Monocytes Relative: 16 %
Neutro Abs: 3.1 10*3/uL (ref 1.7–7.7)
Neutrophils Relative %: 52 %
Platelets: 267 10*3/uL (ref 150–400)
RBC: 4.43 MIL/uL (ref 4.22–5.81)
RDW: 15.9 % — ABNORMAL HIGH (ref 11.5–15.5)
WBC: 6 10*3/uL (ref 4.0–10.5)
nRBC: 0 % (ref 0.0–0.2)

## 2021-07-29 LAB — COMPREHENSIVE METABOLIC PANEL
ALT: 28 U/L (ref 0–44)
AST: 36 U/L (ref 15–41)
Albumin: 3.5 g/dL (ref 3.5–5.0)
Alkaline Phosphatase: 59 U/L (ref 38–126)
Anion gap: 6 (ref 5–15)
BUN: 19 mg/dL (ref 8–23)
CO2: 27 mmol/L (ref 22–32)
Calcium: 8.2 mg/dL — ABNORMAL LOW (ref 8.9–10.3)
Chloride: 101 mmol/L (ref 98–111)
Creatinine, Ser: 0.9 mg/dL (ref 0.61–1.24)
GFR, Estimated: >60 mL/min (ref >60)
Glucose, Bld: 107 mg/dL — ABNORMAL HIGH (ref 70–99)
Potassium: 3.9 mmol/L (ref 3.5–5.1)
Sodium: 134 mmol/L — ABNORMAL LOW (ref 135–145)
Total Bilirubin: 0.7 mg/dL (ref 0.3–1.2)
Total Protein: 6.4 g/dL — ABNORMAL LOW (ref 6.5–8.1)

## 2021-07-29 MED ORDER — SODIUM CHLORIDE 0.9 % IV SOLN
60.0000 mg | Freq: Once | INTRAVENOUS | Status: AC
  Administered 2021-07-29: 60 mg via INTRAVENOUS
  Filled 2021-07-29: qty 6

## 2021-07-29 MED ORDER — PALONOSETRON HCL INJECTION 0.25 MG/5ML
0.2500 mg | Freq: Once | INTRAVENOUS | Status: AC
  Administered 2021-07-29: 0.25 mg via INTRAVENOUS
  Filled 2021-07-29: qty 5

## 2021-07-29 MED ORDER — SODIUM CHLORIDE 0.9 % IV SOLN
Freq: Once | INTRAVENOUS | Status: AC
  Filled 2021-07-29: qty 250

## 2021-07-29 MED ORDER — SODIUM CHLORIDE 0.9 % IV SOLN
10.0000 mg | Freq: Once | INTRAVENOUS | Status: AC
  Administered 2021-07-29: 10 mg via INTRAVENOUS
  Filled 2021-07-29: qty 10

## 2021-07-29 MED ORDER — HEPARIN SOD (PORK) LOCK FLUSH 100 UNIT/ML IV SOLN
500.0000 [IU] | Freq: Once | INTRAVENOUS | Status: AC | PRN
  Administered 2021-07-29: 500 [IU]
  Filled 2021-07-29: qty 5

## 2021-07-29 MED ORDER — SODIUM CHLORIDE 0.9 % IV SOLN
90.0000 mg | Freq: Once | INTRAVENOUS | Status: DC
  Filled 2021-07-29: qty 9

## 2021-07-29 NOTE — Patient Instructions (Signed)
[facility] [place]  Discharge Instructions: Thank you for choosing [facility] to provide your oncology and hematology care.  If you have a lab appointment with the [facility], please go directly to the [facility] and check in at the registration area.  Wear comfortable clothing and clothing appropriate for easy access to any Portacath or PICC line.   We strive to give you quality time with your provider. You may need to reschedule your appointment if you arrive late (15 or more minutes).  Arriving late affects you and other patients whose appointments are after yours.  Also, if you miss three or more appointments without notifying the office, you may be dismissed from the clinic at the provider's discretion.      For prescription refill requests, have your pharmacy contact our office and allow 72 hours for refills to be completed.    Today you received the following chemotherapy and/or immunotherapy agents: Padcev      To help prevent nausea and vomiting after your treatment, we encourage you to take your nausea medication as directed.  BELOW ARE SYMPTOMS THAT SHOULD BE REPORTED IMMEDIATELY: *FEVER GREATER THAN 100.4 F (38 C) OR HIGHER *CHILLS OR SWEATING *NAUSEA AND VOMITING THAT IS NOT CONTROLLED WITH YOUR NAUSEA MEDICATION *UNUSUAL SHORTNESS OF BREATH *UNUSUAL BRUISING OR BLEEDING *URINARY PROBLEMS (pain or burning when urinating, or frequent urination) *BOWEL PROBLEMS (unusual diarrhea, constipation, pain near the anus) TENDERNESS IN MOUTH AND THROAT WITH OR WITHOUT PRESENCE OF ULCERS (sore throat, sores in mouth, or a toothache) UNUSUAL RASH, SWELLING OR PAIN  UNUSUAL VAGINAL DISCHARGE OR ITCHING   Items with * indicate a potential emergency and should be followed up as soon as possible or go to the Emergency Department if any problems should occur.  Please show the CHEMOTHERAPY ALERT CARD or IMMUNOTHERAPY ALERT CARD at check-in to the  Emergency Department and triage nurse.  Should you have questions after your visit or need to cancel or reschedule your appointment, please contact [facility] [place] [facility] [place]  [phone] and follow the prompts.  Office hours are 8:00 a.m. to 4:30 p.m. Monday - Friday. Please note that voicemails left after 4:00 p.m. may not be returned until the following business day.  We are closed weekends and major holidays. You have access to a nurse at all times for urgent questions. Please call the main number to the clinic [phone] and follow the prompts.  For any non-urgent questions, you may also contact your provider using MyChart. We now offer e-Visits for anyone 75 and older to request care online for non-urgent symptoms. For details visit [email or web address].   Also download the MyChart app! Go to the app store, search "MyChart", open the app, select [facility], and log in with your MyChart username and password.  Due to Covid, a mask is required upon entering the hospital/clinic. If you do not have a mask, one will be given to you upon arrival. For doctor visits, patients may have 1 support person aged 93 or older with them. For treatment visits, patients cannot have anyone with them due to current Covid guidelines and our immunocompromised population.

## 2021-07-31 DIAGNOSIS — L538 Other specified erythematous conditions: Secondary | ICD-10-CM | POA: Diagnosis not present

## 2021-07-31 DIAGNOSIS — D485 Neoplasm of uncertain behavior of skin: Secondary | ICD-10-CM | POA: Diagnosis not present

## 2021-07-31 DIAGNOSIS — I639 Cerebral infarction, unspecified: Secondary | ICD-10-CM | POA: Insufficient documentation

## 2021-07-31 DIAGNOSIS — I6300 Cerebral infarction due to thrombosis of unspecified precerebral artery: Secondary | ICD-10-CM | POA: Diagnosis not present

## 2021-07-31 DIAGNOSIS — R208 Other disturbances of skin sensation: Secondary | ICD-10-CM | POA: Diagnosis not present

## 2021-07-31 DIAGNOSIS — D045 Carcinoma in situ of skin of trunk: Secondary | ICD-10-CM | POA: Diagnosis not present

## 2021-08-08 ENCOUNTER — Inpatient Hospital Stay: Payer: PPO | Attending: Oncology | Admitting: Hospice and Palliative Medicine

## 2021-08-08 ENCOUNTER — Telehealth: Payer: Self-pay | Admitting: *Deleted

## 2021-08-08 VITALS — BP 146/90 | HR 80 | Temp 97.7°F | Resp 16

## 2021-08-08 DIAGNOSIS — K8020 Calculus of gallbladder without cholecystitis without obstruction: Secondary | ICD-10-CM | POA: Insufficient documentation

## 2021-08-08 DIAGNOSIS — Z5112 Encounter for antineoplastic immunotherapy: Secondary | ICD-10-CM | POA: Diagnosis present

## 2021-08-08 DIAGNOSIS — Z87442 Personal history of urinary calculi: Secondary | ICD-10-CM | POA: Diagnosis not present

## 2021-08-08 DIAGNOSIS — H538 Other visual disturbances: Secondary | ICD-10-CM | POA: Insufficient documentation

## 2021-08-08 DIAGNOSIS — I700 Atherosclerosis of aorta: Secondary | ICD-10-CM | POA: Insufficient documentation

## 2021-08-08 DIAGNOSIS — Z87440 Personal history of urinary (tract) infections: Secondary | ICD-10-CM | POA: Insufficient documentation

## 2021-08-08 DIAGNOSIS — I2510 Atherosclerotic heart disease of native coronary artery without angina pectoris: Secondary | ICD-10-CM | POA: Insufficient documentation

## 2021-08-08 DIAGNOSIS — Z8249 Family history of ischemic heart disease and other diseases of the circulatory system: Secondary | ICD-10-CM | POA: Diagnosis not present

## 2021-08-08 DIAGNOSIS — Z882 Allergy status to sulfonamides status: Secondary | ICD-10-CM | POA: Diagnosis not present

## 2021-08-08 DIAGNOSIS — Z85828 Personal history of other malignant neoplasm of skin: Secondary | ICD-10-CM | POA: Insufficient documentation

## 2021-08-08 DIAGNOSIS — N400 Enlarged prostate without lower urinary tract symptoms: Secondary | ICD-10-CM | POA: Diagnosis not present

## 2021-08-08 DIAGNOSIS — N62 Hypertrophy of breast: Secondary | ICD-10-CM | POA: Diagnosis not present

## 2021-08-08 DIAGNOSIS — I6782 Cerebral ischemia: Secondary | ICD-10-CM | POA: Insufficient documentation

## 2021-08-08 DIAGNOSIS — R599 Enlarged lymph nodes, unspecified: Secondary | ICD-10-CM | POA: Insufficient documentation

## 2021-08-08 DIAGNOSIS — Z885 Allergy status to narcotic agent status: Secondary | ICD-10-CM | POA: Insufficient documentation

## 2021-08-08 DIAGNOSIS — R21 Rash and other nonspecific skin eruption: Secondary | ICD-10-CM | POA: Diagnosis not present

## 2021-08-08 DIAGNOSIS — Z79899 Other long term (current) drug therapy: Secondary | ICD-10-CM | POA: Insufficient documentation

## 2021-08-08 DIAGNOSIS — Z8042 Family history of malignant neoplasm of prostate: Secondary | ICD-10-CM | POA: Insufficient documentation

## 2021-08-08 DIAGNOSIS — L309 Dermatitis, unspecified: Secondary | ICD-10-CM | POA: Insufficient documentation

## 2021-08-08 DIAGNOSIS — C7800 Secondary malignant neoplasm of unspecified lung: Secondary | ICD-10-CM | POA: Diagnosis not present

## 2021-08-08 DIAGNOSIS — H4910 Fourth [trochlear] nerve palsy, unspecified eye: Secondary | ICD-10-CM | POA: Insufficient documentation

## 2021-08-08 DIAGNOSIS — Z9221 Personal history of antineoplastic chemotherapy: Secondary | ICD-10-CM | POA: Insufficient documentation

## 2021-08-08 DIAGNOSIS — C661 Malignant neoplasm of right ureter: Secondary | ICD-10-CM | POA: Insufficient documentation

## 2021-08-08 MED ORDER — PREDNISONE 10 MG PO TABS
ORAL_TABLET | ORAL | 0 refills | Status: DC
Start: 2021-08-08 — End: 2021-09-16

## 2021-08-08 NOTE — Telephone Encounter (Signed)
Spoke to pt and offered [facility]. He is agreeable. Appointment scheduled.

## 2021-08-08 NOTE — Progress Notes (Signed)
Symptom Management [place] at [facility] [phone] [phone]  Patient Care Team: Idelle Crouch, MD as PCP - General (Internal Medicine) Sindy Guadeloupe, MD as Consulting Physician (Hematology and Oncology) Sindy Guadeloupe, MD as Consulting Physician (Hematology and Oncology) Sindy Guadeloupe, MD as Consulting Physician (Hematology and Oncology)   Name of the patient: Anthony Gonzales  [id number]  14-Jan-1932   Date of visit: 08/08/21  Reason for Consult: PERICLES CARMICHEAL is a 88 y.o. male with multiple medical problems including history of bladder cancer status post TURBT, metastatic urothelial carcinoma status post systemic chemotherapy with carbo/Gemzar with disease progression in March 2020 and rotated to second line Keytruda.  Patient was found to have dermatitis on Keytruda requiring rotation to third line Padcev.  He also had consistent skin rashes on Padcev.  Patient last saw Dr. 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  MRI of the brain showed acute/subacute infarct and a mass consistent with a vestibular schwannoma.  He was evaluated by neurology who recommended watchful monitoring.  Patient had an erythematous rash on bilateral arms, which had waxed and waned.  Patient was evaluated by Dr. Evorn Gong from dermatology and was using topical emollient creams.  Patient received cycle 14 Padcev on 5/15.  Patient presents to [facility] today with complaint of worsening erythematous rash on arms/chest with some swelling to bilateral arms.  He says that it is occasionally pruritic but not painful.  He has been using a steroid cream prescribed by his dermatologist.  Denies any neurologic complaints. Denies recent fevers or illnesses. Denies any easy bleeding or bruising. Reports good appetite and denies weight loss. Denies chest pain. Denies any  nausea, vomiting, constipation, or diarrhea. Denies urinary complaints. Patient offers no further specific complaints today.  PAST MEDICAL HISTORY: Past Medical History:  Diagnosis Date   Arthritis    Benign fibroma of prostate 08/23/2013   BPH (benign prostatic hyperplasia)    Calculus of kidney 08/23/2013   Glaucoma    no drops in 3 mo pressure good, pt denies glaucoma, eye pressure has been measuring alright.   History of kidney stones    HLD (hyperlipidemia)    HOH (hard of hearing)    Left Hearing Aid   HTN (hypertension) 12/26/2014   Hypertension    Hyponatremia 12/26/2014   Migraines    history of migraines when he was younger.   Restless leg syndrome    Sinus drainage    Skin cancer    Skin cancer    left hand 06/2019   Urothelial cancer ([place])    chemo tx's.   UTI (lower urinary tract infection) 12/26/2014   Vertigo     PAST SURGICAL HISTORY:  Past Surgical History:  Procedure Laterality Date   COLONOSCOPY     CYSTOSCOPY W/ RETROGRADES Right 01/30/2015   Procedure: CYSTOSCOPY WITH RETROGRADE PYELOGRAM;  Surgeon: [name], MD;  Location: [facility];  Service: Urology;  Laterality: Right;   CYSTOSCOPY W/ RETROGRADES Bilateral 02/26/2016   Procedure: CYSTOSCOPY WITH RETROGRADE PYELOGRAM;  Surgeon: [name], MD;  Location: [facility];  Service: Urology;  Laterality: Bilateral;   CYSTOSCOPY W/ URETERAL STENT PLACEMENT Right 08/20/2015   Procedure: CYSTOSCOPY WITH RETROGRADE PYELOGRAM/POSSIBLE URETERAL STENT PLACEMENT/BLADDER BIOPSY;  Surgeon: [name], MD;  Location: [facility];  Service: Urology;  Laterality: Right;   CYSTOSCOPY W/ URETERAL STENT PLACEMENT Right 09/12/2015  Procedure: CYSTOSCOPY WITH STENT REPLACEMENT;  Surgeon: [name], MD;  Location: [facility];  Service: Urology;  Laterality: Right;   CYSTOSCOPY WITH BIOPSY Right 09/12/2015   Procedure: CYSTOSCOPY WITH BLADDER AND URETERAL BIOPSY;  Surgeon: [name], MD;  Location: [facility];   Service: Urology;  Laterality: Right;   CYSTOSCOPY WITH STENT PLACEMENT Right 01/30/2015   Procedure: CYSTOSCOPY WITH STENT PLACEMENT;  Surgeon: [name], MD;  Location: [facility];  Service: Urology;  Laterality: Right;   CYSTOSCOPY WITH STENT PLACEMENT Right 12/28/2017   Procedure: CYSTOSCOPY WITH STENT Exchange;  Surgeon: [name], MD;  Location: [facility];  Service: Urology;  Laterality: Right;   CYSTOSCOPY/URETEROSCOPY/HOLMIUM LASER/STENT PLACEMENT Right 08/19/2017   Procedure: CYSTOSCOPY/URETEROSCOPY/HOLMIUM LASER/STENT PLACEMENT;  Surgeon: [name], MD;  Location: [facility];  Service: Urology;  Laterality: Right;   EYE SURGERY Bilateral    Cataract Extraction with IOL   goiter removal     HOLMIUM LASER APPLICATION N/A 2/70/6237   Procedure:  HOLMIUM LASER APPLICATION;  Surgeon: [name], MD;  Location: [facility];  Service: Urology;  Laterality: N/A;   PORTA CATH INSERTION N/A 09/23/2017   Procedure: PORTA CATH INSERTION;  Surgeon: Algernon Huxley, MD;  Location: [facility];  Service: Cardiovascular;  Laterality: N/A;   SPERMATOCELECTOMY     TONSILLECTOMY     TRANSURETHRAL RESECTION OF BLADDER TUMOR N/A 02/26/2016   Procedure: TRANSURETHRAL RESECTION OF BLADDER TUMOR (TURBT);  Surgeon: [name], MD;  Location: [facility];  Service: Urology;  Laterality: N/A;   TRANSURETHRAL RESECTION OF BLADDER TUMOR WITH MITOMYCIN-C N/A 09/12/2015   Procedure: TRANSURETHRAL RESECTION OF BLADDER TUMOR ;  Surgeon: [name], MD;  Location: [facility];  Service: Urology;  Laterality: N/A;   TRANSURETHRAL RESECTION OF BLADDER TUMOR WITH MITOMYCIN-C N/A 03/24/2016   Procedure: TRANSURETHRAL RESECTION OF BLADDER TUMOR WITH MITOMYCIN-C  (SMALL);  Surgeon: [name], MD;  Location: [facility];  Service: Urology;  Laterality: N/A;   URETERAL BIOPSY Right 08/19/2017   Procedure: Renal Mass BIOPSY;  Surgeon: [name], MD;  Location: [facility];  Service: Urology;  Laterality:  Right;   URETEROSCOPY Right 01/30/2015   Procedure: URETEROSCOPY/ WITH BIOPSY AND CYTOLOGY BRUSHING;  Surgeon: [name], MD;  Location: [facility];  Service: Urology;  Laterality: Right;   URETEROSCOPY Right 08/20/2015   Procedure: URETEROSCOPY;  Surgeon: [name], MD;  Location: [facility];  Service: Urology;  Laterality: Right;   URETEROSCOPY Right 09/12/2015   Procedure: URETEROSCOPY;  Surgeon: [name], MD;  Location: [facility];  Service: Urology;  Laterality: Right;   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[place])  09/15/2017 Cancer Staging   Staging form: Renal Pelvis and Ureter, AJCC 8th Edition - Clinical stage from 09/15/2017: Stage IV (cT2, cN0, cM1) - Signed by Sindy Guadeloupe, MD on 09/17/2017    09/17/2017 Initial Diagnosis   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[place])    09/22/2017 - 05/26/2018 Chemotherapy   The patient had dexamethasone (DECADRON) 4 MG tablet, 8 mg, Oral, Daily, 1 of 1 cycle, Start date: 05/11/2018, End date: 06/06/2018 palonosetron (ALOXI) injection 0.25 mg, 0.25 mg, Intravenous,  Once, 9 of 10 cycles Administration: 0.25 mg (09/22/2017), 0.25 mg (10/06/2017), 0.25 mg (10/20/2017), 0.25 mg (11/03/2017), 0.25 mg (11/17/2017), 0.25 mg (12/01/2017), 0.25 mg (12/15/2017), 0.25 mg (12/29/2017), 0.25 mg (01/12/2018), 0.25  mg (01/26/2018), 0.25 mg (02/09/2018), 0.25 mg (02/23/2018), 0.25 mg (03/16/2018), 0.25 mg (04/13/2018), 0.25 mg (05/11/2018) pegfilgrastim-cbqv (UDENYCA) injection 6 mg, 6 mg, Subcutaneous, Once, 5 of 6 cycles Administration: 6 mg (01/13/2018), 6 mg (02/10/2018), 6 mg (03/17/2018), 6 mg (04/15/2018), 6 mg (05/12/2018), 6 mg (05/26/2018) CARBOplatin (PARAPLATIN) 160 mg in sodium chloride 0.9 % 250 mL chemo infusion, 160 mg (100 % of original dose 163.2 mg), Intravenous,  Once,  9 of 10 cycles Dose modification:   (original dose 163.2 mg, Cycle 1) Administration: 160 mg (09/22/2017), 170 mg (10/06/2017), 170 mg (10/20/2017), 170 mg (11/17/2017), 160 mg (12/01/2017), 170 mg (11/03/2017), 160 mg (12/15/2017), 160 mg (12/29/2017), 180 mg (01/12/2018), 180 mg (01/26/2018), 160 mg (02/09/2018), 160 mg (02/23/2018), 160 mg (03/16/2018), 160 mg (03/30/2018), 160 mg (04/13/2018), 180 mg (04/27/2018), 180 mg (05/11/2018), 160 mg (05/25/2018) gemcitabine (GEMZAR) 1,600 mg in sodium chloride 0.9 % 250 mL chemo infusion, 1,672 mg (100 % of original dose 800 mg/m2), Intravenous,  Once, 9 of 10 cycles Dose modification: 800 mg/m2 (original dose 800 mg/m2, Cycle 1, Reason: Patient Age) Administration: 1,600 mg (09/22/2017), 1,600 mg (10/06/2017), 1,600 mg (10/20/2017), 1,600 mg (11/03/2017), 1,600 mg (11/17/2017), 1,600 mg (12/01/2017), 1,600 mg (12/15/2017), 1,600 mg (12/29/2017), 1,600 mg (01/12/2018), 1,600 mg (01/26/2018), 1,600 mg (02/09/2018), 1,600 mg (02/23/2018), 1,600 mg (03/16/2018), 1,600 mg (03/30/2018), 1,600 mg (04/13/2018), 1,600 mg (04/27/2018), 1,600 mg (05/11/2018), 1,600 mg (05/25/2018)   for chemotherapy treatment.     06/08/2018 - 12/21/2018 Chemotherapy   The patient had pembrolizumab (KEYTRUDA) 200 mg in sodium chloride 0.9 % 50 mL chemo infusion, 200 mg, Intravenous, Once, 10 of 11 cycles Administration: 200 mg (06/08/2018), 200 mg (06/29/2018), 200 mg (07/20/2018), 200 mg (08/13/2018), 200 mg (09/03/2018), 200 mg (09/24/2018), 200 mg (10/19/2018), 200 mg (11/09/2018), 200 mg (11/30/2018), 200 mg (12/21/2018)   for chemotherapy treatment.     02/15/2019 -  Chemotherapy   Patient is on Treatment Plan : UROTHELIAL LOCALLY ADVANCED / METASTATIC Enfortumab q28d        ALLERGIES:  is allergic to demerol [meperidine], lipitor [atorvastatin], and sulfa antibiotics.  MEDICATIONS:  Current Outpatient Medications  Medication Sig Dispense Refill   acetaminophen (TYLENOL) 500 MG tablet Take 500 mg by mouth every  6 (six) hours as needed.     amLODipine (NORVASC) 5 MG tablet  (Patient not taking: Reported on 07/22/2021)     aspirin 81 MG EC tablet Take by mouth. (Patient not taking: Reported on 07/22/2021)     camphor-menthol (SARNA) lotion Apply 1 application topically as needed for itching.     clopidogrel (PLAVIX) 75 MG tablet Take 1 tablet (75 mg total) by mouth daily. 30 tablet 0   diazepam (VALIUM) 5 MG tablet TAKE 1 TABLET (5 MG TOTAL) BY MOUTH EVERY 12 (TWELVE) HOURS AS NEEDED FOR ANXIETY OR SLEEP     docusate sodium (COLACE) 250 MG capsule Take 250 mg by mouth daily.     fenofibrate micronized (LOFIBRA) 134 MG capsule Take 134 mg by mouth daily before breakfast.      gemfibrozil (LOPID) 600 MG tablet Take 600 mg by mouth 2 (two) times daily before a meal.     HYDROcodone-acetaminophen (NORCO/VICODIN) 5-325 MG tablet Take 1 tablet by mouth every 6 (six) hours as needed for severe pain. (Patient not taking: Reported on 07/22/2021) 30 tablet 0   Multiple Vitamin (MULTIVITAMIN WITH MINERALS) TABS tablet Take 1 tablet by mouth daily.     nystatin (MYCOSTATIN/NYSTOP) powder Apply 1 application topically 3 (three) times  daily. To under arms area that has rash 30 g 0   polyethylene glycol (MIRALAX / GLYCOLAX) 17 g packet Take 17 g by mouth daily.     prochlorperazine (COMPAZINE) 10 MG tablet Take 1 tablet (10 mg total) by mouth every 6 (six) hours as needed for nausea or vomiting. (Patient not taking: Reported on 07/01/2021) 30 tablet 0   psyllium (METAMUCIL) 58.6 % packet Take 1 packet by mouth daily.     senna (SENOKOT) 8.6 MG tablet Take 1 tablet by mouth daily. (Patient not taking: Reported on 07/22/2021)     tamsulosin (FLOMAX) 0.4 MG CAPS capsule Take 1 capsule (0.4 mg total) by mouth daily. 90 capsule 3   triamcinolone cream (KENALOG) 0.1 % Apply 1 application. topically 2 (two) times daily.     urea (CARMOL) 10 % cream Apply topically 3 (three) times daily. NOT THE HANDS UNTIL IT CLEARS UP (Patient not  taking: Reported on 06/10/2021) 71 g 0   No current facility-administered medications for this visit.   Facility-Administered Medications Ordered in Other Visits  Medication Dose Route Frequency Provider Last Rate Last Admin   heparin lock flush 100 UNIT/ML injection            heparin lock flush 100 UNIT/ML injection            sodium chloride flush (NS) 0.9 % injection 10 mL  10 mL Intravenous Once Sindy Guadeloupe, MD       sodium chloride flush (NS) 0.9 % injection 10 mL  10 mL Intravenous PRN Sindy Guadeloupe, MD        VITAL SIGNS: There were no vitals taken for this visit. There were no vitals filed for this visit.  Estimated body mass index is 25.46 kg/m as calculated from the following:   Height as of 07/12/21: 6' (1.829 m).   Weight as of 07/22/21: 187 lb 11.2 oz (85.1 kg).  LABS: CBC:    Component Value Date/Time   WBC 6.0 07/29/2021 1245   HGB 13.4 07/29/2021 1245   HGB 14.1 09/04/2017 0949   HCT 39.1 07/29/2021 1245   HCT 40.5 09/04/2017 0949   PLT 267 07/29/2021 1245   PLT 282 09/04/2017 0949   MCV 88.3 07/29/2021 1245   MCV 86 09/04/2017 0949   MCV 88 03/23/2013 0418   NEUTROABS 3.1 07/29/2021 1245   NEUTROABS 4.6 09/04/2017 0949   NEUTROABS 2.8 03/23/2013 0418   LYMPHSABS 1.8 07/29/2021 1245   LYMPHSABS 1.9 09/04/2017 0949   LYMPHSABS 1.2 03/23/2013 0418   MONOABS 1.0 07/29/2021 1245   MONOABS 0.8 03/23/2013 0418   EOSABS 0.1 07/29/2021 1245   EOSABS 0.3 09/04/2017 0949   EOSABS 0.1 03/23/2013 0418   BASOSABS 0.1 07/29/2021 1245   BASOSABS 0.0 09/04/2017 0949   BASOSABS 0.0 03/23/2013 0418   Comprehensive Metabolic Panel:    Component Value Date/Time   NA 134 (L) 07/29/2021 1245   NA 140 03/23/2013 0418   K 3.9 07/29/2021 1245   K 3.8 03/23/2013 0418   CL 101 07/29/2021 1245   CL 111 (H) 03/23/2013 0418   CO2 27 07/29/2021 1245   CO2 24 03/23/2013 0418   BUN 19 07/29/2021 1245   BUN 24 (H) 03/23/2013 0418   CREATININE 0.90 07/29/2021 1245    CREATININE 1.79 (H) 03/23/2013 0418   GLUCOSE 107 (H) 07/29/2021 1245   GLUCOSE 98 03/23/2013 0418   CALCIUM 8.2 (L) 07/29/2021 1245   CALCIUM 8.0 (L) 03/23/2013 5277  AST 36 07/29/2021 1245   AST 40 (H) 03/22/2013 0451   ALT 28 07/29/2021 1245   ALT 28 03/22/2013 0451   ALKPHOS 59 07/29/2021 1245   ALKPHOS 32 (L) 03/22/2013 0451   BILITOT 0.7 07/29/2021 1245   BILITOT 0.3 03/22/2013 0451   PROT 6.4 (L) 07/29/2021 1245   PROT 5.5 (L) 03/22/2013 0451   ALBUMIN 3.5 07/29/2021 1245   ALBUMIN 2.7 (L) 03/22/2013 0451    RADIOGRAPHIC STUDIES: CT Chest W Contrast  Result Date: 07/16/2021 CLINICAL DATA:  Metastatic right bladder and ureteral cancer, lung metastases, ongoing chemotherapy * Tracking Code: BO * EXAM: CT CHEST WITH CONTRAST TECHNIQUE: Multidetector CT imaging of the chest was performed during intravenous contrast administration. RADIATION DOSE REDUCTION: This exam was performed according to the departmental dose-optimization program which includes automated exposure control, adjustment of the mA and/or kV according to patient size and/or use of iterative reconstruction technique. CONTRAST:  72mL OMNIPAQUE IOHEXOL 300 MG/ML  SOLN COMPARISON:  04/05/2021 FINDINGS: Cardiovascular: Right chest port catheter. Aortic atherosclerosis. Aortic valve calcifications. Normal heart size. Left coronary artery calcifications. No pericardial effusion. Mediastinum/Nodes: No enlarged mediastinal, hilar, or axillary lymph nodes. Thyroid gland, trachea, and esophagus demonstrate no significant findings. Lungs/Pleura: Unchanged nodule of the medial right upper lobe measuring 1.4 x 1.0 cm (series 3, image 78). Additional much smaller nodules are unchanged, for example in the superior segment left lower lobe measuring 0.3 cm (series 3, image 84). Background of very fine centrilobular pulmonary nodules, most concentrated in the lung apices. Mild scarring of the bilateral lung bases. No pleural effusion or  pneumothorax. Upper Abdomen: No acute abnormality. Small gallstones in the dependent gallbladder. Musculoskeletal: Bilateral gynecomastia. No suspicious osseous lesions identified. IMPRESSION: 1. Unchanged pulmonary nodules, largest of the medial right upper lobe. Findings are consistent with stable pulmonary metastatic disease. 2. Background of very fine centrilobular pulmonary nodules, most concentrated in the lung apices, most commonly seen in smoking-related respiratory bronchiolitis. 3. Coronary artery disease. Aortic Atherosclerosis (ICD10-I70.0). Electronically Signed   By: Delanna Ahmadi M.D.   On: 07/16/2021 10:44   MR Brain W Wo Contrast  Result Date: 07/12/2021 CLINICAL DATA:  Provided history: Cranial nerve palsy. Fourth cranial nerve palsy. Additional history provided by scanning technologist: Double vision for 2-3 weeks. History of bladder cancer. EXAM: MRI HEAD WITHOUT AND WITH CONTRAST TECHNIQUE: Multiplanar, multiecho pulse sequences of the brain and surrounding structures were obtained without and with intravenous contrast. CONTRAST:  7.15mL GADAVIST GADOBUTROL 1 MMOL/ML IV SOLN COMPARISON:  None. FINDINGS: Brain: No age-advanced or lobar predominant parenchymal atrophy. 9 mm focus of restricted diffusion and T2 FLAIR hyperintense signal abnormality (without corresponding enhancement) in the left aspect of the callosal splenium, likely reflecting an acute/subacute infarct (for instance as seen on series 5, image 29) (series 7, image 15). Avidly enhancing mass within the right cerebellopontine angle and occupying the majority of the right internal auditory canal, measuring 23 x 15 mm in transaxial dimensions. This is most compatible with a vestibular schwannoma. There is mild cystic change within the portion of the tumor within the right cerebellopontine angle. There is also irregular SWI signal loss within this portion of the tumor, which may reflect mineralization or vessels. Mild mass effect  upon the adjacent pons, right middle cerebellar peduncle and right cerebellar hemisphere without underlying parenchymal edema. Mild multifocal T2 FLAIR hyperintense signal abnormality within the cerebral white matter, nonspecific but compatible with chronic small vessel ischemic disease. Chronic microhemorrhage within the posteromedial right temporal lobe. Multiple  chronic infarcts within the right cerebellar hemisphere measuring up to 15 mm. 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 No extra-axial fluid collection. No midline shift. Vascular: Maintained flow voids within the proximal large arterial vessels. Skull and upper cervical spine: No focal suspicious marrow lesion. Sinuses/Orbits: No mass or acute finding within the imaged orbits. Prior bilateral ocular lens replacement. Moderate mucosal thickening within the right maxillary sinus. Mild mucosal thickening within the bilateral ethmoid sinuses. Mucosal thickening, and possible fluid, within the right frontoethmoidal recess. Other: Small-volume fluid within the bilateral mastoid air cells. Impressions #1 and #2 will be called to the ordering clinician or representative by the Radiologist Assistant, and communication documented in the PACS or [employer]. IMPRESSION: 1. 9 mm focus of signal abnormality within the left aspect of the callosal splenium, as described and likely reflecting an acute/subacute infarct. 2. 23 x 15 mm avidly enhancing mass within the right cerebellopontine angle and internal auditory canal, most compatible with a vestibular schwannoma. Mild mass effect upon the adjacent pons, right middle cerebellar peduncle and right cerebellum without underlying parenchymal edema. 3. No specific cause of the reported trochlear nerve palsy is identified. 4. Mild chronic small ischemic changes within the cerebral white matter. 5. Chronic infarcts within the right cerebellar hemisphere measuring up to 15 mm. 6. Paranasal sinus  disease, as described. 7. Small-volume fluid within the bilateral mastoid air cells. Electronically Signed   By: Kellie Simmering D.O.   On: 07/12/2021 16:30    PERFORMANCE STATUS (ECOG) : 1 - Symptomatic but completely ambulatory  Review of Systems Unless otherwise noted, a complete review of systems is negative.  Physical Exam General: NAD Pulmonary: Unlabored Extremities: Bilateral upper extremity edema, no joint deformities Skin: Confluent erythematous rash to bilateral arms and chest Neurological: Weakness but otherwise nonfocal  Assessment and Plan- Patient is a 88 y.o. male with multiple medical problems including history of bladder cancer status post TURBT, metastatic urothelial carcinoma status post systemic chemotherapy with carbo/Gemzar with disease progression in March 2020 and rotated to second line Keytruda.  Patient was found to have dermatitis on Keytruda requiring rotation to third line Padcev.  He also had consistent skin rashes on Padcev.      Rash -likely drug-induced or at least exacerbated by treatment given chronicity and recurrence of erythematous arm rash.  Patient is followed actively by dermatology.  Patient says that he saw Dr. Evorn Gong two weeks ago. Today, his rash does appear worse in comparison to pictures taken by Dr. Janese Banks on 07/22/2021.  Patient reports that he has previously tolerated steroids with good effect at improving his rashes in the past.  We will proceed with prednisone taper.  Patient to follow-up with dermatology if no improvement.  We will hold on further Padcev until patient is evaluated by Dr. Janese Banks in 2 weeks.    Patient expressed understanding and was in agreement with this plan. He also understands that He can call clinic at any time with any questions, concerns, or complaints.   Thank you for allowing me to participate in the care of this very pleasant patient.   Time Total: 15 minutes  Visit consisted of counseling and education dealing with the  complex and emotionally intense issues of symptom management in the setting of serious illness.Greater than 50%  of this time was spent counseling and coordinating care related to the above assessment and plan.  Signed by: Altha Harm, PhD, NP-C

## 2021-08-08 NOTE — Telephone Encounter (Signed)
Smc today. Cancel treatment next week. I will see him after I get back

## 2021-08-08 NOTE — Progress Notes (Signed)
Pt presents with bilateral arm and abdominal rash, which has been present since starting treatment, according to patient. Pt is concerned today that arms are more swollen than baseline, and has worsened over the last 2 days. Denies itching or pain. States that he has been using a moisturizer.

## 2021-08-08 NOTE — Telephone Encounter (Signed)
Patient called reporting that both of his arms that have the rash from his treatment are now red and swollen from his shoulders down into his hands. He states that he does not see how he will be able to even take treatment next week with the way they look right now. He denies fever, but reports that he had an episode of chills yesterday which subsided. Please advise

## 2021-08-12 ENCOUNTER — Inpatient Hospital Stay: Payer: PPO

## 2021-08-12 ENCOUNTER — Inpatient Hospital Stay: Payer: PPO | Admitting: Nurse Practitioner

## 2021-08-12 ENCOUNTER — Telehealth: Payer: Self-pay | Admitting: *Deleted

## 2021-08-12 NOTE — Telephone Encounter (Signed)
Patient called stating that he is on Prednisone until the 71 th and he is for treatment on the 14 th and thought he was told he can not get treatment if on prednisone. Please advise

## 2021-08-13 ENCOUNTER — Encounter: Payer: Self-pay | Admitting: Oncology

## 2021-08-13 NOTE — Telephone Encounter (Signed)
Call returned to patient, I had to leave message on voice mail to please keep his appointment as scheduled so that Dr Janese Banks can evaluate him

## 2021-08-15 DIAGNOSIS — H4911 Fourth [trochlear] nerve palsy, right eye: Secondary | ICD-10-CM | POA: Diagnosis not present

## 2021-08-20 MED FILL — Dexamethasone Sodium Phosphate Inj 100 MG/10ML: INTRAMUSCULAR | Qty: 1 | Status: AC

## 2021-08-21 ENCOUNTER — Other Ambulatory Visit: Payer: Self-pay | Admitting: *Deleted

## 2021-08-21 ENCOUNTER — Inpatient Hospital Stay: Payer: PPO

## 2021-08-21 ENCOUNTER — Encounter: Payer: Self-pay | Admitting: Oncology

## 2021-08-21 ENCOUNTER — Other Ambulatory Visit: Payer: Self-pay

## 2021-08-21 ENCOUNTER — Inpatient Hospital Stay (HOSPITAL_BASED_OUTPATIENT_CLINIC_OR_DEPARTMENT_OTHER): Payer: PPO | Admitting: Oncology

## 2021-08-21 VITALS — BP 155/89 | HR 86 | Temp 97.6°F | Resp 18 | Wt 187.3 lb

## 2021-08-21 DIAGNOSIS — C669 Malignant neoplasm of unspecified ureter: Secondary | ICD-10-CM

## 2021-08-21 DIAGNOSIS — C661 Malignant neoplasm of right ureter: Secondary | ICD-10-CM | POA: Diagnosis not present

## 2021-08-21 DIAGNOSIS — L270 Generalized skin eruption due to drugs and medicaments taken internally: Secondary | ICD-10-CM | POA: Diagnosis not present

## 2021-08-21 LAB — CBC WITH DIFFERENTIAL/PLATELET
Abs Immature Granulocytes: 0.08 10*3/uL — ABNORMAL HIGH (ref 0.00–0.07)
Basophils Absolute: 0.1 10*3/uL (ref 0.0–0.1)
Basophils Relative: 1 %
Eosinophils Absolute: 0.1 10*3/uL (ref 0.0–0.5)
Eosinophils Relative: 1 %
HCT: 43.4 % (ref 39.0–52.0)
Hemoglobin: 14.9 g/dL (ref 13.0–17.0)
Immature Granulocytes: 1 %
Lymphocytes Relative: 30 %
Lymphs Abs: 3.2 10*3/uL (ref 0.7–4.0)
MCH: 30.8 pg (ref 26.0–34.0)
MCHC: 34.3 g/dL (ref 30.0–36.0)
MCV: 89.7 fL (ref 80.0–100.0)
Monocytes Absolute: 1 10*3/uL (ref 0.1–1.0)
Monocytes Relative: 10 %
Neutro Abs: 6.4 10*3/uL (ref 1.7–7.7)
Neutrophils Relative %: 57 %
Platelets: 286 10*3/uL (ref 150–400)
RBC: 4.84 MIL/uL (ref 4.22–5.81)
RDW: 16.7 % — ABNORMAL HIGH (ref 11.5–15.5)
WBC: 10.9 10*3/uL — ABNORMAL HIGH (ref 4.0–10.5)
nRBC: 0 % (ref 0.0–0.2)

## 2021-08-21 LAB — COMPREHENSIVE METABOLIC PANEL
ALT: 27 U/L (ref 0–44)
AST: 28 U/L (ref 15–41)
Albumin: 3.5 g/dL (ref 3.5–5.0)
Alkaline Phosphatase: 74 U/L (ref 38–126)
Anion gap: 9 (ref 5–15)
BUN: 22 mg/dL (ref 8–23)
CO2: 26 mmol/L (ref 22–32)
Calcium: 8.4 mg/dL — ABNORMAL LOW (ref 8.9–10.3)
Chloride: 102 mmol/L (ref 98–111)
Creatinine, Ser: 0.87 mg/dL (ref 0.61–1.24)
GFR, Estimated: >60 mL/min (ref >60)
Glucose, Bld: 136 mg/dL — ABNORMAL HIGH (ref 70–99)
Potassium: 3.7 mmol/L (ref 3.5–5.1)
Sodium: 137 mmol/L (ref 135–145)
Total Bilirubin: 0.7 mg/dL (ref 0.3–1.2)
Total Protein: 6.2 g/dL — ABNORMAL LOW (ref 6.5–8.1)

## 2021-08-21 MED ORDER — HEPARIN SOD (PORK) LOCK FLUSH 100 UNIT/ML IV SOLN
500.0000 [IU] | Freq: Once | INTRAVENOUS | Status: AC
  Filled 2021-08-21: qty 5

## 2021-08-21 MED ORDER — PANTOPRAZOLE SODIUM 20 MG PO TBEC: 20.0000 mg | DELAYED_RELEASE_TABLET | Freq: Every day | ORAL | 0 refills | Status: AC

## 2021-08-21 MED ORDER — PREDNISONE 10 MG PO TABS: ORAL_TABLET | ORAL | 0 refills | Status: AC

## 2021-08-21 MED ORDER — SODIUM CHLORIDE 0.9% FLUSH
10.0000 mL | INTRAVENOUS | Status: AC | PRN
  Administered 2021-08-21: 10 mL via INTRAVENOUS
  Filled 2021-08-21: qty 10

## 2021-08-21 NOTE — Progress Notes (Signed)
Last Thursday (08/14/2021) pt had a fall ---reached over to put his shirt up and when he tirned around he fell and hurt his elow, bruised the bone in his hip does not bother him unless he sits on the toilet. Seems very frustrated becuase he states he did not have any indication that he was falling. Also, states that he has been experiencing swelling in his arms; currently on prednisone

## 2021-08-21 NOTE — Progress Notes (Signed)
Hematology/Oncology Consult note [facility]  Telephone:([phone] [phone]  Patient Care Team: Idelle Crouch, MD as PCP - General (Internal Medicine) Sindy Guadeloupe, MD as Consulting Physician (Hematology and Oncology) Sindy Guadeloupe, MD as Consulting Physician (Hematology and Oncology) Sindy Guadeloupe, MD as Consulting Physician (Hematology and Oncology)   Name of the patient: Anthony Gonzales  [id number]  [birth year]   Date of visit: 08/21/21  Diagnosis- Metastatic urothelial carcinoma with possible mets to the obturator node. Indeterminate hilar lymph nodes and LUL lung nodule      Chief complaint/ Reason for visit-discuss further management of urothelial carcinoma  Heme/Onc history: patient is a 88 year old male with past medical history significant for hypertension and long-standing history of superficial bladder cancer for which he sees [name].  He has undergone TURBT as well as ureteroscopy since 2017 along with mitomycin as well in the past.  Most recently he underwent CT abdomen on 08/06/2017 which showed a soft tissue mass in the right renal pelvis concerning for upper tract urothelial neoplasm.  Soft tissue fullness at the right ureterovesical junction with proximal right hydroureteronephrosis.  Several millimeter attenuation lesion in the pancreatic head.   He underwent diagnostic ureteroscopy and was found to have 2 high-grade lesions within his right kidney.  There was a nodular high-grade appearing lesion in the right anterior renal pelvis.  There was also a second ureteral tumor fungating from the right ureteral orifice extending into the distal ureter also consistent with high-grade invasive urothelial carcinoma.  Muscle invasion could not be assessed.  He also underwent a CT chest which showed a rounded nodule in the right upper lobe measuring 14 mm concerning for metastases.  2 other 3 mm lesions were also noted in the left  upper lobe likely benign   Plan initially was neoadjuvant chemotherapy followed by possible surgery but given the presence of lung lesion patient has been referred to oncology for the same.   PET/CT on 09/21/17 showed: IMPRESSION: 1. Hypermetabolic right obturator lymph node is most indicative of metastatic disease. 2. Mildly hypermetabolic left hilar lymph nodes are nonspecific. Continued attention on follow-up exams is warranted. 3. Right upper lobe pulmonary nodule shows metabolism after just above blood pool and is therefore indeterminate. Continued attention on follow-up exams is warranted. 4. Aortic atherosclerosis (ICD10-170.0). Coronary artery calcification. 5. Cholelithiasis    Carboplatin/ gemzar 1 week on and one-week off as patient could not tolerate 2-week on and one week off regimen.  Cycle 1 started on 09/22/2017 Disease progression in March 2020.  Switched to second [employer]   Patient had disease controlled with Keytruda.  However he was noted to have worsening dermatitis with constant flareups requiring steroids.  Beryle Flock is therefore being kept on hold and patient will be switched to third line Padcev.    Treatment has been on hold since February 2021 due to constant flareup of skin rash   Scans in October 2022 showed disease progression with new lung nodules as well as pelvic sidewall adenopathy.  Padcev restarted in November 2022  Interval history-skin rash over bilateral upper extremities better after starting prednisone taper.  He also uses topical Benadryl cream to help with itching.  Continues to have mild blurry vision which is overall stable.  ECOG PS- 1 Pain scale- 0   Review of systems- Review of Systems  Constitutional:  Negative for chills, fever, malaise/fatigue and weight loss.  HENT:  Negative for congestion, ear discharge and nosebleeds.  Eyes:  Negative for blurred vision.  Respiratory:  Negative for cough, hemoptysis, sputum production,  shortness of breath and wheezing.   Cardiovascular:  Negative for chest pain, palpitations, orthopnea and claudication.  Gastrointestinal:  Negative for abdominal pain, blood in stool, constipation, diarrhea, heartburn, melena, nausea and vomiting.  Genitourinary:  Negative for dysuria, flank pain, frequency, hematuria and urgency.  Musculoskeletal:  Negative for back pain, joint pain and myalgias.  Skin:  Positive for rash.  Neurological:  Negative for dizziness, tingling, focal weakness, seizures, weakness and headaches.  Endo/Heme/Allergies:  Does not bruise/bleed easily.  Psychiatric/Behavioral:  Negative for depression and suicidal ideas. The patient does not have insomnia.        Allergies  Allergen Reactions   Demerol [Meperidine] Nausea And Vomiting   Lipitor [Atorvastatin] Swelling   Sulfa Antibiotics Nausea And Vomiting and Rash     Past Medical History:  Diagnosis Date   Arthritis    Benign fibroma of prostate 08/23/2013   BPH (benign prostatic hyperplasia)    Calculus of kidney 08/23/2013   Glaucoma    no drops in 3 mo pressure good, pt denies glaucoma, eye pressure has been measuring alright.   History of kidney stones    HLD (hyperlipidemia)    HOH (hard of hearing)    Left Hearing Aid   HTN (hypertension) 12/26/2014   Hypertension    Hyponatremia 12/26/2014   Migraines    history of migraines when he was younger.   Restless leg syndrome    Sinus drainage    Skin cancer    Skin cancer    left hand 06/2019   Urothelial cancer ([place])    chemo tx's.   UTI (lower urinary tract infection) 12/26/2014   Vertigo      Past Surgical History:  Procedure Laterality Date   COLONOSCOPY     CYSTOSCOPY W/ RETROGRADES Right 01/30/2015   Procedure: CYSTOSCOPY WITH RETROGRADE PYELOGRAM;  Surgeon: [name], MD;  Location: [facility];  Service: Urology;  Laterality: Right;   CYSTOSCOPY W/ RETROGRADES Bilateral 02/26/2016   Procedure: CYSTOSCOPY WITH RETROGRADE  PYELOGRAM;  Surgeon: [name], MD;  Location: [facility];  Service: Urology;  Laterality: Bilateral;   CYSTOSCOPY W/ URETERAL STENT PLACEMENT Right 08/20/2015   Procedure: CYSTOSCOPY WITH RETROGRADE PYELOGRAM/POSSIBLE URETERAL STENT PLACEMENT/BLADDER BIOPSY;  Surgeon: [name], MD;  Location: [facility];  Service: Urology;  Laterality: Right;   CYSTOSCOPY W/ URETERAL STENT PLACEMENT Right 09/12/2015   Procedure: CYSTOSCOPY WITH STENT REPLACEMENT;  Surgeon: [name], MD;  Location: [facility];  Service: Urology;  Laterality: Right;   CYSTOSCOPY WITH BIOPSY Right 09/12/2015   Procedure: CYSTOSCOPY WITH BLADDER AND URETERAL BIOPSY;  Surgeon: [name], MD;  Location: [facility];  Service: Urology;  Laterality: Right;   CYSTOSCOPY WITH STENT PLACEMENT Right 01/30/2015   Procedure: CYSTOSCOPY WITH STENT PLACEMENT;  Surgeon: [name], MD;  Location: [facility];  Service: Urology;  Laterality: Right;   CYSTOSCOPY WITH STENT PLACEMENT Right 12/28/2017   Procedure: CYSTOSCOPY WITH STENT Exchange;  Surgeon: [name], MD;  Location: [facility];  Service: Urology;  Laterality: Right;   CYSTOSCOPY/URETEROSCOPY/HOLMIUM LASER/STENT PLACEMENT Right 08/19/2017   Procedure: CYSTOSCOPY/URETEROSCOPY/HOLMIUM LASER/STENT PLACEMENT;  Surgeon: [name], MD;  Location: [facility];  Service: Urology;  Laterality: Right;   EYE SURGERY Bilateral    Cataract Extraction with IOL   goiter removal     HOLMIUM LASER APPLICATION N/A 2/87/8676   Procedure:  HOLMIUM LASER APPLICATION;  Surgeon: [name], MD;  Location: [facility];  Service: Urology;  Laterality: N/A;   PORTA CATH INSERTION N/A 09/23/2017   Procedure: PORTA CATH INSERTION;  Surgeon: Algernon Huxley, MD;  Location: [facility];  Service: Cardiovascular;  Laterality: N/A;   SPERMATOCELECTOMY     TONSILLECTOMY     TRANSURETHRAL RESECTION OF BLADDER TUMOR N/A 02/26/2016   Procedure: TRANSURETHRAL RESECTION OF BLADDER TUMOR (TURBT);   Surgeon: [name], MD;  Location: [facility];  Service: Urology;  Laterality: N/A;   TRANSURETHRAL RESECTION OF BLADDER TUMOR WITH MITOMYCIN-C N/A 09/12/2015   Procedure: TRANSURETHRAL RESECTION OF BLADDER TUMOR ;  Surgeon: [name], MD;  Location: [facility];  Service: Urology;  Laterality: N/A;   TRANSURETHRAL RESECTION OF BLADDER TUMOR WITH MITOMYCIN-C N/A 03/24/2016   Procedure: TRANSURETHRAL RESECTION OF BLADDER TUMOR WITH MITOMYCIN-C  (SMALL);  Surgeon: [name], MD;  Location: [facility];  Service: Urology;  Laterality: N/A;   URETERAL BIOPSY Right 08/19/2017   Procedure: Renal Mass BIOPSY;  Surgeon: [name], MD;  Location: [facility];  Service: Urology;  Laterality: Right;   URETEROSCOPY Right 01/30/2015   Procedure: URETEROSCOPY/ WITH BIOPSY AND CYTOLOGY BRUSHING;  Surgeon: [name], MD;  Location: [facility];  Service: Urology;  Laterality: Right;   URETEROSCOPY Right 08/20/2015   Procedure: URETEROSCOPY;  Surgeon: [name], MD;  Location: [facility];  Service: Urology;  Laterality: Right;   URETEROSCOPY Right 09/12/2015   Procedure: URETEROSCOPY;  Surgeon: [name], MD;  Location: [facility];  Service: Urology;  Laterality: Right;   URETEROSCOPY Right 02/26/2016   Procedure: URETEROSCOPY;  Surgeon: [name], MD;  Location: [facility];  Service: Urology;  Laterality: Right;    Social History   Socioeconomic History   Marital status: Married    Spouse name: Not on file   Number of children: Not on file   Years of education: Not on file   Highest education level: Not on file  Occupational History   Not on file  Tobacco Use   Smoking status: Never   Smokeless tobacco: Never  Vaping Use   Vaping Use: Never used  Substance and Sexual Activity   Alcohol use: No    Alcohol/week: 0.0 standard drinks of alcohol   Drug use: No   Sexual activity: Not Currently  Other Topics Concern   Not on file  Social History Narrative   Not on file   Social  Determinants of Health   Financial Resource Strain: Not on file  Food Insecurity: Not on file  Transportation Needs: Not on file  Physical Activity: Not on file  Stress: Not on file  Social Connections: Not on file  Intimate Partner Violence: Not on file    Family History  Problem Relation Age of Onset   Hypertension Mother    Hypertension Father    Prostate cancer Brother    Kidney disease Neg Hx    Kidney cancer Neg Hx    Bladder Cancer Neg Hx      Current Outpatient Medications:    acetaminophen (TYLENOL) 500 MG tablet, Take 500 mg by mouth every 6 (six) hours as needed., Disp: , Rfl:    camphor-menthol (SARNA) lotion, Apply 1 application topically as needed for itching., Disp: , Rfl:    clopidogrel (PLAVIX) 75 MG tablet, Take 1 tablet (75 mg total) by mouth daily., Disp: 30 tablet, Rfl: 0   diazepam (VALIUM) 5 MG tablet, TAKE 1 TABLET (5 MG TOTAL) BY MOUTH EVERY 12 (TWELVE) HOURS AS NEEDED FOR ANXIETY OR SLEEP, Disp: ,  Rfl:    docusate sodium (COLACE) 250 MG capsule, Take 250 mg by mouth daily., Disp: , Rfl:    fenofibrate micronized (LOFIBRA) 134 MG capsule, Take 134 mg by mouth daily before breakfast. , Disp: , Rfl:    gemfibrozil (LOPID) 600 MG tablet, Take 600 mg by mouth 2 (two) times daily before a meal., Disp: , Rfl:    Multiple Vitamin (MULTIVITAMIN WITH MINERALS) TABS tablet, Take 1 tablet by mouth daily., Disp: , Rfl:    nystatin (MYCOSTATIN/NYSTOP) powder, Apply 1 application topically 3 (three) times daily. To under arms area that has rash, Disp: 30 g, Rfl: 0   pantoprazole (PROTONIX) 20 MG tablet, Take 1 tablet (20 mg total) by mouth daily for 14 days., Disp: 14 tablet, Rfl: 0   polyethylene glycol (MIRALAX / GLYCOLAX) 17 g packet, Take 17 g by mouth daily., Disp: , Rfl:    predniSONE (DELTASONE) 10 MG tablet, Take 6 tablets (60 mg) x3 days, then take 5 tablets (50 mg) x3 days, then take 4 tablets (40 mg) x3 days, then take 3 tablets (30 mg) x3 days, then take 2  tablets (20 mg) x3 days, then take 1 tablet (10 mg) x3 days, then stop, Disp: 65 tablet, Rfl: 0   predniSONE (DELTASONE) 10 MG tablet, Take 2 tablets (20 mg total) by mouth daily with breakfast for 7 days, THEN 1 tablet (10 mg total) daily with breakfast for 7 days., Disp: 21 tablet, Rfl: 0   psyllium (METAMUCIL) 58.6 % packet, Take 1 packet by mouth daily., Disp: , Rfl:    tamsulosin (FLOMAX) 0.4 MG CAPS capsule, Take 1 capsule (0.4 mg total) by mouth daily., Disp: 90 capsule, Rfl: 3   triamcinolone cream (KENALOG) 0.1 %, Apply 1 application. topically 2 (two) times daily., Disp: , Rfl:    amLODipine (NORVASC) 5 MG tablet, , Disp: , Rfl:    aspirin 81 MG EC tablet, Take by mouth. (Patient not taking: Reported on 07/22/2021), Disp: , Rfl:    HYDROcodone-acetaminophen (NORCO/VICODIN) 5-325 MG tablet, Take 1 tablet by mouth every 6 (six) hours as needed for severe pain. (Patient not taking: Reported on 07/22/2021), Disp: 30 tablet, Rfl: 0   prochlorperazine (COMPAZINE) 10 MG tablet, Take 1 tablet (10 mg total) by mouth every 6 (six) hours as needed for nausea or vomiting. (Patient not taking: Reported on 07/01/2021), Disp: 30 tablet, Rfl: 0   senna (SENOKOT) 8.6 MG tablet, Take 1 tablet by mouth daily. (Patient not taking: Reported on 07/22/2021), Disp: , Rfl:    urea (CARMOL) 10 % cream, Apply topically 3 (three) times daily. NOT THE HANDS UNTIL IT CLEARS UP (Patient not taking: Reported on 06/10/2021), Disp: 71 g, Rfl: 0 No current facility-administered medications for this visit.  Facility-Administered Medications Ordered in Other Visits:    heparin lock flush 100 UNIT/ML injection, , , ,    heparin lock flush 100 UNIT/ML injection, , , ,    heparin lock flush 100 unit/mL, 500 Units, Intravenous, Once, Sindy Guadeloupe, MD   sodium chloride flush (NS) 0.9 % injection 10 mL, 10 mL, Intravenous, Once, Sindy Guadeloupe, MD   sodium chloride flush (NS) 0.9 % injection 10 mL, 10 mL, Intravenous, PRN, Sindy Guadeloupe, MD   sodium chloride flush (NS) 0.9 % injection 10 mL, 10 mL, Intravenous, PRN, Sindy Guadeloupe, MD, 10 mL at 08/21/21 0841  Physical exam:  Vitals:   08/21/21 0907  BP: (!) 155/89  Pulse: 86  Resp: 18  Temp: 97.6 F (36.4 C)  SpO2: 98%  Weight: 187 lb 4.8 oz (85 kg)   Physical Exam Constitutional:      General: He is not in acute distress. Cardiovascular:     Rate and Rhythm: Normal rate and regular rhythm.     Heart sounds: Normal heart sounds.  Pulmonary:     Effort: Pulmonary effort is normal.     Breath sounds: Normal breath sounds.  Skin:    General: Skin is warm and dry.     Comments: Diffuse erythema noted over bilateral upper extremities as well as chest and abdominal wall which overall looks better as compared to last week's pictures  Neurological:     Mental Status: He is alert and oriented to person, place, and time.         Latest Ref Rng & Units 08/21/2021    8:41 AM  CMP  Glucose 70 - 99 mg/dL 136   BUN 8 - 23 mg/dL 22   Creatinine 0.61 - 1.24 mg/dL 0.87   Sodium 135 - 145 mmol/L 137   Potassium 3.5 - 5.1 mmol/L 3.7   Chloride 98 - 111 mmol/L 102   CO2 22 - 32 mmol/L 26   Calcium 8.9 - 10.3 mg/dL 8.4   Total Protein 6.5 - 8.1 g/dL 6.2   Total Bilirubin 0.3 - 1.2 mg/dL 0.7   Alkaline Phos 38 - 126 U/L 74   AST 15 - 41 U/L 28   ALT 0 - 44 U/L 27       Latest Ref Rng & Units 08/21/2021    8:41 AM  CBC  WBC 4.0 - 10.5 K/uL 10.9   Hemoglobin 13.0 - 17.0 g/dL 14.9   Hematocrit 39.0 - 52.0 % 43.4   Platelets 150 - 400 K/uL 286       No results found.   Assessment and plan- Patient is a 88 y.o. male with metastatic urothelial carcinoma here for following issues:  Skin rash: Suspect secondary to Padcev.  He has had similar issues in 2021 when he was on Padcev as well.  I will continue 20 mg of prednisone for 1 more week followed by 10 mg for 1 week and then stop.  I will also give him prophylactic Protonix for 2 weeks.  He will continue  topical emollients and Benadryl.  Metastatic urothelial carcinoma.  Holding off on Padcev at this time.  I suspect his skin rash as well as blurry vision are both secondary to Padcev.  I plan to get a repeat CT chest abdomen and pelvis with contrast in 1 month's time.  Overall there is no evidence of disease progression I will give him another treatment break and consider rechallenging him with Padcev down the line upon disease progression.  Previously he could go on for a year until he had disease progression   Visit Diagnosis 1. 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 Drug-induced skin rash      Dr. Randa Evens, MD, MPH [facility] [phone] 08/21/2021 10:58 AM

## 2021-08-28 DIAGNOSIS — C679 Malignant neoplasm of bladder, unspecified: Secondary | ICD-10-CM | POA: Diagnosis not present

## 2021-08-28 DIAGNOSIS — Z515 Encounter for palliative care: Secondary | ICD-10-CM | POA: Diagnosis not present

## 2021-08-28 DIAGNOSIS — C7900 Secondary malignant neoplasm of unspecified kidney and renal pelvis: Secondary | ICD-10-CM | POA: Diagnosis not present

## 2021-08-28 DIAGNOSIS — Z6825 Body mass index (BMI) 25.0-25.9, adult: Secondary | ICD-10-CM | POA: Diagnosis not present

## 2021-08-28 DIAGNOSIS — T451X5D Adverse effect of antineoplastic and immunosuppressive drugs, subsequent encounter: Secondary | ICD-10-CM | POA: Diagnosis not present

## 2021-08-28 DIAGNOSIS — Z66 Do not resuscitate: Secondary | ICD-10-CM | POA: Diagnosis not present

## 2021-08-28 DIAGNOSIS — I10 Essential (primary) hypertension: Secondary | ICD-10-CM | POA: Diagnosis not present

## 2021-08-28 DIAGNOSIS — L270 Generalized skin eruption due to drugs and medicaments taken internally: Secondary | ICD-10-CM | POA: Diagnosis not present

## 2021-08-28 DIAGNOSIS — C7982 Secondary malignant neoplasm of genital organs: Secondary | ICD-10-CM | POA: Diagnosis not present

## 2021-08-28 DIAGNOSIS — C7801 Secondary malignant neoplasm of right lung: Secondary | ICD-10-CM | POA: Diagnosis not present

## 2021-08-29 ENCOUNTER — Telehealth: Payer: Self-pay | Admitting: *Deleted

## 2021-08-29 ENCOUNTER — Inpatient Hospital Stay (HOSPITAL_BASED_OUTPATIENT_CLINIC_OR_DEPARTMENT_OTHER): Payer: PPO | Admitting: Medical Oncology

## 2021-08-29 VITALS — BP 165/80 | HR 79 | Temp 97.8°F | Resp 16 | Wt 184.0 lb

## 2021-08-29 DIAGNOSIS — L270 Generalized skin eruption due to drugs and medicaments taken internally: Secondary | ICD-10-CM | POA: Diagnosis not present

## 2021-08-29 DIAGNOSIS — C661 Malignant neoplasm of right ureter: Secondary | ICD-10-CM | POA: Diagnosis not present

## 2021-08-29 MED ORDER — FAMOTIDINE 20 MG PO TABS
20.0000 mg | ORAL_TABLET | Freq: Two times a day (BID) | ORAL | 0 refills | Status: DC
Start: 2021-08-29 — End: 2021-09-27

## 2021-08-29 MED ORDER — PREDNISONE 10 MG PO TABS: ORAL_TABLET | ORAL | 0 refills | Status: DC

## 2021-08-29 NOTE — Telephone Encounter (Signed)
Patient scheduled for 2 PM today and accepts this appointment

## 2021-08-29 NOTE — Telephone Encounter (Signed)
Patient called reporting that the rash on his arms is no better and if fact, he thinks it may be worse. He is asking to speak with someone about it. Please advise

## 2021-08-29 NOTE — Progress Notes (Signed)
Returns for evaluation of rash. He is currently on prednisone taper. He reports that the rash initially responded to prednisone and improved some. He now reports that since decreasing down to 2 tablets, the rash has returned back to its initial state, if not a little worse.

## 2021-08-30 DIAGNOSIS — R21 Rash and other nonspecific skin eruption: Secondary | ICD-10-CM | POA: Diagnosis not present

## 2021-08-30 DIAGNOSIS — N183 Chronic kidney disease, stage 3 unspecified: Secondary | ICD-10-CM | POA: Diagnosis not present

## 2021-08-30 DIAGNOSIS — I10 Essential (primary) hypertension: Secondary | ICD-10-CM | POA: Diagnosis not present

## 2021-08-30 DIAGNOSIS — I6300 Cerebral infarction due to thrombosis of unspecified precerebral artery: Secondary | ICD-10-CM | POA: Diagnosis not present

## 2021-09-03 DIAGNOSIS — D045 Carcinoma in situ of skin of trunk: Secondary | ICD-10-CM | POA: Diagnosis not present

## 2021-09-13 ENCOUNTER — Ambulatory Visit
Admission: RE | Admit: 2021-09-13 | Discharge: 2021-09-13 | Disposition: A | Discharge status: Home / Self Care | Payer: PPO | Source: Ambulatory Visit | Attending: Oncology | Admitting: Oncology

## 2021-09-13 DIAGNOSIS — N281 Cyst of kidney, acquired: Secondary | ICD-10-CM | POA: Insufficient documentation

## 2021-09-13 DIAGNOSIS — R911 Solitary pulmonary nodule: Secondary | ICD-10-CM | POA: Diagnosis not present

## 2021-09-13 DIAGNOSIS — K862 Cyst of pancreas: Secondary | ICD-10-CM | POA: Diagnosis not present

## 2021-09-13 DIAGNOSIS — K8020 Calculus of gallbladder without cholecystitis without obstruction: Secondary | ICD-10-CM | POA: Diagnosis not present

## 2021-09-13 DIAGNOSIS — R918 Other nonspecific abnormal finding of lung field: Secondary | ICD-10-CM | POA: Diagnosis not present

## 2021-09-13 DIAGNOSIS — K5730 Diverticulosis of large intestine without perforation or abscess without bleeding: Secondary | ICD-10-CM | POA: Diagnosis not present

## 2021-09-13 DIAGNOSIS — C669 Malignant neoplasm of unspecified ureter: Secondary | ICD-10-CM | POA: Insufficient documentation

## 2021-09-13 MED ORDER — IOHEXOL 300 MG/ML  SOLN
80.0000 mL | Freq: Once | INTRAMUSCULAR | Status: AC | PRN
  Administered 2021-09-13: 80 mL via INTRAVENOUS

## 2021-09-16 ENCOUNTER — Inpatient Hospital Stay: Payer: PPO | Attending: Oncology | Admitting: Nurse Practitioner

## 2021-09-16 ENCOUNTER — Telehealth: Payer: Self-pay | Admitting: *Deleted

## 2021-09-16 VITALS — BP 133/72 | HR 79 | Temp 97.0°F | Resp 14 | Wt 185.0 lb

## 2021-09-16 DIAGNOSIS — N400 Enlarged prostate without lower urinary tract symptoms: Secondary | ICD-10-CM | POA: Diagnosis not present

## 2021-09-16 DIAGNOSIS — L299 Pruritus, unspecified: Secondary | ICD-10-CM | POA: Insufficient documentation

## 2021-09-16 DIAGNOSIS — K8020 Calculus of gallbladder without cholecystitis without obstruction: Secondary | ICD-10-CM | POA: Diagnosis not present

## 2021-09-16 DIAGNOSIS — R21 Rash and other nonspecific skin eruption: Secondary | ICD-10-CM | POA: Diagnosis not present

## 2021-09-16 DIAGNOSIS — R5383 Other fatigue: Secondary | ICD-10-CM | POA: Insufficient documentation

## 2021-09-16 DIAGNOSIS — Z885 Allergy status to narcotic agent status: Secondary | ICD-10-CM | POA: Diagnosis not present

## 2021-09-16 DIAGNOSIS — N323 Diverticulum of bladder: Secondary | ICD-10-CM | POA: Insufficient documentation

## 2021-09-16 DIAGNOSIS — C661 Malignant neoplasm of right ureter: Secondary | ICD-10-CM | POA: Insufficient documentation

## 2021-09-16 DIAGNOSIS — N281 Cyst of kidney, acquired: Secondary | ICD-10-CM | POA: Diagnosis not present

## 2021-09-16 DIAGNOSIS — Z87442 Personal history of urinary calculi: Secondary | ICD-10-CM | POA: Diagnosis not present

## 2021-09-16 DIAGNOSIS — Z79899 Other long term (current) drug therapy: Secondary | ICD-10-CM | POA: Diagnosis not present

## 2021-09-16 DIAGNOSIS — N62 Hypertrophy of breast: Secondary | ICD-10-CM | POA: Insufficient documentation

## 2021-09-16 DIAGNOSIS — Z85828 Personal history of other malignant neoplasm of skin: Secondary | ICD-10-CM | POA: Diagnosis not present

## 2021-09-16 DIAGNOSIS — Z882 Allergy status to sulfonamides status: Secondary | ICD-10-CM | POA: Diagnosis not present

## 2021-09-16 DIAGNOSIS — L270 Generalized skin eruption due to drugs and medicaments taken internally: Secondary | ICD-10-CM | POA: Diagnosis not present

## 2021-09-16 DIAGNOSIS — Z87440 Personal history of urinary (tract) infections: Secondary | ICD-10-CM | POA: Insufficient documentation

## 2021-09-16 MED ORDER — PREDNISONE 10 MG PO TABS: 50.0000 mg | ORAL_TABLET | Freq: Every day | ORAL | 0 refills | Status: DC

## 2021-09-16 MED ORDER — HYDROXYZINE HCL 10 MG PO TABS: 10.0000 mg | ORAL_TABLET | Freq: Three times a day (TID) | ORAL | 0 refills | Status: DC | PRN

## 2021-09-16 NOTE — Patient Instructions (Signed)
Cerave, Eucerin, or aquaphor

## 2021-09-16 NOTE — Telephone Encounter (Signed)
Spoke to patient and scheduled appointment in [facility] today.

## 2021-09-16 NOTE — Progress Notes (Signed)
Pt recently completed prednisone taper for recurrent rash. He completed taper on 7/4. He reports that rash started to worsen again over the weekend. He reports that he stopped Plavix and has been off of it since his last visit with [place]. His PCP advised him to stop plavix and start ASA 325mg  daily.

## 2021-09-16 NOTE — Progress Notes (Addendum)
Symptom Management [place] [facility] [place]. [facility] 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[place])  09/15/2017 Cancer Staging   Staging form: Renal Pelvis and Ureter, AJCC 8th Edition - Clinical stage from 09/15/2017: Stage IV (cT2, cN0, cM1) - Signed by Sindy Guadeloupe, MD on 09/17/2017   09/17/2017 Initial Diagnosis   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[place])   09/22/2017 - 05/26/2018 Chemotherapy   The patient had dexamethasone (DECADRON) 4 MG tablet, 8 mg, Oral, Daily, 1 of 1 cycle, Start date: 05/11/2018, End date: 06/06/2018 palonosetron (ALOXI) injection 0.25 mg, 0.25 mg, Intravenous,  Once, 9 of 10 cycles Administration: 0.25 mg (09/22/2017), 0.25 mg (10/06/2017), 0.25 mg (10/20/2017), 0.25 mg (11/03/2017), 0.25 mg (11/17/2017), 0.25 mg (12/01/2017), 0.25 mg (12/15/2017), 0.25 mg (12/29/2017), 0.25 mg (01/12/2018), 0.25 mg (01/26/2018), 0.25 mg (02/09/2018), 0.25 mg (02/23/2018), 0.25 mg (03/16/2018), 0.25 mg (04/13/2018), 0.25 mg (05/11/2018) pegfilgrastim-cbqv (UDENYCA) injection 6 mg, 6 mg, Subcutaneous, Once, 5 of 6 cycles Administration: 6 mg (01/13/2018), 6 mg (02/10/2018), 6 mg (03/17/2018), 6 mg (04/15/2018), 6 mg (05/12/2018), 6 mg (05/26/2018) CARBOplatin (PARAPLATIN) 160 mg in sodium chloride 0.9 % 250 mL chemo infusion, 160 mg (100 % of  original dose 163.2 mg), Intravenous,  Once, 9 of 10 cycles Dose modification:   (original dose 163.2 mg, Cycle 1) Administration: 160 mg (09/22/2017), 170 mg (10/06/2017), 170 mg (10/20/2017), 170 mg (11/17/2017), 160 mg (12/01/2017), 170 mg (11/03/2017), 160 mg (12/15/2017), 160 mg (12/29/2017), 180 mg (01/12/2018), 180 mg (01/26/2018), 160 mg (02/09/2018), 160 mg (02/23/2018), 160 mg (03/16/2018), 160 mg (03/30/2018), 160 mg (04/13/2018), 180 mg (04/27/2018), 180 mg (05/11/2018), 160 mg (05/25/2018) gemcitabine (GEMZAR) 1,600 mg in sodium chloride 0.9 % 250 mL chemo infusion, 1,672 mg (100 % of original dose 800 mg/m2), Intravenous,  Once, 9 of 10 cycles Dose modification: 800 mg/m2 (original dose 800 mg/m2, Cycle 1, Reason: Patient Age) Administration: 1,600 mg (09/22/2017), 1,600 mg (10/06/2017), 1,600 mg (10/20/2017), 1,600 mg (11/03/2017), 1,600 mg (11/17/2017), 1,600 mg (12/01/2017), 1,600 mg (12/15/2017), 1,600 mg (12/29/2017), 1,600 mg (01/12/2018), 1,600 mg (01/26/2018), 1,600 mg (02/09/2018), 1,600 mg (02/23/2018), 1,600 mg (03/16/2018), 1,600 mg (03/30/2018), 1,600 mg (04/13/2018), 1,600 mg (04/27/2018), 1,600 mg (05/11/2018), 1,600 mg (05/25/2018)  for chemotherapy treatment.    06/08/2018 - 12/21/2018 Chemotherapy   The patient had pembrolizumab (KEYTRUDA) 200 mg in sodium chloride 0.9 % 50 mL chemo infusion, 200 mg, Intravenous, Once, 10 of 11 cycles Administration: 200 mg (06/08/2018), 200 mg (06/29/2018), 200 mg (07/20/2018), 200 mg (08/13/2018), 200 mg (09/03/2018), 200 mg (09/24/2018), 200 mg (10/19/2018), 200 mg (11/09/2018), 200 mg (11/30/2018), 200 mg (12/21/2018)  for chemotherapy treatment.    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 He has been for rash previously. Rash  worsened when he started plavix and he questions relationship. He was given prednisone taper recently but ran out. Since that time, rash reappeared and has worsened. Increased itching interfering with sleep. No response to topicals. Describes reddening of arms and now to axilla and spots appearing on torso. Rash is confluent.   Review of systems- Review of Systems  Constitutional:  Positive for malaise/fatigue. Negative for chills, fever and weight loss.  HENT:  Negative for hearing loss, nosebleeds, sore throat and tinnitus.   Eyes:  Negative for blurred vision and double vision.  Respiratory:  Negative for cough, hemoptysis, shortness of breath and wheezing.   Cardiovascular:  Negative for chest pain, palpitations and leg swelling.  Gastrointestinal:  Negative for abdominal pain, blood in stool, constipation, diarrhea, melena, nausea and vomiting.  Genitourinary:  Negative for dysuria and urgency.  Musculoskeletal:  Negative for back pain, falls, joint pain and myalgias.  Skin:  Positive for itching and rash.  Neurological:  Negative for dizziness, tingling, sensory change, loss of consciousness, weakness and headaches.  Endo/Heme/Allergies:  Negative for environmental allergies. Does not bruise/bleed easily.  Psychiatric/Behavioral:  Negative for depression. The patient has insomnia. The patient is not nervous/anxious.       Allergies  Allergen Reactions   Demerol [Meperidine] Nausea And Vomiting   Lipitor [Atorvastatin] Swelling   Sulfa Antibiotics Nausea And Vomiting and Rash    Past Medical History:  Diagnosis Date   Arthritis    Benign fibroma of prostate 08/23/2013   BPH (benign prostatic hyperplasia)    Calculus of kidney 08/23/2013   Glaucoma    no drops in 3 mo pressure good, pt denies glaucoma, eye pressure has been measuring alright.   History of kidney stones    HLD (hyperlipidemia)    HOH (hard of hearing)    Left Hearing Aid   HTN (hypertension) 12/26/2014    Hypertension    Hyponatremia 12/26/2014   Migraines    history of migraines when he was younger.   Restless leg syndrome    Sinus drainage    Skin cancer    Skin cancer    left hand 06/2019   Urothelial cancer ([place])    chemo tx's.   UTI (lower urinary tract infection) 12/26/2014   Vertigo     Past Surgical History:  Procedure Laterality Date   COLONOSCOPY     CYSTOSCOPY W/ RETROGRADES Right 01/30/2015   Procedure: CYSTOSCOPY WITH RETROGRADE PYELOGRAM;  Surgeon: [name], MD;  Location: [facility];  Service: Urology;  Laterality: Right;   CYSTOSCOPY W/ RETROGRADES Bilateral 02/26/2016   Procedure: CYSTOSCOPY WITH RETROGRADE PYELOGRAM;  Surgeon: [name], MD;  Location: [facility];  Service: Urology;  Laterality: Bilateral;   CYSTOSCOPY W/ URETERAL STENT PLACEMENT Right 08/20/2015   Procedure: CYSTOSCOPY WITH RETROGRADE PYELOGRAM/POSSIBLE URETERAL STENT PLACEMENT/BLADDER BIOPSY;  Surgeon: [name], MD;  Location: [facility];  Service: Urology;  Laterality: Right;   CYSTOSCOPY W/ URETERAL STENT PLACEMENT Right 09/12/2015   Procedure: CYSTOSCOPY WITH STENT REPLACEMENT;  Surgeon: [name], MD;  Location: [facility];  Service: Urology;  Laterality: Right;   CYSTOSCOPY WITH BIOPSY Right 09/12/2015   Procedure: CYSTOSCOPY WITH BLADDER AND URETERAL BIOPSY;  Surgeon: [name], MD;  Location: [facility];  Service: Urology;  Laterality: Right;   CYSTOSCOPY WITH STENT PLACEMENT Right 01/30/2015   Procedure: CYSTOSCOPY WITH STENT PLACEMENT;  Surgeon: [name], MD;  Location: [facility];  Service: Urology;  Laterality: Right;   CYSTOSCOPY WITH STENT PLACEMENT Right 12/28/2017   Procedure: CYSTOSCOPY WITH STENT Exchange;  Surgeon: [name], MD;  Location: [facility];  Service: Urology;  Laterality: Right;   CYSTOSCOPY/URETEROSCOPY/HOLMIUM LASER/STENT PLACEMENT Right 08/19/2017   Procedure: CYSTOSCOPY/URETEROSCOPY/HOLMIUM LASER/STENT PLACEMENT;  Surgeon: [name], MD;   Location: [facility];  Service: Urology;  Laterality: Right;   EYE SURGERY Bilateral    Cataract Extraction with IOL   goiter removal     HOLMIUM LASER APPLICATION N/A 06/16/8117   Procedure:  HOLMIUM LASER APPLICATION;  Surgeon: [name], MD;  Location: [facility];  Service: Urology;  Laterality: N/A;   PORTA CATH INSERTION N/A 09/23/2017   Procedure: PORTA CATH INSERTION;  Surgeon: Algernon Huxley, MD;  Location: [facility];  Service: Cardiovascular;  Laterality: N/A;   SPERMATOCELECTOMY     TONSILLECTOMY     TRANSURETHRAL RESECTION OF BLADDER TUMOR N/A 02/26/2016   Procedure: TRANSURETHRAL RESECTION OF BLADDER TUMOR (TURBT);  Surgeon: [name], MD;  Location: [facility];  Service: Urology;  Laterality: N/A;   TRANSURETHRAL RESECTION OF BLADDER TUMOR WITH MITOMYCIN-C N/A 09/12/2015   Procedure: TRANSURETHRAL RESECTION OF BLADDER TUMOR ;  Surgeon: [name], MD;  Location: [facility];  Service: Urology;  Laterality: N/A;   TRANSURETHRAL RESECTION OF BLADDER TUMOR WITH MITOMYCIN-C N/A 03/24/2016   Procedure: TRANSURETHRAL RESECTION OF BLADDER TUMOR WITH MITOMYCIN-C  (SMALL);  Surgeon: [name], MD;  Location: [facility];  Service: Urology;  Laterality: N/A;   URETERAL BIOPSY Right 08/19/2017   Procedure: Renal Mass BIOPSY;  Surgeon: [name], MD;  Location: [facility];  Service: Urology;  Laterality: Right;   URETEROSCOPY Right 01/30/2015   Procedure: URETEROSCOPY/ WITH BIOPSY AND CYTOLOGY BRUSHING;  Surgeon: [name], MD;  Location: [facility];  Service: Urology;  Laterality: Right;   URETEROSCOPY Right 08/20/2015   Procedure: URETEROSCOPY;  Surgeon: [name], MD;  Location: [facility];  Service: Urology;  Laterality: Right;   URETEROSCOPY Right 09/12/2015   Procedure: URETEROSCOPY;  Surgeon: [name], MD;  Location: [facility];  Service: Urology;  Laterality: Right;   URETEROSCOPY Right 02/26/2016   Procedure: URETEROSCOPY;  Surgeon: [name], MD;   Location: [facility];  Service: Urology;  Laterality: Right;    Social History   Socioeconomic History   Marital status: Married    Spouse name: Not on file   Number of children: Not on file   Years of education: Not on file   Highest education level: Not on file  Occupational History   Not on file  Tobacco Use   Smoking status: Never   Smokeless tobacco: Never  Vaping Use   Vaping Use: Never used  Substance and Sexual Activity   Alcohol use: No    Alcohol/week: 0.0 standard drinks of alcohol   Drug use: No   Sexual activity: Not Currently  Other Topics Concern   Not on file  Social History Narrative   Not on file   Social Determinants of Health   Financial Resource Strain: Not on file  Food Insecurity: Not on file  Transportation Needs: Not on file  Physical Activity: Not on file  Stress: Not on file  Social Connections: Not on file  Intimate Partner Violence: Not on file    Family History  Problem Relation Age of Onset   Hypertension Mother    Hypertension Father    Prostate cancer Brother    Kidney disease Neg Hx    Kidney  cancer Neg Hx    Bladder Cancer Neg Hx      Current Outpatient Medications:    aspirin EC 325 MG tablet, Take 325 mg by mouth daily., Disp: , Rfl:    acetaminophen (TYLENOL) 500 MG tablet, Take 500 mg by mouth every 6 (six) hours as needed., Disp: , Rfl:    amLODipine (NORVASC) 5 MG tablet, , Disp: , Rfl:    camphor-menthol (SARNA) lotion, Apply 1 application topically as needed for itching., Disp: , Rfl:    diazepam (VALIUM) 5 MG tablet, TAKE 1 TABLET (5 MG TOTAL) BY MOUTH EVERY 12 (TWELVE) HOURS AS NEEDED FOR ANXIETY OR SLEEP, Disp: , Rfl:    docusate sodium (COLACE) 250 MG capsule, Take 250 mg by mouth daily., Disp: , Rfl:    famotidine (PEPCID) 20 MG tablet, Take 1 tablet (20 mg total) by mouth 2 (two) times daily., Disp: 14 tablet, Rfl: 0   fenofibrate micronized (LOFIBRA) 134 MG capsule, Take 134 mg by mouth daily before breakfast. ,  Disp: , Rfl:    gemfibrozil (LOPID) 600 MG tablet, Take 600 mg by mouth 2 (two) times daily before a meal., Disp: , Rfl:    HYDROcodone-acetaminophen (NORCO/VICODIN) 5-325 MG tablet, Take 1 tablet by mouth every 6 (six) hours as needed for severe pain. (Patient not taking: Reported on 07/22/2021), Disp: 30 tablet, Rfl: 0   Multiple Vitamin (MULTIVITAMIN WITH MINERALS) TABS tablet, Take 1 tablet by mouth daily., Disp: , Rfl:    nystatin (MYCOSTATIN/NYSTOP) powder, Apply 1 application topically 3 (three) times daily. To under arms area that has rash, Disp: 30 g, Rfl: 0   pantoprazole (PROTONIX) 20 MG tablet, Take 1 tablet (20 mg total) by mouth daily for 14 days., Disp: 14 tablet, Rfl: 0   polyethylene glycol (MIRALAX / GLYCOLAX) 17 g packet, Take 17 g by mouth daily., Disp: , Rfl:    predniSONE (DELTASONE) 10 MG tablet, Take 6 tablets (60 mg) x3 days, then take 5 tablets (50 mg) x3 days, then take 4 tablets (40 mg) x3 days, then take 3 tablets (30 mg) x3 days, then take 2 tablets (20 mg) x3 days, then take 1 tablet (10 mg) x3 days, then stop, Disp: 65 tablet, Rfl: 0   predniSONE (DELTASONE) 10 MG tablet, Take 2 tablets in the morning and 1 tablet in the evening with food and water. Make sure to monitor blood pressure. May increase to 2 tablets in the morning and 2 tablets at night if BP is below 150/80., Disp: 30 tablet, Rfl: 0   prochlorperazine (COMPAZINE) 10 MG tablet, Take 1 tablet (10 mg total) by mouth every 6 (six) hours as needed for nausea or vomiting. (Patient not taking: Reported on 07/01/2021), Disp: 30 tablet, Rfl: 0   psyllium (METAMUCIL) 58.6 % packet, Take 1 packet by mouth daily., Disp: , Rfl:    senna (SENOKOT) 8.6 MG tablet, Take 1 tablet by mouth daily. (Patient not taking: Reported on 07/22/2021), Disp: , Rfl:    tamsulosin (FLOMAX) 0.4 MG CAPS capsule, Take 1 capsule (0.4 mg total) by mouth daily., Disp: 90 capsule, Rfl: 3   triamcinolone cream (KENALOG) 0.1 %, Apply 1 application.  topically 2 (two) times daily., Disp: , Rfl:    urea (CARMOL) 10 % cream, Apply topically 3 (three) times daily. NOT THE HANDS UNTIL IT CLEARS UP (Patient not taking: Reported on 06/10/2021), Disp: 71 g, Rfl: 0 No current facility-administered medications for this visit.  Facility-Administered Medications Ordered in Other Visits:  heparin lock flush 100 UNIT/ML injection, , , ,    heparin lock flush 100 UNIT/ML injection, , , ,    heparin lock flush 100 unit/mL, 500 Units, Intravenous, Once, Sindy Guadeloupe, MD   sodium chloride flush (NS) 0.9 % injection 10 mL, 10 mL, Intravenous, Once, Sindy Guadeloupe, MD   sodium chloride flush (NS) 0.9 % injection 10 mL, 10 mL, Intravenous, PRN, Sindy Guadeloupe, MD   sodium chloride flush (NS) 0.9 % injection 10 mL, 10 mL, Intravenous, PRN, Sindy Guadeloupe, MD, 10 mL at 08/21/21 0841  Physical exam:  Vitals:   09/16/21 1110  BP: 133/72  Pulse: 79  Resp: 14  Temp: (!) 97 F (36.1 C)  TempSrc: Tympanic  SpO2: 96%  Weight: 185 lb (83.9 kg)   Physical Exam Constitutional:      Appearance: He is not ill-appearing.  Skin:    General: Skin is dry.     Findings: Rash (reddened arms from fingers, up to shoulders. scattered redness of axilla and torso. dry appearing skin. pruritic.) present.  Neurological:     Mental Status: He is alert.  Psychiatric:        Mood and Affect: Mood normal.        Behavior: Behavior normal.         Latest Ref Rng & Units 08/21/2021    8:41 AM  CMP  Glucose 70 - 99 mg/dL 136   BUN 8 - 23 mg/dL 22   Creatinine 0.61 - 1.24 mg/dL 0.87   Sodium 135 - 145 mmol/L 137   Potassium 3.5 - 5.1 mmol/L 3.7   Chloride 98 - 111 mmol/L 102   CO2 22 - 32 mmol/L 26   Calcium 8.9 - 10.3 mg/dL 8.4   Total Protein 6.5 - 8.1 g/dL 6.2   Total Bilirubin 0.3 - 1.2 mg/dL 0.7   Alkaline Phos 38 - 126 U/L 74   AST 15 - 41 U/L 28   ALT 0 - 44 U/L 27       Latest Ref Rng & Units 08/21/2021    8:41 AM  CBC  WBC 4.0 - 10.5 K/uL 10.9    Hemoglobin 13.0 - 17.0 g/dL 14.9   Hematocrit 39.0 - 52.0 % 43.4   Platelets 150 - 400 K/uL 286     No images are attached to the encounter.  CT CHEST ABDOMEN PELVIS W CONTRAST  Result Date: 09/14/2021 CLINICAL DATA:  Restaging metastatic ureteral carcinoma. EXAM: CT CHEST, ABDOMEN, AND PELVIS WITH CONTRAST TECHNIQUE: Multidetector CT imaging of the chest, abdomen and pelvis was performed following the standard protocol during bolus administration of intravenous contrast. RADIATION DOSE REDUCTION: This exam was performed according to the departmental dose-optimization program which includes automated exposure control, adjustment of the mA and/or kV according to patient size and/or use of iterative reconstruction technique. CONTRAST:  87mL OMNIPAQUE IOHEXOL 300 MG/ML  SOLN COMPARISON:  Numerous previous imaging studies. The most recent examination is a chest CT from 07/15/2021. Prior CT scan the abdomen and pelvis is from 04/05/2021. FINDINGS: CT CHEST FINDINGS Cardiovascular: The heart is normal in size. No pericardial effusion. Small amount of fluid in the pericardial recesses is stable. Stable aortic and coronary artery calcifications. No aortic aneurysm dissection. Mediastinum/Nodes: Stable scattered mediastinal and hilar lymph nodes. No mass or overt adenopathy. Esophagus is grossly normal. Lungs/Pleura: Stable slightly lobulated lesion in the right upper lobe measuring 12 x 10 mm. Several peripheral/subpleural sub 5 mm pulmonary nodules are stable.  No new or progressive findings. No acute pulmonary process. No pleural effusions or pleural nodules. Musculoskeletal: Stable mild bilateral gynecomastia. No supraclavicular or axillary adenopathy. The bony thorax is intact. CT ABDOMEN PELVIS FINDINGS Hepatobiliary: No findings for hepatic metastatic disease. Small layering gallstones again noted in the gallbladder. The common bile duct is normal in caliber. Pancreas: No mass, inflammation or ductal  dilatation. Two stable simple 10 mm cysts in the pancreatic head. Spleen: Normal size. No focal lesions. Adrenals/Urinary Tract: The adrenal glands and kidneys are unremarkable and stable. Stable simple renal cysts. These do not require follow-up. The delayed images do not demonstrate any significant collecting system abnormalities. The upper ureters are unremarkable. The bladder is unremarkable. Stable small left-sided posterior bladder diverticulum. Stomach/Bowel: The stomach, duodenum, small and colon are unremarkable. No acute inflammatory changes, mass lesions or obstructive findings. The terminal ileum and appendix are normal. Stable sigmoid colon diverticulosis. Vascular/Lymphatic: The aorta is normal in caliber. No dissection. The branch vessels are patent. The major venous structures are patent. No mesenteric or retroperitoneal mass or adenopathy. Small scattered lymph nodes are noted. Reproductive: The prostate gland is mildly enlarged. The seminal vesicles are unremarkable. Other: No pelvic mass. 15 x 13 mm right internal iliac lymph node noted on image number 116/2. This previously measured 21 x 15 mm. No free pelvic fluid collections. No inguinal mass or adenopathy. No abdominal wall hernia or subcutaneous lesions. Musculoskeletal: No significant bony findings. IMPRESSION: 1. Stable right upper lobe pulmonary nodule. Other small scattered sub 5 mm nodules are stable also. No new or progressive findings. 2. Stable scattered mediastinal and hilar lymph nodes. 3. No findings for abdominal/pelvic metastatic disease. 4. Continued regression of the right pelvic lymph node. 5. Cholelithiasis. 6. Stable pancreatic and renal cysts. * Tracking Code: BO * Electronically Signed   By: Marijo Sanes M.D.   On: 09/14/2021 14:34    Assessment and plan- Patient is a 88 y.o. male   Rash- likely related to padcev. Failed steroid taper. Start prednisone 50 mg x 7 days, decrease to 40 mg x 7. Dr. Janese Banks will continue  planned taper estimating to decrease by 10 mg weekly. Advised patient to not abruptly stop steroids and he may require extended course of steroids. Will minimize prednisone to lowest effective dose. If symptoms recur or worsen, plan to step up dosing. Encouraged him to see Dr. Evorn Gong for evaluation as well. Appreciate dermatology's input. I will reach out to Dr. Roosvelt Harps office to see if patient can be evaluated to possibly restart methotrexate. Will also start atarax 10 mg tablet q8h as needed for itching. Encouraged topical emollient to help minimize skin dryness and keep skin moisturized.   Follow up with Dr. Janese Banks in 2 weeks. Return to clinic as needed or if symptoms don't improve or worsen in the interim.   Visit Diagnosis 1. Drug-induced skin rash   2. Pruritus    Patient expressed understanding and was in agreement with this plan. He also understands that He can call clinic at any time with any questions, concerns, or complaints.   Thank you for allowing me to participate in the care of this very pleasant patient.   Beckey Rutter, DNP, AGNP-[facility]  CC: Dr. Evorn Gong, Dr. Janese Banks

## 2021-09-16 NOTE — Telephone Encounter (Signed)
Call returned to patient after he left triage message regarding rash. Patient reports that rash to arms is worsening, rash now to chest. He reports itching and some bleeding from rash to arms yesterday. He reports taking 40 mg of prednisone daily x 3 days and then tapered to 30 mg daily until 7/4, He has been off prednisone since 7/4 due to being out of medication. He is requesting visit for evaluation. Please advise.

## 2021-09-16 NOTE — Telephone Encounter (Signed)
Can you please see him. Also get in touch with dr dasher. Thanks

## 2021-09-20 ENCOUNTER — Other Ambulatory Visit: Payer: PPO

## 2021-09-20 ENCOUNTER — Ambulatory Visit: Payer: PPO | Admitting: Oncology

## 2021-09-27 ENCOUNTER — Encounter: Payer: Self-pay | Admitting: Oncology

## 2021-09-27 ENCOUNTER — Inpatient Hospital Stay (HOSPITAL_BASED_OUTPATIENT_CLINIC_OR_DEPARTMENT_OTHER): Payer: PPO | Admitting: Oncology

## 2021-09-27 ENCOUNTER — Inpatient Hospital Stay: Payer: PPO

## 2021-09-27 VITALS — BP 159/75 | HR 66 | Temp 98.0°F | Resp 16 | Ht 72.0 in | Wt 185.0 lb

## 2021-09-27 DIAGNOSIS — C669 Malignant neoplasm of unspecified ureter: Secondary | ICD-10-CM

## 2021-09-27 DIAGNOSIS — L270 Generalized skin eruption due to drugs and medicaments taken internally: Secondary | ICD-10-CM | POA: Diagnosis not present

## 2021-09-27 DIAGNOSIS — Z7189 Other specified counseling: Secondary | ICD-10-CM | POA: Diagnosis not present

## 2021-09-27 DIAGNOSIS — C661 Malignant neoplasm of right ureter: Secondary | ICD-10-CM | POA: Diagnosis not present

## 2021-09-27 LAB — COMPREHENSIVE METABOLIC PANEL WITH GFR
ALT: 31 U/L (ref 0–44)
AST: 29 U/L (ref 15–41)
Albumin: 3.5 g/dL (ref 3.5–5.0)
Alkaline Phosphatase: 89 U/L (ref 38–126)
Anion gap: 9 (ref 5–15)
BUN: 26 mg/dL — ABNORMAL HIGH (ref 8–23)
CO2: 26 mmol/L (ref 22–32)
Calcium: 8.7 mg/dL — ABNORMAL LOW (ref 8.9–10.3)
Chloride: 100 mmol/L (ref 98–111)
Creatinine, Ser: 0.9 mg/dL (ref 0.61–1.24)
GFR, Estimated: >60 mL/min
Glucose, Bld: 131 mg/dL — ABNORMAL HIGH (ref 70–99)
Potassium: 4.6 mmol/L (ref 3.5–5.1)
Sodium: 135 mmol/L (ref 135–145)
Total Bilirubin: 0.9 mg/dL (ref 0.3–1.2)
Total Protein: 6.5 g/dL (ref 6.5–8.1)

## 2021-09-27 LAB — CBC WITH DIFFERENTIAL/PLATELET
Abs Immature Granulocytes: 0.19 K/uL — ABNORMAL HIGH (ref 0.00–0.07)
Basophils Absolute: 0.1 K/uL (ref 0.0–0.1)
Basophils Relative: 1 %
Eosinophils Absolute: 0 K/uL (ref 0.0–0.5)
Eosinophils Relative: 0 %
HCT: 46.3 % (ref 39.0–52.0)
Hemoglobin: 15.9 g/dL (ref 13.0–17.0)
Immature Granulocytes: 2 %
Lymphocytes Relative: 12 %
Lymphs Abs: 1.4 K/uL (ref 0.7–4.0)
MCH: 31.1 pg (ref 26.0–34.0)
MCHC: 34.3 g/dL (ref 30.0–36.0)
MCV: 90.4 fL (ref 80.0–100.0)
Monocytes Absolute: 0.6 K/uL (ref 0.1–1.0)
Monocytes Relative: 5 %
Neutro Abs: 9.2 K/uL — ABNORMAL HIGH (ref 1.7–7.7)
Neutrophils Relative %: 80 %
Platelets: 320 K/uL (ref 150–400)
RBC: 5.12 MIL/uL (ref 4.22–5.81)
RDW: 15 % (ref 11.5–15.5)
WBC: 11.5 K/uL — ABNORMAL HIGH (ref 4.0–10.5)
nRBC: 0 % (ref 0.0–0.2)

## 2021-09-27 NOTE — Progress Notes (Signed)
Pt still on prednisone and down to 5 mg tablet. Feeling better but did have arms welling and was given cream OTC and it has made it better. Sometimes still gets rash under the under arms of both sides

## 2021-09-27 NOTE — Progress Notes (Signed)
Hematology/Oncology Consult note [facility]  Telephone:([phone] [phone]  Patient Care Team: Idelle Crouch, MD as PCP - General (Internal Medicine) Sindy Guadeloupe, MD as Consulting Physician (Hematology and Oncology) Sindy Guadeloupe, MD as Consulting Physician (Hematology and Oncology) Sindy Guadeloupe, MD as Consulting Physician (Hematology and Oncology)   Name of the patient: Anthony Gonzales  [id number]  [birth year]   Date of visit: 09/27/21  Diagnosis- Metastatic urothelial carcinoma with possible mets to the obturator node. Indeterminate hilar lymph nodes and LUL lung nodule    Chief complaint/ Reason for visit-discuss CT scan results and further management  Heme/Onc history: patient is a 88 year old male with past medical history significant for hypertension and long-standing history of superficial bladder cancer for which he sees [name].  He has undergone TURBT as well as ureteroscopy since 2017 along with mitomycin as well in the past.  Most recently he underwent CT abdomen on 08/06/2017 which showed a soft tissue mass in the right renal pelvis concerning for upper tract urothelial neoplasm.  Soft tissue fullness at the right ureterovesical junction with proximal right hydroureteronephrosis.  Several millimeter attenuation lesion in the pancreatic head.   He underwent diagnostic ureteroscopy and was found to have 2 high-grade lesions within his right kidney.  There was a nodular high-grade appearing lesion in the right anterior renal pelvis.  There was also a second ureteral tumor fungating from the right ureteral orifice extending into the distal ureter also consistent with high-grade invasive urothelial carcinoma.  Muscle invasion could not be assessed.  He also underwent a CT chest which showed a rounded nodule in the right upper lobe measuring 14 mm concerning for metastases.  2 other 3 mm lesions were also noted in the left upper  lobe likely benign   Plan initially was neoadjuvant chemotherapy followed by possible surgery but given the presence of lung lesion patient has been referred to oncology for the same.   PET/CT on 09/21/17 showed: IMPRESSION: 1. Hypermetabolic right obturator lymph node is most indicative of metastatic disease. 2. Mildly hypermetabolic left hilar lymph nodes are nonspecific. Continued attention on follow-up exams is warranted. 3. Right upper lobe pulmonary nodule shows metabolism after just above blood pool and is therefore indeterminate. Continued attention on follow-up exams is warranted. 4. Aortic atherosclerosis (ICD10-170.0). Coronary artery calcification. 5. Cholelithiasis    Carboplatin/ gemzar 1 week on and one-week off as patient could not tolerate 2-week on and one week off regimen.  Cycle 1 started on 09/22/2017 Disease progression in March 2020.  Switched to second [employer]   Patient had disease controlled with Keytruda.  However he was noted to have worsening dermatitis with constant flareups requiring steroids.  Beryle Flock is therefore being kept on hold and patient will be switched to third line Padcev.    Treatment has been on hold since February 2021 due to constant flareup of skin rash   Scans in October 2022 showed disease progression with new lung nodules as well as pelvic sidewall adenopathy.  Padcev restarted in November 2022.  Skin rash and blurry vision secondary to Padcev which was put on hold starting May 2023  Interval history-patient is currently on a steroid taper and is down to 5 mg of prednisone.  Skin rash over his bilateral forearms has improved.  Denies other complaints at this time.  Blurry vision has improved and he is able to see the road signs better.  ECOG PS- 1 Pain scale- 0  Review of systems- Review of Systems  Constitutional:  Negative for chills, fever, malaise/fatigue and weight loss.  HENT:  Negative for congestion, ear discharge and  nosebleeds.   Eyes:  Negative for blurred vision.  Respiratory:  Negative for cough, hemoptysis, sputum production, shortness of breath and wheezing.   Cardiovascular:  Negative for chest pain, palpitations, orthopnea and claudication.  Gastrointestinal:  Negative for abdominal pain, blood in stool, constipation, diarrhea, heartburn, melena, nausea and vomiting.  Genitourinary:  Negative for dysuria, flank pain, frequency, hematuria and urgency.  Musculoskeletal:  Negative for back pain, joint pain and myalgias.  Skin:  Negative for rash.  Neurological:  Negative for dizziness, tingling, focal weakness, seizures, weakness and headaches.  Endo/Heme/Allergies:  Does not bruise/bleed easily.  Psychiatric/Behavioral:  Negative for depression and suicidal ideas. The patient does not have insomnia.       Allergies  Allergen Reactions   Demerol [Meperidine] Nausea And Vomiting   Lipitor [Atorvastatin] Swelling   Sulfa Antibiotics Nausea And Vomiting and Rash     Past Medical History:  Diagnosis Date   Arthritis    Benign fibroma of prostate 08/23/2013   BPH (benign prostatic hyperplasia)    Calculus of kidney 08/23/2013   Glaucoma    no drops in 3 mo pressure good, pt denies glaucoma, eye pressure has been measuring alright.   History of kidney stones    HLD (hyperlipidemia)    HOH (hard of hearing)    Left Hearing Aid   HTN (hypertension) 12/26/2014   Hypertension    Hyponatremia 12/26/2014   Migraines    history of migraines when he was younger.   Restless leg syndrome    Sinus drainage    Skin cancer    Skin cancer    left hand 06/2019   Urothelial cancer ([place])    chemo tx's.   UTI (lower urinary tract infection) 12/26/2014   Vertigo      Past Surgical History:  Procedure Laterality Date   COLONOSCOPY     CYSTOSCOPY W/ RETROGRADES Right 01/30/2015   Procedure: CYSTOSCOPY WITH RETROGRADE PYELOGRAM;  Surgeon: [name], MD;  Location: [facility];  Service: Urology;   Laterality: Right;   CYSTOSCOPY W/ RETROGRADES Bilateral 02/26/2016   Procedure: CYSTOSCOPY WITH RETROGRADE PYELOGRAM;  Surgeon: [name], MD;  Location: [facility];  Service: Urology;  Laterality: Bilateral;   CYSTOSCOPY W/ URETERAL STENT PLACEMENT Right 08/20/2015   Procedure: CYSTOSCOPY WITH RETROGRADE PYELOGRAM/POSSIBLE URETERAL STENT PLACEMENT/BLADDER BIOPSY;  Surgeon: [name], MD;  Location: [facility];  Service: Urology;  Laterality: Right;   CYSTOSCOPY W/ URETERAL STENT PLACEMENT Right 09/12/2015   Procedure: CYSTOSCOPY WITH STENT REPLACEMENT;  Surgeon: [name], MD;  Location: [facility];  Service: Urology;  Laterality: Right;   CYSTOSCOPY WITH BIOPSY Right 09/12/2015   Procedure: CYSTOSCOPY WITH BLADDER AND URETERAL BIOPSY;  Surgeon: [name], MD;  Location: [facility];  Service: Urology;  Laterality: Right;   CYSTOSCOPY WITH STENT PLACEMENT Right 01/30/2015   Procedure: CYSTOSCOPY WITH STENT PLACEMENT;  Surgeon: [name], MD;  Location: [facility];  Service: Urology;  Laterality: Right;   CYSTOSCOPY WITH STENT PLACEMENT Right 12/28/2017   Procedure: CYSTOSCOPY WITH STENT Exchange;  Surgeon: [name], MD;  Location: [facility];  Service: Urology;  Laterality: Right;   CYSTOSCOPY/URETEROSCOPY/HOLMIUM LASER/STENT PLACEMENT Right 08/19/2017   Procedure: CYSTOSCOPY/URETEROSCOPY/HOLMIUM LASER/STENT PLACEMENT;  Surgeon: [name], MD;  Location: [facility];  Service: Urology;  Laterality: Right;   EYE SURGERY Bilateral    Cataract Extraction with IOL  goiter removal     HOLMIUM LASER APPLICATION N/A 5/78/4696   Procedure:  HOLMIUM LASER APPLICATION;  Surgeon: [name], MD;  Location: [facility];  Service: Urology;  Laterality: N/A;   PORTA CATH INSERTION N/A 09/23/2017   Procedure: PORTA CATH INSERTION;  Surgeon: Algernon Huxley, MD;  Location: [place] [facility];  Service: Cardiovascular;  Laterality: N/A;   SPERMATOCELECTOMY     TONSILLECTOMY     TRANSURETHRAL  RESECTION OF BLADDER TUMOR N/A 02/26/2016   Procedure: TRANSURETHRAL RESECTION OF BLADDER TUMOR (TURBT);  Surgeon: [name], MD;  Location: [facility];  Service: Urology;  Laterality: N/A;   TRANSURETHRAL RESECTION OF BLADDER TUMOR WITH MITOMYCIN-C N/A 09/12/2015   Procedure: TRANSURETHRAL RESECTION OF BLADDER TUMOR ;  Surgeon: [name], MD;  Location: [facility];  Service: Urology;  Laterality: N/A;   TRANSURETHRAL RESECTION OF BLADDER TUMOR WITH MITOMYCIN-C N/A 03/24/2016   Procedure: TRANSURETHRAL RESECTION OF BLADDER TUMOR WITH MITOMYCIN-C  (SMALL);  Surgeon: [name], MD;  Location: [facility];  Service: Urology;  Laterality: N/A;   URETERAL BIOPSY Right 08/19/2017   Procedure: Renal Mass BIOPSY;  Surgeon: [name], MD;  Location: [facility];  Service: Urology;  Laterality: Right;   URETEROSCOPY Right 01/30/2015   Procedure: URETEROSCOPY/ WITH BIOPSY AND CYTOLOGY BRUSHING;  Surgeon: [name], MD;  Location: [facility];  Service: Urology;  Laterality: Right;   URETEROSCOPY Right 08/20/2015   Procedure: URETEROSCOPY;  Surgeon: [name], MD;  Location: [facility];  Service: Urology;  Laterality: Right;   URETEROSCOPY Right 09/12/2015   Procedure: URETEROSCOPY;  Surgeon: [name], MD;  Location: [facility];  Service: Urology;  Laterality: Right;   URETEROSCOPY Right 02/26/2016   Procedure: URETEROSCOPY;  Surgeon: [name], MD;  Location: [facility];  Service: Urology;  Laterality: Right;    Social History   Socioeconomic History   Marital status: Married    Spouse name: Not on file   Number of children: Not on file   Years of education: Not on file   Highest education level: Not on file  Occupational History   Not on file  Tobacco Use   Smoking status: Never   Smokeless tobacco: Never  Vaping Use   Vaping Use: Never used  Substance and Sexual Activity   Alcohol use: No    Alcohol/week: 0.0 standard drinks of alcohol   Drug use: No   Sexual activity: Not  Currently  Other Topics Concern   Not on file  Social History Narrative   Not on file   Social Determinants of Health   Financial Resource Strain: Not on file  Food Insecurity: Not on file  Transportation Needs: Not on file  Physical Activity: Not on file  Stress: Not on file  Social Connections: Not on file  Intimate Partner Violence: Not on file    Family History  Problem Relation Age of Onset   Hypertension Mother    Hypertension Father    Prostate cancer Brother    Kidney disease Neg Hx    Kidney cancer Neg Hx    Bladder Cancer Neg Hx      Current Outpatient Medications:    acetaminophen (TYLENOL) 500 MG tablet, Take 500 mg by mouth every 6 (six) hours as needed., Disp: , Rfl:    amLODipine (NORVASC) 5 MG tablet, , Disp: , Rfl:    aspirin EC 325 MG tablet, Take 325 mg by mouth daily., Disp: , Rfl:    camphor-menthol (SARNA) lotion, Apply 1 application topically  as needed for itching., Disp: , Rfl:    diazepam (VALIUM) 5 MG tablet, TAKE 1 TABLET (5 MG TOTAL) BY MOUTH EVERY 12 (TWELVE) HOURS AS NEEDED FOR ANXIETY OR SLEEP, Disp: , Rfl:    fenofibrate micronized (LOFIBRA) 134 MG capsule, Take 134 mg by mouth daily before breakfast. , Disp: , Rfl:    hydrOXYzine (ATARAX) 10 MG tablet, Take 1 tablet (10 mg total) by mouth 3 (three) times daily as needed for itching., Disp: 120 tablet, Rfl: 0   Multiple Vitamin (MULTIVITAMIN WITH MINERALS) TABS tablet, Take 1 tablet by mouth daily., Disp: , Rfl:    polyethylene glycol (MIRALAX / GLYCOLAX) 17 g packet, Take 17 g by mouth daily., Disp: , Rfl:    predniSONE (DELTASONE) 10 MG tablet, Take 5 tablets (50 mg total) by mouth daily with breakfast., Disp: 120 tablet, Rfl: 0   psyllium (METAMUCIL) 58.6 % packet, Take 1 packet by mouth daily., Disp: , Rfl:    senna (SENOKOT) 8.6 MG tablet, Take 2 tablets by mouth daily., Disp: , Rfl:    tamsulosin (FLOMAX) 0.4 MG CAPS capsule, Take 1 capsule (0.4 mg total) by mouth daily., Disp: 90  capsule, Rfl: 3   triamcinolone cream (KENALOG) 0.1 %, Apply 1 application. topically 2 (two) times daily., Disp: , Rfl:    pantoprazole (PROTONIX) 20 MG tablet, Take 1 tablet (20 mg total) by mouth daily for 14 days. (Patient not taking: Reported on 09/27/2021), Disp: 14 tablet, Rfl: 0   prochlorperazine (COMPAZINE) 10 MG tablet, Take 1 tablet (10 mg total) by mouth every 6 (six) hours as needed for nausea or vomiting. (Patient not taking: Reported on 07/01/2021), Disp: 30 tablet, Rfl: 0 No current facility-administered medications for this visit.  Facility-Administered Medications Ordered in Other Visits:    heparin lock flush 100 UNIT/ML injection, , , ,    heparin lock flush 100 UNIT/ML injection, , , ,    heparin lock flush 100 unit/mL, 500 Units, Intravenous, Once, Sindy Guadeloupe, MD   sodium chloride flush (NS) 0.9 % injection 10 mL, 10 mL, Intravenous, Once, Sindy Guadeloupe, MD   sodium chloride flush (NS) 0.9 % injection 10 mL, 10 mL, Intravenous, PRN, Sindy Guadeloupe, MD   sodium chloride flush (NS) 0.9 % injection 10 mL, 10 mL, Intravenous, PRN, Sindy Guadeloupe, MD, 10 mL at 08/21/21 0841  Physical exam:  Physical Exam Cardiovascular:     Rate and Rhythm: Normal rate and regular rhythm.     Heart sounds: Normal heart sounds.  Pulmonary:     Effort: Pulmonary effort is normal.     Breath sounds: Normal breath sounds.  Abdominal:     General: Bowel sounds are normal.     Palpations: Abdomen is soft.  Skin:    General: Skin is warm and dry.  Neurological:     Mental Status: He is alert and oriented to person, place, and time.         Latest Ref Rng & Units 08/21/2021    8:41 AM  CMP  Glucose 70 - 99 mg/dL 136   BUN 8 - 23 mg/dL 22   Creatinine 0.61 - 1.24 mg/dL 0.87   Sodium 135 - 145 mmol/L 137   Potassium 3.5 - 5.1 mmol/L 3.7   Chloride 98 - 111 mmol/L 102   CO2 22 - 32 mmol/L 26   Calcium 8.9 - 10.3 mg/dL 8.4   Total Protein 6.5 - 8.1 g/dL 6.2   Total Bilirubin 0.3 -  1.2 mg/dL 0.7   Alkaline Phos 38 - 126 U/L 74   AST 15 - 41 U/L 28   ALT 0 - 44 U/L 27       Latest Ref Rng & Units 09/27/2021   10:45 AM  CBC  WBC 4.0 - 10.5 K/uL 11.5   Hemoglobin 13.0 - 17.0 g/dL 15.9   Hematocrit 39.0 - 52.0 % 46.3   Platelets 150 - 400 K/uL 320       CT CHEST ABDOMEN PELVIS W CONTRAST  Result Date: 09/14/2021 CLINICAL DATA:  Restaging metastatic ureteral carcinoma. EXAM: CT CHEST, ABDOMEN, AND PELVIS WITH CONTRAST TECHNIQUE: Multidetector CT imaging of the chest, abdomen and pelvis was performed following the standard protocol during bolus administration of intravenous contrast. RADIATION DOSE REDUCTION: This exam was performed according to the departmental dose-optimization program which includes automated exposure control, adjustment of the mA and/or kV according to patient size and/or use of iterative reconstruction technique. CONTRAST:  37mL OMNIPAQUE IOHEXOL 300 MG/ML  SOLN COMPARISON:  Numerous previous imaging studies. The most recent examination is a chest CT from 07/15/2021. Prior CT scan the abdomen and pelvis is from 04/05/2021. FINDINGS: CT CHEST FINDINGS Cardiovascular: The heart is normal in size. No pericardial effusion. Small amount of fluid in the pericardial recesses is stable. Stable aortic and coronary artery calcifications. No aortic aneurysm dissection. Mediastinum/Nodes: Stable scattered mediastinal and hilar lymph nodes. No mass or overt adenopathy. Esophagus is grossly normal. Lungs/Pleura: Stable slightly lobulated lesion in the right upper lobe measuring 12 x 10 mm. Several peripheral/subpleural sub 5 mm pulmonary nodules are stable. No new or progressive findings. No acute pulmonary process. No pleural effusions or pleural nodules. Musculoskeletal: Stable mild bilateral gynecomastia. No supraclavicular or axillary adenopathy. The bony thorax is intact. CT ABDOMEN PELVIS FINDINGS Hepatobiliary: No findings for hepatic metastatic disease. Small  layering gallstones again noted in the gallbladder. The common bile duct is normal in caliber. Pancreas: No mass, inflammation or ductal dilatation. Two stable simple 10 mm cysts in the pancreatic head. Spleen: Normal size. No focal lesions. Adrenals/Urinary Tract: The adrenal glands and kidneys are unremarkable and stable. Stable simple renal cysts. These do not require follow-up. The delayed images do not demonstrate any significant collecting system abnormalities. The upper ureters are unremarkable. The bladder is unremarkable. Stable small left-sided posterior bladder diverticulum. Stomach/Bowel: The stomach, duodenum, small and colon are unremarkable. No acute inflammatory changes, mass lesions or obstructive findings. The terminal ileum and appendix are normal. Stable sigmoid colon diverticulosis. Vascular/Lymphatic: The aorta is normal in caliber. No dissection. The branch vessels are patent. The major venous structures are patent. No mesenteric or retroperitoneal mass or adenopathy. Small scattered lymph nodes are noted. Reproductive: The prostate gland is mildly enlarged. The seminal vesicles are unremarkable. Other: No pelvic mass. 15 x 13 mm right internal iliac lymph node noted on image number 116/2. This previously measured 21 x 15 mm. No free pelvic fluid collections. No inguinal mass or adenopathy. No abdominal wall hernia or subcutaneous lesions. Musculoskeletal: No significant bony findings. IMPRESSION: 1. Stable right upper lobe pulmonary nodule. Other small scattered sub 5 mm nodules are stable also. No new or progressive findings. 2. Stable scattered mediastinal and hilar lymph nodes. 3. No findings for abdominal/pelvic metastatic disease. 4. Continued regression of the right pelvic lymph node. 5. Cholelithiasis. 6. Stable pancreatic and renal cysts. * Tracking Code: BO * Electronically Signed   By: Marijo Sanes M.D.   On: 09/14/2021 14:34  Assessment and plan- Patient is a 88 y.o. male  with metastatic urothelial carcinoma previously on Padcev here to discuss CT scan results and further management  I have reviewed CT chest abdomen pelvis images independently and discussed findings with the patientWhich overall shows treatment response and regression in the size of internal iliac lymph node.  Lung nodules are stable.  No evidence of progressive disease.  Patient was again developing skin rash at this time also had blurry vision likely secondary to [facility].  A[place]a [place] has therefore been discontinued since May 2023 and I will continue to observe him off treatment at this time.  Previously we were able to go for 9 months before having to restart Padcev.  I will see him back in 3 months with port labs CBC with differential CMP and repeat imaging prior  Drug-induced skin rash: Secondary to Padcev.  Patient will complete his steroid taper at this time.  If skin rash recurs we will get in touch with Dr. Evorn Gong from dermatology and see if the patient needs to be restarted on weekly methotrexate at that point.   Visit Diagnosis 1. 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[place])   2. Goals of care, counseling/discussion   3. Drug-induced skin rash      Dr. Randa Evens, MD, MPH [facility] [phone] 09/27/2021 1:04 PM

## 2021-10-07 ENCOUNTER — Other Ambulatory Visit: Payer: Self-pay | Admitting: Nurse Practitioner

## 2021-10-11 DIAGNOSIS — I10 Essential (primary) hypertension: Secondary | ICD-10-CM | POA: Diagnosis not present

## 2021-10-11 DIAGNOSIS — E7800 Pure hypercholesterolemia, unspecified: Secondary | ICD-10-CM | POA: Diagnosis not present

## 2021-10-11 DIAGNOSIS — R739 Hyperglycemia, unspecified: Secondary | ICD-10-CM | POA: Diagnosis not present

## 2021-10-11 DIAGNOSIS — Z79899 Other long term (current) drug therapy: Secondary | ICD-10-CM | POA: Diagnosis not present

## 2021-10-14 ENCOUNTER — Encounter: Payer: Self-pay | Admitting: Medical Oncology

## 2021-10-14 ENCOUNTER — Telehealth: Payer: Self-pay | Admitting: *Deleted

## 2021-10-14 ENCOUNTER — Inpatient Hospital Stay: Payer: PPO | Attending: Oncology | Admitting: Medical Oncology

## 2021-10-14 ENCOUNTER — Other Ambulatory Visit: Payer: Self-pay

## 2021-10-14 VITALS — BP 165/89 | HR 84 | Temp 96.3°F | Resp 18 | Wt 189.3 lb

## 2021-10-14 DIAGNOSIS — Z79899 Other long term (current) drug therapy: Secondary | ICD-10-CM | POA: Insufficient documentation

## 2021-10-14 DIAGNOSIS — C661 Malignant neoplasm of right ureter: Secondary | ICD-10-CM | POA: Diagnosis not present

## 2021-10-14 DIAGNOSIS — C669 Malignant neoplasm of unspecified ureter: Secondary | ICD-10-CM | POA: Diagnosis not present

## 2021-10-14 DIAGNOSIS — L299 Pruritus, unspecified: Secondary | ICD-10-CM | POA: Insufficient documentation

## 2021-10-14 DIAGNOSIS — R531 Weakness: Secondary | ICD-10-CM | POA: Insufficient documentation

## 2021-10-14 NOTE — Progress Notes (Signed)
Symptom Management [place] at [facility] Telephone:([phone]) [phone] Fax:(336) [phone]  Patient Care Team: Idelle Crouch, MD as PCP - General (Internal Medicine) Sindy Guadeloupe, MD as Consulting Physician (Hematology and Oncology) Sindy Guadeloupe, MD as Consulting Physician (Hematology and Oncology) Sindy Guadeloupe, MD as Consulting Physician (Hematology and Oncology)   Name of the patient: Anthony Gonzales  [id number]  Jun 16, 1931   Date of visit: 10/14/21  Reason for Consult: Anthony Gonzales is a 88 y.o. male who presents today for:  Rash: Pt has a history of a pruritic rash which is thought to be secondary to former Padcef use. No longer on this medication. Has been off and on prednisone. Prednisone cures the rash. In the past he had been on methotrexate for the rash with improvement. He reports that on Thursday he finished his prednisone. Last dose was 50 mg. Rash was almost fully resolved. Rash has now reappeared. Itchy and warm as it always is. No trouble swallowing and no fever or SOB.   Denies any neurologic complaints. Denies recent fevers or illnesses. Denies any easy bleeding or bruising. Reports good appetite and denies weight loss. Denies chest pain. Denies any nausea, vomiting, constipation, or diarrhea. Denies urinary complaints. Patient offers no further specific complaints today.    PAST MEDICAL HISTORY: Past Medical History:  Diagnosis Date   Arthritis    Benign fibroma of prostate 08/23/2013   BPH (benign prostatic hyperplasia)    Calculus of kidney 08/23/2013   Glaucoma    no drops in 3 mo pressure good, pt denies glaucoma, eye pressure has been measuring alright.   History of kidney stones    HLD (hyperlipidemia)    HOH (hard of hearing)    Left Hearing Aid   HTN (hypertension) 12/26/2014   Hypertension    Hyponatremia 12/26/2014   Migraines    history of migraines when he was younger.   Restless leg syndrome     Sinus drainage    Skin cancer    Skin cancer    left hand 06/2019   Urothelial cancer ([place])    chemo tx's.   UTI (lower urinary tract infection) 12/26/2014   Vertigo     PAST SURGICAL HISTORY:  Past Surgical History:  Procedure Laterality Date   COLONOSCOPY     CYSTOSCOPY W/ RETROGRADES Right 01/30/2015   Procedure: CYSTOSCOPY WITH RETROGRADE PYELOGRAM;  Surgeon: [name], MD;  Location: [facility];  Service: Urology;  Laterality: Right;   CYSTOSCOPY W/ RETROGRADES Bilateral 02/26/2016   Procedure: CYSTOSCOPY WITH RETROGRADE PYELOGRAM;  Surgeon: [name], MD;  Location: [facility];  Service: Urology;  Laterality: Bilateral;   CYSTOSCOPY W/ URETERAL STENT PLACEMENT Right 08/20/2015   Procedure: CYSTOSCOPY WITH RETROGRADE PYELOGRAM/POSSIBLE URETERAL STENT PLACEMENT/BLADDER BIOPSY;  Surgeon: [name], MD;  Location: [facility];  Service: Urology;  Laterality: Right;   CYSTOSCOPY W/ URETERAL STENT PLACEMENT Right 09/12/2015   Procedure: CYSTOSCOPY WITH STENT REPLACEMENT;  Surgeon: [name], MD;  Location: [facility];  Service: Urology;  Laterality: Right;   CYSTOSCOPY WITH BIOPSY Right 09/12/2015   Procedure: CYSTOSCOPY WITH BLADDER AND URETERAL BIOPSY;  Surgeon: [name], MD;  Location: [facility];  Service: Urology;  Laterality: Right;   CYSTOSCOPY WITH STENT PLACEMENT Right 01/30/2015   Procedure: CYSTOSCOPY WITH STENT PLACEMENT;  Surgeon: [name], MD;  Location: [facility];  Service: Urology;  Laterality: Right;   CYSTOSCOPY WITH STENT PLACEMENT Right 12/28/2017   Procedure: CYSTOSCOPY WITH STENT Exchange;  Surgeon: [name], MD;  Location: [facility];  Service: Urology;  Laterality: Right;   CYSTOSCOPY/URETEROSCOPY/HOLMIUM LASER/STENT PLACEMENT Right 08/19/2017   Procedure: CYSTOSCOPY/URETEROSCOPY/HOLMIUM LASER/STENT PLACEMENT;  Surgeon: [name], MD;  Location: [facility];  Service: Urology;  Laterality: Right;   EYE SURGERY Bilateral    Cataract  Extraction with IOL   goiter removal     HOLMIUM LASER APPLICATION N/A 6/37/8588   Procedure:  HOLMIUM LASER APPLICATION;  Surgeon: [name], MD;  Location: [facility];  Service: Urology;  Laterality: N/A;   PORTA CATH INSERTION N/A 09/23/2017   Procedure: PORTA CATH INSERTION;  Surgeon: Algernon Huxley, MD;  Location: [place] [facility];  Service: Cardiovascular;  Laterality: N/A;   SPERMATOCELECTOMY     TONSILLECTOMY     TRANSURETHRAL RESECTION OF BLADDER TUMOR N/A 02/26/2016   Procedure: TRANSURETHRAL RESECTION OF BLADDER TUMOR (TURBT);  Surgeon: [name], MD;  Location: [facility];  Service: Urology;  Laterality: N/A;   TRANSURETHRAL RESECTION OF BLADDER TUMOR WITH MITOMYCIN-C N/A 09/12/2015   Procedure: TRANSURETHRAL RESECTION OF BLADDER TUMOR ;  Surgeon: [name], MD;  Location: [facility];  Service: Urology;  Laterality: N/A;   TRANSURETHRAL RESECTION OF BLADDER TUMOR WITH MITOMYCIN-C N/A 03/24/2016   Procedure: TRANSURETHRAL RESECTION OF BLADDER TUMOR WITH MITOMYCIN-C  (SMALL);  Surgeon: [name], MD;  Location: [facility];  Service: Urology;  Laterality: N/A;   URETERAL BIOPSY Right 08/19/2017   Procedure: Renal Mass BIOPSY;  Surgeon: [name], MD;  Location: [facility];  Service: Urology;  Laterality: Right;   URETEROSCOPY Right 01/30/2015   Procedure: URETEROSCOPY/ WITH BIOPSY AND CYTOLOGY BRUSHING;  Surgeon: [name], MD;  Location: [facility];  Service: Urology;  Laterality: Right;   URETEROSCOPY Right 08/20/2015   Procedure: URETEROSCOPY;  Surgeon: [name], MD;  Location: [facility];  Service: Urology;  Laterality: Right;   URETEROSCOPY Right 09/12/2015   Procedure: URETEROSCOPY;  Surgeon: [name], MD;  Location: [facility];  Service: Urology;  Laterality: Right;   URETEROSCOPY Right 02/26/2016   Procedure: URETEROSCOPY;  Surgeon: [name], MD;  Location: [facility];  Service: Urology;  Laterality: Right;    HEMATOLOGY/ONCOLOGY HISTORY:  Oncology  History  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[place])  09/15/2017 Cancer Staging   Staging form: Renal Pelvis and Ureter, AJCC 8th Edition - Clinical stage from 09/15/2017: Stage IV (cT2, cN0, cM1) - Signed by Sindy Guadeloupe, MD on 09/17/2017   09/17/2017 Initial Diagnosis   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[place])   09/22/2017 - 05/26/2018 Chemotherapy   The patient had dexamethasone (DECADRON) 4 MG tablet, 8 mg, Oral, Daily, 1 of 1 cycle, Start date: 05/11/2018, End date: 06/06/2018 palonosetron (ALOXI) injection 0.25 mg, 0.25 mg, Intravenous,  Once, 9 of 10 cycles Administration: 0.25 mg (09/22/2017), 0.25 mg (10/06/2017), 0.25 mg (10/20/2017), 0.25 mg (11/03/2017), 0.25 mg (11/17/2017), 0.25 mg (12/01/2017), 0.25 mg (12/15/2017), 0.25 mg (12/29/2017), 0.25 mg (01/12/2018), 0.25 mg (01/26/2018), 0.25 mg (02/09/2018), 0.25 mg (02/23/2018), 0.25 mg (03/16/2018), 0.25 mg (04/13/2018), 0.25 mg (05/11/2018) pegfilgrastim-cbqv (UDENYCA) injection 6 mg, 6 mg, Subcutaneous, Once, 5 of 6 cycles Administration: 6 mg (01/13/2018), 6 mg (02/10/2018), 6 mg (03/17/2018), 6 mg (04/15/2018), 6 mg (05/12/2018), 6 mg (05/26/2018) CARBOplatin (PARAPLATIN) 160 mg in sodium chloride 0.9 % 250 mL chemo infusion, 160 mg (100 % of original dose 163.2 mg), Intravenous,  Once, 9 of 10 cycles Dose modification:   (original dose 163.2 mg, Cycle 1) Administration: 160 mg (09/22/2017), 170 mg (10/06/2017), 170 mg (10/20/2017), 170  mg (11/17/2017), 160 mg (12/01/2017), 170 mg (11/03/2017), 160 mg (12/15/2017), 160 mg (12/29/2017), 180 mg (01/12/2018), 180 mg (01/26/2018), 160 mg (02/09/2018), 160 mg (02/23/2018), 160 mg (03/16/2018), 160 mg (03/30/2018), 160 mg (04/13/2018), 180 mg (04/27/2018), 180 mg (05/11/2018), 160 mg (05/25/2018) gemcitabine (GEMZAR) 1,600 mg in sodium chloride 0.9 % 250 mL chemo infusion, 1,672 mg (100 % of original dose 800 mg/m2), Intravenous,  Once, 9 of 10 cycles Dose modification: 800 mg/m2 (original dose 800 mg/m2, Cycle 1, Reason: Patient  Age) Administration: 1,600 mg (09/22/2017), 1,600 mg (10/06/2017), 1,600 mg (10/20/2017), 1,600 mg (11/03/2017), 1,600 mg (11/17/2017), 1,600 mg (12/01/2017), 1,600 mg (12/15/2017), 1,600 mg (12/29/2017), 1,600 mg (01/12/2018), 1,600 mg (01/26/2018), 1,600 mg (02/09/2018), 1,600 mg (02/23/2018), 1,600 mg (03/16/2018), 1,600 mg (03/30/2018), 1,600 mg (04/13/2018), 1,600 mg (04/27/2018), 1,600 mg (05/11/2018), 1,600 mg (05/25/2018)  for chemotherapy treatment.    06/08/2018 - 12/21/2018 Chemotherapy   The patient had pembrolizumab (KEYTRUDA) 200 mg in sodium chloride 0.9 % 50 mL chemo infusion, 200 mg, Intravenous, Once, 10 of 11 cycles Administration: 200 mg (06/08/2018), 200 mg (06/29/2018), 200 mg (07/20/2018), 200 mg (08/13/2018), 200 mg (09/03/2018), 200 mg (09/24/2018), 200 mg (10/19/2018), 200 mg (11/09/2018), 200 mg (11/30/2018), 200 mg (12/21/2018)  for chemotherapy treatment.    02/15/2019 -  Chemotherapy   Patient is on Treatment Plan : UROTHELIAL LOCALLY ADVANCED / METASTATIC Enfortumab q28d       ALLERGIES:  is allergic to demerol [meperidine], lipitor [atorvastatin], and sulfa antibiotics.  MEDICATIONS:  Current Outpatient Medications  Medication Sig Dispense Refill   acetaminophen (TYLENOL) 500 MG tablet Take 500 mg by mouth every 6 (six) hours as needed.     amLODipine (NORVASC) 5 MG tablet      aspirin EC 325 MG tablet Take 325 mg by mouth daily.     camphor-menthol (SARNA) lotion Apply 1 application topically as needed for itching.     diazepam (VALIUM) 5 MG tablet TAKE 1 TABLET (5 MG TOTAL) BY MOUTH EVERY 12 (TWELVE) HOURS AS NEEDED FOR ANXIETY OR SLEEP     fenofibrate micronized (LOFIBRA) 134 MG capsule Take 134 mg by mouth daily before breakfast.      hydrOXYzine (ATARAX) 10 MG tablet Take 1 tablet (10 mg total) by mouth 3 (three) times daily as needed for itching. 120 tablet 0   Multiple Vitamin (MULTIVITAMIN WITH MINERALS) TABS tablet Take 1 tablet by mouth daily.     pantoprazole (PROTONIX) 20 MG  tablet Take 1 tablet (20 mg total) by mouth daily for 14 days. 14 tablet 0   polyethylene glycol (MIRALAX / GLYCOLAX) 17 g packet Take 17 g by mouth daily.     psyllium (METAMUCIL) 58.6 % packet Take 1 packet by mouth daily.     senna (SENOKOT) 8.6 MG tablet Take 2 tablets by mouth daily.     tamsulosin (FLOMAX) 0.4 MG CAPS capsule Take 1 capsule (0.4 mg total) by mouth daily. 90 capsule 3   triamcinolone cream (KENALOG) 0.1 % Apply 1 application. topically 2 (two) times daily.     prochlorperazine (COMPAZINE) 10 MG tablet Take 1 tablet (10 mg total) by mouth every 6 (six) hours as needed for nausea or vomiting. (Patient not taking: Reported on 07/01/2021) 30 tablet 0   No current facility-administered medications for this visit.   Facility-Administered Medications Ordered in Other Visits  Medication Dose Route Frequency Provider Last Rate Last Admin   heparin lock flush 100 UNIT/ML injection  heparin lock flush 100 UNIT/ML injection            heparin lock flush 100 unit/mL  500 Units Intravenous Once Sindy Guadeloupe, MD       sodium chloride flush (NS) 0.9 % injection 10 mL  10 mL Intravenous Once Sindy Guadeloupe, MD       sodium chloride flush (NS) 0.9 % injection 10 mL  10 mL Intravenous PRN Sindy Guadeloupe, MD       sodium chloride flush (NS) 0.9 % injection 10 mL  10 mL Intravenous PRN Sindy Guadeloupe, MD   10 mL at 08/21/21 0841    VITAL SIGNS: BP (!) 165/89   Pulse 84   Temp (!) 96.3 F (35.7 C) (Tympanic)   Resp 18  There were no vitals filed for this visit.  Estimated body mass index is 25.09 kg/m as calculated from the following:   Height as of 09/27/21: 6' (1.829 m).   Weight as of 09/27/21: 185 lb (83.9 kg).  LABS: CBC:    Component Value Date/Time   WBC 11.5 (H) 09/27/2021 1045   HGB 15.9 09/27/2021 1045   HGB 14.1 09/04/2017 0949   HCT 46.3 09/27/2021 1045   HCT 40.5 09/04/2017 0949   PLT 320 09/27/2021 1045   PLT 282 09/04/2017 0949   MCV 90.4 09/27/2021  1045   MCV 86 09/04/2017 0949   MCV 88 03/23/2013 0418   NEUTROABS 9.2 (H) 09/27/2021 1045   NEUTROABS 4.6 09/04/2017 0949   NEUTROABS 2.8 03/23/2013 0418   LYMPHSABS 1.4 09/27/2021 1045   LYMPHSABS 1.9 09/04/2017 0949   LYMPHSABS 1.2 03/23/2013 0418   MONOABS 0.6 09/27/2021 1045   MONOABS 0.8 03/23/2013 0418   EOSABS 0.0 09/27/2021 1045   EOSABS 0.3 09/04/2017 0949   EOSABS 0.1 03/23/2013 0418   BASOSABS 0.1 09/27/2021 1045   BASOSABS 0.0 09/04/2017 0949   BASOSABS 0.0 03/23/2013 0418   Comprehensive Metabolic Panel:    Component Value Date/Time   NA 135 09/27/2021 1045   NA 140 03/23/2013 0418   K 4.6 09/27/2021 1045   K 3.8 03/23/2013 0418   CL 100 09/27/2021 1045   CL 111 (H) 03/23/2013 0418   CO2 26 09/27/2021 1045   CO2 24 03/23/2013 0418   BUN 26 (H) 09/27/2021 1045   BUN 24 (H) 03/23/2013 0418   CREATININE 0.90 09/27/2021 1045   CREATININE 1.79 (H) 03/23/2013 0418   GLUCOSE 131 (H) 09/27/2021 1045   GLUCOSE 98 03/23/2013 0418   CALCIUM 8.7 (L) 09/27/2021 1045   CALCIUM 8.0 (L) 03/23/2013 0418   AST 29 09/27/2021 1045   AST 40 (H) 03/22/2013 0451   ALT 31 09/27/2021 1045   ALT 28 03/22/2013 0451   ALKPHOS 89 09/27/2021 1045   ALKPHOS 32 (L) 03/22/2013 0451   BILITOT 0.9 09/27/2021 1045   BILITOT 0.3 03/22/2013 0451   PROT 6.5 09/27/2021 1045   PROT 5.5 (L) 03/22/2013 0451   ALBUMIN 3.5 09/27/2021 1045   ALBUMIN 2.7 (L) 03/22/2013 0451    RADIOGRAPHIC STUDIES: No results found.  PERFORMANCE STATUS (ECOG) : 1 - Symptomatic but completely ambulatory  Review of Systems Unless otherwise noted, a complete review of systems is negative.  Physical Exam General: NAD Cardiovascular: regular rate and rhythm Pulmonary: clear ant fields Extremities: no edema, no joint deformities Skin: See below Neurological: Weakness but otherwise nonfocal       Assessment and Plan- Patient is a 88 y.o. male  Encounter  Diagnoses  Name Primary?   Pruritus Yes    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H[facility])    Acute on chronic. 40-50 mg of prednisone once daily tends to keep this rash well controlled. This appears to be the lowest effective dose. Discussed risks/benefits. Refilling this rx for him and referring him to his dermatologist as he very well may need to be on Methotrexate. Discussed.      Patient expressed understanding and was in agreement with this plan. He also understands that He can call clinic at any time with any questions, concerns, or complaints.   Thank you for allowing me to participate in the care of this very pleasant patient.   Time Total: 25  Visit consisted of counseling and education dealing with the complex and emotionally intense issues of symptom management in the setting of serious illness.Greater than 50%  of this time was spent counseling and coordinating care related to the above assessment and plan.  Signed by: Nelwyn Salisbury, PA-C

## 2021-10-14 NOTE — Telephone Encounter (Signed)
Patient called triage. Left vm stating, "my arms have turned red again." I will call patient back and obtain more details.  Reviewed chart - Dr. Elroy Channel last note stated, "Drug-induced skin rash: Secondary to Padcev.  Patient will complete his steroid taper at this time.  If skin rash recurs we will get in touch with Dr. Evorn Gong from dermatology and see if the patient needs to be restarted on weekly methotrexate at that point."

## 2021-10-14 NOTE — Telephone Encounter (Signed)
Returned pt's phone call. Rash is not near as bad as before but still present. Pt' reports the top of the hands look pretty normal before pt went off prednisone last Thursday. On Sunday, the rash is now back on his arms reoccurred and is now itching. Rash is also present on his lower back and shoulder that has now occurred and possible on chest.

## 2021-10-14 NOTE — Progress Notes (Signed)
Referral initiated and faxed to Dr. Roosvelt Harps office.

## 2021-10-14 NOTE — Telephone Encounter (Signed)
APT made with Judson Roch, PA at 245 today to evaluate rash.

## 2021-10-15 MED ORDER — PREDNISONE 10 MG PO TABS: 50.0000 mg | ORAL_TABLET | Freq: Every day | ORAL | 0 refills | Status: AC

## 2021-10-15 NOTE — Addendum Note (Signed)
Addended by: Nelwyn Salisbury on: 10/15/2021 02:01 PM   Modules accepted: Orders

## 2021-10-16 ENCOUNTER — Telehealth: Payer: Self-pay | Admitting: *Deleted

## 2021-10-16 DIAGNOSIS — L270 Generalized skin eruption due to drugs and medicaments taken internally: Secondary | ICD-10-CM | POA: Diagnosis not present

## 2021-10-16 DIAGNOSIS — Z515 Encounter for palliative care: Secondary | ICD-10-CM | POA: Diagnosis not present

## 2021-10-16 DIAGNOSIS — T451X5S Adverse effect of antineoplastic and immunosuppressive drugs, sequela: Secondary | ICD-10-CM | POA: Diagnosis not present

## 2021-10-16 DIAGNOSIS — C7919 Secondary malignant neoplasm of other urinary organs: Secondary | ICD-10-CM | POA: Diagnosis not present

## 2021-10-16 DIAGNOSIS — C7900 Secondary malignant neoplasm of unspecified kidney and renal pelvis: Secondary | ICD-10-CM | POA: Diagnosis not present

## 2021-10-16 DIAGNOSIS — Z66 Do not resuscitate: Secondary | ICD-10-CM | POA: Diagnosis not present

## 2021-10-16 DIAGNOSIS — C7801 Secondary malignant neoplasm of right lung: Secondary | ICD-10-CM | POA: Diagnosis not present

## 2021-10-16 DIAGNOSIS — Z7952 Long term (current) use of systemic steroids: Secondary | ICD-10-CM | POA: Diagnosis not present

## 2021-10-16 DIAGNOSIS — C679 Malignant neoplasm of bladder, unspecified: Secondary | ICD-10-CM | POA: Diagnosis not present

## 2021-10-16 DIAGNOSIS — I10 Essential (primary) hypertension: Secondary | ICD-10-CM | POA: Diagnosis not present

## 2021-10-16 NOTE — Telephone Encounter (Signed)
I called Dr Jenel Lucks office and spoke with receptionist who then transferred me to nurses line where it went to voice mail, I left detailed message and asked that she call me back

## 2021-10-16 NOTE — Telephone Encounter (Signed)
Patient called stating that Dr Janese Banks was to have called Dr Evorn Gong to have him order the 3 pills per week medicine for his rash and he only ordered the cream that he already has. He would like to discuss this with someone to get the pills. Per last office note: Drug-induced skin rash: Secondary to Padcev.  Patient will complete his steroid taper at this time.  If skin rash recurs we will get in touch with Dr. Evorn Gong from dermatology and see if the patient needs to be restarted on weekly methotrexate at that point.   Visit Diagnosis 1. 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 Goals of care, counseling/discussion   3. Drug-induced skin rash         Dr. Randa Evens, MD, MPH [facility] [phone] 09/27/2021 1:04 PM

## 2021-10-16 NOTE — Telephone Encounter (Signed)
I received a call from Dr Evorn Gong and then from his office stating that he is going to work patient in for evaluation on Friday before he starts Methotrexate on patient. Office stated that patient ia aware of appointment Friday. Dr Evorn Gong said if you need to reach him the best way is on his cell [phone]

## 2021-10-18 DIAGNOSIS — C676 Malignant neoplasm of ureteric orifice: Secondary | ICD-10-CM | POA: Diagnosis not present

## 2021-10-18 DIAGNOSIS — Z79899 Other long term (current) drug therapy: Secondary | ICD-10-CM | POA: Diagnosis not present

## 2021-10-18 DIAGNOSIS — L270 Generalized skin eruption due to drugs and medicaments taken internally: Secondary | ICD-10-CM | POA: Diagnosis not present

## 2021-10-18 DIAGNOSIS — T466X5A Adverse effect of antihyperlipidemic and antiarteriosclerotic drugs, initial encounter: Secondary | ICD-10-CM | POA: Diagnosis not present

## 2021-10-18 DIAGNOSIS — R739 Hyperglycemia, unspecified: Secondary | ICD-10-CM | POA: Diagnosis not present

## 2021-10-18 DIAGNOSIS — G720 Drug-induced myopathy: Secondary | ICD-10-CM | POA: Diagnosis not present

## 2021-10-18 DIAGNOSIS — I10 Essential (primary) hypertension: Secondary | ICD-10-CM | POA: Diagnosis not present

## 2021-10-18 DIAGNOSIS — E782 Mixed hyperlipidemia: Secondary | ICD-10-CM | POA: Diagnosis not present

## 2021-10-21 DIAGNOSIS — Z79899 Other long term (current) drug therapy: Secondary | ICD-10-CM | POA: Diagnosis not present

## 2021-10-23 ENCOUNTER — Other Ambulatory Visit: Payer: Self-pay | Admitting: Nurse Practitioner

## 2021-10-28 ENCOUNTER — Telehealth: Payer: Self-pay | Admitting: *Deleted

## 2021-10-28 NOTE — Telephone Encounter (Signed)
Patient called requesting a new handicap sticker application as his expires in 2 days. He would like to pick this up this afternoon if at all possible. I explained to him that it might not be today and he said he understands and to call him when ready

## 2021-10-28 NOTE — Telephone Encounter (Signed)
Handicap paperwork has been filled out and signed per Dr. Janese Banks. Pt is aware paperwork being done and will pick up sometime tomorrow.

## 2021-11-14 DIAGNOSIS — L270 Generalized skin eruption due to drugs and medicaments taken internally: Secondary | ICD-10-CM | POA: Diagnosis not present

## 2021-11-14 DIAGNOSIS — X32XXXA Exposure to sunlight, initial encounter: Secondary | ICD-10-CM | POA: Diagnosis not present

## 2021-11-14 DIAGNOSIS — L570 Actinic keratosis: Secondary | ICD-10-CM | POA: Diagnosis not present

## 2021-11-14 DIAGNOSIS — Z08 Encounter for follow-up examination after completed treatment for malignant neoplasm: Secondary | ICD-10-CM | POA: Diagnosis not present

## 2021-11-14 DIAGNOSIS — Z85828 Personal history of other malignant neoplasm of skin: Secondary | ICD-10-CM | POA: Diagnosis not present

## 2021-11-14 DIAGNOSIS — D044 Carcinoma in situ of skin of scalp and neck: Secondary | ICD-10-CM | POA: Diagnosis not present

## 2021-11-14 DIAGNOSIS — D485 Neoplasm of uncertain behavior of skin: Secondary | ICD-10-CM | POA: Diagnosis not present

## 2021-11-15 DIAGNOSIS — Z79899 Other long term (current) drug therapy: Secondary | ICD-10-CM | POA: Diagnosis not present

## 2021-12-13 DIAGNOSIS — Z79899 Other long term (current) drug therapy: Secondary | ICD-10-CM | POA: Diagnosis not present

## 2021-12-13 DIAGNOSIS — Z23 Encounter for immunization: Secondary | ICD-10-CM | POA: Diagnosis not present

## 2021-12-13 DIAGNOSIS — G720 Drug-induced myopathy: Secondary | ICD-10-CM | POA: Diagnosis not present

## 2021-12-13 DIAGNOSIS — E782 Mixed hyperlipidemia: Secondary | ICD-10-CM | POA: Diagnosis not present

## 2021-12-13 DIAGNOSIS — I10 Essential (primary) hypertension: Secondary | ICD-10-CM | POA: Diagnosis not present

## 2021-12-13 DIAGNOSIS — T466X5A Adverse effect of antihyperlipidemic and antiarteriosclerotic drugs, initial encounter: Secondary | ICD-10-CM | POA: Diagnosis not present

## 2021-12-13 DIAGNOSIS — R739 Hyperglycemia, unspecified: Secondary | ICD-10-CM | POA: Diagnosis not present

## 2021-12-13 DIAGNOSIS — C676 Malignant neoplasm of ureteric orifice: Secondary | ICD-10-CM | POA: Diagnosis not present

## 2021-12-16 DIAGNOSIS — Z515 Encounter for palliative care: Secondary | ICD-10-CM | POA: Diagnosis not present

## 2021-12-16 DIAGNOSIS — I10 Essential (primary) hypertension: Secondary | ICD-10-CM | POA: Diagnosis not present

## 2021-12-23 ENCOUNTER — Ambulatory Visit
Admission: RE | Admit: 2021-12-23 | Discharge: 2021-12-23 | Disposition: A | Discharge status: Home / Self Care | Payer: PPO | Source: Ambulatory Visit | Attending: Oncology | Admitting: Oncology

## 2021-12-23 DIAGNOSIS — K5730 Diverticulosis of large intestine without perforation or abscess without bleeding: Secondary | ICD-10-CM | POA: Diagnosis not present

## 2021-12-23 DIAGNOSIS — C669 Malignant neoplasm of unspecified ureter: Secondary | ICD-10-CM | POA: Diagnosis not present

## 2021-12-23 DIAGNOSIS — I700 Atherosclerosis of aorta: Secondary | ICD-10-CM | POA: Insufficient documentation

## 2021-12-23 DIAGNOSIS — K8020 Calculus of gallbladder without cholecystitis without obstruction: Secondary | ICD-10-CM | POA: Insufficient documentation

## 2021-12-23 DIAGNOSIS — R911 Solitary pulmonary nodule: Secondary | ICD-10-CM | POA: Diagnosis not present

## 2021-12-23 DIAGNOSIS — I2510 Atherosclerotic heart disease of native coronary artery without angina pectoris: Secondary | ICD-10-CM | POA: Insufficient documentation

## 2021-12-23 DIAGNOSIS — K862 Cyst of pancreas: Secondary | ICD-10-CM | POA: Diagnosis not present

## 2021-12-23 DIAGNOSIS — C661 Malignant neoplasm of right ureter: Secondary | ICD-10-CM | POA: Diagnosis not present

## 2021-12-23 DIAGNOSIS — Z5111 Encounter for antineoplastic chemotherapy: Secondary | ICD-10-CM | POA: Diagnosis not present

## 2021-12-23 DIAGNOSIS — C7800 Secondary malignant neoplasm of unspecified lung: Secondary | ICD-10-CM | POA: Diagnosis not present

## 2021-12-23 MED ORDER — IOHEXOL 300 MG/ML  SOLN
100.0000 mL | Freq: Once | INTRAMUSCULAR | Status: AC | PRN
  Administered 2021-12-23: 100 mL via INTRAVENOUS

## 2021-12-25 ENCOUNTER — Inpatient Hospital Stay: Payer: PPO | Attending: Oncology

## 2021-12-25 ENCOUNTER — Inpatient Hospital Stay (HOSPITAL_BASED_OUTPATIENT_CLINIC_OR_DEPARTMENT_OTHER): Payer: PPO | Admitting: Oncology

## 2021-12-25 ENCOUNTER — Encounter: Payer: Self-pay | Admitting: Oncology

## 2021-12-25 ENCOUNTER — Telehealth: Payer: Self-pay

## 2021-12-25 VITALS — BP 146/84 | HR 69 | Resp 18 | Wt 194.2 lb

## 2021-12-25 DIAGNOSIS — L309 Dermatitis, unspecified: Secondary | ICD-10-CM | POA: Diagnosis not present

## 2021-12-25 DIAGNOSIS — C7801 Secondary malignant neoplasm of right lung: Secondary | ICD-10-CM | POA: Diagnosis not present

## 2021-12-25 DIAGNOSIS — Z7189 Other specified counseling: Secondary | ICD-10-CM

## 2021-12-25 DIAGNOSIS — C669 Malignant neoplasm of unspecified ureter: Secondary | ICD-10-CM

## 2021-12-25 DIAGNOSIS — Z85828 Personal history of other malignant neoplasm of skin: Secondary | ICD-10-CM | POA: Diagnosis not present

## 2021-12-25 DIAGNOSIS — I2510 Atherosclerotic heart disease of native coronary artery without angina pectoris: Secondary | ICD-10-CM | POA: Insufficient documentation

## 2021-12-25 DIAGNOSIS — Z95828 Presence of other vascular implants and grafts: Secondary | ICD-10-CM

## 2021-12-25 DIAGNOSIS — I700 Atherosclerosis of aorta: Secondary | ICD-10-CM | POA: Diagnosis not present

## 2021-12-25 DIAGNOSIS — Z87440 Personal history of urinary (tract) infections: Secondary | ICD-10-CM | POA: Diagnosis not present

## 2021-12-25 DIAGNOSIS — C661 Malignant neoplasm of right ureter: Secondary | ICD-10-CM | POA: Insufficient documentation

## 2021-12-25 DIAGNOSIS — C778 Secondary and unspecified malignant neoplasm of lymph nodes of multiple regions: Secondary | ICD-10-CM | POA: Diagnosis not present

## 2021-12-25 DIAGNOSIS — Z882 Allergy status to sulfonamides status: Secondary | ICD-10-CM | POA: Insufficient documentation

## 2021-12-25 DIAGNOSIS — Z87442 Personal history of urinary calculi: Secondary | ICD-10-CM | POA: Insufficient documentation

## 2021-12-25 DIAGNOSIS — Z79899 Other long term (current) drug therapy: Secondary | ICD-10-CM | POA: Insufficient documentation

## 2021-12-25 DIAGNOSIS — Z885 Allergy status to narcotic agent status: Secondary | ICD-10-CM | POA: Diagnosis not present

## 2021-12-25 DIAGNOSIS — Z8249 Family history of ischemic heart disease and other diseases of the circulatory system: Secondary | ICD-10-CM | POA: Insufficient documentation

## 2021-12-25 DIAGNOSIS — K8020 Calculus of gallbladder without cholecystitis without obstruction: Secondary | ICD-10-CM | POA: Diagnosis not present

## 2021-12-25 DIAGNOSIS — Z8042 Family history of malignant neoplasm of prostate: Secondary | ICD-10-CM | POA: Diagnosis not present

## 2021-12-25 LAB — COMPREHENSIVE METABOLIC PANEL
ALT: 22 U/L (ref 0–44)
AST: 38 U/L (ref 15–41)
Albumin: 3.5 g/dL (ref 3.5–5.0)
Alkaline Phosphatase: 69 U/L (ref 38–126)
Anion gap: 7 (ref 5–15)
BUN: 17 mg/dL (ref 8–23)
CO2: 25 mmol/L (ref 22–32)
Calcium: 8.4 mg/dL — ABNORMAL LOW (ref 8.9–10.3)
Chloride: 103 mmol/L (ref 98–111)
Creatinine, Ser: 0.9 mg/dL (ref 0.61–1.24)
GFR, Estimated: >60 mL/min (ref >60)
Glucose, Bld: 151 mg/dL — ABNORMAL HIGH (ref 70–99)
Potassium: 3.9 mmol/L (ref 3.5–5.1)
Sodium: 135 mmol/L (ref 135–145)
Total Bilirubin: 0.6 mg/dL (ref 0.3–1.2)
Total Protein: 6.4 g/dL — ABNORMAL LOW (ref 6.5–8.1)

## 2021-12-25 LAB — CBC WITH DIFFERENTIAL/PLATELET
Abs Immature Granulocytes: 0.03 10*3/uL (ref 0.00–0.07)
Basophils Absolute: 0 10*3/uL (ref 0.0–0.1)
Basophils Relative: 1 %
Eosinophils Absolute: 0.1 10*3/uL (ref 0.0–0.5)
Eosinophils Relative: 2 %
HCT: 40.9 % (ref 39.0–52.0)
Hemoglobin: 14.4 g/dL (ref 13.0–17.0)
Immature Granulocytes: 1 %
Lymphocytes Relative: 31 %
Lymphs Abs: 1.5 10*3/uL (ref 0.7–4.0)
MCH: 31.9 pg (ref 26.0–34.0)
MCHC: 35.2 g/dL (ref 30.0–36.0)
MCV: 90.5 fL (ref 80.0–100.0)
Monocytes Absolute: 0.5 10*3/uL (ref 0.1–1.0)
Monocytes Relative: 10 %
Neutro Abs: 2.8 10*3/uL (ref 1.7–7.7)
Neutrophils Relative %: 55 %
Platelets: 285 10*3/uL (ref 150–400)
RBC: 4.52 MIL/uL (ref 4.22–5.81)
RDW: 13.8 % (ref 11.5–15.5)
WBC: 4.9 10*3/uL (ref 4.0–10.5)
nRBC: 0 % (ref 0.0–0.2)

## 2021-12-25 MED ORDER — HEPARIN SOD (PORK) LOCK FLUSH 100 UNIT/ML IV SOLN
500.0000 [IU] | Freq: Once | INTRAVENOUS | Status: DC
  Filled 2021-12-25: qty 5

## 2021-12-25 MED ORDER — SODIUM CHLORIDE 0.9% FLUSH
10.0000 mL | Freq: Once | INTRAVENOUS | Status: AC
  Administered 2021-12-25: 10 mL via INTRAVENOUS
  Filled 2021-12-25: qty 10

## 2021-12-25 MED ORDER — SODIUM CHLORIDE 0.9% FLUSH
10.0000 mL | Freq: Once | INTRAVENOUS | Status: DC
  Filled 2021-12-25: qty 10

## 2021-12-25 MED ORDER — LIDOCAINE-PRILOCAINE 2.5-2.5 % EX CREA: TOPICAL_CREAM | CUTANEOUS | 3 refills | Status: DC

## 2021-12-25 MED ORDER — HEPARIN SOD (PORK) LOCK FLUSH 100 UNIT/ML IV SOLN
500.0000 [IU] | Freq: Once | INTRAVENOUS | Status: AC
  Administered 2021-12-25: 500 [IU] via INTRAVENOUS
  Filled 2021-12-25: qty 5

## 2021-12-25 MED ORDER — ONDANSETRON HCL 8 MG PO TABS: 8.0000 mg | ORAL_TABLET | Freq: Three times a day (TID) | ORAL | 1 refills | Status: DC | PRN

## 2021-12-25 MED ORDER — PROCHLORPERAZINE MALEATE 10 MG PO TABS: 10.0000 mg | ORAL_TABLET | Freq: Four times a day (QID) | ORAL | 1 refills | Status: DC | PRN

## 2021-12-25 NOTE — Progress Notes (Signed)
Pt states he has bene experiencing lots of hip pain during the night; spoke to Dr. Doy Hutching about it and believes it is a mental issue. Pt took IMIPRAMINE for a week since 12/13/21 but states he is not able to stay awake so he has stopped taking it. Believe he becomes anxious before coming into appts and is not able to sleep.

## 2021-12-25 NOTE — Telephone Encounter (Signed)
Called pt to make him aware that Dr. Janese Banks and Dr. Evorn Gong spoke and they agree pt can start [place]. If pt is to devleop a rash once agan Dr. Evorn Gong will send in a new rx for methotraxte. Pt did not answer; but left VM explaining details and requested a call back so I know pt understood.

## 2021-12-25 NOTE — Progress Notes (Signed)
DISCONTINUE ON PATHWAY REGIMEN - Bladder     A cycle is every 28 days:     Enfortumab vedotin-ejfv   **Always confirm dose/schedule in your pharmacy ordering system**  REASON: Toxicities / Adverse Event PRIOR TREATMENT: [id number]: Enfortumab vedotin 1.25 mg/kg D1, 8, 15 q28 Days Until Progression or Unacceptable Toxicity TREATMENT RESPONSE: Progressive Disease (PD)  START OFF PATHWAY REGIMEN - Bladder   [id number]:Pembrolizumab 200 mg IV D1 q21 Days:   A cycle is every 21 days:     Pembrolizumab   **Always confirm dose/schedule in your pharmacy ordering system**  Patient Characteristics: Advanced/Metastatic Disease, Third Line and Beyond Therapeutic Status: Advanced/Metastatic Disease Line of Therapy: Third Line and Beyond  Intent of Therapy: Non-Curative / Palliative Intent, Discussed with Patient

## 2021-12-25 NOTE — Telephone Encounter (Signed)
PA for Tomasita Morrow has been sent via CoverMyMeds KEY: [id number]  PA for Lidocaine-Prilocaine 2.5% has been sent via CoverMyMeds KEY: [id number]   PA for [id number] is a duplicate for Lidocaine Prilocaine.

## 2021-12-25 NOTE — Progress Notes (Signed)
Hematology/Oncology Consult note [facility]  [phone] [phone]  Patient Care Team: Idelle Crouch, MD as PCP - General (Internal Medicine) Sindy Guadeloupe, MD as Consulting Physician (Hematology and Oncology) Sindy Guadeloupe, MD as Consulting Physician (Hematology and Oncology) Sindy Guadeloupe, MD as Consulting Physician (Hematology and Oncology) Dasher, Rayvon Char, MD as Consulting Physician (Dermatology)   Name of the patient: Anthony Gonzales  [id number]  14-Apr-1931   Date of visit: 12/25/21  Diagnosis-  Metastatic urothelial carcinoma with possible mets to the obturator node. Indeterminate hilar lymph nodes and LUL lung nodule      Chief complaint/ Reason for visit-discuss CT scan results and further management  Heme/Onc history: patient is a 88 year old male with past medical history significant for hypertension and long-standing history of superficial bladder cancer for which he sees [name].  He has undergone TURBT as well as ureteroscopy since 2017 along with mitomycin as well in the past.  Most recently he underwent CT abdomen on 08/06/2017 which showed a soft tissue mass in the right renal pelvis concerning for upper tract urothelial neoplasm.  Soft tissue fullness at the right ureterovesical junction with proximal right hydroureteronephrosis.  Several millimeter attenuation lesion in the pancreatic head.   He underwent diagnostic ureteroscopy and was found to have 2 high-grade lesions within his right kidney.  There was a nodular high-grade appearing lesion in the right anterior renal pelvis.  There was also a second ureteral tumor fungating from the right ureteral orifice extending into the distal ureter also consistent with high-grade invasive urothelial carcinoma.  Muscle invasion could not be assessed.  He also underwent a CT chest which showed a rounded nodule in the right upper lobe measuring 14 mm concerning for  metastases.  2 other 3 mm lesions were also noted in the left upper lobe likely benign   Plan initially was neoadjuvant chemotherapy followed by possible surgery but given the presence of lung lesion patient has been referred to oncology for the same.   PET/CT on 09/21/17 showed: IMPRESSION: 1. Hypermetabolic right obturator lymph node is most indicative of metastatic disease. 2. Mildly hypermetabolic left hilar lymph nodes are nonspecific. Continued attention on follow-up exams is warranted. 3. Right upper lobe pulmonary nodule shows metabolism after just above blood pool and is therefore indeterminate. Continued attention on follow-up exams is warranted. 4. Aortic atherosclerosis (ICD10-170.0). Coronary artery calcification. 5. Cholelithiasis    Carboplatin/ gemzar 1 week on and one-week off as patient could not tolerate 2-week on and one week off regimen.  Cycle 1 started on 09/22/2017 Disease progression in March 2020.  Switched to second [employer]   Patient had disease controlled with Keytruda.  However he was noted to have worsening dermatitis with constant flareups requiring steroids.  Beryle Flock is therefore being kept on hold and patient will be switched to third line Padcev.    Treatment has been on hold since February 2021 due to constant flareup of skin rash   Scans in October 2022 showed disease progression with new lung nodules as well as pelvic sidewall adenopathy.  Padcev restarted in November 2022.  Skin rash and blurry vision secondary to Padcev which was put on hold starting May 2023  Interval history-patient came off steroids a few days ago.  He will also be seeing Dr. Evorn Gong soon to take out nonmelanoma to skin cancers out of his scalp.  Denies any new areas of rash at this time.  ECOG PS- 1  Pain scale- 0   Review of systems- Review of Systems  Constitutional:  Negative for chills, fever, malaise/fatigue and weight loss.  HENT:  Negative for congestion, ear  discharge and nosebleeds.   Eyes:  Negative for blurred vision.  Respiratory:  Negative for cough, hemoptysis, sputum production, shortness of breath and wheezing.   Cardiovascular:  Negative for chest pain, palpitations, orthopnea and claudication.  Gastrointestinal:  Negative for abdominal pain, blood in stool, constipation, diarrhea, heartburn, melena, nausea and vomiting.  Genitourinary:  Negative for dysuria, flank pain, frequency, hematuria and urgency.  Musculoskeletal:  Negative for back pain, joint pain and myalgias.  Skin:  Negative for rash.  Neurological:  Negative for dizziness, tingling, focal weakness, seizures, weakness and headaches.  Endo/Heme/Allergies:  Does not bruise/bleed easily.  Psychiatric/Behavioral:  Negative for depression and suicidal ideas. The patient does not have insomnia.       Allergies  Allergen Reactions   Demerol [Meperidine] Nausea And Vomiting   Lipitor [Atorvastatin] Swelling   Sulfa Antibiotics Nausea And Vomiting and Rash     Past Medical History:  Diagnosis Date   Arthritis    Benign fibroma of prostate 08/23/2013   BPH (benign prostatic hyperplasia)    Calculus of kidney 08/23/2013   Glaucoma    no drops in 3 mo pressure good, pt denies glaucoma, eye pressure has been measuring alright.   History of kidney stones    HLD (hyperlipidemia)    HOH (hard of hearing)    Left Hearing Aid   HTN (hypertension) 12/26/2014   Hypertension    Hyponatremia 12/26/2014   Migraines    history of migraines when he was younger.   Restless leg syndrome    Sinus drainage    Skin cancer    Skin cancer    left hand 06/2019   Urothelial cancer ([place])    chemo tx's.   UTI (lower urinary tract infection) 12/26/2014   Vertigo      Past Surgical History:  Procedure Laterality Date   COLONOSCOPY     CYSTOSCOPY W/ RETROGRADES Right 01/30/2015   Procedure: CYSTOSCOPY WITH RETROGRADE PYELOGRAM;  Surgeon: [name], MD;  Location: [facility];   Service: Urology;  Laterality: Right;   CYSTOSCOPY W/ RETROGRADES Bilateral 02/26/2016   Procedure: CYSTOSCOPY WITH RETROGRADE PYELOGRAM;  Surgeon: [name], MD;  Location: [facility];  Service: Urology;  Laterality: Bilateral;   CYSTOSCOPY W/ URETERAL STENT PLACEMENT Right 08/20/2015   Procedure: CYSTOSCOPY WITH RETROGRADE PYELOGRAM/POSSIBLE URETERAL STENT PLACEMENT/BLADDER BIOPSY;  Surgeon: [name], MD;  Location: [facility];  Service: Urology;  Laterality: Right;   CYSTOSCOPY W/ URETERAL STENT PLACEMENT Right 09/12/2015   Procedure: CYSTOSCOPY WITH STENT REPLACEMENT;  Surgeon: [name], MD;  Location: [facility];  Service: Urology;  Laterality: Right;   CYSTOSCOPY WITH BIOPSY Right 09/12/2015   Procedure: CYSTOSCOPY WITH BLADDER AND URETERAL BIOPSY;  Surgeon: [name], MD;  Location: [facility];  Service: Urology;  Laterality: Right;   CYSTOSCOPY WITH STENT PLACEMENT Right 01/30/2015   Procedure: CYSTOSCOPY WITH STENT PLACEMENT;  Surgeon: [name], MD;  Location: [facility];  Service: Urology;  Laterality: Right;   CYSTOSCOPY WITH STENT PLACEMENT Right 12/28/2017   Procedure: CYSTOSCOPY WITH STENT Exchange;  Surgeon: [name], MD;  Location: [facility];  Service: Urology;  Laterality: Right;   CYSTOSCOPY/URETEROSCOPY/HOLMIUM LASER/STENT PLACEMENT Right 08/19/2017   Procedure: CYSTOSCOPY/URETEROSCOPY/HOLMIUM LASER/STENT PLACEMENT;  Surgeon: [name], MD;  Location: [facility];  Service: Urology;  Laterality: Right;   EYE SURGERY Bilateral  Cataract Extraction with IOL   goiter removal     HOLMIUM LASER APPLICATION N/A 3/82/5053   Procedure:  HOLMIUM LASER APPLICATION;  Surgeon: [name], MD;  Location: [facility];  Service: Urology;  Laterality: N/A;   PORTA CATH INSERTION N/A 09/23/2017   Procedure: PORTA CATH INSERTION;  Surgeon: Algernon Huxley, MD;  Location: [place] [facility];  Service: Cardiovascular;  Laterality: N/A;   SPERMATOCELECTOMY     TONSILLECTOMY      TRANSURETHRAL RESECTION OF BLADDER TUMOR N/A 02/26/2016   Procedure: TRANSURETHRAL RESECTION OF BLADDER TUMOR (TURBT);  Surgeon: [name], MD;  Location: [facility];  Service: Urology;  Laterality: N/A;   TRANSURETHRAL RESECTION OF BLADDER TUMOR WITH MITOMYCIN-C N/A 09/12/2015   Procedure: TRANSURETHRAL RESECTION OF BLADDER TUMOR ;  Surgeon: [name], MD;  Location: [facility];  Service: Urology;  Laterality: N/A;   TRANSURETHRAL RESECTION OF BLADDER TUMOR WITH MITOMYCIN-C N/A 03/24/2016   Procedure: TRANSURETHRAL RESECTION OF BLADDER TUMOR WITH MITOMYCIN-C  (SMALL);  Surgeon: [name], MD;  Location: [facility];  Service: Urology;  Laterality: N/A;   URETERAL BIOPSY Right 08/19/2017   Procedure: Renal Mass BIOPSY;  Surgeon: [name], MD;  Location: [facility];  Service: Urology;  Laterality: Right;   URETEROSCOPY Right 01/30/2015   Procedure: URETEROSCOPY/ WITH BIOPSY AND CYTOLOGY BRUSHING;  Surgeon: [name], MD;  Location: [facility];  Service: Urology;  Laterality: Right;   URETEROSCOPY Right 08/20/2015   Procedure: URETEROSCOPY;  Surgeon: [name], MD;  Location: [facility];  Service: Urology;  Laterality: Right;   URETEROSCOPY Right 09/12/2015   Procedure: URETEROSCOPY;  Surgeon: [name], MD;  Location: [facility];  Service: Urology;  Laterality: Right;   URETEROSCOPY Right 02/26/2016   Procedure: URETEROSCOPY;  Surgeon: [name], MD;  Location: [facility];  Service: Urology;  Laterality: Right;    Social History   Socioeconomic History   Marital status: Married    Spouse name: Not on file   Number of children: Not on file   Years of education: Not on file   Highest education level: Not on file  Occupational History   Not on file  Tobacco Use   Smoking status: Never   Smokeless tobacco: Never  Vaping Use   Vaping Use: Never used  Substance and Sexual Activity   Alcohol use: No    Alcohol/week: 0.0 standard drinks of alcohol   Drug use: No    Sexual activity: Not Currently  Other Topics Concern   Not on file  Social History Narrative   Not on file   Social Determinants of Health   Financial Resource Strain: Not on file  Food Insecurity: Not on file  Transportation Needs: Not on file  Physical Activity: Not on file  Stress: Not on file  Social Connections: Not on file  Intimate Partner Violence: Not on file    Family History  Problem Relation Age of Onset   Hypertension Mother    Hypertension Father    Prostate cancer Brother    Kidney disease Neg Hx    Kidney cancer Neg Hx    Bladder Cancer Neg Hx      Current Outpatient Medications:    acetaminophen (TYLENOL) 500 MG tablet, Take 500 mg by mouth every 6 (six) hours as needed., Disp: , Rfl:    amLODipine (NORVASC) 5 MG tablet, , Disp: , Rfl:    aspirin EC 325 MG tablet, Take 325 mg by mouth daily., Disp: , Rfl:    camphor-menthol (  SARNA) lotion, Apply 1 application topically as needed for itching., Disp: , Rfl:    diazepam (VALIUM) 5 MG tablet, TAKE 1 TABLET (5 MG TOTAL) BY MOUTH EVERY 12 (TWELVE) HOURS AS NEEDED FOR ANXIETY OR SLEEP, Disp: , Rfl:    fenofibrate micronized (LOFIBRA) 134 MG capsule, Take 134 mg by mouth daily before breakfast. , Disp: , Rfl:    folic acid (FOLVITE) 1 MG tablet, Take by mouth., Disp: , Rfl:    hydrOXYzine (ATARAX) 10 MG tablet, Take 1 tablet (10 mg total) by mouth 3 (three) times daily as needed for itching., Disp: 120 tablet, Rfl: 0   imipramine (TOFRANIL) 10 MG tablet, Take 10 mg by mouth at bedtime., Disp: , Rfl:    Multiple Vitamin (MULTIVITAMIN WITH MINERALS) TABS tablet, Take 1 tablet by mouth daily., Disp: , Rfl:    polyethylene glycol (MIRALAX / GLYCOLAX) 17 g packet, Take 17 g by mouth daily., Disp: , Rfl:    psyllium (METAMUCIL) 58.6 % packet, Take 1 packet by mouth daily., Disp: , Rfl:    senna (SENOKOT) 8.6 MG tablet, Take 2 tablets by mouth daily., Disp: , Rfl:    tamsulosin (FLOMAX) 0.4 MG CAPS capsule, Take 1 capsule  (0.4 mg total) by mouth daily., Disp: 90 capsule, Rfl: 3   triamcinolone cream (KENALOG) 0.1 %, Apply 1 application. topically 2 (two) times daily., Disp: , Rfl:    pantoprazole (PROTONIX) 20 MG tablet, Take 1 tablet (20 mg total) by mouth daily for 14 days., Disp: 14 tablet, Rfl: 0   prochlorperazine (COMPAZINE) 10 MG tablet, Take 1 tablet (10 mg total) by mouth every 6 (six) hours as needed for nausea or vomiting. (Patient not taking: Reported on 07/01/2021), Disp: 30 tablet, Rfl: 0 No current facility-administered medications for this visit.  Facility-Administered Medications Ordered in Other Visits:    heparin lock flush 100 UNIT/ML injection, , , ,    heparin lock flush 100 UNIT/ML injection, , , ,    heparin lock flush 100 unit/mL, 500 Units, Intravenous, Once, Sindy Guadeloupe, MD   sodium chloride flush (NS) 0.9 % injection 10 mL, 10 mL, Intravenous, Once, Sindy Guadeloupe, MD   sodium chloride flush (NS) 0.9 % injection 10 mL, 10 mL, Intravenous, PRN, Sindy Guadeloupe, MD   sodium chloride flush (NS) 0.9 % injection 10 mL, 10 mL, Intravenous, PRN, Sindy Guadeloupe, MD, 10 mL at 08/21/21 0841  Physical exam:  Vitals:   12/25/21 0935  BP: (!) 146/84  Pulse: 69  Resp: 18  SpO2: 96%  Weight: 194 lb 3.2 oz (88.1 kg)   Physical Exam Constitutional:      General: He is not in acute distress. Cardiovascular:     Rate and Rhythm: Normal rate and regular rhythm.     Heart sounds: Normal heart sounds.  Pulmonary:     Effort: Pulmonary effort is normal.     Breath sounds: Normal breath sounds.  Abdominal:     General: Bowel sounds are normal.     Palpations: Abdomen is soft.  Skin:    General: Skin is warm and dry.  Neurological:     Mental Status: He is alert and oriented to person, place, and time.         Latest Ref Rng & Units 12/25/2021    9:14 AM  CMP  Glucose 70 - 99 mg/dL 151   BUN 8 - 23 mg/dL 17   Creatinine 0.61 - 1.24 mg/dL 0.90   Sodium  135 - 145 mmol/L 135    Potassium 3.5 - 5.1 mmol/L 3.9   Chloride 98 - 111 mmol/L 103   CO2 22 - 32 mmol/L 25   Calcium 8.9 - 10.3 mg/dL 8.4   Total Protein 6.5 - 8.1 g/dL 6.4   Total Bilirubin 0.3 - 1.2 mg/dL 0.6   Alkaline Phos 38 - 126 U/L 69   AST 15 - 41 U/L 38   ALT 0 - 44 U/L 22       Latest Ref Rng & Units 12/25/2021    9:14 AM  CBC  WBC 4.0 - 10.5 K/uL 4.9   Hemoglobin 13.0 - 17.0 g/dL 14.4   Hematocrit 39.0 - 52.0 % 40.9   Platelets 150 - 400 K/uL 285     No images are attached to the encounter.  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 Restaging. * Tracking Code: BO * EXAM: CT CHEST, ABDOMEN, AND PELVIS WITH CONTRAST TECHNIQUE: Multidetector CT imaging of the chest, abdomen and pelvis was performed following the standard protocol during bolus administration of intravenous contrast. RADIATION DOSE REDUCTION: This exam was performed according to the departmental dose-optimization program which includes automated exposure control, adjustment of the mA and/or kV according to patient size and/or use of iterative reconstruction technique. CONTRAST:  174mL OMNIPAQUE IOHEXOL 300 MG/ML  SOLN COMPARISON:  09/13/2021 CT chest, abdomen and pelvis. FINDINGS: CT CHEST FINDINGS Cardiovascular: Normal heart size. No significant pericardial effusion/thickening. Left anterior descending coronary atherosclerosis. Right internal jugular Port-A-Cath terminates in the middle third of the SVC. Atherosclerotic nonaneurysmal thoracic aorta. Normal caliber pulmonary arteries. No central pulmonary emboli. Mediastinum/Nodes: No significant thyroid nodules. Unremarkable esophagus. No pathologically enlarged axillary, mediastinal or hilar lymph nodes. Lungs/Pleura: No pneumothorax. No pleural effusion. Solid 1.3 x 1.0 cm medial right upper lobe pulmonary nodule, previously 1.3 x 1.0 cm using similar measurement  technique, stable. Solid anterior left lower lobe 1.1 x 0.9 cm pulmonary nodule along the major fissure (series 3/image 97), previously 0.3 x 0.2 cm, significantly increased. No acute consolidative airspace disease or new significant pulmonary nodules. Musculoskeletal: No aggressive appearing focal osseous lesions. Mild thoracic spondylosis. Symmetric mild bilateral gynecomastia, stable. CT ABDOMEN PELVIS FINDINGS Hepatobiliary: Normal liver with no liver mass. No biliary ductal dilatation. Cholelithiasis. Pancreas: Small cystic pancreatic lesions in the pancreatic head, largest 1.0 cm (series 2/image 84), stable. No new pancreatic lesions. No pancreatic duct dilation. Spleen: Normal size. No mass. Adrenals/Urinary Tract: Stable adrenals without discrete adrenal nodules. Simple 3.8 cm anterior lower right renal cyst and additional scattered subcentimeter hypodense left renal cortical lesions that are too small to characterize, for which no follow-up imaging is recommended. No suspicious renal masses. No hydronephrosis. Normal caliber ureters. Stable small posterior left bladder wall 1.7 cm diverticulum. Otherwise normal bladder. Stomach/Bowel: Grossly normal nondistended stomach. Normal caliber small bowel with no small bowel wall thickening. Normal appendix. Moderate sigmoid diverticulosis with no large bowel wall thickening or significant pericolonic fat stranding. Vascular/Lymphatic: Atherosclerotic nonaneurysmal abdominal aorta. Patent portal, splenic, hepatic and renal veins. Right posterior pelvic sidewall 2.1 x 1.5 cm nodal mass (series 2/image 118), previously 1.5 x 1.3 cm, increased. Otherwise no pathologically enlarged lymph nodes in the abdomen or pelvis. Reproductive: Top-normal size prostate with nonspecific heterogeneous internal prostatic calcifications. Other: No pneumoperitoneum, ascites or focal fluid collection. Musculoskeletal: No aggressive appearing focal osseous lesions. Marked lumbar  spondylosis. IMPRESSION: 1. Significant interval growth of a  solid 1.1 cm anterior left lower lobe pulmonary nodule along the major fissure, previously 0.3 cm, compatible with pulmonary metastasis. Right upper lobe 1.3 cm pulmonary metastasis is stable. 2. Interval growth of right posterior pelvic sidewall 2.1 cm nodal mass, compatible with metastasis. 3. Chronic findings include: Coronary atherosclerosis. Stable small cystic pancreatic lesions in the pancreatic head, largest 1.0 cm, stable. No biliary or pancreatic duct dilation. Cholelithiasis. Moderate sigmoid diverticulosis. Aortic Atherosclerosis (ICD10-I70.0). Electronically Signed   By: Ilona Sorrel M.D.   On: 12/23/2021 16:57     Assessment and plan- Patient is a 88 y.o. male with metastatic urothelial cancer with lung and lymph node metastases here to discuss further management  Patient had disease progression on first-line carboplatin gemcitabine.  Following that he was switched to [facility] and responded very well to treatment.  Treatment was stopped in October 2020 as patient started developing skin rash which required frequent use of steroids.  He never developed actual disease progression on Keytruda.  At that time he was switched to [facility] which she has received on and off with good control of disease.  He last received Padcev in May 2023.  Treatment was complicated by blurry vision as well as skin rash which was fairly extensive especially on his forearms prompting stopping treatment.  His most recent CT scans again showedRegrowth of the lung nodule as well as pelvic side wall nodule.  We therefore need to restart some form of treatment  I am hesitant to put him back on Padcev given how extensive his forearm rash and erythema was and the fact that he also developed blurry vision despite dose reduction on Paxil.  I would like to rechallenge him with Keytruda at this time and see if he develops the rash again.  He will be closely followed by Dr.  Evorn Gong and I have personally spoken to Dr. Evorn Gong over the phone today as well.  If patient were to develop skin rash on Keytruda he will be started on low-dose methotrexate.  Although intuitively Keytruda and methotrexate could be potentially antagonizing each other as long as he responds to this treatment without any significant side effects I am inclined to do this instead of pursuing chemotherapy.  I will therefore see him back on 01/08/2022 with port labs CBC with differential CMP TSH to start first cycle of Keytruda.  Patient verbalized understanding of the plan   Visit Diagnosis 1. Goals of care, counseling/discussion   2. 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[place])      Dr. Randa Evens, MD, MPH [facility] [phone] 12/25/2021 10:19 AM

## 2021-12-31 ENCOUNTER — Ambulatory Visit: Payer: PPO | Admitting: Oncology

## 2021-12-31 ENCOUNTER — Other Ambulatory Visit: Payer: PPO

## 2022-01-02 DIAGNOSIS — D0439 Carcinoma in situ of skin of other parts of face: Secondary | ICD-10-CM | POA: Diagnosis not present

## 2022-01-02 DIAGNOSIS — D044 Carcinoma in situ of skin of scalp and neck: Secondary | ICD-10-CM | POA: Diagnosis not present

## 2022-01-08 ENCOUNTER — Inpatient Hospital Stay: Payer: PPO

## 2022-01-08 ENCOUNTER — Encounter: Payer: Self-pay | Admitting: Oncology

## 2022-01-08 ENCOUNTER — Inpatient Hospital Stay: Payer: PPO | Attending: Oncology

## 2022-01-08 ENCOUNTER — Inpatient Hospital Stay (HOSPITAL_BASED_OUTPATIENT_CLINIC_OR_DEPARTMENT_OTHER): Payer: PPO | Admitting: Oncology

## 2022-01-08 VITALS — Temp 96.5°F

## 2022-01-08 VITALS — BP 162/90 | HR 66 | Resp 18 | Wt 189.2 lb

## 2022-01-08 DIAGNOSIS — K8020 Calculus of gallbladder without cholecystitis without obstruction: Secondary | ICD-10-CM | POA: Insufficient documentation

## 2022-01-08 DIAGNOSIS — I2510 Atherosclerotic heart disease of native coronary artery without angina pectoris: Secondary | ICD-10-CM | POA: Diagnosis not present

## 2022-01-08 DIAGNOSIS — Z885 Allergy status to narcotic agent status: Secondary | ICD-10-CM | POA: Diagnosis not present

## 2022-01-08 DIAGNOSIS — K5730 Diverticulosis of large intestine without perforation or abscess without bleeding: Secondary | ICD-10-CM | POA: Diagnosis not present

## 2022-01-08 DIAGNOSIS — C771 Secondary and unspecified malignant neoplasm of intrathoracic lymph nodes: Secondary | ICD-10-CM | POA: Diagnosis not present

## 2022-01-08 DIAGNOSIS — Z8042 Family history of malignant neoplasm of prostate: Secondary | ICD-10-CM | POA: Diagnosis not present

## 2022-01-08 DIAGNOSIS — C669 Malignant neoplasm of unspecified ureter: Secondary | ICD-10-CM

## 2022-01-08 DIAGNOSIS — I700 Atherosclerosis of aorta: Secondary | ICD-10-CM | POA: Insufficient documentation

## 2022-01-08 DIAGNOSIS — Z87442 Personal history of urinary calculi: Secondary | ICD-10-CM | POA: Diagnosis not present

## 2022-01-08 DIAGNOSIS — Z8249 Family history of ischemic heart disease and other diseases of the circulatory system: Secondary | ICD-10-CM | POA: Diagnosis not present

## 2022-01-08 DIAGNOSIS — M47814 Spondylosis without myelopathy or radiculopathy, thoracic region: Secondary | ICD-10-CM | POA: Diagnosis not present

## 2022-01-08 DIAGNOSIS — L309 Dermatitis, unspecified: Secondary | ICD-10-CM | POA: Diagnosis not present

## 2022-01-08 DIAGNOSIS — Z85828 Personal history of other malignant neoplasm of skin: Secondary | ICD-10-CM | POA: Diagnosis not present

## 2022-01-08 DIAGNOSIS — C679 Malignant neoplasm of bladder, unspecified: Secondary | ICD-10-CM | POA: Diagnosis not present

## 2022-01-08 DIAGNOSIS — R5383 Other fatigue: Secondary | ICD-10-CM | POA: Insufficient documentation

## 2022-01-08 DIAGNOSIS — Z87440 Personal history of urinary (tract) infections: Secondary | ICD-10-CM | POA: Insufficient documentation

## 2022-01-08 DIAGNOSIS — I10 Essential (primary) hypertension: Secondary | ICD-10-CM | POA: Insufficient documentation

## 2022-01-08 DIAGNOSIS — Z79899 Other long term (current) drug therapy: Secondary | ICD-10-CM | POA: Insufficient documentation

## 2022-01-08 DIAGNOSIS — Z882 Allergy status to sulfonamides status: Secondary | ICD-10-CM | POA: Diagnosis not present

## 2022-01-08 DIAGNOSIS — Z5112 Encounter for antineoplastic immunotherapy: Secondary | ICD-10-CM | POA: Diagnosis not present

## 2022-01-08 DIAGNOSIS — C7802 Secondary malignant neoplasm of left lung: Secondary | ICD-10-CM | POA: Diagnosis not present

## 2022-01-08 DIAGNOSIS — K862 Cyst of pancreas: Secondary | ICD-10-CM | POA: Insufficient documentation

## 2022-01-08 DIAGNOSIS — M47816 Spondylosis without myelopathy or radiculopathy, lumbar region: Secondary | ICD-10-CM | POA: Diagnosis not present

## 2022-01-08 DIAGNOSIS — N62 Hypertrophy of breast: Secondary | ICD-10-CM | POA: Insufficient documentation

## 2022-01-08 DIAGNOSIS — Z95828 Presence of other vascular implants and grafts: Secondary | ICD-10-CM

## 2022-01-08 DIAGNOSIS — Z9221 Personal history of antineoplastic chemotherapy: Secondary | ICD-10-CM | POA: Insufficient documentation

## 2022-01-08 LAB — CBC WITH DIFFERENTIAL/PLATELET
Abs Immature Granulocytes: 0.03 10*3/uL (ref 0.00–0.07)
Basophils Absolute: 0.1 10*3/uL (ref 0.0–0.1)
Basophils Relative: 1 %
Eosinophils Absolute: 0.2 10*3/uL (ref 0.0–0.5)
Eosinophils Relative: 3 %
HCT: 40.9 % (ref 39.0–52.0)
Hemoglobin: 14.5 g/dL (ref 13.0–17.0)
Immature Granulocytes: 1 %
Lymphocytes Relative: 26 %
Lymphs Abs: 1.7 10*3/uL (ref 0.7–4.0)
MCH: 32.2 pg (ref 26.0–34.0)
MCHC: 35.5 g/dL (ref 30.0–36.0)
MCV: 90.9 fL (ref 80.0–100.0)
Monocytes Absolute: 0.5 10*3/uL (ref 0.1–1.0)
Monocytes Relative: 8 %
Neutro Abs: 3.9 10*3/uL (ref 1.7–7.7)
Neutrophils Relative %: 61 %
Platelets: 234 10*3/uL (ref 150–400)
RBC: 4.5 MIL/uL (ref 4.22–5.81)
RDW: 13.4 % (ref 11.5–15.5)
WBC: 6.4 10*3/uL (ref 4.0–10.5)
nRBC: 0 % (ref 0.0–0.2)

## 2022-01-08 LAB — COMPREHENSIVE METABOLIC PANEL
ALT: 21 U/L (ref 0–44)
AST: 34 U/L (ref 15–41)
Albumin: 3.5 g/dL (ref 3.5–5.0)
Alkaline Phosphatase: 70 U/L (ref 38–126)
Anion gap: 6 (ref 5–15)
BUN: 16 mg/dL (ref 8–23)
CO2: 26 mmol/L (ref 22–32)
Calcium: 8.7 mg/dL — ABNORMAL LOW (ref 8.9–10.3)
Chloride: 105 mmol/L (ref 98–111)
Creatinine, Ser: 0.78 mg/dL (ref 0.61–1.24)
GFR, Estimated: >60 mL/min (ref >60)
Glucose, Bld: 137 mg/dL — ABNORMAL HIGH (ref 70–99)
Potassium: 3.8 mmol/L (ref 3.5–5.1)
Sodium: 137 mmol/L (ref 135–145)
Total Bilirubin: 0.5 mg/dL (ref 0.3–1.2)
Total Protein: 6.6 g/dL (ref 6.5–8.1)

## 2022-01-08 LAB — TSH: TSH: 1.588 u[IU]/mL (ref 0.350–4.500)

## 2022-01-08 MED ORDER — HEPARIN SOD (PORK) LOCK FLUSH 100 UNIT/ML IV SOLN
500.0000 [IU] | Freq: Once | INTRAVENOUS | Status: AC | PRN
  Administered 2022-01-08: 500 [IU]
  Filled 2022-01-08: qty 5

## 2022-01-08 MED ORDER — SODIUM CHLORIDE 0.9 % IV SOLN
Freq: Once | INTRAVENOUS | Status: AC
  Filled 2022-01-08: qty 250

## 2022-01-08 MED ORDER — SODIUM CHLORIDE 0.9 % IV SOLN
200.0000 mg | Freq: Once | INTRAVENOUS | Status: AC
  Administered 2022-01-08: 200 mg via INTRAVENOUS
  Filled 2022-01-08: qty 8

## 2022-01-08 MED ORDER — SODIUM CHLORIDE 0.9% FLUSH
10.0000 mL | INTRAVENOUS | Status: DC | PRN
  Administered 2022-01-08: 10 mL via INTRAVENOUS
  Filled 2022-01-08: qty 10

## 2022-01-08 NOTE — Patient Instructions (Signed)
[facility]  Discharge Instructions: Thank you for choosing [facility] to provide your oncology and hematology care.  If you have a lab appointment with the Cancer Center, please go directly to the Cancer Center and check in at the registration area.  Wear comfortable clothing and clothing appropriate for easy access to any Portacath or PICC line.   We strive to give you quality time with your provider. You may need to reschedule your appointment if you arrive late (15 or more minutes).  Arriving late affects you and other patients whose appointments are after yours.  Also, if you miss three or more appointments without notifying the office, you may be dismissed from the clinic at the provider's discretion.      For prescription refill requests, have your pharmacy contact our office and allow 72 hours for refills to be completed.    Today you received the following chemotherapy and/or immunotherapy agents KEYTRUDA      To help prevent nausea and vomiting after your treatment, we encourage you to take your nausea medication as directed.  BELOW ARE SYMPTOMS THAT SHOULD BE REPORTED IMMEDIATELY: *FEVER GREATER THAN 100.4 F (38 C) OR HIGHER *CHILLS OR SWEATING *NAUSEA AND VOMITING THAT IS NOT CONTROLLED WITH YOUR NAUSEA MEDICATION *UNUSUAL SHORTNESS OF BREATH *UNUSUAL BRUISING OR BLEEDING *URINARY PROBLEMS (pain or burning when urinating, or frequent urination) *BOWEL PROBLEMS (unusual diarrhea, constipation, pain near the anus) TENDERNESS IN MOUTH AND THROAT WITH OR WITHOUT PRESENCE OF ULCERS (sore throat, sores in mouth, or a toothache) UNUSUAL RASH, SWELLING OR PAIN  UNUSUAL VAGINAL DISCHARGE OR ITCHING   Items with * indicate a potential emergency and should be followed up as soon as possible or go to the Emergency Department if any problems should occur.  Please show the CHEMOTHERAPY ALERT CARD or IMMUNOTHERAPY ALERT CARD at check-in to  the Emergency Department and triage nurse.  Should you have questions after your visit or need to cancel or reschedule your appointment, please contact [facility]  [phone] and follow the prompts.  Office hours are 8:00 a.m. to 4:30 p.m. Monday - Friday. Please note that voicemails left after 4:00 p.m. may not be returned until the following business day.  We are closed weekends and major holidays. You have access to a nurse at all times for urgent questions. Please call the main number to the clinic [phone] and follow the prompts.  For any non-urgent questions, you may also contact your provider using MyChart. We now offer e-Visits for anyone 18 and older to request care online for non-urgent symptoms. For details visit [email or web address].   Also download the MyChart app! Go to the app store, search "MyChart", open the app, select [facility], and log in with your MyChart username and password.  Masks are optional in the cancer centers. If you would like for your care team to wear a mask while they are taking care of you, please let them know. For doctor visits, patients may have with them one support person who is at least 86 years old. At this time, visitors are not allowed in the infusion area. Pembrolizumab Injection What is this medication? PEMBROLIZUMAB (PEM broe LIZ ue mab) treats some types of cancer. It works by helping your immune system slow or stop the spread of cancer cells. It is a monoclonal antibody. This medicine may be used for other purposes; ask your health care provider or pharmacist if you have questions. COMMON   BRAND NAME(S): Keytruda What should I tell my care team before I take this medication? They need to know if you have any of these conditions: Allogeneic stem cell transplant (uses someone else's stem cells) Autoimmune diseases, such as Crohn disease, ulcerative colitis, lupus History of chest radiation Nervous system  problems, such as Guillain-Barre syndrome, myasthenia gravis Organ transplant An unusual or allergic reaction to pembrolizumab, other medications, foods, dyes, or preservatives Pregnant or trying to get pregnant Breast-feeding How should I use this medication? This medication is injected into a vein. It is given by your care team in a hospital or clinic setting. A special MedGuide will be given to you before each treatment. Be sure to read this information carefully each time. Talk to your care team about the use of this medication in children. While it may be prescribed for children as young as 6 months for selected conditions, precautions do apply. Overdosage: If you think you have taken too much of this medicine contact a poison control center or emergency room at once. NOTE: This medicine is only for you. Do not share this medicine with others. What if I miss a dose? Keep appointments for follow-up doses. It is important not to miss your dose. Call your care team if you are unable to keep an appointment. What may interact with this medication? Interactions have not been studied. This list may not describe all possible interactions. Give your health care provider a list of all the medicines, herbs, non-prescription drugs, or dietary supplements you use. Also tell them if you smoke, drink alcohol, or use illegal drugs. Some items may interact with your medicine. What should I watch for while using this medication? Your condition will be monitored carefully while you are receiving this medication. You may need blood work while taking this medication. This medication may cause serious skin reactions. They can happen weeks to months after starting the medication. Contact your care team right away if you notice fevers or flu-like symptoms with a rash. The rash may be red or purple and then turn into blisters or peeling of the skin. You may also notice a red rash with swelling of the face, lips, or  lymph nodes in your neck or under your arms. Tell your care team right away if you have any change in your eyesight. Talk to your care team if you may be pregnant. Serious birth defects can occur if you take this medication during pregnancy and for 4 months after the last dose. You will need a negative pregnancy test before starting this medication. Contraception is recommended while taking this medication and for 4 months after the last dose. Your care team can help you find the option that works for you. Do not breastfeed while taking this medication and for 4 months after the last dose. What side effects may I notice from receiving this medication? Side effects that you should report to your care team as soon as possible: Allergic reactions--skin rash, itching, hives, swelling of the face, lips, tongue, or throat Dry cough, shortness of breath or trouble breathing Eye pain, redness, irritation, or discharge with blurry or decreased vision Heart muscle inflammation--unusual weakness or fatigue, shortness of breath, chest pain, fast or irregular heartbeat, dizziness, swelling of the ankles, feet, or hands Hormone gland problems--headache, sensitivity to light, unusual weakness or fatigue, dizziness, fast or irregular heartbeat, increased sensitivity to cold or heat, excessive sweating, constipation, hair loss, increased thirst or amount of urine, tremors or shaking, irritability Infusion reactions--chest   pain, shortness of breath or trouble breathing, feeling faint or lightheaded Kidney injury (glomerulonephritis)--decrease in the amount of urine, red or dark brown urine, foamy or bubbly urine, swelling of the ankles, hands, or feet Liver injury--right upper belly pain, loss of appetite, nausea, light-colored stool, dark yellow or brown urine, yellowing skin or eyes, unusual weakness or fatigue Pain, tingling, or numbness in the hands or feet, muscle weakness, change in vision, confusion or trouble  speaking, loss of balance or coordination, trouble walking, seizures Rash, fever, and swollen lymph nodes Redness, blistering, peeling, or loosening of the skin, including inside the mouth Sudden or severe stomach pain, bloody diarrhea, fever, nausea, vomiting Side effects that usually do not require medical attention (report to your care team if they continue or are bothersome): Bone, joint, or muscle pain Diarrhea Fatigue Loss of appetite Nausea Skin rash This list may not describe all possible side effects. Call your doctor for medical advice about side effects. You may report side effects to FDA at 1-800-FDA-1088. Where should I keep my medication? This medication is given in a hospital or clinic. It will not be stored at home. NOTE: This sheet is a summary. It may not cover all possible information. If you have questions about this medicine, talk to your doctor, pharmacist, or health care provider.  2023 Elsevier/Gold Standard (2012-11-15 00:00:00)    

## 2022-01-08 NOTE — Progress Notes (Signed)
Hematology/Oncology Consult note [facility]  [phone] [phone]  Patient Care Team: Idelle Crouch, MD as PCP - General (Internal Medicine) Sindy Guadeloupe, MD as Consulting Physician (Hematology and Oncology) Sindy Guadeloupe, MD as Consulting Physician (Hematology and Oncology) Sindy Guadeloupe, MD as Consulting Physician (Hematology and Oncology) Dasher, Rayvon Char, MD as Consulting Physician (Dermatology)   Name of the patient: Anthony Gonzales  [id number]  10-28-1931   Date of visit: 01/08/22  Diagnosis- Metastatic urothelial carcinoma with possible mets to the obturator node. Indeterminate hilar lymph nodes and LUL lung nodule    Chief complaint/ Reason for visit-on treatment assessment prior to cycle 1 of palliative Keytruda  Heme/Onc history: patient is a 88 year old male with past medical history significant for hypertension and long-standing history of superficial bladder cancer for which he sees [name].  He has undergone TURBT as well as ureteroscopy since 2017 along with mitomycin as well in the past.  Most recently he underwent CT abdomen on 08/06/2017 which showed a soft tissue mass in the right renal pelvis concerning for upper tract urothelial neoplasm.  Soft tissue fullness at the right ureterovesical junction with proximal right hydroureteronephrosis.  Several millimeter attenuation lesion in the pancreatic head.   He underwent diagnostic ureteroscopy and was found to have 2 high-grade lesions within his right kidney.  There was a nodular high-grade appearing lesion in the right anterior renal pelvis.  There was also a second ureteral tumor fungating from the right ureteral orifice extending into the distal ureter also consistent with high-grade invasive urothelial carcinoma.  Muscle invasion could not be assessed.  He also underwent a CT chest which showed a rounded nodule in the right upper lobe measuring 14 mm  concerning for metastases.  2 other 3 mm lesions were also noted in the left upper lobe likely benign   Plan initially was neoadjuvant chemotherapy followed by possible surgery but given the presence of lung lesion patient has been referred to oncology for the same.   PET/CT on 09/21/17 showed: IMPRESSION: 1. Hypermetabolic right obturator lymph node is most indicative of metastatic disease. 2. Mildly hypermetabolic left hilar lymph nodes are nonspecific. Continued attention on follow-up exams is warranted. 3. Right upper lobe pulmonary nodule shows metabolism after just above blood pool and is therefore indeterminate. Continued attention on follow-up exams is warranted. 4. Aortic atherosclerosis (ICD10-170.0). Coronary artery calcification. 5. Cholelithiasis    Carboplatin/ gemzar 1 week on and one-week off as patient could not tolerate 2-week on and one week off regimen.  Cycle 1 started on 09/22/2017 Disease progression in March 2020.  Switched to second [employer]   Patient had disease controlled with Keytruda.  However he was noted to have worsening dermatitis with constant flareups requiring steroids.  Beryle Flock is therefore being kept on hold and patient will be switched to third line Padcev.    Treatment has been on hold since February 2021 due to constant flareup of skin rash   Scans in October 2022 showed disease progression with new lung nodules as well as pelvic sidewall adenopathy.  Padcev restarted in November 2022.  Skin rash and blurry vision secondary to Padcev which was put on hold starting May 2023  Progressive disease based on scans in October 2023.  Plan is to rechallenge patient with Keytruda  Interval history-patient is doing well presently.  Skin rash over his bilateral forearms has subsided.  He does report gradually getting weaker and it takes him  longer to get up and go.  No recent falls.  ECOG PS- 1 Pain scale- 0   Review of systems- Review of Systems   Constitutional:  Positive for malaise/fatigue. Negative for chills, fever and weight loss.  HENT:  Negative for congestion, ear discharge and nosebleeds.   Eyes:  Negative for blurred vision.  Respiratory:  Negative for cough, hemoptysis, sputum production, shortness of breath and wheezing.   Cardiovascular:  Negative for chest pain, palpitations, orthopnea and claudication.  Gastrointestinal:  Negative for abdominal pain, blood in stool, constipation, diarrhea, heartburn, melena, nausea and vomiting.  Genitourinary:  Negative for dysuria, flank pain, frequency, hematuria and urgency.  Musculoskeletal:  Negative for back pain, joint pain and myalgias.  Skin:  Negative for rash.  Neurological:  Negative for dizziness, tingling, focal weakness, seizures, weakness and headaches.  Endo/Heme/Allergies:  Does not bruise/bleed easily.  Psychiatric/Behavioral:  Negative for depression and suicidal ideas. The patient does not have insomnia.       Allergies  Allergen Reactions   Demerol [Meperidine] Nausea And Vomiting   Lipitor [Atorvastatin] Swelling   Sulfa Antibiotics Nausea And Vomiting and Rash     Past Medical History:  Diagnosis Date   Arthritis    Benign fibroma of prostate 08/23/2013   BPH (benign prostatic hyperplasia)    Calculus of kidney 08/23/2013   Glaucoma    no drops in 3 mo pressure good, pt denies glaucoma, eye pressure has been measuring alright.   History of kidney stones    HLD (hyperlipidemia)    HOH (hard of hearing)    Left Hearing Aid   HTN (hypertension) 12/26/2014   Hypertension    Hyponatremia 12/26/2014   Migraines    history of migraines when he was younger.   Restless leg syndrome    Sinus drainage    Skin cancer    Skin cancer    left hand 06/2019   Urothelial cancer ([place])    chemo tx's.   UTI (lower urinary tract infection) 12/26/2014   Vertigo      Past Surgical History:  Procedure Laterality Date   COLONOSCOPY     CYSTOSCOPY W/  RETROGRADES Right 01/30/2015   Procedure: CYSTOSCOPY WITH RETROGRADE PYELOGRAM;  Surgeon: [name], MD;  Location: [facility];  Service: Urology;  Laterality: Right;   CYSTOSCOPY W/ RETROGRADES Bilateral 02/26/2016   Procedure: CYSTOSCOPY WITH RETROGRADE PYELOGRAM;  Surgeon: [name], MD;  Location: [facility];  Service: Urology;  Laterality: Bilateral;   CYSTOSCOPY W/ URETERAL STENT PLACEMENT Right 08/20/2015   Procedure: CYSTOSCOPY WITH RETROGRADE PYELOGRAM/POSSIBLE URETERAL STENT PLACEMENT/BLADDER BIOPSY;  Surgeon: [name], MD;  Location: [facility];  Service: Urology;  Laterality: Right;   CYSTOSCOPY W/ URETERAL STENT PLACEMENT Right 09/12/2015   Procedure: CYSTOSCOPY WITH STENT REPLACEMENT;  Surgeon: [name], MD;  Location: [facility];  Service: Urology;  Laterality: Right;   CYSTOSCOPY WITH BIOPSY Right 09/12/2015   Procedure: CYSTOSCOPY WITH BLADDER AND URETERAL BIOPSY;  Surgeon: [name], MD;  Location: [facility];  Service: Urology;  Laterality: Right;   CYSTOSCOPY WITH STENT PLACEMENT Right 01/30/2015   Procedure: CYSTOSCOPY WITH STENT PLACEMENT;  Surgeon: [name], MD;  Location: [facility];  Service: Urology;  Laterality: Right;   CYSTOSCOPY WITH STENT PLACEMENT Right 12/28/2017   Procedure: CYSTOSCOPY WITH STENT Exchange;  Surgeon: [name], MD;  Location: [facility];  Service: Urology;  Laterality: Right;   CYSTOSCOPY/URETEROSCOPY/HOLMIUM LASER/STENT PLACEMENT Right 08/19/2017   Procedure: CYSTOSCOPY/URETEROSCOPY/HOLMIUM LASER/STENT PLACEMENT;  Surgeon: [name], MD;  Location: [facility];  Service: Urology;  Laterality: Right;   EYE SURGERY Bilateral    Cataract Extraction with IOL   goiter removal     HOLMIUM LASER APPLICATION N/A 08/24/735   Procedure:  HOLMIUM LASER APPLICATION;  Surgeon: [name], MD;  Location: [facility];  Service: Urology;  Laterality: N/A;   PORTA CATH INSERTION N/A 09/23/2017   Procedure: PORTA CATH INSERTION;  Surgeon:  Algernon Huxley, MD;  Location: [facility];  Service: Cardiovascular;  Laterality: N/A;   SPERMATOCELECTOMY     TONSILLECTOMY     TRANSURETHRAL RESECTION OF BLADDER TUMOR N/A 02/26/2016   Procedure: TRANSURETHRAL RESECTION OF BLADDER TUMOR (TURBT);  Surgeon: [name], MD;  Location: [facility];  Service: Urology;  Laterality: N/A;   TRANSURETHRAL RESECTION OF BLADDER TUMOR WITH MITOMYCIN-C N/A 09/12/2015   Procedure: TRANSURETHRAL RESECTION OF BLADDER TUMOR ;  Surgeon: [name], MD;  Location: [facility];  Service: Urology;  Laterality: N/A;   TRANSURETHRAL RESECTION OF BLADDER TUMOR WITH MITOMYCIN-C N/A 03/24/2016   Procedure: TRANSURETHRAL RESECTION OF BLADDER TUMOR WITH MITOMYCIN-C  (SMALL);  Surgeon: [name], MD;  Location: [facility];  Service: Urology;  Laterality: N/A;   URETERAL BIOPSY Right 08/19/2017   Procedure: Renal Mass BIOPSY;  Surgeon: [name], MD;  Location: [facility];  Service: Urology;  Laterality: Right;   URETEROSCOPY Right 01/30/2015   Procedure: URETEROSCOPY/ WITH BIOPSY AND CYTOLOGY BRUSHING;  Surgeon: [name], MD;  Location: [facility];  Service: Urology;  Laterality: Right;   URETEROSCOPY Right 08/20/2015   Procedure: URETEROSCOPY;  Surgeon: [name], MD;  Location: [facility];  Service: Urology;  Laterality: Right;   URETEROSCOPY Right 09/12/2015   Procedure: URETEROSCOPY;  Surgeon: [name], MD;  Location: [facility];  Service: Urology;  Laterality: Right;   URETEROSCOPY Right 02/26/2016   Procedure: URETEROSCOPY;  Surgeon: [name], MD;  Location: [facility];  Service: Urology;  Laterality: Right;    Social History   Socioeconomic History   Marital status: Married    Spouse name: Not on file   Number of children: Not on file   Years of education: Not on file   Highest education level: Not on file  Occupational History   Not on file  Tobacco Use   Smoking status: Never   Smokeless tobacco: Never  Vaping Use   Vaping  Use: Never used  Substance and Sexual Activity   Alcohol use: No    Alcohol/week: 0.0 standard drinks of alcohol   Drug use: No   Sexual activity: Not Currently  Other Topics Concern   Not on file  Social History Narrative   Not on file   Social Determinants of Health   Financial Resource Strain: Not on file  Food Insecurity: Not on file  Transportation Needs: Not on file  Physical Activity: Not on file  Stress: Not on file  Social Connections: Not on file  Intimate Partner Violence: Not on file    Family History  Problem Relation Age of Onset   Hypertension Mother    Hypertension Father    Prostate cancer Brother    Kidney disease Neg Hx    Kidney cancer Neg Hx    Bladder Cancer Neg Hx      Current Outpatient Medications:    acetaminophen (TYLENOL) 500 MG tablet, Take 500 mg by mouth every 6 (six) hours as needed., Disp: , Rfl:    amLODipine (NORVASC) 5 MG tablet, , Disp: , Rfl:    aspirin  EC 325 MG tablet, Take 325 mg by mouth daily., Disp: , Rfl:    camphor-menthol (SARNA) lotion, Apply 1 application topically as needed for itching., Disp: , Rfl:    diazepam (VALIUM) 5 MG tablet, TAKE 1 TABLET (5 MG TOTAL) BY MOUTH EVERY 12 (TWELVE) HOURS AS NEEDED FOR ANXIETY OR SLEEP, Disp: , Rfl:    fenofibrate micronized (LOFIBRA) 134 MG capsule, Take 134 mg by mouth daily before breakfast. , Disp: , Rfl:    gemfibrozil (LOPID) 600 MG tablet, Take 600 mg by mouth daily., Disp: , Rfl:    hydrOXYzine (ATARAX) 10 MG tablet, Take 1 tablet (10 mg total) by mouth 3 (three) times daily as needed for itching., Disp: 120 tablet, Rfl: 0   methotrexate (RHEUMATREX) 2.5 MG tablet, Take by mouth., Disp: , Rfl:    metoprolol succinate (TOPROL-XL) 50 MG 24 hr tablet, Take 50 mg by mouth daily., Disp: , Rfl:    Multiple Vitamin (MULTIVITAMIN WITH MINERALS) TABS tablet, Take 1 tablet by mouth daily., Disp: , Rfl:    pantoprazole (PROTONIX) 20 MG tablet, Take 1 tablet (20 mg total) by mouth daily  for 14 days., Disp: 14 tablet, Rfl: 0   polyethylene glycol (MIRALAX / GLYCOLAX) 17 g packet, Take 17 g by mouth daily., Disp: , Rfl:    psyllium (METAMUCIL) 58.6 % packet, Take 1 packet by mouth daily., Disp: , Rfl:    senna (SENOKOT) 8.6 MG tablet, Take 2 tablets by mouth daily., Disp: , Rfl:    tamsulosin (FLOMAX) 0.4 MG CAPS capsule, Take 1 capsule (0.4 mg total) by mouth daily., Disp: 90 capsule, Rfl: 3   triamcinolone cream (KENALOG) 0.1 %, Apply 1 application. topically 2 (two) times daily., Disp: , Rfl:    folic acid (FOLVITE) 1 MG tablet, Take by mouth. (Patient not taking: Reported on 01/08/2022), Disp: , Rfl:    imipramine (TOFRANIL) 10 MG tablet, Take 10 mg by mouth at bedtime., Disp: , Rfl:    lidocaine-prilocaine (EMLA) cream, Apply to affected area once, Disp: 30 g, Rfl: 3   loperamide (IMODIUM A-D) 2 MG tablet, Take by mouth. (Patient not taking: Reported on 01/08/2022), Disp: , Rfl:    ondansetron (ZOFRAN) 8 MG tablet, Take 1 tablet (8 mg total) by mouth every 8 (eight) hours as needed for nausea or vomiting. (Patient not taking: Reported on 01/08/2022), Disp: 30 tablet, Rfl: 1   prochlorperazine (COMPAZINE) 10 MG tablet, Take 1 tablet (10 mg total) by mouth every 6 (six) hours as needed for nausea or vomiting. (Patient not taking: Reported on 07/01/2021), Disp: 30 tablet, Rfl: 0   prochlorperazine (COMPAZINE) 10 MG tablet, Take 1 tablet (10 mg total) by mouth every 6 (six) hours as needed for nausea or vomiting. (Patient not taking: Reported on 01/08/2022), Disp: 30 tablet, Rfl: 1 No current facility-administered medications for this visit.  Facility-Administered Medications Ordered in Other Visits:    heparin lock flush 100 UNIT/ML injection, , , ,    heparin lock flush 100 UNIT/ML injection, , , ,    heparin lock flush 100 unit/mL, 500 Units, Intravenous, Once, Sindy Guadeloupe, MD   sodium chloride flush (NS) 0.9 % injection 10 mL, 10 mL, Intravenous, Once, Sindy Guadeloupe, MD    sodium chloride flush (NS) 0.9 % injection 10 mL, 10 mL, Intravenous, PRN, Sindy Guadeloupe, MD   sodium chloride flush (NS) 0.9 % injection 10 mL, 10 mL, Intravenous, PRN, Sindy Guadeloupe, MD, 10 mL at 08/21/21 [phone]  Physical exam:  Vitals:   01/08/22 0921  BP: (!) 162/90  Pulse: 66  Resp: 18  SpO2: 95%  Weight: 189 lb 3.2 oz (85.8 kg)   Physical Exam Constitutional:      General: He is not in acute distress. Cardiovascular:     Rate and Rhythm: Normal rate and regular rhythm.     Heart sounds: Normal heart sounds.  Pulmonary:     Effort: Pulmonary effort is normal.     Breath sounds: Normal breath sounds.  Abdominal:     General: Bowel sounds are normal.     Palpations: Abdomen is soft.  Skin:    General: Skin is warm and dry.  Neurological:     Mental Status: He is alert and oriented to person, place, and time.         Latest Ref Rng & Units 01/08/2022    8:55 AM  CMP  Glucose 70 - 99 mg/dL 137   BUN 8 - 23 mg/dL 16   Creatinine 0.61 - 1.24 mg/dL 0.78   Sodium 135 - 145 mmol/L 137   Potassium 3.5 - 5.1 mmol/L 3.8   Chloride 98 - 111 mmol/L 105   CO2 22 - 32 mmol/L 26   Calcium 8.9 - 10.3 mg/dL 8.7   Total Protein 6.5 - 8.1 g/dL 6.6   Total Bilirubin 0.3 - 1.2 mg/dL 0.5   Alkaline Phos 38 - 126 U/L 70   AST 15 - 41 U/L 34   ALT 0 - 44 U/L 21       Latest Ref Rng & Units 01/08/2022    8:55 AM  CBC  WBC 4.0 - 10.5 K/uL 6.4   Hemoglobin 13.0 - 17.0 g/dL 14.5   Hematocrit 39.0 - 52.0 % 40.9   Platelets 150 - 400 K/uL 234     No images are attached to the encounter.  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 Restaging. * Tracking Code: BO * EXAM: CT CHEST, ABDOMEN, AND PELVIS WITH CONTRAST TECHNIQUE: Multidetector CT imaging of the chest, abdomen and pelvis was performed following the standard protocol during bolus administration of intravenous  contrast. RADIATION DOSE REDUCTION: This exam was performed according to the departmental dose-optimization program which includes automated exposure control, adjustment of the mA and/or kV according to patient size and/or use of iterative reconstruction technique. CONTRAST:  118mL OMNIPAQUE IOHEXOL 300 MG/ML  SOLN COMPARISON:  09/13/2021 CT chest, abdomen and pelvis. FINDINGS: CT CHEST FINDINGS Cardiovascular: Normal heart size. No significant pericardial effusion/thickening. Left anterior descending coronary atherosclerosis. Right internal jugular Port-A-Cath terminates in the middle third of the SVC. Atherosclerotic nonaneurysmal thoracic aorta. Normal caliber pulmonary arteries. No central pulmonary emboli. Mediastinum/Nodes: No significant thyroid nodules. Unremarkable esophagus. No pathologically enlarged axillary, mediastinal or hilar lymph nodes. Lungs/Pleura: No pneumothorax. No pleural effusion. Solid 1.3 x 1.0 cm medial right upper lobe pulmonary nodule, previously 1.3 x 1.0 cm using similar measurement technique, stable. Solid anterior left lower lobe 1.1 x 0.9 cm pulmonary nodule along the major fissure (series 3/image 97), previously 0.3 x 0.2 cm, significantly increased. No acute consolidative airspace disease or new significant pulmonary nodules. Musculoskeletal: No aggressive appearing focal osseous lesions. Mild thoracic spondylosis. Symmetric mild bilateral gynecomastia, stable. CT ABDOMEN PELVIS FINDINGS Hepatobiliary: Normal liver with no liver mass. No biliary ductal dilatation. Cholelithiasis. Pancreas: Small cystic pancreatic lesions in the pancreatic head, largest 1.0 cm (series 2/image 84),  stable. No new pancreatic lesions. No pancreatic duct dilation. Spleen: Normal size. No mass. Adrenals/Urinary Tract: Stable adrenals without discrete adrenal nodules. Simple 3.8 cm anterior lower right renal cyst and additional scattered subcentimeter hypodense left renal cortical lesions that are too  small to characterize, for which no follow-up imaging is recommended. No suspicious renal masses. No hydronephrosis. Normal caliber ureters. Stable small posterior left bladder wall 1.7 cm diverticulum. Otherwise normal bladder. Stomach/Bowel: Grossly normal nondistended stomach. Normal caliber small bowel with no small bowel wall thickening. Normal appendix. Moderate sigmoid diverticulosis with no large bowel wall thickening or significant pericolonic fat stranding. Vascular/Lymphatic: Atherosclerotic nonaneurysmal abdominal aorta. Patent portal, splenic, hepatic and renal veins. Right posterior pelvic sidewall 2.1 x 1.5 cm nodal mass (series 2/image 118), previously 1.5 x 1.3 cm, increased. Otherwise no pathologically enlarged lymph nodes in the abdomen or pelvis. Reproductive: Top-normal size prostate with nonspecific heterogeneous internal prostatic calcifications. Other: No pneumoperitoneum, ascites or focal fluid collection. Musculoskeletal: No aggressive appearing focal osseous lesions. Marked lumbar spondylosis. IMPRESSION: 1. Significant interval growth of a solid 1.1 cm anterior left lower lobe pulmonary nodule along the major fissure, previously 0.3 cm, compatible with pulmonary metastasis. Right upper lobe 1.3 cm pulmonary metastasis is stable. 2. Interval growth of right posterior pelvic sidewall 2.1 cm nodal mass, compatible with metastasis. 3. Chronic findings include: Coronary atherosclerosis. Stable small cystic pancreatic lesions in the pancreatic head, largest 1.0 cm, stable. No biliary or pancreatic duct dilation. Cholelithiasis. Moderate sigmoid diverticulosis. Aortic Atherosclerosis (ICD10-I70.0). Electronically Signed   By: Ilona Sorrel M.D.   On: 12/23/2021 16:57     Assessment and plan- Patient is a 88 y.o. male  with metastatic urothelial cancer with lung and lymph node metastases.  He is here for on treatment assessment prior to cycle 1 of palliative Keytruda  Patient was given  Beryle Flock in the past between March 2020 10 December 2018.  He responded to this regimen but did develop a significant skin rash because of which we had to stop it.  I am therefore planning to rechallenge him with Keytruda at this time.  Counts otherwise okay to proceed with Keytruda today.  I will see him back in 3 weeks for cycle 2.  He will be following along with Dr. Evorn Gong closely as well and may require weekly methotrexate at a low dose should he develop a significant skin rash to [facility]   Visit Diagnosis 1. 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[place])   2. Encounter for antineoplastic immunotherapy      Dr. Randa Evens, MD, MPH [facility] [phone] 01/08/2022 4:14 PM

## 2022-01-08 NOTE — Patient Instructions (Signed)

## 2022-01-08 NOTE — Progress Notes (Signed)
Pt states he feels weaker and does not understand why; it started when his BP medication was changed about three weeks ago. Pt also stated it is more difficult for him to get up from a chair; still walking about 20 minues a day.

## 2022-01-09 LAB — T4: T4, Total: 6.1 ug/dL (ref 4.5–12.0)

## 2022-01-13 ENCOUNTER — Telehealth: Payer: Self-pay | Admitting: Oncology

## 2022-01-13 NOTE — Telephone Encounter (Signed)
Patient has appointment on 01/29/22 with Dr. Janese Banks and Dr. Doy Hutching- He would like to change the appointment with Dr. Janese Banks. Please advise on scheduling-  Patient states he can get appointments from [place]

## 2022-01-16 ENCOUNTER — Telehealth: Payer: Self-pay

## 2022-01-16 NOTE — Telephone Encounter (Signed)
Reached out to pt in order to coordinate his request of moving his appt out due to having an appt with Dr. Doy Hutching on the same day. Pt did not answer his home number and it is the only phone number on file. Left a VM stating to please return our call and let us know what his preference will be.

## 2022-01-27 DIAGNOSIS — I10 Essential (primary) hypertension: Secondary | ICD-10-CM | POA: Diagnosis not present

## 2022-01-29 ENCOUNTER — Inpatient Hospital Stay: Payer: PPO

## 2022-01-29 ENCOUNTER — Inpatient Hospital Stay (HOSPITAL_BASED_OUTPATIENT_CLINIC_OR_DEPARTMENT_OTHER): Payer: PPO | Admitting: Oncology

## 2022-01-29 ENCOUNTER — Encounter: Payer: Self-pay | Admitting: Oncology

## 2022-01-29 VITALS — BP 152/79 | HR 59 | Resp 18 | Wt 189.4 lb

## 2022-01-29 DIAGNOSIS — Z5112 Encounter for antineoplastic immunotherapy: Secondary | ICD-10-CM | POA: Diagnosis not present

## 2022-01-29 DIAGNOSIS — C669 Malignant neoplasm of unspecified ureter: Secondary | ICD-10-CM

## 2022-01-29 LAB — COMPREHENSIVE METABOLIC PANEL
ALT: 19 U/L (ref 0–44)
AST: 28 U/L (ref 15–41)
Albumin: 3.5 g/dL (ref 3.5–5.0)
Alkaline Phosphatase: 65 U/L (ref 38–126)
Anion gap: 6 (ref 5–15)
BUN: 21 mg/dL (ref 8–23)
CO2: 25 mmol/L (ref 22–32)
Calcium: 8.6 mg/dL — ABNORMAL LOW (ref 8.9–10.3)
Chloride: 105 mmol/L (ref 98–111)
Creatinine, Ser: 0.88 mg/dL (ref 0.61–1.24)
GFR, Estimated: >60 mL/min (ref >60)
Glucose, Bld: 112 mg/dL — ABNORMAL HIGH (ref 70–99)
Potassium: 4 mmol/L (ref 3.5–5.1)
Sodium: 136 mmol/L (ref 135–145)
Total Bilirubin: 0.6 mg/dL (ref 0.3–1.2)
Total Protein: 6.4 g/dL — ABNORMAL LOW (ref 6.5–8.1)

## 2022-01-29 LAB — CBC WITH DIFFERENTIAL/PLATELET
Abs Immature Granulocytes: 0.05 10*3/uL (ref 0.00–0.07)
Basophils Absolute: 0.1 10*3/uL (ref 0.0–0.1)
Basophils Relative: 1 %
Eosinophils Absolute: 0.2 10*3/uL (ref 0.0–0.5)
Eosinophils Relative: 3 %
HCT: 42.1 % (ref 39.0–52.0)
Hemoglobin: 14.4 g/dL (ref 13.0–17.0)
Immature Granulocytes: 1 %
Lymphocytes Relative: 26 %
Lymphs Abs: 1.8 10*3/uL (ref 0.7–4.0)
MCH: 31.2 pg (ref 26.0–34.0)
MCHC: 34.2 g/dL (ref 30.0–36.0)
MCV: 91.1 fL (ref 80.0–100.0)
Monocytes Absolute: 0.6 10*3/uL (ref 0.1–1.0)
Monocytes Relative: 9 %
Neutro Abs: 4.2 10*3/uL (ref 1.7–7.7)
Neutrophils Relative %: 60 %
Platelets: 253 10*3/uL (ref 150–400)
RBC: 4.62 MIL/uL (ref 4.22–5.81)
RDW: 12.6 % (ref 11.5–15.5)
WBC: 6.9 10*3/uL (ref 4.0–10.5)
nRBC: 0 % (ref 0.0–0.2)

## 2022-01-29 MED ORDER — SODIUM CHLORIDE 0.9 % IV SOLN
Freq: Once | INTRAVENOUS | Status: AC
  Filled 2022-01-29: qty 250

## 2022-01-29 MED ORDER — HEPARIN SOD (PORK) LOCK FLUSH 100 UNIT/ML IV SOLN
500.0000 [IU] | Freq: Once | INTRAVENOUS | Status: AC | PRN
  Administered 2022-01-29: 500 [IU]
  Filled 2022-01-29: qty 5

## 2022-01-29 MED ORDER — SODIUM CHLORIDE 0.9 % IV SOLN
200.0000 mg | Freq: Once | INTRAVENOUS | Status: AC
  Administered 2022-01-29: 200 mg via INTRAVENOUS
  Filled 2022-01-29: qty 8

## 2022-01-29 MED ORDER — SODIUM CHLORIDE 0.9% FLUSH
10.0000 mL | Freq: Once | INTRAVENOUS | Status: AC
  Administered 2022-01-29: 10 mL via INTRAVENOUS
  Filled 2022-01-29: qty 10

## 2022-01-29 NOTE — Progress Notes (Signed)
Pt states when he sits for a long time it takes him a while to stand up and is causing balance issues very off balance this morning.

## 2022-01-31 ENCOUNTER — Encounter: Payer: Self-pay | Admitting: Oncology

## 2022-01-31 NOTE — Progress Notes (Signed)
Hematology/Oncology Consult note [facility]  [phone] [phone]  Patient Care Team: Idelle Crouch, MD as PCP - General (Internal Medicine) Sindy Guadeloupe, MD as Consulting Physician (Hematology and Oncology) Sindy Guadeloupe, MD as Consulting Physician (Hematology and Oncology) Sindy Guadeloupe, MD as Consulting Physician (Hematology and Oncology) Dasher, Rayvon Char, MD as Consulting Physician (Dermatology)   Name of the patient: Anthony Gonzales  [id number]  December 07, 1931   Date of visit: 01/31/22  Diagnosis- Metastatic urothelial carcinoma with possible mets to the obturator node. Indeterminate hilar lymph nodes and LUL lung nodule    Chief complaint/ Reason for visit-on treatment assessment prior to cycle 2 of palliative Keytruda  Heme/Onc history: patient is a 88 year old male with past medical history significant for hypertension and long-standing history of superficial bladder cancer for which he sees [name].  He has undergone TURBT as well as ureteroscopy since 2017 along with mitomycin as well in the past.  Most recently he underwent CT abdomen on 08/06/2017 which showed a soft tissue mass in the right renal pelvis concerning for upper tract urothelial neoplasm.  Soft tissue fullness at the right ureterovesical junction with proximal right hydroureteronephrosis.  Several millimeter attenuation lesion in the pancreatic head.   He underwent diagnostic ureteroscopy and was found to have 2 high-grade lesions within his right kidney.  There was a nodular high-grade appearing lesion in the right anterior renal pelvis.  There was also a second ureteral tumor fungating from the right ureteral orifice extending into the distal ureter also consistent with high-grade invasive urothelial carcinoma.  Muscle invasion could not be assessed.  He also underwent a CT chest which showed a rounded nodule in the right upper lobe measuring 14 mm  concerning for metastases.  2 other 3 mm lesions were also noted in the left upper lobe likely benign   Plan initially was neoadjuvant chemotherapy followed by possible surgery but given the presence of lung lesion patient has been referred to oncology for the same.   PET/CT on 09/21/17 showed: IMPRESSION: 1. Hypermetabolic right obturator lymph node is most indicative of metastatic disease. 2. Mildly hypermetabolic left hilar lymph nodes are nonspecific. Continued attention on follow-up exams is warranted. 3. Right upper lobe pulmonary nodule shows metabolism after just above blood pool and is therefore indeterminate. Continued attention on follow-up exams is warranted. 4. Aortic atherosclerosis (ICD10-170.0). Coronary artery calcification. 5. Cholelithiasis    Carboplatin/ gemzar 1 week on and one-week off as patient could not tolerate 2-week on and one week off regimen.  Cycle 1 started on 09/22/2017 Disease progression in March 2020.  Switched to second [employer]   Patient had disease controlled with Keytruda.  However he was noted to have worsening dermatitis with constant flareups requiring steroids.  Beryle Flock is therefore being kept on hold and patient will be switched to third line Padcev.    Treatment has been on hold since February 2021 due to constant flareup of skin rash   Scans in October 2022 showed disease progression with new lung nodules as well as pelvic sidewall adenopathy.  Padcev restarted in November 2022.  Skin rash and blurry vision secondary to Padcev which was put on hold starting May 2023   Progressive disease based on scans in October 2023.  Plan is to rechallenge patient with Keytruda  Interval history-patient does have some degree of rash over his left upper extremity which has not been bothering him.  No significant pruritus associated with  it.  He is currently using 1% triamcinolone cream which has been helping him.  He does not have rash in any other  areas.  ECOG PS- 1 Pain scale- 0   Review of systems- Review of Systems  Constitutional:  Negative for chills, fever, malaise/fatigue and weight loss.  HENT:  Negative for congestion, ear discharge and nosebleeds.   Eyes:  Negative for blurred vision.  Respiratory:  Negative for cough, hemoptysis, sputum production, shortness of breath and wheezing.   Cardiovascular:  Negative for chest pain, palpitations, orthopnea and claudication.  Gastrointestinal:  Negative for abdominal pain, blood in stool, constipation, diarrhea, heartburn, melena, nausea and vomiting.  Genitourinary:  Negative for dysuria, flank pain, frequency, hematuria and urgency.  Musculoskeletal:  Negative for back pain, joint pain and myalgias.  Skin:  Positive for rash.  Neurological:  Negative for dizziness, tingling, focal weakness, seizures, weakness and headaches.  Endo/Heme/Allergies:  Does not bruise/bleed easily.  Psychiatric/Behavioral:  Negative for depression and suicidal ideas. The patient does not have insomnia.       Allergies  Allergen Reactions   Demerol [Meperidine] Nausea And Vomiting   Lipitor [Atorvastatin] Swelling   Sulfa Antibiotics Nausea And Vomiting and Rash     Past Medical History:  Diagnosis Date   Arthritis    Benign fibroma of prostate 08/23/2013   BPH (benign prostatic hyperplasia)    Calculus of kidney 08/23/2013   Glaucoma    no drops in 3 mo pressure good, pt denies glaucoma, eye pressure has been measuring alright.   History of kidney stones    HLD (hyperlipidemia)    HOH (hard of hearing)    Left Hearing Aid   HTN (hypertension) 12/26/2014   Hypertension    Hyponatremia 12/26/2014   Migraines    history of migraines when he was younger.   Restless leg syndrome    Sinus drainage    Skin cancer    Skin cancer    left hand 06/2019   Urothelial cancer ([place])    chemo tx's.   UTI (lower urinary tract infection) 12/26/2014   Vertigo      Past Surgical History:   Procedure Laterality Date   COLONOSCOPY     CYSTOSCOPY W/ RETROGRADES Right 01/30/2015   Procedure: CYSTOSCOPY WITH RETROGRADE PYELOGRAM;  Surgeon: [name], MD;  Location: [facility];  Service: Urology;  Laterality: Right;   CYSTOSCOPY W/ RETROGRADES Bilateral 02/26/2016   Procedure: CYSTOSCOPY WITH RETROGRADE PYELOGRAM;  Surgeon: [name], MD;  Location: [facility];  Service: Urology;  Laterality: Bilateral;   CYSTOSCOPY W/ URETERAL STENT PLACEMENT Right 08/20/2015   Procedure: CYSTOSCOPY WITH RETROGRADE PYELOGRAM/POSSIBLE URETERAL STENT PLACEMENT/BLADDER BIOPSY;  Surgeon: [name], MD;  Location: [facility];  Service: Urology;  Laterality: Right;   CYSTOSCOPY W/ URETERAL STENT PLACEMENT Right 09/12/2015   Procedure: CYSTOSCOPY WITH STENT REPLACEMENT;  Surgeon: [name], MD;  Location: [facility];  Service: Urology;  Laterality: Right;   CYSTOSCOPY WITH BIOPSY Right 09/12/2015   Procedure: CYSTOSCOPY WITH BLADDER AND URETERAL BIOPSY;  Surgeon: [name], MD;  Location: [facility];  Service: Urology;  Laterality: Right;   CYSTOSCOPY WITH STENT PLACEMENT Right 01/30/2015   Procedure: CYSTOSCOPY WITH STENT PLACEMENT;  Surgeon: [name], MD;  Location: [facility];  Service: Urology;  Laterality: Right;   CYSTOSCOPY WITH STENT PLACEMENT Right 12/28/2017   Procedure: CYSTOSCOPY WITH STENT Exchange;  Surgeon: [name], MD;  Location: [facility];  Service: Urology;  Laterality: Right;   CYSTOSCOPY/URETEROSCOPY/HOLMIUM LASER/STENT PLACEMENT Right 08/19/2017  Procedure: CYSTOSCOPY/URETEROSCOPY/HOLMIUM LASER/STENT PLACEMENT;  Surgeon: [name], MD;  Location: [facility];  Service: Urology;  Laterality: Right;   EYE SURGERY Bilateral    Cataract Extraction with IOL   goiter removal     HOLMIUM LASER APPLICATION N/A 7/00/1749   Procedure:  HOLMIUM LASER APPLICATION;  Surgeon: [name], MD;  Location: [facility];  Service: Urology;  Laterality: N/A;   PORTA CATH INSERTION  N/A 09/23/2017   Procedure: PORTA CATH INSERTION;  Surgeon: Algernon Huxley, MD;  Location: [facility];  Service: Cardiovascular;  Laterality: N/A;   SPERMATOCELECTOMY     TONSILLECTOMY     TRANSURETHRAL RESECTION OF BLADDER TUMOR N/A 02/26/2016   Procedure: TRANSURETHRAL RESECTION OF BLADDER TUMOR (TURBT);  Surgeon: [name], MD;  Location: [facility];  Service: Urology;  Laterality: N/A;   TRANSURETHRAL RESECTION OF BLADDER TUMOR WITH MITOMYCIN-C N/A 09/12/2015   Procedure: TRANSURETHRAL RESECTION OF BLADDER TUMOR ;  Surgeon: [name], MD;  Location: [facility];  Service: Urology;  Laterality: N/A;   TRANSURETHRAL RESECTION OF BLADDER TUMOR WITH MITOMYCIN-C N/A 03/24/2016   Procedure: TRANSURETHRAL RESECTION OF BLADDER TUMOR WITH MITOMYCIN-C  (SMALL);  Surgeon: [name], MD;  Location: [facility];  Service: Urology;  Laterality: N/A;   URETERAL BIOPSY Right 08/19/2017   Procedure: Renal Mass BIOPSY;  Surgeon: [name], MD;  Location: [facility];  Service: Urology;  Laterality: Right;   URETEROSCOPY Right 01/30/2015   Procedure: URETEROSCOPY/ WITH BIOPSY AND CYTOLOGY BRUSHING;  Surgeon: [name], MD;  Location: [facility];  Service: Urology;  Laterality: Right;   URETEROSCOPY Right 08/20/2015   Procedure: URETEROSCOPY;  Surgeon: [name], MD;  Location: [facility];  Service: Urology;  Laterality: Right;   URETEROSCOPY Right 09/12/2015   Procedure: URETEROSCOPY;  Surgeon: [name], MD;  Location: [facility];  Service: Urology;  Laterality: Right;   URETEROSCOPY Right 02/26/2016   Procedure: URETEROSCOPY;  Surgeon: [name], MD;  Location: [facility];  Service: Urology;  Laterality: Right;    Social History   Socioeconomic History   Marital status: Married    Spouse name: Not on file   Number of children: Not on file   Years of education: Not on file   Highest education level: Not on file  Occupational History   Not on file  Tobacco Use   Smoking  status: Never   Smokeless tobacco: Never  Vaping Use   Vaping Use: Never used  Substance and Sexual Activity   Alcohol use: No    Alcohol/week: 0.0 standard drinks of alcohol   Drug use: No   Sexual activity: Not Currently  Other Topics Concern   Not on file  Social History Narrative   Not on file   Social Determinants of Health   Financial Resource Strain: Not on file  Food Insecurity: Not on file  Transportation Needs: Not on file  Physical Activity: Not on file  Stress: Not on file  Social Connections: Not on file  Intimate Partner Violence: Not on file    Family History  Problem Relation Age of Onset   Hypertension Mother    Hypertension Father    Prostate cancer Brother    Kidney disease Neg Hx    Kidney cancer Neg Hx    Bladder Cancer Neg Hx      Current Outpatient Medications:    acetaminophen (TYLENOL) 500 MG tablet, Take 500 mg by mouth every 6 (six) hours as needed., Disp: , Rfl:    amLODipine (NORVASC) 5  MG tablet, , Disp: , Rfl:    camphor-menthol (SARNA) lotion, Apply 1 application topically as needed for itching., Disp: , Rfl:    diazepam (VALIUM) 5 MG tablet, TAKE 1 TABLET (5 MG TOTAL) BY MOUTH EVERY 12 (TWELVE) HOURS AS NEEDED FOR ANXIETY OR SLEEP, Disp: , Rfl:    docusate sodium (COLACE) 100 MG capsule, Take 100 mg by mouth 2 (two) times daily., Disp: , Rfl:    fenofibrate micronized (LOFIBRA) 134 MG capsule, Take 134 mg by mouth daily before breakfast. , Disp: , Rfl:    gemfibrozil (LOPID) 600 MG tablet, Take 600 mg by mouth daily., Disp: , Rfl:    hydrOXYzine (ATARAX) 10 MG tablet, Take 1 tablet (10 mg total) by mouth 3 (three) times daily as needed for itching., Disp: 120 tablet, Rfl: 0   imipramine (TOFRANIL) 10 MG tablet, Take 10 mg by mouth at bedtime., Disp: , Rfl:    lidocaine-prilocaine (EMLA) cream, Apply to affected area once, Disp: 30 g, Rfl: 3   metoprolol succinate (TOPROL-XL) 50 MG 24 hr tablet, Take 50 mg by mouth daily., Disp: , Rfl:     Multiple Vitamin (MULTIVITAMIN WITH MINERALS) TABS tablet, Take 1 tablet by mouth daily., Disp: , Rfl:    ondansetron (ZOFRAN) 8 MG tablet, Take 1 tablet (8 mg total) by mouth every 8 (eight) hours as needed for nausea or vomiting., Disp: 30 tablet, Rfl: 1   polyethylene glycol (MIRALAX / GLYCOLAX) 17 g packet, Take 17 g by mouth daily., Disp: , Rfl:    psyllium (METAMUCIL) 58.6 % packet, Take 1 packet by mouth daily., Disp: , Rfl:    senna (SENOKOT) 8.6 MG tablet, Take 2 tablets by mouth daily., Disp: , Rfl:    tamsulosin (FLOMAX) 0.4 MG CAPS capsule, Take 1 capsule (0.4 mg total) by mouth daily., Disp: 90 capsule, Rfl: 3   triamcinolone cream (KENALOG) 0.1 %, Apply 1 application. topically 2 (two) times daily., Disp: , Rfl:    aspirin EC 325 MG tablet, Take 325 mg by mouth daily. (Patient not taking: Reported on 01/29/2022), Disp: , Rfl:    folic acid (FOLVITE) 1 MG tablet, Take by mouth. (Patient not taking: Reported on 01/08/2022), Disp: , Rfl:    loperamide (IMODIUM A-D) 2 MG tablet, Take by mouth. (Patient not taking: Reported on 01/08/2022), Disp: , Rfl:    methotrexate (RHEUMATREX) 2.5 MG tablet, Take by mouth. (Patient not taking: Reported on 01/29/2022), Disp: , Rfl:    pantoprazole (PROTONIX) 20 MG tablet, Take 1 tablet (20 mg total) by mouth daily for 14 days., Disp: 14 tablet, Rfl: 0   prochlorperazine (COMPAZINE) 10 MG tablet, Take 1 tablet (10 mg total) by mouth every 6 (six) hours as needed for nausea or vomiting. (Patient not taking: Reported on 07/01/2021), Disp: 30 tablet, Rfl: 0   prochlorperazine (COMPAZINE) 10 MG tablet, Take 1 tablet (10 mg total) by mouth every 6 (six) hours as needed for nausea or vomiting. (Patient not taking: Reported on 01/08/2022), Disp: 30 tablet, Rfl: 1 No current facility-administered medications for this visit.  Facility-Administered Medications Ordered in Other Visits:    heparin lock flush 100 UNIT/ML injection, , , ,    heparin lock flush 100  UNIT/ML injection, , , ,    heparin lock flush 100 unit/mL, 500 Units, Intravenous, Once, Sindy Guadeloupe, MD   sodium chloride flush (NS) 0.9 % injection 10 mL, 10 mL, Intravenous, Once, Sindy Guadeloupe, MD   sodium chloride flush (NS) 0.9 %  injection 10 mL, 10 mL, Intravenous, PRN, Sindy Guadeloupe, MD   sodium chloride flush (NS) 0.9 % injection 10 mL, 10 mL, Intravenous, PRN, Sindy Guadeloupe, MD, 10 mL at 08/21/21 0841  Physical exam:  Vitals:   01/29/22 1016  BP: (!) 152/79  Pulse: (!) 59  Resp: 18  SpO2: 94%  Weight: 189 lb 6.4 oz (85.9 kg)   Physical Exam Constitutional:      General: He is not in acute distress. Cardiovascular:     Rate and Rhythm: Normal rate and regular rhythm.     Heart sounds: Normal heart sounds.  Pulmonary:     Effort: Pulmonary effort is normal.     Breath sounds: Normal breath sounds.  Abdominal:     General: Bowel sounds are normal.     Palpations: Abdomen is soft.  Skin:    Comments: Maculopapular erythematous rash noted over left upper extremity.  Rash is not confluent and there are areas of normal skin noted in between.  No rash seen in other areas.  Neurological:     Mental Status: He is alert and oriented to person, place, and time.         Latest Ref Rng & Units 01/29/2022    9:35 AM  CMP  Glucose 70 - 99 mg/dL 112   BUN 8 - 23 mg/dL 21   Creatinine 0.61 - 1.24 mg/dL 0.88   Sodium 135 - 145 mmol/L 136   Potassium 3.5 - 5.1 mmol/L 4.0   Chloride 98 - 111 mmol/L 105   CO2 22 - 32 mmol/L 25   Calcium 8.9 - 10.3 mg/dL 8.6   Total Protein 6.5 - 8.1 g/dL 6.4   Total Bilirubin 0.3 - 1.2 mg/dL 0.6   Alkaline Phos 38 - 126 U/L 65   AST 15 - 41 U/L 28   ALT 0 - 44 U/L 19       Latest Ref Rng & Units 01/29/2022    9:35 AM  CBC  WBC 4.0 - 10.5 K/uL 6.9   Hemoglobin 13.0 - 17.0 g/dL 14.4   Hematocrit 39.0 - 52.0 % 42.1   Platelets 150 - 400 K/uL 253     Assessment and plan- Patient is a 88 y.o. male  with metastatic urothelial cancer  with lung and lymph node metastases.  He is here for on treatment assessment prior to cycle 2 of palliative Keytruda  Counts okay to proceed with cycle 2 of palliative Keytruda today.  I will see him back in 3 weeks for cycle 3.  Plan to repeat scans after 4 cycles.  He has developed a mild rash over his left upper extremity.  Overall the rash is not confluent and not seen in other areas.  He has topical triamcinolone cream which he will use as needed.  If the rash worsens I will get in touch with Dr. Evorn Gong to see if he can be started on a low-dose methotrexate as before.  There is a concern for efficacy of Keytruda and the use of immunosuppressants.  However if he continues to respond on his scans and if his rash can be controlled I would like to continue with Keytruda until progression or toxicity.   Visit Diagnosis 1. 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 Encounter for antineoplastic immunotherapy      Dr. Randa Evens, MD, MPH [facility] [phone] 01/31/2022 9:40 AM

## 2022-02-10 DIAGNOSIS — Z515 Encounter for palliative care: Secondary | ICD-10-CM | POA: Diagnosis not present

## 2022-02-10 DIAGNOSIS — I10 Essential (primary) hypertension: Secondary | ICD-10-CM | POA: Diagnosis not present

## 2022-02-19 ENCOUNTER — Inpatient Hospital Stay: Payer: PPO

## 2022-02-19 ENCOUNTER — Encounter: Payer: Self-pay | Admitting: Oncology

## 2022-02-19 ENCOUNTER — Inpatient Hospital Stay: Payer: PPO | Attending: Oncology | Admitting: Oncology

## 2022-02-19 VITALS — BP 161/84 | HR 64 | Temp 99.2°F | Ht 72.0 in | Wt 192.0 lb

## 2022-02-19 DIAGNOSIS — Z5112 Encounter for antineoplastic immunotherapy: Secondary | ICD-10-CM | POA: Insufficient documentation

## 2022-02-19 DIAGNOSIS — C7800 Secondary malignant neoplasm of unspecified lung: Secondary | ICD-10-CM | POA: Diagnosis not present

## 2022-02-19 DIAGNOSIS — C661 Malignant neoplasm of right ureter: Secondary | ICD-10-CM | POA: Diagnosis not present

## 2022-02-19 DIAGNOSIS — I10 Essential (primary) hypertension: Secondary | ICD-10-CM | POA: Diagnosis not present

## 2022-02-19 DIAGNOSIS — C669 Malignant neoplasm of unspecified ureter: Secondary | ICD-10-CM

## 2022-02-19 DIAGNOSIS — Z7982 Long term (current) use of aspirin: Secondary | ICD-10-CM | POA: Diagnosis not present

## 2022-02-19 DIAGNOSIS — C771 Secondary and unspecified malignant neoplasm of intrathoracic lymph nodes: Secondary | ICD-10-CM | POA: Insufficient documentation

## 2022-02-19 DIAGNOSIS — Z79899 Other long term (current) drug therapy: Secondary | ICD-10-CM | POA: Diagnosis not present

## 2022-02-19 LAB — COMPREHENSIVE METABOLIC PANEL
ALT: 19 U/L (ref 0–44)
AST: 32 U/L (ref 15–41)
Albumin: 3.5 g/dL (ref 3.5–5.0)
Alkaline Phosphatase: 72 U/L (ref 38–126)
Anion gap: 7 (ref 5–15)
BUN: 18 mg/dL (ref 8–23)
CO2: 26 mmol/L (ref 22–32)
Calcium: 8.7 mg/dL — ABNORMAL LOW (ref 8.9–10.3)
Chloride: 104 mmol/L (ref 98–111)
Creatinine, Ser: 0.86 mg/dL (ref 0.61–1.24)
GFR, Estimated: >60 mL/min (ref >60)
Glucose, Bld: 141 mg/dL — ABNORMAL HIGH (ref 70–99)
Potassium: 4.1 mmol/L (ref 3.5–5.1)
Sodium: 137 mmol/L (ref 135–145)
Total Bilirubin: 0.6 mg/dL (ref 0.3–1.2)
Total Protein: 6.6 g/dL (ref 6.5–8.1)

## 2022-02-19 LAB — CBC WITH DIFFERENTIAL/PLATELET
Abs Immature Granulocytes: 0.04 10*3/uL (ref 0.00–0.07)
Basophils Absolute: 0.1 10*3/uL (ref 0.0–0.1)
Basophils Relative: 1 %
Eosinophils Absolute: 0.2 10*3/uL (ref 0.0–0.5)
Eosinophils Relative: 3 %
HCT: 43 % (ref 39.0–52.0)
Hemoglobin: 14.7 g/dL (ref 13.0–17.0)
Immature Granulocytes: 1 %
Lymphocytes Relative: 22 %
Lymphs Abs: 1.7 10*3/uL (ref 0.7–4.0)
MCH: 30.9 pg (ref 26.0–34.0)
MCHC: 34.2 g/dL (ref 30.0–36.0)
MCV: 90.3 fL (ref 80.0–100.0)
Monocytes Absolute: 0.6 10*3/uL (ref 0.1–1.0)
Monocytes Relative: 8 %
Neutro Abs: 4.9 10*3/uL (ref 1.7–7.7)
Neutrophils Relative %: 65 %
Platelets: 248 10*3/uL (ref 150–400)
RBC: 4.76 MIL/uL (ref 4.22–5.81)
RDW: 11.9 % (ref 11.5–15.5)
WBC: 7.5 10*3/uL (ref 4.0–10.5)
nRBC: 0 % (ref 0.0–0.2)

## 2022-02-19 LAB — TSH: TSH: 1.492 u[IU]/mL (ref 0.350–4.500)

## 2022-02-19 MED ORDER — SODIUM CHLORIDE 0.9 % IV SOLN
Freq: Once | INTRAVENOUS | Status: AC
  Filled 2022-02-19: qty 250

## 2022-02-19 MED ORDER — HEPARIN SOD (PORK) LOCK FLUSH 100 UNIT/ML IV SOLN
INTRAVENOUS | Status: AC
  Filled 2022-02-19: qty 5

## 2022-02-19 MED ORDER — SODIUM CHLORIDE 0.9 % IV SOLN
200.0000 mg | Freq: Once | INTRAVENOUS | Status: AC
  Administered 2022-02-19: 200 mg via INTRAVENOUS
  Filled 2022-02-19: qty 8

## 2022-02-19 MED ORDER — HEPARIN SOD (PORK) LOCK FLUSH 100 UNIT/ML IV SOLN
500.0000 [IU] | Freq: Once | INTRAVENOUS | Status: AC | PRN
  Administered 2022-02-19: 500 [IU]
  Filled 2022-02-19: qty 5

## 2022-02-19 NOTE — Patient Instructions (Signed)
[facility]  Discharge Instructions: Thank you for choosing [facility] to provide your oncology and hematology care.  If you have a lab appointment with the Cancer Center, please go directly to the Cancer Center and check in at the registration area.  Wear comfortable clothing and clothing appropriate for easy access to any Portacath or PICC line.   We strive to give you quality time with your provider. You may need to reschedule your appointment if you arrive late (15 or more minutes).  Arriving late affects you and other patients whose appointments are after yours.  Also, if you miss three or more appointments without notifying the office, you may be dismissed from the clinic at the provider's discretion.      For prescription refill requests, have your pharmacy contact our office and allow 72 hours for refills to be completed.    Today you received the following chemotherapy and/or immunotherapy agents Keytruda      To help prevent nausea and vomiting after your treatment, we encourage you to take your nausea medication as directed.  BELOW ARE SYMPTOMS THAT SHOULD BE REPORTED IMMEDIATELY: *FEVER GREATER THAN 100.4 F (38 C) OR HIGHER *CHILLS OR SWEATING *NAUSEA AND VOMITING THAT IS NOT CONTROLLED WITH YOUR NAUSEA MEDICATION *UNUSUAL SHORTNESS OF BREATH *UNUSUAL BRUISING OR BLEEDING *URINARY PROBLEMS (pain or burning when urinating, or frequent urination) *BOWEL PROBLEMS (unusual diarrhea, constipation, pain near the anus) TENDERNESS IN MOUTH AND THROAT WITH OR WITHOUT PRESENCE OF ULCERS (sore throat, sores in mouth, or a toothache) UNUSUAL RASH, SWELLING OR PAIN  UNUSUAL VAGINAL DISCHARGE OR ITCHING   Items with * indicate a potential emergency and should be followed up as soon as possible or go to the Emergency Department if any problems should occur.  Please show the CHEMOTHERAPY ALERT CARD or IMMUNOTHERAPY ALERT CARD at check-in to  the Emergency Department and triage nurse.  Should you have questions after your visit or need to cancel or reschedule your appointment, please contact [facility]  [phone] and follow the prompts.  Office hours are 8:00 a.m. to 4:30 p.m. Monday - Friday. Please note that voicemails left after 4:00 p.m. may not be returned until the following business day.  We are closed weekends and major holidays. You have access to a nurse at all times for urgent questions. Please call the main number to the clinic [phone] and follow the prompts.  For any non-urgent questions, you may also contact your provider using MyChart. We now offer e-Visits for anyone 18 and older to request care online for non-urgent symptoms. For details visit [email or web address].   Also download the MyChart app! Go to the app store, search "MyChart", open the app, select [facility], and log in with your MyChart username and password.  Masks are optional in the cancer centers. If you would like for your care team to wear a mask while they are taking care of you, please let them know. For doctor visits, patients may have with them one support person who is at least 86 years old. At this time, visitors are not allowed in the infusion area.   

## 2022-02-19 NOTE — Progress Notes (Signed)
Hematology/Oncology Consult note [facility]  [phone] [phone]  Patient Care Team: Idelle Crouch, MD as PCP - General (Internal Medicine) Sindy Guadeloupe, MD as Consulting Physician (Hematology and Oncology) Sindy Guadeloupe, MD as Consulting Physician (Hematology and Oncology) Sindy Guadeloupe, MD as Consulting Physician (Hematology and Oncology) Dasher, Rayvon Char, MD as Consulting Physician (Dermatology)   Name of the patient: Anthony Gonzales  [id number]  1931-08-23   Date of visit: 02/19/22  Diagnosis- Metastatic urothelial carcinoma with possible mets to the obturator node. Indeterminate hilar lymph nodes and LUL lung nodule    Chief complaint/ Reason for visit-on treatment assessment prior to cycle 3 of palliative Keytruda  Heme/Onc history:  patient is a 88 year old male with past medical history significant for hypertension and long-standing history of superficial bladder cancer for which he sees [name].  He has undergone TURBT as well as ureteroscopy since 2017 along with mitomycin as well in the past.  Most recently he underwent CT abdomen on 08/06/2017 which showed a soft tissue mass in the right renal pelvis concerning for upper tract urothelial neoplasm.  Soft tissue fullness at the right ureterovesical junction with proximal right hydroureteronephrosis.  Several millimeter attenuation lesion in the pancreatic head.   He underwent diagnostic ureteroscopy and was found to have 2 high-grade lesions within his right kidney.  There was a nodular high-grade appearing lesion in the right anterior renal pelvis.  There was also a second ureteral tumor fungating from the right ureteral orifice extending into the distal ureter also consistent with high-grade invasive urothelial carcinoma.  Muscle invasion could not be assessed.  He also underwent a CT chest which showed a rounded nodule in the right upper lobe measuring 14 mm  concerning for metastases.  2 other 3 mm lesions were also noted in the left upper lobe likely benign   Plan initially was neoadjuvant chemotherapy followed by possible surgery but given the presence of lung lesion patient has been referred to oncology for the same.   PET/CT on 09/21/17 showed: IMPRESSION: 1. Hypermetabolic right obturator lymph node is most indicative of metastatic disease. 2. Mildly hypermetabolic left hilar lymph nodes are nonspecific. Continued attention on follow-up exams is warranted. 3. Right upper lobe pulmonary nodule shows metabolism after just above blood pool and is therefore indeterminate. Continued attention on follow-up exams is warranted. 4. Aortic atherosclerosis (ICD10-170.0). Coronary artery calcification. 5. Cholelithiasis    Carboplatin/ gemzar 1 week on and one-week off as patient could not tolerate 2-week on and one week off regimen.  Cycle 1 started on 09/22/2017 Disease progression in March 2020.  Switched to second [employer]   Patient had disease controlled with Keytruda.  However he was noted to have worsening dermatitis with constant flareups requiring steroids.  Beryle Flock is therefore being kept on hold and patient will be switched to third line Padcev.    Treatment has been on hold since February 2021 due to constant flareup of skin rash   Scans in October 2022 showed disease progression with new lung nodules as well as pelvic sidewall adenopathy.  Padcev restarted in November 2022.  Skin rash and blurry vision secondary to Padcev which was put on hold starting May 2023   Progressive disease based on scans in October 2023.  Plan is to rechallenge patient with [facility]  Interval history-rash over bilateral forearms is much improved.  He reports few areas that he noticed over his chest wall.  Otherwise tolerating Beryle Flock  well and denies other complaints at this time.  Blurry vision has resolved  ECOG PS- 1 Pain scale- 0   Review of  systems- Review of Systems  Constitutional:  Negative for chills, fever, malaise/fatigue and weight loss.  HENT:  Negative for congestion, ear discharge and nosebleeds.   Eyes:  Negative for blurred vision.  Respiratory:  Negative for cough, hemoptysis, sputum production, shortness of breath and wheezing.   Cardiovascular:  Negative for chest pain, palpitations, orthopnea and claudication.  Gastrointestinal:  Negative for abdominal pain, blood in stool, constipation, diarrhea, heartburn, melena, nausea and vomiting.  Genitourinary:  Negative for dysuria, flank pain, frequency, hematuria and urgency.  Musculoskeletal:  Negative for back pain, joint pain and myalgias.  Skin:  Positive for rash.  Neurological:  Negative for dizziness, tingling, focal weakness, seizures, weakness and headaches.  Endo/Heme/Allergies:  Does not bruise/bleed easily.  Psychiatric/Behavioral:  Negative for depression and suicidal ideas. The patient does not have insomnia.       Allergies  Allergen Reactions   Demerol [Meperidine] Nausea And Vomiting   Lipitor [Atorvastatin] Swelling   Sulfa Antibiotics Nausea And Vomiting and Rash     Past Medical History:  Diagnosis Date   Arthritis    Benign fibroma of prostate 08/23/2013   BPH (benign prostatic hyperplasia)    Calculus of kidney 08/23/2013   Glaucoma    no drops in 3 mo pressure good, pt denies glaucoma, eye pressure has been measuring alright.   History of kidney stones    HLD (hyperlipidemia)    HOH (hard of hearing)    Left Hearing Aid   HTN (hypertension) 12/26/2014   Hypertension    Hyponatremia 12/26/2014   Migraines    history of migraines when he was younger.   Restless leg syndrome    Sinus drainage    Skin cancer    Skin cancer    left hand 06/2019   Urothelial cancer ([place])    chemo tx's.   UTI (lower urinary tract infection) 12/26/2014   Vertigo      Past Surgical History:  Procedure Laterality Date   COLONOSCOPY      CYSTOSCOPY W/ RETROGRADES Right 01/30/2015   Procedure: CYSTOSCOPY WITH RETROGRADE PYELOGRAM;  Surgeon: [name], MD;  Location: [facility];  Service: Urology;  Laterality: Right;   CYSTOSCOPY W/ RETROGRADES Bilateral 02/26/2016   Procedure: CYSTOSCOPY WITH RETROGRADE PYELOGRAM;  Surgeon: [name], MD;  Location: [facility];  Service: Urology;  Laterality: Bilateral;   CYSTOSCOPY W/ URETERAL STENT PLACEMENT Right 08/20/2015   Procedure: CYSTOSCOPY WITH RETROGRADE PYELOGRAM/POSSIBLE URETERAL STENT PLACEMENT/BLADDER BIOPSY;  Surgeon: [name], MD;  Location: [facility];  Service: Urology;  Laterality: Right;   CYSTOSCOPY W/ URETERAL STENT PLACEMENT Right 09/12/2015   Procedure: CYSTOSCOPY WITH STENT REPLACEMENT;  Surgeon: [name], MD;  Location: [facility];  Service: Urology;  Laterality: Right;   CYSTOSCOPY WITH BIOPSY Right 09/12/2015   Procedure: CYSTOSCOPY WITH BLADDER AND URETERAL BIOPSY;  Surgeon: [name], MD;  Location: [facility];  Service: Urology;  Laterality: Right;   CYSTOSCOPY WITH STENT PLACEMENT Right 01/30/2015   Procedure: CYSTOSCOPY WITH STENT PLACEMENT;  Surgeon: [name], MD;  Location: [facility];  Service: Urology;  Laterality: Right;   CYSTOSCOPY WITH STENT PLACEMENT Right 12/28/2017   Procedure: CYSTOSCOPY WITH STENT Exchange;  Surgeon: [name], MD;  Location: [facility];  Service: Urology;  Laterality: Right;   CYSTOSCOPY/URETEROSCOPY/HOLMIUM LASER/STENT PLACEMENT Right 08/19/2017   Procedure: CYSTOSCOPY/URETEROSCOPY/HOLMIUM LASER/STENT PLACEMENT;  Surgeon: [name], MD;  Location: [facility];  Service: Urology;  Laterality: Right;   EYE SURGERY Bilateral    Cataract Extraction with IOL   goiter removal     HOLMIUM LASER APPLICATION N/A 3/66/2947   Procedure:  HOLMIUM LASER APPLICATION;  Surgeon: [name], MD;  Location: [facility];  Service: Urology;  Laterality: N/A;   PORTA CATH INSERTION N/A 09/23/2017   Procedure: PORTA CATH  INSERTION;  Surgeon: Algernon Huxley, MD;  Location: [facility];  Service: Cardiovascular;  Laterality: N/A;   SPERMATOCELECTOMY     TONSILLECTOMY     TRANSURETHRAL RESECTION OF BLADDER TUMOR N/A 02/26/2016   Procedure: TRANSURETHRAL RESECTION OF BLADDER TUMOR (TURBT);  Surgeon: [name], MD;  Location: [facility];  Service: Urology;  Laterality: N/A;   TRANSURETHRAL RESECTION OF BLADDER TUMOR WITH MITOMYCIN-C N/A 09/12/2015   Procedure: TRANSURETHRAL RESECTION OF BLADDER TUMOR ;  Surgeon: [name], MD;  Location: [facility];  Service: Urology;  Laterality: N/A;   TRANSURETHRAL RESECTION OF BLADDER TUMOR WITH MITOMYCIN-C N/A 03/24/2016   Procedure: TRANSURETHRAL RESECTION OF BLADDER TUMOR WITH MITOMYCIN-C  (SMALL);  Surgeon: [name], MD;  Location: [facility];  Service: Urology;  Laterality: N/A;   URETERAL BIOPSY Right 08/19/2017   Procedure: Renal Mass BIOPSY;  Surgeon: [name], MD;  Location: [facility];  Service: Urology;  Laterality: Right;   URETEROSCOPY Right 01/30/2015   Procedure: URETEROSCOPY/ WITH BIOPSY AND CYTOLOGY BRUSHING;  Surgeon: [name], MD;  Location: [facility];  Service: Urology;  Laterality: Right;   URETEROSCOPY Right 08/20/2015   Procedure: URETEROSCOPY;  Surgeon: [name], MD;  Location: [facility];  Service: Urology;  Laterality: Right;   URETEROSCOPY Right 09/12/2015   Procedure: URETEROSCOPY;  Surgeon: [name], MD;  Location: [facility];  Service: Urology;  Laterality: Right;   URETEROSCOPY Right 02/26/2016   Procedure: URETEROSCOPY;  Surgeon: [name], MD;  Location: [facility];  Service: Urology;  Laterality: Right;    Social History   Socioeconomic History   Marital status: Married    Spouse name: Not on file   Number of children: Not on file   Years of education: Not on file   Highest education level: Not on file  Occupational History   Not on file  Tobacco Use   Smoking status: Never   Smokeless tobacco: Never   Vaping Use   Vaping Use: Never used  Substance and Sexual Activity   Alcohol use: No    Alcohol/week: 0.0 standard drinks of alcohol   Drug use: No   Sexual activity: Not Currently  Other Topics Concern   Not on file  Social History Narrative   Not on file   Social Determinants of Health   Financial Resource Strain: Not on file  Food Insecurity: Not on file  Transportation Needs: Not on file  Physical Activity: Not on file  Stress: Not on file  Social Connections: Not on file  Intimate Partner Violence: Not on file    Family History  Problem Relation Age of Onset   Hypertension Mother    Hypertension Father    Prostate cancer Brother    Kidney disease Neg Hx    Kidney cancer Neg Hx    Bladder Cancer Neg Hx      Current Outpatient Medications:    acetaminophen (TYLENOL) 500 MG tablet, Take 500 mg by mouth every 6 (six) hours as needed., Disp: , Rfl:    amLODipine (NORVASC) 5 MG tablet, , Disp: , Rfl:    camphor-menthol (  SARNA) lotion, Apply 1 application topically as needed for itching., Disp: , Rfl:    diazepam (VALIUM) 5 MG tablet, TAKE 1 TABLET (5 MG TOTAL) BY MOUTH EVERY 12 (TWELVE) HOURS AS NEEDED FOR ANXIETY OR SLEEP, Disp: , Rfl:    docusate sodium (COLACE) 100 MG capsule, Take 100 mg by mouth 2 (two) times daily., Disp: , Rfl:    fenofibrate micronized (LOFIBRA) 134 MG capsule, Take 134 mg by mouth daily before breakfast. , Disp: , Rfl:    gemfibrozil (LOPID) 600 MG tablet, Take 600 mg by mouth daily., Disp: , Rfl:    hydrOXYzine (ATARAX) 10 MG tablet, Take 1 tablet (10 mg total) by mouth 3 (three) times daily as needed for itching., Disp: 120 tablet, Rfl: 0   imipramine (TOFRANIL) 10 MG tablet, Take 10 mg by mouth at bedtime., Disp: , Rfl:    lidocaine-prilocaine (EMLA) cream, Apply to affected area once, Disp: 30 g, Rfl: 3   methotrexate (RHEUMATREX) 2.5 MG tablet, Take by mouth., Disp: , Rfl:    metoprolol succinate (TOPROL-XL) 50 MG 24 hr tablet, Take 50 mg  by mouth daily., Disp: , Rfl:    Multiple Vitamin (MULTIVITAMIN WITH MINERALS) TABS tablet, Take 1 tablet by mouth daily., Disp: , Rfl:    ondansetron (ZOFRAN) 8 MG tablet, Take 1 tablet (8 mg total) by mouth every 8 (eight) hours as needed for nausea or vomiting., Disp: 30 tablet, Rfl: 1   polyethylene glycol (MIRALAX / GLYCOLAX) 17 g packet, Take 17 g by mouth daily., Disp: , Rfl:    psyllium (METAMUCIL) 58.6 % packet, Take 1 packet by mouth daily., Disp: , Rfl:    senna (SENOKOT) 8.6 MG tablet, Take 2 tablets by mouth daily., Disp: , Rfl:    tamsulosin (FLOMAX) 0.4 MG CAPS capsule, Take 1 capsule (0.4 mg total) by mouth daily., Disp: 90 capsule, Rfl: 3   triamcinolone cream (KENALOG) 0.1 %, Apply 1 application. topically 2 (two) times daily., Disp: , Rfl:    aspirin EC 325 MG tablet, Take 325 mg by mouth daily. (Patient not taking: Reported on 01/29/2022), Disp: , Rfl:    folic acid (FOLVITE) 1 MG tablet, Take by mouth. (Patient not taking: Reported on 01/08/2022), Disp: , Rfl:    loperamide (IMODIUM A-D) 2 MG tablet, Take by mouth. (Patient not taking: Reported on 01/08/2022), Disp: , Rfl:    pantoprazole (PROTONIX) 20 MG tablet, Take 1 tablet (20 mg total) by mouth daily for 14 days., Disp: 14 tablet, Rfl: 0   prochlorperazine (COMPAZINE) 10 MG tablet, Take 1 tablet (10 mg total) by mouth every 6 (six) hours as needed for nausea or vomiting. (Patient not taking: Reported on 07/01/2021), Disp: 30 tablet, Rfl: 0   prochlorperazine (COMPAZINE) 10 MG tablet, Take 1 tablet (10 mg total) by mouth every 6 (six) hours as needed for nausea or vomiting. (Patient not taking: Reported on 01/08/2022), Disp: 30 tablet, Rfl: 1 No current facility-administered medications for this visit.  Facility-Administered Medications Ordered in Other Visits:    heparin lock flush 100 UNIT/ML injection, , , ,    heparin lock flush 100 UNIT/ML injection, , , ,    heparin lock flush 100 unit/mL, 500 Units, Intravenous, Once,  Sindy Guadeloupe, MD   sodium chloride flush (NS) 0.9 % injection 10 mL, 10 mL, Intravenous, Once, Sindy Guadeloupe, MD   sodium chloride flush (NS) 0.9 % injection 10 mL, 10 mL, Intravenous, PRN, Sindy Guadeloupe, MD   sodium chloride  flush (NS) 0.9 % injection 10 mL, 10 mL, Intravenous, PRN, Sindy Guadeloupe, MD, 10 mL at 08/21/21 0841  Physical exam:  Vitals:   02/19/22 0932  BP: (!) 161/84  Pulse: 64  Temp: 99.2 F (37.3 C)  TempSrc: Tympanic  Weight: 192 lb (87.1 kg)  Height: 6' (1.829 m)   Physical Exam Cardiovascular:     Rate and Rhythm: Normal rate and regular rhythm.     Heart sounds: Normal heart sounds.  Pulmonary:     Effort: Pulmonary effort is normal.     Breath sounds: Normal breath sounds.  Abdominal:     General: Bowel sounds are normal.     Palpations: Abdomen is soft.  Skin:    Comments: There is no significant rash noted over bilateral upper extremities.  Chest and back is clear for the most part except for 3-4 erythematous acneform lesions noted over upper chest  Neurological:     Mental Status: He is alert and oriented to person, place, and time.         Latest Ref Rng & Units 02/19/2022    9:18 AM  CMP  Glucose 70 - 99 mg/dL 141   BUN 8 - 23 mg/dL 18   Creatinine 0.61 - 1.24 mg/dL 0.86   Sodium 135 - 145 mmol/L 137   Potassium 3.5 - 5.1 mmol/L 4.1   Chloride 98 - 111 mmol/L 104   CO2 22 - 32 mmol/L 26   Calcium 8.9 - 10.3 mg/dL 8.7   Total Protein 6.5 - 8.1 g/dL 6.6   Total Bilirubin 0.3 - 1.2 mg/dL 0.6   Alkaline Phos 38 - 126 U/L 72   AST 15 - 41 U/L 32   ALT 0 - 44 U/L 19       Latest Ref Rng & Units 02/19/2022    9:18 AM  CBC  WBC 4.0 - 10.5 K/uL 7.5   Hemoglobin 13.0 - 17.0 g/dL 14.7   Hematocrit 39.0 - 52.0 % 43.0   Platelets 150 - 400 K/uL 248      Assessment and plan- Patient is a 88 y.o. male with metastatic urothelial cancer with lung and lymph node metastases.  He is here for on treatment assessment prior to cycle 3 of  palliative Keytruda  Skin rash is not extensive presently.  There is a small area noted over upper chest but otherwise his chest and back is clear for the most part.  Counts are otherwise okay to proceed with cycle 3 of palliative Keytruda today  I will see him back in 3 weeks for cycle 4.  Plan to get CT chest abdomen pelvis with contrast about 4 to 5 weeks from now.   Visit Diagnosis 1. Encounter for antineoplastic immunotherapy   2. 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 Randa Evens, MD, MPH [facility] [phone] 02/19/2022 12:15 PM

## 2022-02-22 LAB — T4: T4, Total: 10.2 ug/dL (ref 4.5–12.0)

## 2022-03-06 ENCOUNTER — Other Ambulatory Visit: Payer: Self-pay | Admitting: Oncology

## 2022-03-06 DIAGNOSIS — N401 Enlarged prostate with lower urinary tract symptoms: Secondary | ICD-10-CM

## 2022-03-12 ENCOUNTER — Inpatient Hospital Stay: Payer: PPO | Attending: Oncology

## 2022-03-12 ENCOUNTER — Inpatient Hospital Stay: Payer: PPO

## 2022-03-12 ENCOUNTER — Inpatient Hospital Stay: Payer: PPO | Admitting: Oncology

## 2022-03-12 ENCOUNTER — Encounter: Payer: Self-pay | Admitting: Oncology

## 2022-03-12 VITALS — BP 165/80 | HR 58 | Temp 97.5°F | Resp 16 | Wt 193.0 lb

## 2022-03-12 DIAGNOSIS — R21 Rash and other nonspecific skin eruption: Secondary | ICD-10-CM | POA: Insufficient documentation

## 2022-03-12 DIAGNOSIS — Z85828 Personal history of other malignant neoplasm of skin: Secondary | ICD-10-CM | POA: Diagnosis not present

## 2022-03-12 DIAGNOSIS — C669 Malignant neoplasm of unspecified ureter: Secondary | ICD-10-CM

## 2022-03-12 DIAGNOSIS — Z79899 Other long term (current) drug therapy: Secondary | ICD-10-CM | POA: Diagnosis not present

## 2022-03-12 DIAGNOSIS — R911 Solitary pulmonary nodule: Secondary | ICD-10-CM | POA: Diagnosis not present

## 2022-03-12 DIAGNOSIS — Z5112 Encounter for antineoplastic immunotherapy: Secondary | ICD-10-CM | POA: Insufficient documentation

## 2022-03-12 DIAGNOSIS — Z8249 Family history of ischemic heart disease and other diseases of the circulatory system: Secondary | ICD-10-CM | POA: Insufficient documentation

## 2022-03-12 DIAGNOSIS — C778 Secondary and unspecified malignant neoplasm of lymph nodes of multiple regions: Secondary | ICD-10-CM | POA: Diagnosis not present

## 2022-03-12 DIAGNOSIS — R590 Localized enlarged lymph nodes: Secondary | ICD-10-CM | POA: Insufficient documentation

## 2022-03-12 DIAGNOSIS — I700 Atherosclerosis of aorta: Secondary | ICD-10-CM | POA: Diagnosis not present

## 2022-03-12 DIAGNOSIS — Z882 Allergy status to sulfonamides status: Secondary | ICD-10-CM | POA: Diagnosis not present

## 2022-03-12 DIAGNOSIS — N400 Enlarged prostate without lower urinary tract symptoms: Secondary | ICD-10-CM | POA: Diagnosis not present

## 2022-03-12 DIAGNOSIS — K8020 Calculus of gallbladder without cholecystitis without obstruction: Secondary | ICD-10-CM | POA: Diagnosis not present

## 2022-03-12 DIAGNOSIS — Z87442 Personal history of urinary calculi: Secondary | ICD-10-CM | POA: Insufficient documentation

## 2022-03-12 DIAGNOSIS — Z885 Allergy status to narcotic agent status: Secondary | ICD-10-CM | POA: Diagnosis not present

## 2022-03-12 DIAGNOSIS — I2510 Atherosclerotic heart disease of native coronary artery without angina pectoris: Secondary | ICD-10-CM | POA: Diagnosis not present

## 2022-03-12 DIAGNOSIS — Z87440 Personal history of urinary (tract) infections: Secondary | ICD-10-CM | POA: Diagnosis not present

## 2022-03-12 DIAGNOSIS — I10 Essential (primary) hypertension: Secondary | ICD-10-CM | POA: Insufficient documentation

## 2022-03-12 DIAGNOSIS — Z8042 Family history of malignant neoplasm of prostate: Secondary | ICD-10-CM | POA: Insufficient documentation

## 2022-03-12 DIAGNOSIS — L309 Dermatitis, unspecified: Secondary | ICD-10-CM | POA: Diagnosis not present

## 2022-03-12 DIAGNOSIS — C661 Malignant neoplasm of right ureter: Secondary | ICD-10-CM | POA: Diagnosis not present

## 2022-03-12 LAB — CBC WITH DIFFERENTIAL/PLATELET
Abs Immature Granulocytes: 0.04 10*3/uL (ref 0.00–0.07)
Basophils Absolute: 0.1 10*3/uL (ref 0.0–0.1)
Basophils Relative: 1 %
Eosinophils Absolute: 0.3 10*3/uL (ref 0.0–0.5)
Eosinophils Relative: 3 %
HCT: 41.7 % (ref 39.0–52.0)
Hemoglobin: 14.4 g/dL (ref 13.0–17.0)
Immature Granulocytes: 1 %
Lymphocytes Relative: 26 %
Lymphs Abs: 1.9 10*3/uL (ref 0.7–4.0)
MCH: 30.4 pg (ref 26.0–34.0)
MCHC: 34.5 g/dL (ref 30.0–36.0)
MCV: 88.2 fL (ref 80.0–100.0)
Monocytes Absolute: 0.7 10*3/uL (ref 0.1–1.0)
Monocytes Relative: 10 %
Neutro Abs: 4.5 10*3/uL (ref 1.7–7.7)
Neutrophils Relative %: 59 %
Platelets: 253 10*3/uL (ref 150–400)
RBC: 4.73 MIL/uL (ref 4.22–5.81)
RDW: 11.9 % (ref 11.5–15.5)
WBC: 7.5 10*3/uL (ref 4.0–10.5)
nRBC: 0 % (ref 0.0–0.2)

## 2022-03-12 LAB — COMPREHENSIVE METABOLIC PANEL
ALT: 20 U/L (ref 0–44)
AST: 31 U/L (ref 15–41)
Albumin: 3.6 g/dL (ref 3.5–5.0)
Alkaline Phosphatase: 76 U/L (ref 38–126)
Anion gap: 9 (ref 5–15)
BUN: 21 mg/dL (ref 8–23)
CO2: 26 mmol/L (ref 22–32)
Calcium: 8.4 mg/dL — ABNORMAL LOW (ref 8.9–10.3)
Chloride: 102 mmol/L (ref 98–111)
Creatinine, Ser: 0.87 mg/dL (ref 0.61–1.24)
GFR, Estimated: >60 mL/min (ref >60)
Glucose, Bld: 126 mg/dL — ABNORMAL HIGH (ref 70–99)
Potassium: 3.7 mmol/L (ref 3.5–5.1)
Sodium: 137 mmol/L (ref 135–145)
Total Bilirubin: 0.5 mg/dL (ref 0.3–1.2)
Total Protein: 6.7 g/dL (ref 6.5–8.1)

## 2022-03-12 MED ORDER — SODIUM CHLORIDE 0.9 % IV SOLN
200.0000 mg | Freq: Once | INTRAVENOUS | Status: AC
  Administered 2022-03-12: 200 mg via INTRAVENOUS
  Filled 2022-03-12: qty 200

## 2022-03-12 MED ORDER — SODIUM CHLORIDE 0.9 % IV SOLN
Freq: Once | INTRAVENOUS | Status: AC
  Filled 2022-03-12: qty 250

## 2022-03-12 MED ORDER — HEPARIN SOD (PORK) LOCK FLUSH 100 UNIT/ML IV SOLN
500.0000 [IU] | Freq: Once | INTRAVENOUS | Status: AC | PRN
  Administered 2022-03-12: 500 [IU]
  Filled 2022-03-12: qty 5

## 2022-03-12 NOTE — Progress Notes (Unsigned)
Pt in for follow up, denies any concerns today. 

## 2022-03-13 ENCOUNTER — Encounter: Payer: Self-pay | Admitting: Oncology

## 2022-03-13 NOTE — Progress Notes (Signed)
Hematology/Oncology Consult note [facility]  [phone] [phone]  Patient Care Team: Idelle Crouch, MD as PCP - General (Internal Medicine) Sindy Guadeloupe, MD as Consulting Physician (Hematology and Oncology) Sindy Guadeloupe, MD as Consulting Physician (Hematology and Oncology) Sindy Guadeloupe, MD as Consulting Physician (Hematology and Oncology) Dasher, Rayvon Char, MD as Consulting Physician (Dermatology)   Name of the patient: Anthony Gonzales  [id number]  1931-09-22   Date of visit: 03/13/22  Diagnosis- Metastatic urothelial carcinoma with possible mets to the obturator node. Indeterminate hilar lymph nodes and LUL lung nodule      Chief complaint/ Reason for visit- on treatment assessment prior to cycle 4 of palliative keytruda  Heme/Onc history: patient is a 89 year old male with past medical history significant for hypertension and long-standing history of superficial bladder cancer for which he sees [name].  He has undergone TURBT as well as ureteroscopy since 2017 along with mitomycin as well in the past.  Most recently he underwent CT abdomen on 08/06/2017 which showed a soft tissue mass in the right renal pelvis concerning for upper tract urothelial neoplasm.  Soft tissue fullness at the right ureterovesical junction with proximal right hydroureteronephrosis.  Several millimeter attenuation lesion in the pancreatic head.   He underwent diagnostic ureteroscopy and was found to have 2 high-grade lesions within his right kidney.  There was a nodular high-grade appearing lesion in the right anterior renal pelvis.  There was also a second ureteral tumor fungating from the right ureteral orifice extending into the distal ureter also consistent with high-grade invasive urothelial carcinoma.  Muscle invasion could not be assessed.  He also underwent a CT chest which showed a rounded nodule in the right upper lobe measuring 14 mm  concerning for metastases.  2 other 3 mm lesions were also noted in the left upper lobe likely benign   Plan initially was neoadjuvant chemotherapy followed by possible surgery but given the presence of lung lesion patient has been referred to oncology for the same.   PET/CT on 09/21/17 showed: IMPRESSION: 1. Hypermetabolic right obturator lymph node is most indicative of metastatic disease. 2. Mildly hypermetabolic left hilar lymph nodes are nonspecific. Continued attention on follow-up exams is warranted. 3. Right upper lobe pulmonary nodule shows metabolism after just above blood pool and is therefore indeterminate. Continued attention on follow-up exams is warranted. 4. Aortic atherosclerosis (ICD10-170.0). Coronary artery calcification. 5. Cholelithiasis    Carboplatin/ gemzar 1 week on and one-week off as patient could not tolerate 2-week on and one week off regimen.  Cycle 1 started on 09/22/2017 Disease progression in March 2020.  Switched to second [employer]   Patient had disease controlled with Keytruda.  However he was noted to have worsening dermatitis with constant flareups requiring steroids.  Beryle Flock is therefore being kept on hold and patient will be switched to third line Padcev.    Treatment has been on hold since February 2021 due to constant flareup of skin rash   Scans in October 2022 showed disease progression with new lung nodules as well as pelvic sidewall adenopathy.  Padcev restarted in November 2022.  Skin rash and blurry vision secondary to Padcev which was put on hold starting May 2023   Progressive disease based on scans in October 2023.  Plan is to rechallenge patient with Keytruda  Interval history- tolerating treatment well. Denies any drug rash  ECOG PS- 1 Pain scale- 0   Review of systems- Review  of Systems  Constitutional:  Negative for chills, fever, malaise/fatigue and weight loss.  HENT:  Negative for congestion, ear discharge and nosebleeds.    Eyes:  Negative for blurred vision.  Respiratory:  Negative for cough, hemoptysis, sputum production, shortness of breath and wheezing.   Cardiovascular:  Negative for chest pain, palpitations, orthopnea and claudication.  Gastrointestinal:  Negative for abdominal pain, blood in stool, constipation, diarrhea, heartburn, melena, nausea and vomiting.  Genitourinary:  Negative for dysuria, flank pain, frequency, hematuria and urgency.  Musculoskeletal:  Negative for back pain, joint pain and myalgias.  Skin:  Negative for rash.  Neurological:  Negative for dizziness, tingling, focal weakness, seizures, weakness and headaches.  Endo/Heme/Allergies:  Does not bruise/bleed easily.  Psychiatric/Behavioral:  Negative for depression and suicidal ideas. The patient does not have insomnia.       Allergies  Allergen Reactions   Demerol [Meperidine] Nausea And Vomiting   Lipitor [Atorvastatin] Swelling   Sulfa Antibiotics Nausea And Vomiting and Rash     Past Medical History:  Diagnosis Date   Arthritis    Benign fibroma of prostate 08/23/2013   BPH (benign prostatic hyperplasia)    Calculus of kidney 08/23/2013   Glaucoma    no drops in 3 mo pressure good, pt denies glaucoma, eye pressure has been measuring alright.   History of kidney stones    HLD (hyperlipidemia)    HOH (hard of hearing)    Left Hearing Aid   HTN (hypertension) 12/26/2014   Hypertension    Hyponatremia 12/26/2014   Migraines    history of migraines when he was younger.   Restless leg syndrome    Sinus drainage    Skin cancer    Skin cancer    left hand 06/2019   Urothelial cancer ([place])    chemo tx's.   UTI (lower urinary tract infection) 12/26/2014   Vertigo      Past Surgical History:  Procedure Laterality Date   COLONOSCOPY     CYSTOSCOPY W/ RETROGRADES Right 01/30/2015   Procedure: CYSTOSCOPY WITH RETROGRADE PYELOGRAM;  Surgeon: [name], MD;  Location: [facility];  Service: Urology;  Laterality:  Right;   CYSTOSCOPY W/ RETROGRADES Bilateral 02/26/2016   Procedure: CYSTOSCOPY WITH RETROGRADE PYELOGRAM;  Surgeon: [name], MD;  Location: [facility];  Service: Urology;  Laterality: Bilateral;   CYSTOSCOPY W/ URETERAL STENT PLACEMENT Right 08/20/2015   Procedure: CYSTOSCOPY WITH RETROGRADE PYELOGRAM/POSSIBLE URETERAL STENT PLACEMENT/BLADDER BIOPSY;  Surgeon: [name], MD;  Location: [facility];  Service: Urology;  Laterality: Right;   CYSTOSCOPY W/ URETERAL STENT PLACEMENT Right 09/12/2015   Procedure: CYSTOSCOPY WITH STENT REPLACEMENT;  Surgeon: [name], MD;  Location: [facility];  Service: Urology;  Laterality: Right;   CYSTOSCOPY WITH BIOPSY Right 09/12/2015   Procedure: CYSTOSCOPY WITH BLADDER AND URETERAL BIOPSY;  Surgeon: [name], MD;  Location: [facility];  Service: Urology;  Laterality: Right;   CYSTOSCOPY WITH STENT PLACEMENT Right 01/30/2015   Procedure: CYSTOSCOPY WITH STENT PLACEMENT;  Surgeon: [name], MD;  Location: [facility];  Service: Urology;  Laterality: Right;   CYSTOSCOPY WITH STENT PLACEMENT Right 12/28/2017   Procedure: CYSTOSCOPY WITH STENT Exchange;  Surgeon: [name], MD;  Location: [facility];  Service: Urology;  Laterality: Right;   CYSTOSCOPY/URETEROSCOPY/HOLMIUM LASER/STENT PLACEMENT Right 08/19/2017   Procedure: CYSTOSCOPY/URETEROSCOPY/HOLMIUM LASER/STENT PLACEMENT;  Surgeon: [name], MD;  Location: [facility];  Service: Urology;  Laterality: Right;   EYE SURGERY Bilateral    Cataract Extraction with IOL   goiter removal  HOLMIUM LASER APPLICATION N/A 1/61/0960   Procedure:  HOLMIUM LASER APPLICATION;  Surgeon: [name], MD;  Location: [facility];  Service: Urology;  Laterality: N/A;   PORTA CATH INSERTION N/A 09/23/2017   Procedure: PORTA CATH INSERTION;  Surgeon: Algernon Huxley, MD;  Location: [facility];  Service: Cardiovascular;  Laterality: N/A;   SPERMATOCELECTOMY     TONSILLECTOMY     TRANSURETHRAL RESECTION  OF BLADDER TUMOR N/A 02/26/2016   Procedure: TRANSURETHRAL RESECTION OF BLADDER TUMOR (TURBT);  Surgeon: [name], MD;  Location: [facility];  Service: Urology;  Laterality: N/A;   TRANSURETHRAL RESECTION OF BLADDER TUMOR WITH MITOMYCIN-C N/A 09/12/2015   Procedure: TRANSURETHRAL RESECTION OF BLADDER TUMOR ;  Surgeon: [name], MD;  Location: [facility];  Service: Urology;  Laterality: N/A;   TRANSURETHRAL RESECTION OF BLADDER TUMOR WITH MITOMYCIN-C N/A 03/24/2016   Procedure: TRANSURETHRAL RESECTION OF BLADDER TUMOR WITH MITOMYCIN-C  (SMALL);  Surgeon: [name], MD;  Location: [facility];  Service: Urology;  Laterality: N/A;   URETERAL BIOPSY Right 08/19/2017   Procedure: Renal Mass BIOPSY;  Surgeon: [name], MD;  Location: [facility];  Service: Urology;  Laterality: Right;   URETEROSCOPY Right 01/30/2015   Procedure: URETEROSCOPY/ WITH BIOPSY AND CYTOLOGY BRUSHING;  Surgeon: [name], MD;  Location: [facility];  Service: Urology;  Laterality: Right;   URETEROSCOPY Right 08/20/2015   Procedure: URETEROSCOPY;  Surgeon: [name], MD;  Location: [facility];  Service: Urology;  Laterality: Right;   URETEROSCOPY Right 09/12/2015   Procedure: URETEROSCOPY;  Surgeon: [name], MD;  Location: [facility];  Service: Urology;  Laterality: Right;   URETEROSCOPY Right 02/26/2016   Procedure: URETEROSCOPY;  Surgeon: [name], MD;  Location: [facility];  Service: Urology;  Laterality: Right;    Social History   Socioeconomic History   Marital status: Married    Spouse name: Not on file   Number of children: Not on file   Years of education: Not on file   Highest education level: Not on file  Occupational History   Not on file  Tobacco Use   Smoking status: Never   Smokeless tobacco: Never  Vaping Use   Vaping Use: Never used  Substance and Sexual Activity   Alcohol use: No    Alcohol/week: 0.0 standard drinks of alcohol   Drug use: No   Sexual activity: Not Currently   Other Topics Concern   Not on file  Social History Narrative   Not on file   Social Determinants of Health   Financial Resource Strain: Not on file  Food Insecurity: Not on file  Transportation Needs: Not on file  Physical Activity: Not on file  Stress: Not on file  Social Connections: Not on file  Intimate Partner Violence: Not on file    Family History  Problem Relation Age of Onset   Hypertension Mother    Hypertension Father    Prostate cancer Brother    Kidney disease Neg Hx    Kidney cancer Neg Hx    Bladder Cancer Neg Hx      Current Outpatient Medications:    acetaminophen (TYLENOL) 500 MG tablet, Take 500 mg by mouth every 6 (six) hours as needed., Disp: , Rfl:    amLODipine (NORVASC) 5 MG tablet, , Disp: , Rfl:    aspirin EC 325 MG tablet, Take 325 mg by mouth daily., Disp: , Rfl:    camphor-menthol (SARNA) lotion, Apply 1 application topically as needed for itching., Disp: ,  Rfl:    diazepam (VALIUM) 5 MG tablet, TAKE 1 TABLET (5 MG TOTAL) BY MOUTH EVERY 12 (TWELVE) HOURS AS NEEDED FOR ANXIETY OR SLEEP, Disp: , Rfl:    docusate sodium (COLACE) 100 MG capsule, Take 100 mg by mouth 2 (two) times daily., Disp: , Rfl:    fenofibrate micronized (LOFIBRA) 134 MG capsule, Take 134 mg by mouth daily before breakfast. , Disp: , Rfl:    gemfibrozil (LOPID) 600 MG tablet, Take 600 mg by mouth daily., Disp: , Rfl:    hydrOXYzine (ATARAX) 10 MG tablet, Take 1 tablet (10 mg total) by mouth 3 (three) times daily as needed for itching., Disp: 120 tablet, Rfl: 0   imipramine (TOFRANIL) 10 MG tablet, Take 10 mg by mouth at bedtime., Disp: , Rfl:    lidocaine-prilocaine (EMLA) cream, Apply to affected area once, Disp: 30 g, Rfl: 3   methotrexate (RHEUMATREX) 2.5 MG tablet, Take by mouth., Disp: , Rfl:    metoprolol succinate (TOPROL-XL) 50 MG 24 hr tablet, Take 50 mg by mouth daily., Disp: , Rfl:    Multiple Vitamin (MULTIVITAMIN WITH MINERALS) TABS tablet, Take 1 tablet by mouth  daily., Disp: , Rfl:    ondansetron (ZOFRAN) 8 MG tablet, Take 1 tablet (8 mg total) by mouth every 8 (eight) hours as needed for nausea or vomiting., Disp: 30 tablet, Rfl: 1   pantoprazole (PROTONIX) 20 MG tablet, Take 1 tablet (20 mg total) by mouth daily for 14 days., Disp: 14 tablet, Rfl: 0   polyethylene glycol (MIRALAX / GLYCOLAX) 17 g packet, Take 17 g by mouth daily., Disp: , Rfl:    psyllium (METAMUCIL) 58.6 % packet, Take 1 packet by mouth daily., Disp: , Rfl:    senna (SENOKOT) 8.6 MG tablet, Take 2 tablets by mouth daily., Disp: , Rfl:    tamsulosin (FLOMAX) 0.4 MG CAPS capsule, TAKE 1 CAPSULE BY MOUTH EVERY DAY, Disp: 90 capsule, Rfl: 3   triamcinolone cream (KENALOG) 0.1 %, Apply 1 application. topically 2 (two) times daily., Disp: , Rfl:    loperamide (IMODIUM A-D) 2 MG tablet, Take by mouth. (Patient not taking: Reported on 03/12/2022), Disp: , Rfl:    prochlorperazine (COMPAZINE) 10 MG tablet, Take 1 tablet (10 mg total) by mouth every 6 (six) hours as needed for nausea or vomiting. (Patient not taking: Reported on 07/01/2021), Disp: 30 tablet, Rfl: 0 No current facility-administered medications for this visit.  Facility-Administered Medications Ordered in Other Visits:    heparin lock flush 100 UNIT/ML injection, , , ,    heparin lock flush 100 UNIT/ML injection, , , ,    heparin lock flush 100 UNIT/ML injection, , , ,    heparin lock flush 100 unit/mL, 500 Units, Intravenous, Once, Sindy Guadeloupe, MD   sodium chloride flush (NS) 0.9 % injection 10 mL, 10 mL, Intravenous, Once, Sindy Guadeloupe, MD   sodium chloride flush (NS) 0.9 % injection 10 mL, 10 mL, Intravenous, PRN, Sindy Guadeloupe, MD   sodium chloride flush (NS) 0.9 % injection 10 mL, 10 mL, Intravenous, PRN, Sindy Guadeloupe, MD, 10 mL at 08/21/21 0841  Physical exam:  Vitals:   03/12/22 1429  BP: (!) 165/80  Pulse: (!) 58  Resp: 16  Temp: (!) 97.5 F (36.4 C)  TempSrc: Tympanic  SpO2: 100%  Weight: 193 lb (87.5  kg)   Physical Exam Cardiovascular:     Rate and Rhythm: Normal rate and regular rhythm.     Heart sounds:  Normal heart sounds.  Pulmonary:     Effort: Pulmonary effort is normal.     Breath sounds: Normal breath sounds.  Abdominal:     General: Bowel sounds are normal.     Palpations: Abdomen is soft.  Skin:    General: Skin is warm and dry.  Neurological:     Mental Status: He is alert and oriented to person, place, and time.         Latest Ref Rng & Units 03/12/2022    2:08 PM  CMP  Glucose 70 - 99 mg/dL 126   BUN 8 - 23 mg/dL 21   Creatinine 0.61 - 1.24 mg/dL 0.87   Sodium 135 - 145 mmol/L 137   Potassium 3.5 - 5.1 mmol/L 3.7   Chloride 98 - 111 mmol/L 102   CO2 22 - 32 mmol/L 26   Calcium 8.9 - 10.3 mg/dL 8.4   Total Protein 6.5 - 8.1 g/dL 6.7   Total Bilirubin 0.3 - 1.2 mg/dL 0.5   Alkaline Phos 38 - 126 U/L 76   AST 15 - 41 U/L 31   ALT 0 - 44 U/L 20       Latest Ref Rng & Units 03/12/2022    2:08 PM  CBC  WBC 4.0 - 10.5 K/uL 7.5   Hemoglobin 13.0 - 17.0 g/dL 14.4   Hematocrit 39.0 - 52.0 % 41.7   Platelets 150 - 400 K/uL 253     Assessment and plan- Patient is a 89 y.o. male with metastatic urothelial cancer with lung and lymph node metastases.  He is here for on treatment assessment prior to cycle 4 of palliative keytruda  Counts ok to proceed with cycle 4 of palliative [place] today. He has scans coming up soon. Tolerating rx well without any skin rash. I will see him in 3 weeks for cycle 5   Visit Diagnosis 1. 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 Encounter for antineoplastic immunotherapy      Dr. Randa Evens, MD, MPH [facility] at [facility] [phone] 03/13/2022 12:58 PM

## 2022-03-14 ENCOUNTER — Ambulatory Visit
Admission: RE | Admit: 2022-03-14 | Discharge: 2022-03-14 | Disposition: A | Discharge status: Home / Self Care | Payer: PPO | Source: Ambulatory Visit | Attending: Oncology | Admitting: Oncology

## 2022-03-14 DIAGNOSIS — R590 Localized enlarged lymph nodes: Secondary | ICD-10-CM | POA: Diagnosis not present

## 2022-03-14 DIAGNOSIS — N281 Cyst of kidney, acquired: Secondary | ICD-10-CM | POA: Diagnosis not present

## 2022-03-14 DIAGNOSIS — N2889 Other specified disorders of kidney and ureter: Secondary | ICD-10-CM | POA: Diagnosis not present

## 2022-03-14 DIAGNOSIS — C669 Malignant neoplasm of unspecified ureter: Secondary | ICD-10-CM | POA: Diagnosis not present

## 2022-03-14 DIAGNOSIS — R599 Enlarged lymph nodes, unspecified: Secondary | ICD-10-CM | POA: Diagnosis not present

## 2022-03-14 DIAGNOSIS — R918 Other nonspecific abnormal finding of lung field: Secondary | ICD-10-CM | POA: Insufficient documentation

## 2022-03-14 DIAGNOSIS — K8020 Calculus of gallbladder without cholecystitis without obstruction: Secondary | ICD-10-CM | POA: Insufficient documentation

## 2022-03-14 DIAGNOSIS — I700 Atherosclerosis of aorta: Secondary | ICD-10-CM | POA: Diagnosis not present

## 2022-03-14 DIAGNOSIS — C661 Malignant neoplasm of right ureter: Secondary | ICD-10-CM | POA: Diagnosis not present

## 2022-03-14 DIAGNOSIS — N323 Diverticulum of bladder: Secondary | ICD-10-CM | POA: Insufficient documentation

## 2022-03-14 MED ORDER — IOHEXOL 300 MG/ML  SOLN
85.0000 mL | Freq: Once | INTRAMUSCULAR | Status: AC | PRN
  Administered 2022-03-14: 85 mL via INTRAVENOUS

## 2022-03-31 DIAGNOSIS — G720 Drug-induced myopathy: Secondary | ICD-10-CM | POA: Diagnosis not present

## 2022-03-31 DIAGNOSIS — C676 Malignant neoplasm of ureteric orifice: Secondary | ICD-10-CM | POA: Diagnosis not present

## 2022-03-31 DIAGNOSIS — I10 Essential (primary) hypertension: Secondary | ICD-10-CM | POA: Diagnosis not present

## 2022-03-31 DIAGNOSIS — C7910 Secondary malignant neoplasm of unspecified urinary organs: Secondary | ICD-10-CM | POA: Diagnosis not present

## 2022-03-31 DIAGNOSIS — E782 Mixed hyperlipidemia: Secondary | ICD-10-CM | POA: Diagnosis not present

## 2022-03-31 DIAGNOSIS — Z79899 Other long term (current) drug therapy: Secondary | ICD-10-CM | POA: Diagnosis not present

## 2022-03-31 DIAGNOSIS — R7303 Prediabetes: Secondary | ICD-10-CM | POA: Insufficient documentation

## 2022-03-31 DIAGNOSIS — N183 Chronic kidney disease, stage 3 unspecified: Secondary | ICD-10-CM | POA: Diagnosis not present

## 2022-03-31 DIAGNOSIS — R9389 Abnormal findings on diagnostic imaging of other specified body structures: Secondary | ICD-10-CM | POA: Diagnosis not present

## 2022-03-31 DIAGNOSIS — R739 Hyperglycemia, unspecified: Secondary | ICD-10-CM | POA: Diagnosis not present

## 2022-03-31 DIAGNOSIS — T466X5A Adverse effect of antihyperlipidemic and antiarteriosclerotic drugs, initial encounter: Secondary | ICD-10-CM | POA: Diagnosis not present

## 2022-04-01 ENCOUNTER — Other Ambulatory Visit: Payer: Self-pay | Admitting: Nurse Practitioner

## 2022-04-02 ENCOUNTER — Inpatient Hospital Stay: Payer: PPO

## 2022-04-02 ENCOUNTER — Inpatient Hospital Stay: Payer: PPO | Admitting: Oncology

## 2022-04-02 ENCOUNTER — Encounter: Payer: Self-pay | Admitting: Oncology

## 2022-04-02 VITALS — BP 165/75 | HR 51 | Resp 18 | Wt 194.0 lb

## 2022-04-02 DIAGNOSIS — C669 Malignant neoplasm of unspecified ureter: Secondary | ICD-10-CM

## 2022-04-02 DIAGNOSIS — Z5112 Encounter for antineoplastic immunotherapy: Secondary | ICD-10-CM | POA: Diagnosis not present

## 2022-04-02 DIAGNOSIS — Z7189 Other specified counseling: Secondary | ICD-10-CM

## 2022-04-02 LAB — CBC WITH DIFFERENTIAL/PLATELET
Abs Immature Granulocytes: 0.03 10*3/uL (ref 0.00–0.07)
Basophils Absolute: 0.1 10*3/uL (ref 0.0–0.1)
Basophils Relative: 1 %
Eosinophils Absolute: 0.2 10*3/uL (ref 0.0–0.5)
Eosinophils Relative: 3 %
HCT: 44.5 % (ref 39.0–52.0)
Hemoglobin: 14.7 g/dL (ref 13.0–17.0)
Immature Granulocytes: 0 %
Lymphocytes Relative: 29 %
Lymphs Abs: 2.4 10*3/uL (ref 0.7–4.0)
MCH: 30.2 pg (ref 26.0–34.0)
MCHC: 33 g/dL (ref 30.0–36.0)
MCV: 91.4 fL (ref 80.0–100.0)
Monocytes Absolute: 0.8 10*3/uL (ref 0.1–1.0)
Monocytes Relative: 9 %
Neutro Abs: 4.8 10*3/uL (ref 1.7–7.7)
Neutrophils Relative %: 58 %
Platelets: 267 10*3/uL (ref 150–400)
RBC: 4.87 MIL/uL (ref 4.22–5.81)
RDW: 12 % (ref 11.5–15.5)
WBC: 8.3 10*3/uL (ref 4.0–10.5)
nRBC: 0 % (ref 0.0–0.2)

## 2022-04-02 LAB — COMPREHENSIVE METABOLIC PANEL
ALT: 22 U/L (ref 0–44)
AST: 35 U/L (ref 15–41)
Albumin: 3.5 g/dL (ref 3.5–5.0)
Alkaline Phosphatase: 81 U/L (ref 38–126)
Anion gap: 8 (ref 5–15)
BUN: 18 mg/dL (ref 8–23)
CO2: 26 mmol/L (ref 22–32)
Calcium: 8.7 mg/dL — ABNORMAL LOW (ref 8.9–10.3)
Chloride: 101 mmol/L (ref 98–111)
Creatinine, Ser: 0.84 mg/dL (ref 0.61–1.24)
GFR, Estimated: >60 mL/min (ref >60)
Glucose, Bld: 125 mg/dL — ABNORMAL HIGH (ref 70–99)
Potassium: 4 mmol/L (ref 3.5–5.1)
Sodium: 135 mmol/L (ref 135–145)
Total Bilirubin: 0.4 mg/dL (ref 0.3–1.2)
Total Protein: 7 g/dL (ref 6.5–8.1)

## 2022-04-02 MED ORDER — SODIUM CHLORIDE 0.9 % IV SOLN
Freq: Once | INTRAVENOUS | Status: AC
  Filled 2022-04-02: qty 250

## 2022-04-02 MED ORDER — SODIUM CHLORIDE 0.9 % IV SOLN
200.0000 mg | Freq: Once | INTRAVENOUS | Status: AC
  Administered 2022-04-02: 200 mg via INTRAVENOUS
  Filled 2022-04-02: qty 8

## 2022-04-02 MED ORDER — HEPARIN SOD (PORK) LOCK FLUSH 100 UNIT/ML IV SOLN
500.0000 [IU] | Freq: Once | INTRAVENOUS | Status: AC | PRN
  Administered 2022-04-02: 500 [IU]
  Filled 2022-04-02: qty 5

## 2022-04-02 NOTE — Patient Instructions (Signed)
[facility]  Discharge Instructions: Thank you for choosing [facility] to provide your oncology and hematology care.  If you have a lab appointment with the [facility], please go directly to the [facility] and check in at the registration area.  Wear comfortable clothing and clothing appropriate for easy access to any Portacath or PICC line.   We strive to give you quality time with your provider. You may need to reschedule your appointment if you arrive late (15 or more minutes).  Arriving late affects you and other patients whose appointments are after yours.  Also, if you miss three or more appointments without notifying the office, you may be dismissed from the clinic at the provider's discretion.      For prescription refill requests, have your pharmacy contact our office and allow 72 hours for refills to be completed.    Today you received the following chemotherapy and/or immunotherapy agents KEYTRUDA       To help prevent nausea and vomiting after your treatment, we encourage you to take your nausea medication as directed.  BELOW ARE SYMPTOMS THAT SHOULD BE REPORTED IMMEDIATELY: *FEVER GREATER THAN 100.4 F (38 C) OR HIGHER *CHILLS OR SWEATING *NAUSEA AND VOMITING THAT IS NOT CONTROLLED WITH YOUR NAUSEA MEDICATION *UNUSUAL SHORTNESS OF BREATH *UNUSUAL BRUISING OR BLEEDING *URINARY PROBLEMS (pain or burning when urinating, or frequent urination) *BOWEL PROBLEMS (unusual diarrhea, constipation, pain near the anus) TENDERNESS IN MOUTH AND THROAT WITH OR WITHOUT PRESENCE OF ULCERS (sore throat, sores in mouth, or a toothache) UNUSUAL RASH, SWELLING OR PAIN  UNUSUAL VAGINAL DISCHARGE OR ITCHING   Items with * indicate a potential emergency and should be followed up as soon as possible or go to the Emergency Department if any problems should occur.  Please show the CHEMOTHERAPY ALERT CARD or IMMUNOTHERAPY ALERT CARD at check-in to  the Emergency Department and triage nurse.  Should you have questions after your visit or need to cancel or reschedule your appointment, please contact [facility]  [phone] and follow the prompts.  Office hours are 8:00 a.m. to 4:30 p.m. Monday - Friday. Please note that voicemails left after 4:00 p.m. may not be returned until the following business day.  We are closed weekends and major holidays. You have access to a nurse at all times for urgent questions. Please call the main number to the clinic [phone] and follow the prompts.  For any non-urgent questions, you may also contact your provider using MyChart. We now offer e-Visits for anyone 54 and older to request care online for non-urgent symptoms. For details visit [email or web address].  Pembrolizumab Injection What is this medication? PEMBROLIZUMAB (PEM broe LIZ ue mab) treats some types of cancer. It works by helping your immune system slow or stop the spread of cancer cells. It is a monoclonal antibody. This medicine may be used for other purposes; ask your health care provider or pharmacist if you have questions. COMMON BRAND NAME(S): Keytruda What should I tell my care team before I take this medication? They need to know if you have any of these conditions: Allogeneic stem cell transplant (uses someone else's stem cells) Autoimmune diseases, such as Crohn disease, ulcerative colitis, lupus History of chest radiation Nervous system problems, such as Guillain-Barre syndrome, myasthenia gravis Organ transplant An unusual or allergic reaction to pembrolizumab, other medications, foods, dyes, or preservatives Pregnant or trying to get pregnant Breast-feeding How should I use this medication?  This medication is injected into a vein. It is given by your care team in a hospital or clinic setting. A special MedGuide will be given to you before each treatment. Be sure to read this information  carefully each time. Talk to your care team about the use of this medication in children. While it may be prescribed for children as young as 6 months for selected conditions, precautions do apply. Overdosage: If you think you have taken too much of this medicine contact a poison control center or emergency room at once. NOTE: This medicine is only for you. Do not share this medicine with others. What if I miss a dose? Keep appointments for follow-up doses. It is important not to miss your dose. Call your care team if you are unable to keep an appointment. What may interact with this medication? Interactions have not been studied. This list may not describe all possible interactions. Give your health care provider a list of all the medicines, herbs, non-prescription drugs, or dietary supplements you use. Also tell them if you smoke, drink alcohol, or use illegal drugs. Some items may interact with your medicine. What should I watch for while using this medication? Your condition will be monitored carefully while you are receiving this medication. You may need blood work while taking this medication. This medication may cause serious skin reactions. They can happen weeks to months after starting the medication. Contact your care team right away if you notice fevers or flu-like symptoms with a rash. The rash may be red or purple and then turn into blisters or peeling of the skin. You may also notice a red rash with swelling of the face, lips, or lymph nodes in your neck or under your arms. Tell your care team right away if you have any change in your eyesight. Talk to your care team if you may be pregnant. Serious birth defects can occur if you take this medication during pregnancy and for 4 months after the last dose. You will need a negative pregnancy test before starting this medication. Contraception is recommended while taking this medication and for 4 months after the last dose. Your care team can  help you find the option that works for you. Do not breastfeed while taking this medication and for 4 months after the last dose. What side effects may I notice from receiving this medication? Side effects that you should report to your care team as soon as possible: Allergic reactions--skin rash, itching, hives, swelling of the face, lips, tongue, or throat Dry cough, shortness of breath or trouble breathing Eye pain, redness, irritation, or discharge with blurry or decreased vision Heart muscle inflammation--unusual weakness or fatigue, shortness of breath, chest pain, fast or irregular heartbeat, dizziness, swelling of the ankles, feet, or hands Hormone gland problems--headache, sensitivity to light, unusual weakness or fatigue, dizziness, fast or irregular heartbeat, increased sensitivity to cold or heat, excessive sweating, constipation, hair loss, increased thirst or amount of urine, tremors or shaking, irritability Infusion reactions--chest pain, shortness of breath or trouble breathing, feeling faint or lightheaded Kidney injury (glomerulonephritis)--decrease in the amount of urine, red or dark brown urine, foamy or bubbly urine, swelling of the ankles, hands, or feet Liver injury--right upper belly pain, loss of appetite, nausea, light-colored stool, dark yellow or brown urine, yellowing skin or eyes, unusual weakness or fatigue Pain, tingling, or numbness in the hands or feet, muscle weakness, change in vision, confusion or trouble speaking, loss of balance or coordination, trouble walking, seizures  Rash, fever, and swollen lymph nodes Redness, blistering, peeling, or loosening of the skin, including inside the mouth Sudden or severe stomach pain, bloody diarrhea, fever, nausea, vomiting Side effects that usually do not require medical attention (report to your care team if they continue or are bothersome): Bone, joint, or muscle pain Diarrhea Fatigue Loss of appetite Nausea Skin  rash This list may not describe all possible side effects. Call your doctor for medical advice about side effects. You may report side effects to FDA at 1-800-FDA-1088. Where should I keep my medication? This medication is given in a hospital or clinic. It will not be stored at home. NOTE: This sheet is a summary. It may not cover all possible information. If you have questions about this medicine, talk to your doctor, pharmacist, or health care provider.  2023 Elsevier/Gold Standard (2012-11-15 00:00:00)    Also download the MyChart app! Go to the app store, search "MyChart", open the app, select [place], and log in with your MyChart username and password.

## 2022-04-03 ENCOUNTER — Encounter: Payer: Self-pay | Admitting: Oncology

## 2022-04-03 NOTE — Progress Notes (Signed)
Hematology/Oncology Consult note [facility]  [phone] [phone]  Patient Care Team: Idelle Crouch, MD as PCP - General (Internal Medicine) Sindy Guadeloupe, MD as Consulting Physician (Hematology and Oncology) Sindy Guadeloupe, MD as Consulting Physician (Hematology and Oncology) Sindy Guadeloupe, MD as Consulting Physician (Hematology and Oncology) Dasher, Rayvon Char, MD as Consulting Physician (Dermatology)   Name of the patient: Anthony Gonzales  [id number]  [birth year]   Date of visit: 04/03/22  Diagnosis- Metastatic urothelial carcinoma with possible mets to the obturator node. Indeterminate hilar lymph nodes and LUL lung nodule    Chief complaint/ Reason for visit-on treatment assessment prior to cycle 5 of palliative Keytruda  Heme/Onc history: patient is a 89 year old male with past medical history significant for hypertension and long-standing history of superficial bladder cancer for which he sees [name].  He has undergone TURBT as well as ureteroscopy since 2017 along with mitomycin as well in the past.  Most recently he underwent CT abdomen on 08/06/2017 which showed a soft tissue mass in the right renal pelvis concerning for upper tract urothelial neoplasm.  Soft tissue fullness at the right ureterovesical junction with proximal right hydroureteronephrosis.  Several millimeter attenuation lesion in the pancreatic head.   He underwent diagnostic ureteroscopy and was found to have 2 high-grade lesions within his right kidney.  There was a nodular high-grade appearing lesion in the right anterior renal pelvis.  There was also a second ureteral tumor fungating from the right ureteral orifice extending into the distal ureter also consistent with high-grade invasive urothelial carcinoma.  Muscle invasion could not be assessed.  He also underwent a CT chest which showed a rounded nodule in the right upper lobe measuring 14 mm  concerning for metastases.  2 other 3 mm lesions were also noted in the left upper lobe likely benign   Plan initially was neoadjuvant chemotherapy followed by possible surgery but given the presence of lung lesion patient has been referred to oncology for the same.   PET/CT on 09/21/17 showed: IMPRESSION: 1. Hypermetabolic right obturator lymph node is most indicative of metastatic disease. 2. Mildly hypermetabolic left hilar lymph nodes are nonspecific. Continued attention on follow-up exams is warranted. 3. Right upper lobe pulmonary nodule shows metabolism after just above blood pool and is therefore indeterminate. Continued attention on follow-up exams is warranted. 4. Aortic atherosclerosis (ICD10-170.0). Coronary artery calcification. 5. Cholelithiasis    Carboplatin/ gemzar 1 week on and one-week off as patient could not tolerate 2-week on and one week off regimen.  Cycle 1 started on 09/22/2017 Disease progression in March 2020.  Switched to second [employer]   Patient had disease controlled with Keytruda.  However he was noted to have worsening dermatitis with constant flareups requiring steroids.  Beryle Flock is therefore being kept on hold and patient will be switched to third line Padcev.    Treatment has been on hold since February 2021 due to constant flareup of skin rash   Scans in October 2022 showed disease progression with new lung nodules as well as pelvic sidewall adenopathy.  Padcev restarted in November 2022.  Skin rash and blurry vision secondary to Padcev which was put on hold starting May 2023   Progressive disease based on scans in October 2023.  Plan is to rechallenge patient with Keytruda    Interval history-tolerating Keytruda well.  He has 2 small areas of pinpoint rash 1 over his left arm and the other 1 over  his right thigh which has not been bothering him.  Otherwise denies any other side effects from Keytruda  ECOG PS- 1 Pain scale- 0   Review of  systems- Review of Systems  Constitutional:  Positive for malaise/fatigue. Negative for chills, fever and weight loss.  HENT:  Negative for congestion, ear discharge and nosebleeds.   Eyes:  Negative for blurred vision.  Respiratory:  Negative for cough, hemoptysis, sputum production, shortness of breath and wheezing.   Cardiovascular:  Negative for chest pain, palpitations, orthopnea and claudication.  Gastrointestinal:  Negative for abdominal pain, blood in stool, constipation, diarrhea, heartburn, melena, nausea and vomiting.  Genitourinary:  Negative for dysuria, flank pain, frequency, hematuria and urgency.  Musculoskeletal:  Negative for back pain, joint pain and myalgias.  Skin:  Negative for rash.  Neurological:  Negative for dizziness, tingling, focal weakness, seizures, weakness and headaches.  Endo/Heme/Allergies:  Does not bruise/bleed easily.  Psychiatric/Behavioral:  Negative for depression and suicidal ideas. The patient does not have insomnia.       Allergies  Allergen Reactions   Demerol [Meperidine] Nausea And Vomiting   Lipitor [Atorvastatin] Swelling   Sulfa Antibiotics Nausea And Vomiting and Rash     Past Medical History:  Diagnosis Date   Arthritis    Benign fibroma of prostate 08/23/2013   BPH (benign prostatic hyperplasia)    Calculus of kidney 08/23/2013   Glaucoma    no drops in 3 mo pressure good, pt denies glaucoma, eye pressure has been measuring alright.   History of kidney stones    HLD (hyperlipidemia)    HOH (hard of hearing)    Left Hearing Aid   HTN (hypertension) 12/26/2014   Hypertension    Hyponatremia 12/26/2014   Migraines    history of migraines when he was younger.   Restless leg syndrome    Sinus drainage    Skin cancer    Skin cancer    left hand 06/2019   Urothelial cancer ([place])    chemo tx's.   UTI (lower urinary tract infection) 12/26/2014   Vertigo      Past Surgical History:  Procedure Laterality Date   COLONOSCOPY      CYSTOSCOPY W/ RETROGRADES Right 01/30/2015   Procedure: CYSTOSCOPY WITH RETROGRADE PYELOGRAM;  Surgeon: [name], MD;  Location: [facility];  Service: Urology;  Laterality: Right;   CYSTOSCOPY W/ RETROGRADES Bilateral 02/26/2016   Procedure: CYSTOSCOPY WITH RETROGRADE PYELOGRAM;  Surgeon: [name], MD;  Location: [facility];  Service: Urology;  Laterality: Bilateral;   CYSTOSCOPY W/ URETERAL STENT PLACEMENT Right 08/20/2015   Procedure: CYSTOSCOPY WITH RETROGRADE PYELOGRAM/POSSIBLE URETERAL STENT PLACEMENT/BLADDER BIOPSY;  Surgeon: [name], MD;  Location: [facility];  Service: Urology;  Laterality: Right;   CYSTOSCOPY W/ URETERAL STENT PLACEMENT Right 09/12/2015   Procedure: CYSTOSCOPY WITH STENT REPLACEMENT;  Surgeon: [name], MD;  Location: [facility];  Service: Urology;  Laterality: Right;   CYSTOSCOPY WITH BIOPSY Right 09/12/2015   Procedure: CYSTOSCOPY WITH BLADDER AND URETERAL BIOPSY;  Surgeon: [name], MD;  Location: [facility];  Service: Urology;  Laterality: Right;   CYSTOSCOPY WITH STENT PLACEMENT Right 01/30/2015   Procedure: CYSTOSCOPY WITH STENT PLACEMENT;  Surgeon: [name], MD;  Location: [facility];  Service: Urology;  Laterality: Right;   CYSTOSCOPY WITH STENT PLACEMENT Right 12/28/2017   Procedure: CYSTOSCOPY WITH STENT Exchange;  Surgeon: [name], MD;  Location: [facility];  Service: Urology;  Laterality: Right;   CYSTOSCOPY/URETEROSCOPY/HOLMIUM LASER/STENT PLACEMENT Right 08/19/2017   Procedure: CYSTOSCOPY/URETEROSCOPY/HOLMIUM  LASER/STENT PLACEMENT;  Surgeon: [name], MD;  Location: [facility];  Service: Urology;  Laterality: Right;   EYE SURGERY Bilateral    Cataract Extraction with IOL   goiter removal     HOLMIUM LASER APPLICATION N/A 1/61/0960   Procedure:  HOLMIUM LASER APPLICATION;  Surgeon: [name], MD;  Location: [facility];  Service: Urology;  Laterality: N/A;   PORTA CATH INSERTION N/A 09/23/2017   Procedure: PORTA CATH  INSERTION;  Surgeon: Algernon Huxley, MD;  Location: [facility];  Service: Cardiovascular;  Laterality: N/A;   SPERMATOCELECTOMY     TONSILLECTOMY     TRANSURETHRAL RESECTION OF BLADDER TUMOR N/A 02/26/2016   Procedure: TRANSURETHRAL RESECTION OF BLADDER TUMOR (TURBT);  Surgeon: [name], MD;  Location: [facility];  Service: Urology;  Laterality: N/A;   TRANSURETHRAL RESECTION OF BLADDER TUMOR WITH MITOMYCIN-C N/A 09/12/2015   Procedure: TRANSURETHRAL RESECTION OF BLADDER TUMOR ;  Surgeon: [name], MD;  Location: [facility];  Service: Urology;  Laterality: N/A;   TRANSURETHRAL RESECTION OF BLADDER TUMOR WITH MITOMYCIN-C N/A 03/24/2016   Procedure: TRANSURETHRAL RESECTION OF BLADDER TUMOR WITH MITOMYCIN-C  (SMALL);  Surgeon: [name], MD;  Location: [facility];  Service: Urology;  Laterality: N/A;   URETERAL BIOPSY Right 08/19/2017   Procedure: Renal Mass BIOPSY;  Surgeon: [name], MD;  Location: [facility];  Service: Urology;  Laterality: Right;   URETEROSCOPY Right 01/30/2015   Procedure: URETEROSCOPY/ WITH BIOPSY AND CYTOLOGY BRUSHING;  Surgeon: [name], MD;  Location: [facility];  Service: Urology;  Laterality: Right;   URETEROSCOPY Right 08/20/2015   Procedure: URETEROSCOPY;  Surgeon: [name], MD;  Location: [facility];  Service: Urology;  Laterality: Right;   URETEROSCOPY Right 09/12/2015   Procedure: URETEROSCOPY;  Surgeon: [name], MD;  Location: [facility];  Service: Urology;  Laterality: Right;   URETEROSCOPY Right 02/26/2016   Procedure: URETEROSCOPY;  Surgeon: [name], MD;  Location: [facility];  Service: Urology;  Laterality: Right;    Social History   Socioeconomic History   Marital status: Married    Spouse name: Not on file   Number of children: Not on file   Years of education: Not on file   Highest education level: Not on file  Occupational History   Not on file  Tobacco Use   Smoking status: Never   Smokeless tobacco: Never   Vaping Use   Vaping Use: Never used  Substance and Sexual Activity   Alcohol use: No    Alcohol/week: 0.0 standard drinks of alcohol   Drug use: No   Sexual activity: Not Currently  Other Topics Concern   Not on file  Social History Narrative   Not on file   Social Determinants of Health   Financial Resource Strain: Not on file  Food Insecurity: Not on file  Transportation Needs: Not on file  Physical Activity: Not on file  Stress: Not on file  Social Connections: Not on file  Intimate Partner Violence: Not on file    Family History  Problem Relation Age of Onset   Hypertension Mother    Hypertension Father    Prostate cancer Brother    Kidney disease Neg Hx    Kidney cancer Neg Hx    Bladder Cancer Neg Hx      Current Outpatient Medications:    acetaminophen (TYLENOL) 500 MG tablet, Take 500 mg by mouth every 6 (six) hours as needed., Disp: , Rfl:    amLODipine (NORVASC) 5 MG tablet, ,  Disp: , Rfl:    aspirin EC 325 MG tablet, Take 325 mg by mouth daily., Disp: , Rfl:    camphor-menthol (SARNA) lotion, Apply 1 application topically as needed for itching., Disp: , Rfl:    diazepam (VALIUM) 5 MG tablet, TAKE 1 TABLET (5 MG TOTAL) BY MOUTH EVERY 12 (TWELVE) HOURS AS NEEDED FOR ANXIETY OR SLEEP, Disp: , Rfl:    docusate sodium (COLACE) 100 MG capsule, Take 100 mg by mouth 2 (two) times daily., Disp: , Rfl:    fenofibrate micronized (LOFIBRA) 134 MG capsule, Take 134 mg by mouth daily before breakfast. , Disp: , Rfl:    gemfibrozil (LOPID) 600 MG tablet, Take 600 mg by mouth daily., Disp: , Rfl:    hydrOXYzine (ATARAX) 10 MG tablet, Take 1 tablet (10 mg total) by mouth 3 (three) times daily as needed for itching., Disp: 120 tablet, Rfl: 0   imipramine (TOFRANIL) 10 MG tablet, Take 10 mg by mouth at bedtime., Disp: , Rfl:    lidocaine-prilocaine (EMLA) cream, Apply to affected area once, Disp: 30 g, Rfl: 3   methotrexate (RHEUMATREX) 2.5 MG tablet, Take by mouth., Disp: ,  Rfl:    metoprolol succinate (TOPROL-XL) 50 MG 24 hr tablet, Take 50 mg by mouth daily., Disp: , Rfl:    Multiple Vitamin (MULTIVITAMIN WITH MINERALS) TABS tablet, Take 1 tablet by mouth daily., Disp: , Rfl:    nystatin (MYCOSTATIN/NYSTOP) powder, Apply topically 2 (two) times daily., Disp: , Rfl:    ondansetron (ZOFRAN) 8 MG tablet, Take 1 tablet (8 mg total) by mouth every 8 (eight) hours as needed for nausea or vomiting., Disp: 30 tablet, Rfl: 1   polyethylene glycol (MIRALAX / GLYCOLAX) 17 g packet, Take 17 g by mouth daily., Disp: , Rfl:    psyllium (METAMUCIL) 58.6 % packet, Take 1 packet by mouth daily., Disp: , Rfl:    senna (SENOKOT) 8.6 MG tablet, Take 2 tablets by mouth daily., Disp: , Rfl:    tamsulosin (FLOMAX) 0.4 MG CAPS capsule, TAKE 1 CAPSULE BY MOUTH EVERY DAY, Disp: 90 capsule, Rfl: 3   triamcinolone cream (KENALOG) 0.1 %, Apply 1 application. topically 2 (two) times daily., Disp: , Rfl:    loperamide (IMODIUM A-D) 2 MG tablet, Take by mouth. (Patient not taking: Reported on 03/12/2022), Disp: , Rfl:    pantoprazole (PROTONIX) 20 MG tablet, Take 1 tablet (20 mg total) by mouth daily for 14 days., Disp: 14 tablet, Rfl: 0   prochlorperazine (COMPAZINE) 10 MG tablet, Take 1 tablet (10 mg total) by mouth every 6 (six) hours as needed for nausea or vomiting. (Patient not taking: Reported on 07/01/2021), Disp: 30 tablet, Rfl: 0 No current facility-administered medications for this visit.  Facility-Administered Medications Ordered in Other Visits:    heparin lock flush 100 UNIT/ML injection, , , ,    heparin lock flush 100 UNIT/ML injection, , , ,    heparin lock flush 100 UNIT/ML injection, , , ,    heparin lock flush 100 unit/mL, 500 Units, Intravenous, Once, Sindy Guadeloupe, MD   sodium chloride flush (NS) 0.9 % injection 10 mL, 10 mL, Intravenous, Once, Sindy Guadeloupe, MD   sodium chloride flush (NS) 0.9 % injection 10 mL, 10 mL, Intravenous, PRN, Sindy Guadeloupe, MD   sodium  chloride flush (NS) 0.9 % injection 10 mL, 10 mL, Intravenous, PRN, Sindy Guadeloupe, MD, 10 mL at 08/21/21 0841  Physical exam:  Vitals:   04/02/22 1402 04/02/22 1404  BP:  (!) 165/75  Pulse:  (!) 51  Resp:  18  SpO2:  96%  Weight: 194 lb (88 kg)    Physical Exam Cardiovascular:     Rate and Rhythm: Normal rate and regular rhythm.     Heart sounds: Normal heart sounds.  Pulmonary:     Effort: Pulmonary effort is normal.     Breath sounds: Normal breath sounds.  Abdominal:     General: Bowel sounds are normal.     Palpations: Abdomen is soft.  Skin:    General: Skin is warm and dry.     Comments: 2 small areas of dime sized lesions noted 1 over the left arm and the other on the medial aspect of the right thigh  Neurological:     Mental Status: He is alert and oriented to person, place, and time.         Latest Ref Rng & Units 04/02/2022    1:49 PM  CMP  Glucose 70 - 99 mg/dL 125   BUN 8 - 23 mg/dL 18   Creatinine 0.61 - 1.24 mg/dL 0.84   Sodium 135 - 145 mmol/L 135   Potassium 3.5 - 5.1 mmol/L 4.0   Chloride 98 - 111 mmol/L 101   CO2 22 - 32 mmol/L 26   Calcium 8.9 - 10.3 mg/dL 8.7   Total Protein 6.5 - 8.1 g/dL 7.0   Total Bilirubin 0.3 - 1.2 mg/dL 0.4   Alkaline Phos 38 - 126 U/L 81   AST 15 - 41 U/L 35   ALT 0 - 44 U/L 22       Latest Ref Rng & Units 04/02/2022    1:49 PM  CBC  WBC 4.0 - 10.5 K/uL 8.3   Hemoglobin 13.0 - 17.0 g/dL 14.7   Hematocrit 39.0 - 52.0 % 44.5   Platelets 150 - 400 K/uL 267     No images are attached to the encounter.  CT CHEST ABDOMEN PELVIS W CONTRAST  Result Date: 03/15/2022 CLINICAL DATA:  Right ureteral cancer. EXAM: CT CHEST, ABDOMEN, AND PELVIS WITH CONTRAST TECHNIQUE: Multidetector CT imaging of the chest, abdomen and pelvis was performed following the standard protocol during bolus administration of intravenous contrast. RADIATION DOSE REDUCTION: This exam was performed according to the departmental dose-optimization program  which includes automated exposure control, adjustment of the mA and/or kV according to patient size and/or use of iterative reconstruction technique. CONTRAST:  34mL OMNIPAQUE IOHEXOL 300 MG/ML  SOLN COMPARISON:  CT chest abdomen and pelvis 12/23/2021 FINDINGS: CT CHEST FINDINGS Cardiovascular: No significant vascular findings. Normal heart size. No pericardial effusion. There are atherosclerotic calcifications of the aorta and coronary arteries. Right chest port catheter tip ends in the SVC. Mediastinum/Nodes: No enlarged mediastinal, hilar, or axillary lymph nodes. Thyroid gland, trachea, and esophagus demonstrate no significant findings. Lungs/Pleura: There are new patchy tree-in-bud opacities in ground-glass opacities in the right upper and minimally in the left lower lobe lobe compatible with infectious/inflammatory process. Right upper lobe nodule measuring 5 mm image 3/42 has increased in size. Right upper lobe nodule measuring up to 13 mm image 3/83 is unchanged in size. There is a new 10 mm nodule in the right lower lobe image 3/88. 12 mm left lower lobe nodule image 3/96 is unchanged in size. 2 mm nodule in the left lower lobe image 3/91 is unchanged in size. There is no pleural effusion or pneumothorax identified. Musculoskeletal: No chest wall mass or suspicious bone lesions identified. CT ABDOMEN  PELVIS FINDINGS Hepatobiliary: Gallstones are present. No biliary ductal dilatation. No focal liver lesions. Pancreas: 10 mm hypodense area in the uncinate process appears unchanged. No pancreatic ductal dilatation or surrounding inflammation. Spleen: Normal in size without focal abnormality. Adrenals/Urinary Tract: Small left bladder diverticulum appears unchanged from prior. There is no hydronephrosis or perinephric fluid. No new renal mass identified. There is a 4.1 cm cyst in the right kidney which is unchanged. The adrenal glands are within normal limits. Stomach/Bowel: Stomach is within normal limits.  Appendix appears normal. No evidence of bowel wall thickening, distention, or inflammatory changes. There is sigmoid colon diverticulosis. Vascular/Lymphatic: Aortic atherosclerosis. There is an enlarged lymph node along the right pelvic sidewall measuring 12 mm short axis, unchanged from prior. No new enlarged lymph nodes are seen. Reproductive: Uterus and bilateral adnexa are unremarkable. Other: No abdominal wall hernia or ascites. Musculoskeletal: No focal osseous lesion or acute fracture. Degenerative changes affect the spine and hips. IMPRESSION: 1. New patchy tree-in-bud and ground-glass opacities in the right upper lobe and minimally in the left lower lobe compatible with infectious/inflammatory process. 2. New 10 mm nodule in the right lower lobe. Enlarging right upper lobe pulmonary nodule. Findings are worrisome for worsening metastatic disease. Other pulmonary nodules are stable. 3. Stable enlarged right pelvic sidewall lymph node. 4. Cholelithiasis. 5. Stable 10 mm hypodense lesion in the uncinate process of the pancreas. 6. Stable left bladder diverticulum. 7. Right Bosniak I benign renal cyst measuring 4.1 cm. No follow-up imaging is recommended. JACR 2018 Feb; 264-273, Management of the Incidental Renal Mass on CT, [employer] 2021; 814-848, Bosniak Classification of Cystic Renal Masses, Version 2019. Aortic Atherosclerosis (ICD10-I70.0). Electronically Signed   By: Ronney Asters M.D.   On: 03/15/2022 16:24     Assessment and plan- Patient is a 89 y.o. male  with metastatic urothelial cancer with lung and lymph node metastases.  He is here for on treatment assessment prior to cycle 5 of palliative Keytruda  Counts okay to proceed with cycle 5 of palliative Keytruda today and I will see him back in 3 weeks for cycle 6.  He has 2 small areas of erythematous skin lesion noted but no other areas.  He has topical steroid should his rash worsen.  He is also seeing Dr. Evorn Gong next week.  I have  reviewed CT chest abdomen and pelvis images independently and discussed findings with the patient.Lung nodules are mostly stable except for a new 10 mm right lower lobe nodule and another right upper lobe nodule which was increased by a couple of millimeters.  Pelvic side wall lymph node stable.  Given overall low disease burden and no significant disease progression my plan is to continue Keytruda at this time with a repeat scan after 6 to 8 weeks.  If there is continued progression we will have to discuss other treatment options   Visit Diagnosis 1. 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 Goals of care, counseling/discussion   3. Encounter for antineoplastic immunotherapy      Dr. Randa Evens, MD, MPH [facility] [phone] 04/03/2022 2:04 PM

## 2022-04-08 DIAGNOSIS — R0602 Shortness of breath: Secondary | ICD-10-CM | POA: Diagnosis not present

## 2022-04-10 DIAGNOSIS — D225 Melanocytic nevi of trunk: Secondary | ICD-10-CM | POA: Diagnosis not present

## 2022-04-10 DIAGNOSIS — D044 Carcinoma in situ of skin of scalp and neck: Secondary | ICD-10-CM | POA: Diagnosis not present

## 2022-04-10 DIAGNOSIS — D2261 Melanocytic nevi of right upper limb, including shoulder: Secondary | ICD-10-CM | POA: Diagnosis not present

## 2022-04-10 DIAGNOSIS — D0462 Carcinoma in situ of skin of left upper limb, including shoulder: Secondary | ICD-10-CM | POA: Diagnosis not present

## 2022-04-10 DIAGNOSIS — L570 Actinic keratosis: Secondary | ICD-10-CM | POA: Diagnosis not present

## 2022-04-10 DIAGNOSIS — L820 Inflamed seborrheic keratosis: Secondary | ICD-10-CM | POA: Diagnosis not present

## 2022-04-10 DIAGNOSIS — D485 Neoplasm of uncertain behavior of skin: Secondary | ICD-10-CM | POA: Diagnosis not present

## 2022-04-10 DIAGNOSIS — L408 Other psoriasis: Secondary | ICD-10-CM | POA: Diagnosis not present

## 2022-04-10 DIAGNOSIS — Z85828 Personal history of other malignant neoplasm of skin: Secondary | ICD-10-CM | POA: Diagnosis not present

## 2022-04-10 DIAGNOSIS — X32XXXA Exposure to sunlight, initial encounter: Secondary | ICD-10-CM | POA: Diagnosis not present

## 2022-04-10 DIAGNOSIS — D2262 Melanocytic nevi of left upper limb, including shoulder: Secondary | ICD-10-CM | POA: Diagnosis not present

## 2022-04-23 ENCOUNTER — Inpatient Hospital Stay (HOSPITAL_BASED_OUTPATIENT_CLINIC_OR_DEPARTMENT_OTHER): Payer: PPO | Admitting: Oncology

## 2022-04-23 ENCOUNTER — Encounter: Payer: Self-pay | Admitting: Oncology

## 2022-04-23 ENCOUNTER — Inpatient Hospital Stay: Payer: PPO | Attending: Oncology

## 2022-04-23 ENCOUNTER — Inpatient Hospital Stay: Payer: PPO

## 2022-04-23 VITALS — BP 177/71 | HR 57 | Temp 97.6°F | Resp 16 | Wt 193.0 lb

## 2022-04-23 DIAGNOSIS — C7802 Secondary malignant neoplasm of left lung: Secondary | ICD-10-CM | POA: Insufficient documentation

## 2022-04-23 DIAGNOSIS — Z79620 Long term (current) use of immunosuppressive biologic: Secondary | ICD-10-CM | POA: Diagnosis not present

## 2022-04-23 DIAGNOSIS — Z882 Allergy status to sulfonamides status: Secondary | ICD-10-CM | POA: Diagnosis not present

## 2022-04-23 DIAGNOSIS — K8020 Calculus of gallbladder without cholecystitis without obstruction: Secondary | ICD-10-CM | POA: Diagnosis not present

## 2022-04-23 DIAGNOSIS — R5383 Other fatigue: Secondary | ICD-10-CM | POA: Diagnosis not present

## 2022-04-23 DIAGNOSIS — Z79899 Other long term (current) drug therapy: Secondary | ICD-10-CM | POA: Insufficient documentation

## 2022-04-23 DIAGNOSIS — Z8249 Family history of ischemic heart disease and other diseases of the circulatory system: Secondary | ICD-10-CM | POA: Insufficient documentation

## 2022-04-23 DIAGNOSIS — C669 Malignant neoplasm of unspecified ureter: Secondary | ICD-10-CM

## 2022-04-23 DIAGNOSIS — N400 Enlarged prostate without lower urinary tract symptoms: Secondary | ICD-10-CM | POA: Diagnosis not present

## 2022-04-23 DIAGNOSIS — Z87440 Personal history of urinary (tract) infections: Secondary | ICD-10-CM | POA: Diagnosis not present

## 2022-04-23 DIAGNOSIS — C778 Secondary and unspecified malignant neoplasm of lymph nodes of multiple regions: Secondary | ICD-10-CM | POA: Diagnosis not present

## 2022-04-23 DIAGNOSIS — Z87442 Personal history of urinary calculi: Secondary | ICD-10-CM | POA: Insufficient documentation

## 2022-04-23 DIAGNOSIS — Z8042 Family history of malignant neoplasm of prostate: Secondary | ICD-10-CM | POA: Diagnosis not present

## 2022-04-23 DIAGNOSIS — Z85828 Personal history of other malignant neoplasm of skin: Secondary | ICD-10-CM | POA: Diagnosis not present

## 2022-04-23 DIAGNOSIS — I2510 Atherosclerotic heart disease of native coronary artery without angina pectoris: Secondary | ICD-10-CM | POA: Diagnosis not present

## 2022-04-23 DIAGNOSIS — I10 Essential (primary) hypertension: Secondary | ICD-10-CM | POA: Diagnosis not present

## 2022-04-23 DIAGNOSIS — Z885 Allergy status to narcotic agent status: Secondary | ICD-10-CM | POA: Insufficient documentation

## 2022-04-23 DIAGNOSIS — I700 Atherosclerosis of aorta: Secondary | ICD-10-CM | POA: Diagnosis not present

## 2022-04-23 DIAGNOSIS — Z5112 Encounter for antineoplastic immunotherapy: Secondary | ICD-10-CM

## 2022-04-23 DIAGNOSIS — C661 Malignant neoplasm of right ureter: Secondary | ICD-10-CM | POA: Diagnosis not present

## 2022-04-23 DIAGNOSIS — L309 Dermatitis, unspecified: Secondary | ICD-10-CM | POA: Insufficient documentation

## 2022-04-23 LAB — CBC WITH DIFFERENTIAL/PLATELET
Abs Immature Granulocytes: 0.06 10*3/uL (ref 0.00–0.07)
Basophils Absolute: 0.1 10*3/uL (ref 0.0–0.1)
Basophils Relative: 1 %
Eosinophils Absolute: 0.2 10*3/uL (ref 0.0–0.5)
Eosinophils Relative: 3 %
HCT: 43.6 % (ref 39.0–52.0)
Hemoglobin: 14.7 g/dL (ref 13.0–17.0)
Immature Granulocytes: 1 %
Lymphocytes Relative: 27 %
Lymphs Abs: 2.5 10*3/uL (ref 0.7–4.0)
MCH: 29.2 pg (ref 26.0–34.0)
MCHC: 33.7 g/dL (ref 30.0–36.0)
MCV: 86.5 fL (ref 80.0–100.0)
Monocytes Absolute: 0.9 10*3/uL (ref 0.1–1.0)
Monocytes Relative: 10 %
Neutro Abs: 5.4 10*3/uL (ref 1.7–7.7)
Neutrophils Relative %: 58 %
Platelets: 252 10*3/uL (ref 150–400)
RBC: 5.04 MIL/uL (ref 4.22–5.81)
RDW: 12.4 % (ref 11.5–15.5)
WBC: 9.1 10*3/uL (ref 4.0–10.5)
nRBC: 0 % (ref 0.0–0.2)

## 2022-04-23 LAB — COMPREHENSIVE METABOLIC PANEL
ALT: 21 U/L (ref 0–44)
AST: 29 U/L (ref 15–41)
Albumin: 3.7 g/dL (ref 3.5–5.0)
Alkaline Phosphatase: 68 U/L (ref 38–126)
Anion gap: 9 (ref 5–15)
BUN: 19 mg/dL (ref 8–23)
CO2: 25 mmol/L (ref 22–32)
Calcium: 8.6 mg/dL — ABNORMAL LOW (ref 8.9–10.3)
Chloride: 101 mmol/L (ref 98–111)
Creatinine, Ser: 0.83 mg/dL (ref 0.61–1.24)
GFR, Estimated: >60 mL/min (ref >60)
Glucose, Bld: 99 mg/dL (ref 70–99)
Potassium: 4.1 mmol/L (ref 3.5–5.1)
Sodium: 135 mmol/L (ref 135–145)
Total Bilirubin: 0.4 mg/dL (ref 0.3–1.2)
Total Protein: 6.7 g/dL (ref 6.5–8.1)

## 2022-04-23 LAB — TSH: TSH: 1.553 u[IU]/mL (ref 0.350–4.500)

## 2022-04-23 MED ORDER — SODIUM CHLORIDE 0.9 % IV SOLN
200.0000 mg | Freq: Once | INTRAVENOUS | Status: AC
  Administered 2022-04-23: 200 mg via INTRAVENOUS
  Filled 2022-04-23: qty 8

## 2022-04-23 MED ORDER — SODIUM CHLORIDE 0.9 % IV SOLN
Freq: Once | INTRAVENOUS | Status: AC
  Filled 2022-04-23: qty 250

## 2022-04-23 MED ORDER — SODIUM CHLORIDE 0.9% FLUSH
10.0000 mL | INTRAVENOUS | Status: DC | PRN
  Filled 2022-04-23: qty 10

## 2022-04-23 MED ORDER — HEPARIN SOD (PORK) LOCK FLUSH 100 UNIT/ML IV SOLN
500.0000 [IU] | Freq: Once | INTRAVENOUS | Status: AC | PRN
  Administered 2022-04-23: 500 [IU]
  Filled 2022-04-23: qty 5

## 2022-04-23 NOTE — Patient Instructions (Signed)
[facility]  Discharge Instructions: Thank you for choosing [facility] to provide your oncology and hematology care.  If you have a lab appointment with the [facility], please go directly to the [facility] and check in at the registration area.  Wear comfortable clothing and clothing appropriate for easy access to any Portacath or PICC line.   We strive to give you quality time with your provider. You may need to reschedule your appointment if you arrive late (15 or more minutes).  Arriving late affects you and other patients whose appointments are after yours.  Also, if you miss three or more appointments without notifying the office, you may be dismissed from the clinic at the provider's discretion.      For prescription refill requests, have your pharmacy contact our office and allow 72 hours for refills to be completed.    Today you received the following chemotherapy and/or immunotherapy agents- Keytruda      To help prevent nausea and vomiting after your treatment, we encourage you to take your nausea medication as directed.  BELOW ARE SYMPTOMS THAT SHOULD BE REPORTED IMMEDIATELY: *FEVER GREATER THAN 100.4 F (38 C) OR HIGHER *CHILLS OR SWEATING *NAUSEA AND VOMITING THAT IS NOT CONTROLLED WITH YOUR NAUSEA MEDICATION *UNUSUAL SHORTNESS OF BREATH *UNUSUAL BRUISING OR BLEEDING *URINARY PROBLEMS (pain or burning when urinating, or frequent urination) *BOWEL PROBLEMS (unusual diarrhea, constipation, pain near the anus) TENDERNESS IN MOUTH AND THROAT WITH OR WITHOUT PRESENCE OF ULCERS (sore throat, sores in mouth, or a toothache) UNUSUAL RASH, SWELLING OR PAIN  UNUSUAL VAGINAL DISCHARGE OR ITCHING   Items with * indicate a potential emergency and should be followed up as soon as possible or go to the Emergency Department if any problems should occur.  Please show the CHEMOTHERAPY ALERT CARD or IMMUNOTHERAPY ALERT CARD at check-in to  the Emergency Department and triage nurse.  Should you have questions after your visit or need to cancel or reschedule your appointment, please contact [facility]  [phone] and follow the prompts.  Office hours are 8:00 a.m. to 4:30 p.m. Monday - Friday. Please note that voicemails left after 4:00 p.m. may not be returned until the following business day.  We are closed weekends and major holidays. You have access to a nurse at all times for urgent questions. Please call the main number to the clinic [phone] and follow the prompts.  For any non-urgent questions, you may also contact your provider using MyChart. We now offer e-Visits for anyone 43 and older to request care online for non-urgent symptoms. For details visit [email or web address].   Also download the MyChart app! Go to the app store, search "MyChart", open the app, select [facility], and log in with your MyChart username and password.

## 2022-04-23 NOTE — Progress Notes (Unsigned)
Pt in for follow up, denies any concerns today. 

## 2022-04-24 ENCOUNTER — Encounter: Payer: Self-pay | Admitting: Oncology

## 2022-04-24 LAB — T4: T4, Total: 6.3 ug/dL (ref 4.5–12.0)

## 2022-04-24 NOTE — Progress Notes (Signed)
Hematology/Oncology Consult note [facility]  Telephone:([phone] [phone]  Patient Care Team: Idelle Crouch, MD as PCP - General (Internal Medicine) Sindy Guadeloupe, MD as Consulting Physician (Hematology and Oncology) Sindy Guadeloupe, MD as Consulting Physician (Hematology and Oncology) Sindy Guadeloupe, MD as Consulting Physician (Hematology and Oncology) Dasher, Rayvon Char, MD as Consulting Physician (Dermatology)   Name of the patient: Anthony Gonzales  [id number]  1931-04-10   Date of visit: 04/24/22  Diagnosis- Metastatic urothelial carcinoma with possible mets to the obturator node. Indeterminate hilar lymph nodes and LUL lung nodule    Chief complaint/ Reason for visit-on treatment assessment prior to next cycle of Keytruda  Heme/Onc history: patient is a 89 year old male with past medical history significant for hypertension and long-standing history of superficial bladder cancer for which he sees [name].  He has undergone TURBT as well as ureteroscopy since 2017 along with mitomycin as well in the past.  Most recently he underwent CT abdomen on 08/06/2017 which showed a soft tissue mass in the right renal pelvis concerning for upper tract urothelial neoplasm.  Soft tissue fullness at the right ureterovesical junction with proximal right hydroureteronephrosis.  Several millimeter attenuation lesion in the pancreatic head.   He underwent diagnostic ureteroscopy and was found to have 2 high-grade lesions within his right kidney.  There was a nodular high-grade appearing lesion in the right anterior renal pelvis.  There was also a second ureteral tumor fungating from the right ureteral orifice extending into the distal ureter also consistent with high-grade invasive urothelial carcinoma.  Muscle invasion could not be assessed.  He also underwent a CT chest which showed a rounded nodule in the right upper lobe measuring 14 mm concerning for  metastases.  2 other 3 mm lesions were also noted in the left upper lobe likely benign   Plan initially was neoadjuvant chemotherapy followed by possible surgery but given the presence of lung lesion patient has been referred to oncology for the same.   PET/CT on 09/21/17 showed: IMPRESSION: 1. Hypermetabolic right obturator lymph node is most indicative of metastatic disease. 2. Mildly hypermetabolic left hilar lymph nodes are nonspecific. Continued attention on follow-up exams is warranted. 3. Right upper lobe pulmonary nodule shows metabolism after just above blood pool and is therefore indeterminate. Continued attention on follow-up exams is warranted. 4. Aortic atherosclerosis (ICD10-170.0). Coronary artery calcification. 5. Cholelithiasis    Carboplatin/ gemzar 1 week on and one-week off as patient could not tolerate 2-week on and one week off regimen.  Cycle 1 started on 09/22/2017 Disease progression in March 2020.  Switched to second [employer]   Patient had disease controlled with Keytruda.  However he was noted to have worsening dermatitis with constant flareups requiring steroids.  Beryle Flock is therefore being kept on hold and patient will be switched to third line Padcev.    Treatment has been on hold since February 2021 due to constant flareup of skin rash   Scans in October 2022 showed disease progression with new lung nodules as well as pelvic sidewall adenopathy.  Padcev restarted in November 2022.  Skin rash and blurry vision secondary to Padcev which was put on hold starting May 2023   Progressive disease based on scans in October 2023.  Plan is to rechallenge patient with Keytruda      Interval history-does not have any significant rash from [facility] so far.  He does not report any other side effects from [facility].  He has had a gradual decrease in his vision and finds it difficult to read novels like before.  He is following up with ophthalmology as well.  ECOG PS-  1 Pain scale- 0   Review of systems- Review of Systems  Constitutional:  Positive for malaise/fatigue. Negative for chills, fever and weight loss.  HENT:  Negative for congestion, ear discharge and nosebleeds.   Eyes:  Negative for blurred vision.  Respiratory:  Negative for cough, hemoptysis, sputum production, shortness of breath and wheezing.   Cardiovascular:  Negative for chest pain, palpitations, orthopnea and claudication.  Gastrointestinal:  Negative for abdominal pain, blood in stool, constipation, diarrhea, heartburn, melena, nausea and vomiting.  Genitourinary:  Negative for dysuria, flank pain, frequency, hematuria and urgency.  Musculoskeletal:  Negative for back pain, joint pain and myalgias.  Skin:  Negative for rash.  Neurological:  Negative for dizziness, tingling, focal weakness, seizures, weakness and headaches.  Endo/Heme/Allergies:  Does not bruise/bleed easily.  Psychiatric/Behavioral:  Negative for depression and suicidal ideas. The patient does not have insomnia.       Allergies  Allergen Reactions   Demerol [Meperidine] Nausea And Vomiting   Lipitor [Atorvastatin] Swelling   Sulfa Antibiotics Nausea And Vomiting and Rash     Past Medical History:  Diagnosis Date   Arthritis    Benign fibroma of prostate 08/23/2013   BPH (benign prostatic hyperplasia)    Calculus of kidney 08/23/2013   Glaucoma    no drops in 3 mo pressure good, pt denies glaucoma, eye pressure has been measuring alright.   History of kidney stones    HLD (hyperlipidemia)    HOH (hard of hearing)    Left Hearing Aid   HTN (hypertension) 12/26/2014   Hypertension    Hyponatremia 12/26/2014   Migraines    history of migraines when he was younger.   Restless leg syndrome    Sinus drainage    Skin cancer    Skin cancer    left hand 06/2019   Urothelial cancer ([place])    chemo tx's.   UTI (lower urinary tract infection) 12/26/2014   Vertigo      Past Surgical History:  Procedure  Laterality Date   COLONOSCOPY     CYSTOSCOPY W/ RETROGRADES Right 01/30/2015   Procedure: CYSTOSCOPY WITH RETROGRADE PYELOGRAM;  Surgeon: [name], MD;  Location: [facility];  Service: Urology;  Laterality: Right;   CYSTOSCOPY W/ RETROGRADES Bilateral 02/26/2016   Procedure: CYSTOSCOPY WITH RETROGRADE PYELOGRAM;  Surgeon: [name], MD;  Location: [facility];  Service: Urology;  Laterality: Bilateral;   CYSTOSCOPY W/ URETERAL STENT PLACEMENT Right 08/20/2015   Procedure: CYSTOSCOPY WITH RETROGRADE PYELOGRAM/POSSIBLE URETERAL STENT PLACEMENT/BLADDER BIOPSY;  Surgeon: [name], MD;  Location: [facility];  Service: Urology;  Laterality: Right;   CYSTOSCOPY W/ URETERAL STENT PLACEMENT Right 09/12/2015   Procedure: CYSTOSCOPY WITH STENT REPLACEMENT;  Surgeon: [name], MD;  Location: [facility];  Service: Urology;  Laterality: Right;   CYSTOSCOPY WITH BIOPSY Right 09/12/2015   Procedure: CYSTOSCOPY WITH BLADDER AND URETERAL BIOPSY;  Surgeon: [name], MD;  Location: [facility];  Service: Urology;  Laterality: Right;   CYSTOSCOPY WITH STENT PLACEMENT Right 01/30/2015   Procedure: CYSTOSCOPY WITH STENT PLACEMENT;  Surgeon: [name], MD;  Location: [facility];  Service: Urology;  Laterality: Right;   CYSTOSCOPY WITH STENT PLACEMENT Right 12/28/2017   Procedure: CYSTOSCOPY WITH STENT Exchange;  Surgeon: [name], MD;  Location: [facility];  Service: Urology;  Laterality: Right;  CYSTOSCOPY/URETEROSCOPY/HOLMIUM LASER/STENT PLACEMENT Right 08/19/2017   Procedure: CYSTOSCOPY/URETEROSCOPY/HOLMIUM LASER/STENT PLACEMENT;  Surgeon: [name], MD;  Location: [facility];  Service: Urology;  Laterality: Right;   EYE SURGERY Bilateral    Cataract Extraction with IOL   goiter removal     HOLMIUM LASER APPLICATION N/A 11/01/537   Procedure:  HOLMIUM LASER APPLICATION;  Surgeon: [name], MD;  Location: [facility];  Service: Urology;  Laterality: N/A;   PORTA CATH INSERTION N/A  09/23/2017   Procedure: PORTA CATH INSERTION;  Surgeon: Algernon Huxley, MD;  Location: [facility];  Service: Cardiovascular;  Laterality: N/A;   SPERMATOCELECTOMY     TONSILLECTOMY     TRANSURETHRAL RESECTION OF BLADDER TUMOR N/A 02/26/2016   Procedure: TRANSURETHRAL RESECTION OF BLADDER TUMOR (TURBT);  Surgeon: [name], MD;  Location: [facility];  Service: Urology;  Laterality: N/A;   TRANSURETHRAL RESECTION OF BLADDER TUMOR WITH MITOMYCIN-C N/A 09/12/2015   Procedure: TRANSURETHRAL RESECTION OF BLADDER TUMOR ;  Surgeon: [name], MD;  Location: [facility];  Service: Urology;  Laterality: N/A;   TRANSURETHRAL RESECTION OF BLADDER TUMOR WITH MITOMYCIN-C N/A 03/24/2016   Procedure: TRANSURETHRAL RESECTION OF BLADDER TUMOR WITH MITOMYCIN-C  (SMALL);  Surgeon: [name], MD;  Location: [facility];  Service: Urology;  Laterality: N/A;   URETERAL BIOPSY Right 08/19/2017   Procedure: Renal Mass BIOPSY;  Surgeon: [name], MD;  Location: [facility];  Service: Urology;  Laterality: Right;   URETEROSCOPY Right 01/30/2015   Procedure: URETEROSCOPY/ WITH BIOPSY AND CYTOLOGY BRUSHING;  Surgeon: [name], MD;  Location: [facility];  Service: Urology;  Laterality: Right;   URETEROSCOPY Right 08/20/2015   Procedure: URETEROSCOPY;  Surgeon: [name], MD;  Location: [facility];  Service: Urology;  Laterality: Right;   URETEROSCOPY Right 09/12/2015   Procedure: URETEROSCOPY;  Surgeon: [name], MD;  Location: [facility];  Service: Urology;  Laterality: Right;   URETEROSCOPY Right 02/26/2016   Procedure: URETEROSCOPY;  Surgeon: [name], MD;  Location: [facility];  Service: Urology;  Laterality: Right;    Social History   Socioeconomic History   Marital status: Married    Spouse name: Not on file   Number of children: Not on file   Years of education: Not on file   Highest education level: Not on file  Occupational History   Not on file  Tobacco Use   Smoking status:  Never   Smokeless tobacco: Never  Vaping Use   Vaping Use: Never used  Substance and Sexual Activity   Alcohol use: No    Alcohol/week: 0.0 standard drinks of alcohol   Drug use: No   Sexual activity: Not Currently  Other Topics Concern   Not on file  Social History Narrative   Not on file   Social Determinants of Health   Financial Resource Strain: Not on file  Food Insecurity: Not on file  Transportation Needs: Not on file  Physical Activity: Not on file  Stress: Not on file  Social Connections: Not on file  Intimate Partner Violence: Not on file    Family History  Problem Relation Age of Onset   Hypertension Mother    Hypertension Father    Prostate cancer Brother    Kidney disease Neg Hx    Kidney cancer Neg Hx    Bladder Cancer Neg Hx      Current Outpatient Medications:    acetaminophen (TYLENOL) 500 MG tablet, Take 500 mg by mouth every 6 (six) hours as needed., Disp: ,  Rfl:    amLODipine (NORVASC) 5 MG tablet, , Disp: , Rfl:    aspirin EC 325 MG tablet, Take 325 mg by mouth daily., Disp: , Rfl:    camphor-menthol (SARNA) lotion, Apply 1 application topically as needed for itching., Disp: , Rfl:    diazepam (VALIUM) 5 MG tablet, TAKE 1 TABLET (5 MG TOTAL) BY MOUTH EVERY 12 (TWELVE) HOURS AS NEEDED FOR ANXIETY OR SLEEP, Disp: , Rfl:    docusate sodium (COLACE) 100 MG capsule, Take 100 mg by mouth 2 (two) times daily., Disp: , Rfl:    fenofibrate micronized (LOFIBRA) 134 MG capsule, Take 134 mg by mouth daily before breakfast. , Disp: , Rfl:    gemfibrozil (LOPID) 600 MG tablet, Take 600 mg by mouth daily., Disp: , Rfl:    hydrOXYzine (ATARAX) 10 MG tablet, Take 1 tablet (10 mg total) by mouth 3 (three) times daily as needed for itching., Disp: 120 tablet, Rfl: 0   imipramine (TOFRANIL) 10 MG tablet, Take 10 mg by mouth at bedtime., Disp: , Rfl:    lidocaine-prilocaine (EMLA) cream, Apply to affected area once, Disp: 30 g, Rfl: 3   methotrexate (RHEUMATREX) 2.5 MG  tablet, Take by mouth., Disp: , Rfl:    metoprolol succinate (TOPROL-XL) 50 MG 24 hr tablet, Take 50 mg by mouth daily., Disp: , Rfl:    Multiple Vitamin (MULTIVITAMIN WITH MINERALS) TABS tablet, Take 1 tablet by mouth daily., Disp: , Rfl:    nystatin (MYCOSTATIN/NYSTOP) powder, Apply topically 2 (two) times daily., Disp: , Rfl:    ondansetron (ZOFRAN) 8 MG tablet, Take 1 tablet (8 mg total) by mouth every 8 (eight) hours as needed for nausea or vomiting., Disp: 30 tablet, Rfl: 1   pantoprazole (PROTONIX) 20 MG tablet, Take 1 tablet (20 mg total) by mouth daily for 14 days., Disp: 14 tablet, Rfl: 0   psyllium (METAMUCIL) 58.6 % packet, Take 1 packet by mouth daily., Disp: , Rfl:    senna (SENOKOT) 8.6 MG tablet, Take 2 tablets by mouth daily., Disp: , Rfl:    tamsulosin (FLOMAX) 0.4 MG CAPS capsule, TAKE 1 CAPSULE BY MOUTH EVERY DAY, Disp: 90 capsule, Rfl: 3   triamcinolone cream (KENALOG) 0.1 %, Apply 1 application. topically 2 (two) times daily., Disp: , Rfl:    loperamide (IMODIUM A-D) 2 MG tablet, Take by mouth. (Patient not taking: Reported on 03/12/2022), Disp: , Rfl:    polyethylene glycol (MIRALAX / GLYCOLAX) 17 g packet, Take 17 g by mouth daily. (Patient not taking: Reported on 04/23/2022), Disp: , Rfl:    prochlorperazine (COMPAZINE) 10 MG tablet, Take 1 tablet (10 mg total) by mouth every 6 (six) hours as needed for nausea or vomiting. (Patient not taking: Reported on 07/01/2021), Disp: 30 tablet, Rfl: 0 No current facility-administered medications for this visit.  Facility-Administered Medications Ordered in Other Visits:    heparin lock flush 100 UNIT/ML injection, , , ,    heparin lock flush 100 UNIT/ML injection, , , ,    heparin lock flush 100 UNIT/ML injection, , , ,    heparin lock flush 100 unit/mL, 500 Units, Intravenous, Once, Sindy Guadeloupe, MD   sodium chloride flush (NS) 0.9 % injection 10 mL, 10 mL, Intravenous, Once, Sindy Guadeloupe, MD   sodium chloride flush (NS) 0.9 %  injection 10 mL, 10 mL, Intravenous, PRN, Sindy Guadeloupe, MD   sodium chloride flush (NS) 0.9 % injection 10 mL, 10 mL, Intravenous, PRN, Sindy Guadeloupe, MD,  10 mL at 08/21/21 0841  Physical exam:  Vitals:   04/23/22 1413  BP: (!) 177/71  Pulse: (!) 57  Resp: 16  Temp: 97.6 F (36.4 C)  TempSrc: Tympanic  SpO2: 97%  Weight: 193 lb (87.5 kg)   Physical Exam Cardiovascular:     Rate and Rhythm: Normal rate and regular rhythm.     Heart sounds: Normal heart sounds.  Pulmonary:     Effort: Pulmonary effort is normal.     Breath sounds: Normal breath sounds.  Abdominal:     General: Bowel sounds are normal.     Palpations: Abdomen is soft.  Skin:    General: Skin is warm and dry.  Neurological:     Mental Status: He is alert and oriented to person, place, and time.         Latest Ref Rng & Units 04/23/2022    1:24 PM  CMP  Glucose 70 - 99 mg/dL 99   BUN 8 - 23 mg/dL 19   Creatinine 0.61 - 1.24 mg/dL 0.83   Sodium 135 - 145 mmol/L 135   Potassium 3.5 - 5.1 mmol/L 4.1   Chloride 98 - 111 mmol/L 101   CO2 22 - 32 mmol/L 25   Calcium 8.9 - 10.3 mg/dL 8.6   Total Protein 6.5 - 8.1 g/dL 6.7   Total Bilirubin 0.3 - 1.2 mg/dL 0.4   Alkaline Phos 38 - 126 U/L 68   AST 15 - 41 U/L 29   ALT 0 - 44 U/L 21       Latest Ref Rng & Units 04/23/2022    1:24 PM  CBC  WBC 4.0 - 10.5 K/uL 9.1   Hemoglobin 13.0 - 17.0 g/dL 14.7   Hematocrit 39.0 - 52.0 % 43.6   Platelets 150 - 400 K/uL 252     Assessment and plan- Patient is a 89 y.o. male with metastatic urothelial cancer with lung and lymph node metastases.  He is here for on treatment assessment prior to cycle 6 of palliative Keytruda  Counts okay to proceed with cycle 6 of Keytruda today and I will see him back in 3 weeks for cycle 7.  Plan to repeat scans after 9 cycles.  He is tolerating Keytruda well without any significant skin rash.  Overall patient is getting generally frail.  He has difficulty with his vision and  follows up with ophthalmology.  No recent falls   Visit Diagnosis 1. 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 Encounter for antineoplastic immunotherapy      Dr. Randa Evens, MD, MPH [facility] at [facility] [phone] 04/24/2022 2:18 PM

## 2022-04-25 DIAGNOSIS — H353131 Nonexudative age-related macular degeneration, bilateral, early dry stage: Secondary | ICD-10-CM | POA: Diagnosis not present

## 2022-04-25 DIAGNOSIS — H35373 Puckering of macula, bilateral: Secondary | ICD-10-CM | POA: Diagnosis not present

## 2022-04-25 DIAGNOSIS — H43813 Vitreous degeneration, bilateral: Secondary | ICD-10-CM | POA: Diagnosis not present

## 2022-04-28 ENCOUNTER — Telehealth: Payer: Self-pay | Admitting: *Deleted

## 2022-04-28 NOTE — Telephone Encounter (Signed)
Patient called stating that the eye doctor wants to put him on a vitamin and wanted patient to ask Dr Janese Banks if it is ok. When questioned, he states that he has macular degeneration and the medicine is AREDS 2. Please advise

## 2022-04-28 NOTE — Telephone Encounter (Signed)
Yes ok to proceed

## 2022-04-28 NOTE — Telephone Encounter (Signed)
Call returned to patient and informed of doctor response

## 2022-05-07 ENCOUNTER — Emergency Department
Admission: EM | Admit: 2022-05-07 | Discharge: 2022-05-07 | Disposition: A | Discharge status: Home / Self Care | Payer: PPO | Attending: Emergency Medicine | Admitting: Emergency Medicine

## 2022-05-07 ENCOUNTER — Other Ambulatory Visit: Payer: Self-pay | Admitting: Physician Assistant

## 2022-05-07 ENCOUNTER — Emergency Department
Admission: RE | Admit: 2022-05-07 | Discharge: 2022-05-07 | Disposition: A | Discharge status: Home / Self Care | Payer: PPO | Source: Ambulatory Visit | Attending: Physician Assistant | Admitting: Physician Assistant

## 2022-05-07 ENCOUNTER — Other Ambulatory Visit: Payer: Self-pay

## 2022-05-07 ENCOUNTER — Emergency Department: Payer: PPO

## 2022-05-07 DIAGNOSIS — R413 Other amnesia: Secondary | ICD-10-CM

## 2022-05-07 DIAGNOSIS — M6281 Muscle weakness (generalized): Secondary | ICD-10-CM | POA: Diagnosis not present

## 2022-05-07 DIAGNOSIS — R900 Intracranial space-occupying lesion found on diagnostic imaging of central nervous system: Secondary | ICD-10-CM

## 2022-05-07 DIAGNOSIS — R519 Headache, unspecified: Secondary | ICD-10-CM

## 2022-05-07 DIAGNOSIS — C3490 Malignant neoplasm of unspecified part of unspecified bronchus or lung: Secondary | ICD-10-CM | POA: Insufficient documentation

## 2022-05-07 DIAGNOSIS — G936 Cerebral edema: Secondary | ICD-10-CM | POA: Diagnosis not present

## 2022-05-07 DIAGNOSIS — C799 Secondary malignant neoplasm of unspecified site: Secondary | ICD-10-CM | POA: Insufficient documentation

## 2022-05-07 DIAGNOSIS — C679 Malignant neoplasm of bladder, unspecified: Secondary | ICD-10-CM | POA: Diagnosis not present

## 2022-05-07 DIAGNOSIS — I629 Nontraumatic intracranial hemorrhage, unspecified: Secondary | ICD-10-CM | POA: Diagnosis not present

## 2022-05-07 DIAGNOSIS — J069 Acute upper respiratory infection, unspecified: Secondary | ICD-10-CM | POA: Diagnosis not present

## 2022-05-07 DIAGNOSIS — G9389 Other specified disorders of brain: Secondary | ICD-10-CM | POA: Diagnosis not present

## 2022-05-07 DIAGNOSIS — I10 Essential (primary) hypertension: Secondary | ICD-10-CM | POA: Insufficient documentation

## 2022-05-07 DIAGNOSIS — R531 Weakness: Secondary | ICD-10-CM

## 2022-05-07 LAB — CBC WITH DIFFERENTIAL/PLATELET
Abs Immature Granulocytes: 0.06 10*3/uL (ref 0.00–0.07)
Basophils Absolute: 0.1 10*3/uL (ref 0.0–0.1)
Basophils Relative: 1 %
Eosinophils Absolute: 0.1 10*3/uL (ref 0.0–0.5)
Eosinophils Relative: 1 %
HCT: 43 % (ref 39.0–52.0)
Hemoglobin: 14.5 g/dL (ref 13.0–17.0)
Immature Granulocytes: 1 %
Lymphocytes Relative: 22 %
Lymphs Abs: 2.1 10*3/uL (ref 0.7–4.0)
MCH: 29.2 pg (ref 26.0–34.0)
MCHC: 33.7 g/dL (ref 30.0–36.0)
MCV: 86.7 fL (ref 80.0–100.0)
Monocytes Absolute: 0.7 10*3/uL (ref 0.1–1.0)
Monocytes Relative: 7 %
Neutro Abs: 6.7 10*3/uL (ref 1.7–7.7)
Neutrophils Relative %: 68 %
Platelets: 241 10*3/uL (ref 150–400)
RBC: 4.96 MIL/uL (ref 4.22–5.81)
RDW: 12.6 % (ref 11.5–15.5)
WBC: 9.7 10*3/uL (ref 4.0–10.5)
nRBC: 0 % (ref 0.0–0.2)

## 2022-05-07 LAB — BASIC METABOLIC PANEL
Anion gap: 7 (ref 5–15)
BUN: 16 mg/dL (ref 8–23)
CO2: 27 mmol/L (ref 22–32)
Calcium: 9.2 mg/dL (ref 8.9–10.3)
Chloride: 99 mmol/L (ref 98–111)
Creatinine, Ser: 0.89 mg/dL (ref 0.61–1.24)
GFR, Estimated: >60 mL/min (ref >60)
Glucose, Bld: 117 mg/dL — ABNORMAL HIGH (ref 70–99)
Potassium: 4.2 mmol/L (ref 3.5–5.1)
Sodium: 133 mmol/L — ABNORMAL LOW (ref 135–145)

## 2022-05-07 LAB — TYPE AND SCREEN
ABO/RH(D): B NEG
Antibody Screen: NEGATIVE

## 2022-05-07 LAB — PROTIME-INR
INR: 1.1 (ref 0.8–1.2)
Prothrombin Time: 13.8 seconds (ref 11.4–15.2)

## 2022-05-07 MED ORDER — ACETAMINOPHEN 500 MG PO TABS
1000.0000 mg | ORAL_TABLET | Freq: Once | ORAL | Status: AC
  Administered 2022-05-07: 1000 mg via ORAL
  Filled 2022-05-07: qty 2

## 2022-05-07 MED ORDER — ONDANSETRON HCL 4 MG/2ML IJ SOLN
4.0000 mg | Freq: Once | INTRAMUSCULAR | Status: AC
  Administered 2022-05-07: 4 mg via INTRAVENOUS
  Filled 2022-05-07: qty 2

## 2022-05-07 MED ORDER — LEVETIRACETAM 500 MG PO TABS: 500.0000 mg | ORAL_TABLET | Freq: Two times a day (BID) | ORAL | 3 refills | Status: DC

## 2022-05-07 MED ORDER — HYDRALAZINE HCL 20 MG/ML IJ SOLN
5.0000 mg | Freq: Once | INTRAMUSCULAR | Status: AC
  Administered 2022-05-07: 5 mg via INTRAVENOUS
  Filled 2022-05-07: qty 1

## 2022-05-07 MED ORDER — LABETALOL HCL 5 MG/ML IV SOLN: 10.0000 mg | Freq: Once | INTRAVENOUS | Status: DC

## 2022-05-07 MED ORDER — LEVETIRACETAM IN NACL 500 MG/100ML IV SOLN
500.0000 mg | Freq: Once | INTRAVENOUS | Status: AC
  Administered 2022-05-07: 500 mg via INTRAVENOUS
  Filled 2022-05-07: qty 100

## 2022-05-07 MED ORDER — GADOBUTROL 1 MMOL/ML IV SOLN
8.0000 mL | Freq: Once | INTRAVENOUS | Status: AC | PRN
  Administered 2022-05-07: 8 mL via INTRAVENOUS

## 2022-05-07 MED ORDER — MORPHINE SULFATE (PF) 2 MG/ML IV SOLN
2.0000 mg | Freq: Once | INTRAVENOUS | Status: AC
  Administered 2022-05-07: 2 mg via INTRAVENOUS
  Filled 2022-05-07: qty 1

## 2022-05-07 NOTE — ED Triage Notes (Signed)
Pt here with a headache. Pt had a CT of the head today showing a possible brain bleed.

## 2022-05-07 NOTE — ED Provider Notes (Signed)
-----------------------------------------   7:03 PM on 05/07/2022 ----------------------------------------- Patient care assumed from Dr. Ellender Hose.  Dr. Cari Caraway has evaluated the patient as well.  He does not want a procedure performed.  After long discussion with Dr. Cari Caraway they have decided that they would like to go home and follow-up with Dr. Janese Banks.  Dr. Izora Ribas would like the patient started on Keppra which we will start at 500 mg twice a day.  Discussed with the patient not driving he understands and is agreeable.  They are hoping to go home soon.  Will discharge per their wishes and they will follow-up with Dr. Janese Banks.   Harvest Dark, MD 05/07/22 1904

## 2022-05-07 NOTE — ED Provider Notes (Signed)
[facility] Provider Note    Event Date/Time   First MD Initiated Contact with Patient 05/07/22 1228     (approximate)   History   Headache   HPI  Anthony Gonzales is a 89 y.o. male here with headache.  Patient reportedly has had fairly severe, daily, constant headaches for the last 3 weeks.  This is abnormal for him.  It began when he awoke 1 morning and has been persistent since then.  He has had some occasional vision changes as well as loss of balance.  He has not improved or particular worsen.  He decided he would, see his PCP today, who obtained a CT scan which showed hemorrhage versus hemorrhagic mass and he was sent here for further evaluation.   Of note, the patient has a history of hypertension, bladder cancer followed by Anthony Gonzales, currently undergoing chemo for metastatic urothelial cancer with lung and lymph node metastases.  Physical Exam   Triage Vital Signs: ED Triage Vitals  Enc Vitals Group     BP 05/07/22 1223 (!) 140/2     Pulse Rate 05/07/22 1223 (!) 57     Resp 05/07/22 1223 18     Temp 05/07/22 1223 98.1 F (36.7 C)     Temp Source 05/07/22 1223 Oral     SpO2 05/07/22 1223 95 %     Weight 05/07/22 1224 192 lb 14.4 oz (87.5 kg)     Height 05/07/22 1224 6' (1.829 m)     Head Circumference --      Peak Flow --      Pain Score 05/07/22 1223 5     Pain Loc --      Pain Edu? --      Excl. in [place]? --     Most recent vital signs: Vitals:   05/07/22 1501 05/07/22 1518  BP:  (!) 179/82  Pulse:  (!) 55  Resp:  17  Temp:    SpO2: 97% 97%     General: Awake, no distress.  CV:  Good peripheral perfusion.  Regular rate and rhythm. Resp:  Normal work of breathing.  Abd:  No distention.  No tenderness. Other:  Face symmetric.  Speech is normal.  Patient is alert and oriented.  Strength out of 5 bilateral upper and lower extremities.  Normal station light touch.   ED Results / Procedures / Treatments   Labs (all labs  ordered are listed, but only abnormal results are displayed) Labs Reviewed  BASIC METABOLIC PANEL - Abnormal; Notable for the following components:      Result Value   Sodium 133 (*)    Glucose, Bld 117 (*)    All other components within normal limits  CBC WITH DIFFERENTIAL/PLATELET  PROTIME-INR  TYPE AND SCREEN     EKG    RADIOLOGY CT head reviewed from clinic today, shows 4 cm hemorrhagic mass in the left occipital lobe with adjacent parenchymal hemorrhage   I also independently reviewed and agree with radiologist interpretations.   PROCEDURES:  Critical Care performed: No   MEDICATIONS ORDERED IN ED: Medications  levETIRAcetam (KEPPRA) IVPB 500 mg/100 mL premix (0 mg Intravenous Stopped 05/07/22 1501)  gadobutrol (GADAVIST) 1 MMOL/ML injection 8 mL (8 mLs Intravenous Contrast Given 05/07/22 1410)  acetaminophen (TYLENOL) tablet 1,000 mg (1,000 mg Oral Given 05/07/22 1513)  ondansetron (ZOFRAN) injection 4 mg (4 mg Intravenous Given 05/07/22 1513)  morphine (PF) 2 MG/ML injection 2 mg (2 mg Intravenous Given 05/07/22  1514)  hydrALAZINE (APRESOLINE) injection 5 mg (5 mg Intravenous Given 05/07/22 1542)     IMPRESSION / MDM / ASSESSMENT AND PLAN / ED COURSE  I reviewed the triage vital signs and the nursing notes.                              Differential diagnosis includes, but is not limited to, metastatic disease versus ICH  Patient's presentation is most consistent with acute presentation with potential threat to life or bodily function.  The patient is on the cardiac monitor to evaluate for evidence of arrhythmia and/or significant heart rate changes   89 year old male with history of metastatic urothelial cancer here with headache for 3 weeks.  I reviewed his outpatient CT which shows concern for hemorrhagic mass versus intracranial hemorrhage.  Discussed with Dr. Cari Caraway of neurosurgery.  Patient sent for MRI which confirms likely solitary metastatic lesion in  the left occipital lobe.  No midline shift.  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  Patient is otherwise stable.  He does appear hypertensive, was given morphine for his headache which did not improve his blood pressure so will give a dose of hydralazine.  Awaiting discussion with Dr. Cari Caraway regarding further disposition.   FINAL CLINICAL IMPRESSION(S) / ED DIAGNOSES   Final diagnoses:  Intracranial mass  Intracranial bleed ([place])     Rx / DC Orders   ED Discharge Orders     None        Note:  This document was prepared using Dragon voice recognition software and may include unintentional dictation errors.   Duffy Bruce, MD 05/07/22 [phone]

## 2022-05-07 NOTE — Discharge Instructions (Addendum)
Please follow-up with Dr. Janese Banks as soon as you are able.  Please begin taking your antiseizure medication twice daily starting tomorrow.  Return to the emergency department for any symptom personally concerning to yourself.

## 2022-05-07 NOTE — Consult Note (Signed)
Consult requested by:  Dr. Ellender Hose  Consult requested for:  Brain tumor  Primary Physician:  Idelle Crouch, MD  History of Present Illness: 05/07/2022 Anthony Gonzales is here today with a chief complaint of headache.  He presented to his primary doctor today where he reported a headache.  A CT scan was performed and showed no abnormality.  He was then referred to the emergency department for evaluation where MRI scan revealed a likely solitary metastasis.  I was consulted for recommendations.  He denies headache.  He has had some issues with confusion, but it has been mild.  He lives alone with his wife.  He reports that he does have some trouble with his vision to the right.  He is able to read still, but his vision has changed over time.  He has had extensive treatment for urothelial carcinoma.  His oncologist is Dr. Janese Banks.  He is currently on chemotherapy every 3 weeks.  I have utilized the care everywhere function in epic to review the outside records available from external health systems.  Review of Systems:  A 10 point review of systems is negative, except for the pertinent positives and negatives detailed in the HPI.  Past Medical History: Past Medical History:  Diagnosis Date   Arthritis    Benign fibroma of prostate 08/23/2013   BPH (benign prostatic hyperplasia)    Calculus of kidney 08/23/2013   Glaucoma    no drops in 3 mo pressure good, pt denies glaucoma, eye pressure has been measuring alright.   History of kidney stones    HLD (hyperlipidemia)    HOH (hard of hearing)    Left Hearing Aid   HTN (hypertension) 12/26/2014   Hypertension    Hyponatremia 12/26/2014   Migraines    history of migraines when he was younger.   Restless leg syndrome    Sinus drainage    Skin cancer    Skin cancer    left hand 06/2019   Urothelial cancer ([place])    chemo tx's.   UTI (lower urinary tract infection) 12/26/2014   Vertigo     Past Surgical History: Past  Surgical History:  Procedure Laterality Date   COLONOSCOPY     CYSTOSCOPY W/ RETROGRADES Right 01/30/2015   Procedure: CYSTOSCOPY WITH RETROGRADE PYELOGRAM;  Surgeon: [name], MD;  Location: [facility];  Service: Urology;  Laterality: Right;   CYSTOSCOPY W/ RETROGRADES Bilateral 02/26/2016   Procedure: CYSTOSCOPY WITH RETROGRADE PYELOGRAM;  Surgeon: [name], MD;  Location: [facility];  Service: Urology;  Laterality: Bilateral;   CYSTOSCOPY W/ URETERAL STENT PLACEMENT Right 08/20/2015   Procedure: CYSTOSCOPY WITH RETROGRADE PYELOGRAM/POSSIBLE URETERAL STENT PLACEMENT/BLADDER BIOPSY;  Surgeon: [name], MD;  Location: [facility];  Service: Urology;  Laterality: Right;   CYSTOSCOPY W/ URETERAL STENT PLACEMENT Right 09/12/2015   Procedure: CYSTOSCOPY WITH STENT REPLACEMENT;  Surgeon: [name], MD;  Location: [facility];  Service: Urology;  Laterality: Right;   CYSTOSCOPY WITH BIOPSY Right 09/12/2015   Procedure: CYSTOSCOPY WITH BLADDER AND URETERAL BIOPSY;  Surgeon: [name], MD;  Location: [facility];  Service: Urology;  Laterality: Right;   CYSTOSCOPY WITH STENT PLACEMENT Right 01/30/2015   Procedure: CYSTOSCOPY WITH STENT PLACEMENT;  Surgeon: [name], MD;  Location: [facility];  Service: Urology;  Laterality: Right;   CYSTOSCOPY WITH STENT PLACEMENT Right 12/28/2017   Procedure: CYSTOSCOPY WITH STENT Exchange;  Surgeon: [name], MD;  Location: [facility];  Service: Urology;  Laterality: Right;  CYSTOSCOPY/URETEROSCOPY/HOLMIUM LASER/STENT PLACEMENT Right 08/19/2017   Procedure: CYSTOSCOPY/URETEROSCOPY/HOLMIUM LASER/STENT PLACEMENT;  Surgeon: [name], MD;  Location: [facility];  Service: Urology;  Laterality: Right;   EYE SURGERY Bilateral    Cataract Extraction with IOL   goiter removal     HOLMIUM LASER APPLICATION N/A [id number]   Procedure:  HOLMIUM LASER APPLICATION;  Surgeon: [name], MD;  Location: [facility];  Service: Urology;  Laterality: N/A;    PORTA CATH INSERTION N/A 09/23/2017   Procedure: PORTA CATH INSERTION;  Surgeon: Algernon Huxley, MD;  Location: [facility];  Service: Cardiovascular;  Laterality: N/A;   SPERMATOCELECTOMY     TONSILLECTOMY     TRANSURETHRAL RESECTION OF BLADDER TUMOR N/A 02/26/2016   Procedure: TRANSURETHRAL RESECTION OF BLADDER TUMOR (TURBT);  Surgeon: [name], MD;  Location: [facility];  Service: Urology;  Laterality: N/A;   TRANSURETHRAL RESECTION OF BLADDER TUMOR WITH MITOMYCIN-C N/A 09/12/2015   Procedure: TRANSURETHRAL RESECTION OF BLADDER TUMOR ;  Surgeon: [name], MD;  Location: [facility];  Service: Urology;  Laterality: N/A;   TRANSURETHRAL RESECTION OF BLADDER TUMOR WITH MITOMYCIN-C N/A 03/24/2016   Procedure: TRANSURETHRAL RESECTION OF BLADDER TUMOR WITH MITOMYCIN-C  (SMALL);  Surgeon: [name], MD;  Location: [facility];  Service: Urology;  Laterality: N/A;   URETERAL BIOPSY Right 08/19/2017   Procedure: Renal Mass BIOPSY;  Surgeon: [name], MD;  Location: [facility];  Service: Urology;  Laterality: Right;   URETEROSCOPY Right 01/30/2015   Procedure: URETEROSCOPY/ WITH BIOPSY AND CYTOLOGY BRUSHING;  Surgeon: [name], MD;  Location: [facility];  Service: Urology;  Laterality: Right;   URETEROSCOPY Right 08/20/2015   Procedure: URETEROSCOPY;  Surgeon: [name], MD;  Location: [facility];  Service: Urology;  Laterality: Right;   URETEROSCOPY Right 09/12/2015   Procedure: URETEROSCOPY;  Surgeon: [name], MD;  Location: [facility];  Service: Urology;  Laterality: Right;   URETEROSCOPY Right 02/26/2016   Procedure: URETEROSCOPY;  Surgeon: [name], MD;  Location: [facility];  Service: Urology;  Laterality: Right;    Allergies: Allergies as of 05/07/2022 - Review Complete 05/07/2022  Allergen Reaction Noted   Demerol [meperidine] Nausea And Vomiting 12/25/2014   Lipitor [atorvastatin] Swelling 01/24/2015   Sulfa antibiotics Nausea And Vomiting and Rash 08/31/2015     Medications: No outpatient medications have been marked as taking for the 05/07/22 encounter [facility] Encounter).    Social History: Social History   Tobacco Use   Smoking status: Never   Smokeless tobacco: Never  Vaping Use   Vaping Use: Never used  Substance Use Topics   Alcohol use: No    Alcohol/week: 0.0 standard drinks of alcohol   Drug use: No    Family Medical History: Family History  Problem Relation Age of Onset   Hypertension Mother    Hypertension Father    Prostate cancer Brother    Kidney disease Neg Hx    Kidney cancer Neg Hx    Bladder Cancer Neg Hx     Physical Examination: Vitals:   05/07/22 1645 05/07/22 1757  BP: (!) 152/69   Pulse: (!) 51   Resp: (!) 21   Temp:  98.3 F (36.8 C)  SpO2: 92%     General: Patient is well developed, well nourished, calm, collected, and in no apparent distress. Attention to examination is appropriate.  Neck:   Supple.  Full range of motion.  Respiratory: Patient is breathing without any difficulty.   NEUROLOGICAL:     Awake, alert, oriented  to person, place, and time.  He needed choices for year.  Speech is clear and fluent.  Cranial Nerves: Pupils equal round and reactive to light.  Visual field examination shows diminished vision to his right upper and lower quadrants.  Facial tone is symmetric.  Facial sensation is symmetric. Shoulder shrug is symmetric. Tongue protrusion is midline.  There is no pronator drift.  ROM of spine: full.    Strength: 5 out of 5 throughout.  Hoffman's is absent.   Bilateral upper and lower extremity sensation is intact to light touch.    No evidence of dysmetria noted.  Gait is untested.     Medical Decision Making  Imaging: MRI Brain 05/07/2022 IMPRESSION: 1. 6.0 cm hemorrhagic mass in the left occipital lobe most suspicious for solitary metastatic lesion. Regional mass effect results in effacement of the left occipital horn but no midline shift. 2. 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 3. No other lesions identified.     Electronically Signed   By: Valetta Mole M.D.   On: 05/07/2022 14:35  I have personally reviewed the images and agree with the above interpretation.  Assessment and Plan: Anthony Gonzales is a pleasant 89 y.o. 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  There is a hemorrhagic component of this.  It is possible that this is a primary glial neoplasm, but metastasis would be more likely.  This was not present on his MRI scan a year ago, so represents a failure of treatment with progression of underlying disease if it is related to his prior known cancer.  We discussed the options which do include resection of this tumor for definitive diagnosis as well as treatment followed by potential chemotherapy and radiation versus watchful waiting and consideration of either palliative radiation versus hospice.  He would like to discuss this with his oncologist Dr. Janese Banks before making any steps forward.  I contacted Dr. Janese Banks, who will coordinate a discussion with him.  He will be evaluated for discharge and discharged with Keppra 500 mg twice daily.  If he changes his mind and would like to consider surgical resection, I remain available to him.  I have communicated my recommendations to the requesting physician and coordinated care to facilitate these recommendations.     Kaya Klausing K. Izora Ribas MD, [facility] Neurosurgery

## 2022-05-08 ENCOUNTER — Other Ambulatory Visit: Payer: Self-pay | Admitting: *Deleted

## 2022-05-08 DIAGNOSIS — R531 Weakness: Secondary | ICD-10-CM

## 2022-05-08 DIAGNOSIS — C669 Malignant neoplasm of unspecified ureter: Secondary | ICD-10-CM

## 2022-05-08 DIAGNOSIS — R930 Abnormal findings on diagnostic imaging of skull and head, not elsewhere classified: Secondary | ICD-10-CM

## 2022-05-13 ENCOUNTER — Ambulatory Visit
Admission: RE | Admit: 2022-05-13 | Discharge: 2022-05-13 | Disposition: A | Discharge status: Home / Self Care | Payer: PPO | Source: Ambulatory Visit | Attending: Oncology | Admitting: Oncology

## 2022-05-13 DIAGNOSIS — K862 Cyst of pancreas: Secondary | ICD-10-CM | POA: Diagnosis not present

## 2022-05-13 DIAGNOSIS — R930 Abnormal findings on diagnostic imaging of skull and head, not elsewhere classified: Secondary | ICD-10-CM | POA: Diagnosis not present

## 2022-05-13 DIAGNOSIS — C669 Malignant neoplasm of unspecified ureter: Secondary | ICD-10-CM | POA: Insufficient documentation

## 2022-05-13 DIAGNOSIS — C679 Malignant neoplasm of bladder, unspecified: Secondary | ICD-10-CM | POA: Diagnosis not present

## 2022-05-13 DIAGNOSIS — K760 Fatty (change of) liver, not elsewhere classified: Secondary | ICD-10-CM | POA: Insufficient documentation

## 2022-05-13 DIAGNOSIS — R918 Other nonspecific abnormal finding of lung field: Secondary | ICD-10-CM | POA: Diagnosis not present

## 2022-05-13 DIAGNOSIS — N323 Diverticulum of bladder: Secondary | ICD-10-CM | POA: Insufficient documentation

## 2022-05-13 DIAGNOSIS — K8020 Calculus of gallbladder without cholecystitis without obstruction: Secondary | ICD-10-CM | POA: Diagnosis not present

## 2022-05-13 DIAGNOSIS — K5730 Diverticulosis of large intestine without perforation or abscess without bleeding: Secondary | ICD-10-CM | POA: Diagnosis not present

## 2022-05-13 DIAGNOSIS — R531 Weakness: Secondary | ICD-10-CM | POA: Insufficient documentation

## 2022-05-13 DIAGNOSIS — N289 Disorder of kidney and ureter, unspecified: Secondary | ICD-10-CM | POA: Diagnosis not present

## 2022-05-13 MED ORDER — IOHEXOL 300 MG/ML  SOLN
100.0000 mL | Freq: Once | INTRAMUSCULAR | Status: AC | PRN
  Administered 2022-05-13: 85 mL via INTRAVENOUS

## 2022-05-14 ENCOUNTER — Encounter: Payer: Self-pay | Admitting: Oncology

## 2022-05-14 ENCOUNTER — Inpatient Hospital Stay: Payer: PPO

## 2022-05-14 ENCOUNTER — Inpatient Hospital Stay (HOSPITAL_BASED_OUTPATIENT_CLINIC_OR_DEPARTMENT_OTHER): Payer: PPO | Admitting: Oncology

## 2022-05-14 ENCOUNTER — Other Ambulatory Visit: Payer: Self-pay | Admitting: *Deleted

## 2022-05-14 VITALS — BP 167/82 | HR 64 | Temp 96.4°F | Resp 18 | Wt 191.0 lb

## 2022-05-14 DIAGNOSIS — C679 Malignant neoplasm of bladder, unspecified: Secondary | ICD-10-CM | POA: Diagnosis not present

## 2022-05-14 DIAGNOSIS — N400 Enlarged prostate without lower urinary tract symptoms: Secondary | ICD-10-CM | POA: Insufficient documentation

## 2022-05-14 DIAGNOSIS — Z85828 Personal history of other malignant neoplasm of skin: Secondary | ICD-10-CM | POA: Insufficient documentation

## 2022-05-14 DIAGNOSIS — K5730 Diverticulosis of large intestine without perforation or abscess without bleeding: Secondary | ICD-10-CM | POA: Insufficient documentation

## 2022-05-14 DIAGNOSIS — Z79899 Other long term (current) drug therapy: Secondary | ICD-10-CM | POA: Insufficient documentation

## 2022-05-14 DIAGNOSIS — Z885 Allergy status to narcotic agent status: Secondary | ICD-10-CM | POA: Insufficient documentation

## 2022-05-14 DIAGNOSIS — Z7189 Other specified counseling: Secondary | ICD-10-CM

## 2022-05-14 DIAGNOSIS — Z5112 Encounter for antineoplastic immunotherapy: Secondary | ICD-10-CM | POA: Insufficient documentation

## 2022-05-14 DIAGNOSIS — Z8673 Personal history of transient ischemic attack (TIA), and cerebral infarction without residual deficits: Secondary | ICD-10-CM | POA: Insufficient documentation

## 2022-05-14 DIAGNOSIS — K862 Cyst of pancreas: Secondary | ICD-10-CM | POA: Insufficient documentation

## 2022-05-14 DIAGNOSIS — I6782 Cerebral ischemia: Secondary | ICD-10-CM | POA: Insufficient documentation

## 2022-05-14 DIAGNOSIS — C669 Malignant neoplasm of unspecified ureter: Secondary | ICD-10-CM

## 2022-05-14 DIAGNOSIS — D333 Benign neoplasm of cranial nerves: Secondary | ICD-10-CM | POA: Insufficient documentation

## 2022-05-14 DIAGNOSIS — Z87440 Personal history of urinary (tract) infections: Secondary | ICD-10-CM | POA: Insufficient documentation

## 2022-05-14 DIAGNOSIS — I3139 Other pericardial effusion (noninflammatory): Secondary | ICD-10-CM | POA: Insufficient documentation

## 2022-05-14 DIAGNOSIS — C7801 Secondary malignant neoplasm of right lung: Secondary | ICD-10-CM | POA: Diagnosis not present

## 2022-05-14 DIAGNOSIS — Z87442 Personal history of urinary calculi: Secondary | ICD-10-CM | POA: Insufficient documentation

## 2022-05-14 DIAGNOSIS — Z882 Allergy status to sulfonamides status: Secondary | ICD-10-CM | POA: Insufficient documentation

## 2022-05-14 DIAGNOSIS — G9389 Other specified disorders of brain: Secondary | ICD-10-CM | POA: Diagnosis not present

## 2022-05-14 DIAGNOSIS — G939 Disorder of brain, unspecified: Secondary | ICD-10-CM | POA: Insufficient documentation

## 2022-05-14 DIAGNOSIS — K760 Fatty (change of) liver, not elsewhere classified: Secondary | ICD-10-CM | POA: Insufficient documentation

## 2022-05-14 DIAGNOSIS — Z86018 Personal history of other benign neoplasm: Secondary | ICD-10-CM | POA: Insufficient documentation

## 2022-05-14 DIAGNOSIS — Z7952 Long term (current) use of systemic steroids: Secondary | ICD-10-CM | POA: Insufficient documentation

## 2022-05-14 DIAGNOSIS — C7931 Secondary malignant neoplasm of brain: Secondary | ICD-10-CM | POA: Diagnosis not present

## 2022-05-14 DIAGNOSIS — C661 Malignant neoplasm of right ureter: Secondary | ICD-10-CM | POA: Insufficient documentation

## 2022-05-14 DIAGNOSIS — Z510 Encounter for antineoplastic radiation therapy: Secondary | ICD-10-CM | POA: Insufficient documentation

## 2022-05-14 DIAGNOSIS — K8020 Calculus of gallbladder without cholecystitis without obstruction: Secondary | ICD-10-CM | POA: Insufficient documentation

## 2022-05-14 DIAGNOSIS — C7802 Secondary malignant neoplasm of left lung: Secondary | ICD-10-CM | POA: Diagnosis not present

## 2022-05-14 DIAGNOSIS — N323 Diverticulum of bladder: Secondary | ICD-10-CM | POA: Insufficient documentation

## 2022-05-14 LAB — CBC WITH DIFFERENTIAL/PLATELET
Abs Immature Granulocytes: 0.06 10*3/uL (ref 0.00–0.07)
Basophils Absolute: 0.1 10*3/uL (ref 0.0–0.1)
Basophils Relative: 1 %
Eosinophils Absolute: 0.1 10*3/uL (ref 0.0–0.5)
Eosinophils Relative: 1 %
HCT: 43 % (ref 39.0–52.0)
Hemoglobin: 14.7 g/dL (ref 13.0–17.0)
Immature Granulocytes: 1 %
Lymphocytes Relative: 18 %
Lymphs Abs: 1.8 10*3/uL (ref 0.7–4.0)
MCH: 29.5 pg (ref 26.0–34.0)
MCHC: 34.2 g/dL (ref 30.0–36.0)
MCV: 86.2 fL (ref 80.0–100.0)
Monocytes Absolute: 0.5 10*3/uL (ref 0.1–1.0)
Monocytes Relative: 5 %
Neutro Abs: 7.4 10*3/uL (ref 1.7–7.7)
Neutrophils Relative %: 74 %
Platelets: 297 10*3/uL (ref 150–400)
RBC: 4.99 MIL/uL (ref 4.22–5.81)
RDW: 12.7 % (ref 11.5–15.5)
WBC: 9.9 10*3/uL (ref 4.0–10.5)
nRBC: 0 % (ref 0.0–0.2)

## 2022-05-14 LAB — COMPREHENSIVE METABOLIC PANEL
ALT: 23 U/L (ref 0–44)
AST: 33 U/L (ref 15–41)
Albumin: 3.7 g/dL (ref 3.5–5.0)
Alkaline Phosphatase: 79 U/L (ref 38–126)
Anion gap: 9 (ref 5–15)
BUN: 17 mg/dL (ref 8–23)
CO2: 25 mmol/L (ref 22–32)
Calcium: 8.5 mg/dL — ABNORMAL LOW (ref 8.9–10.3)
Chloride: 99 mmol/L (ref 98–111)
Creatinine, Ser: 0.79 mg/dL (ref 0.61–1.24)
GFR, Estimated: >60 mL/min (ref >60)
Glucose, Bld: 116 mg/dL — ABNORMAL HIGH (ref 70–99)
Potassium: 3.9 mmol/L (ref 3.5–5.1)
Sodium: 133 mmol/L — ABNORMAL LOW (ref 135–145)
Total Bilirubin: 0.5 mg/dL (ref 0.3–1.2)
Total Protein: 6.9 g/dL (ref 6.5–8.1)

## 2022-05-14 MED ORDER — HEPARIN SOD (PORK) LOCK FLUSH 100 UNIT/ML IV SOLN
500.0000 [IU] | Freq: Once | INTRAVENOUS | Status: AC
  Administered 2022-05-14: 500 [IU] via INTRAVENOUS
  Filled 2022-05-14: qty 5

## 2022-05-14 MED ORDER — DEXAMETHASONE 2 MG PO TABS: 2.0000 mg | ORAL_TABLET | Freq: Two times a day (BID) | ORAL | 0 refills | Status: DC

## 2022-05-14 NOTE — Progress Notes (Signed)
Hematology/Oncology Consult note [facility]  [phone] [phone]  Patient Care Team: Idelle Crouch, MD as PCP - General (Internal Medicine) Sindy Guadeloupe, MD as Consulting Physician (Hematology and Oncology) Sindy Guadeloupe, MD as Consulting Physician (Hematology and Oncology) Sindy Guadeloupe, MD as Consulting Physician (Hematology and Oncology) Dasher, Rayvon Char, MD as Consulting Physician (Dermatology)   Name of the patient: Anthony Gonzales  PS:[id number]  04/25/1931   Date of visit: 05/14/22  Diagnosis-metastatic urothelial carcinoma  Chief complaint/ Reason for visit-discussed CT scan and MRI brain results and further management  Heme/Onc history: patient is a 89 year old male with past medical history significant for hypertension and long-standing history of superficial bladder cancer for which he sees [name].  He has undergone TURBT as well as ureteroscopy since 2017 along with mitomycin as well in the past.  Most recently he underwent CT abdomen on 08/06/2017 which showed a soft tissue mass in the right renal pelvis concerning for upper tract urothelial neoplasm.  Soft tissue fullness at the right ureterovesical junction with proximal right hydroureteronephrosis.  Several millimeter attenuation lesion in the pancreatic head.   He underwent diagnostic ureteroscopy and was found to have 2 high-grade lesions within his right kidney.  There was a nodular high-grade appearing lesion in the right anterior renal pelvis.  There was also a second ureteral tumor fungating from the right ureteral orifice extending into the distal ureter also consistent with high-grade invasive urothelial carcinoma.  Muscle invasion could not be assessed.  He also underwent a CT chest which showed a rounded nodule in the right upper lobe measuring 14 mm concerning for metastases.  2 other 3 mm lesions were also noted in the left upper lobe likely benign    Plan initially was neoadjuvant chemotherapy followed by possible surgery but given the presence of lung lesion patient has been referred to oncology for the same.   PET/CT on 09/21/17 showed: IMPRESSION: 1. Hypermetabolic right obturator lymph node is most indicative of metastatic disease. 2. Mildly hypermetabolic left hilar lymph nodes are nonspecific. Continued attention on follow-up exams is warranted. 3. Right upper lobe pulmonary nodule shows metabolism after just above blood pool and is therefore indeterminate. Continued attention on follow-up exams is warranted. 4. Aortic atherosclerosis (ICD10-170.0). Coronary artery calcification. 5. Cholelithiasis    Carboplatin/ gemzar 1 week on and one-week off as patient could not tolerate 2-week on and one week off regimen.  Cycle 1 started on 09/22/2017 Disease progression in March 2020.  Switched to second [employer]   Patient had disease controlled with Keytruda.  However he was noted to have worsening dermatitis with constant flareups requiring steroids.  Beryle Flock is therefore being kept on hold and patient will be switched to third line Padcev.    Treatment has been on hold since February 2021 due to constant flareup of skin rash   Scans in October 2022 showed disease progression with new lung nodules as well as pelvic sidewall adenopathy.  Padcev restarted in November 2022.  Skin rash and blurry vision secondary to Padcev which was put on hold starting May 2023   Progressive disease based on scans in October 2023.  Patient restarted on Keytruda    Interval history-patient was in the ER last week with symptoms of headache and underwent MRI brain with andContrast which showed a 6 cm hemorrhagic mass in the left occipital lobe concerning for solitary metastatic lesion.  Regional mass effect with effacement of the left  occipital horn but no midline shift.  Patient was seen by neurosurgery but was undecided about going for any resection at  his age.  He reports decrease in the field of vision in his right eye.  Left eye vision is normal.  He has had 2-3 episodes of nausea and vomiting this morning.  Denies any significant headaches presently  ECOG PS- 1 Pain scale- 0   Review of systems- Review of Systems  Constitutional:  Negative for chills, fever, malaise/fatigue and weight loss.  HENT:  Negative for congestion, ear discharge and nosebleeds.   Eyes:  Negative for blurred vision.  Respiratory:  Negative for cough, hemoptysis, sputum production, shortness of breath and wheezing.   Cardiovascular:  Negative for chest pain, palpitations, orthopnea and claudication.  Gastrointestinal:  Positive for nausea. Negative for abdominal pain, blood in stool, constipation, diarrhea, heartburn, melena and vomiting.  Genitourinary:  Negative for dysuria, flank pain, frequency, hematuria and urgency.  Musculoskeletal:  Negative for back pain, joint pain and myalgias.  Skin:  Negative for rash.  Neurological:  Negative for dizziness, tingling, focal weakness, seizures, weakness and headaches.  Endo/Heme/Allergies:  Does not bruise/bleed easily.  Psychiatric/Behavioral:  Negative for depression and suicidal ideas. The patient does not have insomnia.       Allergies  Allergen Reactions   Demerol [Meperidine] Nausea And Vomiting   Lipitor [Atorvastatin] Swelling   Sulfa Antibiotics Nausea And Vomiting and Rash     Past Medical History:  Diagnosis Date   Arthritis    Benign fibroma of prostate 08/23/2013   BPH (benign prostatic hyperplasia)    Calculus of kidney 08/23/2013   Glaucoma    no drops in 3 mo pressure good, pt denies glaucoma, eye pressure has been measuring alright.   History of kidney stones    HLD (hyperlipidemia)    HOH (hard of hearing)    Left Hearing Aid   HTN (hypertension) 12/26/2014   Hypertension    Hyponatremia 12/26/2014   Migraines    history of migraines when he was younger.   Restless leg syndrome     Sinus drainage    Skin cancer    Skin cancer    left hand 06/2019   Urothelial cancer ([place])    chemo tx's.   UTI (lower urinary tract infection) 12/26/2014   Vertigo      Past Surgical History:  Procedure Laterality Date   COLONOSCOPY     CYSTOSCOPY W/ RETROGRADES Right 01/30/2015   Procedure: CYSTOSCOPY WITH RETROGRADE PYELOGRAM;  Surgeon: [name], MD;  Location: [facility];  Service: Urology;  Laterality: Right;   CYSTOSCOPY W/ RETROGRADES Bilateral 02/26/2016   Procedure: CYSTOSCOPY WITH RETROGRADE PYELOGRAM;  Surgeon: [name], MD;  Location: [facility];  Service: Urology;  Laterality: Bilateral;   CYSTOSCOPY W/ URETERAL STENT PLACEMENT Right 08/20/2015   Procedure: CYSTOSCOPY WITH RETROGRADE PYELOGRAM/POSSIBLE URETERAL STENT PLACEMENT/BLADDER BIOPSY;  Surgeon: [name], MD;  Location: [facility];  Service: Urology;  Laterality: Right;   CYSTOSCOPY W/ URETERAL STENT PLACEMENT Right 09/12/2015   Procedure: CYSTOSCOPY WITH STENT REPLACEMENT;  Surgeon: [name], MD;  Location: [facility];  Service: Urology;  Laterality: Right;   CYSTOSCOPY WITH BIOPSY Right 09/12/2015   Procedure: CYSTOSCOPY WITH BLADDER AND URETERAL BIOPSY;  Surgeon: [name], MD;  Location: [facility];  Service: Urology;  Laterality: Right;   CYSTOSCOPY WITH STENT PLACEMENT Right 01/30/2015   Procedure: CYSTOSCOPY WITH STENT PLACEMENT;  Surgeon: [name], MD;  Location: [facility];  Service: Urology;  Laterality:  Right;   CYSTOSCOPY WITH STENT PLACEMENT Right 12/28/2017   Procedure: CYSTOSCOPY WITH STENT Exchange;  Surgeon: [name], MD;  Location: [facility];  Service: Urology;  Laterality: Right;   CYSTOSCOPY/URETEROSCOPY/HOLMIUM LASER/STENT PLACEMENT Right 08/19/2017   Procedure: CYSTOSCOPY/URETEROSCOPY/HOLMIUM LASER/STENT PLACEMENT;  Surgeon: [name], MD;  Location: [facility];  Service: Urology;  Laterality: Right;   EYE SURGERY Bilateral    Cataract Extraction with IOL   goiter  removal     HOLMIUM LASER APPLICATION N/A [id number]   Procedure:  HOLMIUM LASER APPLICATION;  Surgeon: [name], MD;  Location: [facility];  Service: Urology;  Laterality: N/A;   PORTA CATH INSERTION N/A 09/23/2017   Procedure: PORTA CATH INSERTION;  Surgeon: Algernon Huxley, MD;  Location: [place] [facility];  Service: Cardiovascular;  Laterality: N/A;   SPERMATOCELECTOMY     TONSILLECTOMY     TRANSURETHRAL RESECTION OF BLADDER TUMOR N/A 02/26/2016   Procedure: TRANSURETHRAL RESECTION OF BLADDER TUMOR (TURBT);  Surgeon: [name], MD;  Location: [facility];  Service: Urology;  Laterality: N/A;   TRANSURETHRAL RESECTION OF BLADDER TUMOR WITH MITOMYCIN-C N/A 09/12/2015   Procedure: TRANSURETHRAL RESECTION OF BLADDER TUMOR ;  Surgeon: [name], MD;  Location: [facility];  Service: Urology;  Laterality: N/A;   TRANSURETHRAL RESECTION OF BLADDER TUMOR WITH MITOMYCIN-C N/A 03/24/2016   Procedure: TRANSURETHRAL RESECTION OF BLADDER TUMOR WITH MITOMYCIN-C  (SMALL);  Surgeon: [name], MD;  Location: [facility];  Service: Urology;  Laterality: N/A;   URETERAL BIOPSY Right 08/19/2017   Procedure: Renal Mass BIOPSY;  Surgeon: [name], MD;  Location: [facility];  Service: Urology;  Laterality: Right;   URETEROSCOPY Right 01/30/2015   Procedure: URETEROSCOPY/ WITH BIOPSY AND CYTOLOGY BRUSHING;  Surgeon: [name], MD;  Location: [facility];  Service: Urology;  Laterality: Right;   URETEROSCOPY Right 08/20/2015   Procedure: URETEROSCOPY;  Surgeon: [name], MD;  Location: [facility];  Service: Urology;  Laterality: Right;   URETEROSCOPY Right 09/12/2015   Procedure: URETEROSCOPY;  Surgeon: [name], MD;  Location: [facility];  Service: Urology;  Laterality: Right;   URETEROSCOPY Right 02/26/2016   Procedure: URETEROSCOPY;  Surgeon: [name], MD;  Location: [facility];  Service: Urology;  Laterality: Right;    Social History   Socioeconomic History   Marital status: Married     Spouse name: Not on file   Number of children: Not on file   Years of education: Not on file   Highest education level: Not on file  Occupational History   Not on file  Tobacco Use   Smoking status: Never   Smokeless tobacco: Never  Vaping Use   Vaping Use: Never used  Substance and Sexual Activity   Alcohol use: No    Alcohol/week: 0.0 standard drinks of alcohol   Drug use: No   Sexual activity: Not Currently  Other Topics Concern   Not on file  Social History Narrative   Not on file   Social Determinants of Health   Financial Resource Strain: Not on file  Food Insecurity: Not on file  Transportation Needs: Not on file  Physical Activity: Not on file  Stress: Not on file  Social Connections: Not on file  Intimate Partner Violence: Not on file    Family History  Problem Relation Age of Onset   Hypertension Mother    Hypertension Father    Prostate cancer Brother    Kidney disease Neg Hx    Kidney cancer Neg Hx  Bladder Cancer Neg Hx      Current Outpatient Medications:    acetaminophen (TYLENOL) 500 MG tablet, Take 500 mg by mouth every 6 (six) hours as needed., Disp: , Rfl:    amLODipine (NORVASC) 5 MG tablet, , Disp: , Rfl:    aspirin EC 325 MG tablet, Take 325 mg by mouth daily., Disp: , Rfl:    aspirin EC 81 MG tablet, Take by mouth., Disp: , Rfl:    camphor-menthol (SARNA) lotion, Apply 1 application topically as needed for itching., Disp: , Rfl:    dexamethasone (DECADRON) 2 MG tablet, Take 1 tablet (2 mg total) by mouth 2 (two) times daily with a meal., Disp: 20 tablet, Rfl: 0   diazepam (VALIUM) 5 MG tablet, TAKE 1 TABLET (5 MG TOTAL) BY MOUTH EVERY 12 (TWELVE) HOURS AS NEEDED FOR ANXIETY OR SLEEP, Disp: , Rfl:    docusate sodium (COLACE) 100 MG capsule, Take 100 mg by mouth 2 (two) times daily., Disp: , Rfl:    fenofibrate micronized (LOFIBRA) 134 MG capsule, Take 134 mg by mouth daily before breakfast. , Disp: , Rfl:    gemfibrozil (LOPID) 600 MG  tablet, Take 600 mg by mouth daily., Disp: , Rfl:    hydrOXYzine (ATARAX) 10 MG tablet, TAKE 1 TABLET BY MOUTH 3 TIMES DAILY AS NEEDED FOR ITCHING., Disp: 120 tablet, Rfl: 0   hydrOXYzine (ATARAX) 25 MG tablet, Take 25 mg by mouth 3 (three) times daily as needed., Disp: , Rfl:    imipramine (TOFRANIL) 10 MG tablet, Take 10 mg by mouth at bedtime., Disp: , Rfl:    levETIRAcetam (KEPPRA) 500 MG tablet, Take 1 tablet (500 mg total) by mouth 2 (two) times daily., Disp: 60 tablet, Rfl: 3   loperamide (IMODIUM A-D) 2 MG tablet, Take by mouth., Disp: , Rfl:    methotrexate (RHEUMATREX) 2.5 MG tablet, Take by mouth., Disp: , Rfl:    metoprolol succinate (TOPROL-XL) 50 MG 24 hr tablet, Take 50 mg by mouth daily., Disp: , Rfl:    metoprolol succinate (TOPROL-XL) 50 MG 24 hr tablet, Take 1 tablet by mouth daily., Disp: , Rfl:    modafinil (PROVIGIL) 100 MG tablet, Take by mouth., Disp: , Rfl:    Multiple Vitamin (MULTIVITAMIN WITH MINERALS) TABS tablet, Take 1 tablet by mouth daily., Disp: , Rfl:    nystatin (MYCOSTATIN/NYSTOP) powder, Apply topically 2 (two) times daily., Disp: , Rfl:    pantoprazole (PROTONIX) 40 MG tablet, Take 40 mg by mouth daily., Disp: , Rfl:    polyethylene glycol (MIRALAX / GLYCOLAX) 17 g packet, Take 17 g by mouth daily., Disp: , Rfl:    predniSONE (DELTASONE) 2.5 MG tablet, Take 1 tablet by mouth daily., Disp: , Rfl:    predniSONE (DELTASONE) 20 MG tablet, Take 20 mg by mouth daily., Disp: , Rfl:    prochlorperazine (COMPAZINE) 10 MG tablet, Take 1 tablet (10 mg total) by mouth every 6 (six) hours as needed for nausea or vomiting., Disp: 30 tablet, Rfl: 0   psyllium (METAMUCIL) 58.6 % packet, Take 1 packet by mouth daily., Disp: , Rfl:    senna (SENOKOT) 8.6 MG tablet, Take 2 tablets by mouth daily., Disp: , Rfl:    tamsulosin (FLOMAX) 0.4 MG CAPS capsule, TAKE 1 CAPSULE BY MOUTH EVERY DAY, Disp: 90 capsule, Rfl: 3   triamcinolone cream (KENALOG) 0.1 %, Apply 1 application.  topically 2 (two) times daily., Disp: , Rfl:    pantoprazole (PROTONIX) 20 MG tablet, Take 1  tablet (20 mg total) by mouth daily for 14 days., Disp: 14 tablet, Rfl: 0 No current facility-administered medications for this visit.  Facility-Administered Medications Ordered in Other Visits:    heparin lock flush 100 UNIT/ML injection, , , ,    heparin lock flush 100 UNIT/ML injection, , , ,    heparin lock flush 100 UNIT/ML injection, , , ,    heparin lock flush 100 unit/mL, 500 Units, Intravenous, Once, Sindy Guadeloupe, MD   sodium chloride flush (NS) 0.9 % injection 10 mL, 10 mL, Intravenous, Once, Sindy Guadeloupe, MD   sodium chloride flush (NS) 0.9 % injection 10 mL, 10 mL, Intravenous, PRN, Sindy Guadeloupe, MD   sodium chloride flush (NS) 0.9 % injection 10 mL, 10 mL, Intravenous, PRN, Sindy Guadeloupe, MD, 10 mL at 08/21/21 0841  Physical exam:  Vitals:   05/14/22 1332  BP: (!) 167/82  Pulse: 64  Resp: 18  Temp: (!) 96.4 F (35.8 C)  TempSrc: Tympanic  SpO2: 100%  Weight: 191 lb (86.6 kg)   Physical Exam Cardiovascular:     Rate and Rhythm: Normal rate and regular rhythm.     Heart sounds: Normal heart sounds.  Pulmonary:     Effort: Pulmonary effort is normal.     Breath sounds: Normal breath sounds.  Abdominal:     General: Bowel sounds are normal.     Palpations: Abdomen is soft.  Skin:    General: Skin is warm and dry.  Neurological:     Mental Status: He is alert and oriented to person, place, and time.         Latest Ref Rng & Units 05/14/2022    1:04 PM  CMP  Glucose 70 - 99 mg/dL 116   BUN 8 - 23 mg/dL 17   Creatinine 0.61 - 1.24 mg/dL 0.79   Sodium 135 - 145 mmol/L 133   Potassium 3.5 - 5.1 mmol/L 3.9   Chloride 98 - 111 mmol/L 99   CO2 22 - 32 mmol/L 25   Calcium 8.9 - 10.3 mg/dL 8.5   Total Protein 6.5 - 8.1 g/dL 6.9   Total Bilirubin 0.3 - 1.2 mg/dL 0.5   Alkaline Phos 38 - 126 U/L 79   AST 15 - 41 U/L 33   ALT 0 - 44 U/L 23       Latest Ref Rng &  Units 05/14/2022    1:04 PM  CBC  WBC 4.0 - 10.5 K/uL 9.9   Hemoglobin 13.0 - 17.0 g/dL 14.7   Hematocrit 39.0 - 52.0 % 43.0   Platelets 150 - 400 K/uL 297     No images are attached to the encounter.  CT CHEST ABDOMEN PELVIS W CONTRAST  Result Date: 05/14/2022 CLINICAL DATA:  Urothelial neoplasm. Restaging. * Tracking Code: BO * EXAM: CT CHEST, ABDOMEN, AND PELVIS WITH CONTRAST TECHNIQUE: Multidetector CT imaging of the chest, abdomen and pelvis was performed following the standard protocol during bolus administration of intravenous contrast. RADIATION DOSE REDUCTION: This exam was performed according to the departmental dose-optimization program which includes automated exposure control, adjustment of the mA and/or kV according to patient size and/or use of iterative reconstruction technique. CONTRAST:  36m OMNIPAQUE IOHEXOL 300 MG/ML  SOLN COMPARISON:  CT 03/14/2022 and older FINDINGS: CT CHEST FINDINGS Cardiovascular: Right upper chest port. Trace pericardial effusion. The heart is nonenlarged. Coronary artery calcifications are seen. The thoracic aorta has a normal course and caliber with mild atherosclerotic plaque. Mediastinum/Nodes:  Normal caliber thoracic esophagus. No specific abnormal lymph node enlargement identified in the axillary region, hilum or mediastinum. Preserved thyroid gland. Lungs/Pleura: Is some linear opacity seen dependently along the lungs likely scar or atelectasis. Apical pleural thickening. No pneumothorax or effusion. Multiple lung nodules are once again identified. Specific lesions will be followed for continuity. The posterior right upper lobe lung nodule which previously measured 5 mm, today on series 3, image 43 measures 9 by 8 mm. The nodule in the medial aspect of the inferior right upper lobe which previously measured 13 mm, today on series 3, image 82 measures 13 by 9 mm, similar. The 10 mm nodule in the right lower lobe anteriorly, today on image 85 of series 3  measures 16 by 17 mm. The 12 mm left lower lobe nodule on the prior examination abutting the interlobar fissure, today on image 93 of series 3 measures 13 by 12 mm, slightly larger. 2 mm left lower lobe nodule, today on image 88 of series 3 measures 3 mm. There is also developing new nodular areas in the anterior right upper lobe on image 44 of series 3. Musculoskeletal: Slight curvature of the spine. Mild degenerative changes. CT ABDOMEN PELVIS FINDINGS Hepatobiliary: Mild fatty liver infiltration. Patent portal vein. No space-occupying liver lesion. Dependent stones in the gallbladder. Pancreas: Global atrophy of the pancreas without mass lesion except for a cystic lesion along the uncinate process extending inferiorly. Previously measured 10 mm and today 11 mm on series 2, image 82. There is also small lesion superiorly along the midbody on series 2 image 68 measuring 12 by 8 mm, also unchanged in retrospect. Few other small foci identified. Spleen: Normal in size without focal abnormality. Adrenals/Urinary Tract: Adrenal glands are preserved. There is mild bilateral renal atrophy. No obvious enhancing mass or collecting system dilatation. There is a Bosniak 1 lower pole anterior right-sided renal cystic lesion once again measuring 4.2 cm. The ureters have a normal course and caliber down to the bladder. Preserved contours of the bladder with a posterior left-sided bladder diverticulum once again identified. Stomach/Bowel: Large bowel is of normal course and caliber with scattered colonic stool. Normal retrocecal appendix. Few sigmoid colon diverticula. There is a redundant course of the colon with some lucent transverse colon extending between the anterior liver margin in the anterior abdominal wall. The stomach is nondilated. Small bowel is nondilated. Vascular/Lymphatic: Normal caliber aorta and IVC with scattered vascular calcifications. Once again there is an abnormal right-sided pelvic sidewall lymph node.  On series 2, image 116 today this measures 2.3 by 1.7 cm. Going back to the prior when measured in the same fashion as today's technique this would have measured 2.2 1.5 cm, similar. Reproductive: Prostate is unremarkable. Other: Mild anasarca. Musculoskeletal: Degenerative changes seen of the spine and pelvis. Transitional lumbosacral segment. IMPRESSION: Overall slight interval increase in size of multiple lung nodules. Essentially stable right-sided pelvic sidewall lymph node. No new areas of metastatic disease seen today. Multiple small cystic lesions along the pancreas. Gallstones. Colonic diverticula. Fatty liver infiltration. Electronically Signed   By: Jill Side M.D.   On: 05/14/2022 11:03   MR Brain W and Wo Contrast  Result Date: 05/07/2022 CLINICAL DATA:  Hemorrhagic mass.  History of bladder cancer. EXAM: MRI HEAD WITHOUT AND WITH CONTRAST TECHNIQUE: Multiplanar, multiecho pulse sequences of the brain and surrounding structures were obtained without and with intravenous contrast. CONTRAST:  66m GADAVIST GADOBUTROL 1 MMOL/ML IV SOLN COMPARISON:  Same-day CT head, brain MRI 07/12/2021.  FINDINGS: Brain: There is a 6.0 cm x 3.5 cm x 4.5 cm cystic lesion in the left occipital lobe with internal hemorrhage as seen on the same-day head CT. The lesion demonstrates thin peripheral enhancement as well as areas of nodular enhancement anteriorly (20-16). There is surrounding vasogenic edema with mass effect resulting in effacement of the left occipital horn and regional sulcal effacement but no midline shift. The 2.4 cm x 1.7 cm heterogeneously enhancing lesion centered in the right cerebellar pontine angle/IAC with mild mass effect on the underlying parenchyma is similar in size but with more heterogeneous internal enhancement. There are no other enhancing lesions. There is no acute territorial infarct. Background parenchymal volume is normal. Small foci of FLAIR signal abnormality in the supratentorial  white matter are nonspecific but likely reflects sequela of mild underlying chronic small-vessel ischemic change. Small remote infarcts are noted in the right cerebellar hemisphere. Vascular: Normal flow voids. Skull and upper cervical spine: Normal marrow signal. Sinuses/Orbits: There is mild mucosal thickening in the paranasal sinuses. Bilateral lens implants are in place. The globes and orbits are otherwise unremarkable. Other: None. IMPRESSION: 1. 6.0 cm hemorrhagic mass in the left occipital lobe most suspicious for solitary metastatic lesion. Regional mass effect results in effacement of the left occipital horn but no midline shift. 2. 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 3. No other lesions identified. Electronically Signed   By: Valetta Mole M.D.   On: 05/07/2022 14:35   CT HEAD WO CONTRAST (5MM)  Result Date: 05/07/2022 CLINICAL DATA:  Generalized weakness.  Memory loss.  Headache. EXAM: CT HEAD WITHOUT CONTRAST TECHNIQUE: Contiguous axial images were obtained from the base of the skull through the vertex without intravenous contrast. RADIATION DOSE REDUCTION: This exam was performed according to the departmental dose-optimization program which includes automated exposure control, adjustment of the mA and/or kV according to patient size and/or use of iterative reconstruction technique. COMPARISON:  MRI 07/12/2021 FINDINGS: Brain: No brainstem abnormality is seen. There are old infarctions in the right cerebellum. 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 No significant change since the prior MRI. Cerebral hemispheres show mild chronic small-vessel ischemic change of the white matter. There is hemorrhagic mass lesion in the left occipital lobe measuring at least 4 cm in diameter. Hemorrhage within the  adjacent brain parenchyma. There is some potential this could represent a subacute hemorrhagic infarction, but mass lesion is more likely. MRI of the brain with without contrast would be recommended. No hydrocephalus. No extra-axial collection. Vascular: There is atherosclerotic calcification of the major vessels at the base of the brain. Skull: Negative Sinuses/Orbits: Clear/normal Other: None IMPRESSION: 1. At least 4 cm hemorrhagic mass lesion in the left occipital lobe with adjacent parenchymal hemorrhage. This could represent a subacute hemorrhagic infarction, but mass lesion is more likely. MRI of the brain with without contrast would be recommended. 2. 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 No significant change since the prior MRI. 3. 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 Mild chronic small-vessel ischemic change of the cerebral hemispheric white matter. 4. Atherosclerotic calcification of the major vessels at the base of the brain. 5. Call report in progress. Electronically Signed   By: Nelson Chimes M.D.   On: 05/07/2022 10:59     Assessment and plan- Patient is a 89  y.o. male with metastatic urothelial carcinoma here to discuss further management  I have reviewed CT chest abdomen and pelvis images independently and discussed findings with the patient which shows gradual progression of his lung nodules which are presumed metastatic urothelial carcinoma.  I am concerned he has progressed on Keytruda.  MRI brain also shows a new hemorrhagic 6cm lesion in his left occipital lobe there was also an enhancing mass in the cerebellar pontine angle with mass effect on the underlying parenchyma.  I suspect his nausea may be secondary to his underlying brain mets.  I am referring him to radiation oncology for palliative radiation to the involved area as patient is not interested in proceeding with surgery.  Starting him on low-dose of Decadron 2 mg twice daily.  We  discussed that overall patient has had progression on gemcitabine cisplatin, Keytruda and Padcev.  Moving forward his treatment options are limited.  Fourth line palliative Alimta could be tried based on limited phase 2 data.  This will be given again IV every 3 weeks until progression or toxicity.  I will reevaluate how patient is doing in 2 weeks tentatively after he completes radiation treatment and discuss about considering fourth line Alimta versus proceeding with best supportive care/hospice at that time.  However I do think the palliative radiation should help him with symptoms of headache and nausea and to some extent of his vision as well.   Visit Diagnosis 1. Brain mass   2. 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[place])   3. Goals of care, counseling/discussion      Dr. Randa Evens, MD, MPH [facility] at [facility] [id number] 05/14/2022 3:49 PM

## 2022-05-15 LAB — T4: T4, Total: 6.6 ug/dL (ref 4.5–12.0)

## 2022-05-15 LAB — TSH: TSH: 1.705 u[IU]/mL (ref 0.350–4.500)

## 2022-05-16 ENCOUNTER — Telehealth: Payer: Self-pay

## 2022-05-16 NOTE — Patient Outreach (Signed)
  Care Coordination   Initial Visit Note   05/16/2022 Name: Anthony Gonzales MRN: [id number] DOB: 04/16/1931  Anthony Gonzales is a 89 y.o. year old male who sees Sparks, Leonie Douglas, MD for primary care. I spoke with  Anthony Gonzales by phone today.  What matters to the patients health and wellness today?  My headaches are my biggest problem now. States he is to go on Monday to see the doctor about radiation treatments for the area on his brain.  Pt agreeable to having care coordination services.    Goals Addressed             This Visit's Progress    Care Coordination Activities-Headaches from mass in brain       Interventions Today    Flowsheet Row Most Recent Value  Chronic Disease   Chronic disease during today's visit Other  [Metastatic urothelial  carcinoma]  General Interventions   General Interventions Discussed/Reviewed General Interventions Discussed, Communication with, Referral to Nurse, Doctor Visits  [Scheduled with [place] on 05/26/22]  Doctor Visits Discussed/Reviewed Doctor Visits Discussed, Annual Wellness Visits, PCP  Communication with RN  Education Interventions   Education Provided Provided Education  Provided Verbal Education On When to see the doctor, Other  [Reviewed care coordination services and agreeable to having RNCM call.  Discussed taking Tylenol for headaches as directed]  Pharmacy Interventions   Pharmacy Dicussed/Reviewed Medications and their functions  Safety Interventions   Safety Discussed/Reviewed Safety Discussed, Home Safety  Home Safety Assistive Devices  [Reviewed to use walker at all times]              SDOH assessments and interventions completed:  Yes  SDOH Interventions Today    Flowsheet Row Most Recent Value  SDOH Interventions   Food Insecurity Interventions Intervention Not Indicated  Housing Interventions Intervention Not Indicated  Transportation Interventions Intervention Not Indicated   Utilities Interventions Intervention Not Indicated        Care Coordination Interventions:  Yes, provided   Follow up plan: Follow up call scheduled for 05/26/22 with [place]    Encounter Outcome:  Pt. Visit Completed  Peter Garter RN, [facility], CDE Care Management Coordinator [facility] Management [phone]

## 2022-05-16 NOTE — Patient Instructions (Signed)
Visit Information  Thank you for taking time to visit with me today. Please don't hesitate to contact me if I can be of assistance to you.   Following are the goals we discussed today:   Goals Addressed             This Visit's Progress    Care Coordination Activities-Headaches from mass in brain       Interventions Today    Flowsheet Row Most Recent Value  Chronic Disease   Chronic disease during today's visit Other  [Metastatic urothelial  carcinoma]  General Interventions   General Interventions Discussed/Reviewed General Interventions Discussed, Communication with, Referral to Nurse, Doctor Visits  [Scheduled with [place] on 05/26/22]  Doctor Visits Discussed/Reviewed Doctor Visits Discussed, Annual Wellness Visits, PCP  Communication with RN  Education Interventions   Education Provided Provided Education  Provided Verbal Education On When to see the doctor, Other  [Reviewed care coordination services and agreeable to having RNCM call.  Discussed taking Tylenol for headaches as directed]  Pharmacy Interventions   Pharmacy Dicussed/Reviewed Medications and their functions  Safety Interventions   Safety Discussed/Reviewed Safety Discussed, [place] Devices  [Reviewed to use walker at all times]              Your next appointment is by telephone on 05/26/22 at 9 AM  Please call the care guide team at [phone] if you need to cancel or reschedule your appointment.   If you are experiencing a Mental Health or [place] or need someone to talk to, please call the Suicide and Crisis Lifeline: 988 call the [place] National Suicide Prevention Lifeline: (925) 696-1729 or TTY: 772-372-3354 TTY 212-584-3780) to talk to a trained counselor call 1-800-273-TALK (toll free, 24 hour hotline) call 911   Patient verbalizes understanding of instructions and care plan provided today and agrees to view in [place]. Active MyChart status  and patient understanding of how to access instructions and care plan via MyChart confirmed with patient.     Telephone follow up appointment with care management team member scheduled for: 05/26/22  SIGNATURE Peter Garter RN, [place] [place], [place] Management [phone]

## 2022-05-19 ENCOUNTER — Ambulatory Visit
Admission: RE | Admit: 2022-05-19 | Discharge: 2022-05-19 | Disposition: A | Discharge status: Home / Self Care | Payer: PPO | Source: Ambulatory Visit | Attending: Radiation Oncology | Admitting: Radiation Oncology

## 2022-05-19 ENCOUNTER — Encounter: Payer: Self-pay | Admitting: Radiation Oncology

## 2022-05-19 VITALS — BP 174/81 | HR 55 | Resp 16 | Ht 72.0 in | Wt 191.0 lb

## 2022-05-19 DIAGNOSIS — C679 Malignant neoplasm of bladder, unspecified: Secondary | ICD-10-CM | POA: Diagnosis not present

## 2022-05-19 DIAGNOSIS — C7931 Secondary malignant neoplasm of brain: Secondary | ICD-10-CM

## 2022-05-19 NOTE — Consult Note (Signed)
NEW PATIENT EVALUATION  Name: Anthony Gonzales  MRN: [id number]  Date:   05/19/2022     DOB: Oct 31, 1931   This 89 y.o. male patient presents to the clinic for initial evaluation of large solitary brain metastasis and patient with known stage IV metastatic urothelial carcinoma.  REFERRING PHYSICIAN: Idelle Crouch, MD  CHIEF COMPLAINT: No chief complaint on file.   DIAGNOSIS: The encounter diagnosis was Secondary malignant neoplasm of brain ([place]).   PREVIOUS INVESTIGATIONS:  MRI scans reviewed, chest abdomen pelvic CT scans reviewed Clinical notes reviewed Pathology reports reviewed  HPI: Patient is an 89 year old male who first presented in 2017 with superficial bladder cancer treated with urology.  He had a TURBT.  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  He is gone on develop metastatic disease including lung metastasis.  He had been treated with Keytruda although developed skin reaction also has progressive disease of the pelvis.  Recent CT scan chest abdomen pelvis shows new opacities in the right upper lobe 10 mm nodule in the right lower lobe enlarging right upper lobe pulmonary nodules stable enlarged right pelvic sidewall lymph node.  He presented to the ER recently with headaches MRI of the brain showed a 6 cm hedged hemorrhagic mass in the left occipital lobe concerning for solitary metastatic lesion.  He was declined based on his age for neurosurgical intervention.  He does have a right visual field cut defect.  He has been started on steroids.  He is stable at the present time.  He is now referred to radiation oncology for opinion. PLANNED TREATMENT REGIMEN: IMRT treatment  PAST MEDICAL HISTORY:  has a past medical history of Arthritis, Benign fibroma of prostate (08/23/2013), BPH (benign prostatic hyperplasia), Calculus of kidney (08/23/2013), Glaucoma,  History of kidney stones, HLD (hyperlipidemia), HOH (hard of hearing), HTN (hypertension) (12/26/2014), Hypertension, Hyponatremia (12/26/2014), Migraines, Restless leg syndrome, Sinus drainage, Skin cancer, Skin cancer, Urothelial cancer ([place]), UTI (lower urinary tract infection) (12/26/2014), and Vertigo.    PAST SURGICAL HISTORY:  Past Surgical History:  Procedure Laterality Date   COLONOSCOPY     CYSTOSCOPY W/ RETROGRADES Right 01/30/2015   Procedure: CYSTOSCOPY WITH RETROGRADE PYELOGRAM;  Surgeon: [name], MD;  Location: [facility];  Service: Urology;  Laterality: Right;   CYSTOSCOPY W/ RETROGRADES Bilateral 02/26/2016   Procedure: CYSTOSCOPY WITH RETROGRADE PYELOGRAM;  Surgeon: [name], MD;  Location: [facility];  Service: Urology;  Laterality: Bilateral;   CYSTOSCOPY W/ URETERAL STENT PLACEMENT Right 08/20/2015   Procedure: CYSTOSCOPY WITH RETROGRADE PYELOGRAM/POSSIBLE URETERAL STENT PLACEMENT/BLADDER BIOPSY;  Surgeon: [name], MD;  Location: [facility];  Service: Urology;  Laterality: Right;   CYSTOSCOPY W/ URETERAL STENT PLACEMENT Right 09/12/2015   Procedure: CYSTOSCOPY WITH STENT REPLACEMENT;  Surgeon: [name], MD;  Location: [facility];  Service: Urology;  Laterality: Right;   CYSTOSCOPY WITH BIOPSY Right 09/12/2015   Procedure: CYSTOSCOPY WITH BLADDER AND URETERAL BIOPSY;  Surgeon: [name], MD;  Location: [facility];  Service: Urology;  Laterality: Right;   CYSTOSCOPY WITH STENT PLACEMENT Right 01/30/2015   Procedure: CYSTOSCOPY WITH STENT PLACEMENT;  Surgeon: [name], MD;  Location: [facility];  Service: Urology;  Laterality: Right;   CYSTOSCOPY WITH STENT PLACEMENT Right 12/28/2017   Procedure: CYSTOSCOPY WITH STENT Exchange;  Surgeon: [name], MD;  Location: [facility];  Service: Urology;  Laterality: Right;   CYSTOSCOPY/URETEROSCOPY/HOLMIUM LASER/STENT PLACEMENT  Right 08/19/2017   Procedure: CYSTOSCOPY/URETEROSCOPY/HOLMIUM LASER/STENT PLACEMENT;   Surgeon: [name], MD;  Location: [facility];  Service: Urology;  Laterality: Right;   EYE SURGERY Bilateral    Cataract Extraction with IOL   goiter removal     HOLMIUM LASER APPLICATION N/A [id number]   Procedure:  HOLMIUM LASER APPLICATION;  Surgeon: [name], MD;  Location: [facility];  Service: Urology;  Laterality: N/A;   PORTA CATH INSERTION N/A 09/23/2017   Procedure: PORTA CATH INSERTION;  Surgeon: Algernon Huxley, MD;  Location: [facility];  Service: Cardiovascular;  Laterality: N/A;   SPERMATOCELECTOMY     TONSILLECTOMY     TRANSURETHRAL RESECTION OF BLADDER TUMOR N/A 02/26/2016   Procedure: TRANSURETHRAL RESECTION OF BLADDER TUMOR (TURBT);  Surgeon: [name], MD;  Location: [facility];  Service: Urology;  Laterality: N/A;   TRANSURETHRAL RESECTION OF BLADDER TUMOR WITH MITOMYCIN-C N/A 09/12/2015   Procedure: TRANSURETHRAL RESECTION OF BLADDER TUMOR ;  Surgeon: [name], MD;  Location: [facility];  Service: Urology;  Laterality: N/A;   TRANSURETHRAL RESECTION OF BLADDER TUMOR WITH MITOMYCIN-C N/A 03/24/2016   Procedure: TRANSURETHRAL RESECTION OF BLADDER TUMOR WITH MITOMYCIN-C  (SMALL);  Surgeon: [name], MD;  Location: [facility];  Service: Urology;  Laterality: N/A;   URETERAL BIOPSY Right 08/19/2017   Procedure: Renal Mass BIOPSY;  Surgeon: [name], MD;  Location: [facility];  Service: Urology;  Laterality: Right;   URETEROSCOPY Right 01/30/2015   Procedure: URETEROSCOPY/ WITH BIOPSY AND CYTOLOGY BRUSHING;  Surgeon: [name], MD;  Location: [facility];  Service: Urology;  Laterality: Right;   URETEROSCOPY Right 08/20/2015   Procedure: URETEROSCOPY;  Surgeon: [name], MD;  Location: [facility];  Service: Urology;  Laterality: Right;   URETEROSCOPY Right 09/12/2015   Procedure: URETEROSCOPY;  Surgeon: [name], MD;  Location: [facility];  Service: Urology;  Laterality: Right;   URETEROSCOPY Right 02/26/2016   Procedure: URETEROSCOPY;   Surgeon: [name], MD;  Location: [facility];  Service: Urology;  Laterality: Right;    FAMILY HISTORY: family history includes Hypertension in his father and mother; Prostate cancer in his brother.  SOCIAL HISTORY:  reports that he has never smoked. He has never used smokeless tobacco. He reports that he does not drink alcohol and does not use drugs.  ALLERGIES: Demerol [meperidine], Lipitor [atorvastatin], and Sulfa antibiotics  MEDICATIONS:  Current Outpatient Medications  Medication Sig Dispense Refill   dexamethasone (DECADRON) 2 MG tablet Take 1 tablet (2 mg total) by mouth 2 (two) times daily with a meal. 20 tablet 0   acetaminophen (TYLENOL) 500 MG tablet Take 500 mg by mouth every 6 (six) hours as needed.     amLODipine (NORVASC) 5 MG tablet      aspirin EC 325 MG tablet Take 325 mg by mouth daily.     aspirin EC 81 MG tablet Take by mouth.     camphor-menthol (SARNA) lotion Apply 1 application topically as needed for itching.     diazepam (VALIUM) 5 MG tablet TAKE 1 TABLET (5 MG TOTAL) BY MOUTH EVERY 12 (TWELVE) HOURS AS NEEDED FOR ANXIETY OR SLEEP     docusate sodium (COLACE) 100 MG capsule Take 100 mg by mouth 2 (two) times daily.     fenofibrate micronized (LOFIBRA) 134 MG capsule Take 134 mg by mouth daily before breakfast.      gemfibrozil (LOPID) 600 MG tablet Take 600 mg by mouth daily.     hydrOXYzine (ATARAX) 10 MG tablet TAKE  1 TABLET BY MOUTH 3 TIMES DAILY AS NEEDED FOR ITCHING. 120 tablet 0   hydrOXYzine (ATARAX) 25 MG tablet Take 25 mg by mouth 3 (three) times daily as needed.     imipramine (TOFRANIL) 10 MG tablet Take 10 mg by mouth at bedtime.     levETIRAcetam (KEPPRA) 500 MG tablet Take 1 tablet (500 mg total) by mouth 2 (two) times daily. 60 tablet 3   loperamide (IMODIUM A-D) 2 MG tablet Take by mouth.     methotrexate (RHEUMATREX) 2.5 MG tablet Take by mouth.     metoprolol succinate (TOPROL-XL) 50 MG 24 hr tablet Take 50 mg by mouth daily.      metoprolol succinate (TOPROL-XL) 50 MG 24 hr tablet Take 1 tablet by mouth daily.     modafinil (PROVIGIL) 100 MG tablet Take by mouth.     Multiple Vitamin (MULTIVITAMIN WITH MINERALS) TABS tablet Take 1 tablet by mouth daily.     nystatin (MYCOSTATIN/NYSTOP) powder Apply topically 2 (two) times daily.     pantoprazole (PROTONIX) 20 MG tablet Take 1 tablet (20 mg total) by mouth daily for 14 days. 14 tablet 0   pantoprazole (PROTONIX) 40 MG tablet Take 40 mg by mouth daily.     polyethylene glycol (MIRALAX / GLYCOLAX) 17 g packet Take 17 g by mouth daily.     predniSONE (DELTASONE) 2.5 MG tablet Take 1 tablet by mouth daily.     predniSONE (DELTASONE) 20 MG tablet Take 20 mg by mouth daily.     prochlorperazine (COMPAZINE) 10 MG tablet Take 1 tablet (10 mg total) by mouth every 6 (six) hours as needed for nausea or vomiting. 30 tablet 0   psyllium (METAMUCIL) 58.6 % packet Take 1 packet by mouth daily.     senna (SENOKOT) 8.6 MG tablet Take 2 tablets by mouth daily.     tamsulosin (FLOMAX) 0.4 MG CAPS capsule TAKE 1 CAPSULE BY MOUTH EVERY DAY 90 capsule 3   triamcinolone cream (KENALOG) 0.1 % Apply 1 application. topically 2 (two) times daily.     No current facility-administered medications for this encounter.   Facility-Administered Medications Ordered in Other Encounters  Medication Dose Route Frequency Provider Last Rate Last Admin   heparin lock flush 100 UNIT/ML injection            heparin lock flush 100 UNIT/ML injection            heparin lock flush 100 UNIT/ML injection            heparin lock flush 100 unit/mL  500 Units Intravenous Once Sindy Guadeloupe, MD       sodium chloride flush (NS) 0.9 % injection 10 mL  10 mL Intravenous Once Sindy Guadeloupe, MD       sodium chloride flush (NS) 0.9 % injection 10 mL  10 mL Intravenous PRN Sindy Guadeloupe, MD       sodium chloride flush (NS) 0.9 % injection 10 mL  10 mL Intravenous PRN Sindy Guadeloupe, MD   10 mL at 08/21/21 0841    ECOG  PERFORMANCE STATUS:  1 - Symptomatic but completely ambulatory  REVIEW OF SYSTEMS: Patient denies any weight loss, fatigue, weakness, fever, chills or night sweats. Patient denies any loss of vision, blurred vision. Patient denies any ringing  of the ears or hearing loss. No irregular heartbeat. Patient denies heart murmur or history of fainting. Patient denies any chest pain or pain radiating to her upper extremities. Patient  denies any shortness of breath, difficulty breathing at night, cough or hemoptysis. Patient denies any swelling in the lower legs. Patient denies any nausea vomiting, vomiting of blood, or coffee ground material in the vomitus. Patient denies any stomach pain. Patient states has had normal bowel movements no significant constipation or diarrhea. Patient denies any dysuria, hematuria or significant nocturia. Patient denies any problems walking, swelling in the joints or loss of balance. Patient denies any skin changes, loss of hair or loss of weight. Patient denies any excessive worrying or anxiety or significant depression. Patient denies any problems with insomnia. Patient denies excessive thirst, polyuria, polydipsia. Patient denies any swollen glands, patient denies easy bruising or easy bleeding. Patient denies any recent infections, allergies or URI. Patient "s visual fields have not changed significantly in recent time.   PHYSICAL EXAM: BP (!) 174/81 Comment: Pt states this BP is normal for him  Pulse (!) 55   Resp 16   Ht 6' (1.829 m)   Wt 191 lb (86.6 kg)   BMI 25.90 kg/m  Does have an obvious right field cut defect on crude visual fields.  Motor or sensory and DTR levels are equal and symmetric in the upper and lower extremities.  Well-developed well-nourished patient in NAD. HEENT reveals PERLA, EOMI, discs not visualized.  Oral cavity is clear. No oral mucosal lesions are identified. Neck is clear without evidence of cervical or supraclavicular adenopathy. Lungs are  clear to A&P. Cardiac examination is essentially unremarkable with regular rate and rhythm without murmur rub or thrill. Abdomen is benign with no organomegaly or masses noted. Motor sensory and DTR levels are equal and symmetric in the upper and lower extremities. Cranial nerves II through XII are grossly intact. Proprioception is intact. No peripheral adenopathy or edema is identified. No motor or sensory levels are noted. Crude visual fields are within normal range.  LABORATORY DATA: Pathology reports reviewed    RADIOLOGY RESULTS: CT scan chest abdomen pelvis and MRI of brain reviewed compatible with above-stated findings   IMPRESSION: Solitary brain metastasis with patient who with known stage IV urothelial carcinoma PLAN: This time elected ahead with 5 fractions IMRT radiation therapy to this large solitary brain metastasis.  Would plan on delivering 35 Gray in 5 fractions.  Risks and benefits of treatment including possible hair loss fatigue alteration of blood counts all were described in detail to the patient.  I have set him up for simulation this week.  Patient wife both comprehend my treatment plan well.  I would like to take this opportunity to thank you for allowing me to participate in the care of your patient.Noreene Filbert, MD

## 2022-05-21 ENCOUNTER — Other Ambulatory Visit: Payer: Self-pay | Admitting: *Deleted

## 2022-05-21 ENCOUNTER — Ambulatory Visit
Admission: RE | Admit: 2022-05-21 | Discharge: 2022-05-21 | Disposition: A | Discharge status: Home / Self Care | Payer: PPO | Source: Ambulatory Visit | Attending: Radiation Oncology | Admitting: Radiation Oncology

## 2022-05-21 DIAGNOSIS — C679 Malignant neoplasm of bladder, unspecified: Secondary | ICD-10-CM | POA: Diagnosis not present

## 2022-05-21 DIAGNOSIS — Z510 Encounter for antineoplastic radiation therapy: Secondary | ICD-10-CM | POA: Diagnosis not present

## 2022-05-21 DIAGNOSIS — C7931 Secondary malignant neoplasm of brain: Secondary | ICD-10-CM | POA: Diagnosis not present

## 2022-05-21 MED ORDER — DEXAMETHASONE 2 MG PO TABS: 2.0000 mg | ORAL_TABLET | Freq: Two times a day (BID) | ORAL | 0 refills | Status: DC

## 2022-05-23 ENCOUNTER — Other Ambulatory Visit: Payer: Self-pay | Admitting: *Deleted

## 2022-05-23 DIAGNOSIS — Z510 Encounter for antineoplastic radiation therapy: Secondary | ICD-10-CM | POA: Diagnosis not present

## 2022-05-23 DIAGNOSIS — C679 Malignant neoplasm of bladder, unspecified: Secondary | ICD-10-CM | POA: Diagnosis not present

## 2022-05-23 DIAGNOSIS — C7931 Secondary malignant neoplasm of brain: Secondary | ICD-10-CM | POA: Diagnosis not present

## 2022-05-23 NOTE — Telephone Encounter (Signed)
Called asking for refill of Decadron, I see that prescription was sent 3/13 and advised patient wife of this and she said the pharmacy did not let them know but she will call them

## 2022-05-26 ENCOUNTER — Ambulatory Visit: Payer: Self-pay | Admitting: *Deleted

## 2022-05-26 NOTE — Patient Instructions (Signed)
Visit Information  Thank you for taking time to visit with me today. Please don't hesitate to contact me if I can be of assistance to you before our next scheduled telephone appointment.  Following are the goals we discussed today:  Remember appointment at cancer center on Friday, 3/22 at 1pm.  Ask to speak with nutritionist for diet instructions.   Our next appointment is by telephone on 4/1 at 9am  Please call the care guide team at [phone] if you need to cancel or reschedule your appointment.   Please call the Suicide and Crisis Lifeline: 988 call the [place] National Suicide Prevention Lifeline: 517 066 1609 or TTY: (403)169-9053 TTY 534-808-2135) to talk to a trained counselor call 1-800-273-TALK (toll free, 24 hour hotline) call 911 if you are experiencing a Mental Health or [place] or need someone to talk to.  Patient verbalizes understanding of instructions and care plan provided today and agrees to view in [place]. Active MyChart status and patient understanding of how to access instructions and care plan via MyChart confirmed with patient.     The patient has been provided with contact information for the care management team and has been advised to call with any health related questions or concerns.   [place] Management Care Management Coordinator [phone]

## 2022-05-26 NOTE — Patient Outreach (Signed)
  Care Coordination   Follow Up Visit Note   05/26/2022 Name: DRESDEN STELLMACHER MRN: [id number] DOB: 1931-12-13  [name] is a 89 y.o. year old male who sees Sparks, Leonie Douglas, MD for primary care. I spoke with  [name] by phone today.  What matters to the patients health and wellness today?  Will start radiation treatment on Monday, scheduled daily for next week.  Denies any urgent concerns, encouraged to contact this care manager with questions. Contact information provided to wife.     Goals Addressed             This Visit's Progress    Care Coordination Activities-Headaches from mass in brain   On track    Interventions Today    Flowsheet Row Most Recent Value  Chronic Disease   Chronic disease during today's visit Other  [cancer]  General Interventions   General Interventions Discussed/Reviewed Doctor Visits, General Interventions Reviewed, Communication with  Doctor Visits Discussed/Reviewed Doctor Visits Reviewed, Specialist  PCP/Specialist Visits Compliance with follow-up visit  Communication with PCP/Specialists  Education Interventions   Education Provided Provided Education  Provided Verbal Education On Nutrition, When to see the doctor  Nutrition Interventions   Nutrition Discussed/Reviewed Increaing proteins, Supplmental nutrition, Nutrition Reviewed  [Advised to speak to cancer nutritionist]  Safety Interventions   Safety Discussed/Reviewed Home Safety, Safety Reviewed  Home Safety Assistive Devices      Care Coordination Interventions: Assessed patient understanding of cancer diagnosis and recommended treatment plan Reviewed upcoming provider appointments and treatment appointments Assessed available transportation to appointments and treatments. Has consistent/reliable transportation: Yes Assessed support system. Has consistent/reliable family or other support: Yes          SDOH assessments and interventions completed:   No     Care Coordination Interventions:  Yes, provided   Follow up plan: Follow up call scheduled for 4/1    Encounter Outcome:  Pt. Visit Completed   Valente David, RN,  MSN, [place] Care Management Care Management Coordinator [phone]

## 2022-05-30 ENCOUNTER — Encounter: Payer: Self-pay | Admitting: Oncology

## 2022-05-30 ENCOUNTER — Inpatient Hospital Stay (HOSPITAL_BASED_OUTPATIENT_CLINIC_OR_DEPARTMENT_OTHER): Payer: PPO | Admitting: Oncology

## 2022-05-30 VITALS — BP 181/81 | HR 49 | Temp 97.9°F | Resp 18 | Ht 72.0 in | Wt 198.3 lb

## 2022-05-30 DIAGNOSIS — Z510 Encounter for antineoplastic radiation therapy: Secondary | ICD-10-CM | POA: Diagnosis not present

## 2022-05-30 DIAGNOSIS — C669 Malignant neoplasm of unspecified ureter: Secondary | ICD-10-CM | POA: Diagnosis not present

## 2022-05-30 DIAGNOSIS — Z7189 Other specified counseling: Secondary | ICD-10-CM | POA: Diagnosis not present

## 2022-05-30 NOTE — Progress Notes (Signed)
No concerns for the provider. 

## 2022-05-31 NOTE — Progress Notes (Signed)
Hematology/Oncology Consult note [facility]  [phone] [phone]  Patient Care Team: Idelle Crouch, MD as PCP - General (Internal Medicine) Sindy Guadeloupe, MD as Consulting Physician (Hematology and Oncology) Sindy Guadeloupe, MD as Consulting Physician (Hematology and Oncology) Sindy Guadeloupe, MD as Consulting Physician (Hematology and Oncology) Dasher, Rayvon Char, MD as Consulting Physician (Dermatology) Valente David, RN as [place] Management   Name of the patient: Anthony Gonzales  PS:[id number]  [birth year]   Date of visit: 05/31/22  Diagnosis- metastatic urothelial carcinoma   Chief complaint/ Reason for visit- discuss goals of care and ct results  Heme/Onc history: patient is a 89 year old male with past medical history significant for hypertension and long-standing history of superficial bladder cancer for which he sees [name].  He has undergone TURBT as well as ureteroscopy since 2017 along with mitomycin as well in the past.  Most recently he underwent CT abdomen on 08/06/2017 which showed a soft tissue mass in the right renal pelvis concerning for upper tract urothelial neoplasm.  Soft tissue fullness at the right ureterovesical junction with proximal right hydroureteronephrosis.  Several millimeter attenuation lesion in the pancreatic head.   He underwent diagnostic ureteroscopy and was found to have 2 high-grade lesions within his right kidney.  There was a nodular high-grade appearing lesion in the right anterior renal pelvis.  There was also a second ureteral tumor fungating from the right ureteral orifice extending into the distal ureter also consistent with high-grade invasive urothelial carcinoma.  Muscle invasion could not be assessed.  He also underwent a CT chest which showed a rounded nodule in the right upper lobe measuring 14 mm concerning for metastases.  2 other 3 mm lesions were also noted in  the left upper lobe likely benign   Plan initially was neoadjuvant chemotherapy followed by possible surgery but given the presence of lung lesion patient has been referred to oncology for the same.   PET/CT on 09/21/17 showed: IMPRESSION: 1. Hypermetabolic right obturator lymph node is most indicative of metastatic disease. 2. Mildly hypermetabolic left hilar lymph nodes are nonspecific. Continued attention on follow-up exams is warranted. 3. Right upper lobe pulmonary nodule shows metabolism after just above blood pool and is therefore indeterminate. Continued attention on follow-up exams is warranted. 4. Aortic atherosclerosis (ICD10-170.0). Coronary artery calcification. 5. Cholelithiasis    Carboplatin/ gemzar 1 week on and one-week off as patient could not tolerate 2-week on and one week off regimen.  Cycle 1 started on 09/22/2017 Disease progression in March 2020.  Switched to second [employer]   Patient had disease controlled with Keytruda.  However he was noted to have worsening dermatitis with constant flareups requiring steroids.  Beryle Flock is therefore being kept on hold and patient will be switched to third line Padcev.    Treatment has been on hold since February 2021 due to constant flareup of skin rash   Scans in October 2022 showed disease progression with new lung nodules as well as pelvic sidewall adenopathy.  Padcev restarted in November 2022.  Skin rash and blurry vision secondary to Padcev which was put on hold starting May 2023   Progressive disease based on scans in October 2023.  Patient restarted on Keytruda  Interval history- patient has decreased peripheral field of vision in his right eye. Denies other complaints  ECOG PS- 2 Pain scale- 0   Review of systems- Review of Systems  Constitutional:  Negative for chills,  fever, malaise/fatigue and weight loss.  HENT:  Negative for congestion, ear discharge and nosebleeds.   Eyes:  Negative for blurred vision.   Respiratory:  Negative for cough, hemoptysis, sputum production, shortness of breath and wheezing.   Cardiovascular:  Negative for chest pain, palpitations, orthopnea and claudication.  Gastrointestinal:  Negative for abdominal pain, blood in stool, constipation, diarrhea, heartburn, melena, nausea and vomiting.  Genitourinary:  Negative for dysuria, flank pain, frequency, hematuria and urgency.  Musculoskeletal:  Negative for back pain, joint pain and myalgias.  Skin:  Negative for rash.  Neurological:  Negative for dizziness, tingling, focal weakness, seizures, weakness and headaches.  Endo/Heme/Allergies:  Does not bruise/bleed easily.  Psychiatric/Behavioral:  Negative for depression and suicidal ideas. The patient does not have insomnia.       Allergies  Allergen Reactions   Demerol [Meperidine] Nausea And Vomiting   Lipitor [Atorvastatin] Swelling   Sulfa Antibiotics Nausea And Vomiting and Rash     Past Medical History:  Diagnosis Date   Arthritis    Benign fibroma of prostate 08/23/2013   BPH (benign prostatic hyperplasia)    Calculus of kidney 08/23/2013   Glaucoma    no drops in 3 mo pressure good, pt denies glaucoma, eye pressure has been measuring alright.   History of kidney stones    HLD (hyperlipidemia)    HOH (hard of hearing)    Left Hearing Aid   HTN (hypertension) 12/26/2014   Hypertension    Hyponatremia 12/26/2014   Migraines    history of migraines when he was younger.   Restless leg syndrome    Sinus drainage    Skin cancer    Skin cancer    left hand 06/2019   Urothelial cancer ([place])    chemo tx's.   UTI (lower urinary tract infection) 12/26/2014   Vertigo      Past Surgical History:  Procedure Laterality Date   COLONOSCOPY     CYSTOSCOPY W/ RETROGRADES Right 01/30/2015   Procedure: CYSTOSCOPY WITH RETROGRADE PYELOGRAM;  Surgeon: [name], MD;  Location: [facility];  Service: Urology;  Laterality: Right;   CYSTOSCOPY W/ RETROGRADES  Bilateral 02/26/2016   Procedure: CYSTOSCOPY WITH RETROGRADE PYELOGRAM;  Surgeon: [name], MD;  Location: [facility];  Service: Urology;  Laterality: Bilateral;   CYSTOSCOPY W/ URETERAL STENT PLACEMENT Right 08/20/2015   Procedure: CYSTOSCOPY WITH RETROGRADE PYELOGRAM/POSSIBLE URETERAL STENT PLACEMENT/BLADDER BIOPSY;  Surgeon: [name], MD;  Location: [facility];  Service: Urology;  Laterality: Right;   CYSTOSCOPY W/ URETERAL STENT PLACEMENT Right 09/12/2015   Procedure: CYSTOSCOPY WITH STENT REPLACEMENT;  Surgeon: [name], MD;  Location: [facility];  Service: Urology;  Laterality: Right;   CYSTOSCOPY WITH BIOPSY Right 09/12/2015   Procedure: CYSTOSCOPY WITH BLADDER AND URETERAL BIOPSY;  Surgeon: [name], MD;  Location: [facility];  Service: Urology;  Laterality: Right;   CYSTOSCOPY WITH STENT PLACEMENT Right 01/30/2015   Procedure: CYSTOSCOPY WITH STENT PLACEMENT;  Surgeon: [name], MD;  Location: [facility];  Service: Urology;  Laterality: Right;   CYSTOSCOPY WITH STENT PLACEMENT Right 12/28/2017   Procedure: CYSTOSCOPY WITH STENT Exchange;  Surgeon: [name], MD;  Location: [facility];  Service: Urology;  Laterality: Right;   CYSTOSCOPY/URETEROSCOPY/HOLMIUM LASER/STENT PLACEMENT Right 08/19/2017   Procedure: CYSTOSCOPY/URETEROSCOPY/HOLMIUM LASER/STENT PLACEMENT;  Surgeon: [name], MD;  Location: [facility];  Service: Urology;  Laterality: Right;   EYE SURGERY Bilateral    Cataract Extraction with IOL   goiter removal     HOLMIUM LASER APPLICATION N/A  08/20/2015   Procedure:  HOLMIUM LASER APPLICATION;  Surgeon: [name], MD;  Location: [facility];  Service: Urology;  Laterality: N/A;   PORTA CATH INSERTION N/A 09/23/2017   Procedure: PORTA CATH INSERTION;  Surgeon: Algernon Huxley, MD;  Location: [place] [facility];  Service: Cardiovascular;  Laterality: N/A;   SPERMATOCELECTOMY     TONSILLECTOMY     TRANSURETHRAL RESECTION OF BLADDER TUMOR N/A 02/26/2016    Procedure: TRANSURETHRAL RESECTION OF BLADDER TUMOR (TURBT);  Surgeon: [name], MD;  Location: [facility];  Service: Urology;  Laterality: N/A;   TRANSURETHRAL RESECTION OF BLADDER TUMOR WITH MITOMYCIN-C N/A 09/12/2015   Procedure: TRANSURETHRAL RESECTION OF BLADDER TUMOR ;  Surgeon: [name], MD;  Location: [facility];  Service: Urology;  Laterality: N/A;   TRANSURETHRAL RESECTION OF BLADDER TUMOR WITH MITOMYCIN-C N/A 03/24/2016   Procedure: TRANSURETHRAL RESECTION OF BLADDER TUMOR WITH MITOMYCIN-C  (SMALL);  Surgeon: [name], MD;  Location: [facility];  Service: Urology;  Laterality: N/A;   URETERAL BIOPSY Right 08/19/2017   Procedure: Renal Mass BIOPSY;  Surgeon: [name], MD;  Location: [facility];  Service: Urology;  Laterality: Right;   URETEROSCOPY Right 01/30/2015   Procedure: URETEROSCOPY/ WITH BIOPSY AND CYTOLOGY BRUSHING;  Surgeon: [name], MD;  Location: [facility];  Service: Urology;  Laterality: Right;   URETEROSCOPY Right 08/20/2015   Procedure: URETEROSCOPY;  Surgeon: [name], MD;  Location: [facility];  Service: Urology;  Laterality: Right;   URETEROSCOPY Right 09/12/2015   Procedure: URETEROSCOPY;  Surgeon: [name], MD;  Location: [facility];  Service: Urology;  Laterality: Right;   URETEROSCOPY Right 02/26/2016   Procedure: URETEROSCOPY;  Surgeon: [name], MD;  Location: [facility];  Service: Urology;  Laterality: Right;    Social History   Socioeconomic History   Marital status: Married    Spouse name: Not on file   Number of children: Not on file   Years of education: Not on file   Highest education level: Not on file  Occupational History   Not on file  Tobacco Use   Smoking status: Never   Smokeless tobacco: Never  Vaping Use   Vaping Use: Never used  Substance and Sexual Activity   Alcohol use: No    Alcohol/week: 0.0 standard drinks of alcohol   Drug use: No   Sexual activity: Not Currently  Other Topics Concern   Not on  file  Social History Narrative   Not on file   Social Determinants of Health   Financial Resource Strain: Not on file  Food Insecurity: No Food Insecurity (05/16/2022)   Hunger Vital Sign    Worried About Running Out of Food in the Last Year: Never true    Ran Out of Food in the Last Year: Never true  Transportation Needs: No Transportation Needs (05/16/2022)   PRAPARE - Hydrologist (Medical): No    Lack of Transportation (Non-Medical): No  Physical Activity: Not on file  Stress: Not on file  Social Connections: Not on file  Intimate Partner Violence: Not on file    Family History  Problem Relation Age of Onset   Hypertension Mother    Hypertension Father    Prostate cancer Brother    Kidney disease Neg Hx    Kidney cancer Neg Hx    Bladder Cancer Neg Hx      Current Outpatient Medications:    acetaminophen (TYLENOL) 500 MG tablet, Take 500 mg by mouth every 6 (  six) hours as needed., Disp: , Rfl:    amLODipine (NORVASC) 5 MG tablet, , Disp: , Rfl:    aspirin EC 325 MG tablet, Take 325 mg by mouth daily., Disp: , Rfl:    aspirin EC 81 MG tablet, Take by mouth., Disp: , Rfl:    camphor-menthol (SARNA) lotion, Apply 1 application topically as needed for itching., Disp: , Rfl:    dexamethasone (DECADRON) 2 MG tablet, Take 1 tablet (2 mg total) by mouth 2 (two) times daily with a meal., Disp: 20 tablet, Rfl: 0   dexamethasone (DECADRON) 2 MG tablet, Take 1 tablet (2 mg total) by mouth 2 (two) times daily with a meal., Disp: 30 tablet, Rfl: 0   diazepam (VALIUM) 5 MG tablet, TAKE 1 TABLET (5 MG TOTAL) BY MOUTH EVERY 12 (TWELVE) HOURS AS NEEDED FOR ANXIETY OR SLEEP, Disp: , Rfl:    docusate sodium (COLACE) 100 MG capsule, Take 100 mg by mouth 2 (two) times daily., Disp: , Rfl:    fenofibrate micronized (LOFIBRA) 134 MG capsule, Take 134 mg by mouth daily before breakfast. , Disp: , Rfl:    gemfibrozil (LOPID) 600 MG tablet, Take 600 mg by mouth daily.,  Disp: , Rfl:    hydrOXYzine (ATARAX) 10 MG tablet, TAKE 1 TABLET BY MOUTH 3 TIMES DAILY AS NEEDED FOR ITCHING., Disp: 120 tablet, Rfl: 0   hydrOXYzine (ATARAX) 25 MG tablet, Take 25 mg by mouth 3 (three) times daily as needed., Disp: , Rfl:    imipramine (TOFRANIL) 10 MG tablet, Take 10 mg by mouth at bedtime., Disp: , Rfl:    levETIRAcetam (KEPPRA) 500 MG tablet, Take 1 tablet (500 mg total) by mouth 2 (two) times daily., Disp: 60 tablet, Rfl: 3   loperamide (IMODIUM A-D) 2 MG tablet, Take by mouth., Disp: , Rfl:    methotrexate (RHEUMATREX) 2.5 MG tablet, Take by mouth., Disp: , Rfl:    metoprolol succinate (TOPROL-XL) 50 MG 24 hr tablet, Take 50 mg by mouth daily., Disp: , Rfl:    metoprolol succinate (TOPROL-XL) 50 MG 24 hr tablet, Take 1 tablet by mouth daily., Disp: , Rfl:    modafinil (PROVIGIL) 100 MG tablet, Take by mouth., Disp: , Rfl:    Multiple Vitamin (MULTIVITAMIN WITH MINERALS) TABS tablet, Take 1 tablet by mouth daily., Disp: , Rfl:    nystatin (MYCOSTATIN/NYSTOP) powder, Apply topically 2 (two) times daily., Disp: , Rfl:    pantoprazole (PROTONIX) 40 MG tablet, Take 40 mg by mouth daily., Disp: , Rfl:    polyethylene glycol (MIRALAX / GLYCOLAX) 17 g packet, Take 17 g by mouth daily., Disp: , Rfl:    predniSONE (DELTASONE) 2.5 MG tablet, Take 1 tablet by mouth daily., Disp: , Rfl:    predniSONE (DELTASONE) 20 MG tablet, Take 20 mg by mouth daily., Disp: , Rfl:    prochlorperazine (COMPAZINE) 10 MG tablet, Take 1 tablet (10 mg total) by mouth every 6 (six) hours as needed for nausea or vomiting., Disp: 30 tablet, Rfl: 0   psyllium (METAMUCIL) 58.6 % packet, Take 1 packet by mouth daily., Disp: , Rfl:    senna (SENOKOT) 8.6 MG tablet, Take 2 tablets by mouth daily., Disp: , Rfl:    tamsulosin (FLOMAX) 0.4 MG CAPS capsule, TAKE 1 CAPSULE BY MOUTH EVERY DAY, Disp: 90 capsule, Rfl: 3   triamcinolone cream (KENALOG) 0.1 %, Apply 1 application. topically 2 (two) times daily., Disp: ,  Rfl:    pantoprazole (PROTONIX) 20 MG tablet, Take 1  tablet (20 mg total) by mouth daily for 14 days., Disp: 14 tablet, Rfl: 0 No current facility-administered medications for this visit.  Facility-Administered Medications Ordered in Other Visits:    heparin lock flush 100 UNIT/ML injection, , , ,    heparin lock flush 100 UNIT/ML injection, , , ,    heparin lock flush 100 UNIT/ML injection, , , ,    heparin lock flush 100 unit/mL, 500 Units, Intravenous, Once, Sindy Guadeloupe, MD   sodium chloride flush (NS) 0.9 % injection 10 mL, 10 mL, Intravenous, Once, Sindy Guadeloupe, MD   sodium chloride flush (NS) 0.9 % injection 10 mL, 10 mL, Intravenous, PRN, Sindy Guadeloupe, MD   sodium chloride flush (NS) 0.9 % injection 10 mL, 10 mL, Intravenous, PRN, Sindy Guadeloupe, MD, 10 mL at 08/21/21 0841  Physical exam:  Vitals:   05/30/22 1313 05/30/22 1317  BP: (!) 189/85 (!) 181/81  Pulse: (!) 54 (!) 49  Resp: 18   Temp: 97.9 F (36.6 C)   TempSrc: Tympanic   SpO2: 99%   Weight: 198 lb 4.8 oz (89.9 kg)   Height: 6' (1.829 m)    Physical Exam Cardiovascular:     Rate and Rhythm: Normal rate and regular rhythm.     Heart sounds: Normal heart sounds.  Pulmonary:     Effort: Pulmonary effort is normal.     Breath sounds: Normal breath sounds.  Abdominal:     General: Bowel sounds are normal.     Palpations: Abdomen is soft.  Skin:    General: Skin is warm and dry.  Neurological:     Mental Status: He is alert and oriented to person, place, and time.         Latest Ref Rng & Units 05/14/2022    1:04 PM  CMP  Glucose 70 - 99 mg/dL 116   BUN 8 - 23 mg/dL 17   Creatinine 0.61 - 1.24 mg/dL 0.79   Sodium 135 - 145 mmol/L 133   Potassium 3.5 - 5.1 mmol/L 3.9   Chloride 98 - 111 mmol/L 99   CO2 22 - 32 mmol/L 25   Calcium 8.9 - 10.3 mg/dL 8.5   Total Protein 6.5 - 8.1 g/dL 6.9   Total Bilirubin 0.3 - 1.2 mg/dL 0.5   Alkaline Phos 38 - 126 U/L 79   AST 15 - 41 U/L 33   ALT 0 - 44 U/L 23        Latest Ref Rng & Units 05/14/2022    1:04 PM  CBC  WBC 4.0 - 10.5 K/uL 9.9   Hemoglobin 13.0 - 17.0 g/dL 14.7   Hematocrit 39.0 - 52.0 % 43.0   Platelets 150 - 400 K/uL 297     No images are attached to the encounter.  CT CHEST ABDOMEN PELVIS W CONTRAST  Result Date: 05/14/2022 CLINICAL DATA:  Urothelial neoplasm. Restaging. * Tracking Code: BO * EXAM: CT CHEST, ABDOMEN, AND PELVIS WITH CONTRAST TECHNIQUE: Multidetector CT imaging of the chest, abdomen and pelvis was performed following the standard protocol during bolus administration of intravenous contrast. RADIATION DOSE REDUCTION: This exam was performed according to the departmental dose-optimization program which includes automated exposure control, adjustment of the mA and/or kV according to patient size and/or use of iterative reconstruction technique. CONTRAST:  44mL OMNIPAQUE IOHEXOL 300 MG/ML  SOLN COMPARISON:  CT 03/14/2022 and older FINDINGS: CT CHEST FINDINGS Cardiovascular: Right upper chest port. Trace pericardial effusion. The heart is nonenlarged.  Coronary artery calcifications are seen. The thoracic aorta has a normal course and caliber with mild atherosclerotic plaque. Mediastinum/Nodes: Normal caliber thoracic esophagus. No specific abnormal lymph node enlargement identified in the axillary region, hilum or mediastinum. Preserved thyroid gland. Lungs/Pleura: Is some linear opacity seen dependently along the lungs likely scar or atelectasis. Apical pleural thickening. No pneumothorax or effusion. Multiple lung nodules are once again identified. Specific lesions will be followed for continuity. The posterior right upper lobe lung nodule which previously measured 5 mm, today on series 3, image 43 measures 9 by 8 mm. The nodule in the medial aspect of the inferior right upper lobe which previously measured 13 mm, today on series 3, image 82 measures 13 by 9 mm, similar. The 10 mm nodule in the right lower lobe anteriorly, today on  image 85 of series 3 measures 16 by 17 mm. The 12 mm left lower lobe nodule on the prior examination abutting the interlobar fissure, today on image 93 of series 3 measures 13 by 12 mm, slightly larger. 2 mm left lower lobe nodule, today on image 88 of series 3 measures 3 mm. There is also developing new nodular areas in the anterior right upper lobe on image 44 of series 3. Musculoskeletal: Slight curvature of the spine. Mild degenerative changes. CT ABDOMEN PELVIS FINDINGS Hepatobiliary: Mild fatty liver infiltration. Patent portal vein. No space-occupying liver lesion. Dependent stones in the gallbladder. Pancreas: Global atrophy of the pancreas without mass lesion except for a cystic lesion along the uncinate process extending inferiorly. Previously measured 10 mm and today 11 mm on series 2, image 82. There is also small lesion superiorly along the midbody on series 2 image 68 measuring 12 by 8 mm, also unchanged in retrospect. Few other small foci identified. Spleen: Normal in size without focal abnormality. Adrenals/Urinary Tract: Adrenal glands are preserved. There is mild bilateral renal atrophy. No obvious enhancing mass or collecting system dilatation. There is a Bosniak 1 lower pole anterior right-sided renal cystic lesion once again measuring 4.2 cm. The ureters have a normal course and caliber down to the bladder. Preserved contours of the bladder with a posterior left-sided bladder diverticulum once again identified. Stomach/Bowel: Large bowel is of normal course and caliber with scattered colonic stool. Normal retrocecal appendix. Few sigmoid colon diverticula. There is a redundant course of the colon with some lucent transverse colon extending between the anterior liver margin in the anterior abdominal wall. The stomach is nondilated. Small bowel is nondilated. Vascular/Lymphatic: Normal caliber aorta and IVC with scattered vascular calcifications. Once again there is an abnormal right-sided pelvic  sidewall lymph node. On series 2, image 116 today this measures 2.3 by 1.7 cm. Going back to the prior when measured in the same fashion as today's technique this would have measured 2.2 1.5 cm, similar. Reproductive: Prostate is unremarkable. Other: Mild anasarca. Musculoskeletal: Degenerative changes seen of the spine and pelvis. Transitional lumbosacral segment. IMPRESSION: Overall slight interval increase in size of multiple lung nodules. Essentially stable right-sided pelvic sidewall lymph node. No new areas of metastatic disease seen today. Multiple small cystic lesions along the pancreas. Gallstones. Colonic diverticula. Fatty liver infiltration. Electronically Signed   By: Jill Side M.D.   On: 05/14/2022 11:03   MR Brain W and Wo Contrast  Result Date: 05/07/2022 CLINICAL DATA:  Hemorrhagic mass.  History of bladder cancer. EXAM: MRI HEAD WITHOUT AND WITH CONTRAST TECHNIQUE: Multiplanar, multiecho pulse sequences of the brain and surrounding structures were obtained without and with  intravenous contrast. CONTRAST:  4mL GADAVIST GADOBUTROL 1 MMOL/ML IV SOLN COMPARISON:  Same-day CT head, brain MRI 07/12/2021. FINDINGS: Brain: There is a 6.0 cm x 3.5 cm x 4.5 cm cystic lesion in the left occipital lobe with internal hemorrhage as seen on the same-day head CT. The lesion demonstrates thin peripheral enhancement as well as areas of nodular enhancement anteriorly (20-16). There is surrounding vasogenic edema with mass effect resulting in effacement of the left occipital horn and regional sulcal effacement but no midline shift. The 2.4 cm x 1.7 cm heterogeneously enhancing lesion centered in the right cerebellar pontine angle/IAC with mild mass effect on the underlying parenchyma is similar in size but with more heterogeneous internal enhancement. There are no other enhancing lesions. There is no acute territorial infarct. Background parenchymal volume is normal. Small foci of FLAIR signal abnormality in  the supratentorial white matter are nonspecific but likely reflects sequela of mild underlying chronic small-vessel ischemic change. Small remote infarcts are noted in the right cerebellar hemisphere. Vascular: Normal flow voids. Skull and upper cervical spine: Normal marrow signal. Sinuses/Orbits: There is mild mucosal thickening in the paranasal sinuses. Bilateral lens implants are in place. The globes and orbits are otherwise unremarkable. Other: None. IMPRESSION: 1. 6.0 cm hemorrhagic mass in the left occipital lobe most suspicious for solitary metastatic lesion. Regional mass effect results in effacement of the left occipital horn but no midline shift. 2. 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 3. No other lesions identified. Electronically Signed   By: Valetta Mole M.D.   On: 05/07/2022 14:35   CT HEAD WO CONTRAST (5MM)  Result Date: 05/07/2022 CLINICAL DATA:  Generalized weakness.  Memory loss.  Headache. EXAM: CT HEAD WITHOUT CONTRAST TECHNIQUE: Contiguous axial images were obtained from the base of the skull through the vertex without intravenous contrast. RADIATION DOSE REDUCTION: This exam was performed according to the departmental dose-optimization program which includes automated exposure control, adjustment of the mA and/or kV according to patient size and/or use of iterative reconstruction technique. COMPARISON:  MRI 07/12/2021 FINDINGS: Brain: No brainstem abnormality is seen. There are old infarctions in the right cerebellum. 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 No significant change since the prior MRI. Cerebral hemispheres show mild chronic small-vessel ischemic change of the white matter. There is hemorrhagic mass lesion in the left occipital lobe measuring at least 4 cm in diameter.  Hemorrhage within the adjacent brain parenchyma. There is some potential this could represent a subacute hemorrhagic infarction, but mass lesion is more likely. MRI of the brain with without contrast would be recommended. No hydrocephalus. No extra-axial collection. Vascular: There is atherosclerotic calcification of the major vessels at the base of the brain. Skull: Negative Sinuses/Orbits: Clear/normal Other: None IMPRESSION: 1. At least 4 cm hemorrhagic mass lesion in the left occipital lobe with adjacent parenchymal hemorrhage. This could represent a subacute hemorrhagic infarction, but mass lesion is more likely. MRI of the brain with without contrast would be recommended. 2. 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 No significant change since the prior MRI. 3. 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 Mild chronic small-vessel ischemic change of the cerebral hemispheric white matter. 4. Atherosclerotic calcification of the major vessels at the base of the brain. 5. Call report in progress. Electronically Signed   By: Elta Guadeloupe  Shogry M.D.   On: 05/07/2022 10:59     Assessment and plan- Patient is a 89 y.o. male with metastatic urothelial carcinoma here to discuss further management  Patient has progressed on multiple lines of treatment including gemcitabine cisplatin, Padcev, Keytruda.  He did not have any FGFR mutation.  I have reviewed his most recent CT chest abdomen and pelvis images independently and discussed findings with the patient which shows continued mild progression of his bilateral lung nodules which has persisted over the last 2 scans consistent with progressive disease.  Moreover he has developed a solitary occipital lobe met as well.  I did discuss with the patient that he should proceed with palliative radiation for the occipital lobe lesion to prevent any further deterioration in his vision.  However in terms of systemic treatment we are running out of  options at this time.  Single agent chemotherapeutic agents like Alimta could be tried but response rates are low and there is potential risk of side effects at his age.  I therefore feel that we should stop further systemic treatment.  Patient is not willing to accept hospice at this time.  He would like to be hospitalized if need be but he is agreeable to be DNR/DNI.  He does have private insurance which can pay for home care in the future if need be.  I will have NP Vonna Kotyk Borders follow-up with him in about 2 to 3 weeks time for a telephone visit.  He follows up with Dr. Doy Hutching as well who is his primary care doctor.  No follow-up needed with me.  We are not doing any further surveillance scans at this time as it would not change management   Visit Diagnosis 1. 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 Goals of care, counseling/discussion      Dr. Randa Evens, MD, MPH [facility] at [facility] [id number] 05/31/2022 6:27 PM

## 2022-06-02 ENCOUNTER — Ambulatory Visit
Admission: RE | Admit: 2022-06-02 | Discharge: 2022-06-02 | Disposition: A | Discharge status: Home / Self Care | Payer: PPO | Source: Ambulatory Visit | Attending: Radiation Oncology | Admitting: Radiation Oncology

## 2022-06-02 ENCOUNTER — Other Ambulatory Visit: Payer: Self-pay

## 2022-06-02 DIAGNOSIS — C7931 Secondary malignant neoplasm of brain: Secondary | ICD-10-CM | POA: Diagnosis not present

## 2022-06-02 DIAGNOSIS — C679 Malignant neoplasm of bladder, unspecified: Secondary | ICD-10-CM | POA: Diagnosis not present

## 2022-06-02 DIAGNOSIS — Z510 Encounter for antineoplastic radiation therapy: Secondary | ICD-10-CM | POA: Diagnosis not present

## 2022-06-02 LAB — RAD ONC ARIA SESSION SUMMARY
Course Elapsed Days: 0
Plan Fractions Treated to Date: 1
Plan Prescribed Dose Per Fraction: 7 Gy
Plan Total Fractions Prescribed: 5
Plan Total Prescribed Dose: 35 Gy
Reference Point Dosage Given to Date: 7 Gy
Reference Point Session Dosage Given: 7 Gy
Session Number: 1

## 2022-06-03 ENCOUNTER — Other Ambulatory Visit: Payer: Self-pay

## 2022-06-03 ENCOUNTER — Ambulatory Visit
Admission: RE | Admit: 2022-06-03 | Discharge: 2022-06-03 | Disposition: A | Discharge status: Home / Self Care | Payer: PPO | Source: Ambulatory Visit | Attending: Radiation Oncology | Admitting: Radiation Oncology

## 2022-06-03 DIAGNOSIS — C679 Malignant neoplasm of bladder, unspecified: Secondary | ICD-10-CM | POA: Diagnosis not present

## 2022-06-03 DIAGNOSIS — C7931 Secondary malignant neoplasm of brain: Secondary | ICD-10-CM | POA: Diagnosis not present

## 2022-06-03 DIAGNOSIS — Z510 Encounter for antineoplastic radiation therapy: Secondary | ICD-10-CM | POA: Diagnosis not present

## 2022-06-03 LAB — RAD ONC ARIA SESSION SUMMARY
Course Elapsed Days: 1
Plan Fractions Treated to Date: 2
Plan Prescribed Dose Per Fraction: 7 Gy
Plan Total Fractions Prescribed: 5
Plan Total Prescribed Dose: 35 Gy
Reference Point Dosage Given to Date: 14 Gy
Reference Point Session Dosage Given: 7 Gy
Session Number: 2

## 2022-06-04 ENCOUNTER — Inpatient Hospital Stay: Payer: PPO

## 2022-06-04 ENCOUNTER — Other Ambulatory Visit: Payer: Self-pay | Admitting: *Deleted

## 2022-06-04 ENCOUNTER — Ambulatory Visit
Admission: RE | Admit: 2022-06-04 | Discharge: 2022-06-04 | Disposition: A | Discharge status: Home / Self Care | Payer: PPO | Source: Ambulatory Visit | Attending: Radiation Oncology | Admitting: Radiation Oncology

## 2022-06-04 ENCOUNTER — Other Ambulatory Visit: Payer: Self-pay

## 2022-06-04 DIAGNOSIS — C679 Malignant neoplasm of bladder, unspecified: Secondary | ICD-10-CM | POA: Diagnosis not present

## 2022-06-04 DIAGNOSIS — Z510 Encounter for antineoplastic radiation therapy: Secondary | ICD-10-CM | POA: Diagnosis not present

## 2022-06-04 DIAGNOSIS — C7931 Secondary malignant neoplasm of brain: Secondary | ICD-10-CM

## 2022-06-04 LAB — RAD ONC ARIA SESSION SUMMARY
Course Elapsed Days: 2
Plan Fractions Treated to Date: 3
Plan Prescribed Dose Per Fraction: 7 Gy
Plan Total Fractions Prescribed: 5
Plan Total Prescribed Dose: 35 Gy
Reference Point Dosage Given to Date: 21 Gy
Reference Point Session Dosage Given: 7 Gy
Session Number: 3

## 2022-06-04 LAB — CBC (CANCER CENTER ONLY)
HCT: 44.9 % (ref 39.0–52.0)
Hemoglobin: 15.3 g/dL (ref 13.0–17.0)
MCH: 29.7 pg (ref 26.0–34.0)
MCHC: 34.1 g/dL (ref 30.0–36.0)
MCV: 87.2 fL (ref 80.0–100.0)
Platelet Count: 204 10*3/uL (ref 150–400)
RBC: 5.15 MIL/uL (ref 4.22–5.81)
RDW: 14 % (ref 11.5–15.5)
WBC Count: 11.6 10*3/uL — ABNORMAL HIGH (ref 4.0–10.5)
nRBC: 0 % (ref 0.0–0.2)

## 2022-06-05 ENCOUNTER — Other Ambulatory Visit: Payer: Self-pay

## 2022-06-05 ENCOUNTER — Ambulatory Visit
Admission: RE | Admit: 2022-06-05 | Discharge: 2022-06-05 | Disposition: A | Discharge status: Home / Self Care | Payer: PPO | Source: Ambulatory Visit | Attending: Radiation Oncology | Admitting: Radiation Oncology

## 2022-06-05 DIAGNOSIS — Z510 Encounter for antineoplastic radiation therapy: Secondary | ICD-10-CM | POA: Diagnosis not present

## 2022-06-05 DIAGNOSIS — C679 Malignant neoplasm of bladder, unspecified: Secondary | ICD-10-CM | POA: Diagnosis not present

## 2022-06-05 DIAGNOSIS — C7931 Secondary malignant neoplasm of brain: Secondary | ICD-10-CM | POA: Diagnosis not present

## 2022-06-05 LAB — RAD ONC ARIA SESSION SUMMARY
Course Elapsed Days: 3
Plan Fractions Treated to Date: 4
Plan Prescribed Dose Per Fraction: 7 Gy
Plan Total Fractions Prescribed: 5
Plan Total Prescribed Dose: 35 Gy
Reference Point Dosage Given to Date: 28 Gy
Reference Point Session Dosage Given: 7 Gy
Session Number: 4

## 2022-06-06 ENCOUNTER — Ambulatory Visit
Admission: RE | Admit: 2022-06-06 | Discharge: 2022-06-06 | Disposition: A | Discharge status: Home / Self Care | Payer: PPO | Source: Ambulatory Visit | Attending: Radiation Oncology | Admitting: Radiation Oncology

## 2022-06-06 ENCOUNTER — Other Ambulatory Visit: Payer: Self-pay | Admitting: *Deleted

## 2022-06-06 ENCOUNTER — Other Ambulatory Visit: Payer: Self-pay

## 2022-06-06 DIAGNOSIS — C7931 Secondary malignant neoplasm of brain: Secondary | ICD-10-CM | POA: Diagnosis not present

## 2022-06-06 DIAGNOSIS — Z510 Encounter for antineoplastic radiation therapy: Secondary | ICD-10-CM | POA: Diagnosis not present

## 2022-06-06 DIAGNOSIS — C679 Malignant neoplasm of bladder, unspecified: Secondary | ICD-10-CM | POA: Diagnosis not present

## 2022-06-06 LAB — RAD ONC ARIA SESSION SUMMARY
Course Elapsed Days: 4
Plan Fractions Treated to Date: 5
Plan Prescribed Dose Per Fraction: 7 Gy
Plan Total Fractions Prescribed: 5
Plan Total Prescribed Dose: 35 Gy
Reference Point Dosage Given to Date: 35 Gy
Reference Point Session Dosage Given: 7 Gy
Session Number: 5

## 2022-06-06 MED ORDER — DEXAMETHASONE 2 MG PO TABS: 2.0000 mg | ORAL_TABLET | Freq: Every day | ORAL | 0 refills | Status: DC

## 2022-06-09 ENCOUNTER — Encounter: Payer: Self-pay | Admitting: *Deleted

## 2022-06-13 ENCOUNTER — Telehealth: Payer: Self-pay | Admitting: *Deleted

## 2022-06-13 NOTE — Progress Notes (Signed)
  Care Coordination Note  06/13/2022 Name: YADER POLMAN MRN: [id number] DOB: 12/08/1931  Lahoma Crocker is a 89 y.o. year old male who is a primary care patient of Judithann Sheen, Duane Lope, MD and is actively engaged with the care management team. I reached out to Lahoma Crocker by phone today to assist with re-scheduling a follow up visit with the RN Case Manager  Follow up plan: Pt declines to reschedule at this time - says healthcare needs are being met. Contact info given to reach out again when needed. Pt appreciative   Burman Nieves, [facility] Care Coordination Care Guide Direct Dial: [phone]

## 2022-06-18 ENCOUNTER — Inpatient Hospital Stay: Payer: PPO | Attending: Oncology | Admitting: Hospice and Palliative Medicine

## 2022-06-18 DIAGNOSIS — Z515 Encounter for palliative care: Secondary | ICD-10-CM

## 2022-06-18 DIAGNOSIS — C669 Malignant neoplasm of unspecified ureter: Secondary | ICD-10-CM

## 2022-06-18 NOTE — Progress Notes (Signed)
Virtual Visit via Telephone Note  I connected with Anthony Gonzales on 06/18/22 at  2:00 PM EDT by telephone and verified that I am speaking with the correct person using two identifiers.  Location: Patient: Home Provider: Clinic   I discussed the limitations, risks, security and privacy concerns of performing an evaluation and management service by telephone and the availability of in person appointments. I also discussed with the patient that there may be a patient responsible charge related to this service. The patient expressed understanding and agreed to proceed.   History of Present Illness: Palliative Care consult requested for this 89 y.o. male with multiple medical problems including metastatic urothelial carcinoma widely metastatic including to bone.  Patient has had progression of multiple previous lines of treatment.  He is no longer felt to be a treatment candidate.  Palliative care was consulted to address goals.   Observations/Objective: I called and spoke with patient by phone.  He initially stated that he was doing well without any significant changes but then describes a slow progression of weakness over the past month or so.  He is now utilizing a walker to ambulate.  He describes an episode where he has had difficulty standing and had to call family for assistance.  At this point, he is still able to maintain most of his ADLs.  Patient describes his recent visit with Dr. Smith Robert and recognizes that he is no longer felt to be a treatment candidate.  He verbalized appreciation with his previous cancer care.  We discussed the option of hospice involvement and patient stated that he would be in agreement as he feels like it is probably time for him to get more help at home.  Assessment and Plan: Stage IV urothelial carcinoma -referral to hospice  Follow Up Instructions: As needed   I discussed the assessment and treatment plan with the patient. The patient was provided an  opportunity to ask questions and all were answered. The patient agreed with the plan and demonstrated an understanding of the instructions.   The patient was advised to call back or seek an in-person evaluation if the symptoms worsen or if the condition fails to improve as anticipated.  I provided 15 minutes of non-face-to-face time during this encounter.   Malachy Moan, NP

## 2022-07-03 ENCOUNTER — Telehealth: Payer: Self-pay | Admitting: *Deleted

## 2022-07-03 NOTE — Telephone Encounter (Signed)
CAll from [facility] with Hospice reporting that patient has developed a re painful warm to touch on his scalp. The rash is no where else and he has not changed soaps shampoos or anything else. She is asking what he can use for it, Benadryl, Steroid cream... ?

## 2022-07-09 ENCOUNTER — Ambulatory Visit
Admission: RE | Admit: 2022-07-09 | Discharge: 2022-07-09 | Disposition: A | Discharge status: Home / Self Care | Source: Ambulatory Visit | Attending: Radiation Oncology | Admitting: Radiation Oncology

## 2022-07-09 ENCOUNTER — Encounter: Payer: Self-pay | Admitting: Radiation Oncology

## 2022-07-09 VITALS — BP 139/88 | HR 64 | Temp 98.4°F | Resp 16

## 2022-07-09 DIAGNOSIS — C7931 Secondary malignant neoplasm of brain: Secondary | ICD-10-CM | POA: Insufficient documentation

## 2022-07-09 NOTE — Progress Notes (Signed)
Radiation Oncology Follow up Note  Name: Anthony Gonzales   Date:   07/09/2022 MRN:  [id number] DOB: 05-24-1931    This 89 y.o. male presents to the clinic today for 1 month follow-up status post hypofractionated radiation therapy for large solitary brain metastasis.  From known stage IV urothelial cancer  REFERRING PROVIDER: Marguarite Arbour, MD  HPI: Patient is an 89 year old male now out 1 month having completed hypofractionated course of radiation therapy to a large brain metastasis in patient with known stage IV urothelial carcinoma.  Seen today in follow-up he is doing fairly well he did have some sort of allergic reaction with a body rash that is responding to prednisone.  His vision has stabilized he has no acute neurologic findings.  He is currently under hospice care.  He has known pelvic and lung nodules which are slightly progressing.  COMPLICATIONS OF TREATMENT: none  FOLLOW UP COMPLIANCE: keeps appointments   PHYSICAL EXAM:  BP 139/88 (BP Location: Right Arm, Patient Position: Sitting, Cuff Size: Normal)   Pulse 64   Temp 98.4 F (36.9 C) (Tympanic)   Resp 16  Frail-appearing elderly male in NAD.  No acute neurologic findings are noted.  Does have a receding maculopapular type rash over his much of his torso.  Well-developed well-nourished patient in NAD. HEENT reveals PERLA, EOMI, discs not visualized.  Oral cavity is clear. No oral mucosal lesions are identified. Neck is clear without evidence of cervical or supraclavicular adenopathy. Lungs are clear to A&P. Cardiac examination is essentially unremarkable with regular rate and rhythm without murmur rub or thrill. Abdomen is benign with no organomegaly or masses noted. Motor sensory and DTR levels are equal and symmetric in the upper and lower extremities. Cranial nerves II through XII are grossly intact. Proprioception is intact. No peripheral adenopathy or edema is identified. No motor or sensory levels are noted. Crude  visual fields are within normal range.  RADIOLOGY RESULTS: No current films for review  PLAN: Present time will turn follow-up care over to hospice.  I be happy to reevaluate him should any further palliative treatment be indicated.  Patient knows to call with any concerns.  I would like to take this opportunity to thank you for allowing me to participate in the care of your patient.Carmina Miller, MD

## 2022-07-11 ENCOUNTER — Telehealth: Payer: Self-pay | Admitting: *Deleted

## 2022-07-11 NOTE — Telephone Encounter (Signed)
Anthony Gonzales called wanting Anthony Gonzales, NO to know that patient completed his first round of prednisone and has started feeling bad again and so per previous conversation, he will be starting another round of prednisone

## 2022-07-22 ENCOUNTER — Other Ambulatory Visit: Payer: Self-pay | Admitting: *Deleted

## 2022-07-22 MED ORDER — DIAZEPAM 5 MG PO TABS: ORAL_TABLET | ORAL | 2 refills | Status: DC

## 2022-08-12 ENCOUNTER — Other Ambulatory Visit: Payer: Self-pay | Admitting: *Deleted

## 2022-08-12 NOTE — Telephone Encounter (Signed)
[facility] pharmacy sent request for Keppra 500 mg. Pt is hospice. Are you refilling this for patient?

## 2022-08-13 MED ORDER — LEVETIRACETAM 500 MG PO TABS: 500.0000 mg | ORAL_TABLET | Freq: Two times a day (BID) | ORAL | 3 refills | Status: DC

## 2022-10-07 ENCOUNTER — Other Ambulatory Visit: Payer: Self-pay | Admitting: Hospice and Palliative Medicine

## 2022-10-07 MED ORDER — DIAZEPAM 5 MG PO TABS: ORAL_TABLET | ORAL | 2 refills | Status: DC

## 2022-10-07 MED ORDER — PANTOPRAZOLE SODIUM 40 MG PO TBEC: 40.0000 mg | DELAYED_RELEASE_TABLET | Freq: Every day | ORAL | 3 refills | Status: DC

## 2022-10-07 MED ORDER — METOPROLOL SUCCINATE ER 50 MG PO TB24: 50.0000 mg | ORAL_TABLET | Freq: Every day | ORAL | 3 refills | Status: DC

## 2022-10-07 MED ORDER — PREDNISONE 10 MG PO TABS: 10.0000 mg | ORAL_TABLET | Freq: Every day | ORAL | 1 refills | Status: DC

## 2022-10-07 MED ORDER — HYDROXYZINE HCL 10 MG PO TABS: 10.0000 mg | ORAL_TABLET | Freq: Three times a day (TID) | ORAL | 1 refills | Status: DC | PRN

## 2022-10-07 NOTE — Progress Notes (Signed)
Hospice nurse, Juliette Alcide, called and stated patient was overall doing better after recent treatment for COVID.  She requested refills of diazepam, pantoprazole, prednisone, metoprolol and hydroxyzine.

## 2022-11-29 ENCOUNTER — Other Ambulatory Visit: Payer: Self-pay | Admitting: Hospice and Palliative Medicine

## 2023-02-12 ENCOUNTER — Other Ambulatory Visit: Payer: Self-pay | Admitting: Oncology

## 2023-02-12 DIAGNOSIS — N401 Enlarged prostate with lower urinary tract symptoms: Secondary | ICD-10-CM

## 2023-02-27 ENCOUNTER — Other Ambulatory Visit: Payer: Self-pay | Admitting: Oncology

## 2023-03-03 ENCOUNTER — Other Ambulatory Visit: Payer: Self-pay | Admitting: Hospice and Palliative Medicine

## 2023-03-03 ENCOUNTER — Other Ambulatory Visit: Payer: Self-pay | Admitting: Radiation Oncology

## 2023-03-07 ENCOUNTER — Other Ambulatory Visit: Payer: Self-pay | Admitting: Hospice and Palliative Medicine

## 2023-03-27 ENCOUNTER — Other Ambulatory Visit: Payer: Self-pay | Admitting: Hospice and Palliative Medicine

## 2023-03-27 ENCOUNTER — Other Ambulatory Visit: Payer: Self-pay | Admitting: Radiation Oncology

## 2023-03-27 NOTE — Telephone Encounter (Signed)
No longer needed

## 2023-05-19 ENCOUNTER — Other Ambulatory Visit: Payer: Self-pay

## 2023-05-19 MED ORDER — DIAZEPAM 5 MG PO TABS: ORAL_TABLET | ORAL | 2 refills | Status: DC

## 2023-05-19 NOTE — Telephone Encounter (Signed)
 Refill request sent to [name], NP in Dr. Clova Morlock Robert and Franklyn Lor, NP absence.

## 2023-05-21 MED ORDER — PREDNISONE 10 MG PO TABS: 10.0000 mg | ORAL_TABLET | Freq: Every day | ORAL | 1 refills | Status: DC

## 2023-06-23 ENCOUNTER — Other Ambulatory Visit: Payer: Self-pay | Admitting: *Deleted

## 2023-06-23 MED ORDER — OXYCODONE HCL 5 MG PO TABS: 5.0000 mg | ORAL_TABLET | ORAL | 0 refills | Status: DC | PRN

## 2023-06-23 NOTE — Telephone Encounter (Signed)
 Hospice care says that the pt. Has pain right thigh and hip and the tylenol is not helping at all, spoke to [place] and will start with oxycodone 5 mg and every 4 hours as needed for pain.

## 2023-06-30 ENCOUNTER — Other Ambulatory Visit: Payer: Self-pay | Admitting: Hospice and Palliative Medicine

## 2023-06-30 MED ORDER — TRAMADOL HCL 50 MG PO TABS: 25.0000 mg | ORAL_TABLET | Freq: Two times a day (BID) | ORAL | 0 refills | Status: DC | PRN

## 2023-06-30 NOTE — Progress Notes (Signed)
 I spoke with patient's hospice nurse, Cody Das.  Patient having left upper leg pain with no edema.  Able to bear weight.  He tried taking oxycodone  but it led to him feeling loopy and patient is not interested in retrying it.  Hospice RN asked about tramadol .  Will send Rx to pharmacy.

## 2023-07-07 ENCOUNTER — Telehealth: Payer: Self-pay | Admitting: *Deleted

## 2023-07-07 MED ORDER — METOPROLOL SUCCINATE ER 50 MG PO TB24: 50.0000 mg | ORAL_TABLET | Freq: Every day | ORAL | 1 refills | Status: DC

## 2023-07-07 NOTE — Telephone Encounter (Signed)
 Need refill of  metoprolol  succinate  and I sent to Randy Buttery for her to approve it  and then he did get tramadol  was sent 06/30/2023. I left this message to Ceslia with [facility]

## 2023-07-24 ENCOUNTER — Telehealth: Payer: Self-pay | Admitting: *Deleted

## 2023-07-24 NOTE — Telephone Encounter (Signed)
 Eldonna Greenspan from [facility] care was calling on Mr. Anthony Gonzales he had fallen 1 week ago on Monday and he had back pains so he already had Valium  5 mg that he uses at sleep time.  His pain was getting a little bit worse so Eldonna Greenspan told him that he could have 1 in the daytime and then do 1 at night also of the Valium .  The patient told Eldonna Greenspan that the 1 at night does not cover him through the whole night.  She wanted to know if Dr. Randy Buttery wants him to take 3 a day Valium  till he gets over this pain from the fall and go back to the Valium  once a day.  Sims Duck that I would asked Dr. Randy Buttery and see what she says.

## 2023-07-24 NOTE — Telephone Encounter (Signed)
 They will need to monitor him closely for confusion/ fall risk on valium  but ok with 5 mg TID for short term

## 2023-07-27 NOTE — Telephone Encounter (Signed)
 Per Dr. Randy Buttery "They will need to monitor him closely for confusion/ fall risk on valium  but ok with 5 mg TID for short term".  Outbound call to [facility], detailed voice message left directly indicating the above message.  No further follow up needed.

## 2023-07-28 ENCOUNTER — Other Ambulatory Visit: Payer: Self-pay | Admitting: *Deleted

## 2023-07-28 MED ORDER — DIAZEPAM 5 MG PO TABS: ORAL_TABLET | ORAL | 0 refills | Status: DC

## 2023-07-28 NOTE — Telephone Encounter (Signed)
 Cecily from a floor care called to see about refilling the patient's Valium -diazepam .  He takes 5 mg and they increased to 4 times a day since he had hurt himself when he fell.

## 2023-08-04 ENCOUNTER — Telehealth: Payer: Self-pay | Admitting: *Deleted

## 2023-08-04 MED ORDER — PROCHLORPERAZINE MALEATE 10 MG PO TABS: 10.0000 mg | ORAL_TABLET | Freq: Four times a day (QID) | ORAL | 0 refills | Status: DC | PRN

## 2023-08-04 MED ORDER — PREDNISONE 10 MG PO TABS: 10.0000 mg | ORAL_TABLET | Freq: Every day | ORAL | 1 refills | Status: DC

## 2023-08-04 NOTE — Telephone Encounter (Signed)
 Cecily called to say that the prednisone  10 mg wants to be refilled and Compazine  as needed for nausea

## 2023-08-06 ENCOUNTER — Other Ambulatory Visit: Payer: Self-pay | Admitting: Hospice and Palliative Medicine

## 2023-08-06 MED ORDER — MORPHINE SULFATE (CONCENTRATE) 10 MG /0.5 ML PO SOLN: 5.0000 mg | ORAL | 0 refills | Status: DC | PRN

## 2023-08-06 NOTE — Progress Notes (Signed)
 Received a call from patient's hospice nurse, Cesalea.  Reportedly, patient is from declining.  Having more labored breathing.  She requested order for morphine  for comfort.  Will send Rx to pharmacy.

## 2023-08-11 ENCOUNTER — Other Ambulatory Visit: Payer: Self-pay | Admitting: Hospice and Palliative Medicine

## 2023-09-08 DEATH — deceased
